# Patient Record
Sex: Male | Born: 1955
Health system: Southern US, Community
[De-identification: ages and names within clinical notes are randomized; demographics above are authoritative.]

## PROBLEM LIST (undated history)

## (undated) DIAGNOSIS — Z930 Tracheostomy status: Secondary | ICD-10-CM

## (undated) DIAGNOSIS — Z9289 Personal history of other medical treatment: Secondary | ICD-10-CM

## (undated) DIAGNOSIS — G8194 Hemiplegia, unspecified affecting left nondominant side: Secondary | ICD-10-CM

## (undated) DIAGNOSIS — W19XXXA Unspecified fall, initial encounter: Secondary | ICD-10-CM

## (undated) DIAGNOSIS — I513 Intracardiac thrombosis, not elsewhere classified: Secondary | ICD-10-CM

## (undated) DIAGNOSIS — R1310 Dysphagia, unspecified: Secondary | ICD-10-CM

## (undated) DIAGNOSIS — I5022 Chronic systolic (congestive) heart failure: Secondary | ICD-10-CM

## (undated) DIAGNOSIS — F419 Anxiety disorder, unspecified: Secondary | ICD-10-CM

## (undated) DIAGNOSIS — E46 Unspecified protein-calorie malnutrition: Secondary | ICD-10-CM

## (undated) DIAGNOSIS — E119 Type 2 diabetes mellitus without complications: Secondary | ICD-10-CM

## (undated) DIAGNOSIS — B1920 Unspecified viral hepatitis C without hepatic coma: Secondary | ICD-10-CM

## (undated) DIAGNOSIS — F329 Major depressive disorder, single episode, unspecified: Secondary | ICD-10-CM

## (undated) DIAGNOSIS — I10 Essential (primary) hypertension: Secondary | ICD-10-CM

## (undated) DIAGNOSIS — D649 Anemia, unspecified: Secondary | ICD-10-CM

## (undated) DIAGNOSIS — J9600 Acute respiratory failure, unspecified whether with hypoxia or hypercapnia: Secondary | ICD-10-CM

## (undated) DIAGNOSIS — I639 Cerebral infarction, unspecified: Secondary | ICD-10-CM

## (undated) DIAGNOSIS — J42 Unspecified chronic bronchitis: Secondary | ICD-10-CM

## (undated) DIAGNOSIS — I219 Acute myocardial infarction, unspecified: Secondary | ICD-10-CM

## (undated) DIAGNOSIS — J9690 Respiratory failure, unspecified, unspecified whether with hypoxia or hypercapnia: Secondary | ICD-10-CM

## (undated) DIAGNOSIS — E785 Hyperlipidemia, unspecified: Secondary | ICD-10-CM

## (undated) DIAGNOSIS — Z8739 Personal history of other diseases of the musculoskeletal system and connective tissue: Secondary | ICD-10-CM

## (undated) DIAGNOSIS — I255 Ischemic cardiomyopathy: Secondary | ICD-10-CM

## (undated) DIAGNOSIS — Z9981 Dependence on supplemental oxygen: Secondary | ICD-10-CM

## (undated) DIAGNOSIS — J189 Pneumonia, unspecified organism: Secondary | ICD-10-CM

## (undated) DIAGNOSIS — F32A Depression, unspecified: Secondary | ICD-10-CM

## (undated) DIAGNOSIS — F0390 Unspecified dementia without behavioral disturbance: Secondary | ICD-10-CM

## (undated) DIAGNOSIS — S3600XA Unspecified injury of spleen, initial encounter: Secondary | ICD-10-CM

## (undated) DIAGNOSIS — I2510 Atherosclerotic heart disease of native coronary artery without angina pectoris: Secondary | ICD-10-CM

## (undated) HISTORY — DX: Major depressive disorder, single episode, unspecified: F32.9

## (undated) HISTORY — PX: CARDIAC CATHETERIZATION: SHX172

## (undated) HISTORY — DX: Tracheostomy status: Z93.0

## (undated) HISTORY — DX: Unspecified protein-calorie malnutrition: E46

## (undated) HISTORY — DX: Essential (primary) hypertension: I10

## (undated) HISTORY — PX: ABDOMINAL HERNIA REPAIR: SHX539

## (undated) HISTORY — DX: Acute respiratory failure, unspecified whether with hypoxia or hypercapnia: J96.00

## (undated) HISTORY — DX: Intracardiac thrombosis, not elsewhere classified: I51.3

## (undated) HISTORY — PX: HERNIA REPAIR: SHX51

## (undated) HISTORY — DX: Depression, unspecified: F32.A

---

## 1997-10-24 HISTORY — PX: CORONARY ANGIOPLASTY WITH STENT PLACEMENT: SHX49

## 2014-11-19 DIAGNOSIS — I714 Abdominal aortic aneurysm, without rupture: Secondary | ICD-10-CM | POA: Diagnosis not present

## 2014-11-19 DIAGNOSIS — F419 Anxiety disorder, unspecified: Secondary | ICD-10-CM | POA: Diagnosis not present

## 2014-11-19 DIAGNOSIS — R1010 Upper abdominal pain, unspecified: Secondary | ICD-10-CM | POA: Diagnosis not present

## 2014-11-21 DIAGNOSIS — F17200 Nicotine dependence, unspecified, uncomplicated: Secondary | ICD-10-CM | POA: Diagnosis not present

## 2014-11-21 DIAGNOSIS — I81 Portal vein thrombosis: Secondary | ICD-10-CM | POA: Diagnosis not present

## 2014-11-21 DIAGNOSIS — B1920 Unspecified viral hepatitis C without hepatic coma: Secondary | ICD-10-CM | POA: Diagnosis not present

## 2014-11-21 DIAGNOSIS — I10 Essential (primary) hypertension: Secondary | ICD-10-CM | POA: Diagnosis not present

## 2014-11-21 DIAGNOSIS — I2510 Atherosclerotic heart disease of native coronary artery without angina pectoris: Secondary | ICD-10-CM | POA: Diagnosis not present

## 2014-11-21 DIAGNOSIS — K7460 Unspecified cirrhosis of liver: Secondary | ICD-10-CM | POA: Diagnosis not present

## 2014-11-21 DIAGNOSIS — R1013 Epigastric pain: Secondary | ICD-10-CM | POA: Diagnosis not present

## 2014-11-21 DIAGNOSIS — E118 Type 2 diabetes mellitus with unspecified complications: Secondary | ICD-10-CM | POA: Diagnosis not present

## 2014-11-21 DIAGNOSIS — I700 Atherosclerosis of aorta: Secondary | ICD-10-CM | POA: Diagnosis not present

## 2014-11-21 DIAGNOSIS — R109 Unspecified abdominal pain: Secondary | ICD-10-CM | POA: Diagnosis not present

## 2014-11-21 DIAGNOSIS — Z8619 Personal history of other infectious and parasitic diseases: Secondary | ICD-10-CM | POA: Diagnosis not present

## 2014-11-21 DIAGNOSIS — F419 Anxiety disorder, unspecified: Secondary | ICD-10-CM | POA: Diagnosis present

## 2014-11-21 DIAGNOSIS — K550 Acute vascular disorders of intestine: Secondary | ICD-10-CM | POA: Diagnosis not present

## 2014-11-21 DIAGNOSIS — Z8249 Family history of ischemic heart disease and other diseases of the circulatory system: Secondary | ICD-10-CM | POA: Diagnosis not present

## 2014-11-21 DIAGNOSIS — J449 Chronic obstructive pulmonary disease, unspecified: Secondary | ICD-10-CM | POA: Diagnosis present

## 2014-11-21 DIAGNOSIS — E119 Type 2 diabetes mellitus without complications: Secondary | ICD-10-CM | POA: Diagnosis not present

## 2014-11-21 DIAGNOSIS — Z79899 Other long term (current) drug therapy: Secondary | ICD-10-CM | POA: Diagnosis not present

## 2014-11-21 DIAGNOSIS — Z8673 Personal history of transient ischemic attack (TIA), and cerebral infarction without residual deficits: Secondary | ICD-10-CM | POA: Diagnosis not present

## 2014-11-21 DIAGNOSIS — Z7982 Long term (current) use of aspirin: Secondary | ICD-10-CM | POA: Diagnosis not present

## 2014-11-21 DIAGNOSIS — F17210 Nicotine dependence, cigarettes, uncomplicated: Secondary | ICD-10-CM | POA: Diagnosis present

## 2014-11-21 DIAGNOSIS — E785 Hyperlipidemia, unspecified: Secondary | ICD-10-CM | POA: Diagnosis not present

## 2014-11-28 DIAGNOSIS — K551 Chronic vascular disorders of intestine: Secondary | ICD-10-CM | POA: Diagnosis not present

## 2014-11-28 DIAGNOSIS — I2541 Coronary artery aneurysm: Secondary | ICD-10-CM | POA: Diagnosis not present

## 2014-12-02 DIAGNOSIS — E119 Type 2 diabetes mellitus without complications: Secondary | ICD-10-CM | POA: Diagnosis not present

## 2014-12-02 DIAGNOSIS — F39 Unspecified mood [affective] disorder: Secondary | ICD-10-CM | POA: Diagnosis not present

## 2014-12-02 DIAGNOSIS — I2510 Atherosclerotic heart disease of native coronary artery without angina pectoris: Secondary | ICD-10-CM | POA: Diagnosis not present

## 2015-01-04 DIAGNOSIS — E785 Hyperlipidemia, unspecified: Secondary | ICD-10-CM | POA: Diagnosis not present

## 2015-01-04 DIAGNOSIS — Z8619 Personal history of other infectious and parasitic diseases: Secondary | ICD-10-CM | POA: Diagnosis not present

## 2015-01-04 DIAGNOSIS — I214 Non-ST elevation (NSTEMI) myocardial infarction: Secondary | ICD-10-CM | POA: Diagnosis not present

## 2015-01-04 DIAGNOSIS — I517 Cardiomegaly: Secondary | ICD-10-CM | POA: Diagnosis not present

## 2015-01-04 DIAGNOSIS — K7460 Unspecified cirrhosis of liver: Secondary | ICD-10-CM | POA: Diagnosis not present

## 2015-01-04 DIAGNOSIS — I10 Essential (primary) hypertension: Secondary | ICD-10-CM | POA: Diagnosis not present

## 2015-01-04 DIAGNOSIS — I501 Left ventricular failure: Secondary | ICD-10-CM | POA: Diagnosis not present

## 2015-01-04 DIAGNOSIS — J449 Chronic obstructive pulmonary disease, unspecified: Secondary | ICD-10-CM | POA: Diagnosis not present

## 2015-01-04 DIAGNOSIS — Z9114 Patient's other noncompliance with medication regimen: Secondary | ICD-10-CM | POA: Diagnosis present

## 2015-01-04 DIAGNOSIS — B1920 Unspecified viral hepatitis C without hepatic coma: Secondary | ICD-10-CM | POA: Diagnosis present

## 2015-01-04 DIAGNOSIS — D696 Thrombocytopenia, unspecified: Secondary | ICD-10-CM | POA: Diagnosis present

## 2015-01-04 DIAGNOSIS — I255 Ischemic cardiomyopathy: Secondary | ICD-10-CM | POA: Diagnosis not present

## 2015-01-04 DIAGNOSIS — R079 Chest pain, unspecified: Secondary | ICD-10-CM | POA: Diagnosis not present

## 2015-01-04 DIAGNOSIS — F17210 Nicotine dependence, cigarettes, uncomplicated: Secondary | ICD-10-CM | POA: Diagnosis present

## 2015-01-04 DIAGNOSIS — R0789 Other chest pain: Secondary | ICD-10-CM | POA: Diagnosis not present

## 2015-01-04 DIAGNOSIS — I2510 Atherosclerotic heart disease of native coronary artery without angina pectoris: Secondary | ICD-10-CM | POA: Diagnosis present

## 2015-01-04 DIAGNOSIS — I252 Old myocardial infarction: Secondary | ICD-10-CM | POA: Diagnosis not present

## 2015-01-04 DIAGNOSIS — Z8673 Personal history of transient ischemic attack (TIA), and cerebral infarction without residual deficits: Secondary | ICD-10-CM | POA: Diagnosis not present

## 2015-01-04 DIAGNOSIS — I493 Ventricular premature depolarization: Secondary | ICD-10-CM | POA: Diagnosis not present

## 2015-01-16 DIAGNOSIS — R0789 Other chest pain: Secondary | ICD-10-CM | POA: Diagnosis not present

## 2015-01-16 DIAGNOSIS — I214 Non-ST elevation (NSTEMI) myocardial infarction: Secondary | ICD-10-CM | POA: Diagnosis not present

## 2015-01-16 DIAGNOSIS — R0602 Shortness of breath: Secondary | ICD-10-CM | POA: Diagnosis not present

## 2015-01-16 DIAGNOSIS — F17210 Nicotine dependence, cigarettes, uncomplicated: Secondary | ICD-10-CM | POA: Diagnosis present

## 2015-01-16 DIAGNOSIS — Z79899 Other long term (current) drug therapy: Secondary | ICD-10-CM | POA: Diagnosis not present

## 2015-01-16 DIAGNOSIS — I340 Nonrheumatic mitral (valve) insufficiency: Secondary | ICD-10-CM | POA: Diagnosis not present

## 2015-01-16 DIAGNOSIS — B1920 Unspecified viral hepatitis C without hepatic coma: Secondary | ICD-10-CM | POA: Diagnosis present

## 2015-01-16 DIAGNOSIS — I2510 Atherosclerotic heart disease of native coronary artery without angina pectoris: Secondary | ICD-10-CM | POA: Diagnosis not present

## 2015-01-16 DIAGNOSIS — Z7982 Long term (current) use of aspirin: Secondary | ICD-10-CM | POA: Diagnosis not present

## 2015-01-16 DIAGNOSIS — J449 Chronic obstructive pulmonary disease, unspecified: Secondary | ICD-10-CM | POA: Diagnosis not present

## 2015-01-16 DIAGNOSIS — E876 Hypokalemia: Secondary | ICD-10-CM | POA: Diagnosis not present

## 2015-01-16 DIAGNOSIS — I5020 Unspecified systolic (congestive) heart failure: Secondary | ICD-10-CM | POA: Diagnosis not present

## 2015-01-16 DIAGNOSIS — J9811 Atelectasis: Secondary | ICD-10-CM | POA: Diagnosis not present

## 2015-01-16 DIAGNOSIS — I517 Cardiomegaly: Secondary | ICD-10-CM | POA: Diagnosis not present

## 2015-01-16 DIAGNOSIS — I255 Ischemic cardiomyopathy: Secondary | ICD-10-CM | POA: Diagnosis not present

## 2015-01-16 DIAGNOSIS — I10 Essential (primary) hypertension: Secondary | ICD-10-CM | POA: Diagnosis not present

## 2015-01-16 DIAGNOSIS — I5022 Chronic systolic (congestive) heart failure: Secondary | ICD-10-CM | POA: Diagnosis not present

## 2015-01-16 DIAGNOSIS — Z8249 Family history of ischemic heart disease and other diseases of the circulatory system: Secondary | ICD-10-CM | POA: Diagnosis not present

## 2015-01-16 DIAGNOSIS — D696 Thrombocytopenia, unspecified: Secondary | ICD-10-CM | POA: Diagnosis not present

## 2015-01-16 DIAGNOSIS — I25119 Atherosclerotic heart disease of native coronary artery with unspecified angina pectoris: Secondary | ICD-10-CM | POA: Diagnosis not present

## 2015-01-16 DIAGNOSIS — I501 Left ventricular failure: Secondary | ICD-10-CM | POA: Diagnosis not present

## 2015-01-16 DIAGNOSIS — R079 Chest pain, unspecified: Secondary | ICD-10-CM | POA: Diagnosis not present

## 2015-01-16 DIAGNOSIS — E785 Hyperlipidemia, unspecified: Secondary | ICD-10-CM | POA: Diagnosis not present

## 2015-01-16 DIAGNOSIS — Z9861 Coronary angioplasty status: Secondary | ICD-10-CM | POA: Diagnosis not present

## 2015-01-16 DIAGNOSIS — F17200 Nicotine dependence, unspecified, uncomplicated: Secondary | ICD-10-CM | POA: Diagnosis not present

## 2015-01-28 DIAGNOSIS — F419 Anxiety disorder, unspecified: Secondary | ICD-10-CM | POA: Diagnosis not present

## 2015-01-28 DIAGNOSIS — I2510 Atherosclerotic heart disease of native coronary artery without angina pectoris: Secondary | ICD-10-CM | POA: Diagnosis not present

## 2015-01-28 DIAGNOSIS — J410 Simple chronic bronchitis: Secondary | ICD-10-CM | POA: Diagnosis not present

## 2015-01-28 DIAGNOSIS — R7309 Other abnormal glucose: Secondary | ICD-10-CM | POA: Diagnosis not present

## 2015-02-05 DIAGNOSIS — I213 ST elevation (STEMI) myocardial infarction of unspecified site: Secondary | ICD-10-CM | POA: Diagnosis not present

## 2015-02-05 DIAGNOSIS — I2583 Coronary atherosclerosis due to lipid rich plaque: Secondary | ICD-10-CM | POA: Diagnosis not present

## 2015-02-05 DIAGNOSIS — I714 Abdominal aortic aneurysm, without rupture: Secondary | ICD-10-CM | POA: Diagnosis not present

## 2015-02-05 DIAGNOSIS — E785 Hyperlipidemia, unspecified: Secondary | ICD-10-CM | POA: Diagnosis not present

## 2015-02-05 DIAGNOSIS — J449 Chronic obstructive pulmonary disease, unspecified: Secondary | ICD-10-CM | POA: Diagnosis not present

## 2015-02-05 DIAGNOSIS — Z09 Encounter for follow-up examination after completed treatment for conditions other than malignant neoplasm: Secondary | ICD-10-CM | POA: Diagnosis not present

## 2015-02-05 DIAGNOSIS — I2510 Atherosclerotic heart disease of native coronary artery without angina pectoris: Secondary | ICD-10-CM | POA: Diagnosis not present

## 2015-02-05 DIAGNOSIS — E1142 Type 2 diabetes mellitus with diabetic polyneuropathy: Secondary | ICD-10-CM | POA: Diagnosis not present

## 2015-02-05 DIAGNOSIS — I10 Essential (primary) hypertension: Secondary | ICD-10-CM | POA: Diagnosis not present

## 2015-04-29 DIAGNOSIS — R079 Chest pain, unspecified: Secondary | ICD-10-CM | POA: Diagnosis not present

## 2015-04-29 DIAGNOSIS — Z7982 Long term (current) use of aspirin: Secondary | ICD-10-CM | POA: Diagnosis not present

## 2015-04-29 DIAGNOSIS — I714 Abdominal aortic aneurysm, without rupture: Secondary | ICD-10-CM | POA: Diagnosis not present

## 2015-04-29 DIAGNOSIS — I2510 Atherosclerotic heart disease of native coronary artery without angina pectoris: Secondary | ICD-10-CM | POA: Diagnosis not present

## 2015-04-29 DIAGNOSIS — E780 Pure hypercholesterolemia: Secondary | ICD-10-CM | POA: Diagnosis not present

## 2015-06-10 DIAGNOSIS — I2510 Atherosclerotic heart disease of native coronary artery without angina pectoris: Secondary | ICD-10-CM | POA: Diagnosis not present

## 2015-06-10 DIAGNOSIS — I10 Essential (primary) hypertension: Secondary | ICD-10-CM | POA: Diagnosis not present

## 2015-06-10 DIAGNOSIS — Z720 Tobacco use: Secondary | ICD-10-CM | POA: Diagnosis not present

## 2015-06-10 DIAGNOSIS — I714 Abdominal aortic aneurysm, without rupture: Secondary | ICD-10-CM | POA: Diagnosis not present

## 2015-06-10 DIAGNOSIS — R079 Chest pain, unspecified: Secondary | ICD-10-CM | POA: Diagnosis not present

## 2015-06-10 DIAGNOSIS — B1920 Unspecified viral hepatitis C without hepatic coma: Secondary | ICD-10-CM | POA: Diagnosis not present

## 2015-06-10 DIAGNOSIS — I255 Ischemic cardiomyopathy: Secondary | ICD-10-CM | POA: Diagnosis not present

## 2015-06-10 DIAGNOSIS — E780 Pure hypercholesterolemia: Secondary | ICD-10-CM | POA: Diagnosis not present

## 2015-06-10 DIAGNOSIS — Z7982 Long term (current) use of aspirin: Secondary | ICD-10-CM | POA: Diagnosis not present

## 2015-06-10 DIAGNOSIS — R229 Localized swelling, mass and lump, unspecified: Secondary | ICD-10-CM | POA: Diagnosis not present

## 2015-06-13 DIAGNOSIS — F419 Anxiety disorder, unspecified: Secondary | ICD-10-CM | POA: Diagnosis not present

## 2015-06-13 DIAGNOSIS — E559 Vitamin D deficiency, unspecified: Secondary | ICD-10-CM | POA: Diagnosis not present

## 2015-06-13 DIAGNOSIS — Z0000 Encounter for general adult medical examination without abnormal findings: Secondary | ICD-10-CM | POA: Diagnosis not present

## 2015-06-13 DIAGNOSIS — Z79899 Other long term (current) drug therapy: Secondary | ICD-10-CM | POA: Diagnosis not present

## 2015-06-13 DIAGNOSIS — R5383 Other fatigue: Secondary | ICD-10-CM | POA: Diagnosis not present

## 2015-06-13 DIAGNOSIS — M129 Arthropathy, unspecified: Secondary | ICD-10-CM | POA: Diagnosis not present

## 2015-06-13 DIAGNOSIS — Z125 Encounter for screening for malignant neoplasm of prostate: Secondary | ICD-10-CM | POA: Diagnosis not present

## 2015-06-13 DIAGNOSIS — Z13820 Encounter for screening for osteoporosis: Secondary | ICD-10-CM | POA: Diagnosis not present

## 2015-06-13 DIAGNOSIS — G8929 Other chronic pain: Secondary | ICD-10-CM | POA: Diagnosis not present

## 2015-06-13 DIAGNOSIS — M549 Dorsalgia, unspecified: Secondary | ICD-10-CM | POA: Diagnosis not present

## 2015-06-13 DIAGNOSIS — E119 Type 2 diabetes mellitus without complications: Secondary | ICD-10-CM | POA: Diagnosis not present

## 2015-06-13 DIAGNOSIS — R0602 Shortness of breath: Secondary | ICD-10-CM | POA: Diagnosis not present

## 2015-06-13 DIAGNOSIS — F17200 Nicotine dependence, unspecified, uncomplicated: Secondary | ICD-10-CM | POA: Diagnosis not present

## 2015-06-26 DIAGNOSIS — I714 Abdominal aortic aneurysm, without rupture: Secondary | ICD-10-CM | POA: Diagnosis not present

## 2015-06-26 DIAGNOSIS — I998 Other disorder of circulatory system: Secondary | ICD-10-CM | POA: Diagnosis not present

## 2015-06-26 DIAGNOSIS — R0602 Shortness of breath: Secondary | ICD-10-CM | POA: Diagnosis not present

## 2015-06-26 DIAGNOSIS — T50901A Poisoning by unspecified drugs, medicaments and biological substances, accidental (unintentional), initial encounter: Secondary | ICD-10-CM | POA: Diagnosis not present

## 2015-06-26 DIAGNOSIS — E119 Type 2 diabetes mellitus without complications: Secondary | ICD-10-CM | POA: Diagnosis not present

## 2015-06-26 DIAGNOSIS — M1990 Unspecified osteoarthritis, unspecified site: Secondary | ICD-10-CM | POA: Diagnosis not present

## 2015-06-26 DIAGNOSIS — S0990XA Unspecified injury of head, initial encounter: Secondary | ICD-10-CM | POA: Diagnosis not present

## 2015-06-26 DIAGNOSIS — R0600 Dyspnea, unspecified: Secondary | ICD-10-CM | POA: Diagnosis not present

## 2015-06-26 DIAGNOSIS — I509 Heart failure, unspecified: Secondary | ICD-10-CM | POA: Diagnosis not present

## 2015-06-26 DIAGNOSIS — T407X1A Poisoning by cannabis (derivatives), accidental (unintentional), initial encounter: Secondary | ICD-10-CM | POA: Diagnosis not present

## 2015-06-26 DIAGNOSIS — Z8673 Personal history of transient ischemic attack (TIA), and cerebral infarction without residual deficits: Secondary | ICD-10-CM | POA: Diagnosis not present

## 2015-06-26 DIAGNOSIS — R55 Syncope and collapse: Secondary | ICD-10-CM | POA: Diagnosis not present

## 2015-06-26 DIAGNOSIS — M79672 Pain in left foot: Secondary | ICD-10-CM | POA: Diagnosis not present

## 2015-06-26 DIAGNOSIS — F419 Anxiety disorder, unspecified: Secondary | ICD-10-CM | POA: Diagnosis not present

## 2015-06-26 DIAGNOSIS — I252 Old myocardial infarction: Secondary | ICD-10-CM | POA: Diagnosis not present

## 2015-06-26 DIAGNOSIS — T424X1A Poisoning by benzodiazepines, accidental (unintentional), initial encounter: Secondary | ICD-10-CM | POA: Diagnosis not present

## 2015-06-26 DIAGNOSIS — F329 Major depressive disorder, single episode, unspecified: Secondary | ICD-10-CM | POA: Diagnosis not present

## 2015-06-26 DIAGNOSIS — I469 Cardiac arrest, cause unspecified: Secondary | ICD-10-CM | POA: Diagnosis not present

## 2015-06-26 DIAGNOSIS — E785 Hyperlipidemia, unspecified: Secondary | ICD-10-CM | POA: Diagnosis not present

## 2015-06-26 DIAGNOSIS — F17210 Nicotine dependence, cigarettes, uncomplicated: Secondary | ICD-10-CM | POA: Diagnosis not present

## 2015-06-26 DIAGNOSIS — S098XXA Other specified injuries of head, initial encounter: Secondary | ICD-10-CM | POA: Diagnosis not present

## 2015-06-26 DIAGNOSIS — Z885 Allergy status to narcotic agent status: Secondary | ICD-10-CM | POA: Diagnosis not present

## 2015-06-26 DIAGNOSIS — B1920 Unspecified viral hepatitis C without hepatic coma: Secondary | ICD-10-CM | POA: Diagnosis not present

## 2015-06-26 DIAGNOSIS — Z87820 Personal history of traumatic brain injury: Secondary | ICD-10-CM | POA: Diagnosis not present

## 2015-06-26 DIAGNOSIS — J449 Chronic obstructive pulmonary disease, unspecified: Secondary | ICD-10-CM | POA: Diagnosis not present

## 2015-06-26 DIAGNOSIS — Z955 Presence of coronary angioplasty implant and graft: Secondary | ICD-10-CM | POA: Diagnosis not present

## 2015-06-26 DIAGNOSIS — T40601A Poisoning by unspecified narcotics, accidental (unintentional), initial encounter: Secondary | ICD-10-CM | POA: Diagnosis not present

## 2015-06-26 DIAGNOSIS — W0110XA Fall on same level from slipping, tripping and stumbling with subsequent striking against unspecified object, initial encounter: Secondary | ICD-10-CM | POA: Diagnosis not present

## 2015-06-26 DIAGNOSIS — Z886 Allergy status to analgesic agent status: Secondary | ICD-10-CM | POA: Diagnosis not present

## 2015-06-26 DIAGNOSIS — I10 Essential (primary) hypertension: Secondary | ICD-10-CM | POA: Diagnosis not present

## 2015-06-26 DIAGNOSIS — E11622 Type 2 diabetes mellitus with other skin ulcer: Secondary | ICD-10-CM | POA: Diagnosis not present

## 2015-06-26 DIAGNOSIS — S60811A Abrasion of right wrist, initial encounter: Secondary | ICD-10-CM | POA: Diagnosis not present

## 2015-06-27 DIAGNOSIS — J811 Chronic pulmonary edema: Secondary | ICD-10-CM | POA: Diagnosis not present

## 2015-06-27 DIAGNOSIS — J9811 Atelectasis: Secondary | ICD-10-CM | POA: Diagnosis not present

## 2015-06-28 DIAGNOSIS — M6282 Rhabdomyolysis: Secondary | ICD-10-CM | POA: Diagnosis not present

## 2015-06-28 DIAGNOSIS — I10 Essential (primary) hypertension: Secondary | ICD-10-CM | POA: Diagnosis not present

## 2015-06-28 DIAGNOSIS — J441 Chronic obstructive pulmonary disease with (acute) exacerbation: Secondary | ICD-10-CM | POA: Diagnosis not present

## 2015-06-28 DIAGNOSIS — I5023 Acute on chronic systolic (congestive) heart failure: Secondary | ICD-10-CM | POA: Diagnosis not present

## 2015-06-29 DIAGNOSIS — J984 Other disorders of lung: Secondary | ICD-10-CM | POA: Diagnosis not present

## 2015-06-29 DIAGNOSIS — I10 Essential (primary) hypertension: Secondary | ICD-10-CM | POA: Diagnosis not present

## 2015-06-29 DIAGNOSIS — J441 Chronic obstructive pulmonary disease with (acute) exacerbation: Secondary | ICD-10-CM | POA: Diagnosis not present

## 2015-06-29 DIAGNOSIS — M6282 Rhabdomyolysis: Secondary | ICD-10-CM | POA: Diagnosis not present

## 2015-06-29 DIAGNOSIS — I5023 Acute on chronic systolic (congestive) heart failure: Secondary | ICD-10-CM | POA: Diagnosis not present

## 2015-06-29 DIAGNOSIS — R0602 Shortness of breath: Secondary | ICD-10-CM | POA: Diagnosis not present

## 2015-07-04 DIAGNOSIS — Z7901 Long term (current) use of anticoagulants: Secondary | ICD-10-CM | POA: Diagnosis not present

## 2015-07-04 DIAGNOSIS — J449 Chronic obstructive pulmonary disease, unspecified: Secondary | ICD-10-CM | POA: Diagnosis not present

## 2015-07-04 DIAGNOSIS — F17210 Nicotine dependence, cigarettes, uncomplicated: Secondary | ICD-10-CM | POA: Diagnosis not present

## 2015-07-04 DIAGNOSIS — Z885 Allergy status to narcotic agent status: Secondary | ICD-10-CM | POA: Diagnosis not present

## 2015-07-04 DIAGNOSIS — B1920 Unspecified viral hepatitis C without hepatic coma: Secondary | ICD-10-CM | POA: Diagnosis not present

## 2015-07-04 DIAGNOSIS — Z8673 Personal history of transient ischemic attack (TIA), and cerebral infarction without residual deficits: Secondary | ICD-10-CM | POA: Diagnosis not present

## 2015-07-04 DIAGNOSIS — Z7951 Long term (current) use of inhaled steroids: Secondary | ICD-10-CM | POA: Diagnosis not present

## 2015-07-04 DIAGNOSIS — Z888 Allergy status to other drugs, medicaments and biological substances status: Secondary | ICD-10-CM | POA: Diagnosis not present

## 2015-07-04 DIAGNOSIS — S99922A Unspecified injury of left foot, initial encounter: Secondary | ICD-10-CM | POA: Diagnosis not present

## 2015-07-04 DIAGNOSIS — I10 Essential (primary) hypertension: Secondary | ICD-10-CM | POA: Diagnosis not present

## 2015-07-04 DIAGNOSIS — Z7982 Long term (current) use of aspirin: Secondary | ICD-10-CM | POA: Diagnosis not present

## 2015-07-04 DIAGNOSIS — F329 Major depressive disorder, single episode, unspecified: Secondary | ICD-10-CM | POA: Diagnosis not present

## 2015-07-04 DIAGNOSIS — Z79899 Other long term (current) drug therapy: Secondary | ICD-10-CM | POA: Diagnosis not present

## 2015-07-04 DIAGNOSIS — I714 Abdominal aortic aneurysm, without rupture: Secondary | ICD-10-CM | POA: Diagnosis not present

## 2015-07-04 DIAGNOSIS — R918 Other nonspecific abnormal finding of lung field: Secondary | ICD-10-CM | POA: Diagnosis not present

## 2015-07-04 DIAGNOSIS — X58XXXA Exposure to other specified factors, initial encounter: Secondary | ICD-10-CM | POA: Diagnosis not present

## 2015-07-04 DIAGNOSIS — M79672 Pain in left foot: Secondary | ICD-10-CM | POA: Diagnosis not present

## 2015-07-04 DIAGNOSIS — S92352S Displaced fracture of fifth metatarsal bone, left foot, sequela: Secondary | ICD-10-CM | POA: Diagnosis not present

## 2015-07-04 DIAGNOSIS — I998 Other disorder of circulatory system: Secondary | ICD-10-CM | POA: Diagnosis not present

## 2015-07-04 DIAGNOSIS — I252 Old myocardial infarction: Secondary | ICD-10-CM | POA: Diagnosis not present

## 2015-07-04 DIAGNOSIS — E119 Type 2 diabetes mellitus without complications: Secondary | ICD-10-CM | POA: Diagnosis not present

## 2015-07-04 DIAGNOSIS — J45909 Unspecified asthma, uncomplicated: Secondary | ICD-10-CM | POA: Diagnosis not present

## 2015-07-04 DIAGNOSIS — Z955 Presence of coronary angioplasty implant and graft: Secondary | ICD-10-CM | POA: Diagnosis not present

## 2015-07-04 DIAGNOSIS — S20219A Contusion of unspecified front wall of thorax, initial encounter: Secondary | ICD-10-CM | POA: Diagnosis not present

## 2015-07-04 DIAGNOSIS — R079 Chest pain, unspecified: Secondary | ICD-10-CM | POA: Diagnosis not present

## 2015-07-04 DIAGNOSIS — E785 Hyperlipidemia, unspecified: Secondary | ICD-10-CM | POA: Diagnosis not present

## 2015-07-28 DIAGNOSIS — Z09 Encounter for follow-up examination after completed treatment for conditions other than malignant neoplasm: Secondary | ICD-10-CM | POA: Diagnosis not present

## 2015-07-28 DIAGNOSIS — Z8701 Personal history of pneumonia (recurrent): Secondary | ICD-10-CM | POA: Diagnosis not present

## 2015-07-28 DIAGNOSIS — I10 Essential (primary) hypertension: Secondary | ICD-10-CM | POA: Diagnosis not present

## 2015-07-28 DIAGNOSIS — I2583 Coronary atherosclerosis due to lipid rich plaque: Secondary | ICD-10-CM | POA: Diagnosis not present

## 2015-07-28 DIAGNOSIS — I2510 Atherosclerotic heart disease of native coronary artery without angina pectoris: Secondary | ICD-10-CM | POA: Diagnosis not present

## 2015-09-19 DIAGNOSIS — R0602 Shortness of breath: Secondary | ICD-10-CM | POA: Diagnosis not present

## 2015-09-19 DIAGNOSIS — I519 Heart disease, unspecified: Secondary | ICD-10-CM | POA: Diagnosis not present

## 2015-09-19 DIAGNOSIS — E0811 Diabetes mellitus due to underlying condition with ketoacidosis with coma: Secondary | ICD-10-CM | POA: Diagnosis not present

## 2015-09-19 DIAGNOSIS — I255 Ischemic cardiomyopathy: Secondary | ICD-10-CM | POA: Diagnosis not present

## 2015-09-19 DIAGNOSIS — I2510 Atherosclerotic heart disease of native coronary artery without angina pectoris: Secondary | ICD-10-CM | POA: Diagnosis not present

## 2015-09-19 DIAGNOSIS — J705 Respiratory conditions due to smoke inhalation: Secondary | ICD-10-CM | POA: Diagnosis not present

## 2015-09-19 DIAGNOSIS — I472 Ventricular tachycardia: Secondary | ICD-10-CM | POA: Diagnosis not present

## 2015-09-19 DIAGNOSIS — I63541 Cerebral infarction due to unspecified occlusion or stenosis of right cerebellar artery: Secondary | ICD-10-CM | POA: Diagnosis not present

## 2015-09-19 DIAGNOSIS — R531 Weakness: Secondary | ICD-10-CM | POA: Diagnosis present

## 2015-09-19 DIAGNOSIS — M1990 Unspecified osteoarthritis, unspecified site: Secondary | ICD-10-CM | POA: Diagnosis present

## 2015-09-19 DIAGNOSIS — B1920 Unspecified viral hepatitis C without hepatic coma: Secondary | ICD-10-CM | POA: Diagnosis present

## 2015-09-19 DIAGNOSIS — I110 Hypertensive heart disease with heart failure: Secondary | ICD-10-CM | POA: Diagnosis not present

## 2015-09-19 DIAGNOSIS — I639 Cerebral infarction, unspecified: Secondary | ICD-10-CM | POA: Diagnosis not present

## 2015-09-19 DIAGNOSIS — Z7902 Long term (current) use of antithrombotics/antiplatelets: Secondary | ICD-10-CM | POA: Diagnosis not present

## 2015-09-19 DIAGNOSIS — F0390 Unspecified dementia without behavioral disturbance: Secondary | ICD-10-CM | POA: Diagnosis present

## 2015-09-19 DIAGNOSIS — R4182 Altered mental status, unspecified: Secondary | ICD-10-CM | POA: Diagnosis not present

## 2015-09-19 DIAGNOSIS — I2583 Coronary atherosclerosis due to lipid rich plaque: Secondary | ICD-10-CM | POA: Diagnosis not present

## 2015-09-19 DIAGNOSIS — I63341 Cerebral infarction due to thrombosis of right cerebellar artery: Secondary | ICD-10-CM | POA: Diagnosis not present

## 2015-09-19 DIAGNOSIS — I517 Cardiomegaly: Secondary | ICD-10-CM | POA: Diagnosis not present

## 2015-09-19 DIAGNOSIS — Z955 Presence of coronary angioplasty implant and graft: Secondary | ICD-10-CM | POA: Diagnosis not present

## 2015-09-19 DIAGNOSIS — I509 Heart failure, unspecified: Secondary | ICD-10-CM | POA: Diagnosis not present

## 2015-09-19 DIAGNOSIS — I252 Old myocardial infarction: Secondary | ICD-10-CM | POA: Diagnosis not present

## 2015-09-19 DIAGNOSIS — Z720 Tobacco use: Secondary | ICD-10-CM | POA: Diagnosis not present

## 2015-09-19 DIAGNOSIS — I10 Essential (primary) hypertension: Secondary | ICD-10-CM | POA: Diagnosis not present

## 2015-09-19 DIAGNOSIS — E119 Type 2 diabetes mellitus without complications: Secondary | ICD-10-CM | POA: Diagnosis present

## 2015-09-19 DIAGNOSIS — I81 Portal vein thrombosis: Secondary | ICD-10-CM | POA: Diagnosis not present

## 2015-09-19 DIAGNOSIS — E785 Hyperlipidemia, unspecified: Secondary | ICD-10-CM | POA: Diagnosis not present

## 2015-09-19 DIAGNOSIS — R4189 Other symptoms and signs involving cognitive functions and awareness: Secondary | ICD-10-CM | POA: Diagnosis present

## 2015-09-19 DIAGNOSIS — J45909 Unspecified asthma, uncomplicated: Secondary | ICD-10-CM | POA: Diagnosis present

## 2015-09-19 DIAGNOSIS — I5189 Other ill-defined heart diseases: Secondary | ICD-10-CM | POA: Diagnosis not present

## 2015-09-19 DIAGNOSIS — I638 Other cerebral infarction: Secondary | ICD-10-CM | POA: Diagnosis not present

## 2015-09-19 DIAGNOSIS — G459 Transient cerebral ischemic attack, unspecified: Secondary | ICD-10-CM | POA: Diagnosis not present

## 2015-09-19 DIAGNOSIS — Z8673 Personal history of transient ischemic attack (TIA), and cerebral infarction without residual deficits: Secondary | ICD-10-CM | POA: Diagnosis not present

## 2015-09-19 DIAGNOSIS — J449 Chronic obstructive pulmonary disease, unspecified: Secondary | ICD-10-CM | POA: Diagnosis present

## 2015-09-19 DIAGNOSIS — R69 Illness, unspecified: Secondary | ICD-10-CM | POA: Diagnosis not present

## 2015-09-19 DIAGNOSIS — F419 Anxiety disorder, unspecified: Secondary | ICD-10-CM | POA: Diagnosis present

## 2015-09-19 DIAGNOSIS — F17200 Nicotine dependence, unspecified, uncomplicated: Secondary | ICD-10-CM | POA: Diagnosis not present

## 2015-09-19 DIAGNOSIS — I340 Nonrheumatic mitral (valve) insufficiency: Secondary | ICD-10-CM | POA: Diagnosis not present

## 2015-09-19 DIAGNOSIS — R29810 Facial weakness: Secondary | ICD-10-CM | POA: Diagnosis not present

## 2015-09-19 DIAGNOSIS — F329 Major depressive disorder, single episode, unspecified: Secondary | ICD-10-CM | POA: Diagnosis present

## 2015-09-19 DIAGNOSIS — F17210 Nicotine dependence, cigarettes, uncomplicated: Secondary | ICD-10-CM | POA: Diagnosis present

## 2015-09-19 DIAGNOSIS — I493 Ventricular premature depolarization: Secondary | ICD-10-CM | POA: Diagnosis not present

## 2015-09-19 DIAGNOSIS — Z7982 Long term (current) use of aspirin: Secondary | ICD-10-CM | POA: Diagnosis not present

## 2015-09-19 DIAGNOSIS — Z7901 Long term (current) use of anticoagulants: Secondary | ICD-10-CM | POA: Diagnosis not present

## 2015-12-19 DIAGNOSIS — R262 Difficulty in walking, not elsewhere classified: Secondary | ICD-10-CM | POA: Diagnosis not present

## 2015-12-19 DIAGNOSIS — I639 Cerebral infarction, unspecified: Secondary | ICD-10-CM | POA: Diagnosis not present

## 2015-12-19 DIAGNOSIS — R29810 Facial weakness: Secondary | ICD-10-CM | POA: Diagnosis present

## 2015-12-19 DIAGNOSIS — J449 Chronic obstructive pulmonary disease, unspecified: Secondary | ICD-10-CM | POA: Diagnosis present

## 2015-12-19 DIAGNOSIS — I10 Essential (primary) hypertension: Secondary | ICD-10-CM | POA: Diagnosis not present

## 2015-12-19 DIAGNOSIS — Z7401 Bed confinement status: Secondary | ICD-10-CM | POA: Diagnosis not present

## 2015-12-19 DIAGNOSIS — Z955 Presence of coronary angioplasty implant and graft: Secondary | ICD-10-CM | POA: Diagnosis not present

## 2015-12-19 DIAGNOSIS — I5023 Acute on chronic systolic (congestive) heart failure: Secondary | ICD-10-CM | POA: Diagnosis not present

## 2015-12-19 DIAGNOSIS — R531 Weakness: Secondary | ICD-10-CM | POA: Diagnosis not present

## 2015-12-19 DIAGNOSIS — I2510 Atherosclerotic heart disease of native coronary artery without angina pectoris: Secondary | ICD-10-CM | POA: Diagnosis not present

## 2015-12-19 DIAGNOSIS — I69322 Dysarthria following cerebral infarction: Secondary | ICD-10-CM | POA: Diagnosis not present

## 2015-12-19 DIAGNOSIS — I509 Heart failure, unspecified: Secondary | ICD-10-CM | POA: Diagnosis not present

## 2015-12-19 DIAGNOSIS — R918 Other nonspecific abnormal finding of lung field: Secondary | ICD-10-CM | POA: Diagnosis not present

## 2015-12-19 DIAGNOSIS — R488 Other symbolic dysfunctions: Secondary | ICD-10-CM | POA: Diagnosis not present

## 2015-12-19 DIAGNOSIS — M1990 Unspecified osteoarthritis, unspecified site: Secondary | ICD-10-CM | POA: Diagnosis not present

## 2015-12-19 DIAGNOSIS — E785 Hyperlipidemia, unspecified: Secondary | ICD-10-CM | POA: Diagnosis not present

## 2015-12-19 DIAGNOSIS — Z885 Allergy status to narcotic agent status: Secondary | ICD-10-CM | POA: Diagnosis not present

## 2015-12-19 DIAGNOSIS — I672 Cerebral atherosclerosis: Secondary | ICD-10-CM | POA: Diagnosis not present

## 2015-12-19 DIAGNOSIS — Z888 Allergy status to other drugs, medicaments and biological substances status: Secondary | ICD-10-CM | POA: Diagnosis not present

## 2015-12-19 DIAGNOSIS — Z720 Tobacco use: Secondary | ICD-10-CM | POA: Diagnosis not present

## 2015-12-19 DIAGNOSIS — M6281 Muscle weakness (generalized): Secondary | ICD-10-CM | POA: Diagnosis not present

## 2015-12-19 DIAGNOSIS — F322 Major depressive disorder, single episode, severe without psychotic features: Secondary | ICD-10-CM | POA: Diagnosis not present

## 2015-12-19 DIAGNOSIS — J431 Panlobular emphysema: Secondary | ICD-10-CM | POA: Diagnosis not present

## 2015-12-19 DIAGNOSIS — M545 Low back pain: Secondary | ICD-10-CM | POA: Diagnosis not present

## 2015-12-19 DIAGNOSIS — I5189 Other ill-defined heart diseases: Secondary | ICD-10-CM | POA: Diagnosis not present

## 2015-12-19 DIAGNOSIS — B1920 Unspecified viral hepatitis C without hepatic coma: Secondary | ICD-10-CM | POA: Diagnosis present

## 2015-12-19 DIAGNOSIS — I517 Cardiomegaly: Secondary | ICD-10-CM | POA: Diagnosis not present

## 2015-12-19 DIAGNOSIS — R41841 Cognitive communication deficit: Secondary | ICD-10-CM | POA: Diagnosis not present

## 2015-12-19 DIAGNOSIS — Z79899 Other long term (current) drug therapy: Secondary | ICD-10-CM | POA: Diagnosis not present

## 2015-12-19 DIAGNOSIS — I714 Abdominal aortic aneurysm, without rupture: Secondary | ICD-10-CM | POA: Diagnosis not present

## 2015-12-19 DIAGNOSIS — I259 Chronic ischemic heart disease, unspecified: Secondary | ICD-10-CM | POA: Diagnosis not present

## 2015-12-19 DIAGNOSIS — I255 Ischemic cardiomyopathy: Secondary | ICD-10-CM | POA: Diagnosis present

## 2015-12-19 DIAGNOSIS — I519 Heart disease, unspecified: Secondary | ICD-10-CM | POA: Diagnosis not present

## 2015-12-19 DIAGNOSIS — I69239 Monoplegia of upper limb following other nontraumatic intracranial hemorrhage affecting unspecified side: Secondary | ICD-10-CM | POA: Diagnosis not present

## 2015-12-19 DIAGNOSIS — I252 Old myocardial infarction: Secondary | ICD-10-CM | POA: Diagnosis not present

## 2015-12-19 DIAGNOSIS — F0390 Unspecified dementia without behavioral disturbance: Secondary | ICD-10-CM | POA: Diagnosis present

## 2015-12-19 DIAGNOSIS — M549 Dorsalgia, unspecified: Secondary | ICD-10-CM | POA: Diagnosis present

## 2015-12-19 DIAGNOSIS — E784 Other hyperlipidemia: Secondary | ICD-10-CM | POA: Diagnosis not present

## 2015-12-19 DIAGNOSIS — F17210 Nicotine dependence, cigarettes, uncomplicated: Secondary | ICD-10-CM | POA: Diagnosis present

## 2015-12-19 DIAGNOSIS — E1121 Type 2 diabetes mellitus with diabetic nephropathy: Secondary | ICD-10-CM | POA: Diagnosis present

## 2015-12-19 DIAGNOSIS — Z7982 Long term (current) use of aspirin: Secondary | ICD-10-CM | POA: Diagnosis not present

## 2015-12-19 DIAGNOSIS — J811 Chronic pulmonary edema: Secondary | ICD-10-CM | POA: Diagnosis not present

## 2015-12-19 DIAGNOSIS — R2689 Other abnormalities of gait and mobility: Secondary | ICD-10-CM | POA: Diagnosis not present

## 2015-12-19 DIAGNOSIS — Z7902 Long term (current) use of antithrombotics/antiplatelets: Secondary | ICD-10-CM | POA: Diagnosis not present

## 2015-12-19 DIAGNOSIS — G8104 Flaccid hemiplegia affecting left nondominant side: Secondary | ICD-10-CM | POA: Diagnosis not present

## 2015-12-19 DIAGNOSIS — Z7901 Long term (current) use of anticoagulants: Secondary | ICD-10-CM | POA: Diagnosis not present

## 2015-12-19 DIAGNOSIS — E1142 Type 2 diabetes mellitus with diabetic polyneuropathy: Secondary | ICD-10-CM | POA: Diagnosis not present

## 2015-12-19 DIAGNOSIS — I638 Other cerebral infarction: Secondary | ICD-10-CM | POA: Diagnosis not present

## 2015-12-19 DIAGNOSIS — F419 Anxiety disorder, unspecified: Secondary | ICD-10-CM | POA: Diagnosis not present

## 2015-12-19 DIAGNOSIS — I429 Cardiomyopathy, unspecified: Secondary | ICD-10-CM | POA: Diagnosis not present

## 2015-12-19 DIAGNOSIS — F411 Generalized anxiety disorder: Secondary | ICD-10-CM | POA: Diagnosis present

## 2015-12-19 DIAGNOSIS — R1312 Dysphagia, oropharyngeal phase: Secondary | ICD-10-CM | POA: Diagnosis not present

## 2015-12-19 DIAGNOSIS — R1313 Dysphagia, pharyngeal phase: Secondary | ICD-10-CM | POA: Diagnosis present

## 2015-12-19 DIAGNOSIS — E119 Type 2 diabetes mellitus without complications: Secondary | ICD-10-CM | POA: Diagnosis not present

## 2015-12-19 HISTORY — DX: Cerebral infarction, unspecified: I63.9

## 2015-12-25 DIAGNOSIS — I638 Other cerebral infarction: Secondary | ICD-10-CM | POA: Diagnosis not present

## 2015-12-25 DIAGNOSIS — E8770 Fluid overload, unspecified: Secondary | ICD-10-CM | POA: Diagnosis not present

## 2015-12-25 DIAGNOSIS — M1990 Unspecified osteoarthritis, unspecified site: Secondary | ICD-10-CM | POA: Diagnosis not present

## 2015-12-25 DIAGNOSIS — R262 Difficulty in walking, not elsewhere classified: Secondary | ICD-10-CM | POA: Diagnosis not present

## 2015-12-25 DIAGNOSIS — Z7901 Long term (current) use of anticoagulants: Secondary | ICD-10-CM | POA: Diagnosis not present

## 2015-12-25 DIAGNOSIS — I110 Hypertensive heart disease with heart failure: Secondary | ICD-10-CM | POA: Diagnosis present

## 2015-12-25 DIAGNOSIS — J9622 Acute and chronic respiratory failure with hypercapnia: Secondary | ICD-10-CM | POA: Diagnosis not present

## 2015-12-25 DIAGNOSIS — J690 Pneumonitis due to inhalation of food and vomit: Secondary | ICD-10-CM | POA: Diagnosis not present

## 2015-12-25 DIAGNOSIS — M545 Low back pain: Secondary | ICD-10-CM | POA: Diagnosis not present

## 2015-12-25 DIAGNOSIS — I69391 Dysphagia following cerebral infarction: Secondary | ICD-10-CM | POA: Diagnosis not present

## 2015-12-25 DIAGNOSIS — S7012XA Contusion of left thigh, initial encounter: Secondary | ICD-10-CM | POA: Diagnosis not present

## 2015-12-25 DIAGNOSIS — R55 Syncope and collapse: Secondary | ICD-10-CM | POA: Diagnosis not present

## 2015-12-25 DIAGNOSIS — R9431 Abnormal electrocardiogram [ECG] [EKG]: Secondary | ICD-10-CM | POA: Diagnosis not present

## 2015-12-25 DIAGNOSIS — S59902A Unspecified injury of left elbow, initial encounter: Secondary | ICD-10-CM | POA: Diagnosis not present

## 2015-12-25 DIAGNOSIS — F419 Anxiety disorder, unspecified: Secondary | ICD-10-CM | POA: Diagnosis not present

## 2015-12-25 DIAGNOSIS — K660 Peritoneal adhesions (postprocedural) (postinfection): Secondary | ICD-10-CM | POA: Diagnosis present

## 2015-12-25 DIAGNOSIS — I255 Ischemic cardiomyopathy: Secondary | ICD-10-CM | POA: Diagnosis not present

## 2015-12-25 DIAGNOSIS — F411 Generalized anxiety disorder: Secondary | ICD-10-CM | POA: Diagnosis not present

## 2015-12-25 DIAGNOSIS — I5022 Chronic systolic (congestive) heart failure: Secondary | ICD-10-CM | POA: Diagnosis present

## 2015-12-25 DIAGNOSIS — I5023 Acute on chronic systolic (congestive) heart failure: Secondary | ICD-10-CM | POA: Diagnosis not present

## 2015-12-25 DIAGNOSIS — R2689 Other abnormalities of gait and mobility: Secondary | ICD-10-CM | POA: Diagnosis not present

## 2015-12-25 DIAGNOSIS — J9811 Atelectasis: Secondary | ICD-10-CM | POA: Diagnosis not present

## 2015-12-25 DIAGNOSIS — R918 Other nonspecific abnormal finding of lung field: Secondary | ICD-10-CM | POA: Diagnosis not present

## 2015-12-25 DIAGNOSIS — I69354 Hemiplegia and hemiparesis following cerebral infarction affecting left non-dominant side: Secondary | ICD-10-CM | POA: Diagnosis not present

## 2015-12-25 DIAGNOSIS — G8194 Hemiplegia, unspecified affecting left nondominant side: Secondary | ICD-10-CM | POA: Diagnosis not present

## 2015-12-25 DIAGNOSIS — R531 Weakness: Secondary | ICD-10-CM | POA: Diagnosis not present

## 2015-12-25 DIAGNOSIS — W19XXXA Unspecified fall, initial encounter: Secondary | ICD-10-CM | POA: Diagnosis not present

## 2015-12-25 DIAGNOSIS — J9611 Chronic respiratory failure with hypoxia: Secondary | ICD-10-CM | POA: Diagnosis not present

## 2015-12-25 DIAGNOSIS — F322 Major depressive disorder, single episode, severe without psychotic features: Secondary | ICD-10-CM | POA: Diagnosis not present

## 2015-12-25 DIAGNOSIS — J9621 Acute and chronic respiratory failure with hypoxia: Secondary | ICD-10-CM | POA: Diagnosis not present

## 2015-12-25 DIAGNOSIS — R41841 Cognitive communication deficit: Secondary | ICD-10-CM | POA: Diagnosis not present

## 2015-12-25 DIAGNOSIS — D62 Acute posthemorrhagic anemia: Secondary | ICD-10-CM | POA: Diagnosis not present

## 2015-12-25 DIAGNOSIS — R571 Hypovolemic shock: Secondary | ICD-10-CM | POA: Diagnosis not present

## 2015-12-25 DIAGNOSIS — I714 Abdominal aortic aneurysm, without rupture: Secondary | ICD-10-CM | POA: Diagnosis not present

## 2015-12-25 DIAGNOSIS — I69322 Dysarthria following cerebral infarction: Secondary | ICD-10-CM | POA: Diagnosis not present

## 2015-12-25 DIAGNOSIS — R1312 Dysphagia, oropharyngeal phase: Secondary | ICD-10-CM | POA: Diagnosis not present

## 2015-12-25 DIAGNOSIS — N179 Acute kidney failure, unspecified: Secondary | ICD-10-CM | POA: Diagnosis not present

## 2015-12-25 DIAGNOSIS — I259 Chronic ischemic heart disease, unspecified: Secondary | ICD-10-CM | POA: Diagnosis not present

## 2015-12-25 DIAGNOSIS — I10 Essential (primary) hypertension: Secondary | ICD-10-CM | POA: Diagnosis not present

## 2015-12-25 DIAGNOSIS — D649 Anemia, unspecified: Secondary | ICD-10-CM | POA: Diagnosis not present

## 2015-12-25 DIAGNOSIS — M6281 Muscle weakness (generalized): Secondary | ICD-10-CM | POA: Diagnosis not present

## 2015-12-25 DIAGNOSIS — I509 Heart failure, unspecified: Secondary | ICD-10-CM | POA: Diagnosis not present

## 2015-12-25 DIAGNOSIS — S20212A Contusion of left front wall of thorax, initial encounter: Secondary | ICD-10-CM | POA: Diagnosis present

## 2015-12-25 DIAGNOSIS — S300XXA Contusion of lower back and pelvis, initial encounter: Secondary | ICD-10-CM | POA: Diagnosis not present

## 2015-12-25 DIAGNOSIS — Z955 Presence of coronary angioplasty implant and graft: Secondary | ICD-10-CM | POA: Diagnosis not present

## 2015-12-25 DIAGNOSIS — S36039A Unspecified laceration of spleen, initial encounter: Secondary | ICD-10-CM | POA: Diagnosis present

## 2015-12-25 DIAGNOSIS — Z7401 Bed confinement status: Secondary | ICD-10-CM | POA: Diagnosis not present

## 2015-12-25 DIAGNOSIS — G8104 Flaccid hemiplegia affecting left nondominant side: Secondary | ICD-10-CM | POA: Diagnosis not present

## 2015-12-25 DIAGNOSIS — I69239 Monoplegia of upper limb following other nontraumatic intracranial hemorrhage affecting unspecified side: Secondary | ICD-10-CM | POA: Diagnosis not present

## 2015-12-25 DIAGNOSIS — J449 Chronic obstructive pulmonary disease, unspecified: Secondary | ICD-10-CM | POA: Diagnosis present

## 2015-12-25 DIAGNOSIS — S40022A Contusion of left upper arm, initial encounter: Secondary | ICD-10-CM | POA: Diagnosis not present

## 2015-12-25 DIAGNOSIS — R488 Other symbolic dysfunctions: Secondary | ICD-10-CM | POA: Diagnosis not present

## 2015-12-25 DIAGNOSIS — M7989 Other specified soft tissue disorders: Secondary | ICD-10-CM | POA: Diagnosis not present

## 2015-12-25 DIAGNOSIS — I2510 Atherosclerotic heart disease of native coronary artery without angina pectoris: Secondary | ICD-10-CM | POA: Diagnosis present

## 2015-12-25 DIAGNOSIS — E785 Hyperlipidemia, unspecified: Secondary | ICD-10-CM | POA: Diagnosis present

## 2015-12-25 DIAGNOSIS — I639 Cerebral infarction, unspecified: Secondary | ICD-10-CM | POA: Diagnosis not present

## 2015-12-25 DIAGNOSIS — G8929 Other chronic pain: Secondary | ICD-10-CM | POA: Diagnosis present

## 2015-12-25 DIAGNOSIS — I513 Intracardiac thrombosis, not elsewhere classified: Secondary | ICD-10-CM | POA: Diagnosis not present

## 2015-12-25 DIAGNOSIS — E119 Type 2 diabetes mellitus without complications: Secondary | ICD-10-CM | POA: Diagnosis present

## 2015-12-25 DIAGNOSIS — S59912A Unspecified injury of left forearm, initial encounter: Secondary | ICD-10-CM | POA: Diagnosis not present

## 2015-12-28 DIAGNOSIS — R262 Difficulty in walking, not elsewhere classified: Secondary | ICD-10-CM | POA: Diagnosis not present

## 2015-12-28 DIAGNOSIS — R2689 Other abnormalities of gait and mobility: Secondary | ICD-10-CM | POA: Diagnosis not present

## 2015-12-28 DIAGNOSIS — R41841 Cognitive communication deficit: Secondary | ICD-10-CM | POA: Diagnosis not present

## 2015-12-28 DIAGNOSIS — R1312 Dysphagia, oropharyngeal phase: Secondary | ICD-10-CM | POA: Diagnosis not present

## 2015-12-28 DIAGNOSIS — I69322 Dysarthria following cerebral infarction: Secondary | ICD-10-CM | POA: Diagnosis not present

## 2015-12-28 DIAGNOSIS — M6281 Muscle weakness (generalized): Secondary | ICD-10-CM | POA: Diagnosis not present

## 2015-12-28 DIAGNOSIS — G8104 Flaccid hemiplegia affecting left nondominant side: Secondary | ICD-10-CM | POA: Diagnosis not present

## 2015-12-28 DIAGNOSIS — I639 Cerebral infarction, unspecified: Secondary | ICD-10-CM | POA: Diagnosis not present

## 2016-01-04 DIAGNOSIS — S59902A Unspecified injury of left elbow, initial encounter: Secondary | ICD-10-CM | POA: Diagnosis not present

## 2016-01-04 DIAGNOSIS — D62 Acute posthemorrhagic anemia: Secondary | ICD-10-CM | POA: Diagnosis not present

## 2016-01-04 DIAGNOSIS — M7989 Other specified soft tissue disorders: Secondary | ICD-10-CM | POA: Diagnosis not present

## 2016-01-04 DIAGNOSIS — D649 Anemia, unspecified: Secondary | ICD-10-CM | POA: Diagnosis not present

## 2016-01-04 DIAGNOSIS — R55 Syncope and collapse: Secondary | ICD-10-CM | POA: Diagnosis not present

## 2016-01-04 DIAGNOSIS — J9622 Acute and chronic respiratory failure with hypercapnia: Secondary | ICD-10-CM | POA: Diagnosis not present

## 2016-01-04 DIAGNOSIS — R9431 Abnormal electrocardiogram [ECG] [EKG]: Secondary | ICD-10-CM | POA: Diagnosis not present

## 2016-01-04 DIAGNOSIS — J9621 Acute and chronic respiratory failure with hypoxia: Secondary | ICD-10-CM | POA: Diagnosis not present

## 2016-01-04 DIAGNOSIS — S36039A Unspecified laceration of spleen, initial encounter: Secondary | ICD-10-CM | POA: Diagnosis not present

## 2016-01-04 DIAGNOSIS — R571 Hypovolemic shock: Secondary | ICD-10-CM | POA: Diagnosis not present

## 2016-01-04 DIAGNOSIS — J690 Pneumonitis due to inhalation of food and vomit: Secondary | ICD-10-CM | POA: Diagnosis not present

## 2016-01-04 DIAGNOSIS — S59912A Unspecified injury of left forearm, initial encounter: Secondary | ICD-10-CM | POA: Diagnosis not present

## 2016-01-05 DIAGNOSIS — E119 Type 2 diabetes mellitus without complications: Secondary | ICD-10-CM | POA: Diagnosis present

## 2016-01-05 DIAGNOSIS — J9611 Chronic respiratory failure with hypoxia: Secondary | ICD-10-CM | POA: Diagnosis not present

## 2016-01-05 DIAGNOSIS — J690 Pneumonitis due to inhalation of food and vomit: Secondary | ICD-10-CM | POA: Diagnosis not present

## 2016-01-05 DIAGNOSIS — G8929 Other chronic pain: Secondary | ICD-10-CM | POA: Diagnosis present

## 2016-01-05 DIAGNOSIS — S36039A Unspecified laceration of spleen, initial encounter: Secondary | ICD-10-CM | POA: Diagnosis not present

## 2016-01-05 DIAGNOSIS — I513 Intracardiac thrombosis, not elsewhere classified: Secondary | ICD-10-CM | POA: Diagnosis not present

## 2016-01-05 DIAGNOSIS — S7012XA Contusion of left thigh, initial encounter: Secondary | ICD-10-CM | POA: Diagnosis present

## 2016-01-05 DIAGNOSIS — R571 Hypovolemic shock: Secondary | ICD-10-CM | POA: Diagnosis not present

## 2016-01-05 DIAGNOSIS — Z955 Presence of coronary angioplasty implant and graft: Secondary | ICD-10-CM | POA: Diagnosis not present

## 2016-01-05 DIAGNOSIS — I255 Ischemic cardiomyopathy: Secondary | ICD-10-CM | POA: Diagnosis not present

## 2016-01-05 DIAGNOSIS — J9621 Acute and chronic respiratory failure with hypoxia: Secondary | ICD-10-CM | POA: Diagnosis not present

## 2016-01-05 DIAGNOSIS — Z452 Encounter for adjustment and management of vascular access device: Secondary | ICD-10-CM | POA: Diagnosis not present

## 2016-01-05 DIAGNOSIS — J449 Chronic obstructive pulmonary disease, unspecified: Secondary | ICD-10-CM | POA: Diagnosis present

## 2016-01-05 DIAGNOSIS — S300XXA Contusion of lower back and pelvis, initial encounter: Secondary | ICD-10-CM | POA: Diagnosis present

## 2016-01-05 DIAGNOSIS — R600 Localized edema: Secondary | ICD-10-CM | POA: Diagnosis not present

## 2016-01-05 DIAGNOSIS — S20212A Contusion of left front wall of thorax, initial encounter: Secondary | ICD-10-CM | POA: Diagnosis present

## 2016-01-05 DIAGNOSIS — J9622 Acute and chronic respiratory failure with hypercapnia: Secondary | ICD-10-CM | POA: Diagnosis not present

## 2016-01-05 DIAGNOSIS — J9811 Atelectasis: Secondary | ICD-10-CM | POA: Diagnosis not present

## 2016-01-05 DIAGNOSIS — R000 Tachycardia, unspecified: Secondary | ICD-10-CM | POA: Diagnosis not present

## 2016-01-05 DIAGNOSIS — W19XXXA Unspecified fall, initial encounter: Secondary | ICD-10-CM | POA: Diagnosis not present

## 2016-01-05 DIAGNOSIS — E785 Hyperlipidemia, unspecified: Secondary | ICD-10-CM | POA: Diagnosis present

## 2016-01-05 DIAGNOSIS — I69354 Hemiplegia and hemiparesis following cerebral infarction affecting left non-dominant side: Secondary | ICD-10-CM | POA: Diagnosis not present

## 2016-01-05 DIAGNOSIS — I501 Left ventricular failure: Secondary | ICD-10-CM | POA: Diagnosis not present

## 2016-01-05 DIAGNOSIS — N179 Acute kidney failure, unspecified: Secondary | ICD-10-CM | POA: Diagnosis not present

## 2016-01-05 DIAGNOSIS — R1312 Dysphagia, oropharyngeal phase: Secondary | ICD-10-CM | POA: Diagnosis not present

## 2016-01-05 DIAGNOSIS — E8770 Fluid overload, unspecified: Secondary | ICD-10-CM | POA: Diagnosis not present

## 2016-01-05 DIAGNOSIS — I110 Hypertensive heart disease with heart failure: Secondary | ICD-10-CM | POA: Diagnosis present

## 2016-01-05 DIAGNOSIS — I6339 Cerebral infarction due to thrombosis of other cerebral artery: Secondary | ICD-10-CM | POA: Diagnosis not present

## 2016-01-05 DIAGNOSIS — I340 Nonrheumatic mitral (valve) insufficiency: Secondary | ICD-10-CM | POA: Diagnosis not present

## 2016-01-05 DIAGNOSIS — I5022 Chronic systolic (congestive) heart failure: Secondary | ICD-10-CM | POA: Diagnosis not present

## 2016-01-05 DIAGNOSIS — I950 Idiopathic hypotension: Secondary | ICD-10-CM | POA: Diagnosis not present

## 2016-01-05 DIAGNOSIS — K660 Peritoneal adhesions (postprocedural) (postinfection): Secondary | ICD-10-CM | POA: Diagnosis present

## 2016-01-05 DIAGNOSIS — D7389 Other diseases of spleen: Secondary | ICD-10-CM | POA: Diagnosis not present

## 2016-01-05 DIAGNOSIS — S40022A Contusion of left upper arm, initial encounter: Secondary | ICD-10-CM | POA: Diagnosis present

## 2016-01-05 DIAGNOSIS — E7800 Pure hypercholesterolemia, unspecified: Secondary | ICD-10-CM | POA: Diagnosis not present

## 2016-01-05 DIAGNOSIS — R0989 Other specified symptoms and signs involving the circulatory and respiratory systems: Secondary | ICD-10-CM | POA: Diagnosis not present

## 2016-01-05 DIAGNOSIS — G8194 Hemiplegia, unspecified affecting left nondominant side: Secondary | ICD-10-CM | POA: Diagnosis not present

## 2016-01-05 DIAGNOSIS — I959 Hypotension, unspecified: Secondary | ICD-10-CM | POA: Diagnosis not present

## 2016-01-05 DIAGNOSIS — J95821 Acute postprocedural respiratory failure: Secondary | ICD-10-CM | POA: Diagnosis not present

## 2016-01-05 DIAGNOSIS — I252 Old myocardial infarction: Secondary | ICD-10-CM | POA: Diagnosis not present

## 2016-01-05 DIAGNOSIS — I2510 Atherosclerotic heart disease of native coronary artery without angina pectoris: Secondary | ICD-10-CM | POA: Diagnosis not present

## 2016-01-05 DIAGNOSIS — D62 Acute posthemorrhagic anemia: Secondary | ICD-10-CM | POA: Diagnosis not present

## 2016-01-05 DIAGNOSIS — I69391 Dysphagia following cerebral infarction: Secondary | ICD-10-CM | POA: Diagnosis not present

## 2016-01-05 DIAGNOSIS — D649 Anemia, unspecified: Secondary | ICD-10-CM | POA: Diagnosis not present

## 2016-01-05 DIAGNOSIS — Z7901 Long term (current) use of anticoagulants: Secondary | ICD-10-CM | POA: Diagnosis not present

## 2016-01-05 DIAGNOSIS — Z8673 Personal history of transient ischemic attack (TIA), and cerebral infarction without residual deficits: Secondary | ICD-10-CM | POA: Diagnosis not present

## 2016-01-05 DIAGNOSIS — R0602 Shortness of breath: Secondary | ICD-10-CM | POA: Diagnosis not present

## 2016-01-07 HISTORY — PX: OTHER SURGICAL HISTORY: SHX169

## 2016-01-13 ENCOUNTER — Other Ambulatory Visit (HOSPITAL_COMMUNITY): Payer: Self-pay

## 2016-01-13 ENCOUNTER — Inpatient Hospital Stay
Admission: AD | Admit: 2016-01-13 | Discharge: 2016-03-09 | Disposition: A | Discharge status: Skilled Nursing Facility | Payer: Self-pay | Source: Ambulatory Visit | Attending: Internal Medicine | Admitting: Internal Medicine

## 2016-01-13 DIAGNOSIS — F329 Major depressive disorder, single episode, unspecified: Secondary | ICD-10-CM | POA: Diagnosis present

## 2016-01-13 DIAGNOSIS — J9620 Acute and chronic respiratory failure, unspecified whether with hypoxia or hypercapnia: Secondary | ICD-10-CM | POA: Diagnosis not present

## 2016-01-13 DIAGNOSIS — A4102 Sepsis due to Methicillin resistant Staphylococcus aureus: Secondary | ICD-10-CM | POA: Diagnosis not present

## 2016-01-13 DIAGNOSIS — Z6827 Body mass index (BMI) 27.0-27.9, adult: Secondary | ICD-10-CM | POA: Diagnosis not present

## 2016-01-13 DIAGNOSIS — I429 Cardiomyopathy, unspecified: Secondary | ICD-10-CM | POA: Diagnosis not present

## 2016-01-13 DIAGNOSIS — K1121 Acute sialoadenitis: Secondary | ICD-10-CM | POA: Diagnosis not present

## 2016-01-13 DIAGNOSIS — R1310 Dysphagia, unspecified: Secondary | ICD-10-CM | POA: Diagnosis not present

## 2016-01-13 DIAGNOSIS — J95822 Acute and chronic postprocedural respiratory failure: Secondary | ICD-10-CM | POA: Diagnosis not present

## 2016-01-13 DIAGNOSIS — J189 Pneumonia, unspecified organism: Secondary | ICD-10-CM

## 2016-01-13 DIAGNOSIS — I6930 Unspecified sequelae of cerebral infarction: Secondary | ICD-10-CM | POA: Diagnosis not present

## 2016-01-13 DIAGNOSIS — Z931 Gastrostomy status: Secondary | ICD-10-CM

## 2016-01-13 DIAGNOSIS — I6789 Other cerebrovascular disease: Secondary | ICD-10-CM | POA: Diagnosis not present

## 2016-01-13 DIAGNOSIS — Z9911 Dependence on respirator [ventilator] status: Secondary | ICD-10-CM | POA: Diagnosis not present

## 2016-01-13 DIAGNOSIS — Z9989 Dependence on other enabling machines and devices: Secondary | ICD-10-CM | POA: Diagnosis not present

## 2016-01-13 DIAGNOSIS — I509 Heart failure, unspecified: Secondary | ICD-10-CM | POA: Diagnosis not present

## 2016-01-13 DIAGNOSIS — Z4659 Encounter for fitting and adjustment of other gastrointestinal appliance and device: Secondary | ICD-10-CM

## 2016-01-13 DIAGNOSIS — J9611 Chronic respiratory failure with hypoxia: Secondary | ICD-10-CM | POA: Diagnosis not present

## 2016-01-13 DIAGNOSIS — R0602 Shortness of breath: Secondary | ICD-10-CM | POA: Diagnosis not present

## 2016-01-13 DIAGNOSIS — J9690 Respiratory failure, unspecified, unspecified whether with hypoxia or hypercapnia: Secondary | ICD-10-CM | POA: Diagnosis not present

## 2016-01-13 DIAGNOSIS — K7689 Other specified diseases of liver: Secondary | ICD-10-CM | POA: Diagnosis not present

## 2016-01-13 DIAGNOSIS — I639 Cerebral infarction, unspecified: Secondary | ICD-10-CM | POA: Diagnosis not present

## 2016-01-13 DIAGNOSIS — Z8673 Personal history of transient ischemic attack (TIA), and cerebral infarction without residual deficits: Secondary | ICD-10-CM | POA: Diagnosis not present

## 2016-01-13 DIAGNOSIS — Z95828 Presence of other vascular implants and grafts: Secondary | ICD-10-CM

## 2016-01-13 DIAGNOSIS — S36039A Unspecified laceration of spleen, initial encounter: Secondary | ICD-10-CM | POA: Diagnosis not present

## 2016-01-13 DIAGNOSIS — Z930 Tracheostomy status: Secondary | ICD-10-CM | POA: Diagnosis not present

## 2016-01-13 DIAGNOSIS — Z9981 Dependence on supplemental oxygen: Secondary | ICD-10-CM | POA: Diagnosis not present

## 2016-01-13 DIAGNOSIS — E46 Unspecified protein-calorie malnutrition: Secondary | ICD-10-CM | POA: Diagnosis not present

## 2016-01-13 DIAGNOSIS — K122 Cellulitis and abscess of mouth: Secondary | ICD-10-CM

## 2016-01-13 DIAGNOSIS — I82890 Acute embolism and thrombosis of other specified veins: Secondary | ICD-10-CM | POA: Diagnosis present

## 2016-01-13 DIAGNOSIS — J9600 Acute respiratory failure, unspecified whether with hypoxia or hypercapnia: Secondary | ICD-10-CM | POA: Diagnosis not present

## 2016-01-13 DIAGNOSIS — J690 Pneumonitis due to inhalation of food and vomit: Secondary | ICD-10-CM | POA: Diagnosis not present

## 2016-01-13 DIAGNOSIS — Z720 Tobacco use: Secondary | ICD-10-CM | POA: Diagnosis not present

## 2016-01-13 DIAGNOSIS — I110 Hypertensive heart disease with heart failure: Secondary | ICD-10-CM | POA: Diagnosis not present

## 2016-01-13 DIAGNOSIS — S3600XA Unspecified injury of spleen, initial encounter: Secondary | ICD-10-CM | POA: Diagnosis not present

## 2016-01-13 DIAGNOSIS — D500 Iron deficiency anemia secondary to blood loss (chronic): Secondary | ICD-10-CM | POA: Diagnosis not present

## 2016-01-13 DIAGNOSIS — J391 Other abscess of pharynx: Secondary | ICD-10-CM | POA: Diagnosis not present

## 2016-01-13 DIAGNOSIS — L0291 Cutaneous abscess, unspecified: Secondary | ICD-10-CM

## 2016-01-13 DIAGNOSIS — Z452 Encounter for adjustment and management of vascular access device: Secondary | ICD-10-CM | POA: Diagnosis not present

## 2016-01-13 DIAGNOSIS — I10 Essential (primary) hypertension: Secondary | ICD-10-CM | POA: Diagnosis not present

## 2016-01-13 DIAGNOSIS — R001 Bradycardia, unspecified: Secondary | ICD-10-CM | POA: Diagnosis not present

## 2016-01-13 DIAGNOSIS — T8189XS Other complications of procedures, not elsewhere classified, sequela: Secondary | ICD-10-CM | POA: Diagnosis not present

## 2016-01-13 DIAGNOSIS — J9622 Acute and chronic respiratory failure with hypercapnia: Secondary | ICD-10-CM | POA: Diagnosis not present

## 2016-01-13 DIAGNOSIS — J9621 Acute and chronic respiratory failure with hypoxia: Secondary | ICD-10-CM | POA: Diagnosis not present

## 2016-01-13 DIAGNOSIS — D649 Anemia, unspecified: Secondary | ICD-10-CM | POA: Diagnosis not present

## 2016-01-13 DIAGNOSIS — G9340 Encephalopathy, unspecified: Secondary | ICD-10-CM | POA: Diagnosis present

## 2016-01-13 DIAGNOSIS — Z4682 Encounter for fitting and adjustment of non-vascular catheter: Secondary | ICD-10-CM | POA: Diagnosis not present

## 2016-01-13 DIAGNOSIS — I69354 Hemiplegia and hemiparesis following cerebral infarction affecting left non-dominant side: Secondary | ICD-10-CM | POA: Diagnosis not present

## 2016-01-13 DIAGNOSIS — J9612 Chronic respiratory failure with hypercapnia: Secondary | ICD-10-CM | POA: Diagnosis not present

## 2016-01-13 DIAGNOSIS — D62 Acute posthemorrhagic anemia: Secondary | ICD-10-CM | POA: Diagnosis not present

## 2016-01-13 DIAGNOSIS — J188 Other pneumonia, unspecified organism: Secondary | ICD-10-CM | POA: Diagnosis not present

## 2016-01-13 DIAGNOSIS — G8194 Hemiplegia, unspecified affecting left nondominant side: Secondary | ICD-10-CM | POA: Diagnosis not present

## 2016-01-13 DIAGNOSIS — R220 Localized swelling, mass and lump, head: Secondary | ICD-10-CM | POA: Diagnosis not present

## 2016-01-13 DIAGNOSIS — R221 Localized swelling, mass and lump, neck: Secondary | ICD-10-CM | POA: Diagnosis not present

## 2016-01-13 DIAGNOSIS — R571 Hypovolemic shock: Secondary | ICD-10-CM | POA: Diagnosis not present

## 2016-01-14 ENCOUNTER — Other Ambulatory Visit (HOSPITAL_COMMUNITY): Payer: Self-pay

## 2016-01-14 DIAGNOSIS — R0602 Shortness of breath: Secondary | ICD-10-CM | POA: Diagnosis not present

## 2016-01-14 DIAGNOSIS — Z4682 Encounter for fitting and adjustment of non-vascular catheter: Secondary | ICD-10-CM | POA: Diagnosis not present

## 2016-01-14 LAB — TYPE AND SCREEN
ABO/RH(D): A POS
Antibody Screen: NEGATIVE

## 2016-01-14 LAB — BLOOD GAS, ARTERIAL
Acid-Base Excess: 3.9 mmol/L — ABNORMAL HIGH (ref 0.0–2.0)
BICARBONATE: 27.9 meq/L — AB (ref 20.0–24.0)
DRAWN BY: 27052
Delivery systems: POSITIVE
EXPIRATORY PAP: 6
FIO2: 0.48
Inspiratory PAP: 12
O2 Saturation: 99.2 %
PATIENT TEMPERATURE: 98.6
PH ART: 7.437 (ref 7.350–7.450)
PO2 ART: 133 mmHg — AB (ref 80.0–100.0)
RATE: 8 resp/min
TCO2: 29.2 mmol/L (ref 0–100)
pCO2 arterial: 42 mmHg (ref 35.0–45.0)

## 2016-01-14 LAB — CBC
HCT: 31 % — ABNORMAL LOW (ref 39.0–52.0)
Hemoglobin: 9.6 g/dL — ABNORMAL LOW (ref 13.0–17.0)
MCH: 30.8 pg (ref 26.0–34.0)
MCHC: 31 g/dL (ref 30.0–36.0)
MCV: 99.4 fL (ref 78.0–100.0)
Platelets: 331 10*3/uL (ref 150–400)
RBC: 3.12 MIL/uL — ABNORMAL LOW (ref 4.22–5.81)
RDW: 17.8 % — ABNORMAL HIGH (ref 11.5–15.5)
WBC: 10.7 10*3/uL — ABNORMAL HIGH (ref 4.0–10.5)

## 2016-01-14 LAB — BASIC METABOLIC PANEL
Anion gap: 10 (ref 5–15)
BUN: 43 mg/dL — ABNORMAL HIGH (ref 6–20)
CO2: 27 mmol/L (ref 22–32)
Calcium: 8.6 mg/dL — ABNORMAL LOW (ref 8.9–10.3)
Chloride: 111 mmol/L (ref 101–111)
Creatinine, Ser: 1.1 mg/dL (ref 0.61–1.24)
GFR calc Af Amer: >60 mL/min (ref >60)
GFR calc non Af Amer: >60 mL/min (ref >60)
Glucose, Bld: 109 mg/dL — ABNORMAL HIGH (ref 65–99)
Potassium: 3.7 mmol/L (ref 3.5–5.1)
Sodium: 148 mmol/L — ABNORMAL HIGH (ref 135–145)

## 2016-01-14 LAB — PROTIME-INR
INR: 2.37 — ABNORMAL HIGH (ref 0.00–1.49)
Prothrombin Time: 25.6 seconds — ABNORMAL HIGH (ref 11.6–15.2)

## 2016-01-14 LAB — BRAIN NATRIURETIC PEPTIDE: B Natriuretic Peptide: 228.6 pg/mL — ABNORMAL HIGH (ref 0.0–100.0)

## 2016-01-14 LAB — TROPONIN I: Troponin I: 0.04 ng/mL — ABNORMAL HIGH (ref <0.031)

## 2016-01-14 LAB — ABO/RH: ABO/RH(D): A POS

## 2016-01-14 LAB — MAGNESIUM: Magnesium: 2.4 mg/dL (ref 1.7–2.4)

## 2016-01-14 LAB — CK: Total CK: 15 U/L — ABNORMAL LOW (ref 49–397)

## 2016-01-15 LAB — PROTIME-INR
INR: 1.53 — AB (ref 0.00–1.49)
PROTHROMBIN TIME: 18.4 s — AB (ref 11.6–15.2)

## 2016-01-16 LAB — PROTIME-INR
INR: 1.51 — ABNORMAL HIGH (ref 0.00–1.49)
Prothrombin Time: 18.3 seconds — ABNORMAL HIGH (ref 11.6–15.2)

## 2016-01-17 LAB — PROTIME-INR
INR: 1.85 — ABNORMAL HIGH (ref 0.00–1.49)
Prothrombin Time: 21.3 seconds — ABNORMAL HIGH (ref 11.6–15.2)

## 2016-01-18 LAB — PROTIME-INR
INR: 2.95 — AB (ref 0.00–1.49)
Prothrombin Time: 30.3 seconds — ABNORMAL HIGH (ref 11.6–15.2)

## 2016-01-19 LAB — PROTIME-INR
INR: 1.81 — AB (ref 0.00–1.49)
PROTHROMBIN TIME: 20.9 s — AB (ref 11.6–15.2)

## 2016-01-20 LAB — PROTIME-INR
INR: 1.47 (ref 0.00–1.49)
PROTHROMBIN TIME: 17.9 s — AB (ref 11.6–15.2)

## 2016-01-21 LAB — PROTIME-INR
INR: 1.31 (ref 0.00–1.49)
PROTHROMBIN TIME: 16.4 s — AB (ref 11.6–15.2)

## 2016-01-22 ENCOUNTER — Institutional Professional Consult (permissible substitution) (HOSPITAL_COMMUNITY): Payer: Self-pay

## 2016-01-22 ENCOUNTER — Other Ambulatory Visit (HOSPITAL_COMMUNITY): Payer: Self-pay

## 2016-01-22 DIAGNOSIS — J9690 Respiratory failure, unspecified, unspecified whether with hypoxia or hypercapnia: Secondary | ICD-10-CM | POA: Diagnosis not present

## 2016-01-22 LAB — BASIC METABOLIC PANEL
ANION GAP: 11 (ref 5–15)
BUN: 42 mg/dL — ABNORMAL HIGH (ref 6–20)
CHLORIDE: 111 mmol/L (ref 101–111)
CO2: 25 mmol/L (ref 22–32)
Calcium: 9.5 mg/dL (ref 8.9–10.3)
Creatinine, Ser: 1 mg/dL (ref 0.61–1.24)
GFR calc non Af Amer: >60 mL/min (ref >60)
Glucose, Bld: 183 mg/dL — ABNORMAL HIGH (ref 65–99)
Potassium: 4.5 mmol/L (ref 3.5–5.1)
Sodium: 147 mmol/L — ABNORMAL HIGH (ref 135–145)

## 2016-01-22 LAB — PROTIME-INR
INR: 1.5 — ABNORMAL HIGH (ref 0.00–1.49)
Prothrombin Time: 18.2 seconds — ABNORMAL HIGH (ref 11.6–15.2)

## 2016-01-23 LAB — BASIC METABOLIC PANEL
ANION GAP: 11 (ref 5–15)
BUN: 54 mg/dL — ABNORMAL HIGH (ref 6–20)
CALCIUM: 8.9 mg/dL (ref 8.9–10.3)
CO2: 27 mmol/L (ref 22–32)
Chloride: 109 mmol/L (ref 101–111)
Creatinine, Ser: 1.31 mg/dL — ABNORMAL HIGH (ref 0.61–1.24)
GFR, EST NON AFRICAN AMERICAN: 58 mL/min — AB (ref >60)
Glucose, Bld: 168 mg/dL — ABNORMAL HIGH (ref 65–99)
Potassium: 5.2 mmol/L — ABNORMAL HIGH (ref 3.5–5.1)
Sodium: 147 mmol/L — ABNORMAL HIGH (ref 135–145)

## 2016-01-23 LAB — CBC WITH DIFFERENTIAL/PLATELET
BAND NEUTROPHILS: 8 %
BASOS ABS: 0 10*3/uL (ref 0.0–0.1)
BLASTS: 0 %
Basophils Relative: 0 %
EOS ABS: 0 10*3/uL (ref 0.0–0.7)
Eosinophils Relative: 0 %
HEMATOCRIT: 41.2 % (ref 39.0–52.0)
Hemoglobin: 12.7 g/dL — ABNORMAL LOW (ref 13.0–17.0)
LYMPHS ABS: 3.2 10*3/uL (ref 0.7–4.0)
Lymphocytes Relative: 15 %
MCH: 32.2 pg (ref 26.0–34.0)
MCHC: 30.8 g/dL (ref 30.0–36.0)
MCV: 104.3 fL — ABNORMAL HIGH (ref 78.0–100.0)
METAMYELOCYTES PCT: 0 %
MONOS PCT: 14 %
MYELOCYTES: 0 %
Monocytes Absolute: 3 10*3/uL — ABNORMAL HIGH (ref 0.1–1.0)
NEUTROS ABS: 15.4 10*3/uL — AB (ref 1.7–7.7)
Neutrophils Relative %: 63 %
Other: 0 %
PLATELETS: 419 10*3/uL — AB (ref 150–400)
Promyelocytes Absolute: 0 %
RBC: 3.95 MIL/uL — ABNORMAL LOW (ref 4.22–5.81)
RDW: 20.2 % — AB (ref 11.5–15.5)
WBC: 21.6 10*3/uL — ABNORMAL HIGH (ref 4.0–10.5)
nRBC: 0 /100 WBC

## 2016-01-23 LAB — PHOSPHORUS: PHOSPHORUS: 3.4 mg/dL (ref 2.5–4.6)

## 2016-01-23 LAB — PROTIME-INR
INR: 1.46 (ref 0.00–1.49)
PROTHROMBIN TIME: 17.8 s — AB (ref 11.6–15.2)

## 2016-01-23 LAB — MAGNESIUM: Magnesium: 2.4 mg/dL (ref 1.7–2.4)

## 2016-01-24 LAB — CBC WITH DIFFERENTIAL/PLATELET
BLASTS: 0 %
Band Neutrophils: 0 %
Basophils Absolute: 0 10*3/uL (ref 0.0–0.1)
Basophils Relative: 0 %
Eosinophils Absolute: 0.2 10*3/uL (ref 0.0–0.7)
Eosinophils Relative: 1 %
HCT: 42 % (ref 39.0–52.0)
HEMOGLOBIN: 12.4 g/dL — AB (ref 13.0–17.0)
Lymphocytes Relative: 3 %
Lymphs Abs: 0.7 10*3/uL (ref 0.7–4.0)
MCH: 30.8 pg (ref 26.0–34.0)
MCHC: 29.5 g/dL — ABNORMAL LOW (ref 30.0–36.0)
MCV: 104.5 fL — AB (ref 78.0–100.0)
MONO ABS: 1.4 10*3/uL — AB (ref 0.1–1.0)
MYELOCYTES: 0 %
Metamyelocytes Relative: 0 %
Monocytes Relative: 6 %
NEUTROS PCT: 90 %
NRBC: 0 /100{WBCs}
Neutro Abs: 20.2 10*3/uL — ABNORMAL HIGH (ref 1.7–7.7)
Other: 0 %
PROMYELOCYTES ABS: 0 %
Platelets: 354 10*3/uL (ref 150–400)
RBC: 4.02 MIL/uL — AB (ref 4.22–5.81)
RDW: 20 % — ABNORMAL HIGH (ref 11.5–15.5)
WBC: 22.5 10*3/uL — AB (ref 4.0–10.5)

## 2016-01-24 LAB — RENAL FUNCTION PANEL
ALBUMIN: 1.9 g/dL — AB (ref 3.5–5.0)
Anion gap: 15 (ref 5–15)
BUN: 58 mg/dL — AB (ref 6–20)
CO2: 28 mmol/L (ref 22–32)
Calcium: 8.9 mg/dL (ref 8.9–10.3)
Chloride: 110 mmol/L (ref 101–111)
Creatinine, Ser: 1.36 mg/dL — ABNORMAL HIGH (ref 0.61–1.24)
GFR calc Af Amer: >60 mL/min (ref >60)
GFR, EST NON AFRICAN AMERICAN: 55 mL/min — AB (ref >60)
Glucose, Bld: 193 mg/dL — ABNORMAL HIGH (ref 65–99)
PHOSPHORUS: 3 mg/dL (ref 2.5–4.6)
POTASSIUM: 3.9 mmol/L (ref 3.5–5.1)
Sodium: 153 mmol/L — ABNORMAL HIGH (ref 135–145)

## 2016-01-24 LAB — PROTIME-INR
INR: 1.81 — AB (ref 0.00–1.49)
PROTHROMBIN TIME: 20.9 s — AB (ref 11.6–15.2)

## 2016-01-24 LAB — MAGNESIUM: MAGNESIUM: 2.4 mg/dL (ref 1.7–2.4)

## 2016-01-24 LAB — URINALYSIS, ROUTINE W REFLEX MICROSCOPIC
Glucose, UA: NEGATIVE mg/dL
Ketones, ur: 15 mg/dL — AB
Nitrite: NEGATIVE
Protein, ur: 100 mg/dL — AB
SPECIFIC GRAVITY, URINE: 1.026 (ref 1.005–1.030)
pH: 5 (ref 5.0–8.0)

## 2016-01-24 LAB — URINE MICROSCOPIC-ADD ON: Squamous Epithelial / LPF: NONE SEEN

## 2016-01-24 LAB — PROCALCITONIN: Procalcitonin: 4.01 ng/mL

## 2016-01-25 ENCOUNTER — Other Ambulatory Visit (HOSPITAL_COMMUNITY): Payer: Self-pay

## 2016-01-25 DIAGNOSIS — R221 Localized swelling, mass and lump, neck: Secondary | ICD-10-CM | POA: Diagnosis not present

## 2016-01-25 DIAGNOSIS — J189 Pneumonia, unspecified organism: Secondary | ICD-10-CM | POA: Diagnosis not present

## 2016-01-25 LAB — PHOSPHORUS: PHOSPHORUS: 3.2 mg/dL (ref 2.5–4.6)

## 2016-01-25 LAB — CBC WITH DIFFERENTIAL/PLATELET
Basophils Absolute: 0.1 10*3/uL (ref 0.0–0.1)
Basophils Relative: 0 %
Eosinophils Absolute: 0.4 10*3/uL (ref 0.0–0.7)
Eosinophils Relative: 2 %
HCT: 40.5 % (ref 39.0–52.0)
HEMOGLOBIN: 12.2 g/dL — AB (ref 13.0–17.0)
Lymphocytes Relative: 7 %
Lymphs Abs: 1.8 10*3/uL (ref 0.7–4.0)
MCH: 32 pg (ref 26.0–34.0)
MCHC: 30.1 g/dL (ref 30.0–36.0)
MCV: 106.3 fL — AB (ref 78.0–100.0)
MONO ABS: 2.2 10*3/uL — AB (ref 0.1–1.0)
Monocytes Relative: 9 %
NEUTROS ABS: 20.2 10*3/uL — AB (ref 1.7–7.7)
Neutrophils Relative %: 82 %
Platelets: 346 10*3/uL (ref 150–400)
RBC: 3.81 MIL/uL — ABNORMAL LOW (ref 4.22–5.81)
RDW: 19.9 % — ABNORMAL HIGH (ref 11.5–15.5)
WBC: 24.6 10*3/uL — AB (ref 4.0–10.5)

## 2016-01-25 LAB — COMPREHENSIVE METABOLIC PANEL
ALK PHOS: 118 U/L (ref 38–126)
ALT: 34 U/L (ref 17–63)
ANION GAP: 11 (ref 5–15)
AST: 47 U/L — ABNORMAL HIGH (ref 15–41)
Albumin: 1.7 g/dL — ABNORMAL LOW (ref 3.5–5.0)
BILIRUBIN TOTAL: 1.2 mg/dL (ref 0.3–1.2)
BUN: 51 mg/dL — ABNORMAL HIGH (ref 6–20)
CALCIUM: 9.1 mg/dL (ref 8.9–10.3)
CO2: 33 mmol/L — AB (ref 22–32)
CREATININE: 1.19 mg/dL (ref 0.61–1.24)
Chloride: 111 mmol/L (ref 101–111)
GFR calc non Af Amer: >60 mL/min (ref >60)
Glucose, Bld: 180 mg/dL — ABNORMAL HIGH (ref 65–99)
Potassium: 3.7 mmol/L (ref 3.5–5.1)
SODIUM: 155 mmol/L — AB (ref 135–145)
TOTAL PROTEIN: 6.7 g/dL (ref 6.5–8.1)

## 2016-01-25 LAB — URINE CULTURE: Culture: 1000

## 2016-01-25 LAB — CULTURE, RESPIRATORY

## 2016-01-25 LAB — CULTURE, BLOOD (ROUTINE X 2)

## 2016-01-25 LAB — CULTURE, RESPIRATORY W GRAM STAIN

## 2016-01-25 LAB — PROTIME-INR
INR: 2.05 — AB (ref 0.00–1.49)
PROTHROMBIN TIME: 23 s — AB (ref 11.6–15.2)

## 2016-01-25 LAB — MAGNESIUM: MAGNESIUM: 2.5 mg/dL — AB (ref 1.7–2.4)

## 2016-01-25 MED ORDER — GADOBENATE DIMEGLUMINE 529 MG/ML IV SOLN
20.0000 mL | Freq: Once | INTRAVENOUS | Status: AC | PRN
  Administered 2016-01-25: 19 mL via INTRAVENOUS

## 2016-01-26 ENCOUNTER — Other Ambulatory Visit (HOSPITAL_COMMUNITY): Payer: Self-pay

## 2016-01-26 DIAGNOSIS — Z515 Encounter for palliative care: Secondary | ICD-10-CM

## 2016-01-26 DIAGNOSIS — J9601 Acute respiratory failure with hypoxia: Secondary | ICD-10-CM

## 2016-01-26 DIAGNOSIS — K1120 Sialoadenitis, unspecified: Secondary | ICD-10-CM

## 2016-01-26 DIAGNOSIS — R220 Localized swelling, mass and lump, head: Secondary | ICD-10-CM | POA: Diagnosis not present

## 2016-01-26 LAB — BASIC METABOLIC PANEL
ANION GAP: 9 (ref 5–15)
BUN: 49 mg/dL — ABNORMAL HIGH (ref 6–20)
CALCIUM: 9.3 mg/dL (ref 8.9–10.3)
CO2: 32 mmol/L (ref 22–32)
CREATININE: 1.11 mg/dL (ref 0.61–1.24)
Chloride: 118 mmol/L — ABNORMAL HIGH (ref 101–111)
Glucose, Bld: 145 mg/dL — ABNORMAL HIGH (ref 65–99)
Potassium: 4 mmol/L (ref 3.5–5.1)
SODIUM: 159 mmol/L — AB (ref 135–145)

## 2016-01-26 LAB — CBC WITH DIFFERENTIAL/PLATELET
BASOS ABS: 0.2 10*3/uL — AB (ref 0.0–0.1)
Basophils Relative: 1 %
EOS ABS: 0.5 10*3/uL (ref 0.0–0.7)
Eosinophils Relative: 3 %
HEMATOCRIT: 47 % (ref 39.0–52.0)
Hemoglobin: 14.4 g/dL (ref 13.0–17.0)
LYMPHS ABS: 1.7 10*3/uL (ref 0.7–4.0)
Lymphocytes Relative: 11 %
MCH: 32.2 pg (ref 26.0–34.0)
MCHC: 30.6 g/dL (ref 30.0–36.0)
MCV: 105.1 fL — ABNORMAL HIGH (ref 78.0–100.0)
MONO ABS: 1.7 10*3/uL — AB (ref 0.1–1.0)
Monocytes Relative: 11 %
NEUTROS PCT: 74 %
Neutro Abs: 11.2 10*3/uL — ABNORMAL HIGH (ref 1.7–7.7)
PLATELETS: 241 10*3/uL (ref 150–400)
RBC: 4.47 MIL/uL (ref 4.22–5.81)
RDW: 20.3 % — AB (ref 11.5–15.5)
WBC: 15.3 10*3/uL — AB (ref 4.0–10.5)

## 2016-01-26 LAB — PROTIME-INR
INR: 2.65 — ABNORMAL HIGH (ref 0.00–1.49)
Prothrombin Time: 27.8 seconds — ABNORMAL HIGH (ref 11.6–15.2)

## 2016-01-26 LAB — VANCOMYCIN, TROUGH: VANCOMYCIN TR: 19 ug/mL (ref 10.0–20.0)

## 2016-01-26 MED ORDER — IOPAMIDOL (ISOVUE-300) INJECTION 61%
75.0000 mL | Freq: Once | INTRAVENOUS | Status: AC | PRN
  Administered 2016-01-26: 75 mL via INTRAVENOUS

## 2016-01-26 NOTE — Consult Note (Signed)
Name: Curtis Keller MRN: [id number] DOB: 1956-01-10    ADMISSION DATE:  01/13/2016 CONSULTATION DATE:  4-4  REFERRING MD :  Hijazi   CHIEF COMPLAINT:   Acute respiratory failure with bilateral pulmonary infiltrates  BRIEF PATIENT DESCRIPTION:  60 year old male patient with prior CVA, left-sided hemiparesis, and dysphagia. Admitted to [facility] following a hospital stay where he was treated for traumatic injury which resulted in splenic laceration and ultimately require splenectomy. He's had continuous respiratory failure since his admission at the outside hospital as well as his time [facility]. Pulmonary critical care was asked to evaluate on 4/3 in regards to ongoing respiratory failure and worsening chest x-ray.  SIGNIFICANT EVENTS  3/22 admitted. At this time he was being treated for possible associated pneumonia as well as requiring cyclic BiPAP 4/3 complaining of right facial pain, as well as swelling for about one week diagnosed with acute parotitis.  4/4 pulmonary asked to evaluate  STUDIES:  MR brain and neck 4/3:Old bilateral cerebellar infarcts and old pontine infarcts. Old bilateral basal ganglia and LEFT thalamus lacunar infarcts. LEFT inferior temporal lobe encephalomalacia is likely posttraumatic. Slow flow versus occluded RIGHT vertebral artery and distal basilar artery.Acute RIGHT parotiditis with RIGHT paraspinal and neck edema which is likely reactive, less likely myositis.   HISTORY OF PRESENT ILLNESS:  [name] who resided at a nursing home prior to his last acute hospital admit.  He has a significant history of congestive heart failure, apical cardiac thrombus on Coumadin, CVA with residual left-sided weakness. During his last hospitalization he was admitted with acute blood loss anemia after suffering a syncopal event which resulted in a fall. This resulted in dramatic hematomas involving the left side with a small splenic  laceration and contusions. He ultimately required splenectomy. His hospital course was complicated by prolonged hospital stay, deconditioning, dysphasia, and aspiration pneumonia with resultant respiratory failure which required noninvasive positive pressure ventilation.[facility] further rehabilitation measures. Since his admit his course has been complicated by ongoing noninvasive ventilatory dependence, what appears to be recurrent aspiration pneumonia as well as MRSA pneumonia which was isolated by sputum culture on 3/31, and MRSA bacteremia also isolated on that date. He has since been treated with IV vancomycin as well as meropenem. Although his mental status has improved, he continues to have high oxygen requirements. Most recently  The patient had been complaining of right facial pain and swelling for about one week while in antibiotics. He was found to have right parotitis. This has has caused significant discomfort as well as inability to tolerate noninvasive positive pressure ventilation. He is now on high flow oxygen. Pulmonary critical care was asked to evaluate given his ongoing respiratory failure as well his chest x-ray demonstrating worsening airspace disease obtained on 4/3.   PAST MEDICAL HISTORY :  Prior CVA with residual left-sided weakness, dysarthria, hypertension, congestive heart failure, apical thrombus, acute blood loss anemia, lacerated spleen, recent splenectomy, dysphagia. Prior to Admission medications   Reviewed in detail   Allergies   Codeine, lisinopril, Tylenol and hydrocodone  FAMILY HISTORY:  family history is not on file. SOCIAL HISTORY:   resides at a nursing home REVIEW OF SYSTEMS:   Unable SUBJECTIVE:  Appears comfortable on high flow oxygen VITAL SIGNS:  temperature 99.3 heart rate 94 respirations 22 blood pressure 125/74 saturation is 98% on high flow oxygen.  PHYSICAL EXAMINATION: General: debilitated  and ill appearing white male.  Lying in bed. Appears much older than stated age. Neuro:  Awake, oriented x3, severely dysarthric, left-sided hemiparesis HEENT:  Right facial swelling, his mucous membranes are dry, cracked, his oral cavity has copious thick dried secretions caked over tongue. He has no jugular venous distention. Cardiovascular:  Regular irregular no murmur Lungs:  Breathing is equal, not labored, however diminished throughout Abdomen:  Abdominal incision from splenectomy is intact. He has a nasogastric tube in place, positive bowel sounds, tolerating tube feeds Musculoskeletal:  Left sided weakness Skin: warm and dry   Recent Labs Lab 01/24/16 0607 01/25/16 0901 01/26/16 1049  NA 153* 155* 159*  K 3.9 3.7 4.0  CL 110 111 118*  CO2 28 33* 32  BUN 58* 51* 49*  CREATININE 1.36* 1.19 1.11  GLUCOSE 193* 180* 145*    Recent Labs Lab 01/24/16 0607 01/25/16 0901 01/26/16 0734  HGB 12.4* 12.2* 14.4  HCT 42.0 40.5 47.0  WBC 22.5* 24.6* 15.3*  PLT 354 346 241   Ct Head W Wo Contrast  01/26/2016  CLINICAL DATA:  60 year old male with right facial swelling. Subsequent encounter. EXAM: CT HEAD WITHOUT AND WITH CONTRAST CT NECK WITH CONTRAST TECHNIQUE: Contiguous axial images were obtained from the base of the skull through the vertex without and with intravenous contrast Multidetector CT imaging of the and neck was performed using the standard protocol following the bolus administration of intravenous contrast. CONTRAST:  75mL ISOVUE-300 IOPAMIDOL (ISOVUE-300) INJECTION 61% COMPARISON:  01/25/2016 MR head and neck. 01/04/2016 head CT and cervical spine CT. FINDINGS: CT HEAD FINDINGS No intracranial hemorrhage. Remote infarcts right pons, right cerebellum, basal ganglia bilaterally and left thalamus. CT detected regions of restricted motion within the left frontal lobe and right parietal lobe not well delineated on present exam. Given the patient's surrounding infarcts, these areas  of restricted motion may represent acute infarcts however, with the below described neck infection, small abscesses could also cause a similar appearance. This can be addressed by close follow-up MR imaging if the patient has progressive mental status changes. Global atrophy without hydrocephalus. No intracranial enhancing lesion. Prominent atherosclerotic changes right vertebral artery with significant narrowing. Occluded mid aspect of the basilar artery. Internal carotid artery narrowing cavernous sinus level. Partial opacification right mastoid air cells. CT NECK FINDINGS Diffuse inflammation of the right parotid gland with inflammatory process extending into the right masseter muscle and medial pterygoid muscle. Inflammatory process borders the crossing digastric muscle. Inflammation of overlying platysmas muscle and impresses upon the anterior border of the sternocleidomastoid muscle. Inflammation extends into the right parapharyngeal space/ carotid space, right temporal fossa as well subcutaneous region. No stone seen within the region of the parotid duct. No mass detected within the inflamed parotid gland. Bilateral adenopathy greater on the right probably reactive in origin. Feeding tube is in place. Bilateral lung parenchymal changes some of which may represent scarring with superimposed infection/ edema considerations. Consolidation posterior aspect left upper lobe. Clearing of lung parenchymal changes on follow-up imaging recommended to exclude underlying mass. Carotid bifurcation calcifications. Evaluating the degree of stenosis limited by motion. Polypoid opacification maxillary sinuses. No prevertebral/ retropharyngeal abnormality. No osseous destructive lesion. IMPRESSION: CT HEAD Remote infarcts right pons, right cerebellum, basal ganglia bilaterally and left thalamus. CT detected regions of restricted motion within the left frontal lobe and right parietal lobe not well delineated on present exam.  Given the patient's surrounding infarcts, these areas of restricted motion may represent acute infarcts however, with the below described neck infection, small abscesses could  also cause a similar appearance. This can be addressed by close follow-up MR imaging if the patient has progressive mental status changes. Global atrophy without hydrocephalus. No intracranial enhancing lesion. Prominent atherosclerotic changes right vertebral artery with significant narrowing. Occluded mid aspect of the basilar artery. Internal carotid artery narrowing cavernous sinus level. Partial opacification right mastoid air cells. CT NECK Diffuse inflammation of the right parotid gland with extension of inflammatory process as described above. No deep drainable abscess. No calcified parotid duct obstructing stone noted. Bilateral adenopathy greater on the right probably reactive in origin. Bilateral lung parenchymal changes some of which may represent scarring with superimposed infection/ edema considerations. Consolidation posterior aspect left upper lobe. Clearing of lung parenchymal changes on follow-up imaging recommended to exclude underlying mass. Electronically Signed   By: Lacy Duverney M.D.   On: 01/26/2016 13:15   Ct Soft Tissue Neck W Contrast  01/26/2016  CLINICAL DATA:  61 year old male with right facial swelling. Subsequent encounter. EXAM: CT HEAD WITHOUT AND WITH CONTRAST CT NECK WITH CONTRAST TECHNIQUE: Contiguous axial images were obtained from the base of the skull through the vertex without and with intravenous contrast Multidetector CT imaging of the and neck was performed using the standard protocol following the bolus administration of intravenous contrast. CONTRAST:  75mL ISOVUE-300 IOPAMIDOL (ISOVUE-300) INJECTION 61% COMPARISON:  01/25/2016 MR head and neck. 01/04/2016 head CT and cervical spine CT. FINDINGS: CT HEAD FINDINGS No intracranial hemorrhage. Remote infarcts right pons, right cerebellum, basal  ganglia bilaterally and left thalamus. CT detected regions of restricted motion within the left frontal lobe and right parietal lobe not well delineated on present exam. Given the patient's surrounding infarcts, these areas of restricted motion may represent acute infarcts however, with the below described neck infection, small abscesses could also cause a similar appearance. This can be addressed by close follow-up MR imaging if the patient has progressive mental status changes. Global atrophy without hydrocephalus. No intracranial enhancing lesion. Prominent atherosclerotic changes right vertebral artery with significant narrowing. Occluded mid aspect of the basilar artery. Internal carotid artery narrowing cavernous sinus level. Partial opacification right mastoid air cells. CT NECK FINDINGS Diffuse inflammation of the right parotid gland with inflammatory process extending into the right masseter muscle and medial pterygoid muscle. Inflammatory process borders the crossing digastric muscle. Inflammation of overlying platysmas muscle and impresses upon the anterior border of the sternocleidomastoid muscle. Inflammation extends into the right parapharyngeal space/ carotid space, right temporal fossa as well subcutaneous region. No stone seen within the region of the parotid duct. No mass detected within the inflamed parotid gland. Bilateral adenopathy greater on the right probably reactive in origin. Feeding tube is in place. Bilateral lung parenchymal changes some of which may represent scarring with superimposed infection/ edema considerations. Consolidation posterior aspect left upper lobe. Clearing of lung parenchymal changes on follow-up imaging recommended to exclude underlying mass. Carotid bifurcation calcifications. Evaluating the degree of stenosis limited by motion. Polypoid opacification maxillary sinuses. No prevertebral/ retropharyngeal abnormality. No osseous destructive lesion. IMPRESSION: CT HEAD  Remote infarcts right pons, right cerebellum, basal ganglia bilaterally and left thalamus. CT detected regions of restricted motion within the left frontal lobe and right parietal lobe not well delineated on present exam. Given the patient's surrounding infarcts, these areas of restricted motion may represent acute infarcts however, with the below described neck infection, small abscesses could also cause a similar appearance. This can be addressed by close follow-up MR imaging if the patient has progressive mental status changes. Global atrophy  without hydrocephalus. No intracranial enhancing lesion. Prominent atherosclerotic changes right vertebral artery with significant narrowing. Occluded mid aspect of the basilar artery. Internal carotid artery narrowing cavernous sinus level. Partial opacification right mastoid air cells. CT NECK Diffuse inflammation of the right parotid gland with extension of inflammatory process as described above. No deep drainable abscess. No calcified parotid duct obstructing stone noted. Bilateral adenopathy greater on the right probably reactive in origin. Bilateral lung parenchymal changes some of which may represent scarring with superimposed infection/ edema considerations. Consolidation posterior aspect left upper lobe. Clearing of lung parenchymal changes on follow-up imaging recommended to exclude underlying mass. Electronically Signed   By: Lacy Duverney M.D.   On: 01/26/2016 13:15   Mr Laqueta Jean ZO Contrast  01/25/2016  CLINICAL DATA:  Difficulty breathing, neck swelling. EXAM: MRI HEAD WITHOUT AND WITH CONTRAST MRI NECK WITHOUT AND WITH CONTRAST TECHNIQUE: Multiplanar, multiecho pulse sequences of the brain and neck were obtained with and without intravenous contrast. CONTRAST:  19mL MULTIHANCE GADOBENATE DIMEGLUMINE 529 MG/ML IV SOLN COMPARISON:  CT head and cervical spine January 04, 2016 FINDINGS: MRI HEAD FINDINGS Round 11 mm focus of reduced diffusion LEFT centrum  semiovale. Subcentimeter focus of reduced diffusion RIGHT parietal cortex, areas demonstrate low ADC values. No susceptibility artifact to suggest hemorrhage. Old RIGHT greater than LEFT cerebellar infarcts. Old RIGHT greater than LEFT pontine infarcts. Old bilateral basal ganglia and LEFT thalamus lacunar infarcts. Patchy supratentorial white matter FLAIR T2 hyperintensities compatible with mild chronic small vessel ischemic disease. LEFT inferior temporal lobe encephalomalacia. Postcontrast images are moderately motion degraded without convincing evidence of abnormal intracranial enhancement. No abnormal extra-axial fluid collections. Loss of RIGHT vertebral artery flow void. Decreased intensity of distal basilar artery flow void. Ocular globes and orbital contents are normal. Scattered small paranasal sinus mucosal retention cysts without air-fluid levels. Mild bilateral mastoid effusions. No abnormal sellar expansion. No cerebellar tonsillar ectopia. No suspicious calvarial bone marrow signal. Patient is edentulous. MRI NECK FINDINGS Moderate to severely motion degraded examination. Pharynx/larynx: Asymmetric fullness of the RIGHT pharyngeal soft tissues without discrete mass. Extremely limited assessment of the larynx due to motion. Nasogastric tube in place. Major salivary glands: RIGHT parotid gland is enlarged, T2 bright edema. Severely motion degraded axial postcontrast imaging. Heterogeneous enhancement noted on the coronal T1 post gadolinium. Thyroid: Not distinctly identified due to patient motion. Lymph nodes: Nondiagnostic for assessment of lymphadenopathy due to motion. Soft tissues and osseous structures: Edema and enhancement of the RIGHT paraspinal soft tissues extending to the supraclavicular fossa. No suspicious osseous enhancement. IMPRESSION: MRI HEAD: Motion degraded examination. Acute small LEFT centrum semiovale and RIGHT parietal cortex infarcts. Old bilateral cerebellar infarcts and old  pontine infarcts. Old bilateral basal ganglia and LEFT thalamus lacunar infarcts. LEFT inferior temporal lobe encephalomalacia is likely posttraumatic. Slow flow versus occluded RIGHT vertebral artery and distal basilar artery. MRI NECK:  Moderate to severely motion degraded examination. Acute RIGHT parotiditis with RIGHT paraspinal and neck edema which is likely reactive, less likely myositis. Asymmetric fullness the RIGHT pharyngeal soft tissues. Patient may better tolerate CT neck with contrast for further characterization, alternatively repeat examination with sedation. Electronically Signed   By: Awilda Metro M.D.   On: 01/25/2016 03:50   Dg Chest Port 1 View  01/25/2016  CLINICAL DATA:  Pneumonia EXAM: PORTABLE CHEST 1 VIEW COMPARISON:  01/22/2016 FINDINGS: Progression of bilateral airspace disease, probably in the bases but also in the right upper lobe. No significant pleural effusion. NG tube enters the stomach  with the tip not visualized. No central venous catheter present. IMPRESSION: Progression of bilateral airspace disease. This is consistent with the clinical diagnosis of pneumonia. Pulmonary edema could have a similar appearance. Electronically Signed   By: Marlan Palau M.D.   On: 01/25/2016 07:37   Mr Neck Wo/w Cm  01/25/2016  CLINICAL DATA:  Difficulty breathing, neck swelling. EXAM: MRI HEAD WITHOUT AND WITH CONTRAST MRI NECK WITHOUT AND WITH CONTRAST TECHNIQUE: Multiplanar, multiecho pulse sequences of the brain and neck were obtained with and without intravenous contrast. CONTRAST:  19mL MULTIHANCE GADOBENATE DIMEGLUMINE 529 MG/ML IV SOLN COMPARISON:  CT head and cervical spine January 04, 2016 FINDINGS: MRI HEAD FINDINGS Round 11 mm focus of reduced diffusion LEFT centrum semiovale. Subcentimeter focus of reduced diffusion RIGHT parietal cortex, areas demonstrate low ADC values. No susceptibility artifact to suggest hemorrhage. Old RIGHT greater than LEFT cerebellar infarcts. Old RIGHT  greater than LEFT pontine infarcts. Old bilateral basal ganglia and LEFT thalamus lacunar infarcts. Patchy supratentorial white matter FLAIR T2 hyperintensities compatible with mild chronic small vessel ischemic disease. LEFT inferior temporal lobe encephalomalacia. Postcontrast images are moderately motion degraded without convincing evidence of abnormal intracranial enhancement. No abnormal extra-axial fluid collections. Loss of RIGHT vertebral artery flow void. Decreased intensity of distal basilar artery flow void. Ocular globes and orbital contents are normal. Scattered small paranasal sinus mucosal retention cysts without air-fluid levels. Mild bilateral mastoid effusions. No abnormal sellar expansion. No cerebellar tonsillar ectopia. No suspicious calvarial bone marrow signal. Patient is edentulous. MRI NECK FINDINGS Moderate to severely motion degraded examination. Pharynx/larynx: Asymmetric fullness of the RIGHT pharyngeal soft tissues without discrete mass. Extremely limited assessment of the larynx due to motion. Nasogastric tube in place. Major salivary glands: RIGHT parotid gland is enlarged, T2 bright edema. Severely motion degraded axial postcontrast imaging. Heterogeneous enhancement noted on the coronal T1 post gadolinium. Thyroid: Not distinctly identified due to patient motion. Lymph nodes: Nondiagnostic for assessment of lymphadenopathy due to motion. Soft tissues and osseous structures: Edema and enhancement of the RIGHT paraspinal soft tissues extending to the supraclavicular fossa. No suspicious osseous enhancement. IMPRESSION: MRI HEAD: Motion degraded examination. Acute small LEFT centrum semiovale and RIGHT parietal cortex infarcts. Old bilateral cerebellar infarcts and old pontine infarcts. Old bilateral basal ganglia and LEFT thalamus lacunar infarcts. LEFT inferior temporal lobe encephalomalacia is likely posttraumatic. Slow flow versus occluded RIGHT vertebral artery and distal basilar  artery. MRI NECK:  Moderate to severely motion degraded examination. Acute RIGHT parotiditis with RIGHT paraspinal and neck edema which is likely reactive, less likely myositis. Asymmetric fullness the RIGHT pharyngeal soft tissues. Patient may better tolerate CT neck with contrast for further characterization, alternatively repeat examination with sedation. Electronically Signed   By: Awilda Metro M.D.   On: 01/25/2016 03:50   Chest x-ray on 4/3 demonstrates bilateral pulmonary infiltrates these are new in comparison to prior film  ASSESSMENT / PLAN:  Acute hypoxic respiratory failure in the setting of acute on chronic aspiration Pneumonia Acute lung injury MRSA pneumonia Severe dysphagia in the setting of stroke, further exacerbated by acute parotitis Severe debilitation Protein calorie malnutrition Systolic heart failure with ejection fraction of 30% History of left atrial thrombus MRSA bacteremia Hypernatremia  Discussion: This is a severely debilitated 60 year old male now status post prolonged critical illness complicated by MRSA bacteremia and pneumonia, new acute on chronic aspiration pneumonia, and persistent respiratory failure requiring high flow oxygen, and noninvasive positive pressure ventilation. He has a new parotitis, which I suspect is a  complication of his ongoing dependence on noninvasive ventilation. His prognosis for functional recovery is dismal at best. I discussed with him twice a day of mechanical ventilation as well as CPR and alternative routes for nutrition. I informed him that I think should he require mechanical ventilation he would certainly end up requiring tracheostomy. This is not at therapy he believes that he wants. He did endorse the fact that he would be open to alternative means of nutrition for example a feeding tube for the rest of his life.  Plan/rec Maintain strict n.p.o. Status He will need vigorous oral hygiene measures Continue current  antibiotics Continue high flow oxygen Not a candidate for BiPAP Ongoing discussions with family in regards to long-term prognosis, and likelihood of respiratory failure which will eventually necessitated tracheostomy We would recommend full DO NOT RESUSCITATE, have attempted to contact family without success.  Simonne Martinet ACNP-BC [facility] Pulmonary/Critical Care Pager # [phone] OR # [phone] if no answer   01/26/2016, 1:56 PM  Attending Note:  60 year old male with extensive PMH, debilitated at baseline and now on BiPAP that caused parotitis.  His ability to protect his airway is questionable at best on exam.  I do not believe that life support would be an option for him as he does not want to have a trach therefore intubation should not be an option but that needs further discussion.  I am concerned about his respiratory status.  Would maintain him NPO with strict oral hygiene measures to combat parotitis.  Abx and high O2 are acceptable options.  If fails that then will need to discuss code status but no success in contacting family.  PCCM will follow, recommend involvement of palliative care and a full DNR status.  The patient is critically ill with multiple organ systems failure and requires high complexity decision making for assessment and support, frequent evaluation and titration of therapies, application of advanced monitoring technologies and extensive interpretation of multiple databases.   Critical Care Time devoted to patient care services described in this note is  35  Minutes. This time reflects time of care of this signee Dr Koren Bound. This critical care time does not reflect procedure time, or teaching time or supervisory time of PA/NP/Med student/Med Resident etc but could involve care discussion time.  Alyson Reedy, M.D. [facility] Pulmonary/Critical Care Medicine. Pager: [phone]. After hours pager: [phone].

## 2016-01-27 ENCOUNTER — Other Ambulatory Visit (HOSPITAL_COMMUNITY): Payer: Self-pay

## 2016-01-27 DIAGNOSIS — J9809 Other diseases of bronchus, not elsewhere classified: Secondary | ICD-10-CM

## 2016-01-27 DIAGNOSIS — Z452 Encounter for adjustment and management of vascular access device: Secondary | ICD-10-CM | POA: Diagnosis not present

## 2016-01-27 DIAGNOSIS — J84112 Idiopathic pulmonary fibrosis: Secondary | ICD-10-CM

## 2016-01-27 LAB — CBC WITH DIFFERENTIAL/PLATELET
BASOS PCT: 0 %
Basophils Absolute: 0 10*3/uL (ref 0.0–0.1)
EOS PCT: 4 %
Eosinophils Absolute: 0.5 10*3/uL (ref 0.0–0.7)
HEMATOCRIT: 39.4 % (ref 39.0–52.0)
HEMOGLOBIN: 12.2 g/dL — AB (ref 13.0–17.0)
LYMPHS PCT: 12 %
Lymphs Abs: 1.6 10*3/uL (ref 0.7–4.0)
MCH: 31.9 pg (ref 26.0–34.0)
MCHC: 31 g/dL (ref 30.0–36.0)
MCV: 102.9 fL — ABNORMAL HIGH (ref 78.0–100.0)
MONO ABS: 1.1 10*3/uL — AB (ref 0.1–1.0)
MONOS PCT: 8 %
NEUTROS PCT: 76 %
Neutro Abs: 10.4 10*3/uL — ABNORMAL HIGH (ref 1.7–7.7)
Platelets: 298 10*3/uL (ref 150–400)
RBC: 3.83 MIL/uL — ABNORMAL LOW (ref 4.22–5.81)
RDW: 19.8 % — ABNORMAL HIGH (ref 11.5–15.5)
WBC: 13.6 10*3/uL — ABNORMAL HIGH (ref 4.0–10.5)

## 2016-01-27 LAB — PROTIME-INR
INR: 2.9 — ABNORMAL HIGH (ref 0.00–1.49)
Prothrombin Time: 29.9 seconds — ABNORMAL HIGH (ref 11.6–15.2)

## 2016-01-27 LAB — BASIC METABOLIC PANEL
ANION GAP: 10 (ref 5–15)
BUN: 44 mg/dL — ABNORMAL HIGH (ref 6–20)
CHLORIDE: 114 mmol/L — AB (ref 101–111)
CO2: 27 mmol/L (ref 22–32)
Calcium: 9 mg/dL (ref 8.9–10.3)
Creatinine, Ser: 1.02 mg/dL (ref 0.61–1.24)
GFR calc non Af Amer: >60 mL/min (ref >60)
Glucose, Bld: 179 mg/dL — ABNORMAL HIGH (ref 65–99)
POTASSIUM: 4.1 mmol/L (ref 3.5–5.1)
SODIUM: 151 mmol/L — AB (ref 135–145)

## 2016-01-27 LAB — CULTURE, BLOOD (ROUTINE X 2): Culture: NO GROWTH

## 2016-01-27 NOTE — Consult Note (Unsigned)
Curtis Keller, Curtis Keller              ACCOUNT NO.:  [id number]  MEDICAL RECORD NO.:  [id number]  LOCATION:                                 FACILITY:  PHYSICIAN:  Kristine Garbe. Ezzard Standing, M.D. DATE OF BIRTH:  DATE OF CONSULTATION: DATE OF DISCHARGE:                                CONSULTATION   REASON FOR CONSULT:  Evaluate the patient with right neck and face swelling, questionable abscess, MRI was done, questionable Ludwig's angina.  On review of the MRI scan, this showed findings consistent with acute right parotitis with a diffusely enlarged right parotid gland.  No definitive abscess identified.  Had diffuse swelling of the right parotid area, right neck, and a little bit of parapharyngeal swelling.  On exam at bedside, the patient has diffuse swelling of the right parotid, right cheek area.  He has no airway problems.  Floor of mouth, submental area was soft with no significant swelling.  Has no airway difficulty, speech okay, has a nasogastric tube in place.  No airway problems presently.  IMPRESSION:  Acute right parotitis.  RECOMMENDATION:  IV antibiotics to cover parotitis which is frequent staph infection.  He is presently on vancomycin and imipramine, which should be adequate.  We will follow up with the patient later this week.    ______________________________ Kristine Garbe. Ezzard Standing, M.D.   ______________________________ Kristine Garbe. Ezzard Standing, M.D.    CEN/MEDQ  D:  01/25/2016  T:  01/26/2016  Job:  [id number]

## 2016-01-27 NOTE — Progress Notes (Signed)
Name: Curtis Keller MRN: [id number] DOB: 05-17-1956    ADMISSION DATE:  01/13/2016 CONSULTATION DATE:  4-4  REFERRING MD :  Hijazi   CHIEF COMPLAINT:   Acute respiratory failure with bilateral pulmonary infiltrates  BRIEF PATIENT DESCRIPTION:  60 year old male patient with prior CVA, left-sided hemiparesis, and dysphagia. Admitted to [facility] following a hospital stay where he was treated for traumatic injury which resulted in splenic laceration and ultimately require splenectomy. He's had continuous respiratory failure since his admission at the outside hospital as well as his time [facility]. Pulmonary critical care was asked to evaluate on 4/3 in regards to ongoing respiratory failure and worsening chest x-ray.  SIGNIFICANT EVENTS  3/22 admitted. At this time he was being treated for possible associated pneumonia as well as requiring cyclic BiPAP 4/3 complaining of right facial pain, as well as swelling for about one week diagnosed with acute parotitis.  4/4 pulmonary asked to evaluate  STUDIES:  MR brain and neck 4/3:Old bilateral cerebellar infarcts and old pontine infarcts. Old bilateral basal ganglia and LEFT thalamus lacunar infarcts. LEFT inferior temporal lobe encephalomalacia is likely posttraumatic. Slow flow versus occluded RIGHT vertebral artery and distal basilar artery.Acute RIGHT parotiditis with RIGHT paraspinal and neck edema which is likely reactive, less likely myositis.   HISTORY OF PRESENT ILLNESS:  [name] who resided at a nursing home prior to his last acute hospital admit.  He has a significant history of congestive heart failure, apical cardiac thrombus on Coumadin, CVA with residual left-sided weakness. During his last hospitalization he was admitted with acute blood loss anemia after suffering a syncopal event which resulted in a fall. This resulted in dramatic hematomas involving the left side with a small splenic  laceration and contusions. He ultimately required splenectomy. His hospital course was complicated by prolonged hospital stay, deconditioning, dysphasia, and aspiration pneumonia with resultant respiratory failure which required noninvasive positive pressure ventilation.[facility] rehabilitation measures. Since his admit his course has been complicated by ongoing noninvasive ventilatory dependence, what appears to be recurrent aspiration pneumonia as well as MRSA pneumonia which was isolated by sputum culture on 3/31, and MRSA bacteremia also isolated on that date. He has since been treated with IV vancomycin as well as meropenem. Although his mental status has improved, he continues to have high oxygen requirements. Most recently  The patient had been complaining of right facial pain and swelling for about one week while in antibiotics. He was found to have right parotitis. This has has caused significant discomfort as well as inability to tolerate noninvasive positive pressure ventilation. He is now on high flow oxygen. Pulmonary critical care was asked to evaluate given his ongoing respiratory failure as well his chest x-ray demonstrating worsening airspace disease obtained on 4/3.   SUBJECTIVE:  Appears comfortable  VITAL SIGNS:  temperature 97.6  heart rate 99 respirations 31 blood pressure 128/76 saturation is 95%  PHYSICAL EXAMINATION: General: debilitated and ill appearing white male. Lying in bed. Appears much older than stated age. Neuro:  Awake, oriented x3, severely dysarthric, left-sided hemiparesis HEENT:  Right facial swelling, his mucous membranes are dry, cracked, his oral cavity has copious thick dried secretions caked over tongue. He has no jugular venous distention. Cardiovascular:  Regular irregular no murmur Lungs:  Breathing is equal, not labored, however diminished throughout with poor air movement Abdomen:  Abdominal  incision from splenectomy is intact. He has a  nasogastric tube in place, positive bowel sounds, tolerating tube feeds Musculoskeletal:  Left sided weakness Skin: warm and dry   Recent Labs Lab 01/24/16 0607 01/25/16 0901 01/26/16 1049  NA 153* 155* 159*  K 3.9 3.7 4.0  CL 110 111 118*  CO2 28 33* 32  BUN 58* 51* 49*  CREATININE 1.36* 1.19 1.11  GLUCOSE 193* 180* 145*    Recent Labs Lab 01/25/16 0901 01/26/16 0734 01/27/16 0934  HGB 12.2* 14.4 12.2*  HCT 40.5 47.0 39.4  WBC 24.6* 15.3* PENDING  PLT 346 241 PENDING   Ct Head W Wo Contrast  01/26/2016  CLINICAL DATA:  60 year old male with right facial swelling. Subsequent encounter. EXAM: CT HEAD WITHOUT AND WITH CONTRAST CT NECK WITH CONTRAST TECHNIQUE: Contiguous axial images were obtained from the base of the skull through the vertex without and with intravenous contrast Multidetector CT imaging of the and neck was performed using the standard protocol following the bolus administration of intravenous contrast. CONTRAST:  31mL ISOVUE-300 IOPAMIDOL (ISOVUE-300) INJECTION 61% COMPARISON:  01/25/2016 MR head and neck. 01/04/2016 head CT and cervical spine CT. FINDINGS: CT HEAD FINDINGS No intracranial hemorrhage. Remote infarcts right pons, right cerebellum, basal ganglia bilaterally and left thalamus. CT detected regions of restricted motion within the left frontal lobe and right parietal lobe not well delineated on present exam. Given the patient's surrounding infarcts, these areas of restricted motion may represent acute infarcts however, with the below described neck infection, small abscesses could also cause a similar appearance. This can be addressed by close follow-up MR imaging if the patient has progressive mental status changes. Global atrophy without hydrocephalus. No intracranial enhancing lesion. Prominent atherosclerotic changes right vertebral artery with significant narrowing. Occluded mid aspect of the basilar artery.  Internal carotid artery narrowing cavernous sinus level. Partial opacification right mastoid air cells. CT NECK FINDINGS Diffuse inflammation of the right parotid gland with inflammatory process extending into the right masseter muscle and medial pterygoid muscle. Inflammatory process borders the crossing digastric muscle. Inflammation of overlying platysmas muscle and impresses upon the anterior border of the sternocleidomastoid muscle. Inflammation extends into the right parapharyngeal space/ carotid space, right temporal fossa as well subcutaneous region. No stone seen within the region of the parotid duct. No mass detected within the inflamed parotid gland. Bilateral adenopathy greater on the right probably reactive in origin. Feeding tube is in place. Bilateral lung parenchymal changes some of which may represent scarring with superimposed infection/ edema considerations. Consolidation posterior aspect left upper lobe. Clearing of lung parenchymal changes on follow-up imaging recommended to exclude underlying mass. Carotid bifurcation calcifications. Evaluating the degree of stenosis limited by motion. Polypoid opacification maxillary sinuses. No prevertebral/ retropharyngeal abnormality. No osseous destructive lesion. IMPRESSION: CT HEAD Remote infarcts right pons, right cerebellum, basal ganglia bilaterally and left thalamus. CT detected regions of restricted motion within the left frontal lobe and right parietal lobe not well delineated on present exam. Given the patient's surrounding infarcts, these areas of restricted motion may represent acute infarcts however, with the below described neck infection, small abscesses could also cause a similar appearance. This can be addressed by close follow-up MR imaging if the patient has progressive mental status changes. Global atrophy without hydrocephalus. No intracranial enhancing lesion. Prominent atherosclerotic changes right vertebral artery with significant  narrowing. Occluded mid aspect of the basilar artery. Internal carotid artery narrowing cavernous sinus level. Partial opacification right mastoid air cells. CT NECK Diffuse inflammation of the right parotid gland with extension of inflammatory process  as described above. No deep drainable abscess. No calcified parotid duct obstructing stone noted. Bilateral adenopathy greater on the right probably reactive in origin. Bilateral lung parenchymal changes some of which may represent scarring with superimposed infection/ edema considerations. Consolidation posterior aspect left upper lobe. Clearing of lung parenchymal changes on follow-up imaging recommended to exclude underlying mass. Electronically Signed   By: Lacy Duverney M.D.   On: 01/26/2016 13:15   Ct Soft Tissue Neck W Contrast  01/26/2016  CLINICAL DATA:  60 year old male with right facial swelling. Subsequent encounter. EXAM: CT HEAD WITHOUT AND WITH CONTRAST CT NECK WITH CONTRAST TECHNIQUE: Contiguous axial images were obtained from the base of the skull through the vertex without and with intravenous contrast Multidetector CT imaging of the and neck was performed using the standard protocol following the bolus administration of intravenous contrast. CONTRAST:  75mL ISOVUE-300 IOPAMIDOL (ISOVUE-300) INJECTION 61% COMPARISON:  01/25/2016 MR head and neck. 01/04/2016 head CT and cervical spine CT. FINDINGS: CT HEAD FINDINGS No intracranial hemorrhage. Remote infarcts right pons, right cerebellum, basal ganglia bilaterally and left thalamus. CT detected regions of restricted motion within the left frontal lobe and right parietal lobe not well delineated on present exam. Given the patient's surrounding infarcts, these areas of restricted motion may represent acute infarcts however, with the below described neck infection, small abscesses could also cause a similar appearance. This can be addressed by close follow-up MR imaging if the patient has progressive  mental status changes. Global atrophy without hydrocephalus. No intracranial enhancing lesion. Prominent atherosclerotic changes right vertebral artery with significant narrowing. Occluded mid aspect of the basilar artery. Internal carotid artery narrowing cavernous sinus level. Partial opacification right mastoid air cells. CT NECK FINDINGS Diffuse inflammation of the right parotid gland with inflammatory process extending into the right masseter muscle and medial pterygoid muscle. Inflammatory process borders the crossing digastric muscle. Inflammation of overlying platysmas muscle and impresses upon the anterior border of the sternocleidomastoid muscle. Inflammation extends into the right parapharyngeal space/ carotid space, right temporal fossa as well subcutaneous region. No stone seen within the region of the parotid duct. No mass detected within the inflamed parotid gland. Bilateral adenopathy greater on the right probably reactive in origin. Feeding tube is in place. Bilateral lung parenchymal changes some of which may represent scarring with superimposed infection/ edema considerations. Consolidation posterior aspect left upper lobe. Clearing of lung parenchymal changes on follow-up imaging recommended to exclude underlying mass. Carotid bifurcation calcifications. Evaluating the degree of stenosis limited by motion. Polypoid opacification maxillary sinuses. No prevertebral/ retropharyngeal abnormality. No osseous destructive lesion. IMPRESSION: CT HEAD Remote infarcts right pons, right cerebellum, basal ganglia bilaterally and left thalamus. CT detected regions of restricted motion within the left frontal lobe and right parietal lobe not well delineated on present exam. Given the patient's surrounding infarcts, these areas of restricted motion may represent acute infarcts however, with the below described neck infection, small abscesses could also cause a similar appearance. This can be addressed by close  follow-up MR imaging if the patient has progressive mental status changes. Global atrophy without hydrocephalus. No intracranial enhancing lesion. Prominent atherosclerotic changes right vertebral artery with significant narrowing. Occluded mid aspect of the basilar artery. Internal carotid artery narrowing cavernous sinus level. Partial opacification right mastoid air cells. CT NECK Diffuse inflammation of the right parotid gland with extension of inflammatory process as described above. No deep drainable abscess. No calcified parotid duct obstructing stone noted. Bilateral adenopathy greater on the right probably reactive in origin.  Bilateral lung parenchymal changes some of which may represent scarring with superimposed infection/ edema considerations. Consolidation posterior aspect left upper lobe. Clearing of lung parenchymal changes on follow-up imaging recommended to exclude underlying mass. Electronically Signed   By: Lacy Duverney M.D.   On: 01/26/2016 13:15     ASSESSMENT / PLAN:  Acute hypoxic respiratory failure in the setting of acute on chronic aspiration Pneumonia Acute lung injury MRSA pneumonia Severe dysphagia in the setting of stroke, further exacerbated by acute parotitis Severe debilitation Protein calorie malnutrition Systolic heart failure with ejection fraction of 30% History of left atrial thrombus MRSA bacteremia Hypernatremia  Discussion: This is a severely debilitated 60 year old male now status post prolonged critical illness complicated by MRSA bacteremia and pneumonia, new acute on chronic aspiration pneumonia, and persistent respiratory failure requiring high flow oxygen, and noninvasive positive pressure ventilation. He has a new parotitis, which we suspect is a complication of his ongoing dependence on noninvasive ventilation. His prognosis for functional recovery is dismal at best.  The discussion  of mechanical ventilation as well as CPR and alternative routes  for nutrition were done. If intubated he will most likely need trach/peg and LTAC which he doesn't want.  Plan/rec Maintain strict n.p.o. Status He will need vigorous oral hygiene measures Continue current antibiotics Continue high flow oxygen Not a candidate for BiPAP Ongoing discussions with family in regards to long-term prognosis, and likelihood of respiratory failure which will eventually necessitated tracheostomy We would recommend full DO NOT RESUSCITATE, have attempted to contact family without success.  Brett Canales Minor ACNP [facility] PCCM Pager [phone] till 3 pm If no answer page [phone] 01/27/2016, 10:26 AM  Attending Note:  60 year old male with extensive PMH, debilitated at baseline and now on BiPAP that caused parotitis. His ability to protect his airway is questionable at best on exam. I do not believe that life support would be an option for him as he does not want to have a trach therefore intubation should not be an option but that needs further discussion. I am concerned about his respiratory status. Would maintain him NPO with strict oral hygiene measures to combat parotitis. Abx and high O2 are acceptable options. If fails that then will need to discuss code status but no success in contacting family. PCCM will follow, recommend involvement of palliative care and a full DNR status.  Discussed with [facility]-MD and PCCM-NP.  Off high flow, will sign off.  Alyson Reedy, M.D. [facility] Pulmonary/Critical Care Medicine. Pager: [phone]. After hours pager: [phone].

## 2016-01-28 ENCOUNTER — Other Ambulatory Visit (HOSPITAL_COMMUNITY): Payer: Self-pay

## 2016-01-28 DIAGNOSIS — J189 Pneumonia, unspecified organism: Secondary | ICD-10-CM | POA: Diagnosis not present

## 2016-01-28 DIAGNOSIS — J9690 Respiratory failure, unspecified, unspecified whether with hypoxia or hypercapnia: Secondary | ICD-10-CM | POA: Diagnosis not present

## 2016-01-28 LAB — CBC WITH DIFFERENTIAL/PLATELET
BASOS ABS: 0.1 10*3/uL (ref 0.0–0.1)
BASOS PCT: 1 %
Eosinophils Absolute: 0.6 10*3/uL (ref 0.0–0.7)
Eosinophils Relative: 5 %
HEMATOCRIT: 37.2 % — AB (ref 39.0–52.0)
HEMOGLOBIN: 11 g/dL — AB (ref 13.0–17.0)
LYMPHS PCT: 12 %
Lymphs Abs: 1.6 10*3/uL (ref 0.7–4.0)
MCH: 30.5 pg (ref 26.0–34.0)
MCHC: 29.6 g/dL — AB (ref 30.0–36.0)
MCV: 103 fL — AB (ref 78.0–100.0)
MONO ABS: 0.9 10*3/uL (ref 0.1–1.0)
MONOS PCT: 7 %
NEUTROS ABS: 9.6 10*3/uL — AB (ref 1.7–7.7)
NEUTROS PCT: 75 %
Platelets: 292 10*3/uL (ref 150–400)
RBC: 3.61 MIL/uL — ABNORMAL LOW (ref 4.22–5.81)
RDW: 19.8 % — AB (ref 11.5–15.5)
WBC: 12.8 10*3/uL — ABNORMAL HIGH (ref 4.0–10.5)

## 2016-01-28 LAB — PROTIME-INR
INR: 2.29 — ABNORMAL HIGH (ref 0.00–1.49)
PROTHROMBIN TIME: 25 s — AB (ref 11.6–15.2)

## 2016-01-28 LAB — PHOSPHORUS: PHOSPHORUS: 3.7 mg/dL (ref 2.5–4.6)

## 2016-01-28 LAB — BASIC METABOLIC PANEL
ANION GAP: 10 (ref 5–15)
BUN: 39 mg/dL — ABNORMAL HIGH (ref 6–20)
CALCIUM: 9 mg/dL (ref 8.9–10.3)
CHLORIDE: 112 mmol/L — AB (ref 101–111)
CO2: 31 mmol/L (ref 22–32)
Creatinine, Ser: 1.01 mg/dL (ref 0.61–1.24)
GFR calc non Af Amer: >60 mL/min (ref >60)
GLUCOSE: 124 mg/dL — AB (ref 65–99)
POTASSIUM: 3.7 mmol/L (ref 3.5–5.1)
Sodium: 153 mmol/L — ABNORMAL HIGH (ref 135–145)

## 2016-01-28 LAB — MAGNESIUM: Magnesium: 2.1 mg/dL (ref 1.7–2.4)

## 2016-01-29 LAB — BASIC METABOLIC PANEL
Anion gap: 10 (ref 5–15)
BUN: 41 mg/dL — ABNORMAL HIGH (ref 6–20)
CHLORIDE: 110 mmol/L (ref 101–111)
CO2: 30 mmol/L (ref 22–32)
Calcium: 8.9 mg/dL (ref 8.9–10.3)
Creatinine, Ser: 0.96 mg/dL (ref 0.61–1.24)
Glucose, Bld: 143 mg/dL — ABNORMAL HIGH (ref 65–99)
POTASSIUM: 3.9 mmol/L (ref 3.5–5.1)
SODIUM: 150 mmol/L — AB (ref 135–145)

## 2016-01-29 LAB — PHOSPHORUS: Phosphorus: 3.5 mg/dL (ref 2.5–4.6)

## 2016-01-29 LAB — MAGNESIUM: Magnesium: 2.2 mg/dL (ref 1.7–2.4)

## 2016-01-29 LAB — PROTIME-INR
INR: 1.63 — ABNORMAL HIGH (ref 0.00–1.49)
Prothrombin Time: 19.4 seconds — ABNORMAL HIGH (ref 11.6–15.2)

## 2016-01-30 LAB — BASIC METABOLIC PANEL
Anion gap: 7 (ref 5–15)
BUN: 33 mg/dL — AB (ref 6–20)
CALCIUM: 7.2 mg/dL — AB (ref 8.9–10.3)
CO2: 24 mmol/L (ref 22–32)
CREATININE: 0.77 mg/dL (ref 0.61–1.24)
Chloride: 118 mmol/L — ABNORMAL HIGH (ref 101–111)
GFR calc Af Amer: >60 mL/min (ref >60)
GLUCOSE: 87 mg/dL (ref 65–99)
Potassium: 3 mmol/L — ABNORMAL LOW (ref 3.5–5.1)
Sodium: 149 mmol/L — ABNORMAL HIGH (ref 135–145)

## 2016-01-30 LAB — MAGNESIUM: MAGNESIUM: 1.7 mg/dL (ref 1.7–2.4)

## 2016-01-30 LAB — WOUND CULTURE: Gram Stain: NONE SEEN

## 2016-01-30 LAB — CBC WITH DIFFERENTIAL/PLATELET
BASOS PCT: 1 %
Basophils Absolute: 0.1 10*3/uL (ref 0.0–0.1)
EOS ABS: 0.4 10*3/uL (ref 0.0–0.7)
EOS PCT: 4 %
HEMATOCRIT: 34.5 % — AB (ref 39.0–52.0)
Hemoglobin: 10.1 g/dL — ABNORMAL LOW (ref 13.0–17.0)
Lymphocytes Relative: 25 %
Lymphs Abs: 2.3 10*3/uL (ref 0.7–4.0)
MCH: 30.3 pg (ref 26.0–34.0)
MCHC: 29.3 g/dL — AB (ref 30.0–36.0)
MCV: 103.6 fL — ABNORMAL HIGH (ref 78.0–100.0)
MONO ABS: 0.4 10*3/uL (ref 0.1–1.0)
MONOS PCT: 4 %
Neutro Abs: 6.3 10*3/uL (ref 1.7–7.7)
Neutrophils Relative %: 66 %
PLATELETS: 295 10*3/uL (ref 150–400)
RBC: 3.33 MIL/uL — ABNORMAL LOW (ref 4.22–5.81)
RDW: 19.8 % — AB (ref 11.5–15.5)
WBC: 9.4 10*3/uL (ref 4.0–10.5)

## 2016-01-30 LAB — PHOSPHORUS: Phosphorus: 3 mg/dL (ref 2.5–4.6)

## 2016-01-31 ENCOUNTER — Other Ambulatory Visit (HOSPITAL_COMMUNITY): Payer: Self-pay

## 2016-01-31 DIAGNOSIS — J189 Pneumonia, unspecified organism: Secondary | ICD-10-CM | POA: Diagnosis not present

## 2016-01-31 LAB — BASIC METABOLIC PANEL
ANION GAP: 8 (ref 5–15)
BUN: 34 mg/dL — ABNORMAL HIGH (ref 6–20)
CALCIUM: 8.7 mg/dL — AB (ref 8.9–10.3)
CO2: 28 mmol/L (ref 22–32)
Chloride: 108 mmol/L (ref 101–111)
Creatinine, Ser: 0.94 mg/dL (ref 0.61–1.24)
Glucose, Bld: 104 mg/dL — ABNORMAL HIGH (ref 65–99)
Potassium: 3.9 mmol/L (ref 3.5–5.1)
SODIUM: 144 mmol/L (ref 135–145)

## 2016-01-31 LAB — CULTURE, BLOOD (ROUTINE X 2)

## 2016-01-31 LAB — C DIFFICILE QUICK SCREEN W PCR REFLEX
C DIFFICILE (CDIFF) INTERP: NEGATIVE
C DIFFICILE (CDIFF) TOXIN: NEGATIVE
C Diff antigen: NEGATIVE

## 2016-01-31 LAB — PROTIME-INR
INR: 1.84 — ABNORMAL HIGH (ref 0.00–1.49)
Prothrombin Time: 21.2 seconds — ABNORMAL HIGH (ref 11.6–15.2)

## 2016-02-01 LAB — CBC WITH DIFFERENTIAL/PLATELET
BASOS PCT: 1 %
Basophils Absolute: 0.1 10*3/uL (ref 0.0–0.1)
EOS ABS: 0.2 10*3/uL (ref 0.0–0.7)
Eosinophils Relative: 2 %
HEMATOCRIT: 33.8 % — AB (ref 39.0–52.0)
HEMOGLOBIN: 10.3 g/dL — AB (ref 13.0–17.0)
LYMPHS ABS: 2.2 10*3/uL (ref 0.7–4.0)
Lymphocytes Relative: 25 %
MCH: 30.7 pg (ref 26.0–34.0)
MCHC: 30.5 g/dL (ref 30.0–36.0)
MCV: 100.6 fL — ABNORMAL HIGH (ref 78.0–100.0)
MONO ABS: 0.5 10*3/uL (ref 0.1–1.0)
MONOS PCT: 6 %
NEUTROS ABS: 6 10*3/uL (ref 1.7–7.7)
NEUTROS PCT: 66 %
Platelets: 307 10*3/uL (ref 150–400)
RBC: 3.36 MIL/uL — ABNORMAL LOW (ref 4.22–5.81)
RDW: 19.1 % — AB (ref 11.5–15.5)
WBC: 8.9 10*3/uL (ref 4.0–10.5)

## 2016-02-01 LAB — CULTURE, BLOOD (ROUTINE X 2): Culture: NO GROWTH

## 2016-02-01 LAB — BASIC METABOLIC PANEL
Anion gap: 11 (ref 5–15)
BUN: 32 mg/dL — ABNORMAL HIGH (ref 6–20)
CHLORIDE: 105 mmol/L (ref 101–111)
CO2: 25 mmol/L (ref 22–32)
Calcium: 8.7 mg/dL — ABNORMAL LOW (ref 8.9–10.3)
Creatinine, Ser: 0.9 mg/dL (ref 0.61–1.24)
GLUCOSE: 81 mg/dL (ref 65–99)
Potassium: 4.3 mmol/L (ref 3.5–5.1)
Sodium: 141 mmol/L (ref 135–145)

## 2016-02-01 LAB — PROTIME-INR
INR: 1.97 — AB (ref 0.00–1.49)
PROTHROMBIN TIME: 22.3 s — AB (ref 11.6–15.2)

## 2016-02-01 LAB — MAGNESIUM: MAGNESIUM: 2 mg/dL (ref 1.7–2.4)

## 2016-02-01 LAB — PHOSPHORUS: PHOSPHORUS: 3.6 mg/dL (ref 2.5–4.6)

## 2016-02-02 ENCOUNTER — Other Ambulatory Visit (HOSPITAL_COMMUNITY): Payer: Self-pay

## 2016-02-02 DIAGNOSIS — K7689 Other specified diseases of liver: Secondary | ICD-10-CM | POA: Diagnosis not present

## 2016-02-02 LAB — CULTURE, BLOOD (ROUTINE X 2)
Culture: NO GROWTH
Culture: NO GROWTH

## 2016-02-02 LAB — PROTIME-INR
INR: 2.25 — AB (ref 0.00–1.49)
PROTHROMBIN TIME: 24.6 s — AB (ref 11.6–15.2)

## 2016-02-03 LAB — CBC WITH DIFFERENTIAL/PLATELET
Basophils Absolute: 0.1 10*3/uL (ref 0.0–0.1)
Basophils Relative: 1 %
EOS ABS: 0.1 10*3/uL (ref 0.0–0.7)
Eosinophils Relative: 1 %
HCT: 35.7 % — ABNORMAL LOW (ref 39.0–52.0)
HEMOGLOBIN: 11.3 g/dL — AB (ref 13.0–17.0)
LYMPHS ABS: 1.8 10*3/uL (ref 0.7–4.0)
LYMPHS PCT: 20 %
MCH: 31.9 pg (ref 26.0–34.0)
MCHC: 31.7 g/dL (ref 30.0–36.0)
MCV: 100.8 fL — AB (ref 78.0–100.0)
MONOS PCT: 9 %
Monocytes Absolute: 0.8 10*3/uL (ref 0.1–1.0)
NEUTROS PCT: 69 %
Neutro Abs: 6.1 10*3/uL (ref 1.7–7.7)
Platelets: 347 10*3/uL (ref 150–400)
RBC: 3.54 MIL/uL — AB (ref 4.22–5.81)
RDW: 19 % — ABNORMAL HIGH (ref 11.5–15.5)
WBC: 8.9 10*3/uL (ref 4.0–10.5)

## 2016-02-03 LAB — BASIC METABOLIC PANEL
Anion gap: 6 (ref 5–15)
BUN: 30 mg/dL — AB (ref 6–20)
CHLORIDE: 105 mmol/L (ref 101–111)
CO2: 30 mmol/L (ref 22–32)
CREATININE: 0.83 mg/dL (ref 0.61–1.24)
Calcium: 9.1 mg/dL (ref 8.9–10.3)
GFR calc Af Amer: >60 mL/min (ref >60)
GFR calc non Af Amer: >60 mL/min (ref >60)
GLUCOSE: 109 mg/dL — AB (ref 65–99)
POTASSIUM: 3.9 mmol/L (ref 3.5–5.1)
SODIUM: 141 mmol/L (ref 135–145)

## 2016-02-03 LAB — PROTIME-INR
INR: 1.48 (ref 0.00–1.49)
PROTHROMBIN TIME: 18 s — AB (ref 11.6–15.2)

## 2016-02-03 LAB — MAGNESIUM: MAGNESIUM: 2.1 mg/dL (ref 1.7–2.4)

## 2016-02-03 LAB — PHOSPHORUS: PHOSPHORUS: 3.7 mg/dL (ref 2.5–4.6)

## 2016-02-03 NOTE — H&P (Signed)
Chief Complaint: Dysphagia  Referring Physician(s): LI, CHUN X  Supervising Physician: Ruel Favors  History of Present Illness: Curtis Keller is a 60 y.o. male who is a resident at a nursing home prior to his last acute hospital admit.   He has a significant history of congestive heart failure, apical cardiac thrombus on Coumadin, CVA with residual left-sided weakness.   During his last hospitalization he was admitted with acute blood loss anemia after suffering a syncopal event which resulted in a fall. This resulted in dramatic hematomas involving the left side with a small splenic laceration and contusions.   He ultimately required splenectomy.   His hospital course was complicated by prolonged hospital stay, deconditioning, dysphasia, and aspiration pneumonia with resultant respiratory failure which required noninvasive positive pressure ventilation.  [facility] measures.   Since his admit his course has been complicated by ongoing noninvasive ventilatory dependence, what appears to be recurrent aspiration pneumonia as well as MRSA pneumonia which was isolated by sputum culture on 3/31, and MRSA bacteremia also isolated on that date.   He has since been treated with IV vancomycin as well as meropenem.   He also had been complaining of right facial pain and swelling for about one week while in antibiotics. He was found to have right parotitis.   He is unable to protect his airway and is at high risk for aspiration.  We are asked to perform an image guided placement of a gastrostomy tube.   No past medical history on file.  No past surgical history on file.  Allergies: Review of patient's allergies indicates not on file.  Medications: Prior to Admission medications   Not on File     No family history on file.  Social History   Social History  . Marital Status: Widowed    Spouse Name: N/A  .  Number of Children: N/A  . Years of Education: N/A   Social History Main Topics  . Smoking status: Not on file  . Smokeless tobacco: Not on file  . Alcohol Use: Not on file  . Drug Use: Not on file  . Sexual Activity: Not on file   Other Topics Concern  . Not on file   Social History Narrative  . No narrative on file    Review of Systems: A 12 point ROS discussed Review of Systems  Constitutional: Positive for activity change and fatigue. Negative for fever, chills and appetite change.  HENT: Positive for trouble swallowing.   Respiratory: Negative.   Gastrointestinal: Negative for nausea, vomiting and abdominal pain.  Genitourinary: Negative.   Musculoskeletal: Negative.   Skin: Negative.   Neurological: Negative.   Psychiatric/Behavioral: Negative.     Vital Signs: There were no vitals taken for this visit.  Physical Exam  Constitutional:  Debilitated, in bed. Awake and Alert, answers my questions.  HENT:  NG tube in place  Cardiovascular: Normal rate, regular rhythm and normal heart sounds.   Pulmonary/Chest: Effort normal and breath sounds normal. He has no wheezes.  Abdominal: Soft. Bowel sounds are normal.  Dressing in place central lower abdomen. S/P splenectomy  Neurological:  Awake and Alert  Skin: Skin is warm and dry.    Mallampati Score:  MD Evaluation Airway: WNL Heart: WNL Abdomen: WNL Chest/ Lungs: WNL ASA  Classification: 3 Mallampati/Airway Score: One  Imaging: Labs:  CBC:  Recent Labs  01/28/16 0648 01/30/16 1610 02/01/16 9604 02/03/16 5409  WBC 12.8* 9.4 8.9 8.9  HGB 11.0* 10.1* 10.3* 11.3*  HCT 37.2* 34.5* 33.8* 35.7*  PLT 292 295 307 347    COAGS:  Recent Labs  01/31/16 1420 02/01/16 0623 02/02/16 0702 02/03/16 0917  INR 1.84* 1.97* 2.25* 1.48    BMP:  Recent Labs  01/30/16 0718 01/31/16 0500 02/01/16 0623 02/03/16 0917  NA 149* 144 141 141  K 3.0* 3.9 4.3 3.9  CL 118* 108 105 105  CO2 24 28 25 30     GLUCOSE 87 104* 81 109*  BUN 33* 34* 32* 30*  CALCIUM 7.2* 8.7* 8.7* 9.1  CREATININE 0.77 0.94 0.90 0.83  GFRNONAA >60 >60 >60 >60  GFRAA >60 >60 >60 >60    LIVER FUNCTION TESTS:  Recent Labs  01/24/16 0607 01/25/16 0901  BILITOT  --  1.2  AST  --  47*  ALT  --  34  ALKPHOS  --  118  PROT  --  6.7  ALBUMIN 1.9* 1.7*    TUMOR MARKERS: No results for input(s): AFPTM, CEA, CA199, CHROMGRNA in the last 8760 hours.  Assessment and Plan:  Severe dysphagia in the setting of stroke, further exacerbated by acute parotitis with Protein calorie malnutrition = Dr. Miles Costain has reviewed CT of the abdomen and anatomically he appears to be a good candidate for gastrostomy tube placement. Will plan for Friday 02/05/2016 as long as INR < or = 1.5  History of left atrial thrombus on warfarin = being held. Will check INR am of 02/05/16  MRSA bacteremia = on contact isolation  Thank you for this interesting consult.  I greatly enjoyed meeting Curtis Keller and look forward to participating in their care.  A copy of this report was sent to the requesting provider on this date.  Electronically Signed: Gwynneth Macleod PA-C 02/03/2016, 2:18 PM   I spent a total of 40 Minutes in face to face in clinical consultation, greater than 50% of which was counseling/coordinating care for placement of gastrostomy tube

## 2016-02-04 LAB — PROTIME-INR
INR: 1.44 (ref 0.00–1.49)
Prothrombin Time: 17.6 seconds — ABNORMAL HIGH (ref 11.6–15.2)

## 2016-02-04 LAB — VANCOMYCIN, TROUGH: Vancomycin Tr: 20 ug/mL (ref 10.0–20.0)

## 2016-02-05 LAB — PROTIME-INR
INR: 1.28 (ref 0.00–1.49)
Prothrombin Time: 16.2 seconds — ABNORMAL HIGH (ref 11.6–15.2)

## 2016-02-06 LAB — PROTIME-INR
INR: 1.26 (ref 0.00–1.49)
Prothrombin Time: 15.9 seconds — ABNORMAL HIGH (ref 11.6–15.2)

## 2016-02-06 LAB — BASIC METABOLIC PANEL
Anion gap: 9 (ref 5–15)
BUN: 22 mg/dL — AB (ref 6–20)
CHLORIDE: 102 mmol/L (ref 101–111)
CO2: 28 mmol/L (ref 22–32)
Calcium: 9 mg/dL (ref 8.9–10.3)
Creatinine, Ser: 0.94 mg/dL (ref 0.61–1.24)
GFR calc Af Amer: >60 mL/min (ref >60)
GFR calc non Af Amer: >60 mL/min (ref >60)
GLUCOSE: 115 mg/dL — AB (ref 65–99)
POTASSIUM: 3.8 mmol/L (ref 3.5–5.1)
Sodium: 139 mmol/L (ref 135–145)

## 2016-02-07 LAB — PROTIME-INR
INR: 1.41 (ref 0.00–1.49)
PROTHROMBIN TIME: 17.4 s — AB (ref 11.6–15.2)

## 2016-02-08 ENCOUNTER — Other Ambulatory Visit (HOSPITAL_COMMUNITY): Payer: Self-pay

## 2016-02-08 DIAGNOSIS — R1310 Dysphagia, unspecified: Secondary | ICD-10-CM | POA: Diagnosis not present

## 2016-02-08 LAB — HEMOGLOBIN A1C
HEMOGLOBIN A1C: 5.2 % (ref 4.8–5.6)
Mean Plasma Glucose: 103 mg/dL

## 2016-02-08 LAB — PROTIME-INR
INR: 1.17 (ref 0.00–1.49)
Prothrombin Time: 15.1 seconds (ref 11.6–15.2)

## 2016-02-08 LAB — HEMOGLOBIN AND HEMATOCRIT, BLOOD
HCT: 34.9 % — ABNORMAL LOW (ref 39.0–52.0)
HEMOGLOBIN: 11.1 g/dL — AB (ref 13.0–17.0)

## 2016-02-08 MED ORDER — IOPAMIDOL (ISOVUE-300) INJECTION 61%
INTRAVENOUS | Status: AC
  Administered 2016-02-08: 25 mL
  Filled 2016-02-08: qty 50

## 2016-02-08 MED ORDER — GLUCAGON HCL RDNA (DIAGNOSTIC) 1 MG IJ SOLR
INTRAMUSCULAR | Status: AC
  Filled 2016-02-08: qty 1

## 2016-02-08 MED ORDER — FENTANYL CITRATE (PF) 100 MCG/2ML IJ SOLN
INTRAMUSCULAR | Status: AC
  Filled 2016-02-08: qty 2

## 2016-02-08 MED ORDER — LIDOCAINE HCL 1 % IJ SOLN
INTRAMUSCULAR | Status: AC
  Administered 2016-02-08: 10 mL
  Filled 2016-02-08: qty 20

## 2016-02-08 MED ORDER — GLUCAGON HCL RDNA (DIAGNOSTIC) 1 MG IJ SOLR
INTRAMUSCULAR | Status: AC | PRN
  Administered 2016-02-08: .5 mg via INTRAVENOUS

## 2016-02-08 MED ORDER — MIDAZOLAM HCL 2 MG/2ML IJ SOLN
INTRAMUSCULAR | Status: AC
  Filled 2016-02-08: qty 2

## 2016-02-08 NOTE — Sedation Documentation (Signed)
Patient is resting comfortably. No complaints of pain at this time. Pt O2 saturation increases with stimulation. Pt has to be reminded frequently to take deep breaths

## 2016-02-08 NOTE — Sedation Documentation (Signed)
Pt O2 saturation dropped to 82%. Pt previously on 6L, placed on simple mask. Encouraged deep breathing. Pt coughing, offered suction.

## 2016-02-08 NOTE — Sedation Documentation (Signed)
Vital signs stable. 

## 2016-02-08 NOTE — Sedation Documentation (Signed)
No sedation given at this time. 

## 2016-02-08 NOTE — Sedation Documentation (Signed)
Patient is resting. Vitals stable. No complaints at this time, no sedation given.

## 2016-02-08 NOTE — Sedation Documentation (Signed)
Pt O2 saturation 85%, pt switched to NRB mask. Encouraged deep breathing

## 2016-02-08 NOTE — Sedation Documentation (Signed)
Pt O2 saturation 87% on NRB

## 2016-02-08 NOTE — Procedures (Signed)
Successful 20 FR FLUORO GTUBE INSERTION No comp Stable Full report in pacs Full use tomorrow after seen by PA

## 2016-02-08 NOTE — Sedation Documentation (Signed)
Patient is resting comfortably. No sedation given. 

## 2016-02-09 LAB — CBC WITH DIFFERENTIAL/PLATELET
Basophils Absolute: 0.1 10*3/uL (ref 0.0–0.1)
Basophils Relative: 1 %
Eosinophils Absolute: 0.3 10*3/uL (ref 0.0–0.7)
Eosinophils Relative: 3 %
HEMATOCRIT: 32.7 % — AB (ref 39.0–52.0)
HEMOGLOBIN: 10.3 g/dL — AB (ref 13.0–17.0)
LYMPHS ABS: 2.3 10*3/uL (ref 0.7–4.0)
LYMPHS PCT: 21 %
MCH: 30.8 pg (ref 26.0–34.0)
MCHC: 31.5 g/dL (ref 30.0–36.0)
MCV: 97.9 fL (ref 78.0–100.0)
Monocytes Absolute: 1.2 10*3/uL — ABNORMAL HIGH (ref 0.1–1.0)
Monocytes Relative: 11 %
NEUTROS ABS: 7.1 10*3/uL (ref 1.7–7.7)
NEUTROS PCT: 64 %
Platelets: 331 10*3/uL (ref 150–400)
RBC: 3.34 MIL/uL — AB (ref 4.22–5.81)
RDW: 18.5 % — ABNORMAL HIGH (ref 11.5–15.5)
WBC: 11 10*3/uL — AB (ref 4.0–10.5)

## 2016-02-09 LAB — BASIC METABOLIC PANEL
Anion gap: 10 (ref 5–15)
BUN: 19 mg/dL (ref 6–20)
CHLORIDE: 99 mmol/L — AB (ref 101–111)
CO2: 29 mmol/L (ref 22–32)
Calcium: 8.5 mg/dL — ABNORMAL LOW (ref 8.9–10.3)
Creatinine, Ser: 0.92 mg/dL (ref 0.61–1.24)
GFR calc Af Amer: >60 mL/min (ref >60)
GFR calc non Af Amer: >60 mL/min (ref >60)
GLUCOSE: 85 mg/dL (ref 65–99)
POTASSIUM: 4 mmol/L (ref 3.5–5.1)
Sodium: 138 mmol/L (ref 135–145)

## 2016-02-09 LAB — PROTIME-INR
INR: 1.33 (ref 0.00–1.49)
Prothrombin Time: 16.6 seconds — ABNORMAL HIGH (ref 11.6–15.2)

## 2016-02-09 LAB — PHOSPHORUS: Phosphorus: 4.2 mg/dL (ref 2.5–4.6)

## 2016-02-09 LAB — MAGNESIUM: MAGNESIUM: 1.7 mg/dL (ref 1.7–2.4)

## 2016-02-10 LAB — PROTIME-INR
INR: 1.36 (ref 0.00–1.49)
PROTHROMBIN TIME: 16.9 s — AB (ref 11.6–15.2)

## 2016-02-11 LAB — PROTIME-INR
INR: 1.51 — AB (ref 0.00–1.49)
Prothrombin Time: 18.3 seconds — ABNORMAL HIGH (ref 11.6–15.2)

## 2016-02-12 LAB — BASIC METABOLIC PANEL
Anion gap: 8 (ref 5–15)
BUN: 22 mg/dL — AB (ref 6–20)
CHLORIDE: 99 mmol/L — AB (ref 101–111)
CO2: 32 mmol/L (ref 22–32)
CREATININE: 0.82 mg/dL (ref 0.61–1.24)
Calcium: 9 mg/dL (ref 8.9–10.3)
GFR calc Af Amer: >60 mL/min (ref >60)
GFR calc non Af Amer: >60 mL/min (ref >60)
Glucose, Bld: 89 mg/dL (ref 65–99)
Potassium: 4 mmol/L (ref 3.5–5.1)
Sodium: 139 mmol/L (ref 135–145)

## 2016-02-12 LAB — CBC WITH DIFFERENTIAL/PLATELET
Basophils Absolute: 0.1 10*3/uL (ref 0.0–0.1)
Basophils Relative: 1 %
Eosinophils Absolute: 0.8 10*3/uL — ABNORMAL HIGH (ref 0.0–0.7)
Eosinophils Relative: 9 %
HEMATOCRIT: 32.8 % — AB (ref 39.0–52.0)
HEMOGLOBIN: 10.4 g/dL — AB (ref 13.0–17.0)
LYMPHS ABS: 1.9 10*3/uL (ref 0.7–4.0)
Lymphocytes Relative: 22 %
MCH: 31.9 pg (ref 26.0–34.0)
MCHC: 31.7 g/dL (ref 30.0–36.0)
MCV: 100.6 fL — ABNORMAL HIGH (ref 78.0–100.0)
MONO ABS: 1.1 10*3/uL — AB (ref 0.1–1.0)
MONOS PCT: 13 %
NEUTROS ABS: 4.7 10*3/uL (ref 1.7–7.7)
NEUTROS PCT: 55 %
Platelets: 278 10*3/uL (ref 150–400)
RBC: 3.26 MIL/uL — ABNORMAL LOW (ref 4.22–5.81)
RDW: 17.9 % — ABNORMAL HIGH (ref 11.5–15.5)
WBC: 8.7 10*3/uL (ref 4.0–10.5)

## 2016-02-12 LAB — PROTIME-INR
INR: 1.73 — ABNORMAL HIGH (ref 0.00–1.49)
Prothrombin Time: 20.2 seconds — ABNORMAL HIGH (ref 11.6–15.2)

## 2016-02-12 LAB — CULTURE, RESPIRATORY W GRAM STAIN

## 2016-02-12 LAB — CULTURE, RESPIRATORY

## 2016-02-12 LAB — PHOSPHORUS: Phosphorus: 4.1 mg/dL (ref 2.5–4.6)

## 2016-02-12 LAB — MAGNESIUM: Magnesium: 1.8 mg/dL (ref 1.7–2.4)

## 2016-02-13 LAB — PROTIME-INR
INR: 2.13 — ABNORMAL HIGH (ref 0.00–1.49)
PROTHROMBIN TIME: 23.6 s — AB (ref 11.6–15.2)

## 2016-02-14 LAB — CBC WITH DIFFERENTIAL/PLATELET
BASOS ABS: 0.1 10*3/uL (ref 0.0–0.1)
Basophils Relative: 1 %
Eosinophils Absolute: 1 10*3/uL — ABNORMAL HIGH (ref 0.0–0.7)
Eosinophils Relative: 12 %
HCT: 33.6 % — ABNORMAL LOW (ref 39.0–52.0)
HEMOGLOBIN: 10.6 g/dL — AB (ref 13.0–17.0)
LYMPHS PCT: 26 %
Lymphs Abs: 2.3 10*3/uL (ref 0.7–4.0)
MCH: 31.3 pg (ref 26.0–34.0)
MCHC: 31.5 g/dL (ref 30.0–36.0)
MCV: 99.1 fL (ref 78.0–100.0)
MONOS PCT: 13 %
Monocytes Absolute: 1.1 10*3/uL — ABNORMAL HIGH (ref 0.1–1.0)
Neutro Abs: 4.2 10*3/uL (ref 1.7–7.7)
Neutrophils Relative %: 48 %
Platelets: 249 10*3/uL (ref 150–400)
RBC: 3.39 MIL/uL — AB (ref 4.22–5.81)
RDW: 18.2 % — ABNORMAL HIGH (ref 11.5–15.5)
WBC: 8.7 10*3/uL (ref 4.0–10.5)

## 2016-02-14 LAB — BASIC METABOLIC PANEL
Anion gap: 9 (ref 5–15)
BUN: 21 mg/dL — ABNORMAL HIGH (ref 6–20)
CHLORIDE: 98 mmol/L — AB (ref 101–111)
CO2: 31 mmol/L (ref 22–32)
CREATININE: 0.84 mg/dL (ref 0.61–1.24)
Calcium: 8.8 mg/dL — ABNORMAL LOW (ref 8.9–10.3)
Glucose, Bld: 83 mg/dL (ref 65–99)
POTASSIUM: 4.1 mmol/L (ref 3.5–5.1)
SODIUM: 138 mmol/L (ref 135–145)

## 2016-02-14 LAB — PROTIME-INR
INR: 3.9 — AB (ref 0.00–1.49)
Prothrombin Time: 37.3 seconds — ABNORMAL HIGH (ref 11.6–15.2)

## 2016-02-14 LAB — MAGNESIUM: MAGNESIUM: 1.9 mg/dL (ref 1.7–2.4)

## 2016-02-14 LAB — PHOSPHORUS: PHOSPHORUS: 4.4 mg/dL (ref 2.5–4.6)

## 2016-02-15 LAB — CBC
HCT: 33.3 % — ABNORMAL LOW (ref 39.0–52.0)
HEMOGLOBIN: 10.3 g/dL — AB (ref 13.0–17.0)
MCH: 30.6 pg (ref 26.0–34.0)
MCHC: 30.9 g/dL (ref 30.0–36.0)
MCV: 98.8 fL (ref 78.0–100.0)
Platelets: 241 10*3/uL (ref 150–400)
RBC: 3.37 MIL/uL — AB (ref 4.22–5.81)
RDW: 18 % — ABNORMAL HIGH (ref 11.5–15.5)
WBC: 7.6 10*3/uL (ref 4.0–10.5)

## 2016-02-15 LAB — CULTURE, RESPIRATORY W GRAM STAIN

## 2016-02-15 LAB — PROTIME-INR
INR: 2.04 — AB (ref 0.00–1.49)
PROTHROMBIN TIME: 22.9 s — AB (ref 11.6–15.2)

## 2016-02-16 LAB — PROTIME-INR
INR: 1.87 — AB (ref 0.00–1.49)
PROTHROMBIN TIME: 21.4 s — AB (ref 11.6–15.2)

## 2016-02-17 LAB — CBC WITH DIFFERENTIAL/PLATELET
Basophils Absolute: 0.1 10*3/uL (ref 0.0–0.1)
Basophils Relative: 1 %
EOS ABS: 1 10*3/uL — AB (ref 0.0–0.7)
Eosinophils Relative: 14 %
HCT: 31.2 % — ABNORMAL LOW (ref 39.0–52.0)
HEMOGLOBIN: 9.8 g/dL — AB (ref 13.0–17.0)
LYMPHS ABS: 2 10*3/uL (ref 0.7–4.0)
LYMPHS PCT: 26 %
MCH: 30.8 pg (ref 26.0–34.0)
MCHC: 31.4 g/dL (ref 30.0–36.0)
MCV: 98.1 fL (ref 78.0–100.0)
Monocytes Absolute: 0.8 10*3/uL (ref 0.1–1.0)
Monocytes Relative: 11 %
NEUTROS PCT: 48 %
Neutro Abs: 3.5 10*3/uL (ref 1.7–7.7)
Platelets: 208 10*3/uL (ref 150–400)
RBC: 3.18 MIL/uL — AB (ref 4.22–5.81)
RDW: 18.1 % — ABNORMAL HIGH (ref 11.5–15.5)
WBC: 7.4 10*3/uL (ref 4.0–10.5)

## 2016-02-17 LAB — BASIC METABOLIC PANEL
ANION GAP: 4 — AB (ref 5–15)
Anion gap: 9 (ref 5–15)
BUN: 23 mg/dL — AB (ref 6–20)
BUN: 25 mg/dL — AB (ref 6–20)
CALCIUM: 9.2 mg/dL (ref 8.9–10.3)
CHLORIDE: 108 mmol/L (ref 101–111)
CHLORIDE: 99 mmol/L — AB (ref 101–111)
CO2: 27 mmol/L (ref 22–32)
CO2: 29 mmol/L (ref 22–32)
CREATININE: 0.85 mg/dL (ref 0.61–1.24)
Calcium: 7.9 mg/dL — ABNORMAL LOW (ref 8.9–10.3)
Creatinine, Ser: 0.85 mg/dL (ref 0.61–1.24)
GFR calc Af Amer: >60 mL/min (ref >60)
Glucose, Bld: 91 mg/dL (ref 65–99)
Glucose, Bld: 91 mg/dL (ref 65–99)
POTASSIUM: 3.4 mmol/L — AB (ref 3.5–5.1)
Potassium: 4.5 mmol/L (ref 3.5–5.1)
SODIUM: 139 mmol/L (ref 135–145)
SODIUM: 137 mmol/L (ref 135–145)

## 2016-02-17 LAB — PROTIME-INR
INR: 2.32 — AB (ref 0.00–1.49)
Prothrombin Time: 25.2 seconds — ABNORMAL HIGH (ref 11.6–15.2)

## 2016-02-17 LAB — MAGNESIUM: MAGNESIUM: 1.6 mg/dL — AB (ref 1.7–2.4)

## 2016-02-17 LAB — PHOSPHORUS: PHOSPHORUS: 3.2 mg/dL (ref 2.5–4.6)

## 2016-02-18 LAB — MAGNESIUM: MAGNESIUM: 2 mg/dL (ref 1.7–2.4)

## 2016-02-18 LAB — PROTIME-INR
INR: 1.83 — ABNORMAL HIGH (ref 0.00–1.49)
Prothrombin Time: 21.1 seconds — ABNORMAL HIGH (ref 11.6–15.2)

## 2016-02-18 LAB — PHOSPHORUS: PHOSPHORUS: 3.8 mg/dL (ref 2.5–4.6)

## 2016-02-19 LAB — BASIC METABOLIC PANEL
ANION GAP: 7 (ref 5–15)
BUN: 27 mg/dL — ABNORMAL HIGH (ref 6–20)
CO2: 31 mmol/L (ref 22–32)
Calcium: 9.2 mg/dL (ref 8.9–10.3)
Chloride: 101 mmol/L (ref 101–111)
Creatinine, Ser: 0.94 mg/dL (ref 0.61–1.24)
GFR calc Af Amer: >60 mL/min (ref >60)
Glucose, Bld: 103 mg/dL — ABNORMAL HIGH (ref 65–99)
POTASSIUM: 4.3 mmol/L (ref 3.5–5.1)
SODIUM: 139 mmol/L (ref 135–145)

## 2016-02-19 LAB — CBC WITH DIFFERENTIAL/PLATELET
BASOS ABS: 0.1 10*3/uL (ref 0.0–0.1)
BASOS PCT: 2 %
EOS ABS: 0.8 10*3/uL — AB (ref 0.0–0.7)
EOS PCT: 10 %
HCT: 37.8 % — ABNORMAL LOW (ref 39.0–52.0)
Hemoglobin: 12.1 g/dL — ABNORMAL LOW (ref 13.0–17.0)
Lymphocytes Relative: 29 %
Lymphs Abs: 2.5 10*3/uL (ref 0.7–4.0)
MCH: 32.5 pg (ref 26.0–34.0)
MCHC: 32 g/dL (ref 30.0–36.0)
MCV: 101.6 fL — ABNORMAL HIGH (ref 78.0–100.0)
Monocytes Absolute: 1 10*3/uL (ref 0.1–1.0)
Monocytes Relative: 12 %
Neutro Abs: 4.1 10*3/uL (ref 1.7–7.7)
Neutrophils Relative %: 47 %
PLATELETS: 261 10*3/uL (ref 150–400)
RBC: 3.72 MIL/uL — AB (ref 4.22–5.81)
RDW: 18.2 % — ABNORMAL HIGH (ref 11.5–15.5)
WBC: 8.6 10*3/uL (ref 4.0–10.5)

## 2016-02-19 LAB — PROTIME-INR
INR: 1.88 — AB (ref 0.00–1.49)
Prothrombin Time: 21.6 seconds — ABNORMAL HIGH (ref 11.6–15.2)

## 2016-02-19 LAB — MAGNESIUM: MAGNESIUM: 2 mg/dL (ref 1.7–2.4)

## 2016-02-19 LAB — PHOSPHORUS: Phosphorus: 3.8 mg/dL (ref 2.5–4.6)

## 2016-02-19 NOTE — Consult Note (Unsigned)
NAME:  Curtis Keller, COCKCROFT                   ACCOUNT NO.:  MEDICAL RECORD NO.:  [id number]  LOCATION:                                 FACILITY:  PHYSICIAN:  Kristine Garbe. Ezzard Standing, M.D. DATE OF BIRTH:  DATE OF CONSULTATION:  02/18/2016 DATE OF DISCHARGE:                                CONSULTATION   REASON FOR CONSULTATION:  Evaluate patient for tracheotomy.  BRIEF HISTORY AND INDICATION:  Alter Supino is a 60 year old male who is admitted to [facility] on January 14, 2016, following admission to [facility].  He had a previous history of congestive heart failure as well as multiple strokes with hemiparesis and dysphagia.  He apparently fell at the nursing home, requiring splenectomy, which was performed at [facility]. Following splenectomy he had some wound problems with abdominal wound as well as problems with dysphagia, aspiration, and pneumonia.  He was subsequently transferred to [facility] for long-term care.  He has been on and off BiPAP because of respiratory problems and has a very weak gag reflex and has a tendency to aspirate.  Because of chronic respiratory problems and tendency for aspiration, it was recommended that he undergo a tracheotomy for better airway control. The patient is presently not intubated on BiPAP.  I discussed with patient and daughter who has been his main caregiver at [facility] concerning tracheotomy.  On exam, the patient's neck is only mildly obese.  Trachea is a palpable midline with no palpable masses.  IMPRESSION: 1. History of respiratory failure with dysphagia and aspiration. 2. History of multiple strokes with apical thrombus, on Coumadin. 3. Left hemiparesis secondary to multiple strokes. 4. History of congestive heart failure.  PLAN:  The patient is scheduled for tracheostomy for Monday at 7:30.          ______________________________ Kristine Garbe. Ezzard Standing,  M.D.     CEN/MEDQ  D:  02/18/2016  T:  02/18/2016  Job:  [id number]

## 2016-02-20 LAB — PROTIME-INR
INR: 1.45 (ref 0.00–1.49)
Prothrombin Time: 17.7 seconds — ABNORMAL HIGH (ref 11.6–15.2)

## 2016-02-21 LAB — CBC
HEMATOCRIT: 38.3 % — AB (ref 39.0–52.0)
HEMOGLOBIN: 12.2 g/dL — AB (ref 13.0–17.0)
MCH: 31.9 pg (ref 26.0–34.0)
MCHC: 31.9 g/dL (ref 30.0–36.0)
MCV: 100.3 fL — AB (ref 78.0–100.0)
Platelets: 259 10*3/uL (ref 150–400)
RBC: 3.82 MIL/uL — AB (ref 4.22–5.81)
RDW: 18.5 % — ABNORMAL HIGH (ref 11.5–15.5)
WBC: 8.1 10*3/uL (ref 4.0–10.5)

## 2016-02-21 LAB — BASIC METABOLIC PANEL
Anion gap: 9 (ref 5–15)
BUN: 29 mg/dL — AB (ref 6–20)
CALCIUM: 9.3 mg/dL (ref 8.9–10.3)
CO2: 29 mmol/L (ref 22–32)
CREATININE: 0.99 mg/dL (ref 0.61–1.24)
Chloride: 99 mmol/L — ABNORMAL LOW (ref 101–111)
Glucose, Bld: 105 mg/dL — ABNORMAL HIGH (ref 65–99)
Potassium: 4.4 mmol/L (ref 3.5–5.1)
SODIUM: 137 mmol/L (ref 135–145)

## 2016-02-21 LAB — PROTIME-INR
INR: 1.38 (ref 0.00–1.49)
INR: 1.29 (ref 0.00–1.49)
Prothrombin Time: 17 seconds — ABNORMAL HIGH (ref 11.6–15.2)
Prothrombin Time: 16.2 seconds — ABNORMAL HIGH (ref 11.6–15.2)

## 2016-02-21 NOTE — Anesthesia Preprocedure Evaluation (Addendum)
Anesthesia Evaluation  Patient identified by MRN, date of birth, ID band Patient awake    Reviewed: Allergy & Precautions, NPO status , Patient's Chart, lab work & pertinent test results  Airway Mallampati: II  TM Distance: >3 FB Neck ROM: Full    Dental no notable dental hx.    Pulmonary neg pulmonary ROS,    Pulmonary exam normal breath sounds clear to auscultation (-) decreased breath sounds      Cardiovascular +CHF  negative cardio ROS Normal cardiovascular exam Rhythm:Regular Rate:Normal     Neuro/Psych CVA, Residual Symptoms negative psych ROS   GI/Hepatic negative GI ROS, Neg liver ROS,   Endo/Other  negative endocrine ROS  Renal/GU negative Renal ROS  negative genitourinary   Musculoskeletal negative musculoskeletal ROS (+)   Abdominal   Peds  Hematology negative hematology ROS (+)   Anesthesia Other Findings   Reproductive/Obstetrics                            Anesthesia Physical Anesthesia Plan  ASA: IV  Anesthesia Plan: General   Post-op Pain Management:    Induction: Intravenous  Airway Management Planned: Oral ETT  Additional Equipment:   Intra-op Plan:   Post-operative Plan: Extubation in OR  Informed Consent: I have reviewed the patients History and Physical, chart, labs and discussed the procedure including the risks, benefits and alternatives for the proposed anesthesia with the patient or authorized representative who has indicated his/her understanding and acceptance.   Dental advisory given  Plan Discussed with: CRNA  Anesthesia Plan Comments:         Anesthesia Quick Evaluation

## 2016-02-22 ENCOUNTER — Encounter (HOSPITAL_COMMUNITY): Payer: Self-pay | Admitting: Anesthesiology

## 2016-02-22 ENCOUNTER — Encounter
Admission: AD | Disposition: A | Discharge status: Skilled Nursing Facility | Payer: Self-pay | Source: Ambulatory Visit | Attending: Internal Medicine

## 2016-02-22 ENCOUNTER — Other Ambulatory Visit (HOSPITAL_COMMUNITY): Payer: Self-pay

## 2016-02-22 DIAGNOSIS — J9612 Chronic respiratory failure with hypercapnia: Secondary | ICD-10-CM | POA: Diagnosis not present

## 2016-02-22 DIAGNOSIS — J9690 Respiratory failure, unspecified, unspecified whether with hypoxia or hypercapnia: Secondary | ICD-10-CM | POA: Diagnosis not present

## 2016-02-22 DIAGNOSIS — J690 Pneumonitis due to inhalation of food and vomit: Secondary | ICD-10-CM | POA: Diagnosis not present

## 2016-02-22 HISTORY — PX: TRACHEOSTOMY TUBE PLACEMENT: SHX814

## 2016-02-22 LAB — PROTIME-INR
INR: 1.22 (ref 0.00–1.49)
PROTHROMBIN TIME: 15.5 s — AB (ref 11.6–15.2)

## 2016-02-22 SURGERY — CREATION, TRACHEOSTOMY
Anesthesia: General | Site: Neck

## 2016-02-22 MED ORDER — PROPOFOL 10 MG/ML IV BOLUS
INTRAVENOUS | Status: DC | PRN
  Administered 2016-02-22: 90 mg via INTRAVENOUS

## 2016-02-22 MED ORDER — LACTATED RINGERS IV SOLN
INTRAVENOUS | Status: DC | PRN
  Administered 2016-02-22: 07:00:00 via INTRAVENOUS

## 2016-02-22 MED ORDER — SURGILUBE EX GEL
CUTANEOUS | Status: DC | PRN
  Administered 2016-02-22: 1 via TOPICAL

## 2016-02-22 MED ORDER — ROCURONIUM BROMIDE 100 MG/10ML IV SOLN
INTRAVENOUS | Status: DC | PRN
  Administered 2016-02-22: 25 mg via INTRAVENOUS
  Administered 2016-02-22: 30 mg via INTRAVENOUS

## 2016-02-22 MED ORDER — SUGAMMADEX SODIUM 200 MG/2ML IV SOLN
INTRAVENOUS | Status: DC | PRN
  Administered 2016-02-22: 200 mg via INTRAVENOUS

## 2016-02-22 MED ORDER — MIDAZOLAM HCL 5 MG/5ML IJ SOLN
INTRAMUSCULAR | Status: DC | PRN
  Administered 2016-02-22 (×2): 1 mg via INTRAVENOUS

## 2016-02-22 MED ORDER — MIDAZOLAM HCL 2 MG/2ML IJ SOLN
INTRAMUSCULAR | Status: AC
  Filled 2016-02-22: qty 2

## 2016-02-22 MED ORDER — FENTANYL CITRATE (PF) 250 MCG/5ML IJ SOLN
INTRAMUSCULAR | Status: AC
  Filled 2016-02-22: qty 5

## 2016-02-22 MED ORDER — LIDOCAINE-EPINEPHRINE 1 %-1:100000 IJ SOLN
INTRAMUSCULAR | Status: DC | PRN
  Administered 2016-02-22: 4 mL

## 2016-02-22 MED ORDER — 0.9 % SODIUM CHLORIDE (POUR BTL) OPTIME
TOPICAL | Status: DC | PRN
  Administered 2016-02-22: 1000 mL

## 2016-02-22 MED ORDER — CEFAZOLIN SODIUM-DEXTROSE 2-3 GM-% IV SOLR
INTRAVENOUS | Status: DC | PRN
  Administered 2016-02-22: 2 g via INTRAVENOUS

## 2016-02-22 MED ORDER — PROPOFOL 10 MG/ML IV BOLUS
INTRAVENOUS | Status: AC
  Filled 2016-02-22: qty 20

## 2016-02-22 MED ORDER — MORPHINE BOLUS VIA INFUSION: 1.0000 mg | INTRAVENOUS | Status: DC | PRN

## 2016-02-22 MED ORDER — PROPOFOL 1000 MG/100ML IV EMUL
INTRAVENOUS | Status: AC
  Filled 2016-02-22: qty 100

## 2016-02-22 MED ORDER — LIDOCAINE-EPINEPHRINE 1 %-1:100000 IJ SOLN
INTRAMUSCULAR | Status: AC
  Filled 2016-02-22: qty 1

## 2016-02-22 MED ORDER — LIDOCAINE HCL (CARDIAC) 20 MG/ML IV SOLN: INTRAVENOUS | Status: DC | PRN

## 2016-02-22 MED ORDER — PHENYLEPHRINE HCL 10 MG/ML IJ SOLN
INTRAMUSCULAR | Status: DC | PRN
  Administered 2016-02-22: 80 ug via INTRAVENOUS

## 2016-02-22 MED ORDER — KETAMINE HCL 100 MG/ML IJ SOLN
INTRAMUSCULAR | Status: AC
  Filled 2016-02-22: qty 1

## 2016-02-22 MED ORDER — FENTANYL CITRATE (PF) 250 MCG/5ML IJ SOLN
INTRAMUSCULAR | Status: DC | PRN
  Administered 2016-02-22: 100 ug via INTRAVENOUS

## 2016-02-22 MED ORDER — CEFAZOLIN SODIUM 1 G IJ SOLR
INTRAMUSCULAR | Status: AC
  Filled 2016-02-22: qty 20

## 2016-02-22 MED ORDER — ONDANSETRON HCL 4 MG/2ML IJ SOLN
INTRAMUSCULAR | Status: DC | PRN
  Administered 2016-02-22: 4 mg via INTRAVENOUS

## 2016-02-22 MED ORDER — PHENYLEPHRINE 40 MCG/ML (10ML) SYRINGE FOR IV PUSH (FOR BLOOD PRESSURE SUPPORT)
PREFILLED_SYRINGE | INTRAVENOUS | Status: AC
  Filled 2016-02-22: qty 10

## 2016-02-22 SURGICAL SUPPLY — 41 items
ATTRACTOMAT 16X20 MAGNETIC DRP (DRAPES) IMPLANT
BLADE 10 SAFETY STRL DISP (BLADE) IMPLANT
BLADE SURG 15 STRL LF DISP TIS (BLADE) ×2 IMPLANT
BLADE SURG 15 STRL SS (BLADE) ×4
CLEANER TIP ELECTROSURG 2X2 (MISCELLANEOUS) ×3 IMPLANT
COVER SURGICAL LIGHT HANDLE (MISCELLANEOUS) ×3 IMPLANT
DRAPE PROXIMA HALF (DRAPES) IMPLANT
ELECT COATED BLADE 2.86 ST (ELECTRODE) ×3 IMPLANT
ELECT REM PT RETURN 9FT ADLT (ELECTROSURGICAL) ×3
ELECTRODE REM PT RTRN 9FT ADLT (ELECTROSURGICAL) ×1 IMPLANT
GAUZE SPONGE 4X4 16PLY XRAY LF (GAUZE/BANDAGES/DRESSINGS) ×3 IMPLANT
GLOVE BIOGEL PI IND STRL 6.5 (GLOVE) ×1 IMPLANT
GLOVE BIOGEL PI INDICATOR 6.5 (GLOVE) ×2
GLOVE ECLIPSE 6.5 STRL STRAW (GLOVE) ×3 IMPLANT
GLOVE SS BIOGEL STRL SZ 7.5 (GLOVE) ×2 IMPLANT
GLOVE SUPERSENSE BIOGEL SZ 7.5 (GLOVE) ×4
GOWN STRL REUS W/ TWL LRG LVL3 (GOWN DISPOSABLE) ×2 IMPLANT
GOWN STRL REUS W/ TWL XL LVL3 (GOWN DISPOSABLE) ×1 IMPLANT
GOWN STRL REUS W/TWL LRG LVL3 (GOWN DISPOSABLE) ×4
GOWN STRL REUS W/TWL XL LVL3 (GOWN DISPOSABLE) ×2
HOLDER TRACH TUBE VELCRO 19.5 (MISCELLANEOUS) ×3 IMPLANT
KIT BASIN OR (CUSTOM PROCEDURE TRAY) ×3 IMPLANT
KIT ROOM TURNOVER OR (KITS) ×3 IMPLANT
KIT SUCTION CATH 14FR (SUCTIONS) ×3 IMPLANT
NEEDLE HYPO 25GX1X1/2 BEV (NEEDLE) IMPLANT
NEEDLE HYPO 25X1 1.5 SAFETY (NEEDLE) ×3 IMPLANT
NS IRRIG 1000ML POUR BTL (IV SOLUTION) ×3 IMPLANT
PACK EENT II TURBAN DRAPE (CUSTOM PROCEDURE TRAY) ×3 IMPLANT
PAD ARMBOARD 7.5X6 YLW CONV (MISCELLANEOUS) ×6 IMPLANT
PENCIL BUTTON HOLSTER BLD 10FT (ELECTRODE) ×3 IMPLANT
SPONGE DRAIN TRACH 4X4 STRL 2S (GAUZE/BANDAGES/DRESSINGS) ×3 IMPLANT
SPONGE INTESTINAL PEANUT (DISPOSABLE) IMPLANT
SUT SILK 2 0 SH CR/8 (SUTURE) ×3 IMPLANT
SUT SILK 3 0 TIES 10X30 (SUTURE) IMPLANT
SYR 20CC LL (SYRINGE) ×3 IMPLANT
SYR CONTROL 10ML LL (SYRINGE) ×3 IMPLANT
TUBE CONNECTING 12'X1/4 (SUCTIONS) ×1
TUBE CONNECTING 12X1/4 (SUCTIONS) ×2 IMPLANT
TUBE TRACH SHILEY  6 DIST  CUF (TUBING) ×3 IMPLANT
TUBE TRACH SHILEY 10 DIST CUFF (TUBING) IMPLANT
TUBE TRACH SHILEY 8 DIST CUF (TUBING) IMPLANT

## 2016-02-22 NOTE — Interval H&P Note (Signed)
History and Physical Interval Note:  02/22/2016 7:36 AM  Curtis Keller  has presented today for surgery, with the diagnosis of RESP FAILURE   The various methods of treatment have been discussed with the patient and family. After consideration of risks, benefits and other options for treatment, the patient has consented to  Procedure(s): TRACHEOSTOMY (N/A) as a surgical intervention .  The patient's history has been reviewed, patient examined, no change in status, stable for surgery.  I have reviewed the patient's chart and labs.  Questions were answered to the patient's satisfaction.     Magdalene Tardiff

## 2016-02-22 NOTE — Brief Op Note (Signed)
01/13/2016 - 02/22/2016  8:34 AM  PATIENT:  Lars Mage  60 y.o. male  PRE-OPERATIVE DIAGNOSIS:  RESPIRATORY FAILURE   POST-OPERATIVE DIAGNOSIS:  RESPIRATORY FAILURE   PROCEDURE:  Procedure(s): TRACHEOSTOMY (N/A)  SURGEON:  Surgeon(s) and Role:    * [name], MD - Primary  PHYSICIAN ASSISTANT:   ASSISTANTS: none   ANESTHESIA:   general  EBL:   minimal  BLOOD ADMINISTERED:none  DRAINS: none   LOCAL MEDICATIONS USED:  XYLOCAINE  With EPI 4 cc  SPECIMEN:  No Specimen  DISPOSITION OF SPECIMEN:  N/A  COUNTS:  YES  TOURNIQUET:  * No tourniquets in log *  DICTATION: .Other Dictation: Dictation Number [id number]  PLAN OF CARE: Discharge to home after PACU  PATIENT DISPOSITION:  PACU - hemodynamically stable.   Delay start of Pharmacological VTE agent (>24hrs) due to surgical blood loss or risk of bleeding: not applicable

## 2016-02-22 NOTE — Anesthesia Procedure Notes (Signed)
Procedure Name: Intubation Date/Time: 02/22/2016 7:56 AM Performed by: Reine Just Pre-anesthesia Checklist: Patient identified, Emergency Drugs available, Suction available, Patient being monitored and Timeout performed Patient Re-evaluated:Patient Re-evaluated prior to inductionOxygen Delivery Method: Circle system utilized and Simple face mask Preoxygenation: Pre-oxygenation with 100% oxygen Intubation Type: IV induction, Rapid sequence and Cricoid Pressure applied Ventilation: Mask ventilation without difficulty Laryngoscope Size: Mac and 4 Tube type: Oral Tube size: 7.0 mm Number of attempts: 1 Airway Equipment and Method: Patient positioned with wedge pillow and Stylet Placement Confirmation: ETT inserted through vocal cords under direct vision,  positive ETCO2 and breath sounds checked- equal and bilateral Secured at: 22 cm Tube secured with: Tape Dental Injury: Teeth and Oropharynx as per pre-operative assessment

## 2016-02-22 NOTE — Transfer of Care (Signed)
Immediate Anesthesia Transfer of Care Note  Patient: Curtis Keller  Procedure(s) Performed: Procedure(s): TRACHEOSTOMY (N/A)  Patient Location: PACU and LTAC  Anesthesia Type:General  Level of Consciousness: awake and alert   Airway & Oxygen Therapy: Patient Spontanous Breathing and Patient connected to face mask  Post-op Assessment: Report given to RN and Post -op Vital signs reviewed and stable  Post vital signs: Reviewed and stable  Last Vitals:  Filed Vitals:   02/08/16 0955 02/08/16 1000  BP:  92/78  Pulse: 74 74  Resp: 21 21    Last Pain: There were no vitals filed for this visit.       Complications: No apparent anesthesia complications

## 2016-02-22 NOTE — H&P (Signed)
PREOPERATIVE H&P  Chief Complaint: respiratory difficulty  HPI: Curtis Keller is a 60 y.o. male who presents for evaluation of respiratory difficulty and aspiration. Patient admitted to [facility] hospital but a month ago and has been problems with aspiration and difficulty with respirations requiring BiPAP. It has been recommended that he have a tracheostomy to protect his airway. This has been discussed with him and his daughter and they are agreeable to placement of a trach.  No past medical history on file. No past surgical history on file. Social History   Social History  . Marital Status: Widowed    Spouse Name: N/A  . Number of Children: N/A  . Years of Education: N/A   Social History Main Topics  . Smoking status: Not on file  . Smokeless tobacco: Not on file  . Alcohol Use: Not on file  . Drug Use: Not on file  . Sexual Activity: Not on file   Other Topics Concern  . Not on file   Social History Narrative  . No narrative on file   No family history on file. Allergies  Allergen Reactions  . Codeine Anaphylaxis and Nausea And Vomiting  . Hydrocodone Other (See Comments)  . Hydrocodone-Acetaminophen Nausea And Vomiting  . Lisinopril Itching and Rash  . Tylenol [Acetaminophen] Nausea Only   Prior to Admission medications   Not on File     Positive ROS: neg  All other systems have been reviewed and were otherwise negative with the exception of those mentioned in the HPI and as above.  Physical Exam: Filed Vitals:   02/08/16 0955 02/08/16 1000  BP:  92/78  Pulse: 74 74  Resp: 21 21    General: Alert, no acute distress. But confused. Oral: Normal oral mucosa and tonsils Nasal: Clear nasal passages Neck: No palpable adenopathy or thyroid nodules. Average size neck but obese patient. No neck masses. Trach midline. Ear: Ear canal is clear with normal appearing TMs Cardiovascular: Regular rate and rhythm, no murmur.  Respiratory: Clear to  auscultation Neurologic: Alert and oriented x 3   Assessment/Plan: RESP FAILURE  Plan for Procedure(s): TRACHEOSTOMY   Dillard Cannon, MD 02/22/2016 7:30 AM

## 2016-02-22 NOTE — Anesthesia Postprocedure Evaluation (Signed)
Anesthesia Post Note  Patient: Curtis Keller  Procedure(s) Performed: Procedure(s) (LRB): TRACHEOSTOMY (N/A)  Patient location during evaluation: PACU Anesthesia Type: General Level of consciousness: sedated and patient cooperative Pain management: pain level controlled Vital Signs Assessment: post-procedure vital signs reviewed and stable Respiratory status: spontaneous breathing Cardiovascular status: stable Anesthetic complications: no    Last Vitals:  Filed Vitals:   02/08/16 0955 02/08/16 1000  BP:  92/78  Pulse: 74 74  Resp: 21 21    Last Pain: There were no vitals filed for this visit.               Lewie Loron

## 2016-02-23 ENCOUNTER — Encounter (HOSPITAL_COMMUNITY): Payer: Self-pay | Admitting: Otolaryngology

## 2016-02-23 LAB — BASIC METABOLIC PANEL
Anion gap: 8 (ref 5–15)
BUN: 31 mg/dL — AB (ref 6–20)
CALCIUM: 9.2 mg/dL (ref 8.9–10.3)
CO2: 31 mmol/L (ref 22–32)
CREATININE: 0.93 mg/dL (ref 0.61–1.24)
Chloride: 98 mmol/L — ABNORMAL LOW (ref 101–111)
GFR calc Af Amer: >60 mL/min (ref >60)
GFR calc non Af Amer: >60 mL/min (ref >60)
GLUCOSE: 113 mg/dL — AB (ref 65–99)
Potassium: 4.2 mmol/L (ref 3.5–5.1)
Sodium: 137 mmol/L (ref 135–145)

## 2016-02-23 LAB — PROTIME-INR
INR: 1.32 (ref 0.00–1.49)
Prothrombin Time: 16.5 seconds — ABNORMAL HIGH (ref 11.6–15.2)

## 2016-02-23 LAB — CBC
HEMATOCRIT: 36.1 % — AB (ref 39.0–52.0)
Hemoglobin: 11.3 g/dL — ABNORMAL LOW (ref 13.0–17.0)
MCH: 30.7 pg (ref 26.0–34.0)
MCHC: 31.3 g/dL (ref 30.0–36.0)
MCV: 98.1 fL (ref 78.0–100.0)
PLATELETS: 273 10*3/uL (ref 150–400)
RBC: 3.68 MIL/uL — ABNORMAL LOW (ref 4.22–5.81)
RDW: 18.1 % — AB (ref 11.5–15.5)
WBC: 11.3 10*3/uL — ABNORMAL HIGH (ref 4.0–10.5)

## 2016-02-23 NOTE — Op Note (Unsigned)
NAMESHRIYANS, DILULLO              ACCOUNT NO.:  [id number]  MEDICAL RECORD NO.:  [id number]  LOCATION:                       FACILITY:  [facility]  PHYSICIAN:  Kristine Garbe. Ezzard Standing, M.D.DATE OF BIRTH:  April 11, 1956  DATE OF PROCEDURE:  02/22/2016 DATE OF DISCHARGE:                              OPERATIVE REPORT   PREOPERATIVE DIAGNOSIS:  Respiratory failure with history of aspiration.  POSTOPERATIVE DIAGNOSIS:  Respiratory failure with history of aspiration.  OPERATION PERFORMED:  Tracheostomy with a #6 cuffed Shiley tracheotomy tube.  SURGEON:  Kristine Garbe. Ezzard Standing, M.D.  ANESTHESIA:  General endotracheal.  COMPLICATIONS:  None.  ESTIMATED BLOOD LOSS:  Minimal.  BRIEF CLINICAL NOTE:  The patient is a 60 year old gentleman who has had a previous history of several strokes.  He apparently recently fell having significant blood loss requiring splenectomy.  He subsequently developed a pneumonia and aspiration problems postoperatively and was transferred to [facility] in about a month ago for long- term care.  He has been intubated previously.  He has been on and off BiPAP.  He has history of obstructive sleep apnea in addition to chronic strokes and has a very poor gag reflex with a tendency to aspirate. Because of his chronic respiratory problems as well as tendency to aspirate, it was felt it may be better treated with a tracheotomy to preserve his airway.  This been discussed with the patient as well as his daughter who has been his primary caregiver, and they are agreeable to performing a tracheotomy.  He is taken to the operating room from [facility] to have a tracheotomy performed.  He is presently not intubated.  DESCRIPTION OF PROCEDURE:  The patient was brought to the operating room in the supine position, was subsequently underwent general endotracheal anesthesia.  The neck was then marked and prepped with Betadine.  A 4 mL of  Xylocaine with epinephrine were injected into the proposed incision site just below the cricoid cartilage in the midline.  An approximately 2-2.5 cm incision was made midline directly over the trachea. Subcutaneous tissue and strap muscles were divided longitudinally in midline and retracted laterally.  Cricoid cartilage and upper three tracheal rings were cleared off with a cautery, had to divide the upper portion of the isthmus.  A horizontal tracheotomy was then performed through the first and second tracheal rings.  The endotracheal tube was removed and a #6 cuffed Shiley tube was inserted without any difficulty. This was secured to the neck with four 2-0 silk sutures and Velcro trach collar around the neck.  The patient was ventilated well with the tracheotomy.  This completed the procedure. The patient was subsequently transferred to the recovery room and will be transferred back to [facility].          ______________________________ Kristine Garbe. Ezzard Standing, M.D.     CEN/MEDQ  D:  02/22/2016  T:  02/22/2016  Job:  [id number]

## 2016-02-24 ENCOUNTER — Other Ambulatory Visit (HOSPITAL_COMMUNITY): Payer: Self-pay

## 2016-02-24 DIAGNOSIS — J9690 Respiratory failure, unspecified, unspecified whether with hypoxia or hypercapnia: Secondary | ICD-10-CM | POA: Diagnosis not present

## 2016-02-24 LAB — PROTIME-INR
INR: 1.22 (ref 0.00–1.49)
Prothrombin Time: 15.6 seconds — ABNORMAL HIGH (ref 11.6–15.2)

## 2016-02-25 LAB — PROTIME-INR
INR: 1.28 (ref 0.00–1.49)
PROTHROMBIN TIME: 16.1 s — AB (ref 11.6–15.2)

## 2016-02-26 LAB — CBC
HEMATOCRIT: 34.9 % — AB (ref 39.0–52.0)
HEMOGLOBIN: 10.8 g/dL — AB (ref 13.0–17.0)
MCH: 30.8 pg (ref 26.0–34.0)
MCHC: 30.9 g/dL (ref 30.0–36.0)
MCV: 99.4 fL (ref 78.0–100.0)
Platelets: 340 10*3/uL (ref 150–400)
RBC: 3.51 MIL/uL — AB (ref 4.22–5.81)
RDW: 18.1 % — ABNORMAL HIGH (ref 11.5–15.5)
WBC: 9.2 10*3/uL (ref 4.0–10.5)

## 2016-02-26 LAB — PROTIME-INR
INR: 1.17 (ref 0.00–1.49)
Prothrombin Time: 15.1 seconds (ref 11.6–15.2)

## 2016-02-27 LAB — BASIC METABOLIC PANEL
ANION GAP: 11 (ref 5–15)
BUN: 28 mg/dL — ABNORMAL HIGH (ref 6–20)
CALCIUM: 9.4 mg/dL (ref 8.9–10.3)
CHLORIDE: 98 mmol/L — AB (ref 101–111)
CO2: 29 mmol/L (ref 22–32)
Creatinine, Ser: 0.84 mg/dL (ref 0.61–1.24)
GFR calc non Af Amer: >60 mL/min (ref >60)
GLUCOSE: 126 mg/dL — AB (ref 65–99)
POTASSIUM: 4 mmol/L (ref 3.5–5.1)
Sodium: 138 mmol/L (ref 135–145)

## 2016-02-27 LAB — CBC
HEMATOCRIT: 35.3 % — AB (ref 39.0–52.0)
Hemoglobin: 11.1 g/dL — ABNORMAL LOW (ref 13.0–17.0)
MCH: 31 pg (ref 26.0–34.0)
MCHC: 31.4 g/dL (ref 30.0–36.0)
MCV: 98.6 fL (ref 78.0–100.0)
Platelets: 310 10*3/uL (ref 150–400)
RBC: 3.58 MIL/uL — ABNORMAL LOW (ref 4.22–5.81)
RDW: 17.7 % — AB (ref 11.5–15.5)
WBC: 8.9 10*3/uL (ref 4.0–10.5)

## 2016-02-27 LAB — PROTIME-INR
INR: 1.23 (ref 0.00–1.49)
Prothrombin Time: 15.7 seconds — ABNORMAL HIGH (ref 11.6–15.2)

## 2016-02-28 LAB — PROTIME-INR
INR: 1.32 (ref 0.00–1.49)
PROTHROMBIN TIME: 16.5 s — AB (ref 11.6–15.2)

## 2016-02-29 LAB — PROTIME-INR
INR: 1.46 (ref 0.00–1.49)
PROTHROMBIN TIME: 17.8 s — AB (ref 11.6–15.2)

## 2016-02-29 LAB — CBC WITH DIFFERENTIAL/PLATELET
BASOS PCT: 2 %
Basophils Absolute: 0.2 10*3/uL — ABNORMAL HIGH (ref 0.0–0.1)
Eosinophils Absolute: 1.3 10*3/uL — ABNORMAL HIGH (ref 0.0–0.7)
Eosinophils Relative: 14 %
HEMATOCRIT: 34 % — AB (ref 39.0–52.0)
HEMOGLOBIN: 10.7 g/dL — AB (ref 13.0–17.0)
LYMPHS ABS: 2.9 10*3/uL (ref 0.7–4.0)
LYMPHS PCT: 30 %
MCH: 30.8 pg (ref 26.0–34.0)
MCHC: 31.5 g/dL (ref 30.0–36.0)
MCV: 98 fL (ref 78.0–100.0)
MONO ABS: 1.3 10*3/uL — AB (ref 0.1–1.0)
MONOS PCT: 14 %
NEUTROS PCT: 40 %
Neutro Abs: 3.9 10*3/uL (ref 1.7–7.7)
Platelets: 359 10*3/uL (ref 150–400)
RBC: 3.47 MIL/uL — ABNORMAL LOW (ref 4.22–5.81)
RDW: 17.7 % — ABNORMAL HIGH (ref 11.5–15.5)
WBC: 8.9 10*3/uL (ref 4.0–10.5)

## 2016-02-29 LAB — MAGNESIUM: Magnesium: 2 mg/dL (ref 1.7–2.4)

## 2016-02-29 LAB — PHOSPHORUS: PHOSPHORUS: 4 mg/dL (ref 2.5–4.6)

## 2016-02-29 LAB — BASIC METABOLIC PANEL
Anion gap: 9 (ref 5–15)
BUN: 22 mg/dL — ABNORMAL HIGH (ref 6–20)
CHLORIDE: 101 mmol/L (ref 101–111)
CO2: 30 mmol/L (ref 22–32)
CREATININE: 0.76 mg/dL (ref 0.61–1.24)
Calcium: 9.4 mg/dL (ref 8.9–10.3)
GFR calc non Af Amer: >60 mL/min (ref >60)
GLUCOSE: 83 mg/dL (ref 65–99)
Potassium: 4 mmol/L (ref 3.5–5.1)
Sodium: 140 mmol/L (ref 135–145)

## 2016-02-29 LAB — CULTURE, RESPIRATORY W GRAM STAIN: Culture: NORMAL

## 2016-02-29 LAB — CULTURE, RESPIRATORY: GRAM STAIN: NONE SEEN

## 2016-03-01 LAB — PROTIME-INR
INR: 1.86 — AB (ref 0.00–1.49)
Prothrombin Time: 21.4 seconds — ABNORMAL HIGH (ref 11.6–15.2)

## 2016-03-02 ENCOUNTER — Other Ambulatory Visit (HOSPITAL_COMMUNITY): Payer: Self-pay

## 2016-03-02 LAB — BASIC METABOLIC PANEL
Anion gap: 10 (ref 5–15)
BUN: 25 mg/dL — AB (ref 6–20)
CALCIUM: 9.5 mg/dL (ref 8.9–10.3)
CO2: 28 mmol/L (ref 22–32)
CREATININE: 0.78 mg/dL (ref 0.61–1.24)
Chloride: 102 mmol/L (ref 101–111)
GLUCOSE: 102 mg/dL — AB (ref 65–99)
Potassium: 4.1 mmol/L (ref 3.5–5.1)
SODIUM: 140 mmol/L (ref 135–145)

## 2016-03-02 LAB — CBC WITH DIFFERENTIAL/PLATELET
BASOS ABS: 0.1 10*3/uL (ref 0.0–0.1)
BASOS PCT: 1 %
EOS ABS: 1.4 10*3/uL — AB (ref 0.0–0.7)
EOS PCT: 15 %
HCT: 36.5 % — ABNORMAL LOW (ref 39.0–52.0)
Hemoglobin: 11.7 g/dL — ABNORMAL LOW (ref 13.0–17.0)
Lymphocytes Relative: 33 %
Lymphs Abs: 2.9 10*3/uL (ref 0.7–4.0)
MCH: 31 pg (ref 26.0–34.0)
MCHC: 32.1 g/dL (ref 30.0–36.0)
MCV: 96.8 fL (ref 78.0–100.0)
MONO ABS: 1.2 10*3/uL — AB (ref 0.1–1.0)
Monocytes Relative: 14 %
Neutro Abs: 3.3 10*3/uL (ref 1.7–7.7)
Neutrophils Relative %: 37 %
PLATELETS: 349 10*3/uL (ref 150–400)
RBC: 3.77 MIL/uL — AB (ref 4.22–5.81)
RDW: 17.6 % — AB (ref 11.5–15.5)
WBC: 8.9 10*3/uL (ref 4.0–10.5)

## 2016-03-02 LAB — MAGNESIUM: Magnesium: 2 mg/dL (ref 1.7–2.4)

## 2016-03-02 LAB — PROTIME-INR
INR: 2.18 — AB (ref 0.00–1.49)
PROTHROMBIN TIME: 24 s — AB (ref 11.6–15.2)

## 2016-03-02 LAB — PHOSPHORUS: Phosphorus: 4.3 mg/dL (ref 2.5–4.6)

## 2016-03-03 ENCOUNTER — Other Ambulatory Visit (HOSPITAL_COMMUNITY): Payer: Self-pay

## 2016-03-03 LAB — PROTIME-INR
INR: 2.62 — ABNORMAL HIGH (ref 0.00–1.49)
Prothrombin Time: 27.6 seconds — ABNORMAL HIGH (ref 11.6–15.2)

## 2016-03-04 LAB — BASIC METABOLIC PANEL
ANION GAP: 12 (ref 5–15)
BUN: 26 mg/dL — ABNORMAL HIGH (ref 6–20)
CHLORIDE: 100 mmol/L — AB (ref 101–111)
CO2: 29 mmol/L (ref 22–32)
Calcium: 9.7 mg/dL (ref 8.9–10.3)
Creatinine, Ser: 0.92 mg/dL (ref 0.61–1.24)
Glucose, Bld: 106 mg/dL — ABNORMAL HIGH (ref 65–99)
POTASSIUM: 4 mmol/L (ref 3.5–5.1)
SODIUM: 141 mmol/L (ref 135–145)

## 2016-03-04 LAB — CBC WITH DIFFERENTIAL/PLATELET
BASOS ABS: 0.1 10*3/uL (ref 0.0–0.1)
BASOS PCT: 2 %
EOS ABS: 0.8 10*3/uL — AB (ref 0.0–0.7)
Eosinophils Relative: 11 %
HCT: 36.1 % — ABNORMAL LOW (ref 39.0–52.0)
HEMOGLOBIN: 11.6 g/dL — AB (ref 13.0–17.0)
LYMPHS ABS: 2.5 10*3/uL (ref 0.7–4.0)
Lymphocytes Relative: 33 %
MCH: 31.8 pg (ref 26.0–34.0)
MCHC: 32.1 g/dL (ref 30.0–36.0)
MCV: 98.9 fL (ref 78.0–100.0)
Monocytes Absolute: 1.2 10*3/uL — ABNORMAL HIGH (ref 0.1–1.0)
Monocytes Relative: 17 %
NEUTROS PCT: 37 %
Neutro Abs: 2.8 10*3/uL (ref 1.7–7.7)
Platelets: 335 10*3/uL (ref 150–400)
RBC: 3.65 MIL/uL — AB (ref 4.22–5.81)
RDW: 17.6 % — ABNORMAL HIGH (ref 11.5–15.5)
WBC: 7.5 10*3/uL (ref 4.0–10.5)

## 2016-03-04 LAB — CK TOTAL AND CKMB (NOT AT ARMC)
CK, MB: 1.2 ng/mL (ref 0.5–5.0)
RELATIVE INDEX: INVALID (ref 0.0–2.5)
Total CK: 28 U/L — ABNORMAL LOW (ref 49–397)

## 2016-03-04 LAB — PROTIME-INR
INR: 2.7 — AB (ref 0.00–1.49)
Prothrombin Time: 28.3 seconds — ABNORMAL HIGH (ref 11.6–15.2)

## 2016-03-04 LAB — PHOSPHORUS: Phosphorus: 4.6 mg/dL (ref 2.5–4.6)

## 2016-03-04 LAB — MAGNESIUM: MAGNESIUM: 2 mg/dL (ref 1.7–2.4)

## 2016-03-04 LAB — TROPONIN I: Troponin I: 0.03 ng/mL (ref <0.031)

## 2016-03-05 LAB — PROTIME-INR
INR: 2.7 — ABNORMAL HIGH (ref 0.00–1.49)
Prothrombin Time: 28.3 seconds — ABNORMAL HIGH (ref 11.6–15.2)

## 2016-03-06 LAB — PROTIME-INR
INR: 2.47 — AB (ref 0.00–1.49)
PROTHROMBIN TIME: 26.5 s — AB (ref 11.6–15.2)

## 2016-03-07 ENCOUNTER — Institutional Professional Consult (permissible substitution) (HOSPITAL_COMMUNITY): Payer: Self-pay

## 2016-03-07 LAB — CBC
HCT: 40.3 % (ref 39.0–52.0)
Hemoglobin: 13.8 g/dL (ref 13.0–17.0)
MCH: 33.5 pg (ref 26.0–34.0)
MCHC: 34.2 g/dL (ref 30.0–36.0)
MCV: 97.8 fL (ref 78.0–100.0)
PLATELETS: 327 10*3/uL (ref 150–400)
RBC: 4.12 MIL/uL — AB (ref 4.22–5.81)
RDW: 17.8 % — AB (ref 11.5–15.5)
WBC: 9.9 10*3/uL (ref 4.0–10.5)

## 2016-03-07 LAB — BASIC METABOLIC PANEL
ANION GAP: 10 (ref 5–15)
BUN: 28 mg/dL — ABNORMAL HIGH (ref 6–20)
CALCIUM: 9.6 mg/dL (ref 8.9–10.3)
CO2: 28 mmol/L (ref 22–32)
Chloride: 100 mmol/L — ABNORMAL LOW (ref 101–111)
Creatinine, Ser: 0.89 mg/dL (ref 0.61–1.24)
GLUCOSE: 104 mg/dL — AB (ref 65–99)
Potassium: 4 mmol/L (ref 3.5–5.1)
SODIUM: 138 mmol/L (ref 135–145)

## 2016-03-07 LAB — PROTIME-INR
INR: 2.35 — AB (ref 0.00–1.49)
PROTHROMBIN TIME: 25.5 s — AB (ref 11.6–15.2)

## 2016-03-08 LAB — CBC
HCT: 39 % (ref 39.0–52.0)
Hemoglobin: 12.8 g/dL — ABNORMAL LOW (ref 13.0–17.0)
MCH: 32.3 pg (ref 26.0–34.0)
MCHC: 32.8 g/dL (ref 30.0–36.0)
MCV: 98.5 fL (ref 78.0–100.0)
PLATELETS: 329 10*3/uL (ref 150–400)
RBC: 3.96 MIL/uL — AB (ref 4.22–5.81)
RDW: 17.6 % — ABNORMAL HIGH (ref 11.5–15.5)
WBC: 8.9 10*3/uL (ref 4.0–10.5)

## 2016-03-08 LAB — BASIC METABOLIC PANEL
Anion gap: 8 (ref 5–15)
BUN: 32 mg/dL — AB (ref 6–20)
CALCIUM: 9.4 mg/dL (ref 8.9–10.3)
CO2: 29 mmol/L (ref 22–32)
Chloride: 98 mmol/L — ABNORMAL LOW (ref 101–111)
Creatinine, Ser: 0.99 mg/dL (ref 0.61–1.24)
GFR calc Af Amer: >60 mL/min (ref >60)
Glucose, Bld: 196 mg/dL — ABNORMAL HIGH (ref 65–99)
POTASSIUM: 3.6 mmol/L (ref 3.5–5.1)
SODIUM: 135 mmol/L (ref 135–145)

## 2016-03-08 LAB — PROTIME-INR
INR: 2.87 — ABNORMAL HIGH (ref 0.00–1.49)
PROTHROMBIN TIME: 29.6 s — AB (ref 11.6–15.2)

## 2016-03-09 DIAGNOSIS — E46 Unspecified protein-calorie malnutrition: Secondary | ICD-10-CM | POA: Diagnosis not present

## 2016-03-09 DIAGNOSIS — I213 ST elevation (STEMI) myocardial infarction of unspecified site: Secondary | ICD-10-CM | POA: Diagnosis not present

## 2016-03-09 DIAGNOSIS — I6789 Other cerebrovascular disease: Secondary | ICD-10-CM | POA: Diagnosis not present

## 2016-03-09 DIAGNOSIS — J188 Other pneumonia, unspecified organism: Secondary | ICD-10-CM | POA: Diagnosis not present

## 2016-03-09 DIAGNOSIS — S06359D Traumatic hemorrhage of left cerebrum with loss of consciousness of unspecified duration, subsequent encounter: Secondary | ICD-10-CM | POA: Diagnosis not present

## 2016-03-09 DIAGNOSIS — Z79899 Other long term (current) drug therapy: Secondary | ICD-10-CM | POA: Diagnosis not present

## 2016-03-09 DIAGNOSIS — T819XXA Unspecified complication of procedure, initial encounter: Secondary | ICD-10-CM | POA: Diagnosis not present

## 2016-03-09 DIAGNOSIS — J9600 Acute respiratory failure, unspecified whether with hypoxia or hypercapnia: Secondary | ICD-10-CM | POA: Diagnosis not present

## 2016-03-09 DIAGNOSIS — T8189XA Other complications of procedures, not elsewhere classified, initial encounter: Secondary | ICD-10-CM | POA: Diagnosis not present

## 2016-03-09 DIAGNOSIS — D649 Anemia, unspecified: Secondary | ICD-10-CM | POA: Diagnosis not present

## 2016-03-09 DIAGNOSIS — D62 Acute posthemorrhagic anemia: Secondary | ICD-10-CM | POA: Diagnosis not present

## 2016-03-09 DIAGNOSIS — F329 Major depressive disorder, single episode, unspecified: Secondary | ICD-10-CM | POA: Diagnosis not present

## 2016-03-09 DIAGNOSIS — I5022 Chronic systolic (congestive) heart failure: Secondary | ICD-10-CM | POA: Diagnosis not present

## 2016-03-09 DIAGNOSIS — Z7901 Long term (current) use of anticoagulants: Secondary | ICD-10-CM | POA: Diagnosis not present

## 2016-03-09 DIAGNOSIS — J9503 Malfunction of tracheostomy stoma: Secondary | ICD-10-CM | POA: Diagnosis not present

## 2016-03-09 DIAGNOSIS — Z87891 Personal history of nicotine dependence: Secondary | ICD-10-CM | POA: Diagnosis not present

## 2016-03-09 DIAGNOSIS — Z794 Long term (current) use of insulin: Secondary | ICD-10-CM | POA: Diagnosis not present

## 2016-03-09 DIAGNOSIS — Z931 Gastrostomy status: Secondary | ICD-10-CM | POA: Diagnosis not present

## 2016-03-09 DIAGNOSIS — I429 Cardiomyopathy, unspecified: Secondary | ICD-10-CM | POA: Diagnosis not present

## 2016-03-09 DIAGNOSIS — I6930 Unspecified sequelae of cerebral infarction: Secondary | ICD-10-CM | POA: Diagnosis not present

## 2016-03-09 DIAGNOSIS — A4102 Sepsis due to Methicillin resistant Staphylococcus aureus: Secondary | ICD-10-CM | POA: Diagnosis not present

## 2016-03-09 DIAGNOSIS — S3600XA Unspecified injury of spleen, initial encounter: Secondary | ICD-10-CM | POA: Diagnosis not present

## 2016-03-09 DIAGNOSIS — J9690 Respiratory failure, unspecified, unspecified whether with hypoxia or hypercapnia: Secondary | ICD-10-CM | POA: Diagnosis not present

## 2016-03-09 DIAGNOSIS — R1310 Dysphagia, unspecified: Secondary | ICD-10-CM | POA: Diagnosis not present

## 2016-03-09 DIAGNOSIS — I69391 Dysphagia following cerebral infarction: Secondary | ICD-10-CM | POA: Diagnosis not present

## 2016-03-09 DIAGNOSIS — J9601 Acute respiratory failure with hypoxia: Secondary | ICD-10-CM | POA: Diagnosis not present

## 2016-03-09 DIAGNOSIS — J690 Pneumonitis due to inhalation of food and vomit: Secondary | ICD-10-CM | POA: Diagnosis not present

## 2016-03-09 DIAGNOSIS — I509 Heart failure, unspecified: Secondary | ICD-10-CM | POA: Diagnosis not present

## 2016-03-09 DIAGNOSIS — J9500 Unspecified tracheostomy complication: Secondary | ICD-10-CM | POA: Diagnosis not present

## 2016-03-09 DIAGNOSIS — Z930 Tracheostomy status: Secondary | ICD-10-CM | POA: Diagnosis not present

## 2016-03-09 DIAGNOSIS — T82519A Breakdown (mechanical) of unspecified cardiac and vascular devices and implants, initial encounter: Secondary | ICD-10-CM | POA: Diagnosis not present

## 2016-03-09 DIAGNOSIS — Z9081 Acquired absence of spleen: Secondary | ICD-10-CM | POA: Diagnosis not present

## 2016-03-09 DIAGNOSIS — I110 Hypertensive heart disease with heart failure: Secondary | ICD-10-CM | POA: Diagnosis not present

## 2016-03-09 DIAGNOSIS — Z430 Encounter for attention to tracheostomy: Secondary | ICD-10-CM | POA: Diagnosis not present

## 2016-03-09 LAB — PROTIME-INR
INR: 2.6 — AB (ref 0.00–1.49)
PROTHROMBIN TIME: 27.5 s — AB (ref 11.6–15.2)

## 2016-03-10 ENCOUNTER — Encounter: Payer: Self-pay | Admitting: Internal Medicine

## 2016-03-10 ENCOUNTER — Non-Acute Institutional Stay (SKILLED_NURSING_FACILITY): Payer: Medicare Other | Admitting: Internal Medicine

## 2016-03-10 DIAGNOSIS — J9600 Acute respiratory failure, unspecified whether with hypoxia or hypercapnia: Secondary | ICD-10-CM | POA: Insufficient documentation

## 2016-03-10 DIAGNOSIS — A4102 Sepsis due to Methicillin resistant Staphylococcus aureus: Secondary | ICD-10-CM

## 2016-03-10 DIAGNOSIS — D62 Acute posthemorrhagic anemia: Secondary | ICD-10-CM

## 2016-03-10 DIAGNOSIS — Z931 Gastrostomy status: Secondary | ICD-10-CM

## 2016-03-10 DIAGNOSIS — I429 Cardiomyopathy, unspecified: Secondary | ICD-10-CM | POA: Insufficient documentation

## 2016-03-10 DIAGNOSIS — S06369A Traumatic hemorrhage of cerebrum, unspecified, with loss of consciousness of unspecified duration, initial encounter: Secondary | ICD-10-CM | POA: Insufficient documentation

## 2016-03-10 DIAGNOSIS — Z9081 Acquired absence of spleen: Secondary | ICD-10-CM | POA: Diagnosis not present

## 2016-03-10 DIAGNOSIS — F32A Depression, unspecified: Secondary | ICD-10-CM

## 2016-03-10 DIAGNOSIS — Z930 Tracheostomy status: Secondary | ICD-10-CM | POA: Diagnosis not present

## 2016-03-10 DIAGNOSIS — I5022 Chronic systolic (congestive) heart failure: Secondary | ICD-10-CM | POA: Diagnosis not present

## 2016-03-10 DIAGNOSIS — J9601 Acute respiratory failure with hypoxia: Secondary | ICD-10-CM

## 2016-03-10 DIAGNOSIS — E46 Unspecified protein-calorie malnutrition: Secondary | ICD-10-CM | POA: Diagnosis not present

## 2016-03-10 DIAGNOSIS — I69391 Dysphagia following cerebral infarction: Secondary | ICD-10-CM | POA: Diagnosis not present

## 2016-03-10 DIAGNOSIS — J690 Pneumonitis due to inhalation of food and vomit: Secondary | ICD-10-CM

## 2016-03-10 DIAGNOSIS — S0633AA Contusion and laceration of cerebrum, unspecified, with loss of consciousness status unknown, initial encounter: Secondary | ICD-10-CM | POA: Insufficient documentation

## 2016-03-10 DIAGNOSIS — S06359D Traumatic hemorrhage of left cerebrum with loss of consciousness of unspecified duration, subsequent encounter: Secondary | ICD-10-CM

## 2016-03-10 DIAGNOSIS — F329 Major depressive disorder, single episode, unspecified: Secondary | ICD-10-CM | POA: Insufficient documentation

## 2016-03-10 DIAGNOSIS — I213 ST elevation (STEMI) myocardial infarction of unspecified site: Secondary | ICD-10-CM | POA: Diagnosis not present

## 2016-03-10 DIAGNOSIS — I10 Essential (primary) hypertension: Secondary | ICD-10-CM | POA: Insufficient documentation

## 2016-03-10 DIAGNOSIS — E785 Hyperlipidemia, unspecified: Secondary | ICD-10-CM

## 2016-03-10 DIAGNOSIS — I513 Intracardiac thrombosis, not elsewhere classified: Secondary | ICD-10-CM

## 2016-03-10 NOTE — Progress Notes (Signed)
MRN: [id number] Name: Curtis Keller  Sex: male Age: 60 y.o. DOB: 02-12-1956  PSC #: Ronni Rumble  Facility/Room: 118 A Level Of Care: SNF Provider: Randon Goldsmith. Lyn Hollingshead, MD Emergency Contacts: Extended Emergency Contact Information Primary Emergency Contact: Lawson Fiscal of [place] Home Phone: [phone] Relation: Sister Secondary Emergency Contact: Wynell Balloon [place] of [place] Home Phone: [phone] Relation: Sister  Code Status: Full Code  Allergies: Codeine; Hydrocodone; Hydrocodone-acetaminophen; Lisinopril; and Tylenol  Chief Complaint  Patient presents with  . New Admit To SNF    Admission to facility    HPI: Patient is 60 y.o. male with prior CVA with L hemiparesis and CHF with mural thrombus on coumadin who was hospitalized at [facility], then [facility] from 3/22-5/16 for a fall at a SNF while hypertheraputic on `and suffered a new CVA from traumatic hematomas on the L and a splenectomy 2/2 to splenic laceration and contusion. Hospital course was complicated by ABLA and recurrent aspiration PNAs. Once at [facility] pt continued to aspirate with PNA until eventually he has a MRSA+ PNA with sepsis. At this point pt was treated with a trach and a PEG tube which stopped the PNAs. Pt's course was further complicated bi MRSA+ parotitis which resolved with IV abx. Pt is admitted to SNF with generalized weakness for OT/PT. While at SNF pt will be followed for chrinic CHF, tc with metoprolol, apical thrombus tx wuth coumadin and depression, tx with prozac and elavil.  Past Medical History  Diagnosis Date  . Hypertension   . Depression   . CHF (congestive heart failure) (HCC)   . Acute respiratory failure (HCC)   . Status post tracheostomy (HCC)   . Cardiomyopathy (HCC)   . Apical mural thrombus (HCC)   . Protein calorie malnutrition [facility])     Past Surgical History  Procedure Laterality Date  . Tracheostomy tube placement N/A 02/22/2016     Procedure: TRACHEOSTOMY;  Surgeon: [name], MD;  Location: [facility] OR;  Service: ENT;  Laterality: N/A;      Medication List       This list is accurate as of: 03/10/16 11:59 PM.  Always use your most recent med list.               amitriptyline 50 MG tablet  Commonly known as:  ELAVIL  Take 50 mg by mouth at bedtime. @@ 2200     famotidine 20 MG tablet  Commonly known as:  PEPCID  Take 20 mg by mouth 2 (two) times daily. 10,22     feeding supplement (PRO-STAT SUGAR FREE 64) Liqd  Take 30 mLs by mouth daily.     FISH OIL PO  Take by mouth. Reported on 03/10/2016     HUMALOG 100 UNIT/ML injection  Generic drug:  insulin lispro  once. Use Sliding Scale every 6 hours ; for blood glucose below 70 give 0 un, low dose from 70 to 150 , give 0  Un, from 151 to 200 give 2 un, from 201 to 250 give 4 un, from 251 to 300 give 6 un, from 301 to 350 give 8 un, from 351 to 400 give 10 un, for blood glucose above 400, give 12 un call MD and obtain stat lab verification     ipratropium-albuterol 0.5-2.5 (3) MG/3ML Soln  Commonly known as:  DUONEB  Take 3 mLs by nebulization every 6 (six) hours as needed.     metoprolol succinate 25 MG 24 hr tablet  Commonly known  as:  TOPROL-XL  Take 25 mg by mouth 2 (two) times daily.     nicotine 14 mg/24hr patch  Commonly known as:  NICODERM CQ - dosed in mg/24 hours  Place 14 mg onto the skin daily.     polyethylene glycol packet  Commonly known as:  MIRALAX / GLYCOLAX  Take 17 g by mouth 2 (two) times daily.     pregabalin 50 MG capsule  Commonly known as:  LYRICA  Take 50 mg by mouth 2 (two) times daily.     vitamin C 500 MG tablet  Commonly known as:  ASCORBIC ACID  Take 500 mg by mouth 2 (two) times daily. 10,22     warfarin 7.5 MG tablet  Commonly known as:  COUMADIN  Take 7.5 mg by mouth daily. Hold for bleeding and notify MD or Pharmacist     zinc sulfate 220 (50 Zn) MG capsule  Take 220 mg by mouth daily. At 1800         Meds ordered this encounter  Medications  . amitriptyline (ELAVIL) 50 MG tablet    Sig: Take 50 mg by mouth at bedtime. @@ 2200  . warfarin (COUMADIN) 7.5 MG tablet    Sig: Take 7.5 mg by mouth daily. Hold for bleeding and notify MD or Pharmacist  . ipratropium-albuterol (DUONEB) 0.5-2.5 (3) MG/3ML SOLN    Sig: Take 3 mLs by nebulization every 6 (six) hours as needed.  . nicotine (NICODERM CQ - DOSED IN MG/24 HOURS) 14 mg/24hr patch    Sig: Place 14 mg onto the skin daily.  . metoprolol succinate (TOPROL-XL) 25 MG 24 hr tablet    Sig: Take 25 mg by mouth 2 (two) times daily.  Marland Kitchen zinc sulfate 220 (50 Zn) MG capsule    Sig: Take 220 mg by mouth daily. At 1800  . vitamin C (ASCORBIC ACID) 500 MG tablet    Sig: Take 500 mg by mouth 2 (two) times daily. 10,22  . polyethylene glycol (MIRALAX / GLYCOLAX) packet    Sig: Take 17 g by mouth 2 (two) times daily.  . famotidine (PEPCID) 20 MG tablet    Sig: Take 20 mg by mouth 2 (two) times daily. 10,22  . Amino Acids-Protein Hydrolys (FEEDING SUPPLEMENT, PRO-STAT SUGAR FREE 64,) LIQD    Sig: Take 30 mLs by mouth daily.  . Omega-3 Fatty Acids (FISH OIL PO)    Sig: Take by mouth. Reported on 03/10/2016  . insulin lispro (HUMALOG) 100 UNIT/ML injection    Sig: once. Use Sliding Scale every 6 hours ; for blood glucose below 70 give 0 un, low dose from 70 to 150 , give 0  Un, from 151 to 200 give 2 un, from 201 to 250 give 4 un, from 251 to 300 give 6 un, from 301 to 350 give 8 un, from 351 to 400 give 10 un, for blood glucose above 400, give 12 un call MD and obtain stat lab verification  . pregabalin (LYRICA) 50 MG capsule    Sig: Take 50 mg by mouth 2 (two) times daily.     There is no immunization history on file for this patient.  Social History  Substance Use Topics  . Smoking status: Former Games developer  . Smokeless tobacco: Not on file  . Alcohol Use: Not on file    Family history is   Family History  Problem Relation Age of Onset  .  Alcohol abuse Mother   . Alcohol abuse Father   .  Alcohol abuse Maternal Uncle       Review of Systems  DATA OBTAINED: from patient, sister GENERAL:  no fevers, fatigue; pt is hungry! He could eat double what he is eating now SKIN: No itching, rash  EYES: No eye pain, redness, discharge EARS: No earache, tinnitus, change in hearing NOSE: No congestion, drainage or bleeding  MOUTH/THROAT: No mouth or tooth pain, No sore throat; trach RESPIRATORY: No cough, wheezing, SOB CARDIAC: No chest pain, palpitations, lower extremity edema  GI: No abdominal pain, No N/V/D or constipation, No heartburn or reflux  GU: No dysuria, frequency or urgency, or incontinence  MUSCULOSKELETAL: No unrelieved bone/joint pain NEUROLOGIC: No headache, dizziness or new focal weakness PSYCHIATRIC: No c/o anxiety or sadness   Filed Vitals:   03/10/16 1038  BP: 115/68  Pulse: 70  Temp: 97.1 F (36.2 C)  Resp: 19    SpO2 Readings from Last 1 Encounters:  03/10/16 93%        Physical Exam  GENERAL APPEARANCE: Alert, non conversant,  No acute distress.  SKIN: No diaphoresis rash HEAD: Normocephalic, atraumatic  EYES: Conjunctiva/lids clear. Pupils round, reactive. EOMs intact.  EARS: External exam WNL, canals clear. Hearing grossly normal.  NOSE: No deformity or discharge.  MOUTH/THROAT: Lips w/o lesions ; trach with O2 RESPIRATORY: Breathing is even, unlabored. Lung sounds are clear   CARDIOVASCULAR: Heart RRR no murmurs, rubs or gallops. No peripheral edema.   GASTROINTESTINAL: Abdomen is soft, non-tender, not distended w/ normal bowel sounds; PEG GENITOURINARY: Bladder non tender, not distended  MUSCULOSKELETAL: No abnormal joints or musculature NEUROLOGIC:  Cranial nerves 2-12 grossly intact; L side weakness PSYCHIATRIC: Mood and affect appropriate to situation, no behavioral issues  Patient Active Problem List   Diagnosis Date Noted  . S/P splenectomy 03/13/2016  . PEG (percutaneous  endoscopic gastrostomy) status (HCC) 03/13/2016  . MRSA (methicillin resistant Staphylococcus aureus) septicemia (HCC) 03/13/2016  . Aspiration pneumonia (HCC) 03/13/2016  . Dysphagia following cerebrovascular accident 03/13/2016  . Acute blood loss anemia 03/13/2016  . Hyperlipidemia 03/13/2016  . Heart failure, chronic systolic (HCC)   . Hypertension   . Depression   . Acute respiratory failure (HCC)   . Status post tracheostomy (HCC)   . Cardiomyopathy (HCC)   . Apical mural thrombus (HCC)   . Protein calorie malnutrition (HCC)   . Intraparenchymal hematoma of brain due to trauma [facility])        Component Value Date/Time   WBC 8.9 03/08/2016 0951   RBC 3.96* 03/08/2016 0951   HGB 12.8* 03/08/2016 0951   HCT 39.0 03/08/2016 0951   PLT 329 03/08/2016 0951   MCV 98.5 03/08/2016 0951   LYMPHSABS 2.5 03/04/2016 0720   MONOABS 1.2* 03/04/2016 0720   EOSABS 0.8* 03/04/2016 0720   BASOSABS 0.1 03/04/2016 0720        Component Value Date/Time   NA 135 03/08/2016 0951   K 3.6 03/08/2016 0951   CL 98* 03/08/2016 0951   CO2 29 03/08/2016 0951   GLUCOSE 196* 03/08/2016 0951   BUN 32* 03/08/2016 0951   CREATININE 0.99 03/08/2016 0951   CALCIUM 9.4 03/08/2016 0951   PROT 6.7 01/25/2016 0901   ALBUMIN 1.7* 01/25/2016 0901   AST 47* 01/25/2016 0901   ALT 34 01/25/2016 0901   ALKPHOS 118 01/25/2016 0901   BILITOT 1.2 01/25/2016 0901   GFRNONAA >60 03/08/2016 0951   GFRAA >60 03/08/2016 0951    Lab Results  Component Value Date   HGBA1C 5.2 02/06/2016  No results found for: CHOL, HDL, LDLCALC, LDLDIRECT, TRIG, CHOLHDL   Dg Chest Port 1 View  01/14/2016  CLINICAL DATA:  60 year old male with respiratory failure and shortness of breath EXAM: PORTABLE CHEST 1 VIEW COMPARISON:  Radiograph dated 01/12/2016 FINDINGS: An enteric tube is partially visualized coursing towards the left upper abdomen. There has been interval improvement of the previously seen bilateral airspace  opacities with improved aeration of the lungs. There is no significant pleural effusion. No pneumothorax. The cardiac silhouette is within normal limits. No acute osseous pathology. IMPRESSION: Interval improvement of the bilateral airspace opacities. Follow-up recommended. Electronically Signed   By: Elgie Collard M.D.   On: 01/14/2016 00:13   Dg Abd Portable 1v  01/14/2016  CLINICAL DATA:  60 year old male with enteric tube placement. EXAM: PORTABLE ABDOMEN - 1 VIEW COMPARISON:  Radiograph dated 01/13/2016 FINDINGS: An enteric tube is noted with tip positioned over the inferior aspect of the T12 vertebra likely within the stomach. There is no evidence of bowel dilatation or free air. Patchy airspace densities noted at the lung bases. Cutaneous surgical clips in the lower abdomen. IMPRESSION: Enteric tube in the epigastric area likely within the stomach. Electronically Signed   By: Elgie Collard M.D.   On: 01/14/2016 00:31   Dg Abd Portable 1v  01/13/2016  CLINICAL DATA:  Feeding tube placement EXAM: PORTABLE ABDOMEN - 1 VIEW COMPARISON:  Study obtained earlier in the day FINDINGS: Feeding tube tip in stomach. Bowel gas pattern unremarkable. No free air evident. There is patchy airspace consolidation in the lower lung zone regions. IMPRESSION: Feeding tube tip in stomach. Visualized bowel gas pattern unremarkable. Electronically Signed   By: Bretta Bang III M.D.   On: 01/13/2016 21:03    Not all labs, radiology exams or other studies done during hospitalization come through on my EPIC note; however they are reviewed by me.    Assessment and Plan  CVA 2/2 brain hematoma from trauma/dysphagia 2/2 CVA/aspiration PNA - pt continued to develop aspiration PNA over and over until pt had a trach placed 28% O2 with O2 sat 95% and PEG tube placed MRSA (methicillin resistant Staphylococcus aureus) septicemia (HCC) 2/2 to aspiration PNA; clearefd with IV abx along with MTSA+ parotitis  Acute  respiratory failure (HCC) 2/2 to multiple aspiration PNAs in setting ot trauma; SNF - improved with trach and 28% O2;will cont   Heart failure, chronic systolic (HCC) SNF chronic and stable- cont metoprolol 25 mg BID  Apical mural thrombus (HCC) SNF - 2/2 chronic CHF - cont coumadin which will be actively titrated  Acute blood loss anemia 2/2 to trauma during supratheraputic anticoagulation  With cerebral hematomas and splenic laceration; d/c Hb was 12.8 ; SNF - f/u CBC  Protein calorie malnutrition (HCC) SNF - this will get better-according to pt and sister pt is hungry all the time, not getting enough to eat; have ordered double portions everything, every meal and strawberry ensure between meals to start; pt already on prostat BID  Depression SNF =Chronic and stable- cont prozac and bedtime elavil  Hyperlipidemia SNF - Not stated as uncontrolled ; cont lipitor and fish oil   Tinme spent > 45 min;> 50% of time with patient was spent reviewing records, labs, tests and studies, counseling and developing plan of care  Thurston Hole D. Lyn Hollingshead, MD

## 2016-03-13 ENCOUNTER — Encounter: Payer: Self-pay | Admitting: Internal Medicine

## 2016-03-13 DIAGNOSIS — Z931 Gastrostomy status: Secondary | ICD-10-CM | POA: Insufficient documentation

## 2016-03-13 DIAGNOSIS — A4102 Sepsis due to Methicillin resistant Staphylococcus aureus: Secondary | ICD-10-CM | POA: Insufficient documentation

## 2016-03-13 DIAGNOSIS — E785 Hyperlipidemia, unspecified: Secondary | ICD-10-CM | POA: Insufficient documentation

## 2016-03-13 DIAGNOSIS — R1310 Dysphagia, unspecified: Secondary | ICD-10-CM | POA: Insufficient documentation

## 2016-03-13 DIAGNOSIS — J690 Pneumonitis due to inhalation of food and vomit: Secondary | ICD-10-CM | POA: Insufficient documentation

## 2016-03-13 DIAGNOSIS — D62 Acute posthemorrhagic anemia: Secondary | ICD-10-CM | POA: Insufficient documentation

## 2016-03-13 DIAGNOSIS — Z9081 Acquired absence of spleen: Secondary | ICD-10-CM | POA: Insufficient documentation

## 2016-03-13 NOTE — Assessment & Plan Note (Signed)
SNF =Chronic and stable- cont prozac and bedtime elavil

## 2016-03-13 NOTE — Assessment & Plan Note (Signed)
SNF - Not stated as uncontrolled ; cont lipitor and fish oil

## 2016-03-13 NOTE — Assessment & Plan Note (Signed)
2/2 to trauma during supratheraputic anticoagulation  With cerebral hematomas and splenic laceration; d/c Hb was 12.8 ; SNF - f/u CBC

## 2016-03-13 NOTE — Assessment & Plan Note (Signed)
2/2 to multiple aspiration PNAs in setting ot trauma; SNF - improved with trach and 28% O2;will cont

## 2016-03-13 NOTE — Assessment & Plan Note (Addendum)
SNF - this will get better-according to pt and sister pt is hungry all the time, not getting enough to eat; have ordered double portions everything, every meal and strawberry ensure between meals to start; pt already on prostat BID

## 2016-03-13 NOTE — Assessment & Plan Note (Signed)
SNF chronic and stable- cont metoprolol 25 mg BID

## 2016-03-13 NOTE — Assessment & Plan Note (Signed)
2/2 to aspiration PNA; clearefd with IV abx along with MTSA+ parotitis

## 2016-03-13 NOTE — Assessment & Plan Note (Signed)
SNF - 2/2 chronic CHF - cont coumadin which will be actively titrated

## 2016-03-15 LAB — BASIC METABOLIC PANEL
BUN: 18 mg/dL (ref 4–21)
CREATININE: 1 mg/dL (ref 0.6–1.3)
GLUCOSE: 155 mg/dL
POTASSIUM: 4.3 mmol/L (ref 3.4–5.3)
SODIUM: 137 mmol/L (ref 137–147)

## 2016-03-15 LAB — CBC AND DIFFERENTIAL
HCT: 36 % — AB (ref 41–53)
HEMOGLOBIN: 11.4 g/dL — AB (ref 13.5–17.5)
Platelets: 303 10*3/uL (ref 150–399)
WBC: 9.7 10^3/mL

## 2016-03-15 LAB — PROTIME-INR: PROTIME: 45.9 s — AB (ref 10.0–13.8)

## 2016-03-15 LAB — POCT INR: INR: 4.9 — AB (ref 0.9–1.1)

## 2016-03-16 LAB — PROTIME-INR: Protime: 43.1 s — AB (ref 10.0–13.8)

## 2016-03-16 LAB — POCT INR: INR: 4.5 — AB (ref 0.9–1.1)

## 2016-03-18 LAB — POCT INR: INR: 2.3 — AB (ref 0.9–1.1)

## 2016-03-18 LAB — PROTIME-INR: Protime: 25.5 seconds — AB (ref 10.0–13.8)

## 2016-03-22 ENCOUNTER — Encounter: Payer: Self-pay | Admitting: Adult Health

## 2016-03-22 ENCOUNTER — Non-Acute Institutional Stay (SKILLED_NURSING_FACILITY): Payer: Medicare Other | Admitting: Adult Health

## 2016-03-22 DIAGNOSIS — Z7901 Long term (current) use of anticoagulants: Secondary | ICD-10-CM | POA: Insufficient documentation

## 2016-03-22 DIAGNOSIS — I513 Intracardiac thrombosis, not elsewhere classified: Secondary | ICD-10-CM

## 2016-03-22 DIAGNOSIS — S06359D Traumatic hemorrhage of left cerebrum with loss of consciousness of unspecified duration, subsequent encounter: Secondary | ICD-10-CM | POA: Diagnosis not present

## 2016-03-22 DIAGNOSIS — I213 ST elevation (STEMI) myocardial infarction of unspecified site: Secondary | ICD-10-CM | POA: Diagnosis not present

## 2016-03-22 LAB — PROTIME-INR: PROTIME: 28.5 s — AB (ref 10.0–13.8)

## 2016-03-22 LAB — POCT INR: INR: 2.7 — AB (ref 0.9–1.1)

## 2016-03-22 NOTE — Progress Notes (Signed)
Patient ID: Curtis Keller, male   DOB: 03-22-1956, 60 y.o.   MRN: [id number]   Location:  [facility] Room Number: 118-A Place of Service:  SNF (31)   CODE STATUS: Full Code  Allergies  Allergen Reactions  . Codeine Anaphylaxis and Nausea And Vomiting  . Hydrocodone Other (See Comments)  . Hydrocodone-Acetaminophen Nausea And Vomiting  . Lisinopril Itching and Rash  . Tylenol [Acetaminophen] Nausea Only    Chief Complaint  Patient presents with  . Acute Visit    Coumadin Management    HPI:  He is long term coumadin therapy for apical mural thrombus. His inr is therapeutic at 2.7; with the previous reading at 2.3. He is unable to fully participate in the hpi or ros. There are no nursing concerns at this time.   Past Medical History  Diagnosis Date  . Hypertension   . Depression   . CHF (congestive heart failure) (HCC)   . Acute respiratory failure (HCC)   . Status post tracheostomy (HCC)   . Cardiomyopathy (HCC)   . Apical mural thrombus (HCC)   . Protein calorie malnutrition [facility])     Past Surgical History  Procedure Laterality Date  . Tracheostomy tube placement N/A 02/22/2016    Procedure: TRACHEOSTOMY;  Surgeon: [name], MD;  Location: [facility] OR;  Service: ENT;  Laterality: N/A;    Social History   Social History  . Marital Status: Single    Spouse Name: N/A  . Number of Children: N/A  . Years of Education: N/A   Occupational History  . Not on file.   Social History Main Topics  . Smoking status: Former Games developer  . Smokeless tobacco: Not on file  . Alcohol Use: Not on file  . Drug Use: Not on file  . Sexual Activity: Not on file   Other Topics Concern  . Not on file   Social History Narrative   Family History  Problem Relation Age of Onset  . Alcohol abuse Mother   . Alcohol abuse Father   . Alcohol abuse Maternal Uncle       VITAL SIGNS BP 108/59 mmHg  Pulse 63  Temp(Src) 97.2 F (36.2 C)  (Oral)  Resp 18  Ht 6\' 3"  (1.905 m)  Wt 200 lb (90.719 kg)  BMI 25.00 kg/m2  SpO2 98%  Patient's Medications  New Prescriptions   No medications on file  Previous Medications   AMINO ACIDS-PROTEIN HYDROLYS (FEEDING SUPPLEMENT, PRO-STAT SUGAR FREE 64,) LIQD    Take 30 mLs by mouth daily.   AMITRIPTYLINE (ELAVIL) 50 MG TABLET    Take 50 mg by mouth at bedtime. @@ 2200   ATORVASTATIN (LIPITOR) 40 MG TABLET    Take 40 mg by mouth at bedtime.   FAMOTIDINE (PEPCID) 20 MG TABLET    Take 20 mg by mouth 2 (two) times daily. 10,22   FLUOXETINE (PROZAC) 40 MG CAPSULE    Take 40 mg by mouth daily.   INSULIN LISPRO (HUMALOG) 100 UNIT/ML INJECTION    once. Use Sliding Scale every 6 hours ; for blood glucose below 70 give 0 un, low dose from 70 to 150 , give 0  Un, from 151 to 200 give 2 un, from 201 to 250 give 4 un, from 251 to 300 give 6 un, from 301 to 350 give 8 un, from 351 to 400 give 10 un, for blood glucose above 400, give 12 un call MD and obtain stat lab  verification   IPRATROPIUM-ALBUTEROL (DUONEB) 0.5-2.5 (3) MG/3ML SOLN    Take 3 mLs by nebulization every 6 (six) hours as needed.   LORAZEPAM (ATIVAN) 0.5 MG TABLET    Take 0.5 mg by mouth every 8 (eight) hours as needed for anxiety.   METOPROLOL SUCCINATE (TOPROL-XL) 25 MG 24 HR TABLET    Take 25 mg by mouth 2 (two) times daily.   NICOTINE (NICODERM CQ - DOSED IN MG/24 HOURS) 14 MG/24HR PATCH    Place 14 mg onto the skin daily.   OMEGA-3 FATTY ACIDS (FISH OIL PO)    Take by mouth. Reported on 03/10/2016   POLYETHYLENE GLYCOL (MIRALAX / GLYCOLAX) PACKET    Take 17 g by mouth 2 (two) times daily.   PREGABALIN (LYRICA) 50 MG CAPSULE    Take 50 mg by mouth 2 (two) times daily.   VITAMIN C (ASCORBIC ACID) 500 MG TABLET    Take 500 mg by mouth 2 (two) times daily. 10,22   WARFARIN (COUMADIN) 5 MG TABLET    Take 5 mg by mouth at bedtime.   ZINC SULFATE 220 (50 ZN) MG CAPSULE    Take 220 mg by mouth daily. At 1800  Modified Medications   No  medications on file  Discontinued Medications   WARFARIN (COUMADIN) 7.5 MG TABLET    Take 7.5 mg by mouth daily. Hold for bleeding and notify MD or Pharmacist     SIGNIFICANT DIAGNOSTIC EXAMS  02-24-16: chest x-ray: 1. Tracheostomy tube tip in satisfactory position approximately 5 cm above the carina. 2. Significant reduction in pneumomediastinum and subcutaneous emphysema since the examination 2 days ago. 3. Improved interstitial pulmonary edema, though mild interstitial edema persists. 4. New bibasilar atelectasis and new dense atelectasis and/or pneumonia in the right lower lobe.  LABS REVIEWED:   02-06-16: hgb a1c 5.2 03-08-16: wbc 8.9; gb 12.8; hct 39.0; mcv 98.5 plt 329; glucose 196; bun 32; creat 0.99; k+ 3.6; na++ 135  03-18-16: inr 2.3 03-22-16: inr 2.7: coumadin 5 mg daily      Review of Systems  Unable to perform ROS: other    Physical Exam  Constitutional: No distress.  Eyes: Conjunctivae are normal.  Neck: Neck supple. No JVD present. No thyromegaly present.  Cardiovascular: Normal rate, regular rhythm and intact distal pulses.   Respiratory: Effort normal and breath sounds normal. No respiratory distress. He has no wheezes.  Has trach  GI: Soft. Bowel sounds are normal. He exhibits no distension. There is no tenderness.  Has peg tube  Musculoskeletal: He exhibits no edema.  Able to move all extremities  Has left side weakness   Lymphadenopathy:    He has no cervical adenopathy.  Neurological: He is alert.  Skin: Skin is warm and dry. He is not diaphoretic.  Psychiatric: He has a normal mood and affect.       ASSESSMENT/ PLAN:  1. Apical mural thrombus 2. Intraparenchymal hematoma of brain secondary trauma with a resultant cva:   Will continue coumadin 5 mg every evening and will check inr in one week.     Synthia Innocent NP [facility]  Contact [phone] Monday through Friday 8am- 5pm  After hours call [phone]

## 2016-03-24 ENCOUNTER — Encounter (HOSPITAL_COMMUNITY): Payer: Self-pay | Admitting: Emergency Medicine

## 2016-03-24 ENCOUNTER — Emergency Department (HOSPITAL_COMMUNITY)
Admission: EM | Admit: 2016-03-24 | Discharge: 2016-03-24 | Disposition: A | Discharge status: Home / Self Care | Payer: Medicare Other | Attending: Emergency Medicine | Admitting: Emergency Medicine

## 2016-03-24 DIAGNOSIS — Z794 Long term (current) use of insulin: Secondary | ICD-10-CM | POA: Insufficient documentation

## 2016-03-24 DIAGNOSIS — F329 Major depressive disorder, single episode, unspecified: Secondary | ICD-10-CM | POA: Diagnosis not present

## 2016-03-24 DIAGNOSIS — I509 Heart failure, unspecified: Secondary | ICD-10-CM | POA: Insufficient documentation

## 2016-03-24 DIAGNOSIS — Z7901 Long term (current) use of anticoagulants: Secondary | ICD-10-CM | POA: Insufficient documentation

## 2016-03-24 DIAGNOSIS — Z430 Encounter for attention to tracheostomy: Secondary | ICD-10-CM | POA: Diagnosis not present

## 2016-03-24 DIAGNOSIS — I110 Hypertensive heart disease with heart failure: Secondary | ICD-10-CM | POA: Insufficient documentation

## 2016-03-24 DIAGNOSIS — Z79899 Other long term (current) drug therapy: Secondary | ICD-10-CM | POA: Insufficient documentation

## 2016-03-24 DIAGNOSIS — J9503 Malfunction of tracheostomy stoma: Secondary | ICD-10-CM | POA: Diagnosis not present

## 2016-03-24 DIAGNOSIS — J9500 Unspecified tracheostomy complication: Secondary | ICD-10-CM

## 2016-03-24 DIAGNOSIS — Z87891 Personal history of nicotine dependence: Secondary | ICD-10-CM | POA: Insufficient documentation

## 2016-03-24 NOTE — Procedures (Signed)
Tracheostomy Change Note  Patient Details:   Name: Curtis Keller DOB: [birth year] MRN: [id number]    Airway Documentation:     Evaluation  O2 sats: stable throughout Complications: No apparent complications Patient did tolerate procedure well. Bilateral Breath Sounds: Clear, Diminished   Pt. arrived [facility] @ 0533 c/c dislodged Trach, remained on 15 lpm NRB mask on face with sats remaining mid to lowers 90's, RT obtained another 6.0 shiley-Cuffed Trach from last charted placement on 02/22/2016 along with all supplies needed for replacement, after another RT arrived bedside attempted replacing Trach with Dr. Preston Fleeting arriving and being able to replace airway, EtC02(+) for placement, no complications, secured per policy, b/l b.s. noted with small amount blood from site, placed 35% ATC on pt., Obturator placed @ H.O.B., RT to monitor.  Joylene John 03/24/2016, 6:23 AM

## 2016-03-24 NOTE — Discharge Instructions (Signed)
Tracheostomy Tube Safety °A tracheostomy tube (commonly known as trach tube) allows a person to breath without using his or her nose or mouth. A trach tube may be needed if: °· A person's airway is blocked by swelling, injury, tumor, foreign body, vocal cord problem, or severe narrowing of the trachea. °· A person needs long-term ventilation. °· A person has excess airway secretions requiring frequent suctioning. °If you have a trach, you must follow certain safety measures to keep yourself safe and free of infection. °SAFETY MEASURES °· Always carry your emergency travel kit with you when you leave the house. Your bag should include: °¨ A portable suction machine. °¨ Suction catheters. °¨ A mucus trap. °¨ A bulb syringe. °¨ Two trach tubes (one the same size and one smaller). °¨ Trach ties. °¨ Heat and moisture exchanger. °¨ Emergency phone numbers. °¨ Sterile water. °¨ 0.9% saline solution. °¨ Sterile gloves or hand sanitizer. °¨ Sterile gauze pads. °· Clean your stoma site as directed to prevent infection. °· Secure the trach tube exactly as directed to keep the tube from moving out of place. °· Suction the trach tube as often as directed and exactly as directed. °· Avoid dust, mold, tobacco smoke, other types of smoke, and fumes from cleaning solutions, such as ammonia or bleach. °· Cover your trach tube when using any kind of spray product or powder. It is important that you do not inhale the mist or powder. °· Do not sterilize plastic tubes or attempt to clean them in boiling water. They are to be used only once. °· Do not store replacement plastic trach tubes in a location in which the temperature is over 118° F (48° C). °· If you have a cuffed trach tube, do not over inflate the cuff. This can injure your trachea. It may also cause the cuff to extend past the end of the tube where it can restrict or block air flow. °· If you cannot remove your trach tube or the smaller tube that fits inside the trach tube  (inner cannula), do not force it. Call your caregiver. °· Use a humidifier at home to keep some moisture in the air and to prevent your airway and lungs from drying out. Clean the humidifier regularly to prevent buildup of mold and mildew. °· Do not put anything in your trach tube that should not be there. °· Keep your stoma and trach tube dry when you bathe or shower. Direct the shower spray at chest level and place a shower shield or protective covering over your trach tube. °· Keep clothing away from the trach tube except for a protective scarf. Clothing may block the trach tube. Avoid crew necks and turtlenecks. Wear v-neck shirts and open collar shirts or blouses. Do not wear clothes that shed fibers or lint. °· If going outside in very cold air, wear a filter to prevent cold air from entering the trach tube. You can also loosely cover the trach tube with a protective scarf, handkerchief, or gauze. This helps to warm the air as you breathe, so that the cold air does not irritate your trachea and lungs. It also helps to keep out dust or dirt on windy days. °· Sickness: °¨ Suction more frequently is you become sick. °¨ Drink enough fluids to keep your urine clear or pale yellow if you have a fever, vomiting, or diarrhea. °¨ If you vomit, cover your trach tube with a towel to keep vomit out of your airway. If you   think vomit may have entered the trach tube, suction right away.  Post CPR instructions and emergency numbers where they can be seen in an emergency. All of your caregivers must know CPR.  If you use a ventilator:  Routinely check the ventilator safety and sound alarms to be sure they work properly.  Be sure the ventilator tubes are properly placed so that they do not pull on the trach tube.  Do not twist or pull on the trach connector more than needed. This may cause discomfort or disconnect the ventilator tubes.  Hold the trach tube in place when hooking up or disconnecting the ventilator or  humidification tubing.  Always use an inner cannula without side openings (non-fenestrated) with the correct connector if the trach tube has one or more side openings (fenestrated trach tube).  Obtain ongoing support as needed to adjust to living with trach tube. SEEK IMMEDIATE MEDICAL CARE IF:   You have difficulty breathing even after suctioning and cleaning.  You have swelling, redness, warmth, drainage, or tenderness around the stoma.  You have a fever or persistent symptoms for more than 2 to 3 days.  You have a fever and your symptoms suddenly get worse.  You have chills or muscle aches.  You have nausea and vomiting.  You feel dizzy or feel faint.  You have difficulty swallowing.  You have unusual sounds coming from the airway or continue to cough after suctioning.  You have chest pain or have difficulty breathing (despite a clean and properly placed tube).  You have bleeding from the stoma.  You have bright red blood in your mucus.  Your tube becomes plugged and you cannot clear it.  Your tube falls out and cannot be reinserted.   This information is not intended to replace advice given to you by your health care provider. Make sure you discuss any questions you have with your health care provider.   Document Released: 07/04/2012 Document Reviewed: 07/04/2012 Elsevier Interactive Patient Education [employer].

## 2016-03-24 NOTE — ED Provider Notes (Signed)
CSN: [id number]     Arrival date & time 03/24/16  4098 History   First MD Initiated Contact with Patient 03/24/16 0555     Chief Complaint  Patient presents with  . Tracheostomy Tube Change     (Consider location/radiation/quality/duration/timing/severity/associated sxs/prior Treatment) The history is limited by the condition of the patient (Dementia).  60 year old male with permanent tracheostomy apparently pulled the tracheostomy tube out and staff where he is living was unable to replace it. Oxygen saturation dropped so he was sent to the ED.  Past Medical History  Diagnosis Date  . Hypertension   . Depression   . CHF (congestive heart failure) (HCC)   . Acute respiratory failure (HCC)   . Status post tracheostomy (HCC)   . Cardiomyopathy (HCC)   . Apical mural thrombus (HCC)   . Protein calorie malnutrition [facility])    Past Surgical History  Procedure Laterality Date  . Tracheostomy tube placement N/A 02/22/2016    Procedure: TRACHEOSTOMY;  Surgeon: [name], MD;  Location: [facility] OR;  Service: ENT;  Laterality: N/A;   Family History  Problem Relation Age of Onset  . Alcohol abuse Mother   . Alcohol abuse Father   . Alcohol abuse Maternal Uncle    Social History  Substance Use Topics  . Smoking status: Former Games developer  . Smokeless tobacco: None  . Alcohol Use: None    Review of Systems  Unable to perform ROS: Dementia      Allergies  Codeine; Hydrocodone; Hydrocodone-acetaminophen; Lisinopril; and Tylenol  Home Medications   Prior to Admission medications   Medication Sig Start Date End Date Taking? Authorizing Provider  Amino Acids-Protein Hydrolys (FEEDING SUPPLEMENT, PRO-STAT SUGAR FREE 64,) LIQD Take 30 mLs by mouth daily.    Historical Provider, MD  amitriptyline (ELAVIL) 50 MG tablet Take 50 mg by mouth at bedtime. @@ 2200    Historical Provider, MD  atorvastatin (LIPITOR) 40 MG tablet Take 40 mg by mouth at bedtime.    Historical Provider, MD   famotidine (PEPCID) 20 MG tablet Take 20 mg by mouth 2 (two) times daily. 10,22    Historical Provider, MD  FLUoxetine (PROZAC) 40 MG capsule Take 40 mg by mouth daily.    Historical Provider, MD  insulin lispro (HUMALOG) 100 UNIT/ML injection once. Use Sliding Scale every 6 hours ; for blood glucose below 70 give 0 un, low dose from 70 to 150 , give 0  Un, from 151 to 200 give 2 un, from 201 to 250 give 4 un, from 251 to 300 give 6 un, from 301 to 350 give 8 un, from 351 to 400 give 10 un, for blood glucose above 400, give 12 un call MD and obtain stat lab verification    Historical Provider, MD  ipratropium-albuterol (DUONEB) 0.5-2.5 (3) MG/3ML SOLN Take 3 mLs by nebulization every 6 (six) hours as needed.    Historical Provider, MD  LORazepam (ATIVAN) 0.5 MG tablet Take 0.5 mg by mouth every 8 (eight) hours as needed for anxiety.    Historical Provider, MD  metoprolol succinate (TOPROL-XL) 25 MG 24 hr tablet Take 25 mg by mouth 2 (two) times daily.    Historical Provider, MD  nicotine (NICODERM CQ - DOSED IN MG/24 HOURS) 14 mg/24hr patch Place 14 mg onto the skin daily.    Historical Provider, MD  Omega-3 Fatty Acids (FISH OIL PO) Take by mouth. Reported on 03/10/2016    Historical Provider, MD  polyethylene glycol (MIRALAX / GLYCOLAX) packet  Take 17 g by mouth 2 (two) times daily.    Historical Provider, MD  pregabalin (LYRICA) 50 MG capsule Take 50 mg by mouth 2 (two) times daily.    Historical Provider, MD  vitamin C (ASCORBIC ACID) 500 MG tablet Take 500 mg by mouth 2 (two) times daily. 10,22    Historical Provider, MD  warfarin (COUMADIN) 5 MG tablet Take 5 mg by mouth at bedtime.    Historical Provider, MD  zinc sulfate 220 (50 Zn) MG capsule Take 220 mg by mouth daily. At 1800    Historical Provider, MD   BP 96/67 mmHg  Pulse 55  Temp(Src) 98.2 F (36.8 C) (Oral)  Resp 19  SpO2 97% Physical Exam  Nursing note and vitals reviewed.  60 year old male, resting comfortably and in no  acute distress. Vital signs are significant for bradycardia. Oxygen saturation is 97%, which is normal. Head is normocephalic and atraumatic. PERRLA, EOMI. Oropharynx is clear. Neck is nontender and supple without adenopathy or JVD. Tracheostomy opening is present. Back is nontender and there is no CVA tenderness. Lungs are clear without rales, wheezes, or rhonchi. Chest is nontender. Heart has regular rate and rhythm without murmur. Abdomen is soft, flat, nontender without masses or hepatosplenomegaly and peristalsis is normoactive. Extremities have no cyanosis or edema, full range of motion is present. Skin is warm and dry without rash. Neurologic: He is awake and alert, cranial nerves are intact, there are no motor or sensory deficits.  ED Course  Procedures (including critical care time) Procedure: Tracheostomy tube placement Indication: Patient removed tube, hypoxic Procedure: #6 Shiley cuffed tracheostomy tube was inserted through the previously established track. There was good airflow through the trach. Tracheostomy was secured in place. Patient tolerated procedure well with no immediate complications.  MDM   Final diagnoses:  Tracheostomy complication, unspecified complication type (HCC)    Dislodged tracheostomy tube which was replaced in the ED. He is sent back to his nursing care facility.    Dione Booze, MD 03/24/16 [phone]

## 2016-03-24 NOTE — ED Notes (Signed)
Pt arrives by [facility] from [facility] post trach removal. Pt apparently does this multiple times per shift, however, they were unable to get the trach back in and O2 sats dropped to 89%. Sats still 89% when EMS arrived, pt placed on NRB at 10L, sats went to 97%. BPs ran low for EMS with systolic in the 90s, HR was in the 40s-50s. Last BP was 95/68.

## 2016-03-24 NOTE — ED Notes (Signed)
Pt states he understands instructions. Home with [facility].

## 2016-03-28 ENCOUNTER — Emergency Department (HOSPITAL_COMMUNITY)
Admission: EM | Admit: 2016-03-28 | Discharge: 2016-03-29 | Disposition: A | Discharge status: Home / Self Care | Payer: Medicare Other | Attending: Emergency Medicine | Admitting: Emergency Medicine

## 2016-03-28 ENCOUNTER — Encounter (HOSPITAL_COMMUNITY): Payer: Self-pay

## 2016-03-28 DIAGNOSIS — Z7901 Long term (current) use of anticoagulants: Secondary | ICD-10-CM | POA: Diagnosis not present

## 2016-03-28 DIAGNOSIS — F329 Major depressive disorder, single episode, unspecified: Secondary | ICD-10-CM | POA: Diagnosis not present

## 2016-03-28 DIAGNOSIS — Z87891 Personal history of nicotine dependence: Secondary | ICD-10-CM | POA: Insufficient documentation

## 2016-03-28 DIAGNOSIS — J9503 Malfunction of tracheostomy stoma: Secondary | ICD-10-CM | POA: Insufficient documentation

## 2016-03-28 DIAGNOSIS — Z430 Encounter for attention to tracheostomy: Secondary | ICD-10-CM

## 2016-03-28 DIAGNOSIS — I509 Heart failure, unspecified: Secondary | ICD-10-CM | POA: Diagnosis not present

## 2016-03-28 DIAGNOSIS — Z794 Long term (current) use of insulin: Secondary | ICD-10-CM | POA: Diagnosis not present

## 2016-03-28 DIAGNOSIS — I110 Hypertensive heart disease with heart failure: Secondary | ICD-10-CM | POA: Insufficient documentation

## 2016-03-28 NOTE — ED Notes (Signed)
Bed: RESA Expected date:  Expected time:  Means of arrival:  Comments: EMS patient took out trach

## 2016-03-28 NOTE — ED Notes (Signed)
Per EMS- pt from [facility] pulled out trach several times today. Pt is refusing to have trach put back in. Pt has 02 sat of 94% on room air.

## 2016-03-28 NOTE — Progress Notes (Signed)
Pt states he has pulled his trach out 5 times today and does not want it in.  Discussed with ED MD and charge RN, and per ED MD leave trach out and place Pt on humidified trach collar.  RT will continue to monitor the Pt closely in case the need arises to place trach back in.

## 2016-03-29 DIAGNOSIS — E1122 Type 2 diabetes mellitus with diabetic chronic kidney disease: Secondary | ICD-10-CM | POA: Diagnosis not present

## 2016-03-29 DIAGNOSIS — Z430 Encounter for attention to tracheostomy: Secondary | ICD-10-CM | POA: Diagnosis not present

## 2016-03-29 DIAGNOSIS — T8579XA Infection and inflammatory reaction due to other internal prosthetic devices, implants and grafts, initial encounter: Secondary | ICD-10-CM | POA: Diagnosis not present

## 2016-03-29 DIAGNOSIS — Z8673 Personal history of transient ischemic attack (TIA), and cerebral infarction without residual deficits: Secondary | ICD-10-CM | POA: Diagnosis not present

## 2016-03-29 DIAGNOSIS — Z7982 Long term (current) use of aspirin: Secondary | ICD-10-CM | POA: Diagnosis not present

## 2016-03-29 DIAGNOSIS — I429 Cardiomyopathy, unspecified: Secondary | ICD-10-CM | POA: Diagnosis not present

## 2016-03-29 DIAGNOSIS — R1310 Dysphagia, unspecified: Secondary | ICD-10-CM | POA: Diagnosis not present

## 2016-03-29 DIAGNOSIS — J449 Chronic obstructive pulmonary disease, unspecified: Secondary | ICD-10-CM | POA: Diagnosis not present

## 2016-03-29 DIAGNOSIS — I110 Hypertensive heart disease with heart failure: Secondary | ICD-10-CM | POA: Diagnosis not present

## 2016-03-29 DIAGNOSIS — S3600XA Unspecified injury of spleen, initial encounter: Secondary | ICD-10-CM | POA: Diagnosis not present

## 2016-03-29 DIAGNOSIS — F329 Major depressive disorder, single episode, unspecified: Secondary | ICD-10-CM | POA: Diagnosis not present

## 2016-03-29 DIAGNOSIS — I509 Heart failure, unspecified: Secondary | ICD-10-CM | POA: Diagnosis not present

## 2016-03-29 DIAGNOSIS — J9690 Respiratory failure, unspecified, unspecified whether with hypoxia or hypercapnia: Secondary | ICD-10-CM | POA: Diagnosis not present

## 2016-03-29 DIAGNOSIS — I6930 Unspecified sequelae of cerebral infarction: Secondary | ICD-10-CM | POA: Diagnosis not present

## 2016-03-29 DIAGNOSIS — J9503 Malfunction of tracheostomy stoma: Secondary | ICD-10-CM | POA: Diagnosis not present

## 2016-03-29 DIAGNOSIS — J188 Other pneumonia, unspecified organism: Secondary | ICD-10-CM | POA: Diagnosis not present

## 2016-03-29 DIAGNOSIS — I2510 Atherosclerotic heart disease of native coronary artery without angina pectoris: Secondary | ICD-10-CM | POA: Diagnosis not present

## 2016-03-29 DIAGNOSIS — J45909 Unspecified asthma, uncomplicated: Secondary | ICD-10-CM | POA: Diagnosis not present

## 2016-03-29 DIAGNOSIS — Z7901 Long term (current) use of anticoagulants: Secondary | ICD-10-CM | POA: Diagnosis not present

## 2016-03-29 DIAGNOSIS — M25551 Pain in right hip: Secondary | ICD-10-CM | POA: Diagnosis not present

## 2016-03-29 DIAGNOSIS — J9509 Other tracheostomy complication: Secondary | ICD-10-CM | POA: Diagnosis not present

## 2016-03-29 DIAGNOSIS — Z79899 Other long term (current) drug therapy: Secondary | ICD-10-CM | POA: Diagnosis not present

## 2016-03-29 DIAGNOSIS — L02211 Cutaneous abscess of abdominal wall: Secondary | ICD-10-CM | POA: Diagnosis not present

## 2016-03-29 DIAGNOSIS — T83498A Other mechanical complication of other prosthetic devices, implants and grafts of genital tract, initial encounter: Secondary | ICD-10-CM | POA: Diagnosis not present

## 2016-03-29 DIAGNOSIS — Z794 Long term (current) use of insulin: Secondary | ICD-10-CM | POA: Diagnosis not present

## 2016-03-29 DIAGNOSIS — I10 Essential (primary) hypertension: Secondary | ICD-10-CM | POA: Diagnosis not present

## 2016-03-29 DIAGNOSIS — T85628A Displacement of other specified internal prosthetic devices, implants and grafts, initial encounter: Secondary | ICD-10-CM | POA: Diagnosis not present

## 2016-03-29 DIAGNOSIS — R220 Localized swelling, mass and lump, head: Secondary | ICD-10-CM | POA: Diagnosis not present

## 2016-03-29 DIAGNOSIS — Y828 Other medical devices associated with adverse incidents: Secondary | ICD-10-CM | POA: Diagnosis not present

## 2016-03-29 DIAGNOSIS — D649 Anemia, unspecified: Secondary | ICD-10-CM | POA: Diagnosis not present

## 2016-03-29 DIAGNOSIS — J9500 Unspecified tracheostomy complication: Secondary | ICD-10-CM | POA: Diagnosis not present

## 2016-03-29 DIAGNOSIS — J95850 Mechanical complication of respirator: Secondary | ICD-10-CM | POA: Diagnosis not present

## 2016-03-29 DIAGNOSIS — B1920 Unspecified viral hepatitis C without hepatic coma: Secondary | ICD-10-CM | POA: Diagnosis not present

## 2016-03-29 DIAGNOSIS — Z87891 Personal history of nicotine dependence: Secondary | ICD-10-CM | POA: Diagnosis not present

## 2016-03-29 DIAGNOSIS — Z930 Tracheostomy status: Secondary | ICD-10-CM | POA: Diagnosis not present

## 2016-03-29 DIAGNOSIS — M160 Bilateral primary osteoarthritis of hip: Secondary | ICD-10-CM | POA: Diagnosis not present

## 2016-03-29 DIAGNOSIS — T8131XA Disruption of external operation (surgical) wound, not elsewhere classified, initial encounter: Secondary | ICD-10-CM | POA: Diagnosis not present

## 2016-03-29 DIAGNOSIS — I69391 Dysphagia following cerebral infarction: Secondary | ICD-10-CM | POA: Diagnosis not present

## 2016-03-29 DIAGNOSIS — R0602 Shortness of breath: Secondary | ICD-10-CM | POA: Diagnosis not present

## 2016-03-29 DIAGNOSIS — S36039S Unspecified laceration of spleen, sequela: Secondary | ICD-10-CM | POA: Diagnosis not present

## 2016-03-29 DIAGNOSIS — T859XXA Unspecified complication of internal prosthetic device, implant and graft, initial encounter: Secondary | ICD-10-CM | POA: Diagnosis not present

## 2016-03-29 DIAGNOSIS — G8194 Hemiplegia, unspecified affecting left nondominant side: Secondary | ICD-10-CM | POA: Diagnosis not present

## 2016-03-29 DIAGNOSIS — E785 Hyperlipidemia, unspecified: Secondary | ICD-10-CM | POA: Diagnosis not present

## 2016-03-29 NOTE — ED Notes (Signed)
Respiratory placed a 85mm cuffless trach

## 2016-03-29 NOTE — Discharge Instructions (Signed)
Tracheostomy Tube Safety °A tracheostomy tube (commonly known as trach tube) allows a person to breath without using his or her nose or mouth. A trach tube may be needed if: °· A person's airway is blocked by swelling, injury, tumor, foreign body, vocal cord problem, or severe narrowing of the trachea. °· A person needs long-term ventilation. °· A person has excess airway secretions requiring frequent suctioning. °If you have a trach, you must follow certain safety measures to keep yourself safe and free of infection. °SAFETY MEASURES °· Always carry your emergency travel kit with you when you leave the house. Your bag should include: °¨ A portable suction machine. °¨ Suction catheters. °¨ A mucus trap. °¨ A bulb syringe. °¨ Two trach tubes (one the same size and one smaller). °¨ Trach ties. °¨ Heat and moisture exchanger. °¨ Emergency phone numbers. °¨ Sterile water. °¨ 0.9% saline solution. °¨ Sterile gloves or hand sanitizer. °¨ Sterile gauze pads. °· Clean your stoma site as directed to prevent infection. °· Secure the trach tube exactly as directed to keep the tube from moving out of place. °· Suction the trach tube as often as directed and exactly as directed. °· Avoid dust, mold, tobacco smoke, other types of smoke, and fumes from cleaning solutions, such as ammonia or bleach. °· Cover your trach tube when using any kind of spray product or powder. It is important that you do not inhale the mist or powder. °· Do not sterilize plastic tubes or attempt to clean them in boiling water. They are to be used only once. °· Do not store replacement plastic trach tubes in a location in which the temperature is over 118° F (48° C). °· If you have a cuffed trach tube, do not over inflate the cuff. This can injure your trachea. It may also cause the cuff to extend past the end of the tube where it can restrict or block air flow. °· If you cannot remove your trach tube or the smaller tube that fits inside the trach tube  (inner cannula), do not force it. Call your caregiver. °· Use a humidifier at home to keep some moisture in the air and to prevent your airway and lungs from drying out. Clean the humidifier regularly to prevent buildup of mold and mildew. °· Do not put anything in your trach tube that should not be there. °· Keep your stoma and trach tube dry when you bathe or shower. Direct the shower spray at chest level and place a shower shield or protective covering over your trach tube. °· Keep clothing away from the trach tube except for a protective scarf. Clothing may block the trach tube. Avoid crew necks and turtlenecks. Wear v-neck shirts and open collar shirts or blouses. Do not wear clothes that shed fibers or lint. °· If going outside in very cold air, wear a filter to prevent cold air from entering the trach tube. You can also loosely cover the trach tube with a protective scarf, handkerchief, or gauze. This helps to warm the air as you breathe, so that the cold air does not irritate your trachea and lungs. It also helps to keep out dust or dirt on windy days. °· Sickness: °¨ Suction more frequently is you become sick. °¨ Drink enough fluids to keep your urine clear or pale yellow if you have a fever, vomiting, or diarrhea. °¨ If you vomit, cover your trach tube with a towel to keep vomit out of your airway. If you   think vomit may have entered the trach tube, suction right away.  Post CPR instructions and emergency numbers where they can be seen in an emergency. All of your caregivers must know CPR.  If you use a ventilator:  Routinely check the ventilator safety and sound alarms to be sure they work properly.  Be sure the ventilator tubes are properly placed so that they do not pull on the trach tube.  Do not twist or pull on the trach connector more than needed. This may cause discomfort or disconnect the ventilator tubes.  Hold the trach tube in place when hooking up or disconnecting the ventilator or  humidification tubing.  Always use an inner cannula without side openings (non-fenestrated) with the correct connector if the trach tube has one or more side openings (fenestrated trach tube).  Obtain ongoing support as needed to adjust to living with trach tube. SEEK IMMEDIATE MEDICAL CARE IF:   You have difficulty breathing even after suctioning and cleaning.  You have swelling, redness, warmth, drainage, or tenderness around the stoma.  You have a fever or persistent symptoms for more than 2 to 3 days.  You have a fever and your symptoms suddenly get worse.  You have chills or muscle aches.  You have nausea and vomiting.  You feel dizzy or feel faint.  You have difficulty swallowing.  You have unusual sounds coming from the airway or continue to cough after suctioning.  You have chest pain or have difficulty breathing (despite a clean and properly placed tube).  You have bleeding from the stoma.  You have bright red blood in your mucus.  Your tube becomes plugged and you cannot clear it.  Your tube falls out and cannot be reinserted.   This information is not intended to replace advice given to you by your health care provider. Make sure you discuss any questions you have with your health care provider.   Document Released: 07/04/2012 Document Reviewed: 07/04/2012 Elsevier Interactive Patient Education [employer].

## 2016-03-29 NOTE — Procedures (Signed)
Tracheostomy Change Note  Patient Details:   Name: Curtis Keller DOB: 26-Sep-1956 MRN: [id number]    Airway Documentation:     Evaluation  O2 sats: stable throughout Complications: No apparent complications Patient did tolerate procedure well. Bilateral Breath Sounds: Clear, Diminished  4.0 Shiley Placed.  Rulon Eisenmenger 03/29/2016, 1:30 AM

## 2016-03-29 NOTE — ED Provider Notes (Signed)
CSN: [id number]     Arrival date & time 03/28/16  2247 History   First MD Initiated Contact with Patient 03/29/16 0047     Chief Complaint  Patient presents with  . Pulled trach      (Consider location/radiation/quality/duration/timing/severity/associated sxs/prior Treatment) HPI Comments: Patient presents with complaint of dislodged tracheostomy tube. Initial reports state that he removes his tubing multiple times during the day and per nursing home report, the patient was refusing to have it replaced prompting visit to the emergency department. The patient arrives in NAD, stating he will allow tube replacement. No other complaints.   The history is provided by the patient and the nursing home. No language interpreter was used.    Past Medical History  Diagnosis Date  . Hypertension   . Depression   . CHF (congestive heart failure) (HCC)   . Acute respiratory failure (HCC)   . Status post tracheostomy (HCC)   . Cardiomyopathy (HCC)   . Apical mural thrombus (HCC)   . Protein calorie malnutrition [facility])    Past Surgical History  Procedure Laterality Date  . Tracheostomy tube placement N/A 02/22/2016    Procedure: TRACHEOSTOMY;  Surgeon: [name], MD;  Location: [facility] OR;  Service: ENT;  Laterality: N/A;   Family History  Problem Relation Age of Onset  . Alcohol abuse Mother   . Alcohol abuse Father   . Alcohol abuse Maternal Uncle    Social History  Substance Use Topics  . Smoking status: Former Games developer  . Smokeless tobacco: None  . Alcohol Use: None    Review of Systems  Unable to perform ROS: Dementia      Allergies  Codeine; Hydrocodone; Hydrocodone-acetaminophen; Lisinopril; and Tylenol  Home Medications   Prior to Admission medications   Medication Sig Start Date End Date Taking? Authorizing Provider  Amino Acids-Protein Hydrolys (FEEDING SUPPLEMENT, PRO-STAT SUGAR FREE 64,) LIQD Take 30 mLs by mouth daily.   Yes Historical Provider, MD  atorvastatin  (LIPITOR) 40 MG tablet Take 40 mg by mouth at bedtime.   Yes Historical Provider, MD  famotidine (PEPCID) 20 MG tablet Take 20 mg by mouth 2 (two) times daily. 10,22   Yes Historical Provider, MD  FLUoxetine (PROZAC) 40 MG capsule Take 40 mg by mouth daily.   Yes Historical Provider, MD  insulin lispro (HUMALOG) 100 UNIT/ML injection once. Use Sliding Scale every 6 hours ; for blood glucose below 70 give 0 un, low dose from 70 to 150 , give 0  Un, from 151 to 200 give 2 un, from 201 to 250 give 4 un, from 251 to 300 give 6 un, from 301 to 350 give 8 un, from 351 to 400 give 10 un, for blood glucose above 400, give 12 un call MD and obtain stat lab verification   Yes Historical Provider, MD  ipratropium-albuterol (DUONEB) 0.5-2.5 (3) MG/3ML SOLN Take 3 mLs by nebulization every 6 (six) hours as needed.   Yes Historical Provider, MD  LORazepam (ATIVAN) 0.5 MG tablet Take 0.5 mg by mouth every 8 (eight) hours as needed for anxiety.   Yes Historical Provider, MD  metoprolol succinate (TOPROL-XL) 25 MG 24 hr tablet Take 25 mg by mouth 2 (two) times daily.   Yes Historical Provider, MD  nicotine (NICODERM CQ - DOSED IN MG/24 HOURS) 14 mg/24hr patch Place 14 mg onto the skin daily.   Yes Historical Provider, MD  Omega-3 Fatty Acids (FISH OIL PO) Take by mouth. Reported on 03/10/2016  Yes Historical Provider, MD  polyethylene glycol (MIRALAX / GLYCOLAX) packet Take 17 g by mouth 2 (two) times daily.   Yes Historical Provider, MD  pregabalin (LYRICA) 50 MG capsule Take 50 mg by mouth 2 (two) times daily.   Yes Historical Provider, MD  vitamin C (ASCORBIC ACID) 500 MG tablet Take 500 mg by mouth 2 (two) times daily. 10,22   Yes Historical Provider, MD  warfarin (COUMADIN) 5 MG tablet Take 5 mg by mouth at bedtime.   Yes Historical Provider, MD  zinc sulfate 220 (50 Zn) MG capsule Take 220 mg by mouth daily. At 1800   Yes Historical Provider, MD   BP 127/94 mmHg  Pulse 67  Temp(Src) 98.5 F (36.9 C) (Oral)   Resp 20  Ht  (1.88 m)  Wt 113.399 kg  BMI 32.08 kg/m2  SpO2 98% Physical Exam  Constitutional: He is oriented to person, place, and time. He appears well-developed and well-nourished. No distress.  HENT:  Tracheostomy stoma open without purulence, redness or bleeding.   Neck: Normal range of motion. Neck supple.  Cardiovascular: Normal rate.   No murmur heard. Pulmonary/Chest: Effort normal. He has no wheezes. He has no rales.  Abdominal: Soft.  Non-tender abdomen with feeding in place.   Neurological: He is alert and oriented to person, place, and time. Coordination normal.  Skin: Skin is warm and dry.    ED Course  Procedures (including critical care time) Labs Review Labs Reviewed - No data to display  Imaging Review No results found. I have personally reviewed and evaluated these images and lab results as part of my medical decision-making.   EKG Interpretation None      MDM   Final diagnoses:  None    1. Tracheostomy tube placement  The patient remains in NAD, maintaining O2 saturations above 92%. He allows respiratory to replace trach tube and it inserted without difficulty. He can be discharged back to [facility].     Elpidio Anis, PA-C 03/31/16 1610  Gerhard Munch, MD 04/01/16 [phone]

## 2016-03-30 ENCOUNTER — Encounter (HOSPITAL_COMMUNITY): Payer: Self-pay | Admitting: Emergency Medicine

## 2016-03-30 ENCOUNTER — Emergency Department (HOSPITAL_COMMUNITY)
Admission: EM | Admit: 2016-03-30 | Discharge: 2016-03-30 | Disposition: A | Discharge status: Home / Self Care | Payer: Medicare Other | Attending: Emergency Medicine | Admitting: Emergency Medicine

## 2016-03-30 DIAGNOSIS — F329 Major depressive disorder, single episode, unspecified: Secondary | ICD-10-CM | POA: Diagnosis not present

## 2016-03-30 DIAGNOSIS — I110 Hypertensive heart disease with heart failure: Secondary | ICD-10-CM | POA: Insufficient documentation

## 2016-03-30 DIAGNOSIS — Z7901 Long term (current) use of anticoagulants: Secondary | ICD-10-CM | POA: Diagnosis not present

## 2016-03-30 DIAGNOSIS — I509 Heart failure, unspecified: Secondary | ICD-10-CM | POA: Insufficient documentation

## 2016-03-30 DIAGNOSIS — Z430 Encounter for attention to tracheostomy: Secondary | ICD-10-CM | POA: Diagnosis not present

## 2016-03-30 DIAGNOSIS — Z87891 Personal history of nicotine dependence: Secondary | ICD-10-CM | POA: Diagnosis not present

## 2016-03-30 DIAGNOSIS — J9500 Unspecified tracheostomy complication: Secondary | ICD-10-CM

## 2016-03-30 DIAGNOSIS — J9509 Other tracheostomy complication: Secondary | ICD-10-CM | POA: Insufficient documentation

## 2016-03-30 NOTE — ED Notes (Signed)
[facility] at bedside 

## 2016-03-30 NOTE — ED Notes (Signed)
[facility] notified of transportation need 

## 2016-03-30 NOTE — ED Provider Notes (Signed)
CSN: [id number]     Arrival date & time 03/30/16  4888 History   First MD Initiated Contact with Patient 03/30/16 [phone]     Chief Complaint  Patient presents with  . Tracheostomy Tube Change   Level V caveat secondary to dementia  HPI  Mr. Curtis Keller is a 60 year old male past medical history of dementia presenting after pulling his trach out. Pt seen twice in the past week for the same. Pt presents from [facility] who report he pulled the tube and they were unable to replace it. His oxygen dropped to 91% and he was sent to ED for respiratory to replace it.   Past Medical History  Diagnosis Date  . Hypertension   . Depression   . CHF (congestive heart failure) (HCC)   . Acute respiratory failure (HCC)   . Status post tracheostomy (HCC)   . Cardiomyopathy (HCC)   . Apical mural thrombus (HCC)   . Protein calorie malnutrition [facility])    Past Surgical History  Procedure Laterality Date  . Tracheostomy tube placement N/A 02/22/2016    Procedure: TRACHEOSTOMY;  Surgeon: [name], MD;  Location: [facility] OR;  Service: ENT;  Laterality: N/A;   Family History  Problem Relation Age of Onset  . Alcohol abuse Mother   . Alcohol abuse Father   . Alcohol abuse Maternal Uncle    Social History  Substance Use Topics  . Smoking status: Former Games developer  . Smokeless tobacco: None  . Alcohol Use: None    Review of Systems  Unable to perform ROS: Dementia      Allergies  Codeine; Hydrocodone; Hydrocodone-acetaminophen; Lisinopril; and Tylenol  Home Medications   Prior to Admission medications   Medication Sig Start Date End Date Taking? Authorizing Provider  Amino Acids-Protein Hydrolys (FEEDING SUPPLEMENT, PRO-STAT SUGAR FREE 64,) LIQD Take 30 mLs by mouth daily.    Historical Provider, MD  atorvastatin (LIPITOR) 40 MG tablet Take 40 mg by mouth at bedtime.    Historical Provider, MD  famotidine (PEPCID) 20 MG tablet Take 20 mg by mouth 2 (two) times daily. 10,22    Historical Provider,  MD  FLUoxetine (PROZAC) 40 MG capsule Take 40 mg by mouth daily.    Historical Provider, MD  insulin lispro (HUMALOG) 100 UNIT/ML injection once. Use Sliding Scale every 6 hours ; for blood glucose below 70 give 0 un, low dose from 70 to 150 , give 0  Un, from 151 to 200 give 2 un, from 201 to 250 give 4 un, from 251 to 300 give 6 un, from 301 to 350 give 8 un, from 351 to 400 give 10 un, for blood glucose above 400, give 12 un call MD and obtain stat lab verification    Historical Provider, MD  ipratropium-albuterol (DUONEB) 0.5-2.5 (3) MG/3ML SOLN Take 3 mLs by nebulization every 6 (six) hours as needed.    Historical Provider, MD  LORazepam (ATIVAN) 0.5 MG tablet Take 0.5 mg by mouth every 8 (eight) hours as needed for anxiety.    Historical Provider, MD  metoprolol succinate (TOPROL-XL) 25 MG 24 hr tablet Take 25 mg by mouth 2 (two) times daily.    Historical Provider, MD  nicotine (NICODERM CQ - DOSED IN MG/24 HOURS) 14 mg/24hr patch Place 14 mg onto the skin daily.    Historical Provider, MD  Omega-3 Fatty Acids (FISH OIL PO) Take by mouth. Reported on 03/10/2016    Historical Provider, MD  polyethylene glycol (MIRALAX / GLYCOLAX) packet Take  17 g by mouth 2 (two) times daily.    Historical Provider, MD  pregabalin (LYRICA) 50 MG capsule Take 50 mg by mouth 2 (two) times daily.    Historical Provider, MD  vitamin C (ASCORBIC ACID) 500 MG tablet Take 500 mg by mouth 2 (two) times daily. 10,22    Historical Provider, MD  warfarin (COUMADIN) 5 MG tablet Take 5 mg by mouth at bedtime.    Historical Provider, MD  zinc sulfate 220 (50 Zn) MG capsule Take 220 mg by mouth daily. At 1800    Historical Provider, MD   BP 113/72 mmHg  Pulse 63  Temp(Src) 97.6 F (36.4 C) (Oral)  Resp 16  SpO2 92% Physical Exam  Constitutional: He appears well-developed and well-nourished. No distress.  HENT:  Head: Normocephalic and atraumatic.  Eyes: Conjunctivae are normal. Right eye exhibits no discharge. Left  eye exhibits no discharge. No scleral icterus.  Neck: Normal range of motion.  Tracheostomy site present. No erythema or drainage. Neck is non-tender.   Cardiovascular: Normal rate, regular rhythm and normal heart sounds.   Pulmonary/Chest: Effort normal and breath sounds normal. No respiratory distress.  Musculoskeletal: Normal range of motion.  Neurological: He is alert. Coordination normal.  Skin: Skin is warm and dry.  Psychiatric: He has a normal mood and affect. His behavior is normal.  Nursing note and vitals reviewed.   ED Course  Procedures (including critical care time) Labs Review Labs Reviewed - No data to display  Imaging Review No results found. I have personally reviewed and evaluated these images and lab results as part of my medical decision-making.   EKG Interpretation None      MDM   Final diagnoses:  Complication of tracheostomy tube [facility])   61 year old male with history of dementia presenting after pulling his trach out. Nursing facility was unable to replace it. Afebrile and in no acute distress. O2 93% on RA. Respiratory replaced trach without complication. Pt remains hemodynamically stable with O2 maintained above 90%. Pt stable to be discharged back to nursing facility.     Alveta Heimlich, PA-C 03/30/16 0736  Paula Libra, MD 03/31/16 2235

## 2016-03-30 NOTE — ED Notes (Signed)
Patient brought in by [facility] from [facility] bc patient removed his tracheostomy. Patient was seen yesterday for same issue. Facility reports patient has been removing it frequently and trying to hide the fact that he has done so by covering his neck with his sheets. Facility was unable to replace the trach without force, so EMS was called. EMS reports patients o2 sat has been between 90 and 94% on RA since their arrival. Patient denies SOB, CP and SI.

## 2016-03-30 NOTE — ED Notes (Signed)
Bed: WA14 Expected date:  Expected time:  Means of arrival:  Comments: EMS 

## 2016-03-30 NOTE — Procedures (Signed)
Tracheostomy Change Note  Patient Details:   Name: Curtis Keller DOB: Jul 08, 1956 MRN: [id number]    Airway Documentation:     Evaluation  O2 sats: stable throughout Complications: No apparent complications Patient did tolerate procedure well. Bilateral Breath Sounds: Clear, Diminished  Red rules followed and time out completed to ensure proper Pt.  A new 4.0 Shiley uncuffed trach was inserted and secured with trach tie.  Pt remained stable throughout the procedure and remains stable on room air following the procedure.  Rulon Eisenmenger 03/30/2016, 6:43 AM

## 2016-03-30 NOTE — ED Notes (Signed)
Discharge instructions and follow up care reviewed with nursing facility. Nursing facility verbalized understanding.

## 2016-03-30 NOTE — Discharge Instructions (Signed)
Tracheostomy Tube Safety °A tracheostomy tube (commonly known as trach tube) allows a person to breath without using his or her nose or mouth. A trach tube may be needed if: °· A person's airway is blocked by swelling, injury, tumor, foreign body, vocal cord problem, or severe narrowing of the trachea. °· A person needs long-term ventilation. °· A person has excess airway secretions requiring frequent suctioning. °If you have a trach, you must follow certain safety measures to keep yourself safe and free of infection. °SAFETY MEASURES °· Always carry your emergency travel kit with you when you leave the house. Your bag should include: °¨ A portable suction machine. °¨ Suction catheters. °¨ A mucus trap. °¨ A bulb syringe. °¨ Two trach tubes (one the same size and one smaller). °¨ Trach ties. °¨ Heat and moisture exchanger. °¨ Emergency phone numbers. °¨ Sterile water. °¨ 0.9% saline solution. °¨ Sterile gloves or hand sanitizer. °¨ Sterile gauze pads. °· Clean your stoma site as directed to prevent infection. °· Secure the trach tube exactly as directed to keep the tube from moving out of place. °· Suction the trach tube as often as directed and exactly as directed. °· Avoid dust, mold, tobacco smoke, other types of smoke, and fumes from cleaning solutions, such as ammonia or bleach. °· Cover your trach tube when using any kind of spray product or powder. It is important that you do not inhale the mist or powder. °· Do not sterilize plastic tubes or attempt to clean them in boiling water. They are to be used only once. °· Do not store replacement plastic trach tubes in a location in which the temperature is over 118° F (48° C). °· If you have a cuffed trach tube, do not over inflate the cuff. This can injure your trachea. It may also cause the cuff to extend past the end of the tube where it can restrict or block air flow. °· If you cannot remove your trach tube or the smaller tube that fits inside the trach tube  (inner cannula), do not force it. Call your caregiver. °· Use a humidifier at home to keep some moisture in the air and to prevent your airway and lungs from drying out. Clean the humidifier regularly to prevent buildup of mold and mildew. °· Do not put anything in your trach tube that should not be there. °· Keep your stoma and trach tube dry when you bathe or shower. Direct the shower spray at chest level and place a shower shield or protective covering over your trach tube. °· Keep clothing away from the trach tube except for a protective scarf. Clothing may block the trach tube. Avoid crew necks and turtlenecks. Wear v-neck shirts and open collar shirts or blouses. Do not wear clothes that shed fibers or lint. °· If going outside in very cold air, wear a filter to prevent cold air from entering the trach tube. You can also loosely cover the trach tube with a protective scarf, handkerchief, or gauze. This helps to warm the air as you breathe, so that the cold air does not irritate your trachea and lungs. It also helps to keep out dust or dirt on windy days. °· Sickness: °¨ Suction more frequently is you become sick. °¨ Drink enough fluids to keep your urine clear or pale yellow if you have a fever, vomiting, or diarrhea. °¨ If you vomit, cover your trach tube with a towel to keep vomit out of your airway. If you   think vomit may have entered the trach tube, suction right away.  Post CPR instructions and emergency numbers where they can be seen in an emergency. All of your caregivers must know CPR.  If you use a ventilator:  Routinely check the ventilator safety and sound alarms to be sure they work properly.  Be sure the ventilator tubes are properly placed so that they do not pull on the trach tube.  Do not twist or pull on the trach connector more than needed. This may cause discomfort or disconnect the ventilator tubes.  Hold the trach tube in place when hooking up or disconnecting the ventilator or  humidification tubing.  Always use an inner cannula without side openings (non-fenestrated) with the correct connector if the trach tube has one or more side openings (fenestrated trach tube).  Obtain ongoing support as needed to adjust to living with trach tube. SEEK IMMEDIATE MEDICAL CARE IF:   You have difficulty breathing even after suctioning and cleaning.  You have swelling, redness, warmth, drainage, or tenderness around the stoma.  You have a fever or persistent symptoms for more than 2 to 3 days.  You have a fever and your symptoms suddenly get worse.  You have chills or muscle aches.  You have nausea and vomiting.  You feel dizzy or feel faint.  You have difficulty swallowing.  You have unusual sounds coming from the airway or continue to cough after suctioning.  You have chest pain or have difficulty breathing (despite a clean and properly placed tube).  You have bleeding from the stoma.  You have bright red blood in your mucus.  Your tube becomes plugged and you cannot clear it.  Your tube falls out and cannot be reinserted.   This information is not intended to replace advice given to you by your health care provider. Make sure you discuss any questions you have with your health care provider.   Document Released: 07/04/2012 Document Reviewed: 07/04/2012 Elsevier Interactive Patient Education [employer].

## 2016-04-01 ENCOUNTER — Emergency Department (HOSPITAL_COMMUNITY)
Admission: EM | Admit: 2016-04-01 | Discharge: 2016-04-01 | Disposition: A | Discharge status: Home / Self Care | Payer: Medicare Other | Attending: Emergency Medicine | Admitting: Emergency Medicine

## 2016-04-01 ENCOUNTER — Emergency Department (HOSPITAL_COMMUNITY)
Admission: EM | Admit: 2016-04-01 | Discharge: 2016-04-01 | Disposition: A | Discharge status: Home / Self Care | Payer: Medicare Other | Source: Home / Self Care | Attending: Emergency Medicine | Admitting: Emergency Medicine

## 2016-04-01 ENCOUNTER — Encounter (HOSPITAL_COMMUNITY): Payer: Self-pay

## 2016-04-01 ENCOUNTER — Encounter (HOSPITAL_COMMUNITY): Payer: Self-pay | Admitting: Emergency Medicine

## 2016-04-01 DIAGNOSIS — I509 Heart failure, unspecified: Secondary | ICD-10-CM | POA: Insufficient documentation

## 2016-04-01 DIAGNOSIS — Z7901 Long term (current) use of anticoagulants: Secondary | ICD-10-CM | POA: Insufficient documentation

## 2016-04-01 DIAGNOSIS — Z87891 Personal history of nicotine dependence: Secondary | ICD-10-CM

## 2016-04-01 DIAGNOSIS — Z430 Encounter for attention to tracheostomy: Secondary | ICD-10-CM

## 2016-04-01 DIAGNOSIS — F329 Major depressive disorder, single episode, unspecified: Secondary | ICD-10-CM

## 2016-04-01 DIAGNOSIS — I110 Hypertensive heart disease with heart failure: Secondary | ICD-10-CM | POA: Insufficient documentation

## 2016-04-01 DIAGNOSIS — T85628A Displacement of other specified internal prosthetic devices, implants and grafts, initial encounter: Secondary | ICD-10-CM | POA: Insufficient documentation

## 2016-04-01 DIAGNOSIS — Y828 Other medical devices associated with adverse incidents: Secondary | ICD-10-CM

## 2016-04-01 DIAGNOSIS — J398 Other specified diseases of upper respiratory tract: Secondary | ICD-10-CM

## 2016-04-01 DIAGNOSIS — Z794 Long term (current) use of insulin: Secondary | ICD-10-CM | POA: Diagnosis not present

## 2016-04-01 DIAGNOSIS — J9500 Unspecified tracheostomy complication: Secondary | ICD-10-CM | POA: Diagnosis not present

## 2016-04-01 DIAGNOSIS — R0602 Shortness of breath: Secondary | ICD-10-CM | POA: Diagnosis not present

## 2016-04-01 NOTE — ED Notes (Signed)
Bed: [facility] Expected date:  Expected time:  Means of arrival:  Comments: 

## 2016-04-01 NOTE — ED Provider Notes (Signed)
CSN: [id number]     Arrival date & time 04/01/16  1715 History   First MD Initiated Contact with Patient 04/01/16 1723     Chief Complaint  Patient presents with  . Tracheostomy Tube Change     (Consider location/radiation/quality/duration/timing/severity/associated sxs/prior Treatment) The history is provided by the patient and medical records.    60 year old male with history of CHF, depression, acute respiratory failure, cardiomyopathy, malnutrition, presenting to the ED for trach dislodgment. Patient was seen here earlier this evening for same, had trach replaced by respiratory and was discharged back to his facility. Patient apparently has a hsitory of doing this-- this is his 5th ER visit in 9 days for the same.  Staff at facility reports patient pulled his trach out shortly after arriving back there. Upon re-arrival to the ED, respiratory notified us that patient is scheduled to have tracheostomy de-cannulated in 5 days.  Patient denies SOB at present.  VSS.  Past Medical History  Diagnosis Date  . Hypertension   . Depression   . CHF (congestive heart failure) (HCC)   . Acute respiratory failure (HCC)   . Status post tracheostomy (HCC)   . Cardiomyopathy (HCC)   . Apical mural thrombus (HCC)   . Protein calorie malnutrition [facility])    Past Surgical History  Procedure Laterality Date  . Tracheostomy tube placement N/A 02/22/2016    Procedure: TRACHEOSTOMY;  Surgeon: [name], MD;  Location: [facility] OR;  Service: ENT;  Laterality: N/A;   Family History  Problem Relation Age of Onset  . Alcohol abuse Mother   . Alcohol abuse Father   . Alcohol abuse Maternal Uncle    Social History  Substance Use Topics  . Smoking status: Former Games developer  . Smokeless tobacco: None  . Alcohol Use: None    Review of Systems  Respiratory:       Janina Mayo out  All other systems reviewed and are negative.     Allergies  Codeine; Hydrocodone; Hydrocodone-acetaminophen; Lisinopril; and  Tylenol  Home Medications   Prior to Admission medications   Medication Sig Start Date End Date Taking? Authorizing Provider  Amino Acids-Protein Hydrolys (FEEDING SUPPLEMENT, PRO-STAT SUGAR FREE 64,) LIQD Take 30 mLs by mouth daily.    Historical Provider, MD  atorvastatin (LIPITOR) 40 MG tablet Take 40 mg by mouth at bedtime.    Historical Provider, MD  famotidine (PEPCID) 20 MG tablet Take 20 mg by mouth 2 (two) times daily. 10,22    Historical Provider, MD  FLUoxetine (PROZAC) 40 MG capsule Take 40 mg by mouth daily.    Historical Provider, MD  insulin lispro (HUMALOG) 100 UNIT/ML injection once. Use Sliding Scale every 6 hours ; for blood glucose below 70 give 0 un, low dose from 70 to 150 , give 0  Un, from 151 to 200 give 2 un, from 201 to 250 give 4 un, from 251 to 300 give 6 un, from 301 to 350 give 8 un, from 351 to 400 give 10 un, for blood glucose above 400, give 12 un call MD and obtain stat lab verification    Historical Provider, MD  ipratropium-albuterol (DUONEB) 0.5-2.5 (3) MG/3ML SOLN Take 3 mLs by nebulization every 6 (six) hours as needed.    Historical Provider, MD  LORazepam (ATIVAN) 0.5 MG tablet Take 0.5 mg by mouth every 8 (eight) hours as needed for anxiety.    Historical Provider, MD  metoprolol succinate (TOPROL-XL) 25 MG 24 hr tablet Take 25 mg by mouth  2 (two) times daily.    Historical Provider, MD  nicotine (NICODERM CQ - DOSED IN MG/24 HOURS) 14 mg/24hr patch Place 14 mg onto the skin daily.    Historical Provider, MD  Omega-3 Fatty Acids (FISH OIL PO) Take by mouth. Reported on 03/10/2016    Historical Provider, MD  polyethylene glycol (MIRALAX / GLYCOLAX) packet Take 17 g by mouth 2 (two) times daily.    Historical Provider, MD  pregabalin (LYRICA) 50 MG capsule Take 50 mg by mouth 2 (two) times daily.    Historical Provider, MD  vitamin C (ASCORBIC ACID) 500 MG tablet Take 500 mg by mouth 2 (two) times daily. 10,22    Historical Provider, MD  warfarin (COUMADIN)  5 MG tablet Take 5 mg by mouth at bedtime.    Historical Provider, MD  zinc sulfate 220 (50 Zn) MG capsule Take 220 mg by mouth daily. At 1800    Historical Provider, MD   BP 105/68 mmHg  Pulse 60  Temp(Src) 97.5 F (36.4 C) (Oral)  Resp 16  SpO2 97%   Physical Exam  Constitutional: He is oriented to person, place, and time. He appears well-developed and well-nourished.  HENT:  Head: Normocephalic and atraumatic.  Mouth/Throat: Oropharynx is clear and moist.  Eyes: Conjunctivae and EOM are normal. Pupils are equal, round, and reactive to light.  Neck: Normal range of motion.  Tracheostomy stoma mostly closed, no bleeding  Cardiovascular: Normal rate, regular rhythm and normal heart sounds.   Pulmonary/Chest: Effort normal and breath sounds normal. No respiratory distress. He has no wheezes.  Abdominal: Soft. Bowel sounds are normal.  Musculoskeletal: Normal range of motion.  Neurological: He is alert and oriented to person, place, and time.  Skin: Skin is warm and dry.  Psychiatric: He has a normal mood and affect.  Nursing note and vitals reviewed.   ED Course  Procedures (including critical care time) Labs Review Labs Reviewed - No data to display  Imaging Review No results found. I have personally reviewed and evaluated these images and lab results as part of my medical decision-making.   EKG Interpretation None      MDM   Final diagnoses:  Trachea displaced   60 year old male here for dislodged tracheostomy tube. This was replaced earlier today in this ER, however upon returning to his facility patient pulled this out on his own. Apparently he has a history of doing this as this is his 5th ER visit within 9 days for the same.  He is scheduled to have his trach removed anyway in 5 days. Will monitor here in the ED and as long as patient does not have any hypoxia, feel trach can be left out.  7:39 PM Patient has been observed in the ED for 2+ hours without his  trach, no hypoxia or respiratory distress. O2 sats have remained >95% on RA. Patient appears stable for discharge back to his facility.  FU with PCP.  Case discussed with attending physician, Dr. Estell Harpin, who evaluated patient earlier this evening at his prior visit-- agrees with assessment and plan of care.  Garlon Hatchet, PA-C 04/01/16 2103  Bethann Berkshire, MD 04/01/16 [phone]

## 2016-04-01 NOTE — ED Notes (Signed)
[facility] called  

## 2016-04-01 NOTE — ED Notes (Signed)
Patient presents from [facility]. Patient pulled trach out, chronic issue, refused to let facility replace trach.   Last VS: 92% ra, 108/68, 60hr, 24resp.

## 2016-04-01 NOTE — ED Provider Notes (Signed)
CSN: [id number]     Arrival date & time 04/01/16  1359 History   First MD Initiated Contact with Patient 04/01/16 1406     No chief complaint on file.    (Consider location/radiation/quality/duration/timing/severity/associated sxs/prior Treatment) Patient is a 60 y.o. male presenting with shortness of breath. The history is provided by the EMS personnel (EMS states the patient dislodged his tracheostomy tube. The nursing home was unable to put it back in. Patient having minimal dyspnea).  Shortness of Breath Severity:  Mild Onset quality:  Sudden Timing:  Intermittent Progression:  Unchanged Chronicity:  Recurrent Context: not activity   Relieved by:  Nothing Worsened by:  Nothing tried Associated symptoms: no abdominal pain, no chest pain, no cough, no headaches and no rash     Past Medical History  Diagnosis Date  . Hypertension   . Depression   . CHF (congestive heart failure) (HCC)   . Acute respiratory failure (HCC)   . Status post tracheostomy (HCC)   . Cardiomyopathy (HCC)   . Apical mural thrombus (HCC)   . Protein calorie malnutrition [facility])    Past Surgical History  Procedure Laterality Date  . Tracheostomy tube placement N/A 02/22/2016    Procedure: TRACHEOSTOMY;  Surgeon: [name], MD;  Location: [facility] OR;  Service: ENT;  Laterality: N/A;   Family History  Problem Relation Age of Onset  . Alcohol abuse Mother   . Alcohol abuse Father   . Alcohol abuse Maternal Uncle    Social History  Substance Use Topics  . Smoking status: Former Games developer  . Smokeless tobacco: None  . Alcohol Use: None    Review of Systems  Constitutional: Negative for appetite change and fatigue.  HENT: Negative for congestion, ear discharge and sinus pressure.   Eyes: Negative for discharge.  Respiratory: Positive for shortness of breath. Negative for cough.   Cardiovascular: Negative for chest pain.  Gastrointestinal: Negative for abdominal pain and diarrhea.  Genitourinary:  Negative for frequency and hematuria.  Musculoskeletal: Negative for back pain.  Skin: Negative for rash.  Neurological: Negative for seizures and headaches.  Psychiatric/Behavioral: Negative for hallucinations.      Allergies  Codeine; Hydrocodone; Hydrocodone-acetaminophen; Lisinopril; and Tylenol  Home Medications   Prior to Admission medications   Medication Sig Start Date End Date Taking? Authorizing Provider  Amino Acids-Protein Hydrolys (FEEDING SUPPLEMENT, PRO-STAT SUGAR FREE 64,) LIQD Take 30 mLs by mouth daily.    Historical Provider, MD  atorvastatin (LIPITOR) 40 MG tablet Take 40 mg by mouth at bedtime.    Historical Provider, MD  famotidine (PEPCID) 20 MG tablet Take 20 mg by mouth 2 (two) times daily. 10,22    Historical Provider, MD  FLUoxetine (PROZAC) 40 MG capsule Take 40 mg by mouth daily.    Historical Provider, MD  insulin lispro (HUMALOG) 100 UNIT/ML injection once. Use Sliding Scale every 6 hours ; for blood glucose below 70 give 0 un, low dose from 70 to 150 , give 0  Un, from 151 to 200 give 2 un, from 201 to 250 give 4 un, from 251 to 300 give 6 un, from 301 to 350 give 8 un, from 351 to 400 give 10 un, for blood glucose above 400, give 12 un call MD and obtain stat lab verification    Historical Provider, MD  ipratropium-albuterol (DUONEB) 0.5-2.5 (3) MG/3ML SOLN Take 3 mLs by nebulization every 6 (six) hours as needed.    Historical Provider, MD  LORazepam (ATIVAN) 0.5 MG tablet  Take 0.5 mg by mouth every 8 (eight) hours as needed for anxiety.    Historical Provider, MD  metoprolol succinate (TOPROL-XL) 25 MG 24 hr tablet Take 25 mg by mouth 2 (two) times daily.    Historical Provider, MD  nicotine (NICODERM CQ - DOSED IN MG/24 HOURS) 14 mg/24hr patch Place 14 mg onto the skin daily.    Historical Provider, MD  Omega-3 Fatty Acids (FISH OIL PO) Take by mouth. Reported on 03/10/2016    Historical Provider, MD  polyethylene glycol (MIRALAX / GLYCOLAX) packet Take 17  g by mouth 2 (two) times daily.    Historical Provider, MD  pregabalin (LYRICA) 50 MG capsule Take 50 mg by mouth 2 (two) times daily.    Historical Provider, MD  vitamin C (ASCORBIC ACID) 500 MG tablet Take 500 mg by mouth 2 (two) times daily. 10,22    Historical Provider, MD  warfarin (COUMADIN) 5 MG tablet Take 5 mg by mouth at bedtime.    Historical Provider, MD  zinc sulfate 220 (50 Zn) MG capsule Take 220 mg by mouth daily. At 1800    Historical Provider, MD   BP 100/70 mmHg  Pulse 58  Temp(Src) 97.7 F (36.5 C) (Oral)  Resp 18  SpO2 99% Physical Exam  Constitutional: He is oriented to person, place, and time. He appears well-developed.  HENT:  Head: Normocephalic.  Patient has a trach hole where the trach tube has come out.  Eyes: Conjunctivae and EOM are normal. No scleral icterus.  Neck: Neck supple. No thyromegaly present.  Cardiovascular: Normal rate and regular rhythm.  Exam reveals no gallop and no friction rub.   No murmur heard. Pulmonary/Chest: No stridor. He has no wheezes. He has no rales. He exhibits no tenderness.  Abdominal: He exhibits no distension. There is no tenderness. There is no rebound.  Musculoskeletal: Normal range of motion. He exhibits no edema.  Lymphadenopathy:    He has no cervical adenopathy.  Neurological: He is oriented to person, place, and time. He exhibits normal muscle tone. Coordination normal.  Skin: No rash noted. No erythema.  Psychiatric: He has a normal mood and affect. His behavior is normal.    ED Course  Procedures (including critical care time) Labs Review Labs Reviewed - No data to display  Imaging Review No results found. I have personally reviewed and evaluated these images and lab results as part of my medical decision-making.   EKG Interpretation None      MDM   Final diagnoses:  Tracheostomy, acute management [facility])    Respiratory replace the trach to patient was doing fine and discharged home    Bethann Berkshire, MD 04/01/16 1512

## 2016-04-01 NOTE — ED Notes (Signed)
BIB EMS, staff states pt removed his trach tube shortly after being seen here earlier.

## 2016-04-01 NOTE — ED Notes (Signed)
Attempted to call report to nursing home x1. No answer or voicemail option

## 2016-04-01 NOTE — ED Notes (Signed)
[facility] called to transport pt back to [facility] 

## 2016-04-01 NOTE — Discharge Instructions (Signed)
Follow-up with your primary care doctor. °Return here for new concerns. °

## 2016-04-01 NOTE — Progress Notes (Signed)
Per verbal order from ER MD, replaced trach that pt had removed.  Placed a Shiley 6 cuffless.  Stoma was tight, but was able to insert on first try. Pt tolerated well.

## 2016-04-01 NOTE — Discharge Instructions (Signed)
Follow up as needed

## 2016-04-06 ENCOUNTER — Inpatient Hospital Stay (HOSPITAL_COMMUNITY): Admission: RE | Admit: 2016-04-06 | Payer: Medicare Other | Source: Ambulatory Visit

## 2016-04-06 ENCOUNTER — Encounter: Payer: Self-pay | Admitting: Adult Health

## 2016-04-06 ENCOUNTER — Non-Acute Institutional Stay (SKILLED_NURSING_FACILITY): Payer: Medicare Other | Admitting: Adult Health

## 2016-04-06 DIAGNOSIS — T8131XA Disruption of external operation (surgical) wound, not elsewhere classified, initial encounter: Secondary | ICD-10-CM

## 2016-04-06 DIAGNOSIS — M25551 Pain in right hip: Secondary | ICD-10-CM

## 2016-04-06 DIAGNOSIS — I69391 Dysphagia following cerebral infarction: Secondary | ICD-10-CM | POA: Diagnosis not present

## 2016-04-06 NOTE — Progress Notes (Signed)
Patient ID: Curtis Keller, male   DOB: January 18, 1956, 60 y.o.   MRN: [id number]   Location:  [facility] Room Number: 118-A Place of Service:  SNF (31)   CODE STATUS: Full Code  Allergies  Allergen Reactions  . Codeine Anaphylaxis and Nausea And Vomiting  . Hydrocodone Other (See Comments)  . Hydrocodone-Acetaminophen Nausea And Vomiting  . Lisinopril Itching and Rash  . Tylenol [Acetaminophen] Nausea Only    Chief Complaint  Patient presents with  . Acute Visit    Hip Pain    HPI:  He has had a recent fall is complaining of right hip pain. He cannot fully participate in the hpi or ros. His trach has been removed. He had pulled it out numerous times. Staff reports that he has an abdominal wound with mesh exposure present. There are no signs of infection present.    Past Medical History  Diagnosis Date  . Hypertension   . Depression   . CHF (congestive heart failure) (HCC)   . Acute respiratory failure (HCC)   . Status post tracheostomy (HCC)   . Cardiomyopathy (HCC)   . Apical mural thrombus (HCC)   . Protein calorie malnutrition [facility])     Past Surgical History  Procedure Laterality Date  . Tracheostomy tube placement N/A 02/22/2016    Procedure: TRACHEOSTOMY;  Surgeon: [name], MD;  Location: [facility] OR;  Service: ENT;  Laterality: N/A;    Social History   Social History  . Marital Status: Single    Spouse Name: N/A  . Number of Children: N/A  . Years of Education: N/A   Occupational History  . Not on file.   Social History Main Topics  . Smoking status: Former Games developer  . Smokeless tobacco: Not on file  . Alcohol Use: Not on file  . Drug Use: Not on file  . Sexual Activity: Not on file   Other Topics Concern  . Not on file   Social History Narrative   Family History  Problem Relation Age of Onset  . Alcohol abuse Mother   . Alcohol abuse Father   . Alcohol abuse Maternal Uncle       VITAL SIGNS BP  101/64 mmHg  Pulse 70  Temp(Src) 97.3 F (36.3 C) (Oral)  Resp 22  Ht 6\' 2"  (1.88 m)  Wt 205 lb (92.987 kg)  BMI 26.31 kg/m2  SpO2 94%  Patient's Medications  New Prescriptions   No medications on file  Previous Medications   AMINO ACIDS-PROTEIN HYDROLYS (FEEDING SUPPLEMENT, PRO-STAT SUGAR FREE 64,) LIQD    Take 30 mLs by mouth daily.   ATORVASTATIN (LIPITOR) 40 MG TABLET    Take 40 mg by mouth at bedtime.   FAMOTIDINE (PEPCID) 20 MG TABLET    Take 20 mg by mouth 2 (two) times daily.   FLUOXETINE (PROZAC) 40 MG CAPSULE    Take 40 mg by mouth daily.   IBUPROFEN (ADVIL,MOTRIN) 400 MG TABLET    Take 800 mg by mouth every 4 (four) hours as needed.   INSULIN LISPRO (HUMALOG) 100 UNIT/ML INJECTION    once. Use Sliding Scale every 6 hours ; for blood glucose below 70 give 0 un, low dose from 70 to 150 , give 0  Un, from 151 to 200 give 2 un, from 201 to 250 give 4 un, from 251 to 300 give 6 un, from 301 to 350 give 8 un, from 351 to 400 give 10 un, for  blood glucose above 400, give 12 un call MD and obtain stat lab verification   IPRATROPIUM-ALBUTEROL (DUONEB) 0.5-2.5 (3) MG/3ML SOLN    Take 3 mLs by nebulization every 6 (six) hours as needed.   LORAZEPAM (ATIVAN) 0.5 MG TABLET    Take 0.5 mg by mouth every 8 (eight) hours as needed for anxiety.   MENTHOL, TOPICAL ANALGESIC, (BIOFREEZE EX)    Apply topically as needed.   METOPROLOL SUCCINATE (TOPROL-XL) 25 MG 24 HR TABLET    Take 25 mg by mouth 2 (two) times daily.   NICOTINE (NICODERM CQ - DOSED IN MG/24 HOURS) 14 MG/24HR PATCH    Place 14 mg onto the skin daily.   NUTRITIONAL SUPPLEMENTS (NUTRITIONAL SUPPLEMENT PO)    Take by mouth. Mech Soft Texture, Nectar consistency   OMEGA-3 FATTY ACIDS (FISH OIL PO)    Take by mouth. Reported on 03/10/2016   POLYETHYLENE GLYCOL (MIRALAX / GLYCOLAX) PACKET    Take 17 g by mouth 2 (two) times daily.   PREGABALIN (LYRICA) 50 MG CAPSULE    Take 50 mg by mouth 2 (two) times daily.   VITAMIN C (ASCORBIC ACID)  500 MG TABLET    Take 500 mg by mouth 2 (two) times daily. 10,22   WARFARIN (COUMADIN) 5 MG TABLET    Take 5 mg by mouth at bedtime.   ZINC SULFATE 220 (50 ZN) MG CAPSULE    Take 220 mg by mouth daily. At 1800  Modified Medications   No medications on file  Discontinued Medications   No medications on file     SIGNIFICANT DIAGNOSTIC EXAMS  02-24-16: chest x-ray: 1. Tracheostomy tube tip in satisfactory position approximately 5 cm above the carina. 2. Significant reduction in pneumomediastinum and subcutaneous emphysema since the examination 2 days ago. 3. Improved interstitial pulmonary edema, though mild interstitial edema persists. 4. New bibasilar atelectasis and new dense atelectasis and/or pneumonia in the right lower lobe.  04-04-16: right hip and pelvic x-ray: modest osteoarthritis no pelvic or hip fracture   LABS REVIEWED:   02-06-16: hgb a1c 5.2 03-08-16: wbc 8.9; gb 12.8; hct 39.0; mcv 98.5 plt 329; glucose 196; bun 32; creat 0.99; k+ 3.6; na++ 135  03-15-16: wbc 9.7; hb 11.4; hct 35.9; mcv 88.2; plt 303; glucose 155; bun 18.1; creat 0.95; k+ 4.3; na++ 137 03-18-16: inr 2.3 03-22-16: inr 2.7: coumadin 5 mg daily      Review of Systems  Unable to perform ROS: other    Physical Exam  Constitutional: No distress.  Eyes: Conjunctivae are normal.  Neck: Neck supple. No JVD present. No thyromegaly present.  Cardiovascular: Normal rate, regular rhythm and intact distal pulses.   Respiratory: Effort normal and breath sounds normal. No respiratory distress. He has no wheezes.  Janina Mayo has been removed; site without signs of infection present  GI: Soft. Bowel sounds are normal. He exhibits no distension. There is no tenderness.  Has peg tube  Musculoskeletal: He exhibits no edema.  Able to move all extremities  Has left side weakness   Lymphadenopathy:    He has no cervical adenopathy.  Neurological: He is alert.  Skin: Skin is warm and dry. He is not diaphoretic.  Has  dehiscence of abdomen with mesh exposed no drainage or odor present.   Psychiatric: He has a normal mood and affect.       ASSESSMENT/ PLAN:   1.  Right  hip pain 2. Dysphagia 3. Constipation 4. Wound dehiscence   Will continue prn  motrin Will stop the miralax due to thickened liquids Will begin senna s 2 tabs nightly He is to see Dr. Linard Millers on 04-11-16 for his wound; will continue wound care and will monitor    Time spent with patient 45  minutes >50% time spent counseling; reviewing medical record; tests; labs; and developing future plan of care     Synthia Innocent NP [facility]  Contact [phone] Monday through Friday 8am- 5pm  After hours call [phone]

## 2016-04-08 DIAGNOSIS — T8131XA Disruption of external operation (surgical) wound, not elsewhere classified, initial encounter: Secondary | ICD-10-CM | POA: Insufficient documentation

## 2016-04-08 DIAGNOSIS — M25551 Pain in right hip: Secondary | ICD-10-CM | POA: Insufficient documentation

## 2016-04-11 DIAGNOSIS — Z7901 Long term (current) use of anticoagulants: Secondary | ICD-10-CM | POA: Diagnosis not present

## 2016-04-11 DIAGNOSIS — G8194 Hemiplegia, unspecified affecting left nondominant side: Secondary | ICD-10-CM | POA: Diagnosis not present

## 2016-04-11 DIAGNOSIS — T8579XA Infection and inflammatory reaction due to other internal prosthetic devices, implants and grafts, initial encounter: Secondary | ICD-10-CM | POA: Diagnosis not present

## 2016-04-11 DIAGNOSIS — Z8673 Personal history of transient ischemic attack (TIA), and cerebral infarction without residual deficits: Secondary | ICD-10-CM | POA: Diagnosis not present

## 2016-04-11 DIAGNOSIS — S36039S Unspecified laceration of spleen, sequela: Secondary | ICD-10-CM | POA: Diagnosis not present

## 2016-04-11 DIAGNOSIS — Z7982 Long term (current) use of aspirin: Secondary | ICD-10-CM | POA: Diagnosis not present

## 2016-04-14 DIAGNOSIS — E785 Hyperlipidemia, unspecified: Secondary | ICD-10-CM | POA: Diagnosis not present

## 2016-04-14 DIAGNOSIS — T8579XA Infection and inflammatory reaction due to other internal prosthetic devices, implants and grafts, initial encounter: Secondary | ICD-10-CM | POA: Diagnosis not present

## 2016-04-14 DIAGNOSIS — S36039S Unspecified laceration of spleen, sequela: Secondary | ICD-10-CM | POA: Diagnosis not present

## 2016-04-14 DIAGNOSIS — J45909 Unspecified asthma, uncomplicated: Secondary | ICD-10-CM | POA: Diagnosis not present

## 2016-04-14 DIAGNOSIS — J449 Chronic obstructive pulmonary disease, unspecified: Secondary | ICD-10-CM | POA: Diagnosis not present

## 2016-04-14 DIAGNOSIS — G8194 Hemiplegia, unspecified affecting left nondominant side: Secondary | ICD-10-CM | POA: Diagnosis not present

## 2016-04-14 DIAGNOSIS — L02211 Cutaneous abscess of abdominal wall: Secondary | ICD-10-CM | POA: Diagnosis not present

## 2016-04-15 DIAGNOSIS — J45909 Unspecified asthma, uncomplicated: Secondary | ICD-10-CM | POA: Diagnosis not present

## 2016-04-15 DIAGNOSIS — L98492 Non-pressure chronic ulcer of skin of other sites with fat layer exposed: Secondary | ICD-10-CM | POA: Diagnosis not present

## 2016-04-15 DIAGNOSIS — I5022 Chronic systolic (congestive) heart failure: Secondary | ICD-10-CM | POA: Diagnosis not present

## 2016-04-15 DIAGNOSIS — N390 Urinary tract infection, site not specified: Secondary | ICD-10-CM | POA: Diagnosis not present

## 2016-04-15 DIAGNOSIS — Z4682 Encounter for fitting and adjustment of non-vascular catheter: Secondary | ICD-10-CM | POA: Diagnosis not present

## 2016-04-15 DIAGNOSIS — D649 Anemia, unspecified: Secondary | ICD-10-CM | POA: Diagnosis not present

## 2016-04-15 DIAGNOSIS — I4891 Unspecified atrial fibrillation: Secondary | ICD-10-CM | POA: Diagnosis not present

## 2016-04-15 DIAGNOSIS — G8194 Hemiplegia, unspecified affecting left nondominant side: Secondary | ICD-10-CM | POA: Diagnosis not present

## 2016-04-15 DIAGNOSIS — I469 Cardiac arrest, cause unspecified: Secondary | ICD-10-CM | POA: Diagnosis not present

## 2016-04-15 DIAGNOSIS — I513 Intracardiac thrombosis, not elsewhere classified: Secondary | ICD-10-CM | POA: Diagnosis not present

## 2016-04-15 DIAGNOSIS — M1612 Unilateral primary osteoarthritis, left hip: Secondary | ICD-10-CM | POA: Diagnosis not present

## 2016-04-15 DIAGNOSIS — J449 Chronic obstructive pulmonary disease, unspecified: Secondary | ICD-10-CM | POA: Diagnosis not present

## 2016-04-15 DIAGNOSIS — J9600 Acute respiratory failure, unspecified whether with hypoxia or hypercapnia: Secondary | ICD-10-CM | POA: Diagnosis not present

## 2016-04-15 DIAGNOSIS — E46 Unspecified protein-calorie malnutrition: Secondary | ICD-10-CM | POA: Diagnosis not present

## 2016-04-15 DIAGNOSIS — I517 Cardiomegaly: Secondary | ICD-10-CM | POA: Diagnosis not present

## 2016-04-15 DIAGNOSIS — Z930 Tracheostomy status: Secondary | ICD-10-CM | POA: Diagnosis not present

## 2016-04-15 DIAGNOSIS — J9690 Respiratory failure, unspecified, unspecified whether with hypoxia or hypercapnia: Secondary | ICD-10-CM | POA: Diagnosis not present

## 2016-04-15 DIAGNOSIS — K9422 Gastrostomy infection: Secondary | ICD-10-CM | POA: Diagnosis not present

## 2016-04-15 DIAGNOSIS — T8579XD Infection and inflammatory reaction due to other internal prosthetic devices, implants and grafts, subsequent encounter: Secondary | ICD-10-CM | POA: Diagnosis not present

## 2016-04-15 DIAGNOSIS — S36039S Unspecified laceration of spleen, sequela: Secondary | ICD-10-CM | POA: Diagnosis not present

## 2016-04-15 DIAGNOSIS — R69 Illness, unspecified: Secondary | ICD-10-CM | POA: Diagnosis not present

## 2016-04-15 DIAGNOSIS — T8131XD Disruption of external operation (surgical) wound, not elsewhere classified, subsequent encounter: Secondary | ICD-10-CM | POA: Diagnosis not present

## 2016-04-15 DIAGNOSIS — L259 Unspecified contact dermatitis, unspecified cause: Secondary | ICD-10-CM | POA: Diagnosis not present

## 2016-04-15 DIAGNOSIS — I6930 Unspecified sequelae of cerebral infarction: Secondary | ICD-10-CM | POA: Diagnosis not present

## 2016-04-15 DIAGNOSIS — L22 Diaper dermatitis: Secondary | ICD-10-CM | POA: Diagnosis not present

## 2016-04-15 DIAGNOSIS — G4751 Confusional arousals: Secondary | ICD-10-CM | POA: Diagnosis not present

## 2016-04-15 DIAGNOSIS — R7611 Nonspecific reaction to tuberculin skin test without active tuberculosis: Secondary | ICD-10-CM | POA: Diagnosis not present

## 2016-04-15 DIAGNOSIS — E782 Mixed hyperlipidemia: Secondary | ICD-10-CM | POA: Diagnosis not present

## 2016-04-15 DIAGNOSIS — R791 Abnormal coagulation profile: Secondary | ICD-10-CM | POA: Diagnosis not present

## 2016-04-15 DIAGNOSIS — Z931 Gastrostomy status: Secondary | ICD-10-CM | POA: Diagnosis not present

## 2016-04-15 DIAGNOSIS — E119 Type 2 diabetes mellitus without complications: Secondary | ICD-10-CM | POA: Diagnosis not present

## 2016-04-15 DIAGNOSIS — E785 Hyperlipidemia, unspecified: Secondary | ICD-10-CM | POA: Diagnosis not present

## 2016-04-15 DIAGNOSIS — I5032 Chronic diastolic (congestive) heart failure: Secondary | ICD-10-CM | POA: Diagnosis not present

## 2016-04-15 DIAGNOSIS — I2109 ST elevation (STEMI) myocardial infarction involving other coronary artery of anterior wall: Secondary | ICD-10-CM | POA: Diagnosis not present

## 2016-04-15 DIAGNOSIS — T8579XA Infection and inflammatory reaction due to other internal prosthetic devices, implants and grafts, initial encounter: Secondary | ICD-10-CM | POA: Diagnosis not present

## 2016-04-15 DIAGNOSIS — A4102 Sepsis due to Methicillin resistant Staphylococcus aureus: Secondary | ICD-10-CM | POA: Diagnosis not present

## 2016-04-15 DIAGNOSIS — I213 ST elevation (STEMI) myocardial infarction of unspecified site: Secondary | ICD-10-CM | POA: Diagnosis not present

## 2016-04-15 DIAGNOSIS — R2231 Localized swelling, mass and lump, right upper limb: Secondary | ICD-10-CM | POA: Diagnosis not present

## 2016-04-15 DIAGNOSIS — S3600XA Unspecified injury of spleen, initial encounter: Secondary | ICD-10-CM | POA: Diagnosis not present

## 2016-04-15 DIAGNOSIS — L98493 Non-pressure chronic ulcer of skin of other sites with necrosis of muscle: Secondary | ICD-10-CM | POA: Diagnosis not present

## 2016-04-15 DIAGNOSIS — Z79899 Other long term (current) drug therapy: Secondary | ICD-10-CM | POA: Diagnosis not present

## 2016-04-15 DIAGNOSIS — I10 Essential (primary) hypertension: Secondary | ICD-10-CM | POA: Diagnosis not present

## 2016-04-15 DIAGNOSIS — K219 Gastro-esophageal reflux disease without esophagitis: Secondary | ICD-10-CM | POA: Diagnosis not present

## 2016-04-15 DIAGNOSIS — M1712 Unilateral primary osteoarthritis, left knee: Secondary | ICD-10-CM | POA: Diagnosis not present

## 2016-04-15 DIAGNOSIS — I429 Cardiomyopathy, unspecified: Secondary | ICD-10-CM | POA: Diagnosis not present

## 2016-04-15 DIAGNOSIS — R4189 Other symptoms and signs involving cognitive functions and awareness: Secondary | ICD-10-CM | POA: Diagnosis not present

## 2016-04-15 DIAGNOSIS — S31102A Unspecified open wound of abdominal wall, epigastric region without penetration into peritoneal cavity, initial encounter: Secondary | ICD-10-CM | POA: Diagnosis not present

## 2016-04-15 DIAGNOSIS — R2689 Other abnormalities of gait and mobility: Secondary | ICD-10-CM | POA: Diagnosis not present

## 2016-04-15 DIAGNOSIS — F418 Other specified anxiety disorders: Secondary | ICD-10-CM | POA: Diagnosis not present

## 2016-04-15 DIAGNOSIS — Z9081 Acquired absence of spleen: Secondary | ICD-10-CM | POA: Diagnosis not present

## 2016-04-15 DIAGNOSIS — F329 Major depressive disorder, single episode, unspecified: Secondary | ICD-10-CM | POA: Diagnosis not present

## 2016-04-15 DIAGNOSIS — L98499 Non-pressure chronic ulcer of skin of other sites with unspecified severity: Secondary | ICD-10-CM | POA: Diagnosis not present

## 2016-04-15 DIAGNOSIS — F0390 Unspecified dementia without behavioral disturbance: Secondary | ICD-10-CM | POA: Diagnosis not present

## 2016-04-15 DIAGNOSIS — F419 Anxiety disorder, unspecified: Secondary | ICD-10-CM | POA: Diagnosis not present

## 2016-04-15 DIAGNOSIS — Z431 Encounter for attention to gastrostomy: Secondary | ICD-10-CM | POA: Diagnosis not present

## 2016-04-15 DIAGNOSIS — J188 Other pneumonia, unspecified organism: Secondary | ICD-10-CM | POA: Diagnosis not present

## 2016-04-15 DIAGNOSIS — I69391 Dysphagia following cerebral infarction: Secondary | ICD-10-CM | POA: Diagnosis not present

## 2016-04-15 DIAGNOSIS — Z794 Long term (current) use of insulin: Secondary | ICD-10-CM | POA: Diagnosis not present

## 2016-04-15 DIAGNOSIS — H2513 Age-related nuclear cataract, bilateral: Secondary | ICD-10-CM | POA: Diagnosis not present

## 2016-04-15 DIAGNOSIS — W19XXXD Unspecified fall, subsequent encounter: Secondary | ICD-10-CM | POA: Diagnosis not present

## 2016-04-15 DIAGNOSIS — R319 Hematuria, unspecified: Secondary | ICD-10-CM | POA: Diagnosis not present

## 2016-04-15 DIAGNOSIS — R1310 Dysphagia, unspecified: Secondary | ICD-10-CM | POA: Diagnosis not present

## 2016-04-15 DIAGNOSIS — Z7901 Long term (current) use of anticoagulants: Secondary | ICD-10-CM | POA: Diagnosis not present

## 2016-04-18 ENCOUNTER — Non-Acute Institutional Stay (SKILLED_NURSING_FACILITY): Payer: Medicare Other | Admitting: Internal Medicine

## 2016-04-18 ENCOUNTER — Encounter: Payer: Self-pay | Admitting: Internal Medicine

## 2016-04-18 DIAGNOSIS — Z931 Gastrostomy status: Secondary | ICD-10-CM

## 2016-04-18 DIAGNOSIS — Z7901 Long term (current) use of anticoagulants: Secondary | ICD-10-CM

## 2016-04-18 DIAGNOSIS — E785 Hyperlipidemia, unspecified: Secondary | ICD-10-CM

## 2016-04-18 DIAGNOSIS — I513 Intracardiac thrombosis, not elsewhere classified: Secondary | ICD-10-CM

## 2016-04-18 DIAGNOSIS — I5022 Chronic systolic (congestive) heart failure: Secondary | ICD-10-CM | POA: Diagnosis not present

## 2016-04-18 DIAGNOSIS — T8579XD Infection and inflammatory reaction due to other internal prosthetic devices, implants and grafts, subsequent encounter: Secondary | ICD-10-CM | POA: Diagnosis not present

## 2016-04-18 DIAGNOSIS — K219 Gastro-esophageal reflux disease without esophagitis: Secondary | ICD-10-CM

## 2016-04-18 DIAGNOSIS — Z9081 Acquired absence of spleen: Secondary | ICD-10-CM | POA: Diagnosis not present

## 2016-04-18 DIAGNOSIS — F329 Major depressive disorder, single episode, unspecified: Secondary | ICD-10-CM | POA: Diagnosis not present

## 2016-04-18 DIAGNOSIS — J449 Chronic obstructive pulmonary disease, unspecified: Secondary | ICD-10-CM | POA: Insufficient documentation

## 2016-04-18 DIAGNOSIS — F32A Depression, unspecified: Secondary | ICD-10-CM

## 2016-04-18 DIAGNOSIS — I213 ST elevation (STEMI) myocardial infarction of unspecified site: Secondary | ICD-10-CM | POA: Diagnosis not present

## 2016-04-18 DIAGNOSIS — T8131XD Disruption of external operation (surgical) wound, not elsewhere classified, subsequent encounter: Secondary | ICD-10-CM | POA: Diagnosis not present

## 2016-04-18 NOTE — Progress Notes (Signed)
MRN: [id number] Name: Curtis Keller  Sex: male Age: 60 y.o. DOB: December 17, 1955  PSC #:  Facility/Room: [facility] / 124 B Level Of Care: SNF Provider: Randon Goldsmith. Lyn Hollingshead, MD Emergency Contacts: Extended Emergency Contact Information Primary Emergency Contact: Wynell Balloon [place] Home Phone: [phone] Mobile Phone: [phone] Relation: Sister Secondary Emergency Contact: Neita Carp [place] Mobile Phone: [phone] Relation: Daughter  Code Status: Full Code  Allergies: Codeine; Hydrocodone; Hydrocodone-acetaminophen; Lisinopril; and Tylenol  Chief Complaint  Patient presents with  . New Admit To SNF    Admit to Facility    HPI: Patient is 60 y.o. male who was admitted to [facility] from 6/22-23 for removal of infected mesh and wound vac placement from abdominal hernia repair 10 years ago. Pt had a splenectomy in 12/2015 after a fall and ruptured spleen which may have played a role. Had been at [facility] prior for thrombus in the heart leading to stroke, had splenectomy. Janina Mayo has been removed, still has G tube.Pt is admitted to SNF for generalized weakness, wound care and OT/PT. While at SNF pt will be followed for HLD, tx with lipitor and fish oil, GERD tx with pepcid and Chronic systolic heart failure, tx with lasix and metoprolol.  Past Medical History  Diagnosis Date  . Hypertension   . Depression   . CHF (congestive heart failure) (HCC)   . Acute respiratory failure (HCC)   . Status post tracheostomy (HCC)   . Cardiomyopathy (HCC)   . Apical mural thrombus (HCC)   . Protein calorie malnutrition [facility])     Past Surgical History  Procedure Laterality Date  . Tracheostomy tube placement N/A 02/22/2016    Procedure: TRACHEOSTOMY;  Surgeon: [name], MD;  Location: [facility] OR;  Service: ENT;  Laterality: N/A;  . Coronary angioplasty with stent placement    . Cardiac catheterization    . Pr lap, splenectomy  01/07/2016       Medication List       This list is accurate as of: 04/18/16 11:59 PM.  Always use your most recent med list.               atorvastatin 40 MG tablet  Commonly known as:  LIPITOR  Take 40 mg by mouth at bedtime.     BIOFREEZE EX  Apply topically as needed.     enoxaparin 150 MG/ML injection  Commonly known as:  LOVENOX  Inject 50 mg into the skin every 12 (twelve) hours.     famotidine 20 MG tablet  Commonly known as:  PEPCID  Take 20 mg by mouth 2 (two) times daily.     feeding supplement (PRO-STAT SUGAR FREE 64) Liqd  Take 30 mLs by mouth 3 (three) times daily with meals. Pro Stat Awc 17-100 gram-kcal     FISH OIL PO  Take by mouth. Reported on 03/10/2016     FLUoxetine 40 MG capsule  Commonly known as:  PROZAC  Take 40 mg by mouth daily.     furosemide 10 MG/ML solution  Commonly known as:  LASIX  Take 20 mg by mouth daily. Into a venous catheter     HUMALOG 100 UNIT/ML injection  Generic drug:  insulin lispro  once. Use Sliding Scale every 6 hours ; for blood glucose below 70 give 0 un, low dose from 70 to 150 , give 0  Un, from 151 to 200 give 2 un, from 201 to 250 give 4 un, from 251 to  300 give 6 un, from 301 to 350 give 8 un, from 351 to 400 give 10 un, for blood glucose above 400, give 12 un call MD and obtain stat lab verification     ibuprofen 200 MG tablet  Commonly known as:  ADVIL,MOTRIN  Take 200 mg by mouth every 6 (six) hours as needed.     ipratropium-albuterol 0.5-2.5 (3) MG/3ML Soln  Commonly known as:  DUONEB  Take 3 mLs by nebulization every 4 (four) hours as needed (via trach).     LORazepam 0.5 MG tablet  Commonly known as:  ATIVAN  Take 0.5 mg by mouth every 8 (eight) hours as needed for anxiety.     metoprolol succinate 25 MG 24 hr tablet  Commonly known as:  TOPROL-XL  Take 25 mg by mouth 2 (two) times daily.     nicotine 14 mg/24hr patch  Commonly known as:  NICODERM CQ - dosed in mg/24 hours  Place 14 mg onto the skin daily.      OXYGEN  Inhale 3 L into the lungs as needed. For SOB     pregabalin 75 MG capsule  Commonly known as:  LYRICA  Take 75 mg by mouth 2 (two) times daily.     SENNA LAX PO  Take by mouth nightly     vitamin C 500 MG tablet  Commonly known as:  ASCORBIC ACID  Take 500 mg by mouth 2 (two) times daily. 10,22     warfarin 5 MG tablet  Commonly known as:  COUMADIN  Take 5 mg by mouth at bedtime.     zinc sulfate 220 (50 Zn) MG capsule  Take 220 mg by mouth daily. At 1800        Meds ordered this encounter  Medications  . OXYGEN    Sig: Inhale 3 L into the lungs as needed. For SOB  . enoxaparin (LOVENOX) 150 MG/ML injection    Sig: Inject 50 mg into the skin every 12 (twelve) hours.  . furosemide (LASIX) 10 MG/ML solution    Sig: Take 20 mg by mouth daily. Into a venous catheter  . ibuprofen (ADVIL,MOTRIN) 200 MG tablet    Sig: Take 200 mg by mouth every 6 (six) hours as needed.  . pregabalin (LYRICA) 75 MG capsule    Sig: Take 75 mg by mouth 2 (two) times daily.  . Amino Acids-Protein Hydrolys (FEEDING SUPPLEMENT, PRO-STAT SUGAR FREE 64,) LIQD    Sig: Take 30 mLs by mouth 3 (three) times daily with meals. Pro Stat Awc 17-100 gram-kcal  . Sennosides (SENNA LAX PO)    Sig: Take by mouth nightly     There is no immunization history on file for this patient.  Social History  Substance Use Topics  . Smoking status: Current Every Day Smoker -- 2.00 packs/day for 38 years    Types: Cigarettes  . Smokeless tobacco: Never Used  . Alcohol Use: No    Family history is   Family History  Problem Relation Age of Onset  . Alcohol abuse Mother   . Alcohol abuse Father   . Alcohol abuse Maternal Uncle   . Hypertension Mother   . Coronary artery disease Mother   . Hypertension Father   . Coronary artery disease Father       Review of Systems  DATA OBTAINED: from patient, nurse GENERAL:  no fevers, fatigue, appetite changes SKIN: No itching, rash; abd wound  healing EYES: No eye pain, redness, discharge EARS: No  earache, tinnitus, change in hearing NOSE: No congestion, drainage or bleeding  MOUTH/THROAT: No mouth or tooth pain, No sore throat RESPIRATORY: No cough, wheezing, SOB CARDIAC: No chest pain, palpitations, lower extremity edema  GI: No abdominal pain, No N/V/D or constipation, No heartburn or reflux  GU: No dysuria, frequency or urgency, or incontinence  MUSCULOSKELETAL: No unrelieved bone/joint pain NEUROLOGIC: No headache, dizziness or focal weakness PSYCHIATRIC: No c/o anxiety or sadness   Filed Vitals:   04/18/16 0915  BP: 110/80  Pulse: 72  Temp: 97.7 F (36.5 C)  Resp: 20    SpO2 Readings from Last 1 Encounters:  04/18/16 95%        Physical Exam  GENERAL APPEARANCE: Alert, conversant,  No acute distress.  SKIN: No diaphoresis rash HEAD: Normocephalic, atraumatic  EYES: Conjunctiva/lids clear. Pupils round, reactive. EOMs intact.  EARS: External exam WNL, canals clear. Hearing grossly normal.  NOSE: No deformity or discharge.  MOUTH/THROAT: Lips w/o lesions  RESPIRATORY: Breathing is even, unlabored. Lung sounds are clear   CARDIOVASCULAR: Heart RRR no murmurs, rubs or gallops. No peripheral edema.   GASTROINTESTINAL: Abdomen is soft, non-tender, not distended w/ normal bowel sounds; abd wound dressed, reported looks good, wound vac in place GENITOURINARY: Bladder non tender, not distended  MUSCULOSKELETAL: No abnormal joints or musculature NEUROLOGIC:  Cranial nerves 2-12 grossly intact; L side weakness PSYCHIATRIC: Mood and affect appropriate to situation, no behavioral issues  Patient Active Problem List   Diagnosis Date Noted  . Infected prosthetic mesh of abdominal wall (HCC) 05/07/2016  . GERD (gastroesophageal reflux disease) 05/07/2016  . COPD (chronic obstructive pulmonary disease) (HCC) 04/18/2016  . Acute right hip pain 04/08/2016  . Dehiscence of incision 04/08/2016  . Chronic  anticoagulation 03/22/2016  . S/P splenectomy 03/13/2016  . PEG (percutaneous endoscopic gastrostomy) status (HCC) 03/13/2016  . MRSA (methicillin resistant Staphylococcus aureus) septicemia (HCC) 03/13/2016  . Aspiration pneumonia (HCC) 03/13/2016  . Dysphagia following cerebrovascular accident 03/13/2016  . Acute blood loss anemia 03/13/2016  . Hyperlipidemia 03/13/2016  . Heart failure, chronic systolic (HCC)   . Hypertension   . Depression   . Status post tracheostomy (HCC)   . Cardiomyopathy (HCC)   . Apical mural thrombus (HCC)   . Protein calorie malnutrition (HCC)   . Intraparenchymal hematoma of brain due to trauma [facility])        Component Value Date/Time   WBC 9.7 03/15/2016   WBC 8.9 03/08/2016 0951   RBC 3.96* 03/08/2016 0951   HGB 11.4* 03/15/2016   HCT 36* 03/15/2016   PLT 303 03/15/2016   MCV 98.5 03/08/2016 0951   LYMPHSABS 2.5 03/04/2016 0720   MONOABS 1.2* 03/04/2016 0720   EOSABS 0.8* 03/04/2016 0720   BASOSABS 0.1 03/04/2016 0720        Component Value Date/Time   NA 137 03/15/2016   NA 135 03/08/2016 0951   K 4.3 03/15/2016   CL 98* 03/08/2016 0951   CO2 29 03/08/2016 0951   GLUCOSE 196* 03/08/2016 0951   BUN 18 03/15/2016   BUN 32* 03/08/2016 0951   CREATININE 1.0 03/15/2016   CREATININE 0.99 03/08/2016 0951   CALCIUM 9.4 03/08/2016 0951   PROT 6.7 01/25/2016 0901   ALBUMIN 1.7* 01/25/2016 0901   AST 47* 01/25/2016 0901   ALT 34 01/25/2016 0901   ALKPHOS 118 01/25/2016 0901   BILITOT 1.2 01/25/2016 0901   GFRNONAA >60 03/08/2016 0951   GFRAA >60 03/08/2016 0951    Lab Results  Component Value  Date   HGBA1C 5.2 02/06/2016    No results found for: CHOL, HDL, LDLCALC, LDLDIRECT, TRIG, CHOLHDL   No results found.  Not all labs, radiology exams or other studies done during hospitalization come through on my EPIC note; however they are reviewed by me.    Assessment and Plan  WOUND DEHISENCE/INFECTED MESH - underwent surgery 6/22 to  remove as much infected mesh as possible; pt did well SNF - admitted for wound care and OT/PT  Apical mural thrombus (HCC) SNF - cause of stroke;on chronic coumadin;will be actively titrating  Heart failure, chronic systolic (HCC) SNF - stable;cont lasix and metoprolol  Hyperlipidemia SNF - stable-cont lipitor 40 mg daily and fish oil daily  GERD (gastroesophageal reflux disease) SNF - cont pepcid 20 mg BID  Depression SNF - stable;cont prozac 40 mg daily   Time spent > 45 min;> 50% of time with patient was spent reviewing records, labs, tests and studies, counseling and developing plan of care  Thurston Hole D. Lyn Hollingshead, MD

## 2016-04-27 ENCOUNTER — Non-Acute Institutional Stay (SKILLED_NURSING_FACILITY): Payer: Medicare Other | Admitting: Adult Health

## 2016-04-27 DIAGNOSIS — T8579XD Infection and inflammatory reaction due to other internal prosthetic devices, implants and grafts, subsequent encounter: Secondary | ICD-10-CM

## 2016-05-06 DIAGNOSIS — L98493 Non-pressure chronic ulcer of skin of other sites with necrosis of muscle: Secondary | ICD-10-CM | POA: Diagnosis not present

## 2016-05-06 DIAGNOSIS — L259 Unspecified contact dermatitis, unspecified cause: Secondary | ICD-10-CM | POA: Diagnosis not present

## 2016-05-07 ENCOUNTER — Encounter: Payer: Self-pay | Admitting: Internal Medicine

## 2016-05-07 DIAGNOSIS — K219 Gastro-esophageal reflux disease without esophagitis: Secondary | ICD-10-CM | POA: Insufficient documentation

## 2016-05-07 DIAGNOSIS — T8579XA Infection and inflammatory reaction due to other internal prosthetic devices, implants and grafts, initial encounter: Secondary | ICD-10-CM | POA: Insufficient documentation

## 2016-05-07 NOTE — Assessment & Plan Note (Signed)
SNF - cont pepcid 20 mg BID 

## 2016-05-07 NOTE — Assessment & Plan Note (Signed)
SNF - stable;cont prozac 40 mg daily

## 2016-05-07 NOTE — Assessment & Plan Note (Signed)
SNF - stable;cont lasix and metoprolol

## 2016-05-07 NOTE — Assessment & Plan Note (Signed)
SNF - cause of stroke;on chronic coumadin;will be actively titrating

## 2016-05-07 NOTE — Assessment & Plan Note (Signed)
SNF - stable-cont lipitor 40 mg daily and fish oil daily

## 2016-05-09 NOTE — Progress Notes (Signed)
Location:   [facility]    Place of Service:  SNF (31)   CODE STATUS: full code   Allergies  Allergen Reactions  . Codeine Anaphylaxis and Nausea And Vomiting  . Hydrocodone Other (See Comments)  . Hydrocodone-Acetaminophen Nausea And Vomiting  . Lisinopril Itching and Rash  . Tylenol [Acetaminophen] Nausea Only    Chief Complaint  Patient presents with  . Acute Visit    wound management     HPI:  I have been asked to review his abdominal wound. The surgeon does want to put a wound vac on his wound bed; and also feels as though he will need abt for his early cellulitis. There are no reports of fever present. He is unable to fully participate in the hpi or ros.    Past Medical History  Diagnosis Date  . Hypertension   . Depression   . CHF (congestive heart failure) (HCC)   . Acute respiratory failure (HCC)   . Status post tracheostomy (HCC)   . Cardiomyopathy (HCC)   . Apical mural thrombus (HCC)   . Protein calorie malnutrition [facility])     Past Surgical History  Procedure Laterality Date  . Tracheostomy tube placement N/A 02/22/2016    Procedure: TRACHEOSTOMY;  Surgeon: [name], MD;  Location: [facility] OR;  Service: ENT;  Laterality: N/A;  . Coronary angioplasty with stent placement    . Cardiac catheterization    . Pr lap, splenectomy  01/07/2016    Social History   Social History  . Marital Status: Single    Spouse Name: N/A  . Number of Children: N/A  . Years of Education: N/A   Occupational History  . Not on file.   Social History Main Topics  . Smoking status: Current Every Day Smoker -- 2.00 packs/day for 38 years    Types: Cigarettes  . Smokeless tobacco: Never Used  . Alcohol Use: No  . Drug Use: No  . Sexual Activity: Not on file   Other Topics Concern  . Not on file   Social History Narrative   Family History  Problem Relation Age of Onset  . Alcohol abuse Mother   . Alcohol abuse Father   . Alcohol abuse Maternal Uncle   .  Hypertension Mother   . Coronary artery disease Mother   . Hypertension Father   . Coronary artery disease Father       VITAL SIGNS BP 108/65 mmHg  Pulse 98  Ht  (1.88 m)  Wt 205 lb (92.987 kg)  BMI 26.31 kg/m2  Patient's Medications  New Prescriptions   No medications on file  Previous Medications   AMINO ACIDS-PROTEIN HYDROLYS (FEEDING SUPPLEMENT, PRO-STAT SUGAR FREE 64,) LIQD    Take 30 mLs by mouth 3 (three) times daily with meals. Pro Stat Awc 17-100 gram-kcal   ATORVASTATIN (LIPITOR) 40 MG TABLET    Take 40 mg by mouth at bedtime.   ENOXAPARIN (LOVENOX) 150 MG/ML INJECTION    Inject 50 mg into the skin every 12 (twelve) hours.   FAMOTIDINE (PEPCID) 20 MG TABLET    Take 20 mg by mouth 2 (two) times daily.   FLUOXETINE (PROZAC) 40 MG CAPSULE    Take 40 mg by mouth daily.   FUROSEMIDE (LASIX) 10 MG/ML SOLUTION    Take 20 mg by mouth daily. Into a venous catheter   IBUPROFEN (ADVIL,MOTRIN) 200 MG TABLET    Take 200 mg by mouth every 6 (six) hours as needed.  INSULIN LISPRO (HUMALOG) 100 UNIT/ML INJECTION    once. Use Sliding Scale every 6 hours ; for blood glucose below 70 give 0 un, low dose from 70 to 150 , give 0  Un, from 151 to 200 give 2 un, from 201 to 250 give 4 un, from 251 to 300 give 6 un, from 301 to 350 give 8 un, from 351 to 400 give 10 un, for blood glucose above 400, give 12 un call MD and obtain stat lab verification   IPRATROPIUM-ALBUTEROL (DUONEB) 0.5-2.5 (3) MG/3ML SOLN    Take 3 mLs by nebulization every 4 (four) hours as needed (via trach).    LORAZEPAM (ATIVAN) 0.5 MG TABLET    Take 0.5 mg by mouth every 8 (eight) hours as needed for anxiety.   MENTHOL, TOPICAL ANALGESIC, (BIOFREEZE EX)    Apply topically as needed.   METOPROLOL SUCCINATE (TOPROL-XL) 25 MG 24 HR TABLET    Take 25 mg by mouth 2 (two) times daily.   NICOTINE (NICODERM CQ - DOSED IN MG/24 HOURS) 14 MG/24HR PATCH    Place 14 mg onto the skin daily.   OMEGA-3 FATTY ACIDS (FISH OIL PO)    Take  by mouth. Reported on 03/10/2016   OXYGEN    Inhale 3 L into the lungs as needed. For SOB   PREGABALIN (LYRICA) 75 MG CAPSULE    Take 75 mg by mouth 2 (two) times daily.   SENNOSIDES (SENNA LAX PO)    Take by mouth nightly   VITAMIN C (ASCORBIC ACID) 500 MG TABLET    Take 500 mg by mouth 2 (two) times daily. 10,22   WARFARIN (COUMADIN) 5 MG TABLET    Take 5 mg by mouth at bedtime.   ZINC SULFATE 220 (50 ZN) MG CAPSULE    Take 220 mg by mouth daily. At 1800  Modified Medications   No medications on file  Discontinued Medications   No medications on file     SIGNIFICANT DIAGNOSTIC EXAMS   02-24-16: chest x-ray: 1. Tracheostomy tube tip in satisfactory position approximately 5 cm above the carina. 2. Significant reduction in pneumomediastinum and subcutaneous emphysema since the examination 2 days ago. 3. Improved interstitial pulmonary edema, though mild interstitial edema persists. 4. New bibasilar atelectasis and new dense atelectasis and/or pneumonia in the right lower lobe.  04-04-16: right hip and pelvic x-ray: modest osteoarthritis no pelvic or hip fracture   LABS REVIEWED:   02-06-16: hgb a1c 5.2 03-08-16: wbc 8.9; gb 12.8; hct 39.0; mcv 98.5 plt 329; glucose 196; bun 32; creat 0.99; k+ 3.6; na++ 135  03-15-16: wbc 9.7; hb 11.4; hct 35.9; mcv 88.2; plt 303; glucose 155; bun 18.1; creat 0.95; k+ 4.3; na++ 137 03-18-16: inr 2.3 03-22-16: inr 2.7: coumadin 5 mg daily      Review of Systems  Unable to perform ROS: other    Physical Exam  Constitutional: No distress.  Eyes: Conjunctivae are normal.  Neck: Neck supple. No JVD present. No thyromegaly present.  Cardiovascular: Normal rate, regular rhythm and intact distal pulses.   Respiratory: Effort normal and breath sounds normal. No respiratory distress. He has no wheezes.  Janina Mayo has been removed; site without signs of infection present  GI: Soft. Bowel sounds are normal. He exhibits no distension. There is no tenderness.  Has  peg tube  Musculoskeletal: He exhibits no edema.  Able to move all extremities  Has left side weakness   Lymphadenopathy:    He has no cervical adenopathy.  Neurological: He is alert.  Skin: Skin is warm and dry. He is not diaphoretic.  Abdominal wound: with serous drainage present wound bed pink; the peri-wound area is inflamed    Psychiatric: He has a normal mood and affect.       ASSESSMENT/ PLAN:  1. Abdominal wound cellulitis: will restart wound vac and will begin levaquin 500 mg daily for 14 days per surgery recommendation.     Synthia Innocent NP [facility] Adult Medicine  Contact [phone] Monday through Friday 8am- 5pm  After hours call [phone]

## 2016-05-13 DIAGNOSIS — L98493 Non-pressure chronic ulcer of skin of other sites with necrosis of muscle: Secondary | ICD-10-CM | POA: Diagnosis not present

## 2016-05-13 DIAGNOSIS — L259 Unspecified contact dermatitis, unspecified cause: Secondary | ICD-10-CM | POA: Diagnosis not present

## 2016-05-16 DIAGNOSIS — F419 Anxiety disorder, unspecified: Secondary | ICD-10-CM | POA: Diagnosis not present

## 2016-05-16 DIAGNOSIS — F329 Major depressive disorder, single episode, unspecified: Secondary | ICD-10-CM | POA: Diagnosis not present

## 2016-05-16 DIAGNOSIS — R4189 Other symptoms and signs involving cognitive functions and awareness: Secondary | ICD-10-CM | POA: Diagnosis not present

## 2016-05-20 ENCOUNTER — Non-Acute Institutional Stay (SKILLED_NURSING_FACILITY): Payer: Medicare Other | Admitting: Adult Health

## 2016-05-20 ENCOUNTER — Encounter: Payer: Self-pay | Admitting: Adult Health

## 2016-05-20 DIAGNOSIS — L259 Unspecified contact dermatitis, unspecified cause: Secondary | ICD-10-CM | POA: Diagnosis not present

## 2016-05-20 DIAGNOSIS — Z794 Long term (current) use of insulin: Secondary | ICD-10-CM | POA: Diagnosis not present

## 2016-05-20 DIAGNOSIS — L98493 Non-pressure chronic ulcer of skin of other sites with necrosis of muscle: Secondary | ICD-10-CM | POA: Diagnosis not present

## 2016-05-20 DIAGNOSIS — E119 Type 2 diabetes mellitus without complications: Secondary | ICD-10-CM

## 2016-05-20 LAB — PROTIME-INR
INR: 2.7 — AB (ref 0.9–1.1)
PT: 28.9

## 2016-05-20 NOTE — Progress Notes (Signed)
Patient ID: Curtis Keller, male   DOB: 03/30/1956, 60 y.o.   MRN: [id number]   Location:   [facility] Room Number: 115-A Place of Service:  SNF (31)   CODE STATUS: Full Code  Allergies  Allergen Reactions  . Codeine Anaphylaxis and Nausea And Vomiting  . Hydrocodone Other (See Comments)  . Hydrocodone-Acetaminophen Nausea And Vomiting  . Tegaderm Ag Mesh [Silver]   . Lisinopril Itching and Rash  . Tylenol [Acetaminophen] Nausea Only    Chief Complaint  Patient presents with  . Acute Visit    Diabetes    HPI:  I have been asked to review his diabetes. His hgb a1c is 5.2. There are no reports of hypoglycemia. He does require some insulin with his meals. He is unable to participate fully in the hpi or ros.    Past Medical History:  Diagnosis Date  . Acute respiratory failure (HCC)   . Apical mural thrombus (HCC)   . Cardiomyopathy (HCC)   . CHF (congestive heart failure) (HCC)   . Depression   . Hypertension   . Protein calorie malnutrition (HCC)   . Status post tracheostomy [facility])     Past Surgical History:  Procedure Laterality Date  . CARDIAC CATHETERIZATION    . CORONARY ANGIOPLASTY WITH STENT PLACEMENT    . PR LAP, SPLENECTOMY  01/07/2016  . TRACHEOSTOMY TUBE PLACEMENT N/A 02/22/2016   Procedure: TRACHEOSTOMY;  Surgeon: [name], MD;  Location: [facility] OR;  Service: ENT;  Laterality: N/A;    Social History   Social History  . Marital status: Single    Spouse name: N/A  . Number of children: N/A  . Years of education: N/A   Occupational History  . Not on file.   Social History Main Topics  . Smoking status: Current Every Day Smoker    Packs/day: 2.00    Years: 38.00    Types: Cigarettes  . Smokeless tobacco: Never Used  . Alcohol use No  . Drug use: No  . Sexual activity: Not on file   Other Topics Concern  . Not on file   Social History Narrative  . No narrative on file   Family History  Problem Relation Age of Onset    . Alcohol abuse Mother   . Hypertension Mother   . Coronary artery disease Mother   . Alcohol abuse Father   . Hypertension Father   . Coronary artery disease Father   . Alcohol abuse Maternal Uncle       VITAL SIGNS BP 108/77   Pulse 66   Temp 97.8 F (36.6 C) (Oral)   Resp (!) 22   Ht 6\' 3"  (1.905 m)   Wt 208 lb 8 oz (94.6 kg)   SpO2 97%   BMI 26.06 kg/m   Patient's Medications  New Prescriptions   No medications on file  Previous Medications   AMINO ACIDS-PROTEIN HYDROLYS (FEEDING SUPPLEMENT, PRO-STAT SUGAR FREE 64,) LIQD    Take 30 mLs by mouth 3 (three) times daily with meals. Pro Stat Awc 17-100 gram-kcal   ATORVASTATIN (LIPITOR) 40 MG TABLET    Take 40 mg by mouth at bedtime.   FAMOTIDINE (PEPCID) 20 MG TABLET    Take 20 mg by mouth 2 (two) times daily.   FLUOXETINE HCL 60 MG TABS    Take 1 tablet by mouth daily.   FUROSEMIDE (LASIX) 10 MG/ML SOLUTION    Take 20 mg by mouth daily. Into a venous catheter  IBUPROFEN (ADVIL,MOTRIN) 200 MG TABLET    Take 200 mg by mouth every 6 (six) hours as needed.   INSULIN LISPRO (HUMALOG) 100 UNIT/ML INJECTION    once. Use Sliding Scale every 6 hours ; for blood glucose below 70 give 0 un, low dose from 70 to 150 , give 0  Un, from 151 to 200 give 2 un, from 201 to 250 give 4 un, from 251 to 300 give 6 un, from 301 to 350 give 8 un, from 351 to 400 give 10 un, for blood glucose above 400, give 12 un call MD and obtain stat lab verification   IPRATROPIUM-ALBUTEROL (DUONEB) 0.5-2.5 (3) MG/3ML SOLN    Take 3 mLs by nebulization every 6 (six) hours as needed (via trach).    LORAZEPAM (ATIVAN) 0.5 MG TABLET    Take 0.5 mg by mouth every 8 (eight) hours as needed for anxiety.   MENTHOL, TOPICAL ANALGESIC, (BIOFREEZE EX)    Apply topically as needed.   METOPROLOL SUCCINATE (TOPROL-XL) 25 MG 24 HR TABLET    Take 25 mg by mouth 2 (two) times daily.   OMEGA-3 FATTY ACIDS (FISH OIL PO)    Take by mouth. Reported on 03/10/2016   OXYCODONE HCL,  ABUSE DETER, (OXAYDO) 5 MG TABA    Take by mouth. Give 10 mg by mouth every 6 hours prn severe pain. Give 5 mg every 4 hours prn moderate pain.   OXYGEN    Inhale 3 L into the lungs as needed. For SOB   PREGABALIN (LYRICA) 75 MG CAPSULE    Take 75 mg by mouth 2 (two) times daily.   SENNOSIDES (SENNA LAX PO)    2 tablets at bedtime. Take by mouth nightly    VITAMIN C (ASCORBIC ACID) 500 MG TABLET    Take 500 mg by mouth 2 (two) times daily. 10,22   WARFARIN (COUMADIN) 6 MG TABLET    Take 6 mg by mouth daily.   ZINC SULFATE 220 (50 ZN) MG CAPSULE    Take 220 mg by mouth daily. At 1800  Modified Medications   No medications on file  Discontinued Medications     SIGNIFICANT DIAGNOSTIC EXAMS  02-24-16: chest x-ray: 1. Tracheostomy tube tip in satisfactory position approximately 5 cm above the carina. 2. Significant reduction in pneumomediastinum and subcutaneous emphysema since the examination 2 days ago. 3. Improved interstitial pulmonary edema, though mild interstitial edema persists. 4. New bibasilar atelectasis and new dense atelectasis and/or pneumonia in the right lower lobe.  04-04-16: right hip and pelvic x-ray: modest osteoarthritis no pelvic or hip fracture   04-20-16: left knee x-ray; mild osteoarthritis   LABS REVIEWED:   02-06-16: hgb a1c 5.2 03-08-16: wbc 8.9; gb 12.8; hct 39.0; mcv 98.5 plt 329; glucose 196; bun 32; creat 0.99; k+ 3.6; na++ 135  03-15-16: wbc 9.7; hb 11.4; hct 35.9; mcv 88.2; plt 303; glucose 155; bun 18.1; creat 0.95; k+ 4.3; na++ 137 03-18-16: inr 2.3 03-22-16: inr 2.7: coumadin 5 mg daily  05-18-16: INR 2.7      Review of Systems  Unable to perform ROS: other    Physical Exam  Constitutional: No distress.  Eyes: Conjunctivae are normal.  Neck: Neck supple. No JVD present. No thyromegaly present.  Cardiovascular: Normal rate, regular rhythm and intact distal pulses.   Respiratory: Effort normal and breath sounds normal. No respiratory distress. He has no  wheezes.  GI: Soft. Bowel sounds are normal. He exhibits no distension. There is no tenderness.  Has peg tube  Musculoskeletal: He exhibits no edema.  Able to move all extremities  Has left side weakness   Lymphadenopathy:    He has no cervical adenopathy.  Neurological: He is alert.  Skin: Skin is warm and dry. He is not diaphoretic.  Abdominal wound: dressing dry and intact   Psychiatric: He has a normal mood and affect.       ASSESSMENT/ PLAN:  1. Diabetes: hgb a1c 5.2. Will change his humalog to 3 units after meals and will monitor his status  Will check lipids; urine for micro-albumin    MD is aware of resident's narcotic use and is in agreement with current plan of care. We will attempt to wean resident as apropriate   Synthia Innocent NP [facility] Adult Medicine  Contact [phone] Monday through Friday 8am- 5pm  After hours call [phone]

## 2016-05-27 DIAGNOSIS — L98493 Non-pressure chronic ulcer of skin of other sites with necrosis of muscle: Secondary | ICD-10-CM | POA: Diagnosis not present

## 2016-05-27 DIAGNOSIS — L259 Unspecified contact dermatitis, unspecified cause: Secondary | ICD-10-CM | POA: Diagnosis not present

## 2016-06-03 DIAGNOSIS — L98493 Non-pressure chronic ulcer of skin of other sites with necrosis of muscle: Secondary | ICD-10-CM | POA: Diagnosis not present

## 2016-06-03 DIAGNOSIS — L259 Unspecified contact dermatitis, unspecified cause: Secondary | ICD-10-CM | POA: Diagnosis not present

## 2016-06-10 DIAGNOSIS — L259 Unspecified contact dermatitis, unspecified cause: Secondary | ICD-10-CM | POA: Diagnosis not present

## 2016-06-10 DIAGNOSIS — L98493 Non-pressure chronic ulcer of skin of other sites with necrosis of muscle: Secondary | ICD-10-CM | POA: Diagnosis not present

## 2016-06-12 DIAGNOSIS — E119 Type 2 diabetes mellitus without complications: Secondary | ICD-10-CM | POA: Insufficient documentation

## 2016-06-12 DIAGNOSIS — Z794 Long term (current) use of insulin: Principal | ICD-10-CM

## 2016-06-17 ENCOUNTER — Non-Acute Institutional Stay (INDEPENDENT_AMBULATORY_CARE_PROVIDER_SITE_OTHER): Payer: Medicare Other | Admitting: Family Medicine

## 2016-06-17 DIAGNOSIS — Z930 Tracheostomy status: Secondary | ICD-10-CM

## 2016-06-17 NOTE — Progress Notes (Signed)
Note generated to remove patient from census of [facility] nursing home service

## 2016-06-21 LAB — PROTIME-INR: Protime: 25.3 seconds — AB (ref 10.0–13.8)

## 2016-06-21 LAB — POCT INR: INR: 2.3 — AB (ref 0.9–1.1)

## 2016-06-23 DIAGNOSIS — L22 Diaper dermatitis: Secondary | ICD-10-CM | POA: Diagnosis not present

## 2016-06-23 DIAGNOSIS — L98493 Non-pressure chronic ulcer of skin of other sites with necrosis of muscle: Secondary | ICD-10-CM | POA: Diagnosis not present

## 2016-06-29 ENCOUNTER — Encounter: Payer: Self-pay | Admitting: Adult Health

## 2016-06-29 ENCOUNTER — Non-Acute Institutional Stay (SKILLED_NURSING_FACILITY): Payer: Medicare Other | Admitting: Adult Health

## 2016-06-29 DIAGNOSIS — I513 Intracardiac thrombosis, not elsewhere classified: Secondary | ICD-10-CM

## 2016-06-29 DIAGNOSIS — I2109 ST elevation (STEMI) myocardial infarction involving other coronary artery of anterior wall: Secondary | ICD-10-CM

## 2016-06-29 DIAGNOSIS — I213 ST elevation (STEMI) myocardial infarction of unspecified site: Secondary | ICD-10-CM

## 2016-06-29 DIAGNOSIS — Y92129 Unspecified place in nursing home as the place of occurrence of the external cause: Secondary | ICD-10-CM

## 2016-06-29 DIAGNOSIS — W19XXXD Unspecified fall, subsequent encounter: Secondary | ICD-10-CM

## 2016-06-29 DIAGNOSIS — Z7901 Long term (current) use of anticoagulants: Secondary | ICD-10-CM | POA: Diagnosis not present

## 2016-06-29 NOTE — Progress Notes (Signed)
Patient ID: Lars MageClifton G Hedges, male   DOB: 1956/04/25, 60 y.o.   MRN: [id number]    Location:   [facility] Room Number: 124-B Place of Service:  SNF (31)   CODE STATUS: Full Code  Allergies  Allergen Reactions  . Codeine Anaphylaxis and Nausea And Vomiting  . Hydrocodone Other (See Comments)  . Hydrocodone-Acetaminophen Nausea And Vomiting  . Tegaderm Ag Mesh [Silver]   . Lisinopril Itching and Rash  . Tylenol [Acetaminophen] Nausea Only    Chief Complaint  Patient presents with  . Acute Visit    Fall    HPI:  He has had another fall without injury. He tells me that he feels weak. I am concerned about his falls; as he is long term coumadin therapy.  I am concerned about the risks of him continuing coumadin therapy with his falls. I have talked with Dr. Montez Moritaarter regarding his coumadin therapy. We will need to be certain that his thrombus as resolved before the coumadin therapy can be stopped.    Past Medical History:  Diagnosis Date  . Acute respiratory failure (HCC)   . Apical mural thrombus (HCC)   . Cardiomyopathy (HCC)   . CHF (congestive heart failure) (HCC)   . Depression   . Hypertension   . Protein calorie malnutrition (HCC)   . Status post tracheostomy [facility](HCC)     Past Surgical History:  Procedure Laterality Date  . CARDIAC CATHETERIZATION    . CORONARY ANGIOPLASTY WITH STENT PLACEMENT    . PR LAP, SPLENECTOMY  01/07/2016  . TRACHEOSTOMY TUBE PLACEMENT N/A 02/22/2016   Procedure: TRACHEOSTOMY;  Surgeon: Drema Halonhristopher E Newman, MD;  [facility];  Service: ENT;  Laterality: N/A;    Social History   Social History  . Marital status: Single    Spouse name: N/A  . Number of children: N/A  . Years of education: N/A   Occupational History  . Not on file.   Social History Main Topics  . Smoking status: Current Every Day Smoker    Packs/day: 2.00    Years: 38.00    Types: Cigarettes  . Smokeless tobacco: Never Used  . Alcohol use No  . Drug  use: No  . Sexual activity: Not on file   Other Topics Concern  . Not on file   Social History Narrative  . No narrative on file   Family History  Problem Relation Age of Onset  . Alcohol abuse Mother   . Hypertension Mother   . Coronary artery disease Mother   . Alcohol abuse Father   . Hypertension Father   . Coronary artery disease Father   . Alcohol abuse Maternal Uncle       VITAL SIGNS BP 123/75   Pulse 66   Temp 98.2 F (36.8 C) (Oral)   Resp 20   Ht 6\' 3"  (1.905 m)   Wt 210 lb (95.3 kg)   SpO2 96%   BMI 26.25 kg/m   Patient's Medications  New Prescriptions   No medications on file  Previous Medications   AMINO ACIDS-PROTEIN HYDROLYS (FEEDING SUPPLEMENT, PRO-STAT SUGAR FREE 64,) LIQD    Take 60 mLs by mouth 2 (two) times daily. Pro Stat Awc 17-100 gram-kcal    ATORVASTATIN (LIPITOR) 40 MG TABLET    Take 40 mg by mouth at bedtime.   FAMOTIDINE (PEPCID) 20 MG TABLET    Take 20 mg by mouth 2 (two) times daily.   FLUOXETINE HCL 60 MG TABS  Take 1 tablet by mouth daily.   IBUPROFEN (ADVIL,MOTRIN) 200 MG TABLET    Take 400 mg by mouth every 4 (four) hours as needed.    INSULIN LISPRO (HUMALOG KWIKPEN) 100 UNIT/ML KIWKPEN    Inject 5 Units into the skin every evening.   IPRATROPIUM-ALBUTEROL (DUONEB) 0.5-2.5 (3) MG/3ML SOLN    Take 3 mLs by nebulization every 6 (six) hours as needed (via trach).    LORAZEPAM (ATIVAN) 0.5 MG TABLET    Take 0.5 mg by mouth every 8 (eight) hours as needed for anxiety.   MENTHOL, TOPICAL ANALGESIC, (BIOFREEZE EX)    Apply topically as needed.   METOPROLOL SUCCINATE (TOPROL-XL) 25 MG 24 HR TABLET    Take 25 mg by mouth 2 (two) times daily.   OMEGA-3 FATTY ACIDS (FISH OIL PO)    Take by mouth. Reported on 03/10/2016   OXYCODONE HCL, ABUSE DETER, (OXAYDO) 5 MG TABA    Take by mouth. Give 10 mg by mouth every 6 hours prn severe pain. Give 5 mg every 4 hours prn moderate pain.   OXYCODONE HCL, ABUSE DETER, (OXAYDO) 5 MG TABA    Take 1 tablet  by mouth every 4 (four) hours as needed.   OXYGEN    Inhale 3 L into the lungs as needed. For SOB   PREGABALIN (LYRICA) 75 MG CAPSULE    Take 75 mg by mouth 2 (two) times daily.   SENNOSIDES (SENNA LAX PO)    2 tablets at bedtime. Take by mouth nightly    TORSEMIDE (DEMADEX) 10 MG TABLET    Take 10 mg by mouth daily.   VITAMIN C (ASCORBIC ACID) 500 MG TABLET    Take 500 mg by mouth 2 (two) times daily. 10,22   WARFARIN (COUMADIN) 5 MG TABLET    Take 5 mg by mouth daily. Every evening   ZINC SULFATE 220 (50 ZN) MG CAPSULE    Take 220 mg by mouth daily. At 1800  Modified Medications   No medications on file  Discontinued Medications   FAMOTIDINE (PEPCID) 20 MG TABLET    Take 20 mg by mouth 2 (two) times daily.   FUROSEMIDE (LASIX) 10 MG/ML SOLUTION    Take 20 mg by mouth daily. Into a venous catheter   INSULIN LISPRO (HUMALOG) 100 UNIT/ML INJECTION    once. Use Sliding Scale every 6 hours ; for blood glucose below 70 give 0 un, low dose from 70 to 150 , give 0  Un, from 151 to 200 give 2 un, from 201 to 250 give 4 un, from 251 to 300 give 6 un, from 301 to 350 give 8 un, from 351 to 400 give 10 un, for blood glucose above 400, give 12 un call MD and obtain stat lab verification   WARFARIN (COUMADIN) 6 MG TABLET    Take 6 mg by mouth daily.     SIGNIFICANT DIAGNOSTIC EXAMS  02-24-16: chest x-ray: 1. Tracheostomy tube tip in satisfactory position approximately 5 cm above the carina. 2. Significant reduction in pneumomediastinum and subcutaneous emphysema since the examination 2 days ago. 3. Improved interstitial pulmonary edema, though mild interstitial edema persists. 4. New bibasilar atelectasis and new dense atelectasis and/or pneumonia in the right lower lobe.  04-04-16: right hip and pelvic x-ray: modest osteoarthritis no pelvic or hip fracture   04-20-16: left knee x-ray; mild osteoarthritis   LABS REVIEWED:   02-06-16: hgb a1c 5.2 03-08-16: wbc 8.9; gb 12.8; hct 39.0; mcv 98.5 plt 329;  glucose 196; bun 32; creat 0.99; k+ 3.6; na++ 135  03-15-16: wbc 9.7; hb 11.4; hct 35.9; mcv 88.2; plt 303; glucose 155; bun 18.1; creat 0.95; k+ 4.3; na++ 137 03-18-16: inr 2.3 03-22-16: inr 2.7: coumadin 5 mg daily  05-18-16: INR 2.7  05-24-16: chol 105; ldl 40; trig 86; hdl 47 urine micro-albumin 9.7 06-21-16: INR 2.3      Review of Systems  Unable to perform ROS: other    Physical Exam  Constitutional: No distress.  Eyes: Conjunctivae are normal.  Neck: Neck supple. No JVD present. No thyromegaly present.  Cardiovascular: Normal rate, regular rhythm and intact distal pulses.   Respiratory: Effort normal and breath sounds normal. No respiratory distress. He has no wheezes.  GI: Soft. Bowel sounds are normal. He exhibits no distension. There is no tenderness.  Has peg tube  Musculoskeletal: He exhibits no edema.  Able to move all extremities  Has left side weakness   Lymphadenopathy:    He has no cervical adenopathy.  Neurological: He is alert.  Skin: Skin is warm and dry. He is not diaphoretic.  Abdominal wound: dressing dry and intact   Psychiatric: He has a normal mood and affect.       ASSESSMENT/ PLAN:  1. Apical mural thrombus 2. Fall  Will check cbc; cmp; hgb a1c Will setup an 2-d echo I have discussed his coumadin therapy with Dr. Montez Morita regarding the risks versus benefits considering his falls. At this time the benefits of his coumadin therapy outweigh the risks; at least until more information is obtained from the echo regarding the mural thrombus.     MD is aware of resident's narcotic use and is in agreement with current plan of care. We will attempt to wean resident as apropriate   Synthia Innocent NP [facility]  Contact [phone] Monday through Friday 8am- 5pm  After hours call [phone]

## 2016-07-01 DIAGNOSIS — L22 Diaper dermatitis: Secondary | ICD-10-CM | POA: Diagnosis not present

## 2016-07-01 DIAGNOSIS — F419 Anxiety disorder, unspecified: Secondary | ICD-10-CM | POA: Diagnosis not present

## 2016-07-01 DIAGNOSIS — F329 Major depressive disorder, single episode, unspecified: Secondary | ICD-10-CM | POA: Diagnosis not present

## 2016-07-01 DIAGNOSIS — L98492 Non-pressure chronic ulcer of skin of other sites with fat layer exposed: Secondary | ICD-10-CM | POA: Diagnosis not present

## 2016-07-01 DIAGNOSIS — R4189 Other symptoms and signs involving cognitive functions and awareness: Secondary | ICD-10-CM | POA: Diagnosis not present

## 2016-07-07 LAB — POCT INR: INR: 2.2 — AB (ref 0.9–1.1)

## 2016-07-07 LAB — PROTIME-INR: PROTIME: 24.3 s — AB (ref 10.0–13.8)

## 2016-07-08 DIAGNOSIS — L98499 Non-pressure chronic ulcer of skin of other sites with unspecified severity: Secondary | ICD-10-CM | POA: Diagnosis not present

## 2016-07-12 LAB — POCT INR: INR: 2.6 — AB (ref 0.9–1.1)

## 2016-07-12 LAB — PROTIME-INR: PROTIME: 27.8 s — AB (ref 10.0–13.8)

## 2016-07-13 ENCOUNTER — Other Ambulatory Visit: Payer: Self-pay

## 2016-07-13 LAB — PROTIME-INR: PROTIME: 28.5 s — AB (ref 10.0–13.8)

## 2016-07-13 LAB — POCT INR: INR: 2.7 — AB (ref 0.9–1.1)

## 2016-07-13 MED ORDER — OXYCODONE HCL 5 MG PO TABA: ORAL_TABLET | ORAL | 0 refills | Status: DC

## 2016-07-13 NOTE — Telephone Encounter (Signed)
RX faxed to [facility] @ [phone], phone number [phone] 

## 2016-07-17 DIAGNOSIS — W19XXXA Unspecified fall, initial encounter: Secondary | ICD-10-CM | POA: Insufficient documentation

## 2016-07-17 DIAGNOSIS — Y92129 Unspecified place in nursing home as the place of occurrence of the external cause: Secondary | ICD-10-CM

## 2016-07-18 ENCOUNTER — Non-Acute Institutional Stay (SKILLED_NURSING_FACILITY): Payer: Medicare Other | Admitting: Internal Medicine

## 2016-07-18 ENCOUNTER — Encounter: Payer: Self-pay | Admitting: Internal Medicine

## 2016-07-18 DIAGNOSIS — I2109 ST elevation (STEMI) myocardial infarction involving other coronary artery of anterior wall: Secondary | ICD-10-CM

## 2016-07-18 DIAGNOSIS — Z7901 Long term (current) use of anticoagulants: Secondary | ICD-10-CM | POA: Diagnosis not present

## 2016-07-18 DIAGNOSIS — R2231 Localized swelling, mass and lump, right upper limb: Secondary | ICD-10-CM | POA: Diagnosis not present

## 2016-07-18 DIAGNOSIS — I513 Intracardiac thrombosis, not elsewhere classified: Secondary | ICD-10-CM

## 2016-07-18 DIAGNOSIS — I213 ST elevation (STEMI) myocardial infarction of unspecified site: Secondary | ICD-10-CM | POA: Diagnosis not present

## 2016-07-18 DIAGNOSIS — K9422 Gastrostomy infection: Secondary | ICD-10-CM | POA: Diagnosis not present

## 2016-07-18 NOTE — Progress Notes (Signed)
Patient ID: FREEDOM HUR, male   DOB: [birth year], 60 y.o.   MRN: [id number]    DATE: 07/18/2016  Location:    [facility] Room Number: 124 B Place of Service: SNF (31)   Extended Emergency Contact Information Primary Emergency Contact: Wynell Balloon [place] Home Phone: [phone] Mobile Phone: [phone] Relation: Sister Secondary Emergency Contact: Neita Carp [place] Mobile Phone: [phone] Relation: Daughter  Advanced Directive information Does patient have an advance directive?: Yes, Does patient want to make changes to advanced directive?: No - Patient declined  Chief Complaint  Patient presents with  . Acute Visit    multiple concerns    HPI:  60 yo male seen today for possible right axillary abscess and to check Peg site per nursing. Pt c/o 1 week hx right forearm pain. No d/c or f/c. Peg site with d/c per nursing. He no onger gets TF. Hx apical cardiac mural thrombus on chronic coumadin. CBG 102  Diastolic heart failure- EF 55-60% (07-06-16) - stable on demadex 10 mg daily   Hypertension - stable on toprol xl 25 mg twice daily   Dyslipidemia - stable on lipitor 40 mg daily; fish oil 1 mg daily. LDL 40   GERD - stable on pepcid 20 mg twice daily   Constipation - stable on senna 2 tabs nightly   COPD - stable on douneb every 6 hours as needed  Dysphagia- stable with no signs of aspiration; awaiting his peg tube removal  Depression with anxiety - stable on prozac 60 mg daily buspar 10 mg twice daily has ativan 0.5 mg every 8 hours as needed  Chronic pain- stable on lyrica 75 mg twice daily and has oxycodone 5 mg every  4 hours as needed   Past Medical History:  Diagnosis Date  . Acute respiratory failure (HCC)   . Apical mural thrombus (HCC)   . Cardiomyopathy (HCC)   . CHF (congestive heart failure) (HCC)   . Depression   . Hypertension   . Protein calorie malnutrition (HCC)   . Status  post tracheostomy [facility])     Past Surgical History:  Procedure Laterality Date  . CARDIAC CATHETERIZATION    . CORONARY ANGIOPLASTY WITH STENT PLACEMENT    . PR LAP, SPLENECTOMY  01/07/2016  . TRACHEOSTOMY TUBE PLACEMENT N/A 02/22/2016   Procedure: TRACHEOSTOMY;  Surgeon: [name], MD;  Location: [facility] OR;  Service: ENT;  Laterality: N/A;    Patient Care Team: Kirt Boys, DO as PCP - General (Internal Medicine)  Social History   Social History  . Marital status: Single    Spouse name: N/A  . Number of children: N/A  . Years of education: N/A   Occupational History  . Not on file.   Social History Main Topics  . Smoking status: Current Every Day Smoker    Packs/day: 2.00    Years: 38.00    Types: Cigarettes  . Smokeless tobacco: Never Used  . Alcohol use No  . Drug use: No  . Sexual activity: Not on file   Other Topics Concern  . Not on file   Social History Narrative  . No narrative on file     reports that he has been smoking Cigarettes.  He has a 76.00 pack-year smoking history. He has never used smokeless tobacco. He reports that he does not drink alcohol or use drugs.  Family History  Problem Relation Age of Onset  . Alcohol abuse Mother   .  Hypertension Mother   . Coronary artery disease Mother   . Alcohol abuse Father   . Hypertension Father   . Coronary artery disease Father   . Alcohol abuse Maternal Uncle    Family Status  Relation Status  . Mother   . Father   . Maternal Uncle     Immunization History  Administered Date(s) Administered  . PPD Test 07/01/2016, 07/08/2016    Allergies  Allergen Reactions  . Codeine Anaphylaxis and Nausea And Vomiting  . Hydrocodone Other (See Comments)  . Hydrocodone-Acetaminophen Nausea And Vomiting  . Tegaderm Ag Mesh [Silver]   . Lisinopril Itching and Rash  . Tylenol [Acetaminophen] Nausea Only    Medications: Patient's Medications  New Prescriptions   No medications on file    Previous Medications   AMINO ACIDS-PROTEIN HYDROLYS (FEEDING SUPPLEMENT, PRO-STAT SUGAR FREE 64,) LIQD    Take 60 mLs by mouth 2 (two) times daily. Pro Stat Awc 17-100 gram-kcal    ATORVASTATIN (LIPITOR) 40 MG TABLET    Take 40 mg by mouth at bedtime.   BUSPIRONE (BUSPAR) 10 MG TABLET    Take 10 mg by mouth 2 (two) times daily.   FAMOTIDINE (PEPCID) 20 MG TABLET    Take 20 mg by mouth 2 (two) times daily.   FLUOXETINE HCL 60 MG TABS    Take 1 tablet by mouth daily.   IBUPROFEN (ADVIL,MOTRIN) 200 MG TABLET    Take 400 mg by mouth every 4 (four) hours as needed.    INSULIN LISPRO (HUMALOG KWIKPEN) 100 UNIT/ML KIWKPEN    Inject 5 Units into the skin every evening.   IPRATROPIUM-ALBUTEROL (DUONEB) 0.5-2.5 (3) MG/3ML SOLN    Take 3 mLs by nebulization every 6 (six) hours as needed (via trach).    LORAZEPAM (ATIVAN) 0.5 MG TABLET    Take 0.5 mg by mouth every 8 (eight) hours as needed for anxiety.   MENTHOL, TOPICAL ANALGESIC, (BIOFREEZE EX)    Apply topically as needed.   METOPROLOL SUCCINATE (TOPROL-XL) 25 MG 24 HR TABLET    Take 25 mg by mouth 2 (two) times daily.   OMEGA-3 FATTY ACIDS (FISH OIL PO)    Take by mouth. Reported on 03/10/2016   OXYCODONE HCL, ABUSE DETER, (OXAYDO) 5 MG TABA    Take by mouth. Give 10 mg by mouth every 6 hours prn severe pain. Give 5 mg every 4 hours prn moderate pain.   OXYCODONE HCL, ABUSE DETER, (OXAYDO) 5 MG TABA    1 tablet by mouth every 4 hours as needed for pan, 2 by mouth every 6 hours as needed for pain   OXYGEN    Inhale 3 L into the lungs as needed. For SOB   PREGABALIN (LYRICA) 75 MG CAPSULE    Take 75 mg by mouth 2 (two) times daily.   SENNOSIDES (SENNA LAX PO)    2 tablets at bedtime. Take by mouth nightly    TORSEMIDE (DEMADEX) 10 MG TABLET    Take 10 mg by mouth daily.   VITAMIN C (ASCORBIC ACID) 500 MG TABLET    Take 500 mg by mouth 2 (two) times daily. 10,22   WARFARIN (COUMADIN) 5 MG TABLET    Take 5 mg by mouth daily. Every evening   ZINC SULFATE  220 (50 ZN) MG CAPSULE    Take 220 mg by mouth daily. At 1800  Modified Medications   No medications on file  Discontinued Medications   No medications on file  Review of Systems  Musculoskeletal: Positive for arthralgias.  Skin: Positive for rash and wound.  All other systems reviewed and are negative.   Vitals:   07/18/16 1114  BP: 140/80  Pulse: 64  Resp: 18  Temp: 98.2 F (36.8 C)  TempSrc: Oral  SpO2: 91%  Weight: 211 lb 9.6 oz (96 kg)  Height: 6\' 3"  (1.905 m)   Body mass index is 26.45 kg/m.  Physical Exam  Constitutional: He is oriented to person, place, and time. He appears well-developed.  Frail appearing lying in bed in nAD  Abdominal: Soft. Bowel sounds are normal. He exhibits no distension and no mass. There is tenderness. There is no rebound and no guarding.  Peg site with increased redness at insertion site and foul smelling d/c  Neurological: He is alert and oriented to person, place, and time.  Skin: Skin is warm and dry. Rash noted.  Right lateral forearm with red palpable TTP lump, no d/c but (+) streaking present. Peg site with redness at insertion and foul smelling d/c present  Psychiatric: He has a normal mood and affect. His behavior is normal. Thought content normal.     Labs reviewed: Nursing Home on 07/18/2016  Component Date Value Ref Range Status  . INR 07/12/2016 2.6* 0.9 - 1.1 Final  . Protime 07/12/2016 27.8* 10.0 - 13.8 seconds Final  . INR 07/13/2016 2.7* 0.9 - 1.1 Final  . Protime 07/13/2016 28.5* 10.0 - 13.8 seconds Final  . INR 07/07/2016 2.2* 0.9 - 1.1 Final  . Protime 07/07/2016 24.3* 10.0 - 13.8 seconds Final  Nursing Home on 06/29/2016  Component Date Value Ref Range Status  . INR 06/21/2016 2.3* 0.9 - 1.1 Final  . Protime 06/21/2016 25.3* 10.0 - 13.8 seconds Final  Nursing Home on 05/20/2016  Component Date Value Ref Range Status  . INR 05/20/2016 2.7* 0.9 - 1.1 Final  . PT 05/20/2016 28.9   Final  Nursing Home on  04/18/2016  Component Date Value Ref Range Status  . INR 03/15/2016 4.9* 0.9 - 1.1 Final  . Hemoglobin 03/15/2016 11.4* 13.5 - 17.5 g/dL Final  . HCT 16/07/9603 36* 41 - 53 % Final  . Platelets 03/15/2016 303  150 - 399 K/L Final  . WBC 03/15/2016 9.7  10^3/mL Final  . Protime 03/15/2016 45.9* 10.0 - 13.8 seconds Final  . Glucose 03/15/2016 155  mg/dL Final  . BUN 54/06/8118 18  4 - 21 mg/dL Final  . Creatinine 14/78/2956 1.0  0.6 - 1.3 mg/dL Final  . Potassium 21/30/8657 4.3  3.4 - 5.3 mmol/L Final  . Sodium 03/15/2016 137  137 - 147 mmol/L Final  . INR 03/18/2016 2.3* 0.9 - 1.1 Final  . Protime 03/18/2016 25.5* 10.0 - 13.8 seconds Final  . INR 03/16/2016 4.5* 0.9 - 1.1 Final  . Protime 03/16/2016 43.1* 10.0 - 13.8 seconds Final    No results found.   Assessment/Plan   ICD-9-CM ICD-10-CM   1. Infection of PEG site (HCC) 536.41 K94.22   2. Lump of skin of right upper extremity 782.2 R22.31    painful  3. Apical mural thrombus 410.10 I21.3   4. Chronic anticoagulation V58.61 Z79.01    Consult IR for Peg tube removal  Start Doxy 100mg  po BID x 7 days; take floraster bid x 3 weeks  Check CBC w diff and BMP  Cont other meds as ordered  Wound care as ordered  PT/OT as ordered  Follow INRs  Will follow  Mohd. Derflinger S.  Perlie Gold  [facility] [address] [place], [place] [address] [phone] Cell (Monday-Friday 8 AM - 5 PM) [phone] After 5 PM and follow prompts

## 2016-07-19 ENCOUNTER — Encounter: Payer: Self-pay | Admitting: Adult Health

## 2016-07-19 ENCOUNTER — Non-Acute Institutional Stay (SKILLED_NURSING_FACILITY): Payer: Medicare Other | Admitting: Adult Health

## 2016-07-19 DIAGNOSIS — I10 Essential (primary) hypertension: Secondary | ICD-10-CM

## 2016-07-19 DIAGNOSIS — I513 Intracardiac thrombosis, not elsewhere classified: Secondary | ICD-10-CM

## 2016-07-19 DIAGNOSIS — J449 Chronic obstructive pulmonary disease, unspecified: Secondary | ICD-10-CM

## 2016-07-19 DIAGNOSIS — I213 ST elevation (STEMI) myocardial infarction of unspecified site: Secondary | ICD-10-CM | POA: Diagnosis not present

## 2016-07-19 DIAGNOSIS — I5022 Chronic systolic (congestive) heart failure: Secondary | ICD-10-CM

## 2016-07-19 DIAGNOSIS — I2109 ST elevation (STEMI) myocardial infarction involving other coronary artery of anterior wall: Secondary | ICD-10-CM | POA: Diagnosis not present

## 2016-07-19 DIAGNOSIS — E785 Hyperlipidemia, unspecified: Secondary | ICD-10-CM

## 2016-07-19 DIAGNOSIS — F329 Major depressive disorder, single episode, unspecified: Secondary | ICD-10-CM

## 2016-07-19 DIAGNOSIS — F32A Depression, unspecified: Secondary | ICD-10-CM

## 2016-07-19 DIAGNOSIS — I69391 Dysphagia following cerebral infarction: Secondary | ICD-10-CM | POA: Diagnosis not present

## 2016-07-19 NOTE — Progress Notes (Signed)
Patient ID: Curtis Keller, male   DOB: 09-Dec-1955, 60 y.o.   MRN: [id number]   Location:   [facility] Room Number: 124-B Place of Service:  SNF (31)   CODE STATUS: Full Code  Allergies  Allergen Reactions  . Codeine Anaphylaxis and Nausea And Vomiting  . Hydrocodone Other (See Comments)  . Hydrocodone-Acetaminophen Nausea And Vomiting  . Tegaderm Ag Mesh [Silver]   . Lisinopril Itching and Rash  . Tylenol [Acetaminophen] Nausea Only    Chief Complaint  Patient presents with  . Medical Management of Chronic Issues    Follow up    HPI:  He is a long term resident of this facilty being seen for the management of his chronic illnesses. Overall his status is presently stable. He had a 2-d echo done to further evaluate his mural thrombus; unfortunately there is no direct reference to this; the nursing staff will have the films reread to evaluate this. He is on chronic coumadin therapy; but has had several falls in the past weeks.    Past Medical History:  Diagnosis Date  . Acute respiratory failure (HCC)   . Apical mural thrombus (HCC)   . Cardiomyopathy (HCC)   . CHF (congestive heart failure) (HCC)   . Depression   . Hypertension   . Protein calorie malnutrition (HCC)   . Status post tracheostomy [facility](HCC)     Past Surgical History:  Procedure Laterality Date  . CARDIAC CATHETERIZATION    . CORONARY ANGIOPLASTY WITH STENT PLACEMENT    . PR LAP, SPLENECTOMY  01/07/2016  . TRACHEOSTOMY TUBE PLACEMENT N/A 02/22/2016   Procedure: TRACHEOSTOMY;  Surgeon: Drema Halonhristopher E Newman, MD;  [facility];  Service: ENT;  Laterality: N/A;    Social History   Social History  . Marital status: Single    Spouse name: N/A  . Number of children: N/A  . Years of education: N/A   Occupational History  . Not on file.   Social History Main Topics  . Smoking status: Current Every Day Smoker    Packs/day: 2.00    Years: 38.00    Types: Cigarettes  . Smokeless tobacco:  Never Used  . Alcohol use No  . Drug use: No  . Sexual activity: Not on file   Other Topics Concern  . Not on file   Social History Narrative  . No narrative on file   Family History  Problem Relation Age of Onset  . Alcohol abuse Mother   . Hypertension Mother   . Coronary artery disease Mother   . Alcohol abuse Father   . Hypertension Father   . Coronary artery disease Father   . Alcohol abuse Maternal Uncle       VITAL SIGNS BP 140/80   Pulse 64   Temp 98.2 F (36.8 C) (Oral)   Resp 18   Ht 6\' 3"  (1.905 m)   Wt 211 lb 6 oz (95.9 kg)   SpO2 91%   BMI 26.42 kg/m   Patient's Medications  New Prescriptions   No medications on file  Previous Medications   AMINO ACIDS-PROTEIN HYDROLYS (FEEDING SUPPLEMENT, PRO-STAT SUGAR FREE 64,) LIQD    Take 60 mLs by mouth 2 (two) times daily. Pro Stat Awc 17-100 gram-kcal    ATORVASTATIN (LIPITOR) 40 MG TABLET    Take 40 mg by mouth at bedtime.   BUSPIRONE (BUSPAR) 10 MG TABLET    Take 10 mg by mouth 2 (two) times daily.   FAMOTIDINE (PEPCID)  20 MG TABLET    Take 20 mg by mouth 2 (two) times daily.   FLUOXETINE HCL 60 MG TABS    Take 1 tablet by mouth daily.   IBUPROFEN (ADVIL,MOTRIN) 200 MG TABLET    Take 400 mg by mouth every 4 (four) hours as needed.    INSULIN LISPRO (HUMALOG KWIKPEN) 100 UNIT/ML KIWKPEN    Inject 5 Units into the skin every evening.   IPRATROPIUM-ALBUTEROL (DUONEB) 0.5-2.5 (3) MG/3ML SOLN    Take 3 mLs by nebulization every 6 (six) hours as needed (via trach).    LORAZEPAM (ATIVAN) 0.5 MG TABLET    Take 0.5 mg by mouth every 8 (eight) hours as needed for anxiety.   MENTHOL, TOPICAL ANALGESIC, (BIOFREEZE EX)    Apply topically as needed.   METOPROLOL SUCCINATE (TOPROL-XL) 25 MG 24 HR TABLET    Take 25 mg by mouth 2 (two) times daily.   OMEGA-3 FATTY ACIDS (FISH OIL PO)    Take by mouth. Reported on 03/10/2016   OXYCODONE HCL, ABUSE DETER, (OXAYDO) 5 MG TABA    Take by mouth. Give 10 mg by mouth every 6 hours prn  severe pain. Give 5 mg every 4 hours prn moderate pain.   OXYCODONE HCL, ABUSE DETER, (OXAYDO) 5 MG TABA    1 tablet by mouth every 4 hours as needed for pan, 2 by mouth every 6 hours as needed for pain   OXYGEN    Inhale 3 L into the lungs as needed. For SOB   PREGABALIN (LYRICA) 75 MG CAPSULE    Take 75 mg by mouth 2 (two) times daily.   SENNOSIDES (SENNA LAX PO)    2 tablets at bedtime. Take by mouth nightly    TORSEMIDE (DEMADEX) 10 MG TABLET    Take 10 mg by mouth daily.   VITAMIN C (ASCORBIC ACID) 500 MG TABLET    Take 500 mg by mouth 2 (two) times daily. 10,22   WARFARIN (COUMADIN) 5 MG TABLET    Take 5 mg by mouth daily. Every evening   ZINC SULFATE 220 (50 ZN) MG CAPSULE    Take 220 mg by mouth daily. At 1800  Modified Medications   No medications on file  Discontinued Medications   No medications on file     SIGNIFICANT DIAGNOSTIC EXAMS  02-24-16: chest x-ray: 1. Tracheostomy tube tip in satisfactory position approximately 5 cm above the carina. 2. Significant reduction in pneumomediastinum and subcutaneous emphysema since the examination 2 days ago. 3. Improved interstitial pulmonary edema, though mild interstitial edema persists. 4. New bibasilar atelectasis and new dense atelectasis and/or pneumonia in the right lower lobe.  04-04-16: right hip and pelvic x-ray: modest osteoarthritis no pelvic or hip fracture   04-20-16: left knee x-ray; mild osteoarthritis   07-06-16: 2-d echo: 1. technically difficult; 2. Normal left ventricular systolic function EF 55-60%; 3. Mild relaxation abnormality 4. Mild concentric left ventricular hypertrophy 5. Mild sclerotic aortic valve no significant stenosis 6. Trace tricuspid regurgitation   07-11-16: chest x-ray; no acute cardiopulmonary process   LABS REVIEWED:   02-06-16: hgb a1c 5.2 03-08-16: wbc 8.9; gb 12.8; hct 39.0; mcv 98.5 plt 329; glucose 196; bun 32; creat 0.99; k+ 3.6; na++ 135  03-15-16: wbc 9.7; hb 11.4; hct 35.9; mcv 88.2; plt  303; glucose 155; bun 18.1; creat 0.95; k+ 4.3; na++ 137 03-18-16: inr 2.3 03-22-16: inr 2.7: coumadin 5 mg daily  05-18-16: INR 2.7  05-24-16: chol 105; ldl 40; trig 86;  hdl 47 urine micro-albumin 9.7 06-21-16: INR 2.3   07-19-16: wbc 8.5; hgb 12.5; hct 40.4; mcv 98.8; plt 233; glucose 70; bun 29.5; creat 1.27; k+ 4.2; na++ 141; INR 2.9    Review of Systems  Unable to perform ROS: other    Physical Exam  Constitutional: No distress.  Eyes: Conjunctivae are normal.  Neck: Neck supple. No JVD present. No thyromegaly present.  Cardiovascular: Normal rate, regular rhythm and intact distal pulses.   Respiratory: Effort normal and breath sounds normal. No respiratory distress. He has no wheezes.  GI: Soft. Bowel sounds are normal. He exhibits no distension. There is no tenderness.  Has peg tube  Musculoskeletal: He exhibits no edema.  Able to move all extremities  Has left side weakness   Lymphadenopathy:    He has no cervical adenopathy.  Neurological: He is alert.  Skin: Skin is warm and dry. He is not diaphoretic.  Psychiatric: He has a normal mood and affect.       ASSESSMENT/ PLAN:  1. Apical mural thrombus: is presently stable; is on chronic coumadin therapy   2. Diastolic heart failure: EF 55-60% (90-13-17) will continue demadex 10 mg daily   3. Hypertension: will continue toprol xl 25 mg twice daily   4. Dyslipidemia: will continue lipitor 40 mg daily fish oil 1 mg daily  ldl 40   5. Gerd: will continue pepcid 20 mg twice daily   6. Constipation: will continue senna 2 tabs nightly   7. COPD: will continue douneb every 6 hours as needed  8. Dysphagia: no signs of aspiration present; will not make changes; is awaiting his peg tube removal  9. Depression with anxiety: will continue prozac 60 mg daily buspar 10 mg twice daily has ativan 0.5 mg every 8 hours as needed  10.  Chronic pain; will continue lyrica 75 mg twice daily and has oxycodone 5 mg every  4 hours as  needed      MD is aware of resident's narcotic use and is in agreement with current plan of care. We will attempt to wean resident as apropriate   Synthia Innocent NP [facility]  Contact [phone] Monday through Friday 8am- 5pm  After hours call [phone]

## 2016-07-20 ENCOUNTER — Other Ambulatory Visit: Payer: Self-pay | Admitting: Internal Medicine

## 2016-07-20 DIAGNOSIS — R1310 Dysphagia, unspecified: Secondary | ICD-10-CM

## 2016-07-22 ENCOUNTER — Encounter (HOSPITAL_COMMUNITY): Payer: Self-pay | Admitting: Interventional Radiology

## 2016-07-22 ENCOUNTER — Ambulatory Visit (HOSPITAL_COMMUNITY)
Admission: RE | Admit: 2016-07-22 | Discharge: 2016-07-22 | Disposition: A | Discharge status: Home / Self Care | Payer: Medicare Other | Source: Ambulatory Visit | Attending: Internal Medicine | Admitting: Internal Medicine

## 2016-07-22 DIAGNOSIS — Z431 Encounter for attention to gastrostomy: Secondary | ICD-10-CM | POA: Diagnosis not present

## 2016-07-22 DIAGNOSIS — R1310 Dysphagia, unspecified: Secondary | ICD-10-CM

## 2016-07-22 DIAGNOSIS — Z4682 Encounter for fitting and adjustment of non-vascular catheter: Secondary | ICD-10-CM | POA: Diagnosis not present

## 2016-07-22 HISTORY — PX: IR GENERIC HISTORICAL: IMG1180011

## 2016-07-22 MED ORDER — LIDOCAINE VISCOUS 2 % MT SOLN
OROMUCOSAL | Status: DC | PRN
  Administered 2016-07-22: 3 mL via OROMUCOSAL

## 2016-07-22 MED ORDER — LIDOCAINE VISCOUS 2 % MT SOLN
OROMUCOSAL | Status: AC
  Filled 2016-07-22: qty 15

## 2016-07-22 NOTE — Procedures (Signed)
Successful bedside removal of intact pull through G-tube. EBL: None No immediate post procedural complications.  Jay Julie-Anne Torain, MD Pager #: [phone]   

## 2016-07-28 LAB — POCT INR: INR: 2.3 — AB (ref 0.9–1.1)

## 2016-07-28 LAB — PROTIME-INR: Protime: 25.4 seconds — AB (ref 10.0–13.8)

## 2016-08-12 ENCOUNTER — Non-Acute Institutional Stay (SKILLED_NURSING_FACILITY): Payer: Medicare Other | Admitting: Adult Health

## 2016-08-12 ENCOUNTER — Encounter: Payer: Self-pay | Admitting: Adult Health

## 2016-08-12 DIAGNOSIS — E782 Mixed hyperlipidemia: Secondary | ICD-10-CM

## 2016-08-12 DIAGNOSIS — Z794 Long term (current) use of insulin: Secondary | ICD-10-CM | POA: Diagnosis not present

## 2016-08-12 DIAGNOSIS — I5022 Chronic systolic (congestive) heart failure: Secondary | ICD-10-CM | POA: Diagnosis not present

## 2016-08-12 DIAGNOSIS — A4102 Sepsis due to Methicillin resistant Staphylococcus aureus: Secondary | ICD-10-CM

## 2016-08-12 DIAGNOSIS — Z7901 Long term (current) use of anticoagulants: Secondary | ICD-10-CM | POA: Diagnosis not present

## 2016-08-12 DIAGNOSIS — I2109 ST elevation (STEMI) myocardial infarction involving other coronary artery of anterior wall: Secondary | ICD-10-CM | POA: Diagnosis not present

## 2016-08-12 DIAGNOSIS — J449 Chronic obstructive pulmonary disease, unspecified: Secondary | ICD-10-CM | POA: Diagnosis not present

## 2016-08-12 DIAGNOSIS — E119 Type 2 diabetes mellitus without complications: Secondary | ICD-10-CM | POA: Diagnosis not present

## 2016-08-12 DIAGNOSIS — I513 Intracardiac thrombosis, not elsewhere classified: Secondary | ICD-10-CM | POA: Diagnosis not present

## 2016-08-12 DIAGNOSIS — I69391 Dysphagia following cerebral infarction: Secondary | ICD-10-CM | POA: Diagnosis not present

## 2016-08-12 NOTE — Progress Notes (Addendum)
Patient ID: Curtis Keller, male   DOB: 1956/03/28, 60 y.o.   MRN: [id number]   Location:   [facility] Room Number: 124-B Place of Service:  SNF (31)   CODE STATUS: Full Code  Allergies  Allergen Reactions  . Codeine Anaphylaxis and Nausea And Vomiting  . Hydrocodone Other (See Comments)  . Hydrocodone-Acetaminophen Nausea And Vomiting  . Tegaderm Ag Mesh [Silver]   . Lisinopril Itching and Rash  . Tylenol [Acetaminophen] Nausea Only    Chief Complaint  Patient presents with  . Medical Management of Chronic Issues    Full Code    HPI:  He is a long term resident of this facility being seen for the management of his chronic illnesses. He had a 2-d echo done to determine if his apical thrombus has resolved; however; they were unable to determine if the clot has dissolved. He is not voicing any complaints today stating that he is feeling good. There are no nursing concerns at this time.   Past Medical History:  Diagnosis Date  . Acute respiratory failure (HCC)   . Apical mural thrombus   . Cardiomyopathy (HCC)   . CHF (congestive heart failure) (HCC)   . Depression   . Hypertension   . Protein calorie malnutrition (HCC)   . Status post tracheostomy [facility](HCC)     Past Surgical History:  Procedure Laterality Date  . CARDIAC CATHETERIZATION    . CORONARY ANGIOPLASTY WITH STENT PLACEMENT    . IR GENERIC HISTORICAL  07/22/2016   IR GASTROSTOMY TUBE REMOVAL 07/22/2016 Simonne ComeJohn Watts, MD [facility]-INTERV RAD  . PR LAP, SPLENECTOMY  01/07/2016  . TRACHEOSTOMY TUBE PLACEMENT N/A 02/22/2016   Procedure: TRACHEOSTOMY;  Surgeon: Drema Halonhristopher E Newman, MD;  [facility];  Service: ENT;  Laterality: N/A;    Social History   Social History  . Marital status: Single    Spouse name: N/A  . Number of children: N/A  . Years of education: N/A   Occupational History  . Not on file.   Social History Main Topics  . Smoking status: Current Every Day Smoker    Packs/day: 2.00   Years: 38.00    Types: Cigarettes  . Smokeless tobacco: Never Used  . Alcohol use No  . Drug use: No  . Sexual activity: Not on file   Other Topics Concern  . Not on file   Social History Narrative  . No narrative on file   Family History  Problem Relation Age of Onset  . Alcohol abuse Mother   . Hypertension Mother   . Coronary artery disease Mother   . Alcohol abuse Father   . Hypertension Father   . Coronary artery disease Father   . Alcohol abuse Maternal Uncle       VITAL SIGNS BP (!) 139/58   Pulse (!) 56   Temp 97.5 F (36.4 C) (Oral)   Resp 18   Ht 6\' 3"  (1.905 m)   Wt 225 lb 8 oz (102.3 kg)   SpO2 95%   BMI 28.19 kg/m   Patient's Medications  New Prescriptions   No medications on file  Previous Medications   AMINO ACIDS-PROTEIN HYDROLYS (FEEDING SUPPLEMENT, PRO-STAT SUGAR FREE 64,) LIQD    Take 60 mLs by mouth 2 (two) times daily. Pro Stat Awc 17-100 gram-kcal    ATORVASTATIN (LIPITOR) 40 MG TABLET    Take 40 mg by mouth at bedtime.   BUSPIRONE (BUSPAR) 10 MG TABLET    Take 10  mg by mouth 2 (two) times daily.   FAMOTIDINE (PEPCID) 20 MG TABLET    Take 20 mg by mouth 2 (two) times daily.   FLUOXETINE HCL 60 MG TABS    Take 1 tablet by mouth daily.   IBUPROFEN (ADVIL,MOTRIN) 200 MG TABLET    Take 400 mg by mouth every 4 (four) hours as needed.    INSULIN LISPRO (HUMALOG KWIKPEN) 100 UNIT/ML KIWKPEN    Inject 5 Units into the skin every evening.   IPRATROPIUM-ALBUTEROL (DUONEB) 0.5-2.5 (3) MG/3ML SOLN    Take 3 mLs by nebulization every 6 (six) hours as needed (via trach).    LORAZEPAM (ATIVAN) 0.5 MG TABLET    Take 0.5 mg by mouth every 8 (eight) hours as needed for anxiety.   MENTHOL, TOPICAL ANALGESIC, (BIOFREEZE EX)    Apply topically as needed.   METOPROLOL SUCCINATE (TOPROL-XL) 25 MG 24 HR TABLET    Take 25 mg by mouth 2 (two) times daily.   OMEGA-3 FATTY ACIDS (FISH OIL PO)    Take by mouth. Reported on 03/10/2016   OXYCODONE HCL, ABUSE DETER,  (OXAYDO) 5 MG TABA    Take by mouth. Give 10 mg by mouth every 6 hours prn severe pain. Give 5 mg every 4 hours prn moderate pain.   OXYCODONE HCL, ABUSE DETER, (OXAYDO) 5 MG TABA    1 tablet by mouth every 4 hours as needed for pan, 2 by mouth every 6 hours as needed for pain   OXYGEN    Inhale 3 L into the lungs as needed. For SOB   PREGABALIN (LYRICA) 75 MG CAPSULE    Take 75 mg by mouth 2 (two) times daily.   SENNOSIDES (SENNA LAX PO)    2 tablets at bedtime. Take by mouth nightly    TORSEMIDE (DEMADEX) 10 MG TABLET    Take 10 mg by mouth daily.   VITAMIN C (ASCORBIC ACID) 500 MG TABLET    Take 500 mg by mouth 2 (two) times daily. 10,22   WARFARIN (COUMADIN) 5 MG TABLET    Take 5 mg by mouth daily. Every evening   ZINC SULFATE 220 (50 ZN) MG CAPSULE    Take 220 mg by mouth daily. At 1800  Modified Medications   No medications on file  Discontinued Medications   No medications on file     SIGNIFICANT DIAGNOSTIC EXAMS  02-24-16: chest x-ray: 1. Tracheostomy tube tip in satisfactory position approximately 5 cm above the carina. 2. Significant reduction in pneumomediastinum and subcutaneous emphysema since the examination 2 days ago. 3. Improved interstitial pulmonary edema, though mild interstitial edema persists. 4. New bibasilar atelectasis and new dense atelectasis and/or pneumonia in the right lower lobe.  04-04-16: right hip and pelvic x-ray: modest osteoarthritis no pelvic or hip fracture   04-20-16: left knee x-ray; mild osteoarthritis   07-06-16: 2-d echo: 1. technically difficult; 2. Normal left ventricular systolic function EF 55-60%; 3. Mild relaxation abnormality 4. Mild concentric left ventricular hypertrophy 5. Mild sclerotic aortic valve no significant stenosis 6. Trace tricuspid regurgitation . 7. Cannot rule out thrombus secondary to poor quality   07-11-16: chest x-ray; no acute cardiopulmonary process   LABS REVIEWED:   02-06-16: hgb a1c 5.2 03-08-16: wbc 8.9; gb 12.8;  hct 39.0; mcv 98.5 plt 329; glucose 196; bun 32; creat 0.99; k+ 3.6; na++ 135  03-15-16: wbc 9.7; hb 11.4; hct 35.9; mcv 88.2; plt 303; glucose 155; bun 18.1; creat 0.95; k+ 4.3; na++ 137 03-18-16:  inr 2.3 03-22-16: inr 2.7: coumadin 5 mg daily  05-18-16: INR 2.7  05-24-16: chol 105; ldl 40; trig 86; hdl 47 urine micro-albumin 9.7 06-21-16: INR 2.3   07-19-16: wbc 8.5; hgb 12.5; hct 40.4; mcv 98.8; plt 233; glucose 70; bun 29.5; creat 1.27; k+ 4.2; na++ 141; INR 2.9  08-21-16: INR 4.2    Review of Systems  Unable to perform ROS: other    Physical Exam  Constitutional: No distress.  Eyes: Conjunctivae are normal.  Neck: Neck supple. No JVD present. No thyromegaly present.  Cardiovascular: Normal rate, regular rhythm and intact distal pulses.   Respiratory: Effort normal and breath sounds normal. No respiratory distress. He has no wheezes.  GI: Soft. Bowel sounds are normal. He exhibits no distension. There is no tenderness.  Musculoskeletal: He exhibits no edema.  Able to move all extremities  Has left side weakness   Lymphadenopathy:    He has no cervical adenopathy.  Neurological: He is alert.  Skin: Skin is warm and dry. He is not diaphoretic.  Psychiatric: He has a normal mood and affect.       ASSESSMENT/ PLAN:  1. Apical mural thrombus: is presently stable; is on chronic coumadin therapy   2. Diastolic heart failure: EF 55-60% (90-13-17) will continue demadex 10 mg daily   3. Hypertension: will continue toprol xl 25 mg twice daily   4. Dyslipidemia: will continue lipitor 40 mg daily fish oil 1 mg daily  ldl 40   5. Gerd: will continue pepcid 20 mg twice daily   6. Constipation: will continue senna 2 tabs nightly   7. COPD: will continue douneb every 6 hours as needed  8. Dysphagia: no signs of aspiration present; will not make changes; is awaiting his peg tube removal  9. Depression with anxiety: will continue prozac 60 mg daily buspar 10 mg twice daily has ativan  0.5 mg every 8 hours as needed  10.  Chronic pain; will continue lyrica 75 mg twice daily and has oxycodone 5 mg every  4 hours as needed       MD is aware of resident's narcotic use and is in agreement with current plan of care. We will attempt to wean resident as appropriate.     Synthia Innocent NP [facility]  Contact [phone] Monday through Friday 8am- 5pm  After hours call [phone]

## 2016-09-08 ENCOUNTER — Encounter: Payer: Self-pay | Admitting: Internal Medicine

## 2016-09-08 ENCOUNTER — Non-Acute Institutional Stay (SKILLED_NURSING_FACILITY): Payer: Medicare Other | Admitting: Internal Medicine

## 2016-09-08 DIAGNOSIS — I10 Essential (primary) hypertension: Secondary | ICD-10-CM

## 2016-09-08 DIAGNOSIS — I513 Intracardiac thrombosis, not elsewhere classified: Secondary | ICD-10-CM | POA: Diagnosis not present

## 2016-09-08 DIAGNOSIS — E782 Mixed hyperlipidemia: Secondary | ICD-10-CM

## 2016-09-08 DIAGNOSIS — F32A Depression, unspecified: Secondary | ICD-10-CM

## 2016-09-08 DIAGNOSIS — J449 Chronic obstructive pulmonary disease, unspecified: Secondary | ICD-10-CM | POA: Diagnosis not present

## 2016-09-08 DIAGNOSIS — I5022 Chronic systolic (congestive) heart failure: Secondary | ICD-10-CM

## 2016-09-08 DIAGNOSIS — E119 Type 2 diabetes mellitus without complications: Secondary | ICD-10-CM

## 2016-09-08 DIAGNOSIS — I2109 ST elevation (STEMI) myocardial infarction involving other coronary artery of anterior wall: Secondary | ICD-10-CM

## 2016-09-08 DIAGNOSIS — F329 Major depressive disorder, single episode, unspecified: Secondary | ICD-10-CM

## 2016-09-08 DIAGNOSIS — Z794 Long term (current) use of insulin: Secondary | ICD-10-CM

## 2016-09-08 NOTE — Progress Notes (Signed)
Patient ID: Curtis Keller, male   DOB: July 29, 1956, 60 y.o.   MRN: [id number]    DATE:  09/08/2016  Location:    [facility] Room Number: 119 A Place of Service: SNF (31)   Extended Emergency Contact Information Primary Emergency Contact: Wynell Balloon [place] Home Phone: [phone] Mobile Phone: [phone] Relation: Sister Secondary Emergency Contact: Neita Carp [place] Mobile Phone: [phone] Relation: Daughter  Advanced Directive information Does patient have an advance directive?: Yes, Does patient want to make changes to advanced directive?: No - Patient declined  Chief Complaint  Patient presents with  . Medical Management of Chronic Issues    Routine Visit    HPI:  60 yo male long term resident seen today for f/u. He has no c/o. Pain controlled. He has left sided weakness.  No nursing concerns. No falls. Appetite ok. Sleeps well.   Apical mural thrombus - stable; is on chronic coumadin therapy. INR 2.3 last month. Last 2D echo in Sept 2017 could not r/o intracavity thrombus  Diastolic heart failure -  EF 55-60% (07-06-16). Takes demadex 10 mg daily   Hypertension - stable on toprol xl 25 mg twice daily   Dyslipidemia - stable on lipitor 40 mg daily; fish oil 1 mg daily. LDL 40  GERD - stable on pepcid 20 mg twice daily   Constipation - stable on senna 2 tabs nightly   COPD - no exacerbations. Takes douneb every 6 hours as needed  Dysphagia - no signs of aspiration present; Peg tube has been removed  Depression with anxiety - stable on prozac 60 mg daily; buspar 10 mg twice daily; ativan 0.5 mg every 8 hours as needed  Chronic pain - stable on lyrica 75 mg twice daily and has oxycodone 5 mg every  4 hours as needed   DM - stable. CBG 168. No low BS reactions. Takes insulin daily   Past Medical History:  Diagnosis Date  . Acute respiratory failure (HCC)   . Apical mural thrombus   .  Cardiomyopathy (HCC)   . CHF (congestive heart failure) (HCC)   . Depression   . Hypertension   . Protein calorie malnutrition (HCC)   . Status post tracheostomy [facility])     Past Surgical History:  Procedure Laterality Date  . CARDIAC CATHETERIZATION    . CORONARY ANGIOPLASTY WITH STENT PLACEMENT    . IR GENERIC HISTORICAL  07/22/2016   IR GASTROSTOMY TUBE REMOVAL 07/22/2016 [name], MD [facility]  . PR LAP, SPLENECTOMY  01/07/2016  . TRACHEOSTOMY TUBE PLACEMENT N/A 02/22/2016   Procedure: TRACHEOSTOMY;  Surgeon: [name], MD;  Location: [facility] OR;  Service: ENT;  Laterality: N/A;    Patient Care Team: Kirt Boys, DO as PCP - General (Internal Medicine)  Social History   Social History  . Marital status: Single    Spouse name: N/A  . Number of children: N/A  . Years of education: N/A   Occupational History  . Not on file.   Social History Main Topics  . Smoking status: Current Every Day Smoker    Packs/day: 2.00    Years: 38.00    Types: Cigarettes  . Smokeless tobacco: Never Used  . Alcohol use No  . Drug use: No  . Sexual activity: Not on file   Other Topics Concern  . Not on file   Social History Narrative  . No narrative on file     reports that he  has been smoking Cigarettes.  He has a 76.00 pack-year smoking history. He has never used smokeless tobacco. He reports that he does not drink alcohol or use drugs.  Family History  Problem Relation Age of Onset  . Alcohol abuse Mother   . Hypertension Mother   . Coronary artery disease Mother   . Alcohol abuse Father   . Hypertension Father   . Coronary artery disease Father   . Alcohol abuse Maternal Uncle    Family Status  Relation Status  . Mother   . Father   . Maternal Uncle     Immunization History  Administered Date(s) Administered  . PPD Test 07/01/2016, 07/08/2016    Allergies  Allergen Reactions  . Codeine Anaphylaxis and Nausea And Vomiting  . Hydrocodone Other (See  Comments)  . Hydrocodone-Acetaminophen Nausea And Vomiting  . Tegaderm Ag Mesh [Silver]   . Lisinopril Itching and Rash  . Tylenol [Acetaminophen] Nausea Only    Medications: Patient's Medications  New Prescriptions   No medications on file  Previous Medications   AMINO ACIDS-PROTEIN HYDROLYS (FEEDING SUPPLEMENT, PRO-STAT SUGAR FREE 64,) LIQD    Take 60 mLs by mouth 2 (two) times daily. Pro Stat Awc 17-100 gram-kcal    ATORVASTATIN (LIPITOR) 40 MG TABLET    Take 40 mg by mouth at bedtime.   BUSPIRONE (BUSPAR) 10 MG TABLET    Take 10 mg by mouth 2 (two) times daily.   FAMOTIDINE (PEPCID) 20 MG TABLET    Take 20 mg by mouth 2 (two) times daily.   FLUOXETINE HCL 60 MG TABS    Take 1 tablet by mouth daily.   IBUPROFEN (ADVIL,MOTRIN) 200 MG TABLET    Take 400 mg by mouth every 4 (four) hours as needed.    INSULIN LISPRO (HUMALOG KWIKPEN) 100 UNIT/ML KIWKPEN    Inject 5 Units into the skin every evening.   IPRATROPIUM-ALBUTEROL (DUONEB) 0.5-2.5 (3) MG/3ML SOLN    Take 3 mLs by nebulization every 6 (six) hours as needed (via trach).    LORAZEPAM (ATIVAN) 0.5 MG TABLET    Take 0.5 mg by mouth every 8 (eight) hours as needed for anxiety.   MENTHOL, TOPICAL ANALGESIC, (BIOFREEZE EX)    Apply topically as needed.   METOPROLOL SUCCINATE (TOPROL-XL) 25 MG 24 HR TABLET    Take 25 mg by mouth 2 (two) times daily.   OMEGA-3 FATTY ACIDS (FISH OIL PO)    Take by mouth. Reported on 03/10/2016   OXYCODONE HCL, ABUSE DETER, (OXAYDO) 5 MG TABA    Take by mouth. Give 10 mg by mouth every 6 hours prn severe pain. Give 5 mg every 4 hours prn moderate pain.   OXYGEN    Inhale 3 L into the lungs as needed. For SOB   PREGABALIN (LYRICA) 75 MG CAPSULE    Take 75 mg by mouth 2 (two) times daily.   SENNOSIDES (SENNA LAX PO)    2 tablets at bedtime. Take by mouth nightly    TORSEMIDE (DEMADEX) 10 MG TABLET    Take 10 mg by mouth daily.   VITAMIN C (ASCORBIC ACID) 500 MG TABLET    Take 500 mg by mouth 2 (two) times  daily. 10,22   WARFARIN (COUMADIN) 5 MG TABLET    Take 5 mg by mouth daily. Every evening   ZINC SULFATE 220 (50 ZN) MG CAPSULE    Take 220 mg by mouth daily. At 1800  Modified Medications   No medications on file  Discontinued Medications   OXYCODONE HCL, ABUSE DETER, (OXAYDO) 5 MG TABA    1 tablet by mouth every 4 hours as needed for pan, 2 by mouth every 6 hours as needed for pain    Review of Systems  Unable to perform ROS: Other    Vitals:   09/08/16 1243  BP: 124/68  Pulse: (!) 54  Resp: 18  Temp: 97.1 F (36.2 C)  TempSrc: Oral  SpO2: 95%  Weight: 223 lb (101.2 kg)  Height: 6\' 3"  (1.905 m)   Body mass index is 27.87 kg/m.  Physical Exam  Constitutional: He appears well-developed and well-nourished.  Lying in bed in NAD  HENT:  Mouth/Throat: Oropharynx is clear and moist.  No oral thrush  Eyes: Pupils are equal, round, and reactive to light. No scleral icterus.  Neck: Neck supple. Carotid bruit is not present. No thyromegaly present.  Cardiovascular: Normal rate, regular rhythm and intact distal pulses.  Exam reveals no gallop and no friction rub.   Murmur (1/6 SEM) heard. no distal LE swelling. No calf TTP  Pulmonary/Chest: Effort normal. He has no wheezes. He has rales (inspiratory rales R>L base). He exhibits no tenderness.  Abdominal: Soft. Bowel sounds are normal. He exhibits no distension, no abdominal bruit, no pulsatile midline mass and no mass. There is no hepatomegaly. There is no tenderness. There is no rebound and no guarding.  obese  Musculoskeletal: He exhibits edema and deformity.  LUE contracture with brace intact  Lymphadenopathy:    He has no cervical adenopathy.  Neurological: He is alert.  Skin: Skin is warm and dry. Rash (midline abdomen scaley patch with redness but no d/c or opening) noted.  Psychiatric: He has a normal mood and affect. His behavior is normal.  Left hemiparesis     Labs reviewed: Nursing Home on 08/12/2016  Component  Date Value Ref Range Status  . INR 07/28/2016 2.3* 0.9 - 1.1 Final  . Protime 07/28/2016 25.4* 10.0 - 13.8 seconds Final  Nursing Home on 07/18/2016  Component Date Value Ref Range Status  . INR 07/12/2016 2.6* 0.9 - 1.1 Final  . Protime 07/12/2016 27.8* 10.0 - 13.8 seconds Final  . INR 07/13/2016 2.7* 0.9 - 1.1 Final  . Protime 07/13/2016 28.5* 10.0 - 13.8 seconds Final  . INR 07/07/2016 2.2* 0.9 - 1.1 Final  . Protime 07/07/2016 24.3* 10.0 - 13.8 seconds Final  Nursing Home on 06/29/2016  Component Date Value Ref Range Status  . INR 06/21/2016 2.3* 0.9 - 1.1 Final  . Protime 06/21/2016 25.3* 10.0 - 13.8 seconds Final    No results found.   Assessment/Plan   ICD-9-CM ICD-10-CM   1. Diabetes mellitus, type II, insulin dependent (HCC) 250.00 E11.9    V58.67 Z79.4   2. Apical mural thrombus 410.10 I51.3   3. Essential hypertension 401.9 I10   4. Mixed hyperlipidemia 272.2 E78.2   5. Chronic obstructive pulmonary disease, unspecified COPD type (HCC) 496 J44.9   6. Heart failure, chronic systolic (HCC) 428.22 I50.22   7. Depression, unspecified depression type 311 F32.9     Check CMP, lipid panel, a1c  Cont current meds as ordered  PT/OT/ST as ordered  F/u with specialists as scheduled  Will follow  Katiana Ruland S. Ancil Linsey  [facility] and [facility] [address] [place], [place] [address] [phone] Cell (Monday-Friday 8 AM - 5 PM) [phone] After 5 PM and follow prompts

## 2016-09-14 ENCOUNTER — Other Ambulatory Visit: Payer: Self-pay

## 2016-09-14 MED ORDER — LORAZEPAM 0.5 MG PO TABS: 0.5000 mg | ORAL_TABLET | Freq: Four times a day (QID) | ORAL | 1 refills | Status: DC | PRN

## 2016-09-14 NOTE — Telephone Encounter (Signed)
RX faxed to [facility] @ [phone], phone number [phone] 

## 2016-10-03 DIAGNOSIS — Z7901 Long term (current) use of anticoagulants: Secondary | ICD-10-CM | POA: Diagnosis not present

## 2016-10-03 DIAGNOSIS — H2513 Age-related nuclear cataract, bilateral: Secondary | ICD-10-CM | POA: Diagnosis not present

## 2016-10-04 DIAGNOSIS — F419 Anxiety disorder, unspecified: Secondary | ICD-10-CM | POA: Diagnosis not present

## 2016-10-04 DIAGNOSIS — F0390 Unspecified dementia without behavioral disturbance: Secondary | ICD-10-CM | POA: Diagnosis not present

## 2016-10-04 DIAGNOSIS — F329 Major depressive disorder, single episode, unspecified: Secondary | ICD-10-CM | POA: Diagnosis not present

## 2016-10-06 ENCOUNTER — Non-Acute Institutional Stay (SKILLED_NURSING_FACILITY): Payer: Medicare Other | Admitting: Internal Medicine

## 2016-10-06 ENCOUNTER — Encounter: Payer: Self-pay | Admitting: Internal Medicine

## 2016-10-06 DIAGNOSIS — I2109 ST elevation (STEMI) myocardial infarction involving other coronary artery of anterior wall: Secondary | ICD-10-CM

## 2016-10-06 DIAGNOSIS — E46 Unspecified protein-calorie malnutrition: Secondary | ICD-10-CM | POA: Diagnosis not present

## 2016-10-06 DIAGNOSIS — I10 Essential (primary) hypertension: Secondary | ICD-10-CM

## 2016-10-06 DIAGNOSIS — J449 Chronic obstructive pulmonary disease, unspecified: Secondary | ICD-10-CM

## 2016-10-06 DIAGNOSIS — Z794 Long term (current) use of insulin: Secondary | ICD-10-CM | POA: Diagnosis not present

## 2016-10-06 DIAGNOSIS — I5032 Chronic diastolic (congestive) heart failure: Secondary | ICD-10-CM

## 2016-10-06 DIAGNOSIS — E119 Type 2 diabetes mellitus without complications: Secondary | ICD-10-CM | POA: Diagnosis not present

## 2016-10-06 DIAGNOSIS — F418 Other specified anxiety disorders: Secondary | ICD-10-CM | POA: Diagnosis not present

## 2016-10-06 DIAGNOSIS — E782 Mixed hyperlipidemia: Secondary | ICD-10-CM | POA: Diagnosis not present

## 2016-10-06 DIAGNOSIS — I513 Intracardiac thrombosis, not elsewhere classified: Secondary | ICD-10-CM | POA: Diagnosis not present

## 2016-10-06 NOTE — Progress Notes (Signed)
Patient ID: Curtis Keller, male   DOB: 1956-03-20, 60 y.o.   MRN: [id number]    DATE:  10/06/2016  Location:    [facility] Room Number: 213 A Place of Service: SNF (31)   Extended Emergency Contact Information Primary Emergency Contact: Wynell Balloon [place] Home Phone: [phone] Mobile Phone: [phone] Relation: Sister Secondary Emergency Contact: Neita Carp [place] Mobile Phone: [phone] Relation: Daughter  Advanced Directive information Does Patient Have a Medical Advance Directive?: Yes, Type of Advance Directive: Out of facility DNR (pink MOST or yellow form) (Most form attempt CPR), Pre-existing out of facility DNR order (yellow form or pink MOST form): Pink MOST form placed in chart (order not valid for inpatient use), Does patient want to make changes to medical advance directive?: No - Patient declined  Chief Complaint  Patient presents with  . Medical Management of Chronic Issues    Routine Visit    HPI:  60 yo male long term resident seen today for f/u. Nursing would like to discuss neb tx. Pt has chest tightness and occasional wheezing. No f/c. No cough. He has a hx COPD and gets prn neb tx. Appetite ok and sleeps well. He has a hx tobacco abuse  Apical mural thrombus - stable; is on chronic coumadin therapy. INR 2.3 in Oct 2017. Goal INR is 2-3. Last 2D echo in Sept 2017 could not r/o intracavity thrombus. No signs of bleeding.  Chronic Diastolic heart failure -  EF 55-60% (07-06-16). Takes demadex 10 mg daily   Hypertension - stable on toprol xl 25 mg twice daily   Dyslipidemia - stable on lipitor 40 mg daily; fish oil 1 mg daily. LDL 40  GERD - stable on pepcid 20 mg twice daily   Constipation - stable on senna 2 tabs nightly   COPD - no exacerbations. Takes douneb every 6 hours as needed  Dysphagia - no signs of aspiration present; Peg tube has been removed  Depression with anxiety -  stable on prozac 60 mg daily; buspar 10 mg twice daily; ativan 0.5 mg every 8 hours as needed  Chronic pain - stable on lyrica 75 mg twice daily and has oxycodone 5 mg every  4 hours as needed   DM - stable. No low BS reactions. Takes insulin daily. Last A1c 5.2%  Protein calorie malnutrition - gets nutritional supplements per facility protocol  Past Medical History:  Diagnosis Date  . Acute respiratory failure (HCC)   . Apical mural thrombus   . Cardiomyopathy (HCC)   . CHF (congestive heart failure) (HCC)   . Depression   . Hypertension   . Protein calorie malnutrition (HCC)   . Status post tracheostomy [facility])     Past Surgical History:  Procedure Laterality Date  . CARDIAC CATHETERIZATION    . CORONARY ANGIOPLASTY WITH STENT PLACEMENT    . IR GENERIC HISTORICAL  07/22/2016   IR GASTROSTOMY TUBE REMOVAL 07/22/2016 [name], MD [facility]  . PR LAP, SPLENECTOMY  01/07/2016  . TRACHEOSTOMY TUBE PLACEMENT N/A 02/22/2016   Procedure: TRACHEOSTOMY;  Surgeon: [name], MD;  Location: [facility] OR;  Service: ENT;  Laterality: N/A;    Patient Care Team: Kirt Boys, DO as PCP - General (Internal Medicine)  Social History   Social History  . Marital status: Single    Spouse name: N/A  . Number of children: N/A  . Years of education: N/A   Occupational History  . Not on  file.   Social History Main Topics  . Smoking status: Current Every Day Smoker    Packs/day: 2.00    Years: 38.00    Types: Cigarettes  . Smokeless tobacco: Never Used  . Alcohol use No  . Drug use: No  . Sexual activity: Not on file   Other Topics Concern  . Not on file   Social History Narrative  . No narrative on file     reports that he has been smoking Cigarettes.  He has a 76.00 pack-year smoking history. He has never used smokeless tobacco. He reports that he does not drink alcohol or use drugs.  Family History  Problem Relation Age of Onset  . Alcohol abuse Mother   .  Hypertension Mother   . Coronary artery disease Mother   . Alcohol abuse Father   . Hypertension Father   . Coronary artery disease Father   . Alcohol abuse Maternal Uncle    Family Status  Relation Status  . Mother   . Father   . Maternal Uncle     Immunization History  Administered Date(s) Administered  . PPD Test 07/01/2016, 07/08/2016    Allergies  Allergen Reactions  . Codeine Anaphylaxis and Nausea And Vomiting  . Hydrocodone Other (See Comments)  . Hydrocodone-Acetaminophen Nausea And Vomiting  . Tegaderm Ag Mesh [Silver]   . Lisinopril Itching and Rash  . Tylenol [Acetaminophen] Nausea Only    Medications: Patient's Medications  New Prescriptions   No medications on file  Previous Medications   AMINO ACIDS-PROTEIN HYDROLYS (FEEDING SUPPLEMENT, PRO-STAT SUGAR FREE 64,) LIQD    Take 60 mLs by mouth 2 (two) times daily. Pro Stat Awc 17-100 gram-kcal    ATORVASTATIN (LIPITOR) 40 MG TABLET    Take 40 mg by mouth at bedtime.   BUSPIRONE (BUSPAR) 10 MG TABLET    Take 10 mg by mouth 2 (two) times daily.   FAMOTIDINE (PEPCID) 20 MG TABLET    Take 20 mg by mouth 2 (two) times daily.   FLUOXETINE HCL 60 MG TABS    Take 1 tablet by mouth daily.   IBUPROFEN (ADVIL,MOTRIN) 200 MG TABLET    Take 400 mg by mouth every 4 (four) hours as needed.    INSULIN LISPRO (HUMALOG KWIKPEN) 100 UNIT/ML KIWKPEN    Inject 5 Units into the skin every evening.   IPRATROPIUM-ALBUTEROL (DUONEB) 0.5-2.5 (3) MG/3ML SOLN    Take 3 mLs by nebulization every 6 (six) hours as needed (via trach).    LORAZEPAM (ATIVAN) 0.5 MG TABLET    Take 1 tablet (0.5 mg total) by mouth every 6 (six) hours as needed for anxiety.   MENTHOL, TOPICAL ANALGESIC, (BIOFREEZE EX)    Apply topically as needed.   METOPROLOL SUCCINATE (TOPROL-XL) 25 MG 24 HR TABLET    Take 25 mg by mouth 2 (two) times daily.   OMEGA-3 FATTY ACIDS (FISH OIL PO)    Take by mouth. Reported on 03/10/2016   OXYCODONE HCL, ABUSE DETER, (OXAYDO) 5 MG  TABA    Take by mouth. Give 10 mg by mouth every 6 hours prn severe pain. Give 5 mg every 4 hours prn moderate pain.   OXYGEN    Inhale 3 L into the lungs as needed. For SOB   PREGABALIN (LYRICA) 75 MG CAPSULE    Take 75 mg by mouth 2 (two) times daily.   SENNOSIDES (SENNA LAX PO)    2 tablets at bedtime. Take by mouth nightly  TORSEMIDE (DEMADEX) 10 MG TABLET    Take 10 mg by mouth daily.   VITAMIN B-12 (CYANOCOBALAMIN) 1000 MCG TABLET    Take 1,000 mcg by mouth daily.   VITAMIN C (ASCORBIC ACID) 500 MG TABLET    Take 500 mg by mouth 2 (two) times daily. 10,22   WARFARIN (COUMADIN) 4 MG TABLET    Take 4 mg by mouth at bedtime. Every evening    ZINC SULFATE 220 (50 ZN) MG CAPSULE    Take 220 mg by mouth daily. At 1800  Modified Medications   No medications on file  Discontinued Medications   No medications on file    Review of Systems  Respiratory: Positive for chest tightness and wheezing.   Musculoskeletal: Positive for arthralgias.  All other systems reviewed and are negative.   Vitals:   10/06/16 1523  BP: (!) 141/82  Pulse: (!) 52  Resp: 18  Temp: 98.6 F (37 C)  TempSrc: Oral  SpO2: 98%  Weight: 223 lb (101.2 kg)  Height: 6\' 3"  (1.905 m)   Body mass index is 27.87 kg/m.  Physical Exam  Constitutional: He is oriented to person, place, and time. He appears well-developed.  Frail appearing, Lying in bed in NAD  HENT:  Mouth/Throat: Oropharynx is clear and moist.  No oral thrush  Eyes: Pupils are equal, round, and reactive to light. No scleral icterus.  Neck: Neck supple. Carotid bruit is not present. No thyromegaly present.  Cardiovascular: Normal rate, regular rhythm and intact distal pulses.  Exam reveals no gallop and no friction rub.   Murmur (1/6 SEM) heard. +1 pitting LE edema b/l. No calf TTP  Pulmonary/Chest: Effort normal. He has decreased breath sounds (b/l at base). He has wheezes (end expiratory with prolonged expiratory phase). He has no rhonchi. He  has no rales. He exhibits no tenderness.  Abdominal: Soft. Bowel sounds are normal. He exhibits no distension, no abdominal bruit, no pulsatile midline mass and no mass. There is no hepatomegaly. There is no tenderness. There is no rebound and no guarding.  obese  Musculoskeletal: He exhibits edema and deformity.  LUE contracture with brace intact  Lymphadenopathy:    He has no cervical adenopathy.  Neurological: He is alert and oriented to person, place, and time.  Left hemiparesis  Skin: Skin is warm and dry. No rash noted.  Psychiatric: He has a normal mood and affect. His behavior is normal. Thought content normal.     Labs reviewed: Nursing Home on 08/12/2016  Component Date Value Ref Range Status  . INR 07/28/2016 2.3* 0.9 - 1.1 Final  . Protime 07/28/2016 25.4* 10.0 - 13.8 seconds Final  Nursing Home on 07/18/2016  Component Date Value Ref Range Status  . INR 07/12/2016 2.6* 0.9 - 1.1 Final  . Protime 07/12/2016 27.8* 10.0 - 13.8 seconds Final  . INR 07/13/2016 2.7* 0.9 - 1.1 Final  . Protime 07/13/2016 28.5* 10.0 - 13.8 seconds Final  . INR 07/07/2016 2.2* 0.9 - 1.1 Final  . Protime 07/07/2016 24.3* 10.0 - 13.8 seconds Final    No results found.   Assessment/Plan   ICD-9-CM ICD-10-CM   1. Chronic obstructive pulmonary disease, unspecified COPD type (HCC) - failing to change as expected 496 J44.9   2. Apical mural thrombus 410.10 I51.3   3. Diabetes mellitus, type II, insulin dependent (HCC) 250.00 E11.9    V58.67 Z79.4   4. Essential hypertension 401.9 I10   5. Mixed hyperlipidemia 272.2 E78.2   6. Protein-calorie  malnutrition, unspecified severity (HCC) 263.9 E46   7. Depression with anxiety 300.4 F41.8   8. Chronic diastolic heart failure (HCC) 428.32 I50.32     Change nebs to BID  Cont other meds as ordered  Follow INR q 2-4 weeks for mural thrombus with goal of 2-3  f/u with specialists as scheduled  Wound care as indicated  PT/OT/ST as  indicated  Will follow   Marx Doig S. Ancil Linsey  [facility] and Adult Medicine [address] [place], [place] [address] [phone] Cell (Monday-Friday 8 AM - 5 PM) [phone] After 5 PM and follow prompts

## 2016-10-21 DIAGNOSIS — I429 Cardiomyopathy, unspecified: Secondary | ICD-10-CM | POA: Diagnosis not present

## 2016-10-21 DIAGNOSIS — I4891 Unspecified atrial fibrillation: Secondary | ICD-10-CM | POA: Diagnosis not present

## 2016-10-21 DIAGNOSIS — R791 Abnormal coagulation profile: Secondary | ICD-10-CM | POA: Diagnosis not present

## 2016-10-21 DIAGNOSIS — I469 Cardiac arrest, cause unspecified: Secondary | ICD-10-CM | POA: Diagnosis not present

## 2016-10-26 DIAGNOSIS — F0390 Unspecified dementia without behavioral disturbance: Secondary | ICD-10-CM | POA: Diagnosis not present

## 2016-10-26 DIAGNOSIS — R4189 Other symptoms and signs involving cognitive functions and awareness: Secondary | ICD-10-CM | POA: Diagnosis not present

## 2016-10-26 DIAGNOSIS — F419 Anxiety disorder, unspecified: Secondary | ICD-10-CM | POA: Diagnosis not present

## 2016-10-26 DIAGNOSIS — F329 Major depressive disorder, single episode, unspecified: Secondary | ICD-10-CM | POA: Diagnosis not present

## 2016-10-27 DIAGNOSIS — I469 Cardiac arrest, cause unspecified: Secondary | ICD-10-CM | POA: Diagnosis not present

## 2016-10-27 DIAGNOSIS — R791 Abnormal coagulation profile: Secondary | ICD-10-CM | POA: Diagnosis not present

## 2016-10-27 DIAGNOSIS — I4891 Unspecified atrial fibrillation: Secondary | ICD-10-CM | POA: Diagnosis not present

## 2016-10-27 DIAGNOSIS — I429 Cardiomyopathy, unspecified: Secondary | ICD-10-CM | POA: Diagnosis not present

## 2016-11-11 DIAGNOSIS — R4189 Other symptoms and signs involving cognitive functions and awareness: Secondary | ICD-10-CM | POA: Diagnosis not present

## 2016-11-11 DIAGNOSIS — F39 Unspecified mood [affective] disorder: Secondary | ICD-10-CM | POA: Diagnosis not present

## 2016-11-11 DIAGNOSIS — F329 Major depressive disorder, single episode, unspecified: Secondary | ICD-10-CM | POA: Diagnosis not present

## 2016-11-11 DIAGNOSIS — I429 Cardiomyopathy, unspecified: Secondary | ICD-10-CM | POA: Diagnosis not present

## 2016-11-11 DIAGNOSIS — I4891 Unspecified atrial fibrillation: Secondary | ICD-10-CM | POA: Diagnosis not present

## 2016-11-11 DIAGNOSIS — F419 Anxiety disorder, unspecified: Secondary | ICD-10-CM | POA: Diagnosis not present

## 2016-11-14 DIAGNOSIS — I4891 Unspecified atrial fibrillation: Secondary | ICD-10-CM | POA: Diagnosis not present

## 2016-11-14 DIAGNOSIS — I429 Cardiomyopathy, unspecified: Secondary | ICD-10-CM | POA: Diagnosis not present

## 2016-11-18 DIAGNOSIS — R41841 Cognitive communication deficit: Secondary | ICD-10-CM | POA: Diagnosis not present

## 2016-11-18 DIAGNOSIS — J9690 Respiratory failure, unspecified, unspecified whether with hypoxia or hypercapnia: Secondary | ICD-10-CM | POA: Diagnosis not present

## 2016-11-18 DIAGNOSIS — G8194 Hemiplegia, unspecified affecting left nondominant side: Secondary | ICD-10-CM | POA: Diagnosis not present

## 2016-11-18 DIAGNOSIS — R2689 Other abnormalities of gait and mobility: Secondary | ICD-10-CM | POA: Diagnosis not present

## 2016-11-18 DIAGNOSIS — M6281 Muscle weakness (generalized): Secondary | ICD-10-CM | POA: Diagnosis not present

## 2016-11-21 DIAGNOSIS — G8194 Hemiplegia, unspecified affecting left nondominant side: Secondary | ICD-10-CM | POA: Diagnosis not present

## 2016-11-21 DIAGNOSIS — R2689 Other abnormalities of gait and mobility: Secondary | ICD-10-CM | POA: Diagnosis not present

## 2016-11-21 DIAGNOSIS — R41841 Cognitive communication deficit: Secondary | ICD-10-CM | POA: Diagnosis not present

## 2016-11-21 DIAGNOSIS — M6281 Muscle weakness (generalized): Secondary | ICD-10-CM | POA: Diagnosis not present

## 2016-11-21 DIAGNOSIS — J9690 Respiratory failure, unspecified, unspecified whether with hypoxia or hypercapnia: Secondary | ICD-10-CM | POA: Diagnosis not present

## 2016-11-22 DIAGNOSIS — Z79899 Other long term (current) drug therapy: Secondary | ICD-10-CM | POA: Diagnosis not present

## 2016-11-22 DIAGNOSIS — G8194 Hemiplegia, unspecified affecting left nondominant side: Secondary | ICD-10-CM | POA: Diagnosis not present

## 2016-11-22 DIAGNOSIS — R2689 Other abnormalities of gait and mobility: Secondary | ICD-10-CM | POA: Diagnosis not present

## 2016-11-22 DIAGNOSIS — I4891 Unspecified atrial fibrillation: Secondary | ICD-10-CM | POA: Diagnosis not present

## 2016-11-22 DIAGNOSIS — J9690 Respiratory failure, unspecified, unspecified whether with hypoxia or hypercapnia: Secondary | ICD-10-CM | POA: Diagnosis not present

## 2016-11-22 DIAGNOSIS — M6281 Muscle weakness (generalized): Secondary | ICD-10-CM | POA: Diagnosis not present

## 2016-11-22 DIAGNOSIS — R41841 Cognitive communication deficit: Secondary | ICD-10-CM | POA: Diagnosis not present

## 2016-11-22 DIAGNOSIS — Z7901 Long term (current) use of anticoagulants: Secondary | ICD-10-CM | POA: Diagnosis not present

## 2016-11-23 ENCOUNTER — Non-Acute Institutional Stay (SKILLED_NURSING_FACILITY): Payer: Medicare Other | Admitting: Adult Health

## 2016-11-23 DIAGNOSIS — R1312 Dysphagia, oropharyngeal phase: Secondary | ICD-10-CM | POA: Diagnosis not present

## 2016-11-23 DIAGNOSIS — M6281 Muscle weakness (generalized): Secondary | ICD-10-CM | POA: Diagnosis not present

## 2016-11-23 DIAGNOSIS — I513 Intracardiac thrombosis, not elsewhere classified: Secondary | ICD-10-CM | POA: Diagnosis not present

## 2016-11-23 DIAGNOSIS — G8194 Hemiplegia, unspecified affecting left nondominant side: Secondary | ICD-10-CM | POA: Diagnosis not present

## 2016-11-23 DIAGNOSIS — I5022 Chronic systolic (congestive) heart failure: Secondary | ICD-10-CM

## 2016-11-23 DIAGNOSIS — R41841 Cognitive communication deficit: Secondary | ICD-10-CM | POA: Diagnosis not present

## 2016-11-23 DIAGNOSIS — Z7901 Long term (current) use of anticoagulants: Secondary | ICD-10-CM | POA: Diagnosis not present

## 2016-11-23 DIAGNOSIS — Z931 Gastrostomy status: Secondary | ICD-10-CM | POA: Diagnosis not present

## 2016-11-23 DIAGNOSIS — I2109 ST elevation (STEMI) myocardial infarction involving other coronary artery of anterior wall: Secondary | ICD-10-CM

## 2016-11-23 DIAGNOSIS — J449 Chronic obstructive pulmonary disease, unspecified: Secondary | ICD-10-CM

## 2016-11-23 DIAGNOSIS — J9690 Respiratory failure, unspecified, unspecified whether with hypoxia or hypercapnia: Secondary | ICD-10-CM | POA: Diagnosis not present

## 2016-11-23 DIAGNOSIS — K219 Gastro-esophageal reflux disease without esophagitis: Secondary | ICD-10-CM

## 2016-11-23 DIAGNOSIS — R2689 Other abnormalities of gait and mobility: Secondary | ICD-10-CM | POA: Diagnosis not present

## 2016-11-24 DIAGNOSIS — J9690 Respiratory failure, unspecified, unspecified whether with hypoxia or hypercapnia: Secondary | ICD-10-CM | POA: Diagnosis not present

## 2016-11-24 DIAGNOSIS — R41841 Cognitive communication deficit: Secondary | ICD-10-CM | POA: Diagnosis not present

## 2016-11-24 DIAGNOSIS — R2689 Other abnormalities of gait and mobility: Secondary | ICD-10-CM | POA: Diagnosis not present

## 2016-11-24 DIAGNOSIS — M6281 Muscle weakness (generalized): Secondary | ICD-10-CM | POA: Diagnosis not present

## 2016-11-24 DIAGNOSIS — G8194 Hemiplegia, unspecified affecting left nondominant side: Secondary | ICD-10-CM | POA: Diagnosis not present

## 2016-11-25 DIAGNOSIS — M6281 Muscle weakness (generalized): Secondary | ICD-10-CM | POA: Diagnosis not present

## 2016-11-25 DIAGNOSIS — G8194 Hemiplegia, unspecified affecting left nondominant side: Secondary | ICD-10-CM | POA: Diagnosis not present

## 2016-11-25 DIAGNOSIS — R41841 Cognitive communication deficit: Secondary | ICD-10-CM | POA: Diagnosis not present

## 2016-11-25 DIAGNOSIS — J9690 Respiratory failure, unspecified, unspecified whether with hypoxia or hypercapnia: Secondary | ICD-10-CM | POA: Diagnosis not present

## 2016-11-25 DIAGNOSIS — R2689 Other abnormalities of gait and mobility: Secondary | ICD-10-CM | POA: Diagnosis not present

## 2016-11-26 ENCOUNTER — Encounter (HOSPITAL_COMMUNITY): Payer: Self-pay | Admitting: Emergency Medicine

## 2016-11-26 ENCOUNTER — Inpatient Hospital Stay (HOSPITAL_COMMUNITY)
Admission: EM | Admit: 2016-11-26 | Discharge: 2016-12-13 | DRG: 535 | Disposition: A | Discharge status: Skilled Nursing Facility | Payer: Medicare Other | Attending: Internal Medicine | Admitting: Internal Medicine

## 2016-11-26 DIAGNOSIS — Z8614 Personal history of Methicillin resistant Staphylococcus aureus infection: Secondary | ICD-10-CM

## 2016-11-26 DIAGNOSIS — Z86718 Personal history of other venous thrombosis and embolism: Secondary | ICD-10-CM

## 2016-11-26 DIAGNOSIS — M25552 Pain in left hip: Secondary | ICD-10-CM | POA: Diagnosis not present

## 2016-11-26 DIAGNOSIS — Y92129 Unspecified place in nursing home as the place of occurrence of the external cause: Secondary | ICD-10-CM | POA: Diagnosis present

## 2016-11-26 DIAGNOSIS — E1122 Type 2 diabetes mellitus with diabetic chronic kidney disease: Secondary | ICD-10-CM | POA: Diagnosis present

## 2016-11-26 DIAGNOSIS — S299XXA Unspecified injury of thorax, initial encounter: Secondary | ICD-10-CM | POA: Diagnosis not present

## 2016-11-26 DIAGNOSIS — Y92122 Bedroom in nursing home as the place of occurrence of the external cause: Secondary | ICD-10-CM

## 2016-11-26 DIAGNOSIS — I130 Hypertensive heart and chronic kidney disease with heart failure and stage 1 through stage 4 chronic kidney disease, or unspecified chronic kidney disease: Secondary | ICD-10-CM | POA: Diagnosis present

## 2016-11-26 DIAGNOSIS — Z886 Allergy status to analgesic agent status: Secondary | ICD-10-CM

## 2016-11-26 DIAGNOSIS — J690 Pneumonitis due to inhalation of food and vomit: Secondary | ICD-10-CM | POA: Diagnosis not present

## 2016-11-26 DIAGNOSIS — Z66 Do not resuscitate: Secondary | ICD-10-CM

## 2016-11-26 DIAGNOSIS — E871 Hypo-osmolality and hyponatremia: Secondary | ICD-10-CM | POA: Diagnosis present

## 2016-11-26 DIAGNOSIS — I255 Ischemic cardiomyopathy: Secondary | ICD-10-CM

## 2016-11-26 DIAGNOSIS — R451 Restlessness and agitation: Secondary | ICD-10-CM | POA: Diagnosis not present

## 2016-11-26 DIAGNOSIS — Z8249 Family history of ischemic heart disease and other diseases of the circulatory system: Secondary | ICD-10-CM

## 2016-11-26 DIAGNOSIS — E870 Hyperosmolality and hypernatremia: Secondary | ICD-10-CM | POA: Diagnosis not present

## 2016-11-26 DIAGNOSIS — Z515 Encounter for palliative care: Secondary | ICD-10-CM

## 2016-11-26 DIAGNOSIS — T148XXA Other injury of unspecified body region, initial encounter: Secondary | ICD-10-CM | POA: Diagnosis not present

## 2016-11-26 DIAGNOSIS — I5022 Chronic systolic (congestive) heart failure: Secondary | ICD-10-CM | POA: Diagnosis present

## 2016-11-26 DIAGNOSIS — Z7189 Other specified counseling: Secondary | ICD-10-CM

## 2016-11-26 DIAGNOSIS — F17210 Nicotine dependence, cigarettes, uncomplicated: Secondary | ICD-10-CM | POA: Diagnosis present

## 2016-11-26 DIAGNOSIS — R0602 Shortness of breath: Secondary | ICD-10-CM

## 2016-11-26 DIAGNOSIS — Z6832 Body mass index (BMI) 32.0-32.9, adult: Secondary | ICD-10-CM

## 2016-11-26 DIAGNOSIS — I69354 Hemiplegia and hemiparesis following cerebral infarction affecting left non-dominant side: Secondary | ICD-10-CM

## 2016-11-26 DIAGNOSIS — I69391 Dysphagia following cerebral infarction: Secondary | ICD-10-CM

## 2016-11-26 DIAGNOSIS — I10 Essential (primary) hypertension: Secondary | ICD-10-CM | POA: Diagnosis present

## 2016-11-26 DIAGNOSIS — Z811 Family history of alcohol abuse and dependence: Secondary | ICD-10-CM

## 2016-11-26 DIAGNOSIS — I5021 Acute systolic (congestive) heart failure: Secondary | ICD-10-CM

## 2016-11-26 DIAGNOSIS — M79605 Pain in left leg: Secondary | ICD-10-CM | POA: Diagnosis not present

## 2016-11-26 DIAGNOSIS — E46 Unspecified protein-calorie malnutrition: Secondary | ICD-10-CM | POA: Diagnosis present

## 2016-11-26 DIAGNOSIS — Z91048 Other nonmedicinal substance allergy status: Secondary | ICD-10-CM

## 2016-11-26 DIAGNOSIS — Z955 Presence of coronary angioplasty implant and graft: Secondary | ICD-10-CM

## 2016-11-26 DIAGNOSIS — S72012A Unspecified intracapsular fracture of left femur, initial encounter for closed fracture: Secondary | ICD-10-CM

## 2016-11-26 DIAGNOSIS — R1312 Dysphagia, oropharyngeal phase: Secondary | ICD-10-CM | POA: Diagnosis present

## 2016-11-26 DIAGNOSIS — Z9981 Dependence on supplemental oxygen: Secondary | ICD-10-CM

## 2016-11-26 DIAGNOSIS — J189 Pneumonia, unspecified organism: Secondary | ICD-10-CM

## 2016-11-26 DIAGNOSIS — E1165 Type 2 diabetes mellitus with hyperglycemia: Secondary | ICD-10-CM | POA: Diagnosis present

## 2016-11-26 DIAGNOSIS — R7989 Other specified abnormal findings of blood chemistry: Secondary | ICD-10-CM | POA: Diagnosis present

## 2016-11-26 DIAGNOSIS — I714 Abdominal aortic aneurysm, without rupture: Secondary | ICD-10-CM | POA: Diagnosis present

## 2016-11-26 DIAGNOSIS — S0990XA Unspecified injury of head, initial encounter: Secondary | ICD-10-CM | POA: Diagnosis not present

## 2016-11-26 DIAGNOSIS — Z888 Allergy status to other drugs, medicaments and biological substances status: Secondary | ICD-10-CM

## 2016-11-26 DIAGNOSIS — W19XXXA Unspecified fall, initial encounter: Secondary | ICD-10-CM | POA: Diagnosis present

## 2016-11-26 DIAGNOSIS — K219 Gastro-esophageal reflux disease without esophagitis: Secondary | ICD-10-CM | POA: Diagnosis present

## 2016-11-26 DIAGNOSIS — Z7901 Long term (current) use of anticoagulants: Secondary | ICD-10-CM

## 2016-11-26 DIAGNOSIS — F0390 Unspecified dementia without behavioral disturbance: Secondary | ICD-10-CM | POA: Diagnosis present

## 2016-11-26 DIAGNOSIS — Z79891 Long term (current) use of opiate analgesic: Secondary | ICD-10-CM

## 2016-11-26 DIAGNOSIS — N179 Acute kidney failure, unspecified: Secondary | ICD-10-CM | POA: Diagnosis present

## 2016-11-26 DIAGNOSIS — Z794 Long term (current) use of insulin: Secondary | ICD-10-CM

## 2016-11-26 DIAGNOSIS — R296 Repeated falls: Secondary | ICD-10-CM | POA: Diagnosis present

## 2016-11-26 DIAGNOSIS — I2510 Atherosclerotic heart disease of native coronary artery without angina pectoris: Secondary | ICD-10-CM | POA: Diagnosis present

## 2016-11-26 DIAGNOSIS — J9621 Acute and chronic respiratory failure with hypoxia: Secondary | ICD-10-CM | POA: Diagnosis not present

## 2016-11-26 DIAGNOSIS — K5900 Constipation, unspecified: Secondary | ICD-10-CM | POA: Diagnosis not present

## 2016-11-26 DIAGNOSIS — I5023 Acute on chronic systolic (congestive) heart failure: Secondary | ICD-10-CM | POA: Diagnosis not present

## 2016-11-26 DIAGNOSIS — Z9181 History of falling: Secondary | ICD-10-CM

## 2016-11-26 DIAGNOSIS — I252 Old myocardial infarction: Secondary | ICD-10-CM

## 2016-11-26 DIAGNOSIS — Z832 Family history of diseases of the blood and blood-forming organs and certain disorders involving the immune mechanism: Secondary | ICD-10-CM

## 2016-11-26 DIAGNOSIS — Z885 Allergy status to narcotic agent status: Secondary | ICD-10-CM

## 2016-11-26 DIAGNOSIS — S72145A Nondisplaced intertrochanteric fracture of left femur, initial encounter for closed fracture: Secondary | ICD-10-CM | POA: Diagnosis not present

## 2016-11-26 DIAGNOSIS — N183 Chronic kidney disease, stage 3 (moderate): Secondary | ICD-10-CM | POA: Diagnosis present

## 2016-11-26 DIAGNOSIS — E119 Type 2 diabetes mellitus without complications: Secondary | ICD-10-CM

## 2016-11-26 DIAGNOSIS — S72002A Fracture of unspecified part of neck of left femur, initial encounter for closed fracture: Secondary | ICD-10-CM | POA: Diagnosis not present

## 2016-11-26 DIAGNOSIS — Z930 Tracheostomy status: Secondary | ICD-10-CM

## 2016-11-26 DIAGNOSIS — Z9081 Acquired absence of spleen: Secondary | ICD-10-CM

## 2016-11-26 DIAGNOSIS — G9341 Metabolic encephalopathy: Secondary | ICD-10-CM | POA: Diagnosis present

## 2016-11-26 DIAGNOSIS — F419 Anxiety disorder, unspecified: Secondary | ICD-10-CM | POA: Diagnosis present

## 2016-11-26 DIAGNOSIS — J449 Chronic obstructive pulmonary disease, unspecified: Secondary | ICD-10-CM | POA: Diagnosis present

## 2016-11-26 DIAGNOSIS — W06XXXA Fall from bed, initial encounter: Secondary | ICD-10-CM | POA: Diagnosis present

## 2016-11-26 DIAGNOSIS — Z931 Gastrostomy status: Secondary | ICD-10-CM

## 2016-11-26 DIAGNOSIS — F329 Major depressive disorder, single episode, unspecified: Secondary | ICD-10-CM | POA: Diagnosis present

## 2016-11-26 DIAGNOSIS — E785 Hyperlipidemia, unspecified: Secondary | ICD-10-CM | POA: Diagnosis present

## 2016-11-26 DIAGNOSIS — Z79899 Other long term (current) drug therapy: Secondary | ICD-10-CM

## 2016-11-26 HISTORY — DX: Unspecified injury of spleen, initial encounter: S36.00XA

## 2016-11-26 HISTORY — DX: Ischemic cardiomyopathy: I25.5

## 2016-11-26 HISTORY — DX: Chronic systolic (congestive) heart failure: I50.22

## 2016-11-26 HISTORY — DX: Unspecified dementia, unspecified severity, without behavioral disturbance, psychotic disturbance, mood disturbance, and anxiety: F03.90

## 2016-11-26 HISTORY — DX: Cerebral infarction, unspecified: I63.9

## 2016-11-26 HISTORY — DX: Respiratory failure, unspecified, unspecified whether with hypoxia or hypercapnia: J96.90

## 2016-11-26 HISTORY — DX: Hemiplegia, unspecified affecting left nondominant side: G81.94

## 2016-11-26 HISTORY — DX: Dysphagia, unspecified: R13.10

## 2016-11-26 HISTORY — DX: Unspecified fall, initial encounter: W19.XXXA

## 2016-11-26 HISTORY — DX: Anemia, unspecified: D64.9

## 2016-11-26 HISTORY — DX: Atherosclerotic heart disease of native coronary artery without angina pectoris: I25.10

## 2016-11-26 HISTORY — DX: Hyperlipidemia, unspecified: E78.5

## 2016-11-26 NOTE — ED Triage Notes (Signed)
Pt presents from [facility] for broken L hip; staff reported that pt got out of bed today at noon and fell; had an xray performed and resulted positive for fracture; pt arrives to ER lying on L hip and in NAD; staff also gave an unknown amount of tramadol at an unknown time

## 2016-11-26 NOTE — ED Provider Notes (Addendum)
[facility]-EMERGENCY DEPT Provider Note   CSN: [id number] Arrival date & time: 11/26/16  2348   By signing my name below, I, Clovis Pu, attest that this documentation has been prepared under the direction and in the presence of Gilda Crease, MD  Electronically Signed: Clovis Pu, ED Scribe. 11/26/16. 11:59 PM.   History   Chief Complaint Chief Complaint  Patient presents with  . Hip Pain   The history is provided by the patient. No language interpreter was used.   HPI Comments: LEVEL 5 CAVEAT DUE TO DEMENTIA  Curtis Keller is a 61 y.o. male who presents to the Emergency Department, from a nursing facility, complaining of acute onset left leg pain s/p a fall which occurred PTA. Pt fell while he was getting out of bed earlier today. He also reports left hip pain. He has a X-ray which shows a fracture. He has had an unknown amount of Tramadol with no significant relief. Pt denies SOB, chest pain, head pain, headaches, neck pain. No other associated symptoms noted.   Past Medical History:  Diagnosis Date  . Acute respiratory failure (HCC)   . Apical mural thrombus   . Cardiomyopathy (HCC)   . CHF (congestive heart failure) (HCC)   . Depression   . Hypertension   . Protein calorie malnutrition (HCC)   . Status post tracheostomy [facility])     Patient Active Problem List   Diagnosis Date Noted  . Fall at nursing home 07/17/2016  . Diabetes mellitus, type II, insulin dependent (HCC) 06/12/2016  . GERD (gastroesophageal reflux disease) 05/07/2016  . COPD (chronic obstructive pulmonary disease) (HCC) 04/18/2016  . Chronic anticoagulation 03/22/2016  . S/P splenectomy 03/13/2016  . PEG (percutaneous endoscopic gastrostomy) status (HCC) 03/13/2016  . MRSA (methicillin resistant Staphylococcus aureus) septicemia (HCC) 03/13/2016  . Aspiration pneumonia (HCC) 03/13/2016  . Dysphagia following cerebrovascular accident 03/13/2016  . Acute blood loss anemia 03/13/2016  .  Hyperlipidemia 03/13/2016  . Heart failure, chronic systolic (HCC)   . Hypertension   . Depression   . Status post tracheostomy (HCC)   . Cardiomyopathy (HCC)   . Apical mural thrombus   . Protein calorie malnutrition (HCC)   . Intraparenchymal hematoma of brain due to trauma [facility])     Past Surgical History:  Procedure Laterality Date  . CARDIAC CATHETERIZATION    . CORONARY ANGIOPLASTY WITH STENT PLACEMENT    . IR GENERIC HISTORICAL  07/22/2016   IR GASTROSTOMY TUBE REMOVAL 07/22/2016 [name], MD [facility]-INTERV RAD  . PR LAP, SPLENECTOMY  01/07/2016  . TRACHEOSTOMY TUBE PLACEMENT N/A 02/22/2016   Procedure: TRACHEOSTOMY;  Surgeon: [name], MD;  Location: [facility] OR;  Service: ENT;  Laterality: N/A;       Home Medications    Prior to Admission medications   Medication Sig Start Date End Date Taking? Authorizing Provider  Amino Acids-Protein Hydrolys (FEEDING SUPPLEMENT, PRO-STAT SUGAR FREE 64,) LIQD Take 60 mLs by mouth 2 (two) times daily. Pro Stat Awc 17-100 gram-kcal     Historical Provider, MD  atorvastatin (LIPITOR) 40 MG tablet Take 40 mg by mouth at bedtime.    Historical Provider, MD  busPIRone (BUSPAR) 10 MG tablet Take 10 mg by mouth 2 (two) times daily.    Historical Provider, MD  famotidine (PEPCID) 20 MG tablet Take 20 mg by mouth 2 (two) times daily.    Historical Provider, MD  FLUoxetine HCl 60 MG TABS Take 1 tablet by mouth daily.    Historical Provider,  MD  ibuprofen (ADVIL,MOTRIN) 200 MG tablet Take 400 mg by mouth every 4 (four) hours as needed.     Historical Provider, MD  insulin lispro (HUMALOG KWIKPEN) 100 UNIT/ML KiwkPen Inject 5 Units into the skin every evening.    Historical Provider, MD  ipratropium-albuterol (DUONEB) 0.5-2.5 (3) MG/3ML SOLN Take 3 mLs by nebulization every 6 (six) hours as needed (via trach).     Historical Provider, MD  LORazepam (ATIVAN) 0.5 MG tablet Take 1 tablet (0.5 mg total) by mouth every 6 (six) hours as needed for anxiety.  09/14/16   Kimber Relic, MD  Menthol, Topical Analgesic, (BIOFREEZE EX) Apply topically as needed.    Historical Provider, MD  metoprolol succinate (TOPROL-XL) 25 MG 24 hr tablet Take 25 mg by mouth 2 (two) times daily.    Historical Provider, MD  Omega-3 Fatty Acids (FISH OIL PO) Take by mouth. Reported on 03/10/2016    Historical Provider, MD  OxyCODONE HCl, Abuse Deter, (OXAYDO) 5 MG TABA Take by mouth. Give 10 mg by mouth every 6 hours prn severe pain. Give 5 mg every 4 hours prn moderate pain.    Historical Provider, MD  OXYGEN Inhale 3 L into the lungs as needed. For SOB    Historical Provider, MD  pregabalin (LYRICA) 75 MG capsule Take 75 mg by mouth 2 (two) times daily.    Historical Provider, MD  Sennosides (SENNA LAX PO) 2 tablets at bedtime. Take by mouth nightly     Historical Provider, MD  torsemide (DEMADEX) 10 MG tablet Take 10 mg by mouth daily.    Historical Provider, MD  vitamin B-12 (CYANOCOBALAMIN) 1000 MCG tablet Take 1,000 mcg by mouth daily.    Historical Provider, MD  vitamin C (ASCORBIC ACID) 500 MG tablet Take 500 mg by mouth 2 (two) times daily. 10,22    Historical Provider, MD  warfarin (COUMADIN) 4 MG tablet Take 4 mg by mouth at bedtime. Every evening     Historical Provider, MD  zinc sulfate 220 (50 Zn) MG capsule Take 220 mg by mouth daily. At 1800    Historical Provider, MD    Family History Family History  Problem Relation Age of Onset  . Alcohol abuse Mother   . Hypertension Mother   . Coronary artery disease Mother   . Alcohol abuse Father   . Hypertension Father   . Coronary artery disease Father   . Alcohol abuse Maternal Uncle     Social History Social History  Substance Use Topics  . Smoking status: Current Every Day Smoker    Packs/day: 2.00    Years: 38.00    Types: Cigarettes  . Smokeless tobacco: Never Used  . Alcohol use No     Allergies   Codeine; Hydrocodone; Hydrocodone-acetaminophen; Tegaderm ag mesh [silver]; Lisinopril; and  Tylenol [acetaminophen]   Review of Systems Review of Systems  Unable to perform ROS: Dementia   Physical Exam Updated Vital Signs BP 125/85   Pulse 65   Temp 98.4 F (36.9 C) (Oral)   Resp 20   Ht 6\' 2"  (1.88 m)   Wt 225 lb (102.1 kg)   SpO2 98%   BMI 28.89 kg/m   Physical Exam  Constitutional: He appears well-developed and well-nourished. No distress.  HENT:  Head: Normocephalic and atraumatic.  Right Ear: Hearing normal.  Left Ear: Hearing normal.  Nose: Nose normal.  Mouth/Throat: Oropharynx is clear and moist and mucous membranes are normal.  Eyes: Conjunctivae and EOM are normal.  Pupils are equal, round, and reactive to light.  Neck: Normal range of motion. Neck supple.  Cardiovascular: Regular rhythm, S1 normal and S2 normal.  Exam reveals no gallop and no friction rub.   No murmur heard. Pulmonary/Chest: Effort normal and breath sounds normal. No respiratory distress. He exhibits no tenderness.  Abdominal: Soft. Normal appearance and bowel sounds are normal. There is no hepatosplenomegaly. There is no tenderness. There is no rebound, no guarding, no tenderness at McBurney's point and negative Murphy's sign. No hernia.  Musculoskeletal: Normal range of motion. He exhibits edema and tenderness.  swelling and tenderness of the left femur area. Decreased ROM of left hip.   Neurological: He is alert. He has normal strength. He is disoriented. No cranial nerve deficit or sensory deficit. Coordination normal. GCS eye subscore is 4. GCS verbal subscore is 4. GCS motor subscore is 6.  Skin: Skin is warm, dry and intact. No rash noted. No cyanosis.  Psychiatric: He has a normal mood and affect. His speech is normal and behavior is normal. Thought content normal.  Nursing note and vitals reviewed.    ED Treatments / Results  DIAGNOSTIC STUDIES:  Oxygen Saturation is 82% on RA, low by my interpretation.    COORDINATION OF CARE:  11:56 PM Discussed treatment plan with pt  at bedside and pt agreed to plan.  Labs (all labs ordered are listed, but only abnormal results are displayed) Labs Reviewed  CBC WITH DIFFERENTIAL/PLATELET - Abnormal; Notable for the following:       Result Value   WBC 14.9 (*)    Neutro Abs 9.4 (*)    Monocytes Absolute 1.6 (*)    All other components within normal limits  COMPREHENSIVE METABOLIC PANEL - Abnormal; Notable for the following:    Glucose, Bld 137 (*)    BUN 23 (*)    Creatinine, Ser 1.97 (*)    Albumin 3.1 (*)    GFR calc non Af Amer 35 (*)    GFR calc Af Amer 40 (*)    All other components within normal limits  TROPONIN I - Abnormal; Notable for the following:    Troponin I 0.03 (*)    All other components within normal limits  PROTIME-INR - Abnormal; Notable for the following:    Prothrombin Time 25.8 (*)    All other components within normal limits  BRAIN NATRIURETIC PEPTIDE - Abnormal; Notable for the following:    B Natriuretic Peptide 311.3 (*)    All other components within normal limits    EKG  EKG Interpretation  Date/Time:  Sunday November 27 2016 01:09:30 EST Ventricular Rate:  68 PR Interval:    QRS Duration: 110 QT Interval:  409 QTC Calculation: 435 R Axis:   56 Text Interpretation:  Sinus rhythm Multiple ventricular premature complexes Incomplete left bundle branch block Probable inferior infarct, age indeterminate Anterior Q waves, possibly due to ILBBB No previous tracing Confirmed by Blinda Leatherwood  MD, Tre[place]en Monestime [phone]) on 11/27/2016 1:18:50 AM       Radiology Dg Chest 1 View  Result Date: 11/27/2016 CLINICAL DATA:  Larey Seat today.  Left hip fracture. EXAM: CHEST 1 VIEW COMPARISON:  02/24/2016 FINDINGS: Diffuse interstitial coarsening is probably chronic. No consolidation. No effusion. No pneumothorax. New unchanged hilar, mediastinal and cardiac contours. IMPRESSION: Chronic appearing interstitial coarsening. No consolidation or effusion. Electronically Signed   By: Ellery Plunk M.D.   On:  11/27/2016 01:14   Dg Pelvis 1-2 Views  Result Date: 11/27/2016 CLINICAL DATA:  Larey Seat today while trying to get out of bed unassisted. EXAM: PELVIS - 1-2 VIEW COMPARISON:  None. FINDINGS: There is an acute subcapital left hip fracture. No dislocation. No bone lesion or bony destruction. Pelvis appears intact. IMPRESSION: Subcapital left hip fracture Electronically Signed   By: Ellery Plunk M.D.   On: 11/27/2016 01:13   Dg Femur Min 2 Views Left  Result Date: 11/27/2016 CLINICAL DATA:  Larey Seat while attempting to get out of bed unassisted today. EXAM: LEFT FEMUR 2 VIEWS COMPARISON:  None. FINDINGS: There is a subcapital left hip fracture. Remainder of the femur is intact. No dislocation. No bone lesion or bony destruction. IMPRESSION: Subcapital left hip fracture Electronically Signed   By: Ellery Plunk M.D.   On: 11/27/2016 01:13    Procedures Procedures (including critical care time)  Medications Ordered in ED Medications - No data to display   Initial Impression / Assessment and Plan / ED Course  I have reviewed the triage vital signs and the nursing notes.  Pertinent labs & imaging results that were available during my care of the patient were reviewed by me and considered in my medical decision making (see chart for details).     Patient sent to the ER for treatment of hip fracture. Patient reportedly fell out of bed at noon today. He was complaining of left hip pain and had an x-ray performed at the nursing home that showed fracture. This was unavailable to [place] at arrival to the ER.  EMS reports that the patient had low oxygen saturations upon their arrival, but this improved with supplemental nasal cannula oxygen. Patient does reportedly use oxygen when necessary and has a history of congestive heart failure. He reports that his breathing is at its normal baseline at arrival. Room air oxygen saturation is 92% currently. Chest x-ray does not show any significant volume overload.  BNP is only slightly elevated. Troponin 0.03, no chest pain. We'll need to cycle. He does have evidence of acute kidney injury with creatinine 1.97.  Patient is on Coumadin for cardiac thrombus. He is therapeutic.  X-ray in the ER today confirms left hip subcapital fracture. Discussed briefly with Dr. Charlann Boxer, on call for orthopedics. He will consult.  Discussed with Dr. Katrinka Blazing, hospitalist. Will admit patient. Will obtain CT head to admission.  Final Clinical Impressions(s) / ED Diagnoses   Final diagnoses:  Closed subcapital fracture of left femur, initial encounter [facility])    New Prescriptions New Prescriptions   No medications on file  I personally performed the services described in this documentation, which was scribed in my presence. The recorded information has been reviewed and is accurate.     Gilda Crease, MD 11/27/16 0222    Gilda Crease, MD 11/27/16 [phone]

## 2016-11-27 ENCOUNTER — Encounter (HOSPITAL_COMMUNITY): Payer: Self-pay | Admitting: Family

## 2016-11-27 ENCOUNTER — Emergency Department (HOSPITAL_COMMUNITY): Payer: Medicare Other

## 2016-11-27 ENCOUNTER — Inpatient Hospital Stay (HOSPITAL_COMMUNITY): Payer: Medicare Other

## 2016-11-27 DIAGNOSIS — N179 Acute kidney failure, unspecified: Secondary | ICD-10-CM | POA: Diagnosis not present

## 2016-11-27 DIAGNOSIS — I69354 Hemiplegia and hemiparesis following cerebral infarction affecting left non-dominant side: Secondary | ICD-10-CM | POA: Diagnosis not present

## 2016-11-27 DIAGNOSIS — R1310 Dysphagia, unspecified: Secondary | ICD-10-CM | POA: Diagnosis not present

## 2016-11-27 DIAGNOSIS — S72145A Nondisplaced intertrochanteric fracture of left femur, initial encounter for closed fracture: Secondary | ICD-10-CM | POA: Diagnosis not present

## 2016-11-27 DIAGNOSIS — J9601 Acute respiratory failure with hypoxia: Secondary | ICD-10-CM | POA: Diagnosis not present

## 2016-11-27 DIAGNOSIS — Z794 Long term (current) use of insulin: Secondary | ICD-10-CM | POA: Diagnosis not present

## 2016-11-27 DIAGNOSIS — J189 Pneumonia, unspecified organism: Secondary | ICD-10-CM | POA: Diagnosis not present

## 2016-11-27 DIAGNOSIS — I10 Essential (primary) hypertension: Secondary | ICD-10-CM

## 2016-11-27 DIAGNOSIS — S72002A Fracture of unspecified part of neck of left femur, initial encounter for closed fracture: Secondary | ICD-10-CM | POA: Diagnosis not present

## 2016-11-27 DIAGNOSIS — F0390 Unspecified dementia without behavioral disturbance: Secondary | ICD-10-CM | POA: Diagnosis present

## 2016-11-27 DIAGNOSIS — R05 Cough: Secondary | ICD-10-CM | POA: Diagnosis not present

## 2016-11-27 DIAGNOSIS — E119 Type 2 diabetes mellitus without complications: Secondary | ICD-10-CM

## 2016-11-27 DIAGNOSIS — J9610 Chronic respiratory failure, unspecified whether with hypoxia or hypercapnia: Secondary | ICD-10-CM | POA: Insufficient documentation

## 2016-11-27 DIAGNOSIS — Z7901 Long term (current) use of anticoagulants: Secondary | ICD-10-CM | POA: Diagnosis not present

## 2016-11-27 DIAGNOSIS — Y92129 Unspecified place in nursing home as the place of occurrence of the external cause: Secondary | ICD-10-CM

## 2016-11-27 DIAGNOSIS — R918 Other nonspecific abnormal finding of lung field: Secondary | ICD-10-CM | POA: Diagnosis not present

## 2016-11-27 DIAGNOSIS — E782 Mixed hyperlipidemia: Secondary | ICD-10-CM

## 2016-11-27 DIAGNOSIS — Z86718 Personal history of other venous thrombosis and embolism: Secondary | ICD-10-CM | POA: Diagnosis not present

## 2016-11-27 DIAGNOSIS — J9611 Chronic respiratory failure with hypoxia: Secondary | ICD-10-CM

## 2016-11-27 DIAGNOSIS — G9340 Encephalopathy, unspecified: Secondary | ICD-10-CM | POA: Diagnosis not present

## 2016-11-27 DIAGNOSIS — S72145D Nondisplaced intertrochanteric fracture of left femur, subsequent encounter for closed fracture with routine healing: Secondary | ICD-10-CM | POA: Diagnosis not present

## 2016-11-27 DIAGNOSIS — R0602 Shortness of breath: Secondary | ICD-10-CM | POA: Diagnosis not present

## 2016-11-27 DIAGNOSIS — W19XXXA Unspecified fall, initial encounter: Secondary | ICD-10-CM

## 2016-11-27 DIAGNOSIS — I5023 Acute on chronic systolic (congestive) heart failure: Secondary | ICD-10-CM | POA: Diagnosis present

## 2016-11-27 DIAGNOSIS — Z9081 Acquired absence of spleen: Secondary | ICD-10-CM | POA: Diagnosis not present

## 2016-11-27 DIAGNOSIS — N183 Chronic kidney disease, stage 3 (moderate): Secondary | ICD-10-CM | POA: Diagnosis present

## 2016-11-27 DIAGNOSIS — J9621 Acute and chronic respiratory failure with hypoxia: Secondary | ICD-10-CM | POA: Diagnosis not present

## 2016-11-27 DIAGNOSIS — Z9181 History of falling: Secondary | ICD-10-CM | POA: Diagnosis not present

## 2016-11-27 DIAGNOSIS — I5021 Acute systolic (congestive) heart failure: Secondary | ICD-10-CM | POA: Diagnosis not present

## 2016-11-27 DIAGNOSIS — S299XXA Unspecified injury of thorax, initial encounter: Secondary | ICD-10-CM | POA: Diagnosis not present

## 2016-11-27 DIAGNOSIS — S72012A Unspecified intracapsular fracture of left femur, initial encounter for closed fracture: Secondary | ICD-10-CM | POA: Diagnosis not present

## 2016-11-27 DIAGNOSIS — I509 Heart failure, unspecified: Secondary | ICD-10-CM | POA: Diagnosis not present

## 2016-11-27 DIAGNOSIS — M79605 Pain in left leg: Secondary | ICD-10-CM | POA: Diagnosis not present

## 2016-11-27 DIAGNOSIS — Z7189 Other specified counseling: Secondary | ICD-10-CM | POA: Diagnosis not present

## 2016-11-27 DIAGNOSIS — Z9981 Dependence on supplemental oxygen: Secondary | ICD-10-CM | POA: Diagnosis not present

## 2016-11-27 DIAGNOSIS — E870 Hyperosmolality and hypernatremia: Secondary | ICD-10-CM | POA: Diagnosis not present

## 2016-11-27 DIAGNOSIS — Y92122 Bedroom in nursing home as the place of occurrence of the external cause: Secondary | ICD-10-CM | POA: Diagnosis not present

## 2016-11-27 DIAGNOSIS — I255 Ischemic cardiomyopathy: Secondary | ICD-10-CM | POA: Diagnosis not present

## 2016-11-27 DIAGNOSIS — S0990XA Unspecified injury of head, initial encounter: Secondary | ICD-10-CM | POA: Diagnosis not present

## 2016-11-27 DIAGNOSIS — I130 Hypertensive heart and chronic kidney disease with heart failure and stage 1 through stage 4 chronic kidney disease, or unspecified chronic kidney disease: Secondary | ICD-10-CM | POA: Diagnosis present

## 2016-11-27 DIAGNOSIS — J690 Pneumonitis due to inhalation of food and vomit: Secondary | ICD-10-CM | POA: Diagnosis not present

## 2016-11-27 DIAGNOSIS — I513 Intracardiac thrombosis, not elsewhere classified: Secondary | ICD-10-CM

## 2016-11-27 DIAGNOSIS — F17210 Nicotine dependence, cigarettes, uncomplicated: Secondary | ICD-10-CM | POA: Diagnosis present

## 2016-11-27 DIAGNOSIS — E871 Hypo-osmolality and hyponatremia: Secondary | ICD-10-CM | POA: Diagnosis present

## 2016-11-27 DIAGNOSIS — S72009A Fracture of unspecified part of neck of unspecified femur, initial encounter for closed fracture: Secondary | ICD-10-CM | POA: Insufficient documentation

## 2016-11-27 DIAGNOSIS — J449 Chronic obstructive pulmonary disease, unspecified: Secondary | ICD-10-CM | POA: Diagnosis not present

## 2016-11-27 DIAGNOSIS — Z66 Do not resuscitate: Secondary | ICD-10-CM | POA: Diagnosis not present

## 2016-11-27 DIAGNOSIS — W06XXXA Fall from bed, initial encounter: Secondary | ICD-10-CM | POA: Diagnosis present

## 2016-11-27 DIAGNOSIS — I5022 Chronic systolic (congestive) heart failure: Secondary | ICD-10-CM

## 2016-11-27 DIAGNOSIS — G9341 Metabolic encephalopathy: Secondary | ICD-10-CM | POA: Diagnosis present

## 2016-11-27 DIAGNOSIS — E1122 Type 2 diabetes mellitus with diabetic chronic kidney disease: Secondary | ICD-10-CM | POA: Diagnosis present

## 2016-11-27 DIAGNOSIS — Z515 Encounter for palliative care: Secondary | ICD-10-CM | POA: Diagnosis not present

## 2016-11-27 DIAGNOSIS — Z930 Tracheostomy status: Secondary | ICD-10-CM | POA: Diagnosis not present

## 2016-11-27 DIAGNOSIS — E46 Unspecified protein-calorie malnutrition: Secondary | ICD-10-CM | POA: Diagnosis present

## 2016-11-27 DIAGNOSIS — I5030 Unspecified diastolic (congestive) heart failure: Secondary | ICD-10-CM | POA: Diagnosis not present

## 2016-11-27 LAB — URINALYSIS, ROUTINE W REFLEX MICROSCOPIC
Bilirubin Urine: NEGATIVE
GLUCOSE, UA: NEGATIVE mg/dL
KETONES UR: NEGATIVE mg/dL
LEUKOCYTES UA: NEGATIVE
Nitrite: NEGATIVE
PH: 6 (ref 5.0–8.0)
Protein, ur: 100 mg/dL — AB
Specific Gravity, Urine: 1.015 (ref 1.005–1.030)
Squamous Epithelial / LPF: NONE SEEN

## 2016-11-27 LAB — CBC WITH DIFFERENTIAL/PLATELET
BASOS ABS: 0 10*3/uL (ref 0.0–0.1)
Basophils Relative: 0 %
EOS ABS: 0.6 10*3/uL (ref 0.0–0.7)
Eosinophils Relative: 4 %
HCT: 45.4 % (ref 39.0–52.0)
Hemoglobin: 15.2 g/dL (ref 13.0–17.0)
LYMPHS ABS: 3.3 10*3/uL (ref 0.7–4.0)
Lymphocytes Relative: 22 %
MCH: 32.7 pg (ref 26.0–34.0)
MCHC: 33.5 g/dL (ref 30.0–36.0)
MCV: 97.6 fL (ref 78.0–100.0)
MONO ABS: 1.6 10*3/uL — AB (ref 0.1–1.0)
MONOS PCT: 11 %
Neutro Abs: 9.4 10*3/uL — ABNORMAL HIGH (ref 1.7–7.7)
Neutrophils Relative %: 63 %
PLATELETS: 174 10*3/uL (ref 150–400)
RBC: 4.65 MIL/uL (ref 4.22–5.81)
RDW: 14.6 % (ref 11.5–15.5)
WBC: 14.9 10*3/uL — AB (ref 4.0–10.5)

## 2016-11-27 LAB — GLUCOSE, CAPILLARY
GLUCOSE-CAPILLARY: 125 mg/dL — AB (ref 65–99)
Glucose-Capillary: 124 mg/dL — ABNORMAL HIGH (ref 65–99)
Glucose-Capillary: 130 mg/dL — ABNORMAL HIGH (ref 65–99)
Glucose-Capillary: 223 mg/dL — ABNORMAL HIGH (ref 65–99)
Glucose-Capillary: 233 mg/dL — ABNORMAL HIGH (ref 65–99)

## 2016-11-27 LAB — COMPREHENSIVE METABOLIC PANEL
ALBUMIN: 3.1 g/dL — AB (ref 3.5–5.0)
ALT: 29 U/L (ref 17–63)
AST: 32 U/L (ref 15–41)
Alkaline Phosphatase: 82 U/L (ref 38–126)
Anion gap: 12 (ref 5–15)
BUN: 23 mg/dL — AB (ref 6–20)
CHLORIDE: 102 mmol/L (ref 101–111)
CO2: 24 mmol/L (ref 22–32)
Calcium: 9 mg/dL (ref 8.9–10.3)
Creatinine, Ser: 1.97 mg/dL — ABNORMAL HIGH (ref 0.61–1.24)
GFR calc Af Amer: 40 mL/min — ABNORMAL LOW (ref >60)
GFR calc non Af Amer: 35 mL/min — ABNORMAL LOW (ref >60)
GLUCOSE: 137 mg/dL — AB (ref 65–99)
POTASSIUM: 4.3 mmol/L (ref 3.5–5.1)
Sodium: 138 mmol/L (ref 135–145)
Total Bilirubin: 0.7 mg/dL (ref 0.3–1.2)
Total Protein: 7.1 g/dL (ref 6.5–8.1)

## 2016-11-27 LAB — TROPONIN I
TROPONIN I: 0.03 ng/mL — AB (ref <0.03)
Troponin I: 0.04 ng/mL (ref <0.03)
Troponin I: 0.03 ng/mL (ref <0.03)
Troponin I: 0.05 ng/mL (ref <0.03)

## 2016-11-27 LAB — SURGICAL PCR SCREEN
MRSA, PCR: POSITIVE — AB
STAPHYLOCOCCUS AUREUS: POSITIVE — AB

## 2016-11-27 LAB — TYPE AND SCREEN
ABO/RH(D): A POS
Antibody Screen: NEGATIVE

## 2016-11-27 LAB — BASIC METABOLIC PANEL
Anion gap: 19 — ABNORMAL HIGH (ref 5–15)
BUN: 25 mg/dL — AB (ref 6–20)
CHLORIDE: 100 mmol/L — AB (ref 101–111)
CO2: 23 mmol/L (ref 22–32)
Calcium: 9.2 mg/dL (ref 8.9–10.3)
Creatinine, Ser: 1.61 mg/dL — ABNORMAL HIGH (ref 0.61–1.24)
GFR calc Af Amer: 52 mL/min — ABNORMAL LOW (ref >60)
GFR calc non Af Amer: 45 mL/min — ABNORMAL LOW (ref >60)
GLUCOSE: 37 mg/dL — AB (ref 65–99)
Potassium: 4.1 mmol/L (ref 3.5–5.1)
Sodium: 142 mmol/L (ref 135–145)

## 2016-11-27 LAB — PROTIME-INR
INR: 2.31
Prothrombin Time: 25.8 seconds — ABNORMAL HIGH (ref 11.4–15.2)

## 2016-11-27 LAB — BRAIN NATRIURETIC PEPTIDE: B NATRIURETIC PEPTIDE 5: 311.3 pg/mL — AB (ref 0.0–100.0)

## 2016-11-27 LAB — INFLUENZA PANEL BY PCR (TYPE A & B)
INFLAPCR: NEGATIVE
Influenza B By PCR: NEGATIVE

## 2016-11-27 MED ORDER — FAMOTIDINE 20 MG PO TABS
20.0000 mg | ORAL_TABLET | Freq: Two times a day (BID) | ORAL | Status: DC
  Administered 2016-11-27 – 2016-12-13 (×33): 20 mg via ORAL
  Filled 2016-11-27 (×32): qty 1

## 2016-11-27 MED ORDER — CHLORHEXIDINE GLUCONATE CLOTH 2 % EX PADS
6.0000 | MEDICATED_PAD | Freq: Every day | CUTANEOUS | Status: DC
  Administered 2016-11-28 – 2016-12-01 (×4): 6 via TOPICAL

## 2016-11-27 MED ORDER — INSULIN ASPART 100 UNIT/ML ~~LOC~~ SOLN
0.0000 [IU] | Freq: Three times a day (TID) | SUBCUTANEOUS | Status: DC
  Administered 2016-11-27 (×2): 1 [IU] via SUBCUTANEOUS
  Administered 2016-11-29: 2 [IU] via SUBCUTANEOUS
  Administered 2016-11-29 (×2): 3 [IU] via SUBCUTANEOUS
  Administered 2016-11-30: 1 [IU] via SUBCUTANEOUS
  Administered 2016-11-30: 3 [IU] via SUBCUTANEOUS
  Administered 2016-11-30: 2 [IU] via SUBCUTANEOUS
  Administered 2016-12-01: 1 [IU] via SUBCUTANEOUS
  Administered 2016-12-01: 3 [IU] via SUBCUTANEOUS
  Administered 2016-12-01 – 2016-12-02 (×2): 2 [IU] via SUBCUTANEOUS
  Administered 2016-12-02: 3 [IU] via SUBCUTANEOUS
  Administered 2016-12-03: 2 [IU] via SUBCUTANEOUS
  Administered 2016-12-03: 3 [IU] via SUBCUTANEOUS
  Administered 2016-12-03: 2 [IU] via SUBCUTANEOUS
  Administered 2016-12-04: 3 [IU] via SUBCUTANEOUS
  Administered 2016-12-04: 1 [IU] via SUBCUTANEOUS
  Administered 2016-12-05: 5 [IU] via SUBCUTANEOUS
  Administered 2016-12-05: 2 [IU] via SUBCUTANEOUS
  Administered 2016-12-05: 3 [IU] via SUBCUTANEOUS
  Administered 2016-12-06: 1 [IU] via SUBCUTANEOUS
  Administered 2016-12-06 (×2): 2 [IU] via SUBCUTANEOUS
  Administered 2016-12-07 (×3): 3 [IU] via SUBCUTANEOUS
  Administered 2016-12-08: 2 [IU] via SUBCUTANEOUS
  Administered 2016-12-08: 1 [IU] via SUBCUTANEOUS
  Administered 2016-12-08: 3 [IU] via SUBCUTANEOUS
  Administered 2016-12-09 (×2): 2 [IU] via SUBCUTANEOUS
  Administered 2016-12-09: 1 [IU] via SUBCUTANEOUS
  Administered 2016-12-10 (×3): 2 [IU] via SUBCUTANEOUS
  Administered 2016-12-11: 3 [IU] via SUBCUTANEOUS
  Administered 2016-12-11 (×2): 2 [IU] via SUBCUTANEOUS
  Administered 2016-12-12: 3 [IU] via SUBCUTANEOUS
  Administered 2016-12-12: 2 [IU] via SUBCUTANEOUS
  Administered 2016-12-12: 3 [IU] via SUBCUTANEOUS
  Administered 2016-12-13: 2 [IU] via SUBCUTANEOUS
  Administered 2016-12-13: 3 [IU] via SUBCUTANEOUS

## 2016-11-27 MED ORDER — PREGABALIN 25 MG PO CAPS
75.0000 mg | ORAL_CAPSULE | Freq: Two times a day (BID) | ORAL | Status: DC
  Administered 2016-11-27 – 2016-11-28 (×3): 75 mg via ORAL
  Filled 2016-11-27 (×3): qty 3

## 2016-11-27 MED ORDER — METOPROLOL SUCCINATE ER 25 MG PO TB24
25.0000 mg | ORAL_TABLET | Freq: Two times a day (BID) | ORAL | Status: DC
  Administered 2016-11-27 (×2): 25 mg via ORAL
  Filled 2016-11-27 (×2): qty 1

## 2016-11-27 MED ORDER — ACETAMINOPHEN 325 MG PO TABS
650.0000 mg | ORAL_TABLET | ORAL | Status: DC | PRN
  Administered 2016-11-27 – 2016-12-11 (×5): 650 mg via ORAL
  Filled 2016-11-27 (×5): qty 2

## 2016-11-27 MED ORDER — MUPIROCIN 2 % EX OINT
1.0000 "application " | TOPICAL_OINTMENT | Freq: Two times a day (BID) | CUTANEOUS | Status: AC
  Administered 2016-11-28 – 2016-12-02 (×10): 1 via NASAL
  Filled 2016-11-27 (×5): qty 22

## 2016-11-27 MED ORDER — SODIUM CHLORIDE 0.9 % IV SOLN
INTRAVENOUS | Status: DC
  Administered 2016-11-27: 10:00:00 via INTRAVENOUS

## 2016-11-27 MED ORDER — IPRATROPIUM-ALBUTEROL 0.5-2.5 (3) MG/3ML IN SOLN
3.0000 mL | Freq: Two times a day (BID) | RESPIRATORY_TRACT | Status: DC
  Administered 2016-11-27 – 2016-11-28 (×3): 3 mL via RESPIRATORY_TRACT
  Filled 2016-11-27 (×3): qty 3

## 2016-11-27 MED ORDER — SODIUM CHLORIDE 0.9 % IV SOLN
INTRAVENOUS | Status: DC
  Administered 2016-11-27: 07:00:00 via INTRAVENOUS

## 2016-11-27 MED ORDER — LORAZEPAM 1 MG PO TABS: 0.5000 mg | ORAL_TABLET | Freq: Four times a day (QID) | ORAL | Status: DC | PRN

## 2016-11-27 MED ORDER — OXYCODONE HCL 5 MG PO TABS
5.0000 mg | ORAL_TABLET | ORAL | Status: DC | PRN
  Administered 2016-11-27 – 2016-11-28 (×5): 5 mg via ORAL
  Filled 2016-11-27 (×5): qty 1

## 2016-11-27 MED ORDER — DIVALPROEX SODIUM 125 MG PO DR TAB
125.0000 mg | DELAYED_RELEASE_TABLET | Freq: Three times a day (TID) | ORAL | Status: DC
  Administered 2016-11-27 – 2016-12-13 (×47): 125 mg via ORAL
  Filled 2016-11-27 (×50): qty 1

## 2016-11-27 MED ORDER — SENNA 8.6 MG PO TABS
2.0000 | ORAL_TABLET | Freq: Two times a day (BID) | ORAL | Status: DC
  Administered 2016-11-27 (×2): 17.2 mg via ORAL
  Filled 2016-11-27 (×2): qty 2

## 2016-11-27 MED ORDER — FLUOXETINE HCL 20 MG PO CAPS
60.0000 mg | ORAL_CAPSULE | Freq: Every day | ORAL | Status: DC
  Administered 2016-11-27 – 2016-12-13 (×17): 60 mg via ORAL
  Filled 2016-11-27 (×11): qty 3
  Filled 2016-11-27: qty 6
  Filled 2016-11-27 (×4): qty 3
  Filled 2016-11-27: qty 6

## 2016-11-27 MED ORDER — FENTANYL CITRATE (PF) 100 MCG/2ML IJ SOLN
25.0000 ug | INTRAMUSCULAR | Status: DC | PRN
  Administered 2016-11-27 (×2): 25 ug via INTRAVENOUS
  Filled 2016-11-27 (×2): qty 2

## 2016-11-27 MED ORDER — ATORVASTATIN CALCIUM 40 MG PO TABS
40.0000 mg | ORAL_TABLET | Freq: Every day | ORAL | Status: DC
  Administered 2016-11-27 – 2016-12-12 (×16): 40 mg via ORAL
  Filled 2016-11-27 (×16): qty 1

## 2016-11-27 MED ORDER — DEXTROSE 50 % IV SOLN
INTRAVENOUS | Status: AC
  Filled 2016-11-27: qty 50

## 2016-11-27 MED ORDER — BUSPIRONE HCL 10 MG PO TABS
10.0000 mg | ORAL_TABLET | Freq: Two times a day (BID) | ORAL | Status: DC
  Administered 2016-11-27 – 2016-12-13 (×33): 10 mg via ORAL
  Filled 2016-11-27 (×33): qty 1

## 2016-11-27 NOTE — Consult Note (Signed)
Reason for Consult: left femoral neck fracture Referring Physician: Allena Katz, MD  Curtis Keller is an 61 y.o. male.  HPI: Curtis Keller is a 61 y.o. male with medical history significant of HTN, systolic CHF EF 25-30%, CAD, DM type II, AAA, COPD, chronic respiratory failure on 2 L of [place] oxygen at night CVA with residual left-sided hemiparesis/dysphagia, apical thrombus on Coumadin; who presents from nursing facility after having a fall out of bed around 12 noon yesterday and patient reports complaints of left hip pain. X-rays have facility noted a left hip fracture. Patient was given a tramadol prior to arrival and notes that he gave him mild relief of symptoms. Nursing facility sent to the hospital for further evaluation and management. Patient currently denies any shortness of breath, headache, neck pain, abdominal pain, nausea.  Reviewed this history with him.  Resides in Nursing facility due to medical co-morbidities  Past Medical History:  Diagnosis Date  . Acute respiratory failure (HCC)   . Apical mural thrombus   . Cardiomyopathy (HCC)   . CHF (congestive heart failure) (HCC)   . Depression   . Hypertension   . Protein calorie malnutrition (HCC)   . Status post tracheostomy [facility])     Past Surgical History:  Procedure Laterality Date  . CARDIAC CATHETERIZATION    . CORONARY ANGIOPLASTY WITH STENT PLACEMENT    . IR GENERIC HISTORICAL  07/22/2016   IR GASTROSTOMY TUBE REMOVAL 07/22/2016 [name], MD [facility]-INTERV RAD  . PR LAP, SPLENECTOMY  01/07/2016  . TRACHEOSTOMY TUBE PLACEMENT N/A 02/22/2016   Procedure: TRACHEOSTOMY;  Surgeon: [name], MD;  Location: [facility] OR;  Service: ENT;  Laterality: N/A;    Family History  Problem Relation Age of Onset  . Alcohol abuse Mother   . Hypertension Mother   . Coronary artery disease Mother   . Alcohol abuse Father   . Hypertension Father   . Coronary artery disease Father   . Alcohol abuse Maternal Uncle     Social History:   reports that he has been smoking Cigarettes.  He has a 76.00 pack-year smoking history. He has never used smokeless tobacco. He reports that he does not drink alcohol or use drugs.  Allergies:  Allergies  Allergen Reactions  . Codeine Anaphylaxis and Nausea And Vomiting  . Hydrocodone Other (See Comments)    On MAR  . Hydrocodone-Acetaminophen Nausea And Vomiting  . Tegaderm Ag Mesh [Silver] Other (See Comments)    On MAR  . Lisinopril Itching and Rash  . Tylenol [Acetaminophen] Nausea Only    Medications: I have reviewed the patient's current medications.  Results for orders placed or performed during the hospital encounter of 11/26/16 (from the past 24 hour(s))  CBC with Differential/Platelet     Status: Abnormal   Collection Time: 11/27/16 12:00 AM  Result Value Ref Range   WBC 14.9 (H) 4.0 - 10.5 K/uL   RBC 4.65 4.22 - 5.81 MIL/uL   Hemoglobin 15.2 13.0 - 17.0 g/dL   HCT 40.9 81.1 - 91.4 %   MCV 97.6 78.0 - 100.0 fL   MCH 32.7 26.0 - 34.0 pg   MCHC 33.5 30.0 - 36.0 g/dL   RDW 78.2 95.6 - 21.3 %   Platelets 174 150 - 400 K/uL   Neutrophils Relative % 63 %   Lymphocytes Relative 22 %   Monocytes Relative 11 %   Eosinophils Relative 4 %   Basophils Relative 0 %   Neutro Abs 9.4 (H)  1.7 - 7.7 K/uL   Lymphs Abs 3.3 0.7 - 4.0 K/uL   Monocytes Absolute 1.6 (H) 0.1 - 1.0 K/uL   Eosinophils Absolute 0.6 0.0 - 0.7 K/uL   Basophils Absolute 0.0 0.0 - 0.1 K/uL   RBC Morphology TARGET CELLS   Comprehensive metabolic panel     Status: Abnormal   Collection Time: 11/27/16 12:00 AM  Result Value Ref Range   Sodium 138 135 - 145 mmol/L   Potassium 4.3 3.5 - 5.1 mmol/L   Chloride 102 101 - 111 mmol/L   CO2 24 22 - 32 mmol/L   Glucose, Bld 137 (H) 65 - 99 mg/dL   BUN 23 (H) 6 - 20 mg/dL   Creatinine, Ser 1.61 (H) 0.61 - 1.24 mg/dL   Calcium 9.0 8.9 - 09.6 mg/dL   Total Protein 7.1 6.5 - 8.1 g/dL   Albumin 3.1 (L) 3.5 - 5.0 g/dL   AST 32 15 - 41 U/L   ALT 29 17 - 63 U/L    Alkaline Phosphatase 82 38 - 126 U/L   Total Bilirubin 0.7 0.3 - 1.2 mg/dL   GFR calc non Af Amer 35 (L) >60 mL/min   GFR calc Af Amer 40 (L) >60 mL/min   Anion gap 12 5 - 15  Troponin I     Status: Abnormal   Collection Time: 11/27/16 12:00 AM  Result Value Ref Range   Troponin I 0.03 (HH) <0.03 ng/mL  Protime-INR     Status: Abnormal   Collection Time: 11/27/16 12:00 AM  Result Value Ref Range   Prothrombin Time 25.8 (H) 11.4 - 15.2 seconds   INR 2.31   Brain natriuretic peptide     Status: Abnormal   Collection Time: 11/27/16 12:00 AM  Result Value Ref Range   B Natriuretic Peptide 311.3 (H) 0.0 - 100.0 pg/mL  Troponin I (q 6hr x 3)     Status: Abnormal   Collection Time: 11/27/16  3:46 AM  Result Value Ref Range   Troponin I 0.04 (HH) <0.03 ng/mL  Urinalysis, Routine w reflex microscopic     Status: Abnormal   Collection Time: 11/27/16  3:55 AM  Result Value Ref Range   Color, Urine YELLOW YELLOW   APPearance CLEAR CLEAR   Specific Gravity, Urine 1.015 1.005 - 1.030   pH 6.0 5.0 - 8.0   Glucose, UA NEGATIVE NEGATIVE mg/dL   Hgb urine dipstick LARGE (A) NEGATIVE   Bilirubin Urine NEGATIVE NEGATIVE   Ketones, ur NEGATIVE NEGATIVE mg/dL   Protein, ur 045 (A) NEGATIVE mg/dL   Nitrite NEGATIVE NEGATIVE   Leukocytes, UA NEGATIVE NEGATIVE   RBC / HPF TOO NUMEROUS TO COUNT 0 - 5 RBC/hpf   WBC, UA 0-5 0 - 5 WBC/hpf   Bacteria, UA RARE (A) NONE SEEN   Squamous Epithelial / LPF NONE SEEN NONE SEEN   Mucous PRESENT   Glucose, capillary     Status: Abnormal   Collection Time: 11/27/16  5:49 AM  Result Value Ref Range   Glucose-Capillary 130 (H) 65 - 99 mg/dL  Type and screen [facility]     Status: None   Collection Time: 11/27/16  5:50 AM  Result Value Ref Range   ABO/RH(D) A POS    Antibody Screen NEG    Sample Expiration 11/30/2016     X-ray: CLINICAL DATA:  Larey Seat while attempting to get out of bed unassisted today.  EXAM: LEFT FEMUR 2  VIEWS  COMPARISON:  None.  FINDINGS: There is a subcapital left hip fracture. Remainder of the femur is intact. No dislocation. No bone lesion or bony destruction.  IMPRESSION: Subcapital left hip fracture   Electronically Signed   By: Ellery Plunk M.D  ROS  As per HPI otherwise 10 point review of systems negative Significant medical history as noted  Blood pressure 114/78, pulse 64, temperature 98.1 F (36.7 C), temperature source Oral, resp. rate 16, height 6\' 2"  (1.88 m), weight 102.1 kg (225 lb), SpO2 95 %.  Physical Exam  In bed this am relatively comfortable Left leg held flexed General medical exam reviewed from admission  Assessment/Plan: Left femoral neck fracture Multiple medical issues reviewed with Hospitalist Cardiology consult for peri-operative management due to poor reported EF Hold coumadin Would recommend hemiarthroplasty for him once INR normalized and he is as optimal as possible More to come   Kimmerly Lora D 11/27/2016, 8:43 AM

## 2016-11-27 NOTE — H&P (Signed)
History and Physical    Curtis Keller ZOX:[id number] DOB: July 22, 1956 DOA: 11/26/2016  Referring MD/NP/PA: Cordie Grice PCP: Kirt Boys, DO  Patient coming from: [facility]  Chief Complaint: Left hip pain  HPI: Curtis Keller is a 61 y.o. male with medical history significant of HTN, systolic CHF EF 25-30%, CAD, DM type II, AAA, COPD, chronic respiratory failure on 2 L of [place] oxygen at night CVA with residual left-sided hemiparesis/dysphagia, apical thrombus on Coumadin; who presents from nursing facility after having a fall out of bed around 12 noon yesterday and patient reports complaints of left hip pain. X-rays have facility noted a left hip fracture. Patient was given a tramadol prior to arrival and notes that he gave him mild relief of symptoms. Nursing facility sent to the hospital for further evaluation and management. Patient currently denies any shortness of breath, headache, neck pain, abdominal pain, nausea, or vomiting.  ED Course: Upon admission to the emergency department patient was seen by a afebrile, pulse in the 60's, respirations 15-20, blood pressure maintained, O2 saturations documented as low as 82% on RA. Lab work revealed WBC of 14.9, BUN 23, creatinine 1.97, troponin 0.03, INR 2.31, and glucose 137. Initial x-ray images show a left subcapital hip fracture. Chest x-ray showed chronic interstitial coarsening. Orthopedics was consulted in the ED and will see the patient and a.m.  Review of Systems: As per HPI otherwise 10 point review of systems negative.   Past Medical History:  Diagnosis Date  . Acute respiratory failure (HCC)   . Apical mural thrombus   . Cardiomyopathy (HCC)   . CHF (congestive heart failure) (HCC)   . Depression   . Hypertension   . Protein calorie malnutrition (HCC)   . Status post tracheostomy [facility])     Past Surgical History:  Procedure Laterality Date  . CARDIAC CATHETERIZATION    . CORONARY ANGIOPLASTY WITH STENT PLACEMENT     . IR GENERIC HISTORICAL  07/22/2016   IR GASTROSTOMY TUBE REMOVAL 07/22/2016 [name], MD [facility]-INTERV RAD  . PR LAP, SPLENECTOMY  01/07/2016  . TRACHEOSTOMY TUBE PLACEMENT N/A 02/22/2016   Procedure: TRACHEOSTOMY;  Surgeon: [name], MD;  Location: [facility] OR;  Service: ENT;  Laterality: N/A;     reports that he has been smoking Cigarettes.  He has a 76.00 pack-year smoking history. He has never used smokeless tobacco. He reports that he does not drink alcohol or use drugs.  Allergies  Allergen Reactions  . Codeine Anaphylaxis and Nausea And Vomiting  . Hydrocodone Other (See Comments)  . Hydrocodone-Acetaminophen Nausea And Vomiting  . Tegaderm Ag Mesh [Silver]   . Lisinopril Itching and Rash  . Tylenol [Acetaminophen] Nausea Only    Family History  Problem Relation Age of Onset  . Alcohol abuse Mother   . Hypertension Mother   . Coronary artery disease Mother   . Alcohol abuse Father   . Hypertension Father   . Coronary artery disease Father   . Alcohol abuse Maternal Uncle     Prior to Admission medications   Medication Sig Start Date End Date Taking? Authorizing Provider  Amino Acids-Protein Hydrolys (FEEDING SUPPLEMENT, PRO-STAT SUGAR FREE 64,) LIQD Take 60 mLs by mouth 2 (two) times daily. Pro Stat Awc 17-100 gram-kcal     Historical Provider, MD  atorvastatin (LIPITOR) 40 MG tablet Take 40 mg by mouth at bedtime.    Historical Provider, MD  busPIRone (BUSPAR) 10 MG tablet Take 10 mg by mouth 2 (two)  times daily.    Historical Provider, MD  famotidine (PEPCID) 20 MG tablet Take 20 mg by mouth 2 (two) times daily.    Historical Provider, MD  FLUoxetine HCl 60 MG TABS Take 1 tablet by mouth daily.    Historical Provider, MD  ibuprofen (ADVIL,MOTRIN) 200 MG tablet Take 400 mg by mouth every 4 (four) hours as needed.     Historical Provider, MD  insulin lispro (HUMALOG KWIKPEN) 100 UNIT/ML KiwkPen Inject 5 Units into the skin every evening.    Historical Provider, MD    ipratropium-albuterol (DUONEB) 0.5-2.5 (3) MG/3ML SOLN Take 3 mLs by nebulization every 6 (six) hours as needed (via trach).     Historical Provider, MD  LORazepam (ATIVAN) 0.5 MG tablet Take 1 tablet (0.5 mg total) by mouth every 6 (six) hours as needed for anxiety. 09/14/16   Kimber Relic, MD  Menthol, Topical Analgesic, (BIOFREEZE EX) Apply topically as needed.    Historical Provider, MD  metoprolol succinate (TOPROL-XL) 25 MG 24 hr tablet Take 25 mg by mouth 2 (two) times daily.    Historical Provider, MD  Omega-3 Fatty Acids (FISH OIL PO) Take by mouth. Reported on 03/10/2016    Historical Provider, MD  OxyCODONE HCl, Abuse Deter, (OXAYDO) 5 MG TABA Take by mouth. Give 10 mg by mouth every 6 hours prn severe pain. Give 5 mg every 4 hours prn moderate pain.    Historical Provider, MD  OXYGEN Inhale 3 L into the lungs as needed. For SOB    Historical Provider, MD  pregabalin (LYRICA) 75 MG capsule Take 75 mg by mouth 2 (two) times daily.    Historical Provider, MD  Sennosides (SENNA LAX PO) 2 tablets at bedtime. Take by mouth nightly     Historical Provider, MD  torsemide (DEMADEX) 10 MG tablet Take 10 mg by mouth daily.    Historical Provider, MD  vitamin B-12 (CYANOCOBALAMIN) 1000 MCG tablet Take 1,000 mcg by mouth daily.    Historical Provider, MD  vitamin C (ASCORBIC ACID) 500 MG tablet Take 500 mg by mouth 2 (two) times daily. 10,22    Historical Provider, MD  warfarin (COUMADIN) 4 MG tablet Take 4 mg by mouth at bedtime. Every evening     Historical Provider, MD  zinc sulfate 220 (50 Zn) MG capsule Take 220 mg by mouth daily. At 1800    Historical Provider, MD    Physical Exam:   Constitutional: NAD, calm, comfortable Vitals:   11/27/16 0145 11/27/16 0200 11/27/16 0212 11/27/16 0215  BP: 122/86 126/82  125/85  Pulse: 69 67  65  Resp: 20 18  20   Temp:      TempSrc:      SpO2: 93% 96% 96% 98%  Weight:      Height:       Eyes: PERRL, lids and conjunctivae normal ENMT: Mucous  membranes are moist. Posterior pharynx clear of any exudate or lesions.Normal dentition.  Neck: normal, supple, no masses, no thyromegaly Respiratory: clear to auscultation bilaterally, no wheezing, no crackles. Normal respiratory effort. No accessory muscle use.  Cardiovascular: Regular rate and rhythm, no murmurs / rubs / gallops. No extremity edema. 2+ pedal pulses. No carotid bruits.  Abdomen: no tenderness, no masses palpated. No hepatosplenomegaly. Bowel sounds positive.  Musculoskeletal: no clubbing / cyanosis. Muscle atrophy noted on the left upper extremity with contractures. Left hip has decreased range of motion with tenderness noted. Skin: no rashes, lesions, ulcers. No induration Neurologic: Left-sided hemiparesis Psychiatric: Normal  judgment and insight. Alert and and oriented at least 1. Normal mood.     Labs on Admission: I have personally reviewed following labs and imaging studies  CBC:  Recent Labs Lab 11/27/16 0000  WBC 14.9*  NEUTROABS 9.4*  HGB 15.2  HCT 45.4  MCV 97.6  PLT 174   Basic Metabolic Panel:  Recent Labs Lab 11/27/16 0000  NA 138  K 4.3  CL 102  CO2 24  GLUCOSE 137*  BUN 23*  CREATININE 1.97*  CALCIUM 9.0   GFR: Estimated Creatinine Clearance: 50.2 mL/min (by C-G formula based on SCr of 1.97 mg/dL (H)). Liver Function Tests:  Recent Labs Lab 11/27/16 0000  AST 32  ALT 29  ALKPHOS 82  BILITOT 0.7  PROT 7.1  ALBUMIN 3.1*   No results for input(s): LIPASE, AMYLASE in the last 168 hours. No results for input(s): AMMONIA in the last 168 hours. Coagulation Profile:  Recent Labs Lab 11/27/16 0000  INR 2.31   Cardiac Enzymes:  Recent Labs Lab 11/27/16 0000  TROPONINI 0.03*   BNP (last 3 results) No results for input(s): PROBNP in the last 8760 hours. HbA1C: No results for input(s): HGBA1C in the last 72 hours. CBG: No results for input(s): GLUCAP in the last 168 hours. Lipid Profile: No results for input(s): CHOL,  HDL, LDLCALC, TRIG, CHOLHDL, LDLDIRECT in the last 72 hours. Thyroid Function Tests: No results for input(s): TSH, T4TOTAL, FREET4, T3FREE, THYROIDAB in the last 72 hours. Anemia Panel: No results for input(s): VITAMINB12, FOLATE, FERRITIN, TIBC, IRON, RETICCTPCT in the last 72 hours. Urine analysis:    Component Value Date/Time   COLORURINE AMBER (A) 01/24/2016 1451   APPEARANCEUR CLOUDY (A) 01/24/2016 1451   LABSPEC 1.026 01/24/2016 1451   PHURINE 5.0 01/24/2016 1451   GLUCOSEU NEGATIVE 01/24/2016 1451   HGBUR LARGE (A) 01/24/2016 1451   BILIRUBINUR SMALL (A) 01/24/2016 1451   KETONESUR 15 (A) 01/24/2016 1451   PROTEINUR 100 (A) 01/24/2016 1451   NITRITE NEGATIVE 01/24/2016 1451   LEUKOCYTESUR SMALL (A) 01/24/2016 1451   Sepsis Labs: No results found for this or any previous visit (from the past 240 hour(s)).   Radiological Exams on Admission: Dg Chest 1 View  Result Date: 11/27/2016 CLINICAL DATA:  Larey Seat today.  Left hip fracture. EXAM: CHEST 1 VIEW COMPARISON:  02/24/2016 FINDINGS: Diffuse interstitial coarsening is probably chronic. No consolidation. No effusion. No pneumothorax. New unchanged hilar, mediastinal and cardiac contours. IMPRESSION: Chronic appearing interstitial coarsening. No consolidation or effusion. Electronically Signed   By: Ellery Plunk M.D.   On: 11/27/2016 01:14   Dg Pelvis 1-2 Views  Result Date: 11/27/2016 CLINICAL DATA:  Larey Seat today while trying to get out of bed unassisted. EXAM: PELVIS - 1-2 VIEW COMPARISON:  None. FINDINGS: There is an acute subcapital left hip fracture. No dislocation. No bone lesion or bony destruction. Pelvis appears intact. IMPRESSION: Subcapital left hip fracture Electronically Signed   By: Ellery Plunk M.D.   On: 11/27/2016 01:13   Dg Femur Min 2 Views Left  Result Date: 11/27/2016 CLINICAL DATA:  Larey Seat while attempting to get out of bed unassisted today. EXAM: LEFT FEMUR 2 VIEWS COMPARISON:  None. FINDINGS: There is a  subcapital left hip fracture. Remainder of the femur is intact. No dislocation. No bone lesion or bony destruction. IMPRESSION: Subcapital left hip fracture Electronically Signed   By: Ellery Plunk M.D.   On: 11/27/2016 01:13    EKG: Independently reviewed. Sinus rhythm with multiple PVCs and  incomplete RBBB  Assessment/Plan Left hip fracture 2/2 fall from bed: Acute. Patient reportedly fell out of bed and complained thereaftere of left hip pain. X-ray images show a left subcapital hip fracture. Orthopedic consultation will see the patient in a.m. - Admit to telemetry bed  - Hip fracture order set and initiated - Fentanyl IV prn pain - Orthopedics consulted, follow-up for further recommendations  Leukocytosis: WBC elevated at 14.9. - Check urinalysis - Repeat CBC in a.m.  Elevated troponin: Initial troponin under 0.03. Patient currently denies any chest pain symptoms. - Trend cardiac enzymes  Acute kidney injury:  previously patient's baseline creatinine was noted to be less than 1. However, patient presents with a creatinine 1.97 and a BUN of 23. Question cause at this time. - IV fluids NS at 46ml/hr given previous CHF - Repeat BMP   Systolic CHF: Chronic. Last EF noted to be 25-30% on 01/06/2016. Report unavailable care. Patient presents with elevated BNP of 311. Patient does not appear to be volume overloaded at this time. - Strict ins and outs  - Initially held diuretics secondary to question of dehydration over diuresis  Chronic respiratory failure: Per nursing facility records patient on 3 L nasal Oxygen as needed - Continuous pulse oximetry with nasal cannula oxygen to keep O2 sats greater than 92%   History of apical thrombus on chronic anticoagulation therapy: Last echocardiogram performed 12/2015 did not reveal presence of a thrombus. Patient's INRs. Therapeutic at 2.3 on admission.  - Follow-up with orthopedics in a.m. and determine plan for anticoagulation  CVA with  residual dysphagia and left-sided hemiparesis. Patient noted to have multiple strokes previously in the past. Currently diet is mechanical soft with nectar thick liquids. - CT scan of the brain was obtained due to fall, but noted to be negative.  Diabetes mellitus type 2: Patient's last hemoglobin A1c from 12/2015 was noted to be 5.9.  -  hypoglycemic protocol - CBGs with Sensitive SSI  Essential hypertension - Continue metoprolol  Anxiety - Ativan prn, fluoxetine, buspirone  GERD - continue pepcid  DVT prophylaxis:  Coumadin  Code Status: Full Family Communication:  no family present at bedside  Disposition Plan: TBD Consults called:  orthopedic surgery  Admission status: inpatient  Clydie Braun MD [facility] Pager [phone]  If 7PM-7AM, please contact night-coverage [email or web address] Password [email or web address]  11/27/2016, 2:24 AM

## 2016-11-27 NOTE — Consult Note (Signed)
Cardiology Consult    Patient ID: Curtis Keller MRN: [id number], DOB/AGE: [birth year]   Admit date: 11/26/2016 Date of Consult: 11/27/2016  Primary Physician: Kirt Boys, DO Primary Cardiologist: Truett Mainland, MD - [facility] - pt hasn't seen since 2016. Requesting Provider: Judie Petit. Charlann Boxer, MD  Patient Profile    61 y/o ? with a h/o CAD s/p BMS to the RCA, ICM/systolic CHF, prior CVA, HTN, HL, freq fall, tob abuse, LV thrombus on coumadin, and dementia, who was admitted 2/3 from his SNF with left leg pain after a fall from bed resulting in left femoral neck fx.  We've been asked to provide preop cardiac eval.  Past Medical History   Past Medical History:  Diagnosis Date  . Acute respiratory failure (HCC)    a. Spring 2017 following fall/splenectomy -->admitted to [facility]-->trach/g tube.  . Anemia    a. 12/2015 ABL in setting of fall/hematomas/splenic laceration req splenectomy.  Marland Kitchen Apical mural thrombus    a. 11/2015 Echo: EF 25-30%, moderate size apical thrombus-->coumadin;  b. 12/2015 f/u Echo: EF 25-30%, ant/septa HK, no obvious large thrombus but cannot exclude small mural thrombus.  Marland Kitchen CAD (coronary artery disease)    a. 12/2014 s/p BMS to the RCA.  Marland Kitchen Cerebral infarction (HCC)   . Chronic systolic CHF (congestive heart failure) (HCC)    a. 12/2015 Echo: EF 25-30%.  . Dementia   . Depression   . Dysphagia   . Falls    a. 11/2015 traumatic fall with resultant trauma req splenectomy and prolonged hospitalization complicated by resp failure.  Marland Kitchen History of pneumonia   . Hyperlipidemia   . Hypertension   . Ischemic cardiomyopathy    a. 12/2015 Echo: EF 25-30%, anterior and septal HK.  Marland Kitchen Left hemiplegia (HCC)   . Protein calorie malnutrition (HCC)   . Respiratory failure (HCC)   . Spleen injury   . Status post tracheostomy (HCC)    a. 01/2016 in setting of ongoing resp failure and aspiration.    Past Surgical History:  Procedure Laterality Date  . CARDIAC  CATHETERIZATION    . CORONARY ANGIOPLASTY WITH STENT PLACEMENT    . IR GENERIC HISTORICAL  07/22/2016   IR GASTROSTOMY TUBE REMOVAL 07/22/2016 [name], MD [facility]-INTERV RAD  . PR LAP, SPLENECTOMY  01/07/2016  . TRACHEOSTOMY TUBE PLACEMENT N/A 02/22/2016   Procedure: TRACHEOSTOMY;  Surgeon: [name], MD;  Location: [facility] OR;  Service: ENT;  Laterality: N/A;     Allergies  Allergies  Allergen Reactions  . Codeine Anaphylaxis and Nausea And Vomiting  . Hydrocodone Other (See Comments)    On MAR  . Hydrocodone-Acetaminophen Nausea And Vomiting  . Tegaderm Ag Mesh [Silver] Other (See Comments)    On MAR  . Lisinopril Itching and Rash  . Tylenol [Acetaminophen] Nausea Only    History of Present Illness    61 y/o ? with the above complex PMH including CAD s/p BMS  RCA in 12/2014, ICM w/ chronic systolic CHF (EF 94-70% by echo 12/2015), HTN, HL, LV thrombus (Dx 11/2015 on chronic coumadin), CVA with unsteady gait and freq falls, and mild dementia.  He currently stays @ a SNF and has not f/u with cardiology in roughly 2 years.  He had a prolonged hospitalization in the Spring of 2017 following fall with hematomas and splenic lac req splenectomy.  Post-op course was complicated by ongoing resp failure and aspiration.  During hospitalization, LV thrombus was noted on echo 11/2015 and he was  placed on coumadin. F/u echo in 12/2015 could not r/o small mural thrombus.  He had a prolonged stay @ [facility] and eventually req trach and G tube.  Both have subsequently been discontinued.  He denies any recent cardiac Ss such as c/p, dyspnea, pnd, orthopnea, edema, wt gain, or early satiety.  He apparently fell out of bed late on 2/3 and c/o left hip/leg pain.  He was taken to [facility] where Xray revealed subcapital L hip fx.  He was admitted and seen by ortho with plan for hemiarthroplasty once INR normalized and cleared by cards/medicine.  Pt currently only c/o mild left leg pain.  Inpatient Medications     . atorvastatin  40 mg Oral QHS  . busPIRone  10 mg Oral BID  . divalproex  125 mg Oral TID  . famotidine  20 mg Oral BID  . FLUoxetine  60 mg Oral Daily  . insulin aspart  0-9 Units Subcutaneous TID WC  . ipratropium-albuterol  3 mL Nebulization BID  . metoprolol succinate  25 mg Oral BID  . pregabalin  75 mg Oral BID  . senna  2 tablet Oral BID    Family History    Family History  Problem Relation Age of Onset  . Alcohol abuse Mother   . Hypertension Mother   . Coronary artery disease Mother   . Alcohol abuse Father   . Hypertension Father   . Coronary artery disease Father   . Alcohol abuse Maternal Uncle     Social History    Social History   Social History  . Marital status: Single    Spouse name: N/A  . Number of children: N/A  . Years of education: N/A   Occupational History  . Not on file.   Social History Main Topics  . Smoking status: Former Smoker    Packs/day: 2.00    Years: 38.00    Types: Cigarettes  . Smokeless tobacco: Never Used     Comment: quit spring of 2017.  Marland Kitchen Alcohol use No  . Drug use: No  . Sexual activity: Not on file   Other Topics Concern  . Not on file   Social History Narrative   Lives @ SNF since Spring 2017.  Sedentary.  Unsteady on his feet.  Falls about 1x/wk.     Review of Systems    General:  No chills, fever, night sweats or weight changes.  Cardiovascular:  No chest pain, dyspnea on exertion, edema, orthopnea, palpitations, paroxysmal nocturnal dyspnea. Dermatological: No rash, lesions/masses Respiratory: No cough, dyspnea Urologic: No hematuria, dysuria Abdominal:   No nausea, vomiting, diarrhea, bright red blood per rectum, melena, or hematemesis Neurologic:  No visual changes, wkns, changes in mental status. MSK: left hip/leg pain. All other systems reviewed and are otherwise negative except as noted above.  Physical Exam    Blood pressure 111/70, pulse 64, temperature (!) 100.4 F (38 C), temperature  source Oral, resp. rate 16, height 6\' 2"  (1.88 m), weight 225 lb (102.1 kg), SpO2 93 %.  General: Pleasant, NAD Psych: Normal affect. Neuro: Alert and oriented X 3. Moves all extremities spontaneously. HEENT: Normal  Neck: Supple without bruits or JVD. Lungs:  Resp regular and unlabored, CTA. Heart: Irreg, freq ectopy, no s3, s4, or murmurs. Abdomen: Soft, non-tender, non-distended, BS + x 4.  Extremities: No clubbing, cyanosis or edema. DP/PT/Radials 2+ and equal bilaterally.  Labs    Recent Labs  11/27/16 0000 11/27/16 0346  TROPONINI 0.03* 0.04*  Lab Results  Component Value Date   WBC 14.9 (H) 11/27/2016   HGB 15.2 11/27/2016   HCT 45.4 11/27/2016   MCV 97.6 11/27/2016   PLT 174 11/27/2016    Recent Labs Lab 11/27/16 0000  NA 138  K 4.3  CL 102  CO2 24  BUN 23*  CREATININE 1.97*  CALCIUM 9.0  PROT 7.1  BILITOT 0.7  ALKPHOS 82  ALT 29  AST 32  GLUCOSE 137*    Radiology Studies    Dg Chest 1 View  Result Date: 11/27/2016 CLINICAL DATA:  Larey Seat today.  Left hip fracture. EXAM: CHEST 1 VIEW COMPARISON:  02/24/2016 FINDINGS: Diffuse interstitial coarsening is probably chronic. No consolidation. No effusion. No pneumothorax. New unchanged hilar, mediastinal and cardiac contours. IMPRESSION: Chronic appearing interstitial coarsening. No consolidation or effusion. Electronically Signed   By: Ellery Plunk M.D.   On: 11/27/2016 01:14   Dg Pelvis 1-2 Views  Result Date: 11/27/2016 CLINICAL DATA:  Larey Seat today while trying to get out of bed unassisted. EXAM: PELVIS - 1-2 VIEW COMPARISON:  None. FINDINGS: There is an acute subcapital left hip fracture. No dislocation. No bone lesion or bony destruction. Pelvis appears intact. IMPRESSION: Subcapital left hip fracture Electronically Signed   By: Ellery Plunk M.D.   On: 11/27/2016 01:13   Ct Head Wo Contrast  Result Date: 11/27/2016 CLINICAL DATA:  Larey Seat getting out of bed today, acute LEFT hip fracture. History of  hypertension, stroke. EXAM: CT HEAD WITHOUT CONTRAST TECHNIQUE: Contiguous axial images were obtained from the base of the skull through the vertex without intravenous contrast. COMPARISON:  CT HEAD January 26, 2016 FINDINGS: BRAIN: No intraparenchymal hemorrhage, mass effect, midline shift or acute large vascular territory infarcts. Old RIGHT greater than LEFT cerebellar infarcts, severe brachium contrast atrophy. Old RIGHT greater than LEFT pontine infarcts. Old LEFT thalamus and bilateral basal ganglia lacunar infarcts. Old small RIGHT occipital lobe infarct. LEFT inferior temporal lobe encephalomalacia is likely posttraumatic. Mild to moderate white matter changes compatible with chronic small vessel ischemic disease. No abnormal extra-axial fluid collection. Moderate ventriculomegaly on the basis of global parenchymal brain volume loss. VASCULAR: Mild to moderate calcific atherosclerosis of the carotid siphons. SKULL: No skull fracture. No significant scalp soft tissue swelling. SINUSES/ORBITS: Bilateral maxillary mucosal retention cysts. Mastoid air cells are well aerated. The included ocular globes and orbital contents are non-suspicious. OTHER: Patient is edentulous. IMPRESSION: No acute intracranial process. Stable examination including numerous old supra- and infratentorial infarcts predominately within posterior circulation, moderate global brain atrophy. Electronically Signed   By: Awilda Metro M.D.   On: 11/27/2016 03:01   Dg Femur Min 2 Views Left  Result Date: 11/27/2016 CLINICAL DATA:  Larey Seat while attempting to get out of bed unassisted today. EXAM: LEFT FEMUR 2 VIEWS COMPARISON:  None. FINDINGS: There is a subcapital left hip fracture. Remainder of the femur is intact. No dislocation. No bone lesion or bony destruction. IMPRESSION: Subcapital left hip fracture Electronically Signed   By: Ellery Plunk M.D.   On: 11/27/2016 01:13    ECG & Cardiac Imaging    RSR, 68, pvc's, inc  lbbb.  Assessment & Plan    1.  L Hip Fx/Preoperative Cardiac Evaluation:  Pt with a h/o CAD s/p BMS of the RCA in 12/2014 and ICM/syst CHF with EF of 25-30% by echo in 12/2015.  He was admitted 2/4 after falling out of bed late on 2/3.  Noted to have subcapital L hip fx.  From a  cardiac perspective, he reports no recent c/p, dyspnea, pnd, orthopnea, edema, early satiety, presyncope, or syncope.  Activity is limited @ SNF in the setting of deconditioning and unsteady gait s/p CVA.  He falls frequently.  Given hx, he would be considered moderate risk for cardiac complications perioperative hip surgery.  In the absence of Ss, he does not require ischemic eval beforehand.  Cont  blocker therapy throughout the perioperative period.  2.  CAD:  S/p prior BMS  RCA in 12/2014.  No recent c/p.  See above.  Cont  blocker and statin.  He is not on asa in setting of chronic coumadin.  3.  LV Thrombus:  Noted on echo 11/2015.  Not well appreciated on 12/2015 echo.  He has been on coumadin for at least a year based on records.  He does have ongoing LV dysfxn.  Ultimately, we will need to repeat echo and determine the appropriateness of long term OAC in the gentleman that reports falling at least once/wk and has been hospitalized twice in the past year as a result of those falls.  Last year, he had significant anemia and required splenectomy.  F/u echo.  He says that he prefers to f/u with his cardiologist in [place].  4.  Essential HTN: bp stable on  blocker therapy.  5.  HL:  Cont statin throughout perioperative period.  Signed, Nicolasa Ducking, NP 11/27/2016, 4:33 PM    I have seen and examined the patient along with Nicolasa Ducking, NP.  I have reviewed the chart, notes and new data.  I agree with NP's note.  Key new complaints: he is very cooperative and appears oriented, but at times gives contradictory and unreliable answers (confabulation?). He reports walking at the NH without assistive devices.  Also reports falling about once a week. Does not recall how he fell and had this last injury (fell ou of bed per other reports?). Within the last two years, falls have led to serious complications and surgical intervention. Key examination changes: no evidence of hypervolemia/over heart failure. Occasional PVCs, no atrial fibrillation seen since admission. Key new findings / data: reviewed echo data and records from [place]. Despite a reported previous Rx for Eliquis, cannot find any documentation of atrial fibrillation. Warfarin  Rx'd for LV thrombus seen on echo in [place] ,1 year ago.  PLAN:  Despite a rich history of cardiac problems, he does not have any current evidence for acute ischemia, heart failure decompensation or serious arrhythmia. Moderate risk for perioperative CV complications. High risk for DVT/PE postop, would recommend restarting anticoagulation as soon as possible an continuing until mobility is restored. However, in the long term, he is at very high risk for recurrent falls and life threatening bleeding. Will need to reconsider risk benefit balance of anticoagulation. Recheck echo for LV thrombus  Thurmon Fair, MD, [facility] [phone] 11/27/2016, 4:55 PM

## 2016-11-27 NOTE — ED Notes (Signed)
Smith, MD at bedside.  

## 2016-11-27 NOTE — Progress Notes (Addendum)
[facility] PLAN OF CARE NOTE Patient: Curtis Keller MYT:[id number]   PCP: Kirt Boys, DO DOB: Jan 16, 1956   DOA: 11/26/2016   DOS: 11/27/2016    Patient was admitted by my colleague Dr. Katrinka Blazing earlier on 11/27/2016. I have reviewed the H&P as well as assessment and plan and agree with the same. Important changes in the plan are listed below.  Plan of care: 1.Preoperative medical evaluation CVA 11/2015  A) Cardiac risk: Based on RCRI 11 % Risk of Major Cardiac Event >History of ischemic heart disease  >History of HF >History of cerebrovascular disease >Diabetes mellitus requiring treatment with insulin  Chales Abrahams Perioperative Cardiac Risk: Estimated Risk Probability for Perioperative Myocardial Infarction or Cardiac Arrest 1.96 %  With this the patient is a high risk for adverse Cardiac outcome from surgery. Recommend further work up with echocardiogram and cardiology consultation prior to surgery.  Be watchful of hydration since the pt has history of CHF. Monitor Ins and Out. Continue to hold warfarin, may need heparin once INR less then 2 Hold diuretics.  B) Pulmonary risk: The pt has history of Chronic respiratory failure Recommend optimization of lung function with use of incentive spirometry and good pulmunary toilet. continue use of nebulizer/inhaler.  C) Bleeding risk On warfarin, and will need bridging due to h/o apical thrombus, will remain high risk for bleeding.  2. Discussed with the patient's sister who is POA, she initially requested to transfer the patient to [place], I offered assistance recommended that she has to initiate the case and we will discuss with providers there. After discussing the plan of care she decided to continue with current care here at [facility]. All questions are answered satisfactorily  Author: Lynden Oxford, MD [facility] Pager: [phone] 11/27/2016 8:24 AM   If 7PM-7AM, please contact night-coverage at  [email or web address], password [facility]

## 2016-11-27 NOTE — ED Notes (Signed)
Patient transported to CT 

## 2016-11-27 NOTE — Progress Notes (Signed)
Hypoglycemic Event  CBG: CBG 37  Treatment: 4 oz orange juice, graham crackers  Symptoms: drowsy, sweating  Follow-up CBG: Time: 2215   CBG Result: 223  Possible Reasons for Event: poor appetite  Comments/MD notified: Nursing will continue to monitor.   Curtis Keller

## 2016-11-28 ENCOUNTER — Other Ambulatory Visit (HOSPITAL_COMMUNITY): Payer: Medicare Other

## 2016-11-28 ENCOUNTER — Encounter (HOSPITAL_COMMUNITY)
Admission: EM | Disposition: A | Discharge status: Skilled Nursing Facility | Payer: Self-pay | Source: Home / Self Care | Attending: Internal Medicine

## 2016-11-28 ENCOUNTER — Inpatient Hospital Stay (HOSPITAL_COMMUNITY): Payer: Medicare Other

## 2016-11-28 DIAGNOSIS — J9621 Acute and chronic respiratory failure with hypoxia: Secondary | ICD-10-CM | POA: Diagnosis not present

## 2016-11-28 DIAGNOSIS — I5030 Unspecified diastolic (congestive) heart failure: Secondary | ICD-10-CM

## 2016-11-28 DIAGNOSIS — I255 Ischemic cardiomyopathy: Secondary | ICD-10-CM

## 2016-11-28 DIAGNOSIS — J690 Pneumonitis due to inhalation of food and vomit: Secondary | ICD-10-CM

## 2016-11-28 LAB — BLOOD GAS, ARTERIAL
Acid-Base Excess: 2.3 mmol/L — ABNORMAL HIGH (ref 0.0–2.0)
BICARBONATE: 26.2 mmol/L (ref 20.0–28.0)
DRAWN BY: 275531
O2 CONTENT: 5 L/min
O2 Saturation: 95.4 %
PATIENT TEMPERATURE: 98.6
PH ART: 7.43 (ref 7.350–7.450)
pCO2 arterial: 40.2 mmHg (ref 32.0–48.0)
pO2, Arterial: 75.5 mmHg — ABNORMAL LOW (ref 83.0–108.0)

## 2016-11-28 LAB — LACTIC ACID, PLASMA
LACTIC ACID, VENOUS: 1.9 mmol/L (ref 0.5–1.9)
LACTIC ACID, VENOUS: 1.9 mmol/L (ref 0.5–1.9)

## 2016-11-28 LAB — ECHOCARDIOGRAM COMPLETE
CHL CUP DOP CALC LVOT VTI: 19.6 cm
EERAT: 7.17
Height: 74 in
LA vol A4C: 81.1 ml
LAVOL: 80.9 mL
LAVOLIN: 34.8 mL/m2
LV E/e' medial: 7.17
LV e' LATERAL: 8.16 cm/s
LVEEAVG: 7.17
LVOT area: 4.15 cm2
LVOT peak grad rest: 6 mmHg
LVOTD: 23 mm
LVOTPV: 118 cm/s
LVOTSV: 81 mL
MV pk A vel: 102 m/s
MV pk E vel: 58.5 m/s
RV LATERAL S' VELOCITY: 20.9 cm/s
TAPSE: 28.7 mm
TDI e' lateral: 8.16
TDI e' medial: 5
Weight: 3600 oz

## 2016-11-28 LAB — CBC WITH DIFFERENTIAL/PLATELET
BASOS PCT: 0 %
Basophils Absolute: 0 10*3/uL (ref 0.0–0.1)
EOS ABS: 0.9 10*3/uL — AB (ref 0.0–0.7)
Eosinophils Relative: 7 %
HCT: 44.9 % (ref 39.0–52.0)
Hemoglobin: 15.6 g/dL (ref 13.0–17.0)
Lymphocytes Relative: 22 %
Lymphs Abs: 3 10*3/uL (ref 0.7–4.0)
MCH: 33.9 pg (ref 26.0–34.0)
MCHC: 34.7 g/dL (ref 30.0–36.0)
MCV: 97.6 fL (ref 78.0–100.0)
MONOS PCT: 12 %
Monocytes Absolute: 1.7 10*3/uL — ABNORMAL HIGH (ref 0.1–1.0)
NEUTROS PCT: 60 %
Neutro Abs: 8.4 10*3/uL — ABNORMAL HIGH (ref 1.7–7.7)
Platelets: 131 10*3/uL — ABNORMAL LOW (ref 150–400)
RBC: 4.6 MIL/uL (ref 4.22–5.81)
RDW: 14.7 % (ref 11.5–15.5)
WBC: 14 10*3/uL — AB (ref 4.0–10.5)

## 2016-11-28 LAB — COMPREHENSIVE METABOLIC PANEL
ALBUMIN: 2.6 g/dL — AB (ref 3.5–5.0)
ALT: 18 U/L (ref 17–63)
ANION GAP: 12 (ref 5–15)
AST: 22 U/L (ref 15–41)
Alkaline Phosphatase: 70 U/L (ref 38–126)
BUN: 26 mg/dL — ABNORMAL HIGH (ref 6–20)
CALCIUM: 8.7 mg/dL — AB (ref 8.9–10.3)
CHLORIDE: 104 mmol/L (ref 101–111)
CO2: 22 mmol/L (ref 22–32)
Creatinine, Ser: 1.58 mg/dL — ABNORMAL HIGH (ref 0.61–1.24)
GFR calc Af Amer: 53 mL/min — ABNORMAL LOW (ref >60)
GFR calc non Af Amer: 46 mL/min — ABNORMAL LOW (ref >60)
GLUCOSE: 118 mg/dL — AB (ref 65–99)
Potassium: 4.5 mmol/L (ref 3.5–5.1)
SODIUM: 138 mmol/L (ref 135–145)
Total Bilirubin: 0.5 mg/dL (ref 0.3–1.2)
Total Protein: 7 g/dL (ref 6.5–8.1)

## 2016-11-28 LAB — BRAIN NATRIURETIC PEPTIDE: B NATRIURETIC PEPTIDE 5: 355.6 pg/mL — AB (ref 0.0–100.0)

## 2016-11-28 LAB — GLUCOSE, CAPILLARY
GLUCOSE-CAPILLARY: 151 mg/dL — AB (ref 65–99)
GLUCOSE-CAPILLARY: 172 mg/dL — AB (ref 65–99)
GLUCOSE-CAPILLARY: 191 mg/dL — AB (ref 65–99)
Glucose-Capillary: 111 mg/dL — ABNORMAL HIGH (ref 65–99)

## 2016-11-28 LAB — PROTIME-INR
INR: 2.49
PROTHROMBIN TIME: 27.4 s — AB (ref 11.4–15.2)

## 2016-11-28 LAB — MAGNESIUM: Magnesium: 1.8 mg/dL (ref 1.7–2.4)

## 2016-11-28 LAB — PROCALCITONIN: Procalcitonin: 0.56 ng/mL

## 2016-11-28 LAB — HIV ANTIBODY (ROUTINE TESTING W REFLEX): HIV Screen 4th Generation wRfx: NONREACTIVE

## 2016-11-28 SURGERY — HEMIARTHROPLASTY, HIP, DIRECT ANTERIOR APPROACH, FOR FRACTURE
Anesthesia: General | Laterality: Left

## 2016-11-28 MED ORDER — PIPERACILLIN-TAZOBACTAM 3.375 G IVPB
3.3750 g | Freq: Three times a day (TID) | INTRAVENOUS | Status: AC
  Administered 2016-11-28 – 2016-12-04 (×19): 3.375 g via INTRAVENOUS
  Filled 2016-11-28 (×23): qty 50

## 2016-11-28 MED ORDER — PIPERACILLIN-TAZOBACTAM 3.375 G IVPB
3.3750 g | Freq: Three times a day (TID) | INTRAVENOUS | Status: DC
  Filled 2016-11-28 (×2): qty 50

## 2016-11-28 MED ORDER — METHOCARBAMOL 500 MG PO TABS
500.0000 mg | ORAL_TABLET | Freq: Three times a day (TID) | ORAL | Status: DC | PRN
  Administered 2016-11-30 – 2016-12-08 (×3): 500 mg via ORAL
  Filled 2016-11-28 (×3): qty 1

## 2016-11-28 MED ORDER — CEFAZOLIN SODIUM-DEXTROSE 2-4 GM/100ML-% IV SOLN: 2.0000 g | INTRAVENOUS | Status: DC

## 2016-11-28 MED ORDER — MORPHINE SULFATE (PF) 2 MG/ML IV SOLN
1.0000 mg | INTRAVENOUS | Status: DC | PRN
  Administered 2016-11-29 (×2): 1 mg via INTRAVENOUS
  Administered 2016-11-29 – 2016-12-03 (×11): 2 mg via INTRAVENOUS
  Filled 2016-11-28 (×12): qty 1

## 2016-11-28 MED ORDER — SODIUM CHLORIDE 0.9 % IV SOLN: INTRAVENOUS | Status: DC

## 2016-11-28 MED ORDER — METHYLPREDNISOLONE SODIUM SUCC 125 MG IJ SOLR
60.0000 mg | Freq: Two times a day (BID) | INTRAMUSCULAR | Status: DC
  Administered 2016-11-28 – 2016-11-30 (×4): 60 mg via INTRAVENOUS
  Filled 2016-11-28 (×3): qty 2

## 2016-11-28 MED ORDER — FUROSEMIDE 10 MG/ML IJ SOLN
20.0000 mg | Freq: Once | INTRAMUSCULAR | Status: AC
  Administered 2016-11-29: 20 mg via INTRAVENOUS
  Filled 2016-11-28: qty 2

## 2016-11-28 MED ORDER — METOPROLOL SUCCINATE ER 25 MG PO TB24
25.0000 mg | ORAL_TABLET | Freq: Every day | ORAL | Status: DC
  Administered 2016-11-28 – 2016-12-13 (×16): 25 mg via ORAL
  Filled 2016-11-28 (×16): qty 1

## 2016-11-28 MED ORDER — SENNOSIDES-DOCUSATE SODIUM 8.6-50 MG PO TABS
1.0000 | ORAL_TABLET | Freq: Two times a day (BID) | ORAL | Status: DC
  Administered 2016-11-28 – 2016-12-13 (×29): 1 via ORAL
  Filled 2016-11-28 (×30): qty 1

## 2016-11-28 MED ORDER — POVIDONE-IODINE 10 % EX SWAB: 2.0000 "application " | Freq: Once | CUTANEOUS | Status: DC

## 2016-11-28 MED ORDER — METHYLPREDNISOLONE SODIUM SUCC 125 MG IJ SOLR
INTRAMUSCULAR | Status: AC
  Administered 2016-11-28: 60 mg via INTRAVENOUS
  Filled 2016-11-28: qty 2

## 2016-11-28 MED ORDER — PERFLUTREN LIPID MICROSPHERE
1.0000 mL | INTRAVENOUS | Status: AC | PRN
  Administered 2016-11-28: 2 mL via INTRAVENOUS
  Filled 2016-11-28: qty 10

## 2016-11-28 MED ORDER — POLYETHYLENE GLYCOL 3350 17 G PO PACK
17.0000 g | PACK | Freq: Every day | ORAL | Status: DC
  Administered 2016-11-29 – 2016-12-12 (×12): 17 g via ORAL
  Filled 2016-11-28 (×13): qty 1

## 2016-11-28 MED ORDER — PREGABALIN 50 MG PO CAPS
75.0000 mg | ORAL_CAPSULE | Freq: Every day | ORAL | Status: DC
  Administered 2016-11-29 – 2016-12-13 (×15): 75 mg via ORAL
  Filled 2016-11-28 (×14): qty 1

## 2016-11-28 MED ORDER — FUROSEMIDE 10 MG/ML IJ SOLN: 40.0000 mg | Freq: Once | INTRAMUSCULAR | Status: DC

## 2016-11-28 MED ORDER — CHLORHEXIDINE GLUCONATE 4 % EX LIQD: 60.0000 mL | Freq: Once | CUTANEOUS | Status: DC

## 2016-11-28 MED ORDER — IPRATROPIUM-ALBUTEROL 0.5-2.5 (3) MG/3ML IN SOLN
3.0000 mL | Freq: Four times a day (QID) | RESPIRATORY_TRACT | Status: DC
  Administered 2016-11-28 – 2016-12-01 (×13): 3 mL via RESPIRATORY_TRACT
  Filled 2016-11-28 (×13): qty 3

## 2016-11-28 MED ORDER — VANCOMYCIN HCL IN DEXTROSE 750-5 MG/150ML-% IV SOLN
750.0000 mg | Freq: Two times a day (BID) | INTRAVENOUS | Status: DC
  Filled 2016-11-28 (×2): qty 150

## 2016-11-28 MED ORDER — ASPIRIN EC 81 MG PO TBEC
81.0000 mg | DELAYED_RELEASE_TABLET | Freq: Every day | ORAL | Status: DC
  Administered 2016-11-29 – 2016-12-13 (×15): 81 mg via ORAL
  Filled 2016-11-28 (×15): qty 1

## 2016-11-28 MED ORDER — OXYCODONE-ACETAMINOPHEN 5-325 MG PO TABS
1.0000 | ORAL_TABLET | Freq: Four times a day (QID) | ORAL | Status: DC | PRN
  Administered 2016-11-30 – 2016-12-08 (×13): 1 via ORAL
  Filled 2016-11-28 (×13): qty 1

## 2016-11-28 MED ORDER — VANCOMYCIN HCL IN DEXTROSE 750-5 MG/150ML-% IV SOLN
750.0000 mg | Freq: Two times a day (BID) | INTRAVENOUS | Status: DC
  Administered 2016-11-28 – 2016-12-01 (×6): 750 mg via INTRAVENOUS
  Filled 2016-11-28 (×9): qty 150

## 2016-11-28 NOTE — Progress Notes (Signed)
Called to 5N to assess pt. RN c/o of pt SPO2 85%.  Pt Curtis Keller increased to 5L SPO2 increased 94%.  Pt alert and oriented.  ABG ordered by MD

## 2016-11-28 NOTE — Significant Event (Signed)
Rapid Response Event Note  Overview: Time Called: 1733 Arrival Time: 1735 Event Type: Respiratory  Initial Focused Assessment: Rapid Response team paged to room for [place] 85% on 4L [place] Upon arrival to room patient O2 sat 86% on 4l [place]  Patient is alert and able to speak in full sentences, able to cough and deep breath.  Scattered Rhonchi, clears with cough. Per MD PCXR with bilat PNA and probable aspiration earlier today or last night.  Interventions: O2 increased to 6l [place], O2 sats improved to 95% Weaned O2 to 4l   O2 sats remain 92% ABG done Solumedrol given IV  Plan of Care (if not transferred): Rn to call if assistance needed prior to transfer.  Event Summary: Name of Physician Notified: Dr Allena Katz at bedside at      at    Outcome: Transferred (Comment)  Event End Time: 1810  Curtis Keller

## 2016-11-28 NOTE — Progress Notes (Addendum)
[facility] Progress Note  Patient: Curtis Keller BPP:[id number]   PCP: Kirt Boys, DO DOB: 06-27-1956   DOA: 11/26/2016   DOS: 11/28/2016   Date of Service: the patient was seen and examined on 11/28/2016   Subjective: Patient is denying any acute complaint although he has visible respiratory distress. No chest pain and abdominal pain no nausea no vomiting.  Brief hospital course: Pt. with PMH of HTN, chronic systolic CHF, chronic respiratory failure on 3 L of oxygen, diabetes mellitus, recurrent fall, chronic anticoagulation, dyslipidemia; admitted on 11/26/2016, with complaint of A mechanical fall, was found to have closed left hip fracture. Patient developed aspiration pneumonia in the hospital. Cardiology consulted for preop clearance. Orthopedics consulted as well. Currently surgery is on hold until pneumonia is better. Currently further plan is continue IV antibiotics.  Assessment and Plan: 1. Closed left hip fracture [facility]) Appreciate orthopedic input. Patient recommended to go for surgery once hemodynamically stable. In the setting of worsening pneumonia I'm recommending to currently hold off on the surgery for at least next 48 hours. Earliest safe attempt for the surgery would be on Thursday 12/01/2016.  2. Aspiration pneumonia, healthcare associated pneumonia. Acute on chronic respiratory failure Bilateral with progressively worsening. Currently on 4 L of oxygen, at his baseline on 3 L. expiratory wheezing. Treating with IV vancomycin and IV Zosyn. Blood cultures performed. Pneumonia order set used. Last order for diet was soft diet with nectar thick consistency, Per RN Patient was receiving soft diet here in the hospital, he is on dysphagia type III diet with nectar thick consistency at SNF. as per his sister, pt has also received on and off thin liquids by herself in the SNF. Consultants speech therapy. Mucinex as well as started was ordered. Also started on IV  steroids 60 mg every 12 hours. Will need close monitoring and low threshold to transfer to step down unit as the patient has history of splenectomy which makes him susceptible to routine organisms causing severe sepsis. Influenza PCR negative Addendum: RN mentions that patient's saturation is dropped to 85% on 4 L. Check ABG, 20 mg of IV Lasix, transferred to step down Sallie Staron 5:32 PM 11/28/2016    3. Chronic systolic CHF. History of apical thrombus Coronary artery disease. CVA with residual left-sided weakness. INR currently therapeutic. We will not reverse it and let it trend down on its own since the surgery is not planned for next 2 days. With the absence of apical thrombus on the echocardiogram and history of recurrent fall patient would be a poor candidate for chronic anticoagulation and therefore recommending to stop and examination. Recommend to start the patient on 81 mg aspirin given his history of coronary artery disease. Currently continuing Toprol-XL. BNP 355, mild elevation from baseline of 310. Close monitoring.  4. GERD. Continue Pepcid.  5. History of anxiety. Continue Prozac.  6. Type 2 diabetes mellitus. Continue sliding scale at present.  Bowel regimen: last BM prior to admission Diet: Dysphagia type III diet with nectar thick liquids, speech therapy consulted DVT Prophylaxis: on therapeutic anticoagulation.  Advance goals of care discussion: full code, as discussed with patient's sister  Family Communication: family was present at bedside, at the time of interview. The pt provided permission to discuss medical plan with the family. Opportunity was given to ask question and all questions were answered satisfactorily.   Disposition:  Discharge to SNF. Expected discharge date: 12/02/2016, stabilization of respiratory issue as well as postoperative recovery  Consultants: Cardiology, orthopedics Procedures:  Echocardiogram  Antibiotics: Anti-infectives      Start     Dose/Rate Route Frequency Ordered Stop   11/28/16 1530  piperacillin-tazobactam (ZOSYN) IVPB 3.375 g     3.375 g 12.5 mL/hr over 240 Minutes Intravenous Every 8 hours 11/28/16 1518     11/28/16 1530  vancomycin (VANCOCIN) IVPB 750 mg/150 ml premix     750 mg 150 mL/hr over 60 Minutes Intravenous Every 12 hours 11/28/16 1518     11/28/16 1520  ceFAZolin (ANCEF) IVPB 2g/100 mL premix     2 g 200 mL/hr over 30 Minutes Intravenous To [facility] 11/28/16 1247 11/29/16 1530   11/28/16 1000  piperacillin-tazobactam (ZOSYN) IVPB 3.375 g  Status:  Discontinued     3.375 g 12.5 mL/hr over 240 Minutes Intravenous Every 8 hours 11/28/16 0832 11/28/16 1518   11/28/16 1000  vancomycin (VANCOCIN) IVPB 750 mg/150 ml premix  Status:  Discontinued     750 mg 150 mL/hr over 60 Minutes Intravenous Every 12 hours 11/28/16 0832 11/28/16 1518        Objective: Physical Exam: Vitals:   11/28/16 0432 11/28/16 0931 11/28/16 1120 11/28/16 1500  BP: 113/68  121/70 118/66  Pulse: 67  79 77  Resp: 18   18  Temp: 99.5 F (37.5 C)   98.7 F (37.1 C)  TempSrc: Oral   Oral  SpO2: 95% 91%  93%  Weight:      Height:        Intake/Output Summary (Last 24 hours) at 11/28/16 1644 Last data filed at 11/28/16 1500  Gross per 24 hour  Intake              480 ml  Output             2000 ml  Net            -1520 ml   Filed Weights   11/26/16 2359  Weight: 102.1 kg (225 lb)    General: Alert, Awake and Oriented to Time, Place and Person. Appear in mild distress, affect appropriate Eyes: PERRL Conjunctiva normal ENT: Oral Mucosa clear moist. Neck: no JVD, no Abnormal Mass Or lumps Cardiovascular: S1 and S2 Present, aortic systolic Murmur, Respiratory: Bilateral Air entry equal and Decreased, no use of accessory muscle, bilateral Crackles, exp wheezes Abdomen: Bowel Sound present, Soft and no tenderness Skin: no redness, no Rash, no induration Extremities: no Pedal edema, no calf  tenderness Neurologic: Grossly no focal neuro deficit. Bilaterally Equal motor strength  Data Reviewed: CBC:  Recent Labs Lab 11/27/16 0000 11/28/16 0838  WBC 14.9* 14.0*  NEUTROABS 9.4* 8.4*  HGB 15.2 15.6  HCT 45.4 44.9  MCV 97.6 97.6  PLT 174 131*   Basic Metabolic Panel:  Recent Labs Lab 11/27/16 0000 11/27/16 2021 11/28/16 0838 11/28/16 1012  NA 138 142 138  --   K 4.3 4.1 4.5  --   CL 102 100* 104  --   CO2 24 23 22   --   GLUCOSE 137* 37* 118*  --   BUN 23* 25* 26*  --   CREATININE 1.97* 1.61* 1.58*  --   CALCIUM 9.0 9.2 8.7*  --   MG  --   --   --  1.8    Liver Function Tests:  Recent Labs Lab 11/27/16 0000 11/28/16 0838  AST 32 22  ALT 29 18  ALKPHOS 82 70  BILITOT 0.7 0.5  PROT 7.1 7.0  ALBUMIN 3.1* 2.6*   No results  for input(s): LIPASE, AMYLASE in the last 168 hours. No results for input(s): AMMONIA in the last 168 hours. Coagulation Profile:  Recent Labs Lab 11/27/16 0000  INR 2.31   Cardiac Enzymes:  Recent Labs Lab 11/27/16 0000 11/27/16 0346 11/27/16 1557 11/27/16 2022  TROPONINI 0.03* 0.04* 0.03* 0.05*   BNP (last 3 results) No results for input(s): PROBNP in the last 8760 hours.  CBG:  Recent Labs Lab 11/27/16 2215 11/27/16 2338 11/28/16 0823 11/28/16 1223 11/28/16 1633  GLUCAP 223* 233* 111* 151* 172*    Studies: Dg Chest Port 1 View  Result Date: 11/28/2016 CLINICAL DATA:  Shortness of breath. EXAM: PORTABLE CHEST 1 VIEW COMPARISON:  11/27/2016 and 02/24/2016 FINDINGS: The patient has progressive bilateral pulmonary infiltrates which may represent pulmonary edema or progressive pneumonia. Pulmonary vascularity appears normal in the overall heart size is normal. IMPRESSION: Probable progressive bilateral pneumonia. Electronically Signed   By: Francene Boyers M.D.   On: 11/28/2016 15:31   Dg Chest Port 1 View  Result Date: 11/27/2016 CLINICAL DATA:  sob EXAM: PORTABLE CHEST 1 VIEW COMPARISON:  11/27/2016 FINDINGS:  The heart is enlarged. There are airspace filling opacities bilaterally, particularly well seen at the left lung base. Patient is rotated. IMPRESSION: Bilateral airspace filling opacities consistent with pulmonary edema and/or infectious process. Electronically Signed   By: Norva Pavlov M.D.   On: 11/27/2016 22:17     Scheduled Meds: . atorvastatin  40 mg Oral QHS  . busPIRone  10 mg Oral BID  .  ceFAZolin (ANCEF) IV  2 g Intravenous To SS-Surg  . chlorhexidine  60 mL Topical Once  . Chlorhexidine Gluconate Cloth  6 each Topical Q0600  . divalproex  125 mg Oral TID  . famotidine  20 mg Oral BID  . FLUoxetine  60 mg Oral Daily  . insulin aspart  0-9 Units Subcutaneous TID WC  . ipratropium-albuterol  3 mL Nebulization BID  . metoprolol succinate  25 mg Oral Daily  . mupirocin ointment  1 application Nasal BID  . piperacillin-tazobactam (ZOSYN)  IV  3.375 g Intravenous Q8H  . polyethylene glycol  17 g Oral Daily  . povidone-iodine  2 application Topical Once  . pregabalin  75 mg Oral BID  . senna-docusate  1 tablet Oral BID  . vancomycin  750 mg Intravenous Q12H   Continuous Infusions: . sodium chloride     PRN Meds: acetaminophen, fentaNYL (SUBLIMAZE) injection, LORazepam, oxyCODONE  Time spent: 35 minutes  Author: Lynden Oxford, MD [facility] Pager: [phone] 11/28/2016 4:44 PM  If 7PM-7AM, please contact night-coverage at [email or web address], password [facility]

## 2016-11-28 NOTE — Progress Notes (Signed)
Patient ID: Curtis Keller, male   DOB: 08/19/1956, 61 y.o.   MRN: [id number]  Seen and evaluated by medicine and cardiology related to his complex medical history. He is noted to be moderate to high risk but benefits of hemiarthroplasty seem better than non-operative management  At this point he has been placed on the OR schedule for today NPO Consent ordered  post op plan determined based on recovery and progress with therapy Hope would be to return to SNF after a few days of observation and assuring as stable as possible

## 2016-11-28 NOTE — Progress Notes (Signed)
  Echocardiogram 2D Echocardiogram has been performed with definity.  Curtis Keller 11/28/2016, 10:47 AM

## 2016-11-28 NOTE — Progress Notes (Signed)
Pharmacy Antibiotic Note  Curtis Keller is a 61 y.o. male admitted on 11/26/2016 with pneumonia.    Plan: Add zosyn 3.375 gm iv q8h Add vancomycin 750 mg q12h Monitor renal fx, cx, vt prn  Height: 6\' 2"  (188 cm) Weight: 225 lb (102.1 kg) IBW/kg (Calculated) : 82.2  Temp (24hrs), Avg:100.6 F (38.1 C), Min:98.1 F (36.7 C), Max:103.2 F (39.6 C)   Recent Labs Lab 11/27/16 0000 11/27/16 2021  WBC 14.9*  --   CREATININE 1.97* 1.61*    Estimated Creatinine Clearance: 61.5 mL/min (by C-G formula based on SCr of 1.61 mg/dL (H)).    Allergies  Allergen Reactions  . Codeine Anaphylaxis and Nausea And Vomiting  . Hydrocodone Other (See Comments)    On MAR  . Hydrocodone-Acetaminophen Nausea And Vomiting  . Tegaderm Ag Mesh [Silver] Other (See Comments)    On MAR  . Lisinopril Itching and Rash  . Tylenol [Acetaminophen] Nausea Only    Isaac Bliss, PharmD, BCPS, BCCCP Clinical Pharmacist Clinical phone for 11/28/2016 from 7a-3:30p: [phone] If after 3:30p, please call main pharmacy at: [phone] 11/28/2016 8:32 AM

## 2016-11-28 NOTE — Progress Notes (Signed)
Progress Note  Patient Name: Curtis Keller Date of Encounter: 11/28/2016  Primary Cardiologist: Dr. Desma Maxim [facility] [place])  Subjective   Leg pain, but no chest pain/dyspnea.  Inpatient Medications    Scheduled Meds: . atorvastatin  40 mg Oral QHS  . busPIRone  10 mg Oral BID  .  ceFAZolin (ANCEF) IV  2 g Intravenous To SS-Surg  . chlorhexidine  60 mL Topical Once  . Chlorhexidine Gluconate Cloth  6 each Topical Q0600  . divalproex  125 mg Oral TID  . famotidine  20 mg Oral BID  . FLUoxetine  60 mg Oral Daily  . insulin aspart  0-9 Units Subcutaneous TID WC  . ipratropium-albuterol  3 mL Nebulization BID  . metoprolol succinate  25 mg Oral Daily  . mupirocin ointment  1 application Nasal BID  . piperacillin-tazobactam (ZOSYN)  IV  3.375 g Intravenous Q8H  . polyethylene glycol  17 g Oral Daily  . povidone-iodine  2 application Topical Once  . pregabalin  75 mg Oral BID  . senna-docusate  1 tablet Oral BID  . vancomycin  750 mg Intravenous Q12H   Continuous Infusions: . sodium chloride     PRN Meds: acetaminophen, fentaNYL (SUBLIMAZE) injection, LORazepam, oxyCODONE   Vital Signs    Vitals:   11/28/16 0110 11/28/16 0432 11/28/16 0931 11/28/16 1120  BP:  113/68  121/70  Pulse:  67  79  Resp:  18    Temp: 98.1 F (36.7 C) 99.5 F (37.5 C)    TempSrc: Oral Oral    SpO2:  95% 91%   Weight:      Height:        Intake/Output Summary (Last 24 hours) at 11/28/16 1341 Last data filed at 11/28/16 1100  Gross per 24 hour  Intake              240 ml  Output             2000 ml  Net            -1760 ml   Filed Weights   11/26/16 2359  Weight: 225 lb (102.1 kg)    Telemetry    Sr with PVCs - Personally Reviewed  ECG    SR, incomplete LBBB with PVCs - Personally Reviewed  Physical Exam   GEN: Disheveled, WM  No acute distress.   Neck: No JVD Cardiac: RRR, no murmurs, rubs, or gallops.  Respiratory: Clear to auscultation bilaterally. GI: Soft,  nontender, non-distended  MS: No edema; No deformity. Left leg flexed, DP slightly reduced. +1 on left. Neuro:  Nonfocal  Psych: Normal affect   Labs    Chemistry Recent Labs Lab 11/27/16 0000 11/27/16 2021 11/28/16 0838  NA 138 142 138  K 4.3 4.1 4.5  CL 102 100* 104  CO2 GLUCOSE 137* 37* 118*  BUN 23* 25* 26*  CREATININE 1.97* 1.61* 1.58*  CALCIUM 9.0 9.2 8.7*  PROT 7.1  --  7.0  ALBUMIN 3.1*  --  2.6*  AST 32  --  22  ALT 29  --  18  ALKPHOS 82  --  70  BILITOT 0.7  --  0.5  GFRNONAA 35* 45* 46*  GFRAA 40* 52* 53*  ANIONGAP 12 19* 12     Hematology Recent Labs Lab 11/27/16 0000 11/28/16 0838  WBC 14.9* 14.0*  RBC 4.65 4.60  HGB 15.2 15.6  HCT 45.4 44.9  MCV 97.6 97.6  MCH  32.7 33.9  MCHC 33.5 34.7  RDW 14.6 14.7  PLT 174 131*    Cardiac Enzymes Recent Labs Lab 11/27/16 0000 11/27/16 0346 11/27/16 1557 11/27/16 2022  TROPONINI 0.03* 0.04* 0.03* 0.05*   No results for input(s): TROPIPOC in the last 168 hours.   BNP Recent Labs Lab 11/27/16 0000  BNP 311.3*     DDimer No results for input(s): DDIMER in the last 168 hours.   Radiology    Dg Chest 1 View  Result Date: 11/27/2016 CLINICAL DATA:  Larey Seat today.  Left hip fracture. EXAM: CHEST 1 VIEW COMPARISON:  02/24/2016 FINDINGS: Diffuse interstitial coarsening is probably chronic. No consolidation. No effusion. No pneumothorax. New unchanged hilar, mediastinal and cardiac contours. IMPRESSION: Chronic appearing interstitial coarsening. No consolidation or effusion. Electronically Signed   By: Ellery Plunk M.D.   On: 11/27/2016 01:14   Dg Pelvis 1-2 Views  Result Date: 11/27/2016 CLINICAL DATA:  Larey Seat today while trying to get out of bed unassisted. EXAM: PELVIS - 1-2 VIEW COMPARISON:  None. FINDINGS: There is an acute subcapital left hip fracture. No dislocation. No bone lesion or bony destruction. Pelvis appears intact. IMPRESSION: Subcapital left hip fracture Electronically Signed    By: Ellery Plunk M.D.   On: 11/27/2016 01:13   Ct Head Wo Contrast  Result Date: 11/27/2016 CLINICAL DATA:  Larey Seat getting out of bed today, acute LEFT hip fracture. History of hypertension, stroke. EXAM: CT HEAD WITHOUT CONTRAST TECHNIQUE: Contiguous axial images were obtained from the base of the skull through the vertex without intravenous contrast. COMPARISON:  CT HEAD January 26, 2016 FINDINGS: BRAIN: No intraparenchymal hemorrhage, mass effect, midline shift or acute large vascular territory infarcts. Old RIGHT greater than LEFT cerebellar infarcts, severe brachium contrast atrophy. Old RIGHT greater than LEFT pontine infarcts. Old LEFT thalamus and bilateral basal ganglia lacunar infarcts. Old small RIGHT occipital lobe infarct. LEFT inferior temporal lobe encephalomalacia is likely posttraumatic. Mild to moderate white matter changes compatible with chronic small vessel ischemic disease. No abnormal extra-axial fluid collection. Moderate ventriculomegaly on the basis of global parenchymal brain volume loss. VASCULAR: Mild to moderate calcific atherosclerosis of the carotid siphons. SKULL: No skull fracture. No significant scalp soft tissue swelling. SINUSES/ORBITS: Bilateral maxillary mucosal retention cysts. Mastoid air cells are well aerated. The included ocular globes and orbital contents are non-suspicious. OTHER: Patient is edentulous. IMPRESSION: No acute intracranial process. Stable examination including numerous old supra- and infratentorial infarcts predominately within posterior circulation, moderate global brain atrophy. Electronically Signed   By: Awilda Metro M.D.   On: 11/27/2016 03:01   Dg Chest Port 1 View  Result Date: 11/27/2016 CLINICAL DATA:  sob EXAM: PORTABLE CHEST 1 VIEW COMPARISON:  11/27/2016 FINDINGS: The heart is enlarged. There are airspace filling opacities bilaterally, particularly well seen at the left lung base. Patient is rotated. IMPRESSION: Bilateral airspace  filling opacities consistent with pulmonary edema and/or infectious process. Electronically Signed   By: Norva Pavlov M.D.   On: 11/27/2016 22:17   Dg Femur Min 2 Views Left  Result Date: 11/27/2016 CLINICAL DATA:  Larey Seat while attempting to get out of bed unassisted today. EXAM: LEFT FEMUR 2 VIEWS COMPARISON:  None. FINDINGS: There is a subcapital left hip fracture. Remainder of the femur is intact. No dislocation. No bone lesion or bony destruction. IMPRESSION: Subcapital left hip fracture Electronically Signed   By: Ellery Plunk M.D.   On: 11/27/2016 01:13    Cardiac Studies   TTE: 11/28/16  Study Conclusions  -  Left ventricle: The cavity size was normal. Systolic function was   severely reduced. The estimated ejection fraction was in the   range of 25% to 30%. Wall motion was normal; there were no   regional wall motion abnormalities. Doppler parameters are   consistent with abnormal left ventricular relaxation (grade 1   diastolic dysfunction). There was no evidence of elevated   ventricular filling pressure by Doppler parameters. - Mitral valve: There was trivial regurgitation. - Left atrium: The atrium was mildly dilated. - Right ventricle: Systolic function was normal. - Pulmonic valve: Not visualized. There was no regurgitation. - Inferior vena cava: The vessel was normal in size.  Impressions:  - Very limited image quality, however LVEF is severely impaired,   estimated at 25-30% with akinesis of all of the apical segments   and mid anteroseptal, anterior and anterolateral segments.   There is no thrombus on the contrast images.   A cardiac MRI is recommended for further LVEF evaluation.  Patient Profile     61 y.o. male h/o CAD s/p BMS to the RCA, ICM/systolic CHF, prior CVA, HTN, HL, freq fall, tob abuse, LV thrombus on coumadin, and dementia, who was admitted 2/3 from his SNF with left leg pain after a fall from bed resulting in left femoral neck fx. Cardiology  was asked to provider preop eval.   Assessment & Plan    1.  L Hip Fx/Preoperative Cardiac Evaluation:  Pt with a h/o CAD s/p BMS of the RCA in 12/2014 and ICM/syst CHF with EF of 25-30% by echo in 12/2015.  He was admitted 2/4 after falling out of bed late on 2/3.  Noted to have subcapital L hip fx.  From a cardiac perspective, he reports no recent c/p, dyspnea, pnd, orthopnea, edema, early satiety, presyncope, or syncope.  -- Deemed moderate risk for cardiac complications for perioperative hip surgery.  In the absence of Ss, no further ischemic eval. -- Cont ? blocker therapy throughout the perioperative period.  2.  CAD:  S/p prior BMS  RCA in 12/2014.  No recent c/p.  See above.  Cont ? blocker and statin.  He is not on asa in setting of chronic coumadin.  3.  LV Thrombus:  Noted on echo 11/2015.  Not well appreciated on 12/2015 echo.  He has been on coumadin for at least a year based on records. -- follow up echo this admission showed continued LV dysfunction, but stable. No LV thrombus noted on echo, therefore seems reasonable to stop OAC, especially given his hx of falls. He says that he prefers to f/u with his cardiologist in [place].  4.  Essential HTN: bp stable on ? blocker therapy.  5.  HL:  Cont statin throughout perioperative period.  Signed, Laverda Page, NP  11/28/2016, 1:41 PM     Patient seen and examined. Agree with assessment and plan.Echo as noted with EF 25 - 30% with apical and anterior, anteroseptal and anterolateral akinesis. No thrombus was noted but he had been on warfarin anticoagulation. According to family member pt had had MI initially in 1999, and has had several additional events.  Echo wall motion abnormality suggests LAD infarct. Although no thrombus presently, the substrate remains for recurrent thrombus with apical akinesis. With recurrent fall risk he may not be a candidate for continued systemic anticoagulation.  Hip surgery on hold for probable  pneumonia ? Aspiration, now on vanc/ zosyn.   Lennette Bihari, MD, [facility] 11/28/2016 4:24 PM

## 2016-11-29 DIAGNOSIS — J9621 Acute and chronic respiratory failure with hypoxia: Secondary | ICD-10-CM

## 2016-11-29 LAB — COMPREHENSIVE METABOLIC PANEL
ALBUMIN: 2.4 g/dL — AB (ref 3.5–5.0)
ALK PHOS: 61 U/L (ref 38–126)
ALT: 17 U/L (ref 17–63)
AST: 27 U/L (ref 15–41)
Anion gap: 12 (ref 5–15)
BILIRUBIN TOTAL: 0.9 mg/dL (ref 0.3–1.2)
BUN: 32 mg/dL — AB (ref 6–20)
CO2: 23 mmol/L (ref 22–32)
Calcium: 9 mg/dL (ref 8.9–10.3)
Chloride: 101 mmol/L (ref 101–111)
Creatinine, Ser: 1.82 mg/dL — ABNORMAL HIGH (ref 0.61–1.24)
GFR calc Af Amer: 45 mL/min — ABNORMAL LOW (ref >60)
GFR calc non Af Amer: 38 mL/min — ABNORMAL LOW (ref >60)
GLUCOSE: 212 mg/dL — AB (ref 65–99)
POTASSIUM: 4.1 mmol/L (ref 3.5–5.1)
Sodium: 136 mmol/L (ref 135–145)
TOTAL PROTEIN: 7.1 g/dL (ref 6.5–8.1)

## 2016-11-29 LAB — CBC WITH DIFFERENTIAL/PLATELET
BASOS ABS: 0 10*3/uL (ref 0.0–0.1)
BASOS PCT: 0 %
Eosinophils Absolute: 0 10*3/uL (ref 0.0–0.7)
Eosinophils Relative: 0 %
HEMATOCRIT: 43.3 % (ref 39.0–52.0)
HEMOGLOBIN: 14.6 g/dL (ref 13.0–17.0)
Lymphocytes Relative: 6 %
Lymphs Abs: 0.8 10*3/uL (ref 0.7–4.0)
MCH: 33 pg (ref 26.0–34.0)
MCHC: 33.7 g/dL (ref 30.0–36.0)
MCV: 98 fL (ref 78.0–100.0)
Monocytes Absolute: 0.7 10*3/uL (ref 0.1–1.0)
Monocytes Relative: 5 %
NEUTROS ABS: 12.4 10*3/uL — AB (ref 1.7–7.7)
NEUTROS PCT: 89 %
Platelets: 152 10*3/uL (ref 150–400)
RBC: 4.42 MIL/uL (ref 4.22–5.81)
RDW: 14.7 % (ref 11.5–15.5)
WBC: 14 10*3/uL — AB (ref 4.0–10.5)

## 2016-11-29 LAB — GLUCOSE, CAPILLARY
GLUCOSE-CAPILLARY: 221 mg/dL — AB (ref 65–99)
Glucose-Capillary: 194 mg/dL — ABNORMAL HIGH (ref 65–99)
Glucose-Capillary: 210 mg/dL — ABNORMAL HIGH (ref 65–99)
Glucose-Capillary: 279 mg/dL — ABNORMAL HIGH (ref 65–99)

## 2016-11-29 LAB — PROTIME-INR
INR: 2.46
PROTHROMBIN TIME: 27.1 s — AB (ref 11.4–15.2)

## 2016-11-29 LAB — MAGNESIUM: Magnesium: 1.8 mg/dL (ref 1.7–2.4)

## 2016-11-29 MED ORDER — ORAL CARE MOUTH RINSE
15.0000 mL | Freq: Two times a day (BID) | OROMUCOSAL | Status: DC
  Administered 2016-12-01 – 2016-12-13 (×24): 15 mL via OROMUCOSAL

## 2016-11-29 MED ORDER — RESOURCE THICKENUP CLEAR PO POWD
ORAL | Status: DC | PRN
  Filled 2016-11-29 (×7): qty 125

## 2016-11-29 MED ORDER — ALBUMIN HUMAN 5 % IV SOLN
25.0000 g | Freq: Once | INTRAVENOUS | Status: AC
  Administered 2016-11-29: 25 g via INTRAVENOUS
  Filled 2016-11-29: qty 250

## 2016-11-29 MED ORDER — HALOPERIDOL LACTATE 5 MG/ML IJ SOLN
2.0000 mg | Freq: Four times a day (QID) | INTRAMUSCULAR | Status: DC | PRN
  Administered 2016-11-29 – 2016-12-08 (×9): 2 mg via INTRAVENOUS
  Filled 2016-11-29 (×10): qty 1

## 2016-11-29 MED ORDER — NICOTINE 21 MG/24HR TD PT24
21.0000 mg | MEDICATED_PATCH | Freq: Every day | TRANSDERMAL | Status: DC
  Administered 2016-11-29 – 2016-12-08 (×10): 21 mg via TRANSDERMAL
  Filled 2016-11-29 (×10): qty 1

## 2016-11-29 MED ORDER — VITAMIN K1 10 MG/ML IJ SOLN
5.0000 mg | Freq: Once | INTRAVENOUS | Status: AC
  Administered 2016-11-29: 5 mg via INTRAVENOUS
  Filled 2016-11-29: qty 0.5

## 2016-11-29 NOTE — Evaluation (Signed)
Clinical/Bedside Swallow Evaluation Patient Details  Name: Curtis Keller MRN: [id number] Date of Birth: September 30, 1956  Today's Date: 11/29/2016 Time: SLP Start Time (ACUTE ONLY): 1355 SLP Stop Time (ACUTE ONLY): 1406 SLP Time Calculation (min) (ACUTE ONLY): 11 min  Past Medical History:  Past Medical History:  Diagnosis Date  . Acute respiratory failure (HCC)    a. Spring 2017 following fall/splenectomy -->admitted to [facility]-->trach/g tube.  . Anemia    a. 12/2015 ABL in setting of fall/hematomas/splenic laceration req splenectomy.  Marland Kitchen Apical mural thrombus    a. 11/2015 Echo: EF 25-30%, moderate size apical thrombus-->coumadin;  b. 12/2015 f/u Echo: EF 25-30%, ant/septa HK, no obvious large thrombus but cannot exclude small mural thrombus.  Marland Kitchen CAD (coronary artery disease)    a. 12/2014 s/p BMS to the RCA.  Marland Kitchen Cerebral infarction (HCC)   . Chronic systolic CHF (congestive heart failure) (HCC)    a. 12/2015 Echo: EF 25-30%.  . Dementia   . Depression   . Dysphagia   . Falls    a. 11/2015 traumatic fall with resultant trauma req splenectomy and prolonged hospitalization complicated by resp failure.  Marland Kitchen History of pneumonia   . Hyperlipidemia   . Hypertension   . Ischemic cardiomyopathy    a. 12/2015 Echo: EF 25-30%, anterior and septal HK.  Marland Kitchen Left hemiplegia (HCC)   . Protein calorie malnutrition (HCC)   . Respiratory failure (HCC)   . Spleen injury   . Status post tracheostomy (HCC)    a. 01/2016 in setting of ongoing resp failure and aspiration.   Past Surgical History:  Past Surgical History:  Procedure Laterality Date  . CARDIAC CATHETERIZATION    . CORONARY ANGIOPLASTY WITH STENT PLACEMENT    . IR GENERIC HISTORICAL  07/22/2016   IR GASTROSTOMY TUBE REMOVAL 07/22/2016 [name], MD [facility]-INTERV RAD  . PR LAP, SPLENECTOMY  01/07/2016  . TRACHEOSTOMY TUBE PLACEMENT N/A 02/22/2016   Procedure: TRACHEOSTOMY;  Surgeon: [name], MD;  Location: [facility] OR;  Service:  ENT;  Laterality: N/A;   HPI:  Pt. with PMH of HTN, chronic systolic CHF, chronic respiratory failure on 3L of oxygen, diabetes mellitus, recurrent fall, chronic anticoagulation, dyslipidemia; admitted on 11/26/2016, with complaint of A mechanical fall, was found to have closed left hip fracture.Patient developed aspiration pneumonia in the hospital. Currently surgery is on hold until pneumonia is better. Per MD pt was on dys 2/nectar thick liquids at SNF, but has been on thin liquids here. Pt had an MBS during admission to [facility] in 2017, but record not available. Pt also had a trach at that time. Reproted has a history of dysphagia due to prior CVAs.    Assessment / Plan / Recommendation Clinical Impression  Pt demonstrates no overt signs of aspiration with nectar or thin liquids. He consumed 100% of dys 3 tray without difficulty. Given concern for aspiration pna due to history and recent intake of thin liquids, recommend pt continue current dys 3/nectar thick diet, but will plan on MBS tomorrow for objective assessment of swallowing.     Aspiration Risk  Severe aspiration risk    Diet Recommendation Dysphagia 3 (Mech soft);Nectar-thick liquid   Liquid Administration via: Cup;Straw Medication Administration: Whole meds with liquid Supervision: Patient able to self feed;Intermittent supervision to cue for compensatory strategies Compensations: Slow rate;Small sips/bites Postural Changes: Seated upright at 90 degrees    Other  Recommendations Oral Care Recommendations: Oral care BID Other Recommendations: Order thickener from pharmacy  Follow up Recommendations Skilled Nursing facility      Frequency and Duration            Prognosis Prognosis for Safe Diet Advancement: Fair      Swallow Study   General HPI: Pt. with PMH of HTN, chronic systolic CHF, chronic respiratory failure on 3L of oxygen, diabetes mellitus, recurrent fall, chronic anticoagulation,  dyslipidemia; admitted on 11/26/2016, with complaint of A mechanical fall, was found to have closed left hip fracture.Patient developed aspiration pneumonia in the hospital. Currently surgery is on hold until pneumonia is better. Per MD pt was on dys 2/nectar thick liquids at SNF, but has been on thin liquids here. Pt had an MBS during admission to [facility] in 2017, but record not available. Pt also had a trach at that time. Reproted has a history of dysphagia due to prior CVAs.  Type of Study: Bedside Swallow Evaluation Previous Swallow Assessment: see HPI Diet Prior to this Study: Dysphagia 3 (soft);Nectar-thick liquids Temperature Spikes Noted: No History of Recent Intubation: No Behavior/Cognition: Alert;Cooperative Oral Cavity Assessment: Within Functional Limits Oral Care Completed by SLP: No Oral Cavity - Dentition: Edentulous Vision: Functional for self-feeding Self-Feeding Abilities: Able to feed self Patient Positioning: Upright in bed Baseline Vocal Quality: Normal Volitional Cough: Strong Volitional Swallow: Able to elicit    Oral/Motor/Sensory Function     Ice Chips     Thin Liquid Thin Liquid: Within functional limits Presentation: Cup;Straw;Self Fed    Nectar Thick Nectar Thick Liquid: Within functional limits Presentation: Cup;Self Fed   Honey Thick Honey Thick Liquid: Not tested   Puree Puree: Not tested   Solid   GO   Solid: Not tested       Harlon Ditty, MA CCC-SLP [phone]  Claudine Mouton 11/29/2016,2:09 PM

## 2016-11-29 NOTE — Progress Notes (Signed)
Progress Note  Patient Name: Curtis Keller Date of Encounter: 11/29/2016  Primary Cardiologist: Dr. Desma Maxim [facility])  Subjective   Significant pain from hip;  Agitated earlier and was give haldol.  Inpatient Medications    Scheduled Meds: . aspirin EC  81 mg Oral Daily  . atorvastatin  40 mg Oral QHS  . busPIRone  10 mg Oral BID  . Chlorhexidine Gluconate Cloth  6 each Topical Q0600  . divalproex  125 mg Oral TID  . famotidine  20 mg Oral BID  . FLUoxetine  60 mg Oral Daily  . furosemide  20 mg Intravenous Once  . insulin aspart  0-9 Units Subcutaneous TID WC  . ipratropium-albuterol  3 mL Nebulization Q6H  . methylPREDNISolone (SOLU-MEDROL) injection  60 mg Intravenous Q12H  . metoprolol succinate  25 mg Oral Daily  . mupirocin ointment  1 application Nasal BID  . nicotine  21 mg Transdermal Daily  . phytonadione (VITAMIN K) IV  5 mg Intravenous Once  . piperacillin-tazobactam (ZOSYN)  IV  3.375 g Intravenous Q8H  . polyethylene glycol  17 g Oral Daily  . pregabalin  75 mg Oral Daily  . senna-docusate  1 tablet Oral BID  . vancomycin  750 mg Intravenous Q12H   Continuous Infusions:  PRN Meds: acetaminophen, haloperidol lactate, methocarbamol, morphine injection, oxyCODONE-acetaminophen, RESOURCE THICKENUP CLEAR   Vital Signs    Vitals:   11/29/16 0105 11/29/16 0352 11/29/16 0846 11/29/16 0900  BP:  107/68 (!) 83/59   Pulse:  70 73   Resp:  13 20   Temp:  98.7 F (37.1 C) 97.6 F (36.4 C)   TempSrc:  Oral Oral   SpO2: 93% 92% 90% 91%  Weight:  253 lb 8.5 oz (115 kg)    Height:        Intake/Output Summary (Last 24 hours) at 11/29/16 0904 Last data filed at 11/28/16 1500  Gross per 24 hour  Intake              240 ml  Output              500 ml  Net             -260 ml   Filed Weights   11/26/16 2359 11/29/16 0352  Weight: 225 lb (102.1 kg) 253 lb 8.5 oz (115 kg)    Telemetry    SR in the 70s with PVCs - Personally Reviewed  ECG      ECG (independently read by me): NSR at 66, ILBBB, QS v1-2; small Q's inferiorly  Physical Exam   BP (!) 83/59 (BP Location: Left Arm)   Pulse 73   Temp 97.6 F (36.4 C) (Oral)   Resp 20   Ht 6\' 2"  (1.88 m)   Wt 253 lb 8.5 oz (115 kg)   SpO2 91%   BMI 32.55 kg/m    GEN: Disheveled, WM  No acute distress.   Skin: mild rash Neck: No JVD Cardiac: RRR with rare ectopic beat, faint 1/6 SEM; no s3 Respiratory: Clear to auscultation bilaterally. GI: Soft, nontender, non-distended  MS: No edema; No deformity. Left leg flexed, DP slightly reduced. +1 on left. Neuro:  Nonfocal  Psych: Normal affect   Labs    Chemistry  Recent Labs Lab 11/27/16 0000 11/27/16 2021 11/28/16 0838 11/29/16 0357  NA 138 142 138 136  K 4.3 4.1 4.5 4.1  CL 102 100* 104 101  CO2 24 23 22  23  GLUCOSE 137* 37* 118* 212*  BUN 23* 25* 26* 32*  CREATININE 1.97* 1.61* 1.58* 1.82*  CALCIUM 9.0 9.2 8.7* 9.0  PROT 7.1  --  7.0 7.1  ALBUMIN 3.1*  --  2.6* 2.4*  AST 32  --  22 27  ALT 29  --  18 17  ALKPHOS 82  --  70 61  BILITOT 0.7  --  0.5 0.9  GFRNONAA 35* 45* 46* 38*  GFRAA 40* 52* 53* 45*  ANIONGAP 12 19* 12 12     Hematology  Recent Labs Lab 11/27/16 0000 11/28/16 0838 11/29/16 0357  WBC 14.9* 14.0* 14.0*  RBC 4.65 4.60 4.42  HGB 15.2 15.6 14.6  HCT 45.4 44.9 43.3  MCV 97.6 97.6 98.0  MCH 32.7 33.9 33.0  MCHC 33.5 34.7 33.7  RDW 14.6 14.7 14.7  PLT 174 131* 152    Cardiac Enzymes  Recent Labs Lab 11/27/16 0000 11/27/16 0346 11/27/16 1557 11/27/16 2022  TROPONINI 0.03* 0.04* 0.03* 0.05*   No results for input(s): TROPIPOC in the last 168 hours.   BNP  Recent Labs Lab 11/27/16 0000 11/28/16 1431  BNP 311.3* 355.6*     DDimer No results for input(s): DDIMER in the last 168 hours.   Radiology    Dg Chest Port 1 View  Result Date: 11/28/2016 CLINICAL DATA:  Shortness of breath. EXAM: PORTABLE CHEST 1 VIEW COMPARISON:  11/27/2016 and 02/24/2016 FINDINGS: The  patient has progressive bilateral pulmonary infiltrates which may represent pulmonary edema or progressive pneumonia. Pulmonary vascularity appears normal in the overall heart size is normal. IMPRESSION: Probable progressive bilateral pneumonia. Electronically Signed   By: Francene Boyers M.D.   On: 11/28/2016 15:31   Dg Chest Port 1 View  Result Date: 11/27/2016 CLINICAL DATA:  sob EXAM: PORTABLE CHEST 1 VIEW COMPARISON:  11/27/2016 FINDINGS: The heart is enlarged. There are airspace filling opacities bilaterally, particularly well seen at the left lung base. Patient is rotated. IMPRESSION: Bilateral airspace filling opacities consistent with pulmonary edema and/or infectious process. Electronically Signed   By: Norva Pavlov M.D.   On: 11/27/2016 22:17    Cardiac Studies   TTE: 11/28/16  Study Conclusions  - Left ventricle: The cavity size was normal. Systolic function was   severely reduced. The estimated ejection fraction was in the   range of 25% to 30%. Wall motion was normal; there were no   regional wall motion abnormalities. Doppler parameters are   consistent with abnormal left ventricular relaxation (grade 1   diastolic dysfunction). There was no evidence of elevated   ventricular filling pressure by Doppler parameters. - Mitral valve: There was trivial regurgitation. - Left atrium: The atrium was mildly dilated. - Right ventricle: Systolic function was normal. - Pulmonic valve: Not visualized. There was no regurgitation. - Inferior vena cava: The vessel was normal in size.  Impressions:  - Very limited image quality, however LVEF is severely impaired,   estimated at 25-30% with akinesis of all of the apical segments   and mid anteroseptal, anterior and anterolateral segments.   There is no thrombus on the contrast images.   A cardiac MRI is recommended for further LVEF evaluation.  Patient Profile     61 y.o. male h/o CAD s/p BMS to the RCA, ICM/systolic CHF, prior  CVA, HTN, HL, freq fall, tob abuse, LV thrombus on coumadin, and dementia, who was admitted 2/3 from his SNF with left leg pain after a fall from bed resulting in left femoral  neck fx. Cardiology was asked to provider preop eval.   Assessment & Plan    1.  L Hip Fx/Preoperative Cardiac Evaluation:  Pt with a h/o CAD s/p BMS of the RCA in 12/2014 and ICM/syst CHF with EF of 25-30% by echo in 12/2015.  He was admitted 2/4 after falling out of bed late on 2/3.  Noted to have subcapital L hip fx.  From a cardiac perspective, he reports no recent c/p, dyspnea, pnd, orthopnea, edema, early satiety, presyncope, or syncope.  -- Deemed moderate risk for cardiac complications for perioperative hip surgery.  In the absence of Ss, no further ischemic eval. -- Cont ? blocker therapy throughout the perioperative period.  2.  CAD:  S/p prior BMS  RCA in 12/2014.  No recent c/p.  See above.  Cont ? blocker and statin.  He is not on asa in setting of chronic coumadin.  3.  LV Thrombus:  Noted on echo 11/2015.  Not well appreciated on 12/2015 echo.  He has been on coumadin for at least a year based on records.  11/28/16 Echo  with EF 25 - 30% with apical and anterior, anteroseptal and anterolateral akinesis. No thrombus was noted but he had been on warfarin anticoagulation. According to family member pt had had MI initially in 1999, and has had several additional events.  Echo wall motion abnormality suggests LAD infarct. Although no thrombus presently, the substrate remains for recurrent thrombus with apical akinesis. With recurrent fall risk he may not be a candidate for continued systemic anticoagulation. Now on ASA.  4. Pneumonia: now on Vanc/Zosyn  5. Essential HTN: BP was low this am at 83/59; now 126/74  6.  HL:  Cont statin throughout perioperative period.  7. CKD Stage 3: Cr today 1.82; GFR 45  Signed, Nicki Guadalajara, MD, [facility] 11/29/2016, 9:04 AM

## 2016-11-29 NOTE — Progress Notes (Signed)
   11/29/16 1400  SLP G-Codes **NOT FOR INPATIENT CLASS**  Functional Assessment Tool Used clinical judgement  Functional Limitations Swallowing  Swallow Current Status (Q3009) CJ  Swallow Goal Status (Q3300) CJ  SLP Evaluations  $ SLP Speech Visit 1 Procedure  SLP Evaluations  $BSS Swallow 1 Procedure

## 2016-11-29 NOTE — Care Management Note (Signed)
Case Management Note  Patient Details  Name: Curtis Keller MRN: [id number] Date of Birth: 1956-02-17  Subjective/Objective:     Adm w hip fracture               Action/Plan: [facility]   Expected Discharge Date:                  Expected Discharge Plan:  Skilled Nursing Facility  In-House Referral:  Clinical Social Work  Discharge planning Services     Post Acute Care Choice:    Choice offered to:     DME Arranged:    DME Agency:     HH Arranged:    HH Agency:     Status of Service:  In process, will continue to follow  If discussed at Long Length of Stay Meetings, dates discussed:    Additional Comments:sw ref made.  Hanley Hays, RN 11/29/2016, 9:29 AM

## 2016-11-29 NOTE — Progress Notes (Addendum)
[facility] Progress Note  Patient: Curtis Keller WJX:[id number]   PCP: Kirt Boys, DO DOB: [birth year]   DOA: 11/26/2016   DOS: 11/29/2016   Date of Service: the patient was seen and examined on 11/29/2016   Subjective: She was agitated in the morning, after receiving Haldol calmed down and was oriented. Denies any acute complaint. Feeling better.  Brief hospital course: Pt. with PMH of HTN, chronic systolic CHF, chronic respiratory failure on 3 L of oxygen, diabetes mellitus, recurrent fall, chronic anticoagulation, dyslipidemia; admitted on 11/26/2016, with complaint of A mechanical fall, was found to have closed left hip fracture. Patient developed aspiration pneumonia in the hospital. Cardiology consulted for preop clearance. Orthopedics consulted as well. Currently surgery is on hold until pneumonia is better. Currently further plan is continue IV antibiotics.  Assessment and Plan: 1. Closed left hip fracture [facility]) Appreciate orthopedic input. Patient recommended to go for surgery once hemodynamically stable. Pneumonia appears to be stabilizing at present, recommend to monitor tomorrow and if stable, can attempt for the surgery on Thursday 12/01/2016.  2. Aspiration pneumonia, healthcare associated pneumonia. Acute on chronic respiratory failure Bilateral with progressively worsening. Currently on 4 L of oxygen, at his baseline on 3 L. expiratory wheezing. Treating with IV vancomycin and IV Zosyn. Blood cultures performed. Pneumonia order set used. Last order for diet was soft diet with nectar thick consistency, Per RN Patient was receiving soft diet, thin liquids, here in the hospital, he is on dysphagia type III diet with nectar thick consistency at SNF. As per his sister, pt has also received on and off thin liquids by herself in the SNF. speech therapy consulted who recommended dysphagia 3 diet. Mucinex as well as started was ordered. Also started on IV steroids 60 mg  every 12 hours. Influenza PCR negative  3. Chronic systolic CHF. History of apical thrombus Coronary artery disease. CVA with residual left-sided weakness. INR currently therapeutic. Give vitamin K With the absence of apical thrombus on the echocardiogram and history of recurrent fall patient would be a poor candidate for chronic anticoagulation and therefore recommending to stop and examination. Started 81 mg aspirin given his history of coronary artery disease. Currently continuing Toprol-XL. BNP 355, mild elevation from baseline of 310. Close monitoring.  4. GERD. Continue Pepcid.  5. History of anxiety. Acute encephalopathy with delirium likely in the setting of sepsis Continue Prozac. IV Haldol when necessary  6. Type 2 diabetes mellitus. Continue sliding scale at present.  Bowel regimen: last BM 11/24/2016 Diet: Dysphagia type III diet with nectar thick liquids, speech therapy consulted DVT Prophylaxis: on therapeutic anticoagulation.  Advance goals of care discussion: full code, as discussed with patient's sister  Family Communication: no family was present at bedside, at the time of interview.  Disposition:  Discharge to SNF. Expected discharge date: 12/02/2016, stabilization of respiratory issue as well as postoperative recovery  Consultants: Cardiology, orthopedics Procedures: Echocardiogram  Antibiotics: Anti-infectives    Start     Dose/Rate Route Frequency Ordered Stop   11/28/16 1530  piperacillin-tazobactam (ZOSYN) IVPB 3.375 g     3.375 g 12.5 mL/hr over 240 Minutes Intravenous Every 8 hours 11/28/16 1518     11/28/16 1530  vancomycin (VANCOCIN) IVPB 750 mg/150 ml premix     750 mg 150 mL/hr over 60 Minutes Intravenous Every 12 hours 11/28/16 1518     11/28/16 1520  ceFAZolin (ANCEF) IVPB 2g/100 mL premix  Status:  Discontinued     2 g 200 mL/hr over 30  Minutes Intravenous To [facility] 11/28/16 1247 11/28/16 2334   11/28/16 1000   piperacillin-tazobactam (ZOSYN) IVPB 3.375 g  Status:  Discontinued     3.375 g 12.5 mL/hr over 240 Minutes Intravenous Every 8 hours 11/28/16 0832 11/28/16 1518   11/28/16 1000  vancomycin (VANCOCIN) IVPB 750 mg/150 ml premix  Status:  Discontinued     750 mg 150 mL/hr over 60 Minutes Intravenous Every 12 hours 11/28/16 0832 11/28/16 1518        Objective: Physical Exam: Vitals:   11/29/16 0900 11/29/16 1215 11/29/16 1400 11/29/16 1416  BP:  (!) 114/59 128/74   Pulse:  77 76 86  Resp:  18 (!) 25 16  Temp:  97.7 F (36.5 C)    TempSrc:  Oral    SpO2: 91% 91% 93% 94%  Weight:      Height:       No intake or output data in the 24 hours ending 11/29/16 1611 Filed Weights   11/26/16 2359 11/29/16 0352  Weight: 102.1 kg (225 lb) 115 kg (253 lb 8.5 oz)    General: Alert, Awake and Oriented to Time, Place and Person. Appear in mild distress, affect appropriate Eyes: PERRL Conjunctiva normal ENT: Oral Mucosa clear moist. Neck: no JVD, no Abnormal Mass Or lumps Cardiovascular: S1 and S2 Present, aortic systolic Murmur, Respiratory: Bilateral Air entry equal and Decreased, no use of accessory muscle, bilateral Crackles, exp wheezes Abdomen: Bowel Sound present, Soft and no tenderness Skin: no redness, no Rash, no induration Extremities: no Pedal edema, no calf tenderness Neurologic: Grossly no focal neuro deficit. Bilaterally Equal motor strength  Data Reviewed: CBC:  Recent Labs Lab 11/27/16 0000 11/28/16 0838 11/29/16 0357  WBC 14.9* 14.0* 14.0*  NEUTROABS 9.4* 8.4* 12.4*  HGB 15.2 15.6 14.6  HCT 45.4 44.9 43.3  MCV 97.6 97.6 98.0  PLT 174 131* 152   Basic Metabolic Panel:  Recent Labs Lab 11/27/16 0000 11/27/16 2021 11/28/16 0838 11/28/16 1012 11/29/16 0357  NA 138 142 138  --  136  K 4.3 4.1 4.5  --  4.1  CL 102 100* 104  --  101  CO2 24 23 22   --  23  GLUCOSE 137* 37* 118*  --  212*  BUN 23* 25* 26*  --  32*  CREATININE 1.97* 1.61* 1.58*  --  1.82*    CALCIUM 9.0 9.2 8.7*  --  9.0  MG  --   --   --  1.8 1.8    Liver Function Tests:  Recent Labs Lab 11/27/16 0000 11/28/16 0838 11/29/16 0357  AST 32 22 27  ALT 29 18 17   ALKPHOS 82 70 61  BILITOT 0.7 0.5 0.9  PROT 7.1 7.0 7.1  ALBUMIN 3.1* 2.6* 2.4*   No results for input(s): LIPASE, AMYLASE in the last 168 hours. No results for input(s): AMMONIA in the last 168 hours. Coagulation Profile:  Recent Labs Lab 11/27/16 0000 11/28/16 1605 11/29/16 0357  INR 2.31 2.49 2.46   Cardiac Enzymes:  Recent Labs Lab 11/27/16 0000 11/27/16 0346 11/27/16 1557 11/27/16 2022  TROPONINI 0.03* 0.04* 0.03* 0.05*   BNP (last 3 results) No results for input(s): PROBNP in the last 8760 hours.  CBG:  Recent Labs Lab 11/28/16 1223 11/28/16 1633 11/28/16 2249 11/29/16 0842 11/29/16 1213  GLUCAP 151* 172* 191* 194* 210*    Studies: No results found.   Scheduled Meds: . aspirin EC  81 mg Oral Daily  . atorvastatin  40 mg  Oral QHS  . busPIRone  10 mg Oral BID  . Chlorhexidine Gluconate Cloth  6 each Topical Q0600  . divalproex  125 mg Oral TID  . famotidine  20 mg Oral BID  . FLUoxetine  60 mg Oral Daily  . furosemide  20 mg Intravenous Once  . insulin aspart  0-9 Units Subcutaneous TID WC  . ipratropium-albuterol  3 mL Nebulization Q6H  . methylPREDNISolone (SOLU-MEDROL) injection  60 mg Intravenous Q12H  . metoprolol succinate  25 mg Oral Daily  . mupirocin ointment  1 application Nasal BID  . nicotine  21 mg Transdermal Daily  . piperacillin-tazobactam (ZOSYN)  IV  3.375 g Intravenous Q8H  . polyethylene glycol  17 g Oral Daily  . pregabalin  75 mg Oral Daily  . senna-docusate  1 tablet Oral BID  . vancomycin  750 mg Intravenous Q12H   Continuous Infusions:  PRN Meds: acetaminophen, haloperidol lactate, methocarbamol, morphine injection, oxyCODONE-acetaminophen, RESOURCE THICKENUP CLEAR  Time spent: 35 minutes  Author: Lynden Oxford, MD [facility] Pager: [phone] 11/29/2016 4:11 PM  If 7PM-7AM, please contact night-coverage at [email or web address], password [facility]

## 2016-11-29 NOTE — Progress Notes (Signed)
Around 0745 this morning, Pt screaming and cursing about wanting a cigarette, RN entered room and attempted to explain to pt that he was in the hospital and could not have a cigarette he then attempted to throw the call light at me and starting kicking and throwing all the blankets and pillows off the bed. Md paged and order obtained for haldol however pt was too combative for nurse to get close enough so security was called. 2 other Rns and one tech all present in the room. 2mg  of IV haldol given per order. After haldol pt was calm and cooperative and does not remember this mornings events.

## 2016-11-29 NOTE — [place] FL2 (Signed)
[place] MEDICAID FL2 LEVEL OF CARE SCREENING TOOL     IDENTIFICATION  Patient Name: Curtis Keller Birthdate: 10/29/1955 Sex: male Admission Date (Current Location): 11/26/2016  [facility] and [place] Number:  Producer, television/film/video and Address:  [facility], [address] [place], [place] [address]      Provider Number: [id number]  Attending Physician Name and Address:  Rolly Salter, MD  Relative Name and Phone Number:       Current Level of Care: Hospital Recommended Level of Care: Skilled Nursing Facility Prior Approval Number:    Date Approved/Denied:   PASRR Number:    Discharge Plan: SNF    Current Diagnoses: Patient Active Problem List   Diagnosis Date Noted  . Acute and chronic respiratory failure with hypoxia (HCC) 11/28/2016  . Hip fracture (HCC) 11/27/2016  . Closed left hip fracture (HCC) 11/27/2016  . Chronic respiratory failure (HCC) 11/27/2016  . Fall at nursing home 07/17/2016  . Diabetes mellitus, type II, insulin dependent (HCC) 06/12/2016  . GERD (gastroesophageal reflux disease) 05/07/2016  . COPD (chronic obstructive pulmonary disease) (HCC) 04/18/2016  . Chronic anticoagulation 03/22/2016  . S/P splenectomy 03/13/2016  . PEG (percutaneous endoscopic gastrostomy) status (HCC) 03/13/2016  . MRSA (methicillin resistant Staphylococcus aureus) septicemia (HCC) 03/13/2016  . Aspiration pneumonia (HCC) 03/13/2016  . Dysphagia following cerebrovascular accident 03/13/2016  . Acute blood loss anemia 03/13/2016  . Hyperlipidemia 03/13/2016  . Heart failure, chronic systolic (HCC)   . Hypertension   . Depression   . Status post tracheostomy (HCC)   . Cardiomyopathy (HCC)   . Apical mural thrombus   . Protein calorie malnutrition (HCC)   . Intraparenchymal hematoma of brain due to trauma [facility])     Orientation RESPIRATION BLADDER Height & Weight     Self  O2 (Nasal cannula 4L) Incontinent, External catheter Weight: 115  kg (253 lb 8.5 oz) Height:  6\' 2"  (188 cm)  BEHAVIORAL SYMPTOMS/MOOD NEUROLOGICAL BOWEL NUTRITION STATUS      Continent Diet (Please see DC Summary)  AMBULATORY STATUS COMMUNICATION OF NEEDS Skin   Extensive Assist Verbally Normal                       Personal Care Assistance Level of Assistance  Bathing, Feeding, Dressing Bathing Assistance: Maximum assistance Feeding assistance: Limited assistance       Functional Limitations Info             SPECIAL CARE FACTORS FREQUENCY  PT (By licensed PT)     PT Frequency: not assessed              Contractures Contractures Info: Not present    Additional Factors Info  Code Status, Allergies, Isolation Precautions Code Status Info: Full Allergies Info:  Codeine, Hydrocodone, Hydrocodone-acetaminophen, Tegaderm Ag Mesh Silver, Lisinopril, Tylenol Acetaminophen     Isolation Precautions Info: MRSA     Current Medications (11/29/2016):  This is the current hospital active medication list Current Facility-Administered Medications  Medication Dose Route Frequency Provider Last Rate Last Dose  . acetaminophen (TYLENOL) tablet 650 mg  650 mg Oral Q4H PRN Leanne Chang, NP   650 mg at 11/28/16 0007  . aspirin EC tablet 81 mg  81 mg Oral Daily Rolly Salter, MD   81 mg at 11/29/16 0844  . atorvastatin (LIPITOR) tablet 40 mg  40 mg Oral QHS Clydie Braun, MD   40 mg at 11/28/16 2026  .  busPIRone (BUSPAR) tablet 10 mg  10 mg Oral BID Clydie Braun, MD   10 mg at 11/29/16 0844  . Chlorhexidine Gluconate Cloth 2 % PADS 6 each  6 each Topical Q0600 Rolly Salter, MD   6 each at 11/29/16 [phone]  . divalproex (DEPAKOTE) DR tablet 125 mg  125 mg Oral TID Clydie Braun, MD   125 mg at 11/29/16 0844  . famotidine (PEPCID) tablet 20 mg  20 mg Oral BID Clydie Braun, MD   20 mg at 11/29/16 0844  . FLUoxetine (PROZAC) capsule 60 mg  60 mg Oral Daily Clydie Braun, MD   60 mg at 11/29/16 0843  . furosemide (LASIX) injection 20  mg  20 mg Intravenous Once Rolly Salter, MD      . haloperidol lactate (HALDOL) injection 2 mg  2 mg Intravenous Q6H PRN Rolly Salter, MD   2 mg at 11/29/16 1941  . insulin aspart (novoLOG) injection 0-9 Units  0-9 Units Subcutaneous TID WC Clydie Braun, MD   2 Units at 11/29/16 1003  . ipratropium-albuterol (DUONEB) 0.5-2.5 (3) MG/3ML nebulizer solution 3 mL  3 mL Nebulization Q6H Rolly Salter, MD   3 mL at 11/29/16 0858  . methocarbamol (ROBAXIN) tablet 500 mg  500 mg Oral Q8H PRN Rolly Salter, MD      . methylPREDNISolone sodium succinate (SOLU-MEDROL) 125 mg/2 mL injection 60 mg  60 mg Intravenous Q12H Rolly Salter, MD   60 mg at 11/29/16 0537  . metoprolol succinate (TOPROL-XL) 24 hr tablet 25 mg  25 mg Oral Daily Rolly Salter, MD   25 mg at 11/29/16 0844  . morphine 2 MG/ML injection 1-2 mg  1-2 mg Intravenous Q3H PRN Rolly Salter, MD   1 mg at 11/29/16 0202  . mupirocin ointment (BACTROBAN) 2 % 1 application  1 application Nasal BID Rolly Salter, MD   1 application at 11/29/16 0845  . nicotine (NICODERM CQ - dosed in mg/24 hours) patch 21 mg  21 mg Transdermal Daily Rolly Salter, MD   21 mg at 11/29/16 0839  . oxyCODONE-acetaminophen (PERCOCET/ROXICET) 5-325 MG per tablet 1 tablet  1 tablet Oral Q6H PRN Rolly Salter, MD      . phytonadione (VITAMIN K) 5 mg in dextrose 5 % 50 mL IVPB  5 mg Intravenous Once Rolly Salter, MD   5 mg at 11/29/16 1004  . piperacillin-tazobactam (ZOSYN) IVPB 3.375 g  3.375 g Intravenous Q8H Para March Batchelder, RPH   3.375 g at 11/29/16 7408  . polyethylene glycol (MIRALAX / GLYCOLAX) packet 17 g  17 g Oral Daily Rolly Salter, MD   17 g at 11/29/16 0845  . pregabalin (LYRICA) capsule 75 mg  75 mg Oral Daily Rolly Salter, MD   75 mg at 11/29/16 0843  . RESOURCE THICKENUP CLEAR   Oral PRN Rolly Salter, MD      . senna-docusate (Senokot-S) tablet 1 tablet  1 tablet Oral BID Rolly Salter, MD   1 tablet at 11/29/16 [phone]  . vancomycin  (VANCOCIN) IVPB 750 mg/150 ml premix  750 mg Intravenous Q12H Armandina Stammer, RPH   750 mg at 11/29/16 1856     Discharge Medications: Please see discharge summary for a list of discharge medications.  Relevant Imaging Results:  Relevant Lab Results:   Additional Information SSN: [id number] [place], [place]

## 2016-11-30 ENCOUNTER — Inpatient Hospital Stay (HOSPITAL_COMMUNITY): Payer: Medicare Other

## 2016-11-30 DIAGNOSIS — I5021 Acute systolic (congestive) heart failure: Secondary | ICD-10-CM

## 2016-11-30 LAB — BASIC METABOLIC PANEL
Anion gap: 14 (ref 5–15)
BUN: 43 mg/dL — ABNORMAL HIGH (ref 6–20)
CALCIUM: 9.2 mg/dL (ref 8.9–10.3)
CO2: 24 mmol/L (ref 22–32)
CREATININE: 1.83 mg/dL — AB (ref 0.61–1.24)
Chloride: 101 mmol/L (ref 101–111)
GFR calc non Af Amer: 38 mL/min — ABNORMAL LOW (ref >60)
GFR, EST AFRICAN AMERICAN: 44 mL/min — AB (ref >60)
Glucose, Bld: 193 mg/dL — ABNORMAL HIGH (ref 65–99)
Potassium: 3.9 mmol/L (ref 3.5–5.1)
SODIUM: 139 mmol/L (ref 135–145)

## 2016-11-30 LAB — CBC
HCT: 40 % (ref 39.0–52.0)
Hemoglobin: 13.5 g/dL (ref 13.0–17.0)
MCH: 32.5 pg (ref 26.0–34.0)
MCHC: 33.8 g/dL (ref 30.0–36.0)
MCV: 96.4 fL (ref 78.0–100.0)
Platelets: 169 10*3/uL (ref 150–400)
RBC: 4.15 MIL/uL — ABNORMAL LOW (ref 4.22–5.81)
RDW: 14.7 % (ref 11.5–15.5)
WBC: 23.4 10*3/uL — ABNORMAL HIGH (ref 4.0–10.5)

## 2016-11-30 LAB — GLUCOSE, CAPILLARY
GLUCOSE-CAPILLARY: 141 mg/dL — AB (ref 65–99)
GLUCOSE-CAPILLARY: 159 mg/dL — AB (ref 65–99)
GLUCOSE-CAPILLARY: 188 mg/dL — AB (ref 65–99)
Glucose-Capillary: 232 mg/dL — ABNORMAL HIGH (ref 65–99)

## 2016-11-30 LAB — MAGNESIUM: MAGNESIUM: 2 mg/dL (ref 1.7–2.4)

## 2016-11-30 LAB — PROTIME-INR
INR: 1.31
Prothrombin Time: 16.4 seconds — ABNORMAL HIGH (ref 11.4–15.2)

## 2016-11-30 LAB — PROCALCITONIN: PROCALCITONIN: 0.45 ng/mL

## 2016-11-30 MED ORDER — HYDRALAZINE HCL 25 MG PO TABS
12.5000 mg | ORAL_TABLET | Freq: Two times a day (BID) | ORAL | Status: DC
  Administered 2016-11-30 – 2016-12-13 (×22): 12.5 mg via ORAL
  Filled 2016-11-30 (×23): qty 1

## 2016-11-30 MED ORDER — HEPARIN SODIUM (PORCINE) 5000 UNIT/ML IJ SOLN
5000.0000 [IU] | Freq: Three times a day (TID) | INTRAMUSCULAR | Status: AC
  Administered 2016-11-30 – 2016-12-04 (×14): 5000 [IU] via SUBCUTANEOUS
  Filled 2016-11-30 (×14): qty 1

## 2016-11-30 MED ORDER — PREDNISONE 20 MG PO TABS
40.0000 mg | ORAL_TABLET | Freq: Every day | ORAL | Status: DC
  Administered 2016-12-01: 40 mg via ORAL
  Filled 2016-11-30 (×2): qty 2

## 2016-11-30 MED ORDER — FUROSEMIDE 10 MG/ML IJ SOLN
60.0000 mg | Freq: Once | INTRAMUSCULAR | Status: AC
  Administered 2016-11-30: 60 mg via INTRAVENOUS
  Filled 2016-11-30: qty 6

## 2016-11-30 NOTE — Progress Notes (Signed)
Progress Note  Patient Name: Curtis Keller Date of Encounter: 11/30/2016  Primary Cardiologist: Dr. Desma Maxim [facility] [place])  Subjective    Feels better today  Inpatient Medications    Scheduled Meds: . aspirin EC  81 mg Oral Daily  . atorvastatin  40 mg Oral QHS  . busPIRone  10 mg Oral BID  . Chlorhexidine Gluconate Cloth  6 each Topical Q0600  . divalproex  125 mg Oral TID  . famotidine  20 mg Oral BID  . FLUoxetine  60 mg Oral Daily  . insulin aspart  0-9 Units Subcutaneous TID WC  . ipratropium-albuterol  3 mL Nebulization Q6H  . mouth rinse  15 mL Mouth Rinse BID  . methylPREDNISolone (SOLU-MEDROL) injection  60 mg Intravenous Q12H  . metoprolol succinate  25 mg Oral Daily  . mupirocin ointment  1 application Nasal BID  . nicotine  21 mg Transdermal Daily  . piperacillin-tazobactam (ZOSYN)  IV  3.375 g Intravenous Q8H  . polyethylene glycol  17 g Oral Daily  . pregabalin  75 mg Oral Daily  . senna-docusate  1 tablet Oral BID  . vancomycin  750 mg Intravenous Q12H   Continuous Infusions:  PRN Meds: acetaminophen, haloperidol lactate, methocarbamol, morphine injection, oxyCODONE-acetaminophen, RESOURCE THICKENUP CLEAR   Vital Signs    Vitals:   11/30/16 0109 11/30/16 0400 11/30/16 0500 11/30/16 0804  BP:    122/69  Pulse:    76  Resp:      Temp:  98 F (36.7 C)  98.8 F (37.1 C)  TempSrc:  Oral  Oral  SpO2: 91%   93%  Weight:   253 lb 12 oz (115.1 kg)   Height:        Intake/Output Summary (Last 24 hours) at 11/30/16 0838 Last data filed at 11/30/16 0400  Gross per 24 hour  Intake              850 ml  Output              450 ml  Net              400 ml   Filed Weights   11/26/16 2359 11/29/16 0352 11/30/16 0500  Weight: 225 lb (102.1 kg) 253 lb 8.5 oz (115 kg) 253 lb 12 oz (115.1 kg)    Telemetry    SR in the 70s with PVCs - Personally Reviewed  ECG    ECG (independently read by me): NSR at 66, ILBBB, QS v1-2; small Q's  inferiorly  Physical Exam   BP 122/69 (BP Location: Left Arm)   Pulse 76   Temp 98.8 F (37.1 C) (Oral)   Resp 20   Ht 6\' 2"  (1.88 m)   Wt 253 lb 12 oz (115.1 kg)   SpO2 93%   BMI 32.58 kg/m    GEN:  No acute distress.   Skin: mild rash Neck: No JVD Cardiac: RRR with rare ectopic beat, faint 1/6 SEM; no s3, no rub Respiratory:  slightly decreased; no wheezing GI: Soft, nontender, non-distended  MS: No edema; No deformity. Left leg flexed, DP slightly reduced. +1 on left. Neuro:  Nonfocal  Psych: Normal affect   Labs    Chemistry  Recent Labs Lab 11/27/16 0000  11/28/16 0838 11/29/16 0357 11/30/16 0523  NA 138  < > 138 136 139  K 4.3  < > 4.5 4.1 3.9  CL 102  < > 104 101 101  CO2 24  < >  22 23 24   GLUCOSE 137*  < > 118* 212* 193*  BUN 23*  < > 26* 32* 43*  CREATININE 1.97*  < > 1.58* 1.82* 1.83*  CALCIUM 9.0  < > 8.7* 9.0 9.2  PROT 7.1  --  7.0 7.1  --   ALBUMIN 3.1*  --  2.6* 2.4*  --   AST 32  --  22 27  --   ALT 29  --  18 17  --   ALKPHOS 82  --  70 61  --   BILITOT 0.7  --  0.5 0.9  --   GFRNONAA 35*  < > 46* 38* 38*  GFRAA 40*  < > 53* 45* 44*  ANIONGAP 12  < > 12 12 14   < > = values in this interval not displayed.   Hematology  Recent Labs Lab 11/28/16 1610 11/29/16 0357 11/30/16 0523  WBC 14.0* 14.0* 23.4*  RBC 4.60 4.42 4.15*  HGB 15.6 14.6 13.5  HCT 44.9 43.3 40.0  MCV 97.6 98.0 96.4  MCH 33.9 33.0 32.5  MCHC 34.7 33.7 33.8  RDW 14.7 14.7 14.7  PLT 131* 152 169    Cardiac Enzymes  Recent Labs Lab 11/27/16 0000 11/27/16 0346 11/27/16 1557 11/27/16 2022  TROPONINI 0.03* 0.04* 0.03* 0.05*   No results for input(s): TROPIPOC in the last 168 hours.   BNP  Recent Labs Lab 11/27/16 0000 11/28/16 1431  BNP 311.3* 355.6*     DDimer No results for input(s): DDIMER in the last 168 hours.   Radiology    Dg Chest Port 1 View  Result Date: 11/28/2016 CLINICAL DATA:  Shortness of breath. EXAM: PORTABLE CHEST 1 VIEW COMPARISON:   11/27/2016 and 02/24/2016 FINDINGS: The patient has progressive bilateral pulmonary infiltrates which may represent pulmonary edema or progressive pneumonia. Pulmonary vascularity appears normal in the overall heart size is normal. IMPRESSION: Probable progressive bilateral pneumonia. Electronically Signed   By: Francene Boyers M.D.   On: 11/28/2016 15:31    Cardiac Studies   TTE: 11/28/16  Study Conclusions  - Left ventricle: The cavity size was normal. Systolic function was   severely reduced. The estimated ejection fraction was in the   range of 25% to 30%. Wall motion was normal; there were no   regional wall motion abnormalities. Doppler parameters are   consistent with abnormal left ventricular relaxation (grade 1   diastolic dysfunction). There was no evidence of elevated   ventricular filling pressure by Doppler parameters. - Mitral valve: There was trivial regurgitation. - Left atrium: The atrium was mildly dilated. - Right ventricle: Systolic function was normal. - Pulmonic valve: Not visualized. There was no regurgitation. - Inferior vena cava: The vessel was normal in size.  Impressions:  - Very limited image quality, however LVEF is severely impaired,   estimated at 25-30% with akinesis of all of the apical segments   and mid anteroseptal, anterior and anterolateral segments.   There is no thrombus on the contrast images.   A cardiac MRI is recommended for further LVEF evaluation.  Patient Profile     61 y.o. male h/o CAD s/p BMS to the RCA, ICM/systolic CHF, prior CVA, HTN, HL, freq fall, tob abuse, LV thrombus on coumadin, and dementia, who was admitted 2/3 from his SNF with left leg pain after a fall from bed resulting in left femoral neck fx. Cardiology was asked to provider preop eval.   Assessment & Plan    1.  L Hip  Fx/Preoperative Cardiac Evaluation:  Pt with a h/o CAD s/p BMS of the RCA in 12/2014 and ICM/syst CHF with EF of 25-30% by echo in 12/2015.  He was  admitted 2/4 after falling out of bed late on 2/3.  Noted to have subcapital L hip fx.  From a cardiac perspective, he reports no recent c/p, dyspnea, pnd, orthopnea, edema, early satiety, presyncope, or syncope.   Deemed moderate risk for cardiac complications for perioperative hip surgery.  In the absence of Ss, no further ischemic eval.  Cont ? blocker therapy throughout the perioperative period. Obtain ECG post-op.  2.  CAD:  S/p prior BMS  RCA in 12/2014.  No recent c/p.  See above.  Cont ? blocker and statin.   3.  LV Thrombus:  Noted on echo 11/2015.  Not well appreciated on 12/2015 echo.  He has been on coumadin for at least a year based on records.  11/28/16 Echo  with EF 25 - 30% with apical and anterior, anteroseptal and anterolateral akinesis. No thrombus was noted but he had been on warfarin anticoagulation. According to family member pt had had MI initially in 1999, and has had several additional events.  Echo wall motion abnormality suggests LAD infarct. Although no thrombus presently, the substrate remains for recurrent thrombus with apical akinesis. With recurrent fall risk he may not be a candidate for continued systemic anticoagulation. Now on ASA.  4. Ischemic cardiomyopathy:  EF 25 - 30%. F/U BNP 11/28/16 at 355  I/O -1520 since admission.  5. Pneumonia: now on Vanc/Zosyn  CXR 2/5 demonstrated progressive pneumonia changes vs CHF with bilateral infiltrates.  WBC increased today to 23.4 may also be contributed by steroids.  6. Essential HTN: BP stable today at 122/89; if BP allows with LV dysfunction may ultimately benefit from low dose hydralazine; will start at 12.5 mg bid today is BP allows.  7. HLD:  Continue statin   8. CKD Stage 3: Cr today 1.83; GFR 38  Signed, Nicki Guadalajara, MD, [facility] 11/30/2016, 8:38 AM

## 2016-11-30 NOTE — Progress Notes (Addendum)
PROGRESS NOTE        PATIENT DETAILS Name: Curtis Keller Age: 61 y.o. Sex: male Date of Birth: 1955/12/13 Admit Date: 11/26/2016 Admitting Physician Clydie Braun, MD ZOX:[id number], Maxine Glenn, DO  Brief Narrative: Patient is a 61 y.o. male with history chronic systolic heart failure (EF 25-30%), history of LV thrombus, prior CVA with chronic left-sided hemiplegia-multiple falls over the past few years causing splenic rupture-presented to the hospital on 2/3 following a mechanical fall, found to have a left hip fracture. Unfortunately hospital course has been complicated by development of acute hypoxic respiratory failure, felt due to aspiration PNA vs acute systolic CHF.   Subjective: Feels better, but still on 4-5 L of oxygen.  Assessment/Plan: Closed hip fracture: Secondary to a mechanical fall, patient is not stable/optimal to proceed with surgery at this time. Left message for Dr. Charlann Boxer.  Acute hypoxemic respiratory failure secondary to either aspiration pneumonia or acute on chronic systolic heart failure: Apparently had a aspiration event on 2/5-subsequently moved to stepdown and started on empiric antimicrobial therapy. Claims his dry weight is around 225 pounds, however today it is up to 253 pounds-but no leg edema-given worsening chest x-ray findings, start IV Lasix-will attempt to keep in negative balance. Do not think, patient has COPD exacerbation-will start tapering steroids. Continue empiric antimicrobial therapy.  History of left apical thrombus: Previously on Coumadin-currently on hold for surgery. Given high risk for venous thromboembolism post surgery, suspect will require short-term anticoagulation. Long discussion with patient's sister at bedside, she would prefer short-term anticoagulation and is willing to accept all risks.  Acute kidney injury: Suspect prerenal azotemia-hemodynamic mediated injury-check renal ultrasound  History of CVA with  chronic left sided weakness: Previously on anticoagulation-left-sided deficits are unchanged. Continue statin. See above regarding anticoagulation  GERD: Continue Pepcid  Type 2 diabetes: CBGs stable-continue SSI  History of anxiety//depression/mild dementia: Continue Depakote, fluoxetine and BuSpar.  Frequent falls: Has had frequent falls in the past few years-one episode of fall resulted in a splenic laceration requiring splenectomy. Unfortunately, patient also has a history of LV thrombus and a CVA with left hemiplegia. Although he is a poor long term candidate, he is at significant risk of having venous thromboembolism post-hip surgery. Long discussion with the patient's sister (who is a Engineer, civil (consulting) at bedside)-she is agreeable to restart anticoagulation for the short-term.  Palliative care: Remains a full code, poor long-term prognosis-multiple medical comorbidities. Long discussion with the patient's sister, will involve palliative care-to delineate further goals of care.Marland Kitchen  DVT Prophylaxis: Prophylactic Heparin   Code Status: Full code   Family Communication: Sister at bedside  Disposition Plan: Remain inpatient-SNF on discharge-probably next week  Antimicrobial agents: Anti-infectives    Start     Dose/Rate Route Frequency Ordered Stop   11/28/16 1530  piperacillin-tazobactam (ZOSYN) IVPB 3.375 g     3.375 g 12.5 mL/hr over 240 Minutes Intravenous Every 8 hours 11/28/16 1518     11/28/16 1530  vancomycin (VANCOCIN) IVPB 750 mg/150 ml premix     750 mg 150 mL/hr over 60 Minutes Intravenous Every 12 hours 11/28/16 1518     11/28/16 1520  ceFAZolin (ANCEF) IVPB 2g/100 mL premix  Status:  Discontinued     2 g 200 mL/hr over 30 Minutes Intravenous To ShortStay Surgical 11/28/16 1247 11/28/16 2334   11/28/16 1000  piperacillin-tazobactam (ZOSYN) IVPB 3.375 g  Status:  Discontinued     3.375 g 12.5 mL/hr over 240 Minutes Intravenous Every 8 hours 11/28/16 0832 11/28/16 1518    11/28/16 1000  vancomycin (VANCOCIN) IVPB 750 mg/150 ml premix  Status:  Discontinued     750 mg 150 mL/hr over 60 Minutes Intravenous Every 12 hours 11/28/16 0947 11/28/16 1518      Procedures: Echo 2/5 - Very limited image quality, however LVEF is severely impaired,   estimated at 25-30% with akinesis of all of the apical segments   and mid anteroseptal, anterior and anterolateral segments.   There is no thrombus on the contrast images.   A cardiac MRI is recommended for further LVEF evaluation.   CONSULTS:  cardiology and orthopedic surgery  Time spent: 25- minutes-Greater than 50% of this time was spent in counseling, explanation of diagnosis, planning of further management, and coordination of care.  MEDICATIONS: Scheduled Meds: . aspirin EC  81 mg Oral Daily  . atorvastatin  40 mg Oral QHS  . busPIRone  10 mg Oral BID  . Chlorhexidine Gluconate Cloth  6 each Topical Q0600  . divalproex  125 mg Oral TID  . famotidine  20 mg Oral BID  . FLUoxetine  60 mg Oral Daily  . hydrALAZINE  12.5 mg Oral q12n4p  . insulin aspart  0-9 Units Subcutaneous TID WC  . ipratropium-albuterol  3 mL Nebulization Q6H  . mouth rinse  15 mL Mouth Rinse BID  . metoprolol succinate  25 mg Oral Daily  . mupirocin ointment  1 application Nasal BID  . nicotine  21 mg Transdermal Daily  . piperacillin-tazobactam (ZOSYN)  IV  3.375 g Intravenous Q8H  . polyethylene glycol  17 g Oral Daily  . [START ON 12/01/2016] predniSONE  40 mg Oral Q breakfast  . pregabalin  75 mg Oral Daily  . senna-docusate  1 tablet Oral BID  . vancomycin  750 mg Intravenous Q12H   Continuous Infusions: PRN Meds:.acetaminophen, haloperidol lactate, methocarbamol, morphine injection, oxyCODONE-acetaminophen, RESOURCE THICKENUP CLEAR   PHYSICAL EXAM: Vital signs: Vitals:   11/30/16 0500 11/30/16 0804 11/30/16 0931 11/30/16 1107  BP:  122/69  126/80  Pulse:  76  89  Resp:      Temp:  98.8 F (37.1 C)  98 F (36.7 C)    TempSrc:  Oral  Oral  SpO2:  93% 93% 96%  Weight: 115.1 kg (253 lb 12 oz)     Height:       Filed Weights   11/26/16 2359 11/29/16 0352 11/30/16 0500  Weight: 102.1 kg (225 lb) 115 kg (253 lb 8.5 oz) 115.1 kg (253 lb 12 oz)   Body mass index is 32.58 kg/m.   General appearance :Awake, alert, not in any distress. Speech Clear. Eyes:, pupils equally reactive to light and accomodation,no scleral icterus. HEENT: Atraumatic and Normocephalic Neck: supple, no JVD. No cervical lymphadenopathy. No thyromegaly Resp:Good air entry bilaterally, diffuse rales all over CVS: S1 S2 regular GI: Bowel sounds present, Non tender and not distended with no gaurding, rigidity or rebound.No organomegaly Extremities: B/L Lower Ext shows no edema, both legs are warm to touch Neurology: Left hemiplegia Musculoskeletal:No digital cyanosis Skin:No Rash, warm and dry Wounds:N/A  I have personally reviewed following labs and imaging studies  LABORATORY DATA: CBC:  Recent Labs Lab 11/27/16 0000 11/28/16 0838 11/29/16 0357 11/30/16 0523  WBC 14.9* 14.0* 14.0* 23.4*  NEUTROABS 9.4* 8.4* 12.4*  --   HGB 15.2 15.6 14.6 13.5  HCT 45.4  44.9 43.3 40.0  MCV 97.6 97.6 98.0 96.4  PLT 174 131* 152 169    Basic Metabolic Panel:  Recent Labs Lab 11/27/16 0000 11/27/16 2021 11/28/16 0838 11/28/16 1012 11/29/16 0357 11/30/16 0523  NA 138 142 138  --  136 139  K 4.3 4.1 4.5  --  4.1 3.9  CL 102 100* 104  --  101 101  CO2 24 23 22   --  23 24  GLUCOSE 137* 37* 118*  --  212* 193*  BUN 23* 25* 26*  --  32* 43*  CREATININE 1.97* 1.61* 1.58*  --  1.82* 1.83*  CALCIUM 9.0 9.2 8.7*  --  9.0 9.2  MG  --   --   --  1.8 1.8 2.0    GFR: Estimated Creatinine Clearance: 57.2 mL/min (by C-G formula based on SCr of 1.83 mg/dL (H)).  Liver Function Tests:  Recent Labs Lab 11/27/16 0000 11/28/16 0838 11/29/16 0357  AST 32 22 27  ALT 29 18 17   ALKPHOS 82 70 61  BILITOT 0.7 0.5 0.9  PROT 7.1 7.0 7.1   ALBUMIN 3.1* 2.6* 2.4*   No results for input(s): LIPASE, AMYLASE in the last 168 hours. No results for input(s): AMMONIA in the last 168 hours.  Coagulation Profile:  Recent Labs Lab 11/27/16 0000 11/28/16 1605 11/29/16 0357 11/30/16 0523  INR 2.31 2.49 2.46 1.31    Cardiac Enzymes:  Recent Labs Lab 11/27/16 0000 11/27/16 0346 11/27/16 1557 11/27/16 2022  TROPONINI 0.03* 0.04* 0.03* 0.05*    BNP (last 3 results) No results for input(s): PROBNP in the last 8760 hours.  HbA1C: No results for input(s): HGBA1C in the last 72 hours.  CBG:  Recent Labs Lab 11/29/16 1213 11/29/16 1616 11/29/16 2101 11/30/16 0806 11/30/16 1109  GLUCAP 210* 221* 279* 141* 232*    Lipid Profile: No results for input(s): CHOL, HDL, LDLCALC, TRIG, CHOLHDL, LDLDIRECT in the last 72 hours.  Thyroid Function Tests: No results for input(s): TSH, T4TOTAL, FREET4, T3FREE, THYROIDAB in the last 72 hours.  Anemia Panel: No results for input(s): VITAMINB12, FOLATE, FERRITIN, TIBC, IRON, RETICCTPCT in the last 72 hours.  Urine analysis:    Component Value Date/Time   COLORURINE YELLOW 11/27/2016 0355   APPEARANCEUR CLEAR 11/27/2016 0355   LABSPEC 1.015 11/27/2016 0355   PHURINE 6.0 11/27/2016 0355   GLUCOSEU NEGATIVE 11/27/2016 0355   HGBUR LARGE (A) 11/27/2016 0355   BILIRUBINUR NEGATIVE 11/27/2016 0355   KETONESUR NEGATIVE 11/27/2016 0355   PROTEINUR 100 (A) 11/27/2016 0355   NITRITE NEGATIVE 11/27/2016 0355   LEUKOCYTESUR NEGATIVE 11/27/2016 0355    Sepsis Labs: Lactic Acid, Venous    Component Value Date/Time   LATICACIDVEN 1.9 11/28/2016 2023    MICROBIOLOGY: Recent Results (from the past 240 hour(s))  Surgical pcr screen     Status: Abnormal   Collection Time: 11/27/16  3:58 PM  Result Value Ref Range Status   MRSA, PCR POSITIVE (A) NEGATIVE Final   Staphylococcus aureus POSITIVE (A) NEGATIVE Final    Comment:        The Xpert SA Assay (FDA approved for NASAL  specimens in patients over 2 years of age), is one component of a comprehensive surveillance program.  Test performance has been validated by [facility] for patients greater than or equal to 28 year old. It is not intended to diagnose infection nor to guide or monitor treatment. RESULT CALLED TO, READ BACK BY AND VERIFIED WITH: C FOUSHEE,RN AT 1818 11/27/16  BY L BENFIELD   Culture, blood (routine x 2)     Status: None (Preliminary result)   Collection Time: 11/27/16  9:52 PM  Result Value Ref Range Status   Specimen Description BLOOD RIGHT HAND  Final   Special Requests BOTTLES DRAWN AEROBIC AND ANAEROBIC  5CC EA  Final   Culture NO GROWTH 2 DAYS  Final   Report Status PENDING  Incomplete  Culture, blood (routine x 2)     Status: None (Preliminary result)   Collection Time: 11/27/16  9:52 PM  Result Value Ref Range Status   Specimen Description BLOOD RIGHT HAND  Final   Special Requests BOTTLES DRAWN AEROBIC AND ANAEROBIC  5CC EA  Final   Culture NO GROWTH 1 DAY  Final   Report Status PENDING  Incomplete    RADIOLOGY STUDIES/RESULTS: Dg Chest 1 View  Result Date: 11/27/2016 CLINICAL DATA:  Larey Seat today.  Left hip fracture. EXAM: CHEST 1 VIEW COMPARISON:  02/24/2016 FINDINGS: Diffuse interstitial coarsening is probably chronic. No consolidation. No effusion. No pneumothorax. New unchanged hilar, mediastinal and cardiac contours. IMPRESSION: Chronic appearing interstitial coarsening. No consolidation or effusion. Electronically Signed   By: Ellery Plunk M.D.   On: 11/27/2016 01:14   Dg Pelvis 1-2 Views  Result Date: 11/27/2016 CLINICAL DATA:  Larey Seat today while trying to get out of bed unassisted. EXAM: PELVIS - 1-2 VIEW COMPARISON:  None. FINDINGS: There is an acute subcapital left hip fracture. No dislocation. No bone lesion or bony destruction. Pelvis appears intact. IMPRESSION: Subcapital left hip fracture Electronically Signed   By: Ellery Plunk M.D.   On: 11/27/2016 01:13     Ct Head Wo Contrast  Result Date: 11/27/2016 CLINICAL DATA:  Larey Seat getting out of bed today, acute LEFT hip fracture. History of hypertension, stroke. EXAM: CT HEAD WITHOUT CONTRAST TECHNIQUE: Contiguous axial images were obtained from the base of the skull through the vertex without intravenous contrast. COMPARISON:  CT HEAD January 26, 2016 FINDINGS: BRAIN: No intraparenchymal hemorrhage, mass effect, midline shift or acute large vascular territory infarcts. Old RIGHT greater than LEFT cerebellar infarcts, severe brachium contrast atrophy. Old RIGHT greater than LEFT pontine infarcts. Old LEFT thalamus and bilateral basal ganglia lacunar infarcts. Old small RIGHT occipital lobe infarct. LEFT inferior temporal lobe encephalomalacia is likely posttraumatic. Mild to moderate white matter changes compatible with chronic small vessel ischemic disease. No abnormal extra-axial fluid collection. Moderate ventriculomegaly on the basis of global parenchymal brain volume loss. VASCULAR: Mild to moderate calcific atherosclerosis of the carotid siphons. SKULL: No skull fracture. No significant scalp soft tissue swelling. SINUSES/ORBITS: Bilateral maxillary mucosal retention cysts. Mastoid air cells are well aerated. The included ocular globes and orbital contents are non-suspicious. OTHER: Patient is edentulous. IMPRESSION: No acute intracranial process. Stable examination including numerous old supra- and infratentorial infarcts predominately within posterior circulation, moderate global brain atrophy. Electronically Signed   By: Awilda Metro M.D.   On: 11/27/2016 03:01   Dg Chest Port 1 View  Result Date: 11/28/2016 CLINICAL DATA:  Shortness of breath. EXAM: PORTABLE CHEST 1 VIEW COMPARISON:  11/27/2016 and 02/24/2016 FINDINGS: The patient has progressive bilateral pulmonary infiltrates which may represent pulmonary edema or progressive pneumonia. Pulmonary vascularity appears normal in the overall heart size is  normal. IMPRESSION: Probable progressive bilateral pneumonia. Electronically Signed   By: Francene Boyers M.D.   On: 11/28/2016 15:31   Dg Chest Port 1 View  Result Date: 11/27/2016 CLINICAL DATA:  sob EXAM: PORTABLE CHEST 1 VIEW COMPARISON:  11/27/2016 FINDINGS: The heart is enlarged. There are airspace filling opacities bilaterally, particularly well seen at the left lung base. Patient is rotated. IMPRESSION: Bilateral airspace filling opacities consistent with pulmonary edema and/or infectious process. Electronically Signed   By: Norva Pavlov M.D.   On: 11/27/2016 22:17   Dg Chest Port 1v Same Day  Result Date: 11/30/2016 CLINICAL DATA:  Shortness of breath, cough today, pneumonia, history hypertension, prior respiratory failure, coronary disease, chronic systolic CHF, ischemic cardiomyopathy EXAM: PORTABLE CHEST 1 VIEW COMPARISON:  Portable exam 0922 hours compared to 11/28/2016 FINDINGS: Rotated to the LEFT with kyphotic positioning. Enlargement of cardiac silhouette. Stable mediastinal contours for degree of rotation. Extensive BILATERAL pulmonary infiltrates favor pneumonia over edema. No pleural effusion or pneumothorax. Bones unremarkable. IMPRESSION: Persistent BILATERAL pulmonary infiltrates question pneumonia. Enlargement of cardiac silhouette. Electronically Signed   By: Ulyses Southward M.D.   On: 11/30/2016 09:31   Dg Femur Min 2 Views Left  Result Date: 11/27/2016 CLINICAL DATA:  Larey Seat while attempting to get out of bed unassisted today. EXAM: LEFT FEMUR 2 VIEWS COMPARISON:  None. FINDINGS: There is a subcapital left hip fracture. Remainder of the femur is intact. No dislocation. No bone lesion or bony destruction. IMPRESSION: Subcapital left hip fracture Electronically Signed   By: Ellery Plunk M.D.   On: 11/27/2016 01:13     LOS: 3 days   Jeoffrey Massed, MD  [facility] Pager:[phone]  If 7PM-7AM, please contact night-coverage [email or web address] Password [email or web address] 11/30/2016,  1:23 PM

## 2016-11-30 NOTE — Progress Notes (Signed)
Patient is currently on [facility] with sats of 96%. Patient is in no distress and all vitals are stable. BIPAP is not needed at this time. Will continue to monitor.

## 2016-11-30 NOTE — Progress Notes (Signed)
Pasrr received: [id number]   Shacarra Choe LCSWA [phone]  

## 2016-11-30 NOTE — Care Management Important Message (Signed)
Important Message  Patient Details  Name: Curtis Keller MRN: [id number] Date of Birth: 28-May-1956   Medicare Important Message Given:  Yes    Hanley Hays, RN 11/30/2016, 9:33 AM

## 2016-11-30 NOTE — Clinical Social Work Note (Signed)
Clinical Social Work Assessment  Patient Details  Name: Curtis Keller MRN: [id number] Date of Birth: [birth year]  Date of referral:  11/30/16               Reason for consult:  Discharge Planning                Permission sought to share information with:  Facility Medical sales representative, Family Supports Permission granted to share information::  Yes, Verbal Permission Granted  Name::     Programmer, systems::  [employer]  Relationship::  Sister  Contact Information:  [phone]  Housing/Transportation Living arrangements for the past 2 months:  Skilled Nursing Facility Source of Information:  Other (Comment Required) (Sister) Patient Interpreter Needed:  None Criminal Activity/Legal Involvement Pertinent to Current Situation/Hospitalization:  No - Comment as needed Significant Relationships:  Siblings, Adult Children Lives with:  Facility Resident Do you feel safe going back to the place where you live?  Yes Need for family participation in patient care:  Yes (Comment)  Care giving concerns:  CSW received consult regarding discharge plan. CSW spoke with patient's sister and patient. Patient was pleasant but disoriented. Sister reported that patient came from [facility] and will return there at discharge. CSW to continue to follow and assist with discharge planning needs.   Social Worker assessment / plan:  CSW spoke with patient's sister concerning return to [place] when medically stable.  Employment status:  Disabled (Comment on whether or not currently receiving Disability) Insurance information:  Medicare PT Recommendations:  Not assessed at this time Information / Referral to community resources:  Skilled Nursing Facility  Patient/Family's Response to care:  Patient's sister agreed with discharge plan and would like to use PTAR.   Patient/Family's Understanding of and Emotional Response to Diagnosis, Current Treatment, and Prognosis:  Patient/family is realistic regarding  therapy needs and expressed being hopeful for improvement medically after surgery. Patient's sister expressed understanding of CSW role and discharge process. No questions/concerns about plan or treatment.    Emotional Assessment Appearance:  Appears stated age Attitude/Demeanor/Rapport:  Other (Disoriented but pleasant) Affect (typically observed):  Pleasant Orientation:  Oriented to Self Alcohol / Substance use:  Not Applicable Psych involvement (Current and /or in the community):  No (Comment)  Discharge Needs  Concerns to be addressed:  Care Coordination Readmission within the last 30 days:  No Current discharge risk:  None Barriers to Discharge:  Continued Medical Work up   [employer], LCSWA 11/30/2016, 11:25 AM

## 2016-11-30 NOTE — Progress Notes (Signed)
Modified Barium Swallow Progress Note  Patient Details  Name: Curtis Keller MRN: [id number] Date of Birth: 25-Dec-1955  Today's Date: 11/30/2016  Modified Barium Swallow completed.  Full report located under Chart Review in the Imaging Section.  Brief recommendations include the following:  Clinical Impression  Pt demosntrates a severe oropharygneal dysphagia with decreased duration and magnitude of hyolaryngeal excursion leading to severe oropharyngeal residuals that are aspirated with and without sensation in further swallow attempts. Base of tongue propulsion of bolus is also poor and epiglottis does not deflect for airway protection. Thin liquids pose a severe risk of aspiration and nectar thick liquids reduce severity of aspiration events, but are still likely to be aspirated due to increased residuals with thick viscosity. A chin tuck significantly improves bolus transit if fully tucked against chest. Would suspect that carry over for this will be poor, but will trial dys 3 solids and nectar thick liquids with full supervision for a chin tuck to determine if it is achievable.    Swallow Evaluation Recommendations       SLP Diet Recommendations: Dysphagia 3 (Mech soft) solids;Nectar thick liquid   Liquid Administration via: Cup;Straw   Medication Administration: Whole meds with puree   Supervision: Staff to assist with self feeding;Full supervision/cueing for compensatory strategies   Compensations: Chin tuck;Slow rate;Small sips/bites;Minimize environmental distractions   Postural Changes: Remain semi-upright after after feeds/meals (Comment);Seated upright at 90 degrees   Oral Care Recommendations: Oral care BID   Other Recommendations: Order thickener from pharmacy    [place] [employer], Riley Nearing 11/30/2016,2:49 PM

## 2016-12-01 ENCOUNTER — Inpatient Hospital Stay (HOSPITAL_COMMUNITY): Payer: Medicare Other

## 2016-12-01 DIAGNOSIS — Z515 Encounter for palliative care: Secondary | ICD-10-CM

## 2016-12-01 DIAGNOSIS — Z7189 Other specified counseling: Secondary | ICD-10-CM

## 2016-12-01 DIAGNOSIS — I255 Ischemic cardiomyopathy: Secondary | ICD-10-CM

## 2016-12-01 DIAGNOSIS — S72002A Fracture of unspecified part of neck of left femur, initial encounter for closed fracture: Secondary | ICD-10-CM

## 2016-12-01 LAB — BASIC METABOLIC PANEL
ANION GAP: 13 (ref 5–15)
BUN: 49 mg/dL — ABNORMAL HIGH (ref 6–20)
CALCIUM: 9.4 mg/dL (ref 8.9–10.3)
CO2: 27 mmol/L (ref 22–32)
Chloride: 101 mmol/L (ref 101–111)
Creatinine, Ser: 1.57 mg/dL — ABNORMAL HIGH (ref 0.61–1.24)
GFR, EST AFRICAN AMERICAN: 53 mL/min — AB (ref >60)
GFR, EST NON AFRICAN AMERICAN: 46 mL/min — AB (ref >60)
Glucose, Bld: 139 mg/dL — ABNORMAL HIGH (ref 65–99)
Potassium: 4 mmol/L (ref 3.5–5.1)
Sodium: 141 mmol/L (ref 135–145)

## 2016-12-01 LAB — CBC
HEMATOCRIT: 40.4 % (ref 39.0–52.0)
Hemoglobin: 14 g/dL (ref 13.0–17.0)
MCH: 33.1 pg (ref 26.0–34.0)
MCHC: 34.7 g/dL (ref 30.0–36.0)
MCV: 95.5 fL (ref 78.0–100.0)
PLATELETS: 178 10*3/uL (ref 150–400)
RBC: 4.23 MIL/uL (ref 4.22–5.81)
RDW: 14.9 % (ref 11.5–15.5)
WBC: 18 10*3/uL — AB (ref 4.0–10.5)

## 2016-12-01 LAB — PROTIME-INR
INR: 1.2
Prothrombin Time: 15.3 seconds — ABNORMAL HIGH (ref 11.4–15.2)

## 2016-12-01 LAB — GLUCOSE, CAPILLARY
GLUCOSE-CAPILLARY: 185 mg/dL — AB (ref 65–99)
GLUCOSE-CAPILLARY: 221 mg/dL — AB (ref 65–99)
GLUCOSE-CAPILLARY: 152 mg/dL — AB (ref 65–99)
Glucose-Capillary: 148 mg/dL — ABNORMAL HIGH (ref 65–99)

## 2016-12-01 MED ORDER — PREDNISONE 20 MG PO TABS
20.0000 mg | ORAL_TABLET | Freq: Every day | ORAL | Status: DC
  Administered 2016-12-02: 20 mg via ORAL
  Filled 2016-12-01 (×2): qty 1

## 2016-12-01 MED ORDER — FUROSEMIDE 10 MG/ML IJ SOLN
80.0000 mg | Freq: Two times a day (BID) | INTRAMUSCULAR | Status: DC
  Administered 2016-12-01 (×2): 80 mg via INTRAVENOUS
  Filled 2016-12-01: qty 8

## 2016-12-01 MED ORDER — POTASSIUM CHLORIDE CRYS ER 20 MEQ PO TBCR
20.0000 meq | EXTENDED_RELEASE_TABLET | Freq: Every day | ORAL | Status: DC
  Administered 2016-12-02 – 2016-12-13 (×12): 20 meq via ORAL
  Filled 2016-12-01 (×12): qty 1

## 2016-12-01 MED ORDER — FUROSEMIDE 10 MG/ML IJ SOLN
80.0000 mg | Freq: Once | INTRAMUSCULAR | Status: DC
  Filled 2016-12-01: qty 8

## 2016-12-01 MED ORDER — SODIUM CHLORIDE 0.9 % IV SOLN
INTRAVENOUS | Status: DC
  Administered 2016-12-01 – 2016-12-10 (×4): via INTRAVENOUS

## 2016-12-01 MED ORDER — IPRATROPIUM-ALBUTEROL 0.5-2.5 (3) MG/3ML IN SOLN
3.0000 mL | Freq: Three times a day (TID) | RESPIRATORY_TRACT | Status: DC
  Administered 2016-12-02 – 2016-12-10 (×25): 3 mL via RESPIRATORY_TRACT
  Filled 2016-12-01 (×26): qty 3

## 2016-12-01 MED ORDER — ENSURE ENLIVE PO LIQD
237.0000 mL | Freq: Two times a day (BID) | ORAL | Status: DC
  Administered 2016-12-01 – 2016-12-08 (×8): 237 mL via ORAL

## 2016-12-01 NOTE — Progress Notes (Addendum)
PROGRESS NOTE        PATIENT DETAILS Name: Curtis Keller Age: 61 y.o. Sex: male Date of Birth: 09-07-1956 Admit Date: 11/26/2016 Admitting Physician Clydie Braun, MD ZOX:[id number], Maxine Glenn, DO  Brief Narrative: Patient is a 61 y.o. male with history chronic systolic heart failure (EF 25-30%), history of LV thrombus, prior CVA with chronic left-sided hemiplegia-multiple falls over the past few years causing splenic rupture-presented to the hospital on 2/3 following a mechanical fall, found to have a left hip fracture. Unfortunately hospital course has been complicated by development of acute hypoxic respiratory failure, felt due to aspiration PNA vs acute systolic CHF.   Subjective: Feels better,down to around 3L L of oxygen.  Assessment/Plan: Closed hip fracture: Secondary to a mechanical fall, although he developed acute hypoxic respiratory failure and continues to have diffuse pulmonary infiltrates on his x-ray, his oxygen requirements are coming down. He also appears to be stable and not in any acute respiratory distress. However he does have a hip fracture that will need to be fixed-Difficult Situation-spoke with Orthopedic Team will Speak with Family, Review X-Rays and will get back in touch with me.  Acute hypoxemic respiratory failure secondary to  aspiration pneumonia AND acute on chronic systolic heart failure: Apparently had a aspiration event on 2/5-subsequently moved to stepdown and started on empiric antimicrobial therapy-blood cultures continue to be negative, suspect we can discontinue vancomycin and only continue on Zosyn. Patient claims that his dry weight is around 225 pounds, however today it is up to 253 pounds-but no leg edema-he was empirically started on intravenous Lasix, his creatinine has actually improved-we will go ahead and give him another dose of Lasix 80 mg IV today. Will continue to keep in negative balance (output inaccurate-patient  keeps putting out condom catheter-foley being inserted today) and continue intravenous diuretics as long as renal function tolerates. We will continue to taper down steroids-do not think patient had COPD exacerbation.  Dysphagia: Speech therapy following, underwent modified barium swallow-this is a chronic issue-patient has a prior history of CVA-he previously had a tube in place. Recommendations are to try a dysphagia 3 solids with nectar thick liquids. Will discuss with family over the next few days.  History of left apical thrombus: Previously on Coumadin-currently on hold for surgery. Given high risk for venous thromboembolism post surgery, suspect will require short-term anticoagulation. Long discussion with patient's sister at bedside on 2/7, she would prefer short-term anticoagulation and is willing to accept all risks.  Acute kidney injury: Improving-Suspect prerenal azotemia-hemodynamic mediated injury  History of CVA with chronic left sided weakness: Previously on anticoagulation-left-sided deficits are unchanged. Continue statin. See above regarding anticoagulation  GERD: Continue Pepcid  Type 2 diabetes: CBGs stable-continue SSI  History of anxiety/depression/mild dementia: Continue Depakote, fluoxetine and BuSpar.  Frequent falls: Has had frequent falls in the past few years-one episode of fall resulted in a splenic laceration requiring splenectomy. Unfortunately, patient also has a history of LV thrombus and a CVA with left hemiplegia. Although he is a poor long term candidate, he is at significant risk of having venous thromboembolism post-hip surgery. Long discussion with the patient's sister on 2/7(who is a nurse at bedside)-she is agreeable to restart anticoagulation for the short-term.  Palliative care: Remains a full code, poor long-term prognosis-multiple medical comorbidities. Long discussion with the patient's sister, will involve palliative care-to delineate  further goals of  care.Marland Kitchen  DVT Prophylaxis: Prophylactic Heparin   Code Status: Full code   Family Communication: None at bedside  Disposition Plan: Remain inpatient-remain in SDU-SNF on discharge-probably next week  Antimicrobial agents: Anti-infectives    Start     Dose/Rate Route Frequency Ordered Stop   11/28/16 1530  piperacillin-tazobactam (ZOSYN) IVPB 3.375 g     3.375 g 12.5 mL/hr over 240 Minutes Intravenous Every 8 hours 11/28/16 1518 12/04/16 2359   11/28/16 1530  vancomycin (VANCOCIN) IVPB 750 mg/150 ml premix  Status:  Discontinued     750 mg 150 mL/hr over 60 Minutes Intravenous Every 12 hours 11/28/16 1518 12/01/16 1031   11/28/16 1520  ceFAZolin (ANCEF) IVPB 2g/100 mL premix  Status:  Discontinued     2 g 200 mL/hr over 30 Minutes Intravenous To [facility] 11/28/16 1247 11/28/16 2334   11/28/16 1000  piperacillin-tazobactam (ZOSYN) IVPB 3.375 g  Status:  Discontinued     3.375 g 12.5 mL/hr over 240 Minutes Intravenous Every 8 hours 11/28/16 0832 11/28/16 1518   11/28/16 1000  vancomycin (VANCOCIN) IVPB 750 mg/150 ml premix  Status:  Discontinued     750 mg 150 mL/hr over 60 Minutes Intravenous Every 12 hours 11/28/16 0832 11/28/16 1518      Procedures: Echo 2/5 - Very limited image quality, however LVEF is severely impaired,   estimated at 25-30% with akinesis of all of the apical segments   and mid anteroseptal, anterior and anterolateral segments.   There is no thrombus on the contrast images.   A cardiac MRI is recommended for further LVEF evaluation.   CONSULTS:  cardiology and orthopedic surgery  Time spent: 25- minutes-Greater than 50% of this time was spent in counseling, explanation of diagnosis, planning of further management, and coordination of care.  MEDICATIONS: Scheduled Meds: . aspirin EC  81 mg Oral Daily  . atorvastatin  40 mg Oral QHS  . busPIRone  10 mg Oral BID  . Chlorhexidine Gluconate Cloth  6 each Topical Q0600  . divalproex  125  mg Oral TID  . famotidine  20 mg Oral BID  . feeding supplement (ENSURE ENLIVE)  237 mL Oral BID BM  . FLUoxetine  60 mg Oral Daily  . furosemide  80 mg Intravenous Once  . heparin subcutaneous  5,000 Units Subcutaneous Q8H  . hydrALAZINE  12.5 mg Oral q12n4p  . insulin aspart  0-9 Units Subcutaneous TID WC  . ipratropium-albuterol  3 mL Nebulization Q6H  . mouth rinse  15 mL Mouth Rinse BID  . metoprolol succinate  25 mg Oral Daily  . mupirocin ointment  1 application Nasal BID  . nicotine  21 mg Transdermal Daily  . piperacillin-tazobactam (ZOSYN)  IV  3.375 g Intravenous Q8H  . polyethylene glycol  17 g Oral Daily  . predniSONE  40 mg Oral Q breakfast  . pregabalin  75 mg Oral Daily  . senna-docusate  1 tablet Oral BID   Continuous Infusions: PRN Meds:.acetaminophen, haloperidol lactate, methocarbamol, morphine injection, oxyCODONE-acetaminophen, RESOURCE THICKENUP CLEAR   PHYSICAL EXAM: Vital signs: Vitals:   12/01/16 0249 12/01/16 0411 12/01/16 0806 12/01/16 0900  BP:  118/81 102/73   Pulse:   72   Resp:  (!) 22 13   Temp:  99.2 F (37.3 C) 98.1 F (36.7 C)   TempSrc:  Oral Axillary   SpO2: 95%  95% 91%  Weight:      Height:       Filed  Weights   11/26/16 2359 11/29/16 0352 11/30/16 0500  Weight: 102.1 kg (225 lb) 115 kg (253 lb 8.5 oz) 115.1 kg (253 lb 12 oz)   Body mass index is 32.58 kg/m.   General appearance :Awake, alert, not in any distress. Speech Clear. Eyes:, pupils equally reactive to light and accomodation,no scleral icterus. HEENT: Atraumatic and Normocephalic Neck: supple, no JVD. No cervical lymphadenopathy. No thyromegaly Resp:Good air entry bilaterally, diffuse rales mostly at bilateral bases CVS: S1 S2 regular GI: Bowel sounds present, Non tender and not distended with no gaurding, rigidity or rebound.No organomegaly Extremities: B/L Lower Ext shows no edema, both legs are warm to touch Neurology: Left hemiplegia Musculoskeletal:No digital  cyanosis Skin:No Rash, warm and dry Wounds:N/A  I have personally reviewed following labs and imaging studies  LABORATORY DATA: CBC:  Recent Labs Lab 11/27/16 0000 11/28/16 0838 11/29/16 0357 11/30/16 0523 12/01/16 0224  WBC 14.9* 14.0* 14.0* 23.4* 18.0*  NEUTROABS 9.4* 8.4* 12.4*  --   --   HGB 15.2 15.6 14.6 13.5 14.0  HCT 45.4 44.9 43.3 40.0 40.4  MCV 97.6 97.6 98.0 96.4 95.5  PLT 174 131* 152 169 178    Basic Metabolic Panel:  Recent Labs Lab 11/27/16 2021 11/28/16 0838 11/28/16 1012 11/29/16 0357 11/30/16 0523 12/01/16 0224  NA 142 138  --  136 139 141  K 4.1 4.5  --  4.1 3.9 4.0  CL 100* 104  --  101 101 101  CO2 23 22  --  23 24 27   GLUCOSE 37* 118*  --  212* 193* 139*  BUN 25* 26*  --  32* 43* 49*  CREATININE 1.61* 1.58*  --  1.82* 1.83* 1.57*  CALCIUM 9.2 8.7*  --  9.0 9.2 9.4  MG  --   --  1.8 1.8 2.0  --     GFR: Estimated Creatinine Clearance: 66.7 mL/min (by C-G formula based on SCr of 1.57 mg/dL (H)).  Liver Function Tests:  Recent Labs Lab 11/27/16 0000 11/28/16 0838 11/29/16 0357  AST 32 22 27  ALT 29 18 17   ALKPHOS 82 70 61  BILITOT 0.7 0.5 0.9  PROT 7.1 7.0 7.1  ALBUMIN 3.1* 2.6* 2.4*   No results for input(s): LIPASE, AMYLASE in the last 168 hours. No results for input(s): AMMONIA in the last 168 hours.  Coagulation Profile:  Recent Labs Lab 11/27/16 0000 11/28/16 1605 11/29/16 0357 11/30/16 0523 12/01/16 0224  INR 2.31 2.49 2.46 1.31 1.20    Cardiac Enzymes:  Recent Labs Lab 11/27/16 0000 11/27/16 0346 11/27/16 1557 11/27/16 2022  TROPONINI 0.03* 0.04* 0.03* 0.05*    BNP (last 3 results) No results for input(s): PROBNP in the last 8760 hours.  HbA1C: No results for input(s): HGBA1C in the last 72 hours.  CBG:  Recent Labs Lab 11/30/16 0806 11/30/16 1109 11/30/16 1612 11/30/16 2112 12/01/16 0804  GLUCAP 141* 232* 159* 188* 148*    Lipid Profile: No results for input(s): CHOL, HDL, LDLCALC,  TRIG, CHOLHDL, LDLDIRECT in the last 72 hours.  Thyroid Function Tests: No results for input(s): TSH, T4TOTAL, FREET4, T3FREE, THYROIDAB in the last 72 hours.  Anemia Panel: No results for input(s): VITAMINB12, FOLATE, FERRITIN, TIBC, IRON, RETICCTPCT in the last 72 hours.  Urine analysis:    Component Value Date/Time   COLORURINE YELLOW 11/27/2016 0355   APPEARANCEUR CLEAR 11/27/2016 0355   LABSPEC 1.015 11/27/2016 0355   PHURINE 6.0 11/27/2016 0355   GLUCOSEU NEGATIVE 11/27/2016 0355   HGBUR  LARGE (A) 11/27/2016 0355   BILIRUBINUR NEGATIVE 11/27/2016 0355   KETONESUR NEGATIVE 11/27/2016 0355   PROTEINUR 100 (A) 11/27/2016 0355   NITRITE NEGATIVE 11/27/2016 0355   LEUKOCYTESUR NEGATIVE 11/27/2016 0355    Sepsis Labs: Lactic Acid, Venous    Component Value Date/Time   LATICACIDVEN 1.9 11/28/2016 2023    MICROBIOLOGY: Recent Results (from the past 240 hour(s))  Surgical pcr screen     Status: Abnormal   Collection Time: 11/27/16  3:58 PM  Result Value Ref Range Status   MRSA, PCR POSITIVE (A) NEGATIVE Final   Staphylococcus aureus POSITIVE (A) NEGATIVE Final    Comment:        The Xpert SA Assay (FDA approved for NASAL specimens in patients over 6 years of age), is one component of a comprehensive surveillance program.  Test performance has been validated by [facility] for patients greater than or equal to 75 year old. It is not intended to diagnose infection nor to guide or monitor treatment. RESULT CALLED TO, READ BACK BY AND VERIFIED WITH: C FOUSHEE,RN AT 1818 11/27/16 BY L BENFIELD   Culture, blood (routine x 2)     Status: None (Preliminary result)   Collection Time: 11/27/16  9:52 PM  Result Value Ref Range Status   Specimen Description BLOOD RIGHT HAND  Final   Special Requests BOTTLES DRAWN AEROBIC AND ANAEROBIC  5CC EA  Final   Culture NO GROWTH 3 DAYS  Final   Report Status PENDING  Incomplete  Culture, blood (routine x 2)     Status: None  (Preliminary result)   Collection Time: 11/27/16  9:52 PM  Result Value Ref Range Status   Specimen Description BLOOD RIGHT HAND  Final   Special Requests BOTTLES DRAWN AEROBIC AND ANAEROBIC  5CC EA  Final   Culture NO GROWTH 2 DAYS  Final   Report Status PENDING  Incomplete    RADIOLOGY STUDIES/RESULTS: Dg Chest 1 View  Result Date: 11/27/2016 CLINICAL DATA:  Larey Seat today.  Left hip fracture. EXAM: CHEST 1 VIEW COMPARISON:  02/24/2016 FINDINGS: Diffuse interstitial coarsening is probably chronic. No consolidation. No effusion. No pneumothorax. New unchanged hilar, mediastinal and cardiac contours. IMPRESSION: Chronic appearing interstitial coarsening. No consolidation or effusion. Electronically Signed   By: Ellery Plunk M.D.   On: 11/27/2016 01:14   Dg Pelvis 1-2 Views  Result Date: 11/27/2016 CLINICAL DATA:  Larey Seat today while trying to get out of bed unassisted. EXAM: PELVIS - 1-2 VIEW COMPARISON:  None. FINDINGS: There is an acute subcapital left hip fracture. No dislocation. No bone lesion or bony destruction. Pelvis appears intact. IMPRESSION: Subcapital left hip fracture Electronically Signed   By: Ellery Plunk M.D.   On: 11/27/2016 01:13   Ct Head Wo Contrast  Result Date: 11/27/2016 CLINICAL DATA:  Larey Seat getting out of bed today, acute LEFT hip fracture. History of hypertension, stroke. EXAM: CT HEAD WITHOUT CONTRAST TECHNIQUE: Contiguous axial images were obtained from the base of the skull through the vertex without intravenous contrast. COMPARISON:  CT HEAD January 26, 2016 FINDINGS: BRAIN: No intraparenchymal hemorrhage, mass effect, midline shift or acute large vascular territory infarcts. Old RIGHT greater than LEFT cerebellar infarcts, severe brachium contrast atrophy. Old RIGHT greater than LEFT pontine infarcts. Old LEFT thalamus and bilateral basal ganglia lacunar infarcts. Old small RIGHT occipital lobe infarct. LEFT inferior temporal lobe encephalomalacia is likely  posttraumatic. Mild to moderate white matter changes compatible with chronic small vessel ischemic disease. No abnormal extra-axial fluid collection.  Moderate ventriculomegaly on the basis of global parenchymal brain volume loss. VASCULAR: Mild to moderate calcific atherosclerosis of the carotid siphons. SKULL: No skull fracture. No significant scalp soft tissue swelling. SINUSES/ORBITS: Bilateral maxillary mucosal retention cysts. Mastoid air cells are well aerated. The included ocular globes and orbital contents are non-suspicious. OTHER: Patient is edentulous. IMPRESSION: No acute intracranial process. Stable examination including numerous old supra- and infratentorial infarcts predominately within posterior circulation, moderate global brain atrophy. Electronically Signed   By: Awilda Metro M.D.   On: 11/27/2016 03:01   Dg Chest Port 1 View  Addendum Date: 12/01/2016   ADDENDUM REPORT: 12/01/2016 08:08 ADDENDUM: There is an old healed right clavicle fracture, stable. Electronically Signed   By: Bretta Bang III M.D.   On: 12/01/2016 08:08   Result Date: 12/01/2016 CLINICAL DATA:  Shortness of Breath EXAM: PORTABLE CHEST 1 VIEW COMPARISON:  November 30, 2016 FINDINGS: Extensive airspace consolidation bilaterally, most severe in the left upper to mid lung zones, up remains without significant change. There is cardiomegaly with pulmonary venous hypertension. No adenopathy evident. No bone lesions. IMPRESSION: Widespread airspace consolidation bilaterally, somewhat more on the left than on the right. Evidence a degree of underlying pulmonary vascular congestion. Question multifocal pneumonia versus pulmonary edema; both entities may well exist concurrently. The overall appearance of the lungs and cardiomediastinal silhouette is stable compared to 1 day prior. Electronically Signed: By: Bretta Bang III M.D. On: 12/01/2016 08:02   Dg Chest Port 1 View  Result Date: 11/28/2016 CLINICAL DATA:   Shortness of breath. EXAM: PORTABLE CHEST 1 VIEW COMPARISON:  11/27/2016 and 02/24/2016 FINDINGS: The patient has progressive bilateral pulmonary infiltrates which may represent pulmonary edema or progressive pneumonia. Pulmonary vascularity appears normal in the overall heart size is normal. IMPRESSION: Probable progressive bilateral pneumonia. Electronically Signed   By: Francene Boyers M.D.   On: 11/28/2016 15:31   Dg Chest Port 1 View  Result Date: 11/27/2016 CLINICAL DATA:  sob EXAM: PORTABLE CHEST 1 VIEW COMPARISON:  11/27/2016 FINDINGS: The heart is enlarged. There are airspace filling opacities bilaterally, particularly well seen at the left lung base. Patient is rotated. IMPRESSION: Bilateral airspace filling opacities consistent with pulmonary edema and/or infectious process. Electronically Signed   By: Norva Pavlov M.D.   On: 11/27/2016 22:17   Dg Chest Port 1v Same Day  Result Date: 11/30/2016 CLINICAL DATA:  Shortness of breath, cough today, pneumonia, history hypertension, prior respiratory failure, coronary disease, chronic systolic CHF, ischemic cardiomyopathy EXAM: PORTABLE CHEST 1 VIEW COMPARISON:  Portable exam 0922 hours compared to 11/28/2016 FINDINGS: Rotated to the LEFT with kyphotic positioning. Enlargement of cardiac silhouette. Stable mediastinal contours for degree of rotation. Extensive BILATERAL pulmonary infiltrates favor pneumonia over edema. No pleural effusion or pneumothorax. Bones unremarkable. IMPRESSION: Persistent BILATERAL pulmonary infiltrates question pneumonia. Enlargement of cardiac silhouette. Electronically Signed   By: Ulyses Southward M.D.   On: 11/30/2016 09:31   Dg Swallowing Func-speech Pathology  Result Date: 11/30/2016 Objective Swallowing Evaluation: Type of Study: MBS-Modified Barium Swallow Study Patient Details Name: QUALYN OYERVIDES MRN: [id number] Date of Birth: 01/13/1956 Today's Date: 11/30/2016 Time: SLP Start Time (ACUTE ONLY): 1310-SLP Stop Time  (ACUTE ONLY): 1325 SLP Time Calculation (min) (ACUTE ONLY): 15 min Past Medical History: Past Medical History: Diagnosis Date . Acute respiratory failure (HCC)   a. Spring 2017 following fall/splenectomy -->admitted to [facility]-->trach/g tube. . Anemia   a. 12/2015 ABL in setting of fall/hematomas/splenic laceration req splenectomy. Marland Kitchen Apical mural thrombus  a. 11/2015 Echo: EF 25-30%, moderate size apical thrombus-->coumadin;  b. 12/2015 f/u Echo: EF 25-30%, ant/septa HK, no obvious large thrombus but cannot exclude small mural thrombus. Marland Kitchen CAD (coronary artery disease)   a. 12/2014 s/p BMS to the RCA. Marland Kitchen Cerebral infarction (HCC)  . Chronic systolic CHF (congestive heart failure) (HCC)   a. 12/2015 Echo: EF 25-30%. . Dementia  . Depression  . Dysphagia  . Falls   a. 11/2015 traumatic fall with resultant trauma req splenectomy and prolonged hospitalization complicated by resp failure. Marland Kitchen History of pneumonia  . Hyperlipidemia  . Hypertension  . Ischemic cardiomyopathy   a. 12/2015 Echo: EF 25-30%, anterior and septal HK. Marland Kitchen Left hemiplegia (HCC)  . Protein calorie malnutrition (HCC)  . Respiratory failure (HCC)  . Spleen injury  . Status post tracheostomy (HCC)   a. 01/2016 in setting of ongoing resp failure and aspiration. Past Surgical History: Past Surgical History: Procedure Laterality Date . CARDIAC CATHETERIZATION   . CORONARY ANGIOPLASTY WITH STENT PLACEMENT   . IR GENERIC HISTORICAL  07/22/2016  IR GASTROSTOMY TUBE REMOVAL 07/22/2016 [name], MD [facility]-INTERV RAD . PR LAP, SPLENECTOMY  01/07/2016 . TRACHEOSTOMY TUBE PLACEMENT N/A 02/22/2016  Procedure: TRACHEOSTOMY;  Surgeon: [name], MD;  Location: [facility] OR;  Service: ENT;  Laterality: N/A; HPI: Pt. with PMH of HTN, chronic systolic CHF, chronic respiratory failure on 3L of oxygen, diabetes mellitus, recurrent fall, chronic anticoagulation, dyslipidemia; admitted on 11/26/2016, with complaint of A mechanical fall, was found to have closed left hip  fracture.Patient developed aspiration pneumonia in the hospital. Currently surgery is on hold until pneumonia is better. Per MD pt was on dys 2/nectar thick liquids at SNF, but has been on thin liquids here. Pt had an MBS during admission to [facility] in 2017, but record not available. Pt also had a trach at that time. Reproted has a history of dysphagia due to prior CVAs.  No Data Recorded Assessment / Plan / Recommendation CHL IP CLINICAL IMPRESSIONS 11/30/2016 Therapy Diagnosis Severe pharyngeal phase dysphagia Clinical Impression Pt demonstrates a severe oropharygneal dysphagia with decreased duration and magnitude of hyolaryngeal excursion leading to severe oropharyngeal residuals that are aspirated with and without sensation in further swallow attempts. Base of tongue propulsion of bolus is also poor and epiglottis does not deflect for airway protection. Thin liquids pose a severe risk of aspiration and nectar thick liquids reduce severity of aspiration events, but are still likely to be aspirated due to increased residuals with thick viscosity. A chin tuck significantly improves bolus transit if fully tucked against chest. Would suspect that carry over for this will be poor, but will trial dys 3 solids and nectar thick liquids with full supervision for a chin tuck to determine if it is achievable.  Impact on safety and function Severe aspiration risk   CHL IP TREATMENT RECOMMENDATION 11/30/2016 Treatment Recommendations Therapy as outlined in treatment plan below   Prognosis 11/30/2016 Prognosis for Safe Diet Advancement Guarded Barriers to Reach Goals Severity of deficits;Time post onset Barriers/Prognosis Comment -- CHL IP DIET RECOMMENDATION 11/30/2016 SLP Diet Recommendations Dysphagia 3 (Mech soft) solids;Nectar thick liquid Liquid Administration via Cup;Straw Medication Administration Whole meds with puree Compensations Chin tuck;Slow rate;Small sips/bites;Minimize environmental distractions  Postural Changes Remain semi-upright after after feeds/meals (Comment);Seated upright at 90 degrees   CHL IP OTHER RECOMMENDATIONS 11/30/2016 Recommended Consults -- Oral Care Recommendations Oral care BID Other Recommendations Order thickener from pharmacy   CHL IP FOLLOW UP RECOMMENDATIONS 11/30/2016 Follow up  Recommendations Skilled Nursing facility   [facility] IP FREQUENCY AND DURATION 11/30/2016 Speech Therapy Frequency (ACUTE ONLY) min 2x/week Treatment Duration 2 weeks      CHL IP ORAL PHASE 11/30/2016 Oral Phase WFL Oral - Pudding Teaspoon -- Oral - Pudding Cup -- Oral - Honey Teaspoon -- Oral - Honey Cup -- Oral - Nectar Teaspoon -- Oral - Nectar Cup -- Oral - Nectar Straw -- Oral - Thin Teaspoon -- Oral - Thin Cup -- Oral - Thin Straw -- Oral - Puree -- Oral - Mech Soft -- Oral - Regular -- Oral - Multi-Consistency -- Oral - Pill -- Oral Phase - Comment --  CHL IP PHARYNGEAL PHASE 11/30/2016 Pharyngeal Phase Impaired Pharyngeal- Pudding Teaspoon -- Pharyngeal -- Pharyngeal- Pudding Cup -- Pharyngeal -- Pharyngeal- Honey Teaspoon -- Pharyngeal -- Pharyngeal- Honey Cup -- Pharyngeal -- Pharyngeal- Nectar Teaspoon -- Pharyngeal -- Pharyngeal- Nectar Cup Reduced epiglottic inversion;Reduced anterior laryngeal mobility;Reduced airway/laryngeal closure;Reduced tongue base retraction;Delayed swallow initiation-pyriform sinuses;Pharyngeal residue - valleculae;Pharyngeal residue - pyriform;Compensatory strategies attempted (with notebox) Pharyngeal -- Pharyngeal- Nectar Straw Reduced epiglottic inversion;Reduced anterior laryngeal mobility;Reduced airway/laryngeal closure;Reduced tongue base retraction;Delayed swallow initiation-pyriform sinuses;Pharyngeal residue - valleculae;Pharyngeal residue - pyriform;Compensatory strategies attempted (with notebox);Other (Comment) Pharyngeal -- Pharyngeal- Thin Teaspoon -- Pharyngeal -- Pharyngeal- Thin Cup Reduced epiglottic inversion;Reduced anterior laryngeal mobility;Reduced  airway/laryngeal closure;Reduced tongue base retraction;Delayed swallow initiation-pyriform sinuses;Pharyngeal residue - valleculae;Pharyngeal residue - pyriform;Penetration/Apiration after swallow;Significant aspiration (Amount) Pharyngeal Material enters airway, passes BELOW cords and not ejected out despite cough attempt by patient Pharyngeal- Thin Straw Reduced epiglottic inversion;Reduced anterior laryngeal mobility;Reduced airway/laryngeal closure;Reduced tongue base retraction;Delayed swallow initiation-pyriform sinuses;Pharyngeal residue - valleculae;Pharyngeal residue - pyriform;Compensatory strategies attempted (with notebox);Penetration/Aspiration during swallow Pharyngeal Material enters airway, passes BELOW cords without attempt by patient to eject out (silent aspiration) Pharyngeal- Puree Reduced epiglottic inversion;Reduced anterior laryngeal mobility;Reduced airway/laryngeal closure;Reduced tongue base retraction;Pharyngeal residue - valleculae;Pharyngeal residue - pyriform;Compensatory strategies attempted (with notebox);Delayed swallow initiation-vallecula Pharyngeal -- Pharyngeal- Mechanical Soft Reduced epiglottic inversion;Reduced anterior laryngeal mobility;Reduced airway/laryngeal closure;Reduced tongue base retraction;Pharyngeal residue - valleculae;Pharyngeal residue - pyriform;Compensatory strategies attempted (with notebox);Delayed swallow initiation-vallecula Pharyngeal -- Pharyngeal- Regular -- Pharyngeal -- Pharyngeal- Multi-consistency -- Pharyngeal -- Pharyngeal- Pill Reduced epiglottic inversion;Reduced anterior laryngeal mobility;Reduced airway/laryngeal closure;Reduced tongue base retraction;Pharyngeal residue - valleculae;Pharyngeal residue - pyriform;Compensatory strategies attempted (with notebox);Delayed swallow initiation-vallecula Pharyngeal -- Pharyngeal Comment --  No flowsheet data found. CHL IP GO 11/29/2016 Functional Assessment Tool Used clinical judgement Functional  Limitations Swallowing Swallow Current Status [phone]) CJ Swallow Goal Status (C3013) CJ Swallow Discharge Status (H4388) (None) Motor Speech Current Status [phone]) (None) Motor Speech Goal Status (J2820) (None) Motor Speech Goal Status (U0156) (None) Spoken Language Comprehension Current Status (F5379) (None) Spoken Language Comprehension Goal Status (K3276) (None) Spoken Language Comprehension Discharge Status [phone]) (None) Spoken Language Expression Current Status [phone]) (None) Spoken Language Expression Goal Status [phone]) (None) Spoken Language Expression Discharge Status [phone]) (None) Attention Current Status (R8381) (None) Attention Goal Status (M4037) (None) Attention Discharge Status [phone]) (None) Memory Current Status (O7703) (None) Memory Goal Status (E0352) (None) Memory Discharge Status (Y8185) (None) Voice Current Status (T0931) (None) Voice Goal Status (P2162) (None) Voice Discharge Status (O4695) (None) Other Speech-Language Pathology Functional Limitation [phone]) (None) Other Speech-Language Pathology Functional Limitation Goal Status (J5051) (None) Other Speech-Language Pathology Functional Limitation Discharge Status [phone]) (None) Harlon Ditty, MA CCC-SLP [phone] [place], Riley Nearing 11/30/2016, 2:50 PM              Dg Femur Min 2 Views Left  Result Date: 11/27/2016 CLINICAL DATA:  Larey Seat while attempting to get out of bed unassisted today. EXAM: LEFT FEMUR 2 VIEWS COMPARISON:  None. FINDINGS: There is a subcapital left hip fracture. Remainder of the femur is intact. No dislocation. No bone lesion or bony destruction. IMPRESSION: Subcapital left hip fracture Electronically Signed   By: Ellery Plunk M.D.   On: 11/27/2016 01:13     LOS: 4 days   Jeoffrey Massed, MD  [facility] Pager:[phone]  If 7PM-7AM, please contact night-coverage [email or web address] Password [email or web address] 12/01/2016, 11:28 AM

## 2016-12-01 NOTE — Consult Note (Signed)
Consultation Note Date: 12/01/2016   Patient Name: Curtis Keller  DOB: 1956/09/10  MRN: [id number]  Age / Sex: 61 y.o., male  PCP: Gildardo Cranker, DO Referring Physician: Jonetta Osgood, MD  Reason for Consultation: Establishing goals of care  HPI/Patient Profile: 61 y.o. male  with past medical history of HTN, systolic CHF EF 15-05%, CAD, DM type II, AAA, COPD, chronic respiratory failure on 2 L of [place] oxygen at night, CVA with residual left-sided hemiparesis/dysphagia, apical thrombus on Coumadin, splenectomy s/p fall with complications resulting in tracheostomy x 6 weeks admitted on 11/26/2016 with left hip fracture s/p fall at SNF. Awaiting hip repair d/t concern for respiratory status aspiration pneumonia and heart failure exacerbation. High risk for post-op delirium as well as he is already exhibiting signs of delirium.   Clinical Assessment and Goals of Care: I met today at Mr. Rebstock' bedside along with his sister, Curtis Keller. Mr. Dougal is sleeping (had morphine a few hours ago) and does not participate in conversation. He is also confused when awake.   Curtis Keller reviews his history with me and says that his problems began with stroke in Feb 2017 and that resulted in multiple complications from transition to SNF rehab with bad fall requiring splenectomy and then followed by respiratory issues (she believes that he may have aspirated oral contrast) and tracheostomy x ~6 weeks and ultimately successfully removed. She says that he has actually been quite well and what she would consider good QOL since June 2017 until this admission. She says that he is not very ambulatory but knows where he is and even most of the staff at [facility]. He has some short term memory loss but Curtis Keller says he is typically very aware and able to converse.   When discussing his events of this hospitalization she is aware of concern for  aspiration and continued decline. However she remains hopeful that he will stabilize, has his hip repaired, and return to SNF for rehab. When discussing his overall health issues and risk for further decline she tells me that she has considered DNR but fears that he will not be fully treated otherwise (she has witnessed this as a Marine scientist) and may be looked over. She is open to considering options with further decline and open to continuing conversations. She is hopeful for improvement. Curtis Keller is very appreciative of the care her brother has received from all providers and nursing.   Primary Decision Maker NEXT OF KIN He has a daughter that has not been involved and has not seen him in a few years per [place]. Curtis Keller has been his main caregiver but is not his documented HCPOA.     SUMMARY OF RECOMMENDATIONS   - Watchful waiting - Family hopeful for improvement - Will continue palliative discussions  Code Status/Advance Care Planning:  Full code   Symptom Management:   Pain: Continue percocet and morphine prn. I recommended scheduled acetaminophen to get some pain relief other than sedating meds but Curtis Keller was cautious about liver disease (I do not  see any h/o liver disease and labs/LFTs seem fine) but agreed not to add at this time.   Palliative Prophylaxis:   Aspiration, Bowel Regimen, Delirium Protocol, Frequent Pain Assessment, Oral Care and Turn Reposition  Additional Recommendations (Limitations, Scope, Preferences):  Full Scope Treatment  Psycho-social/Spiritual:   Desire for further Chaplaincy support:no  Additional Recommendations: Caregiving  Support/Resources  Prognosis:   Unable to determine  Discharge Planning: To Be Determined      Primary Diagnoses: Present on Admission: . Closed left hip fracture ([place]) . Hypertension . Hyperlipidemia . Heart failure, chronic systolic ([place]) . Fall at nursing home . COPD (chronic obstructive pulmonary disease) ([place]) .  Protein calorie malnutrition ([place]) . GERD (gastroesophageal reflux disease) . Aspiration pneumonia ([place])   I have reviewed the medical record, interviewed the patient and family, and examined the patient. The following aspects are pertinent.  Past Medical History:  Diagnosis Date  . Acute respiratory failure ([place])    a. Spring 2017 following fall/splenectomy -->admitted to [facility]-->trach/g tube.  . Anemia    a. 12/2015 ABL in setting of fall/hematomas/splenic laceration req splenectomy.  Marland Kitchen Apical mural thrombus    a. 11/2015 Echo: EF 25-30%, moderate size apical thrombus-->coumadin;  b. 12/2015 f/u Echo: EF 25-30%, ant/septa HK, no obvious large thrombus but cannot exclude small mural thrombus.  Marland Kitchen CAD (coronary artery disease)    a. 12/2014 s/p BMS to the RCA.  Marland Kitchen Cerebral infarction ([place])   . Chronic systolic CHF (congestive heart failure) ([place])    a. 12/2015 Echo: EF 25-30%.  . Dementia   . Depression   . Dysphagia   . Falls    a. 11/2015 traumatic fall with resultant trauma req splenectomy and prolonged hospitalization complicated by resp failure.  Marland Kitchen History of pneumonia   . Hyperlipidemia   . Hypertension   . Ischemic cardiomyopathy    a. 12/2015 Echo: EF 25-30%, anterior and septal HK.  Marland Kitchen Left hemiplegia ([place])   . Protein calorie malnutrition ([place])   . Respiratory failure ([place])   . Spleen injury   . Status post tracheostomy ([place])    a. 01/2016 in setting of ongoing resp failure and aspiration.   Social History   Social History  . Marital status: Single    Spouse name: N/A  . Number of children: N/A  . Years of education: N/A   Social History Main Topics  . Smoking status: Former Smoker    Packs/day: 2.00    Years: 38.00    Types: Cigarettes  . Smokeless tobacco: Never Used     Comment: quit spring of 2017.  Marland Kitchen Alcohol use No  . Drug use: No  . Sexual activity: Not Asked   Other Topics Concern  . None   Social History Narrative   Lives @ SNF since  Spring 2017.  Sedentary.  Unsteady on his feet.  Falls about 1x/wk.   Family History  Problem Relation Age of Onset  . Alcohol abuse Mother   . Hypertension Mother   . Coronary artery disease Mother   . Alcohol abuse Father   . Hypertension Father   . Coronary artery disease Father   . Alcohol abuse Maternal Uncle    Scheduled Meds: . aspirin EC  81 mg Oral Daily  . atorvastatin  40 mg Oral QHS  . busPIRone  10 mg Oral BID  . Chlorhexidine Gluconate Cloth  6 each Topical Q0600  . divalproex  125 mg Oral TID  . famotidine  20  mg Oral BID  . feeding supplement (ENSURE ENLIVE)  237 mL Oral BID BM  . FLUoxetine  60 mg Oral Daily  . furosemide  80 mg Intravenous Once  . heparin subcutaneous  5,000 Units Subcutaneous Q8H  . hydrALAZINE  12.5 mg Oral q12n4p  . insulin aspart  0-9 Units Subcutaneous TID WC  . ipratropium-albuterol  3 mL Nebulization Q6H  . mouth rinse  15 mL Mouth Rinse BID  . metoprolol succinate  25 mg Oral Daily  . mupirocin ointment  1 application Nasal BID  . nicotine  21 mg Transdermal Daily  . piperacillin-tazobactam (ZOSYN)  IV  3.375 g Intravenous Q8H  . polyethylene glycol  17 g Oral Daily  . predniSONE  40 mg Oral Q breakfast  . pregabalin  75 mg Oral Daily  . senna-docusate  1 tablet Oral BID  . vancomycin  750 mg Intravenous Q12H   Continuous Infusions: PRN Meds:.acetaminophen, haloperidol lactate, methocarbamol, morphine injection, oxyCODONE-acetaminophen, RESOURCE THICKENUP CLEAR Allergies  Allergen Reactions  . Codeine Anaphylaxis and Nausea And Vomiting  . Hydrocodone Other (See Comments)    On MAR  . Hydrocodone-Acetaminophen Nausea And Vomiting  . Tegaderm Ag Mesh [Silver] Other (See Comments)    On MAR  . Lisinopril Itching and Rash  . Tylenol [Acetaminophen] Nausea Only   Review of Systems  Unable to perform ROS: Dementia    Physical Exam  Constitutional: He appears well-developed. He appears lethargic.  HENT:  Head: Normocephalic  and atraumatic.  Cardiovascular: Normal rate and regular rhythm.   Pulmonary/Chest: Effort normal. No accessory muscle usage. No tachypnea. No respiratory distress. He has decreased breath sounds. He has rales in the right lower field and the left lower field.  Abdominal: Normal appearance.  Neurological: He appears lethargic. He is disoriented.  Nursing note and vitals reviewed.   Vital Signs: BP 102/73 (BP Location: Left Arm)   Pulse 72   Temp 98.1 F (36.7 C) (Axillary)   Resp 13   Ht '6\' 2"'$  (1.88 m)   Wt 115.1 kg (253 lb 12 oz)   SpO2 91%   BMI 32.58 kg/m  Pain Assessment: Faces POSS *See Group Information*: S-Acceptable,Sleep, easy to arouse Pain Score: 0-No pain   SpO2: SpO2: 91 % O2 Device:SpO2: 91 % O2 Flow Rate: .O2 Flow Rate (L/min): 3 L/min  IO: Intake/output summary:  Intake/Output Summary (Last 24 hours) at 12/01/16 1004 Last data filed at 11/30/16 1500  Gross per 24 hour  Intake                0 ml  Output              400 ml  Net             -400 ml    LBM: Last BM Date: 11/24/16 Baseline Weight: Weight: 102.1 kg (225 lb) Most recent weight: Weight: 115.1 kg (253 lb 12 oz)     Palliative Assessment/Data:   Flowsheet Rows   Flowsheet Row Most Recent Value  Intake Tab  Referral Department  Hospitalist  Unit at Time of Referral  ICU  Palliative Care Primary Diagnosis  Cardiac  Date Notified  11/30/16  Palliative Care Type  New Palliative care  Reason for referral  Clarify Goals of Care  Date of Admission  11/26/16  Date first seen by Palliative Care  11/30/16  # of days Palliative referral response time  0 Day(s)  # of days IP prior to Palliative referral  4  Clinical Assessment  Psychosocial & Spiritual Assessment  Palliative Care Outcomes      Time In: 0900 Time Out: 1010 Time Total: 45mn Greater than 50%  of this time was spent counseling and coordinating care related to the above assessment and plan.  Signed by: AVinie Sill  NP Palliative Medicine Team Pager # [phone](M-F 8a-5p) Team Phone # [phone](Nights/Weekends)

## 2016-12-01 NOTE — Progress Notes (Addendum)
Progress Note  Patient Name: Curtis Keller Date of Encounter: 12/01/2016  Primary Cardiologist: Dr. Desma Maxim [facility])  Subjective    Feels better today  Inpatient Medications    Scheduled Meds: . aspirin EC  81 mg Oral Daily  . atorvastatin  40 mg Oral QHS  . busPIRone  10 mg Oral BID  . Chlorhexidine Gluconate Cloth  6 each Topical Q0600  . divalproex  125 mg Oral TID  . famotidine  20 mg Oral BID  . FLUoxetine  60 mg Oral Daily  . furosemide  80 mg Intravenous Once  . heparin subcutaneous  5,000 Units Subcutaneous Q8H  . hydrALAZINE  12.5 mg Oral q12n4p  . insulin aspart  0-9 Units Subcutaneous TID WC  . ipratropium-albuterol  3 mL Nebulization Q6H  . mouth rinse  15 mL Mouth Rinse BID  . metoprolol succinate  25 mg Oral Daily  . mupirocin ointment  1 application Nasal BID  . nicotine  21 mg Transdermal Daily  . piperacillin-tazobactam (ZOSYN)  IV  3.375 g Intravenous Q8H  . polyethylene glycol  17 g Oral Daily  . predniSONE  40 mg Oral Q breakfast  . pregabalin  75 mg Oral Daily  . senna-docusate  1 tablet Oral BID  . vancomycin  750 mg Intravenous Q12H   Continuous Infusions:  PRN Meds: acetaminophen, haloperidol lactate, methocarbamol, morphine injection, oxyCODONE-acetaminophen, RESOURCE THICKENUP CLEAR   Vital Signs    Vitals:   12/01/16 0249 12/01/16 0411 12/01/16 0806 12/01/16 0900  BP:  118/81 102/73   Pulse:   72   Resp:  (!) 22 13   Temp:  99.2 F (37.3 C) 98.1 F (36.7 C)   TempSrc:  Oral Axillary   SpO2: 95%  95% 91%  Weight:      Height:        Intake/Output Summary (Last 24 hours) at 12/01/16 0913 Last data filed at 11/30/16 1500  Gross per 24 hour  Intake                0 ml  Output              400 ml  Net             -400 ml   I/O since admission: -1920  Filed Weights   11/26/16 2359 11/29/16 0352 11/30/16 0500  Weight: 225 lb (102.1 kg) 253 lb 8.5 oz (115 kg) 253 lb 12 oz (115.1 kg)    Telemetry    SR in the 70s  with PVCs - Personally Reviewed  ECG    ECG (independently read by me): NSR at 66, ILBBB, QS v1-2; small Q's inferiorly  Physical Exam   BP 102/73 (BP Location: Left Arm)   Pulse 72   Temp 98.1 F (36.7 C) (Axillary)   Resp 13   Ht 6\' 2"  (1.88 m)   Wt 253 lb 12 oz (115.1 kg)   SpO2 91%   BMI 32.58 kg/m    GEN:  No acute distress.   Skin: mild rash Neck: No JVD Cardiac: RRR with rare ectopic beat, faint 1/6 SEM; no s3, no rub Respiratory:  slightly decreased; no wheezing GI: Soft, nontender, non-distended  MS: No edema; No deformity. Left leg flexed, DP slightly reduced. +1 on left. Neuro:  Nonfocal  Psych: Normal affect   Labs    Chemistry  Recent Labs Lab 11/27/16 0000  11/28/16 9562 11/29/16 0357 11/30/16 0523 12/01/16 0224  NA 138  < >  138 136 139 141  K 4.3  < > 4.5 4.1 3.9 4.0  CL 102  < > 104 101 101 101  CO2 24  < > 22 23 24 27   GLUCOSE 137*  < > 118* 212* 193* 139*  BUN 23*  < > 26* 32* 43* 49*  CREATININE 1.97*  < > 1.58* 1.82* 1.83* 1.57*  CALCIUM 9.0  < > 8.7* 9.0 9.2 9.4  PROT 7.1  --  7.0 7.1  --   --   ALBUMIN 3.1*  --  2.6* 2.4*  --   --   AST 32  --  22 27  --   --   ALT 29  --  18 17  --   --   ALKPHOS 82  --  70 61  --   --   BILITOT 0.7  --  0.5 0.9  --   --   GFRNONAA 35*  < > 46* 38* 38* 46*  GFRAA 40*  < > 53* 45* 44* 53*  ANIONGAP 12  < > 12 12 14 13   < > = values in this interval not displayed.   Hematology  Recent Labs Lab 11/29/16 0357 11/30/16 0523 12/01/16 0224  WBC 14.0* 23.4* 18.0*  RBC 4.42 4.15* 4.23  HGB 14.6 13.5 14.0  HCT 43.3 40.0 40.4  MCV 98.0 96.4 95.5  MCH 33.0 32.5 33.1  MCHC 33.7 33.8 34.7  RDW 14.7 14.7 14.9  PLT 152 169 178    Cardiac Enzymes  Recent Labs Lab 11/27/16 0000 11/27/16 0346 11/27/16 1557 11/27/16 2022  TROPONINI 0.03* 0.04* 0.03* 0.05*   No results for input(s): TROPIPOC in the last 168 hours.   BNP  Recent Labs Lab 11/27/16 0000 11/28/16 1431  BNP 311.3* 355.6*      DDimer No results for input(s): DDIMER in the last 168 hours.   Radiology    Dg Chest Port 1 View  Addendum Date: 12/01/2016   ADDENDUM REPORT: 12/01/2016 08:08 ADDENDUM: There is an old healed right clavicle fracture, stable. Electronically Signed   By: Bretta Bang III M.D.   On: 12/01/2016 08:08   Result Date: 12/01/2016 CLINICAL DATA:  Shortness of Breath EXAM: PORTABLE CHEST 1 VIEW COMPARISON:  November 30, 2016 FINDINGS: Extensive airspace consolidation bilaterally, most severe in the left upper to mid lung zones, up remains without significant change. There is cardiomegaly with pulmonary venous hypertension. No adenopathy evident. No bone lesions. IMPRESSION: Widespread airspace consolidation bilaterally, somewhat more on the left than on the right. Evidence a degree of underlying pulmonary vascular congestion. Question multifocal pneumonia versus pulmonary edema; both entities may well exist concurrently. The overall appearance of the lungs and cardiomediastinal silhouette is stable compared to 1 day prior. Electronically Signed: By: Bretta Bang III M.D. On: 12/01/2016 08:02   Dg Chest Port 1v Same Day  Result Date: 11/30/2016 CLINICAL DATA:  Shortness of breath, cough today, pneumonia, history hypertension, prior respiratory failure, coronary disease, chronic systolic CHF, ischemic cardiomyopathy EXAM: PORTABLE CHEST 1 VIEW COMPARISON:  Portable exam 0922 hours compared to 11/28/2016 FINDINGS: Rotated to the LEFT with kyphotic positioning. Enlargement of cardiac silhouette. Stable mediastinal contours for degree of rotation. Extensive BILATERAL pulmonary infiltrates favor pneumonia over edema. No pleural effusion or pneumothorax. Bones unremarkable. IMPRESSION: Persistent BILATERAL pulmonary infiltrates question pneumonia. Enlargement of cardiac silhouette. Electronically Signed   By: Ulyses Southward M.D.   On: 11/30/2016 09:31   Dg Swallowing Func-speech Pathology  Result Date:  11/30/2016  Objective Swallowing Evaluation: Type of Study: MBS-Modified Barium Swallow Study Patient Details Name: Curtis Keller MRN: [id number] Date of Birth: Mar 11, 1956 Today's Date: 11/30/2016 Time: SLP Start Time (ACUTE ONLY): 1310-SLP Stop Time (ACUTE ONLY): 1325 SLP Time Calculation (min) (ACUTE ONLY): 15 min Past Medical History: Past Medical History: Diagnosis Date . Acute respiratory failure (HCC)   a. Spring 2017 following fall/splenectomy -->admitted to [facility]-->trach/g tube. . Anemia   a. 12/2015 ABL in setting of fall/hematomas/splenic laceration req splenectomy. Marland Kitchen Apical mural thrombus   a. 11/2015 Echo: EF 25-30%, moderate size apical thrombus-->coumadin;  b. 12/2015 f/u Echo: EF 25-30%, ant/septa HK, no obvious large thrombus but cannot exclude small mural thrombus. Marland Kitchen CAD (coronary artery disease)   a. 12/2014 s/p BMS to the RCA. Marland Kitchen Cerebral infarction (HCC)  . Chronic systolic CHF (congestive heart failure) (HCC)   a. 12/2015 Echo: EF 25-30%. . Dementia  . Depression  . Dysphagia  . Falls   a. 11/2015 traumatic fall with resultant trauma req splenectomy and prolonged hospitalization complicated by resp failure. Marland Kitchen History of pneumonia  . Hyperlipidemia  . Hypertension  . Ischemic cardiomyopathy   a. 12/2015 Echo: EF 25-30%, anterior and septal HK. Marland Kitchen Left hemiplegia (HCC)  . Protein calorie malnutrition (HCC)  . Respiratory failure (HCC)  . Spleen injury  . Status post tracheostomy (HCC)   a. 01/2016 in setting of ongoing resp failure and aspiration. Past Surgical History: Past Surgical History: Procedure Laterality Date . CARDIAC CATHETERIZATION   . CORONARY ANGIOPLASTY WITH STENT PLACEMENT   . IR GENERIC HISTORICAL  07/22/2016  IR GASTROSTOMY TUBE REMOVAL 07/22/2016 [name], MD [facility]-INTERV RAD . PR LAP, SPLENECTOMY  01/07/2016 . TRACHEOSTOMY TUBE PLACEMENT N/A 02/22/2016  Procedure: TRACHEOSTOMY;  Surgeon: [name], MD;  Location: [facility] OR;  Service: ENT;  Laterality: N/A; HPI: Pt. with PMH  of HTN, chronic systolic CHF, chronic respiratory failure on 3L of oxygen, diabetes mellitus, recurrent fall, chronic anticoagulation, dyslipidemia; admitted on 11/26/2016, with complaint of A mechanical fall, was found to have closed left hip fracture.Patient developed aspiration pneumonia in the hospital. Currently surgery is on hold until pneumonia is better. Per MD pt was on dys 2/nectar thick liquids at SNF, but has been on thin liquids here. Pt had an MBS during admission to [facility] in 2017, but record not available. Pt also had a trach at that time. Reproted has a history of dysphagia due to prior CVAs.  No Data Recorded Assessment / Plan / Recommendation CHL IP CLINICAL IMPRESSIONS 11/30/2016 Therapy Diagnosis Severe pharyngeal phase dysphagia Clinical Impression Pt demonstrates a severe oropharygneal dysphagia with decreased duration and magnitude of hyolaryngeal excursion leading to severe oropharyngeal residuals that are aspirated with and without sensation in further swallow attempts. Base of tongue propulsion of bolus is also poor and epiglottis does not deflect for airway protection. Thin liquids pose a severe risk of aspiration and nectar thick liquids reduce severity of aspiration events, but are still likely to be aspirated due to increased residuals with thick viscosity. A chin tuck significantly improves bolus transit if fully tucked against chest. Would suspect that carry over for this will be poor, but will trial dys 3 solids and nectar thick liquids with full supervision for a chin tuck to determine if it is achievable.  Impact on safety and function Severe aspiration risk   CHL IP TREATMENT RECOMMENDATION 11/30/2016 Treatment Recommendations Therapy as outlined in treatment plan below   Prognosis 11/30/2016 Prognosis for Safe Diet  Advancement Guarded Barriers to Reach Goals Severity of deficits;Time post onset Barriers/Prognosis Comment -- CHL IP DIET RECOMMENDATION 11/30/2016 SLP  Diet Recommendations Dysphagia 3 (Mech soft) solids;Nectar thick liquid Liquid Administration via Cup;Straw Medication Administration Whole meds with puree Compensations Chin tuck;Slow rate;Small sips/bites;Minimize environmental distractions Postural Changes Remain semi-upright after after feeds/meals (Comment);Seated upright at 90 degrees   CHL IP OTHER RECOMMENDATIONS 11/30/2016 Recommended Consults -- Oral Care Recommendations Oral care BID Other Recommendations Order thickener from pharmacy   CHL IP FOLLOW UP RECOMMENDATIONS 11/30/2016 Follow up Recommendations Skilled Nursing facility   [facility] IP FREQUENCY AND DURATION 11/30/2016 Speech Therapy Frequency (ACUTE ONLY) min 2x/week Treatment Duration 2 weeks      CHL IP ORAL PHASE 11/30/2016 Oral Phase WFL Oral - Pudding Teaspoon -- Oral - Pudding Cup -- Oral - Honey Teaspoon -- Oral - Honey Cup -- Oral - Nectar Teaspoon -- Oral - Nectar Cup -- Oral - Nectar Straw -- Oral - Thin Teaspoon -- Oral - Thin Cup -- Oral - Thin Straw -- Oral - Puree -- Oral - Mech Soft -- Oral - Regular -- Oral - Multi-Consistency -- Oral - Pill -- Oral Phase - Comment --  CHL IP PHARYNGEAL PHASE 11/30/2016 Pharyngeal Phase Impaired Pharyngeal- Pudding Teaspoon -- Pharyngeal -- Pharyngeal- Pudding Cup -- Pharyngeal -- Pharyngeal- Honey Teaspoon -- Pharyngeal -- Pharyngeal- Honey Cup -- Pharyngeal -- Pharyngeal- Nectar Teaspoon -- Pharyngeal -- Pharyngeal- Nectar Cup Reduced epiglottic inversion;Reduced anterior laryngeal mobility;Reduced airway/laryngeal closure;Reduced tongue base retraction;Delayed swallow initiation-pyriform sinuses;Pharyngeal residue - valleculae;Pharyngeal residue - pyriform;Compensatory strategies attempted (with notebox) Pharyngeal -- Pharyngeal- Nectar Straw Reduced epiglottic inversion;Reduced anterior laryngeal mobility;Reduced airway/laryngeal closure;Reduced tongue base retraction;Delayed swallow initiation-pyriform sinuses;Pharyngeal residue - valleculae;Pharyngeal  residue - pyriform;Compensatory strategies attempted (with notebox);Other (Comment) Pharyngeal -- Pharyngeal- Thin Teaspoon -- Pharyngeal -- Pharyngeal- Thin Cup Reduced epiglottic inversion;Reduced anterior laryngeal mobility;Reduced airway/laryngeal closure;Reduced tongue base retraction;Delayed swallow initiation-pyriform sinuses;Pharyngeal residue - valleculae;Pharyngeal residue - pyriform;Penetration/Apiration after swallow;Significant aspiration (Amount) Pharyngeal Material enters airway, passes BELOW cords and not ejected out despite cough attempt by patient Pharyngeal- Thin Straw Reduced epiglottic inversion;Reduced anterior laryngeal mobility;Reduced airway/laryngeal closure;Reduced tongue base retraction;Delayed swallow initiation-pyriform sinuses;Pharyngeal residue - valleculae;Pharyngeal residue - pyriform;Compensatory strategies attempted (with notebox);Penetration/Aspiration during swallow Pharyngeal Material enters airway, passes BELOW cords without attempt by patient to eject out (silent aspiration) Pharyngeal- Puree Reduced epiglottic inversion;Reduced anterior laryngeal mobility;Reduced airway/laryngeal closure;Reduced tongue base retraction;Pharyngeal residue - valleculae;Pharyngeal residue - pyriform;Compensatory strategies attempted (with notebox);Delayed swallow initiation-vallecula Pharyngeal -- Pharyngeal- Mechanical Soft Reduced epiglottic inversion;Reduced anterior laryngeal mobility;Reduced airway/laryngeal closure;Reduced tongue base retraction;Pharyngeal residue - valleculae;Pharyngeal residue - pyriform;Compensatory strategies attempted (with notebox);Delayed swallow initiation-vallecula Pharyngeal -- Pharyngeal- Regular -- Pharyngeal -- Pharyngeal- Multi-consistency -- Pharyngeal -- Pharyngeal- Pill Reduced epiglottic inversion;Reduced anterior laryngeal mobility;Reduced airway/laryngeal closure;Reduced tongue base retraction;Pharyngeal residue - valleculae;Pharyngeal residue -  pyriform;Compensatory strategies attempted (with notebox);Delayed swallow initiation-vallecula Pharyngeal -- Pharyngeal Comment --  No flowsheet data found. CHL IP GO 11/29/2016 Functional Assessment Tool Used clinical judgement Functional Limitations Swallowing Swallow Current Status [phone]) CJ Swallow Goal Status (U0454) CJ Swallow Discharge Status (U9811) (None) Motor Speech Current Status [phone]) (None) Motor Speech Goal Status (G9562) (None) Motor Speech Goal Status (Z3086) (None) Spoken Language Comprehension Current Status (V7846) (None) Spoken Language Comprehension Goal Status (N6295) (None) Spoken Language Comprehension Discharge Status [phone]) (None) Spoken Language Expression Current Status [phone]) (None) Spoken Language Expression Goal Status (U2725) (None) Spoken Language Expression Discharge Status [phone]) (None) Attention Current Status (I3474) (None) Attention Goal Status (Q5956) (None) Attention Discharge Status (L8756) (None)  Memory Current Status [phone]) (None) Memory Goal Status [phone]) (None) Memory Discharge Status [phone]) (None) Voice Current Status (Q2060) (None) Voice Goal Status (R5615) (None) Voice Discharge Status [phone]) (None) Other Speech-Language Pathology Functional Limitation [phone]) (None) Other Speech-Language Pathology Functional Limitation Goal Status (J0929) (None) Other Speech-Language Pathology Functional Limitation Discharge Status [phone]) (None) Harlon Ditty, MA CCC-SLP [phone] [place], Riley Nearing 11/30/2016, 2:50 PM               Cardiac Studies   TTE: 11/28/16  Study Conclusions  - Left ventricle: The cavity size was normal. Systolic function was   severely reduced. The estimated ejection fraction was in the   range of 25% to 30%. Wall motion was normal; there were no   regional wall motion abnormalities. Doppler parameters are   consistent with abnormal left ventricular relaxation (grade 1   diastolic dysfunction). There was no evidence of elevated    ventricular filling pressure by Doppler parameters. - Mitral valve: There was trivial regurgitation. - Left atrium: The atrium was mildly dilated. - Right ventricle: Systolic function was normal. - Pulmonic valve: Not visualized. There was no regurgitation. - Inferior vena cava: The vessel was normal in size.  Impressions:  - Very limited image quality, however LVEF is severely impaired,   estimated at 25-30% with akinesis of all of the apical segments   and mid anteroseptal, anterior and anterolateral segments.   There is no thrombus on the contrast images.   A cardiac MRI is recommended for further LVEF evaluation.  Patient Profile     61 y.o. male h/o CAD s/p BMS to the RCA, ICM/systolic CHF, prior CVA, HTN, HL, freq fall, tob abuse, LV thrombus on coumadin, and dementia, who was admitted 2/3 from his SNF with left leg pain after a fall from bed resulting in left femoral neck fx. Cardiology was asked to provider preop eval.   Assessment & Plan    1.  L Hip Fx/Preoperative Cardiac Evaluation:  Pt with a h/o CAD s/p BMS of the RCA in 12/2014 and ICM/syst CHF with EF of 25-30% by echo in 12/2015.  He was admitted 2/4 after falling out of bed late on 2/3.  Noted to have subcapital L hip fx.  From a cardiac perspective, he reports no recent c/p, dyspnea, pnd, orthopnea, edema, early satiety, presyncope, or syncope.  Moderate risk for cardiac complications for perioperative hip surgery.  In the absence of Ss, no further ischemic evaluation. Continue ? blocker therapy throughout the perioperative period. Obtain ECG post-op.  2.  CAD:  S/p prior BMS  RCA in 12/2014.  No recent anginal symptoms. Continue ? blocker and statin. Will ultimately try to titrate Topral slightly as BP allows.  3.  LV Thrombus:  Noted on echo 11/2015.  Not well appreciated on 12/2015 echo.  He has been on coumadin for at least a year based on records.  11/28/16 Echo  with EF 25 - 30% with apical and anterior, anteroseptal  and anterolateral akinesis. No thrombus was noted but he had been on warfarin anticoagulation. According to family member pt had had MI initially in 1999, and has had several additional events.  Echo wall motion abnormality suggests LAD infarct. Although no thrombus presently, the substrate remains for recurrent thrombus with apical akinesis. With recurrent fall risk he may not be a candidate for continued systemic anticoagulation. Now on ASA and sq heparin.   4. Ischemic cardiomyopathy:  EF 25 - 30%. F/U BNP 11/28/16 at 355  I/O -  1520 since admission.  5. Aspiration Pneumonia: now on Vanc/Zosyn  CXR 2/5 demonstrated progressive pneumonia changes vs CHF with bilateral infiltrates.  WBC increased  23.4 yesterday improved to 18.0 today may also be contributed by steroids. Swallowing study shows severe oropharygneal dysphagia with decreased duration and magnitude of hyolaryngeal excursion leading to severe oropharyngeal residuals that are aspirated.  6. Essential HTN: BP stable today at 122/89; if BP allows with LV dysfunction may ultimately benefit from low dose hydralazine; started yesterday at 12.5 mg bid.  BP today 102/73; will continue at present dose today.  7. HLD:  Continue statin   8. CKD Stage 3: Cr today 1.83; GFR 38  Signed, Nicki Guadalajara, MD, [facility] 12/01/2016, 9:13 AM

## 2016-12-01 NOTE — Progress Notes (Signed)
Pharmacy Antibiotic Note  Curtis Keller is a 61 y.o. male on vancomycin and zosyn for PNA. He is on day  4 of therapy.  -WBC= 18 (possibly steroid related), afebrile, SCr= 1.57 and CrCl ~ 65 -cultures- ngtd  Discussed with Dr. Jerral Ralph: discontinue vancomycin. Continue zosyn for a total of 7 days  Plan: -Zosyn to end on 2/11 -Will sign off. Please contact pharmacy with any other needs.    Height: 6\' 2"  (188 cm) Weight: 253 lb 12 oz (115.1 kg) IBW/kg (Calculated) : 82.2  Temp (24hrs), Avg:98.5 F (36.9 C), Min:97.9 F (36.6 C), Max:99.2 F (37.3 C)   Recent Labs Lab 11/27/16 0000 11/27/16 2021 11/28/16 5809 11/28/16 1815 11/28/16 2023 11/29/16 0357 11/30/16 0523 12/01/16 0224  WBC 14.9*  --  14.0*  --   --  14.0* 23.4* 18.0*  CREATININE 1.97* 1.61* 1.58*  --   --  1.82* 1.83* 1.57*  LATICACIDVEN  --   --   --  1.9 1.9  --   --   --     Estimated Creatinine Clearance: 66.7 mL/min (by C-G formula based on SCr of 1.57 mg/dL (H)).    Allergies  Allergen Reactions  . Codeine Anaphylaxis and Nausea And Vomiting  . Hydrocodone Other (See Comments)    On MAR  . Hydrocodone-Acetaminophen Nausea And Vomiting  . Tegaderm Ag Mesh [Silver] Other (See Comments)    On MAR  . Lisinopril Itching and Rash  . Tylenol [Acetaminophen] Nausea Only    Harland German, Pharm D 12/01/2016 10:36 AM

## 2016-12-01 NOTE — Progress Notes (Addendum)
Patient given medication whole in applesauce. Needed continuous reminders to tuck chin. Patient drank Nectar thick water 3-4 sips with no visible issues but needed reminder to tuck chin and small sips. On last drink patient began to cough followed by a decrease in oxygen sat decreased to 83% for several minutes around 2135. Encouraged cough, deep breath and IS. Patient repositioned.

## 2016-12-02 ENCOUNTER — Inpatient Hospital Stay (HOSPITAL_COMMUNITY): Payer: Medicare Other

## 2016-12-02 DIAGNOSIS — S72145A Nondisplaced intertrochanteric fracture of left femur, initial encounter for closed fracture: Principal | ICD-10-CM

## 2016-12-02 DIAGNOSIS — I5021 Acute systolic (congestive) heart failure: Secondary | ICD-10-CM

## 2016-12-02 DIAGNOSIS — J449 Chronic obstructive pulmonary disease, unspecified: Secondary | ICD-10-CM

## 2016-12-02 LAB — CBC
HEMATOCRIT: 44.7 % (ref 39.0–52.0)
Hemoglobin: 15.4 g/dL (ref 13.0–17.0)
MCH: 33.3 pg (ref 26.0–34.0)
MCHC: 34.5 g/dL (ref 30.0–36.0)
MCV: 96.8 fL (ref 78.0–100.0)
PLATELETS: 202 10*3/uL (ref 150–400)
RBC: 4.62 MIL/uL (ref 4.22–5.81)
RDW: 15 % (ref 11.5–15.5)
WBC: 16.9 10*3/uL — ABNORMAL HIGH (ref 4.0–10.5)

## 2016-12-02 LAB — CULTURE, BLOOD (ROUTINE X 2): Culture: NO GROWTH

## 2016-12-02 LAB — BASIC METABOLIC PANEL
ANION GAP: 13 (ref 5–15)
BUN: 61 mg/dL — AB (ref 6–20)
CO2: 30 mmol/L (ref 22–32)
Calcium: 9.6 mg/dL (ref 8.9–10.3)
Chloride: 99 mmol/L — ABNORMAL LOW (ref 101–111)
Creatinine, Ser: 1.97 mg/dL — ABNORMAL HIGH (ref 0.61–1.24)
GFR calc Af Amer: 40 mL/min — ABNORMAL LOW (ref >60)
GFR, EST NON AFRICAN AMERICAN: 35 mL/min — AB (ref >60)
GLUCOSE: 175 mg/dL — AB (ref 65–99)
POTASSIUM: 4.9 mmol/L (ref 3.5–5.1)
Sodium: 142 mmol/L (ref 135–145)

## 2016-12-02 LAB — GLUCOSE, CAPILLARY
GLUCOSE-CAPILLARY: 190 mg/dL — AB (ref 65–99)
GLUCOSE-CAPILLARY: 158 mg/dL — AB (ref 65–99)
GLUCOSE-CAPILLARY: 166 mg/dL — AB (ref 65–99)
GLUCOSE-CAPILLARY: 227 mg/dL — AB (ref 65–99)
Glucose-Capillary: 222 mg/dL — ABNORMAL HIGH (ref 65–99)

## 2016-12-02 MED ORDER — FUROSEMIDE 10 MG/ML IJ SOLN
60.0000 mg | Freq: Every day | INTRAMUSCULAR | Status: DC
  Administered 2016-12-02: 60 mg via INTRAVENOUS
  Filled 2016-12-02: qty 6

## 2016-12-02 MED ORDER — FUROSEMIDE 10 MG/ML IJ SOLN: 80.0000 mg | Freq: Every day | INTRAMUSCULAR | Status: DC

## 2016-12-02 MED ORDER — MORPHINE SULFATE (PF) 2 MG/ML IV SOLN
INTRAVENOUS | Status: AC
  Filled 2016-12-02: qty 1

## 2016-12-02 NOTE — Progress Notes (Signed)
Daily Progress Note   Patient Name: Curtis Keller       Date: 12/02/2016 DOB: October 11, 1956  Age: 61 y.o. MRN#: [id number] Attending Physician: Jonetta Osgood, MD Primary Care Physician: Gildardo Cranker, DO Admit Date: 11/26/2016  Reason for Consultation/Follow-up: Establishing goals of care  Subjective: Curtis Keller continues to be lethargic but arouses to voice and will nod head but fall back asleep. He has also recently received haldol and morphine prn doses. Appears comfortable.   Length of Stay: 5  Current Medications: Scheduled Meds:  . aspirin EC  81 mg Oral Daily  . atorvastatin  40 mg Oral QHS  . busPIRone  10 mg Oral BID  . divalproex  125 mg Oral TID  . famotidine  20 mg Oral BID  . feeding supplement (ENSURE ENLIVE)  237 mL Oral BID BM  . FLUoxetine  60 mg Oral Daily  . furosemide  60 mg Intravenous Daily  . heparin subcutaneous  5,000 Units Subcutaneous Q8H  . hydrALAZINE  12.5 mg Oral q12n4p  . insulin aspart  0-9 Units Subcutaneous TID WC  . ipratropium-albuterol  3 mL Nebulization TID  . mouth rinse  15 mL Mouth Rinse BID  . metoprolol succinate  25 mg Oral Daily  . mupirocin ointment  1 application Nasal BID  . nicotine  21 mg Transdermal Daily  . piperacillin-tazobactam (ZOSYN)  IV  3.375 g Intravenous Q8H  . polyethylene glycol  17 g Oral Daily  . potassium chloride  20 mEq Oral Daily  . predniSONE  20 mg Oral Q breakfast  . pregabalin  75 mg Oral Daily  . senna-docusate  1 tablet Oral BID    Continuous Infusions: . sodium chloride 10 mL/hr at 12/01/16 1949    PRN Meds: acetaminophen, haloperidol lactate, methocarbamol, morphine injection, oxyCODONE-acetaminophen, RESOURCE THICKENUP CLEAR  Physical Exam  Constitutional: He appears well-developed. He  appears lethargic.  HENT:  Head: Normocephalic and atraumatic.  Cardiovascular: Normal rate.   Occasional PVC  Pulmonary/Chest: Effort normal. No accessory muscle usage. No tachypnea. No respiratory distress.  Abdominal: Normal appearance.  Neurological: He appears lethargic. He is disoriented.  Nursing note and vitals reviewed.           Vital Signs: BP 136/88 (BP Location: Left Arm)   Pulse 95   Temp 98.9 F (  37.2 C) (Oral)   Resp (!) 23   Ht 6' 2"  (1.88 m)   Wt 115.1 kg (253 lb 12 oz)   SpO2 90%   BMI 32.58 kg/m  SpO2: SpO2: 90 % O2 Device: O2 Device: Nasal Cannula O2 Flow Rate: O2 Flow Rate (L/min): 2 L/min  Intake/output summary:  Intake/Output Summary (Last 24 hours) at 12/02/16 1040 Last data filed at 12/02/16 0500  Gross per 24 hour  Intake              250 ml  Output             4435 ml  Net            -4185 ml   LBM: Last BM Date: 11/30/16 Baseline Weight: Weight: 102.1 kg (225 lb) Most recent weight: Weight: 115.1 kg (253 lb 12 oz)       Palliative Assessment/Data:    Flowsheet Rows   Flowsheet Row Most Recent Value  Intake Tab  Referral Department  Hospitalist  Unit at Time of Referral  ICU  Palliative Care Primary Diagnosis  Cardiac  Date Notified  11/30/16  Palliative Care Type  New Palliative care  Reason for referral  Clarify Goals of Care  Date of Admission  11/26/16  Date first seen by Palliative Care  11/30/16  # of days Palliative referral response time  0 Day(s)  # of days IP prior to Palliative referral  4  Clinical Assessment  Psychosocial & Spiritual Assessment  Palliative Care Outcomes      Patient Active Problem List   Diagnosis Date Noted  . Ischemic cardiomyopathy   . Goals of care, counseling/discussion   . Palliative care encounter   . Acute systolic congestive heart failure ([place])   . Acute and chronic respiratory failure with hypoxia ([place]) 11/28/2016  . Hip fracture ([place]) 11/27/2016  . Closed nondisplaced  intertrochanteric fracture of left femur ([place]) 11/27/2016  . Chronic respiratory failure ([place]) 11/27/2016  . Fall at nursing home 07/17/2016  . Diabetes mellitus, type II, insulin dependent ([place]) 06/12/2016  . GERD (gastroesophageal reflux disease) 05/07/2016  . COPD (chronic obstructive pulmonary disease) ([place]) 04/18/2016  . Chronic anticoagulation 03/22/2016  . S/P splenectomy 03/13/2016  . PEG (percutaneous endoscopic gastrostomy) status ([place]) 03/13/2016  . MRSA (methicillin resistant Staphylococcus aureus) septicemia ([place]) 03/13/2016  . Aspiration pneumonia ([place]) 03/13/2016  . Dysphagia following cerebrovascular accident 03/13/2016  . Acute blood loss anemia 03/13/2016  . Hyperlipidemia 03/13/2016  . Heart failure, chronic systolic ([place])   . Hypertension   . Depression   . Status post tracheostomy ([place])   . Cardiomyopathy ([place])   . Apical mural thrombus   . Protein calorie malnutrition ([place])   . Intraparenchymal hematoma of brain due to trauma [facility])     Palliative Care Assessment & Plan   HPI: 61 y.o. male  with past medical history ofHTN, systolic CHF EF 38-25%,KNL, DM type II, AAA, COPD, chronic respiratory failure on 2 L of NCoxygen at night, CVA with residual left-sided hemiparesis/dysphagia, apical thrombus on Coumadin, splenectomy s/p fall with complications resulting in tracheostomy x 6 weeks admitted on 11/26/2016 with left hip fracture s/p fall at SNF. Awaiting hip repair d/t concern for respiratory status aspiration pneumonia and heart failure exacerbation. High risk for post-op delirium as well as he is already exhibiting signs of delirium.  After reviewing chart further I do see noted history of brief CPR s/t to accidental overdose with opiates and benzos Sept 2016 as  well as history of alcohol induced liver disease noted that admission as well.   Assessment: I met at Curtis Keller bedside again with his sister, [name]. Per RN patient has been cleared for surgery and  awaiting ortho to find out when surgery will be scheduled. [name] has many questions that I addressed. She also tells me that she and her mother have had blood clots as well and seems to think that her mother had some sort of clotting disorder but she cannot recall what this was. Also updated [name] about concern for aspiration last night. WBC trending down and CXR improved.   Family continues to be hopeful for improvement and request full aggressive care (this is his desire per MOST form scanned into electronic record as well). Awaiting surgery.   Recommendations/Plan:  Pain: Continue percocet and morphine prn.   Delirium: Continue haldol prn. May be more problematic post op.   Goals of Care and Additional Recommendations:  Limitations on Scope of Treatment: Full Scope Treatment  Code Status:  Full code  Prognosis:   Unable to determine  Discharge Planning:  To Be Determined   Thank you for allowing the Palliative Medicine Team to assist in the care of this patient.   Total Time 50mn Prolonged Time Billed  no       Greater than 50%  of this time was spent counseling and coordinating care related to the above assessment and plan.  AVinie Sill NP Palliative Medicine Team Pager # [phone](M-F 8a-5p) Team Phone # [phone](Nights/Weekends)

## 2016-12-02 NOTE — Progress Notes (Signed)
PROGRESS NOTE        PATIENT DETAILS Name: Curtis Keller Age: 61 y.o. Sex: male Date of Birth: [birth year] Admit Date: 11/26/2016 Admitting Physician Clydie Braun, MD ZOX:[id number], Maxine Glenn, DO  Brief Narrative: Patient is a 61 y.o. male with history chronic systolic heart failure (EF 25-30%), history of LV thrombus, prior CVA with chronic left-sided hemiplegia-multiple falls over the past few years causing splenic rupture-presented to the hospital on 2/3 following a mechanical fall, found to have a left hip fracture. Unfortunately hospital course has been complicated by development of acute hypoxic respiratory failure, felt due to aspiration PNA vs acute systolic CHF.   Subjective: Per RN staff-may have aspirated small amount of liquids overnight-briefly required Venti mask with 50 % FIO2-however this am-able to maintain O2 sats > 90 on just 2 L of Oxygen. Lying comfortably-no SOB or chest pain  Assessment/Plan: Closed hip fracture: Secondary to a mechanical fall-unfortunately before hip repair could be attempted-patient developed hypoxic resp failure. Thankfully with Abx/IV Lasix-his O2 requirements are coming down-now on 2L of oxygen. Although improved, given frailty and overall situation-he will remain at risk for ventilator dependence post operatively.Will await input from Ortho service, regarding timing of hip repair. Per cardiology will be at moderate risk for cardiac complications perioperatively.  Acute hypoxemic respiratory failure secondary to  aspiration pneumonia AND acute on chronic systolic heart failure:Improving with decreasing O2 requirements-after initiation of Lasix/Abx. CXR with some improvement today.Patienty had a aspiration event on 2/5-subsequently moved to stepdown and started on empiric Vanco/Zosyn. Since blood  negative, Vanco has been discontinued, he remains of Zosyn with stop date of 2/11.Since creatinine has increased today-will decrease  dose of Lasix today.  Weight not measured today, but -5 L so far. He has a broken hip-and requires surgical intervention-and will be at risk of ventilator dependence post operatively. Steroid being tapered off-as unlikely to have COPD exac  Dysphagia: Speech therapy following, underwent modified barium swallow-this is a chronic issue-patient has a prior history of CVA-he previously had a tube in place. Recommendations are to try a dysphagia 3 solids with nectar thick liquids. Will discuss with family over the next few days.  History of left apical thrombus: Previously on Coumadin-currently on hold for surgery. Given high risk for venous thromboembolism post surgery, suspect will require short-term anticoagulation. Long discussion with patient's sister at bedside on 2/7, she would prefer short-term anticoagulation and is willing to accept all risks.  Hx of COPD: not in exacerbation-see above.   Acute kidney injury: Improving-Suspect prerenal azotemia-hemodynamic mediated injury  History of CVA with chronic left sided weakness: Previously on anticoagulation-left-sided deficits are unchanged. Continue statin. See above regarding anticoagulation  GERD: Continue Pepcid  Type 2 diabetes: CBGs stable-continue SSI  History of anxiety/depression/mild dementia: Continue Depakote, fluoxetine and BuSpar.  Frequent falls: Has had frequent falls in the past few years-one episode of fall resulted in a splenic laceration requiring splenectomy. Unfortunately, patient also has a history of LV thrombus and a CVA with left hemiplegia. Although he is a poor long term candidate, he is at significant risk of having venous thromboembolism post-hip surgery. Long discussion with the patient's sister on 2/7(who is a nurse at bedside)-she is agreeable to restart anticoagulation for the short-term.  Palliative care: Remains a full code, poor long-term prognosis-multiple medical comorbidities. Long discussion with the  patient's sister, appreciate palliative  care input.   DVT Prophylaxis: Prophylactic Heparin   Code Status: Full code   Family Communication: None at bedside  Disposition Plan: Remain inpatient-remain in SDU-SNF on discharge-probably next week  Antimicrobial agents: Anti-infectives    Start     Dose/Rate Route Frequency Ordered Stop   11/28/16 1530  piperacillin-tazobactam (ZOSYN) IVPB 3.375 g     3.375 g 12.5 mL/hr over 240 Minutes Intravenous Every 8 hours 11/28/16 1518 12/04/16 2359   11/28/16 1530  vancomycin (VANCOCIN) IVPB 750 mg/150 ml premix  Status:  Discontinued     750 mg 150 mL/hr over 60 Minutes Intravenous Every 12 hours 11/28/16 1518 12/01/16 1031   11/28/16 1520  ceFAZolin (ANCEF) IVPB 2g/100 mL premix  Status:  Discontinued     2 g 200 mL/hr over 30 Minutes Intravenous To ShortStay Surgical 11/28/16 1247 11/28/16 2334   11/28/16 1000  piperacillin-tazobactam (ZOSYN) IVPB 3.375 g  Status:  Discontinued     3.375 g 12.5 mL/hr over 240 Minutes Intravenous Every 8 hours 11/28/16 0832 11/28/16 1518   11/28/16 1000  vancomycin (VANCOCIN) IVPB 750 mg/150 ml premix  Status:  Discontinued     750 mg 150 mL/hr over 60 Minutes Intravenous Every 12 hours 11/28/16 0832 11/28/16 1518      Procedures: Echo 2/5 - Very limited image quality, however LVEF is severely impaired,   estimated at 25-30% with akinesis of all of the apical segments   and mid anteroseptal, anterior and anterolateral segments.   There is no thrombus on the contrast images.   A cardiac MRI is recommended for further LVEF evaluation.   CONSULTS:  cardiology and orthopedic surgery  Time spent: 25- minutes-Greater than 50% of this time was spent in counseling, explanation of diagnosis, planning of further management, and coordination of care.  MEDICATIONS: Scheduled Meds: . aspirin EC  81 mg Oral Daily  . atorvastatin  40 mg Oral QHS  . busPIRone  10 mg Oral BID  . Chlorhexidine Gluconate  Cloth  6 each Topical Q0600  . divalproex  125 mg Oral TID  . famotidine  20 mg Oral BID  . feeding supplement (ENSURE ENLIVE)  237 mL Oral BID BM  . FLUoxetine  60 mg Oral Daily  . [START ON 12/03/2016] furosemide  80 mg Intravenous Daily  . heparin subcutaneous  5,000 Units Subcutaneous Q8H  . hydrALAZINE  12.5 mg Oral q12n4p  . insulin aspart  0-9 Units Subcutaneous TID WC  . ipratropium-albuterol  3 mL Nebulization TID  . mouth rinse  15 mL Mouth Rinse BID  . metoprolol succinate  25 mg Oral Daily  . mupirocin ointment  1 application Nasal BID  . nicotine  21 mg Transdermal Daily  . piperacillin-tazobactam (ZOSYN)  IV  3.375 g Intravenous Q8H  . polyethylene glycol  17 g Oral Daily  . potassium chloride  20 mEq Oral Daily  . predniSONE  20 mg Oral Q breakfast  . pregabalin  75 mg Oral Daily  . senna-docusate  1 tablet Oral BID   Continuous Infusions: . sodium chloride 10 mL/hr at 12/01/16 1949   PRN Meds:.acetaminophen, haloperidol lactate, methocarbamol, morphine injection, oxyCODONE-acetaminophen, RESOURCE THICKENUP CLEAR   PHYSICAL EXAM: Vital signs: Vitals:   12/02/16 0230 12/02/16 0400 12/02/16 0600 12/02/16 0700  BP:  106/75 (!) 81/60 136/88  Pulse: 78 80 80 79  Resp: 16 17 17 17   Temp:  98.9 F (37.2 C)    TempSrc:  Oral    SpO2: 91% 93%  98% 98%  Weight:      Height:       Filed Weights   11/26/16 2359 11/29/16 0352 11/30/16 0500  Weight: 102.1 kg (225 lb) 115 kg (253 lb 8.5 oz) 115.1 kg (253 lb 12 oz)   Body mass index is 32.58 kg/m.   General appearance :Awake, alert, not in any distress. Speech Clear. Eyes:, pupils equally reactive to light and accomodation,no scleral icterus. HEENT: Atraumatic and Normocephalic Neck: supple, no JVD. No cervical lymphadenopathy. No thyromegaly Resp:Good air entry bilaterally, clear to auscultation anteriorly CVS: S1 S2 regular GI: Bowel sounds present, Non tender and not distended with no gaurding, rigidity or  rebound.No organomegaly Extremities: B/L Lower Ext shows no edema, both legs are warm to touch Neurology: Left hemiplegia Musculoskeletal:No digital cyanosis Skin:No Rash, warm and dry Wounds:N/A  I have personally reviewed following labs and imaging studies  LABORATORY DATA: CBC:  Recent Labs Lab 11/27/16 0000 11/28/16 0838 11/29/16 0357 11/30/16 0523 12/01/16 0224 12/02/16 0342  WBC 14.9* 14.0* 14.0* 23.4* 18.0* 16.9*  NEUTROABS 9.4* 8.4* 12.4*  --   --   --   HGB 15.2 15.6 14.6 13.5 14.0 15.4  HCT 45.4 44.9 43.3 40.0 40.4 44.7  MCV 97.6 97.6 98.0 96.4 95.5 96.8  PLT 174 131* 152 169 178 202    Basic Metabolic Panel:  Recent Labs Lab 11/28/16 0838 11/28/16 1012 11/29/16 0357 11/30/16 0523 12/01/16 0224 12/02/16 0342  NA 138  --  136 139 141 142  K 4.5  --  4.1 3.9 4.0 4.9  CL 104  --  101 101 101 99*  CO2 22  --  23 24 27 30   GLUCOSE 118*  --  212* 193* 139* 175*  BUN 26*  --  32* 43* 49* 61*  CREATININE 1.58*  --  1.82* 1.83* 1.57* 1.97*  CALCIUM 8.7*  --  9.0 9.2 9.4 9.6  MG  --  1.8 1.8 2.0  --   --     GFR: Estimated Creatinine Clearance: 53.1 mL/min (by C-G formula based on SCr of 1.97 mg/dL (H)).  Liver Function Tests:  Recent Labs Lab 11/27/16 0000 11/28/16 0838 11/29/16 0357  AST 32 22 27  ALT 29 18 17   ALKPHOS 82 70 61  BILITOT 0.7 0.5 0.9  PROT 7.1 7.0 7.1  ALBUMIN 3.1* 2.6* 2.4*   No results for input(s): LIPASE, AMYLASE in the last 168 hours. No results for input(s): AMMONIA in the last 168 hours.  Coagulation Profile:  Recent Labs Lab 11/27/16 0000 11/28/16 1605 11/29/16 0357 11/30/16 0523 12/01/16 0224  INR 2.31 2.49 2.46 1.31 1.20    Cardiac Enzymes:  Recent Labs Lab 11/27/16 0000 11/27/16 0346 11/27/16 1557 11/27/16 2022  TROPONINI 0.03* 0.04* 0.03* 0.05*    BNP (last 3 results) No results for input(s): PROBNP in the last 8760 hours.  HbA1C: No results for input(s): HGBA1C in the last 72  hours.  CBG:  Recent Labs Lab 12/01/16 0804 12/01/16 1156 12/01/16 1614 12/01/16 2132 12/02/16 0411  GLUCAP 148* 185* 221* 152* 190*    Lipid Profile: No results for input(s): CHOL, HDL, LDLCALC, TRIG, CHOLHDL, LDLDIRECT in the last 72 hours.  Thyroid Function Tests: No results for input(s): TSH, T4TOTAL, FREET4, T3FREE, THYROIDAB in the last 72 hours.  Anemia Panel: No results for input(s): VITAMINB12, FOLATE, FERRITIN, TIBC, IRON, RETICCTPCT in the last 72 hours.  Urine analysis:    Component Value Date/Time   COLORURINE YELLOW 11/27/2016 0355  APPEARANCEUR CLEAR 11/27/2016 0355   LABSPEC 1.015 11/27/2016 0355   PHURINE 6.0 11/27/2016 0355   GLUCOSEU NEGATIVE 11/27/2016 0355   HGBUR LARGE (A) 11/27/2016 0355   BILIRUBINUR NEGATIVE 11/27/2016 0355   KETONESUR NEGATIVE 11/27/2016 0355   PROTEINUR 100 (A) 11/27/2016 0355   NITRITE NEGATIVE 11/27/2016 0355   LEUKOCYTESUR NEGATIVE 11/27/2016 0355    Sepsis Labs: Lactic Acid, Venous    Component Value Date/Time   LATICACIDVEN 1.9 11/28/2016 2023    MICROBIOLOGY: Recent Results (from the past 240 hour(s))  Surgical pcr screen     Status: Abnormal   Collection Time: 11/27/16  3:58 PM  Result Value Ref Range Status   MRSA, PCR POSITIVE (A) NEGATIVE Final   Staphylococcus aureus POSITIVE (A) NEGATIVE Final    Comment:        The Xpert SA Assay (FDA approved for NASAL specimens in patients over 87 years of age), is one component of a comprehensive surveillance program.  Test performance has been validated by [facility] for patients greater than or equal to 23 year old. It is not intended to diagnose infection nor to guide or monitor treatment. RESULT CALLED TO, READ BACK BY AND VERIFIED WITH: C FOUSHEE,RN AT 1818 11/27/16 BY L BENFIELD   Culture, blood (routine x 2)     Status: None (Preliminary result)   Collection Time: 11/27/16  9:52 PM  Result Value Ref Range Status   Specimen Description BLOOD RIGHT  HAND  Final   Special Requests BOTTLES DRAWN AEROBIC AND ANAEROBIC  5CC EA  Final   Culture NO GROWTH 4 DAYS  Final   Report Status PENDING  Incomplete  Culture, blood (routine x 2)     Status: None (Preliminary result)   Collection Time: 11/27/16  9:52 PM  Result Value Ref Range Status   Specimen Description BLOOD RIGHT HAND  Final   Special Requests BOTTLES DRAWN AEROBIC AND ANAEROBIC  5CC EA  Final   Culture NO GROWTH 3 DAYS  Final   Report Status PENDING  Incomplete    RADIOLOGY STUDIES/RESULTS: Dg Chest 1 View  Result Date: 11/27/2016 CLINICAL DATA:  Larey Seat today.  Left hip fracture. EXAM: CHEST 1 VIEW COMPARISON:  02/24/2016 FINDINGS: Diffuse interstitial coarsening is probably chronic. No consolidation. No effusion. No pneumothorax. New unchanged hilar, mediastinal and cardiac contours. IMPRESSION: Chronic appearing interstitial coarsening. No consolidation or effusion. Electronically Signed   By: Ellery Plunk M.D.   On: 11/27/2016 01:14   Dg Pelvis 1-2 Views  Result Date: 11/27/2016 CLINICAL DATA:  Larey Seat today while trying to get out of bed unassisted. EXAM: PELVIS - 1-2 VIEW COMPARISON:  None. FINDINGS: There is an acute subcapital left hip fracture. No dislocation. No bone lesion or bony destruction. Pelvis appears intact. IMPRESSION: Subcapital left hip fracture Electronically Signed   By: Ellery Plunk M.D.   On: 11/27/2016 01:13   Ct Head Wo Contrast  Result Date: 11/27/2016 CLINICAL DATA:  Larey Seat getting out of bed today, acute LEFT hip fracture. History of hypertension, stroke. EXAM: CT HEAD WITHOUT CONTRAST TECHNIQUE: Contiguous axial images were obtained from the base of the skull through the vertex without intravenous contrast. COMPARISON:  CT HEAD January 26, 2016 FINDINGS: BRAIN: No intraparenchymal hemorrhage, mass effect, midline shift or acute large vascular territory infarcts. Old RIGHT greater than LEFT cerebellar infarcts, severe brachium contrast atrophy. Old RIGHT  greater than LEFT pontine infarcts. Old LEFT thalamus and bilateral basal ganglia lacunar infarcts. Old small RIGHT occipital lobe infarct. LEFT  inferior temporal lobe encephalomalacia is likely posttraumatic. Mild to moderate white matter changes compatible with chronic small vessel ischemic disease. No abnormal extra-axial fluid collection. Moderate ventriculomegaly on the basis of global parenchymal brain volume loss. VASCULAR: Mild to moderate calcific atherosclerosis of the carotid siphons. SKULL: No skull fracture. No significant scalp soft tissue swelling. SINUSES/ORBITS: Bilateral maxillary mucosal retention cysts. Mastoid air cells are well aerated. The included ocular globes and orbital contents are non-suspicious. OTHER: Patient is edentulous. IMPRESSION: No acute intracranial process. Stable examination including numerous old supra- and infratentorial infarcts predominately within posterior circulation, moderate global brain atrophy. Electronically Signed   By: Awilda Metro M.D.   On: 11/27/2016 03:01   Dg Chest Port 1 View  Addendum Date: 12/01/2016   ADDENDUM REPORT: 12/01/2016 08:08 ADDENDUM: There is an old healed right clavicle fracture, stable. Electronically Signed   By: Bretta Bang III M.D.   On: 12/01/2016 08:08   Result Date: 12/01/2016 CLINICAL DATA:  Shortness of Breath EXAM: PORTABLE CHEST 1 VIEW COMPARISON:  November 30, 2016 FINDINGS: Extensive airspace consolidation bilaterally, most severe in the left upper to mid lung zones, up remains without significant change. There is cardiomegaly with pulmonary venous hypertension. No adenopathy evident. No bone lesions. IMPRESSION: Widespread airspace consolidation bilaterally, somewhat more on the left than on the right. Evidence a degree of underlying pulmonary vascular congestion. Question multifocal pneumonia versus pulmonary edema; both entities may well exist concurrently. The overall appearance of the lungs and  cardiomediastinal silhouette is stable compared to 1 day prior. Electronically Signed: By: Bretta Bang III M.D. On: 12/01/2016 08:02   Dg Chest Port 1 View  Result Date: 11/28/2016 CLINICAL DATA:  Shortness of breath. EXAM: PORTABLE CHEST 1 VIEW COMPARISON:  11/27/2016 and 02/24/2016 FINDINGS: The patient has progressive bilateral pulmonary infiltrates which may represent pulmonary edema or progressive pneumonia. Pulmonary vascularity appears normal in the overall heart size is normal. IMPRESSION: Probable progressive bilateral pneumonia. Electronically Signed   By: Francene Boyers M.D.   On: 11/28/2016 15:31   Dg Chest Port 1 View  Result Date: 11/27/2016 CLINICAL DATA:  sob EXAM: PORTABLE CHEST 1 VIEW COMPARISON:  11/27/2016 FINDINGS: The heart is enlarged. There are airspace filling opacities bilaterally, particularly well seen at the left lung base. Patient is rotated. IMPRESSION: Bilateral airspace filling opacities consistent with pulmonary edema and/or infectious process. Electronically Signed   By: Norva Pavlov M.D.   On: 11/27/2016 22:17   Dg Chest Port 1v Same Day  Result Date: 11/30/2016 CLINICAL DATA:  Shortness of breath, cough today, pneumonia, history hypertension, prior respiratory failure, coronary disease, chronic systolic CHF, ischemic cardiomyopathy EXAM: PORTABLE CHEST 1 VIEW COMPARISON:  Portable exam 0922 hours compared to 11/28/2016 FINDINGS: Rotated to the LEFT with kyphotic positioning. Enlargement of cardiac silhouette. Stable mediastinal contours for degree of rotation. Extensive BILATERAL pulmonary infiltrates favor pneumonia over edema. No pleural effusion or pneumothorax. Bones unremarkable. IMPRESSION: Persistent BILATERAL pulmonary infiltrates question pneumonia. Enlargement of cardiac silhouette. Electronically Signed   By: Ulyses Southward M.D.   On: 11/30/2016 09:31   Dg Swallowing Func-speech Pathology  Result Date: 11/30/2016 Objective Swallowing Evaluation: Type  of Study: MBS-Modified Barium Swallow Study Patient Details Name: Curtis Keller MRN: [id number] Date of Birth: 10/07/1956 Today's Date: 11/30/2016 Time: SLP Start Time (ACUTE ONLY): 1310-SLP Stop Time (ACUTE ONLY): 1325 SLP Time Calculation (min) (ACUTE ONLY): 15 min Past Medical History: Past Medical History: Diagnosis Date . Acute respiratory failure (HCC)   a. Spring 2017 following  fall/splenectomy -->admitted to [facility]-->trach/g tube. . Anemia   a. 12/2015 ABL in setting of fall/hematomas/splenic laceration req splenectomy. Marland Kitchen Apical mural thrombus   a. 11/2015 Echo: EF 25-30%, moderate size apical thrombus-->coumadin;  b. 12/2015 f/u Echo: EF 25-30%, ant/septa HK, no obvious large thrombus but cannot exclude small mural thrombus. Marland Kitchen CAD (coronary artery disease)   a. 12/2014 s/p BMS to the RCA. Marland Kitchen Cerebral infarction (HCC)  . Chronic systolic CHF (congestive heart failure) (HCC)   a. 12/2015 Echo: EF 25-30%. . Dementia  . Depression  . Dysphagia  . Falls   a. 11/2015 traumatic fall with resultant trauma req splenectomy and prolonged hospitalization complicated by resp failure. Marland Kitchen History of pneumonia  . Hyperlipidemia  . Hypertension  . Ischemic cardiomyopathy   a. 12/2015 Echo: EF 25-30%, anterior and septal HK. Marland Kitchen Left hemiplegia (HCC)  . Protein calorie malnutrition (HCC)  . Respiratory failure (HCC)  . Spleen injury  . Status post tracheostomy (HCC)   a. 01/2016 in setting of ongoing resp failure and aspiration. Past Surgical History: Past Surgical History: Procedure Laterality Date . CARDIAC CATHETERIZATION   . CORONARY ANGIOPLASTY WITH STENT PLACEMENT   . IR GENERIC HISTORICAL  07/22/2016  IR GASTROSTOMY TUBE REMOVAL 07/22/2016 [name], MD [facility]-INTERV RAD . PR LAP, SPLENECTOMY  01/07/2016 . TRACHEOSTOMY TUBE PLACEMENT N/A 02/22/2016  Procedure: TRACHEOSTOMY;  Surgeon: [name], MD;  Location: [facility] OR;  Service: ENT;  Laterality: N/A; HPI: Pt. with PMH of HTN, chronic systolic CHF, chronic  respiratory failure on 3L of oxygen, diabetes mellitus, recurrent fall, chronic anticoagulation, dyslipidemia; admitted on 11/26/2016, with complaint of A mechanical fall, was found to have closed left hip fracture.Patient developed aspiration pneumonia in the hospital. Currently surgery is on hold until pneumonia is better. Per MD pt was on dys 2/nectar thick liquids at SNF, but has been on thin liquids here. Pt had an MBS during admission to [facility] in 2017, but record not available. Pt also had a trach at that time. Reproted has a history of dysphagia due to prior CVAs.  No Data Recorded Assessment / Plan / Recommendation CHL IP CLINICAL IMPRESSIONS 11/30/2016 Therapy Diagnosis Severe pharyngeal phase dysphagia Clinical Impression Pt demonstrates a severe oropharygneal dysphagia with decreased duration and magnitude of hyolaryngeal excursion leading to severe oropharyngeal residuals that are aspirated with and without sensation in further swallow attempts. Base of tongue propulsion of bolus is also poor and epiglottis does not deflect for airway protection. Thin liquids pose a severe risk of aspiration and nectar thick liquids reduce severity of aspiration events, but are still likely to be aspirated due to increased residuals with thick viscosity. A chin tuck significantly improves bolus transit if fully tucked against chest. Would suspect that carry over for this will be poor, but will trial dys 3 solids and nectar thick liquids with full supervision for a chin tuck to determine if it is achievable.  Impact on safety and function Severe aspiration risk   CHL IP TREATMENT RECOMMENDATION 11/30/2016 Treatment Recommendations Therapy as outlined in treatment plan below   Prognosis 11/30/2016 Prognosis for Safe Diet Advancement Guarded Barriers to Reach Goals Severity of deficits;Time post onset Barriers/Prognosis Comment -- CHL IP DIET RECOMMENDATION 11/30/2016 SLP Diet Recommendations Dysphagia 3 (Mech  soft) solids;Nectar thick liquid Liquid Administration via Cup;Straw Medication Administration Whole meds with puree Compensations Chin tuck;Slow rate;Small sips/bites;Minimize environmental distractions Postural Changes Remain semi-upright after after feeds/meals (Comment);Seated upright at 90 degrees   CHL IP OTHER  RECOMMENDATIONS 11/30/2016 Recommended Consults -- Oral Care Recommendations Oral care BID Other Recommendations Order thickener from pharmacy   CHL IP FOLLOW UP RECOMMENDATIONS 11/30/2016 Follow up Recommendations Skilled Nursing facility   [facility] IP FREQUENCY AND DURATION 11/30/2016 Speech Therapy Frequency (ACUTE ONLY) min 2x/week Treatment Duration 2 weeks      CHL IP ORAL PHASE 11/30/2016 Oral Phase WFL Oral - Pudding Teaspoon -- Oral - Pudding Cup -- Oral - Honey Teaspoon -- Oral - Honey Cup -- Oral - Nectar Teaspoon -- Oral - Nectar Cup -- Oral - Nectar Straw -- Oral - Thin Teaspoon -- Oral - Thin Cup -- Oral - Thin Straw -- Oral - Puree -- Oral - Mech Soft -- Oral - Regular -- Oral - Multi-Consistency -- Oral - Pill -- Oral Phase - Comment --  CHL IP PHARYNGEAL PHASE 11/30/2016 Pharyngeal Phase Impaired Pharyngeal- Pudding Teaspoon -- Pharyngeal -- Pharyngeal- Pudding Cup -- Pharyngeal -- Pharyngeal- Honey Teaspoon -- Pharyngeal -- Pharyngeal- Honey Cup -- Pharyngeal -- Pharyngeal- Nectar Teaspoon -- Pharyngeal -- Pharyngeal- Nectar Cup Reduced epiglottic inversion;Reduced anterior laryngeal mobility;Reduced airway/laryngeal closure;Reduced tongue base retraction;Delayed swallow initiation-pyriform sinuses;Pharyngeal residue - valleculae;Pharyngeal residue - pyriform;Compensatory strategies attempted (with notebox) Pharyngeal -- Pharyngeal- Nectar Straw Reduced epiglottic inversion;Reduced anterior laryngeal mobility;Reduced airway/laryngeal closure;Reduced tongue base retraction;Delayed swallow initiation-pyriform sinuses;Pharyngeal residue - valleculae;Pharyngeal residue - pyriform;Compensatory strategies  attempted (with notebox);Other (Comment) Pharyngeal -- Pharyngeal- Thin Teaspoon -- Pharyngeal -- Pharyngeal- Thin Cup Reduced epiglottic inversion;Reduced anterior laryngeal mobility;Reduced airway/laryngeal closure;Reduced tongue base retraction;Delayed swallow initiation-pyriform sinuses;Pharyngeal residue - valleculae;Pharyngeal residue - pyriform;Penetration/Apiration after swallow;Significant aspiration (Amount) Pharyngeal Material enters airway, passes BELOW cords and not ejected out despite cough attempt by patient Pharyngeal- Thin Straw Reduced epiglottic inversion;Reduced anterior laryngeal mobility;Reduced airway/laryngeal closure;Reduced tongue base retraction;Delayed swallow initiation-pyriform sinuses;Pharyngeal residue - valleculae;Pharyngeal residue - pyriform;Compensatory strategies attempted (with notebox);Penetration/Aspiration during swallow Pharyngeal Material enters airway, passes BELOW cords without attempt by patient to eject out (silent aspiration) Pharyngeal- Puree Reduced epiglottic inversion;Reduced anterior laryngeal mobility;Reduced airway/laryngeal closure;Reduced tongue base retraction;Pharyngeal residue - valleculae;Pharyngeal residue - pyriform;Compensatory strategies attempted (with notebox);Delayed swallow initiation-vallecula Pharyngeal -- Pharyngeal- Mechanical Soft Reduced epiglottic inversion;Reduced anterior laryngeal mobility;Reduced airway/laryngeal closure;Reduced tongue base retraction;Pharyngeal residue - valleculae;Pharyngeal residue - pyriform;Compensatory strategies attempted (with notebox);Delayed swallow initiation-vallecula Pharyngeal -- Pharyngeal- Regular -- Pharyngeal -- Pharyngeal- Multi-consistency -- Pharyngeal -- Pharyngeal- Pill Reduced epiglottic inversion;Reduced anterior laryngeal mobility;Reduced airway/laryngeal closure;Reduced tongue base retraction;Pharyngeal residue - valleculae;Pharyngeal residue - pyriform;Compensatory strategies attempted (with  notebox);Delayed swallow initiation-vallecula Pharyngeal -- Pharyngeal Comment --  No flowsheet data found. CHL IP GO 11/29/2016 Functional Assessment Tool Used clinical judgement Functional Limitations Swallowing Swallow Current Status [phone]) CJ Swallow Goal Status (H0865) CJ Swallow Discharge Status (H8469) (None) Motor Speech Current Status [phone]) (None) Motor Speech Goal Status (W4132) (None) Motor Speech Goal Status (G4010) (None) Spoken Language Comprehension Current Status (U7253) (None) Spoken Language Comprehension Goal Status (G6440) (None) Spoken Language Comprehension Discharge Status [phone]) (None) Spoken Language Expression Current Status [phone]) (None) Spoken Language Expression Goal Status [phone]) (None) Spoken Language Expression Discharge Status [phone]) (None) Attention Current Status (J8841) (None) Attention Goal Status (Y6063) (None) Attention Discharge Status [phone]) (None) Memory Current Status (U9323) (None) Memory Goal Status (F5732) (None) Memory Discharge Status (K0254) (None) Voice Current Status (Y7062) (None) Voice Goal Status (B7628) (None) Voice Discharge Status (B1517) (None) Other Speech-Language Pathology Functional Limitation [phone]) (None) Other Speech-Language Pathology Functional Limitation Goal Status (P7106) (None) Other Speech-Language Pathology Functional Limitation Discharge Status [phone]) (None) Harlon Ditty, MA CCC-SLP [phone] [place], Riley Nearing 11/30/2016,  2:50 PM              Dg Femur Min 2 Views Left  Result Date: 11/27/2016 CLINICAL DATA:  Larey Seat while attempting to get out of bed unassisted today. EXAM: LEFT FEMUR 2 VIEWS COMPARISON:  None. FINDINGS: There is a subcapital left hip fracture. Remainder of the femur is intact. No dislocation. No bone lesion or bony destruction. IMPRESSION: Subcapital left hip fracture Electronically Signed   By: Ellery Plunk M.D.   On: 11/27/2016 01:13     LOS: 5 days   Jeoffrey Massed, MD  [facility] Pager:[phone]  If 7PM-7AM, please contact night-coverage [email or web address] Password [facility] 12/02/2016, 7:49 AM

## 2016-12-02 NOTE — Progress Notes (Signed)
SLP Cancellation Note  Patient Details Name: Curtis Keller MRN: [id number] DOB: May 28, 1956   Cancelled treatment:       Reason Eval/Treat Not Completed: Fatigue/lethargy limiting ability to participate. Pt was given haldol and morphine earlier today, and is currently insufficiently arousable for po intake and assessment of diet tolerance. Will continue efforts.  Kinzey Sheriff B. Murvin Natal [facility], CCC-SLP [phone]  Leigh Aurora 12/02/2016, 1:39 PM

## 2016-12-02 NOTE — Progress Notes (Signed)
Progress Note  Patient Name: Curtis Keller Date of Encounter: 12/02/2016  Primary Cardiologist: Dr. Desma Maxim [facility])  Subjective    Feels better today; breathing better; no dyspnea.  Inpatient Medications    Scheduled Meds: . aspirin EC  81 mg Oral Daily  . atorvastatin  40 mg Oral QHS  . busPIRone  10 mg Oral BID  . divalproex  125 mg Oral TID  . famotidine  20 mg Oral BID  . feeding supplement (ENSURE ENLIVE)  237 mL Oral BID BM  . FLUoxetine  60 mg Oral Daily  . furosemide  60 mg Intravenous Daily  . heparin subcutaneous  5,000 Units Subcutaneous Q8H  . hydrALAZINE  12.5 mg Oral q12n4p  . insulin aspart  0-9 Units Subcutaneous TID WC  . ipratropium-albuterol  3 mL Nebulization TID  . mouth rinse  15 mL Mouth Rinse BID  . metoprolol succinate  25 mg Oral Daily  . mupirocin ointment  1 application Nasal BID  . nicotine  21 mg Transdermal Daily  . piperacillin-tazobactam (ZOSYN)  IV  3.375 g Intravenous Q8H  . polyethylene glycol  17 g Oral Daily  . potassium chloride  20 mEq Oral Daily  . predniSONE  20 mg Oral Q breakfast  . pregabalin  75 mg Oral Daily  . senna-docusate  1 tablet Oral BID   Continuous Infusions: . sodium chloride 10 mL/hr at 12/01/16 1949   PRN Meds: acetaminophen, haloperidol lactate, methocarbamol, morphine injection, oxyCODONE-acetaminophen, RESOURCE THICKENUP CLEAR   Vital Signs    Vitals:   12/02/16 0600 12/02/16 0700 12/02/16 0750 12/02/16 0815  BP: (!) 81/60 136/88 136/88   Pulse: 80 79 95   Resp: 17 17 (!) 23   Temp:   98.9 F (37.2 C)   TempSrc:   Oral   SpO2: 98% 98% 90% 90%  Weight:      Height:        Intake/Output Summary (Last 24 hours) at 12/02/16 0928 Last data filed at 12/02/16 0500  Gross per 24 hour  Intake              450 ml  Output             4435 ml  Net            -3985 ml   I/O since admission: -5905  Filed Weights   11/26/16 2359 11/29/16 0352 11/30/16 0500  Weight: 225 lb (102.1 kg) 253 lb  8.5 oz (115 kg) 253 lb 12 oz (115.1 kg)    Telemetry    SR in the 80s-90s with PVCs - Personally Reviewed  ECG    ECG (independently read by me): NSR at 66, ILBBB, QS v1-2; small Q's inferiorly  Physical Exam   BP 136/88 (BP Location: Left Arm)   Pulse 95   Temp 98.9 F (37.2 C) (Oral)   Resp (!) 23   Ht 6\' 2"  (1.88 m)   Wt 253 lb 12 oz (115.1 kg)   SpO2 90%   BMI 32.58 kg/m    GEN:  No acute distress.   Skin: mild rash Neck: No JVD Cardiac: RRR with rare ectopic beat, faint 1/6 SEM; no s3, no rub Respiratory:  slightly decreased; no wheezing GI: Soft, nontender, non-distended  MS: No edema; No deformity. Left leg flexed, DP slightly reduced. +1 on left. Neuro:  Nonfocal  Psych: Normal affect   Labs    Chemistry  Recent Labs Lab 11/27/16 0000  11/28/16  6948 11/29/16 0357 11/30/16 0523 12/01/16 0224 12/02/16 0342  NA 138  < > 138 136 139 141 142  K 4.3  < > 4.5 4.1 3.9 4.0 4.9  CL 102  < > 104 101 101 101 99*  CO2 24  < > 22 23 24 27 30   GLUCOSE 137*  < > 118* 212* 193* 139* 175*  BUN 23*  < > 26* 32* 43* 49* 61*  CREATININE 1.97*  < > 1.58* 1.82* 1.83* 1.57* 1.97*  CALCIUM 9.0  < > 8.7* 9.0 9.2 9.4 9.6  PROT 7.1  --  7.0 7.1  --   --   --   ALBUMIN 3.1*  --  2.6* 2.4*  --   --   --   AST 32  --  22 27  --   --   --   ALT 29  --  18 17  --   --   --   ALKPHOS 82  --  70 61  --   --   --   BILITOT 0.7  --  0.5 0.9  --   --   --   GFRNONAA 35*  < > 46* 38* 38* 46* 35*  GFRAA 40*  < > 53* 45* 44* 53* 40*  ANIONGAP 12  < > 12 12 14 13 13   < > = values in this interval not displayed.   Hematology  Recent Labs Lab 11/30/16 0523 12/01/16 0224 12/02/16 0342  WBC 23.4* 18.0* 16.9*  RBC 4.15* 4.23 4.62  HGB 13.5 14.0 15.4  HCT 40.0 40.4 44.7  MCV 96.4 95.5 96.8  MCH 32.5 33.1 33.3  MCHC 33.8 34.7 34.5  RDW 14.7 14.9 15.0  PLT 169 178 202    Cardiac Enzymes  Recent Labs Lab 11/27/16 0000 11/27/16 0346 11/27/16 1557 11/27/16 2022    TROPONINI 0.03* 0.04* 0.03* 0.05*   No results for input(s): TROPIPOC in the last 168 hours.   BNP  Recent Labs Lab 11/27/16 0000 11/28/16 1431  BNP 311.3* 355.6*    Lipid Panel  No results found for: CHOL, TRIG, HDL, CHOLHDL, VLDL, LDLCALC, LDLDIRECT  DDimer No results for input(s): DDIMER in the last 168 hours.   Radiology    Dg Chest Port 1 View  Result Date: 12/02/2016 CLINICAL DATA:  Dyspnea EXAM: PORTABLE CHEST 1 VIEW COMPARISON:  Chest radiograph from one day prior. FINDINGS: Slightly right rotated chest radiograph. Stable cardiomediastinal silhouette with mild cardiomegaly. No pneumothorax. No pleural effusion. Mild pulmonary edema, decreased. IMPRESSION: Mild congestive heart failure, improved. Electronically Signed   By: Delbert Phenix M.D.   On: 12/02/2016 08:06   Dg Chest Port 1 View  Addendum Date: 12/01/2016   ADDENDUM REPORT: 12/01/2016 08:08 ADDENDUM: There is an old healed right clavicle fracture, stable. Electronically Signed   By: Bretta Bang III M.D.   On: 12/01/2016 08:08   Result Date: 12/01/2016 CLINICAL DATA:  Shortness of Breath EXAM: PORTABLE CHEST 1 VIEW COMPARISON:  November 30, 2016 FINDINGS: Extensive airspace consolidation bilaterally, most severe in the left upper to mid lung zones, up remains without significant change. There is cardiomegaly with pulmonary venous hypertension. No adenopathy evident. No bone lesions. IMPRESSION: Widespread airspace consolidation bilaterally, somewhat more on the left than on the right. Evidence a degree of underlying pulmonary vascular congestion. Question multifocal pneumonia versus pulmonary edema; both entities may well exist concurrently. The overall appearance of the lungs and cardiomediastinal silhouette is stable compared to 1 day prior.  Electronically Signed: By: Bretta Bang III M.D. On: 12/01/2016 08:02   Dg Chest Port 1v Same Day  Result Date: 11/30/2016 CLINICAL DATA:  Shortness of breath, cough today,  pneumonia, history hypertension, prior respiratory failure, coronary disease, chronic systolic CHF, ischemic cardiomyopathy EXAM: PORTABLE CHEST 1 VIEW COMPARISON:  Portable exam 0922 hours compared to 11/28/2016 FINDINGS: Rotated to the LEFT with kyphotic positioning. Enlargement of cardiac silhouette. Stable mediastinal contours for degree of rotation. Extensive BILATERAL pulmonary infiltrates favor pneumonia over edema. No pleural effusion or pneumothorax. Bones unremarkable. IMPRESSION: Persistent BILATERAL pulmonary infiltrates question pneumonia. Enlargement of cardiac silhouette. Electronically Signed   By: Ulyses Southward M.D.   On: 11/30/2016 09:31   Dg Swallowing Func-speech Pathology  Result Date: 11/30/2016 Objective Swallowing Evaluation: Type of Study: MBS-Modified Barium Swallow Study Patient Details Name: JAMEIL WHITMOYER MRN: [id number] Date of Birth: 09-27-1956 Today's Date: 11/30/2016 Time: SLP Start Time (ACUTE ONLY): 1310-SLP Stop Time (ACUTE ONLY): 1325 SLP Time Calculation (min) (ACUTE ONLY): 15 min Past Medical History: Past Medical History: Diagnosis Date . Acute respiratory failure (HCC)   a. Spring 2017 following fall/splenectomy -->admitted to [facility]-->trach/g tube. . Anemia   a. 12/2015 ABL in setting of fall/hematomas/splenic laceration req splenectomy. Marland Kitchen Apical mural thrombus   a. 11/2015 Echo: EF 25-30%, moderate size apical thrombus-->coumadin;  b. 12/2015 f/u Echo: EF 25-30%, ant/septa HK, no obvious large thrombus but cannot exclude small mural thrombus. Marland Kitchen CAD (coronary artery disease)   a. 12/2014 s/p BMS to the RCA. Marland Kitchen Cerebral infarction (HCC)  . Chronic systolic CHF (congestive heart failure) (HCC)   a. 12/2015 Echo: EF 25-30%. . Dementia  . Depression  . Dysphagia  . Falls   a. 11/2015 traumatic fall with resultant trauma req splenectomy and prolonged hospitalization complicated by resp failure. Marland Kitchen History of pneumonia  . Hyperlipidemia  . Hypertension  . Ischemic  cardiomyopathy   a. 12/2015 Echo: EF 25-30%, anterior and septal HK. Marland Kitchen Left hemiplegia (HCC)  . Protein calorie malnutrition (HCC)  . Respiratory failure (HCC)  . Spleen injury  . Status post tracheostomy (HCC)   a. 01/2016 in setting of ongoing resp failure and aspiration. Past Surgical History: Past Surgical History: Procedure Laterality Date . CARDIAC CATHETERIZATION   . CORONARY ANGIOPLASTY WITH STENT PLACEMENT   . IR GENERIC HISTORICAL  07/22/2016  IR GASTROSTOMY TUBE REMOVAL 07/22/2016 [name], MD [facility]-INTERV RAD . PR LAP, SPLENECTOMY  01/07/2016 . TRACHEOSTOMY TUBE PLACEMENT N/A 02/22/2016  Procedure: TRACHEOSTOMY;  Surgeon: [name], MD;  Location: [facility] OR;  Service: ENT;  Laterality: N/A; HPI: Pt. with PMH of HTN, chronic systolic CHF, chronic respiratory failure on 3L of oxygen, diabetes mellitus, recurrent fall, chronic anticoagulation, dyslipidemia; admitted on 11/26/2016, with complaint of A mechanical fall, was found to have closed left hip fracture.Patient developed aspiration pneumonia in the hospital. Currently surgery is on hold until pneumonia is better. Per MD pt was on dys 2/nectar thick liquids at SNF, but has been on thin liquids here. Pt had an MBS during admission to [facility] in 2017, but record not available. Pt also had a trach at that time. Reproted has a history of dysphagia due to prior CVAs.  No Data Recorded Assessment / Plan / Recommendation CHL IP CLINICAL IMPRESSIONS 11/30/2016 Therapy Diagnosis Severe pharyngeal phase dysphagia Clinical Impression Pt demonstrates a severe oropharygneal dysphagia with decreased duration and magnitude of hyolaryngeal excursion leading to severe oropharyngeal residuals that are aspirated with and without sensation in further  swallow attempts. Base of tongue propulsion of bolus is also poor and epiglottis does not deflect for airway protection. Thin liquids pose a severe risk of aspiration and nectar thick liquids reduce severity of  aspiration events, but are still likely to be aspirated due to increased residuals with thick viscosity. A chin tuck significantly improves bolus transit if fully tucked against chest. Would suspect that carry over for this will be poor, but will trial dys 3 solids and nectar thick liquids with full supervision for a chin tuck to determine if it is achievable.  Impact on safety and function Severe aspiration risk   CHL IP TREATMENT RECOMMENDATION 11/30/2016 Treatment Recommendations Therapy as outlined in treatment plan below   Prognosis 11/30/2016 Prognosis for Safe Diet Advancement Guarded Barriers to Reach Goals Severity of deficits;Time post onset Barriers/Prognosis Comment -- CHL IP DIET RECOMMENDATION 11/30/2016 SLP Diet Recommendations Dysphagia 3 (Mech soft) solids;Nectar thick liquid Liquid Administration via Cup;Straw Medication Administration Whole meds with puree Compensations Chin tuck;Slow rate;Small sips/bites;Minimize environmental distractions Postural Changes Remain semi-upright after after feeds/meals (Comment);Seated upright at 90 degrees   CHL IP OTHER RECOMMENDATIONS 11/30/2016 Recommended Consults -- Oral Care Recommendations Oral care BID Other Recommendations Order thickener from pharmacy   CHL IP FOLLOW UP RECOMMENDATIONS 11/30/2016 Follow up Recommendations Skilled Nursing facility   [facility] IP FREQUENCY AND DURATION 11/30/2016 Speech Therapy Frequency (ACUTE ONLY) min 2x/week Treatment Duration 2 weeks      CHL IP ORAL PHASE 11/30/2016 Oral Phase WFL Oral - Pudding Teaspoon -- Oral - Pudding Cup -- Oral - Honey Teaspoon -- Oral - Honey Cup -- Oral - Nectar Teaspoon -- Oral - Nectar Cup -- Oral - Nectar Straw -- Oral - Thin Teaspoon -- Oral - Thin Cup -- Oral - Thin Straw -- Oral - Puree -- Oral - Mech Soft -- Oral - Regular -- Oral - Multi-Consistency -- Oral - Pill -- Oral Phase - Comment --  CHL IP PHARYNGEAL PHASE 11/30/2016 Pharyngeal Phase Impaired Pharyngeal- Pudding Teaspoon -- Pharyngeal --  Pharyngeal- Pudding Cup -- Pharyngeal -- Pharyngeal- Honey Teaspoon -- Pharyngeal -- Pharyngeal- Honey Cup -- Pharyngeal -- Pharyngeal- Nectar Teaspoon -- Pharyngeal -- Pharyngeal- Nectar Cup Reduced epiglottic inversion;Reduced anterior laryngeal mobility;Reduced airway/laryngeal closure;Reduced tongue base retraction;Delayed swallow initiation-pyriform sinuses;Pharyngeal residue - valleculae;Pharyngeal residue - pyriform;Compensatory strategies attempted (with notebox) Pharyngeal -- Pharyngeal- Nectar Straw Reduced epiglottic inversion;Reduced anterior laryngeal mobility;Reduced airway/laryngeal closure;Reduced tongue base retraction;Delayed swallow initiation-pyriform sinuses;Pharyngeal residue - valleculae;Pharyngeal residue - pyriform;Compensatory strategies attempted (with notebox);Other (Comment) Pharyngeal -- Pharyngeal- Thin Teaspoon -- Pharyngeal -- Pharyngeal- Thin Cup Reduced epiglottic inversion;Reduced anterior laryngeal mobility;Reduced airway/laryngeal closure;Reduced tongue base retraction;Delayed swallow initiation-pyriform sinuses;Pharyngeal residue - valleculae;Pharyngeal residue - pyriform;Penetration/Apiration after swallow;Significant aspiration (Amount) Pharyngeal Material enters airway, passes BELOW cords and not ejected out despite cough attempt by patient Pharyngeal- Thin Straw Reduced epiglottic inversion;Reduced anterior laryngeal mobility;Reduced airway/laryngeal closure;Reduced tongue base retraction;Delayed swallow initiation-pyriform sinuses;Pharyngeal residue - valleculae;Pharyngeal residue - pyriform;Compensatory strategies attempted (with notebox);Penetration/Aspiration during swallow Pharyngeal Material enters airway, passes BELOW cords without attempt by patient to eject out (silent aspiration) Pharyngeal- Puree Reduced epiglottic inversion;Reduced anterior laryngeal mobility;Reduced airway/laryngeal closure;Reduced tongue base retraction;Pharyngeal residue -  valleculae;Pharyngeal residue - pyriform;Compensatory strategies attempted (with notebox);Delayed swallow initiation-vallecula Pharyngeal -- Pharyngeal- Mechanical Soft Reduced epiglottic inversion;Reduced anterior laryngeal mobility;Reduced airway/laryngeal closure;Reduced tongue base retraction;Pharyngeal residue - valleculae;Pharyngeal residue - pyriform;Compensatory strategies attempted (with notebox);Delayed swallow initiation-vallecula Pharyngeal -- Pharyngeal- Regular -- Pharyngeal -- Pharyngeal- Multi-consistency -- Pharyngeal -- Pharyngeal- Pill Reduced epiglottic inversion;Reduced anterior laryngeal mobility;Reduced airway/laryngeal closure;Reduced  tongue base retraction;Pharyngeal residue - valleculae;Pharyngeal residue - pyriform;Compensatory strategies attempted (with notebox);Delayed swallow initiation-vallecula Pharyngeal -- Pharyngeal Comment --  No flowsheet data found. CHL IP GO 11/29/2016 Functional Assessment Tool Used clinical judgement Functional Limitations Swallowing Swallow Current Status [id number] CJ Swallow Goal Status (U0454) CJ Swallow Discharge Status (U9811) (None) Motor Speech Current Status [phone]) (None) Motor Speech Goal Status (G9562) (None) Motor Speech Goal Status (Z3086) (None) Spoken Language Comprehension Current Status (V7846) (None) Spoken Language Comprehension Goal Status (N6295) (None) Spoken Language Comprehension Discharge Status [phone]) (None) Spoken Language Expression Current Status [phone]) (None) Spoken Language Expression Goal Status [phone]) (None) Spoken Language Expression Discharge Status [phone]) (None) Attention Current Status (I3474) (None) Attention Goal Status (Q5956) (None) Attention Discharge Status [phone]) (None) Memory Current Status (E3329) (None) Memory Goal Status (J1884) (None) Memory Discharge Status (Z6606) (None) Voice Current Status (T0160) (None) Voice Goal Status (F0932) (None) Voice Discharge Status (T5573) (None) Other Speech-Language Pathology  Functional Limitation [phone]) (None) Other Speech-Language Pathology Functional Limitation Goal Status (K2706) (None) Other Speech-Language Pathology Functional Limitation Discharge Status [phone]) (None) Harlon Ditty, MA CCC-SLP [phone] [place], Riley Nearing 11/30/2016, 2:50 PM               Cardiac Studies   TTE: 11/28/16  Study Conclusions  - Left ventricle: The cavity size was normal. Systolic function was   severely reduced. The estimated ejection fraction was in the   range of 25% to 30%. Wall motion was normal; there were no   regional wall motion abnormalities. Doppler parameters are   consistent with abnormal left ventricular relaxation (grade 1   diastolic dysfunction). There was no evidence of elevated   ventricular filling pressure by Doppler parameters. - Mitral valve: There was trivial regurgitation. - Left atrium: The atrium was mildly dilated. - Right ventricle: Systolic function was normal. - Pulmonic valve: Not visualized. There was no regurgitation. - Inferior vena cava: The vessel was normal in size.  Impressions:  - Very limited image quality, however LVEF is severely impaired,   estimated at 25-30% with akinesis of all of the apical segments   and mid anteroseptal, anterior and anterolateral segments.   There is no thrombus on the contrast images.   A cardiac MRI is recommended for further LVEF evaluation.  Patient Profile     61 y.o. male h/o CAD s/p BMS to the RCA, ICM/systolic CHF, prior CVA, HTN, HL, freq fall, tob abuse, LV thrombus on coumadin, and dementia, who was admitted 2/3 from his SNF with left leg pain after a fall from bed resulting in left femoral neck fx. Cardiology was asked to provider preop eval.   Assessment & Plan    1.  L Hip Fx/Preoperative Cardiac Evaluation:  Pt with a h/o CAD s/p BMS of the RCA in 12/2014 and ICM/syst CHF with EF of 25-30% by echo in 12/2015.  He was admitted 2/4 after falling out of bed late on 2/3.  Noted to  have subcapital L hip fx.  From a cardiac perspective, he reports no recent c/p, dyspnea, pnd, orthopnea, edema, early satiety, presyncope, or syncope. Moderate risk for cardiac complications for perioperative hip surgery.  In the absence of Ss, no further ischemic evaluation. Continue ? blocker therapy throughout the perioperative period.  For ortho surgery, ? Timing.  Obtain ECG post-op.  2.  CAD:  S/p prior BMS  RCA in 12/2014.  No recent anginal symptoms. Continue ? blocker and statin. Will titrate Topral slightly today to 37.5 mg daily and  further titrate as BP allows.  3.  LV Thrombus:  Noted on echo 11/2015.  Not well appreciated on 12/2015 echo.  He has been on coumadin for at least a year based on records.  11/28/16 Echo  with EF 25 - 30% with apical and anterior, anteroseptal and anterolateral akinesis. No thrombus was noted but he had been on warfarin anticoagulation. According to family member pt had had MI initially in 1999, and has had several additional events.  Echo wall motion abnormality suggests LAD infarct. Although no thrombus presently, the substrate remains for recurrent thrombus with apical akinesis. With recurrent fall risk he may not be a candidate for continued systemic anticoagulation. Now on ASA and sq heparin. Will need to re-asses post-op.   4. Ischemic cardiomyopathy:  EF 25 - 30%. F/U BNP 11/28/16 at 355  I/O -5905 since admission. Good u/o - 5,905 since admission.   5. Aspiration Pneumonia: now on Vanc/Zosyn  CXR 2/5 demonstrated progressive pneumonia changes vs CHF with bilateral infiltrates.  WBC increased  23.4 yesterday improved to 18.0 today may also be contributed by steroids. Swallowing study shows severe oropharygneal dysphagia with decreased duration and magnitude of hyolaryngeal excursion leading to severe oropharyngeal residuals that are aspirated.  6. Essential HTN: BP stable today at 132/89; if BP allows with LV dysfunction may ultimately benefit from low dose  hydralazine; started at 12.5 mg bid.  BP today stable will change to 12.5 mg q 8 hrs today.  7. HLD:  Continue atorvastatin 40 mg daily.  8. CKD Stage 3: Cr increased to 1.97 today; GFR 35; follow closely.  Signed, Nicki Gu[place]ara, MD, [facility] 12/02/2016, 9:28 AM

## 2016-12-03 ENCOUNTER — Inpatient Hospital Stay (HOSPITAL_COMMUNITY): Payer: Medicare Other

## 2016-12-03 DIAGNOSIS — S72145D Nondisplaced intertrochanteric fracture of left femur, subsequent encounter for closed fracture with routine healing: Secondary | ICD-10-CM

## 2016-12-03 LAB — GLUCOSE, CAPILLARY
GLUCOSE-CAPILLARY: 180 mg/dL — AB (ref 65–99)
GLUCOSE-CAPILLARY: 209 mg/dL — AB (ref 65–99)
GLUCOSE-CAPILLARY: 190 mg/dL — AB (ref 65–99)
GLUCOSE-CAPILLARY: 200 mg/dL — AB (ref 65–99)

## 2016-12-03 LAB — BASIC METABOLIC PANEL
ANION GAP: 15 (ref 5–15)
BUN: 68 mg/dL — ABNORMAL HIGH (ref 6–20)
CALCIUM: 9.5 mg/dL (ref 8.9–10.3)
CO2: 31 mmol/L (ref 22–32)
Chloride: 101 mmol/L (ref 101–111)
Creatinine, Ser: 2.11 mg/dL — ABNORMAL HIGH (ref 0.61–1.24)
GFR, EST AFRICAN AMERICAN: 37 mL/min — AB (ref >60)
GFR, EST NON AFRICAN AMERICAN: 32 mL/min — AB (ref >60)
GLUCOSE: 179 mg/dL — AB (ref 65–99)
POTASSIUM: 4.2 mmol/L (ref 3.5–5.1)
SODIUM: 147 mmol/L — AB (ref 135–145)

## 2016-12-03 LAB — CULTURE, BLOOD (ROUTINE X 2): Culture: NO GROWTH

## 2016-12-03 LAB — CBC
HCT: 48.3 % (ref 39.0–52.0)
Hemoglobin: 16.3 g/dL (ref 13.0–17.0)
MCH: 33.3 pg (ref 26.0–34.0)
MCHC: 33.7 g/dL (ref 30.0–36.0)
MCV: 98.6 fL (ref 78.0–100.0)
PLATELETS: 209 10*3/uL (ref 150–400)
RBC: 4.9 MIL/uL (ref 4.22–5.81)
RDW: 15.3 % (ref 11.5–15.5)
WBC: 16.9 10*3/uL — AB (ref 4.0–10.5)

## 2016-12-03 MED ORDER — PREDNISONE 10 MG PO TABS
10.0000 mg | ORAL_TABLET | Freq: Every day | ORAL | Status: AC
  Administered 2016-12-04: 10 mg via ORAL
  Filled 2016-12-03: qty 1

## 2016-12-03 MED ORDER — FENTANYL CITRATE (PF) 100 MCG/2ML IJ SOLN
25.0000 ug | INTRAMUSCULAR | Status: DC | PRN
  Administered 2016-12-03 – 2016-12-08 (×9): 50 ug via INTRAVENOUS
  Filled 2016-12-03 (×10): qty 2

## 2016-12-03 NOTE — Progress Notes (Addendum)
PROGRESS NOTE        PATIENT DETAILS Name: Curtis Keller Age: 61 y.o. Sex: male Date of Birth: 1956/01/11 Admit Date: 11/26/2016 Admitting Physician Clydie Braun, MD WUJ:[id number], Maxine Glenn, DO  Brief Narrative: Patient is a 61 y.o. male with history chronic systolic heart failure (EF 25-30%), history of LV thrombus, prior CVA with chronic left-sided hemiplegia-multiple falls over the past few years causing splenic rupture-presented to the hospital on 2/3 following a mechanical fall, found to have a left hip fracture. Unfortunately hospital course has been complicated by development of acute hypoxic respiratory failure, felt due to aspiration PNA vs acute systolic CHF.   Subjective: Lying comfortably in bed-his O2 requirements are coming down-now on 3 L of oxygen. Denies CP or SOB  Assessment/Plan: Closed Left hip fracture: Secondary to a mechanical fall-unfortunately before hip repair could be attempted-patient developed hypoxic resp failure. Thankfully with Abx/IV Lasix-his O2 requirements are coming down-now on 3 L of oxygen. Although improved, given frailty and overall situation-he will remain at risk for ventilator dependence post operatively.Spoke with Dr. Charlann Boxer yesterday, tentatively planning on hip repair on 2/12. Per cardiology will be at moderate risk for cardiac complications perioperatively.  Acute hypoxemic respiratory failure secondary to aspiration pneumonia and acute on chronic systolic heart failure:Improving with decreasing O2 requirements-after initiation of Lasix/Abx. CXR with some improvement on 2/9.Patienty had a aspiration event on 2/5-subsequently moved to stepdown and started on empiric Vanco/Zosyn. Since blood  negative, Vanco has been discontinued, he remains of Zosyn with stop date of 2/11.Since creatinine continues to increase today, we will hold Lasix. Weight not measured today, but -7.3 L so far. He has a broken hip-and requires surgical  intervention-and will be at risk of ventilator dependence post operatively. Steroid being tapered off-as unlikely to have COPD exac  Dysphagia: Speech therapy following, underwent modified barium swallow-this is a chronic issue-patient has a prior history of CVA-he previously had a tube in place. Recommendations are to try a dysphagia 3 solids with nectar thick liquids. Spoke with patient's sister over the phone on 2/10, she is aware of the aspiration risk and wishes to continue with Dys 3 diet with nectar thick liquid.  History of left apical thrombus: Previously on Coumadin-currently on hold for surgery. Given high risk for venous thromboembolism post surgery, suspect will require short-term anticoagulation. Long discussion with patient's sister at bedside on 2/7, she would prefer short-term anticoagulation and is willing to accept all risks.  Leukocytosis: Suspect secondary to steroids and pneumonia-slowly decreasing-continue to follow periodically.  Hx of COPD: not in exacerbation-see above.   Acute kidney injury: Improving-Suspect prerenal azotemia-hemodynamic mediated injury  History of CVA with chronic left sided weakness: Previously on anticoagulation-left-sided deficits are unchanged. Continue statin. See above regarding anticoagulation  GERD: Continue Pepcid  Type 2 diabetes: CBGs stable-continue SSI  History of anxiety/depression/mild dementia: Continue Depakote, fluoxetine and BuSpar.  Frequent falls: Has had frequent falls in the past few years-one episode of fall resulted in a splenic laceration requiring splenectomy. Unfortunately, patient also has a history of LV thrombus and a CVA with left hemiplegia. Although he is a poor long term candidate, he is at significant risk of having venous thromboembolism post-hip surgery. Long discussion with the patient's sister on 2/7(who is a nurse at bedside)-she is agreeable to restart anticoagulation post operatively for the  short-term.  Palliative care: Remains  a full code, poor long-term prognosis-multiple medical comorbidities. Long discussion with the patient's sister, appreciate palliative care input.   DVT Prophylaxis: Prophylactic Heparin   Code Status: Full code   Family Communication: None at bedside-but spoke with patient's sister over the phone-although patient has a daughter-the patient's sister she has not seen him in months and has not been to the hospital yet. Patients Sister will update patient's daughter.  Disposition Plan: Remain inpatient-remain in SDU-SNF on discharge  Antimicrobial agents: Anti-infectives    Start     Dose/Rate Route Frequency Ordered Stop   11/28/16 1530  piperacillin-tazobactam (ZOSYN) IVPB 3.375 g     3.375 g 12.5 mL/hr over 240 Minutes Intravenous Every 8 hours 11/28/16 1518 12/04/16 2359   11/28/16 1530  vancomycin (VANCOCIN) IVPB 750 mg/150 ml premix  Status:  Discontinued     750 mg 150 mL/hr over 60 Minutes Intravenous Every 12 hours 11/28/16 1518 12/01/16 1031   11/28/16 1520  ceFAZolin (ANCEF) IVPB 2g/100 mL premix  Status:  Discontinued     2 g 200 mL/hr over 30 Minutes Intravenous To ShortStay Surgical 11/28/16 1247 11/28/16 2334   11/28/16 1000  piperacillin-tazobactam (ZOSYN) IVPB 3.375 g  Status:  Discontinued     3.375 g 12.5 mL/hr over 240 Minutes Intravenous Every 8 hours 11/28/16 0832 11/28/16 1518   11/28/16 1000  vancomycin (VANCOCIN) IVPB 750 mg/150 ml premix  Status:  Discontinued     750 mg 150 mL/hr over 60 Minutes Intravenous Every 12 hours 11/28/16 0832 11/28/16 1518      Procedures: Echo 2/5 - Very limited image quality, however LVEF is severely impaired,   estimated at 25-30% with akinesis of all of the apical segments   and mid anteroseptal, anterior and anterolateral segments.   There is no thrombus on the contrast images.   A cardiac MRI is recommended for further LVEF evaluation.   CONSULTS:  cardiology and orthopedic  surgery  Time spent: 25- minutes-Greater than 50% of this time was spent in counseling, explanation of diagnosis, planning of further management, and coordination of care.  MEDICATIONS: Scheduled Meds: . aspirin EC  81 mg Oral Daily  . atorvastatin  40 mg Oral QHS  . busPIRone  10 mg Oral BID  . divalproex  125 mg Oral TID  . famotidine  20 mg Oral BID  . feeding supplement (ENSURE ENLIVE)  237 mL Oral BID BM  . FLUoxetine  60 mg Oral Daily  . heparin subcutaneous  5,000 Units Subcutaneous Q8H  . hydrALAZINE  12.5 mg Oral q12n4p  . insulin aspart  0-9 Units Subcutaneous TID WC  . ipratropium-albuterol  3 mL Nebulization TID  . mouth rinse  15 mL Mouth Rinse BID  . metoprolol succinate  25 mg Oral Daily  . nicotine  21 mg Transdermal Daily  . piperacillin-tazobactam (ZOSYN)  IV  3.375 g Intravenous Q8H  . polyethylene glycol  17 g Oral Daily  . potassium chloride  20 mEq Oral Daily  . predniSONE  20 mg Oral Q breakfast  . pregabalin  75 mg Oral Daily  . senna-docusate  1 tablet Oral BID   Continuous Infusions: . sodium chloride 10 mL/hr at 12/01/16 1949   PRN Meds:.acetaminophen, fentaNYL (SUBLIMAZE) injection, haloperidol lactate, methocarbamol, oxyCODONE-acetaminophen, RESOURCE THICKENUP CLEAR   PHYSICAL EXAM: Vital signs: Vitals:   12/03/16 0030 12/03/16 0200 12/03/16 0400 12/03/16 0747  BP: (!) 125/100 (!) 144/98  (!) 157/108  Pulse: 97 97 99 99  Resp: 19 20 (!)  22 (!) 24  Temp:   99.5 F (37.5 C) 98.3 F (36.8 C)  TempSrc:   Oral Oral  SpO2: 93% 97% 92% 94%  Weight:      Height:       Filed Weights   11/26/16 2359 11/29/16 0352 11/30/16 0500  Weight: 102.1 kg (225 lb) 115 kg (253 lb 8.5 oz) 115.1 kg (253 lb 12 oz)   Body mass index is 32.58 kg/m.   General appearance :Awake, alert, not in any distress. Speech Clear. Eyes:, pupils equally reactive to light and accomodation,no scleral icterus. HEENT: Atraumatic and Normocephalic Neck: supple, no JVD. No  cervical lymphadenopathy. No thyromegaly Resp:Good air entry bilaterally, clear to auscultation anteriorly CVS: S1 S2 regular GI: Bowel sounds present, Non tender and not distended with no gaurding, rigidity or rebound.No organomegaly Extremities: B/L Lower Ext shows no edema, both legs are warm to touch Neurology: Left hemiplegia Musculoskeletal:No digital cyanosis Skin:No Rash, warm and dry Wounds:N/A  I have personally reviewed following labs and imaging studies  LABORATORY DATA: CBC:  Recent Labs Lab 11/27/16 0000 11/28/16 8119 11/29/16 0357 11/30/16 0523 12/01/16 0224 12/02/16 0342 12/03/16 0320  WBC 14.9* 14.0* 14.0* 23.4* 18.0* 16.9* 16.9*  NEUTROABS 9.4* 8.4* 12.4*  --   --   --   --   HGB 15.2 15.6 14.6 13.5 14.0 15.4 16.3  HCT 45.4 44.9 43.3 40.0 40.4 44.7 48.3  MCV 97.6 97.6 98.0 96.4 95.5 96.8 98.6  PLT 174 131* 152 169 178 202 209    Basic Metabolic Panel:  Recent Labs Lab 11/28/16 1012 11/29/16 0357 11/30/16 0523 12/01/16 0224 12/02/16 0342 12/03/16 0320  NA  --  136 139 141 142 147*  K  --  4.1 3.9 4.0 4.9 4.2  CL  --  101 101 101 99* 101  CO2  --  23 24 27 30 31   GLUCOSE  --  212* 193* 139* 175* 179*  BUN  --  32* 43* 49* 61* 68*  CREATININE  --  1.82* 1.83* 1.57* 1.97* 2.11*  CALCIUM  --  9.0 9.2 9.4 9.6 9.5  MG 1.8 1.8 2.0  --   --   --     GFR: Estimated Creatinine Clearance: 49.6 mL/min (by C-G formula based on SCr of 2.11 mg/dL (H)).  Liver Function Tests:  Recent Labs Lab 11/27/16 0000 11/28/16 0838 11/29/16 0357  AST 32 22 27  ALT 29 18 17   ALKPHOS 82 70 61  BILITOT 0.7 0.5 0.9  PROT 7.1 7.0 7.1  ALBUMIN 3.1* 2.6* 2.4*   No results for input(s): LIPASE, AMYLASE in the last 168 hours. No results for input(s): AMMONIA in the last 168 hours.  Coagulation Profile:  Recent Labs Lab 11/27/16 0000 11/28/16 1605 11/29/16 0357 11/30/16 0523 12/01/16 0224  INR 2.31 2.49 2.46 1.31 1.20    Cardiac Enzymes:  Recent  Labs Lab 11/27/16 0000 11/27/16 0346 11/27/16 1557 11/27/16 2022  TROPONINI 0.03* 0.04* 0.03* 0.05*    BNP (last 3 results) No results for input(s): PROBNP in the last 8760 hours.  HbA1C: No results for input(s): HGBA1C in the last 72 hours.  CBG:  Recent Labs Lab 12/02/16 0756 12/02/16 1143 12/02/16 1613 12/02/16 2105 12/03/16 0751  GLUCAP 158* 166* 227* 222* 180*    Lipid Profile: No results for input(s): CHOL, HDL, LDLCALC, TRIG, CHOLHDL, LDLDIRECT in the last 72 hours.  Thyroid Function Tests: No results for input(s): TSH, T4TOTAL, FREET4, T3FREE, THYROIDAB in the last 72 hours.  Anemia Panel: No results for input(s): VITAMINB12, FOLATE, FERRITIN, TIBC, IRON, RETICCTPCT in the last 72 hours.  Urine analysis:    Component Value Date/Time   COLORURINE YELLOW 11/27/2016 0355   APPEARANCEUR CLEAR 11/27/2016 0355   LABSPEC 1.015 11/27/2016 0355   PHURINE 6.0 11/27/2016 0355   GLUCOSEU NEGATIVE 11/27/2016 0355   HGBUR LARGE (A) 11/27/2016 0355   BILIRUBINUR NEGATIVE 11/27/2016 0355   KETONESUR NEGATIVE 11/27/2016 0355   PROTEINUR 100 (A) 11/27/2016 0355   NITRITE NEGATIVE 11/27/2016 0355   LEUKOCYTESUR NEGATIVE 11/27/2016 0355    Sepsis Labs: Lactic Acid, Venous    Component Value Date/Time   LATICACIDVEN 1.9 11/28/2016 2023    MICROBIOLOGY: Recent Results (from the past 240 hour(s))  Surgical pcr screen     Status: Abnormal   Collection Time: 11/27/16  3:58 PM  Result Value Ref Range Status   MRSA, PCR POSITIVE (A) NEGATIVE Final   Staphylococcus aureus POSITIVE (A) NEGATIVE Final    Comment:        The Xpert SA Assay (FDA approved for NASAL specimens in patients over 60 years of age), is one component of a comprehensive surveillance program.  Test performance has been validated by [facility] for patients greater than or equal to 70 year old. It is not intended to diagnose infection nor to guide or monitor treatment. RESULT CALLED TO, READ  BACK BY AND VERIFIED WITH: C FOUSHEE,RN AT 1818 11/27/16 BY L BENFIELD   Culture, blood (routine x 2)     Status: None   Collection Time: 11/27/16  9:52 PM  Result Value Ref Range Status   Specimen Description BLOOD RIGHT HAND  Final   Special Requests BOTTLES DRAWN AEROBIC AND ANAEROBIC  5CC EA  Final   Culture NO GROWTH 5 DAYS  Final   Report Status 12/02/2016 FINAL  Final  Culture, blood (routine x 2)     Status: None (Preliminary result)   Collection Time: 11/27/16  9:52 PM  Result Value Ref Range Status   Specimen Description BLOOD RIGHT HAND  Final   Special Requests BOTTLES DRAWN AEROBIC AND ANAEROBIC  5CC EA  Final   Culture NO GROWTH 4 DAYS  Final   Report Status PENDING  Incomplete    RADIOLOGY STUDIES/RESULTS: Dg Chest 1 View  Result Date: 11/27/2016 CLINICAL DATA:  Larey Seat today.  Left hip fracture. EXAM: CHEST 1 VIEW COMPARISON:  02/24/2016 FINDINGS: Diffuse interstitial coarsening is probably chronic. No consolidation. No effusion. No pneumothorax. New unchanged hilar, mediastinal and cardiac contours. IMPRESSION: Chronic appearing interstitial coarsening. No consolidation or effusion. Electronically Signed   By: Ellery Plunk M.D.   On: 11/27/2016 01:14   Dg Pelvis 1-2 Views  Result Date: 11/27/2016 CLINICAL DATA:  Larey Seat today while trying to get out of bed unassisted. EXAM: PELVIS - 1-2 VIEW COMPARISON:  None. FINDINGS: There is an acute subcapital left hip fracture. No dislocation. No bone lesion or bony destruction. Pelvis appears intact. IMPRESSION: Subcapital left hip fracture Electronically Signed   By: Ellery Plunk M.D.   On: 11/27/2016 01:13   Ct Head Wo Contrast  Result Date: 11/27/2016 CLINICAL DATA:  Larey Seat getting out of bed today, acute LEFT hip fracture. History of hypertension, stroke. EXAM: CT HEAD WITHOUT CONTRAST TECHNIQUE: Contiguous axial images were obtained from the base of the skull through the vertex without intravenous contrast. COMPARISON:  CT HEAD  January 26, 2016 FINDINGS: BRAIN: No intraparenchymal hemorrhage, mass effect, midline shift or acute large vascular territory infarcts. Old  RIGHT greater than LEFT cerebellar infarcts, severe brachium contrast atrophy. Old RIGHT greater than LEFT pontine infarcts. Old LEFT thalamus and bilateral basal ganglia lacunar infarcts. Old small RIGHT occipital lobe infarct. LEFT inferior temporal lobe encephalomalacia is likely posttraumatic. Mild to moderate white matter changes compatible with chronic small vessel ischemic disease. No abnormal extra-axial fluid collection. Moderate ventriculomegaly on the basis of global parenchymal brain volume loss. VASCULAR: Mild to moderate calcific atherosclerosis of the carotid siphons. SKULL: No skull fracture. No significant scalp soft tissue swelling. SINUSES/ORBITS: Bilateral maxillary mucosal retention cysts. Mastoid air cells are well aerated. The included ocular globes and orbital contents are non-suspicious. OTHER: Patient is edentulous. IMPRESSION: No acute intracranial process. Stable examination including numerous old supra- and infratentorial infarcts predominately within posterior circulation, moderate global brain atrophy. Electronically Signed   By: Awilda Metro M.D.   On: 11/27/2016 03:01   Dg Chest Port 1 View  Result Date: 12/02/2016 CLINICAL DATA:  Dyspnea EXAM: PORTABLE CHEST 1 VIEW COMPARISON:  Chest radiograph from one day prior. FINDINGS: Slightly right rotated chest radiograph. Stable cardiomediastinal silhouette with mild cardiomegaly. No pneumothorax. No pleural effusion. Mild pulmonary edema, decreased. IMPRESSION: Mild congestive heart failure, improved. Electronically Signed   By: Delbert Phenix M.D.   On: 12/02/2016 08:06   Dg Chest Port 1 View  Addendum Date: 12/01/2016   ADDENDUM REPORT: 12/01/2016 08:08 ADDENDUM: There is an old healed right clavicle fracture, stable. Electronically Signed   By: Bretta Bang III M.D.   On: 12/01/2016  08:08   Result Date: 12/01/2016 CLINICAL DATA:  Shortness of Breath EXAM: PORTABLE CHEST 1 VIEW COMPARISON:  November 30, 2016 FINDINGS: Extensive airspace consolidation bilaterally, most severe in the left upper to mid lung zones, up remains without significant change. There is cardiomegaly with pulmonary venous hypertension. No adenopathy evident. No bone lesions. IMPRESSION: Widespread airspace consolidation bilaterally, somewhat more on the left than on the right. Evidence a degree of underlying pulmonary vascular congestion. Question multifocal pneumonia versus pulmonary edema; both entities may well exist concurrently. The overall appearance of the lungs and cardiomediastinal silhouette is stable compared to 1 day prior. Electronically Signed: By: Bretta Bang III M.D. On: 12/01/2016 08:02   Dg Chest Port 1 View  Result Date: 11/28/2016 CLINICAL DATA:  Shortness of breath. EXAM: PORTABLE CHEST 1 VIEW COMPARISON:  11/27/2016 and 02/24/2016 FINDINGS: The patient has progressive bilateral pulmonary infiltrates which may represent pulmonary edema or progressive pneumonia. Pulmonary vascularity appears normal in the overall heart size is normal. IMPRESSION: Probable progressive bilateral pneumonia. Electronically Signed   By: Francene Boyers M.D.   On: 11/28/2016 15:31   Dg Chest Port 1 View  Result Date: 11/27/2016 CLINICAL DATA:  sob EXAM: PORTABLE CHEST 1 VIEW COMPARISON:  11/27/2016 FINDINGS: The heart is enlarged. There are airspace filling opacities bilaterally, particularly well seen at the left lung base. Patient is rotated. IMPRESSION: Bilateral airspace filling opacities consistent with pulmonary edema and/or infectious process. Electronically Signed   By: Norva Pavlov M.D.   On: 11/27/2016 22:17   Dg Chest Port 1v Same Day  Result Date: 11/30/2016 CLINICAL DATA:  Shortness of breath, cough today, pneumonia, history hypertension, prior respiratory failure, coronary disease, chronic  systolic CHF, ischemic cardiomyopathy EXAM: PORTABLE CHEST 1 VIEW COMPARISON:  Portable exam 0922 hours compared to 11/28/2016 FINDINGS: Rotated to the LEFT with kyphotic positioning. Enlargement of cardiac silhouette. Stable mediastinal contours for degree of rotation. Extensive BILATERAL pulmonary infiltrates favor pneumonia over edema. No pleural effusion or  pneumothorax. Bones unremarkable. IMPRESSION: Persistent BILATERAL pulmonary infiltrates question pneumonia. Enlargement of cardiac silhouette. Electronically Signed   By: Ulyses Southward M.D.   On: 11/30/2016 09:31   Dg Swallowing Func-speech Pathology  Result Date: 11/30/2016 Objective Swallowing Evaluation: Type of Study: MBS-Modified Barium Swallow Study Patient Details Name: Curtis Keller MRN: [id number] Date of Birth: 1956-08-25 Today's Date: 11/30/2016 Time: SLP Start Time (ACUTE ONLY): 1310-SLP Stop Time (ACUTE ONLY): 1325 SLP Time Calculation (min) (ACUTE ONLY): 15 min Past Medical History: Past Medical History: Diagnosis Date . Acute respiratory failure (HCC)   a. Spring 2017 following fall/splenectomy -->admitted to [facility]-->trach/g tube. . Anemia   a. 12/2015 ABL in setting of fall/hematomas/splenic laceration req splenectomy. Marland Kitchen Apical mural thrombus   a. 11/2015 Echo: EF 25-30%, moderate size apical thrombus-->coumadin;  b. 12/2015 f/u Echo: EF 25-30%, ant/septa HK, no obvious large thrombus but cannot exclude small mural thrombus. Marland Kitchen CAD (coronary artery disease)   a. 12/2014 s/p BMS to the RCA. Marland Kitchen Cerebral infarction (HCC)  . Chronic systolic CHF (congestive heart failure) (HCC)   a. 12/2015 Echo: EF 25-30%. . Dementia  . Depression  . Dysphagia  . Falls   a. 11/2015 traumatic fall with resultant trauma req splenectomy and prolonged hospitalization complicated by resp failure. Marland Kitchen History of pneumonia  . Hyperlipidemia  . Hypertension  . Ischemic cardiomyopathy   a. 12/2015 Echo: EF 25-30%, anterior and septal HK. Marland Kitchen Left hemiplegia (HCC)  .  Protein calorie malnutrition (HCC)  . Respiratory failure (HCC)  . Spleen injury  . Status post tracheostomy (HCC)   a. 01/2016 in setting of ongoing resp failure and aspiration. Past Surgical History: Past Surgical History: Procedure Laterality Date . CARDIAC CATHETERIZATION   . CORONARY ANGIOPLASTY WITH STENT PLACEMENT   . IR GENERIC HISTORICAL  07/22/2016  IR GASTROSTOMY TUBE REMOVAL 07/22/2016 [name], MD [facility]-INTERV RAD . PR LAP, SPLENECTOMY  01/07/2016 . TRACHEOSTOMY TUBE PLACEMENT N/A 02/22/2016  Procedure: TRACHEOSTOMY;  Surgeon: [name], MD;  Location: [facility] OR;  Service: ENT;  Laterality: N/A; HPI: Pt. with PMH of HTN, chronic systolic CHF, chronic respiratory failure on 3L of oxygen, diabetes mellitus, recurrent fall, chronic anticoagulation, dyslipidemia; admitted on 11/26/2016, with complaint of A mechanical fall, was found to have closed left hip fracture.Patient developed aspiration pneumonia in the hospital. Currently surgery is on hold until pneumonia is better. Per MD pt was on dys 2/nectar thick liquids at SNF, but has been on thin liquids here. Pt had an MBS during admission to [facility] in 2017, but record not available. Pt also had a trach at that time. Reproted has a history of dysphagia due to prior CVAs.  No Data Recorded Assessment / Plan / Recommendation CHL IP CLINICAL IMPRESSIONS 11/30/2016 Therapy Diagnosis Severe pharyngeal phase dysphagia Clinical Impression Pt demonstrates a severe oropharygneal dysphagia with decreased duration and magnitude of hyolaryngeal excursion leading to severe oropharyngeal residuals that are aspirated with and without sensation in further swallow attempts. Base of tongue propulsion of bolus is also poor and epiglottis does not deflect for airway protection. Thin liquids pose a severe risk of aspiration and nectar thick liquids reduce severity of aspiration events, but are still likely to be aspirated due to increased residuals with thick  viscosity. A chin tuck significantly improves bolus transit if fully tucked against chest. Would suspect that carry over for this will be poor, but will trial dys 3 solids and nectar thick liquids with full supervision for a chin tuck  to determine if it is achievable.  Impact on safety and function Severe aspiration risk   CHL IP TREATMENT RECOMMENDATION 11/30/2016 Treatment Recommendations Therapy as outlined in treatment plan below   Prognosis 11/30/2016 Prognosis for Safe Diet Advancement Guarded Barriers to Reach Goals Severity of deficits;Time post onset Barriers/Prognosis Comment -- CHL IP DIET RECOMMENDATION 11/30/2016 SLP Diet Recommendations Dysphagia 3 (Mech soft) solids;Nectar thick liquid Liquid Administration via Cup;Straw Medication Administration Whole meds with puree Compensations Chin tuck;Slow rate;Small sips/bites;Minimize environmental distractions Postural Changes Remain semi-upright after after feeds/meals (Comment);Seated upright at 90 degrees   CHL IP OTHER RECOMMENDATIONS 11/30/2016 Recommended Consults -- Oral Care Recommendations Oral care BID Other Recommendations Order thickener from pharmacy   CHL IP FOLLOW UP RECOMMENDATIONS 11/30/2016 Follow up Recommendations Skilled Nursing facility   [facility] IP FREQUENCY AND DURATION 11/30/2016 Speech Therapy Frequency (ACUTE ONLY) min 2x/week Treatment Duration 2 weeks      CHL IP ORAL PHASE 11/30/2016 Oral Phase WFL Oral - Pudding Teaspoon -- Oral - Pudding Cup -- Oral - Honey Teaspoon -- Oral - Honey Cup -- Oral - Nectar Teaspoon -- Oral - Nectar Cup -- Oral - Nectar Straw -- Oral - Thin Teaspoon -- Oral - Thin Cup -- Oral - Thin Straw -- Oral - Puree -- Oral - Mech Soft -- Oral - Regular -- Oral - Multi-Consistency -- Oral - Pill -- Oral Phase - Comment --  CHL IP PHARYNGEAL PHASE 11/30/2016 Pharyngeal Phase Impaired Pharyngeal- Pudding Teaspoon -- Pharyngeal -- Pharyngeal- Pudding Cup -- Pharyngeal -- Pharyngeal- Honey Teaspoon -- Pharyngeal -- Pharyngeal- Honey  Cup -- Pharyngeal -- Pharyngeal- Nectar Teaspoon -- Pharyngeal -- Pharyngeal- Nectar Cup Reduced epiglottic inversion;Reduced anterior laryngeal mobility;Reduced airway/laryngeal closure;Reduced tongue base retraction;Delayed swallow initiation-pyriform sinuses;Pharyngeal residue - valleculae;Pharyngeal residue - pyriform;Compensatory strategies attempted (with notebox) Pharyngeal -- Pharyngeal- Nectar Straw Reduced epiglottic inversion;Reduced anterior laryngeal mobility;Reduced airway/laryngeal closure;Reduced tongue base retraction;Delayed swallow initiation-pyriform sinuses;Pharyngeal residue - valleculae;Pharyngeal residue - pyriform;Compensatory strategies attempted (with notebox);Other (Comment) Pharyngeal -- Pharyngeal- Thin Teaspoon -- Pharyngeal -- Pharyngeal- Thin Cup Reduced epiglottic inversion;Reduced anterior laryngeal mobility;Reduced airway/laryngeal closure;Reduced tongue base retraction;Delayed swallow initiation-pyriform sinuses;Pharyngeal residue - valleculae;Pharyngeal residue - pyriform;Penetration/Apiration after swallow;Significant aspiration (Amount) Pharyngeal Material enters airway, passes BELOW cords and not ejected out despite cough attempt by patient Pharyngeal- Thin Straw Reduced epiglottic inversion;Reduced anterior laryngeal mobility;Reduced airway/laryngeal closure;Reduced tongue base retraction;Delayed swallow initiation-pyriform sinuses;Pharyngeal residue - valleculae;Pharyngeal residue - pyriform;Compensatory strategies attempted (with notebox);Penetration/Aspiration during swallow Pharyngeal Material enters airway, passes BELOW cords without attempt by patient to eject out (silent aspiration) Pharyngeal- Puree Reduced epiglottic inversion;Reduced anterior laryngeal mobility;Reduced airway/laryngeal closure;Reduced tongue base retraction;Pharyngeal residue - valleculae;Pharyngeal residue - pyriform;Compensatory strategies attempted (with notebox);Delayed swallow  initiation-vallecula Pharyngeal -- Pharyngeal- Mechanical Soft Reduced epiglottic inversion;Reduced anterior laryngeal mobility;Reduced airway/laryngeal closure;Reduced tongue base retraction;Pharyngeal residue - valleculae;Pharyngeal residue - pyriform;Compensatory strategies attempted (with notebox);Delayed swallow initiation-vallecula Pharyngeal -- Pharyngeal- Regular -- Pharyngeal -- Pharyngeal- Multi-consistency -- Pharyngeal -- Pharyngeal- Pill Reduced epiglottic inversion;Reduced anterior laryngeal mobility;Reduced airway/laryngeal closure;Reduced tongue base retraction;Pharyngeal residue - valleculae;Pharyngeal residue - pyriform;Compensatory strategies attempted (with notebox);Delayed swallow initiation-vallecula Pharyngeal -- Pharyngeal Comment --  No flowsheet data found. [facility] IP GO 11/29/2016 Functional Assessment Tool Used clinical judgement Functional Limitations Swallowing Swallow Current Status [phone]) CJ Swallow Goal Status (Z3664) CJ Swallow Discharge Status (Q0347) (None) Motor Speech Current Status [phone]) (None) Motor Speech Goal Status (G3875) (None) Motor Speech Goal Status (I4332) (None) Spoken Language Comprehension Current Status (R5188) (None) Spoken Language Comprehension Goal Status (C1660) (None) Spoken Language Comprehension Discharge Status (  Z6109) (None) Spoken Language Expression Current Status [phone]) (None) Spoken Language Expression Goal Status [phone]) (None) Spoken Language Expression Discharge Status [phone]) (None) Attention Current Status [phone]) (None) Attention Goal Status (Z3086) (None) Attention Discharge Status [phone]) (None) Memory Current Status (N6295) (None) Memory Goal Status (M8413) (None) Memory Discharge Status (K4401) (None) Voice Current Status (U2725) (None) Voice Goal Status (D6644) (None) Voice Discharge Status (I3474) (None) Other Speech-Language Pathology Functional Limitation [phone]) (None) Other Speech-Language Pathology Functional Limitation Goal Status  (L8756) (None) Other Speech-Language Pathology Functional Limitation Discharge Status [phone]) (None) Harlon Ditty, MA CCC-SLP [phone] [place], Riley Nearing 11/30/2016, 2:50 PM              Dg Femur Min 2 Views Left  Result Date: 11/27/2016 CLINICAL DATA:  Larey Seat while attempting to get out of bed unassisted today. EXAM: LEFT FEMUR 2 VIEWS COMPARISON:  None. FINDINGS: There is a subcapital left hip fracture. Remainder of the femur is intact. No dislocation. No bone lesion or bony destruction. IMPRESSION: Subcapital left hip fracture Electronically Signed   By: Ellery Plunk M.D.   On: 11/27/2016 01:13     LOS: 6 days   Jeoffrey Massed, MD  [facility] Pager:[phone]  If 7PM-7AM, please contact night-coverage [email or web address] Password [email or web address] 12/03/2016, 9:08 AM

## 2016-12-03 NOTE — Progress Notes (Addendum)
Subjective: L femoral neck (subcapital) fx Awaiting surgery  No new c/o this AM. Received heparin at 749 this AM. Next dose due 1400. Ate breakfast this AM   Objective: Vital signs in last 24 hours: Temp:  [98.1 F (36.7 C)-99.5 F (37.5 C)] 98.3 F (36.8 C) (02/10 0747) Pulse Rate:  [61-117] 99 (02/10 0747) Resp:  [19-26] 24 (02/10 0747) BP: (104-157)/(53-108) 157/108 (02/10 0747) SpO2:  [92 %-98 %] 94 % (02/10 0747)  Intake/Output from previous day: 02/09 0701 - 02/10 0700 In: 100 [IV Piggyback:100] Out: 1575 [Urine:1575] Intake/Output this shift: Total I/O In: 50 [IV Piggyback:50] Out: -    Recent Labs  12/01/16 0224 12/02/16 0342 12/03/16 0320  HGB 14.0 15.4 16.3    Recent Labs  12/02/16 0342 12/03/16 0320  WBC 16.9* 16.9*  RBC 4.62 4.90  HCT 44.7 48.3  PLT 202 209    Recent Labs  12/02/16 0342 12/03/16 0320  NA 142 147*  K 4.9 4.2  CL 99* 101  CO2 30 31  BUN 61* 68*  CREATININE 1.97* 2.11*  GLUCOSE 175* 179*  CALCIUM 9.6 9.5    Recent Labs  12/01/16 0224  INR 1.20    Neurologically intact ABD soft Neurovascular intact Sensation intact distally Intact pulses distally Dorsiflexion/Plantar flexion intact No cellulitis present Compartment soft  L leg shortened and externally rotated No evidence of DVT  Assessment/Plan: L subcap hip fx  Per cardiology notes yesterday appears pt is cleared to proceed He is still being anticoagulated with ASA and heparin. Last dose heparin 749 this AM and due again at 1400 Dr. Shelle Iron will discuss with Dr. Charlann Boxer regarding timing for OR   Curtis Keller M. 12/03/2016, 9:19 AM    Dr. Shelle Iron discussed with Dr. Charlann Boxer Plan for surgery Monday Will keep NPO after MN Sunday Will need heparin adjusted depending on timing of surgery

## 2016-12-03 NOTE — Progress Notes (Signed)
   Previous note reviewed from Dr. Tresa Endo. Moderate risk for cardiac complications for perioperative hip surgery.  Dr. Jerral Ralph of hospitalist team currently managing.  Please let us know if we can be of further assistance. Will sign off.   Donato Schultz, MD

## 2016-12-04 ENCOUNTER — Inpatient Hospital Stay (HOSPITAL_COMMUNITY): Payer: Medicare Other

## 2016-12-04 DIAGNOSIS — N179 Acute kidney failure, unspecified: Secondary | ICD-10-CM

## 2016-12-04 DIAGNOSIS — E870 Hyperosmolality and hypernatremia: Secondary | ICD-10-CM

## 2016-12-04 LAB — BASIC METABOLIC PANEL
Anion gap: 13 (ref 5–15)
BUN: 86 mg/dL — AB (ref 6–20)
CALCIUM: 9.7 mg/dL (ref 8.9–10.3)
CO2: 31 mmol/L (ref 22–32)
CREATININE: 2.43 mg/dL — AB (ref 0.61–1.24)
Chloride: 106 mmol/L (ref 101–111)
GFR calc Af Amer: 31 mL/min — ABNORMAL LOW (ref >60)
GFR calc non Af Amer: 27 mL/min — ABNORMAL LOW (ref >60)
Glucose, Bld: 151 mg/dL — ABNORMAL HIGH (ref 65–99)
Potassium: 4.3 mmol/L (ref 3.5–5.1)
Sodium: 150 mmol/L — ABNORMAL HIGH (ref 135–145)

## 2016-12-04 LAB — GLUCOSE, CAPILLARY
Glucose-Capillary: 149 mg/dL — ABNORMAL HIGH (ref 65–99)
Glucose-Capillary: 238 mg/dL — ABNORMAL HIGH (ref 65–99)
Glucose-Capillary: 134 mg/dL — ABNORMAL HIGH (ref 65–99)
Glucose-Capillary: 188 mg/dL — ABNORMAL HIGH (ref 65–99)

## 2016-12-04 LAB — CBC
HEMATOCRIT: 50 % (ref 39.0–52.0)
Hemoglobin: 16.7 g/dL (ref 13.0–17.0)
MCH: 33.3 pg (ref 26.0–34.0)
MCHC: 33.4 g/dL (ref 30.0–36.0)
MCV: 99.6 fL (ref 78.0–100.0)
Platelets: 235 10*3/uL (ref 150–400)
RBC: 5.02 MIL/uL (ref 4.22–5.81)
RDW: 15.9 % — AB (ref 11.5–15.5)
WBC: 21.6 10*3/uL — ABNORMAL HIGH (ref 4.0–10.5)

## 2016-12-04 MED ORDER — SODIUM CHLORIDE 0.45 % IV SOLN
INTRAVENOUS | Status: AC
  Administered 2016-12-04: 09:00:00 via INTRAVENOUS

## 2016-12-04 NOTE — Progress Notes (Signed)
   Subjective:  C/o hip pain  Objective:   VITALS:   Vitals:   12/04/16 0620 12/04/16 0700 12/04/16 0747 12/04/16 0749  BP:    (!) 161/109  Pulse: 86 95  86  Resp: 20 16  17   Temp:    98 F (36.7 C)  TempSrc:    Oral  SpO2: 93% 90% 97% 96%  Weight:  104.7 kg (230 lb 13.2 oz)    Height:       NAD Pain with logroll of hip Skin intact NVI  Lab Results  Component Value Date   WBC 21.6 (H) 12/04/2016   HGB 16.7 12/04/2016   HCT 50.0 12/04/2016   MCV 99.6 12/04/2016   PLT 235 12/04/2016   BMET    Component Value Date/Time   NA 150 (H) 12/04/2016 0236   NA 137 03/15/2016   K 4.3 12/04/2016 0236   CL 106 12/04/2016 0236   CO2 31 12/04/2016 0236   GLUCOSE 151 (H) 12/04/2016 0236   BUN 86 (H) 12/04/2016 0236   BUN 18 03/15/2016   CREATININE 2.43 (H) 12/04/2016 0236   CALCIUM 9.7 12/04/2016 0236   GFRNONAA 27 (L) 12/04/2016 0236   GFRAA 31 (L) 12/04/2016 0236     Assessment/Plan:     Principal Problem:   Closed nondisplaced intertrochanteric fracture of left femur (HCC) Active Problems:   Heart failure, chronic systolic (HCC)   Hypertension   Protein calorie malnutrition (HCC)   S/P splenectomy   Aspiration pneumonia (HCC)   Hyperlipidemia   Chronic anticoagulation   COPD (chronic obstructive pulmonary disease) (HCC)   GERD (gastroesophageal reflux disease)   Diabetes mellitus, type II, insulin dependent (HCC)   Fall at nursing home   Acute and chronic respiratory failure with hypoxia (HCC)   Acute systolic congestive heart failure (HCC)   Ischemic cardiomyopathy   Goals of care, counseling/discussion   Palliative care encounter   Dr. Charlann Boxer plans for surgery tomorrow NPO after MN tonight Hold heparin after MN tonight   Trayon Krantz, Cloyde Reams 12/04/2016, 8:04 AM   Samson Frederic, MD Cell [phone]

## 2016-12-04 NOTE — Progress Notes (Addendum)
PROGRESS NOTE        PATIENT DETAILS Name: Curtis Keller Age: 61 y.o. Sex: male Date of Birth: 1956-07-06 Admit Date: 11/26/2016 Admitting Physician Clydie Braun, MD ZOX:[id number], Maxine Glenn, DO  Brief Narrative: Patient is a 61 y.o. male with history chronic systolic heart failure (EF 25-30%), history of LV thrombus, prior CVA with chronic left-sided hemiplegia-multiple falls over the past few years causing splenic rupture-presented to the hospital on 2/3 following a mechanical fall, found to have a left hip fracture. Unfortunately hospital course has been complicated by development of acute hypoxic respiratory failure, felt due to aspiration PNA vs acute systolic CHF.   Subjective: Lying comfortably in bed-denies SOB. On 3 L of O2 via Hall  Assessment/Plan: Closed Left hip fracture: Secondary to a mechanical fall-unfortunately before hip repair could be attempted-patient developed hypoxic resp failure. Thankfully with Abx/IV Lasix-his O2 requirements are coming down-now on 3 L of oxygen. Although improved, given frailty and overall situation-he will remain at risk for ventilator dependence post operatively.Spoke with Dr. Charlann Boxer yesterday, tentatively planning on hip repair on 2/12. Per cardiology will be at moderate risk for cardiac complications perioperatively.  Acute hypoxemic respiratory failure secondary to aspiration pneumonia and acute on chronic systolic heart failure:Improving with decreasing O2 requirements-after initiation of Lasix/Abx. CXR with some improvement on 2/9.Patienty had a aspiration event on 2/5-subsequently moved to stepdown and started on empiric Vanco/Zosyn. Since blood  negative, Vanco has been discontinued, he remains of Zosyn with stop date of 2/11.Since creatinine continues to increase today, Lasix remains on holdx. Weight down to 230 lb,-8.2 L so far. He has a broken hip-and requires surgical intervention-and will be at risk of ventilator  dependence post operatively. Steroid being tapered off-as unlikely to have COPD exac  Acute kidney injury: Continues to worsen-lasix remains on hold. Suspect prerenal azotemia-hemodynamic mediated injury. Given hypernatremia-will gently hydrate with 0.45 NS for a few hours today. Repeat Lytes in am  Hypernatremia: probably due to decrease in oral intake and diuretic use-difficult situation-since on exam volume status remains on dry side slightly-will cautiously hydrate with 0.45 NS for a few hours. Does not tolerate thin liquids due to aspiration risk  Dysphagia: Speech therapy following, underwent modified barium swallow-this is a chronic issue-patient has a prior history of CVA-he previously had a tube in place. Recommendations are to try a dysphagia 3 solids with nectar thick liquids. Spoke with patient's sister over the phone on 2/10, she is aware of the aspiration risk and wishes to continue with Dys 3 diet with nectar thick liquid.  History of left apical thrombus: Previously on Coumadin-currently on hold for surgery. Given high risk for venous thromboembolism post surgery, suspect will require short-term anticoagulation. Long discussion with patient's sister at bedside on 2/7, she would prefer short-term anticoagulation and is willing to accept all risks.  Leukocytosis: Suspect secondary to steroids and pneumonia-slowly decreasing-continue to follow periodically.  Hx of COPD: not in exacerbation-see above.   History of CVA with chronic left sided weakness: Previously on anticoagulation-left-sided deficits are unchanged. Continue statin. See above regarding anticoagulation  GERD: Continue Pepcid  Type 2 diabetes: CBGs stable-continue SSI  History of anxiety/depression/mild dementia: Continue Depakote, fluoxetine and BuSpar.  Frequent falls: Has had frequent falls in the past few years-one episode of fall resulted in a splenic laceration requiring splenectomy. Unfortunately, patient also  has a history  of LV thrombus and a CVA with left hemiplegia. Although he is a poor long term candidate, he is at significant risk of having venous thromboembolism post-hip surgery. Long discussion with the patient's sister on 2/7(who is a nurse at bedside)-she is agreeable to restart anticoagulation post operatively for the short-term.  Palliative care: Remains a full code, poor long-term prognosis-multiple medical comorbidities. Long discussion with the patient's sister, appreciate palliative care input.   DVT Prophylaxis: Prophylactic Heparin   Code Status: Full code   Family Communication: None at bedside-will reach out to patient's sister later today  Addendum 2:05 pm: updated sister over the phone-extensive d/w her regarding high surgical risk-ventilator dependence post-operatively-worsening renal function.  Disposition Plan: Remain inpatient-remain in SDU-SNF on discharge  Antimicrobial agents: Anti-infectives    Start     Dose/Rate Route Frequency Ordered Stop   11/28/16 1530  piperacillin-tazobactam (ZOSYN) IVPB 3.375 g     3.375 g 12.5 mL/hr over 240 Minutes Intravenous Every 8 hours 11/28/16 1518 12/04/16 2359   11/28/16 1530  vancomycin (VANCOCIN) IVPB 750 mg/150 ml premix  Status:  Discontinued     750 mg 150 mL/hr over 60 Minutes Intravenous Every 12 hours 11/28/16 1518 12/01/16 1031   11/28/16 1520  ceFAZolin (ANCEF) IVPB 2g/100 mL premix  Status:  Discontinued     2 g 200 mL/hr over 30 Minutes Intravenous To [facility]Stay [facility] 11/28/16 1247 11/28/16 2334   11/28/16 1000  piperacillin-tazobactam (ZOSYN) IVPB 3.375 g  Status:  Discontinued     3.375 g 12.5 mL/hr over 240 Minutes Intravenous Every 8 hours 11/28/16 0832 11/28/16 1518   11/28/16 1000  vancomycin (VANCOCIN) IVPB 750 mg/150 ml premix  Status:  Discontinued     750 mg 150 mL/hr over 60 Minutes Intravenous Every 12 hours 11/28/16 0832 11/28/16 1518      Procedures: Echo 2/5 - Very limited image quality,  however LVEF is severely impaired,   estimated at 25-30% with akinesis of all of the apical segments   and mid anteroseptal, anterior and anterolateral segments.   There is no thrombus on the contrast images.   A cardiac MRI is recommended for further LVEF evaluation.   CONSULTS:  cardiology and orthopedic surgery  Time spent: 25- minutes-Greater than 50% of this time was spent in counseling, explanation of diagnosis, planning of further management, and coordination of care.  MEDICATIONS: Scheduled Meds: . aspirin EC  81 mg Oral Daily  . atorvastatin  40 mg Oral QHS  . busPIRone  10 mg Oral BID  . divalproex  125 mg Oral TID  . famotidine  20 mg Oral BID  . feeding supplement (ENSURE ENLIVE)  237 mL Oral BID BM  . FLUoxetine  60 mg Oral Daily  . heparin subcutaneous  5,000 Units Subcutaneous Q8H  . hydrALAZINE  12.5 mg Oral q12n4p  . insulin aspart  0-9 Units Subcutaneous TID WC  . ipratropium-albuterol  3 mL Nebulization TID  . mouth rinse  15 mL Mouth Rinse BID  . metoprolol succinate  25 mg Oral Daily  . nicotine  21 mg Transdermal Daily  . piperacillin-tazobactam (ZOSYN)  IV  3.375 g Intravenous Q8H  . polyethylene glycol  17 g Oral Daily  . potassium chloride  20 mEq Oral Daily  . predniSONE  10 mg Oral Q breakfast  . pregabalin  75 mg Oral Daily  . senna-docusate  1 tablet Oral BID   Continuous Infusions: . sodium chloride    . sodium chloride 10 mL/hr  at 12/01/16 1949   PRN Meds:.acetaminophen, fentaNYL (SUBLIMAZE) injection, haloperidol lactate, methocarbamol, oxyCODONE-acetaminophen, RESOURCE THICKENUP CLEAR   PHYSICAL EXAM: Vital signs: Vitals:   12/04/16 0620 12/04/16 0700 12/04/16 0747 12/04/16 0749  BP:    (!) 161/109  Pulse: 86 95  86  Resp: 20 16  17   Temp:    98 F (36.7 C)  TempSrc:    Oral  SpO2: 93% 90% 97% 96%  Weight:  104.7 kg (230 lb 13.2 oz)    Height:       Filed Weights   11/30/16 0500 12/04/16 0700  Weight: 115.1 kg (253 lb 12 oz)  104.7 kg (230 lb 13.2 oz)   Body mass index is 29.64 kg/m.   General appearance :Awake, alert, not in any distress. Speech Clear. Eyes:, pupils equally reactive to light and accomodation,no scleral icterus. HEENT: Atraumatic and Normocephalic Neck: supple, no JVD. No cervical lymphadenopathy. No thyromegaly Resp:Good air entry bilaterally, clear to auscultation anteriorly CVS: S1 S2 regular GI: Bowel sounds present, Non tender and not distended with no gaurding, rigidity or rebound.No organomegaly Extremities: B/L Lower Ext shows no edema, both legs are warm to touch Neurology: Left hemiplegia Musculoskeletal:No digital cyanosis Skin:No Rash, warm and dry Wounds:N/A  I have personally reviewed following labs and imaging studies  LABORATORY DATA: CBC:  Recent Labs Lab 11/28/16 0838 11/29/16 0357 11/30/16 0523 12/01/16 0224 12/02/16 0342 12/03/16 0320 12/04/16 0236  WBC 14.0* 14.0* 23.4* 18.0* 16.9* 16.9* 21.6*  NEUTROABS 8.4* 12.4*  --   --   --   --   --   HGB 15.6 14.6 13.5 14.0 15.4 16.3 16.7  HCT 44.9 43.3 40.0 40.4 44.7 48.3 50.0  MCV 97.6 98.0 96.4 95.5 96.8 98.6 99.6  PLT 131* 152 169 178 202 209 235    Basic Metabolic Panel:  Recent Labs Lab 11/28/16 1012 11/29/16 0357 11/30/16 0523 12/01/16 0224 12/02/16 0342 12/03/16 0320 12/04/16 0236  NA  --  136 139 141 142 147* 150*  K  --  4.1 3.9 4.0 4.9 4.2 4.3  CL  --  101 101 101 99* 101 106  CO2  --  23 24 27 30 31 31   GLUCOSE  --  212* 193* 139* 175* 179* 151*  BUN  --  32* 43* 49* 61* 68* 86*  CREATININE  --  1.82* 1.83* 1.57* 1.97* 2.11* 2.43*  CALCIUM  --  9.0 9.2 9.4 9.6 9.5 9.7  MG 1.8 1.8 2.0  --   --   --   --     GFR: Estimated Creatinine Clearance: 41.2 mL/min (by C-G formula based on SCr of 2.43 mg/dL (H)).  Liver Function Tests:  Recent Labs Lab 11/28/16 0838 11/29/16 0357  AST 22 27  ALT 18 17  ALKPHOS 70 61  BILITOT 0.5 0.9  PROT 7.0 7.1  ALBUMIN 2.6* 2.4*   No results for  input(s): LIPASE, AMYLASE in the last 168 hours. No results for input(s): AMMONIA in the last 168 hours.  Coagulation Profile:  Recent Labs Lab 11/28/16 1605 11/29/16 0357 11/30/16 0523 12/01/16 0224  INR 2.49 2.46 1.31 1.20    Cardiac Enzymes:  Recent Labs Lab 11/27/16 1557 11/27/16 2022  TROPONINI 0.03* 0.05*    BNP (last 3 results) No results for input(s): PROBNP in the last 8760 hours.  HbA1C: No results for input(s): HGBA1C in the last 72 hours.  CBG:  Recent Labs Lab 12/03/16 0751 12/03/16 1143 12/03/16 1619 12/03/16 2159 12/04/16 1610  GLUCAP 180* 209* 190* 200* 149*    Lipid Profile: No results for input(s): CHOL, HDL, LDLCALC, TRIG, CHOLHDL, LDLDIRECT in the last 72 hours.  Thyroid Function Tests: No results for input(s): TSH, T4TOTAL, FREET4, T3FREE, THYROIDAB in the last 72 hours.  Anemia Panel: No results for input(s): VITAMINB12, FOLATE, FERRITIN, TIBC, IRON, RETICCTPCT in the last 72 hours.  Urine analysis:    Component Value Date/Time   COLORURINE YELLOW 11/27/2016 0355   APPEARANCEUR CLEAR 11/27/2016 0355   LABSPEC 1.015 11/27/2016 0355   PHURINE 6.0 11/27/2016 0355   GLUCOSEU NEGATIVE 11/27/2016 0355   HGBUR LARGE (A) 11/27/2016 0355   BILIRUBINUR NEGATIVE 11/27/2016 0355   KETONESUR NEGATIVE 11/27/2016 0355   PROTEINUR 100 (A) 11/27/2016 0355   NITRITE NEGATIVE 11/27/2016 0355   LEUKOCYTESUR NEGATIVE 11/27/2016 0355    Sepsis Labs: Lactic Acid, Venous    Component Value Date/Time   LATICACIDVEN 1.9 11/28/2016 2023    MICROBIOLOGY: Recent Results (from the past 240 hour(s))  Surgical pcr screen     Status: Abnormal   Collection Time: 11/27/16  3:58 PM  Result Value Ref Range Status   MRSA, PCR POSITIVE (A) NEGATIVE Final   Staphylococcus aureus POSITIVE (A) NEGATIVE Final    Comment:        The Xpert SA Assay (FDA approved for NASAL specimens in patients over 8 years of age), is one component of a comprehensive  surveillance program.  Test performance has been validated by [facility] for patients greater than or equal to 11 year old. It is not intended to diagnose infection nor to guide or monitor treatment. RESULT CALLED TO, READ BACK BY AND VERIFIED WITH: C FOUSHEE,RN AT 1818 11/27/16 BY L BENFIELD   Culture, blood (routine x 2)     Status: None   Collection Time: 11/27/16  9:52 PM  Result Value Ref Range Status   Specimen Description BLOOD RIGHT HAND  Final   Special Requests BOTTLES DRAWN AEROBIC AND ANAEROBIC  5CC EA  Final   Culture NO GROWTH 5 DAYS  Final   Report Status 12/02/2016 FINAL  Final  Culture, blood (routine x 2)     Status: None   Collection Time: 11/27/16  9:52 PM  Result Value Ref Range Status   Specimen Description BLOOD RIGHT HAND  Final   Special Requests BOTTLES DRAWN AEROBIC AND ANAEROBIC  5CC EA  Final   Culture NO GROWTH 5 DAYS  Final   Report Status 12/03/2016 FINAL  Final    RADIOLOGY STUDIES/RESULTS: Dg Chest 1 View  Result Date: 11/27/2016 CLINICAL DATA:  Larey Seat today.  Left hip fracture. EXAM: CHEST 1 VIEW COMPARISON:  02/24/2016 FINDINGS: Diffuse interstitial coarsening is probably chronic. No consolidation. No effusion. No pneumothorax. New unchanged hilar, mediastinal and cardiac contours. IMPRESSION: Chronic appearing interstitial coarsening. No consolidation or effusion. Electronically Signed   By: Ellery Plunk M.D.   On: 11/27/2016 01:14   Dg Pelvis 1-2 Views  Result Date: 11/27/2016 CLINICAL DATA:  Larey Seat today while trying to get out of bed unassisted. EXAM: PELVIS - 1-2 VIEW COMPARISON:  None. FINDINGS: There is an acute subcapital left hip fracture. No dislocation. No bone lesion or bony destruction. Pelvis appears intact. IMPRESSION: Subcapital left hip fracture Electronically Signed   By: Ellery Plunk M.D.   On: 11/27/2016 01:13   Ct Head Wo Contrast  Result Date: 11/27/2016 CLINICAL DATA:  Larey Seat getting out of bed today, acute LEFT hip  fracture. History of hypertension, stroke. EXAM: CT  HEAD WITHOUT CONTRAST TECHNIQUE: Contiguous axial images were obtained from the base of the skull through the vertex without intravenous contrast. COMPARISON:  CT HEAD January 26, 2016 FINDINGS: BRAIN: No intraparenchymal hemorrhage, mass effect, midline shift or acute large vascular territory infarcts. Old RIGHT greater than LEFT cerebellar infarcts, severe brachium contrast atrophy. Old RIGHT greater than LEFT pontine infarcts. Old LEFT thalamus and bilateral basal ganglia lacunar infarcts. Old small RIGHT occipital lobe infarct. LEFT inferior temporal lobe encephalomalacia is likely posttraumatic. Mild to moderate white matter changes compatible with chronic small vessel ischemic disease. No abnormal extra-axial fluid collection. Moderate ventriculomegaly on the basis of global parenchymal brain volume loss. VASCULAR: Mild to moderate calcific atherosclerosis of the carotid siphons. SKULL: No skull fracture. No significant scalp soft tissue swelling. SINUSES/ORBITS: Bilateral maxillary mucosal retention cysts. Mastoid air cells are well aerated. The included ocular globes and orbital contents are non-suspicious. OTHER: Patient is edentulous. IMPRESSION: No acute intracranial process. Stable examination including numerous old supra- and infratentorial infarcts predominately within posterior circulation, moderate global brain atrophy. Electronically Signed   By: Awilda Metro M.D.   On: 11/27/2016 03:01   Dg Chest Port 1 View  Result Date: 12/04/2016 CLINICAL DATA:  Shortness of Breath EXAM: PORTABLE CHEST 1 VIEW COMPARISON:  12/03/2016 FINDINGS: Cardiomegaly. Mild interstitial prominence and vascular congestion. Question mild interstitial edema. No effusions or acute bony abnormality. IMPRESSION: Vascular congestion with interstitial prominence, question mild interstitial edema. Electronically Signed   By: Charlett Nose M.D.   On: 12/04/2016 07:11   Dg  Chest Port 1 View  Result Date: 12/03/2016 CLINICAL DATA:  Shortness of Breath EXAM: PORTABLE CHEST 1 VIEW COMPARISON:  12/02/2016 FINDINGS: Cardiac shadow is enlarged. Lungs are well aerated bilaterally. Vascular congestion is noted without significant interstitial edema. No sizable effusion is noted. No bony abnormality is seen. IMPRESSION: Stable vascular congestion. Electronically Signed   By: Alcide Clever M.D.   On: 12/03/2016 15:20   Dg Chest Port 1 View  Result Date: 12/02/2016 CLINICAL DATA:  Dyspnea EXAM: PORTABLE CHEST 1 VIEW COMPARISON:  Chest radiograph from one day prior. FINDINGS: Slightly right rotated chest radiograph. Stable cardiomediastinal silhouette with mild cardiomegaly. No pneumothorax. No pleural effusion. Mild pulmonary edema, decreased. IMPRESSION: Mild congestive heart failure, improved. Electronically Signed   By: Delbert Phenix M.D.   On: 12/02/2016 08:06   Dg Chest Port 1 View  Addendum Date: 12/01/2016   ADDENDUM REPORT: 12/01/2016 08:08 ADDENDUM: There is an old healed right clavicle fracture, stable. Electronically Signed   By: Bretta Bang III M.D.   On: 12/01/2016 08:08   Result Date: 12/01/2016 CLINICAL DATA:  Shortness of Breath EXAM: PORTABLE CHEST 1 VIEW COMPARISON:  November 30, 2016 FINDINGS: Extensive airspace consolidation bilaterally, most severe in the left upper to mid lung zones, up remains without significant change. There is cardiomegaly with pulmonary venous hypertension. No adenopathy evident. No bone lesions. IMPRESSION: Widespread airspace consolidation bilaterally, somewhat more on the left than on the right. Evidence a degree of underlying pulmonary vascular congestion. Question multifocal pneumonia versus pulmonary edema; both entities may well exist concurrently. The overall appearance of the lungs and cardiomediastinal silhouette is stable compared to 1 day prior. Electronically Signed: By: Bretta Bang III M.D. On: 12/01/2016 08:02   Dg  Chest Port 1 View  Result Date: 11/28/2016 CLINICAL DATA:  Shortness of breath. EXAM: PORTABLE CHEST 1 VIEW COMPARISON:  11/27/2016 and 02/24/2016 FINDINGS: The patient has progressive bilateral pulmonary infiltrates which may represent pulmonary edema  or progressive pneumonia. Pulmonary vascularity appears normal in the overall heart size is normal. IMPRESSION: Probable progressive bilateral pneumonia. Electronically Signed   By: Francene Boyers M.D.   On: 11/28/2016 15:31   Dg Chest Port 1 View  Result Date: 11/27/2016 CLINICAL DATA:  sob EXAM: PORTABLE CHEST 1 VIEW COMPARISON:  11/27/2016 FINDINGS: The heart is enlarged. There are airspace filling opacities bilaterally, particularly well seen at the left lung base. Patient is rotated. IMPRESSION: Bilateral airspace filling opacities consistent with pulmonary edema and/or infectious process. Electronically Signed   By: Norva Pavlov M.D.   On: 11/27/2016 22:17   Dg Chest Port 1v Same Day  Result Date: 11/30/2016 CLINICAL DATA:  Shortness of breath, cough today, pneumonia, history hypertension, prior respiratory failure, coronary disease, chronic systolic CHF, ischemic cardiomyopathy EXAM: PORTABLE CHEST 1 VIEW COMPARISON:  Portable exam 0922 hours compared to 11/28/2016 FINDINGS: Rotated to the LEFT with kyphotic positioning. Enlargement of cardiac silhouette. Stable mediastinal contours for degree of rotation. Extensive BILATERAL pulmonary infiltrates favor pneumonia over edema. No pleural effusion or pneumothorax. Bones unremarkable. IMPRESSION: Persistent BILATERAL pulmonary infiltrates question pneumonia. Enlargement of cardiac silhouette. Electronically Signed   By: Ulyses Southward M.D.   On: 11/30/2016 09:31   Dg Swallowing Func-speech Pathology  Result Date: 11/30/2016 Objective Swallowing Evaluation: Type of Study: MBS-Modified Barium Swallow Study Patient Details Name: Curtis Keller MRN: [id number] Date of Birth: 1956/06/08 Today's Date: 11/30/2016  Time: SLP Start Time (ACUTE ONLY): 1310-SLP Stop Time (ACUTE ONLY): 1325 SLP Time Calculation (min) (ACUTE ONLY): 15 min Past Medical History: Past Medical History: Diagnosis Date . Acute respiratory failure (HCC)   a. Spring 2017 following fall/splenectomy -->admitted to [facility]-->trach/g tube. . Anemia   a. 12/2015 ABL in setting of fall/hematomas/splenic laceration req splenectomy. Marland Kitchen Apical mural thrombus   a. 11/2015 Echo: EF 25-30%, moderate size apical thrombus-->coumadin;  b. 12/2015 f/u Echo: EF 25-30%, ant/septa HK, no obvious large thrombus but cannot exclude small mural thrombus. Marland Kitchen CAD (coronary artery disease)   a. 12/2014 s/p BMS to the RCA. Marland Kitchen Cerebral infarction (HCC)  . Chronic systolic CHF (congestive heart failure) (HCC)   a. 12/2015 Echo: EF 25-30%. . Dementia  . Depression  . Dysphagia  . Falls   a. 11/2015 traumatic fall with resultant trauma req splenectomy and prolonged hospitalization complicated by resp failure. Marland Kitchen History of pneumonia  . Hyperlipidemia  . Hypertension  . Ischemic cardiomyopathy   a. 12/2015 Echo: EF 25-30%, anterior and septal HK. Marland Kitchen Left hemiplegia (HCC)  . Protein calorie malnutrition (HCC)  . Respiratory failure (HCC)  . Spleen injury  . Status post tracheostomy (HCC)   a. 01/2016 in setting of ongoing resp failure and aspiration. Past Surgical History: Past Surgical History: Procedure Laterality Date . CARDIAC CATHETERIZATION   . CORONARY ANGIOPLASTY WITH STENT PLACEMENT   . IR GENERIC HISTORICAL  07/22/2016  IR GASTROSTOMY TUBE REMOVAL 07/22/2016 [name], MD [facility]-INTERV RAD . PR LAP, SPLENECTOMY  01/07/2016 . TRACHEOSTOMY TUBE PLACEMENT N/A 02/22/2016  Procedure: TRACHEOSTOMY;  Surgeon: [name], MD;  Location: [facility] OR;  Service: ENT;  Laterality: N/A; HPI: Pt. with PMH of HTN, chronic systolic CHF, chronic respiratory failure on 3L of oxygen, diabetes mellitus, recurrent fall, chronic anticoagulation, dyslipidemia; admitted on 11/26/2016, with complaint of A  mechanical fall, was found to have closed left hip fracture.Patient developed aspiration pneumonia in the hospital. Currently surgery is on hold until pneumonia is better. Per MD pt was on dys 2/nectar thick liquids  at SNF, but has been on thin liquids here. Pt had an MBS during admission to [facility] in 2017, but record not available. Pt also had a trach at that time. Reproted has a history of dysphagia due to prior CVAs.  No Data Recorded Assessment / Plan / Recommendation CHL IP CLINICAL IMPRESSIONS 11/30/2016 Therapy Diagnosis Severe pharyngeal phase dysphagia Clinical Impression Pt demonstrates a severe oropharygneal dysphagia with decreased duration and magnitude of hyolaryngeal excursion leading to severe oropharyngeal residuals that are aspirated with and without sensation in further swallow attempts. Base of tongue propulsion of bolus is also poor and epiglottis does not deflect for airway protection. Thin liquids pose a severe risk of aspiration and nectar thick liquids reduce severity of aspiration events, but are still likely to be aspirated due to increased residuals with thick viscosity. A chin tuck significantly improves bolus transit if fully tucked against chest. Would suspect that carry over for this will be poor, but will trial dys 3 solids and nectar thick liquids with full supervision for a chin tuck to determine if it is achievable.  Impact on safety and function Severe aspiration risk   CHL IP TREATMENT RECOMMENDATION 11/30/2016 Treatment Recommendations Therapy as outlined in treatment plan below   Prognosis 11/30/2016 Prognosis for Safe Diet Advancement Guarded Barriers to Reach Goals Severity of deficits;Time post onset Barriers/Prognosis Comment -- CHL IP DIET RECOMMENDATION 11/30/2016 SLP Diet Recommendations Dysphagia 3 (Mech soft) solids;Nectar thick liquid Liquid Administration via Cup;Straw Medication Administration Whole meds with puree Compensations Chin tuck;Slow  rate;Small sips/bites;Minimize environmental distractions Postural Changes Remain semi-upright after after feeds/meals (Comment);Seated upright at 90 degrees   CHL IP OTHER RECOMMENDATIONS 11/30/2016 Recommended Consults -- Oral Care Recommendations Oral care BID Other Recommendations Order thickener from pharmacy   CHL IP FOLLOW UP RECOMMENDATIONS 11/30/2016 Follow up Recommendations Skilled Nursing facility   [facility] IP FREQUENCY AND DURATION 11/30/2016 Speech Therapy Frequency (ACUTE ONLY) min 2x/week Treatment Duration 2 weeks      CHL IP ORAL PHASE 11/30/2016 Oral Phase WFL Oral - Pudding Teaspoon -- Oral - Pudding Cup -- Oral - Honey Teaspoon -- Oral - Honey Cup -- Oral - Nectar Teaspoon -- Oral - Nectar Cup -- Oral - Nectar Straw -- Oral - Thin Teaspoon -- Oral - Thin Cup -- Oral - Thin Straw -- Oral - Puree -- Oral - Mech Soft -- Oral - Regular -- Oral - Multi-Consistency -- Oral - Pill -- Oral Phase - Comment --  [facility] IP PHARYNGEAL PHASE 11/30/2016 Pharyngeal Phase Impaired Pharyngeal- Pudding Teaspoon -- Pharyngeal -- Pharyngeal- Pudding Cup -- Pharyngeal -- Pharyngeal- Honey Teaspoon -- Pharyngeal -- Pharyngeal- Honey Cup -- Pharyngeal -- Pharyngeal- Nectar Teaspoon -- Pharyngeal -- Pharyngeal- Nectar Cup Reduced epiglottic inversion;Reduced anterior laryngeal mobility;Reduced airway/laryngeal closure;Reduced tongue base retraction;Delayed swallow initiation-pyriform sinuses;Pharyngeal residue - valleculae;Pharyngeal residue - pyriform;Compensatory strategies attempted (with notebox) Pharyngeal -- Pharyngeal- Nectar Straw Reduced epiglottic inversion;Reduced anterior laryngeal mobility;Reduced airway/laryngeal closure;Reduced tongue base retraction;Delayed swallow initiation-pyriform sinuses;Pharyngeal residue - valleculae;Pharyngeal residue - pyriform;Compensatory strategies attempted (with notebox);Other (Comment) Pharyngeal -- Pharyngeal- Thin Teaspoon -- Pharyngeal -- Pharyngeal- Thin Cup Reduced epiglottic  inversion;Reduced anterior laryngeal mobility;Reduced airway/laryngeal closure;Reduced tongue base retraction;Delayed swallow initiation-pyriform sinuses;Pharyngeal residue - valleculae;Pharyngeal residue - pyriform;Penetration/Apiration after swallow;Significant aspiration (Amount) Pharyngeal Material enters airway, passes BELOW cords and not ejected out despite cough attempt by patient Pharyngeal- Thin Straw Reduced epiglottic inversion;Reduced anterior laryngeal mobility;Reduced airway/laryngeal closure;Reduced tongue base retraction;Delayed swallow initiation-pyriform sinuses;Pharyngeal residue - valleculae;Pharyngeal residue - pyriform;Compensatory strategies attempted (with notebox);Penetration/Aspiration during  swallow Pharyngeal Material enters airway, passes BELOW cords without attempt by patient to eject out (silent aspiration) Pharyngeal- Puree Reduced epiglottic inversion;Reduced anterior laryngeal mobility;Reduced airway/laryngeal closure;Reduced tongue base retraction;Pharyngeal residue - valleculae;Pharyngeal residue - pyriform;Compensatory strategies attempted (with notebox);Delayed swallow initiation-vallecula Pharyngeal -- Pharyngeal- Mechanical Soft Reduced epiglottic inversion;Reduced anterior laryngeal mobility;Reduced airway/laryngeal closure;Reduced tongue base retraction;Pharyngeal residue - valleculae;Pharyngeal residue - pyriform;Compensatory strategies attempted (with notebox);Delayed swallow initiation-vallecula Pharyngeal -- Pharyngeal- Regular -- Pharyngeal -- Pharyngeal- Multi-consistency -- Pharyngeal -- Pharyngeal- Pill Reduced epiglottic inversion;Reduced anterior laryngeal mobility;Reduced airway/laryngeal closure;Reduced tongue base retraction;Pharyngeal residue - valleculae;Pharyngeal residue - pyriform;Compensatory strategies attempted (with notebox);Delayed swallow initiation-vallecula Pharyngeal -- Pharyngeal Comment --  No flowsheet data found. [facility] IP GO 11/29/2016 Functional  Assessment Tool Used clinical judgement Functional Limitations Swallowing Swallow Current Status [phone]) CJ Swallow Goal Status (S0630) CJ Swallow Discharge Status (Z6010) (None) Motor Speech Current Status [phone]) (None) Motor Speech Goal Status (T7322) (None) Motor Speech Goal Status (G2542) (None) Spoken Language Comprehension Current Status (H0623) (None) Spoken Language Comprehension Goal Status (J6283) (None) Spoken Language Comprehension Discharge Status [phone]) (None) Spoken Language Expression Current Status [phone]) (None) Spoken Language Expression Goal Status [phone]) (None) Spoken Language Expression Discharge Status [phone]) (None) Attention Current Status (I6270) (None) Attention Goal Status (J5009) (None) Attention Discharge Status [phone]) (None) Memory Current Status (X9371) (None) Memory Goal Status (I9678) (None) Memory Discharge Status (L3810) (None) Voice Current Status (F7510) (None) Voice Goal Status (C5852) (None) Voice Discharge Status (D7824) (None) Other Speech-Language Pathology Functional Limitation [phone]) (None) Other Speech-Language Pathology Functional Limitation Goal Status (R4431) (None) Other Speech-Language Pathology Functional Limitation Discharge Status [phone]) (None) Harlon Ditty, MA CCC-SLP [phone] [place], Riley Nearing 11/30/2016, 2:50 PM              Dg Femur Min 2 Views Left  Result Date: 11/27/2016 CLINICAL DATA:  Larey Seat while attempting to get out of bed unassisted today. EXAM: LEFT FEMUR 2 VIEWS COMPARISON:  None. FINDINGS: There is a subcapital left hip fracture. Remainder of the femur is intact. No dislocation. No bone lesion or bony destruction. IMPRESSION: Subcapital left hip fracture Electronically Signed   By: Ellery Plunk M.D.   On: 11/27/2016 01:13     LOS: 7 days   Jeoffrey Massed, MD  [facility] Pager:[phone]  If 7PM-7AM, please contact night-coverage [email or web address] Password [email or web address] 12/04/2016, 8:03 AM

## 2016-12-05 ENCOUNTER — Inpatient Hospital Stay (HOSPITAL_COMMUNITY): Payer: Medicare Other

## 2016-12-05 DIAGNOSIS — R1310 Dysphagia, unspecified: Secondary | ICD-10-CM

## 2016-12-05 LAB — BASIC METABOLIC PANEL
Anion gap: 11 (ref 5–15)
BUN: 87 mg/dL — ABNORMAL HIGH (ref 6–20)
CHLORIDE: 109 mmol/L (ref 101–111)
CO2: 31 mmol/L (ref 22–32)
CREATININE: 2.32 mg/dL — AB (ref 0.61–1.24)
Calcium: 9.5 mg/dL (ref 8.9–10.3)
GFR calc non Af Amer: 29 mL/min — ABNORMAL LOW (ref >60)
GFR, EST AFRICAN AMERICAN: 33 mL/min — AB (ref >60)
Glucose, Bld: 200 mg/dL — ABNORMAL HIGH (ref 65–99)
POTASSIUM: 4.3 mmol/L (ref 3.5–5.1)
SODIUM: 151 mmol/L — AB (ref 135–145)

## 2016-12-05 LAB — CBC
HEMATOCRIT: 49.4 % (ref 39.0–52.0)
HEMOGLOBIN: 16.4 g/dL (ref 13.0–17.0)
MCH: 33.4 pg (ref 26.0–34.0)
MCHC: 33.2 g/dL (ref 30.0–36.0)
MCV: 100.6 fL — ABNORMAL HIGH (ref 78.0–100.0)
Platelets: 242 10*3/uL (ref 150–400)
RBC: 4.91 MIL/uL (ref 4.22–5.81)
RDW: 16.1 % — ABNORMAL HIGH (ref 11.5–15.5)
WBC: 22.3 10*3/uL — ABNORMAL HIGH (ref 4.0–10.5)

## 2016-12-05 LAB — GLUCOSE, CAPILLARY
GLUCOSE-CAPILLARY: 158 mg/dL — AB (ref 65–99)
Glucose-Capillary: 271 mg/dL — ABNORMAL HIGH (ref 65–99)
Glucose-Capillary: 215 mg/dL — ABNORMAL HIGH (ref 65–99)
Glucose-Capillary: 244 mg/dL — ABNORMAL HIGH (ref 65–99)

## 2016-12-05 MED ORDER — DEXTROSE 5 % IV SOLN
INTRAVENOUS | Status: AC
  Administered 2016-12-05: 11:00:00 via INTRAVENOUS

## 2016-12-05 MED ORDER — HEPARIN SODIUM (PORCINE) 5000 UNIT/ML IJ SOLN
5000.0000 [IU] | Freq: Three times a day (TID) | INTRAMUSCULAR | Status: AC
  Administered 2016-12-05 – 2016-12-09 (×14): 5000 [IU] via SUBCUTANEOUS
  Filled 2016-12-05 (×12): qty 1

## 2016-12-05 NOTE — Progress Notes (Addendum)
amion                        PROGRESS NOTE        PATIENT DETAILS Name: Curtis Keller Age: 61 y.o. Sex: male Date of Birth: 14-Aug-1956 Admit Date: 11/26/2016 Admitting Physician Clydie Braun, MD ZOX:[id number], Maxine Glenn, DO  Brief Narrative: Patient is a 61 y.o. male with history chronic systolic heart failure (EF 25-30%), history of LV thrombus, prior CVA with chronic left-sided hemiplegia-multiple falls over the past few years causing splenic rupture-presented to the hospital on 2/3 following a mechanical fall, found to have a left hip fracture. Unfortunately hospital course has been complicated by development of acute hypoxic respiratory failure, felt due to aspiration PNA vs acute systolic CHF.   Subjective: Lying comfortably in bed-denies SOB.   Assessment/Plan: Closed Left hip fracture: Secondary to a mechanical fall-unfortunately before hip repair could be attempted-patient developed hypoxic resp failure. Thankfully with Abx/IV Lasix-his O2 requirements are coming down-now on 3 L of oxygen. Although improved, given frailty and overall situation-he will remain at risk for ventilator dependence post operatively. Per cardiology will be at moderate risk for cardiac complications perioperatively.Unfortunately, his improvement is somewhat plateaued, not sure if he can be further optimized and his current condition. Per cardiology will be at moderate risk for cardiac complications perioperatively.  Acute hypoxemic respiratory failure secondary to aspiration pneumonia and acute on chronic systolic heart failure:Improving with decreasing O2 requirements-after initiation of Lasix/Abx. CXR with some improvement on 2/9.Patienty had a aspiration event on 2/5-subsequently moved to stepdown and started on empiric Vanco/Zosyn. He has now completed a course of antimicrobial therapy-Zosyn discontinued on 2/10. Since creatinine worsened, Lasix remains on hold. His CHF remains compensated, he is currently  -8.7 L so far. He has a broken hip-and requires surgical intervention-and will be at risk of ventilator dependence post operatively. Steroid has not been discontinued following a brief taper.  Acute kidney injury: Creatinine now downtrending with the gentle hydration. Lasix remains on hold.Suspect prerenal azotemia-hemodynamic mediated injury. Given hypernatremia-will continue to gently hydrate with 0.45 NS for a few hours today. Repeat Lytes in am  Hypernatremia: probably due to decrease in oral intake and diuretic use-difficult situation-since on exam volume status remains on dry side slightly-will cautiously into new to hydrate with 0.45 NS for a few hours. Does not tolerate thin liquids due to aspiration risk  Dysphagia: Speech therapy following, underwent modified barium swallow-this is a chronic issue-patient has a prior history of CVA-he previously had a tube in place. Recommendations are to try a dysphagia 3 solids with nectar thick liquids. Spoke with patient's sister over the phone on 2/10, she is aware of the aspiration risk and wishes to continue with Dys 3 diet with nectar thick liquid.  History of left apical thrombus: Previously on Coumadin-currently on hold for surgery. Given high risk for venous thromboembolism post surgery, suspect will require short-term anticoagulation. Long discussion with patient's sister at bedside on 2/7, she would prefer short-term anticoagulation and is willing to accept all risks.  Leukocytosis: Suspect secondary to steroids and pneumonia-slowly decreasing-continue to follow periodically.  Hx of COPD: not in exacerbation-see above.   History of CVA with chronic left sided weakness: Previously on anticoagulation-left-sided deficits are unchanged. Continue statin. See above regarding anticoagulation  GERD: Continue Pepcid  Type 2 diabetes: CBGs stable-continue SSI  History of anxiety/depression/mild dementia: Continue Depakote, fluoxetine and  BuSpar.  Frequent falls: Has had frequent falls in the past few  years-one episode of fall resulted in a splenic laceration requiring splenectomy. Unfortunately, patient also has a history of LV thrombus and a CVA with left hemiplegia. Although he is a poor long term candidate, he is at significant risk of having venous thromboembolism post-hip surgery. Long discussion with the patient's sister on 2/7(who is a nurse at bedside)-she is agreeable to restart anticoagulation post operatively for the short-term.  Palliative care: Remains a full code, poor long-term prognosis-multiple medical comorbidities. Long discussion with the patient's sister, appreciate palliative care input.   DVT Prophylaxis: Prophylactic Heparin   Code Status: Full code   Family Communication: Extensive discussion with the patient's sister on 2/11 over the phone. Attempted to reach patient's daughter on 2/12-unable to leave a voicemail  Disposition Plan: Remain inpatient-remain in SDU-SNF on discharge  Antimicrobial agents: Anti-infectives    Start     Dose/Rate Route Frequency Ordered Stop   11/28/16 1530  piperacillin-tazobactam (ZOSYN) IVPB 3.375 g     3.375 g 12.5 mL/hr over 240 Minutes Intravenous Every 8 hours 11/28/16 1518 12/04/16 2359   11/28/16 1530  vancomycin (VANCOCIN) IVPB 750 mg/150 ml premix  Status:  Discontinued     750 mg 150 mL/hr over 60 Minutes Intravenous Every 12 hours 11/28/16 1518 12/01/16 1031   11/28/16 1520  ceFAZolin (ANCEF) IVPB 2g/100 mL premix  Status:  Discontinued     2 g 200 mL/hr over 30 Minutes Intravenous To ShortStay Surgical 11/28/16 1247 11/28/16 2334   11/28/16 1000  piperacillin-tazobactam (ZOSYN) IVPB 3.375 g  Status:  Discontinued     3.375 g 12.5 mL/hr over 240 Minutes Intravenous Every 8 hours 11/28/16 0832 11/28/16 1518   11/28/16 1000  vancomycin (VANCOCIN) IVPB 750 mg/150 ml premix  Status:  Discontinued     750 mg 150 mL/hr over 60 Minutes Intravenous Every 12  hours 11/28/16 7564 11/28/16 1518      Procedures: Echo 2/5 - Very limited image quality, however LVEF is severely impaired,   estimated at 25-30% with akinesis of all of the apical segments   and mid anteroseptal, anterior and anterolateral segments.   There is no thrombus on the contrast images.   A cardiac MRI is recommended for further LVEF evaluation.   CONSULTS:  cardiology and orthopedic surgery  Time spent: 25- minutes-Greater than 50% of this time was spent in counseling, explanation of diagnosis, planning of further management, and coordination of care.  MEDICATIONS: Scheduled Meds: . aspirin EC  81 mg Oral Daily  . atorvastatin  40 mg Oral QHS  . busPIRone  10 mg Oral BID  . divalproex  125 mg Oral TID  . famotidine  20 mg Oral BID  . feeding supplement (ENSURE ENLIVE)  237 mL Oral BID BM  . FLUoxetine  60 mg Oral Daily  . heparin subcutaneous  5,000 Units Subcutaneous Q8H  . hydrALAZINE  12.5 mg Oral q12n4p  . insulin aspart  0-9 Units Subcutaneous TID WC  . ipratropium-albuterol  3 mL Nebulization TID  . mouth rinse  15 mL Mouth Rinse BID  . metoprolol succinate  25 mg Oral Daily  . nicotine  21 mg Transdermal Daily  . polyethylene glycol  17 g Oral Daily  . potassium chloride  20 mEq Oral Daily  . pregabalin  75 mg Oral Daily  . senna-docusate  1 tablet Oral BID   Continuous Infusions: . sodium chloride 10 mL/hr at 12/05/16 0728  . dextrose 40 mL/hr at 12/05/16 1034   PRN Meds:.acetaminophen, fentaNYL (  SUBLIMAZE) injection, haloperidol lactate, methocarbamol, oxyCODONE-acetaminophen, RESOURCE THICKENUP CLEAR   PHYSICAL EXAM: Vital signs: Vitals:   12/05/16 0401 12/05/16 0755 12/05/16 0815 12/05/16 1126  BP: (!) 142/90 (!) 150/102    Pulse: (!) 102     Resp:      Temp: 98.1 F (36.7 C) 97.9 F (36.6 C)  97.6 F (36.4 C)  TempSrc: Oral Oral  Oral  SpO2: 91%  (!) 88%   Weight: 106.5 kg (234 lb 11.2 oz)     Height:       Filed Weights    12/04/16 0700 12/05/16 0401  Weight: 104.7 kg (230 lb 13.2 oz) 106.5 kg (234 lb 11.2 oz)   Body mass index is 30.13 kg/m.   General appearance :Awake, alert, not in any distress.  Eyes:, pupils equally reactive to light and accomodation,no scleral icterus. HEENT: Atraumatic and Normocephalic Neck: supple, no JVD. No cervical lymphadenopathy. No thyromegaly Resp:Good air entry bilaterally, clear to auscultation anteriorly CVS: S1 S2 regular GI: Bowel sounds present, Non tender and not distended with no gaurding, rigidity or rebound.No organomegaly Extremities: B/L Lower Ext shows no edema, both legs are warm to touch Neurology: Left hemiplegia Musculoskeletal:No digital cyanosis Skin:No Rash, warm and dry Wounds:N/A  I have personally reviewed following labs and imaging studies  LABORATORY DATA: CBC:  Recent Labs Lab 11/29/16 0357  12/01/16 0224 12/02/16 0342 12/03/16 0320 12/04/16 0236 12/05/16 0209  WBC 14.0*  < > 18.0* 16.9* 16.9* 21.6* 22.3*  NEUTROABS 12.4*  --   --   --   --   --   --   HGB 14.6  < > 14.0 15.4 16.3 16.7 16.4  HCT 43.3  < > 40.4 44.7 48.3 50.0 49.4  MCV 98.0  < > 95.5 96.8 98.6 99.6 100.6*  PLT 152  < > 178 202 209 235 242  < > = values in this interval not displayed.  Basic Metabolic Panel:  Recent Labs Lab 11/29/16 0357 11/30/16 0523 12/01/16 0224 12/02/16 0342 12/03/16 0320 12/04/16 0236 12/05/16 0209  NA 136 139 141 142 147* 150* 151*  K 4.1 3.9 4.0 4.9 4.2 4.3 4.3  CL 101 101 101 99* 101 106 109  CO2 23 24 27 30 31 31 31   GLUCOSE 212* 193* 139* 175* 179* 151* 200*  BUN 32* 43* 49* 61* 68* 86* 87*  CREATININE 1.82* 1.83* 1.57* 1.97* 2.11* 2.43* 2.32*  CALCIUM 9.0 9.2 9.4 9.6 9.5 9.7 9.5  MG 1.8 2.0  --   --   --   --   --     GFR: Estimated Creatinine Clearance: 43.5 mL/min (by C-G formula based on SCr of 2.32 mg/dL (H)).  Liver Function Tests:  Recent Labs Lab 11/29/16 0357  AST 27  ALT 17  ALKPHOS 61  BILITOT 0.9   PROT 7.1  ALBUMIN 2.4*   No results for input(s): LIPASE, AMYLASE in the last 168 hours. No results for input(s): AMMONIA in the last 168 hours.  Coagulation Profile:  Recent Labs Lab 11/28/16 1605 11/29/16 0357 11/30/16 0523 12/01/16 0224  INR 2.49 2.46 1.31 1.20    Cardiac Enzymes: No results for input(s): CKTOTAL, CKMB, CKMBINDEX, TROPONINI in the last 168 hours.  BNP (last 3 results) No results for input(s): PROBNP in the last 8760 hours.  HbA1C: No results for input(s): HGBA1C in the last 72 hours.  CBG:  Recent Labs Lab 12/04/16 1135 12/04/16 1644 12/04/16 2135 12/05/16 0757 12/05/16 1127  GLUCAP 238* 134* 188*  158* 271*    Lipid Profile: No results for input(s): CHOL, HDL, LDLCALC, TRIG, CHOLHDL, LDLDIRECT in the last 72 hours.  Thyroid Function Tests: No results for input(s): TSH, T4TOTAL, FREET4, T3FREE, THYROIDAB in the last 72 hours.  Anemia Panel: No results for input(s): VITAMINB12, FOLATE, FERRITIN, TIBC, IRON, RETICCTPCT in the last 72 hours.  Urine analysis:    Component Value Date/Time   COLORURINE YELLOW 11/27/2016 0355   APPEARANCEUR CLEAR 11/27/2016 0355   LABSPEC 1.015 11/27/2016 0355   PHURINE 6.0 11/27/2016 0355   GLUCOSEU NEGATIVE 11/27/2016 0355   HGBUR LARGE (A) 11/27/2016 0355   BILIRUBINUR NEGATIVE 11/27/2016 0355   KETONESUR NEGATIVE 11/27/2016 0355   PROTEINUR 100 (A) 11/27/2016 0355   NITRITE NEGATIVE 11/27/2016 0355   LEUKOCYTESUR NEGATIVE 11/27/2016 0355    Sepsis Labs: Lactic Acid, Venous    Component Value Date/Time   LATICACIDVEN 1.9 11/28/2016 2023    MICROBIOLOGY: Recent Results (from the past 240 hour(s))  Surgical pcr screen     Status: Abnormal   Collection Time: 11/27/16  3:58 PM  Result Value Ref Range Status   MRSA, PCR POSITIVE (A) NEGATIVE Final   Staphylococcus aureus POSITIVE (A) NEGATIVE Final    Comment:        The Xpert SA Assay (FDA approved for NASAL specimens in patients over 54  years of age), is one component of a comprehensive surveillance program.  Test performance has been validated by [facility] for patients greater than or equal to 10 year old. It is not intended to diagnose infection nor to guide or monitor treatment. RESULT CALLED TO, READ BACK BY AND VERIFIED WITH: C FOUSHEE,RN AT 1818 11/27/16 BY L BENFIELD   Culture, blood (routine x 2)     Status: None   Collection Time: 11/27/16  9:52 PM  Result Value Ref Range Status   Specimen Description BLOOD RIGHT HAND  Final   Special Requests BOTTLES DRAWN AEROBIC AND ANAEROBIC  5CC EA  Final   Culture NO GROWTH 5 DAYS  Final   Report Status 12/02/2016 FINAL  Final  Culture, blood (routine x 2)     Status: None   Collection Time: 11/27/16  9:52 PM  Result Value Ref Range Status   Specimen Description BLOOD RIGHT HAND  Final   Special Requests BOTTLES DRAWN AEROBIC AND ANAEROBIC  5CC EA  Final   Culture NO GROWTH 5 DAYS  Final   Report Status 12/03/2016 FINAL  Final    RADIOLOGY STUDIES/RESULTS: Dg Chest 1 View  Result Date: 11/27/2016 CLINICAL DATA:  Larey Seat today.  Left hip fracture. EXAM: CHEST 1 VIEW COMPARISON:  02/24/2016 FINDINGS: Diffuse interstitial coarsening is probably chronic. No consolidation. No effusion. No pneumothorax. New unchanged hilar, mediastinal and cardiac contours. IMPRESSION: Chronic appearing interstitial coarsening. No consolidation or effusion. Electronically Signed   By: Ellery Plunk M.D.   On: 11/27/2016 01:14   Dg Pelvis 1-2 Views  Result Date: 11/27/2016 CLINICAL DATA:  Larey Seat today while trying to get out of bed unassisted. EXAM: PELVIS - 1-2 VIEW COMPARISON:  None. FINDINGS: There is an acute subcapital left hip fracture. No dislocation. No bone lesion or bony destruction. Pelvis appears intact. IMPRESSION: Subcapital left hip fracture Electronically Signed   By: Ellery Plunk M.D.   On: 11/27/2016 01:13   Ct Head Wo Contrast  Result Date: 11/27/2016 CLINICAL DATA:   Larey Seat getting out of bed today, acute LEFT hip fracture. History of hypertension, stroke. EXAM: CT HEAD WITHOUT CONTRAST TECHNIQUE:  Contiguous axial images were obtained from the base of the skull through the vertex without intravenous contrast. COMPARISON:  CT HEAD January 26, 2016 FINDINGS: BRAIN: No intraparenchymal hemorrhage, mass effect, midline shift or acute large vascular territory infarcts. Old RIGHT greater than LEFT cerebellar infarcts, severe brachium contrast atrophy. Old RIGHT greater than LEFT pontine infarcts. Old LEFT thalamus and bilateral basal ganglia lacunar infarcts. Old small RIGHT occipital lobe infarct. LEFT inferior temporal lobe encephalomalacia is likely posttraumatic. Mild to moderate white matter changes compatible with chronic small vessel ischemic disease. No abnormal extra-axial fluid collection. Moderate ventriculomegaly on the basis of global parenchymal brain volume loss. VASCULAR: Mild to moderate calcific atherosclerosis of the carotid siphons. SKULL: No skull fracture. No significant scalp soft tissue swelling. SINUSES/ORBITS: Bilateral maxillary mucosal retention cysts. Mastoid air cells are well aerated. The included ocular globes and orbital contents are non-suspicious. OTHER: Patient is edentulous. IMPRESSION: No acute intracranial process. Stable examination including numerous old supra- and infratentorial infarcts predominately within posterior circulation, moderate global brain atrophy. Electronically Signed   By: Awilda Metro M.D.   On: 11/27/2016 03:01   Dg Chest Port 1 View  Result Date: 12/05/2016 CLINICAL DATA:  Shortness of breath . EXAM: PORTABLE CHEST 1 VIEW COMPARISON:  12/04/2016 . FINDINGS: Cardiomegaly with bilateral from interstitial prominence. No change from prior exam. No pleural effusion or pneumothorax P IMPRESSION: Cardiomegaly with bilateral mild interstitial prominence consistent interstitial edema again noted. No interim change from prior  exam. Electronically Signed   By: Maisie Fus  Register   On: 12/05/2016 07:23   Dg Chest Port 1 View  Result Date: 12/04/2016 CLINICAL DATA:  Shortness of Breath EXAM: PORTABLE CHEST 1 VIEW COMPARISON:  12/03/2016 FINDINGS: Cardiomegaly. Mild interstitial prominence and vascular congestion. Question mild interstitial edema. No effusions or acute bony abnormality. IMPRESSION: Vascular congestion with interstitial prominence, question mild interstitial edema. Electronically Signed   By: Charlett Nose M.D.   On: 12/04/2016 07:11   Dg Chest Port 1 View  Result Date: 12/03/2016 CLINICAL DATA:  Shortness of Breath EXAM: PORTABLE CHEST 1 VIEW COMPARISON:  12/02/2016 FINDINGS: Cardiac shadow is enlarged. Lungs are well aerated bilaterally. Vascular congestion is noted without significant interstitial edema. No sizable effusion is noted. No bony abnormality is seen. IMPRESSION: Stable vascular congestion. Electronically Signed   By: Alcide Clever M.D.   On: 12/03/2016 15:20   Dg Chest Port 1 View  Result Date: 12/02/2016 CLINICAL DATA:  Dyspnea EXAM: PORTABLE CHEST 1 VIEW COMPARISON:  Chest radiograph from one day prior. FINDINGS: Slightly right rotated chest radiograph. Stable cardiomediastinal silhouette with mild cardiomegaly. No pneumothorax. No pleural effusion. Mild pulmonary edema, decreased. IMPRESSION: Mild congestive heart failure, improved. Electronically Signed   By: Delbert Phenix M.D.   On: 12/02/2016 08:06   Dg Chest Port 1 View  Addendum Date: 12/01/2016   ADDENDUM REPORT: 12/01/2016 08:08 ADDENDUM: There is an old healed right clavicle fracture, stable. Electronically Signed   By: Bretta Bang III M.D.   On: 12/01/2016 08:08   Result Date: 12/01/2016 CLINICAL DATA:  Shortness of Breath EXAM: PORTABLE CHEST 1 VIEW COMPARISON:  November 30, 2016 FINDINGS: Extensive airspace consolidation bilaterally, most severe in the left upper to mid lung zones, up remains without significant change. There is  cardiomegaly with pulmonary venous hypertension. No adenopathy evident. No bone lesions. IMPRESSION: Widespread airspace consolidation bilaterally, somewhat more on the left than on the right. Evidence a degree of underlying pulmonary vascular congestion. Question multifocal pneumonia versus pulmonary edema; both  entities may well exist concurrently. The overall appearance of the lungs and cardiomediastinal silhouette is stable compared to 1 day prior. Electronically Signed: By: Bretta Bang III M.D. On: 12/01/2016 08:02   Dg Chest Port 1 View  Result Date: 11/28/2016 CLINICAL DATA:  Shortness of breath. EXAM: PORTABLE CHEST 1 VIEW COMPARISON:  11/27/2016 and 02/24/2016 FINDINGS: The patient has progressive bilateral pulmonary infiltrates which may represent pulmonary edema or progressive pneumonia. Pulmonary vascularity appears normal in the overall heart size is normal. IMPRESSION: Probable progressive bilateral pneumonia. Electronically Signed   By: Francene Boyers M.D.   On: 11/28/2016 15:31   Dg Chest Port 1 View  Result Date: 11/27/2016 CLINICAL DATA:  sob EXAM: PORTABLE CHEST 1 VIEW COMPARISON:  11/27/2016 FINDINGS: The heart is enlarged. There are airspace filling opacities bilaterally, particularly well seen at the left lung base. Patient is rotated. IMPRESSION: Bilateral airspace filling opacities consistent with pulmonary edema and/or infectious process. Electronically Signed   By: Norva Pavlov M.D.   On: 11/27/2016 22:17   Dg Chest Port 1v Same Day  Result Date: 11/30/2016 CLINICAL DATA:  Shortness of breath, cough today, pneumonia, history hypertension, prior respiratory failure, coronary disease, chronic systolic CHF, ischemic cardiomyopathy EXAM: PORTABLE CHEST 1 VIEW COMPARISON:  Portable exam 0922 hours compared to 11/28/2016 FINDINGS: Rotated to the LEFT with kyphotic positioning. Enlargement of cardiac silhouette. Stable mediastinal contours for degree of rotation. Extensive  BILATERAL pulmonary infiltrates favor pneumonia over edema. No pleural effusion or pneumothorax. Bones unremarkable. IMPRESSION: Persistent BILATERAL pulmonary infiltrates question pneumonia. Enlargement of cardiac silhouette. Electronically Signed   By: Ulyses Southward M.D.   On: 11/30/2016 09:31   Dg Swallowing Func-speech Pathology  Result Date: 11/30/2016 Objective Swallowing Evaluation: Type of Study: MBS-Modified Barium Swallow Study Patient Details Name: MCKYLE SOLANKI MRN: [id number] Date of Birth: Apr 03, 1956 Today's Date: 11/30/2016 Time: SLP Start Time (ACUTE ONLY): 1310-SLP Stop Time (ACUTE ONLY): 1325 SLP Time Calculation (min) (ACUTE ONLY): 15 min Past Medical History: Past Medical History: Diagnosis Date . Acute respiratory failure (HCC)   a. Spring 2017 following fall/splenectomy -->admitted to [facility]-->trach/g tube. . Anemia   a. 12/2015 ABL in setting of fall/hematomas/splenic laceration req splenectomy. Marland Kitchen Apical mural thrombus   a. 11/2015 Echo: EF 25-30%, moderate size apical thrombus-->coumadin;  b. 12/2015 f/u Echo: EF 25-30%, ant/septa HK, no obvious large thrombus but cannot exclude small mural thrombus. Marland Kitchen CAD (coronary artery disease)   a. 12/2014 s/p BMS to the RCA. Marland Kitchen Cerebral infarction (HCC)  . Chronic systolic CHF (congestive heart failure) (HCC)   a. 12/2015 Echo: EF 25-30%. . Dementia  . Depression  . Dysphagia  . Falls   a. 11/2015 traumatic fall with resultant trauma req splenectomy and prolonged hospitalization complicated by resp failure. Marland Kitchen History of pneumonia  . Hyperlipidemia  . Hypertension  . Ischemic cardiomyopathy   a. 12/2015 Echo: EF 25-30%, anterior and septal HK. Marland Kitchen Left hemiplegia (HCC)  . Protein calorie malnutrition (HCC)  . Respiratory failure (HCC)  . Spleen injury  . Status post tracheostomy (HCC)   a. 01/2016 in setting of ongoing resp failure and aspiration. Past Surgical History: Past Surgical History: Procedure Laterality Date . CARDIAC CATHETERIZATION   .  CORONARY ANGIOPLASTY WITH STENT PLACEMENT   . IR GENERIC HISTORICAL  07/22/2016  IR GASTROSTOMY TUBE REMOVAL 07/22/2016 [name], MD [facility]-INTERV RAD . PR LAP, SPLENECTOMY  01/07/2016 . TRACHEOSTOMY TUBE PLACEMENT N/A 02/22/2016  Procedure: TRACHEOSTOMY;  Surgeon: [name], MD;  Location:  [facility] OR;  Service: ENT;  Laterality: N/A; HPI: Pt. with PMH of HTN, chronic systolic CHF, chronic respiratory failure on 3L of oxygen, diabetes mellitus, recurrent fall, chronic anticoagulation, dyslipidemia; admitted on 11/26/2016, with complaint of A mechanical fall, was found to have closed left hip fracture.Patient developed aspiration pneumonia in the hospital. Currently surgery is on hold until pneumonia is better. Per MD pt was on dys 2/nectar thick liquids at SNF, but has been on thin liquids here. Pt had an MBS during admission to [facility] in 2017, but record not available. Pt also had a trach at that time. Reproted has a history of dysphagia due to prior CVAs.  No Data Recorded Assessment / Plan / Recommendation CHL IP CLINICAL IMPRESSIONS 11/30/2016 Therapy Diagnosis Severe pharyngeal phase dysphagia Clinical Impression Pt demonstrates a severe oropharygneal dysphagia with decreased duration and magnitude of hyolaryngeal excursion leading to severe oropharyngeal residuals that are aspirated with and without sensation in further swallow attempts. Base of tongue propulsion of bolus is also poor and epiglottis does not deflect for airway protection. Thin liquids pose a severe risk of aspiration and nectar thick liquids reduce severity of aspiration events, but are still likely to be aspirated due to increased residuals with thick viscosity. A chin tuck significantly improves bolus transit if fully tucked against chest. Would suspect that carry over for this will be poor, but will trial dys 3 solids and nectar thick liquids with full supervision for a chin tuck to determine if it is achievable.  Impact on  safety and function Severe aspiration risk   CHL IP TREATMENT RECOMMENDATION 11/30/2016 Treatment Recommendations Therapy as outlined in treatment plan below   Prognosis 11/30/2016 Prognosis for Safe Diet Advancement Guarded Barriers to Reach Goals Severity of deficits;Time post onset Barriers/Prognosis Comment -- CHL IP DIET RECOMMENDATION 11/30/2016 SLP Diet Recommendations Dysphagia 3 (Mech soft) solids;Nectar thick liquid Liquid Administration via Cup;Straw Medication Administration Whole meds with puree Compensations Chin tuck;Slow rate;Small sips/bites;Minimize environmental distractions Postural Changes Remain semi-upright after after feeds/meals (Comment);Seated upright at 90 degrees   CHL IP OTHER RECOMMENDATIONS 11/30/2016 Recommended Consults -- Oral Care Recommendations Oral care BID Other Recommendations Order thickener from pharmacy   [facility]L IP FOLLOW UP RECOMMENDATIONS 11/30/2016 Follow up Recommendations Skilled Nursing facility   [facility] IP FREQUENCY AND DURATION 11/30/2016 Speech Therapy Frequency (ACUTE ONLY) min 2x/week Treatment Duration 2 weeks      CHL IP ORAL PHASE 11/30/2016 Oral Phase WFL Oral - Pudding Teaspoon -- Oral - Pudding Cup -- Oral - Honey Teaspoon -- Oral - Honey Cup -- Oral - Nectar Teaspoon -- Oral - Nectar Cup -- Oral - Nectar Straw -- Oral - Thin Teaspoon -- Oral - Thin Cup -- Oral - Thin Straw -- Oral - Puree -- Oral - Mech Soft -- Oral - Regular -- Oral - Multi-Consistency -- Oral - Pill -- Oral Phase - Comment --  CHL IP PHARYNGEAL PHASE 11/30/2016 Pharyngeal Phase Impaired Pharyngeal- Pudding Teaspoon -- Pharyngeal -- Pharyngeal- Pudding Cup -- Pharyngeal -- Pharyngeal- Honey Teaspoon -- Pharyngeal -- Pharyngeal- Honey Cup -- Pharyngeal -- Pharyngeal- Nectar Teaspoon -- Pharyngeal -- Pharyngeal- Nectar Cup Reduced epiglottic inversion;Reduced anterior laryngeal mobility;Reduced airway/laryngeal closure;Reduced tongue base retraction;Delayed swallow initiation-pyriform sinuses;Pharyngeal residue  - valleculae;Pharyngeal residue - pyriform;Compensatory strategies attempted (with notebox) Pharyngeal -- Pharyngeal- Nectar Straw Reduced epiglottic inversion;Reduced anterior laryngeal mobility;Reduced airway/laryngeal closure;Reduced tongue base retraction;Delayed swallow initiation-pyriform sinuses;Pharyngeal residue - valleculae;Pharyngeal residue - pyriform;Compensatory strategies attempted (with notebox);Other (Comment) Pharyngeal -- Pharyngeal- Thin Teaspoon -- Pharyngeal --  Pharyngeal- Thin Cup Reduced epiglottic inversion;Reduced anterior laryngeal mobility;Reduced airway/laryngeal closure;Reduced tongue base retraction;Delayed swallow initiation-pyriform sinuses;Pharyngeal residue - valleculae;Pharyngeal residue - pyriform;Penetration/Apiration after swallow;Significant aspiration (Amount) Pharyngeal Material enters airway, passes BELOW cords and not ejected out despite cough attempt by patient Pharyngeal- Thin Straw Reduced epiglottic inversion;Reduced anterior laryngeal mobility;Reduced airway/laryngeal closure;Reduced tongue base retraction;Delayed swallow initiation-pyriform sinuses;Pharyngeal residue - valleculae;Pharyngeal residue - pyriform;Compensatory strategies attempted (with notebox);Penetration/Aspiration during swallow Pharyngeal Material enters airway, passes BELOW cords without attempt by patient to eject out (silent aspiration) Pharyngeal- Puree Reduced epiglottic inversion;Reduced anterior laryngeal mobility;Reduced airway/laryngeal closure;Reduced tongue base retraction;Pharyngeal residue - valleculae;Pharyngeal residue - pyriform;Compensatory strategies attempted (with notebox);Delayed swallow initiation-vallecula Pharyngeal -- Pharyngeal- Mechanical Soft Reduced epiglottic inversion;Reduced anterior laryngeal mobility;Reduced airway/laryngeal closure;Reduced tongue base retraction;Pharyngeal residue - valleculae;Pharyngeal residue - pyriform;Compensatory strategies attempted (with  notebox);Delayed swallow initiation-vallecula Pharyngeal -- Pharyngeal- Regular -- Pharyngeal -- Pharyngeal- Multi-consistency -- Pharyngeal -- Pharyngeal- Pill Reduced epiglottic inversion;Reduced anterior laryngeal mobility;Reduced airway/laryngeal closure;Reduced tongue base retraction;Pharyngeal residue - valleculae;Pharyngeal residue - pyriform;Compensatory strategies attempted (with notebox);Delayed swallow initiation-vallecula Pharyngeal -- Pharyngeal Comment --  No flowsheet data found. CHL IP GO 11/29/2016 Functional Assessment Tool Used clinical judgement Functional Limitations Swallowing Swallow Current Status [phone]) CJ Swallow Goal Status (U0454) CJ Swallow Discharge Status (U9811) (None) Motor Speech Current Status [phone]) (None) Motor Speech Goal Status (G9562) (None) Motor Speech Goal Status (Z3086) (None) Spoken Language Comprehension Current Status (V7846) (None) Spoken Language Comprehension Goal Status (N6295) (None) Spoken Language Comprehension Discharge Status [phone]) (None) Spoken Language Expression Current Status [phone]) (None) Spoken Language Expression Goal Status [phone]) (None) Spoken Language Expression Discharge Status [phone]) (None) Attention Current Status (I3474) (None) Attention Goal Status (Q5956) (None) Attention Discharge Status [phone]) (None) Memory Current Status (E3329) (None) Memory Goal Status (J1884) (None) Memory Discharge Status (Z6606) (None) Voice Current Status (T0160) (None) Voice Goal Status (F0932) (None) Voice Discharge Status (T5573) (None) Other Speech-Language Pathology Functional Limitation [phone]) (None) Other Speech-Language Pathology Functional Limitation Goal Status (K2706) (None) Other Speech-Language Pathology Functional Limitation Discharge Status [phone]) (None) Harlon Ditty, MA CCC-SLP [phone] [place], Riley Nearing 11/30/2016, 2:50 PM              Dg Femur Min 2 Views Left  Result Date: 11/27/2016 CLINICAL DATA:  Larey Seat while attempting to get out  of bed unassisted today. EXAM: LEFT FEMUR 2 VIEWS COMPARISON:  None. FINDINGS: There is a subcapital left hip fracture. Remainder of the femur is intact. No dislocation. No bone lesion or bony destruction. IMPRESSION: Subcapital left hip fracture Electronically Signed   By: Ellery Plunk M.D.   On: 11/27/2016 01:13     LOS: 8 days   Jeoffrey Massed, MD  [facility] Pager:[phone]  If 7PM-7AM, please contact night-coverage [email or web address] Password [facility] 12/05/2016, 11:41 AM

## 2016-12-05 NOTE — Progress Notes (Signed)
Daily Progress Note   Patient Name: Curtis Keller       Date: 12/05/2016 DOB: 1956-09-05  Age: 61 y.o. MRN#: [id number] Attending Physician: Jonetta Osgood, MD Primary Care Physician: Gildardo Cranker, DO Admit Date: 11/26/2016  Reason for Consultation/Follow-up: Establishing goals of care  Subjective: Curtis Keller continues to be lethargic but did arouse and communicate to ask for water and ice cream. Also able to have brief discussion regarding [place] with myself and his sister at bedside - see below.   Length of Stay: 8  Current Medications: Scheduled Meds:  . aspirin EC  81 mg Oral Daily  . atorvastatin  40 mg Oral QHS  . busPIRone  10 mg Oral BID  . divalproex  125 mg Oral TID  . famotidine  20 mg Oral BID  . feeding supplement (ENSURE ENLIVE)  237 mL Oral BID BM  . FLUoxetine  60 mg Oral Daily  . heparin subcutaneous  5,000 Units Subcutaneous Q8H  . hydrALAZINE  12.5 mg Oral q12n4p  . insulin aspart  0-9 Units Subcutaneous TID WC  . ipratropium-albuterol  3 mL Nebulization TID  . mouth rinse  15 mL Mouth Rinse BID  . metoprolol succinate  25 mg Oral Daily  . nicotine  21 mg Transdermal Daily  . polyethylene glycol  17 g Oral Daily  . potassium chloride  20 mEq Oral Daily  . pregabalin  75 mg Oral Daily  . senna-docusate  1 tablet Oral BID    Continuous Infusions: . sodium chloride 10 mL/hr at 12/05/16 0728  . dextrose 40 mL/hr at 12/05/16 1034    PRN Meds: acetaminophen, fentaNYL (SUBLIMAZE) injection, haloperidol lactate, methocarbamol, oxyCODONE-acetaminophen, RESOURCE THICKENUP CLEAR  Physical Exam  Constitutional: He is oriented to person, place, and time. He appears well-developed. He appears lethargic.  HENT:  Head: Normocephalic and atraumatic.    Cardiovascular: Normal rate.   Occasional PVC  Pulmonary/Chest: Effort normal. No accessory muscle usage. No tachypnea. No respiratory distress.  Abdominal: Normal appearance.  Neurological: He is oriented to person, place, and time. He appears lethargic.  Nursing note and vitals reviewed.           Vital Signs: BP (!) 150/102 (BP Location: Left Wrist)   Pulse (!) 102   Temp 97.6 F (36.4 C) (Oral)   Resp  18   Ht 6' 2"  (1.88 m)   Wt 106.5 kg (234 lb 11.2 oz)   SpO2 94%   BMI 30.13 kg/m  SpO2: SpO2: 94 % O2 Device: O2 Device: Nasal Cannula O2 Flow Rate: O2 Flow Rate (L/min): 3 L/min  Intake/output summary:   Intake/Output Summary (Last 24 hours) at 12/05/16 1436 Last data filed at 12/05/16 0600  Gross per 24 hour  Intake             1020 ml  Output             1050 ml  Net              -30 ml   LBM: Last BM Date: 11/30/16 Baseline Weight: Weight: 102.1 kg (225 lb) Most recent weight: Weight: 106.5 kg (234 lb 11.2 oz)       Palliative Assessment/Data:    Flowsheet Rows   Flowsheet Row Most Recent Value  Intake Tab  Referral Department  Hospitalist  Unit at Time of Referral  ICU  Palliative Care Primary Diagnosis  Cardiac  Date Notified  11/30/16  Palliative Care Type  New Palliative care  Reason for referral  Clarify Goals of Care  Date of Admission  11/26/16  Date first seen by Palliative Care  11/30/16  # of days Palliative referral response time  0 Day(s)  # of days IP prior to Palliative referral  4  Clinical Assessment  Psychosocial & Spiritual Assessment  Palliative Care Outcomes      Patient Active Problem List   Diagnosis Date Noted  . Ischemic cardiomyopathy   . Goals of care, counseling/discussion   . Palliative care encounter   . Acute systolic congestive heart failure ([place])   . Acute and chronic respiratory failure with hypoxia ([place]) 11/28/2016  . Hip fracture ([place]) 11/27/2016  . Closed nondisplaced intertrochanteric fracture of left  femur ([place]) 11/27/2016  . Chronic respiratory failure ([place]) 11/27/2016  . Fall at nursing home 07/17/2016  . Diabetes mellitus, type II, insulin dependent ([place]) 06/12/2016  . GERD (gastroesophageal reflux disease) 05/07/2016  . COPD (chronic obstructive pulmonary disease) ([place]) 04/18/2016  . Chronic anticoagulation 03/22/2016  . S/P splenectomy 03/13/2016  . PEG (percutaneous endoscopic gastrostomy) status ([place]) 03/13/2016  . MRSA (methicillin resistant Staphylococcus aureus) septicemia ([place]) 03/13/2016  . Aspiration pneumonia ([place]) 03/13/2016  . Dysphagia following cerebrovascular accident 03/13/2016  . Acute blood loss anemia 03/13/2016  . Hyperlipidemia 03/13/2016  . Heart failure, chronic systolic ([place])   . Hypertension   . Depression   . Status post tracheostomy ([place])   . Cardiomyopathy ([place])   . Apical mural thrombus   . Protein calorie malnutrition ([place])   . Intraparenchymal hematoma of brain due to trauma [facility])     Palliative Care Assessment & Plan   HPI: 61 y.o. male  with past medical history ofHTN, systolic CHF EF 66-59%,DJT, DM type II, AAA, COPD, chronic respiratory failure on 2 L of NCoxygen at night, CVA with residual left-sided hemiparesis/dysphagia, apical thrombus on Coumadin, splenectomy s/p fall with complications resulting in tracheostomy x 6 weeks admitted on 11/26/2016 with left hip fracture s/p fall at SNF. Awaiting hip repair d/t concern for respiratory status aspiration pneumonia and heart failure exacerbation. High risk for post-op delirium as well as he is already exhibiting signs of delirium. Surgery again postponed 2/12 d/t worsening renal function. WBC also more elevated and making little to no progress or improvement.    Assessment: I met at Curtis Keller  bedside again with his sister, Curtis Keller. I had a frank discussion with [name] regarding Curtis Keller continued decline. Mr. Zapien did awaken to discuss with Curtis Keller as well. He was able to tell me who he  was, who Curtis Keller was (and ask about her husband by name), tell me the year "2018," and that he is in the "hospital" because he "broke his leg."  I took this opportunity to talk with him about his decline and concern for poor health. I asked him who he would want to make decisions for him medically (he names Curtis Keller and she requests HCPOA paperwork that was given and chaplain requested to notarize). I asked Mr. Saxer about life support and resuscitation which he was not very inclined to pursue but Curtis Keller has talked him into short term intubation if indicated. He responded "no, just let me die" which tells me he understands the consequences of this decision. I did tell Curtis Keller that if he continues to decline then it would not make sense to put him through this given his response to me today. I also understand this to mean NO TRACH/prolonged intubation.   Curtis Keller is tearful and I did encourage her that she needs to start thinking and preparing herself that he may continue to decline. She tells me that she will also call his daughters again (daughters numbers updated in chart).   Recommendations/Plan:  Pain: Continue percocet and fentanyl prn.   Delirium: Continue haldol prn. May be more problematic post op.   Goals of Care and Additional Recommendations:  Limitations on Scope of Treatment: Full Scope Treatment  Code Status:  Full code  Prognosis:   Unable to determine  Discharge Planning:  To Be Determined   Thank you for allowing the Palliative Medicine Team to assist in the care of this patient.   Total Time 1030-1200 51mn Prolonged Time Billed  yes      Greater than 50%  of this time was spent counseling and coordinating care related to the above assessment and plan.  AVinie Sill NP Palliative Medicine Team Pager # [phone](M-F 8a-5p) Team Phone # [phone](Nights/Weekends)

## 2016-12-05 NOTE — Progress Notes (Addendum)
Speech Language Pathology Treatment: Dysphagia  Patient Details Name: Curtis Keller MRN: [id number] DOB: [birth year] Today's Date: 12/05/2016 Time: 4585-9292 SLP Time Calculation (min) (ACUTE ONLY): 30 min  Assessment / Plan / Recommendation Clinical Impression  Pt seen for assessment of diet tolerance following MBS last week. Sister present. Pt lethargic, requiring cues to maintain alertness during trials of nectar thick liquid. SLP answered sister's questions regarding safe swallow precautions (regarding liquids during meals).  SLP reviewed MBS report and updated posted precautions with encouragement to cue pt to swallow 2x per bite/sip (due to post-swallow residue), and to cue pt to tuck chin with all po trials (to mitigate aspiration risk). No overt s/s aspiration observed during po trials, however, MBS documented inconsistent reflexive response to penetration/aspiration. Pt is at increased risk for aspiration given AMS/lethargy, history of CVAs, history of dysphagia, and inability to independently recall and implement safe swallow strategies. Per MD notes, sister is aware of aspiration risk and wants to continue with dys 3/nectar thick liquids. Education completed with her today, and she was provided the opportunity to ask questions.  ST will follow for assessment of po tolerance and education.   HPI HPI: Pt. with PMH of HTN, chronic systolic CHF, chronic respiratory failure on 3L of oxygen, diabetes mellitus, recurrent fall, chronic anticoagulation, dyslipidemia; admitted on 11/26/2016, with complaint of a mechanical fall, was found to have closed left hip fracture.Patient developed aspiration pneumonia in the hospital. Currently surgery is on hold until pneumonia is better. Per MD pt was on dys 2/nectar thick liquids at SNF, but has been on thin liquids here. Pt had an MBS during admission to [facility] in 2017, but record not available. Pt also had a trach at that time. Reported  has a history of dysphagia due to prior CVAs.  MBS completed 2/7 with recommendation for dys 3/NTL. ST following for diet tolerance assessment and education.      SLP Plan  Continue with current plan of care     Recommendations  Diet recommendations: Dysphagia 3 (mechanical soft);Nectar-thick liquid Liquids provided via: Teaspoon;Cup;Straw Medication Administration: Whole meds with liquid Supervision: Full supervision/cueing for compensatory strategies Compensations: Minimize environmental distractions;Slow rate;Small sips/bites;Use straw to facilitate chin tuck;Multiple dry swallows after each bite/sip Postural Changes and/or Swallow Maneuvers: Seated upright 90 degrees;Upright 30-60 min after meal         Oral Care Recommendations: Oral care BID Follow up Recommendations: Skilled Nursing facility Plan: Continue with current plan of care       GO             Celia B. Murvin [facility], CCC-SLP [phone]  [place] [place] 12/05/2016, 11:56 AM

## 2016-12-05 NOTE — Progress Notes (Signed)
Patient ID: Curtis Keller, male   DOB: 07-31-1956, 61 y.o.   MRN: [id number]  Left hip fracture in setting of significant acute on chromic medical conditions.     PELVIS - 1-2 VIEW  COMPARISON:  None.  FINDINGS: There is an acute subcapital left hip fracture. No dislocation. No bone lesion or bony destruction. Pelvis appears intact.  IMPRESSION: Subcapital left hip fracture    Plan: No OR today as renal function has not plateaued.  Still with respiratory issues related to acute pneumonia- on Zosyn  I am not certain when will be an optimal time to operate on him but I at least feel we need to be as stable medically as possible.  He is still acutely moderate to high risk for poor post-operative outcome  Resume diet and heparin per medicine

## 2016-12-05 NOTE — Care Management Important Message (Signed)
Important Message  Patient Details  Name: Curtis Keller MRN: [id number] Date of Birth: August 24, 1956   Medicare Important Message Given:  Yes    Hanley Hays, RN 12/05/2016, 2:16 PM

## 2016-12-05 NOTE — Progress Notes (Signed)
   12/05/16 1300  Clinical Encounter Type  Visited With Patient and family together  Visit Type Initial  Referral From Chaplain  Consult/Referral To Chaplain  Spiritual Encounters  Spiritual Needs Literature;Brochure  Stress Factors  Patient Stress Factors Health changes  Family Stress Factors Family relationships;Health changes  Advance Directives (For Healthcare)  Does Patient Have a Medical Advance Directive? No  Type of Advance Directive Healthcare Power of Attorney  Would patient like information on creating a medical advance directive? Yes (Inpatient - patient requests chaplain consult to create a medical advance directive)  Pt.and Sibling want to discuss Advance Directive.  Will contact Spiritual Wellness when ready for notary assistance.   Chaplain left AD forms and gave brief overview.  Chaplain Harrietta Incorvaia A. Jaelin Fackler, MA-PC , BA-REL/PHIL , [phone]

## 2016-12-06 ENCOUNTER — Inpatient Hospital Stay (HOSPITAL_COMMUNITY): Payer: Medicare Other

## 2016-12-06 LAB — BASIC METABOLIC PANEL
ANION GAP: 13 (ref 5–15)
BUN: 88 mg/dL — ABNORMAL HIGH (ref 6–20)
CALCIUM: 9.2 mg/dL (ref 8.9–10.3)
CO2: 31 mmol/L (ref 22–32)
Chloride: 109 mmol/L (ref 101–111)
Creatinine, Ser: 2.53 mg/dL — ABNORMAL HIGH (ref 0.61–1.24)
GFR, EST AFRICAN AMERICAN: 30 mL/min — AB (ref >60)
GFR, EST NON AFRICAN AMERICAN: 26 mL/min — AB (ref >60)
Glucose, Bld: 146 mg/dL — ABNORMAL HIGH (ref 65–99)
Potassium: 4.5 mmol/L (ref 3.5–5.1)
SODIUM: 153 mmol/L — AB (ref 135–145)

## 2016-12-06 LAB — CBC
HEMATOCRIT: 48.3 % (ref 39.0–52.0)
Hemoglobin: 15.8 g/dL (ref 13.0–17.0)
MCH: 33.3 pg (ref 26.0–34.0)
MCHC: 32.7 g/dL (ref 30.0–36.0)
MCV: 101.7 fL — ABNORMAL HIGH (ref 78.0–100.0)
PLATELETS: 245 10*3/uL (ref 150–400)
RBC: 4.75 MIL/uL (ref 4.22–5.81)
RDW: 16.1 % — ABNORMAL HIGH (ref 11.5–15.5)
WBC: 18.1 10*3/uL — AB (ref 4.0–10.5)

## 2016-12-06 LAB — GLUCOSE, CAPILLARY
GLUCOSE-CAPILLARY: 195 mg/dL — AB (ref 65–99)
GLUCOSE-CAPILLARY: 144 mg/dL — AB (ref 65–99)
GLUCOSE-CAPILLARY: 171 mg/dL — AB (ref 65–99)
Glucose-Capillary: 229 mg/dL — ABNORMAL HIGH (ref 65–99)

## 2016-12-06 LAB — PROTIME-INR
INR: 1.18
Prothrombin Time: 15 seconds (ref 11.4–15.2)

## 2016-12-06 MED ORDER — DEXTROSE 5 % IV SOLN
INTRAVENOUS | Status: AC
  Administered 2016-12-06: 06:00:00 via INTRAVENOUS

## 2016-12-06 MED ORDER — SODIUM CHLORIDE 0.9 % IV SOLN
3.0000 g | Freq: Four times a day (QID) | INTRAVENOUS | Status: DC
  Administered 2016-12-06 – 2016-12-10 (×16): 3 g via INTRAVENOUS
  Filled 2016-12-06 (×18): qty 3

## 2016-12-06 NOTE — Progress Notes (Signed)
Pharmacy Antibiotic Note  Curtis Keller is a 61 y.o. male  and recently completed   course of Zosyn for PNA and noted noted with worsening chest X-ray. Pharmacy has been consulted for Unasyn dosing. -WBC= 18.1, afebrile, SCr= 2.53 and CrCL ~ 40  Plan: -Unasyn 3gm IV q6h -Will follow renal function and clinical progress   Height: 6\' 2"  (188 cm) Weight: 235 lb 6.4 oz (106.8 kg) IBW/kg (Calculated) : 82.2  Temp (24hrs), Avg:98.3 F (36.8 C), Min:97.8 F (36.6 C), Max:99.1 F (37.3 C)   Recent Labs Lab 12/02/16 0342 12/03/16 0320 12/04/16 0236 12/05/16 0209 12/06/16 0151  WBC 16.9* 16.9* 21.6* 22.3* 18.1*  CREATININE 1.97* 2.11* 2.43* 2.32* 2.53*    Estimated Creatinine Clearance: 39.9 mL/min (by C-G formula based on SCr of 2.53 mg/dL (H)).    Allergies  Allergen Reactions  . Codeine Anaphylaxis and Nausea And Vomiting  . Hydrocodone Other (See Comments)    On MAR  . Hydrocodone-Acetaminophen Nausea And Vomiting  . Tegaderm Ag Mesh [Silver] Other (See Comments)    On MAR  . Lisinopril Itching and Rash  . Tylenol [Acetaminophen] Nausea Only   Antibiotics  Vancomycin 2/5>> 2/8 Zosyn 2/5>> 2/11  Cultures  2/4 MRSA PCR pos 2/4 Blood x 2 > neg  Thank you for allowing pharmacy to be a part of this patient's care.  Harland German, Pharm D 12/06/2016 11:38 AM

## 2016-12-06 NOTE — Progress Notes (Signed)
Patient ID: Curtis Keller, male   DOB: 1956-09-27, 61 y.o.   MRN: [id number]  Patient very sick with multiple issues progressing or at least persistent with concerns from primary team of recurring aspirations.  Cr. Up to 2.53 WBC up  At this point no plans for surgical fixation of hip unless there is significant improvement or stabilization of his health condition.  I will remain in contact with treating team  Charlann Boxer [phone]

## 2016-12-06 NOTE — Progress Notes (Signed)
Daily Progress Note   Patient Name: Curtis Keller       Date: 12/06/2016 DOB: Sep 21, 1956  Age: 61 y.o. MRN#: [id number] Attending Physician: Jonetta Osgood, MD Primary Care Physician: Gildardo Cranker, DO Admit Date: 11/26/2016  Reason for Consultation/Follow-up: Establishing goals of care  Subjective: Curtis Keller continues to arouse but more confused today. Continues to request to go to the store and buy some milk.   Length of Stay: 9  Current Medications: Scheduled Meds:  . ampicillin-sulbactam (UNASYN) IV  3 g Intravenous Q6H  . aspirin EC  81 mg Oral Daily  . atorvastatin  40 mg Oral QHS  . busPIRone  10 mg Oral BID  . divalproex  125 mg Oral TID  . famotidine  20 mg Oral BID  . feeding supplement (ENSURE ENLIVE)  237 mL Oral BID BM  . FLUoxetine  60 mg Oral Daily  . heparin subcutaneous  5,000 Units Subcutaneous Q8H  . hydrALAZINE  12.5 mg Oral q12n4p  . insulin aspart  0-9 Units Subcutaneous TID WC  . ipratropium-albuterol  3 mL Nebulization TID  . mouth rinse  15 mL Mouth Rinse BID  . metoprolol succinate  25 mg Oral Daily  . nicotine  21 mg Transdermal Daily  . polyethylene glycol  17 g Oral Daily  . potassium chloride  20 mEq Oral Daily  . pregabalin  75 mg Oral Daily  . senna-docusate  1 tablet Oral BID    Continuous Infusions: . sodium chloride 10 mL/hr at 12/05/16 1945  . dextrose 40 mL/hr at 12/06/16 0609    PRN Meds: acetaminophen, fentaNYL (SUBLIMAZE) injection, haloperidol lactate, methocarbamol, oxyCODONE-acetaminophen, RESOURCE THICKENUP CLEAR  Physical Exam  Constitutional: He is oriented to person, place, and time. He appears well-developed. He appears lethargic.  HENT:  Head: Normocephalic and atraumatic.  Cardiovascular: Normal rate.     Occasional PVC  Pulmonary/Chest: Effort normal. No accessory muscle usage. No tachypnea. No respiratory distress.  Abdominal: Normal appearance.  Neurological: He is oriented to person, place, and time. He appears lethargic.  Nursing note and vitals reviewed.           Vital Signs: BP 120/83   Pulse 84   Temp 97.8 F (36.6 C)   Resp 17   Ht _0  (1.88 m)   Wt 106.8  kg (235 lb 6.4 oz)   SpO2 97%   BMI 30.22 kg/m  SpO2: SpO2: 97 % O2 Device: O2 Device: Nasal Cannula O2 Flow Rate: O2 Flow Rate (L/min): 5 L/min  Intake/output summary:   Intake/Output Summary (Last 24 hours) at 12/06/16 1439 Last data filed at 12/06/16 1200  Gross per 24 hour  Intake           1004.5 ml  Output             1400 ml  Net           -395.5 ml   LBM: Last BM Date: 12/06/16 Baseline Weight: Weight: 102.1 kg (225 lb) Most recent weight: Weight: 106.8 kg (235 lb 6.4 oz)       Palliative Assessment/Data:    Flowsheet Rows   Flowsheet Row Most Recent Value  Intake Tab  Referral Department  Hospitalist  Unit at Time of Referral  ICU  Palliative Care Primary Diagnosis  Cardiac  Date Notified  11/30/16  Palliative Care Type  New Palliative care  Reason for referral  Clarify Goals of Care  Date of Admission  11/26/16  Date first seen by Palliative Care  11/30/16  # of days Palliative referral response time  0 Day(s)  # of days IP prior to Palliative referral  4  Clinical Assessment  Psychosocial & Spiritual Assessment  Palliative Care Outcomes      Patient Active Problem List   Diagnosis Date Noted  . Ischemic cardiomyopathy   . Goals of care, counseling/discussion   . Palliative care encounter   . Acute systolic congestive heart failure ([place])   . Acute and chronic respiratory failure with hypoxia ([place]) 11/28/2016  . Hip fracture ([place]) 11/27/2016  . Closed nondisplaced intertrochanteric fracture of left femur ([place]) 11/27/2016  . Chronic respiratory failure ([place]) 11/27/2016  . Fall  at nursing home 07/17/2016  . Diabetes mellitus, type II, insulin dependent ([place]) 06/12/2016  . GERD (gastroesophageal reflux disease) 05/07/2016  . COPD (chronic obstructive pulmonary disease) ([place]) 04/18/2016  . Chronic anticoagulation 03/22/2016  . S/P splenectomy 03/13/2016  . PEG (percutaneous endoscopic gastrostomy) status ([place]) 03/13/2016  . MRSA (methicillin resistant Staphylococcus aureus) septicemia ([place]) 03/13/2016  . Aspiration pneumonia ([place]) 03/13/2016  . Dysphagia 03/13/2016  . Acute blood loss anemia 03/13/2016  . Hyperlipidemia 03/13/2016  . Heart failure, chronic systolic ([place])   . Hypertension   . Depression   . Status post tracheostomy ([place])   . Cardiomyopathy ([place])   . Apical mural thrombus   . Protein calorie malnutrition ([place])   . Intraparenchymal hematoma of brain due to trauma [facility])     Palliative Care Assessment & Plan   HPI: 61 y.o. male  with past medical history ofHTN, systolic CHF EF 41-66%,AYT, DM type II, AAA, COPD, chronic respiratory failure on 2 L of NCoxygen at night, CVA with residual left-sided hemiparesis/dysphagia, apical thrombus on Coumadin, splenectomy s/p fall with complications resulting in tracheostomy x 6 weeks admitted on 11/26/2016 with left hip fracture s/p fall at SNF. Awaiting hip repair d/t concern for respiratory status aspiration pneumonia and heart failure exacerbation. High risk for post-op delirium as well as he is already exhibiting signs of delirium. Surgery again postponed 2/12 d/t worsening renal function. WBC also more elevated and making little to no progress or improvement. 12/06/16 worsening with further cardiac, respiratory and renal failure.    Assessment: I met today at Curtis Keller bedside with sister, Curtis Keller. Curtis Keller is very tearful and expresses  anger, frustration, and sadness at her brother's situation. She talks about hospital events and things that she believes has put him in this situation and that this may have  been avoidable. She expresses anger but then immediately states that the doctors and nurses have taken good care of him. She tells me "I will not give up on my brother and I will not make him a DNR." She talks of dialysis, feeding tubes, and tracheostomy (he has had trach/PEG in the past) as "whatever he needs to get him better."   I challenge her by saying that I am concerned that these interventions will lead to a cycle of care that he expressed he would not want to [place] yesterday. She contemplates and agrees he did express this but then followed by the fact that he agreed when she asked him if he could do this for short term to get him better. We discuss individual interventions and why they may not "make him better." I also challenge by adding if he were to survive and his hip is not fixed he'd be bed bound. She talks of dialysis and then surgery and then dialysis again to clear anesthesia - but I remind her that he is still struggling with aspiration/respiratory issues and heart failure.   Throughout our conversation her anger gradually gives way to sadness as I do believe that she recognizes poor prognosis but does NOT want to accept this. I offered listening and emotional support. I attempted to call daughter [name] but no answer.   Recommendations/Plan:  Pain: Continue percocet and fentanyl prn.   Delirium: Continue haldol prn.   Goals of Care and Additional Recommendations:  Limitations on Scope of Treatment: Full Scope Treatment  Code Status:  Full code  Prognosis:   Unable to determine  Discharge Planning:  To Be Determined   Thank you for allowing the Palliative Medicine Team to assist in the care of this patient.   Total Time 1230-1345 68mn Prolonged Time Billed  yes      Greater than 50%  of this time was spent counseling and coordinating care related to the above assessment and plan.  AVinie Sill NP Palliative Medicine Team Pager # [phone](M-F  8a-5p) Team Phone # [phone](Nights/Weekends)

## 2016-12-06 NOTE — Progress Notes (Signed)
CSW continuing to follow for discharge needs.  Belenda Alviar LCSWA [phone]  

## 2016-12-06 NOTE — Progress Notes (Signed)
amion                        PROGRESS NOTE        PATIENT DETAILS Name: Curtis Keller Age: 61 y.o. Sex: male Date of Birth: [birth year] Admit Date: 11/26/2016 Admitting Physician Clydie Braun, MD ZOX:[id number], Maxine Glenn, DO  Brief Narrative: Patient is a 61 y.o. male with history chronic systolic heart failure (EF 25-30%), history of LV thrombus, prior CVA with chronic left-sided hemiplegia-multiple falls over the past few years causing splenic rupture-presented to the hospital on 2/3 following a mechanical fall, found to have a left hip fracture. Unfortunately hospital course has been complicated by development of acute hypoxic respiratory failure, felt due to aspiration PNA vs acute systolic CHF.   Subjective: Unfortunately looks like his oxygen requirements are up-chest x-ray-personal review-he has more consolidation in his right lower lobe. He looks more frail and sounds a whole lot more congested today.  Assessment/Plan: Closed Left hip fracture: Secondary to a mechanical fall-unfortunately before hip repair could be attempted-patient developed hypoxic resp failure due to aspiration pneumonia and acute systolic heart failure. Although he initially improved with antibiotics and Lasix, his oxygen requirements have again gone up-he is back to 5 L of oxygen today. His chest x-ray has some suggestion of probably a right lower lobe infiltrate, he continues to have progressive interstitial changes.  Palliative care has been following and has been engaging family members. I had a long conversation with the patient's sister over the phone, and with one of his daughters in [place]-unfortunately patient's overall prognosis is very poor, and maybe even transitioning him to full comfort measures is appropriate in this setting. He has been followed by cardiology, per cardiology note, from a cardiac point of view he is at moderate risk-however with his overall situation- he is at significant risk of having  perioperative complications and prolonged ventilator dependence if he undergoes surgery at this time. Family aware that there is a good chance that he may not survive this admission, and we may need to transition to full comfort measures if he continues to decline. Currently he is a full code, discussion in progress with family to delineate further goals of care.  Acute hypoxemic respiratory failure secondary to aspiration pneumonia and acute on chronic systolic heart failure: Patient had a aspiration event on 2/5 requiring transfer to stepdown and 100% nonrebreather mask. He was subsequently treated with IV antibiotics and IV Lasix. He initially improved and his oxygen was titrated down to 3 L via nasal cannula. He unfortunately seems to have worsened this morning, he sounds more congested, chest x-ray shows more congestion in his right lower lobe-I suspect he continues to have some amount of aspiration. Very difficult situation, we will continue to engage family-see above. He has completed a course of empiric Zosyn, but given worsening chest x-ray findings-we will go ahead and put him on IV Unasyn today.  Acute kidney injury: Suspect hemodynamic mediated acute kidney injury due to ongoing aspiration pneumonitis, congestive heart failure, diuretic use. Creatinine still elevated despite gentle hydration. Continue to follow electrolytes.   Hypernatremia: probably due to decrease in oral intake and diuretic use-difficult situation-since on exam volume status remains stable, we will continue to cautiously hydrate with D5W.  Dysphagia: Speech therapy following, underwent modified barium swallow-this is a chronic issue-patient has a prior history of CVA-he previously had a tube in place. Recommendations are to try a dysphagia 3 solids with nectar thick liquids.  Spoke with patient's sister over the phone on 2/10, she is aware of the aspiration risk and wishes to continue with Dys 3 diet with nectar thick  liquid.  History of left apical thrombus: Previously on Coumadin-currently on hold for surgery. Given high risk for venous thromboembolism post surgery, suspect will require short-term anticoagulation. Long discussion with patient's sister at bedside on 2/7, she would prefer short-term anticoagulation and is willing to accept all risks.  Leukocytosis: Suspect secondary to steroids and pneumonia-slowly decreasing-continue to follow periodically.  Hx of COPD: not in exacerbation-see above.   History of CVA with chronic left sided weakness: Previously on anticoagulation-left-sided deficits are unchanged. Continue statin. See above regarding anticoagulation  GERD: Continue Pepcid  Type 2 diabetes: CBGs stable-continue SSI  History of anxiety/depression/mild dementia: Continue Depakote, fluoxetine and BuSpar.  Frequent falls: Has had frequent falls in the past few years-one episode of fall resulted in a splenic laceration requiring splenectomy. Unfortunately, patient also has a history of LV thrombus and a CVA with left hemiplegia. Although he is a poor long term candidate, he is at significant risk of having venous thromboembolism post-hip surgery. Long discussion with the patient's sister on 2/7(who is a nurse at bedside)-she is agreeable to restart anticoagulation post operatively for the short-term.  Palliative care: Remains a full code, poor long-term prognosis-multiple medical comorbidities-and continues to deteriorate in spite of prolonged hospital stay. See above discussion. Palliative care continues to follow  DVT Prophylaxis: Prophylactic Heparin   Code Status: Full code   Family Communication: Extensive discussion with the patient's sister and patient's daughter who lives in [place] over the phone.  Disposition Plan: Remain inpatient-remain in SDU-SNF vs residential hospice on discharge  Antimicrobial agents: Anti-infectives    Start     Dose/Rate Route Frequency Ordered Stop    11/28/16 1530  piperacillin-tazobactam (ZOSYN) IVPB 3.375 g     3.375 g 12.5 mL/hr over 240 Minutes Intravenous Every 8 hours 11/28/16 1518 12/04/16 2359   11/28/16 1530  vancomycin (VANCOCIN) IVPB 750 mg/150 ml premix  Status:  Discontinued     750 mg 150 mL/hr over 60 Minutes Intravenous Every 12 hours 11/28/16 1518 12/01/16 1031   11/28/16 1520  ceFAZolin (ANCEF) IVPB 2g/100 mL premix  Status:  Discontinued     2 g 200 mL/hr over 30 Minutes Intravenous To ShortStay Surgical 11/28/16 1247 11/28/16 2334   11/28/16 1000  piperacillin-tazobactam (ZOSYN) IVPB 3.375 g  Status:  Discontinued     3.375 g 12.5 mL/hr over 240 Minutes Intravenous Every 8 hours 11/28/16 0832 11/28/16 1518   11/28/16 1000  vancomycin (VANCOCIN) IVPB 750 mg/150 ml premix  Status:  Discontinued     750 mg 150 mL/hr over 60 Minutes Intravenous Every 12 hours 11/28/16 0832 11/28/16 1518      Procedures: Echo 2/5 - Very limited image quality, however LVEF is severely impaired,   estimated at 25-30% with akinesis of all of the apical segments   and mid anteroseptal, anterior and anterolateral segments.   There is no thrombus on the contrast images.   A cardiac MRI is recommended for further LVEF evaluation.   CONSULTS:  cardiology and orthopedic surgery  Time spent: 25- minutes-Greater than 50% of this time was spent in counseling, explanation of diagnosis, planning of further management, and coordination of care.  MEDICATIONS: Scheduled Meds: . aspirin EC  81 mg Oral Daily  . atorvastatin  40 mg Oral QHS  . busPIRone  10 mg Oral BID  . divalproex  125 mg Oral TID  . famotidine  20 mg Oral BID  . feeding supplement (ENSURE ENLIVE)  237 mL Oral BID BM  . FLUoxetine  60 mg Oral Daily  . heparin subcutaneous  5,000 Units Subcutaneous Q8H  . hydrALAZINE  12.5 mg Oral q12n4p  . insulin aspart  0-9 Units Subcutaneous TID WC  . ipratropium-albuterol  3 mL Nebulization TID  . mouth rinse  15 mL Mouth Rinse  BID  . metoprolol succinate  25 mg Oral Daily  . nicotine  21 mg Transdermal Daily  . polyethylene glycol  17 g Oral Daily  . potassium chloride  20 mEq Oral Daily  . pregabalin  75 mg Oral Daily  . senna-docusate  1 tablet Oral BID   Continuous Infusions: . sodium chloride 10 mL/hr at 12/05/16 1945  . dextrose 40 mL/hr at 12/06/16 0609   PRN Meds:.acetaminophen, fentaNYL (SUBLIMAZE) injection, haloperidol lactate, methocarbamol, oxyCODONE-acetaminophen, RESOURCE THICKENUP CLEAR   PHYSICAL EXAM: Vital signs: Vitals:   12/06/16 0355 12/06/16 0400 12/06/16 0700 12/06/16 0847  BP:  (!) 136/93 124/87   Pulse: 95 95 94   Resp: 18  (!) 23   Temp: 97.8 F (36.6 C)  97.8 F (36.6 C)   TempSrc: Axillary  Oral   SpO2:  93% 92% 92%  Weight: 106.8 kg (235 lb 6.4 oz)     Height:       Filed Weights   12/04/16 0700 12/05/16 0401 12/06/16 0355  Weight: 104.7 kg (230 lb 13.2 oz) 106.5 kg (234 lb 11.2 oz) 106.8 kg (235 lb 6.4 oz)   Body mass index is 30.22 kg/m.   General appearance :Awake, alert, not in any distress. Looks very frail and slightly lethargic Eyes:, pupils equally reactive to light and accomodation,no scleral icterus. HEENT: Atraumatic and Normocephalic Neck: supple, no JVD. No cervical lymphadenopathy. Resp:Good air entry bilaterally, bilateral rales-transmitted upper airway sounds. CVS: S1 S2 regular GI: Bowel sounds present, Non tender and not distended with no gaurding, rigidity or rebound.No organomegaly Extremities: B/L Lower Ext shows no edema, both legs are warm to touch Neurology: Left hemiplegia Musculoskeletal:No digital cyanosis Skin:No Rash, warm and dry Wounds:N/A  I have personally reviewed following labs and imaging studies  LABORATORY DATA: CBC:  Recent Labs Lab 12/02/16 0342 12/03/16 0320 12/04/16 0236 12/05/16 0209 12/06/16 0151  WBC 16.9* 16.9* 21.6* 22.3* 18.1*  HGB 15.4 16.3 16.7 16.4 15.8  HCT 44.7 48.3 50.0 49.4 48.3  MCV 96.8  98.6 99.6 100.6* 101.7*  PLT 202 209 235 242 245    Basic Metabolic Panel:  Recent Labs Lab 11/30/16 0523  12/02/16 0342 12/03/16 0320 12/04/16 0236 12/05/16 0209 12/06/16 0151  NA 139  < > 142 147* 150* 151* 153*  K 3.9  < > 4.9 4.2 4.3 4.3 4.5  CL 101  < > 99* 101 106 109 109  CO2 24  < > 30 31 31 31 31   GLUCOSE 193*  < > 175* 179* 151* 200* 146*  BUN 43*  < > 61* 68* 86* 87* 88*  CREATININE 1.83*  < > 1.97* 2.11* 2.43* 2.32* 2.53*  CALCIUM 9.2  < > 9.6 9.5 9.7 9.5 9.2  MG 2.0  --   --   --   --   --   --   < > = values in this interval not displayed.  GFR: Estimated Creatinine Clearance: 39.9 mL/min (by C-G formula based on SCr of 2.53 mg/dL (H)).  Liver Function Tests: No  results for input(s): AST, ALT, ALKPHOS, BILITOT, PROT, ALBUMIN in the last 168 hours. No results for input(s): LIPASE, AMYLASE in the last 168 hours. No results for input(s): AMMONIA in the last 168 hours.  Coagulation Profile:  Recent Labs Lab 11/30/16 0523 12/01/16 0224 12/06/16 0151  INR 1.31 1.20 1.18    Cardiac Enzymes: No results for input(s): CKTOTAL, CKMB, CKMBINDEX, TROPONINI in the last 168 hours.  BNP (last 3 results) No results for input(s): PROBNP in the last 8760 hours.  HbA1C: No results for input(s): HGBA1C in the last 72 hours.  CBG:  Recent Labs Lab 12/05/16 0757 12/05/16 1127 12/05/16 1611 12/05/16 2134 12/06/16 0750  GLUCAP 158* 271* 215* 244* 195*    Lipid Profile: No results for input(s): CHOL, HDL, LDLCALC, TRIG, CHOLHDL, LDLDIRECT in the last 72 hours.  Thyroid Function Tests: No results for input(s): TSH, T4TOTAL, FREET4, T3FREE, THYROIDAB in the last 72 hours.  Anemia Panel: No results for input(s): VITAMINB12, FOLATE, FERRITIN, TIBC, IRON, RETICCTPCT in the last 72 hours.  Urine analysis:    Component Value Date/Time   COLORURINE YELLOW 11/27/2016 0355   APPEARANCEUR CLEAR 11/27/2016 0355   LABSPEC 1.015 11/27/2016 0355   PHURINE 6.0  11/27/2016 0355   GLUCOSEU NEGATIVE 11/27/2016 0355   HGBUR LARGE (A) 11/27/2016 0355   BILIRUBINUR NEGATIVE 11/27/2016 0355   KETONESUR NEGATIVE 11/27/2016 0355   PROTEINUR 100 (A) 11/27/2016 0355   NITRITE NEGATIVE 11/27/2016 0355   LEUKOCYTESUR NEGATIVE 11/27/2016 0355    Sepsis Labs: Lactic Acid, Venous    Component Value Date/Time   LATICACIDVEN 1.9 11/28/2016 2023    MICROBIOLOGY: Recent Results (from the past 240 hour(s))  Surgical pcr screen     Status: Abnormal   Collection Time: 11/27/16  3:58 PM  Result Value Ref Range Status   MRSA, PCR POSITIVE (A) NEGATIVE Final   Staphylococcus aureus POSITIVE (A) NEGATIVE Final    Comment:        The Xpert SA Assay (FDA approved for NASAL specimens in patients over 34 years of age), is one component of a comprehensive surveillance program.  Test performance has been validated by [facility] for patients greater than or equal to 35 year old. It is not intended to diagnose infection nor to guide or monitor treatment. RESULT CALLED TO, READ BACK BY AND VERIFIED WITH: C FOUSHEE,RN AT 1818 11/27/16 BY L BENFIELD   Culture, blood (routine x 2)     Status: None   Collection Time: 11/27/16  9:52 PM  Result Value Ref Range Status   Specimen Description BLOOD RIGHT HAND  Final   Special Requests BOTTLES DRAWN AEROBIC AND ANAEROBIC  5CC EA  Final   Culture NO GROWTH 5 DAYS  Final   Report Status 12/02/2016 FINAL  Final  Culture, blood (routine x 2)     Status: None   Collection Time: 11/27/16  9:52 PM  Result Value Ref Range Status   Specimen Description BLOOD RIGHT HAND  Final   Special Requests BOTTLES DRAWN AEROBIC AND ANAEROBIC  5CC EA  Final   Culture NO GROWTH 5 DAYS  Final   Report Status 12/03/2016 FINAL  Final    RADIOLOGY STUDIES/RESULTS: Dg Chest 1 View  Result Date: 11/27/2016 CLINICAL DATA:  Larey Seat today.  Left hip fracture. EXAM: CHEST 1 VIEW COMPARISON:  02/24/2016 FINDINGS: Diffuse interstitial coarsening is  probably chronic. No consolidation. No effusion. No pneumothorax. New unchanged hilar, mediastinal and cardiac contours. IMPRESSION: Chronic appearing interstitial coarsening. No consolidation  or effusion. Electronically Signed   By: Ellery Plunk M.D.   On: 11/27/2016 01:14   Dg Pelvis 1-2 Views  Result Date: 11/27/2016 CLINICAL DATA:  Larey Seat today while trying to get out of bed unassisted. EXAM: PELVIS - 1-2 VIEW COMPARISON:  None. FINDINGS: There is an acute subcapital left hip fracture. No dislocation. No bone lesion or bony destruction. Pelvis appears intact. IMPRESSION: Subcapital left hip fracture Electronically Signed   By: Ellery Plunk M.D.   On: 11/27/2016 01:13   Ct Head Wo Contrast  Result Date: 11/27/2016 CLINICAL DATA:  Larey Seat getting out of bed today, acute LEFT hip fracture. History of hypertension, stroke. EXAM: CT HEAD WITHOUT CONTRAST TECHNIQUE: Contiguous axial images were obtained from the base of the skull through the vertex without intravenous contrast. COMPARISON:  CT HEAD January 26, 2016 FINDINGS: BRAIN: No intraparenchymal hemorrhage, mass effect, midline shift or acute large vascular territory infarcts. Old RIGHT greater than LEFT cerebellar infarcts, severe brachium contrast atrophy. Old RIGHT greater than LEFT pontine infarcts. Old LEFT thalamus and bilateral basal ganglia lacunar infarcts. Old small RIGHT occipital lobe infarct. LEFT inferior temporal lobe encephalomalacia is likely posttraumatic. Mild to moderate white matter changes compatible with chronic small vessel ischemic disease. No abnormal extra-axial fluid collection. Moderate ventriculomegaly on the basis of global parenchymal brain volume loss. VASCULAR: Mild to moderate calcific atherosclerosis of the carotid siphons. SKULL: No skull fracture. No significant scalp soft tissue swelling. SINUSES/ORBITS: Bilateral maxillary mucosal retention cysts. Mastoid air cells are well aerated. The included ocular globes and  orbital contents are non-suspicious. OTHER: Patient is edentulous. IMPRESSION: No acute intracranial process. Stable examination including numerous old supra- and infratentorial infarcts predominately within posterior circulation, moderate global brain atrophy. Electronically Signed   By: Awilda Metro M.D.   On: 11/27/2016 03:01   Dg Chest Port 1 View  Result Date: 12/06/2016 CLINICAL DATA:  Shortness of breath . EXAM: PORTABLE CHEST 1 VIEW COMPARISON:  12/05/2016. FINDINGS: Cardiomegaly with progressive bilateral pulmonary infiltrates consistent with congestive heart failure and pulmonary edema. Bilateral pneumonia cannot be excluded . Small right pleural effusion cannot be excluded. No pneumothorax . Healed right clavicular fracture. IMPRESSION: 1. Cardiomegaly with progressive bilateral pulmonary infiltrates/ pulmonary edema. Findings consistent with congestive heart failure. Bilateral pneumonia cannot be excluded. Small right pleural effusion cannot be excluded. 2.  Low lung volumes. Electronically Signed   By: Maisie Fus  Register   On: 12/06/2016 06:50   Dg Chest Port 1 View  Result Date: 12/05/2016 CLINICAL DATA:  Shortness of breath . EXAM: PORTABLE CHEST 1 VIEW COMPARISON:  12/04/2016 . FINDINGS: Cardiomegaly with bilateral from interstitial prominence. No change from prior exam. No pleural effusion or pneumothorax P IMPRESSION: Cardiomegaly with bilateral mild interstitial prominence consistent interstitial edema again noted. No interim change from prior exam. Electronically Signed   By: Maisie Fus  Register   On: 12/05/2016 07:23   Dg Chest Port 1 View  Result Date: 12/04/2016 CLINICAL DATA:  Shortness of Breath EXAM: PORTABLE CHEST 1 VIEW COMPARISON:  12/03/2016 FINDINGS: Cardiomegaly. Mild interstitial prominence and vascular congestion. Question mild interstitial edema. No effusions or acute bony abnormality. IMPRESSION: Vascular congestion with interstitial prominence, question mild  interstitial edema. Electronically Signed   By: Charlett Nose M.D.   On: 12/04/2016 07:11   Dg Chest Port 1 View  Result Date: 12/03/2016 CLINICAL DATA:  Shortness of Breath EXAM: PORTABLE CHEST 1 VIEW COMPARISON:  12/02/2016 FINDINGS: Cardiac shadow is enlarged. Lungs are well aerated bilaterally. Vascular congestion is noted  without significant interstitial edema. No sizable effusion is noted. No bony abnormality is seen. IMPRESSION: Stable vascular congestion. Electronically Signed   By: Alcide Clever M.D.   On: 12/03/2016 15:20   Dg Chest Port 1 View  Result Date: 12/02/2016 CLINICAL DATA:  Dyspnea EXAM: PORTABLE CHEST 1 VIEW COMPARISON:  Chest radiograph from one day prior. FINDINGS: Slightly right rotated chest radiograph. Stable cardiomediastinal silhouette with mild cardiomegaly. No pneumothorax. No pleural effusion. Mild pulmonary edema, decreased. IMPRESSION: Mild congestive heart failure, improved. Electronically Signed   By: Delbert Phenix M.D.   On: 12/02/2016 08:06   Dg Chest Port 1 View  Addendum Date: 12/01/2016   ADDENDUM REPORT: 12/01/2016 08:08 ADDENDUM: There is an old healed right clavicle fracture, stable. Electronically Signed   By: Bretta Bang III M.D.   On: 12/01/2016 08:08   Result Date: 12/01/2016 CLINICAL DATA:  Shortness of Breath EXAM: PORTABLE CHEST 1 VIEW COMPARISON:  November 30, 2016 FINDINGS: Extensive airspace consolidation bilaterally, most severe in the left upper to mid lung zones, up remains without significant change. There is cardiomegaly with pulmonary venous hypertension. No adenopathy evident. No bone lesions. IMPRESSION: Widespread airspace consolidation bilaterally, somewhat more on the left than on the right. Evidence a degree of underlying pulmonary vascular congestion. Question multifocal pneumonia versus pulmonary edema; both entities may well exist concurrently. The overall appearance of the lungs and cardiomediastinal silhouette is stable compared to 1  day prior. Electronically Signed: By: Bretta Bang III M.D. On: 12/01/2016 08:02   Dg Chest Port 1 View  Result Date: 11/28/2016 CLINICAL DATA:  Shortness of breath. EXAM: PORTABLE CHEST 1 VIEW COMPARISON:  11/27/2016 and 02/24/2016 FINDINGS: The patient has progressive bilateral pulmonary infiltrates which may represent pulmonary edema or progressive pneumonia. Pulmonary vascularity appears normal in the overall heart size is normal. IMPRESSION: Probable progressive bilateral pneumonia. Electronically Signed   By: Francene Boyers M.D.   On: 11/28/2016 15:31   Dg Chest Port 1 View  Result Date: 11/27/2016 CLINICAL DATA:  sob EXAM: PORTABLE CHEST 1 VIEW COMPARISON:  11/27/2016 FINDINGS: The heart is enlarged. There are airspace filling opacities bilaterally, particularly well seen at the left lung base. Patient is rotated. IMPRESSION: Bilateral airspace filling opacities consistent with pulmonary edema and/or infectious process. Electronically Signed   By: Norva Pavlov M.D.   On: 11/27/2016 22:17   Dg Chest Port 1v Same Day  Result Date: 11/30/2016 CLINICAL DATA:  Shortness of breath, cough today, pneumonia, history hypertension, prior respiratory failure, coronary disease, chronic systolic CHF, ischemic cardiomyopathy EXAM: PORTABLE CHEST 1 VIEW COMPARISON:  Portable exam 0922 hours compared to 11/28/2016 FINDINGS: Rotated to the LEFT with kyphotic positioning. Enlargement of cardiac silhouette. Stable mediastinal contours for degree of rotation. Extensive BILATERAL pulmonary infiltrates favor pneumonia over edema. No pleural effusion or pneumothorax. Bones unremarkable. IMPRESSION: Persistent BILATERAL pulmonary infiltrates question pneumonia. Enlargement of cardiac silhouette. Electronically Signed   By: Ulyses Southward M.D.   On: 11/30/2016 09:31   Dg Swallowing Func-speech Pathology  Result Date: 11/30/2016 Objective Swallowing Evaluation: Type of Study: MBS-Modified Barium Swallow Study Patient  Details Name: AISEA BOULDIN MRN: [id number] Date of Birth: [birth year] Today's Date: 11/30/2016 Time: SLP Start Time (ACUTE ONLY): 1310-SLP Stop Time (ACUTE ONLY): 1325 SLP Time Calculation (min) (ACUTE ONLY): 15 min Past Medical History: Past Medical History: Diagnosis Date . Acute respiratory failure (HCC)   a. Spring 2017 following fall/splenectomy -->admitted to [facility]-->trach/g tube. . Anemia   a. 12/2015 ABL in setting  of fall/hematomas/splenic laceration req splenectomy. Marland Kitchen Apical mural thrombus   a. 11/2015 Echo: EF 25-30%, moderate size apical thrombus-->coumadin;  b. 12/2015 f/u Echo: EF 25-30%, ant/septa HK, no obvious large thrombus but cannot exclude small mural thrombus. Marland Kitchen CAD (coronary artery disease)   a. 12/2014 s/p BMS to the RCA. Marland Kitchen Cerebral infarction (HCC)  . Chronic systolic CHF (congestive heart failure) (HCC)   a. 12/2015 Echo: EF 25-30%. . Dementia  . Depression  . Dysphagia  . Falls   a. 11/2015 traumatic fall with resultant trauma req splenectomy and prolonged hospitalization complicated by resp failure. Marland Kitchen History of pneumonia  . Hyperlipidemia  . Hypertension  . Ischemic cardiomyopathy   a. 12/2015 Echo: EF 25-30%, anterior and septal HK. Marland Kitchen Left hemiplegia (HCC)  . Protein calorie malnutrition (HCC)  . Respiratory failure (HCC)  . Spleen injury  . Status post tracheostomy (HCC)   a. 01/2016 in setting of ongoing resp failure and aspiration. Past Surgical History: Past Surgical History: Procedure Laterality Date . CARDIAC CATHETERIZATION   . CORONARY ANGIOPLASTY WITH STENT PLACEMENT   . IR GENERIC HISTORICAL  07/22/2016  IR GASTROSTOMY TUBE REMOVAL 07/22/2016 [name], MD [facility]-INTERV RAD . PR LAP, SPLENECTOMY  01/07/2016 . TRACHEOSTOMY TUBE PLACEMENT N/A 02/22/2016  Procedure: TRACHEOSTOMY;  Surgeon: [name], MD;  Location: [facility] OR;  Service: ENT;  Laterality: N/A; HPI: Pt. with PMH of HTN, chronic systolic CHF, chronic respiratory failure on 3L of oxygen, diabetes mellitus,  recurrent fall, chronic anticoagulation, dyslipidemia; admitted on 11/26/2016, with complaint of A mechanical fall, was found to have closed left hip fracture.Patient developed aspiration pneumonia in the hospital. Currently surgery is on hold until pneumonia is better. Per MD pt was on dys 2/nectar thick liquids at SNF, but has been on thin liquids here. Pt had an MBS during admission to [facility] in 2017, but record not available. Pt also had a trach at that time. Reproted has a history of dysphagia due to prior CVAs.  No Data Recorded Assessment / Plan / Recommendation CHL IP CLINICAL IMPRESSIONS 11/30/2016 Therapy Diagnosis Severe pharyngeal phase dysphagia Clinical Impression Pt demonstrates a severe oropharygneal dysphagia with decreased duration and magnitude of hyolaryngeal excursion leading to severe oropharyngeal residuals that are aspirated with and without sensation in further swallow attempts. Base of tongue propulsion of bolus is also poor and epiglottis does not deflect for airway protection. Thin liquids pose a severe risk of aspiration and nectar thick liquids reduce severity of aspiration events, but are still likely to be aspirated due to increased residuals with thick viscosity. A chin tuck significantly improves bolus transit if fully tucked against chest. Would suspect that carry over for this will be poor, but will trial dys 3 solids and nectar thick liquids with full supervision for a chin tuck to determine if it is achievable.  Impact on safety and function Severe aspiration risk   [facility] IP TREATMENT RECOMMENDATION 11/30/2016 Treatment Recommendations Therapy as outlined in treatment plan below   Prognosis 11/30/2016 Prognosis for Safe Diet Advancement Guarded Barriers to Reach Goals Severity of deficits;Time post onset Barriers/Prognosis Comment -- [facility] IP DIET RECOMMENDATION 11/30/2016 SLP Diet Recommendations Dysphagia 3 (Mech soft) solids;Nectar thick liquid Liquid Administration via  Cup;Straw Medication Administration Whole meds with puree Compensations Chin tuck;Slow rate;Small sips/bites;Minimize environmental distractions Postural Changes Remain semi-upright after after feeds/meals (Comment);Seated upright at 90 degrees   [facility] IP OTHER RECOMMENDATIONS 11/30/2016 Recommended Consults -- Oral Care Recommendations Oral care BID Other Recommendations Order thickener from  pharmacy   [facility] IP FOLLOW UP RECOMMENDATIONS 11/30/2016 Follow up Recommendations Skilled Nursing facility   [facility] IP FREQUENCY AND DURATION 11/30/2016 Speech Therapy Frequency (ACUTE ONLY) min 2x/week Treatment Duration 2 weeks      [facility] IP ORAL PHASE 11/30/2016 Oral Phase WFL Oral - Pudding Teaspoon -- Oral - Pudding Cup -- Oral - Honey Teaspoon -- Oral - Honey Cup -- Oral - Nectar Teaspoon -- Oral - Nectar Cup -- Oral - Nectar Straw -- Oral - Thin Teaspoon -- Oral - Thin Cup -- Oral - Thin Straw -- Oral - Puree -- Oral - Mech Soft -- Oral - Regular -- Oral - Multi-Consistency -- Oral - Pill -- Oral Phase - Comment --  [facility] IP PHARYNGEAL PHASE 11/30/2016 Pharyngeal Phase Impaired Pharyngeal- Pudding Teaspoon -- Pharyngeal -- Pharyngeal- Pudding Cup -- Pharyngeal -- Pharyngeal- Honey Teaspoon -- Pharyngeal -- Pharyngeal- Honey Cup -- Pharyngeal -- Pharyngeal- Nectar Teaspoon -- Pharyngeal -- Pharyngeal- Nectar Cup Reduced epiglottic inversion;Reduced anterior laryngeal mobility;Reduced airway/laryngeal closure;Reduced tongue base retraction;Delayed swallow initiation-pyriform sinuses;Pharyngeal residue - valleculae;Pharyngeal residue - pyriform;Compensatory strategies attempted (with notebox) Pharyngeal -- Pharyngeal- Nectar Straw Reduced epiglottic inversion;Reduced anterior laryngeal mobility;Reduced airway/laryngeal closure;Reduced tongue base retraction;Delayed swallow initiation-pyriform sinuses;Pharyngeal residue - valleculae;Pharyngeal residue - pyriform;Compensatory strategies attempted (with notebox);Other (Comment) Pharyngeal --  Pharyngeal- Thin Teaspoon -- Pharyngeal -- Pharyngeal- Thin Cup Reduced epiglottic inversion;Reduced anterior laryngeal mobility;Reduced airway/laryngeal closure;Reduced tongue base retraction;Delayed swallow initiation-pyriform sinuses;Pharyngeal residue - valleculae;Pharyngeal residue - pyriform;Penetration/Apiration after swallow;Significant aspiration (Amount) Pharyngeal Material enters airway, passes BELOW cords and not ejected out despite cough attempt by patient Pharyngeal- Thin Straw Reduced epiglottic inversion;Reduced anterior laryngeal mobility;Reduced airway/laryngeal closure;Reduced tongue base retraction;Delayed swallow initiation-pyriform sinuses;Pharyngeal residue - valleculae;Pharyngeal residue - pyriform;Compensatory strategies attempted (with notebox);Penetration/Aspiration during swallow Pharyngeal Material enters airway, passes BELOW cords without attempt by patient to eject out (silent aspiration) Pharyngeal- Puree Reduced epiglottic inversion;Reduced anterior laryngeal mobility;Reduced airway/laryngeal closure;Reduced tongue base retraction;Pharyngeal residue - valleculae;Pharyngeal residue - pyriform;Compensatory strategies attempted (with notebox);Delayed swallow initiation-vallecula Pharyngeal -- Pharyngeal- Mechanical Soft Reduced epiglottic inversion;Reduced anterior laryngeal mobility;Reduced airway/laryngeal closure;Reduced tongue base retraction;Pharyngeal residue - valleculae;Pharyngeal residue - pyriform;Compensatory strategies attempted (with notebox);Delayed swallow initiation-vallecula Pharyngeal -- Pharyngeal- Regular -- Pharyngeal -- Pharyngeal- Multi-consistency -- Pharyngeal -- Pharyngeal- Pill Reduced epiglottic inversion;Reduced anterior laryngeal mobility;Reduced airway/laryngeal closure;Reduced tongue base retraction;Pharyngeal residue - valleculae;Pharyngeal residue - pyriform;Compensatory strategies attempted (with notebox);Delayed swallow initiation-vallecula Pharyngeal  -- Pharyngeal Comment --  No flowsheet data found. [facility] IP GO 11/29/2016 Functional Assessment Tool Used clinical judgement Functional Limitations Swallowing Swallow Current Status [phone]) CJ Swallow Goal Status (W0981) CJ Swallow Discharge Status (X9147) (None) Motor Speech Current Status [phone]) (None) Motor Speech Goal Status (O1308) (None) Motor Speech Goal Status (M5784) (None) Spoken Language Comprehension Current Status (O9629) (None) Spoken Language Comprehension Goal Status (B2841) (None) Spoken Language Comprehension Discharge Status [phone]) (None) Spoken Language Expression Current Status [phone]) (None) Spoken Language Expression Goal Status [phone]) (None) Spoken Language Expression Discharge Status [phone]) (None) Attention Current Status (Q2595) (None) Attention Goal Status (G3875) (None) Attention Discharge Status [phone]) (None) Memory Current Status (R5188) (None) Memory Goal Status (C1660) (None) Memory Discharge Status (Y3016) (None) Voice Current Status (W1093) (None) Voice Goal Status (A3557) (None) Voice Discharge Status (D2202) (None) Other Speech-Language Pathology Functional Limitation [phone]) (None) Other Speech-Language Pathology Functional Limitation Goal Status (W2376) (None) Other Speech-Language Pathology Functional Limitation Discharge Status [phone]) (None) Harlon Ditty, MA CCC-SLP [id number] [place], Riley Nearing 11/30/2016, 2:50 PM              Dg  Femur Min 2 Views Left  Result Date: 11/27/2016 CLINICAL DATA:  Larey Seat while attempting to get out of bed unassisted today. EXAM: LEFT FEMUR 2 VIEWS COMPARISON:  None. FINDINGS: There is a subcapital left hip fracture. Remainder of the femur is intact. No dislocation. No bone lesion or bony destruction. IMPRESSION: Subcapital left hip fracture Electronically Signed   By: Ellery Plunk M.D.   On: 11/27/2016 01:13     LOS: 9 days   Jeoffrey Massed, MD  [facility] Pager:[phone]  If 7PM-7AM, please contact  night-coverage [email or web address] Password [facility] 12/06/2016, 10:33 AM

## 2016-12-07 ENCOUNTER — Inpatient Hospital Stay (HOSPITAL_COMMUNITY): Payer: Medicare Other

## 2016-12-07 DIAGNOSIS — Z66 Do not resuscitate: Secondary | ICD-10-CM

## 2016-12-07 LAB — CBC
HEMATOCRIT: 49.3 % (ref 39.0–52.0)
HEMOGLOBIN: 16.1 g/dL (ref 13.0–17.0)
MCH: 33.2 pg (ref 26.0–34.0)
MCHC: 32.7 g/dL (ref 30.0–36.0)
MCV: 101.6 fL — AB (ref 78.0–100.0)
Platelets: 227 10*3/uL (ref 150–400)
RBC: 4.85 MIL/uL (ref 4.22–5.81)
RDW: 16.4 % — ABNORMAL HIGH (ref 11.5–15.5)
WBC: 27.6 10*3/uL — ABNORMAL HIGH (ref 4.0–10.5)

## 2016-12-07 LAB — GLUCOSE, CAPILLARY
Glucose-Capillary: 206 mg/dL — ABNORMAL HIGH (ref 65–99)
Glucose-Capillary: 195 mg/dL — ABNORMAL HIGH (ref 65–99)
Glucose-Capillary: 222 mg/dL — ABNORMAL HIGH (ref 65–99)
Glucose-Capillary: 307 mg/dL — ABNORMAL HIGH (ref 65–99)

## 2016-12-07 LAB — BASIC METABOLIC PANEL
Anion gap: 12 (ref 5–15)
BUN: 86 mg/dL — AB (ref 6–20)
CHLORIDE: 110 mmol/L (ref 101–111)
CO2: 31 mmol/L (ref 22–32)
CREATININE: 2.69 mg/dL — AB (ref 0.61–1.24)
Calcium: 9.1 mg/dL (ref 8.9–10.3)
GFR calc Af Amer: 28 mL/min — ABNORMAL LOW (ref >60)
GFR calc non Af Amer: 24 mL/min — ABNORMAL LOW (ref >60)
GLUCOSE: 207 mg/dL — AB (ref 65–99)
Potassium: 4.6 mmol/L (ref 3.5–5.1)
SODIUM: 153 mmol/L — AB (ref 135–145)

## 2016-12-07 NOTE — Progress Notes (Signed)
Sister has been giving patient thin liquids because she said its what they gave him at the nursing home and he didn't cough. She was informed that he is sliently aspirating.  Landry Dyke RN BSN MSN 12/07/2016

## 2016-12-07 NOTE — Progress Notes (Addendum)
amion                        PROGRESS NOTE        PATIENT DETAILS Name: Curtis Keller Age: 61 y.o. Sex: male Date of Birth: [birth year] Admit Date: 11/26/2016 Admitting Physician Clydie Braun, MD ZOX:[id number], Maxine Glenn, DO  Brief Narrative: Patient is a 61 y.o. male with history chronic systolic heart failure (EF 25-30%), history of LV thrombus, prior CVA with chronic left-sided hemiplegia-multiple falls over the past few years causing splenic rupture-presented to the hospital on 2/3 following a mechanical fall, found to have a left hip fracture. Unfortunately hospital course has been complicated by development of acute hypoxic respiratory failure, felt due to aspiration PNA vs acute systolic CHF.   Subjective: Slowly continues to deteriorate-"gurgling" his secretions this am. Looks very frail and weak  Assessment/Plan: Closed Left hip fracture: Secondary to a mechanical fall-unfortunately before hip repair could be attempted-patient developed hypoxic resp failure due to aspiration pneumonia and acute systolic heart failure. Although he initially improved with antibiotics and Lasix, his oxygen requirements have again gone up-he is back to 5-6 L of oxygen today. He sounds more congested today, and is pulling his secretions.  Palliative care has been following and has been engaging family members. I had a long conversation with the patient's sister at bedside, and then with his daughter in [place]. Given slow deterioration, worsening kidney function and hypernatremia, overtly aspirating-and with severe failure to thrive syndrome, I have recommended that we start initiating comfort measures, I have recommended that we put a DO NOT RESUSCITATE order in the chart. Patient's daughter, understands this difficult situation-and poor overall prognoses, she is going to touch base with her aunt over the phone and will get back to [place]. Please note, palliative care following very closely-in fact in the room with  me while I was discussing with the patient's sister, subsequently both this M.D. and Harrold Donath NP from the palliative care team spoke with the patient's daughter over the phone.  Acute hypoxemic respiratory failure secondary to aspiration pneumonia and acute on chronic systolic heart failure: Patient had a aspiration event on 2/5 requiring transfer to stepdown and 100% nonrebreather mask. He was subsequently treated with IV antibiotics and IV Lasix. He initially improved and his oxygen was titrated down to 3 L via nasal cannula. He unfortunately seems to have worsened over the past few days, and is overtly aspirating. He remains on empiric Unasyn. See above regarding poor prognosis, and end-of-life discussion.  Acute kidney injury: Suspect hemodynamic mediated acute kidney injury due to ongoing aspiration pneumonitis, congestive heart failure, diuretic use. Creatinine still elevated despite gentle hydration. Continue to follow electrolytes.   Hypernatremia: probably due to decrease in oral intake and diuretic use-difficult situation-since on exam volume status remains stable, we will continue to cautiously hydrate with D5W.  Dysphagia: Speech therapy following, underwent modified barium swallow-this is a chronic issue-patient has a prior history of CVA-he previously had a tube in place. Recommendations are to try a dysphagia 3 solids with nectar thick liquids. Spoke with patient's sister over the phone on 2/10, she is aware of the aspiration risk and wishes to continue with Dys 3 diet with nectar thick liquid. Unfortunately, per RN-patient's sister has been occasionally giving patient thin liquids.  History of left apical thrombus: Previously on Coumadin-currently on hold for surgery. Given high risk for venous thromboembolism post surgery, suspect will require short-term anticoagulation. Long discussion with patient's  sister at bedside on 2/7, she would prefer short-term anticoagulation and is willing  to accept all risks.  Acute metabolic encephalopathy/dementia: Mental status continues to wax and wane-at times he is completely awake and alert and at times he is confused.   Leukocytosis: Initially thought to be secondary to steroids, but white count continues to worsen-likely secondary to aspiration pneumonia. Remains on Unasyn  Hx of COPD: not in exacerbation-see above.   History of CVA with chronic left sided weakness: Previously on anticoagulation-left-sided deficits are unchanged. Continue statin. See above regarding anticoagulation  GERD: Continue Pepcid  Type 2 diabetes: CBGs stable-continue SSI  History of anxiety/depression/mild dementia: Continue Depakote, fluoxetine and BuSpar.  Frequent falls: Has had frequent falls in the past few years-one episode of fall resulted in a splenic laceration requiring splenectomy. Unfortunately, patient also has a history of LV thrombus and a CVA with left hemiplegia. Although he is a poor long term candidate, he is at significant risk of having venous thromboembolism post-hip surgery. Long discussion with the patient's sister on 2/7(who is a nurse at bedside)-she is agreeable to restart anticoagulation post operatively for the short-term.  Palliative care: Remains a full code, poor long-term prognosis-multiple medical comorbidities-and continues to deteriorate in spite of prolonged hospital stay. He is now activity and overtly aspirating-he is pooling his secretions. Have an extensive discussion with the sister in the room, and subsequently with his daughter over the phone. Family discussion in progress regarding DO NOT RESUSCITATE and initiation of comfort measures. See above discussion. Palliative care continues to follow  DVT Prophylaxis: Prophylactic Heparin   Code Status: Full code   Family Communication: Extensive discussion with the patient's sister at bedside and patient's daughter who lives in [place] over the phone.  Disposition  Plan: Remain inpatient-remain in SDU-SNF vs residential hospice on discharge  Antimicrobial agents: Anti-infectives    Start     Dose/Rate Route Frequency Ordered Stop   12/06/16 1230  Ampicillin-Sulbactam (UNASYN) 3 g in sodium chloride 0.9 % 100 mL IVPB     3 g 200 mL/hr over 30 Minutes Intravenous Every 6 hours 12/06/16 1139     11/28/16 1530  piperacillin-tazobactam (ZOSYN) IVPB 3.375 g     3.375 g 12.5 mL/hr over 240 Minutes Intravenous Every 8 hours 11/28/16 1518 12/04/16 2359   11/28/16 1530  vancomycin (VANCOCIN) IVPB 750 mg/150 ml premix  Status:  Discontinued     750 mg 150 mL/hr over 60 Minutes Intravenous Every 12 hours 11/28/16 1518 12/01/16 1031   11/28/16 1520  ceFAZolin (ANCEF) IVPB 2g/100 mL premix  Status:  Discontinued     2 g 200 mL/hr over 30 Minutes Intravenous To [facility] 11/28/16 1247 11/28/16 2334   11/28/16 1000  piperacillin-tazobactam (ZOSYN) IVPB 3.375 g  Status:  Discontinued     3.375 g 12.5 mL/hr over 240 Minutes Intravenous Every 8 hours 11/28/16 0832 11/28/16 1518   11/28/16 1000  vancomycin (VANCOCIN) IVPB 750 mg/150 ml premix  Status:  Discontinued     750 mg 150 mL/hr over 60 Minutes Intravenous Every 12 hours 11/28/16 0832 11/28/16 1518      Procedures: Echo 2/5 - Very limited image quality, however LVEF is severely impaired,   estimated at 25-30% with akinesis of all of the apical segments   and mid anteroseptal, anterior and anterolateral segments.   There is no thrombus on the contrast images.   A cardiac MRI is recommended for further LVEF evaluation.   CONSULTS:  cardiology and orthopedic surgery  Time spent: 64- minutes-Greater than 50% of this time was spent in counseling, explanation of diagnosis, planning of further management, and coordination of care.  MEDICATIONS: Scheduled Meds: . ampicillin-sulbactam (UNASYN) IV  3 g Intravenous Q6H  . aspirin EC  81 mg Oral Daily  . atorvastatin  40 mg Oral QHS  . busPIRone   10 mg Oral BID  . divalproex  125 mg Oral TID  . famotidine  20 mg Oral BID  . feeding supplement (ENSURE ENLIVE)  237 mL Oral BID BM  . FLUoxetine  60 mg Oral Daily  . heparin subcutaneous  5,000 Units Subcutaneous Q8H  . hydrALAZINE  12.5 mg Oral q12n4p  . insulin aspart  0-9 Units Subcutaneous TID WC  . ipratropium-albuterol  3 mL Nebulization TID  . mouth rinse  15 mL Mouth Rinse BID  . metoprolol succinate  25 mg Oral Daily  . nicotine  21 mg Transdermal Daily  . polyethylene glycol  17 g Oral Daily  . potassium chloride  20 mEq Oral Daily  . pregabalin  75 mg Oral Daily  . senna-docusate  1 tablet Oral BID   Continuous Infusions: . sodium chloride 10 mL/hr at 12/05/16 1945   PRN Meds:.acetaminophen, fentaNYL (SUBLIMAZE) injection, haloperidol lactate, methocarbamol, oxyCODONE-acetaminophen, RESOURCE THICKENUP CLEAR   PHYSICAL EXAM: Vital signs: Vitals:   12/07/16 0431 12/07/16 0816 12/07/16 0836 12/07/16 1210  BP: 133/89 (!) 128/95  121/75  Pulse: (!) 110 87  (!) 46  Resp: (!) 30 14  (!) 24  Temp: 99.5 F (37.5 C) 99.6 F (37.6 C)  99.7 F (37.6 C)  TempSrc: Oral Oral  Oral  SpO2: 94% 98% 95% 90%  Weight: 106.6 kg (235 lb 1.6 oz)     Height:       Filed Weights   12/05/16 0401 12/06/16 0355 12/07/16 0431  Weight: 106.5 kg (234 lb 11.2 oz) 106.8 kg (235 lb 6.4 oz) 106.6 kg (235 lb 1.6 oz)   Body mass index is 30.19 kg/m.   General appearance :Awake, alert, not in any distress. Looks very frail and slightly lethargic. "Gurgling" Eyes:, pupils equally reactive to light and accomodation,no scleral icterus. HEENT: Atraumatic and Normocephalic Neck: supple, no JVD. No cervical lymphadenopathy. Resp:Good air entry bilaterally, rales all over upper airway sounds. CVS: S1 S2 regular GI: Bowel sounds present, Non tender and not distended with no gaurding, rigidity or rebound.No organomegaly Extremities: B/L Lower Ext shows no edema, both legs are warm to  touch Neurology: Left hemiplegia Musculoskeletal:No digital cyanosis Skin:No Rash, warm and dry Wounds:N/A  I have personally reviewed following labs and imaging studies  LABORATORY DATA: CBC:  Recent Labs Lab 12/03/16 0320 12/04/16 0236 12/05/16 0209 12/06/16 0151 12/07/16 0312  WBC 16.9* 21.6* 22.3* 18.1* 27.6*  HGB 16.3 16.7 16.4 15.8 16.1  HCT 48.3 50.0 49.4 48.3 49.3  MCV 98.6 99.6 100.6* 101.7* 101.6*  PLT 209 235 242 245 227    Basic Metabolic Panel:  Recent Labs Lab 12/03/16 0320 12/04/16 0236 12/05/16 0209 12/06/16 0151 12/07/16 0312  NA 147* 150* 151* 153* 153*  K 4.2 4.3 4.3 4.5 4.6  CL 101 106 109 109 110  CO2 31 31 31 31 31   GLUCOSE 179* 151* 200* 146* 207*  BUN 68* 86* 87* 88* 86*  CREATININE 2.11* 2.43* 2.32* 2.53* 2.69*  CALCIUM 9.5 9.7 9.5 9.2 9.1    GFR: Estimated Creatinine Clearance: 37.5 mL/min (by C-G formula based on SCr of 2.69 mg/dL (H)).  Liver Function  Tests: No results for input(s): AST, ALT, ALKPHOS, BILITOT, PROT, ALBUMIN in the last 168 hours. No results for input(s): LIPASE, AMYLASE in the last 168 hours. No results for input(s): AMMONIA in the last 168 hours.  Coagulation Profile:  Recent Labs Lab 12/01/16 0224 12/06/16 0151  INR 1.20 1.18    Cardiac Enzymes: No results for input(s): CKTOTAL, CKMB, CKMBINDEX, TROPONINI in the last 168 hours.  BNP (last 3 results) No results for input(s): PROBNP in the last 8760 hours.  HbA1C: No results for input(s): HGBA1C in the last 72 hours.  CBG:  Recent Labs Lab 12/06/16 1150 12/06/16 1629 12/06/16 2210 12/07/16 0816 12/07/16 1209  GLUCAP 144* 171* 229* 206* 195*    Lipid Profile: No results for input(s): CHOL, HDL, LDLCALC, TRIG, CHOLHDL, LDLDIRECT in the last 72 hours.  Thyroid Function Tests: No results for input(s): TSH, T4TOTAL, FREET4, T3FREE, THYROIDAB in the last 72 hours.  Anemia Panel: No results for input(s): VITAMINB12, FOLATE, FERRITIN, TIBC,  IRON, RETICCTPCT in the last 72 hours.  Urine analysis:    Component Value Date/Time   COLORURINE YELLOW 11/27/2016 0355   APPEARANCEUR CLEAR 11/27/2016 0355   LABSPEC 1.015 11/27/2016 0355   PHURINE 6.0 11/27/2016 0355   GLUCOSEU NEGATIVE 11/27/2016 0355   HGBUR LARGE (A) 11/27/2016 0355   BILIRUBINUR NEGATIVE 11/27/2016 0355   KETONESUR NEGATIVE 11/27/2016 0355   PROTEINUR 100 (A) 11/27/2016 0355   NITRITE NEGATIVE 11/27/2016 0355   LEUKOCYTESUR NEGATIVE 11/27/2016 0355    Sepsis Labs: Lactic Acid, Venous    Component Value Date/Time   LATICACIDVEN 1.9 11/28/2016 2023    MICROBIOLOGY: Recent Results (from the past 240 hour(s))  Surgical pcr screen     Status: Abnormal   Collection Time: 11/27/16  3:58 PM  Result Value Ref Range Status   MRSA, PCR POSITIVE (A) NEGATIVE Final   Staphylococcus aureus POSITIVE (A) NEGATIVE Final    Comment:        The Xpert SA Assay (FDA approved for NASAL specimens in patients over 23 years of age), is one component of a comprehensive surveillance program.  Test performance has been validated by [facility] for patients greater than or equal to 15 year old. It is not intended to diagnose infection nor to guide or monitor treatment. RESULT CALLED TO, READ BACK BY AND VERIFIED WITH: C FOUSHEE,RN AT 1818 11/27/16 BY L BENFIELD   Culture, blood (routine x 2)     Status: None   Collection Time: 11/27/16  9:52 PM  Result Value Ref Range Status   Specimen Description BLOOD RIGHT HAND  Final   Special Requests BOTTLES DRAWN AEROBIC AND ANAEROBIC  5CC EA  Final   Culture NO GROWTH 5 DAYS  Final   Report Status 12/02/2016 FINAL  Final  Culture, blood (routine x 2)     Status: None   Collection Time: 11/27/16  9:52 PM  Result Value Ref Range Status   Specimen Description BLOOD RIGHT HAND  Final   Special Requests BOTTLES DRAWN AEROBIC AND ANAEROBIC  5CC EA  Final   Culture NO GROWTH 5 DAYS  Final   Report Status 12/03/2016 FINAL  Final     RADIOLOGY STUDIES/RESULTS: Dg Chest 1 View  Result Date: 11/27/2016 CLINICAL DATA:  Larey Seat today.  Left hip fracture. EXAM: CHEST 1 VIEW COMPARISON:  02/24/2016 FINDINGS: Diffuse interstitial coarsening is probably chronic. No consolidation. No effusion. No pneumothorax. New unchanged hilar, mediastinal and cardiac contours. IMPRESSION: Chronic appearing interstitial coarsening. No consolidation or  effusion. Electronically Signed   By: Ellery Plunk M.D.   On: 11/27/2016 01:14   Dg Pelvis 1-2 Views  Result Date: 11/27/2016 CLINICAL DATA:  Larey Seat today while trying to get out of bed unassisted. EXAM: PELVIS - 1-2 VIEW COMPARISON:  None. FINDINGS: There is an acute subcapital left hip fracture. No dislocation. No bone lesion or bony destruction. Pelvis appears intact. IMPRESSION: Subcapital left hip fracture Electronically Signed   By: Ellery Plunk M.D.   On: 11/27/2016 01:13   Ct Head Wo Contrast  Result Date: 11/27/2016 CLINICAL DATA:  Larey Seat getting out of bed today, acute LEFT hip fracture. History of hypertension, stroke. EXAM: CT HEAD WITHOUT CONTRAST TECHNIQUE: Contiguous axial images were obtained from the base of the skull through the vertex without intravenous contrast. COMPARISON:  CT HEAD January 26, 2016 FINDINGS: BRAIN: No intraparenchymal hemorrhage, mass effect, midline shift or acute large vascular territory infarcts. Old RIGHT greater than LEFT cerebellar infarcts, severe brachium contrast atrophy. Old RIGHT greater than LEFT pontine infarcts. Old LEFT thalamus and bilateral basal ganglia lacunar infarcts. Old small RIGHT occipital lobe infarct. LEFT inferior temporal lobe encephalomalacia is likely posttraumatic. Mild to moderate white matter changes compatible with chronic small vessel ischemic disease. No abnormal extra-axial fluid collection. Moderate ventriculomegaly on the basis of global parenchymal brain volume loss. VASCULAR: Mild to moderate calcific atherosclerosis of the  carotid siphons. SKULL: No skull fracture. No significant scalp soft tissue swelling. SINUSES/ORBITS: Bilateral maxillary mucosal retention cysts. Mastoid air cells are well aerated. The included ocular globes and orbital contents are non-suspicious. OTHER: Patient is edentulous. IMPRESSION: No acute intracranial process. Stable examination including numerous old supra- and infratentorial infarcts predominately within posterior circulation, moderate global brain atrophy. Electronically Signed   By: Awilda Metro M.D.   On: 11/27/2016 03:01   Dg Chest Port 1 View  Result Date: 12/07/2016 CLINICAL DATA:  61 year old male with shortness of breath. Recent fall. Initial encounter. EXAM: PORTABLE CHEST 1 VIEW COMPARISON:  12/06/2016 and earlier. FINDINGS: Portable AP semi upright view at 0515 hours. Stable cardiomegaly and mediastinal contours. Widespread increased pulmonary interstitial markings are chronic to a degree. No superimposed pneumothorax or pulmonary edema. No pleural effusion is evident. Increased patchy retrocardiac opacity since 11/27/2016. Chronic right clavicle deformity. IMPRESSION: Cardiomegaly and chronic lung disease. Left lung base opacity has increased since 11/27/2016, suspicious for left lung base pneumonia. No pleural effusion identified. Electronically Signed   By: Odessa Fleming M.D.   On: 12/07/2016 07:43   Dg Chest Port 1 View  Result Date: 12/06/2016 CLINICAL DATA:  Shortness of breath . EXAM: PORTABLE CHEST 1 VIEW COMPARISON:  12/05/2016. FINDINGS: Cardiomegaly with progressive bilateral pulmonary infiltrates consistent with congestive heart failure and pulmonary edema. Bilateral pneumonia cannot be excluded . Small right pleural effusion cannot be excluded. No pneumothorax . Healed right clavicular fracture. IMPRESSION: 1. Cardiomegaly with progressive bilateral pulmonary infiltrates/ pulmonary edema. Findings consistent with congestive heart failure. Bilateral pneumonia cannot be  excluded. Small right pleural effusion cannot be excluded. 2.  Low lung volumes. Electronically Signed   By: Maisie Fus  Register   On: 12/06/2016 06:50   Dg Chest Port 1 View  Result Date: 12/05/2016 CLINICAL DATA:  Shortness of breath . EXAM: PORTABLE CHEST 1 VIEW COMPARISON:  12/04/2016 . FINDINGS: Cardiomegaly with bilateral from interstitial prominence. No change from prior exam. No pleural effusion or pneumothorax P IMPRESSION: Cardiomegaly with bilateral mild interstitial prominence consistent interstitial edema again noted. No interim change from prior exam. Electronically Signed  ByMaisie Fus  Register   On: 12/05/2016 07:23   Dg Chest Port 1 View  Result Date: 12/04/2016 CLINICAL DATA:  Shortness of Breath EXAM: PORTABLE CHEST 1 VIEW COMPARISON:  12/03/2016 FINDINGS: Cardiomegaly. Mild interstitial prominence and vascular congestion. Question mild interstitial edema. No effusions or acute bony abnormality. IMPRESSION: Vascular congestion with interstitial prominence, question mild interstitial edema. Electronically Signed   By: Charlett Nose M.D.   On: 12/04/2016 07:11   Dg Chest Port 1 View  Result Date: 12/03/2016 CLINICAL DATA:  Shortness of Breath EXAM: PORTABLE CHEST 1 VIEW COMPARISON:  12/02/2016 FINDINGS: Cardiac shadow is enlarged. Lungs are well aerated bilaterally. Vascular congestion is noted without significant interstitial edema. No sizable effusion is noted. No bony abnormality is seen. IMPRESSION: Stable vascular congestion. Electronically Signed   By: Alcide Clever M.D.   On: 12/03/2016 15:20   Dg Chest Port 1 View  Result Date: 12/02/2016 CLINICAL DATA:  Dyspnea EXAM: PORTABLE CHEST 1 VIEW COMPARISON:  Chest radiograph from one day prior. FINDINGS: Slightly right rotated chest radiograph. Stable cardiomediastinal silhouette with mild cardiomegaly. No pneumothorax. No pleural effusion. Mild pulmonary edema, decreased. IMPRESSION: Mild congestive heart failure, improved.  Electronically Signed   By: Delbert Phenix M.D.   On: 12/02/2016 08:06   Dg Chest Port 1 View  Addendum Date: 12/01/2016   ADDENDUM REPORT: 12/01/2016 08:08 ADDENDUM: There is an old healed right clavicle fracture, stable. Electronically Signed   By: Bretta Bang III M.D.   On: 12/01/2016 08:08   Result Date: 12/01/2016 CLINICAL DATA:  Shortness of Breath EXAM: PORTABLE CHEST 1 VIEW COMPARISON:  November 30, 2016 FINDINGS: Extensive airspace consolidation bilaterally, most severe in the left upper to mid lung zones, up remains without significant change. There is cardiomegaly with pulmonary venous hypertension. No adenopathy evident. No bone lesions. IMPRESSION: Widespread airspace consolidation bilaterally, somewhat more on the left than on the right. Evidence a degree of underlying pulmonary vascular congestion. Question multifocal pneumonia versus pulmonary edema; both entities may well exist concurrently. The overall appearance of the lungs and cardiomediastinal silhouette is stable compared to 1 day prior. Electronically Signed: By: Bretta Bang III M.D. On: 12/01/2016 08:02   Dg Chest Port 1 View  Result Date: 11/28/2016 CLINICAL DATA:  Shortness of breath. EXAM: PORTABLE CHEST 1 VIEW COMPARISON:  11/27/2016 and 02/24/2016 FINDINGS: The patient has progressive bilateral pulmonary infiltrates which may represent pulmonary edema or progressive pneumonia. Pulmonary vascularity appears normal in the overall heart size is normal. IMPRESSION: Probable progressive bilateral pneumonia. Electronically Signed   By: Francene Boyers M.D.   On: 11/28/2016 15:31   Dg Chest Port 1 View  Result Date: 11/27/2016 CLINICAL DATA:  sob EXAM: PORTABLE CHEST 1 VIEW COMPARISON:  11/27/2016 FINDINGS: The heart is enlarged. There are airspace filling opacities bilaterally, particularly well seen at the left lung base. Patient is rotated. IMPRESSION: Bilateral airspace filling opacities consistent with pulmonary edema  and/or infectious process. Electronically Signed   By: Norva Pavlov M.D.   On: 11/27/2016 22:17   Dg Chest Port 1v Same Day  Result Date: 11/30/2016 CLINICAL DATA:  Shortness of breath, cough today, pneumonia, history hypertension, prior respiratory failure, coronary disease, chronic systolic CHF, ischemic cardiomyopathy EXAM: PORTABLE CHEST 1 VIEW COMPARISON:  Portable exam 0922 hours compared to 11/28/2016 FINDINGS: Rotated to the LEFT with kyphotic positioning. Enlargement of cardiac silhouette. Stable mediastinal contours for degree of rotation. Extensive BILATERAL pulmonary infiltrates favor pneumonia over edema. No pleural effusion or pneumothorax. Bones unremarkable.  IMPRESSION: Persistent BILATERAL pulmonary infiltrates question pneumonia. Enlargement of cardiac silhouette. Electronically Signed   By: Ulyses Southward M.D.   On: 11/30/2016 09:31   Dg Swallowing Func-speech Pathology  Result Date: 11/30/2016 Objective Swallowing Evaluation: Type of Study: MBS-Modified Barium Swallow Study Patient Details Name: Curtis Keller MRN: [id number] Date of Birth: 09/26/1956 Today's Date: 11/30/2016 Time: SLP Start Time (ACUTE ONLY): 1310-SLP Stop Time (ACUTE ONLY): 1325 SLP Time Calculation (min) (ACUTE ONLY): 15 min Past Medical History: Past Medical History: Diagnosis Date . Acute respiratory failure (HCC)   a. Spring 2017 following fall/splenectomy -->admitted to [facility]-->trach/g tube. . Anemia   a. 12/2015 ABL in setting of fall/hematomas/splenic laceration req splenectomy. Marland Kitchen Apical mural thrombus   a. 11/2015 Echo: EF 25-30%, moderate size apical thrombus-->coumadin;  b. 12/2015 f/u Echo: EF 25-30%, ant/septa HK, no obvious large thrombus but cannot exclude small mural thrombus. Marland Kitchen CAD (coronary artery disease)   a. 12/2014 s/p BMS to the RCA. Marland Kitchen Cerebral infarction (HCC)  . Chronic systolic CHF (congestive heart failure) (HCC)   a. 12/2015 Echo: EF 25-30%. . Dementia  . Depression  . Dysphagia  .  Falls   a. 11/2015 traumatic fall with resultant trauma req splenectomy and prolonged hospitalization complicated by resp failure. Marland Kitchen History of pneumonia  . Hyperlipidemia  . Hypertension  . Ischemic cardiomyopathy   a. 12/2015 Echo: EF 25-30%, anterior and septal HK. Marland Kitchen Left hemiplegia (HCC)  . Protein calorie malnutrition (HCC)  . Respiratory failure (HCC)  . Spleen injury  . Status post tracheostomy (HCC)   a. 01/2016 in setting of ongoing resp failure and aspiration. Past Surgical History: Past Surgical History: Procedure Laterality Date . CARDIAC CATHETERIZATION   . CORONARY ANGIOPLASTY WITH STENT PLACEMENT   . IR GENERIC HISTORICAL  07/22/2016  IR GASTROSTOMY TUBE REMOVAL 07/22/2016 [name], MD [facility]INTER[facility] . PR LAP, SPLENECTOMY  01/07/2016 . TRACHEOSTOMY TUBE PLACEMENT N/A 02/22/2016  Procedure: TRACHEOSTOMY;  Surgeon: [name], MD;  Location: [facility] OR;  Service: ENT;  Laterality: N/A; HPI: Pt. with PMH of HTN, chronic systolic CHF, chronic respiratory failure on 3L of oxygen, diabetes mellitus, recurrent fall, chronic anticoagulation, dyslipidemia; admitted on 11/26/2016, with complaint of A mechanical fall, was found to have closed left hip fracture.Patient developed aspiration pneumonia in the hospital. Currently surgery is on hold until pneumonia is better. Per MD pt was on dys 2/nectar thick liquids at SNF, but has been on thin liquids here. Pt had an MBS during admission to [facility] in 2017, but record not available. Pt also had a trach at that time. Reproted has a history of dysphagia due to prior CVAs.  No Data Recorded Assessment / Plan / Recommendation CHL IP CLINICAL IMPRESSIONS 11/30/2016 Therapy Diagnosis Severe pharyngeal phase dysphagia Clinical Impression Pt demonstrates a severe oropharygneal dysphagia with decreased duration and magnitude of hyolaryngeal excursion leading to severe oropharyngeal residuals that are aspirated with and without sensation in further swallow  attempts. Base of tongue propulsion of bolus is also poor and epiglottis does not deflect for airway protection. Thin liquids pose a severe risk of aspiration and nectar thick liquids reduce severity of aspiration events, but are still likely to be aspirated due to increased residuals with thick viscosity. A chin tuck significantly improves bolus transit if fully tucked against chest. Would suspect that carry over for this will be poor, but will trial dys 3 solids and nectar thick liquids with full supervision for a chin tuck to determine if  it is achievable.  Impact on safety and function Severe aspiration risk   CHL IP TREATMENT RECOMMENDATION 11/30/2016 Treatment Recommendations Therapy as outlined in treatment plan below   Prognosis 11/30/2016 Prognosis for Safe Diet Advancement Guarded Barriers to Reach Goals Severity of deficits;Time post onset Barriers/Prognosis Comment -- CHL IP DIET RECOMMENDATION 11/30/2016 SLP Diet Recommendations Dysphagia 3 (Mech soft) solids;Nectar thick liquid Liquid Administration via Cup;Straw Medication Administration Whole meds with puree Compensations Chin tuck;Slow rate;Small sips/bites;Minimize environmental distractions Postural Changes Remain semi-upright after after feeds/meals (Comment);Seated upright at 90 degrees   CHL IP OTHER RECOMMENDATIONS 11/30/2016 Recommended Consults -- Oral Care Recommendations Oral care BID Other Recommendations Order thickener from pharmacy   CHL IP FOLLOW UP RECOMMENDATIONS 11/30/2016 Follow up Recommendations Skilled Nursing facility   [facility] IP FREQUENCY AND DURATION 11/30/2016 Speech Therapy Frequency (ACUTE ONLY) min 2x/week Treatment Duration 2 weeks      CHL IP ORAL PHASE 11/30/2016 Oral Phase WFL Oral - Pudding Teaspoon -- Oral - Pudding Cup -- Oral - Honey Teaspoon -- Oral - Honey Cup -- Oral - Nectar Teaspoon -- Oral - Nectar Cup -- Oral - Nectar Straw -- Oral - Thin Teaspoon -- Oral - Thin Cup -- Oral - Thin Straw -- Oral - Puree -- Oral - Mech Soft  -- Oral - Regular -- Oral - Multi-Consistency -- Oral - Pill -- Oral Phase - Comment --  CHL IP PHARYNGEAL PHASE 11/30/2016 Pharyngeal Phase Impaired Pharyngeal- Pudding Teaspoon -- Pharyngeal -- Pharyngeal- Pudding Cup -- Pharyngeal -- Pharyngeal- Honey Teaspoon -- Pharyngeal -- Pharyngeal- Honey Cup -- Pharyngeal -- Pharyngeal- Nectar Teaspoon -- Pharyngeal -- Pharyngeal- Nectar Cup Reduced epiglottic inversion;Reduced anterior laryngeal mobility;Reduced airway/laryngeal closure;Reduced tongue base retraction;Delayed swallow initiation-pyriform sinuses;Pharyngeal residue - valleculae;Pharyngeal residue - pyriform;Compensatory strategies attempted (with notebox) Pharyngeal -- Pharyngeal- Nectar Straw Reduced epiglottic inversion;Reduced anterior laryngeal mobility;Reduced airway/laryngeal closure;Reduced tongue base retraction;Delayed swallow initiation-pyriform sinuses;Pharyngeal residue - valleculae;Pharyngeal residue - pyriform;Compensatory strategies attempted (with notebox);Other (Comment) Pharyngeal -- Pharyngeal- Thin Teaspoon -- Pharyngeal -- Pharyngeal- Thin Cup Reduced epiglottic inversion;Reduced anterior laryngeal mobility;Reduced airway/laryngeal closure;Reduced tongue base retraction;Delayed swallow initiation-pyriform sinuses;Pharyngeal residue - valleculae;Pharyngeal residue - pyriform;Penetration/Apiration after swallow;Significant aspiration (Amount) Pharyngeal Material enters airway, passes BELOW cords and not ejected out despite cough attempt by patient Pharyngeal- Thin Straw Reduced epiglottic inversion;Reduced anterior laryngeal mobility;Reduced airway/laryngeal closure;Reduced tongue base retraction;Delayed swallow initiation-pyriform sinuses;Pharyngeal residue - valleculae;Pharyngeal residue - pyriform;Compensatory strategies attempted (with notebox);Penetration/Aspiration during swallow Pharyngeal Material enters airway, passes BELOW cords without attempt by patient to eject out (silent  aspiration) Pharyngeal- Puree Reduced epiglottic inversion;Reduced anterior laryngeal mobility;Reduced airway/laryngeal closure;Reduced tongue base retraction;Pharyngeal residue - valleculae;Pharyngeal residue - pyriform;Compensatory strategies attempted (with notebox);Delayed swallow initiation-vallecula Pharyngeal -- Pharyngeal- Mechanical Soft Reduced epiglottic inversion;Reduced anterior laryngeal mobility;Reduced airway/laryngeal closure;Reduced tongue base retraction;Pharyngeal residue - valleculae;Pharyngeal residue - pyriform;Compensatory strategies attempted (with notebox);Delayed swallow initiation-vallecula Pharyngeal -- Pharyngeal- Regular -- Pharyngeal -- Pharyngeal- Multi-consistency -- Pharyngeal -- Pharyngeal- Pill Reduced epiglottic inversion;Reduced anterior laryngeal mobility;Reduced airway/laryngeal closure;Reduced tongue base retraction;Pharyngeal residue - valleculae;Pharyngeal residue - pyriform;Compensatory strategies attempted (with notebox);Delayed swallow initiation-vallecula Pharyngeal -- Pharyngeal Comment --  No flowsheet data found. CHL IP GO 11/29/2016 Functional Assessment Tool Used clinical judgement Functional Limitations Swallowing Swallow Current Status [phone]) CJ Swallow Goal Status (Y7829) CJ Swallow Discharge Status (F6213) (None) Motor Speech Current Status [phone]) (None) Motor Speech Goal Status (Q4696) (None) Motor Speech Goal Status (E9528) (None) Spoken Language Comprehension Current Status (U1324) (None) Spoken Language Comprehension Goal Status (M0102) (None) Spoken Language Comprehension Discharge Status [phone] (None) Spoken  Language Expression Current Status [phone]) (None) Spoken Language Expression Goal Status [phone]) (None) Spoken Language Expression Discharge Status [phone]) (None) Attention Current Status [phone]) (None) Attention Goal Status (Q2595) (None) Attention Discharge Status [phone]) (None) Memory Current Status (I4332) (None) Memory Goal Status (R5188)  (None) Memory Discharge Status (C1660) (None) Voice Current Status (Y3016) (None) Voice Goal Status (W1093) (None) Voice Discharge Status (A3557) (None) Other Speech-Language Pathology Functional Limitation [phone]) (None) Other Speech-Language Pathology Functional Limitation Goal Status (R4270) (None) Other Speech-Language Pathology Functional Limitation Discharge Status [phone]) (None) Harlon Ditty, MA CCC-SLP [phone] [place], Riley Nearing 11/30/2016, 2:50 PM              Dg Femur Min 2 Views Left  Result Date: 11/27/2016 CLINICAL DATA:  Larey Seat while attempting to get out of bed unassisted today. EXAM: LEFT FEMUR 2 VIEWS COMPARISON:  None. FINDINGS: There is a subcapital left hip fracture. Remainder of the femur is intact. No dislocation. No bone lesion or bony destruction. IMPRESSION: Subcapital left hip fracture Electronically Signed   By: Ellery Plunk M.D.   On: 11/27/2016 01:13     LOS: 10 days   Jeoffrey Massed, MD  [facility] Pager:[phone]  If 7PM-7AM, please contact night-coverage [email or web address] Password [email or web address] 12/07/2016, 1:29 PM

## 2016-12-07 NOTE — Progress Notes (Signed)
Daily Progress Note   Patient Name: Curtis Keller       Date: 12/07/2016 DOB: 10-28-1955  Age: 61 y.o. MRN#: [id number] Attending Physician: Jonetta Osgood, MD Primary Care Physician: Gildardo Cranker, DO Admit Date: 11/26/2016  Reason for Consultation/Follow-up: Establishing goals of care  Subjective: Curtis Keller continues to arouse and can be very insightful in conversation sometimes.   Length of Stay: 10  Current Medications: Scheduled Meds:  . ampicillin-sulbactam (UNASYN) IV  3 g Intravenous Q6H  . aspirin EC  81 mg Oral Daily  . atorvastatin  40 mg Oral QHS  . busPIRone  10 mg Oral BID  . divalproex  125 mg Oral TID  . famotidine  20 mg Oral BID  . feeding supplement (ENSURE ENLIVE)  237 mL Oral BID BM  . FLUoxetine  60 mg Oral Daily  . heparin subcutaneous  5,000 Units Subcutaneous Q8H  . hydrALAZINE  12.5 mg Oral q12n4p  . insulin aspart  0-9 Units Subcutaneous TID WC  . ipratropium-albuterol  3 mL Nebulization TID  . mouth rinse  15 mL Mouth Rinse BID  . metoprolol succinate  25 mg Oral Daily  . nicotine  21 mg Transdermal Daily  . polyethylene glycol  17 g Oral Daily  . potassium chloride  20 mEq Oral Daily  . pregabalin  75 mg Oral Daily  . senna-docusate  1 tablet Oral BID    Continuous Infusions: . sodium chloride 10 mL/hr at 12/05/16 1945    PRN Meds: acetaminophen, fentaNYL (SUBLIMAZE) injection, haloperidol lactate, methocarbamol, oxyCODONE-acetaminophen, RESOURCE THICKENUP CLEAR  Physical Exam  Constitutional: He is oriented to person, place, and time. He appears well-developed. He appears lethargic.  HENT:  Head: Normocephalic and atraumatic.  Cardiovascular: Normal rate.   Occasional PVC  Pulmonary/Chest: Effort normal. No accessory muscle usage.  No tachypnea. No respiratory distress.  Abdominal: Normal appearance.  Neurological: He is oriented to person, place, and time. He appears lethargic.  Nursing note and vitals reviewed.           Vital Signs: BP 121/75 (BP Location: Left Arm)   Pulse (!) 46   Temp 99.7 F (37.6 C) (Oral)   Resp (!) 24   Ht 6' 2" (1.88 m)   Wt 106.6 kg (235 lb 1.6 oz)   SpO2 91%  BMI 30.19 kg/m  SpO2: SpO2: 91 % O2 Device: O2 Device: High Flow Nasal Cannula O2 Flow Rate: O2 Flow Rate (L/min): 4.5 L/min  Intake/output summary:   Intake/Output Summary (Last 24 hours) at 12/07/16 1550 Last data filed at 12/07/16 1405  Gross per 24 hour  Intake          1260.83 ml  Output             1700 ml  Net          -439.17 ml   LBM: Last BM Date: 12/06/16 Baseline Weight: Weight: 102.1 kg (225 lb) Most recent weight: Weight: 106.6 kg (235 lb 1.6 oz)       Palliative Assessment/Data:    Flowsheet Rows   Flowsheet Row Most Recent Value  Intake Tab  Referral Department  Hospitalist  Unit at Time of Referral  ICU  Palliative Care Primary Diagnosis  Cardiac  Date Notified  11/30/16  Palliative Care Type  New Palliative care  Reason for referral  Clarify Goals of Care  Date of Admission  11/26/16  Date first seen by Palliative Care  11/30/16  # of days Palliative referral response time  0 Day(s)  # of days IP prior to Palliative referral  4  Clinical Assessment  Psychosocial & Spiritual Assessment  Palliative Care Outcomes      Patient Active Problem List   Diagnosis Date Noted  . Ischemic cardiomyopathy   . Goals of care, counseling/discussion   . Palliative care encounter   . Acute systolic congestive heart failure ([place])   . Acute and chronic respiratory failure with hypoxia ([place]) 11/28/2016  . Hip fracture ([place]) 11/27/2016  . Closed nondisplaced intertrochanteric fracture of left femur ([place]) 11/27/2016  . Chronic respiratory failure ([place]) 11/27/2016  . Fall at nursing home 07/17/2016    . Diabetes mellitus, type II, insulin dependent ([place]) 06/12/2016  . GERD (gastroesophageal reflux disease) 05/07/2016  . COPD (chronic obstructive pulmonary disease) ([place]) 04/18/2016  . Chronic anticoagulation 03/22/2016  . S/P splenectomy 03/13/2016  . PEG (percutaneous endoscopic gastrostomy) status ([place]) 03/13/2016  . MRSA (methicillin resistant Staphylococcus aureus) septicemia ([place]) 03/13/2016  . Aspiration pneumonia ([place]) 03/13/2016  . Dysphagia 03/13/2016  . Acute blood loss anemia 03/13/2016  . Hyperlipidemia 03/13/2016  . Heart failure, chronic systolic ([place])   . Hypertension   . Depression   . Status post tracheostomy ([place])   . Cardiomyopathy ([place])   . Apical mural thrombus   . Protein calorie malnutrition ([place])   . Intraparenchymal hematoma of brain due to trauma [facility])     Palliative Care Assessment & Plan   HPI: 61 y.o. male  with past medical history ofHTN, systolic CHF EF 48-18%,HUD, DM type II, AAA, COPD, chronic respiratory failure on 2 L of NCoxygen at night, CVA with residual left-sided hemiparesis/dysphagia, apical thrombus on Coumadin, splenectomy s/p fall with complications resulting in tracheostomy x 6 weeks admitted on 11/26/2016 with left hip fracture s/p fall at SNF. Awaiting hip repair d/t concern for respiratory status aspiration pneumonia and heart failure exacerbation. High risk for post-op delirium as well as he is already exhibiting signs of delirium. Surgery again postponed 2/12 d/t worsening renal function. WBC also more elevated and making little to no progress or improvement. 12/06/16 worsening with further cardiac, respiratory and renal failure.    Assessment: I met today at Curtis Keller bedside with sister, Curtis Keller. Curtis Keller continues to struggle with her brother's poor prognosis. When I enter room Curtis Keller is discussing  with Dr. Sloan Leiter DNR status and the fact that he is dying. Long discussion of resuscitation and life support and if this is right for  her brother. After Dr. Sloan Leiter left the room Curtis Keller looked at [facility] and says "just let it happen" and ask him "let what happen?" to which he told Curtis Keller "just let me die."   I exited room to get some milk for Curtis Keller and joined Dr. Sloan Leiter on phone with daughter, Curtis Keller, who lives in [place] and says she plans to come on Friday. She initially is requesting short term intubation if needed. I shared with her what her father had just said and she became very tearful and explains that she just lost her mother as well.   After much conversation and emotional support Curtis Keller agrees (and [place] called RN back to confirm) desire for DNR but for full aggressive care otherwise. Will continue to follow and support.   Recommendations/Plan:  Pain: Continue percocet and fentanyl prn.   Delirium: Continue haldol prn.   Goals of Care and Additional Recommendations:  Limitations on Scope of Treatment: Full Scope Treatment  Code Status:  DNR  Prognosis:   Unable to determine  Discharge Planning:  To Be Determined   Thank you for allowing the Palliative Medicine Team to assist in the care of this patient.   Total Time 44mn Prolonged Time Billed  no      Greater than 50%  of this time was spent counseling and coordinating care related to the above assessment and plan.  AVinie Sill NP Palliative Medicine Team Pager # [phone](M-F 8a-5p) Team Phone # [phone](Nights/Weekends)

## 2016-12-08 ENCOUNTER — Inpatient Hospital Stay (HOSPITAL_COMMUNITY): Payer: Medicare Other

## 2016-12-08 LAB — COMPREHENSIVE METABOLIC PANEL
ALT: 17 U/L (ref 17–63)
AST: 21 U/L (ref 15–41)
Albumin: 1.9 g/dL — ABNORMAL LOW (ref 3.5–5.0)
Alkaline Phosphatase: 84 U/L (ref 38–126)
Anion gap: 10 (ref 5–15)
BUN: 85 mg/dL — AB (ref 6–20)
CHLORIDE: 113 mmol/L — AB (ref 101–111)
CO2: 32 mmol/L (ref 22–32)
CREATININE: 2.71 mg/dL — AB (ref 0.61–1.24)
Calcium: 9 mg/dL (ref 8.9–10.3)
GFR calc Af Amer: 28 mL/min — ABNORMAL LOW (ref >60)
GFR calc non Af Amer: 24 mL/min — ABNORMAL LOW (ref >60)
Glucose, Bld: 172 mg/dL — ABNORMAL HIGH (ref 65–99)
Potassium: 4 mmol/L (ref 3.5–5.1)
SODIUM: 155 mmol/L — AB (ref 135–145)
Total Bilirubin: 0.6 mg/dL (ref 0.3–1.2)
Total Protein: 6.3 g/dL — ABNORMAL LOW (ref 6.5–8.1)

## 2016-12-08 LAB — CBC
HCT: 44.6 % (ref 39.0–52.0)
Hemoglobin: 14.4 g/dL (ref 13.0–17.0)
MCH: 33.2 pg (ref 26.0–34.0)
MCHC: 32.3 g/dL (ref 30.0–36.0)
MCV: 102.8 fL — ABNORMAL HIGH (ref 78.0–100.0)
PLATELETS: 197 10*3/uL (ref 150–400)
RBC: 4.34 MIL/uL (ref 4.22–5.81)
RDW: 16.3 % — ABNORMAL HIGH (ref 11.5–15.5)
WBC: 29.4 10*3/uL — AB (ref 4.0–10.5)

## 2016-12-08 LAB — GLUCOSE, CAPILLARY
GLUCOSE-CAPILLARY: 221 mg/dL — AB (ref 65–99)
Glucose-Capillary: 150 mg/dL — ABNORMAL HIGH (ref 65–99)
Glucose-Capillary: 165 mg/dL — ABNORMAL HIGH (ref 65–99)
Glucose-Capillary: 279 mg/dL — ABNORMAL HIGH (ref 65–99)

## 2016-12-08 MED ORDER — GLYCOPYRROLATE 0.2 MG/ML IJ SOLN
0.2000 mg | INTRAMUSCULAR | Status: DC | PRN
  Administered 2016-12-10: 0.2 mg via INTRAVENOUS
  Filled 2016-12-08: qty 1

## 2016-12-08 NOTE — Progress Notes (Signed)
amion                        PROGRESS NOTE        PATIENT DETAILS Name: Curtis Keller Age: 61 y.o. Sex: male Date of Birth: [birth year] Admit Date: 11/26/2016 Admitting Physician Clydie Braun, MD ZOX:[id number], Maxine Glenn, DO  Brief Narrative: Patient is a 61 y.o. male with history chronic systolic heart failure (EF 25-30%), history of LV thrombus, prior CVA with chronic left-sided hemiplegia-multiple falls over the past few years causing splenic rupture-presented to the hospital on 2/3 following a mechanical fall, found to have a left hip fracture. Unfortunately hospital course has been complicated by development of acute hypoxic respiratory failure, felt due to aspiration PNA vs acute systolic CHF, worsening renal function and hyponatremia. After extensive discussion with patient, family-he is now a DO NOT RESUSCITATE.  Subjective: Essentially unchanged-still very weak and frail. Still -"gurgling" his secretions this am.   Assessment/Plan: Closed Left hip fracture: Secondary to a mechanical fall-unfortunately before hip repair could be attempted-patient developed hypoxic resp failure due to aspiration pneumonia and acute systolic heart failure. Although he initially improved with antibiotics and Lasix, his oxygen requirements have again gone up-he is back to 5-6 L of oxygen today. He sounds more congested today, and is pooling his secretions. After extensive discussion with multiple family members over the past few days-patient is now a DO NOT RESUSCITATE   Acute hypoxemic respiratory failure secondary to aspiration pneumonia and acute on chronic systolic heart failure: Patient had a aspiration event on 2/5 requiring transfer to stepdown and 100% nonrebreather mask. He was subsequently treated with IV antibiotics and IV Lasix. He initially improved and his oxygen was titrated down to 3 L via nasal cannula. He unfortunately seems to have worsened over the past few days, and is overtly aspirating.  He remains on empiric Unasyn. Very poor overall prognoses-he continues to overtly aspirating secretions-see below regarding regarding poor prognosis, and end-of-life discussion.  Acute kidney injury: Suspect hemodynamic mediated acute kidney injury due to ongoing aspiration pneumonitis, congestive heart failure, diuretic use. Creatinine still elevated despite gentle hydration. Continue to follow electrolytes.   Hypernatremia: probably due to decrease in oral intake and diuretic use-difficult situation-he was briefly hydrated with D5W-unfortunately his hyponatremia continues to worsen. He seems to have more problems today hence all IV fluids have been discontinued. Marland Kitchen  Dysphagia: Speech therapy following, underwent modified barium swallow-this is a chronic issue-patient has a prior history of CVA-he previously had a tube in place. Recommendations are to try a dysphagia 3 solids with nectar thick liquids. Spoke with patient's sister over the phone on 2/10, she is aware of the aspiration risk and wishes to continue with Dys 3 diet with nectar thick liquid. Unfortunately, per RN-patient's sister has been occasionally giving patient thin liquids. See below regarding poor prognoses and end-of-life issue discussion  History of left apical thrombus: Previously on Coumadin-currently on hold for surgery. Given high risk for venous thromboembolism post surgery, suspect will require short-term anticoagulation. Long discussion with patient's sister at bedside on 2/7, she would prefer short-term anticoagulation post surgery and is willing to accept all risks.  Acute metabolic encephalopathy/dementia: Mental status continues to wax and wane-at times he is completely awake and alert and at times he is confused.   Leukocytosis: Initially thought to be secondary to steroids, but white count continues to worsen-likely secondary to aspiration pneumonia. Remains on Unasyn  Hx of COPD: not in exacerbation-see above.  History of CVA with chronic left sided weakness: Previously on anticoagulation-left-sided deficits are unchanged. Continue statin. See above regarding anticoagulation  GERD: Continue Pepcid  Type 2 diabetes: CBGs stable-continue SSI  History of anxiety/depression/mild dementia: Continue Depakote, fluoxetine and BuSpar.  Frequent falls: Has had frequent falls in the past few years-one episode of fall resulted in a splenic laceration requiring splenectomy. Unfortunately, patient also has a history of LV thrombus and a CVA with left hemiplegia. Although he is a poor long term candidate, he is at significant risk of having venous thromboembolism post-hip surgery. Long discussion with the patient's sister on 2/7(who is a nurse at bedside)-she is agreeable to restart anticoagulation post operatively for the short-term.  Palliative care: Unfortunate 61 year old male with chronic systolic heart failure, prior history of LV thrombus, CVA with left hemiplegia-recurrent falls-had splenectomy a few years back due to splenic rupture-presented with left hip fracture. Unfortunately before hip repair could be performed, patient developed acute hypoxemic respiratory failure due to presumed aspiration pneumonia and acute on chronic systolic heart failure. Further hospital course was complicated by worsening renal function and hyponatremia. He subsequently also has started to have increasing oxygen requirements, and has started to overtly aspirate and is pooling his secretions. Palliative care has been following the patient. This M.D. and the palliative care in the has had multiple discussions with the patient, his sister and his daughter over the phone (in [place]). Family is aware of very poor overall prognoses, and that patient may not survive this hospitalization. After multiple discussions, on 2/14 family agreed for a DO NOT RESUSCITATE order. He unfortunately continues to deteriorate, family aware that patient may  require transition to full comfort measures/residential hospice.  DVT Prophylaxis: Prophylactic Heparin   Code Status: DNR  Family Communication: None at bedside this am-palliative care has reached out to family  Disposition Plan: Remain inpatient-remain in SDU-SNF vs residential hospice on discharge  Antimicrobial agents: Anti-infectives    Start     Dose/Rate Route Frequency Ordered Stop   12/06/16 1230  Ampicillin-Sulbactam (UNASYN) 3 g in sodium chloride 0.9 % 100 mL IVPB     3 g 200 mL/hr over 30 Minutes Intravenous Every 6 hours 12/06/16 1139     11/28/16 1530  piperacillin-tazobactam (ZOSYN) IVPB 3.375 g     3.375 g 12.5 mL/hr over 240 Minutes Intravenous Every 8 hours 11/28/16 1518 12/04/16 2359   11/28/16 1530  vancomycin (VANCOCIN) IVPB 750 mg/150 ml premix  Status:  Discontinued     750 mg 150 mL/hr over 60 Minutes Intravenous Every 12 hours 11/28/16 1518 12/01/16 1031   11/28/16 1520  ceFAZolin (ANCEF) IVPB 2g/100 mL premix  Status:  Discontinued     2 g 200 mL/hr over 30 Minutes Intravenous To ShortStay [facility] 11/28/16 1247 11/28/16 2334   11/28/16 1000  piperacillin-tazobactam (ZOSYN) IVPB 3.375 g  Status:  Discontinued     3.375 g 12.5 mL/hr over 240 Minutes Intravenous Every 8 hours 11/28/16 0832 11/28/16 1518   11/28/16 1000  vancomycin (VANCOCIN) IVPB 750 mg/150 ml premix  Status:  Discontinued     750 mg 150 mL/hr over 60 Minutes Intravenous Every 12 hours 11/28/16 0832 11/28/16 1518      Procedures: Echo 2/5 - Very limited image quality, however LVEF is severely impaired,   estimated at 25-30% with akinesis of all of the apical segments   and mid anteroseptal, anterior and anterolateral segments.   There is no thrombus on the contrast images.   A cardiac MRI  is recommended for further LVEF evaluation.   CONSULTS:  cardiology and orthopedic surgery  Time spent: 35- minutes-Greater than 50% of this time was spent in counseling, explanation of  diagnosis, planning of further management, and coordination of care.  MEDICATIONS: Scheduled Meds: . ampicillin-sulbactam (UNASYN) IV  3 g Intravenous Q6H  . aspirin EC  81 mg Oral Daily  . atorvastatin  40 mg Oral QHS  . busPIRone  10 mg Oral BID  . divalproex  125 mg Oral TID  . famotidine  20 mg Oral BID  . feeding supplement (ENSURE ENLIVE)  237 mL Oral BID BM  . FLUoxetine  60 mg Oral Daily  . heparin subcutaneous  5,000 Units Subcutaneous Q8H  . hydrALAZINE  12.5 mg Oral q12n4p  . insulin aspart  0-9 Units Subcutaneous TID WC  . ipratropium-albuterol  3 mL Nebulization TID  . mouth rinse  15 mL Mouth Rinse BID  . metoprolol succinate  25 mg Oral Daily  . polyethylene glycol  17 g Oral Daily  . potassium chloride  20 mEq Oral Daily  . pregabalin  75 mg Oral Daily  . senna-docusate  1 tablet Oral BID   Continuous Infusions: . sodium chloride Stopped (12/08/16 1113)   PRN Meds:.acetaminophen, fentaNYL (SUBLIMAZE) injection, haloperidol lactate, methocarbamol, oxyCODONE-acetaminophen, RESOURCE THICKENUP CLEAR   PHYSICAL EXAM: Vital signs: Vitals:   12/08/16 0300 12/08/16 0500 12/08/16 0735 12/08/16 0753  BP: (!) 140/98  129/85   Pulse: 87  81   Resp: 18  19   Temp: 99.2 F (37.3 C)  98.3 F (36.8 C)   TempSrc: Oral  Axillary   SpO2: 94%  92% 92%  Weight:  107.5 kg (237 lb)    Height:       Filed Weights   12/06/16 0355 12/07/16 0431 12/08/16 0500  Weight: 106.8 kg (235 lb 6.4 oz) 106.6 kg (235 lb 1.6 oz) 107.5 kg (237 lb)   Body mass index is 30.43 kg/m.   General appearance :Awake, alert, Looks very frail and slightly lethargic. "Gurgling" Eyes:, pupils equally reactive to light and accomodation HEENT: Atraumatic and Normocephalic Neck: supple, no JVD. No cervical lymphadenopathy. Resp:Good air entry bilaterally, rales all over upper airway sounds.Transmitted upper airway sounds CVS: S1 S2 regular GI: Bowel sounds present, Non tender and not distended with  no gaurding, rigidity or rebound.No organomegaly Extremities: B/L Lower Ext shows no edema, both legs are warm to touch Neurology: Left hemiplegia Musculoskeletal:No digital cyanosis Skin:No Rash, warm and dry Wounds:N/A  I have personally reviewed following labs and imaging studies  LABORATORY DATA: CBC:  Recent Labs Lab 12/04/16 0236 12/05/16 0209 12/06/16 0151 12/07/16 0312 12/08/16 0738  WBC 21.6* 22.3* 18.1* 27.6* 29.4*  HGB 16.7 16.4 15.8 16.1 14.4  HCT 50.0 49.4 48.3 49.3 44.6  MCV 99.6 100.6* 101.7* 101.6* 102.8*  PLT 235 242 245 227 197    Basic Metabolic Panel:  Recent Labs Lab 12/04/16 0236 12/05/16 0209 12/06/16 0151 12/07/16 0312 12/08/16 0738  NA 150* 151* 153* 153* 155*  K 4.3 4.3 4.5 4.6 4.0  CL 106 109 109 110 113*  CO2 31 31 31 31  32  GLUCOSE 151* 200* 146* 207* 172*  BUN 86* 87* 88* 86* 85*  CREATININE 2.43* 2.32* 2.53* 2.69* 2.71*  CALCIUM 9.7 9.5 9.2 9.1 9.0    GFR: Estimated Creatinine Clearance: 37.4 mL/min (by C-G formula based on SCr of 2.71 mg/dL (H)).  Liver Function Tests:  Recent Labs Lab 12/08/16 0738  AST  21  ALT 17  ALKPHOS 84  BILITOT 0.6  PROT 6.3*  ALBUMIN 1.9*   No results for input(s): LIPASE, AMYLASE in the last 168 hours. No results for input(s): AMMONIA in the last 168 hours.  Coagulation Profile:  Recent Labs Lab 12/06/16 0151  INR 1.18    Cardiac Enzymes: No results for input(s): CKTOTAL, CKMB, CKMBINDEX, TROPONINI in the last 168 hours.  BNP (last 3 results) No results for input(s): PROBNP in the last 8760 hours.  HbA1C: No results for input(s): HGBA1C in the last 72 hours.  CBG:  Recent Labs Lab 12/07/16 0816 12/07/16 1209 12/07/16 1619 12/07/16 2155 12/08/16 0736  GLUCAP 206* 195* 222* 307* 150*    Lipid Profile: No results for input(s): CHOL, HDL, LDLCALC, TRIG, CHOLHDL, LDLDIRECT in the last 72 hours.  Thyroid Function Tests: No results for input(s): TSH, T4TOTAL, FREET4,  T3FREE, THYROIDAB in the last 72 hours.  Anemia Panel: No results for input(s): VITAMINB12, FOLATE, FERRITIN, TIBC, IRON, RETICCTPCT in the last 72 hours.  Urine analysis:    Component Value Date/Time   COLORURINE YELLOW 11/27/2016 0355   APPEARANCEUR CLEAR 11/27/2016 0355   LABSPEC 1.015 11/27/2016 0355   PHURINE 6.0 11/27/2016 0355   GLUCOSEU NEGATIVE 11/27/2016 0355   HGBUR LARGE (A) 11/27/2016 0355   BILIRUBINUR NEGATIVE 11/27/2016 0355   KETONESUR NEGATIVE 11/27/2016 0355   PROTEINUR 100 (A) 11/27/2016 0355   NITRITE NEGATIVE 11/27/2016 0355   LEUKOCYTESUR NEGATIVE 11/27/2016 0355    Sepsis Labs: Lactic Acid, Venous    Component Value Date/Time   LATICACIDVEN 1.9 11/28/2016 2023    MICROBIOLOGY: No results found for this or any previous visit (from the past 240 hour(s)).  RADIOLOGY STUDIES/RESULTS: Dg Chest 1 View  Result Date: 11/27/2016 CLINICAL DATA:  Larey Seat today.  Left hip fracture. EXAM: CHEST 1 VIEW COMPARISON:  02/24/2016 FINDINGS: Diffuse interstitial coarsening is probably chronic. No consolidation. No effusion. No pneumothorax. New unchanged hilar, mediastinal and cardiac contours. IMPRESSION: Chronic appearing interstitial coarsening. No consolidation or effusion. Electronically Signed   By: Ellery Plunk M.D.   On: 11/27/2016 01:14   Dg Pelvis 1-2 Views  Result Date: 11/27/2016 CLINICAL DATA:  Larey Seat today while trying to get out of bed unassisted. EXAM: PELVIS - 1-2 VIEW COMPARISON:  None. FINDINGS: There is an acute subcapital left hip fracture. No dislocation. No bone lesion or bony destruction. Pelvis appears intact. IMPRESSION: Subcapital left hip fracture Electronically Signed   By: Ellery Plunk M.D.   On: 11/27/2016 01:13   Ct Head Wo Contrast  Result Date: 11/27/2016 CLINICAL DATA:  Larey Seat getting out of bed today, acute LEFT hip fracture. History of hypertension, stroke. EXAM: CT HEAD WITHOUT CONTRAST TECHNIQUE: Contiguous axial images were  obtained from the base of the skull through the vertex without intravenous contrast. COMPARISON:  CT HEAD January 26, 2016 FINDINGS: BRAIN: No intraparenchymal hemorrhage, mass effect, midline shift or acute large vascular territory infarcts. Old RIGHT greater than LEFT cerebellar infarcts, severe brachium contrast atrophy. Old RIGHT greater than LEFT pontine infarcts. Old LEFT thalamus and bilateral basal ganglia lacunar infarcts. Old small RIGHT occipital lobe infarct. LEFT inferior temporal lobe encephalomalacia is likely posttraumatic. Mild to moderate white matter changes compatible with chronic small vessel ischemic disease. No abnormal extra-axial fluid collection. Moderate ventriculomegaly on the basis of global parenchymal brain volume loss. VASCULAR: Mild to moderate calcific atherosclerosis of the carotid siphons. SKULL: No skull fracture. No significant scalp soft tissue swelling. SINUSES/ORBITS: Bilateral maxillary mucosal retention cysts.  Mastoid air cells are well aerated. The included ocular globes and orbital contents are non-suspicious. OTHER: Patient is edentulous. IMPRESSION: No acute intracranial process. Stable examination including numerous old supra- and infratentorial infarcts predominately within posterior circulation, moderate global brain atrophy. Electronically Signed   By: Awilda Metro M.D.   On: 11/27/2016 03:01   Dg Chest Port 1 View  Result Date: 12/08/2016 CLINICAL DATA:  Chronic CHF, aspiration pneumonia, underlying COPD. EXAM: PORTABLE CHEST 1 VIEW COMPARISON:  Portable chest x-ray of December 07, 2016 FINDINGS: The lungs are slightly less well inflated today. The pulmonary interstitial markings remain increased with areas patchy confluence noted. There is no large pleural effusion. The cardiac silhouette remains enlarged. The pulmonary vascularity remains prominent centrally. There is calcification in the wall of the aortic arch. There is old deformity of the midshaft of  the right clavicle. IMPRESSION: Decreased aeration of both lungs today which accentuates the interstitial markings. Persistent bilateral interstitial and alveolar opacities likely reflect pneumonia though mild CHF is suspected as well. Electronically Signed   By: David  [place] M.D.   On: 12/08/2016 07:19   Dg Chest Port 1 View  Result Date: 12/07/2016 CLINICAL DATA:  61 year old male with shortness of breath. Recent fall. Initial encounter. EXAM: PORTABLE CHEST 1 VIEW COMPARISON:  12/06/2016 and earlier. FINDINGS: Portable AP semi upright view at 0515 hours. Stable cardiomegaly and mediastinal contours. Widespread increased pulmonary interstitial markings are chronic to a degree. No superimposed pneumothorax or pulmonary edema. No pleural effusion is evident. Increased patchy retrocardiac opacity since 11/27/2016. Chronic right clavicle deformity. IMPRESSION: Cardiomegaly and chronic lung disease. Left lung base opacity has increased since 11/27/2016, suspicious for left lung base pneumonia. No pleural effusion identified. Electronically Signed   By: Odessa Fleming M.D.   On: 12/07/2016 07:43   Dg Chest Port 1 View  Result Date: 12/06/2016 CLINICAL DATA:  Shortness of breath . EXAM: PORTABLE CHEST 1 VIEW COMPARISON:  12/05/2016. FINDINGS: Cardiomegaly with progressive bilateral pulmonary infiltrates consistent with congestive heart failure and pulmonary edema. Bilateral pneumonia cannot be excluded . Small right pleural effusion cannot be excluded. No pneumothorax . Healed right clavicular fracture. IMPRESSION: 1. Cardiomegaly with progressive bilateral pulmonary infiltrates/ pulmonary edema. Findings consistent with congestive heart failure. Bilateral pneumonia cannot be excluded. Small right pleural effusion cannot be excluded. 2.  Low lung volumes. Electronically Signed   By: Maisie Fus  Register   On: 12/06/2016 06:50   Dg Chest Port 1 View  Result Date: 12/05/2016 CLINICAL DATA:  Shortness of breath . EXAM:  PORTABLE CHEST 1 VIEW COMPARISON:  12/04/2016 . FINDINGS: Cardiomegaly with bilateral from interstitial prominence. No change from prior exam. No pleural effusion or pneumothorax P IMPRESSION: Cardiomegaly with bilateral mild interstitial prominence consistent interstitial edema again noted. No interim change from prior exam. Electronically Signed   By: Maisie Fus  Register   On: 12/05/2016 07:23   Dg Chest Port 1 View  Result Date: 12/04/2016 CLINICAL DATA:  Shortness of Breath EXAM: PORTABLE CHEST 1 VIEW COMPARISON:  12/03/2016 FINDINGS: Cardiomegaly. Mild interstitial prominence and vascular congestion. Question mild interstitial edema. No effusions or acute bony abnormality. IMPRESSION: Vascular congestion with interstitial prominence, question mild interstitial edema. Electronically Signed   By: Charlett Nose M.D.   On: 12/04/2016 07:11   Dg Chest Port 1 View  Result Date: 12/03/2016 CLINICAL DATA:  Shortness of Breath EXAM: PORTABLE CHEST 1 VIEW COMPARISON:  12/02/2016 FINDINGS: Cardiac shadow is enlarged. Lungs are well aerated bilaterally. Vascular congestion is noted without significant interstitial  edema. No sizable effusion is noted. No bony abnormality is seen. IMPRESSION: Stable vascular congestion. Electronically Signed   By: Alcide Clever M.D.   On: 12/03/2016 15:20   Dg Chest Port 1 View  Result Date: 12/02/2016 CLINICAL DATA:  Dyspnea EXAM: PORTABLE CHEST 1 VIEW COMPARISON:  Chest radiograph from one day prior. FINDINGS: Slightly right rotated chest radiograph. Stable cardiomediastinal silhouette with mild cardiomegaly. No pneumothorax. No pleural effusion. Mild pulmonary edema, decreased. IMPRESSION: Mild congestive heart failure, improved. Electronically Signed   By: Delbert Phenix M.D.   On: 12/02/2016 08:06   Dg Chest Port 1 View  Addendum Date: 12/01/2016   ADDENDUM REPORT: 12/01/2016 08:08 ADDENDUM: There is an old healed right clavicle fracture, stable. Electronically Signed   By:  Bretta Bang III M.D.   On: 12/01/2016 08:08   Result Date: 12/01/2016 CLINICAL DATA:  Shortness of Breath EXAM: PORTABLE CHEST 1 VIEW COMPARISON:  November 30, 2016 FINDINGS: Extensive airspace consolidation bilaterally, most severe in the left upper to mid lung zones, up remains without significant change. There is cardiomegaly with pulmonary venous hypertension. No adenopathy evident. No bone lesions. IMPRESSION: Widespread airspace consolidation bilaterally, somewhat more on the left than on the right. Evidence a degree of underlying pulmonary vascular congestion. Question multifocal pneumonia versus pulmonary edema; both entities may well exist concurrently. The overall appearance of the lungs and cardiomediastinal silhouette is stable compared to 1 day prior. Electronically Signed: By: Bretta Bang III M.D. On: 12/01/2016 08:02   Dg Chest Port 1 View  Result Date: 11/28/2016 CLINICAL DATA:  Shortness of breath. EXAM: PORTABLE CHEST 1 VIEW COMPARISON:  11/27/2016 and 02/24/2016 FINDINGS: The patient has progressive bilateral pulmonary infiltrates which may represent pulmonary edema or progressive pneumonia. Pulmonary vascularity appears normal in the overall heart size is normal. IMPRESSION: Probable progressive bilateral pneumonia. Electronically Signed   By: Francene Boyers M.D.   On: 11/28/2016 15:31   Dg Chest Port 1 View  Result Date: 11/27/2016 CLINICAL DATA:  sob EXAM: PORTABLE CHEST 1 VIEW COMPARISON:  11/27/2016 FINDINGS: The heart is enlarged. There are airspace filling opacities bilaterally, particularly well seen at the left lung base. Patient is rotated. IMPRESSION: Bilateral airspace filling opacities consistent with pulmonary edema and/or infectious process. Electronically Signed   By: Norva Pavlov M.D.   On: 11/27/2016 22:17   Dg Chest Port 1v Same Day  Result Date: 11/30/2016 CLINICAL DATA:  Shortness of breath, cough today, pneumonia, history hypertension, prior  respiratory failure, coronary disease, chronic systolic CHF, ischemic cardiomyopathy EXAM: PORTABLE CHEST 1 VIEW COMPARISON:  Portable exam 0922 hours compared to 11/28/2016 FINDINGS: Rotated to the LEFT with kyphotic positioning. Enlargement of cardiac silhouette. Stable mediastinal contours for degree of rotation. Extensive BILATERAL pulmonary infiltrates favor pneumonia over edema. No pleural effusion or pneumothorax. Bones unremarkable. IMPRESSION: Persistent BILATERAL pulmonary infiltrates question pneumonia. Enlargement of cardiac silhouette. Electronically Signed   By: Ulyses Southward M.D.   On: 11/30/2016 09:31   Dg Swallowing Func-speech Pathology  Result Date: 11/30/2016 Objective Swallowing Evaluation: Type of Study: MBS-Modified Barium Swallow Study Patient Details Name: JANSON LAMAR MRN: [id number] Date of Birth: [birth year] Today's Date: 11/30/2016 Time: SLP Start Time (ACUTE ONLY): 1310-SLP Stop Time (ACUTE ONLY): 1325 SLP Time Calculation (min) (ACUTE ONLY): 15 min Past Medical History: Past Medical History: Diagnosis Date . Acute respiratory failure (HCC)   a. Spring 2017 following fall/splenectomy -->admitted to [facility]-->trach/g tube. . Anemia   a. 12/2015 ABL in setting of fall/hematomas/splenic laceration  req splenectomy. Marland Kitchen Apical mural thrombus   a. 11/2015 Echo: EF 25-30%, moderate size apical thrombus-->coumadin;  b. 12/2015 f/u Echo: EF 25-30%, ant/septa HK, no obvious large thrombus but cannot exclude small mural thrombus. Marland Kitchen CAD (coronary artery disease)   a. 12/2014 s/p BMS to the RCA. Marland Kitchen Cerebral infarction (HCC)  . Chronic systolic CHF (congestive heart failure) (HCC)   a. 12/2015 Echo: EF 25-30%. . Dementia  . Depression  . Dysphagia  . Falls   a. 11/2015 traumatic fall with resultant trauma req splenectomy and prolonged hospitalization complicated by resp failure. Marland Kitchen History of pneumonia  . Hyperlipidemia  . Hypertension  . Ischemic cardiomyopathy   a. 12/2015 Echo: EF 25-30%,  anterior and septal HK. Marland Kitchen Left hemiplegia (HCC)  . Protein calorie malnutrition (HCC)  . Respiratory failure (HCC)  . Spleen injury  . Status post tracheostomy (HCC)   a. 01/2016 in setting of ongoing resp failure and aspiration. Past Surgical History: Past Surgical History: Procedure Laterality Date . CARDIAC CATHETERIZATION   . CORONARY ANGIOPLASTY WITH STENT PLACEMENT   . IR GENERIC HISTORICAL  07/22/2016  IR GASTROSTOMY TUBE REMOVAL 07/22/2016 [name], MD [facility] . PR LAP, SPLENECTOMY  01/07/2016 . TRACHEOSTOMY TUBE PLACEMENT N/A 02/22/2016  Procedure: TRACHEOSTOMY;  Surgeon: [name], MD;  Location: [facility] OR;  Service: ENT;  Laterality: N/A; HPI: Pt. with PMH of HTN, chronic systolic CHF, chronic respiratory failure on 3L of oxygen, diabetes mellitus, recurrent fall, chronic anticoagulation, dyslipidemia; admitted on 11/26/2016, with complaint of A mechanical fall, was found to have closed left hip fracture.Patient developed aspiration pneumonia in the hospital. Currently surgery is on hold until pneumonia is better. Per MD pt was on dys 2/nectar thick liquids at SNF, but has been on thin liquids here. Pt had an MBS during admission to [facility] in 2017, but record not available. Pt also had a trach at that time. Reproted has a history of dysphagia due to prior CVAs.  No Data Recorded Assessment / Plan / Recommendation CHL IP CLINICAL IMPRESSIONS 11/30/2016 Therapy Diagnosis Severe pharyngeal phase dysphagia Clinical Impression Pt demonstrates a severe oropharygneal dysphagia with decreased duration and magnitude of hyolaryngeal excursion leading to severe oropharyngeal residuals that are aspirated with and without sensation in further swallow attempts. Base of tongue propulsion of bolus is also poor and epiglottis does not deflect for airway protection. Thin liquids pose a severe risk of aspiration and nectar thick liquids reduce severity of aspiration events, but are still likely to  be aspirated due to increased residuals with thick viscosity. A chin tuck significantly improves bolus transit if fully tucked against chest. Would suspect that carry over for this will be poor, but will trial dys 3 solids and nectar thick liquids with full supervision for a chin tuck to determine if it is achievable.  Impact on safety and function Severe aspiration risk   CHL IP TREATMENT RECOMMENDATION 11/30/2016 Treatment Recommendations Therapy as outlined in treatment plan below   Prognosis 11/30/2016 Prognosis for Safe Diet Advancement Guarded Barriers to Reach Goals Severity of deficits;Time post onset Barriers/Prognosis Comment -- CHL IP DIET RECOMMENDATION 11/30/2016 SLP Diet Recommendations Dysphagia 3 (Mech soft) solids;Nectar thick liquid Liquid Administration via Cup;Straw Medication Administration Whole meds with puree Compensations Chin tuck;Slow rate;Small sips/bites;Minimize environmental distractions Postural Changes Remain semi-upright after after feeds/meals (Comment);Seated upright at 90 degrees   CHL IP OTHER RECOMMENDATIONS 11/30/2016 Recommended Consults -- Oral Care Recommendations Oral care BID Other Recommendations Order thickener from pharmacy  CHL IP FOLLOW UP RECOMMENDATIONS 11/30/2016 Follow up Recommendations Skilled Nursing facility   [facility] IP FREQUENCY AND DURATION 11/30/2016 Speech Therapy Frequency (ACUTE ONLY) min 2x/week Treatment Duration 2 weeks      CHL IP ORAL PHASE 11/30/2016 Oral Phase WFL Oral - Pudding Teaspoon -- Oral - Pudding Cup -- Oral - Honey Teaspoon -- Oral - Honey Cup -- Oral - Nectar Teaspoon -- Oral - Nectar Cup -- Oral - Nectar Straw -- Oral - Thin Teaspoon -- Oral - Thin Cup -- Oral - Thin Straw -- Oral - Puree -- Oral - Mech Soft -- Oral - Regular -- Oral - Multi-Consistency -- Oral - Pill -- Oral Phase - Comment --  CHL IP PHARYNGEAL PHASE 11/30/2016 Pharyngeal Phase Impaired Pharyngeal- Pudding Teaspoon -- Pharyngeal -- Pharyngeal- Pudding Cup -- Pharyngeal -- Pharyngeal-  Honey Teaspoon -- Pharyngeal -- Pharyngeal- Honey Cup -- Pharyngeal -- Pharyngeal- Nectar Teaspoon -- Pharyngeal -- Pharyngeal- Nectar Cup Reduced epiglottic inversion;Reduced anterior laryngeal mobility;Reduced airway/laryngeal closure;Reduced tongue base retraction;Delayed swallow initiation-pyriform sinuses;Pharyngeal residue - valleculae;Pharyngeal residue - pyriform;Compensatory strategies attempted (with notebox) Pharyngeal -- Pharyngeal- Nectar Straw Reduced epiglottic inversion;Reduced anterior laryngeal mobility;Reduced airway/laryngeal closure;Reduced tongue base retraction;Delayed swallow initiation-pyriform sinuses;Pharyngeal residue - valleculae;Pharyngeal residue - pyriform;Compensatory strategies attempted (with notebox);Other (Comment) Pharyngeal -- Pharyngeal- Thin Teaspoon -- Pharyngeal -- Pharyngeal- Thin Cup Reduced epiglottic inversion;Reduced anterior laryngeal mobility;Reduced airway/laryngeal closure;Reduced tongue base retraction;Delayed swallow initiation-pyriform sinuses;Pharyngeal residue - valleculae;Pharyngeal residue - pyriform;Penetration/Apiration after swallow;Significant aspiration (Amount) Pharyngeal Material enters airway, passes BELOW cords and not ejected out despite cough attempt by patient Pharyngeal- Thin Straw Reduced epiglottic inversion;Reduced anterior laryngeal mobility;Reduced airway/laryngeal closure;Reduced tongue base retraction;Delayed swallow initiation-pyriform sinuses;Pharyngeal residue - valleculae;Pharyngeal residue - pyriform;Compensatory strategies attempted (with notebox);Penetration/Aspiration during swallow Pharyngeal Material enters airway, passes BELOW cords without attempt by patient to eject out (silent aspiration) Pharyngeal- Puree Reduced epiglottic inversion;Reduced anterior laryngeal mobility;Reduced airway/laryngeal closure;Reduced tongue base retraction;Pharyngeal residue - valleculae;Pharyngeal residue - pyriform;Compensatory strategies  attempted (with notebox);Delayed swallow initiation-vallecula Pharyngeal -- Pharyngeal- Mechanical Soft Reduced epiglottic inversion;Reduced anterior laryngeal mobility;Reduced airway/laryngeal closure;Reduced tongue base retraction;Pharyngeal residue - valleculae;Pharyngeal residue - pyriform;Compensatory strategies attempted (with notebox);Delayed swallow initiation-vallecula Pharyngeal -- Pharyngeal- Regular -- Pharyngeal -- Pharyngeal- Multi-consistency -- Pharyngeal -- Pharyngeal- Pill Reduced epiglottic inversion;Reduced anterior laryngeal mobility;Reduced airway/laryngeal closure;Reduced tongue base retraction;Pharyngeal residue - valleculae;Pharyngeal residue - pyriform;Compensatory strategies attempted (with notebox);Delayed swallow initiation-vallecula Pharyngeal -- Pharyngeal Comment --  No flowsheet data found. CHL IP GO 11/29/2016 Functional Assessment Tool Used clinical judgement Functional Limitations Swallowing Swallow Current Status [id number]) CJ Swallow Goal Status (U0454) CJ Swallow Discharge Status (U9811) (None) Motor Speech Current Status [phone]) (None) Motor Speech Goal Status (G9562) (None) Motor Speech Goal Status (Z3086) (None) Spoken Language Comprehension Current Status (V7846) (None) Spoken Language Comprehension Goal Status (N6295) (None) Spoken Language Comprehension Discharge Status [phone]) (None) Spoken Language Expression Current Status [id number]) (None) Spoken Language Expression Goal Status [id number]) (None) Spoken Language Expression Discharge Status [phone]) (None) Attention Current Status (I3474) (None) Attention Goal Status (Q5956) (None) Attention Discharge Status [phone]) (None) Memory Current Status (E3329) (None) Memory Goal Status (J1884) (None) Memory Discharge Status (Z6606) (None) Voice Current Status (T0160) (None) Voice Goal Status (F0932) (None) Voice Discharge Status (T5573) (None) Other Speech-Language Pathology Functional Limitation [id number]) (None) Other Speech-Language  Pathology Functional Limitation Goal Status (K2706) (None) Other Speech-Language Pathology Functional Limitation Discharge Status [phone]) (None) Harlon Ditty, MA CCC-SLP [phone] [place], Riley Nearing 11/30/2016, 2:50 PM              Dg Femur Min  2 Views Left  Result Date: 11/27/2016 CLINICAL DATA:  Larey Seat while attempting to get out of bed unassisted today. EXAM: LEFT FEMUR 2 VIEWS COMPARISON:  None. FINDINGS: There is a subcapital left hip fracture. Remainder of the femur is intact. No dislocation. No bone lesion or bony destruction. IMPRESSION: Subcapital left hip fracture Electronically Signed   By: Ellery Plunk M.D.   On: 11/27/2016 01:13     LOS: 11 days   Jeoffrey Massed, MD  [facility] Pager:[phone]  If 7PM-7AM, please contact night-coverage [email or web address] Password [email or web address] 12/08/2016, 11:40 AM

## 2016-12-08 NOTE — Progress Notes (Signed)
Speech Language Pathology Treatment: Dysphagia  Patient Details Name: Curtis Keller MRN: [id number] DOB: Jan 24, 1956 Today's Date: 12/08/2016 Time: 3704-8889 SLP Time Calculation (min) (ACUTE ONLY): 8 min  Assessment / Plan / Recommendation Clinical Impression  Pt demonstrates tolerance of nectar thick liquids, but unwillingness to follow cueing for safe precautions and is quite impulsive with intake. Do no expect improvement for diet upgrade and given plan of care, would expect pt to continue PO intake even with risk of aspiration. For now, recommend pt continue a dys 3/nectar thick diet as best diet in setting of chronic dysphagia with persistent risk of aspiration. No acute SLP f/u will benefit the pt at this time, will sign off.    HPI HPI: Pt. with PMH of HTN, chronic systolic CHF, chronic respiratory failure on 3L of oxygen, diabetes mellitus, recurrent fall, chronic anticoagulation, dyslipidemia; admitted on 11/26/2016, with complaint of a mechanical fall, was found to have closed left hip fracture.Patient developed aspiration pneumonia in the hospital. Currently surgery is on hold until pneumonia is better. Per MD pt was on dys 2/nectar thick liquids at SNF, but has been on thin liquids here. Pt had an MBS during admission to [facility] in 2017, but record not available. Pt also had a trach at that time. Reported has a history of dysphagia due to prior CVAs.  MBS completed 2/7 with recommendation for dys 3/NTL. ST following for diet tolerance assessment and education.      SLP Plan  All goals met     Recommendations  Diet recommendations: Nectar-thick liquid;Dysphagia 3 (mechanical soft) Liquids provided via: Teaspoon;Cup;Straw Medication Administration: Whole meds with liquid Supervision: Full supervision/cueing for compensatory strategies Compensations: Minimize environmental distractions;Slow rate;Small sips/bites;Use straw to facilitate chin tuck;Multiple dry  swallows after each bite/sip Postural Changes and/or Swallow Maneuvers: Seated upright 90 degrees;Upright 30-60 min after meal                Plan: All goals met       GO                Jandiel Magallanes, Katherene Ponto 12/08/2016, 2:25 PM

## 2016-12-08 NOTE — Progress Notes (Signed)
Initial Nutrition Assessment  DOCUMENTATION CODES:   Obesity unspecified  INTERVENTION:   -Mighty Shake II TID between meals  NUTRITION DIAGNOSIS:   Inadequate oral intake related to lethargy/confusion, dysphagia as evidenced by meal completion < 50%.  GOAL:   Patient will meet greater than or equal to 90% of their needs  MONITOR:   PO intake, Supplement acceptance, Diet advancement, Labs, Weight trends, Skin, I & O's  REASON FOR ASSESSMENT:   Malnutrition Screening Tool    ASSESSMENT:   Curtis Keller is a 61 y.o. male with medical history significant of HTN, systolic CHF EF 25-30%, CAD, DM type II, AAA, COPD, chronic respiratory failure on 2 L of [place] oxygen at night CVA with residual left-sided hemiparesis/dysphagia, apical thrombus on Coumadin; who presents from nursing facility after having a fall out of bed around 12 noon yesterday and patient reports complaints of left hip pain. X-rays have facility noted a left hip fracture.  Pt admitted with lt hip fx secondary to fall.   2/5- transferred from floor to SDU, due to rapid response 2/6- s/p BSE- downgraded to dysphagia 3 diet with nectar thick liquids due to severe aspiration risk 2/7- s/p MBSS- recommend continue dysphagia 3 diet with nectar thick liquids; pt remains at severe aspiration risk 2/12- per MD notes, plan to hold surgery secondary to medical decline  Pt sleeping soundly at time of visit. No family at bedside. RD did not disturb. Nutrition-focused physical exam deferred at this time, due to pt covered in multiple blankets and several pillows positioned for pt comfort.   Meal completion poor; PO: 25-50%. Suspect intake dependent on pt's level of alertness.   Reviewed wt hx; no wt loss noted.   Palliative care team following closely. Pt with very poor prognosis; goals of care discussions ongoing.  Labs reviewed: CBGS: 150-307.  Diet Order:  DIET DYS 3 Room service appropriate? Yes with Assist; Fluid  consistency: Nectar Thick  Skin:  Reviewed, no issues  Last BM:  12/07/16  Height:   Ht Readings from Last 1 Encounters:  11/28/16 6\' 2"  (1.88 m)    Weight:   Wt Readings from Last 1 Encounters:  12/08/16 237 lb (107.5 kg)    Ideal Body Weight:  86.4 kg  BMI:  Body mass index is 30.43 kg/m.  Estimated Nutritional Needs:   Kcal:  2000-2200  Protein:  110-115 grams  Fluid:  2.0-2.2. L  EDUCATION NEEDS:   No education needs identified at this time  Ytzel Gubler A. Mayford Knife, RD, LDN, CDE Pager: [phone] After hours Pager: [phone]

## 2016-12-08 NOTE — Progress Notes (Signed)
Daily Progress Note   Patient Name: Curtis Keller       Date: 12/08/2016 DOB: 1956/04/18  Age: 61 y.o. MRN#: [id number] Attending Physician: Maretta Bees, MD Primary Care Physician: Kirt Boys, DO Admit Date: 11/26/2016  Reason for Consultation/Follow-up: Establishing goals of care  Subjective: Curtis Keller is sleeping and appears comfortable.   Length of Stay: 11  Current Medications: Scheduled Meds:  . ampicillin-sulbactam (UNASYN) IV  3 g Intravenous Q6H  . aspirin EC  81 mg Oral Daily  . atorvastatin  40 mg Oral QHS  . busPIRone  10 mg Oral BID  . divalproex  125 mg Oral TID  . famotidine  20 mg Oral BID  . feeding supplement (ENSURE ENLIVE)  237 mL Oral BID BM  . FLUoxetine  60 mg Oral Daily  . heparin subcutaneous  5,000 Units Subcutaneous Q8H  . hydrALAZINE  12.5 mg Oral q12n4p  . insulin aspart  0-9 Units Subcutaneous TID WC  . ipratropium-albuterol  3 mL Nebulization TID  . mouth rinse  15 mL Mouth Rinse BID  . metoprolol succinate  25 mg Oral Daily  . nicotine  21 mg Transdermal Daily  . polyethylene glycol  17 g Oral Daily  . potassium chloride  20 mEq Oral Daily  . pregabalin  75 mg Oral Daily  . senna-docusate  1 tablet Oral BID    Continuous Infusions: . sodium chloride Stopped (12/08/16 1113)    PRN Meds: acetaminophen, fentaNYL (SUBLIMAZE) injection, haloperidol lactate, methocarbamol, oxyCODONE-acetaminophen, RESOURCE THICKENUP CLEAR  Physical Exam  Constitutional: He is oriented to person, place, and time. He appears well-developed. He appears lethargic.  HENT:  Head: Normocephalic and atraumatic.  Cardiovascular: Normal rate.   Occasional PVC  Pulmonary/Chest: Effort normal. No accessory muscle usage. No tachypnea. No respiratory distress.  He has rales.  Wet cough, weak, unable to expectorate.   Abdominal: Normal appearance.  Neurological: He is oriented to person, place, and time. He appears lethargic.  Nursing note and vitals reviewed.           Vital Signs: BP 129/85 (BP Location: Left Arm)   Pulse 81   Temp 98.3 F (36.8 C) (Axillary)   Resp 19   Ht 6\' 2"  (1.88 m)   Wt 107.5 kg (237 lb)   SpO2 92%  BMI 30.43 kg/m  SpO2: SpO2: 92 % O2 Device: O2 Device: Nasal Cannula O2 Flow Rate: O2 Flow Rate (L/min): 3 L/min  Intake/output summary:   Intake/Output Summary (Last 24 hours) at 12/08/16 1120 Last data filed at 12/08/16 1000  Gross per 24 hour  Intake          1929.99 ml  Output             2000 ml  Net           -70.01 ml   LBM: Last BM Date: 12/07/16 Baseline Weight: Weight: 102.1 kg (225 lb) Most recent weight: Weight: 107.5 kg (237 lb)       Palliative Assessment/Data:    Flowsheet Rows   Flowsheet Row Most Recent Value  Intake Tab  Referral Department  Hospitalist  Unit at Time of Referral  ICU  Palliative Care Primary Diagnosis  Cardiac  Date Notified  11/30/16  Palliative Care Type  New Palliative care  Reason for referral  Clarify Goals of Care  Date of Admission  11/26/16  Date first seen by Palliative Care  11/30/16  # of days Palliative referral response time  0 Day(s)  # of days IP prior to Palliative referral  4  Clinical Assessment  Psychosocial & Spiritual Assessment  Palliative Care Outcomes      Patient Active Problem List   Diagnosis Date Noted  . DNR (do not resuscitate)   . Ischemic cardiomyopathy   . Goals of care, counseling/discussion   . Palliative care encounter   . Acute systolic congestive heart failure (HCC)   . Acute and chronic respiratory failure with hypoxia (HCC) 11/28/2016  . Hip fracture (HCC) 11/27/2016  . Closed nondisplaced intertrochanteric fracture of left femur (HCC) 11/27/2016  . Chronic respiratory failure (HCC) 11/27/2016  . Fall at  nursing home 07/17/2016  . Diabetes mellitus, type II, insulin dependent (HCC) 06/12/2016  . GERD (gastroesophageal reflux disease) 05/07/2016  . COPD (chronic obstructive pulmonary disease) (HCC) 04/18/2016  . Chronic anticoagulation 03/22/2016  . S/P splenectomy 03/13/2016  . PEG (percutaneous endoscopic gastrostomy) status (HCC) 03/13/2016  . MRSA (methicillin resistant Staphylococcus aureus) septicemia (HCC) 03/13/2016  . Aspiration pneumonia (HCC) 03/13/2016  . Dysphagia 03/13/2016  . Acute blood loss anemia 03/13/2016  . Hyperlipidemia 03/13/2016  . Heart failure, chronic systolic (HCC)   . Hypertension   . Depression   . Status post tracheostomy (HCC)   . Cardiomyopathy (HCC)   . Apical mural thrombus   . Protein calorie malnutrition (HCC)   . Intraparenchymal hematoma of brain due to trauma [facility])     Palliative Care Assessment & Plan   HPI: 61 y.o. male  with past medical history ofHTN, systolic CHF EF 25-30%,CAD, DM type II, AAA, COPD, chronic respiratory failure [place] at [place], CVA with residual left-sided hemiparesis/dysphagia, apical thrombus on Coumadin, splenectomy s/p fall with complications resulting in tracheostomy x 6 weeks admitted on 11/26/2016 with left hip fracture s/p fall at SNF. Awaiting hip repair d/t concern for respiratory status aspiration pneumonia and heart failure exacerbation. High risk for post-op delirium as well as he is already exhibiting signs of delirium. Surgery again postponed 2/12 d/t worsening renal function. WBC also more elevated and making little to no progress or improvement. 12/06/16 worsening with further cardiac, respiratory and renal failure.    Assessment: Curtis Keller is sleeping today. I spoke with daughter Archie Patten (via telephone) and separately with sister Massie Bougie. Explained that his kidney function and  WBC are worse and CXR also remains bad. Archie Patten very much understands his poor health condition and is coming from [place] and will be here by Friday. She tells me about her mother suffering at EOL and she does not want that for her father. She has spoken with him and understands that he is not fearful of death but in fact very accepting if this is his time. She wants DNR, not to escalate treatment, and to continue current treatments.   [name] I spoke with and mainly explained his condition today and offered support. Massie Bougie says that she has been looking into reserving a mausoleum for Mr. Choquette - this is much progression as this shows that she has began to accept he is at EOL.   Recommendations/Plan:  Pain: Continue percocet and fentanyl prn.   Delirium: Continue haldol prn.   Goals of Care and Additional Recommendations:  Limitations on Scope of Treatment: Full Scope Treatment  Code Status:  DNR  Prognosis:   Very poor - worsening every day and likely approaching EOL.   Discharge Planning:  To Be Determined  Discussed with Dr. Jerral Ralph.   Thank you for allowing the Palliative Medicine Team to assist in the care of this patient.   Total Time Prolonged Time Billed  no      Greater than 50%  of this time was spent counseling and coordinating care related to the above assessment and plan.  Yong Channel, NP Palliative Medicine Team Pager # [phone] (M-F 8a-5p) Team Phone # [phone] (Nights/Weekends)

## 2016-12-09 LAB — CBC
HCT: 43.1 % (ref 39.0–52.0)
Hemoglobin: 14 g/dL (ref 13.0–17.0)
MCH: 33.3 pg (ref 26.0–34.0)
MCHC: 32.5 g/dL (ref 30.0–36.0)
MCV: 102.6 fL — AB (ref 78.0–100.0)
PLATELETS: 199 10*3/uL (ref 150–400)
RBC: 4.2 MIL/uL — AB (ref 4.22–5.81)
RDW: 16.3 % — AB (ref 11.5–15.5)
WBC: 20.4 10*3/uL — AB (ref 4.0–10.5)

## 2016-12-09 LAB — BASIC METABOLIC PANEL
Anion gap: 12 (ref 5–15)
BUN: 83 mg/dL — AB (ref 6–20)
CHLORIDE: 112 mmol/L — AB (ref 101–111)
CO2: 30 mmol/L (ref 22–32)
CREATININE: 2.79 mg/dL — AB (ref 0.61–1.24)
Calcium: 8.8 mg/dL — ABNORMAL LOW (ref 8.9–10.3)
GFR calc Af Amer: 27 mL/min — ABNORMAL LOW (ref >60)
GFR calc non Af Amer: 23 mL/min — ABNORMAL LOW (ref >60)
GLUCOSE: 135 mg/dL — AB (ref 65–99)
POTASSIUM: 4.3 mmol/L (ref 3.5–5.1)
SODIUM: 154 mmol/L — AB (ref 135–145)

## 2016-12-09 LAB — GLUCOSE, CAPILLARY
GLUCOSE-CAPILLARY: 143 mg/dL — AB (ref 65–99)
GLUCOSE-CAPILLARY: 129 mg/dL — AB (ref 65–99)
Glucose-Capillary: 196 mg/dL — ABNORMAL HIGH (ref 65–99)
Glucose-Capillary: 157 mg/dL — ABNORMAL HIGH (ref 65–99)

## 2016-12-09 MED ORDER — OXYCODONE HCL 5 MG PO TABS
5.0000 mg | ORAL_TABLET | Freq: Four times a day (QID) | ORAL | Status: DC | PRN
  Administered 2016-12-10 (×2): 5 mg via ORAL
  Filled 2016-12-09 (×2): qty 1

## 2016-12-09 MED ORDER — FUROSEMIDE 10 MG/ML IJ SOLN
60.0000 mg | Freq: Once | INTRAMUSCULAR | Status: AC
  Administered 2016-12-09: 60 mg via INTRAVENOUS
  Filled 2016-12-09: qty 6

## 2016-12-09 MED ORDER — MORPHINE SULFATE (PF) 2 MG/ML IV SOLN: 2.0000 mg | INTRAVENOUS | Status: DC | PRN

## 2016-12-09 MED ORDER — OXYCODONE-ACETAMINOPHEN 5-325 MG PO TABS
1.0000 | ORAL_TABLET | Freq: Four times a day (QID) | ORAL | Status: DC | PRN
  Administered 2016-12-09: 2 via ORAL
  Filled 2016-12-09: qty 2

## 2016-12-09 NOTE — Progress Notes (Signed)
amion                        PROGRESS NOTE        PATIENT DETAILS Name: Curtis Keller Age: 61 y.o. Sex: male Date of Birth: 12-20-1955 Admit Date: 11/26/2016 Admitting Physician Clydie Braun, MD ZOX:[id number], Maxine Glenn, DO  Brief Narrative: Patient is a 61 y.o. male with history chronic systolic heart failure (EF 25-30%), history of LV thrombus, prior CVA with chronic left-sided hemiplegia-multiple falls over the past few years causing splenic rupture-presented to the hospital on 2/3 following a mechanical fall, found to have a left hip fracture. Unfortunately hospital course has been complicated by development of acute hypoxic respiratory failure, felt due to aspiration PNA vs acute systolic CHF, worsening renal function and hyponatremia. After extensive discussion with patient, family-he is now a DO NOT RESUSCITATE.  Subjective: Essentially unchanged-still very weak and frail. Per RN-oxygen requirements slowly going up-he is on high flow O2 10 L. Complains of pain in his left foot.   Assessment/Plan: Closed Left hip fracture: Secondary to a mechanical fall-unfortunately before hip repair could be attempted-patient developed hypoxic resp failure due to aspiration pneumonia and acute systolic heart failure. Although he initially improved with antibiotics and Lasix, he likely developed aspiration pneumonitis-and seems to be overtly aspirating at times. His oxygen requirements have slowly increased-he is now on high flow oxygen. Palliative care is following, after extensive discussion with patient, family patient is now a DNR. I do not think with him slowly deteriorating-he will be able to tolerate any sort of hip surgery. Awaiting arrival of his daughter from [place], we will see if family is ready to transition him to comfort measures.    Acute hypoxemic respiratory failure secondary to aspiration pneumonia and acute on chronic systolic heart failure: Patient had a aspiration event on 2/5  requiring transfer to stepdown and 100% nonrebreather mask. He was subsequently treated with IV antibiotics and IV Lasix. He initially improved and his oxygen was titrated down to 3 L via nasal cannula. He unfortunately seems to have worsened over the past few days, and is overtly aspirating-inspite of empiric antibiotics, speech therapy evaluation etc. He is now on high flow oxygen around 10 L. Very poor overall prognoses-he continues to slowly deteriorate everyday-with worsening renal function and hyponatremia. Awaiting arrival of patient's daughter from out of town to see if family is ready to transition him to comfort measures.  Acute kidney injury: Suspect hemodynamic mediated acute kidney injury due to ongoing aspiration pneumonitis, congestive heart failure, diuretic use. Creatinine still elevated despite gentle hydration. Continue to follow electrolytes.   Hypernatremia: probably due to decrease in oral intake and diuretic use-difficult situation-he was briefly hydrated with D5W-unfortunately his hyponatremia continues to worsen. Given concern for concurrent CHF, with increasing weight-all IV fluids have been discontinued. He does have some oral intake but clearly not enough to sustain/correct his electrolytes. His prognosis is very poor, has noted above, we are awaiting arrival of his daughter from out of town to see if we can transition him to comfort measures.  Dysphagia: Speech therapy following, underwent modified barium swallow-this is a chronic issue-patient has a prior history of CVA-he previously had a tube in place. Recommendations are to try a dysphagia 3 solids with nectar thick liquids. Spoke with patient's sister over the phone on 2/10, she is aware of the aspiration risk and wishes to continue with Dys 3 diet with nectar thick liquid. Unfortunately, per RN-patient's sister  has been occasionally giving patient thin liquids. See below regarding poor prognoses and end-of-life issue  discussion  History of left apical thrombus: Previously on Coumadin-currently on hold for surgery. Given high risk for venous thromboembolism post surgery, suspect will require short-term anticoagulation. Long discussion with patient's sister at bedside on 2/7, she would prefer short-term anticoagulation post surgery and is willing to accept all risks. However with continued slow deterioration, I doubt this is going to be necessary. His overall prognosis is poor, and I suspect he may close to end of life.  Acute metabolic encephalopathy/dementia: Mental status continues to wax and wane-at times he is completely awake and alert and at times he is confused.   Leukocytosis: Initially thought to be secondary to steroids, but white count is still persistently elevated. This is not thought to be secondary to aspiration pneumonia-he remains on Unasyn.   Hx of COPD: not in exacerbation-see above.   History of CVA with chronic left sided weakness: Previously on anticoagulation-left-sided deficits are unchanged. Continue statin. See above regarding anticoagulation  GERD: Continue Pepcid  Type 2 diabetes: CBGs stable-continue SSI  History of anxiety/depression/mild dementia: Continue Depakote, fluoxetine and BuSpar.  Frequent falls: Has had frequent falls in the past few years-one episode of fall resulted in a splenic laceration requiring splenectomy. Unfortunately, patient also has a history of LV thrombus and a CVA with left hemiplegia. Although he is a poor long term candidate, he is at significant risk of having venous thromboembolism post-hip surgery. Long discussion with the patient's sister on 2/7(who is a nurse at bedside)-she is agreeable to restart anticoagulation post operatively for the short-term.  Palliative care: Unfortunate 60 year old male with chronic systolic heart failure, prior history of LV thrombus, CVA with left hemiplegia-recurrent falls-had splenectomy a few years back due to  splenic rupture-presented with left hip fracture. Unfortunately before hip repair could be performed, patient developed acute hypoxemic respiratory failure due to presumed aspiration pneumonia and acute on chronic systolic heart failure. Further hospital course was complicated by worsening renal function and hyponatremia. He subsequently also has started to have increasing oxygen requirements, and has started to overtly aspirate and is pooling his secretions. Palliative care has been following the patient. This M.D. and the palliative care in the has had multiple discussions with the patient, his sister and his daughter over the phone (in [place]). Family is aware of very poor overall prognoses, and that patient may not survive this hospitalization. After multiple discussions, on 2/14 family agreed for a DO NOT RESUSCITATE order. He unfortunately continues to deteriorate- family aware that patient may require transition to full comfort measures/residential hospice. Awaiting arrival of patient's daughter from [place]-I spoke to over the phone-she is planning to be in the hospital later this afternoon.  DVT Prophylaxis: Prophylactic Heparin   Code Status: DNR  Family Communication: None at bedside this am-spoke with daughter over the phone-she will here in [place] later this afternoon  Disposition Plan: Remain inpatient-remain in SDU-susepct will require residential hospice on discharge-awaiting arrival of family today  Antimicrobial agents: Anti-infectives    Start     Dose/Rate Route Frequency Ordered Stop   12/06/16 1230  Ampicillin-Sulbactam (UNASYN) 3 g in sodium chloride 0.9 % 100 mL IVPB     3 g 200 mL/hr over 30 Minutes Intravenous Every 6 hours 12/06/16 1139     11/28/16 1530  piperacillin-tazobactam (ZOSYN) IVPB 3.375 g     3.375 g 12.5 mL/hr over 240 Minutes Intravenous Every 8 hours 11/28/16 1518 12/04/16 2359  11/28/16 1530  vancomycin (VANCOCIN) IVPB 750 mg/150 ml premix  Status:   Discontinued     750 mg 150 mL/hr over 60 Minutes Intravenous Every 12 hours 11/28/16 1518 12/01/16 1031   11/28/16 1520  ceFAZolin (ANCEF) IVPB 2g/100 mL premix  Status:  Discontinued     2 g 200 mL/hr over 30 Minutes Intravenous To ShortStay Surgical 11/28/16 1247 11/28/16 2334   11/28/16 1000  piperacillin-tazobactam (ZOSYN) IVPB 3.375 g  Status:  Discontinued     3.375 g 12.5 mL/hr over 240 Minutes Intravenous Every 8 hours 11/28/16 0832 11/28/16 1518   11/28/16 1000  vancomycin (VANCOCIN) IVPB 750 mg/150 ml premix  Status:  Discontinued     750 mg 150 mL/hr over 60 Minutes Intravenous Every 12 hours 11/28/16 0832 11/28/16 1518      Procedures: Echo 2/5 - Very limited image quality, however LVEF is severely impaired,   estimated at 25-30% with akinesis of all of the apical segments   and mid anteroseptal, anterior and anterolateral segments.   There is no thrombus on the contrast images.   A cardiac MRI is recommended for further LVEF evaluation.   CONSULTS:  cardiology and orthopedic surgery  Time spent: 35- minutes-Greater than 50% of this time was spent in counseling, explanation of diagnosis, planning of further management, and coordination of care.  MEDICATIONS: Scheduled Meds: . ampicillin-sulbactam (UNASYN) IV  3 g Intravenous Q6H  . aspirin EC  81 mg Oral Daily  . atorvastatin  40 mg Oral QHS  . busPIRone  10 mg Oral BID  . divalproex  125 mg Oral TID  . famotidine  20 mg Oral BID  . FLUoxetine  60 mg Oral Daily  . heparin subcutaneous  5,000 Units Subcutaneous Q8H  . hydrALAZINE  12.5 mg Oral q12n4p  . insulin aspart  0-9 Units Subcutaneous TID WC  . ipratropium-albuterol  3 mL Nebulization TID  . mouth rinse  15 mL Mouth Rinse BID  . metoprolol succinate  25 mg Oral Daily  . polyethylene glycol  17 g Oral Daily  . potassium chloride  20 mEq Oral Daily  . pregabalin  75 mg Oral Daily  . senna-docusate  1 tablet Oral BID   Continuous Infusions: . sodium  chloride Stopped (12/08/16 1113)   PRN Meds:.acetaminophen, fentaNYL (SUBLIMAZE) injection, glycopyrrolate, haloperidol lactate, methocarbamol, morphine injection, oxyCODONE-acetaminophen, RESOURCE THICKENUP CLEAR   PHYSICAL EXAM: Vital signs: Vitals:   12/08/16 2300 12/09/16 0300 12/09/16 0500 12/09/16 0749  BP: (!) 136/102 (!) 124/99  139/82  Pulse: 86 87  93  Resp: 20 (!) 21  20  Temp: 98 F (36.7 C)   99.5 F (37.5 C)  TempSrc: Axillary Axillary  Oral  SpO2: 92% 95%  90%  Weight:   109.6 kg (241 lb 11.2 oz)   Height:       Filed Weights   12/07/16 0431 12/08/16 0500 12/09/16 0500  Weight: 106.6 kg (235 lb 1.6 oz) 107.5 kg (237 lb) 109.6 kg (241 lb 11.2 oz)   Body mass index is 31.03 kg/m.   General appearance :Awake, alert, Looks very frail and slightly lethargic. Eyes:, pupils equally reactive to light and accomodation HEENT: Atraumatic and Normocephalic Neck: supple, no JVD. No cervical lymphadenopathy. Resp:Good air entry bilaterally, rales all over upper airway sounds.Transmitted upper airway sounds CVS: S1 S2 regular GI: Bowel sounds present, Non tender and not distended with no gaurding, rigidity or rebound.No organomegaly Extremities: B/L Lower Ext shows no edema, both legs are warm  to touch Neurology: Left hemiplegia Musculoskeletal:No digital cyanosis Skin:No Rash, warm and dry Wounds:N/A  I have personally reviewed following labs and imaging studies  LABORATORY DATA: CBC:  Recent Labs Lab 12/05/16 0209 12/06/16 0151 12/07/16 0312 12/08/16 0738 12/09/16 0329  WBC 22.3* 18.1* 27.6* 29.4* 20.4*  HGB 16.4 15.8 16.1 14.4 14.0  HCT 49.4 48.3 49.3 44.6 43.1  MCV 100.6* 101.7* 101.6* 102.8* 102.6*  PLT 242 245 227 197 199    Basic Metabolic Panel:  Recent Labs Lab 12/05/16 0209 12/06/16 0151 12/07/16 0312 12/08/16 0738 12/09/16 0329  NA 151* 153* 153* 155* 154*  K 4.3 4.5 4.6 4.0 4.3  CL 109 109 110 113* 112*  CO2 31 31 31  32 30  GLUCOSE  200* 146* 207* 172* 135*  BUN 87* 88* 86* 85* 83*  CREATININE 2.32* 2.53* 2.69* 2.71* 2.79*  CALCIUM 9.5 9.2 9.1 9.0 8.8*    GFR: Estimated Creatinine Clearance: 36.7 mL/min (by C-G formula based on SCr of 2.79 mg/dL (H)).  Liver Function Tests:  Recent Labs Lab 12/08/16 0738  AST 21  ALT 17  ALKPHOS 84  BILITOT 0.6  PROT 6.3*  ALBUMIN 1.9*   No results for input(s): LIPASE, AMYLASE in the last 168 hours. No results for input(s): AMMONIA in the last 168 hours.  Coagulation Profile:  Recent Labs Lab 12/06/16 0151  INR 1.18    Cardiac Enzymes: No results for input(s): CKTOTAL, CKMB, CKMBINDEX, TROPONINI in the last 168 hours.  BNP (last 3 results) No results for input(s): PROBNP in the last 8760 hours.  HbA1C: No results for input(s): HGBA1C in the last 72 hours.  CBG:  Recent Labs Lab 12/08/16 0736 12/08/16 1146 12/08/16 1619 12/08/16 2128 12/09/16 0815  GLUCAP 150* 221* 165* 279* 143*    Lipid Profile: No results for input(s): CHOL, HDL, LDLCALC, TRIG, CHOLHDL, LDLDIRECT in the last 72 hours.  Thyroid Function Tests: No results for input(s): TSH, T4TOTAL, FREET4, T3FREE, THYROIDAB in the last 72 hours.  Anemia Panel: No results for input(s): VITAMINB12, FOLATE, FERRITIN, TIBC, IRON, RETICCTPCT in the last 72 hours.  Urine analysis:    Component Value Date/Time   COLORURINE YELLOW 11/27/2016 0355   APPEARANCEUR CLEAR 11/27/2016 0355   LABSPEC 1.015 11/27/2016 0355   PHURINE 6.0 11/27/2016 0355   GLUCOSEU NEGATIVE 11/27/2016 0355   HGBUR LARGE (A) 11/27/2016 0355   BILIRUBINUR NEGATIVE 11/27/2016 0355   KETONESUR NEGATIVE 11/27/2016 0355   PROTEINUR 100 (A) 11/27/2016 0355   NITRITE NEGATIVE 11/27/2016 0355   LEUKOCYTESUR NEGATIVE 11/27/2016 0355    Sepsis Labs: Lactic Acid, Venous    Component Value Date/Time   LATICACIDVEN 1.9 11/28/2016 2023    MICROBIOLOGY: No results found for this or any previous visit (from the past 240  hour(s)).  RADIOLOGY STUDIES/RESULTS: Dg Chest 1 View  Result Date: 11/27/2016 CLINICAL DATA:  Larey Seat today.  Left hip fracture. EXAM: CHEST 1 VIEW COMPARISON:  02/24/2016 FINDINGS: Diffuse interstitial coarsening is probably chronic. No consolidation. No effusion. No pneumothorax. New unchanged hilar, mediastinal and cardiac contours. IMPRESSION: Chronic appearing interstitial coarsening. No consolidation or effusion. Electronically Signed   By: Ellery Plunk M.D.   On: 11/27/2016 01:14   Dg Pelvis 1-2 Views  Result Date: 11/27/2016 CLINICAL DATA:  Larey Seat today while trying to get out of bed unassisted. EXAM: PELVIS - 1-2 VIEW COMPARISON:  None. FINDINGS: There is an acute subcapital left hip fracture. No dislocation. No bone lesion or bony destruction. Pelvis appears intact. IMPRESSION: Subcapital left  hip fracture Electronically Signed   By: Ellery Plunk M.D.   On: 11/27/2016 01:13   Ct Head Wo Contrast  Result Date: 11/27/2016 CLINICAL DATA:  Larey Seat getting out of bed today, acute LEFT hip fracture. History of hypertension, stroke. EXAM: CT HEAD WITHOUT CONTRAST TECHNIQUE: Contiguous axial images were obtained from the base of the skull through the vertex without intravenous contrast. COMPARISON:  CT HEAD January 26, 2016 FINDINGS: BRAIN: No intraparenchymal hemorrhage, mass effect, midline shift or acute large vascular territory infarcts. Old RIGHT greater than LEFT cerebellar infarcts, severe brachium contrast atrophy. Old RIGHT greater than LEFT pontine infarcts. Old LEFT thalamus and bilateral basal ganglia lacunar infarcts. Old small RIGHT occipital lobe infarct. LEFT inferior temporal lobe encephalomalacia is likely posttraumatic. Mild to moderate white matter changes compatible with chronic small vessel ischemic disease. No abnormal extra-axial fluid collection. Moderate ventriculomegaly on the basis of global parenchymal brain volume loss. VASCULAR: Mild to moderate calcific atherosclerosis of  the carotid siphons. SKULL: No skull fracture. No significant scalp soft tissue swelling. SINUSES/ORBITS: Bilateral maxillary mucosal retention cysts. Mastoid air cells are well aerated. The included ocular globes and orbital contents are non-suspicious. OTHER: Patient is edentulous. IMPRESSION: No acute intracranial process. Stable examination including numerous old supra- and infratentorial infarcts predominately within posterior circulation, moderate global brain atrophy. Electronically Signed   By: Awilda Metro M.D.   On: 11/27/2016 03:01   Dg Chest Port 1 View  Result Date: 12/08/2016 CLINICAL DATA:  Chronic CHF, aspiration pneumonia, underlying COPD. EXAM: PORTABLE CHEST 1 VIEW COMPARISON:  Portable chest x-ray of December 07, 2016 FINDINGS: The lungs are slightly less well inflated today. The pulmonary interstitial markings remain increased with areas patchy confluence noted. There is no large pleural effusion. The cardiac silhouette remains enlarged. The pulmonary vascularity remains prominent centrally. There is calcification in the wall of the aortic arch. There is old deformity of the midshaft of the right clavicle. IMPRESSION: Decreased aeration of both lungs today which accentuates the interstitial markings. Persistent bilateral interstitial and alveolar opacities likely reflect pneumonia though mild CHF is suspected as well. Electronically Signed   By: David  [place] M.D.   On: 12/08/2016 07:19   Dg Chest Port 1 View  Result Date: 12/07/2016 CLINICAL DATA:  61 year old male with shortness of breath. Recent fall. Initial encounter. EXAM: PORTABLE CHEST 1 VIEW COMPARISON:  12/06/2016 and earlier. FINDINGS: Portable AP semi upright view at 0515 hours. Stable cardiomegaly and mediastinal contours. Widespread increased pulmonary interstitial markings are chronic to a degree. No superimposed pneumothorax or pulmonary edema. No pleural effusion is evident. Increased patchy retrocardiac opacity  since 11/27/2016. Chronic right clavicle deformity. IMPRESSION: Cardiomegaly and chronic lung disease. Left lung base opacity has increased since 11/27/2016, suspicious for left lung base pneumonia. No pleural effusion identified. Electronically Signed   By: Odessa Fleming M.D.   On: 12/07/2016 07:43   Dg Chest Port 1 View  Result Date: 12/06/2016 CLINICAL DATA:  Shortness of breath . EXAM: PORTABLE CHEST 1 VIEW COMPARISON:  12/05/2016. FINDINGS: Cardiomegaly with progressive bilateral pulmonary infiltrates consistent with congestive heart failure and pulmonary edema. Bilateral pneumonia cannot be excluded . Small right pleural effusion cannot be excluded. No pneumothorax . Healed right clavicular fracture. IMPRESSION: 1. Cardiomegaly with progressive bilateral pulmonary infiltrates/ pulmonary edema. Findings consistent with congestive heart failure. Bilateral pneumonia cannot be excluded. Small right pleural effusion cannot be excluded. 2.  Low lung volumes. Electronically Signed   By: Maisie Fus  Register   On: 12/06/2016 06:50  Dg Chest Port 1 View  Result Date: 12/05/2016 CLINICAL DATA:  Shortness of breath . EXAM: PORTABLE CHEST 1 VIEW COMPARISON:  12/04/2016 . FINDINGS: Cardiomegaly with bilateral from interstitial prominence. No change from prior exam. No pleural effusion or pneumothorax P IMPRESSION: Cardiomegaly with bilateral mild interstitial prominence consistent interstitial edema again noted. No interim change from prior exam. Electronically Signed   By: Maisie Fus  Register   On: 12/05/2016 07:23   Dg Chest Port 1 View  Result Date: 12/04/2016 CLINICAL DATA:  Shortness of Breath EXAM: PORTABLE CHEST 1 VIEW COMPARISON:  12/03/2016 FINDINGS: Cardiomegaly. Mild interstitial prominence and vascular congestion. Question mild interstitial edema. No effusions or acute bony abnormality. IMPRESSION: Vascular congestion with interstitial prominence, question mild interstitial edema. Electronically Signed   By:  Charlett Nose M.D.   On: 12/04/2016 07:11   Dg Chest Port 1 View  Result Date: 12/03/2016 CLINICAL DATA:  Shortness of Breath EXAM: PORTABLE CHEST 1 VIEW COMPARISON:  12/02/2016 FINDINGS: Cardiac shadow is enlarged. Lungs are well aerated bilaterally. Vascular congestion is noted without significant interstitial edema. No sizable effusion is noted. No bony abnormality is seen. IMPRESSION: Stable vascular congestion. Electronically Signed   By: Alcide Clever M.D.   On: 12/03/2016 15:20   Dg Chest Port 1 View  Result Date: 12/02/2016 CLINICAL DATA:  Dyspnea EXAM: PORTABLE CHEST 1 VIEW COMPARISON:  Chest radiograph from one day prior. FINDINGS: Slightly right rotated chest radiograph. Stable cardiomediastinal silhouette with mild cardiomegaly. No pneumothorax. No pleural effusion. Mild pulmonary edema, decreased. IMPRESSION: Mild congestive heart failure, improved. Electronically Signed   By: Delbert Phenix M.D.   On: 12/02/2016 08:06   Dg Chest Port 1 View  Addendum Date: 12/01/2016   ADDENDUM REPORT: 12/01/2016 08:08 ADDENDUM: There is an old healed right clavicle fracture, stable. Electronically Signed   By: Bretta Bang III M.D.   On: 12/01/2016 08:08   Result Date: 12/01/2016 CLINICAL DATA:  Shortness of Breath EXAM: PORTABLE CHEST 1 VIEW COMPARISON:  November 30, 2016 FINDINGS: Extensive airspace consolidation bilaterally, most severe in the left upper to mid lung zones, up remains without significant change. There is cardiomegaly with pulmonary venous hypertension. No adenopathy evident. No bone lesions. IMPRESSION: Widespread airspace consolidation bilaterally, somewhat more on the left than on the right. Evidence a degree of underlying pulmonary vascular congestion. Question multifocal pneumonia versus pulmonary edema; both entities may well exist concurrently. The overall appearance of the lungs and cardiomediastinal silhouette is stable compared to 1 day prior. Electronically Signed: By: Bretta Bang III M.D. On: 12/01/2016 08:02   Dg Chest Port 1 View  Result Date: 11/28/2016 CLINICAL DATA:  Shortness of breath. EXAM: PORTABLE CHEST 1 VIEW COMPARISON:  11/27/2016 and 02/24/2016 FINDINGS: The patient has progressive bilateral pulmonary infiltrates which may represent pulmonary edema or progressive pneumonia. Pulmonary vascularity appears normal in the overall heart size is normal. IMPRESSION: Probable progressive bilateral pneumonia. Electronically Signed   By: Francene Boyers M.D.   On: 11/28/2016 15:31   Dg Chest Port 1 View  Result Date: 11/27/2016 CLINICAL DATA:  sob EXAM: PORTABLE CHEST 1 VIEW COMPARISON:  11/27/2016 FINDINGS: The heart is enlarged. There are airspace filling opacities bilaterally, particularly well seen at the left lung base. Patient is rotated. IMPRESSION: Bilateral airspace filling opacities consistent with pulmonary edema and/or infectious process. Electronically Signed   By: Norva Pavlov M.D.   On: 11/27/2016 22:17   Dg Chest Port 1v Same Day  Result Date: 11/30/2016 CLINICAL DATA:  Shortness  of breath, cough today, pneumonia, history hypertension, prior respiratory failure, coronary disease, chronic systolic CHF, ischemic cardiomyopathy EXAM: PORTABLE CHEST 1 VIEW COMPARISON:  Portable exam 0922 hours compared to 11/28/2016 FINDINGS: Rotated to the LEFT with kyphotic positioning. Enlargement of cardiac silhouette. Stable mediastinal contours for degree of rotation. Extensive BILATERAL pulmonary infiltrates favor pneumonia over edema. No pleural effusion or pneumothorax. Bones unremarkable. IMPRESSION: Persistent BILATERAL pulmonary infiltrates question pneumonia. Enlargement of cardiac silhouette. Electronically Signed   By: Ulyses Southward M.D.   On: 11/30/2016 09:31   Dg Swallowing Func-speech Pathology  Result Date: 11/30/2016 Objective Swallowing Evaluation: Type of Study: MBS-Modified Barium Swallow Study Patient Details Name: YADRIEL KERRIGAN MRN: [id number]  Date of Birth: 03-25-1956 Today's Date: 11/30/2016 Time: SLP Start Time (ACUTE ONLY): 1310-SLP Stop Time (ACUTE ONLY): 1325 SLP Time Calculation (min) (ACUTE ONLY): 15 min Past Medical History: Past Medical History: Diagnosis Date . Acute respiratory failure (HCC)   a. Spring 2017 following fall/splenectomy -->admitted to [facility]-->trach/g tube. . Anemia   a. 12/2015 ABL in setting of fall/hematomas/splenic laceration req splenectomy. Marland Kitchen Apical mural thrombus   a. 11/2015 Echo: EF 25-30%, moderate size apical thrombus-->coumadin;  b. 12/2015 f/u Echo: EF 25-30%, ant/septa HK, no obvious large thrombus but cannot exclude small mural thrombus. Marland Kitchen CAD (coronary artery disease)   a. 12/2014 s/p BMS to the RCA. Marland Kitchen Cerebral infarction (HCC)  . Chronic systolic CHF (congestive heart failure) (HCC)   a. 12/2015 Echo: EF 25-30%. . Dementia  . Depression  . Dysphagia  . Falls   a. 11/2015 traumatic fall with resultant trauma req splenectomy and prolonged hospitalization complicated by resp failure. Marland Kitchen History of pneumonia  . Hyperlipidemia  . Hypertension  . Ischemic cardiomyopathy   a. 12/2015 Echo: EF 25-30%, anterior and septal HK. Marland Kitchen Left hemiplegia (HCC)  . Protein calorie malnutrition (HCC)  . Respiratory failure (HCC)  . Spleen injury  . Status post tracheostomy (HCC)   a. 01/2016 in setting of ongoing resp failure and aspiration. Past Surgical History: Past Surgical History: Procedure Laterality Date . CARDIAC CATHETERIZATION   . CORONARY ANGIOPLASTY WITH STENT PLACEMENT   . IR GENERIC HISTORICAL  07/22/2016  IR GASTROSTOMY TUBE REMOVAL 07/22/2016 [name], MD [facility] . PR LAP, SPLENECTOMY  01/07/2016 . TRACHEOSTOMY TUBE PLACEMENT N/A 02/22/2016  Procedure: TRACHEOSTOMY;  Surgeon: [name], MD;  Location: [facility] OR;  Service: ENT;  Laterality: N/A; HPI: Pt. with PMH of HTN, chronic systolic CHF, chronic respiratory failure on 3L of oxygen, diabetes mellitus, recurrent fall, chronic anticoagulation,  dyslipidemia; admitted on 11/26/2016, with complaint of A mechanical fall, was found to have closed left hip fracture.Patient developed aspiration pneumonia in the hospital. Currently surgery is on hold until pneumonia is better. Per MD pt was on dys 2/nectar thick liquids at SNF, but has been on thin liquids here. Pt had an MBS during admission to [facility] in 2017, but record not available. Pt also had a trach at that time. Reproted has a history of dysphagia due to prior CVAs.  No Data Recorded Assessment / Plan / Recommendation CHL IP CLINICAL IMPRESSIONS 11/30/2016 Therapy Diagnosis Severe pharyngeal phase dysphagia Clinical Impression Pt demonstrates a severe oropharygneal dysphagia with decreased duration and magnitude of hyolaryngeal excursion leading to severe oropharyngeal residuals that are aspirated with and without sensation in further swallow attempts. Base of tongue propulsion of bolus is also poor and epiglottis does not deflect for airway protection. Thin liquids pose a severe risk of aspiration  and nectar thick liquids reduce severity of aspiration events, but are still likely to be aspirated due to increased residuals with thick viscosity. A chin tuck significantly improves bolus transit if fully tucked against chest. Would suspect that carry over for this will be poor, but will trial dys 3 solids and nectar thick liquids with full supervision for a chin tuck to determine if it is achievable.  Impact on safety and function Severe aspiration risk   CHL IP TREATMENT RECOMMENDATION 11/30/2016 Treatment Recommendations Therapy as outlined in treatment plan below   Prognosis 11/30/2016 Prognosis for Safe Diet Advancement Guarded Barriers to Reach Goals Severity of deficits;Time post onset Barriers/Prognosis Comment -- CHL IP DIET RECOMMENDATION 11/30/2016 SLP Diet Recommendations Dysphagia 3 (Mech soft) solids;Nectar thick liquid Liquid Administration via Cup;Straw Medication Administration  Whole meds with puree Compensations Chin tuck;Slow rate;Small sips/bites;Minimize environmental distractions Postural Changes Remain semi-upright after after feeds/meals (Comment);Seated upright at 90 degrees   CHL IP OTHER RECOMMENDATIONS 11/30/2016 Recommended Consults -- Oral Care Recommendations Oral care BID Other Recommendations Order thickener from pharmacy   CHL IP FOLLOW UP RECOMMENDATIONS 11/30/2016 Follow up Recommendations Skilled Nursing facility   [facility] IP FREQUENCY AND DURATION 11/30/2016 Speech Therapy Frequency (ACUTE ONLY) min 2x/week Treatment Duration 2 weeks      CHL IP ORAL PHASE 11/30/2016 Oral Phase WFL Oral - Pudding Teaspoon -- Oral - Pudding Cup -- Oral - Honey Teaspoon -- Oral - Honey Cup -- Oral - Nectar Teaspoon -- Oral - Nectar Cup -- Oral - Nectar Straw -- Oral - Thin Teaspoon -- Oral - Thin Cup -- Oral - Thin Straw -- Oral - Puree -- Oral - Mech Soft -- Oral - Regular -- Oral - Multi-Consistency -- Oral - Pill -- Oral Phase - Comment --  CHL IP PHARYNGEAL PHASE 11/30/2016 Pharyngeal Phase Impaired Pharyngeal- Pudding Teaspoon -- Pharyngeal -- Pharyngeal- Pudding Cup -- Pharyngeal -- Pharyngeal- Honey Teaspoon -- Pharyngeal -- Pharyngeal- Honey Cup -- Pharyngeal -- Pharyngeal- Nectar Teaspoon -- Pharyngeal -- Pharyngeal- Nectar Cup Reduced epiglottic inversion;Reduced anterior laryngeal mobility;Reduced airway/laryngeal closure;Reduced tongue base retraction;Delayed swallow initiation-pyriform sinuses;Pharyngeal residue - valleculae;Pharyngeal residue - pyriform;Compensatory strategies attempted (with notebox) Pharyngeal -- Pharyngeal- Nectar Straw Reduced epiglottic inversion;Reduced anterior laryngeal mobility;Reduced airway/laryngeal closure;Reduced tongue base retraction;Delayed swallow initiation-pyriform sinuses;Pharyngeal residue - valleculae;Pharyngeal residue - pyriform;Compensatory strategies attempted (with notebox);Other (Comment) Pharyngeal -- Pharyngeal- Thin Teaspoon -- Pharyngeal  -- Pharyngeal- Thin Cup Reduced epiglottic inversion;Reduced anterior laryngeal mobility;Reduced airway/laryngeal closure;Reduced tongue base retraction;Delayed swallow initiation-pyriform sinuses;Pharyngeal residue - valleculae;Pharyngeal residue - pyriform;Penetration/Apiration after swallow;Significant aspiration (Amount) Pharyngeal Material enters airway, passes BELOW cords and not ejected out despite cough attempt by patient Pharyngeal- Thin Straw Reduced epiglottic inversion;Reduced anterior laryngeal mobility;Reduced airway/laryngeal closure;Reduced tongue base retraction;Delayed swallow initiation-pyriform sinuses;Pharyngeal residue - valleculae;Pharyngeal residue - pyriform;Compensatory strategies attempted (with notebox);Penetration/Aspiration during swallow Pharyngeal Material enters airway, passes BELOW cords without attempt by patient to eject out (silent aspiration) Pharyngeal- Puree Reduced epiglottic inversion;Reduced anterior laryngeal mobility;Reduced airway/laryngeal closure;Reduced tongue base retraction;Pharyngeal residue - valleculae;Pharyngeal residue - pyriform;Compensatory strategies attempted (with notebox);Delayed swallow initiation-vallecula Pharyngeal -- Pharyngeal- Mechanical Soft Reduced epiglottic inversion;Reduced anterior laryngeal mobility;Reduced airway/laryngeal closure;Reduced tongue base retraction;Pharyngeal residue - valleculae;Pharyngeal residue - pyriform;Compensatory strategies attempted (with notebox);Delayed swallow initiation-vallecula Pharyngeal -- Pharyngeal- Regular -- Pharyngeal -- Pharyngeal- Multi-consistency -- Pharyngeal -- Pharyngeal- Pill Reduced epiglottic inversion;Reduced anterior laryngeal mobility;Reduced airway/laryngeal closure;Reduced tongue base retraction;Pharyngeal residue - valleculae;Pharyngeal residue - pyriform;Compensatory strategies attempted (with notebox);Delayed swallow initiation-vallecula Pharyngeal -- Pharyngeal Comment --  No flowsheet  data found. CHL IP  GO 11/29/2016 Functional Assessment Tool Used clinical judgement Functional Limitations Swallowing Swallow Current Status [phone]) CJ Swallow Goal Status (X9147) CJ Swallow Discharge Status [phone]) (None) Motor Speech Current Status [phone]) (None) Motor Speech Goal Status [phone]) (None) Motor Speech Goal Status (O9629) (None) Spoken Language Comprehension Current Status (B2841) (None) Spoken Language Comprehension Goal Status (L2440) (None) Spoken Language Comprehension Discharge Status [phone]) (None) Spoken Language Expression Current Status [phone]) (None) Spoken Language Expression Goal Status [phone]) (None) Spoken Language Expression Discharge Status [phone]) (None) Attention Current Status (G3875) (None) Attention Goal Status (I4332) (None) Attention Discharge Status [phone]) (None) Memory Current Status (C1660) (None) Memory Goal Status (Y3016) (None) Memory Discharge Status (W1093) (None) Voice Current Status (A3557) (None) Voice Goal Status (D2202) (None) Voice Discharge Status [phone]) (None) Other Speech-Language Pathology Functional Limitation [phone]) (None) Other Speech-Language Pathology Functional Limitation Goal Status (E8315) (None) Other Speech-Language Pathology Functional Limitation Discharge Status [phone]) (None) Harlon Ditty, MA CCC-SLP [phone] [place], Riley Nearing 11/30/2016, 2:50 PM              Dg Femur Min 2 Views Left  Result Date: 11/27/2016 CLINICAL DATA:  Larey Seat while attempting to get out of bed unassisted today. EXAM: LEFT FEMUR 2 VIEWS COMPARISON:  None. FINDINGS: There is a subcapital left hip fracture. Remainder of the femur is intact. No dislocation. No bone lesion or bony destruction. IMPRESSION: Subcapital left hip fracture Electronically Signed   By: Ellery Plunk M.D.   On: 11/27/2016 01:13     LOS: 12 days   Jeoffrey Massed, MD  [facility] Pager:[phone]  If 7PM-7AM, please contact night-coverage [email or web address] Password  [facility] 12/09/2016, 10:27 AM

## 2016-12-09 NOTE — Progress Notes (Signed)
Daily Progress Note   Patient Name: Curtis Keller       Date: 12/09/2016 DOB: Mar 11, 1956  Age: 61 y.o. MRN#: [id number] Attending Physician: Jonetta Osgood, MD Primary Care Physician: Gildardo Cranker, DO Admit Date: 11/26/2016  Reason for Consultation/Follow-up: Establishing goals of care  Subjective: Curtis Keller is awake and alert this morning. He could tell me his name, the year, and that he was at a hospital. He was also able to follow simple commands. Increased O2 requirement overnight, with him requiring 10L via HFNC, however able to titrate back to 5L by midday. He does continue to gargle and cough on his own secretions.  Length of Stay: 12  Current Medications: Scheduled Meds:  . ampicillin-sulbactam (UNASYN) IV  3 g Intravenous Q6H  . aspirin EC  81 mg Oral Daily  . atorvastatin  40 mg Oral QHS  . busPIRone  10 mg Oral BID  . divalproex  125 mg Oral TID  . famotidine  20 mg Oral BID  . FLUoxetine  60 mg Oral Daily  . heparin subcutaneous  5,000 Units Subcutaneous Q8H  . hydrALAZINE  12.5 mg Oral q12n4p  . insulin aspart  0-9 Units Subcutaneous TID WC  . ipratropium-albuterol  3 mL Nebulization TID  . mouth rinse  15 mL Mouth Rinse BID  . metoprolol succinate  25 mg Oral Daily  . polyethylene glycol  17 g Oral Daily  . potassium chloride  20 mEq Oral Daily  . pregabalin  75 mg Oral Daily  . senna-docusate  1 tablet Oral BID    Continuous Infusions: . sodium chloride Stopped (12/08/16 1113)    PRN Meds: acetaminophen, fentaNYL (SUBLIMAZE) injection, glycopyrrolate, haloperidol lactate, methocarbamol, morphine injection, oxyCODONE-acetaminophen, RESOURCE THICKENUP CLEAR  Physical Exam  Constitutional: He appears well-developed. He has a sickly appearance. Nasal  cannula in place.  HENT:  Head: Normocephalic and atraumatic.  Eyes: EOM are normal.  Neck: Normal range of motion.  Cardiovascular: Normal rate.   Occasional PVC  Pulmonary/Chest: Effort normal. No accessory muscle usage. Tachypnea noted. No respiratory distress. He has no wheezes. He has rales.  Wet cough, weak, unable to expectorate.   Abdominal: Soft. Normal appearance and bowel sounds are normal.  Musculoskeletal:  Left sided hemiplegia s/p CVA. Generalized weakness on right.   Neurological: He is alert.  Oriented to person, place, and time. Confused on situation.   Skin: Skin is warm and dry. There is pallor.  Psychiatric: He has a normal mood and affect. Thought content normal. His speech is delayed. He is slowed. Cognition and memory are impaired. He expresses impulsivity.  Nursing note and vitals reviewed.           Vital Signs: BP 139/82 (BP Location: Left Arm)   Pulse 93   Temp 99.5 F (37.5 C) (Oral)   Resp 20   Ht _0  (1.88 m)   Wt 109.6 kg (241 lb 11.2 oz)   SpO2 90%   BMI 31.03 kg/m  SpO2: SpO2: 90 % O2 Device: O2 Device: High Flow Nasal Cannula O2 Flow Rate: O2 Flow Rate (L/min): 10 L/min  Intake/output summary:   Intake/Output Summary (Last 24 hours) at 12/09/16 1005 Last data filed at 12/09/16 0700  Gross per 24 hour  Intake          1052.17 ml  Output             1550 ml  Net          -497.83 ml   LBM: Last BM Date: 12/07/16 Baseline Weight: Weight: 102.1 kg (225 lb) Most recent weight: Weight: 109.6 kg (241 lb 11.2 oz)  Palliative Assessment/Data: PPS 30-40%   Flowsheet Rows   Flowsheet Row Most Recent Value  Intake Tab  Referral Department  Hospitalist  Unit at Time of Referral  ICU  Palliative Care Primary Diagnosis  Cardiac  Date Notified  11/30/16  Palliative Care Type  New Palliative care  Reason for referral  Clarify Goals of Care  Date of Admission  11/26/16  Date first seen by Palliative Care  11/30/16  # of days Palliative  referral response time  0 Day(s)  # of days IP prior to Palliative referral  4  Clinical Assessment  Psychosocial & Spiritual Assessment  Palliative Care Outcomes      Patient Active Problem List   Diagnosis Date Noted  . DNR (do not resuscitate)   . Ischemic cardiomyopathy   . Goals of care, counseling/discussion   . Palliative care encounter   . Acute systolic congestive heart failure ([place])   . Acute and chronic respiratory failure with hypoxia ([place]) 11/28/2016  . Hip fracture ([place]) 11/27/2016  . Closed nondisplaced intertrochanteric fracture of left femur ([place]) 11/27/2016  . Chronic respiratory failure ([place]) 11/27/2016  . Fall at nursing home 07/17/2016  . Diabetes mellitus, type II, insulin dependent ([place]) 06/12/2016  . GERD (gastroesophageal reflux disease) 05/07/2016  . COPD (chronic obstructive pulmonary disease) ([place]) 04/18/2016  . Chronic anticoagulation 03/22/2016  . S/P splenectomy 03/13/2016  . PEG (percutaneous endoscopic gastrostomy) status ([place]) 03/13/2016  . MRSA (methicillin resistant Staphylococcus aureus) septicemia ([place]) 03/13/2016  . Aspiration pneumonia ([place]) 03/13/2016  . Dysphagia 03/13/2016  . Acute blood loss anemia 03/13/2016  . Hyperlipidemia 03/13/2016  . Heart failure, chronic systolic ([place])   . Hypertension   . Depression   . Status post tracheostomy ([place])   . Cardiomyopathy ([place])   . Apical mural thrombus   . Protein calorie malnutrition ([place])   . Intraparenchymal hematoma of brain due to trauma [facility])     Palliative Care Assessment & Plan   HPI: 61 y.o. male  with past medical history ofHTN, systolic CHF EF 40-98%,JXB, DM type II, AAA, COPD, chronic respiratory failure on 2 L of NCoxygen at night, CVA with residual left-sided hemiparesis/dysphagia, apical thrombus on  Coumadin, splenectomy s/p fall with complications resulting in tracheostomy x 6 weeks admitted on 11/26/2016 with left hip fracture s/p fall at SNF. Awaiting hip repair d/t  concern for respiratory status aspiration pneumonia and heart failure exacerbation. High risk for post-op delirium as well as he is already exhibiting signs of delirium. Surgery again postponed 2/12 d/t worsening renal function. WBC also more elevated and making little to no progress or improvement. 12/06/16 worsening with further cardiac, respiratory and renal failure.   Assessment: Curtis Keller was awake and alert on my arrival. He persistently asked for water, despite having a glass of thickened water in his hand. Kidney function continues to worsen (Cr 2.79 today), respiratory status remains variable, and I suspect recurrent aspiration as he is gurgling secretions with intermittent weak cough.   I met with Curtis Keller daughter, [name], and his sister, [name], at his bedside. Dr. Sloan Leiter was present for the first part of the conversation. He detailed the medical course, the management approach, and where things stood now. He was very clear that, despite interventions, Curtis Keller was deteriorating and approaching the end of his life. After he left the room I sat down and attempted to support Curtis Keller in processing this information and providing clarification on next steps. Unfortunately, the conversation went very poorly. [name] was extremely frustrated, angry, and tearful. She fixated on needing his hip repaired, but was unable to hear or accept the other major health issues at play. [name] was very quiet, then very angry when I reiterated that I could hear their frustration. Curtis Keller then decided to leave the room and I continued the conversation (mostly compassionate listening) with Curtis Keller.  Of note, I did speak with Dr. Sloan Leiter after the meeting. He had a chance to talk with Curtis Keller alone. She plans to talk with her sister tonight (her sister has HCPOA but is unable to be at the hospital) and will return tomorrow with a plan for progressive next steps.   Recommendations/Plan:  DNR, continue  current interventions  PMT to plan to meet with [name] tomorrow to discuss plan  Goals of Care and Additional Recommendations:  Limitations on Scope of Treatment: Full Scope Treatment  Code Status:  DNR  Prognosis:   Very poor - worsening every day and likely approaching EOL.   Discharge Planning:  To Be Determined  Discussed with Dr. Sloan Leiter.   Thank you for allowing the Palliative Medicine Team to assist in the care of this patient.  Time in/out: 1240/1400 Total time: 80 minutes    Greater than 50%  of this time was spent counseling and coordinating care related to the above assessment and plan.  Charlynn Court, NP Palliative Medicine Team Pager # [phone] (M-F 8a-5p) Team Phone # [phone] (Nights/Weekends)

## 2016-12-09 NOTE — Progress Notes (Signed)
CSW continuing to follow for discharge planning needs.  Osborne Casco [place] Heap LCSWA [phone]

## 2016-12-09 NOTE — Progress Notes (Signed)
Long d/w patient's daughter-Curtis Keller alone on 4N (after speaking with sister and Curtis Keller together) We went through her father's medical history,ongoing issues with aspiration, resp failure CHF-dysphagia-and slow continued deterioration.Curtis Keller has been through similar issues with her mother recently. She understands very well-that her dad is continuing to deteriorate-inspite of our best efforts. She also understands that her dad is having issues with pain, dysphagia that is causing him to have discomfort and suffering.She wants to talk with her sister-tonight-and will let us know how to proceed over the next day or so. She does want me to give him a dose of Lasix-today, and wants to continue with current measures including DNR status.

## 2016-12-10 ENCOUNTER — Inpatient Hospital Stay (HOSPITAL_COMMUNITY): Payer: Medicare Other

## 2016-12-10 DIAGNOSIS — G9340 Encephalopathy, unspecified: Secondary | ICD-10-CM

## 2016-12-10 DIAGNOSIS — J9601 Acute respiratory failure with hypoxia: Secondary | ICD-10-CM

## 2016-12-10 LAB — GLUCOSE, CAPILLARY
GLUCOSE-CAPILLARY: 158 mg/dL — AB (ref 65–99)
Glucose-Capillary: 159 mg/dL — ABNORMAL HIGH (ref 65–99)
Glucose-Capillary: 159 mg/dL — ABNORMAL HIGH (ref 65–99)
Glucose-Capillary: 234 mg/dL — ABNORMAL HIGH (ref 65–99)

## 2016-12-10 LAB — URINALYSIS, ROUTINE W REFLEX MICROSCOPIC
BILIRUBIN URINE: NEGATIVE
Bacteria, UA: NONE SEEN
GLUCOSE, UA: NEGATIVE mg/dL
Ketones, ur: NEGATIVE mg/dL
LEUKOCYTES UA: NEGATIVE
Nitrite: NEGATIVE
PH: 5 (ref 5.0–8.0)
Protein, ur: 100 mg/dL — AB
SPECIFIC GRAVITY, URINE: 1.02 (ref 1.005–1.030)

## 2016-12-10 LAB — BASIC METABOLIC PANEL
Anion gap: 14 (ref 5–15)
BUN: 82 mg/dL — AB (ref 6–20)
CO2: 28 mmol/L (ref 22–32)
CREATININE: 3.06 mg/dL — AB (ref 0.61–1.24)
Calcium: 8.7 mg/dL — ABNORMAL LOW (ref 8.9–10.3)
Chloride: 109 mmol/L (ref 101–111)
GFR calc Af Amer: 24 mL/min — ABNORMAL LOW (ref >60)
GFR calc non Af Amer: 21 mL/min — ABNORMAL LOW (ref >60)
GLUCOSE: 209 mg/dL — AB (ref 65–99)
POTASSIUM: 3.9 mmol/L (ref 3.5–5.1)
SODIUM: 151 mmol/L — AB (ref 135–145)

## 2016-12-10 LAB — CBC
HEMATOCRIT: 41.9 % (ref 39.0–52.0)
Hemoglobin: 13.4 g/dL (ref 13.0–17.0)
MCH: 32.7 pg (ref 26.0–34.0)
MCHC: 32 g/dL (ref 30.0–36.0)
MCV: 102.2 fL — AB (ref 78.0–100.0)
Platelets: 175 10*3/uL (ref 150–400)
RBC: 4.1 MIL/uL — ABNORMAL LOW (ref 4.22–5.81)
RDW: 16.1 % — AB (ref 11.5–15.5)
WBC: 22.7 10*3/uL — AB (ref 4.0–10.5)

## 2016-12-10 LAB — PROCALCITONIN: Procalcitonin: 1.03 ng/mL

## 2016-12-10 LAB — LACTIC ACID, PLASMA: LACTIC ACID, VENOUS: 2.5 mmol/L — AB (ref 0.5–1.9)

## 2016-12-10 LAB — BRAIN NATRIURETIC PEPTIDE: B Natriuretic Peptide: 250 pg/mL — ABNORMAL HIGH (ref 0.0–100.0)

## 2016-12-10 MED ORDER — SODIUM CHLORIDE 0.9 % IV SOLN
3.0000 g | Freq: Two times a day (BID) | INTRAVENOUS | Status: DC
  Filled 2016-12-10: qty 3

## 2016-12-10 MED ORDER — SODIUM CHLORIDE 0.9 % IV SOLN
1.0000 g | Freq: Two times a day (BID) | INTRAVENOUS | Status: DC
  Administered 2016-12-10 – 2016-12-13 (×6): 1 g via INTRAVENOUS
  Filled 2016-12-10 (×6): qty 1

## 2016-12-10 MED ORDER — IPRATROPIUM-ALBUTEROL 0.5-2.5 (3) MG/3ML IN SOLN
3.0000 mL | Freq: Four times a day (QID) | RESPIRATORY_TRACT | Status: DC
  Administered 2016-12-10 – 2016-12-13 (×11): 3 mL via RESPIRATORY_TRACT
  Filled 2016-12-10 (×11): qty 3

## 2016-12-10 MED ORDER — VANCOMYCIN HCL IN DEXTROSE 750-5 MG/150ML-% IV SOLN
750.0000 mg | Freq: Two times a day (BID) | INTRAVENOUS | Status: DC
  Administered 2016-12-10 – 2016-12-11 (×2): 750 mg via INTRAVENOUS
  Filled 2016-12-10 (×3): qty 150

## 2016-12-10 MED ORDER — OXYCODONE HCL 5 MG PO TABS
5.0000 mg | ORAL_TABLET | Freq: Four times a day (QID) | ORAL | Status: DC | PRN
  Administered 2016-12-11 – 2016-12-13 (×6): 5 mg via ORAL
  Filled 2016-12-10 (×6): qty 1

## 2016-12-10 MED ORDER — LIDOCAINE 5 % EX PTCH
1.0000 | MEDICATED_PATCH | CUTANEOUS | Status: DC
  Administered 2016-12-10 – 2016-12-13 (×3): 1 via TRANSDERMAL
  Filled 2016-12-10 (×6): qty 1

## 2016-12-10 MED ORDER — DM-GUAIFENESIN ER 30-600 MG PO TB12
1.0000 | ORAL_TABLET | Freq: Two times a day (BID) | ORAL | Status: DC
  Administered 2016-12-11 – 2016-12-13 (×5): 1 via ORAL
  Filled 2016-12-10 (×5): qty 1

## 2016-12-10 NOTE — Progress Notes (Signed)
Pharmacy Antibiotic Note  Curtis Keller is a 61 y.o. male admitted on 11/26/2016 with pneumonia.  Pharmacy has been consulted for Vancomycin / Meropenem dosing.  Plan: Vancomycin 750 mg iv Q 12 hours Meropenem 1 gram iv Q 12 hours  Height: 6\' 2"  (188 cm) Weight: 232 lb 1.6 oz (105.3 kg) IBW/kg (Calculated) : 82.2  Temp (24hrs), Avg:99.4 F (37.4 C), Min:97 F (36.1 C), Max:102 F (38.9 C)   Recent Labs Lab 12/06/16 0151 12/07/16 0312 12/08/16 0738 12/09/16 0329 12/10/16 0415  WBC 18.1* 27.6* 29.4* 20.4* 22.7*  CREATININE 2.53* 2.69* 2.71* 2.79* 3.06*    Estimated Creatinine Clearance: 32.8 mL/min (by C-G formula based on SCr of 3.06 mg/dL (H)).    Allergies  Allergen Reactions  . Codeine Anaphylaxis and Nausea And Vomiting  . Hydrocodone Other (See Comments)    On MAR  . Hydrocodone-Acetaminophen Nausea And Vomiting  . Tegaderm Ag Mesh [Silver] Other (See Comments)    On MAR  . Lisinopril Itching and Rash  . Tylenol [Acetaminophen] Nausea Only     Thank you for allowing pharmacy to be a part of this patient's care. Okey Regal, PharmD [phone] 12/10/2016 3:12 PM

## 2016-12-10 NOTE — Progress Notes (Signed)
Patient sleeping and central monitor called to report patient had 9 beats of VT. Strip saved and printed. Patient remains asleep, VSS, will continue to monitor closely.

## 2016-12-10 NOTE — Progress Notes (Signed)
Pt had critical lab value 2.5 @ 5pm. Dr. Awilda Metro notified. No interventions at present.

## 2016-12-10 NOTE — Progress Notes (Addendum)
amion                        PROGRESS NOTE        PATIENT DETAILS Name: Curtis Keller Age: 61 y.o. Sex: male Date of Birth: 08/03/1956 Admit Date: 11/26/2016 Admitting Physician Clydie Braun, MD ZOX:[id number], Maxine Glenn, DO  Brief Narrative: Patient is a 61 y.o. male with history chronic systolic heart failure (EF 25-30%), history of LV thrombus, prior CVA with chronic left-sided hemiplegia-multiple falls over the past few years causing splenic rupture-presented to the hospital on 2/3 following a mechanical fall, found to have a left hip fracture. Unfortunately hospital course has been complicated by development of acute hypoxic respiratory failure, felt due to aspiration PNA vs acute systolic CHF, worsening renal function and hyponatremia. After extensive discussion with patient, family-he is now a DO NOT RESUSCITATE. Palliative care continues to follow and engage with family, however on 2/17, family has decided to revoke the DO NOT RESUSCITATE.  Subjective: On 10 L high flow oxygen-continues to have pain in his left hip. Febrile last night.  Assessment/Plan: Closed Left hip fracture: Secondary to a mechanical fall-unfortunately before hip repair could be attempted-patient developed hypoxic resp failure due to aspiration pneumonia and acute systolic heart failure. Although he initially improved with antibiotics and Lasix-surgery was contemplated on 2/12 but this was postponed due to renal failure. Unfortunately he developed worsening respiratory failure requiring escalating amounts of oxygen-and probably 2/2 recurrring aspiration pneumonitis.Patient seems to be overtly aspirating at times (gurgling at times). His oxygen requirements have slowly increased-he is now on high flow oxygen 10 L/m. Palliative care consulted during this hospital stay, after extensive discussion with family, a DNR was placed. Family today, indicated to palliative care and they would want him to be reverted to a full code.  Family understands poor overall prognosis, but want to continue full aggressive care. I have consulted PCCM today.   Acute hypoxemic respiratory failure secondary to aspiration pneumonia and acute on chronic systolic heart failure: Patient had a aspiration event on 2/5 requiring transfer to stepdown and 100% nonrebreather mask. He was subsequently treated with IV antibiotics and IV Lasix. He initially improved and his oxygen was titrated down to 3 L via nasal cannula. He completed a course of IV Zosyn. He unfortunately developed worsening respiratory failure (suspect he again aspirated), and had to be restarted on intravenous Unasyn on 2/13. At times, he seems to be overtly aspirating his secretions. Due to hypernatremia, he was briefly given D5 W, but that has since discontinued. His weight slowly increased to 241 pounds, on 2/16 he was restarted on IV Lasix 1. He is currently > 9 L neg balance. Very difficult situation, persistent hypernatremia, worsening renal function-and ongoing aspiration pneumonitis/probable decompensated CHF-if family desires ongoing aggressive measures-he may need to be transferred to the intensive care unit for hemodynamic monitoring. Family is aware of poor overall prognosis, as noted above, he was a DNR-however that has been revoked by the family today. Palliative care has been following closely throughout this hospital stay.  Acute renal failure: Suspect hemodynamic mediated acute kidney injury due to ongoing aspiration pneumonitis, congestive heart failure, diuretic use. Creatinine continues to worsen-given hyponatremia he was given a trial of gentle hydration with D5W-however creatinine continued to worsen. Given worsening respiratory status, he was given 1 dose of IV Lasix after speaking with the family on 2/16. Very difficult situation with ongoing respiratory failure-and worsening renal function along with systolic heart failure.  As noted above, since family desires to  continue with aggressive care-he may need to be transferred to the intensive care unit for closer hemodynamic monitoring.  Hypernatremia: probably due to decrease in oral intake and diuretic use-difficult situation-he was briefly hydrated with D5W-unfortunately his hyponatremia persist. Given concern for concurrent CHF, with increasing weight-all IV fluids have been discontinued. He does have some oral intake but clearly not enough to sustain/correct his electrolytes.  Dysphagia: Speech therapy following, underwent modified barium swallow-this is a chronic issue-patient has a prior history of CVA-he previously had a tube in place. Recommendations are to try a dysphagia 3 solids with nectar thick liquids. Spoke with patient's sister over the phone on 2/10, she is aware of the aspiration risk and wishes to continue with Dys 3 diet with nectar thick liquid. Unfortunately, per RN-patient's sister has been occasionally giving patient thin liquids. Unfortunately due to ongoing pain in his left hip, he does require some narcotics- probably potentiates lethargy and aspiration. Very difficult situation. Please see below regarding palliative care issues.  History of left apical thrombus: Previously on Coumadin-currently on hold for surgery. Given high risk for venous thromboembolism post surgery, suspect will require short-term anticoagulation. Long discussion with patient's sister at bedside on 2/7, she would prefer short-term anticoagulation post surgery and is willing to accept all risks. Please see cardiology note regarding this issue as well.  Acute metabolic encephalopathy/dementia: Mental status continues to wax and wane-at times he is completely awake and alert and at times he is confused. When he is awake, and alert-he knows he is not doing well. He has on occasion told his family members to "let him die".  Leukocytosis: Initially thought to be secondary to steroids, but white count is still persistently  elevated. This is now thought to be secondary to aspiration pneumonia-febrile this am-he remains on Unasyn.   Hx of COPD: not in exacerbation-see above.   History of CVA with chronic left sided weakness: Previously on anticoagulation-left-sided deficits are unchanged. Continue statin. See above regarding anticoagulation  GERD: Continue Pepcid  Type 2 diabetes: CBGs stable-continue SSI  History of anxiety/depression/mild dementia: Continue Depakote, fluoxetine and BuSpar.  Frequent falls: Has had frequent falls in the past few years-one episode of fall resulted in a splenic laceration requiring splenectomy. Unfortunately, patient also has a history of LV thrombus and a CVA with left hemiplegia. Although he is a poor long term candidate, he is at significant risk of having venous thromboembolism post-hip surgery. Long discussion with the patient's sister on 2/7(who is a nurse at bedside)-she is agreeable to restart anticoagulation post operatively for the short-term.  Palliative care: Unfortunate 61 year old male with chronic systolic heart failure, prior history of LV thrombus, CVA with left hemiplegia-recurrent falls-had splenectomy a few years back due to splenic rupture-presented with left hip fracture. Unfortunately before hip repair could be performed, patient developed acute hypoxemic respiratory failure due to presumed aspiration pneumonia and acute on chronic systolic heart failure. Further hospital course was complicated by worsening renal function and hypernatremia. Briefly improved with IV Lasix and IV antibiotics-but subsequently hospital course was further complicated by possible repeat aspiration (noted to be gurgling at times), worsening renal function and hypernatremia. Palliative care has been following the patient. We have had multiple discussions with family members over the past few days-on 2/14 family agreed for a DNR-however on 2/17-family requesting that we continue full scope of  treatment including revoking DO NOT RESUSCITATE. I have consulted critical care today. Palliative care continues to follow.  Family aware of poor prognoses.   DVT Prophylaxis: Prophylactic Heparin   Code Status: Full code  Family Communication: Daughter/Sister at bedside  Disposition Plan: Remain inpatient-remain in SDU-await PCCM opinion  Antimicrobial agents: Anti-infectives    Start     Dose/Rate Route Frequency Ordered Stop   12/10/16 1800  Ampicillin-Sulbactam (UNASYN) 3 g in sodium chloride 0.9 % 100 mL IVPB     3 g 200 mL/hr over 30 Minutes Intravenous Every 12 hours 12/10/16 1127     12/06/16 1230  Ampicillin-Sulbactam (UNASYN) 3 g in sodium chloride 0.9 % 100 mL IVPB  Status:  Discontinued     3 g 200 mL/hr over 30 Minutes Intravenous Every 6 hours 12/06/16 1139 12/10/16 1127   11/28/16 1530  piperacillin-tazobactam (ZOSYN) IVPB 3.375 g     3.375 g 12.5 mL/hr over 240 Minutes Intravenous Every 8 hours 11/28/16 1518 12/04/16 2359   11/28/16 1530  vancomycin (VANCOCIN) IVPB 750 mg/150 ml premix  Status:  Discontinued     750 mg 150 mL/hr over 60 Minutes Intravenous Every 12 hours 11/28/16 1518 12/01/16 1031   11/28/16 1520  ceFAZolin (ANCEF) IVPB 2g/100 mL premix  Status:  Discontinued     2 g 200 mL/hr over 30 Minutes Intravenous To ShortStay [facility] 11/28/16 1247 11/28/16 2334   11/28/16 1000  piperacillin-tazobactam (ZOSYN) IVPB 3.375 g  Status:  Discontinued     3.375 g 12.5 mL/hr over 240 Minutes Intravenous Every 8 hours 11/28/16 0832 11/28/16 1518   11/28/16 1000  vancomycin (VANCOCIN) IVPB 750 mg/150 ml premix  Status:  Discontinued     750 mg 150 mL/hr over 60 Minutes Intravenous Every 12 hours 11/28/16 0865 11/28/16 1518      Procedures: Echo 2/5 - Very limited image quality, however LVEF is severely impaired,   estimated at 25-30% with akinesis of all of the apical segments   and mid anteroseptal, anterior and anterolateral segments.   There is no  thrombus on the contrast images.   A cardiac MRI is recommended for further LVEF evaluation.   CONSULTS:  cardiology and orthopedic surgery  Palliative care  Time spent: 32- minutes-Greater than 50% of this time was spent in counseling, explanation of diagnosis, planning of further management, and coordination of care.  MEDICATIONS: Scheduled Meds: . ampicillin-sulbactam (UNASYN) IV  3 g Intravenous Q12H  . aspirin EC  81 mg Oral Daily  . atorvastatin  40 mg Oral QHS  . busPIRone  10 mg Oral BID  . divalproex  125 mg Oral TID  . famotidine  20 mg Oral BID  . FLUoxetine  60 mg Oral Daily  . hydrALAZINE  12.5 mg Oral q12n4p  . insulin aspart  0-9 Units Subcutaneous TID WC  . ipratropium-albuterol  3 mL Nebulization TID  . lidocaine  1 patch Transdermal Q24H  . mouth rinse  15 mL Mouth Rinse BID  . metoprolol succinate  25 mg Oral Daily  . polyethylene glycol  17 g Oral Daily  . potassium chloride  20 mEq Oral Daily  . pregabalin  75 mg Oral Daily  . senna-docusate  1 tablet Oral BID   Continuous Infusions: . sodium chloride 10 mL/hr at 12/10/16 0700   PRN Meds:.acetaminophen, fentaNYL (SUBLIMAZE) injection, glycopyrrolate, haloperidol lactate, methocarbamol, morphine injection, oxyCODONE, RESOURCE THICKENUP CLEAR   PHYSICAL EXAM: Vital signs: Vitals:   12/10/16 0400 12/10/16 0500 12/10/16 0758 12/10/16 0826  BP: 106/71  124/89   Pulse: 69  (!) 43   Resp: 18  15   Temp:   97.9 F (36.6 C)   TempSrc:   Oral   SpO2: 96%  94% 94%  Weight:  105.3 kg (232 lb 1.6 oz)    Height:       Filed Weights   12/08/16 0500 12/09/16 0500 12/10/16 0500  Weight: 107.5 kg (237 lb) 109.6 kg (241 lb 11.2 oz) 105.3 kg (232 lb 1.6 oz)   Body mass index is 29.8 kg/m.   General appearance :Awake, alert, Looks very frail and slightly lethargic. Eyes:, pupils equally reactive to light and accomodation HEENT: Atraumatic and Normocephalic Neck: supple, no JVD. No cervical  lymphadenopathy. Resp:Good air entry bilaterally,Continues to have transmitted upper airway sounds-he has rales up to his mid chest anteriorly.  CVS: S1 S2 regular GI: Bowel sounds present, Non tender and not distended with no gaurding, rigidity or rebound.No organomegaly Extremities: B/L Lower Ext shows no edema, both legs are warm to touch Neurology: Left hemiplegia Musculoskeletal:No digital cyanosis Skin:No Rash, warm and dry Wounds:N/A  I have personally reviewed following labs and imaging studies  LABORATORY DATA: CBC:  Recent Labs Lab 12/06/16 0151 12/07/16 0312 12/08/16 0738 12/09/16 0329 12/10/16 0415  WBC 18.1* 27.6* 29.4* 20.4* 22.7*  HGB 15.8 16.1 14.4 14.0 13.4  HCT 48.3 49.3 44.6 43.1 41.9  MCV 101.7* 101.6* 102.8* 102.6* 102.2*  PLT 245 227 197 199 175    Basic Metabolic Panel:  Recent Labs Lab 12/06/16 0151 12/07/16 0312 12/08/16 0738 12/09/16 0329 12/10/16 0415  NA 153* 153* 155* 154* 151*  K 4.5 4.6 4.0 4.3 3.9  CL 109 110 113* 112* 109  CO2 31 31 32 30 28  GLUCOSE 146* 207* 172* 135* 209*  BUN 88* 86* 85* 83* 82*  CREATININE 2.53* 2.69* 2.71* 2.79* 3.06*  CALCIUM 9.2 9.1 9.0 8.8* 8.7*    GFR: Estimated Creatinine Clearance: 32.8 mL/min (by C-G formula based on SCr of 3.06 mg/dL (H)).  Liver Function Tests:  Recent Labs Lab 12/08/16 0738  AST 21  ALT 17  ALKPHOS 84  BILITOT 0.6  PROT 6.3*  ALBUMIN 1.9*   No results for input(s): LIPASE, AMYLASE in the last 168 hours. No results for input(s): AMMONIA in the last 168 hours.  Coagulation Profile:  Recent Labs Lab 12/06/16 0151  INR 1.18    Cardiac Enzymes: No results for input(s): CKTOTAL, CKMB, CKMBINDEX, TROPONINI in the last 168 hours.  BNP (last 3 results) No results for input(s): PROBNP in the last 8760 hours.  HbA1C: No results for input(s): HGBA1C in the last 72 hours.  CBG:  Recent Labs Lab 12/09/16 0815 12/09/16 1200 12/09/16 1600 12/09/16 2007  12/10/16 0755  GLUCAP 143* 196* 157* 129* 159*    Lipid Profile: No results for input(s): CHOL, HDL, LDLCALC, TRIG, CHOLHDL, LDLDIRECT in the last 72 hours.  Thyroid Function Tests: No results for input(s): TSH, T4TOTAL, FREET4, T3FREE, THYROIDAB in the last 72 hours.  Anemia Panel: No results for input(s): VITAMINB12, FOLATE, FERRITIN, TIBC, IRON, RETICCTPCT in the last 72 hours.  Urine analysis:    Component Value Date/Time   COLORURINE YELLOW 11/27/2016 0355   APPEARANCEUR CLEAR 11/27/2016 0355   LABSPEC 1.015 11/27/2016 0355   PHURINE 6.0 11/27/2016 0355   GLUCOSEU NEGATIVE 11/27/2016 0355   HGBUR LARGE (A) 11/27/2016 0355   BILIRUBINUR NEGATIVE 11/27/2016 0355   KETONESUR NEGATIVE 11/27/2016 0355   PROTEINUR 100 (A) 11/27/2016 0355   NITRITE NEGATIVE 11/27/2016 0355   LEUKOCYTESUR NEGATIVE 11/27/2016 0355  Sepsis Labs: Lactic Acid, Venous    Component Value Date/Time   LATICACIDVEN 1.9 11/28/2016 2023    MICROBIOLOGY: No results found for this or any previous visit (from the past 240 hour(s)).  RADIOLOGY STUDIES/RESULTS: Dg Chest 1 View  Result Date: 11/27/2016 CLINICAL DATA:  Larey Seat today.  Left hip fracture. EXAM: CHEST 1 VIEW COMPARISON:  02/24/2016 FINDINGS: Diffuse interstitial coarsening is probably chronic. No consolidation. No effusion. No pneumothorax. New unchanged hilar, mediastinal and cardiac contours. IMPRESSION: Chronic appearing interstitial coarsening. No consolidation or effusion. Electronically Signed   By: Ellery Plunk M.D.   On: 11/27/2016 01:14   Dg Pelvis 1-2 Views  Result Date: 11/27/2016 CLINICAL DATA:  Larey Seat today while trying to get out of bed unassisted. EXAM: PELVIS - 1-2 VIEW COMPARISON:  None. FINDINGS: There is an acute subcapital left hip fracture. No dislocation. No bone lesion or bony destruction. Pelvis appears intact. IMPRESSION: Subcapital left hip fracture Electronically Signed   By: Ellery Plunk M.D.   On: 11/27/2016  01:13   Ct Head Wo Contrast  Result Date: 11/27/2016 CLINICAL DATA:  Larey Seat getting out of bed today, acute LEFT hip fracture. History of hypertension, stroke. EXAM: CT HEAD WITHOUT CONTRAST TECHNIQUE: Contiguous axial images were obtained from the base of the skull through the vertex without intravenous contrast. COMPARISON:  CT HEAD January 26, 2016 FINDINGS: BRAIN: No intraparenchymal hemorrhage, mass effect, midline shift or acute large vascular territory infarcts. Old RIGHT greater than LEFT cerebellar infarcts, severe brachium contrast atrophy. Old RIGHT greater than LEFT pontine infarcts. Old LEFT thalamus and bilateral basal ganglia lacunar infarcts. Old small RIGHT occipital lobe infarct. LEFT inferior temporal lobe encephalomalacia is likely posttraumatic. Mild to moderate white matter changes compatible with chronic small vessel ischemic disease. No abnormal extra-axial fluid collection. Moderate ventriculomegaly on the basis of global parenchymal brain volume loss. VASCULAR: Mild to moderate calcific atherosclerosis of the carotid siphons. SKULL: No skull fracture. No significant scalp soft tissue swelling. SINUSES/ORBITS: Bilateral maxillary mucosal retention cysts. Mastoid air cells are well aerated. The included ocular globes and orbital contents are non-suspicious. OTHER: Patient is edentulous. IMPRESSION: No acute intracranial process. Stable examination including numerous old supra- and infratentorial infarcts predominately within posterior circulation, moderate global brain atrophy. Electronically Signed   By: Awilda Metro M.D.   On: 11/27/2016 03:01   Dg Chest Port 1 View  Result Date: 12/10/2016 CLINICAL DATA:  Fever and shortness of breath EXAM: PORTABLE CHEST 1 VIEW COMPARISON:  December 08, 2016 FINDINGS: There remains interstitial and patchy alveolar edema. There is focal airspace consolidation in the left lower lobe. There is cardiomegaly with mild pulmonary venous hypertension.  There is a a rather minimal right pleural effusion. No adenopathy. No pneumothorax. There is an old healed fracture of the right clavicle. IMPRESSION: Findings felt to be most indicative of a degree of congestive heart failure. Superimposed consolidation in the left base may represent pneumonia or aspiration pneumonitis. Pneumonitis and congestive heart failure may present concurrently. Cardiomegaly is stable. Old healed fracture right clavicle. No evident pneumothorax. Electronically Signed   By: Bretta Bang III M.D.   On: 12/10/2016 07:30   Dg Chest Port 1 View  Result Date: 12/08/2016 CLINICAL DATA:  Chronic CHF, aspiration pneumonia, underlying COPD. EXAM: PORTABLE CHEST 1 VIEW COMPARISON:  Portable chest x-ray of December 07, 2016 FINDINGS: The lungs are slightly less well inflated today. The pulmonary interstitial markings remain increased with areas patchy confluence noted. There is no large pleural effusion. The cardiac  silhouette remains enlarged. The pulmonary vascularity remains prominent centrally. There is calcification in the wall of the aortic arch. There is old deformity of the midshaft of the right clavicle. IMPRESSION: Decreased aeration of both lungs today which accentuates the interstitial markings. Persistent bilateral interstitial and alveolar opacities likely reflect pneumonia though mild CHF is suspected as well. Electronically Signed   By: David  [place] M.D.   On: 12/08/2016 07:19   Dg Chest Port 1 View  Result Date: 12/07/2016 CLINICAL DATA:  61 year old male with shortness of breath. Recent fall. Initial encounter. EXAM: PORTABLE CHEST 1 VIEW COMPARISON:  12/06/2016 and earlier. FINDINGS: Portable AP semi upright view at 0515 hours. Stable cardiomegaly and mediastinal contours. Widespread increased pulmonary interstitial markings are chronic to a degree. No superimposed pneumothorax or pulmonary edema. No pleural effusion is evident. Increased patchy retrocardiac opacity  since 11/27/2016. Chronic right clavicle deformity. IMPRESSION: Cardiomegaly and chronic lung disease. Left lung base opacity has increased since 11/27/2016, suspicious for left lung base pneumonia. No pleural effusion identified. Electronically Signed   By: Odessa Fleming M.D.   On: 12/07/2016 07:43   Dg Chest Port 1 View  Result Date: 12/06/2016 CLINICAL DATA:  Shortness of breath . EXAM: PORTABLE CHEST 1 VIEW COMPARISON:  12/05/2016. FINDINGS: Cardiomegaly with progressive bilateral pulmonary infiltrates consistent with congestive heart failure and pulmonary edema. Bilateral pneumonia cannot be excluded . Small right pleural effusion cannot be excluded. No pneumothorax . Healed right clavicular fracture. IMPRESSION: 1. Cardiomegaly with progressive bilateral pulmonary infiltrates/ pulmonary edema. Findings consistent with congestive heart failure. Bilateral pneumonia cannot be excluded. Small right pleural effusion cannot be excluded. 2.  Low lung volumes. Electronically Signed   By: Maisie Fus  Register   On: 12/06/2016 06:50   Dg Chest Port 1 View  Result Date: 12/05/2016 CLINICAL DATA:  Shortness of breath . EXAM: PORTABLE CHEST 1 VIEW COMPARISON:  12/04/2016 . FINDINGS: Cardiomegaly with bilateral from interstitial prominence. No change from prior exam. No pleural effusion or pneumothorax P IMPRESSION: Cardiomegaly with bilateral mild interstitial prominence consistent interstitial edema again noted. No interim change from prior exam. Electronically Signed   By: Maisie Fus  Register   On: 12/05/2016 07:23   Dg Chest Port 1 View  Result Date: 12/04/2016 CLINICAL DATA:  Shortness of Breath EXAM: PORTABLE CHEST 1 VIEW COMPARISON:  12/03/2016 FINDINGS: Cardiomegaly. Mild interstitial prominence and vascular congestion. Question mild interstitial edema. No effusions or acute bony abnormality. IMPRESSION: Vascular congestion with interstitial prominence, question mild interstitial edema. Electronically Signed   By:  Charlett Nose M.D.   On: 12/04/2016 07:11   Dg Chest Port 1 View  Result Date: 12/03/2016 CLINICAL DATA:  Shortness of Breath EXAM: PORTABLE CHEST 1 VIEW COMPARISON:  12/02/2016 FINDINGS: Cardiac shadow is enlarged. Lungs are well aerated bilaterally. Vascular congestion is noted without significant interstitial edema. No sizable effusion is noted. No bony abnormality is seen. IMPRESSION: Stable vascular congestion. Electronically Signed   By: Alcide Clever M.D.   On: 12/03/2016 15:20   Dg Chest Port 1 View  Result Date: 12/02/2016 CLINICAL DATA:  Dyspnea EXAM: PORTABLE CHEST 1 VIEW COMPARISON:  Chest radiograph from one day prior. FINDINGS: Slightly right rotated chest radiograph. Stable cardiomediastinal silhouette with mild cardiomegaly. No pneumothorax. No pleural effusion. Mild pulmonary edema, decreased. IMPRESSION: Mild congestive heart failure, improved. Electronically Signed   By: Delbert Phenix M.D.   On: 12/02/2016 08:06   Dg Chest Port 1 View  Addendum Date: 12/01/2016   ADDENDUM REPORT: 12/01/2016 08:08 ADDENDUM: There  is an old healed right clavicle fracture, stable. Electronically Signed   By: Bretta Bang III M.D.   On: 12/01/2016 08:08   Result Date: 12/01/2016 CLINICAL DATA:  Shortness of Breath EXAM: PORTABLE CHEST 1 VIEW COMPARISON:  November 30, 2016 FINDINGS: Extensive airspace consolidation bilaterally, most severe in the left upper to mid lung zones, up remains without significant change. There is cardiomegaly with pulmonary venous hypertension. No adenopathy evident. No bone lesions. IMPRESSION: Widespread airspace consolidation bilaterally, somewhat more on the left than on the right. Evidence a degree of underlying pulmonary vascular congestion. Question multifocal pneumonia versus pulmonary edema; both entities may well exist concurrently. The overall appearance of the lungs and cardiomediastinal silhouette is stable compared to 1 day prior. Electronically Signed: By: Bretta Bang III M.D. On: 12/01/2016 08:02   Dg Chest Port 1 View  Result Date: 11/28/2016 CLINICAL DATA:  Shortness of breath. EXAM: PORTABLE CHEST 1 VIEW COMPARISON:  11/27/2016 and 02/24/2016 FINDINGS: The patient has progressive bilateral pulmonary infiltrates which may represent pulmonary edema or progressive pneumonia. Pulmonary vascularity appears normal in the overall heart size is normal. IMPRESSION: Probable progressive bilateral pneumonia. Electronically Signed   By: Francene Boyers M.D.   On: 11/28/2016 15:31   Dg Chest Port 1 View  Result Date: 11/27/2016 CLINICAL DATA:  sob EXAM: PORTABLE CHEST 1 VIEW COMPARISON:  11/27/2016 FINDINGS: The heart is enlarged. There are airspace filling opacities bilaterally, particularly well seen at the left lung base. Patient is rotated. IMPRESSION: Bilateral airspace filling opacities consistent with pulmonary edema and/or infectious process. Electronically Signed   By: Norva Pavlov M.D.   On: 11/27/2016 22:17   Dg Chest Port 1v Same Day  Result Date: 11/30/2016 CLINICAL DATA:  Shortness of breath, cough today, pneumonia, history hypertension, prior respiratory failure, coronary disease, chronic systolic CHF, ischemic cardiomyopathy EXAM: PORTABLE CHEST 1 VIEW COMPARISON:  Portable exam 0922 hours compared to 11/28/2016 FINDINGS: Rotated to the LEFT with kyphotic positioning. Enlargement of cardiac silhouette. Stable mediastinal contours for degree of rotation. Extensive BILATERAL pulmonary infiltrates favor pneumonia over edema. No pleural effusion or pneumothorax. Bones unremarkable. IMPRESSION: Persistent BILATERAL pulmonary infiltrates question pneumonia. Enlargement of cardiac silhouette. Electronically Signed   By: Ulyses Southward M.D.   On: 11/30/2016 09:31   Dg Swallowing Func-speech Pathology  Result Date: 11/30/2016 Objective Swallowing Evaluation: Type of Study: MBS-Modified Barium Swallow Study Patient Details Name: TAKUMA CIFELLI MRN: [id number]  Date of Birth: December 30, 1955 Today's Date: 11/30/2016 Time: SLP Start Time (ACUTE ONLY): 1310-SLP Stop Time (ACUTE ONLY): 1325 SLP Time Calculation (min) (ACUTE ONLY): 15 min Past Medical History: Past Medical History: Diagnosis Date . Acute respiratory failure (HCC)   a. Spring 2017 following fall/splenectomy -->admitted to [facility]-->trach/g tube. . Anemia   a. 12/2015 ABL in setting of fall/hematomas/splenic laceration req splenectomy. Marland Kitchen Apical mural thrombus   a. 11/2015 Echo: EF 25-30%, moderate size apical thrombus-->coumadin;  b. 12/2015 f/u Echo: EF 25-30%, ant/septa HK, no obvious large thrombus but cannot exclude small mural thrombus. Marland Kitchen CAD (coronary artery disease)   a. 12/2014 s/p BMS to the RCA. Marland Kitchen Cerebral infarction (HCC)  . Chronic systolic CHF (congestive heart failure) (HCC)   a. 12/2015 Echo: EF 25-30%. . Dementia  . Depression  . Dysphagia  . Falls   a. 11/2015 traumatic fall with resultant trauma req splenectomy and prolonged hospitalization complicated by resp failure. Marland Kitchen History of pneumonia  . Hyperlipidemia  . Hypertension  . Ischemic cardiomyopathy   a. 12/2015  Echo: EF 25-30%, anterior and septal HK. Marland Kitchen Left hemiplegia (HCC)  . Protein calorie malnutrition (HCC)  . Respiratory failure (HCC)  . Spleen injury  . Status post tracheostomy (HCC)   a. 01/2016 in setting of ongoing resp failure and aspiration. Past Surgical History: Past Surgical History: Procedure Laterality Date . CARDIAC CATHETERIZATION   . CORONARY ANGIOPLASTY WITH STENT PLACEMENT   . IR GENERIC HISTORICAL  07/22/2016  IR GASTROSTOMY TUBE REMOVAL 07/22/2016 [name], MD [facility]-INTER[facility] . PR LAP, SPLENECTOMY  01/07/2016 . TRACHEOSTOMY TUBE PLACEMENT N/A 02/22/2016  Procedure: TRACHEOSTOMY;  Surgeon: [name], MD;  Location: [facility] OR;  Service: ENT;  Laterality: N/A; HPI: Pt. with PMH of HTN, chronic systolic CHF, chronic respiratory failure on 3L of oxygen, diabetes mellitus, recurrent fall, chronic anticoagulation,  dyslipidemia; admitted on 11/26/2016, with complaint of A mechanical fall, was found to have closed left hip fracture.Patient developed aspiration pneumonia in the hospital. Currently surgery is on hold until pneumonia is better. Per MD pt was on dys 2/nectar thick liquids at SNF, but has been on thin liquids here. Pt had an MBS during admission to [facility] in 2017, but record not available. Pt also had a trach at that time. Reproted has a history of dysphagia due to prior CVAs.  No Data Recorded Assessment / Plan / Recommendation CHL IP CLINICAL IMPRESSIONS 11/30/2016 Therapy Diagnosis Severe pharyngeal phase dysphagia Clinical Impression Pt demonstrates a severe oropharygneal dysphagia with decreased duration and magnitude of hyolaryngeal excursion leading to severe oropharyngeal residuals that are aspirated with and without sensation in further swallow attempts. Base of tongue propulsion of bolus is also poor and epiglottis does not deflect for airway protection. Thin liquids pose a severe risk of aspiration and nectar thick liquids reduce severity of aspiration events, but are still likely to be aspirated due to increased residuals with thick viscosity. A chin tuck significantly improves bolus transit if fully tucked against chest. Would suspect that carry over for this will be poor, but will trial dys 3 solids and nectar thick liquids with full supervision for a chin tuck to determine if it is achievable.  Impact on safety and function Severe aspiration risk   CHL IP TREATMENT RECOMMENDATION 11/30/2016 Treatment Recommendations Therapy as outlined in treatment plan below   Prognosis 11/30/2016 Prognosis for Safe Diet Advancement Guarded Barriers to Reach Goals Severity of deficits;Time post onset Barriers/Prognosis Comment -- CHL IP DIET RECOMMENDATION 11/30/2016 SLP Diet Recommendations Dysphagia 3 (Mech soft) solids;Nectar thick liquid Liquid Administration via Cup;Straw Medication Administration  Whole meds with puree Compensations Chin tuck;Slow rate;Small sips/bites;Minimize environmental distractions Postural Changes Remain semi-upright after after feeds/meals (Comment);Seated upright at 90 degrees   CHL IP OTHER RECOMMENDATIONS 11/30/2016 Recommended Consults -- Oral Care Recommendations Oral care BID Other Recommendations Order thickener from pharmacy   CHL IP FOLLOW UP RECOMMENDATIONS 11/30/2016 Follow up Recommendations Skilled Nursing facility   [facility] IP FREQUENCY AND DURATION 11/30/2016 Speech Therapy Frequency (ACUTE ONLY) min 2x/week Treatment Duration 2 weeks      CHL IP ORAL PHASE 11/30/2016 Oral Phase WFL Oral - Pudding Teaspoon -- Oral - Pudding Cup -- Oral - Honey Teaspoon -- Oral - Honey Cup -- Oral - Nectar Teaspoon -- Oral - Nectar Cup -- Oral - Nectar Straw -- Oral - Thin Teaspoon -- Oral - Thin Cup -- Oral - Thin Straw -- Oral - Puree -- Oral - Mech Soft -- Oral - Regular -- Oral - Multi-Consistency -- Oral - Pill -- Oral Phase -  Comment --  CHL IP PHARYNGEAL PHASE 11/30/2016 Pharyngeal Phase Impaired Pharyngeal- Pudding Teaspoon -- Pharyngeal -- Pharyngeal- Pudding Cup -- Pharyngeal -- Pharyngeal- Honey Teaspoon -- Pharyngeal -- Pharyngeal- Honey Cup -- Pharyngeal -- Pharyngeal- Nectar Teaspoon -- Pharyngeal -- Pharyngeal- Nectar Cup Reduced epiglottic inversion;Reduced anterior laryngeal mobility;Reduced airway/laryngeal closure;Reduced tongue base retraction;Delayed swallow initiation-pyriform sinuses;Pharyngeal residue - valleculae;Pharyngeal residue - pyriform;Compensatory strategies attempted (with notebox) Pharyngeal -- Pharyngeal- Nectar Straw Reduced epiglottic inversion;Reduced anterior laryngeal mobility;Reduced airway/laryngeal closure;Reduced tongue base retraction;Delayed swallow initiation-pyriform sinuses;Pharyngeal residue - valleculae;Pharyngeal residue - pyriform;Compensatory strategies attempted (with notebox);Other (Comment) Pharyngeal -- Pharyngeal- Thin Teaspoon -- Pharyngeal  -- Pharyngeal- Thin Cup Reduced epiglottic inversion;Reduced anterior laryngeal mobility;Reduced airway/laryngeal closure;Reduced tongue base retraction;Delayed swallow initiation-pyriform sinuses;Pharyngeal residue - valleculae;Pharyngeal residue - pyriform;Penetration/Apiration after swallow;Significant aspiration (Amount) Pharyngeal Material enters airway, passes BELOW cords and not ejected out despite cough attempt by patient Pharyngeal- Thin Straw Reduced epiglottic inversion;Reduced anterior laryngeal mobility;Reduced airway/laryngeal closure;Reduced tongue base retraction;Delayed swallow initiation-pyriform sinuses;Pharyngeal residue - valleculae;Pharyngeal residue - pyriform;Compensatory strategies attempted (with notebox);Penetration/Aspiration during swallow Pharyngeal Material enters airway, passes BELOW cords without attempt by patient to eject out (silent aspiration) Pharyngeal- Puree Reduced epiglottic inversion;Reduced anterior laryngeal mobility;Reduced airway/laryngeal closure;Reduced tongue base retraction;Pharyngeal residue - valleculae;Pharyngeal residue - pyriform;Compensatory strategies attempted (with notebox);Delayed swallow initiation-vallecula Pharyngeal -- Pharyngeal- Mechanical Soft Reduced epiglottic inversion;Reduced anterior laryngeal mobility;Reduced airway/laryngeal closure;Reduced tongue base retraction;Pharyngeal residue - valleculae;Pharyngeal residue - pyriform;Compensatory strategies attempted (with notebox);Delayed swallow initiation-vallecula Pharyngeal -- Pharyngeal- Regular -- Pharyngeal -- Pharyngeal- Multi-consistency -- Pharyngeal -- Pharyngeal- Pill Reduced epiglottic inversion;Reduced anterior laryngeal mobility;Reduced airway/laryngeal closure;Reduced tongue base retraction;Pharyngeal residue - valleculae;Pharyngeal residue - pyriform;Compensatory strategies attempted (with notebox);Delayed swallow initiation-vallecula Pharyngeal -- Pharyngeal Comment --  No flowsheet  data found. CHL IP GO 11/29/2016 Functional Assessment Tool Used clinical judgement Functional Limitations Swallowing Swallow Current Status [phone]) CJ Swallow Goal Status (U0454) CJ Swallow Discharge Status (U9811) (None) Motor Speech Current Status [phone]) (None) Motor Speech Goal Status (G9562) (None) Motor Speech Goal Status (Z3086) (None) Spoken Language Comprehension Current Status (V7846) (None) Spoken Language Comprehension Goal Status (N6295) (None) Spoken Language Comprehension Discharge Status [phone]) (None) Spoken Language Expression Current Status [phone]) (None) Spoken Language Expression Goal Status [phone]) (None) Spoken Language Expression Discharge Status [phone]) (None) Attention Current Status (I3474) (None) Attention Goal Status (Q5956) (None) Attention Discharge Status [phone]) (None) Memory Current Status (E3329) (None) Memory Goal Status (J1884) (None) Memory Discharge Status (Z6606) (None) Voice Current Status (T0160) (None) Voice Goal Status (F0932) (None) Voice Discharge Status (T5573) (None) Other Speech-Language Pathology Functional Limitation [phone] (None) Other Speech-Language Pathology Functional Limitation Goal Status (K2706) (None) Other Speech-Language Pathology Functional Limitation Discharge Status [phone]) (None) Harlon Ditty, MA CCC-SLP [phone] [place], Riley Nearing 11/30/2016, 2:50 PM              Dg Femur Min 2 Views Left  Result Date: 11/27/2016 CLINICAL DATA:  Larey Seat while attempting to get out of bed unassisted today. EXAM: LEFT FEMUR 2 VIEWS COMPARISON:  None. FINDINGS: There is a subcapital left hip fracture. Remainder of the femur is intact. No dislocation. No bone lesion or bony destruction. IMPRESSION: Subcapital left hip fracture Electronically Signed   By: Ellery Plunk M.D.   On: 11/27/2016 01:13     LOS: 13 days   Jeoffrey Massed, MD  [facility] Pager:[phone]  If 7PM-7AM, please contact night-coverage [email or web address] Password  [facility] 12/10/2016, 12:09 PM

## 2016-12-10 NOTE — Progress Notes (Signed)
CRITICAL VALUE ALERT  Critical value received: 2.5 Lactic acid Date of notification:  12/10/2016   Time of notification:16.45 Critical value read back:yes  Nurse who received alert: Landry Dyke RN MD notified (1st page):  Dr. Jerral Ralph  Time of first page:  16.50  MD notified (2nd page):  Time of second page:  Responding MD:  Dr. Jerral Ralph Time MD responded:  16.52

## 2016-12-10 NOTE — Consult Note (Signed)
Name: Curtis Keller MRN: [id number] DOB: 14-Dec-1955    ADMISSION DATE:  11/26/2016 CONSULTATION DATE:  12/10/16  REFERRING MD :  [facility] /Dr. Sloan Leiter   CHIEF COMPLAINT:  Resp Failure/PNA  BRIEF PATIENT DESCRIPTION:  61 yo male with hx of Systolic CHF (EF 34-28%), hx of LV thrombus/prior CVA w/ left sided hemiplegia -frequent falls admitted 11/26/16 for left hip fracture after fall. Prior to surgery developed possible aspiration PNA +/- acute CHF, a/c renal failure. He has been treated aggressively with IV abx and diuresis with minimal improvement. Unable to have surgery due to medical instability. Pt DNR status was changed by family member. Palliative care has been following .   SIGNIFICANT EVENTS  2/4 admitted left hip fracture  STUDIES:  2/4 XR >left hip fracture  2/4 CT head chronic changes    HISTORY OF PRESENT ILLNESS:   61 yo male with hx of Systolic CHF (EF 76-81%), COPD  , hx of LV thrombus/prior CVA w/ left sided hemiplegia on coumadin at home  -frequent falls admitted 11/27/16 for left hip fracture after fall. Prior to surgery developed possible aspiration PNA  On 2/5 +/- acute CHF, a/c renal failure. He has been treated aggressively with IV abx and diuresis with minimal improvement. Tx w/ steroids breifely  2/5 to 2/7 ? Pneumonitis. Unable to have surgery due to medical instability. Pt DNR status was changed by family member. Palliative care has been following .  He was seen by Speech therapy with modified barium swallow rec a D 3 diet w/ nectar thick liquids. His oxygen demands continue to be high requiring high flow O2 at 10l/m . PCCM has been asked to consult .   Has a hx of splenic rupture in past s/p splenectomy w/ critical illness in 2017 with resp failure requiring BIPAP support. He did undergo trach and peg due to severe dysphagia from previous CVA . [facility] He was decannulated ~ June 2017.   PAST MEDICAL HISTORY :   has a past medical history of Acute  respiratory failure ([place]); Anemia; Apical mural thrombus; CAD (coronary artery disease); Cerebral infarction ([place]); Chronic systolic CHF (congestive heart failure) ([place]); Dementia; Depression; Dysphagia; Falls; History of pneumonia; Hyperlipidemia; Hypertension; Ischemic cardiomyopathy; Left hemiplegia ([place]); Protein calorie malnutrition ([place]); Respiratory failure ([place]); Spleen injury; and Status post tracheostomy ([place]).  has a past surgical history that includes Tracheostomy tube placement (N/A, 02/22/2016); Coronary angioplasty with stent; Cardiac catheterization; PR LAP, SPLENECTOMY (01/07/2016); and ir generic historical (07/22/2016). Prior to Admission medications   Medication Sig Start Date End Date Taking? Authorizing Provider  Amino Acids-Protein Hydrolys (FEEDING SUPPLEMENT, PRO-STAT SUGAR FREE 64,) LIQD Take 60 mLs by mouth 2 (two) times daily. Pro Stat Awc 17-100 gram-kcal    Yes Historical Provider, MD  atorvastatin (LIPITOR) 40 MG tablet Take 40 mg by mouth at bedtime.   Yes Historical Provider, MD  busPIRone (BUSPAR) 10 MG tablet Take 10 mg by mouth 2 (two) times daily.   Yes Historical Provider, MD  divalproex (DEPAKOTE) 125 MG DR tablet Take 125 mg by mouth 3 (three) times daily.   Yes Historical Provider, MD  famotidine (PEPCID) 20 MG tablet Take 20 mg by mouth 2 (two) times daily.   Yes Historical Provider, MD  FLUoxetine HCl 60 MG TABS Take 1 tablet by mouth daily.   Yes Historical Provider, MD  ibuprofen (ADVIL,MOTRIN) 200 MG tablet Take 400 mg by mouth every 4 (four) hours as needed for moderate pain.  Yes Historical Provider, MD  insulin lispro (HUMALOG KWIKPEN) 100 UNIT/ML KiwkPen Inject 5 Units into the skin every evening.   Yes Historical Provider, MD  ipratropium-albuterol (DUONEB) 0.5-2.5 (3) MG/3ML SOLN Take 3 mLs by nebulization 2 (two) times daily.    Yes Historical Provider, MD  LORazepam (ATIVAN) 0.5 MG tablet Take 1 tablet (0.5 mg total) by mouth every 6 (six) hours as  needed for anxiety. 09/14/16  Yes Estill Dooms, MD  Menthol, Topical Analgesic, (BIOFREEZE EX) Apply 1 application topically daily as needed (pain).    Yes Historical Provider, MD  metoprolol succinate (TOPROL-XL) 25 MG 24 hr tablet Take 25 mg by mouth 2 (two) times daily.   Yes Historical Provider, MD  Omega-3 Fatty Acids (FISH OIL PO) Take 1 g by mouth daily. Reported on 03/10/2016    Yes Historical Provider, MD  OxyCODONE HCl, Abuse Deter, (OXAYDO) 5 MG TABA Take 5 mg by mouth every 4 (four) hours as needed.    Yes Historical Provider, MD  OXYGEN Inhale 3 L into the lungs daily as needed (SOB).    Yes Historical Provider, MD  pregabalin (LYRICA) 75 MG capsule Take 75 mg by mouth 2 (two) times daily.   Yes Historical Provider, MD  Sennosides (SENNA LAX PO) Take 2 tablets by mouth 2 (two) times daily.    Yes Historical Provider, MD  torsemide (DEMADEX) 10 MG tablet Take 10 mg by mouth daily.   Yes Historical Provider, MD  vitamin B-12 (CYANOCOBALAMIN) 1000 MCG tablet Take 1,000 mcg by mouth daily.   Yes Historical Provider, MD  vitamin C (ASCORBIC ACID) 500 MG tablet Take 500 mg by mouth 2 (two) times daily. 10,22   Yes Historical Provider, MD  warfarin (COUMADIN) 4 MG tablet Take 3.5 mg by mouth at bedtime. Every evening    Yes Historical Provider, MD  zinc sulfate 220 (50 Zn) MG capsule Take 220 mg by mouth daily.    Yes Historical Provider, MD   Allergies  Allergen Reactions  . Codeine Anaphylaxis and Nausea And Vomiting  . Hydrocodone Other (See Comments)    On MAR  . Hydrocodone-Acetaminophen Nausea And Vomiting  . Tegaderm Ag Mesh [Silver] Other (See Comments)    On MAR  . Lisinopril Itching and Rash  . Tylenol [Acetaminophen] Nausea Only    FAMILY HISTORY:  family history includes Alcohol abuse in his father, maternal uncle, and mother; Coronary artery disease in his father and mother; Hypertension in his father and mother. SOCIAL HISTORY:  reports that he has quit smoking. His  smoking use included Cigarettes. He has a 76.00 pack-year smoking history. He has never used smokeless tobacco. He reports that he does not drink alcohol or use drugs.  REVIEW OF SYSTEMS:   Constitutional: + malaise/fatigue  HENT: dry mouth  Respiratory: +sob , cough   Cardiovascular: Negative for chest pain, palpitations, orthopnea, claudication, leg   Gastrointestinal: +dysphagia  Musculoskeletal: +falls  Skin: Negative for itching and rash.  Neurological: +intermittent confusion   SUBJECTIVE:  Pt is awake in bed. O2 needs decreased , O2 adequate on 6l/m  Family at bedside with multiple questions    VITAL SIGNS: Temp:  [97 F (36.1 C)-102 F (38.9 C)] 97 F (36.1 C) (02/17 1316) Pulse Rate:  [43-100] 66 (02/17 1316) Resp:  [13-32] 13 (02/17 1316) BP: (106-128)/(62-89) 109/78 (02/17 1316) SpO2:  [83 %-96 %] 95 % (02/17 1316) Weight:  [232 lb 1.6 oz (105.3 kg)] 232 lb 1.6 oz (105.3 kg) (02/17  0500)  PHYSICAL EXAMINATION: General:  Elderly , ill appearing on O2  Neuro: alert, f/c , left sided weakness  HEENT: [place] /AT , dry mucosa  Cardiovascular:  RRR , no m/r/g  Lungs: BB crackles  Abdomen:  Soft , BS +  Musculoskeletal:  Left sided weakness  Skin:  No rash    Recent Labs Lab 12/08/16 0738 12/09/16 0329 12/10/16 0415  NA 155* 154* 151*  K 4.0 4.3 3.9  CL 113* 112* 109  CO2 32 30 28  BUN 85* 83* 82*  CREATININE 2.71* 2.79* 3.06*  GLUCOSE 172* 135* 209*    Recent Labs Lab 12/08/16 0738 12/09/16 0329 12/10/16 0415  HGB 14.4 14.0 13.4  HCT 44.6 43.1 41.9  WBC 29.4* 20.4* 22.7*  PLT 197 199 175   Dg Chest Port 1 View  Result Date: 12/10/2016 CLINICAL DATA:  Fever and shortness of breath EXAM: PORTABLE CHEST 1 VIEW COMPARISON:  December 08, 2016 FINDINGS: There remains interstitial and patchy alveolar edema. There is focal airspace consolidation in the left lower lobe. There is cardiomegaly with mild pulmonary venous hypertension. There is a a rather minimal  right pleural effusion. No adenopathy. No pneumothorax. There is an old healed fracture of the right clavicle. IMPRESSION: Findings felt to be most indicative of a degree of congestive heart failure. Superimposed consolidation in the left base may represent pneumonia or aspiration pneumonitis. Pneumonitis and congestive heart failure may present concurrently. Cardiomegaly is stable. Old healed fracture right clavicle. No evident pneumothorax. Electronically Signed   By: Lowella Grip III M.D.   On: 12/10/2016 07:30    ASSESSMENT / PLAN: 1. Aspiration PNA vs Pneumonitis +/- volume overload/COPD  . Continues to run fever and wbc remains elevated.   Plan  Tr  lactate and procalcitonin  Check BC and sputum (if able )  Check UA  Check cxr in am Change to Meropenem /Vanc  Diuresis as able  Increase Duoneb Four times a day     2. Decompensated Systolic CHF w/ echo 50-93% EF   Plan  Diuresis as kidney fxn will allow    3. Acute on Chronic Renal Failure (admission scr 1.9 ,  5/12017 0.99)  Scr tr up , good UOP with neg bal.   Plan Avoid nephrotoxins   4. . Hx of LV thrombus with coumadin prior to admission , repeat Echo showed EF 25-30% w/ no thrombus .   Plan  Off anticoagulation  5. Dysphagia -high risk for aspiration   Plan  Cont w/  D-3 Diet w/ nectar thick liquids  Would limit pain meds as able .   6. Hip Fracture   Plan  Per ortho/Primary     Shawn Dannenberg NP-C  Pulmonary and [place] Pager: [phone]  12/10/2016, 1:35 PM

## 2016-12-10 NOTE — Progress Notes (Addendum)
Daily Progress Note   Patient Name: Curtis Keller       Date: 12/10/2016 DOB: 1955-10-29  Age: 61 y.o. MRN#: [id number] Attending Physician: Jonetta Osgood, MD Primary Care Physician: Gildardo Cranker, DO Admit Date: 11/26/2016  Reason for Consultation/Follow-up: Establishing goals of care in the setting of hip fracture, aspiration, CHF and renal failure.  Subjective  I have reviewed medical records including EPIC notes, labs, echo and imaging, received report from Dr. Sloan Leiter and the bedside RNs, assessed the patient and then met at the bedside along with the patient, his daughter Curtis Keller, and his sister Curtis Keller (retired Therapist, sports).    We discussed a brief life review of the patient.  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  He has a history of stroke secondary to apical thrombus - he has residual hemiplegia and dysphagia.  He also suffered a ruptured spleen.  In 2017 he was treated for pneumonia and acute parotitis.  His respiratory failure did not improve and for a while he required tracheostomy.  He spent time at [facility].  He eventually improved and the trach was removed.  I believe he now resides at [facility].  Currently he is suffering with a hip fracture.  He has not been stable enough for surgery.  His kidney function has steadily declined since admission and he has been diagnosed with heart failure (EF 25%).  He is requiring high flow oxygen at 10 liters and his mental status appears to wax and wane.  His family his quite distressed.  The HCPOA is his daughter Curtis Keller who has great difficulty coming to the hospital.  His daughter Curtis Keller appears to understand his predicament and is supportive of the current medical recommendation for comfort care.  The patient's sister, Curtis Keller, a retired Therapist, sports,  hyperventilates as she tells me he was fine until he aspirated here in the hospital.  Per Curtis Keller, Curtis Keller is now insisting that her father be made a "full code" and that he receive IV fluids. Spent a great deal of time with Curtis Keller - reviewing the most recent labs, echo and chest xrays.  I explained to both [place] that IV fluids would likely make him worse given his heart failure and may even cause complete respiratory failure.  Still they pleaded for IV fluids.   After  our conversation I changed the patient's code status and talked with Dr. Sloan Leiter.  He decided to consult CCM.  The family was greatly appreciative of the CCM consultation.  I gave Tonya my cell phone number and asked her to call me when Curtis Keller comes to the Hospital.  Assessment: 61 yo male with history of stroke, hemiplegia, dysphagia, alcoholism and previously undiagnosed heart disease.  Now hospitalized with hip fracture, systolic heart failure, renal failure, aspiration and resultant respiratory failure.   Family with differing points of view.  Patient would be best served by comfort measures.   Patient Profile/HPI: 61 y.o. male  with past medical history ofHTN, systolic CHF EF 03-54%,SFK, DM type II, AAA, COPD, chronic respiratory failure on 2 L of NCoxygen at night, CVA with residual left-sided hemiparesis/dysphagia, apical thrombus on Coumadin, splenectomy s/p fall with complications resulting in tracheostomy x 6 weeks admitted on 11/26/2016 with left hip fracture s/p fall at SNF. Awaiting hip repair d/t concern for respiratory status aspiration pneumonia and heart failure exacerbation. High risk for post-op delirium as well as he is already exhibiting signs of delirium.   Length of Stay: 13  Current Medications: Scheduled Meds:  . ampicillin-sulbactam (UNASYN) IV  3 g Intravenous Q12H  . aspirin EC  81 mg Oral Daily  . atorvastatin  40 mg Oral QHS  . busPIRone  10 mg Oral BID  . divalproex  125 mg Oral TID  .  famotidine  20 mg Oral BID  . FLUoxetine  60 mg Oral Daily  . hydrALAZINE  12.5 mg Oral q12n4p  . insulin aspart  0-9 Units Subcutaneous TID WC  . ipratropium-albuterol  3 mL Nebulization TID  . lidocaine  1 patch Transdermal Q24H  . mouth rinse  15 mL Mouth Rinse BID  . metoprolol succinate  25 mg Oral Daily  . polyethylene glycol  17 g Oral Daily  . potassium chloride  20 mEq Oral Daily  . pregabalin  75 mg Oral Daily  . senna-docusate  1 tablet Oral BID    Continuous Infusions: . sodium chloride 10 mL/hr at 12/10/16 0700    PRN Meds: acetaminophen, fentaNYL (SUBLIMAZE) injection, glycopyrrolate, haloperidol lactate, methocarbamol, morphine injection, oxyCODONE, RESOURCE THICKENUP CLEAR  Physical Exam        Wd male, appears older than stated age, mildly confused. Brady cardic Crackles / wet Abdomen soft  Vital Signs: BP 124/89 (BP Location: Left Arm)   Pulse (!) 43   Temp 97.9 F (36.6 C) (Oral)   Resp 15   Ht _0  (1.88 m)   Wt 105.3 kg (232 lb 1.6 oz)   SpO2 94%   BMI 29.80 kg/m  SpO2: SpO2: 94 % O2 Device: O2 Device: High Flow Nasal Cannula O2 Flow Rate: O2 Flow Rate (L/min): 6 L/min  Intake/output summary:  Intake/Output Summary (Last 24 hours) at 12/10/16 1146 Last data filed at 12/10/16 0900  Gross per 24 hour  Intake              480 ml  Output              700 ml  Net             -220 ml   LBM: Last BM Date: 12/07/16 Baseline Weight: Weight: 102.1 kg (225 lb) Most recent weight: Weight: 105.3 kg (232 lb 1.6 oz)       Palliative Assessment/Data:    Flowsheet Rows   Flowsheet Row Most Recent Value  Intake Tab  Referral Department  Hospitalist  Unit at Time of Referral  ICU  Palliative Care Primary Diagnosis  Cardiac  Date Notified  11/30/16  Palliative Care Type  New Palliative care  Reason for referral  Clarify Goals of Care  Date of Admission  11/26/16  Date first seen by Palliative Care  11/30/16  # of days Palliative referral response  time  0 Day(s)  # of days IP prior to Palliative referral  4  Clinical Assessment  Psychosocial & Spiritual Assessment  Palliative Care Outcomes      Patient Active Problem List   Diagnosis Date Noted  . DNR (do not resuscitate)   . Ischemic cardiomyopathy   . Goals of care, counseling/discussion   . Palliative care encounter   . Acute systolic congestive heart failure ([place])   . Acute and chronic respiratory failure with hypoxia ([place]) 11/28/2016  . Hip fracture ([place]) 11/27/2016  . Closed nondisplaced intertrochanteric fracture of left femur ([place]) 11/27/2016  . Chronic respiratory failure ([place]) 11/27/2016  . Fall at nursing home 07/17/2016  . Diabetes mellitus, type II, insulin dependent ([place]) 06/12/2016  . GERD (gastroesophageal reflux disease) 05/07/2016  . COPD (chronic obstructive pulmonary disease) ([place]) 04/18/2016  . Chronic anticoagulation 03/22/2016  . S/P splenectomy 03/13/2016  . PEG (percutaneous endoscopic gastrostomy) status ([place]) 03/13/2016  . MRSA (methicillin resistant Staphylococcus aureus) septicemia ([place]) 03/13/2016  . Aspiration pneumonia ([place]) 03/13/2016  . Dysphagia 03/13/2016  . Acute blood loss anemia 03/13/2016  . Hyperlipidemia 03/13/2016  . Heart failure, chronic systolic ([place])   . Hypertension   . Depression   . Status post tracheostomy ([place])   . Cardiomyopathy ([place])   . Apical mural thrombus   . Protein calorie malnutrition ([place])   . Intraparenchymal hematoma of brain due to trauma [facility])     Palliative Care Plan    Recommendations/Plan:  PMT will continue to follow Mr. Bhakta and assist the family with [place]  Appreciate CCM recommendations.  Sister and eldest dtr need to know that they have given him his best chance.  Goals of Care and Additional Recommendations:  Limitations on Scope of Treatment: Full Scope Treatment  Code Status:  Full code  Prognosis:   < 4 weeks   Discharge Planning:  To Be Determined possibly Hospice  house.  Care plan was discussed with Dr. Sloan Leiter, Bedside RNs, Dtr Curtis Keller and sister Curtis Keller  Thank you for allowing the Palliative Medicine Team to assist in the care of this patient.  Total time spent:  70 min. [place] in 8:00, time out 8:20 Time in 11:00, time out 11:50    Greater than 50%  of this time was spent counseling and coordinating care related to the above assessment and plan.  Imogene Burn, PA-C Palliative Medicine  Please contact Palliative MedicineTeam phone at [phone] for questions and concerns between 7 am - 7 pm.   Please see AMION for individual provider pager numbers.

## 2016-12-11 ENCOUNTER — Inpatient Hospital Stay (HOSPITAL_COMMUNITY): Payer: Medicare Other

## 2016-12-11 DIAGNOSIS — Z7189 Other specified counseling: Secondary | ICD-10-CM

## 2016-12-11 LAB — COMPREHENSIVE METABOLIC PANEL
ALBUMIN: 1.7 g/dL — AB (ref 3.5–5.0)
ALT: 19 U/L (ref 17–63)
ANION GAP: 10 (ref 5–15)
AST: 26 U/L (ref 15–41)
Alkaline Phosphatase: 84 U/L (ref 38–126)
BUN: 75 mg/dL — ABNORMAL HIGH (ref 6–20)
CO2: 29 mmol/L (ref 22–32)
Calcium: 8.8 mg/dL — ABNORMAL LOW (ref 8.9–10.3)
Chloride: 110 mmol/L (ref 101–111)
Creatinine, Ser: 2.77 mg/dL — ABNORMAL HIGH (ref 0.61–1.24)
GFR calc Af Amer: 27 mL/min — ABNORMAL LOW (ref >60)
GFR calc non Af Amer: 23 mL/min — ABNORMAL LOW (ref >60)
GLUCOSE: 179 mg/dL — AB (ref 65–99)
POTASSIUM: 4.3 mmol/L (ref 3.5–5.1)
SODIUM: 149 mmol/L — AB (ref 135–145)
Total Bilirubin: 0.6 mg/dL (ref 0.3–1.2)
Total Protein: 6.7 g/dL (ref 6.5–8.1)

## 2016-12-11 LAB — GLUCOSE, CAPILLARY
GLUCOSE-CAPILLARY: 182 mg/dL — AB (ref 65–99)
GLUCOSE-CAPILLARY: 222 mg/dL — AB (ref 65–99)
GLUCOSE-CAPILLARY: 204 mg/dL — AB (ref 65–99)
Glucose-Capillary: 200 mg/dL — ABNORMAL HIGH (ref 65–99)

## 2016-12-11 LAB — CBC
HCT: 43.9 % (ref 39.0–52.0)
Hemoglobin: 14.3 g/dL (ref 13.0–17.0)
MCH: 33.3 pg (ref 26.0–34.0)
MCHC: 32.6 g/dL (ref 30.0–36.0)
MCV: 102.3 fL — ABNORMAL HIGH (ref 78.0–100.0)
Platelets: 167 10*3/uL (ref 150–400)
RBC: 4.29 MIL/uL (ref 4.22–5.81)
RDW: 16.2 % — ABNORMAL HIGH (ref 11.5–15.5)
WBC: 15.5 10*3/uL — ABNORMAL HIGH (ref 4.0–10.5)

## 2016-12-11 LAB — MAGNESIUM: Magnesium: 2.4 mg/dL (ref 1.7–2.4)

## 2016-12-11 LAB — LACTIC ACID, PLASMA: LACTIC ACID, VENOUS: 1.7 mmol/L (ref 0.5–1.9)

## 2016-12-11 LAB — PROCALCITONIN: Procalcitonin: 0.95 ng/mL

## 2016-12-11 LAB — HEPARIN LEVEL (UNFRACTIONATED): Heparin Unfractionated: 0.1 IU/mL — ABNORMAL LOW (ref 0.30–0.70)

## 2016-12-11 MED ORDER — HEPARIN (PORCINE) IN NACL 100-0.45 UNIT/ML-% IJ SOLN
1950.0000 [IU]/h | INTRAMUSCULAR | Status: DC
  Administered 2016-12-11: 1300 [IU]/h via INTRAVENOUS
  Administered 2016-12-12 – 2016-12-13 (×2): 1950 [IU]/h via INTRAVENOUS
  Filled 2016-12-11 (×4): qty 250

## 2016-12-11 MED ORDER — HEPARIN BOLUS VIA INFUSION
2500.0000 [IU] | Freq: Once | INTRAVENOUS | Status: AC
  Administered 2016-12-11: 2500 [IU] via INTRAVENOUS
  Filled 2016-12-11: qty 2500

## 2016-12-11 MED ORDER — VANCOMYCIN HCL 10 G IV SOLR
1250.0000 mg | INTRAVENOUS | Status: DC
  Administered 2016-12-12: 1250 mg via INTRAVENOUS
  Filled 2016-12-11: qty 1250

## 2016-12-11 MED ORDER — HEPARIN BOLUS VIA INFUSION
4000.0000 [IU] | Freq: Once | INTRAVENOUS | Status: AC
  Administered 2016-12-11: 4000 [IU] via INTRAVENOUS
  Filled 2016-12-11: qty 4000

## 2016-12-11 MED ORDER — MAGNESIUM SULFATE 2 GM/50ML IV SOLN
2.0000 g | Freq: Once | INTRAVENOUS | Status: AC
  Administered 2016-12-11: 2 g via INTRAVENOUS
  Filled 2016-12-11: qty 50

## 2016-12-11 NOTE — Progress Notes (Signed)
amion                        PROGRESS NOTE        PATIENT DETAILS Name: Curtis Keller Age: 61 y.o. Sex: male Date of Birth: October 31, 1955 Admit Date: 11/26/2016 Admitting Physician Clydie Braun, MD UJW:[id number], Maxine Glenn, DO  Brief Narrative: Patient is a 61 y.o. male with history chronic systolic heart failure (EF 25-30%), history of LV thrombus, prior CVA with chronic left-sided hemiplegia-multiple falls over the past few years causing splenic rupture-presented to the hospital on 2/3 following a mechanical fall, found to have a left hip fracture. Unfortunately hospital course has been complicated by development of acute hypoxic respiratory failure, felt due to aspiration PNA vs acute systolic CHF, worsening renal function and hyponatremia. After extensive discussion with patient, family-he is now a DO NOT RESUSCITATE. Palliative care continues to follow and engage with family, however on 2/17, family has decided to revoke the DO NOT RESUSCITATE.  Subjective: On 10 L high flow oxygen-continues to have pain in his left hip. Febrile last night.  Assessment/Plan:  Closed Left hip fracture: Secondary to a mechanical fall-unfortunately before hip repair could be attempted-patient developed hypoxic resp failure due to aspiration pneumonia and acute systolic heart failure. Seen by orthopedics, currently medical management per orthopedics due to his medical issues and very high risk for adverse cardiopulmonary outcome during surgery.   Acute hypoxemic respiratory failure secondary to aspiration pneumonia and acute on chronic systolic heart failure: Patient had a aspiration event on 2/5 requiring transfer to stepdown and 100% nonrebreather mask. He was subsequently treated with IV antibiotics and IV Lasix. He initially improved and his oxygen was titrated down to 3 L via nasal cannula. He completed a course of IV Zosyn. He unfortunately developed worsening respiratory failure (suspect he again  aspirated), and had to be restarted on intravenous antibiotics. He was also diuresed with IV Lasix. Currently still on high flow oxygen but clinically marginally improved. Remains very high risk for future aspiration.  Notes sister claims that she had signed a waiver at the nursing facility and was feeding the patient at well assuming responsibility for aspiration, she has been seen feeding the patient against nursing advice even at this facility. She has been adequately warned by previous physician Dr. Jerral Ralph.  For now continue supportive care, antibiotics, diuretics as needed, supportive care with oxygen never lies her treatments and pulmonary toiletry. He was also seen by pulmonary who deemed that he has a poor prognosis and they recommended against intubating this patient.  Family continues to have unrealistic expectations, they had initially agreed for DO NOT RESUSCITATE but have subsequently revoked it. Palliative care also following.   Acute renal failure: Due to acute systolic CHF decompensation, initially diuresed, now appears euvolemic, monitor off Lasix.  Hypernatremia: Improving with Lasix being held.  Dysphagia: Speech therapy following, underwent modified barium swallow-this is a chronic issue-patient has a prior history of CVA-he previously had a tube in place. Recommendations are to try a dysphagia 3 solids with nectar thick liquids. Patient's sister giving him clear liquids despite being warned about the consequences, she claims that she had signed a waiver at the nursing facility and was doing that at the nursing facility also. This could be causing recurrent infection. She has been warned. Speech following, antibiotics as above, continue supportive care.  History of left apical thrombus: As on Coumadin, place on heparin drip, remains at risk for falls and bleeding,  but this risk has been present before, sister wants anticoagulation which will be continued, defer to PCP on long-term  anticoagulation.  Acute metabolic encephalopathy/dementia: Supportive care, remains at risk for delirium.  Leukocytosis: 22 pneumonia continue antibiotics and monitor.   Hx of COPD: not in exacerbation-see above.   History of CVA with chronic left sided weakness: Previously on anticoagulation-left-sided deficits are unchanged. Continue statin. See above regarding anticoagulation  GERD: Continue Pepcid  Type 2 diabetes: CBGs stable-continue SSI  History of anxiety/depression/mild dementia: Continue Depakote, fluoxetine and BuSpar.  Frequent falls: Has had frequent falls in the past few years-one episode of fall resulted in a splenic laceration requiring splenectomy. Unfortunately, patient also has a history of LV thrombus and a CVA with left hemiplegia. Previous M.D. had detailed discussions about risks and benefits with patient and sister who for no one to continue anticoagulation, he was on Coumadin prior to this admission, for now heparin drip.  Palliative care: Overall prognosis is extremely poor due to recurrent aspiration, underlying left-sided hemiplegia and dysphagia, recurrent falls, mural thrombus, falls with history of anticoagulation, hypoxic respiratory failure. Family initially wanted to make patient DO NOT RESUSCITATE but was then changed to full code. Palliative care following prognosis remains poor, critical care also recommends against intubation and full code and they agreed that his prognosis is poor.   DVT Prophylaxis:  Heparin   Code Status: Full code  Family Communication: None bedside  Disposition Plan: Remain inpatient-remain in SDU   Antimicrobial agents: Anti-infectives    Start     Dose/Rate Route Frequency Ordered Stop   12/12/16 2200  vancomycin (VANCOCIN) 1,250 mg in sodium chloride 0.9 % 250 mL IVPB     1,250 mg 166.7 mL/hr over 90 Minutes Intravenous Every 24 hours 12/11/16 0934     12/10/16 1800  Ampicillin-Sulbactam (UNASYN) 3 g in sodium  chloride 0.9 % 100 mL IVPB  Status:  Discontinued     3 g 200 mL/hr over 30 Minutes Intravenous Every 12 hours 12/10/16 1127 12/10/16 1508   12/10/16 1600  meropenem (MERREM) 1 g in sodium chloride 0.9 % 100 mL IVPB     1 g 200 mL/hr over 30 Minutes Intravenous Every 12 hours 12/10/16 1514     12/10/16 1600  vancomycin (VANCOCIN) IVPB 750 mg/150 ml premix  Status:  Discontinued     750 mg 150 mL/hr over 60 Minutes Intravenous Every 12 hours 12/10/16 1514 12/11/16 0934   12/06/16 1230  Ampicillin-Sulbactam (UNASYN) 3 g in sodium chloride 0.9 % 100 mL IVPB  Status:  Discontinued     3 g 200 mL/hr over 30 Minutes Intravenous Every 6 hours 12/06/16 1139 12/10/16 1127   11/28/16 1530  piperacillin-tazobactam (ZOSYN) IVPB 3.375 g     3.375 g 12.5 mL/hr over 240 Minutes Intravenous Every 8 hours 11/28/16 1518 12/04/16 2359   11/28/16 1530  vancomycin (VANCOCIN) IVPB 750 mg/150 ml premix  Status:  Discontinued     750 mg 150 mL/hr over 60 Minutes Intravenous Every 12 hours 11/28/16 1518 12/01/16 1031   11/28/16 1520  ceFAZolin (ANCEF) IVPB 2g/100 mL premix  Status:  Discontinued     2 g 200 mL/hr over 30 Minutes Intravenous To [facility] 11/28/16 1247 11/28/16 2334   11/28/16 1000  piperacillin-tazobactam (ZOSYN) IVPB 3.375 g  Status:  Discontinued     3.375 g 12.5 mL/hr over 240 Minutes Intravenous Every 8 hours 11/28/16 0832 11/28/16 1518   11/28/16 1000  vancomycin (VANCOCIN) IVPB 750 mg/150  ml premix  Status:  Discontinued     750 mg 150 mL/hr over 60 Minutes Intravenous Every 12 hours 11/28/16 1610 11/28/16 1518      Procedures: Echo 2/5 - - Very limited image quality, however LVEF is severely impaired, estimated at 25-30% with akinesis of all of the apical segments and mid anteroseptal, anterior and anterolateral segments. There is no thrombus on the contrast images. A cardiac MRI is recommended for further LVEF evaluation.   CONSULTS:  Palliative care, PCCM, Cardiology and  Orthopedic surgery   Time spent: 32- minutes-Greater than 50% of this time was spent in counseling, explanation of diagnosis, planning of further management, and coordination of care.  MEDICATIONS: Scheduled Meds: . aspirin EC  81 mg Oral Daily  . atorvastatin  40 mg Oral QHS  . busPIRone  10 mg Oral BID  . dextromethorphan-guaiFENesin  1 tablet Oral BID  . divalproex  125 mg Oral TID  . famotidine  20 mg Oral BID  . FLUoxetine  60 mg Oral Daily  . hydrALAZINE  12.5 mg Oral q12n4p  . insulin aspart  0-9 Units Subcutaneous TID WC  . ipratropium-albuterol  3 mL Nebulization Q6H  . lidocaine  1 patch Transdermal Q24H  . mouth rinse  15 mL Mouth Rinse BID  . meropenem (MERREM) IV  1 g Intravenous Q12H  . metoprolol succinate  25 mg Oral Daily  . polyethylene glycol  17 g Oral Daily  . potassium chloride  20 mEq Oral Daily  . pregabalin  75 mg Oral Daily  . senna-docusate  1 tablet Oral BID  . [START ON 12/12/2016] vancomycin  1,250 mg Intravenous Q24H   Continuous Infusions: . sodium chloride 10 mL/hr at 12/11/16 0700   PRN Meds:.acetaminophen, haloperidol lactate, methocarbamol, oxyCODONE, RESOURCE THICKENUP CLEAR   PHYSICAL EXAM: Vital signs: Vitals:   12/10/16 2254 12/10/16 2300 12/11/16 0300 12/11/16 0755  BP:  119/76 123/79 139/85  Pulse: 89 89 90 94  Resp: 17 16 (!) 23 (!) 23  Temp:  100.2 F (37.9 C) 98 F (36.7 C) (!) 100.6 F (38.1 C)  TempSrc:  Oral Oral Oral  SpO2: (!) 89% 92% 95% 92%  Weight:   114.5 kg (252 lb 8 oz)   Height:       Filed Weights   12/09/16 0500 12/10/16 0500 12/11/16 0300  Weight: 109.6 kg (241 lb 11.2 oz) 105.3 kg (232 lb 1.6 oz) 114.5 kg (252 lb 8 oz)   Body mass index is 32.42 kg/m.   General appearance :Awake, alert, Looks very frail and slightly lethargic. Eyes:, pupils equally reactive to light and accomodation HEENT: Atraumatic and Normocephalic Neck: supple, no JVD. No cervical lymphadenopathy. Resp:Good air entry  bilaterally,Continues to have transmitted upper airway sounds-he has rales up to his mid chest anteriorly.  CVS: S1 S2 regular GI: Bowel sounds present, Non tender and not distended with no gaurding, rigidity or rebound.No organomegaly Extremities: B/L Lower Ext shows no edema, both legs are warm to touch Neurology: Left hemiplegia Musculoskeletal:No digital cyanosis Skin:No Rash, warm and dry Wounds:N/A  I have personally reviewed following labs and imaging studies  LABORATORY DATA: CBC:  Recent Labs Lab 12/07/16 0312 12/08/16 0738 12/09/16 0329 12/10/16 0415 12/11/16 0257  WBC 27.6* 29.4* 20.4* 22.7* 15.5*  HGB 16.1 14.4 14.0 13.4 14.3  HCT 49.3 44.6 43.1 41.9 43.9  MCV 101.6* 102.8* 102.6* 102.2* 102.3*  PLT 227 197 199 175 167    Basic Metabolic Panel:  Recent Labs Lab  12/07/16 1610 12/08/16 0738 12/09/16 0329 12/10/16 0415 12/11/16 0257  NA 153* 155* 154* 151* 149*  K 4.6 4.0 4.3 3.9 4.3  CL 110 113* 112* 109 110  CO2 31 32 30 28 29   GLUCOSE 207* 172* 135* 209* 179*  BUN 86* 85* 83* 82* 75*  CREATININE 2.69* 2.71* 2.79* 3.06* 2.77*  CALCIUM 9.1 9.0 8.8* 8.7* 8.8*  MG  --   --   --   --  2.4    GFR: Estimated Creatinine Clearance: 37.7 mL/min (by C-G formula based on SCr of 2.77 mg/dL (H)).  Liver Function Tests:  Recent Labs Lab 12/08/16 0738 12/11/16 0257  AST 21 26  ALT 17 19  ALKPHOS 84 84  BILITOT 0.6 0.6  PROT 6.3* 6.7  ALBUMIN 1.9* 1.7*   No results for input(s): LIPASE, AMYLASE in the last 168 hours. No results for input(s): AMMONIA in the last 168 hours.  Coagulation Profile:  Recent Labs Lab 12/06/16 0151  INR 1.18    Cardiac Enzymes: No results for input(s): CKTOTAL, CKMB, CKMBINDEX, TROPONINI in the last 168 hours.  BNP (last 3 results) No results for input(s): PROBNP in the last 8760 hours.  HbA1C: No results for input(s): HGBA1C in the last 72 hours.  CBG:  Recent Labs Lab 12/10/16 0755 12/10/16 1314  12/10/16 1716 12/10/16 2108 12/11/16 0802  GLUCAP 159* 159* 158* 234* 200*    Lipid Profile: No results for input(s): CHOL, HDL, LDLCALC, TRIG, CHOLHDL, LDLDIRECT in the last 72 hours.  Thyroid Function Tests: No results for input(s): TSH, T4TOTAL, FREET4, T3FREE, THYROIDAB in the last 72 hours.  Anemia Panel: No results for input(s): VITAMINB12, FOLATE, FERRITIN, TIBC, IRON, RETICCTPCT in the last 72 hours.  Urine analysis:    Component Value Date/Time   COLORURINE YELLOW 12/10/2016 1609   APPEARANCEUR HAZY (A) 12/10/2016 1609   LABSPEC 1.020 12/10/2016 1609   PHURINE 5.0 12/10/2016 1609   GLUCOSEU NEGATIVE 12/10/2016 1609   HGBUR LARGE (A) 12/10/2016 1609   BILIRUBINUR NEGATIVE 12/10/2016 1609   KETONESUR NEGATIVE 12/10/2016 1609   PROTEINUR 100 (A) 12/10/2016 1609   NITRITE NEGATIVE 12/10/2016 1609   LEUKOCYTESUR NEGATIVE 12/10/2016 1609    Sepsis Labs: Lactic Acid, Venous    Component Value Date/Time   LATICACIDVEN 1.7 12/11/2016 0257    MICROBIOLOGY: Recent Results (from the past 240 hour(s))  Culture, blood (Routine X 2) w Reflex to ID Panel     Status: None (Preliminary result)   Collection Time: 12/10/16  3:31 PM  Result Value Ref Range Status   Specimen Description BLOOD LEFT ANTECUBITAL  Final   Special Requests BOTTLES DRAWN AEROBIC AND ANAEROBIC 5CC EA  Final   Culture NO GROWTH < 24 HOURS  Final   Report Status PENDING  Incomplete  Culture, blood (Routine X 2) w Reflex to ID Panel     Status: None (Preliminary result)   Collection Time: 12/10/16  3:38 PM  Result Value Ref Range Status   Specimen Description BLOOD RIGHT ANTECUBITAL  Final   Special Requests BOTTLES DRAWN AEROBIC AND ANAEROBIC 5CC EA  Final   Culture NO GROWTH < 24 HOURS  Final   Report Status PENDING  Incomplete    RADIOLOGY STUDIES/RESULTS: Dg Chest 1 View  Result Date: 11/27/2016 CLINICAL DATA:  Larey Seat today.  Left hip fracture. EXAM: CHEST 1 VIEW COMPARISON:  02/24/2016  FINDINGS: Diffuse interstitial coarsening is probably chronic. No consolidation. No effusion. No pneumothorax. New unchanged hilar, mediastinal and cardiac contours. IMPRESSION:  Chronic appearing interstitial coarsening. No consolidation or effusion. Electronically Signed   By: Ellery Plunk M.D.   On: 11/27/2016 01:14   Dg Pelvis 1-2 Views  Result Date: 11/27/2016 CLINICAL DATA:  Larey Seat today while trying to get out of bed unassisted. EXAM: PELVIS - 1-2 VIEW COMPARISON:  None. FINDINGS: There is an acute subcapital left hip fracture. No dislocation. No bone lesion or bony destruction. Pelvis appears intact. IMPRESSION: Subcapital left hip fracture Electronically Signed   By: Ellery Plunk M.D.   On: 11/27/2016 01:13   Ct Head Wo Contrast  Result Date: 11/27/2016 CLINICAL DATA:  Larey Seat getting out of bed today, acute LEFT hip fracture. History of hypertension, stroke. EXAM: CT HEAD WITHOUT CONTRAST TECHNIQUE: Contiguous axial images were obtained from the base of the skull through the vertex without intravenous contrast. COMPARISON:  CT HEAD January 26, 2016 FINDINGS: BRAIN: No intraparenchymal hemorrhage, mass effect, midline shift or acute large vascular territory infarcts. Old RIGHT greater than LEFT cerebellar infarcts, severe brachium contrast atrophy. Old RIGHT greater than LEFT pontine infarcts. Old LEFT thalamus and bilateral basal ganglia lacunar infarcts. Old small RIGHT occipital lobe infarct. LEFT inferior temporal lobe encephalomalacia is likely posttraumatic. Mild to moderate white matter changes compatible with chronic small vessel ischemic disease. No abnormal extra-axial fluid collection. Moderate ventriculomegaly on the basis of global parenchymal brain volume loss. VASCULAR: Mild to moderate calcific atherosclerosis of the carotid siphons. SKULL: No skull fracture. No significant scalp soft tissue swelling. SINUSES/ORBITS: Bilateral maxillary mucosal retention cysts. Mastoid air cells are  well aerated. The included ocular globes and orbital contents are non-suspicious. OTHER: Patient is edentulous. IMPRESSION: No acute intracranial process. Stable examination including numerous old supra- and infratentorial infarcts predominately within posterior circulation, moderate global brain atrophy. Electronically Signed   By: Awilda Metro M.D.   On: 11/27/2016 03:01   Dg Chest Port 1 View  Result Date: 12/11/2016 CLINICAL DATA:  Pneumonia EXAM: PORTABLE CHEST 1 VIEW COMPARISON:  December 10, 2016 FINDINGS: Interstitial and patchy alveolar edema are again noted. Focal airspace consolidation in the left lower lobe is slightly less apparent compared to 1 day prior. There is new atelectatic change in the right mid lung. There is cardiomegaly. There is a mild degree of pulmonary venous hypertension. There is equivocal right pleural effusion. No adenopathy. No pneumothorax. Old healed fracture right clavicle. IMPRESSION: Persistent changes of congestive heart failure. Less airspace consolidation left base compared to 1 day prior. The right midlung atelectasis. Stable cardiac silhouette. Electronically Signed   By: Bretta Bang III M.D.   On: 12/11/2016 07:16   Dg Chest Port 1 View  Result Date: 12/10/2016 CLINICAL DATA:  Fever and shortness of breath EXAM: PORTABLE CHEST 1 VIEW COMPARISON:  December 08, 2016 FINDINGS: There remains interstitial and patchy alveolar edema. There is focal airspace consolidation in the left lower lobe. There is cardiomegaly with mild pulmonary venous hypertension. There is a a rather minimal right pleural effusion. No adenopathy. No pneumothorax. There is an old healed fracture of the right clavicle. IMPRESSION: Findings felt to be most indicative of a degree of congestive heart failure. Superimposed consolidation in the left base may represent pneumonia or aspiration pneumonitis. Pneumonitis and congestive heart failure may present concurrently. Cardiomegaly is  stable. Old healed fracture right clavicle. No evident pneumothorax. Electronically Signed   By: Bretta Bang III M.D.   On: 12/10/2016 07:30   Dg Chest Port 1 View  Result Date: 12/08/2016 CLINICAL DATA:  Chronic CHF, aspiration pneumonia,  underlying COPD. EXAM: PORTABLE CHEST 1 VIEW COMPARISON:  Portable chest x-ray of December 07, 2016 FINDINGS: The lungs are slightly less well inflated today. The pulmonary interstitial markings remain increased with areas patchy confluence noted. There is no large pleural effusion. The cardiac silhouette remains enlarged. The pulmonary vascularity remains prominent centrally. There is calcification in the wall of the aortic arch. There is old deformity of the midshaft of the right clavicle. IMPRESSION: Decreased aeration of both lungs today which accentuates the interstitial markings. Persistent bilateral interstitial and alveolar opacities likely reflect pneumonia though mild CHF is suspected as well. Electronically Signed   By: David  [place] M.D.   On: 12/08/2016 07:19   Dg Chest Port 1 View  Result Date: 12/07/2016 CLINICAL DATA:  61 year old male with shortness of breath. Recent fall. Initial encounter. EXAM: PORTABLE CHEST 1 VIEW COMPARISON:  12/06/2016 and earlier. FINDINGS: Portable AP semi upright view at 0515 hours. Stable cardiomegaly and mediastinal contours. Widespread increased pulmonary interstitial markings are chronic to a degree. No superimposed pneumothorax or pulmonary edema. No pleural effusion is evident. Increased patchy retrocardiac opacity since 11/27/2016. Chronic right clavicle deformity. IMPRESSION: Cardiomegaly and chronic lung disease. Left lung base opacity has increased since 11/27/2016, suspicious for left lung base pneumonia. No pleural effusion identified. Electronically Signed   By: Odessa Fleming M.D.   On: 12/07/2016 07:43   Dg Chest Port 1 View  Result Date: 12/06/2016 CLINICAL DATA:  Shortness of breath . EXAM: PORTABLE CHEST 1  VIEW COMPARISON:  12/05/2016. FINDINGS: Cardiomegaly with progressive bilateral pulmonary infiltrates consistent with congestive heart failure and pulmonary edema. Bilateral pneumonia cannot be excluded . Small right pleural effusion cannot be excluded. No pneumothorax . Healed right clavicular fracture. IMPRESSION: 1. Cardiomegaly with progressive bilateral pulmonary infiltrates/ pulmonary edema. Findings consistent with congestive heart failure. Bilateral pneumonia cannot be excluded. Small right pleural effusion cannot be excluded. 2.  Low lung volumes. Electronically Signed   By: Maisie Fus  Register   On: 12/06/2016 06:50   Dg Chest Port 1 View  Result Date: 12/05/2016 CLINICAL DATA:  Shortness of breath . EXAM: PORTABLE CHEST 1 VIEW COMPARISON:  12/04/2016 . FINDINGS: Cardiomegaly with bilateral from interstitial prominence. No change from prior exam. No pleural effusion or pneumothorax P IMPRESSION: Cardiomegaly with bilateral mild interstitial prominence consistent interstitial edema again noted. No interim change from prior exam. Electronically Signed   By: Maisie Fus  Register   On: 12/05/2016 07:23   Dg Chest Port 1 View  Result Date: 12/04/2016 CLINICAL DATA:  Shortness of Breath EXAM: PORTABLE CHEST 1 VIEW COMPARISON:  12/03/2016 FINDINGS: Cardiomegaly. Mild interstitial prominence and vascular congestion. Question mild interstitial edema. No effusions or acute bony abnormality. IMPRESSION: Vascular congestion with interstitial prominence, question mild interstitial edema. Electronically Signed   By: Charlett Nose M.D.   On: 12/04/2016 07:11   Dg Chest Port 1 View  Result Date: 12/03/2016 CLINICAL DATA:  Shortness of Breath EXAM: PORTABLE CHEST 1 VIEW COMPARISON:  12/02/2016 FINDINGS: Cardiac shadow is enlarged. Lungs are well aerated bilaterally. Vascular congestion is noted without significant interstitial edema. No sizable effusion is noted. No bony abnormality is seen. IMPRESSION: Stable vascular  congestion. Electronically Signed   By: Alcide Clever M.D.   On: 12/03/2016 15:20   Dg Chest Port 1 View  Result Date: 12/02/2016 CLINICAL DATA:  Dyspnea EXAM: PORTABLE CHEST 1 VIEW COMPARISON:  Chest radiograph from one day prior. FINDINGS: Slightly right rotated chest radiograph. Stable cardiomediastinal silhouette with mild cardiomegaly. No pneumothorax. No pleural  effusion. Mild pulmonary edema, decreased. IMPRESSION: Mild congestive heart failure, improved. Electronically Signed   By: Delbert Phenix M.D.   On: 12/02/2016 08:06   Dg Chest Port 1 View  Addendum Date: 12/01/2016   ADDENDUM REPORT: 12/01/2016 08:08 ADDENDUM: There is an old healed right clavicle fracture, stable. Electronically Signed   By: Bretta Bang III M.D.   On: 12/01/2016 08:08   Result Date: 12/01/2016 CLINICAL DATA:  Shortness of Breath EXAM: PORTABLE CHEST 1 VIEW COMPARISON:  November 30, 2016 FINDINGS: Extensive airspace consolidation bilaterally, most severe in the left upper to mid lung zones, up remains without significant change. There is cardiomegaly with pulmonary venous hypertension. No adenopathy evident. No bone lesions. IMPRESSION: Widespread airspace consolidation bilaterally, somewhat more on the left than on the right. Evidence a degree of underlying pulmonary vascular congestion. Question multifocal pneumonia versus pulmonary edema; both entities may well exist concurrently. The overall appearance of the lungs and cardiomediastinal silhouette is stable compared to 1 day prior. Electronically Signed: By: Bretta Bang III M.D. On: 12/01/2016 08:02   Dg Chest Port 1 View  Result Date: 11/28/2016 CLINICAL DATA:  Shortness of breath. EXAM: PORTABLE CHEST 1 VIEW COMPARISON:  11/27/2016 and 02/24/2016 FINDINGS: The patient has progressive bilateral pulmonary infiltrates which may represent pulmonary edema or progressive pneumonia. Pulmonary vascularity appears normal in the overall heart size is normal. IMPRESSION:  Probable progressive bilateral pneumonia. Electronically Signed   By: Francene Boyers M.D.   On: 11/28/2016 15:31   Dg Chest Port 1 View  Result Date: 11/27/2016 CLINICAL DATA:  sob EXAM: PORTABLE CHEST 1 VIEW COMPARISON:  11/27/2016 FINDINGS: The heart is enlarged. There are airspace filling opacities bilaterally, particularly well seen at the left lung base. Patient is rotated. IMPRESSION: Bilateral airspace filling opacities consistent with pulmonary edema and/or infectious process. Electronically Signed   By: Norva Pavlov M.D.   On: 11/27/2016 22:17   Dg Chest Port 1v Same Day  Result Date: 11/30/2016 CLINICAL DATA:  Shortness of breath, cough today, pneumonia, history hypertension, prior respiratory failure, coronary disease, chronic systolic CHF, ischemic cardiomyopathy EXAM: PORTABLE CHEST 1 VIEW COMPARISON:  Portable exam 0922 hours compared to 11/28/2016 FINDINGS: Rotated to the LEFT with kyphotic positioning. Enlargement of cardiac silhouette. Stable mediastinal contours for degree of rotation. Extensive BILATERAL pulmonary infiltrates favor pneumonia over edema. No pleural effusion or pneumothorax. Bones unremarkable. IMPRESSION: Persistent BILATERAL pulmonary infiltrates question pneumonia. Enlargement of cardiac silhouette. Electronically Signed   By: Ulyses Southward M.D.   On: 11/30/2016 09:31   Dg Swallowing Func-speech Pathology  Result Date: 11/30/2016 Objective Swallowing Evaluation: Type of Study: MBS-Modified Barium Swallow Study Patient Details Name: Curtis Keller MRN: [id number] Date of Birth: July 04, 1956 Today's Date: 11/30/2016 Time: SLP Start Time (ACUTE ONLY): 1310-SLP Stop Time (ACUTE ONLY): 1325 SLP Time Calculation (min) (ACUTE ONLY): 15 min Past Medical History: Past Medical History: Diagnosis Date . Acute respiratory failure (HCC)   a. Spring 2017 following fall/splenectomy -->admitted to [facility]-->trach/g tube. . Anemia   a. 12/2015 ABL in setting of  fall/hematomas/splenic laceration req splenectomy. Marland Kitchen Apical mural thrombus   a. 11/2015 Echo: EF 25-30%, moderate size apical thrombus-->coumadin;  b. 12/2015 f/u Echo: EF 25-30%, ant/septa HK, no obvious large thrombus but cannot exclude small mural thrombus. Marland Kitchen CAD (coronary artery disease)   a. 12/2014 s/p BMS to the RCA. Marland Kitchen Cerebral infarction (HCC)  . Chronic systolic CHF (congestive heart failure) (HCC)   a. 12/2015 Echo: EF 25-30%. . Dementia  .  Depression  . Dysphagia  . Falls   a. 11/2015 traumatic fall with resultant trauma req splenectomy and prolonged hospitalization complicated by resp failure. Marland Kitchen History of pneumonia  . Hyperlipidemia  . Hypertension  . Ischemic cardiomyopathy   a. 12/2015 Echo: EF 25-30%, anterior and septal HK. Marland Kitchen Left hemiplegia (HCC)  . Protein calorie malnutrition (HCC)  . Respiratory failure (HCC)  . Spleen injury  . Status post tracheostomy (HCC)   a. 01/2016 in setting of ongoing resp failure and aspiration. Past Surgical History: Past Surgical History: Procedure Laterality Date . CARDIAC CATHETERIZATION   . CORONARY ANGIOPLASTY WITH STENT PLACEMENT   . IR GENERIC HISTORICAL  07/22/2016  IR GASTROSTOMY TUBE REMOVAL 07/22/2016 [name], MD [facility]-INTERV RAD . PR LAP, SPLENECTOMY  01/07/2016 . TRACHEOSTOMY TUBE PLACEMENT N/A 02/22/2016  Procedure: TRACHEOSTOMY;  Surgeon: [name], MD;  Location: [facility] OR;  Service: ENT;  Laterality: N/A; HPI: Pt. with PMH of HTN, chronic systolic CHF, chronic respiratory failure on 3L of oxygen, diabetes mellitus, recurrent fall, chronic anticoagulation, dyslipidemia; admitted on 11/26/2016, with complaint of A mechanical fall, was found to have closed left hip fracture.Patient developed aspiration pneumonia in the hospital. Currently surgery is on hold until pneumonia is better. Per MD pt was on dys 2/nectar thick liquids at SNF, but has been on thin liquids here. Pt had an MBS during admission to [facility] in 2017, but record not  available. Pt also had a trach at that time. Reproted has a history of dysphagia due to prior CVAs.  No Data Recorded Assessment / Plan / Recommendation CHL IP CLINICAL IMPRESSIONS 11/30/2016 Therapy Diagnosis Severe pharyngeal phase dysphagia Clinical Impression Pt demonstrates a severe oropharygneal dysphagia with decreased duration and magnitude of hyolaryngeal excursion leading to severe oropharyngeal residuals that are aspirated with and without sensation in further swallow attempts. Base of tongue propulsion of bolus is also poor and epiglottis does not deflect for airway protection. Thin liquids pose a severe risk of aspiration and nectar thick liquids reduce severity of aspiration events, but are still likely to be aspirated due to increased residuals with thick viscosity. A chin tuck significantly improves bolus transit if fully tucked against chest. Would suspect that carry over for this will be poor, but will trial dys 3 solids and nectar thick liquids with full supervision for a chin tuck to determine if it is achievable.  Impact on safety and function Severe aspiration risk   CHL IP TREATMENT RECOMMENDATION 11/30/2016 Treatment Recommendations Therapy as outlined in treatment plan below   Prognosis 11/30/2016 Prognosis for Safe Diet Advancement Guarded Barriers to Reach Goals Severity of deficits;Time post onset Barriers/Prognosis Comment -- CHL IP DIET RECOMMENDATION 11/30/2016 SLP Diet Recommendations Dysphagia 3 (Mech soft) solids;Nectar thick liquid Liquid Administration via Cup;Straw Medication Administration Whole meds with puree Compensations Chin tuck;Slow rate;Small sips/bites;Minimize environmental distractions Postural Changes Remain semi-upright after after feeds/meals (Comment);Seated upright at 90 degrees   CHL IP OTHER RECOMMENDATIONS 11/30/2016 Recommended Consults -- Oral Care Recommendations Oral care BID Other Recommendations Order thickener from pharmacy   CHL IP FOLLOW UP RECOMMENDATIONS  11/30/2016 Follow up Recommendations Skilled Nursing facility   [facility] IP FREQUENCY AND DURATION 11/30/2016 Speech Therapy Frequency (ACUTE ONLY) min 2x/week Treatment Duration 2 weeks      CHL IP ORAL PHASE 11/30/2016 Oral Phase WFL Oral - Pudding Teaspoon -- Oral - Pudding Cup -- Oral - Honey Teaspoon -- Oral - Honey Cup -- Oral - Nectar Teaspoon -- Oral - Nectar Cup --  Oral - Nectar Straw -- Oral - Thin Teaspoon -- Oral - Thin Cup -- Oral - Thin Straw -- Oral - Puree -- Oral - Mech Soft -- Oral - Regular -- Oral - Multi-Consistency -- Oral - Pill -- Oral Phase - Comment --  CHL IP PHARYNGEAL PHASE 11/30/2016 Pharyngeal Phase Impaired Pharyngeal- Pudding Teaspoon -- Pharyngeal -- Pharyngeal- Pudding Cup -- Pharyngeal -- Pharyngeal- Honey Teaspoon -- Pharyngeal -- Pharyngeal- Honey Cup -- Pharyngeal -- Pharyngeal- Nectar Teaspoon -- Pharyngeal -- Pharyngeal- Nectar Cup Reduced epiglottic inversion;Reduced anterior laryngeal mobility;Reduced airway/laryngeal closure;Reduced tongue base retraction;Delayed swallow initiation-pyriform sinuses;Pharyngeal residue - valleculae;Pharyngeal residue - pyriform;Compensatory strategies attempted (with notebox) Pharyngeal -- Pharyngeal- Nectar Straw Reduced epiglottic inversion;Reduced anterior laryngeal mobility;Reduced airway/laryngeal closure;Reduced tongue base retraction;Delayed swallow initiation-pyriform sinuses;Pharyngeal residue - valleculae;Pharyngeal residue - pyriform;Compensatory strategies attempted (with notebox);Other (Comment) Pharyngeal -- Pharyngeal- Thin Teaspoon -- Pharyngeal -- Pharyngeal- Thin Cup Reduced epiglottic inversion;Reduced anterior laryngeal mobility;Reduced airway/laryngeal closure;Reduced tongue base retraction;Delayed swallow initiation-pyriform sinuses;Pharyngeal residue - valleculae;Pharyngeal residue - pyriform;Penetration/Apiration after swallow;Significant aspiration (Amount) Pharyngeal Material enters airway, passes BELOW cords and not ejected out  despite cough attempt by patient Pharyngeal- Thin Straw Reduced epiglottic inversion;Reduced anterior laryngeal mobility;Reduced airway/laryngeal closure;Reduced tongue base retraction;Delayed swallow initiation-pyriform sinuses;Pharyngeal residue - valleculae;Pharyngeal residue - pyriform;Compensatory strategies attempted (with notebox);Penetration/Aspiration during swallow Pharyngeal Material enters airway, passes BELOW cords without attempt by patient to eject out (silent aspiration) Pharyngeal- Puree Reduced epiglottic inversion;Reduced anterior laryngeal mobility;Reduced airway/laryngeal closure;Reduced tongue base retraction;Pharyngeal residue - valleculae;Pharyngeal residue - pyriform;Compensatory strategies attempted (with notebox);Delayed swallow initiation-vallecula Pharyngeal -- Pharyngeal- Mechanical Soft Reduced epiglottic inversion;Reduced anterior laryngeal mobility;Reduced airway/laryngeal closure;Reduced tongue base retraction;Pharyngeal residue - valleculae;Pharyngeal residue - pyriform;Compensatory strategies attempted (with notebox);Delayed swallow initiation-vallecula Pharyngeal -- Pharyngeal- Regular -- Pharyngeal -- Pharyngeal- Multi-consistency -- Pharyngeal -- Pharyngeal- Pill Reduced epiglottic inversion;Reduced anterior laryngeal mobility;Reduced airway/laryngeal closure;Reduced tongue base retraction;Pharyngeal residue - valleculae;Pharyngeal residue - pyriform;Compensatory strategies attempted (with notebox);Delayed swallow initiation-vallecula Pharyngeal -- Pharyngeal Comment --  No flowsheet data found. CHL IP GO 11/29/2016 Functional Assessment Tool Used clinical judgement Functional Limitations Swallowing Swallow Current Status [phone]) CJ Swallow Goal Status (U0454) CJ Swallow Discharge Status (U9811) (None) Motor Speech Current Status [phone]) (None) Motor Speech Goal Status (G9562) (None) Motor Speech Goal Status (Z3086) (None) Spoken Language Comprehension Current Status (V7846)  (None) Spoken Language Comprehension Goal Status (N6295) (None) Spoken Language Comprehension Discharge Status [phone]) (None) Spoken Language Expression Current Status [phone]) (None) Spoken Language Expression Goal Status [phone]) (None) Spoken Language Expression Discharge Status [phone]) (None) Attention Current Status (I3474) (None) Attention Goal Status (Q5956) (None) Attention Discharge Status [phone]) (None) Memory Current Status (E3329) (None) Memory Goal Status (J1884) (None) Memory Discharge Status (Z6606) (None) Voice Current Status (T0160) (None) Voice Goal Status (F0932) (None) Voice Discharge Status (T5573) (None) Other Speech-Language Pathology Functional Limitation [phone]) (None) Other Speech-Language Pathology Functional Limitation Goal Status (K2706) (None) Other Speech-Language Pathology Functional Limitation Discharge Status [phone]) (None) Harlon Ditty, MA CCC-SLP [phone] [place], Riley Nearing 11/30/2016, 2:50 PM              Dg Femur Min 2 Views Left  Result Date: 11/27/2016 CLINICAL DATA:  Larey Seat while attempting to get out of bed unassisted today. EXAM: LEFT FEMUR 2 VIEWS COMPARISON:  None. FINDINGS: There is a subcapital left hip fracture. Remainder of the femur is intact. No dislocation. No bone lesion or bony destruction. IMPRESSION: Subcapital left hip fracture Electronically Signed   By: Ellery Plunk M.D.   On: 11/27/2016 01:13     LOS: 14  days   Leroy Sea, MD  [facility] Pager:[phone]  If 7PM-7AM, please contact night-coverage [email or web address] Password [facility] 12/11/2016, 11:36 AM

## 2016-12-11 NOTE — Progress Notes (Signed)
ANTICOAGULATION CONSULT NOTE - Initial Consult  Pharmacy Consult for heparin Indication: mural thrombus with history of stroke  Allergies  Allergen Reactions  . Codeine Anaphylaxis and Nausea And Vomiting  . Hydrocodone Other (See Comments)    On MAR  . Hydrocodone-Acetaminophen Nausea And Vomiting  . Tegaderm Ag Mesh [Silver] Other (See Comments)    On MAR  . Lisinopril Itching and Rash  . Tylenol [Acetaminophen] Nausea Only    Patient Measurements: Height: 6\' 2"  (188 cm) Weight: 252 lb 8 oz (114.5 kg) IBW/kg (Calculated) : 82.2 Heparin Dosing Weight: 95kg  Vital Signs: Temp: 99.4 F (37.4 C) (02/18 1217) Temp Source: Axillary (02/18 1217) BP: 105/71 (02/18 1217) Pulse Rate: 80 (02/18 1217)  Labs:  Recent Labs  12/09/16 0329 12/10/16 0415 12/11/16 0257  HGB 14.0 13.4 14.3  HCT 43.1 41.9 43.9  PLT 199 175 167  CREATININE 2.79* 3.06* 2.77*    Estimated Creatinine Clearance: 37.7 mL/min (by C-G formula based on SCr of 2.77 mg/dL (H)).   Medical History: Past Medical History:  Diagnosis Date  . Acute respiratory failure (HCC)    a. Spring 2017 following fall/splenectomy -->admitted to [facility]-->trach/g tube.  . Anemia    a. 12/2015 ABL in setting of fall/hematomas/splenic laceration req splenectomy.  Marland Kitchen Apical mural thrombus    a. 11/2015 Echo: EF 25-30%, moderate size apical thrombus-->coumadin;  b. 12/2015 f/u Echo: EF 25-30%, ant/septa HK, no obvious large thrombus but cannot exclude small mural thrombus.  Marland Kitchen CAD (coronary artery disease)    a. 12/2014 s/p BMS to the RCA.  Marland Kitchen Cerebral infarction (HCC)   . Chronic systolic CHF (congestive heart failure) (HCC)    a. 12/2015 Echo: EF 25-30%.  . Dementia   . Depression   . Dysphagia   . Falls    a. 11/2015 traumatic fall with resultant trauma req splenectomy and prolonged hospitalization complicated by resp failure.  Marland Kitchen History of pneumonia   . Hyperlipidemia   . Hypertension   . Ischemic  cardiomyopathy    a. 12/2015 Echo: EF 25-30%, anterior and septal HK.  Marland Kitchen Left hemiplegia (HCC)   . Protein calorie malnutrition (HCC)   . Respiratory failure (HCC)   . Spleen injury   . Status post tracheostomy (HCC)    a. 01/2016 in setting of ongoing resp failure and aspiration.   Assessment: 61 y.o. male with history chronic systolic heart failure (EF 25-30%), history of LV thrombus, prior CVA with chronic left-sided hemiplegia-multiple falls over the past few years causing splenic rupture-presented to the hospital on 2/3 following a mechanical fall, found to have a left hip fracture.  Patient previously on warfarin which we are holding at this time, family prefers to continue aggressive care including anticoagulation so will start IV heparin today. Last INR was 1.1 on 2/13. SQ heparin was stopped on 2/16.   Goal of Therapy:  Heparin level 0.3-0.7 units/ml Monitor platelets by anticoagulation protocol: Yes   Plan:  Give 4000 units bolus x 1 Start heparin infusion at 1300 units/hr Check anti-Xa level in 8 hours and daily while on heparin Continue to monitor H&H and platelets  Sheppard Coil PharmD., BCPS Clinical Pharmacist Pager [phone] 12/11/2016 1:20 PM

## 2016-12-11 NOTE — Progress Notes (Addendum)
No charge note  90 minute family meeting held with patient's Darius Bump, sister Israel.  Family understands the patient's time is limited (Month's?).  They do not want him to suffer.  A MOST form was completed - DNR with Full Scope Treatment, including the use of BiPAP if indicated.     Specifically - No intubation, No defibrillation, No CPR, No feeding tube.     Yes to IVF, Antibiotics, Bipap.  Hospice care was discussed as a future option.   [employer] were presented.  The family greatly appreciates the current medical team's  [facility] & CCM) efforts to optimize Greg's respiratory and cardiac status prior to discharge.  They are hopeful that he may improve enough to have his hip repaired, but they understand that at this point that is unlikely to happen.  He will be bed bound.  If possible he would like to return to [facility].  The family was also pleased with the care he received at [facility] last spring - they would likely accept that as a disposition option if needed.   Algis Downs, [place] [facility] Pager: [phone]

## 2016-12-11 NOTE — Progress Notes (Addendum)
Daily Progress Note   Patient Name: Curtis Keller       Date: 12/11/2016 DOB: 23-Jun-1956  Age: 61 y.o. MRN#: [id number] Attending Physician: Leroy Sea, MD Primary Care Physician: Kirt Boys, DO Admit Date: 11/26/2016  Reason for Consultation/Follow-up: Establishing goals of care given left hip fracture, respiratory failure, CHF, aspiration pneumonia  Subjective: I spent time at bedside feeding Curtis Keller ice chips.  He is coherent this morning.  He has two requests that he is insistent about (1) he wants non-thickened ice water, (2) he wants to get out of bed.  We discussed choking, aspiration, strangling and recurrent pneumonia.  He tells me he understands and wants water.  I attempted to elicit Curtis Keller' goals/values.  At this point he says "I want to keep going a little while longer", but quality of life is becoming more important than quantity.  He wants simple pleasures such as ice water.  I then called his daughter Curtis Keller.  Curtis Keller is very supportive of the medical team's recommendations - including DNR.  Her sister Curtis Keller and her Curtis Keller feel changing his code status to DNR will cause the medical community to dismiss Curtis Keller and "give up on him".  I discussed giving Curtis Keller ice water with Curtis Keller.  She was very resolute that if her father is awake and alert that the family gives permission for him to have sips of ice water.  She clearly understands the high risk of aspiration pneumonia but in order to give their father some quality of life - Curtis Keller relays that both she and Curtis Keller give permission for their father to have sips of non-thickened ice water.  Curtis Keller, and HCPOA - Curtis Keller will be at the hospital this afternoon.   They will ask for me to be paged when they  arrive so that we may continue the discussion of goals of care and his disease trajectory.  I put a call into [facility] to consider whether or not it would be safe to get him up to chair.  The orthopedic PA returned my call.  We discussed the fact that there is no opportunity for surgery unless he improves significantly.  Despite this, the recommendation is still for him to remain non-weight bearing in order to avoid displacement and severe pain.  Orthopedics will  likely round on him again 2/19.   Patient Profile/HPI: 61 y.o. male  with past medical history ofHTN, systolic CHF EF 25-30%,CAD, DM type II, AAA, COPD, chronic respiratory failure on 2 L of NCoxygen at night, CVA with residual left-sided hemiparesis/dysphagia, apical thrombus on Coumadin, splenectomy s/p fall with complications resulting in tracheostomy x 6 weeks - - who was admitted on 11/26/2016 with left hip fracture s/p fall at SNF. Awaiting hip repair d/t concern for respiratory status aspiration pneumonia and heart failure exacerbation. High risk for post-op delirium.   Length of Stay: 14  Current Medications: Scheduled Meds:  . aspirin EC  81 mg Oral Daily  . atorvastatin  40 mg Oral QHS  . busPIRone  10 mg Oral BID  . dextromethorphan-guaiFENesin  1 tablet Oral BID  . divalproex  125 mg Oral TID  . famotidine  20 mg Oral BID  . FLUoxetine  60 mg Oral Daily  . hydrALAZINE  12.5 mg Oral q12n4p  . insulin aspart  0-9 Units Subcutaneous TID WC  . ipratropium-albuterol  3 mL Nebulization Q6H  . lidocaine  1 patch Transdermal Q24H  . mouth rinse  15 mL Mouth Rinse BID  . meropenem (MERREM) IV  1 g Intravenous Q12H  . metoprolol succinate  25 mg Oral Daily  . polyethylene glycol  17 g Oral Daily  . potassium chloride  20 mEq Oral Daily  . pregabalin  75 mg Oral Daily  . senna-docusate  1 tablet Oral BID  . [START ON 12/12/2016] vancomycin  1,250 mg Intravenous Q24H    Continuous Infusions: . sodium chloride  10 mL/hr at 12/11/16 0700    PRN Meds: acetaminophen, haloperidol lactate, methocarbamol, oxyCODONE, RESOURCE THICKENUP CLEAR  Physical Exam        Wd male, with dry mouth and lips.  Awake, alert, able to tell me the year, the president, and where he is. CV rrr Resp no distress - on high flow at 8 liters.   Vital Signs: BP 139/85 (BP Location: Left Arm)   Pulse 94   Temp (!) 100.6 F (38.1 C) (Oral)   Resp (!) 23   Ht 6\' 2"  (1.88 m)   Wt 114.5 kg (252 lb 8 oz)   SpO2 92%   BMI 32.42 kg/m  SpO2: SpO2: 92 % O2 Device: O2 Device: High Flow Nasal Cannula O2 Flow Rate: O2 Flow Rate (L/min): 7 L/min  Intake/output summary:  Intake/Output Summary (Last 24 hours) at 12/11/16 1029 Last data filed at 12/11/16 0815  Gross per 24 hour  Intake             1350 ml  Output             1700 ml  Net             -350 ml   LBM: Last BM Date: 12/07/16 Baseline Weight: Weight: 102.1 kg (225 lb) Most recent weight: Weight: 114.5 kg (252 lb 8 oz)       Palliative Assessment/Data:    Flowsheet Rows   Flowsheet Row Most Recent Value  Intake Tab  Referral Department  Hospitalist  Unit at Time of Referral  ICU  Palliative Care Primary Diagnosis  Cardiac  Date Notified  11/30/16  Palliative Care Type  New Palliative care  Reason for referral  Clarify Goals of Care  Date of Admission  11/26/16  Date first seen by Palliative Care  11/30/16  # of days Palliative referral response time  0 Day(s)  #  of days IP prior to Palliative referral  4  Clinical Assessment  Psychosocial & Spiritual Assessment  Palliative Care Outcomes      Patient Active Problem List   Diagnosis Date Noted  . DNR (do not resuscitate)   . Ischemic cardiomyopathy   . Goals of care, counseling/discussion   . Palliative care encounter   . Acute systolic congestive heart failure (HCC)   . Acute and chronic respiratory failure with hypoxia (HCC) 11/28/2016  . Hip fracture (HCC) 11/27/2016  . Closed nondisplaced  intertrochanteric fracture of left femur (HCC) 11/27/2016  . Chronic respiratory failure (HCC) 11/27/2016  . Fall at nursing home 07/17/2016  . Diabetes mellitus, type II, insulin dependent (HCC) 06/12/2016  . GERD (gastroesophageal reflux disease) 05/07/2016  . COPD (chronic obstructive pulmonary disease) (HCC) 04/18/2016  . Chronic anticoagulation 03/22/2016  . S/P splenectomy 03/13/2016  . PEG (percutaneous endoscopic gastrostomy) status (HCC) 03/13/2016  . MRSA (methicillin resistant Staphylococcus aureus) septicemia (HCC) 03/13/2016  . Aspiration pneumonia (HCC) 03/13/2016  . Dysphagia 03/13/2016  . Acute blood loss anemia 03/13/2016  . Hyperlipidemia 03/13/2016  . Heart failure, chronic systolic (HCC)   . Hypertension   . Depression   . Status post tracheostomy (HCC)   . Cardiomyopathy (HCC)   . Apical mural thrombus   . Protein calorie malnutrition (HCC)   . Intraparenchymal hematoma of brain due to trauma [facility])     Palliative Care Plan    Recommendations/Plan:  When patient is awake and alert he may have sips and chips - family accepts the risk of aspiration  Continue non-weight bearing status.  Ortho will likely round again 2/19.  TRH/CCM actively trying to improve respiratory/cardiac/renal function.  Patient appears to have underlying infection - likely aspiration pneumonia  PMT will continue to meet with family providing education and attempting to refine GOC  Goals of Care and Additional Recommendations:  Limitations on Scope of Treatment: Full Scope Treatment  Code Status:  Full code  Prognosis:   < 6 months - depending on GOC - given kidney failure, recurrent aspiration, heart failure and bedbound status.  Discharge Planning:  SNF with Palliative Care - Please put Palliative Care to follow at SNF in D/C Summary.  Care plan was discussed with [facility] and Bedside RNs.  Thank you for allowing the Palliative Medicine Team to assist in the care of this  patient.   Time In:  8:00   Time out:  9:00  Time In:  2:10    Time out:  3:48  (please see subsequent "no charge" note)   Total time spent:  150 min.     Greater than 50%  of this time was spent counseling and coordinating care related to the above assessment and plan.  Algis Downs, PA-C Palliative Medicine  Please contact Palliative MedicineTeam phone at [phone] for questions and concerns between 7 am - 7 pm.   Please see AMION for individual provider pager numbers.

## 2016-12-11 NOTE — Progress Notes (Signed)
PHARMACY - PHYSICIAN COMMUNICATION CRITICAL VALUE ALERT - BLOOD CULTURE IDENTIFICATION (BCID)  Results for orders placed or performed during the hospital encounter of 11/26/16  Blood Culture ID Panel (Reflexed) (Collected: 12/10/2016  3:38 PM)  Result Value Ref Range   Enterococcus species NOT DETECTED NOT DETECTED   Listeria monocytogenes NOT DETECTED NOT DETECTED   Staphylococcus species DETECTED (A) NOT DETECTED   Staphylococcus aureus NOT DETECTED NOT DETECTED   Methicillin resistance DETECTED (A) NOT DETECTED   Streptococcus species NOT DETECTED NOT DETECTED   Streptococcus agalactiae NOT DETECTED NOT DETECTED   Streptococcus pneumoniae NOT DETECTED NOT DETECTED   Streptococcus pyogenes NOT DETECTED NOT DETECTED   Acinetobacter baumannii NOT DETECTED NOT DETECTED   Enterobacteriaceae species NOT DETECTED NOT DETECTED   Enterobacter cloacae complex NOT DETECTED NOT DETECTED   Escherichia coli NOT DETECTED NOT DETECTED   Klebsiella oxytoca NOT DETECTED NOT DETECTED   Klebsiella pneumoniae NOT DETECTED NOT DETECTED   Proteus species NOT DETECTED NOT DETECTED   Serratia marcescens NOT DETECTED NOT DETECTED   Haemophilus influenzae NOT DETECTED NOT DETECTED   Neisseria meningitidis NOT DETECTED NOT DETECTED   Pseudomonas aeruginosa NOT DETECTED NOT DETECTED   Candida albicans NOT DETECTED NOT DETECTED   Candida glabrata NOT DETECTED NOT DETECTED   Candida krusei NOT DETECTED NOT DETECTED   Candida parapsilosis NOT DETECTED NOT DETECTED   Candida tropicalis NOT DETECTED NOT DETECTED    Name of physician (or Provider) Contacted: Dr. Thedore Mins  Changes to prescribed antibiotics required: Patient with 1/2 blood cultures with staph species (mec A detected), possible contaminant. Previous blood cultures from 2/4 are no growth final. Patient is currently on vancomycin and meropenem.   Casilda Carls, PharmD, BCPS PGY-2 Infectious Diseases Pharmacy Resident Pager: [phone] 12/11/2016  4:32  PM

## 2016-12-11 NOTE — Progress Notes (Signed)
ANTICOAGULATION CONSULT NOTE - f/u Consult  Pharmacy Consult for heparin Indication: mural thrombus with history of stroke  Allergies  Allergen Reactions  . Codeine Anaphylaxis and Nausea And Vomiting  . Hydrocodone Other (See Comments)    On MAR  . Hydrocodone-Acetaminophen Nausea And Vomiting  . Tegaderm Ag Mesh [Silver] Other (See Comments)    On MAR  . Lisinopril Itching and Rash  . Tylenol [Acetaminophen] Nausea Only    Patient Measurements: Height: 6\' 2"  (188 cm) Weight: 252 lb 8 oz (114.5 kg) IBW/kg (Calculated) : 82.2 Heparin Dosing Weight: 95kg  Vital Signs: Temp: 98.4 F (36.9 C) (02/18 1900) Temp Source: Oral (02/18 1900) BP: 116/78 (02/18 2000) Pulse Rate: 72 (02/18 2000)  Labs:  Recent Labs  12/09/16 0329 12/10/16 0415 12/11/16 0257 12/11/16 2148  HGB 14.0 13.4 14.3  --   HCT 43.1 41.9 43.9  --   PLT 199 175 167  --   HEPARINUNFRC  --   --   --  0.10*  CREATININE 2.79* 3.06* 2.77*  --     Estimated Creatinine Clearance: 37.7 mL/min (by C-G formula based on SCr of 2.77 mg/dL (H)).   Medical History: Past Medical History:  Diagnosis Date  . Acute respiratory failure (HCC)    a. Spring 2017 following fall/splenectomy -->admitted to [facility]-->trach/g tube.  . Anemia    a. 12/2015 ABL in setting of fall/hematomas/splenic laceration req splenectomy.  Marland Kitchen Apical mural thrombus    a. 11/2015 Echo: EF 25-30%, moderate size apical thrombus-->coumadin;  b. 12/2015 f/u Echo: EF 25-30%, ant/septa HK, no obvious large thrombus but cannot exclude small mural thrombus.  Marland Kitchen CAD (coronary artery disease)    a. 12/2014 s/p BMS to the RCA.  Marland Kitchen Cerebral infarction (HCC)   . Chronic systolic CHF (congestive heart failure) (HCC)    a. 12/2015 Echo: EF 25-30%.  . Dementia   . Depression   . Dysphagia   . Falls    a. 11/2015 traumatic fall with resultant trauma req splenectomy and prolonged hospitalization complicated by resp failure.  Marland Kitchen History of pneumonia    . Hyperlipidemia   . Hypertension   . Ischemic cardiomyopathy    a. 12/2015 Echo: EF 25-30%, anterior and septal HK.  Marland Kitchen Left hemiplegia (HCC)   . Protein calorie malnutrition (HCC)   . Respiratory failure (HCC)   . Spleen injury   . Status post tracheostomy (HCC)    a. 01/2016 in setting of ongoing resp failure and aspiration.   Assessment: 61 y.o. male with history chronic systolic heart failure (EF 25-30%), history of LV thrombus, prior CVA with chronic left-sided hemiplegia-multiple falls over the past few years causing splenic rupture-presented to the hospital on 2/3 following a mechanical fall, found to have a left hip fracture.  Patient previously on warfarin which we are holding at this time, family prefers to continue aggressive care including anticoagulation so will start IV heparin today. HL 0.1  Goal of Therapy:  Heparin level 0.3-0.7 units/ml Monitor platelets by anticoagulation protocol: Yes   Plan:  Heparin 2500 unit IV bolus Increase infusion to 1500 units/hr Next HL in AM  Curtis Keller S. Merilynn Finland, PharmD, [facility] Clinical Staff Pharmacist Pager [phone]  12/11/2016 10:26 PM

## 2016-12-12 LAB — GLUCOSE, CAPILLARY
GLUCOSE-CAPILLARY: 223 mg/dL — AB (ref 65–99)
Glucose-Capillary: 151 mg/dL — ABNORMAL HIGH (ref 65–99)
Glucose-Capillary: 235 mg/dL — ABNORMAL HIGH (ref 65–99)
Glucose-Capillary: 224 mg/dL — ABNORMAL HIGH (ref 65–99)

## 2016-12-12 LAB — CBC
HEMATOCRIT: 38.7 % — AB (ref 39.0–52.0)
HEMOGLOBIN: 12.8 g/dL — AB (ref 13.0–17.0)
MCH: 33.3 pg (ref 26.0–34.0)
MCHC: 33.1 g/dL (ref 30.0–36.0)
MCV: 100.8 fL — AB (ref 78.0–100.0)
Platelets: 161 10*3/uL (ref 150–400)
RBC: 3.84 MIL/uL — AB (ref 4.22–5.81)
RDW: 15.7 % — ABNORMAL HIGH (ref 11.5–15.5)
WBC: 15.4 10*3/uL — AB (ref 4.0–10.5)

## 2016-12-12 LAB — BLOOD CULTURE ID PANEL (REFLEXED)
Acinetobacter baumannii: NOT DETECTED
CANDIDA KRUSEI: NOT DETECTED
CANDIDA PARAPSILOSIS: NOT DETECTED
CANDIDA TROPICALIS: NOT DETECTED
Candida albicans: NOT DETECTED
Candida glabrata: NOT DETECTED
ESCHERICHIA COLI: NOT DETECTED
Enterobacter cloacae complex: NOT DETECTED
Enterobacteriaceae species: NOT DETECTED
Enterococcus species: NOT DETECTED
Haemophilus influenzae: NOT DETECTED
KLEBSIELLA PNEUMONIAE: NOT DETECTED
Klebsiella oxytoca: NOT DETECTED
LISTERIA MONOCYTOGENES: NOT DETECTED
Methicillin resistance: DETECTED — AB
Neisseria meningitidis: NOT DETECTED
PROTEUS SPECIES: NOT DETECTED
Pseudomonas aeruginosa: NOT DETECTED
SERRATIA MARCESCENS: NOT DETECTED
STAPHYLOCOCCUS AUREUS BCID: NOT DETECTED
STAPHYLOCOCCUS SPECIES: DETECTED — AB
STREPTOCOCCUS AGALACTIAE: NOT DETECTED
Streptococcus pneumoniae: NOT DETECTED
Streptococcus pyogenes: NOT DETECTED
Streptococcus species: NOT DETECTED

## 2016-12-12 LAB — PROCALCITONIN: PROCALCITONIN: 0.99 ng/mL

## 2016-12-12 LAB — HEPARIN LEVEL (UNFRACTIONATED)
HEPARIN UNFRACTIONATED: 0.2 [IU]/mL — AB (ref 0.30–0.70)
HEPARIN UNFRACTIONATED: 0.1 [IU]/mL — AB (ref 0.30–0.70)
HEPARIN UNFRACTIONATED: 0.33 [IU]/mL (ref 0.30–0.70)
Heparin Unfractionated: 0.22 IU/mL — ABNORMAL LOW (ref 0.30–0.70)

## 2016-12-12 LAB — CULTURE, BLOOD (ROUTINE X 2)

## 2016-12-12 MED ORDER — POLYETHYLENE GLYCOL 3350 17 G PO PACK
17.0000 g | PACK | Freq: Two times a day (BID) | ORAL | Status: DC
  Administered 2016-12-13: 17 g via ORAL
  Filled 2016-12-12 (×2): qty 1

## 2016-12-12 MED ORDER — WARFARIN - PHARMACIST DOSING INPATIENT
Freq: Every day | Status: DC
  Administered 2016-12-12: 18:00:00

## 2016-12-12 MED ORDER — BISACODYL 10 MG RE SUPP
10.0000 mg | Freq: Two times a day (BID) | RECTAL | Status: DC
  Administered 2016-12-12 – 2016-12-13 (×3): 10 mg via RECTAL
  Filled 2016-12-12 (×3): qty 1

## 2016-12-12 MED ORDER — WARFARIN SODIUM 5 MG PO TABS
5.0000 mg | ORAL_TABLET | Freq: Once | ORAL | Status: AC
  Filled 2016-12-12: qty 1

## 2016-12-12 MED ORDER — WARFARIN SODIUM 5 MG PO TABS
5.0000 mg | ORAL_TABLET | Freq: Once | ORAL | Status: AC
  Administered 2016-12-12: 5 mg via ORAL

## 2016-12-12 MED ORDER — HEPARIN BOLUS VIA INFUSION
2000.0000 [IU] | Freq: Once | INTRAVENOUS | Status: AC
  Administered 2016-12-12: 2000 [IU] via INTRAVENOUS
  Filled 2016-12-12: qty 2000

## 2016-12-12 MED ORDER — MAGNESIUM HYDROXIDE 400 MG/5ML PO SUSP
30.0000 mL | Freq: Two times a day (BID) | ORAL | Status: AC
  Administered 2016-12-12 (×2): 30 mL via ORAL
  Filled 2016-12-12 (×2): qty 30

## 2016-12-12 NOTE — Care Management Important Message (Signed)
Important Message  Patient Details  Name: Curtis Keller MRN: [id number] Date of Birth: 1956-10-17   Medicare Important Message Given:  Yes    Hanley Hays, RN 12/12/2016, 10:57 AM

## 2016-12-12 NOTE — Progress Notes (Signed)
ANTICOAGULATION CONSULT NOTE - f/u Consult  Pharmacy Consult for heparin Indication: mural thrombus with history of stroke  Allergies  Allergen Reactions  . Codeine Anaphylaxis and Nausea And Vomiting  . Hydrocodone Other (See Comments)    On MAR  . Hydrocodone-Acetaminophen Nausea And Vomiting  . Tegaderm Ag Mesh [Silver] Other (See Comments)    On MAR  . Lisinopril Itching and Rash  . Tylenol [Acetaminophen] Nausea Only    Patient Measurements: Height: 6\' 2"  (188 cm) Weight: 252 lb 8 oz (114.5 kg) IBW/kg (Calculated) : 82.2 Heparin Dosing Weight: 95kg  Vital Signs: Temp: 100.5 F (38.1 C) (02/18 2348) Temp Source: Oral (02/18 2348) BP: 125/73 (02/19 0000) Pulse Rate: 85 (02/19 0000)  Labs:  Recent Labs  12/09/16 0329 12/10/16 0415 12/11/16 0257 12/11/16 2148 12/12/16 0135  HGB 14.0 13.4 14.3  --  12.8*  HCT 43.1 41.9 43.9  --  38.7*  PLT 199 175 167  --  161  HEPARINUNFRC  --   --   --  0.10* 0.22*  CREATININE 2.79* 3.06* 2.77*  --   --     Estimated Creatinine Clearance: 37.7 mL/min (by C-G formula based on SCr of 2.77 mg/dL (H)).   Medical History: Past Medical History:  Diagnosis Date  . Acute respiratory failure (HCC)    a. Spring 2017 following fall/splenectomy -->admitted to [facility]-->trach/g tube.  . Anemia    a. 12/2015 ABL in setting of fall/hematomas/splenic laceration req splenectomy.  Marland Kitchen Apical mural thrombus    a. 11/2015 Echo: EF 25-30%, moderate size apical thrombus-->coumadin;  b. 12/2015 f/u Echo: EF 25-30%, ant/septa HK, no obvious large thrombus but cannot exclude small mural thrombus.  Marland Kitchen CAD (coronary artery disease)    a. 12/2014 s/p BMS to the RCA.  Marland Kitchen Cerebral infarction (HCC)   . Chronic systolic CHF (congestive heart failure) (HCC)    a. 12/2015 Echo: EF 25-30%.  . Dementia   . Depression   . Dysphagia   . Falls    a. 11/2015 traumatic fall with resultant trauma req splenectomy and prolonged hospitalization  complicated by resp failure.  Marland Kitchen History of pneumonia   . Hyperlipidemia   . Hypertension   . Ischemic cardiomyopathy    a. 12/2015 Echo: EF 25-30%, anterior and septal HK.  Marland Kitchen Left hemiplegia (HCC)   . Protein calorie malnutrition (HCC)   . Respiratory failure (HCC)   . Spleen injury   . Status post tracheostomy (HCC)    a. 01/2016 in setting of ongoing resp failure and aspiration.   Assessment: 61 y.o. male with history chronic systolic heart failure (EF 25-30%), history of LV thrombus, prior CVA with chronic left-sided hemiplegia-multiple falls over the past few years causing splenic rupture-presented to the hospital on 2/3 following a mechanical fall, found to have a left hip fracture.  Patient previously on warfarin which we are holding at this time, family prefers to continue aggressive care including anticoagulation so will start IV heparin today. HL 0.1>0.22 (only 3 hrs)  Goal of Therapy:  Heparin level 0.3-0.7 units/ml Monitor platelets by anticoagulation protocol: Yes   Plan:  Continue nfusion to 1500 units/hr Next heparin level 0500  Jream Broyles S. Merilynn Finland, PharmD, [facility] Clinical Staff Pharmacist Pager [phone]  12/12/2016 2:32 AM

## 2016-12-12 NOTE — Progress Notes (Signed)
ANTICOAGULATION CONSULT NOTE - f/u Consult  Pharmacy Consult for heparin, coumadin  Indication: mural thrombus with history of stroke  Allergies  Allergen Reactions  . Codeine Anaphylaxis and Nausea And Vomiting  . Hydrocodone Other (See Comments)    On MAR  . Hydrocodone-Acetaminophen Nausea And Vomiting  . Tegaderm Ag Mesh [Silver] Other (See Comments)    On MAR  . Lisinopril Itching and Rash  . Tylenol [Acetaminophen] Nausea Only    Patient Measurements: Height: 6\' 2"  (188 cm) Weight: 250 lb 1.6 oz (113.4 kg) IBW/kg (Calculated) : 82.2 Heparin Dosing Weight: 95kg  Vital Signs: Temp: 99.4 F (37.4 C) (02/19 2031) Temp Source: Oral (02/19 2031) BP: 113/62 (02/19 2031) Pulse Rate: 81 (02/19 2054)  Labs:  Recent Labs  12/10/16 0415 12/11/16 0257  12/12/16 0135 12/12/16 0524 12/12/16 1239 12/12/16 2237  HGB 13.4 14.3  --  12.8*  --   --   --   HCT 41.9 43.9  --  38.7*  --   --   --   PLT 175 167  --  161  --   --   --   HEPARINUNFRC  --   --   < > 0.22* 0.20* 0.10* 0.33  CREATININE 3.06* 2.77*  --   --   --   --   --   < > = values in this interval not displayed.  Estimated Creatinine Clearance: 37.5 mL/min (by C-G formula based on SCr of 2.77 mg/dL (H)).  Assessment: 61 y.o. male with history chronic systolic heart failure (EF 25-30%), history of LV thrombus (on coumadin PTA), prior CVA with chronic left-sided hemiplegia-multiple falls over the past few years causing splenic rupture-presented to the hospital on 2/3 following a mechanical fall, found to have a left hip fracture. Family prefers to continue aggressive care including anticoagulation so IV heparin was restarted on 2/18. Warfarin restarted 2/19.  Heparin level therapeutic (0.33) on gtt at 1950 units/hr. No bleeding noted.  Coumadin dose PTA: 3.5mg /d  Goal of Therapy:  Heparin level 0.3-0.7 units/ml Monitor platelets by anticoagulation protocol: Yes   Plan:  -Continue heparin 1950  units/hr -Heparin level and CBC daily   Christoper Fabian, PharmD, BCPS Clinical pharmacist, pager [phone]  12/12/2016 11:10 PM

## 2016-12-12 NOTE — Progress Notes (Signed)
amion                        PROGRESS NOTE        PATIENT DETAILS Name: Curtis Keller Age: 61 y.o. Sex: male Date of Birth: 04/16/1956 Admit Date: 11/26/2016 Admitting Physician Clydie Braun, MD JXB:JYNWGN, Maxine Glenn, DO  Brief Narrative: Patient is a 61 y.o. male with history chronic systolic heart failure (EF 25-30%), history of LV thrombus, prior CVA with chronic left-sided hemiplegia-multiple falls over the past few years causing splenic rupture-presented to the hospital on 2/3 following a mechanical fall, found to have a left hip fracture. Unfortunately hospital course has been complicated by development of acute hypoxic respiratory failure, felt due to aspiration PNA vs acute systolic CHF, worsening renal function and hyponatremia. After extensive discussion with patient, family-he is now a DO NOT RESUSCITATE. Palliative care continues to follow and engage with family, however on 2/17, family has decided to revoke the DO NOT RESUSCITATE.  Subjective: On 10 L high flow oxygen-continues to have pain in his left hip. Febrile last night.  Assessment/Plan:  Closed Left hip fracture: Secondary to a mechanical fall-unfortunately before hip repair could be attempted-patient developed hypoxic resp failure due to aspiration pneumonia and acute systolic heart failure. Seen by orthopedics, currently medical management per orthopedics due to his medical issues and very high risk for adverse cardiopulmonary outcome during surgery.   Acute hypoxemic respiratory failure secondary to aspiration pneumonia and acute on chronic systolic heart failure: Patient had a aspiration event on 2/5 requiring transfer to stepdown and 100% nonrebreather mask. He was subsequently treated with IV antibiotics and IV Lasix. He initially improved and his oxygen was titrated down to 3 L via nasal cannula. He completed a course of IV Zosyn. He unfortunately developed worsening respiratory failure (suspect he again  aspirated), and had to be restarted on intravenous antibiotics. He was also diuresed with IV Lasix. Currently still on high flow oxygen but clinically marginally improved. Remains very high risk for future aspiration.  Notes sister claims that she had signed a waiver at the nursing facility and was feeding the patient at well assuming responsibility for aspiration, she has been seen feeding the patient against nursing advice even at this facility. She has been adequately warned by previous physician Dr. Jerral Ralph.  For now continue supportive care, antibiotics, diuretics as needed, supportive care with oxygen never lies her treatments and pulmonary toiletry. He was also seen by pulmonary who deemed that he has a poor prognosis and they recommended against intubating this patient.  Family now agreeable for DO NOT RESUSCITATE except to do BiPAP if needed along with IV antibiotics and IV meds. They have accepted now that he has poor long-term prognosis.   Acute renal failure: Due to acute systolic CHF decompensation, initially diuresed, now appears euvolemic, monitor off Lasix.  Hypernatremia: Improving with Lasix being held.  Dysphagia: Speech therapy following, underwent modified barium swallow-this is a chronic issue-patient has a prior history of CVA-he previously had a tube in place. Recommendations are to try a dysphagia 3 solids with nectar thick liquids. Patient's sister giving him clear liquids despite being warned about the consequences, she claims that she had signed a waiver at the nursing facility and was doing that at the nursing facility also. This could be causing recurrent infection. She has been warned. Speech following, antibiotics as above, continue supportive care.  History of left apical thrombus: As on Coumadin, place on heparin drip,  remains at risk for falls and bleeding, but this risk has been present before, sister wants anticoagulation which will be continued, defer to PCP on  long-term anticoagulation.  Acute metabolic encephalopathy/dementia: Supportive care, remains at risk for delirium.  Leukocytosis: 22 pneumonia continue antibiotics and monitor.   Hx of COPD: not in exacerbation-see above.   History of CVA with chronic left sided weakness: Previously on anticoagulation-left-sided deficits are unchanged. Continue statin. See above regarding anticoagulation  GERD: Continue Pepcid  Type 2 diabetes: CBGs stable-continue SSI  History of anxiety/depression/mild dementia: Continue Depakote, fluoxetine and BuSpar.  Frequent falls: Has had frequent falls in the past few years-one episode of fall resulted in a splenic laceration requiring splenectomy. Unfortunately, patient also has a history of LV thrombus and a CVA with left hemiplegia. Previous M.D. had detailed discussions about risks and benefits with patient and sister who for no one to continue anticoagulation, he was on Coumadin prior to this admission, for now heparin drip + coumadin bridge.  Constipation. Placed on bowel regimen will monitor.   Palliative care: Overall prognosis is extremely poor due to recurrent aspiration, underlying left-sided hemiplegia and dysphagia, recurrent falls, mural thrombus, falls with history of anticoagulation, hypoxic respiratory failure. Family initially wanted to make patient DO NOT RESUSCITATE but was then changed to full code. Palliative care following prognosis remains poor, critical care also recommends against intubation and full code and they agreed that his prognosis is poor.   Detailed discussion with family on 12/11/2016, patient is now DO NOT RESUSCITATE except they still want gentle medications, IV antibiotics and BiPAP if needed.   DVT Prophylaxis:  Heparin   Code Status:  None except do BiPAP, IV medicines and IV antibiotics  Family Communication:  None bedside  Disposition Plan: Tele  Antimicrobial agents: Anti-infectives    Start     Dose/Rate  Route Frequency Ordered Stop   12/12/16 2200  vancomycin (VANCOCIN) 1,250 mg in sodium chloride 0.9 % 250 mL IVPB     1,250 mg 166.7 mL/hr over 90 Minutes Intravenous Every 24 hours 12/11/16 0934     12/10/16 1800  Ampicillin-Sulbactam (UNASYN) 3 g in sodium chloride 0.9 % 100 mL IVPB  Status:  Discontinued     3 g 200 mL/hr over 30 Minutes Intravenous Every 12 hours 12/10/16 1127 12/10/16 1508   12/10/16 1600  meropenem (MERREM) 1 g in sodium chloride 0.9 % 100 mL IVPB     1 g 200 mL/hr over 30 Minutes Intravenous Every 12 hours 12/10/16 1514     12/10/16 1600  vancomycin (VANCOCIN) IVPB 750 mg/150 ml premix  Status:  Discontinued     750 mg 150 mL/hr over 60 Minutes Intravenous Every 12 hours 12/10/16 1514 12/11/16 0934   12/06/16 1230  Ampicillin-Sulbactam (UNASYN) 3 g in sodium chloride 0.9 % 100 mL IVPB  Status:  Discontinued     3 g 200 mL/hr over 30 Minutes Intravenous Every 6 hours 12/06/16 1139 12/10/16 1127   11/28/16 1530  piperacillin-tazobactam (ZOSYN) IVPB 3.375 g     3.375 g 12.5 mL/hr over 240 Minutes Intravenous Every 8 hours 11/28/16 1518 12/04/16 2359   11/28/16 1530  vancomycin (VANCOCIN) IVPB 750 mg/150 ml premix  Status:  Discontinued     750 mg 150 mL/hr over 60 Minutes Intravenous Every 12 hours 11/28/16 1518 12/01/16 1031   11/28/16 1520  ceFAZolin (ANCEF) IVPB 2g/100 mL premix  Status:  Discontinued     2 g 200 mL/hr over 30 Minutes Intravenous  To [facility] 11/28/16 1247 11/28/16 2334   11/28/16 1000  piperacillin-tazobactam (ZOSYN) IVPB 3.375 g  Status:  Discontinued     3.375 g 12.5 mL/hr over 240 Minutes Intravenous Every 8 hours 11/28/16 0832 11/28/16 1518   11/28/16 1000  vancomycin (VANCOCIN) IVPB 750 mg/150 ml premix  Status:  Discontinued     750 mg 150 mL/hr over 60 Minutes Intravenous Every 12 hours 11/28/16 0832 11/28/16 1518      Procedures: Echo 2/5 - - Very limited image quality, however LVEF is severely impaired, estimated at 25-30%  with akinesis of all of the apical segments and mid anteroseptal, anterior and anterolateral segments. There is no thrombus on the contrast images. A cardiac MRI is recommended for further LVEF evaluation.   CONSULTS:  Palliative care, PCCM, Cardiology and Orthopedic surgery   Time spent: 32- minutes-Greater than 50% of this time was spent in counseling, explanation of diagnosis, planning of further management, and coordination of care.  MEDICATIONS: Scheduled Meds: . aspirin EC  81 mg Oral Daily  . atorvastatin  40 mg Oral QHS  . busPIRone  10 mg Oral BID  . dextromethorphan-guaiFENesin  1 tablet Oral BID  . divalproex  125 mg Oral TID  . famotidine  20 mg Oral BID  . FLUoxetine  60 mg Oral Daily  . hydrALAZINE  12.5 mg Oral q12n4p  . insulin aspart  0-9 Units Subcutaneous TID WC  . ipratropium-albuterol  3 mL Nebulization Q6H  . lidocaine  1 patch Transdermal Q24H  . mouth rinse  15 mL Mouth Rinse BID  . meropenem (MERREM) IV  1 g Intravenous Q12H  . metoprolol succinate  25 mg Oral Daily  . polyethylene glycol  17 g Oral Daily  . potassium chloride  20 mEq Oral Daily  . pregabalin  75 mg Oral Daily  . senna-docusate  1 tablet Oral BID  . vancomycin  1,250 mg Intravenous Q24H  . warfarin  5 mg Oral ONCE-1800  . Warfarin - Pharmacist Dosing Inpatient   Does not apply q1800   Continuous Infusions: . sodium chloride 10 mL/hr at 12/11/16 0700  . heparin 1,700 Units/hr (12/12/16 0626)   PRN Meds:.acetaminophen, haloperidol lactate, methocarbamol, oxyCODONE, RESOURCE THICKENUP CLEAR   PHYSICAL EXAM: Vital signs: Vitals:   12/12/16 0400 12/12/16 0449 12/12/16 0832 12/12/16 0911  BP: 115/76 115/76 114/80   Pulse: 81  75   Resp: 15 20 16    Temp:  98.4 F (36.9 C) 97.6 F (36.4 C)   TempSrc:  Oral Oral   SpO2: 93% 92% 91% 99%  Weight:  113.4 kg (250 lb 1.6 oz)    Height:       Filed Weights   12/10/16 0500 12/11/16 0300 12/12/16 0449  Weight: 105.3 kg (232 lb 1.6  oz) 114.5 kg (252 lb 8 oz) 113.4 kg (250 lb 1.6 oz)   Body mass index is 32.11 kg/m.   General appearance :Awake, alert, Looks very frail and slightly lethargic. Eyes:, pupils equally reactive to light and accomodation HEENT: Atraumatic and Normocephalic Neck: supple, no JVD. No cervical lymphadenopathy. Resp:Good air entry bilaterally,Continues to have transmitted upper airway sounds-he has rales up to his mid chest anteriorly.  CVS: S1 S2 regular GI: Bowel sounds present, Non tender and not distended with no gaurding, rigidity or rebound.No organomegaly Extremities: B/L Lower Ext shows no edema, both legs are warm to touch Neurology: Left hemiplegia Musculoskeletal:No digital cyanosis Skin:No Rash, warm and dry Wounds:N/A  I have personally reviewed  following labs and imaging studies  LABORATORY DATA: CBC:  Recent Labs Lab 12/08/16 0738 12/09/16 0329 12/10/16 0415 12/11/16 0257 12/12/16 0135  WBC 29.4* 20.4* 22.7* 15.5* 15.4*  HGB 14.4 14.0 13.4 14.3 12.8*  HCT 44.6 43.1 41.9 43.9 38.7*  MCV 102.8* 102.6* 102.2* 102.3* 100.8*  PLT 197 199 175 167 161    Basic Metabolic Panel:  Recent Labs Lab 12/07/16 0312 12/08/16 0738 12/09/16 0329 12/10/16 0415 12/11/16 0257  NA 153* 155* 154* 151* 149*  K 4.6 4.0 4.3 3.9 4.3  CL 110 113* 112* 109 110  CO2 31 32 30 28 29   GLUCOSE 207* 172* 135* 209* 179*  BUN 86* 85* 83* 82* 75*  CREATININE 2.69* 2.71* 2.79* 3.06* 2.77*  CALCIUM 9.1 9.0 8.8* 8.7* 8.8*  MG  --   --   --   --  2.4    GFR: Estimated Creatinine Clearance: 37.5 mL/min (by C-G formula based on SCr of 2.77 mg/dL (H)).  Liver Function Tests:  Recent Labs Lab 12/08/16 0738 12/11/16 0257  AST 21 26  ALT 17 19  ALKPHOS 84 84  BILITOT 0.6 0.6  PROT 6.3* 6.7  ALBUMIN 1.9* 1.7*   No results for input(s): LIPASE, AMYLASE in the last 168 hours. No results for input(s): AMMONIA in the last 168 hours.  Coagulation Profile:  Recent Labs Lab  12/06/16 0151  INR 1.18    Cardiac Enzymes: No results for input(s): CKTOTAL, CKMB, CKMBINDEX, TROPONINI in the last 168 hours.  BNP (last 3 results) No results for input(s): PROBNP in the last 8760 hours.  HbA1C: No results for input(s): HGBA1C in the last 72 hours.  CBG:  Recent Labs Lab 12/11/16 0802 12/11/16 1215 12/11/16 1710 12/11/16 2145 12/12/16 0829  GLUCAP 200* 182* 222* 204* 151*    Lipid Profile: No results for input(s): CHOL, HDL, LDLCALC, TRIG, CHOLHDL, LDLDIRECT in the last 72 hours.  Thyroid Function Tests: No results for input(s): TSH, T4TOTAL, FREET4, T3FREE, THYROIDAB in the last 72 hours.  Anemia Panel: No results for input(s): VITAMINB12, FOLATE, FERRITIN, TIBC, IRON, RETICCTPCT in the last 72 hours.  Urine analysis:    Component Value Date/Time   COLORURINE YELLOW 12/10/2016 1609   APPEARANCEUR HAZY (A) 12/10/2016 1609   LABSPEC 1.020 12/10/2016 1609   PHURINE 5.0 12/10/2016 1609   GLUCOSEU NEGATIVE 12/10/2016 1609   HGBUR LARGE (A) 12/10/2016 1609   BILIRUBINUR NEGATIVE 12/10/2016 1609   KETONESUR NEGATIVE 12/10/2016 1609   PROTEINUR 100 (A) 12/10/2016 1609   NITRITE NEGATIVE 12/10/2016 1609   LEUKOCYTESUR NEGATIVE 12/10/2016 1609    Sepsis Labs: Lactic Acid, Venous    Component Value Date/Time   LATICACIDVEN 1.7 12/11/2016 0257    MICROBIOLOGY: Recent Results (from the past 240 hour(s))  Culture, blood (Routine X 2) w Reflex to ID Panel     Status: None (Preliminary result)   Collection Time: 12/10/16  3:31 PM  Result Value Ref Range Status   Specimen Description BLOOD LEFT ANTECUBITAL  Final   Special Requests BOTTLES DRAWN AEROBIC AND ANAEROBIC 5CC EA  Final   Culture NO GROWTH < 24 HOURS  Final   Report Status PENDING  Incomplete  Culture, blood (Routine X 2) w Reflex to ID Panel     Status: None (Preliminary result)   Collection Time: 12/10/16  3:38 PM  Result Value Ref Range Status   Specimen Description BLOOD RIGHT  ANTECUBITAL  Final   Special Requests BOTTLES DRAWN AEROBIC AND ANAEROBIC 5CC EA  Final   Culture  Setup Time   Final    GRAM POSITIVE COCCI IN CLUSTERS IN PEDIATRIC BOTTLE Organism ID to follow    Culture NO GROWTH < 24 HOURS  Final   Report Status PENDING  Incomplete  Blood Culture ID Panel (Reflexed)     Status: Abnormal   Collection Time: 12/10/16  3:38 PM  Result Value Ref Range Status   Enterococcus species NOT DETECTED NOT DETECTED Final   Listeria monocytogenes NOT DETECTED NOT DETECTED Final    Comment: CRITICAL RESULT CALLED TO, READ BACK BY AND VERIFIED WITH: T. STONE, PHARM, 12/11/16 AT 1428 BY J FUDESCO    Staphylococcus species DETECTED (A) NOT DETECTED Final    Comment: Methicillin (oxacillin) resistant coagulase negative staphylococcus. Possible blood culture contaminant (unless isolated from more than one blood culture draw or clinical case suggests pathogenicity). No antibiotic treatment is indicated for blood  culture contaminants.    Staphylococcus aureus NOT DETECTED NOT DETECTED Final   Methicillin resistance DETECTED (A) NOT DETECTED Final   Streptococcus species NOT DETECTED NOT DETECTED Final   Streptococcus agalactiae NOT DETECTED NOT DETECTED Final   Streptococcus pneumoniae NOT DETECTED NOT DETECTED Final   Streptococcus pyogenes NOT DETECTED NOT DETECTED Final   Acinetobacter baumannii NOT DETECTED NOT DETECTED Final   Enterobacteriaceae species NOT DETECTED NOT DETECTED Final   Enterobacter cloacae complex NOT DETECTED NOT DETECTED Final   Escherichia coli NOT DETECTED NOT DETECTED Final   Klebsiella oxytoca NOT DETECTED NOT DETECTED Final   Klebsiella pneumoniae NOT DETECTED NOT DETECTED Final   Proteus species NOT DETECTED NOT DETECTED Final   Serratia marcescens NOT DETECTED NOT DETECTED Final   Haemophilus influenzae NOT DETECTED NOT DETECTED Final   Neisseria meningitidis NOT DETECTED NOT DETECTED Final   Pseudomonas aeruginosa NOT DETECTED NOT  DETECTED Final   Candida albicans NOT DETECTED NOT DETECTED Final   Candida glabrata NOT DETECTED NOT DETECTED Final   Candida krusei NOT DETECTED NOT DETECTED Final   Candida parapsilosis NOT DETECTED NOT DETECTED Final   Candida tropicalis NOT DETECTED NOT DETECTED Final    RADIOLOGY STUDIES/RESULTS: Dg Chest 1 View  Result Date: 11/27/2016 CLINICAL DATA:  Larey Seat today.  Left hip fracture. EXAM: CHEST 1 VIEW COMPARISON:  02/24/2016 FINDINGS: Diffuse interstitial coarsening is probably chronic. No consolidation. No effusion. No pneumothorax. New unchanged hilar, mediastinal and cardiac contours. IMPRESSION: Chronic appearing interstitial coarsening. No consolidation or effusion. Electronically Signed   By: Ellery Plunk M.D.   On: 11/27/2016 01:14   Dg Pelvis 1-2 Views  Result Date: 11/27/2016 CLINICAL DATA:  Larey Seat today while trying to get out of bed unassisted. EXAM: PELVIS - 1-2 VIEW COMPARISON:  None. FINDINGS: There is an acute subcapital left hip fracture. No dislocation. No bone lesion or bony destruction. Pelvis appears intact. IMPRESSION: Subcapital left hip fracture Electronically Signed   By: Ellery Plunk M.D.   On: 11/27/2016 01:13   Ct Head Wo Contrast  Result Date: 11/27/2016 CLINICAL DATA:  Larey Seat getting out of bed today, acute LEFT hip fracture. History of hypertension, stroke. EXAM: CT HEAD WITHOUT CONTRAST TECHNIQUE: Contiguous axial images were obtained from the base of the skull through the vertex without intravenous contrast. COMPARISON:  CT HEAD January 26, 2016 FINDINGS: BRAIN: No intraparenchymal hemorrhage, mass effect, midline shift or acute large vascular territory infarcts. Old RIGHT greater than LEFT cerebellar infarcts, severe brachium contrast atrophy. Old RIGHT greater than LEFT pontine infarcts. Old LEFT thalamus and bilateral basal ganglia lacunar infarcts.  Old small RIGHT occipital lobe infarct. LEFT inferior temporal lobe encephalomalacia is likely  posttraumatic. Mild to moderate white matter changes compatible with chronic small vessel ischemic disease. No abnormal extra-axial fluid collection. Moderate ventriculomegaly on the basis of global parenchymal brain volume loss. VASCULAR: Mild to moderate calcific atherosclerosis of the carotid siphons. SKULL: No skull fracture. No significant scalp soft tissue swelling. SINUSES/ORBITS: Bilateral maxillary mucosal retention cysts. Mastoid air cells are well aerated. The included ocular globes and orbital contents are non-suspicious. OTHER: Patient is edentulous. IMPRESSION: No acute intracranial process. Stable examination including numerous old supra- and infratentorial infarcts predominately within posterior circulation, moderate global brain atrophy. Electronically Signed   By: Awilda Metro M.D.   On: 11/27/2016 03:01   Dg Chest Port 1 View  Result Date: 12/11/2016 CLINICAL DATA:  Pneumonia EXAM: PORTABLE CHEST 1 VIEW COMPARISON:  December 10, 2016 FINDINGS: Interstitial and patchy alveolar edema are again noted. Focal airspace consolidation in the left lower lobe is slightly less apparent compared to 1 day prior. There is new atelectatic change in the right mid lung. There is cardiomegaly. There is a mild degree of pulmonary venous hypertension. There is equivocal right pleural effusion. No adenopathy. No pneumothorax. Old healed fracture right clavicle. IMPRESSION: Persistent changes of congestive heart failure. Less airspace consolidation left base compared to 1 day prior. The right midlung atelectasis. Stable cardiac silhouette. Electronically Signed   By: Bretta Bang III M.D.   On: 12/11/2016 07:16   Dg Chest Port 1 View  Result Date: 12/10/2016 CLINICAL DATA:  Fever and shortness of breath EXAM: PORTABLE CHEST 1 VIEW COMPARISON:  December 08, 2016 FINDINGS: There remains interstitial and patchy alveolar edema. There is focal airspace consolidation in the left lower lobe. There is  cardiomegaly with mild pulmonary venous hypertension. There is a a rather minimal right pleural effusion. No adenopathy. No pneumothorax. There is an old healed fracture of the right clavicle. IMPRESSION: Findings felt to be most indicative of a degree of congestive heart failure. Superimposed consolidation in the left base may represent pneumonia or aspiration pneumonitis. Pneumonitis and congestive heart failure may present concurrently. Cardiomegaly is stable. Old healed fracture right clavicle. No evident pneumothorax. Electronically Signed   By: Bretta Bang III M.D.   On: 12/10/2016 07:30   Dg Chest Port 1 View  Result Date: 12/08/2016 CLINICAL DATA:  Chronic CHF, aspiration pneumonia, underlying COPD. EXAM: PORTABLE CHEST 1 VIEW COMPARISON:  Portable chest x-ray of December 07, 2016 FINDINGS: The lungs are slightly less well inflated today. The pulmonary interstitial markings remain increased with areas patchy confluence noted. There is no large pleural effusion. The cardiac silhouette remains enlarged. The pulmonary vascularity remains prominent centrally. There is calcification in the wall of the aortic arch. There is old deformity of the midshaft of the right clavicle. IMPRESSION: Decreased aeration of both lungs today which accentuates the interstitial markings. Persistent bilateral interstitial and alveolar opacities likely reflect pneumonia though mild CHF is suspected as well. Electronically Signed   By: David  [place] M.D.   On: 12/08/2016 07:19   Dg Chest Port 1 View  Result Date: 12/07/2016 CLINICAL DATA:  61 year old male with shortness of breath. Recent fall. Initial encounter. EXAM: PORTABLE CHEST 1 VIEW COMPARISON:  12/06/2016 and earlier. FINDINGS: Portable AP semi upright view at 0515 hours. Stable cardiomegaly and mediastinal contours. Widespread increased pulmonary interstitial markings are chronic to a degree. No superimposed pneumothorax or pulmonary edema. No pleural effusion  is evident. Increased patchy retrocardiac opacity since  11/27/2016. Chronic right clavicle deformity. IMPRESSION: Cardiomegaly and chronic lung disease. Left lung base opacity has increased since 11/27/2016, suspicious for left lung base pneumonia. No pleural effusion identified. Electronically Signed   By: Odessa Fleming M.D.   On: 12/07/2016 07:43   Dg Chest Port 1 View  Result Date: 12/06/2016 CLINICAL DATA:  Shortness of breath . EXAM: PORTABLE CHEST 1 VIEW COMPARISON:  12/05/2016. FINDINGS: Cardiomegaly with progressive bilateral pulmonary infiltrates consistent with congestive heart failure and pulmonary edema. Bilateral pneumonia cannot be excluded . Small right pleural effusion cannot be excluded. No pneumothorax . Healed right clavicular fracture. IMPRESSION: 1. Cardiomegaly with progressive bilateral pulmonary infiltrates/ pulmonary edema. Findings consistent with congestive heart failure. Bilateral pneumonia cannot be excluded. Small right pleural effusion cannot be excluded. 2.  Low lung volumes. Electronically Signed   By: Maisie Fus  Register   On: 12/06/2016 06:50   Dg Chest Port 1 View  Result Date: 12/05/2016 CLINICAL DATA:  Shortness of breath . EXAM: PORTABLE CHEST 1 VIEW COMPARISON:  12/04/2016 . FINDINGS: Cardiomegaly with bilateral from interstitial prominence. No change from prior exam. No pleural effusion or pneumothorax P IMPRESSION: Cardiomegaly with bilateral mild interstitial prominence consistent interstitial edema again noted. No interim change from prior exam. Electronically Signed   By: Maisie Fus  Register   On: 12/05/2016 07:23   Dg Chest Port 1 View  Result Date: 12/04/2016 CLINICAL DATA:  Shortness of Breath EXAM: PORTABLE CHEST 1 VIEW COMPARISON:  12/03/2016 FINDINGS: Cardiomegaly. Mild interstitial prominence and vascular congestion. Question mild interstitial edema. No effusions or acute bony abnormality. IMPRESSION: Vascular congestion with interstitial prominence, question mild  interstitial edema. Electronically Signed   By: Charlett Nose M.D.   On: 12/04/2016 07:11   Dg Chest Port 1 View  Result Date: 12/03/2016 CLINICAL DATA:  Shortness of Breath EXAM: PORTABLE CHEST 1 VIEW COMPARISON:  12/02/2016 FINDINGS: Cardiac shadow is enlarged. Lungs are well aerated bilaterally. Vascular congestion is noted without significant interstitial edema. No sizable effusion is noted. No bony abnormality is seen. IMPRESSION: Stable vascular congestion. Electronically Signed   By: Alcide Clever M.D.   On: 12/03/2016 15:20   Dg Chest Port 1 View  Result Date: 12/02/2016 CLINICAL DATA:  Dyspnea EXAM: PORTABLE CHEST 1 VIEW COMPARISON:  Chest radiograph from one day prior. FINDINGS: Slightly right rotated chest radiograph. Stable cardiomediastinal silhouette with mild cardiomegaly. No pneumothorax. No pleural effusion. Mild pulmonary edema, decreased. IMPRESSION: Mild congestive heart failure, improved. Electronically Signed   By: Delbert Phenix M.D.   On: 12/02/2016 08:06   Dg Chest Port 1 View  Addendum Date: 12/01/2016   ADDENDUM REPORT: 12/01/2016 08:08 ADDENDUM: There is an old healed right clavicle fracture, stable. Electronically Signed   By: Bretta Bang III M.D.   On: 12/01/2016 08:08   Result Date: 12/01/2016 CLINICAL DATA:  Shortness of Breath EXAM: PORTABLE CHEST 1 VIEW COMPARISON:  November 30, 2016 FINDINGS: Extensive airspace consolidation bilaterally, most severe in the left upper to mid lung zones, up remains without significant change. There is cardiomegaly with pulmonary venous hypertension. No adenopathy evident. No bone lesions. IMPRESSION: Widespread airspace consolidation bilaterally, somewhat more on the left than on the right. Evidence a degree of underlying pulmonary vascular congestion. Question multifocal pneumonia versus pulmonary edema; both entities may well exist concurrently. The overall appearance of the lungs and cardiomediastinal silhouette is stable compared to 1  day prior. Electronically Signed: By: Bretta Bang III M.D. On: 12/01/2016 08:02   Dg Chest [facility] [address]  Result Date: 11/28/2016 CLINICAL DATA:  Shortness of breath. EXAM: PORTABLE CHEST 1 VIEW COMPARISON:  11/27/2016 and 02/24/2016 FINDINGS: The patient has progressive bilateral pulmonary infiltrates which may represent pulmonary edema or progressive pneumonia. Pulmonary vascularity appears normal in the overall heart size is normal. IMPRESSION: Probable progressive bilateral pneumonia. Electronically Signed   By: Francene Boyers M.D.   On: 11/28/2016 15:31   Dg Chest Port 1 View  Result Date: 11/27/2016 CLINICAL DATA:  sob EXAM: PORTABLE CHEST 1 VIEW COMPARISON:  11/27/2016 FINDINGS: The heart is enlarged. There are airspace filling opacities bilaterally, particularly well seen at the left lung base. Patient is rotated. IMPRESSION: Bilateral airspace filling opacities consistent with pulmonary edema and/or infectious process. Electronically Signed   By: Norva Pavlov M.D.   On: 11/27/2016 22:17   Dg Chest Port 1v Same Day  Result Date: 11/30/2016 CLINICAL DATA:  Shortness of breath, cough today, pneumonia, history hypertension, prior respiratory failure, coronary disease, chronic systolic CHF, ischemic cardiomyopathy EXAM: PORTABLE CHEST 1 VIEW COMPARISON:  Portable exam 0922 hours compared to 11/28/2016 FINDINGS: Rotated to the LEFT with kyphotic positioning. Enlargement of cardiac silhouette. Stable mediastinal contours for degree of rotation. Extensive BILATERAL pulmonary infiltrates favor pneumonia over edema. No pleural effusion or pneumothorax. Bones unremarkable. IMPRESSION: Persistent BILATERAL pulmonary infiltrates question pneumonia. Enlargement of cardiac silhouette. Electronically Signed   By: Ulyses Southward M.D.   On: 11/30/2016 09:31   Dg Swallowing Func-speech Pathology  Result Date: 11/30/2016 Objective Swallowing Evaluation: Type of Study: MBS-Modified Barium Swallow Study Patient  Details Name: DRESHAUN STENE MRN: [id number] Date of Birth: [birth year] Today's Date: 11/30/2016 Time: SLP Start Time (ACUTE ONLY): 1310-SLP Stop Time (ACUTE ONLY): 1325 SLP Time Calculation (min) (ACUTE ONLY): 15 min Past Medical History: Past Medical History: Diagnosis Date . Acute respiratory failure (HCC)   a. Spring 2017 following fall/splenectomy -->admitted to [facility]-->trach/g tube. . Anemia   a. 12/2015 ABL in setting of fall/hematomas/splenic laceration req splenectomy. Marland Kitchen Apical mural thrombus   a. 11/2015 Echo: EF 25-30%, moderate size apical thrombus-->coumadin;  b. 12/2015 f/u Echo: EF 25-30%, ant/septa HK, no obvious large thrombus but cannot exclude small mural thrombus. Marland Kitchen CAD (coronary artery disease)   a. 12/2014 s/p BMS to the RCA. Marland Kitchen Cerebral infarction (HCC)  . Chronic systolic CHF (congestive heart failure) (HCC)   a. 12/2015 Echo: EF 25-30%. . Dementia  . Depression  . Dysphagia  . Falls   a. 11/2015 traumatic fall with resultant trauma req splenectomy and prolonged hospitalization complicated by resp failure. Marland Kitchen History of pneumonia  . Hyperlipidemia  . Hypertension  . Ischemic cardiomyopathy   a. 12/2015 Echo: EF 25-30%, anterior and septal HK. Marland Kitchen Left hemiplegia (HCC)  . Protein calorie malnutrition (HCC)  . Respiratory failure (HCC)  . Spleen injury  . Status post tracheostomy (HCC)   a. 01/2016 in setting of ongoing resp failure and aspiration. Past Surgical History: Past Surgical History: Procedure Laterality Date . CARDIAC CATHETERIZATION   . CORONARY ANGIOPLASTY WITH STENT PLACEMENT   . IR GENERIC HISTORICAL  07/22/2016  IR GASTROSTOMY TUBE REMOVAL 07/22/2016 [name], MD [facility]-INTERV RAD . PR LAP, SPLENECTOMY  01/07/2016 . TRACHEOSTOMY TUBE PLACEMENT N/A 02/22/2016  Procedure: TRACHEOSTOMY;  Surgeon: [name], MD;  Location: [facility] OR;  Service: ENT;  Laterality: N/A; HPI: Pt. with PMH of HTN, chronic systolic CHF, chronic respiratory failure on 3L of oxygen, diabetes mellitus,  recurrent fall, chronic anticoagulation, dyslipidemia; admitted on 11/26/2016, with complaint of A mechanical fall,  was found to have closed left hip fracture.Patient developed aspiration pneumonia in the hospital. Currently surgery is on hold until pneumonia is better. Per MD pt was on dys 2/nectar thick liquids at SNF, but has been on thin liquids here. Pt had an MBS during admission to [facility] in 2017, but record not available. Pt also had a trach at that time. Reproted has a history of dysphagia due to prior CVAs.  No Data Recorded Assessment / Plan / Recommendation CHL IP CLINICAL IMPRESSIONS 11/30/2016 Therapy Diagnosis Severe pharyngeal phase dysphagia Clinical Impression Pt demonstrates a severe oropharygneal dysphagia with decreased duration and magnitude of hyolaryngeal excursion leading to severe oropharyngeal residuals that are aspirated with and without sensation in further swallow attempts. Base of tongue propulsion of bolus is also poor and epiglottis does not deflect for airway protection. Thin liquids pose a severe risk of aspiration and nectar thick liquids reduce severity of aspiration events, but are still likely to be aspirated due to increased residuals with thick viscosity. A chin tuck significantly improves bolus transit if fully tucked against chest. Would suspect that carry over for this will be poor, but will trial dys 3 solids and nectar thick liquids with full supervision for a chin tuck to determine if it is achievable.  Impact on safety and function Severe aspiration risk   CHL IP TREATMENT RECOMMENDATION 11/30/2016 Treatment Recommendations Therapy as outlined in treatment plan below   Prognosis 11/30/2016 Prognosis for Safe Diet Advancement Guarded Barriers to Reach Goals Severity of deficits;Time post onset Barriers/Prognosis Comment -- CHL IP DIET RECOMMENDATION 11/30/2016 SLP Diet Recommendations Dysphagia 3 (Mech soft) solids;Nectar thick liquid Liquid Administration via  Cup;Straw Medication Administration Whole meds with puree Compensations Chin tuck;Slow rate;Small sips/bites;Minimize environmental distractions Postural Changes Remain semi-upright after after feeds/meals (Comment);Seated upright at 90 degrees   CHL IP OTHER RECOMMENDATIONS 11/30/2016 Recommended Consults -- Oral Care Recommendations Oral care BID Other Recommendations Order thickener from pharmacy   CHL IP FOLLOW UP RECOMMENDATIONS 11/30/2016 Follow up Recommendations Skilled Nursing facility   [facility] IP FREQUENCY AND DURATION 11/30/2016 Speech Therapy Frequency (ACUTE ONLY) min 2x/week Treatment Duration 2 weeks      CHL IP ORAL PHASE 11/30/2016 Oral Phase WFL Oral - Pudding Teaspoon -- Oral - Pudding Cup -- Oral - Honey Teaspoon -- Oral - Honey Cup -- Oral - Nectar Teaspoon -- Oral - Nectar Cup -- Oral - Nectar Straw -- Oral - Thin Teaspoon -- Oral - Thin Cup -- Oral - Thin Straw -- Oral - Puree -- Oral - Mech Soft -- Oral - Regular -- Oral - Multi-Consistency -- Oral - Pill -- Oral Phase - Comment --  CHL IP PHARYNGEAL PHASE 11/30/2016 Pharyngeal Phase Impaired Pharyngeal- Pudding Teaspoon -- Pharyngeal -- Pharyngeal- Pudding Cup -- Pharyngeal -- Pharyngeal- Honey Teaspoon -- Pharyngeal -- Pharyngeal- Honey Cup -- Pharyngeal -- Pharyngeal- Nectar Teaspoon -- Pharyngeal -- Pharyngeal- Nectar Cup Reduced epiglottic inversion;Reduced anterior laryngeal mobility;Reduced airway/laryngeal closure;Reduced tongue base retraction;Delayed swallow initiation-pyriform sinuses;Pharyngeal residue - valleculae;Pharyngeal residue - pyriform;Compensatory strategies attempted (with notebox) Pharyngeal -- Pharyngeal- Nectar Straw Reduced epiglottic inversion;Reduced anterior laryngeal mobility;Reduced airway/laryngeal closure;Reduced tongue base retraction;Delayed swallow initiation-pyriform sinuses;Pharyngeal residue - valleculae;Pharyngeal residue - pyriform;Compensatory strategies attempted (with notebox);Other (Comment) Pharyngeal --  Pharyngeal- Thin Teaspoon -- Pharyngeal -- Pharyngeal- Thin Cup Reduced epiglottic inversion;Reduced anterior laryngeal mobility;Reduced airway/laryngeal closure;Reduced tongue base retraction;Delayed swallow initiation-pyriform sinuses;Pharyngeal residue - valleculae;Pharyngeal residue - pyriform;Penetration/Apiration after swallow;Significant aspiration (Amount) Pharyngeal Material enters airway, passes BELOW cords and not ejected out despite  cough attempt by patient Pharyngeal- Thin Straw Reduced epiglottic inversion;Reduced anterior laryngeal mobility;Reduced airway/laryngeal closure;Reduced tongue base retraction;Delayed swallow initiation-pyriform sinuses;Pharyngeal residue - valleculae;Pharyngeal residue - pyriform;Compensatory strategies attempted (with notebox);Penetration/Aspiration during swallow Pharyngeal Material enters airway, passes BELOW cords without attempt by patient to eject out (silent aspiration) Pharyngeal- Puree Reduced epiglottic inversion;Reduced anterior laryngeal mobility;Reduced airway/laryngeal closure;Reduced tongue base retraction;Pharyngeal residue - valleculae;Pharyngeal residue - pyriform;Compensatory strategies attempted (with notebox);Delayed swallow initiation-vallecula Pharyngeal -- Pharyngeal- Mechanical Soft Reduced epiglottic inversion;Reduced anterior laryngeal mobility;Reduced airway/laryngeal closure;Reduced tongue base retraction;Pharyngeal residue - valleculae;Pharyngeal residue - pyriform;Compensatory strategies attempted (with notebox);Delayed swallow initiation-vallecula Pharyngeal -- Pharyngeal- Regular -- Pharyngeal -- Pharyngeal- Multi-consistency -- Pharyngeal -- Pharyngeal- Pill Reduced epiglottic inversion;Reduced anterior laryngeal mobility;Reduced airway/laryngeal closure;Reduced tongue base retraction;Pharyngeal residue - valleculae;Pharyngeal residue - pyriform;Compensatory strategies attempted (with notebox);Delayed swallow initiation-vallecula Pharyngeal  -- Pharyngeal Comment --  No flowsheet data found. CHL IP GO 11/29/2016 Functional Assessment Tool Used clinical judgement Functional Limitations Swallowing Swallow Current Status [phone]) CJ Swallow Goal Status (U0454) CJ Swallow Discharge Status (U9811) (None) Motor Speech Current Status [phone]) (None) Motor Speech Goal Status (G9562) (None) Motor Speech Goal Status (Z3086) (None) Spoken Language Comprehension Current Status (V7846) (None) Spoken Language Comprehension Goal Status (N6295) (None) Spoken Language Comprehension Discharge Status [phone]) (None) Spoken Language Expression Current Status [phone]) (None) Spoken Language Expression Goal Status [phone]) (None) Spoken Language Expression Discharge Status [phone]) (None) Attention Current Status (I3474) (None) Attention Goal Status (Q5956) (None) Attention Discharge Status [phone]) (None) Memory Current Status (E3329) (None) Memory Goal Status (J1884) (None) Memory Discharge Status (Z6606) (None) Voice Current Status (T0160) (None) Voice Goal Status (F0932) (None) Voice Discharge Status (T5573) (None) Other Speech-Language Pathology Functional Limitation [phone]) (None) Other Speech-Language Pathology Functional Limitation Goal Status (K2706) (None) Other Speech-Language Pathology Functional Limitation Discharge Status [phone]) (None) Harlon Ditty, MA CCC-SLP [phone] [place], Riley Nearing 11/30/2016, 2:50 PM              Dg Femur Min 2 Views Left  Result Date: 11/27/2016 CLINICAL DATA:  Larey Seat while attempting to get out of bed unassisted today. EXAM: LEFT FEMUR 2 VIEWS COMPARISON:  None. FINDINGS: There is a subcapital left hip fracture. Remainder of the femur is intact. No dislocation. No bone lesion or bony destruction. IMPRESSION: Subcapital left hip fracture Electronically Signed   By: Ellery Plunk M.D.   On: 11/27/2016 01:13     LOS: 15 days   Leroy Sea, MD  [facility] Pager:[phone]  If 7PM-7AM, please contact  night-coverage [email or web address] Password [facility] 12/12/2016, 9:56 AM

## 2016-12-12 NOTE — Progress Notes (Signed)
Name: Curtis Keller MRN: [id number] DOB: 1956/05/20    ADMISSION DATE:  11/26/2016 CONSULTATION DATE:  12/10/16  REFERRING MD :  [facility] /Dr. Jerral Ralph   CHIEF COMPLAINT:  Resp Failure/PNA  BRIEF PATIENT DESCRIPTION:  61 yo male with hx of Systolic CHF (EF 21-22%), hx of LV thrombus/prior CVA w/ left sided hemiplegia -frequent falls admitted 11/26/16 for left hip fracture after fall. Prior to surgery developed possible aspiration PNA +/- acute CHF, a/c renal failure. He has been treated aggressively with IV abx and diuresis with minimal improvement. Unable to have surgery due to medical instability. Pt DNR status was changed by family member. Palliative care has been following .   SIGNIFICANT EVENTS  2/4 admitted left hip fracture  STUDIES:  2/4 XR >left hip fracture  2/4 CT head chronic changes   SUBJECTIVE:  No distress.  VITAL SIGNS: Temp:  [97.6 F (36.4 C)-100.5 F (38.1 C)] 97.6 F (36.4 C) (02/19 0832) Pulse Rate:  [72-85] 75 (02/19 0832) Resp:  [15-20] 16 (02/19 0832) BP: (105-125)/(71-87) 114/80 (02/19 0832) SpO2:  [89 %-99 %] 99 % (02/19 0911) Weight:  [250 lb 1.6 oz (113.4 kg)] 250 lb 1.6 oz (113.4 kg) (02/19 0449) 7 liters high-flow  General appearance:  61 Year old  Male NAD,conversant  Eyes: anicteric sclerae , moist conjunctivae; PERRL, EOMI bilaterally. Mouth:  membranes and no mucosal ulcerations; normal hard and soft palate Neck: Trachea midline; neck supple, no JVD Lungs/chest: diffuse rhonchi, with normal respiratory effort and no intercostal retractions CV: RRR, no MRGs  Abdomen: Soft, non-tender; no masses or HSM Extremities: +  peripheral edema or extremity lymphadenopathy Skin: Normal temperature, turgor and texture; no rash, ulcers or subcutaneous nodules Neuro/Psych: Appropriate affect, alert and oriented to person, place and time, left sided hemiparesis     Recent Labs Lab 12/09/16 0329 12/10/16 0415 12/11/16 0257  NA 154* 151* 149*  K 4.3 3.9  4.3  CL 112* 109 110  CO2 30 28 29   BUN 83* 82* 75*  CREATININE 2.79* 3.06* 2.77*  GLUCOSE 135* 209* 179*    Recent Labs Lab 12/10/16 0415 12/11/16 0257 12/12/16 0135  HGB 13.4 14.3 12.8*  HCT 41.9 43.9 38.7*  WBC 22.7* 15.5* 15.4*  PLT 175 167 161   Dg Chest Port 1 View  Result Date: 12/11/2016 CLINICAL DATA:  Pneumonia EXAM: PORTABLE CHEST 1 VIEW COMPARISON:  December 10, 2016 FINDINGS: Interstitial and patchy alveolar edema are again noted. Focal airspace consolidation in the left lower lobe is slightly less apparent compared to 1 day prior. There is new atelectatic change in the right mid lung. There is cardiomegaly. There is a mild degree of pulmonary venous hypertension. There is equivocal right pleural effusion. No adenopathy. No pneumothorax. Old healed fracture right clavicle. IMPRESSION: Persistent changes of congestive heart failure. Less airspace consolidation left base compared to 1 day prior. The right midlung atelectasis. Stable cardiac silhouette. Electronically Signed   By: Bretta Bang III M.D.   On: 12/11/2016 07:16  Right > left sided airspace disease. Improved aeration  ASSESSMENT / PLAN: Acute hypoxic respiratory failure in setting of pulmonary edema and volume overload super-imposed on aspiration PNA (His dysphagia is a well documented and chronic issue). ->clinically improved.  ->family now agreeable for DNR Plan Cont aspiration precautions as able Wean O2 Cont abx per primary service BIPAP ok short term but given his dysphagia and aspiration risk NOT a good long term solution Cont pulm hygiene measures Agree w/ DNR  Acute  encephalopathy. Seems better. This is already superimposed on prior CVA w/ left sided hemiparesis  Plan Supportive care  Left hip fracture -->pain controled. I think pain would be only indication for surgical intervention as he was not really ambulatory in first place.  Plan Not surgical candidate Cont pain rx   Systolic CM  (EF 16-10%) w/ H/o left apical thrombus  Plan Cont anticoagulation per IM service   Hypernatremia, CRI, DM w/ hyperglycemia Plan Per primary service    PCCM will s/o  Simonne Martinet ACNP-BC [facility] Pulmonary/Critical Care Pager # [phone] OR # [phone] if no answer    12/12/2016, 10:36 AM

## 2016-12-12 NOTE — Progress Notes (Signed)
CPT not done at this time due to pt having just finished lunch. Will do CPT with 1400 treatment.

## 2016-12-12 NOTE — Progress Notes (Addendum)
ANTICOAGULATION CONSULT NOTE - f/u Consult  Pharmacy Consult for heparin, coumadin  Indication: mural thrombus with history of stroke  Allergies  Allergen Reactions  . Codeine Anaphylaxis and Nausea And Vomiting  . Hydrocodone Other (See Comments)    On MAR  . Hydrocodone-Acetaminophen Nausea And Vomiting  . Tegaderm Ag Mesh [Silver] Other (See Comments)    On MAR  . Lisinopril Itching and Rash  . Tylenol [Acetaminophen] Nausea Only    Patient Measurements: Height: 6\' 2"  (188 cm) Weight: 250 lb 1.6 oz (113.4 kg) IBW/kg (Calculated) : 82.2 Heparin Dosing Weight: 95kg  Vital Signs: Temp: 97.8 F (36.6 C) (02/19 1213) Temp Source: Oral (02/19 1213) BP: 109/79 (02/19 1213) Pulse Rate: 79 (02/19 1213)  Labs:  Recent Labs  12/10/16 0415 12/11/16 0257  12/12/16 0135 12/12/16 0524 12/12/16 1239  HGB 13.4 14.3  --  12.8*  --   --   HCT 41.9 43.9  --  38.7*  --   --   PLT 175 167  --  161  --   --   HEPARINUNFRC  --   --   < > 0.22* 0.20* 0.10*  CREATININE 3.06* 2.77*  --   --   --   --   < > = values in this interval not displayed.  Estimated Creatinine Clearance: 37.5 mL/min (by C-G formula based on SCr of 2.77 mg/dL (H)).   Medical History: Past Medical History:  Diagnosis Date  . Acute respiratory failure (HCC)    a. Spring 2017 following fall/splenectomy -->admitted to [facility]-->trach/g tube.  . Anemia    a. 12/2015 ABL in setting of fall/hematomas/splenic laceration req splenectomy.  Marland Kitchen Apical mural thrombus    a. 11/2015 Echo: EF 25-30%, moderate size apical thrombus-->coumadin;  b. 12/2015 f/u Echo: EF 25-30%, ant/septa HK, no obvious large thrombus but cannot exclude small mural thrombus.  Marland Kitchen CAD (coronary artery disease)    a. 12/2014 s/p BMS to the RCA.  Marland Kitchen Cerebral infarction (HCC)   . Chronic systolic CHF (congestive heart failure) (HCC)    a. 12/2015 Echo: EF 25-30%.  . Dementia   . Depression   . Dysphagia   . Falls    a. 11/2015 traumatic  fall with resultant trauma req splenectomy and prolonged hospitalization complicated by resp failure.  Marland Kitchen History of pneumonia   . Hyperlipidemia   . Hypertension   . Ischemic cardiomyopathy    a. 12/2015 Echo: EF 25-30%, anterior and septal HK.  Marland Kitchen Left hemiplegia (HCC)   . Protein calorie malnutrition (HCC)   . Respiratory failure (HCC)   . Spleen injury   . Status post tracheostomy (HCC)    a. 01/2016 in setting of ongoing resp failure and aspiration.   Assessment: 61 y.o. male with history chronic systolic heart failure (EF 25-30%), history of LV thrombus (on coumadin PTA), prior CVA with chronic left-sided hemiplegia-multiple falls over the past few years causing splenic rupture-presented to the hospital on 2/3 following a mechanical fall, found to have a left hip fracture. Family prefers to continue aggressive care including anticoagulation so IV heparin was restarted on 2/18. Warfarin restarted 2/19 -heparin level= 0.1, INR= 1.18 on 2/13  Coumadin dose PTA: 3.5mg /d  Goal of Therapy:  Heparin level 0.3-0.7 units/ml Monitor platelets by anticoagulation protocol: Yes   Plan:  -heparin 2000 unit bolus and Increase to 1950 units/hr -Heparin level in 8 hours and daily wth CBC daily -Coumadin 5mg  po today -Daily PT/INR  Harland German, Pharm D  12/12/2016 1:54 PM

## 2016-12-13 ENCOUNTER — Inpatient Hospital Stay
Admission: AD | Admit: 2016-12-13 | Discharge: 2017-01-14 | Disposition: A | Discharge status: Other Acute Inpatient Hospital | Payer: Self-pay | Source: Ambulatory Visit | Attending: Internal Medicine | Admitting: Internal Medicine

## 2016-12-13 DIAGNOSIS — Z930 Tracheostomy status: Secondary | ICD-10-CM | POA: Diagnosis not present

## 2016-12-13 DIAGNOSIS — I509 Heart failure, unspecified: Secondary | ICD-10-CM | POA: Diagnosis not present

## 2016-12-13 DIAGNOSIS — I517 Cardiomegaly: Secondary | ICD-10-CM | POA: Diagnosis not present

## 2016-12-13 DIAGNOSIS — Z955 Presence of coronary angioplasty implant and graft: Secondary | ICD-10-CM | POA: Diagnosis not present

## 2016-12-13 DIAGNOSIS — Z6832 Body mass index (BMI) 32.0-32.9, adult: Secondary | ICD-10-CM | POA: Diagnosis not present

## 2016-12-13 DIAGNOSIS — F0281 Dementia in other diseases classified elsewhere with behavioral disturbance: Secondary | ICD-10-CM | POA: Diagnosis not present

## 2016-12-13 DIAGNOSIS — G894 Chronic pain syndrome: Secondary | ICD-10-CM | POA: Diagnosis not present

## 2016-12-13 DIAGNOSIS — L89152 Pressure ulcer of sacral region, stage 2: Secondary | ICD-10-CM | POA: Diagnosis present

## 2016-12-13 DIAGNOSIS — J982 Interstitial emphysema: Secondary | ICD-10-CM | POA: Diagnosis not present

## 2016-12-13 DIAGNOSIS — J449 Chronic obstructive pulmonary disease, unspecified: Secondary | ICD-10-CM | POA: Diagnosis not present

## 2016-12-13 DIAGNOSIS — I429 Cardiomyopathy, unspecified: Secondary | ICD-10-CM | POA: Diagnosis not present

## 2016-12-13 DIAGNOSIS — Z4659 Encounter for fitting and adjustment of other gastrointestinal appliance and device: Secondary | ICD-10-CM

## 2016-12-13 DIAGNOSIS — N189 Chronic kidney disease, unspecified: Secondary | ICD-10-CM | POA: Diagnosis not present

## 2016-12-13 DIAGNOSIS — I110 Hypertensive heart disease with heart failure: Secondary | ICD-10-CM | POA: Diagnosis not present

## 2016-12-13 DIAGNOSIS — E46 Unspecified protein-calorie malnutrition: Secondary | ICD-10-CM | POA: Diagnosis not present

## 2016-12-13 DIAGNOSIS — Z0189 Encounter for other specified special examinations: Secondary | ICD-10-CM

## 2016-12-13 DIAGNOSIS — K219 Gastro-esophageal reflux disease without esophagitis: Secondary | ICD-10-CM | POA: Diagnosis not present

## 2016-12-13 DIAGNOSIS — B3789 Other sites of candidiasis: Secondary | ICD-10-CM | POA: Diagnosis present

## 2016-12-13 DIAGNOSIS — Z452 Encounter for adjustment and management of vascular access device: Secondary | ICD-10-CM | POA: Diagnosis not present

## 2016-12-13 DIAGNOSIS — R4182 Altered mental status, unspecified: Secondary | ICD-10-CM

## 2016-12-13 DIAGNOSIS — J181 Lobar pneumonia, unspecified organism: Secondary | ICD-10-CM | POA: Diagnosis not present

## 2016-12-13 DIAGNOSIS — Z7901 Long term (current) use of anticoagulants: Secondary | ICD-10-CM | POA: Diagnosis not present

## 2016-12-13 DIAGNOSIS — J9383 Other pneumothorax: Secondary | ICD-10-CM | POA: Diagnosis not present

## 2016-12-13 DIAGNOSIS — J9600 Acute respiratory failure, unspecified whether with hypoxia or hypercapnia: Secondary | ICD-10-CM | POA: Diagnosis not present

## 2016-12-13 DIAGNOSIS — F418 Other specified anxiety disorders: Secondary | ICD-10-CM | POA: Diagnosis not present

## 2016-12-13 DIAGNOSIS — N179 Acute kidney failure, unspecified: Secondary | ICD-10-CM | POA: Diagnosis not present

## 2016-12-13 DIAGNOSIS — J9610 Chronic respiratory failure, unspecified whether with hypoxia or hypercapnia: Secondary | ICD-10-CM | POA: Diagnosis not present

## 2016-12-13 DIAGNOSIS — Z87891 Personal history of nicotine dependence: Secondary | ICD-10-CM | POA: Diagnosis not present

## 2016-12-13 DIAGNOSIS — I10 Essential (primary) hypertension: Secondary | ICD-10-CM | POA: Diagnosis not present

## 2016-12-13 DIAGNOSIS — R531 Weakness: Secondary | ICD-10-CM | POA: Diagnosis not present

## 2016-12-13 DIAGNOSIS — G8922 Chronic post-thoracotomy pain: Secondary | ICD-10-CM | POA: Diagnosis not present

## 2016-12-13 DIAGNOSIS — D649 Anemia, unspecified: Secondary | ICD-10-CM | POA: Diagnosis not present

## 2016-12-13 DIAGNOSIS — Z9289 Personal history of other medical treatment: Secondary | ICD-10-CM

## 2016-12-13 DIAGNOSIS — I255 Ischemic cardiomyopathy: Secondary | ICD-10-CM | POA: Diagnosis not present

## 2016-12-13 DIAGNOSIS — G8912 Acute post-thoracotomy pain: Secondary | ICD-10-CM | POA: Diagnosis not present

## 2016-12-13 DIAGNOSIS — J9690 Respiratory failure, unspecified, unspecified whether with hypoxia or hypercapnia: Secondary | ICD-10-CM | POA: Diagnosis not present

## 2016-12-13 DIAGNOSIS — R339 Retention of urine, unspecified: Secondary | ICD-10-CM | POA: Diagnosis not present

## 2016-12-13 DIAGNOSIS — J9620 Acute and chronic respiratory failure, unspecified whether with hypoxia or hypercapnia: Secondary | ICD-10-CM | POA: Diagnosis not present

## 2016-12-13 DIAGNOSIS — N184 Chronic kidney disease, stage 4 (severe): Secondary | ICD-10-CM | POA: Diagnosis not present

## 2016-12-13 DIAGNOSIS — S72002A Fracture of unspecified part of neck of left femur, initial encounter for closed fracture: Secondary | ICD-10-CM | POA: Diagnosis present

## 2016-12-13 DIAGNOSIS — S72002D Fracture of unspecified part of neck of left femur, subsequent encounter for closed fracture with routine healing: Secondary | ICD-10-CM | POA: Diagnosis not present

## 2016-12-13 DIAGNOSIS — I6930 Unspecified sequelae of cerebral infarction: Secondary | ICD-10-CM | POA: Diagnosis not present

## 2016-12-13 DIAGNOSIS — Z4682 Encounter for fitting and adjustment of non-vascular catheter: Secondary | ICD-10-CM | POA: Diagnosis not present

## 2016-12-13 DIAGNOSIS — K439 Ventral hernia without obstruction or gangrene: Secondary | ICD-10-CM | POA: Diagnosis not present

## 2016-12-13 DIAGNOSIS — J939 Pneumothorax, unspecified: Secondary | ICD-10-CM | POA: Diagnosis not present

## 2016-12-13 DIAGNOSIS — S31102A Unspecified open wound of abdominal wall, epigastric region without penetration into peritoneal cavity, initial encounter: Secondary | ICD-10-CM | POA: Diagnosis not present

## 2016-12-13 DIAGNOSIS — G8194 Hemiplegia, unspecified affecting left nondominant side: Secondary | ICD-10-CM | POA: Diagnosis not present

## 2016-12-13 DIAGNOSIS — I5021 Acute systolic (congestive) heart failure: Secondary | ICD-10-CM | POA: Diagnosis not present

## 2016-12-13 DIAGNOSIS — D62 Acute posthemorrhagic anemia: Secondary | ICD-10-CM | POA: Diagnosis not present

## 2016-12-13 DIAGNOSIS — Z66 Do not resuscitate: Secondary | ICD-10-CM | POA: Diagnosis present

## 2016-12-13 DIAGNOSIS — Z95828 Presence of other vascular implants and grafts: Secondary | ICD-10-CM

## 2016-12-13 DIAGNOSIS — I240 Acute coronary thrombosis not resulting in myocardial infarction: Secondary | ICD-10-CM | POA: Diagnosis present

## 2016-12-13 DIAGNOSIS — F0390 Unspecified dementia without behavioral disturbance: Secondary | ICD-10-CM | POA: Diagnosis present

## 2016-12-13 DIAGNOSIS — J811 Chronic pulmonary edema: Secondary | ICD-10-CM | POA: Diagnosis not present

## 2016-12-13 DIAGNOSIS — M84459A Pathological fracture, hip, unspecified, initial encounter for fracture: Secondary | ICD-10-CM | POA: Diagnosis not present

## 2016-12-13 DIAGNOSIS — Z794 Long term (current) use of insulin: Secondary | ICD-10-CM | POA: Diagnosis not present

## 2016-12-13 DIAGNOSIS — M6281 Muscle weakness (generalized): Secondary | ICD-10-CM | POA: Diagnosis not present

## 2016-12-13 DIAGNOSIS — R0602 Shortness of breath: Secondary | ICD-10-CM | POA: Diagnosis not present

## 2016-12-13 DIAGNOSIS — S0990XA Unspecified injury of head, initial encounter: Secondary | ICD-10-CM | POA: Diagnosis not present

## 2016-12-13 DIAGNOSIS — J9811 Atelectasis: Secondary | ICD-10-CM | POA: Diagnosis not present

## 2016-12-13 DIAGNOSIS — S72002K Fracture of unspecified part of neck of left femur, subsequent encounter for closed fracture with nonunion: Secondary | ICD-10-CM | POA: Diagnosis not present

## 2016-12-13 DIAGNOSIS — S72009A Fracture of unspecified part of neck of unspecified femur, initial encounter for closed fracture: Secondary | ICD-10-CM | POA: Diagnosis not present

## 2016-12-13 DIAGNOSIS — R918 Other nonspecific abnormal finding of lung field: Secondary | ICD-10-CM | POA: Diagnosis not present

## 2016-12-13 DIAGNOSIS — R1310 Dysphagia, unspecified: Secondary | ICD-10-CM | POA: Diagnosis not present

## 2016-12-13 DIAGNOSIS — E1122 Type 2 diabetes mellitus with diabetic chronic kidney disease: Secondary | ICD-10-CM | POA: Diagnosis not present

## 2016-12-13 DIAGNOSIS — S3600XA Unspecified injury of spleen, initial encounter: Secondary | ICD-10-CM | POA: Diagnosis not present

## 2016-12-13 DIAGNOSIS — E119 Type 2 diabetes mellitus without complications: Secondary | ICD-10-CM | POA: Diagnosis not present

## 2016-12-13 DIAGNOSIS — I69354 Hemiplegia and hemiparesis following cerebral infarction affecting left non-dominant side: Secondary | ICD-10-CM | POA: Diagnosis not present

## 2016-12-13 DIAGNOSIS — J984 Other disorders of lung: Secondary | ICD-10-CM | POA: Diagnosis not present

## 2016-12-13 DIAGNOSIS — J189 Pneumonia, unspecified organism: Secondary | ICD-10-CM | POA: Diagnosis not present

## 2016-12-13 DIAGNOSIS — Z79899 Other long term (current) drug therapy: Secondary | ICD-10-CM | POA: Diagnosis not present

## 2016-12-13 DIAGNOSIS — R41841 Cognitive communication deficit: Secondary | ICD-10-CM | POA: Diagnosis not present

## 2016-12-13 DIAGNOSIS — I130 Hypertensive heart and chronic kidney disease with heart failure and stage 1 through stage 4 chronic kidney disease, or unspecified chronic kidney disease: Secondary | ICD-10-CM | POA: Diagnosis not present

## 2016-12-13 DIAGNOSIS — J9621 Acute and chronic respiratory failure with hypoxia: Secondary | ICD-10-CM | POA: Diagnosis not present

## 2016-12-13 DIAGNOSIS — R042 Hemoptysis: Secondary | ICD-10-CM | POA: Diagnosis not present

## 2016-12-13 DIAGNOSIS — W19XXXA Unspecified fall, initial encounter: Secondary | ICD-10-CM

## 2016-12-13 DIAGNOSIS — J690 Pneumonitis due to inhalation of food and vomit: Secondary | ICD-10-CM | POA: Diagnosis not present

## 2016-12-13 DIAGNOSIS — J188 Other pneumonia, unspecified organism: Secondary | ICD-10-CM | POA: Diagnosis not present

## 2016-12-13 DIAGNOSIS — I2510 Atherosclerotic heart disease of native coronary artery without angina pectoris: Secondary | ICD-10-CM | POA: Diagnosis not present

## 2016-12-13 DIAGNOSIS — E870 Hyperosmolality and hypernatremia: Secondary | ICD-10-CM | POA: Diagnosis not present

## 2016-12-13 DIAGNOSIS — R2689 Other abnormalities of gait and mobility: Secondary | ICD-10-CM | POA: Diagnosis not present

## 2016-12-13 DIAGNOSIS — J40 Bronchitis, not specified as acute or chronic: Secondary | ICD-10-CM | POA: Diagnosis present

## 2016-12-13 DIAGNOSIS — Z9911 Dependence on respirator [ventilator] status: Secondary | ICD-10-CM | POA: Diagnosis not present

## 2016-12-13 DIAGNOSIS — F419 Anxiety disorder, unspecified: Secondary | ICD-10-CM | POA: Diagnosis not present

## 2016-12-13 DIAGNOSIS — I5022 Chronic systolic (congestive) heart failure: Secondary | ICD-10-CM | POA: Diagnosis not present

## 2016-12-13 LAB — CBC WITH DIFFERENTIAL/PLATELET
BASOS ABS: 0 10*3/uL (ref 0.0–0.1)
Basophils Relative: 0 %
Eosinophils Absolute: 0.6 10*3/uL (ref 0.0–0.7)
Eosinophils Relative: 5 %
HEMATOCRIT: 41.1 % (ref 39.0–52.0)
Hemoglobin: 13.4 g/dL (ref 13.0–17.0)
LYMPHS PCT: 21 %
Lymphs Abs: 2.3 10*3/uL (ref 0.7–4.0)
MCH: 33.1 pg (ref 26.0–34.0)
MCHC: 32.6 g/dL (ref 30.0–36.0)
MCV: 101.5 fL — AB (ref 78.0–100.0)
Monocytes Absolute: 0.6 10*3/uL (ref 0.1–1.0)
Monocytes Relative: 6 %
NEUTROS ABS: 7.5 10*3/uL (ref 1.7–7.7)
Neutrophils Relative %: 68 %
Platelets: 216 10*3/uL (ref 150–400)
RBC: 4.05 MIL/uL — AB (ref 4.22–5.81)
RDW: 15.9 % — ABNORMAL HIGH (ref 11.5–15.5)
WBC: 11.1 10*3/uL — AB (ref 4.0–10.5)

## 2016-12-13 LAB — CBC
HEMATOCRIT: 41 % (ref 39.0–52.0)
Hemoglobin: 13.3 g/dL (ref 13.0–17.0)
MCH: 33.1 pg (ref 26.0–34.0)
MCHC: 32.4 g/dL (ref 30.0–36.0)
MCV: 102 fL — ABNORMAL HIGH (ref 78.0–100.0)
PLATELETS: 180 10*3/uL (ref 150–400)
RBC: 4.02 MIL/uL — ABNORMAL LOW (ref 4.22–5.81)
RDW: 15.9 % — AB (ref 11.5–15.5)
WBC: 11.4 10*3/uL — ABNORMAL HIGH (ref 4.0–10.5)

## 2016-12-13 LAB — GLUCOSE, CAPILLARY
Glucose-Capillary: 154 mg/dL — ABNORMAL HIGH (ref 65–99)
Glucose-Capillary: 220 mg/dL — ABNORMAL HIGH (ref 65–99)

## 2016-12-13 LAB — PROTIME-INR
INR: 1.18
INR: 1.12
Prothrombin Time: 15 seconds (ref 11.4–15.2)
Prothrombin Time: 14.5 seconds (ref 11.4–15.2)

## 2016-12-13 LAB — BASIC METABOLIC PANEL
Anion gap: 9 (ref 5–15)
BUN: 60 mg/dL — AB (ref 6–20)
CALCIUM: 8.3 mg/dL — AB (ref 8.9–10.3)
CO2: 28 mmol/L (ref 22–32)
CREATININE: 2.3 mg/dL — AB (ref 0.61–1.24)
Chloride: 109 mmol/L (ref 101–111)
GFR calc Af Amer: 34 mL/min — ABNORMAL LOW (ref >60)
GFR, EST NON AFRICAN AMERICAN: 29 mL/min — AB (ref >60)
GLUCOSE: 153 mg/dL — AB (ref 65–99)
Potassium: 4.3 mmol/L (ref 3.5–5.1)
Sodium: 146 mmol/L — ABNORMAL HIGH (ref 135–145)

## 2016-12-13 LAB — HEPARIN LEVEL (UNFRACTIONATED)
HEPARIN UNFRACTIONATED: 0.35 [IU]/mL (ref 0.30–0.70)
HEPARIN UNFRACTIONATED: 0.28 [IU]/mL — AB (ref 0.30–0.70)

## 2016-12-13 MED ORDER — HYDRALAZINE HCL 25 MG PO TABS: 12.5000 mg | ORAL_TABLET | Freq: Two times a day (BID) | ORAL | Status: DC

## 2016-12-13 MED ORDER — INSULIN ASPART 100 UNIT/ML ~~LOC~~ SOLN: SUBCUTANEOUS | 12 refills | Status: DC

## 2016-12-13 MED ORDER — ENOXAPARIN SODIUM 100 MG/ML ~~LOC~~ SOLN: 110.0000 mg | Freq: Two times a day (BID) | SUBCUTANEOUS | Status: DC

## 2016-12-13 MED ORDER — POTASSIUM CHLORIDE CRYS ER 20 MEQ PO TBCR: 20.0000 meq | EXTENDED_RELEASE_TABLET | Freq: Every day | ORAL | Status: DC

## 2016-12-13 MED ORDER — BISACODYL 10 MG RE SUPP: 10.0000 mg | Freq: Two times a day (BID) | RECTAL | 0 refills | Status: DC

## 2016-12-13 MED ORDER — AMOXICILLIN-POT CLAVULANATE 875-125 MG PO TABS: 1.0000 | ORAL_TABLET | Freq: Two times a day (BID) | ORAL | Status: DC

## 2016-12-13 MED ORDER — OXYCODONE HCL 5 MG PO TABA: 5.0000 mg | ORAL_TABLET | ORAL | 0 refills | Status: DC | PRN

## 2016-12-13 MED ORDER — WARFARIN SODIUM 5 MG PO TABS: 5.0000 mg | ORAL_TABLET | Freq: Once | ORAL | Status: DC

## 2016-12-13 MED ORDER — AMOXICILLIN-POT CLAVULANATE 875-125 MG PO TABS
1.0000 | ORAL_TABLET | Freq: Two times a day (BID) | ORAL | Status: DC
  Administered 2016-12-13: 1 via ORAL
  Filled 2016-12-13: qty 1

## 2016-12-13 MED ORDER — ENOXAPARIN SODIUM 120 MG/0.8ML ~~LOC~~ SOLN
110.0000 mg | Freq: Two times a day (BID) | SUBCUTANEOUS | Status: DC
  Administered 2016-12-13: 110 mg via SUBCUTANEOUS
  Filled 2016-12-13: qty 0.73

## 2016-12-13 MED ORDER — ASPIRIN 81 MG PO TBEC: 81.0000 mg | DELAYED_RELEASE_TABLET | Freq: Every day | ORAL | Status: DC

## 2016-12-13 MED ORDER — SENNOSIDES-DOCUSATE SODIUM 8.6-50 MG PO TABS: 1.0000 | ORAL_TABLET | Freq: Every day | ORAL | Status: DC

## 2016-12-13 MED ORDER — LIDOCAINE 5 % EX PTCH: 1.0000 | MEDICATED_PATCH | CUTANEOUS | 0 refills | Status: DC

## 2016-12-13 NOTE — Discharge Summary (Addendum)
Curtis Keller WUJ:[id number] DOB: 1955/12/13 DOA: 11/26/2016  PCP: Kirt Boys, DO  Admit date: 11/26/2016  Discharge date: 12/13/2016  Admitted From:  SNF Disposition:  LTAC   Recommendations for Outpatient Follow-up:   Follow up with PCP in 1-2 weeks  PCP Please obtain BMP/CBC, 2 view CXR in 1week,  (see Discharge instructions)   PCP Please follow up on the following pending results: Monitor INR closely stop Lovenox once INR reaches 2   Home Health: None   Equipment/Devices: None  Consultations: Pall Care, Pulmonary Discharge Condition: Guarded   CODE STATUS: No Intubation or CPR, IV Meds and BiPAP OK   Diet Recommendation: Dysphagia 3 diet with nectar thick liquids, full feeding assistance and aspiration precautions   Chief Complaint  Patient presents with  . Hip Pain      Brief history of present illness from the day of admission and additional interim summary     Patient is a 61 y.o. male with history chronic systolic heart failure (EF 25-30%), history of LV thrombus, prior CVA with chronic left-sided hemiplegia-multiple falls over the past few years causing splenic rupture-presented to the hospital on 2/3 following a mechanical fall, found to have a left hip fracture. Unfortunately hospital course has been complicated by development of acute hypoxic respiratory failure, felt due to aspiration PNA vs acute systolic CHF, worsening renal function and hyponatremia. After extensive discussion with patient, family-he is now a DO NOT RESUSCITATE. Palliative care continues to follow and engage with family, however on 2/17, family has decided to revoke the DO NOT RESUSCITATE.                                                                  Hospital Course   Closed Left hip fracture: Secondary to a  mechanical fall-unfortunately before hip repair could be attempted-patient developed hypoxic resp failure due to aspiration pneumonia and acute systolic heart failure. Seen by orthopedics, currently medical management per orthopedics due to his medical issues and very high risk for adverse cardiopulmonary outcome during surgery. Non weight bearing L Leg. Seen by Dr. Floria Raveling admission.   Acute hypoxemic respiratory failure secondary to aspiration pneumonia and acute on chronic systolic heart failure: Patient had a aspiration event on 2/5 requiring transfer to stepdown and 100% nonrebreather mask. He was subsequently treated with IV antibiotics and IV Lasix. He is now on high flow nasal cannula, continue antibiotics and diuresis along with supportive care, still lethargic and overall weak, still remains very high risk for future aspiration.  Seen by speech and pulmonary who concur that his long-term prognosis is poor, patient is limited code with no CPR or intubation but okay for BiPAP or IV medications in the future. At this time will be discharged on oral antibiotics, diuretics and nasal cannula oxygen to  SNF. He is currently on seizure 3 diet with nectar thick liquids with full feeding assistance and aspiration precautions. Oral antibiotics for 4 more days.   Goal of treatment should be gentle medical treatment with comfort in mind, involve palliative care . If declines further consider full comfort care.   Acute renal failure: Due to acute systolic CHF decompensation, volume status better resume home dose diuretic.  Hypernatremia:  improved, monitor on low-dose diuretic.  Dysphagia: Speech therapy following, underwent modified barium swallow-this is a chronic issue-patient has a prior history of CVA-he previously had a tube in place. Recommendations are to try a dysphagia 3 solids with nectar thick liquids. Requires continued speech help at SNF. Remains high risk for future  aspiration.  History of left apical thrombus: Was on Coumadin, Coumadin was held as initially surgery was anticipated, currently on Lovenox and Coumadin overlap, stop Lovenox once INR reaches 2, remains at risk for falls and bleeding, but this risk has been present before, sister wants anticoagulation which will be continued, defer to PCP on long-term anticoagulation.  Acute metabolic encephalopathy/dementia: Supportive care, remains at risk for delirium.  Leukocytosis: Due to pneumonia continue antibiotics and monitor. Oral antibiotics for 4 more days.  Hx of COPD: not in exacerbation-see above.   History of CVA with chronic left sided weakness: Previously on anticoagulation-left-sided deficits are unchanged. Continue statin. See above regarding anticoagulation  GERD: Continue Pepcid  Type 2 diabetes:  continue sliding scale insulin before every meal CHS monitor CBGs every before meals and at bedtime.  History of anxiety/depression/mild dementia: Continue Depakote, fluoxetine and BuSpar.  Frequent falls: Has had frequent falls in the past few years-one episode of fall resulted in a splenic laceration requiring splenectomy. Unfortunately, patient also has a history of LV thrombus and a CVA with left hemiplegia. Previous M.D. had detailed discussions about risks and benefits with patient and sister who for no one to continue anticoagulation, he was on Coumadin prior to this admission, for now Lovenox + coumadin bridge.  Constipation. Placed on bowel regimen will monitor.   Palliative care: Overall prognosis is extremely poor due to recurrent aspiration, underlying left-sided hemiplegia and dysphagia, recurrent falls, mural thrombus, falls with history of anticoagulation, hypoxic respiratory failure. Family initially wanted to make patient DO NOT RESUSCITATE but was then changed to full code. Palliative care following prognosis remains poor, critical care also recommends against  intubation and full code and they agreed that his prognosis is poor.   Detailed discussion with family on 12/11/2016, patient is now DO NOT RESUSCITATE except they still want gentle medications, IV antibiotics and BiPAP if needed.   Moderate PCM - supportive care.    Discharge diagnosis     Principal Problem:   Closed nondisplaced intertrochanteric fracture of left femur (HCC) Active Problems:   Heart failure, chronic systolic (HCC)   Hypertension   Protein calorie malnutrition (HCC)   S/P splenectomy   Aspiration pneumonia (HCC)   Hyperlipidemia   Chronic anticoagulation   COPD (chronic obstructive pulmonary disease) (HCC)   GERD (gastroesophageal reflux disease)   Diabetes mellitus, type II, insulin dependent (HCC)   Fall at nursing home   Acute and chronic respiratory failure with hypoxia (HCC)   Acute systolic congestive heart failure (HCC)   Ischemic cardiomyopathy   Goals of care, counseling/discussion   Palliative care encounter   DNR (do not resuscitate)   Encounter for hospice care discussion    Discharge instructions    Discharge Instructions    Discharge instructions  Complete by:  As directed    Follow with Primary MD Kirt Boys, DO in 7 days   Get CBC, CMP, INR, 2 view Chest X ray checked  By SNF MD in 2 days ( we routinely change or add medications that can affect your baseline labs and fluid status, therefore we recommend that you get the mentioned basic workup next visit with your PCP, your PCP may decide not to get them or add new tests based on their clinical decision)  Activity: Non weight bearing L Leg.  As tolerated with Full fall precautions use walker/cane & assistance as needed   Disposition LTAC  Diet:   DIET DYS 3 Fluid consistency: Nectar Thick with feeding assistance and aspiration precautions.  For Heart failure patients - Check your Weight same time everyday, if you gain over 2 pounds, or you develop in leg swelling, experience  more shortness of breath or chest pain, call your Primary MD immediately. Follow Cardiac Low Salt Diet and 1.5 lit/day fluid restriction.  On your next visit with your primary care physician please Get Medicines reviewed and adjusted.  Please request your Prim.MD to go over all Hospital Tests and Procedure/Radiological results at the follow up, please get all Hospital records sent to your Prim MD by signing hospital release before you go home.  If you experience worsening of your admission symptoms, develop shortness of breath, life threatening emergency, suicidal or homicidal thoughts you must seek medical attention immediately by calling 911 or calling your MD immediately  if symptoms less severe.  You Must read complete instructions/literature along with all the possible adverse reactions/side effects for all the Medicines you take and that have been prescribed to you. Take any new Medicines after you have completely understood and accpet all the possible adverse reactions/side effects.   Do not drive, operate heavy machinery, perform activities at heights, swimming or participation in water activities or provide baby sitting services if your were admitted for syncope or siezures until you have seen by Primary MD or a Neurologist and advised to do so again.  Do not drive when taking Pain medications.    Do not take more than prescribed Pain, Sleep and Anxiety Medications  Special Instructions: If you have smoked or chewed Tobacco  in the last 2 yrs please stop smoking, stop any regular Alcohol  and or any Recreational drug use.  Wear Seat belts while driving.   Please note  You were cared for by a hospitalist during your hospital stay. If you have any questions about your discharge medications or the care you received while you were in the hospital after you are discharged, you can call the unit and asked to speak with the hospitalist on call if the hospitalist that took care of you is not  available. Once you are discharged, your primary care physician will handle any further medical issues. Please note that NO REFILLS for any discharge medications will be authorized once you are discharged, as it is imperative that you return to your primary care physician (or establish a relationship with a primary care physician if you do not have one) for your aftercare needs so that they can reassess your need for medications and monitor your lab values.   Increase activity slowly    Complete by:  As directed       Discharge Medications   Allergies as of 12/13/2016      Reactions   Codeine Anaphylaxis, Nausea And Vomiting   Hydrocodone Other (See  Comments)   On MAR   Hydrocodone-acetaminophen Nausea And Vomiting   Tegaderm Ag Mesh [silver] Other (See Comments)   On MAR   Lisinopril Itching, Rash   Tylenol [acetaminophen] Nausea Only      Medication List    STOP taking these medications   HUMALOG KWIKPEN 100 UNIT/ML KiwkPen Generic drug:  insulin lispro   ibuprofen 200 MG tablet Commonly known as:  ADVIL,MOTRIN   LORazepam 0.5 MG tablet Commonly known as:  ATIVAN     TAKE these medications   amoxicillin-clavulanate 875-125 MG tablet Commonly known as:  AUGMENTIN Take 1 tablet by mouth every 12 (twelve) hours. 5 more days   atorvastatin 40 MG tablet Commonly known as:  LIPITOR Take 40 mg by mouth at bedtime.   BIOFREEZE EX Apply 1 application topically daily as needed (pain).   bisacodyl 10 MG suppository Commonly known as:  DULCOLAX Place 1 suppository (10 mg total) rectally 2 (two) times daily.   busPIRone 10 MG tablet Commonly known as:  BUSPAR Take 10 mg by mouth 2 (two) times daily.   divalproex 125 MG DR tablet Commonly known as:  DEPAKOTE Take 125 mg by mouth 3 (three) times daily.   enoxaparin 100 MG/ML injection Commonly known as:  LOVENOX Inject 1.1 mLs (110 mg total) into the skin every 12 (twelve) hours. Stop once INR reaches 2   famotidine 20  MG tablet Commonly known as:  PEPCID Take 20 mg by mouth 2 (two) times daily.   feeding supplement (PRO-STAT SUGAR FREE 64) Liqd Take 60 mLs by mouth 2 (two) times daily. Pro Stat Awc 17-100 gram-kcal   FISH OIL PO Take 1 g by mouth daily. Reported on 03/10/2016   FLUoxetine HCl 60 MG Tabs Take 1 tablet by mouth daily.   hydrALAZINE 25 MG tablet Commonly known as:  APRESOLINE Take 0.5 tablets (12.5 mg total) by mouth 2 times daily at 12 noon and 4 pm.   insulin aspart 100 UNIT/ML injection Commonly known as:  NOVOLOG Before each meal 3 times a day, 140-199 - 2 units, 200-250 - 4 units, 251-299 - 6 units,  300-349 - 8 units,  350 or above 10 units. Dispense syringes and needles as needed, Ok to switch to PEN if approved. Substitute to any brand approved. DX DM2, Code E11.65   ipratropium-albuterol 0.5-2.5 (3) MG/3ML Soln Commonly known as:  DUONEB Take 3 mLs by nebulization 2 (two) times daily.   lidocaine 5 % Commonly known as:  LIDODERM Place 1 patch onto the skin daily. Remove & Discard patch within 12 hours or as directed by MD   metoprolol succinate 25 MG 24 hr tablet Commonly known as:  TOPROL-XL Take 25 mg by mouth 2 (two) times daily.   OxyCODONE HCl (Abuse Deter) 5 MG Taba Commonly known as:  OXAYDO Take 5 mg by mouth every 4 (four) hours as needed.   OXYGEN Inhale 3 L into the lungs daily as needed (SOB).   potassium chloride SA 20 MEQ tablet Commonly known as:  K-DUR,KLOR-CON Take 1 tablet (20 mEq total) by mouth daily.   pregabalin 75 MG capsule Commonly known as:  LYRICA Take 75 mg by mouth 2 (two) times daily.   SENNA LAX PO Take 2 tablets by mouth 2 (two) times daily.   torsemide 10 MG tablet Commonly known as:  DEMADEX Take 10 mg by mouth daily.   vitamin B-12 1000 MCG tablet Commonly known as:  CYANOCOBALAMIN Take 1,000 mcg by mouth daily.  vitamin C 500 MG tablet Commonly known as:  ASCORBIC ACID Take 500 mg by mouth 2 (two) times daily.  10,22   warfarin 4 MG tablet Commonly known as:  COUMADIN Take 3.5 mg by mouth at bedtime. Every evening   zinc sulfate 220 (50 Zn) MG capsule Take 220 mg by mouth daily.       Follow-up Information    Kirt Boys, DO. Schedule an appointment as soon as possible for a visit in 1 week(s).   Specialty:  Internal Medicine Contact information: [address] [place] [address] [phone]           Major procedures and Radiology Reports - PLEASE review detailed and final reports thoroughly  -     Echo 2/5 - - Very limited image quality, however LVEF is severely impaired,estimated at 25-30% with akinesis of all of the apical segmentsand mid anteroseptal, anterior and anterolateral segments.There is no thrombus on the contrast images. A cardiac MRI is recommended for further LVEF evaluation.   Dg Chest 1 View  Result Date: 11/27/2016 CLINICAL DATA:  Larey Seat today.  Left hip fracture. EXAM: CHEST 1 VIEW COMPARISON:  02/24/2016 FINDINGS: Diffuse interstitial coarsening is probably chronic. No consolidation. No effusion. No pneumothorax. New unchanged hilar, mediastinal and cardiac contours. IMPRESSION: Chronic appearing interstitial coarsening. No consolidation or effusion. Electronically Signed   By: Ellery Plunk M.D.   On: 11/27/2016 01:14   Dg Pelvis 1-2 Views  Result Date: 11/27/2016 CLINICAL DATA:  Larey Seat today while trying to get out of bed unassisted. EXAM: PELVIS - 1-2 VIEW COMPARISON:  None. FINDINGS: There is an acute subcapital left hip fracture. No dislocation. No bone lesion or bony destruction. Pelvis appears intact. IMPRESSION: Subcapital left hip fracture Electronically Signed   By: Ellery Plunk M.D.   On: 11/27/2016 01:13   Ct Head Wo Contrast  Result Date: 11/27/2016 CLINICAL DATA:  Larey Seat getting out of bed today, acute LEFT hip fracture. History of hypertension, stroke. EXAM: CT HEAD WITHOUT CONTRAST TECHNIQUE: Contiguous axial images were obtained  from the base of the skull through the vertex without intravenous contrast. COMPARISON:  CT HEAD January 26, 2016 FINDINGS: BRAIN: No intraparenchymal hemorrhage, mass effect, midline shift or acute large vascular territory infarcts. Old RIGHT greater than LEFT cerebellar infarcts, severe brachium contrast atrophy. Old RIGHT greater than LEFT pontine infarcts. Old LEFT thalamus and bilateral basal ganglia lacunar infarcts. Old small RIGHT occipital lobe infarct. LEFT inferior temporal lobe encephalomalacia is likely posttraumatic. Mild to moderate white matter changes compatible with chronic small vessel ischemic disease. No abnormal extra-axial fluid collection. Moderate ventriculomegaly on the basis of global parenchymal brain volume loss. VASCULAR: Mild to moderate calcific atherosclerosis of the carotid siphons. SKULL: No skull fracture. No significant scalp soft tissue swelling. SINUSES/ORBITS: Bilateral maxillary mucosal retention cysts. Mastoid air cells are well aerated. The included ocular globes and orbital contents are non-suspicious. OTHER: Patient is edentulous. IMPRESSION: No acute intracranial process. Stable examination including numerous old supra- and infratentorial infarcts predominately within posterior circulation, moderate global brain atrophy. Electronically Signed   By: Awilda Metro M.D.   On: 11/27/2016 03:01   Dg Chest Port 1 View  Result Date: 12/11/2016 CLINICAL DATA:  Pneumonia EXAM: PORTABLE CHEST 1 VIEW COMPARISON:  December 10, 2016 FINDINGS: Interstitial and patchy alveolar edema are again noted. Focal airspace consolidation in the left lower lobe is slightly less apparent compared to 1 day prior. There is new atelectatic change in the right mid lung. There is  cardiomegaly. There is a mild degree of pulmonary venous hypertension. There is equivocal right pleural effusion. No adenopathy. No pneumothorax. Old healed fracture right clavicle. IMPRESSION: Persistent changes of  congestive heart failure. Less airspace consolidation left base compared to 1 day prior. The right midlung atelectasis. Stable cardiac silhouette. Electronically Signed   By: Bretta Bang III M.D.   On: 12/11/2016 07:16   Dg Chest Port 1 View  Result Date: 12/10/2016 CLINICAL DATA:  Fever and shortness of breath EXAM: PORTABLE CHEST 1 VIEW COMPARISON:  December 08, 2016 FINDINGS: There remains interstitial and patchy alveolar edema. There is focal airspace consolidation in the left lower lobe. There is cardiomegaly with mild pulmonary venous hypertension. There is a a rather minimal right pleural effusion. No adenopathy. No pneumothorax. There is an old healed fracture of the right clavicle. IMPRESSION: Findings felt to be most indicative of a degree of congestive heart failure. Superimposed consolidation in the left base may represent pneumonia or aspiration pneumonitis. Pneumonitis and congestive heart failure may present concurrently. Cardiomegaly is stable. Old healed fracture right clavicle. No evident pneumothorax. Electronically Signed   By: Bretta Bang III M.D.   On: 12/10/2016 07:30   Dg Chest Port 1 View  Result Date: 12/08/2016 CLINICAL DATA:  Chronic CHF, aspiration pneumonia, underlying COPD. EXAM: PORTABLE CHEST 1 VIEW COMPARISON:  Portable chest x-ray of December 07, 2016 FINDINGS: The lungs are slightly less well inflated today. The pulmonary interstitial markings remain increased with areas patchy confluence noted. There is no large pleural effusion. The cardiac silhouette remains enlarged. The pulmonary vascularity remains prominent centrally. There is calcification in the wall of the aortic arch. There is old deformity of the midshaft of the right clavicle. IMPRESSION: Decreased aeration of both lungs today which accentuates the interstitial markings. Persistent bilateral interstitial and alveolar opacities likely reflect pneumonia though mild CHF is suspected as well.  Electronically Signed   By: David  [place] M.D.   On: 12/08/2016 07:19   Dg Chest Port 1 View  Result Date: 12/07/2016 CLINICAL DATA:  61 year old male with shortness of breath. Recent fall. Initial encounter. EXAM: PORTABLE CHEST 1 VIEW COMPARISON:  12/06/2016 and earlier. FINDINGS: Portable AP semi upright view at 0515 hours. Stable cardiomegaly and mediastinal contours. Widespread increased pulmonary interstitial markings are chronic to a degree. No superimposed pneumothorax or pulmonary edema. No pleural effusion is evident. Increased patchy retrocardiac opacity since 11/27/2016. Chronic right clavicle deformity. IMPRESSION: Cardiomegaly and chronic lung disease. Left lung base opacity has increased since 11/27/2016, suspicious for left lung base pneumonia. No pleural effusion identified. Electronically Signed   By: Odessa Fleming M.D.   On: 12/07/2016 07:43   Dg Chest Port 1 View  Result Date: 12/06/2016 CLINICAL DATA:  Shortness of breath . EXAM: PORTABLE CHEST 1 VIEW COMPARISON:  12/05/2016. FINDINGS: Cardiomegaly with progressive bilateral pulmonary infiltrates consistent with congestive heart failure and pulmonary edema. Bilateral pneumonia cannot be excluded . Small right pleural effusion cannot be excluded. No pneumothorax . Healed right clavicular fracture. IMPRESSION: 1. Cardiomegaly with progressive bilateral pulmonary infiltrates/ pulmonary edema. Findings consistent with congestive heart failure. Bilateral pneumonia cannot be excluded. Small right pleural effusion cannot be excluded. 2.  Low lung volumes. Electronically Signed   By: Maisie Fus  Register   On: 12/06/2016 06:50   Dg Chest Port 1 View  Result Date: 12/05/2016 CLINICAL DATA:  Shortness of breath . EXAM: PORTABLE CHEST 1 VIEW COMPARISON:  12/04/2016 . FINDINGS: Cardiomegaly with bilateral from interstitial prominence. No change from prior  exam. No pleural effusion or pneumothorax P IMPRESSION: Cardiomegaly with bilateral mild interstitial  prominence consistent interstitial edema again noted. No interim change from prior exam. Electronically Signed   By: Maisie Fus  Register   On: 12/05/2016 07:23   Dg Chest Port 1 View  Result Date: 12/04/2016 CLINICAL DATA:  Shortness of Breath EXAM: PORTABLE CHEST 1 VIEW COMPARISON:  12/03/2016 FINDINGS: Cardiomegaly. Mild interstitial prominence and vascular congestion. Question mild interstitial edema. No effusions or acute bony abnormality. IMPRESSION: Vascular congestion with interstitial prominence, question mild interstitial edema. Electronically Signed   By: Charlett Nose M.D.   On: 12/04/2016 07:11   Dg Chest Port 1 View  Result Date: 12/03/2016 CLINICAL DATA:  Shortness of Breath EXAM: PORTABLE CHEST 1 VIEW COMPARISON:  12/02/2016 FINDINGS: Cardiac shadow is enlarged. Lungs are well aerated bilaterally. Vascular congestion is noted without significant interstitial edema. No sizable effusion is noted. No bony abnormality is seen. IMPRESSION: Stable vascular congestion. Electronically Signed   By: Alcide Clever M.D.   On: 12/03/2016 15:20   Dg Chest Port 1 View  Result Date: 12/02/2016 CLINICAL DATA:  Dyspnea EXAM: PORTABLE CHEST 1 VIEW COMPARISON:  Chest radiograph from one day prior. FINDINGS: Slightly right rotated chest radiograph. Stable cardiomediastinal silhouette with mild cardiomegaly. No pneumothorax. No pleural effusion. Mild pulmonary edema, decreased. IMPRESSION: Mild congestive heart failure, improved. Electronically Signed   By: Delbert Phenix M.D.   On: 12/02/2016 08:06   Dg Chest Port 1 View  Addendum Date: 12/01/2016   ADDENDUM REPORT: 12/01/2016 08:08 ADDENDUM: There is an old healed right clavicle fracture, stable. Electronically Signed   By: Bretta Bang III M.D.   On: 12/01/2016 08:08   Result Date: 12/01/2016 CLINICAL DATA:  Shortness of Breath EXAM: PORTABLE CHEST 1 VIEW COMPARISON:  November 30, 2016 FINDINGS: Extensive airspace consolidation bilaterally, most severe in  the left upper to mid lung zones, up remains without significant change. There is cardiomegaly with pulmonary venous hypertension. No adenopathy evident. No bone lesions. IMPRESSION: Widespread airspace consolidation bilaterally, somewhat more on the left than on the right. Evidence a degree of underlying pulmonary vascular congestion. Question multifocal pneumonia versus pulmonary edema; both entities may well exist concurrently. The overall appearance of the lungs and cardiomediastinal silhouette is stable compared to 1 day prior. Electronically Signed: By: Bretta Bang III M.D. On: 12/01/2016 08:02   Dg Chest Port 1 View  Result Date: 11/28/2016 CLINICAL DATA:  Shortness of breath. EXAM: PORTABLE CHEST 1 VIEW COMPARISON:  11/27/2016 and 02/24/2016 FINDINGS: The patient has progressive bilateral pulmonary infiltrates which may represent pulmonary edema or progressive pneumonia. Pulmonary vascularity appears normal in the overall heart size is normal. IMPRESSION: Probable progressive bilateral pneumonia. Electronically Signed   By: Francene Boyers M.D.   On: 11/28/2016 15:31   Dg Chest Port 1 View  Result Date: 11/27/2016 CLINICAL DATA:  sob EXAM: PORTABLE CHEST 1 VIEW COMPARISON:  11/27/2016 FINDINGS: The heart is enlarged. There are airspace filling opacities bilaterally, particularly well seen at the left lung base. Patient is rotated. IMPRESSION: Bilateral airspace filling opacities consistent with pulmonary edema and/or infectious process. Electronically Signed   By: Norva Pavlov M.D.   On: 11/27/2016 22:17   Dg Chest Port 1v Same Day  Result Date: 11/30/2016 CLINICAL DATA:  Shortness of breath, cough today, pneumonia, history hypertension, prior respiratory failure, coronary disease, chronic systolic CHF, ischemic cardiomyopathy EXAM: PORTABLE CHEST 1 VIEW COMPARISON:  Portable exam 0922 hours compared to 11/28/2016 FINDINGS: Rotated to the LEFT  with kyphotic positioning. Enlargement of  cardiac silhouette. Stable mediastinal contours for degree of rotation. Extensive BILATERAL pulmonary infiltrates favor pneumonia over edema. No pleural effusion or pneumothorax. Bones unremarkable. IMPRESSION: Persistent BILATERAL pulmonary infiltrates question pneumonia. Enlargement of cardiac silhouette. Electronically Signed   By: Ulyses Southward M.D.   On: 11/30/2016 09:31   Dg Swallowing Func-speech Pathology  Result Date: 11/30/2016 Objective Swallowing Evaluation: Type of Study: MBS-Modified Barium Swallow Study Patient Details Name: AZARIAH BONURA MRN: [id number] Date of Birth: 22-Jun-1956 Today's Date: 11/30/2016 Time: SLP Start Time (ACUTE ONLY): 1310-SLP Stop Time (ACUTE ONLY): 1325 SLP Time Calculation (min) (ACUTE ONLY): 15 min Past Medical History: Past Medical History: Diagnosis Date . Acute respiratory failure (HCC)   a. Spring 2017 following fall/splenectomy -->admitted to [facility]-->trach/g tube. . Anemia   a. 12/2015 ABL in setting of fall/hematomas/splenic laceration req splenectomy. Marland Kitchen Apical mural thrombus   a. 11/2015 Echo: EF 25-30%, moderate size apical thrombus-->coumadin;  b. 12/2015 f/u Echo: EF 25-30%, ant/septa HK, no obvious large thrombus but cannot exclude small mural thrombus. Marland Kitchen CAD (coronary artery disease)   a. 12/2014 s/p BMS to the RCA. Marland Kitchen Cerebral infarction (HCC)  . Chronic systolic CHF (congestive heart failure) (HCC)   a. 12/2015 Echo: EF 25-30%. . Dementia  . Depression  . Dysphagia  . Falls   a. 11/2015 traumatic fall with resultant trauma req splenectomy and prolonged hospitalization complicated by resp failure. Marland Kitchen History of pneumonia  . Hyperlipidemia  . Hypertension  . Ischemic cardiomyopathy   a. 12/2015 Echo: EF 25-30%, anterior and septal HK. Marland Kitchen Left hemiplegia (HCC)  . Protein calorie malnutrition (HCC)  . Respiratory failure (HCC)  . Spleen injury  . Status post tracheostomy (HCC)   a. 01/2016 in setting of ongoing resp failure and aspiration. Past Surgical History:  Past Surgical History: Procedure Laterality Date . CARDIAC CATHETERIZATION   . CORONARY ANGIOPLASTY WITH STENT PLACEMENT   . IR GENERIC HISTORICAL  07/22/2016  IR GASTROSTOMY TUBE REMOVAL 07/22/2016 [name], MD [facility] . PR LAP, SPLENECTOMY  01/07/2016 . TRACHEOSTOMY TUBE PLACEMENT N/A 02/22/2016  Procedure: TRACHEOSTOMY;  Surgeon: [name], MD;  Location: [facility] OR;  Service: ENT;  Laterality: N/A; HPI: Pt. with PMH of HTN, chronic systolic CHF, chronic respiratory failure on 3L of oxygen, diabetes mellitus, recurrent fall, chronic anticoagulation, dyslipidemia; admitted on 11/26/2016, with complaint of A mechanical fall, was found to have closed left hip fracture.Patient developed aspiration pneumonia in the hospital. Currently surgery is on hold until pneumonia is better. Per MD pt was on dys 2/nectar thick liquids at SNF, but has been on thin liquids here. Pt had an MBS during admission to [facility] in 2017, but record not available. Pt also had a trach at that time. Reproted has a history of dysphagia due to prior CVAs.  No Data Recorded Assessment / Plan / Recommendation CHL IP CLINICAL IMPRESSIONS 11/30/2016 Therapy Diagnosis Severe pharyngeal phase dysphagia Clinical Impression Pt demonstrates a severe oropharygneal dysphagia with decreased duration and magnitude of hyolaryngeal excursion leading to severe oropharyngeal residuals that are aspirated with and without sensation in further swallow attempts. Base of tongue propulsion of bolus is also poor and epiglottis does not deflect for airway protection. Thin liquids pose a severe risk of aspiration and nectar thick liquids reduce severity of aspiration events, but are still likely to be aspirated due to increased residuals with thick viscosity. A chin tuck significantly improves bolus transit if fully tucked against chest. Would  suspect that carry over for this will be poor, but will trial dys 3 solids and nectar thick liquids with  full supervision for a chin tuck to determine if it is achievable.  Impact on safety and function Severe aspiration risk   CHL IP TREATMENT RECOMMENDATION 11/30/2016 Treatment Recommendations Therapy as outlined in treatment plan below   Prognosis 11/30/2016 Prognosis for Safe Diet Advancement Guarded Barriers to Reach Goals Severity of deficits;Time post onset Barriers/Prognosis Comment -- CHL IP DIET RECOMMENDATION 11/30/2016 SLP Diet Recommendations Dysphagia 3 (Mech soft) solids;Nectar thick liquid Liquid Administration via Cup;Straw Medication Administration Whole meds with puree Compensations Chin tuck;Slow rate;Small sips/bites;Minimize environmental distractions Postural Changes Remain semi-upright after after feeds/meals (Comment);Seated upright at 90 degrees   CHL IP OTHER RECOMMENDATIONS 11/30/2016 Recommended Consults -- Oral Care Recommendations Oral care BID Other Recommendations Order thickener from pharmacy   CHL IP FOLLOW UP RECOMMENDATIONS 11/30/2016 Follow up Recommendations Skilled Nursing facility   [facility] IP FREQUENCY AND DURATION 11/30/2016 Speech Therapy Frequency (ACUTE ONLY) min 2x/week Treatment Duration 2 weeks      CHL IP ORAL PHASE 11/30/2016 Oral Phase WFL Oral - Pudding Teaspoon -- Oral - Pudding Cup -- Oral - Honey Teaspoon -- Oral - Honey Cup -- Oral - Nectar Teaspoon -- Oral - Nectar Cup -- Oral - Nectar Straw -- Oral - Thin Teaspoon -- Oral - Thin Cup -- Oral - Thin Straw -- Oral - Puree -- Oral - Mech Soft -- Oral - Regular -- Oral - Multi-Consistency -- Oral - Pill -- Oral Phase - Comment --  CHL IP PHARYNGEAL PHASE 11/30/2016 Pharyngeal Phase Impaired Pharyngeal- Pudding Teaspoon -- Pharyngeal -- Pharyngeal- Pudding Cup -- Pharyngeal -- Pharyngeal- Honey Teaspoon -- Pharyngeal -- Pharyngeal- Honey Cup -- Pharyngeal -- Pharyngeal- Nectar Teaspoon -- Pharyngeal -- Pharyngeal- Nectar Cup Reduced epiglottic inversion;Reduced anterior laryngeal mobility;Reduced airway/laryngeal closure;Reduced tongue  base retraction;Delayed swallow initiation-pyriform sinuses;Pharyngeal residue - valleculae;Pharyngeal residue - pyriform;Compensatory strategies attempted (with notebox) Pharyngeal -- Pharyngeal- Nectar Straw Reduced epiglottic inversion;Reduced anterior laryngeal mobility;Reduced airway/laryngeal closure;Reduced tongue base retraction;Delayed swallow initiation-pyriform sinuses;Pharyngeal residue - valleculae;Pharyngeal residue - pyriform;Compensatory strategies attempted (with notebox);Other (Comment) Pharyngeal -- Pharyngeal- Thin Teaspoon -- Pharyngeal -- Pharyngeal- Thin Cup Reduced epiglottic inversion;Reduced anterior laryngeal mobility;Reduced airway/laryngeal closure;Reduced tongue base retraction;Delayed swallow initiation-pyriform sinuses;Pharyngeal residue - valleculae;Pharyngeal residue - pyriform;Penetration/Apiration after swallow;Significant aspiration (Amount) Pharyngeal Material enters airway, passes BELOW cords and not ejected out despite cough attempt by patient Pharyngeal- Thin Straw Reduced epiglottic inversion;Reduced anterior laryngeal mobility;Reduced airway/laryngeal closure;Reduced tongue base retraction;Delayed swallow initiation-pyriform sinuses;Pharyngeal residue - valleculae;Pharyngeal residue - pyriform;Compensatory strategies attempted (with notebox);Penetration/Aspiration during swallow Pharyngeal Material enters airway, passes BELOW cords without attempt by patient to eject out (silent aspiration) Pharyngeal- Puree Reduced epiglottic inversion;Reduced anterior laryngeal mobility;Reduced airway/laryngeal closure;Reduced tongue base retraction;Pharyngeal residue - valleculae;Pharyngeal residue - pyriform;Compensatory strategies attempted (with notebox);Delayed swallow initiation-vallecula Pharyngeal -- Pharyngeal- Mechanical Soft Reduced epiglottic inversion;Reduced anterior laryngeal mobility;Reduced airway/laryngeal closure;Reduced tongue base retraction;Pharyngeal residue -  valleculae;Pharyngeal residue - pyriform;Compensatory strategies attempted (with notebox);Delayed swallow initiation-vallecula Pharyngeal -- Pharyngeal- Regular -- Pharyngeal -- Pharyngeal- Multi-consistency -- Pharyngeal -- Pharyngeal- Pill Reduced epiglottic inversion;Reduced anterior laryngeal mobility;Reduced airway/laryngeal closure;Reduced tongue base retraction;Pharyngeal residue - valleculae;Pharyngeal residue - pyriform;Compensatory strategies attempted (with notebox);Delayed swallow initiation-vallecula Pharyngeal -- Pharyngeal Comment --  No flowsheet data found. CHL IP GO 11/29/2016 Functional Assessment Tool Used clinical judgement Functional Limitations Swallowing Swallow Current Status (Z6109) CJ Swallow Goal Status (U0454) CJ Swallow Discharge Status [phone]) (None) Motor Speech Current Status (B1478) (None) Motor Speech Goal Status (  Z6109) (None) Motor Speech Goal Status [phone]) (None) Spoken Language Comprehension Current Status [phone]) (None) Spoken Language Comprehension Goal Status [phone]) (None) Spoken Language Comprehension Discharge Status [phone]) (None) Spoken Language Expression Current Status [phone]) (None) Spoken Language Expression Goal Status [phone]) (None) Spoken Language Expression Discharge Status [phone]) (None) Attention Current Status (M8413) (None) Attention Goal Status (K4401) (None) Attention Discharge Status [phone]) (None) Memory Current Status (D6644) (None) Memory Goal Status (I3474) (None) Memory Discharge Status (Q5956) (None) Voice Current Status (L8756) (None) Voice Goal Status (E3329) (None) Voice Discharge Status [phone]) (None) Other Speech-Language Pathology Functional Limitation [phone]) (None) Other Speech-Language Pathology Functional Limitation Goal Status (T0160) (None) Other Speech-Language Pathology Functional Limitation Discharge Status [phone]) (None) Harlon Ditty, MA CCC-SLP [phone] [place], Riley Nearing 11/30/2016, 2:50 PM              Dg Femur Min 2 Views  Left  Result Date: 11/27/2016 CLINICAL DATA:  Larey Seat while attempting to get out of bed unassisted today. EXAM: LEFT FEMUR 2 VIEWS COMPARISON:  None. FINDINGS: There is a subcapital left hip fracture. Remainder of the femur is intact. No dislocation. No bone lesion or bony destruction. IMPRESSION: Subcapital left hip fracture Electronically Signed   By: Ellery Plunk M.D.   On: 11/27/2016 01:13    Micro Results     Recent Results (from the past 240 hour(s))  Culture, blood (Routine X 2) w Reflex to ID Panel     Status: None (Preliminary result)   Collection Time: 12/10/16  3:31 PM  Result Value Ref Range Status   Specimen Description BLOOD LEFT ANTECUBITAL  Final   Special Requests BOTTLES DRAWN AEROBIC AND ANAEROBIC 5CC EA  Final   Culture NO GROWTH 3 DAYS  Final   Report Status PENDING  Incomplete  Culture, blood (Routine X 2) w Reflex to ID Panel     Status: Abnormal   Collection Time: 12/10/16  3:38 PM  Result Value Ref Range Status   Specimen Description BLOOD RIGHT ANTECUBITAL  Final   Special Requests BOTTLES DRAWN AEROBIC AND ANAEROBIC 5CC EA  Final   Culture  Setup Time   Final    GRAM POSITIVE COCCI IN CLUSTERS IN PEDIATRIC BOTTLE Organism ID to follow CRITICAL RESULT CALLED TO, READ BACK BY AND VERIFIED WITH: T. STONE, PHARM, 12/11/16 AT 1428 BY J FUDESCO    Culture (A)  Final    STAPHYLOCOCCUS SPECIES (COAGULASE NEGATIVE) THE SIGNIFICANCE OF ISOLATING THIS ORGANISM FROM A SINGLE SET OF BLOOD CULTURES WHEN MULTIPLE SETS ARE DRAWN IS UNCERTAIN. PLEASE NOTIFY THE MICROBIOLOGY DEPARTMENT WITHIN ONE WEEK IF SPECIATION AND SENSITIVITIES ARE REQUIRED.    Report Status 12/12/2016 FINAL  Final  Blood Culture ID Panel (Reflexed)     Status: Abnormal   Collection Time: 12/10/16  3:38 PM  Result Value Ref Range Status   Enterococcus species NOT DETECTED NOT DETECTED Final   Listeria monocytogenes NOT DETECTED NOT DETECTED Corrected    Comment: CORRECTED ON 02/19 AT 1138:  PREVIOUSLY REPORTED AS NOT DETECTED CRITICAL RESULT CALLED TO, READ BACK BY AND VERIFIED WITH: T. STONE, PHARM, 12/11/16 AT 1428 BY J FUDESCO   Staphylococcus species DETECTED (A) NOT DETECTED Final    Comment: Methicillin (oxacillin) resistant coagulase negative staphylococcus. Possible blood culture contaminant (unless isolated from more than one blood culture draw or clinical case suggests pathogenicity). No antibiotic treatment is indicated for blood  culture contaminants. CRITICAL RESULT CALLED TO, READ BACK BY AND VERIFIED WITH: T. STONE, PHARM, 12/11/16 AT 1428 BY J  FUDESCO    Staphylococcus aureus NOT DETECTED NOT DETECTED Final   Methicillin resistance DETECTED (A) NOT DETECTED Final    Comment: CRITICAL RESULT CALLED TO, READ BACK BY AND VERIFIED WITH: T. STONE, PHARM, 12/11/16 AT 1428 BY J FUDESCO    Streptococcus species NOT DETECTED NOT DETECTED Final   Streptococcus agalactiae NOT DETECTED NOT DETECTED Final   Streptococcus pneumoniae NOT DETECTED NOT DETECTED Final   Streptococcus pyogenes NOT DETECTED NOT DETECTED Final   Acinetobacter baumannii NOT DETECTED NOT DETECTED Final   Enterobacteriaceae species NOT DETECTED NOT DETECTED Final   Enterobacter cloacae complex NOT DETECTED NOT DETECTED Final   Escherichia coli NOT DETECTED NOT DETECTED Final   Klebsiella oxytoca NOT DETECTED NOT DETECTED Final   Klebsiella pneumoniae NOT DETECTED NOT DETECTED Final   Proteus species NOT DETECTED NOT DETECTED Final   Serratia marcescens NOT DETECTED NOT DETECTED Final   Haemophilus influenzae NOT DETECTED NOT DETECTED Final   Neisseria meningitidis NOT DETECTED NOT DETECTED Final   Pseudomonas aeruginosa NOT DETECTED NOT DETECTED Final   Candida albicans NOT DETECTED NOT DETECTED Final   Candida glabrata NOT DETECTED NOT DETECTED Final   Candida krusei NOT DETECTED NOT DETECTED Final   Candida parapsilosis NOT DETECTED NOT DETECTED Final   Candida tropicalis NOT DETECTED NOT DETECTED  Final    Today   Subjective    Curtis Keller today has no headache,no chest abdominal pain,no new weakness tingling or numbness, feels much better      Objective   Blood pressure 111/72, pulse 81, temperature 98.2 F (36.8 C), temperature source Oral, resp. rate 20, height 6\' 2"  (1.88 m), weight 116.1 kg (256 lb), SpO2 93 %.   Intake/Output Summary (Last 24 hours) at 12/13/16 1326 Last data filed at 12/13/16 0600  Gross per 24 hour  Intake           446.08 ml  Output             1900 ml  Net         -1453.92 ml    Exam Awake,Appears lethargic, No new F.N deficits, chronic left-sided weakness from previous stroke, Normal affect [place].AT,PERRAL Supple Neck,No JVD, No cervical lymphadenopathy appriciated.  Symmetrical Chest wall movement, Good air movement bilaterally, coarse bilateral breath sounds RRR,No Gallops,Rubs or new Murmurs, No Parasternal Heave +ve B.Sounds, Abd Soft, Non tender, No organomegaly appriciated, No rebound -guarding or rigidity. No Cyanosis, Clubbing or edema, No new Rash or bruise   Data Review   CBC w Diff:  Lab Results  Component Value Date   WBC 11.4 (H) 12/13/2016   HGB 13.3 12/13/2016   HCT 41.0 12/13/2016   PLT 180 12/13/2016   LYMPHOPCT 6 11/29/2016   BANDSPCT 0 01/24/2016   MONOPCT 5 11/29/2016   EOSPCT 0 11/29/2016   BASOPCT 0 11/29/2016    CMP:  Lab Results  Component Value Date   NA 146 (H) 12/13/2016   NA 137 03/15/2016   K 4.3 12/13/2016   CL 109 12/13/2016   CO2 28 12/13/2016   BUN 60 (H) 12/13/2016   BUN 18 03/15/2016   CREATININE 2.30 (H) 12/13/2016   GLU 155 03/15/2016   PROT 6.7 12/11/2016   ALBUMIN 1.7 (L) 12/11/2016   BILITOT 0.6 12/11/2016   ALKPHOS 84 12/11/2016   AST 26 12/11/2016   ALT 19 12/11/2016  .   Total Time in preparing paper work, data evaluation and todays exam - 35 minutes  Leroy Sea M.D on 12/13/2016  at 1:26 PM  [facility]   Office  [phone]

## 2016-12-13 NOTE — Progress Notes (Signed)
ANTICOAGULATION CONSULT NOTE - Initial Consult  Pharmacy Consult for Heparin>>>Lovenox Indication: LV thrombus   Pharmacy Consult for Vanco/Merrem>>>Augmentin  Indication: PNA  Allergies  Allergen Reactions  . Codeine Anaphylaxis and Nausea And Vomiting  . Hydrocodone Other (See Comments)    On MAR  . Hydrocodone-Acetaminophen Nausea And Vomiting  . Tegaderm Ag Mesh [Silver] Other (See Comments)    On MAR  . Lisinopril Itching and Rash  . Tylenol [Acetaminophen] Nausea Only   Patient Measurements: Height: 6\' 2"  (188 cm) Weight: 250 lb 1.6 oz (113.4 kg) IBW/kg (Calculated) : 82.2  Vital Signs: Temp: 98 F (36.7 C) (02/20 0400) Temp Source: Oral (02/20 0400) BP: 108/64 (02/20 0400) Pulse Rate: 80 (02/20 0400)  Labs:  Recent Labs  12/11/16 0257  12/12/16 0135  12/12/16 1239 12/12/16 2237 12/13/16 0327  HGB 14.3  --  12.8*  --   --   --  13.3  HCT 43.9  --  38.7*  --   --   --  41.0  PLT 167  --  161  --   --   --  180  LABPROT  --   --   --   --   --   --  15.0  INR  --   --   --   --   --   --  1.18  HEPARINUNFRC  --   < > 0.22*  < > 0.10* 0.33 0.35  CREATININE 2.77*  --   --   --   --   --  2.30*  < > = values in this interval not displayed.  Estimated Creatinine Clearance: 45.2 mL/min (by C-G formula based on SCr of 2.3 mg/dL (H)).   Medical History: Past Medical History:  Diagnosis Date  . Acute respiratory failure (HCC)    a. Spring 2017 following fall/splenectomy -->admitted to [facility]-->trach/g tube.  . Anemia    a. 12/2015 ABL in setting of fall/hematomas/splenic laceration req splenectomy.  Marland Kitchen Apical mural thrombus    a. 11/2015 Echo: EF 25-30%, moderate size apical thrombus-->coumadin;  b. 12/2015 f/u Echo: EF 25-30%, ant/septa HK, no obvious large thrombus but cannot exclude small mural thrombus.  Marland Kitchen CAD (coronary artery disease)    a. 12/2014 s/p BMS to the RCA.  Marland Kitchen Cerebral infarction (HCC)   . Chronic systolic CHF (congestive heart  failure) (HCC)    a. 12/2015 Echo: EF 25-30%.  . Dementia   . Depression   . Dysphagia   . Falls    a. 11/2015 traumatic fall with resultant trauma req splenectomy and prolonged hospitalization complicated by resp failure.  Marland Kitchen History of pneumonia   . Hyperlipidemia   . Hypertension   . Ischemic cardiomyopathy    a. 12/2015 Echo: EF 25-30%, anterior and septal HK.  Marland Kitchen Left hemiplegia (HCC)   . Protein calorie malnutrition (HCC)   . Respiratory failure (HCC)   . Spleen injury   . Status post tracheostomy (HCC)    a. 01/2016 in setting of ongoing resp failure and aspiration.    Assessment: Switching from heparin to Lovenox, warfarin re-started 2/19 as well. CBC good this AM.   Also requested to transition from Vancomycin/Merrem to Augmentin.   Goal of Therapy:  Monitor platelets by anticoagulation protocol: Yes   Plan:  -DC heparin -Start Lovenox 1 mg/kg subcutaneous q12h -Minimum q72h CBC while inpatient  -Monitor for bleeding -DC Vancomycin/Merrem -Start Augmentin 875/125mg  q12h -Trend WBC, temp, renal function   Abran Duke 12/13/2016,6:47  AM

## 2016-12-13 NOTE — Discharge Instructions (Signed)
Follow with Primary MD Kirt Boys, DO in 7 days   Get CBC, CMP, INR, 2 view Chest X ray checked  By SNF MD in 2 days ( we routinely change or add medications that can affect your baseline labs and fluid status, therefore we recommend that you get the mentioned basic workup next visit with your PCP, your PCP may decide not to get them or add new tests based on their clinical decision)  Activity: Non weight bearing L Leg.  As tolerated with Full fall precautions use walker/cane & assistance as needed  Disposition LTAC   Diet:   DIET DYS 3 Fluid consistency: Nectar Thick with feeding assistance and aspiration precautions.  For Heart failure patients - Check your Weight same time everyday, if you gain over 2 pounds, or you develop in leg swelling, experience more shortness of breath or chest pain, call your Primary MD immediately. Follow Cardiac Low Salt Diet and 1.5 lit/day fluid restriction.  On your next visit with your primary care physician please Get Medicines reviewed and adjusted.  Please request your Prim.MD to go over all Hospital Tests and Procedure/Radiological results at the follow up, please get all Hospital records sent to your Prim MD by signing hospital release before you go home.  If you experience worsening of your admission symptoms, develop shortness of breath, life threatening emergency, suicidal or homicidal thoughts you must seek medical attention immediately by calling 911 or calling your MD immediately  if symptoms less severe.  You Must read complete instructions/literature along with all the possible adverse reactions/side effects for all the Medicines you take and that have been prescribed to you. Take any new Medicines after you have completely understood and accpet all the possible adverse reactions/side effects.   Do not drive, operate heavy machinery, perform activities at heights, swimming or participation in water activities or provide baby sitting services if  your were admitted for syncope or siezures until you have seen by Primary MD or a Neurologist and advised to do so again.  Do not drive when taking Pain medications.    Do not take more than prescribed Pain, Sleep and Anxiety Medications  Special Instructions: If you have smoked or chewed Tobacco  in the last 2 yrs please stop smoking, stop any regular Alcohol  and or any Recreational drug use.  Wear Seat belts while driving.   Please note  You were cared for by a hospitalist during your hospital stay. If you have any questions about your discharge medications or the care you received while you were in the hospital after you are discharged, you can call the unit and asked to speak with the hospitalist on call if the hospitalist that took care of you is not available. Once you are discharged, your primary care physician will handle any further medical issues. Please note that NO REFILLS for any discharge medications will be authorized once you are discharged, as it is imperative that you return to your primary care physician (or establish a relationship with a primary care physician if you do not have one) for your aftercare needs so that they can reassess your need for medications and monitor your lab values.

## 2016-12-13 NOTE — Progress Notes (Signed)
ANTICOAGULATION CONSULT NOTE - f/u Consult  Pharmacy Consult for lovenox, coumadin  Indication: mural thrombus with history of stroke  Allergies  Allergen Reactions  . Codeine Anaphylaxis and Nausea And Vomiting  . Hydrocodone Other (See Comments)    On MAR  . Hydrocodone-Acetaminophen Nausea And Vomiting  . Tegaderm Ag Mesh [Silver] Other (See Comments)    On MAR  . Lisinopril Itching and Rash  . Tylenol [Acetaminophen] Nausea Only    Patient Measurements: Height: 6\' 2"  (188 cm) Weight: 256 lb (116.1 kg) IBW/kg (Calculated) : 82.2 Heparin Dosing Weight: 95kg  Vital Signs: Temp: 97.8 F (36.6 C) (02/20 0801) Temp Source: Oral (02/20 0801) BP: 109/63 (02/20 0801) Pulse Rate: 65 (02/20 0801)  Labs:  Recent Labs  12/11/16 0257  12/12/16 0135  12/12/16 1239 12/12/16 2237 12/13/16 0327  HGB 14.3  --  12.8*  --   --   --  13.3  HCT 43.9  --  38.7*  --   --   --  41.0  PLT 167  --  161  --   --   --  180  LABPROT  --   --   --   --   --   --  15.0  INR  --   --   --   --   --   --  1.18  HEPARINUNFRC  --   < > 0.22*  < > 0.10* 0.33 0.35  CREATININE 2.77*  --   --   --   --   --  2.30*  < > = values in this interval not displayed.  Estimated Creatinine Clearance: 45.7 mL/min (by C-G formula based on SCr of 2.3 mg/dL (H)).   Medical History: Past Medical History:  Diagnosis Date  . Acute respiratory failure (HCC)    a. Spring 2017 following fall/splenectomy -->admitted to [facility]-->trach/g tube.  . Anemia    a. 12/2015 ABL in setting of fall/hematomas/splenic laceration req splenectomy.  Marland Kitchen Apical mural thrombus    a. 11/2015 Echo: EF 25-30%, moderate size apical thrombus-->coumadin;  b. 12/2015 f/u Echo: EF 25-30%, ant/septa HK, no obvious large thrombus but cannot exclude small mural thrombus.  Marland Kitchen CAD (coronary artery disease)    a. 12/2014 s/p BMS to the RCA.  Marland Kitchen Cerebral infarction (HCC)   . Chronic systolic CHF (congestive heart failure) (HCC)    a.  12/2015 Echo: EF 25-30%.  . Dementia   . Depression   . Dysphagia   . Falls    a. 11/2015 traumatic fall with resultant trauma req splenectomy and prolonged hospitalization complicated by resp failure.  Marland Kitchen History of pneumonia   . Hyperlipidemia   . Hypertension   . Ischemic cardiomyopathy    a. 12/2015 Echo: EF 25-30%, anterior and septal HK.  Marland Kitchen Left hemiplegia (HCC)   . Protein calorie malnutrition (HCC)   . Respiratory failure (HCC)   . Spleen injury   . Status post tracheostomy (HCC)    a. 01/2016 in setting of ongoing resp failure and aspiration.   Assessment: 61 y.o. male with history chronic systolic heart failure (EF 25-30%), history of LV thrombus (on coumadin PTA), prior CVA with chronic left-sided hemiplegia-multiple falls over the past few years causing splenic rupture-presented to the hospital on 2/3 following a mechanical fall, found to have a left hip fracture. Family prefers to continue aggressive care including anticoagulation so IV heparin was restarted on 2/18 then changed to lovenox on 2/20. Warfarin restarted 2/19 -INR=  1.18, SCr= 2.3, CrCl ~ 45  Coumadin dose PTA: 3.5mg /d  Goal of Therapy:  Heparin level 0.3-0.7 units/ml Monitor platelets by anticoagulation protocol: Yes   Plan:  -Continue lovenox 110mg  sq q12h -Coumadin 5mg  po today -Daily PT/INR  Harland German, Pharm D 12/13/2016 9:31 AM

## 2016-12-13 NOTE — Progress Notes (Addendum)
1:40pm: Patient admitting to [facility] today. CSW signing off.  CSW spoke with patient's daughter regarding patient's discharge plan back to [facility] for today. Patient's daughter reported being upset and stated that patient was not ready to be discharged. CSW alerted RNCM of daughter's concerns.  Osborne Casco [place] LCSWA [phone]

## 2016-12-13 NOTE — Progress Notes (Signed)
Pt was switched from high flow oxygen to [place] 5L and o2 sat dropped to 78%.  Reapplied high flow oxygen to 5 and oxygen came back to 86%.  Will continue to monitor.

## 2016-12-13 NOTE — Care Management Note (Signed)
Case Management Note  Patient Details  Name: Curtis Keller MRN: [id number] Date of Birth: 04/21/1956  Subjective/Objective:         Adm w resp distress, fx hip           Action/Plan:was at [facility] pta   Expected Discharge Date:  12/13/16               Expected Discharge Plan:  Long Term Acute Care (LTAC)  In-House Referral:  Clinical Social Work, Hospice / Palliative Care  Discharge planning Services  CM Consult  Post Acute Care Choice:    Choice offered to:     DME Arranged:    DME Agency:     HH Arranged:    HH Agency:     Status of Service:  Completed, signed off  If discussed at [employer], dates discussed:    Additional Comments:pt unable to maintain oxygen on 5lit which is highest liter flow snf can handle. Both ltacs offered bed but [facility] req [facility] for [facility] since they can maintain [facility]fnc oxygen. Pt to dc [facility] today. Hanley Hays, RN 12/13/2016, 2:03 PM

## 2016-12-14 ENCOUNTER — Other Ambulatory Visit (HOSPITAL_COMMUNITY): Payer: Self-pay

## 2016-12-14 DIAGNOSIS — J9690 Respiratory failure, unspecified, unspecified whether with hypoxia or hypercapnia: Secondary | ICD-10-CM | POA: Diagnosis not present

## 2016-12-14 LAB — COMPREHENSIVE METABOLIC PANEL
ALBUMIN: 1.4 g/dL — AB (ref 3.5–5.0)
ALK PHOS: 149 U/L — AB (ref 38–126)
ALT: 42 U/L (ref 17–63)
ANION GAP: 11 (ref 5–15)
AST: 47 U/L — AB (ref 15–41)
BUN: 50 mg/dL — AB (ref 6–20)
CALCIUM: 8.4 mg/dL — AB (ref 8.9–10.3)
CO2: 27 mmol/L (ref 22–32)
Chloride: 107 mmol/L (ref 101–111)
Creatinine, Ser: 1.99 mg/dL — ABNORMAL HIGH (ref 0.61–1.24)
GFR calc Af Amer: 40 mL/min — ABNORMAL LOW (ref >60)
GFR calc non Af Amer: 34 mL/min — ABNORMAL LOW (ref >60)
GLUCOSE: 182 mg/dL — AB (ref 65–99)
Potassium: 4.4 mmol/L (ref 3.5–5.1)
SODIUM: 145 mmol/L (ref 135–145)
Total Bilirubin: 0.6 mg/dL (ref 0.3–1.2)
Total Protein: 5.9 g/dL — ABNORMAL LOW (ref 6.5–8.1)

## 2016-12-14 LAB — CBC
HCT: 38.8 % — ABNORMAL LOW (ref 39.0–52.0)
Hemoglobin: 12.5 g/dL — ABNORMAL LOW (ref 13.0–17.0)
MCH: 32.7 pg (ref 26.0–34.0)
MCHC: 32.2 g/dL (ref 30.0–36.0)
MCV: 101.6 fL — ABNORMAL HIGH (ref 78.0–100.0)
Platelets: 217 10*3/uL (ref 150–400)
RBC: 3.82 MIL/uL — ABNORMAL LOW (ref 4.22–5.81)
RDW: 15.7 % — ABNORMAL HIGH (ref 11.5–15.5)
WBC: 12 10*3/uL — ABNORMAL HIGH (ref 4.0–10.5)

## 2016-12-14 LAB — PROTIME-INR
INR: 1.34
PROTHROMBIN TIME: 16.6 s — AB (ref 11.4–15.2)

## 2016-12-15 LAB — PROTIME-INR
INR: 1.44
Prothrombin Time: 17.7 seconds — ABNORMAL HIGH (ref 11.4–15.2)

## 2016-12-15 LAB — CULTURE, BLOOD (ROUTINE X 2): Culture: NO GROWTH

## 2016-12-16 ENCOUNTER — Other Ambulatory Visit (HOSPITAL_COMMUNITY): Payer: Self-pay

## 2016-12-16 DIAGNOSIS — S72002A Fracture of unspecified part of neck of left femur, initial encounter for closed fracture: Secondary | ICD-10-CM | POA: Diagnosis not present

## 2016-12-16 DIAGNOSIS — S0990XA Unspecified injury of head, initial encounter: Secondary | ICD-10-CM | POA: Diagnosis not present

## 2016-12-16 LAB — BASIC METABOLIC PANEL
ANION GAP: 8 (ref 5–15)
BUN: 41 mg/dL — ABNORMAL HIGH (ref 6–20)
CHLORIDE: 110 mmol/L (ref 101–111)
CO2: 25 mmol/L (ref 22–32)
Calcium: 8.9 mg/dL (ref 8.9–10.3)
Creatinine, Ser: 1.6 mg/dL — ABNORMAL HIGH (ref 0.61–1.24)
GFR calc non Af Amer: 45 mL/min — ABNORMAL LOW (ref >60)
GFR, EST AFRICAN AMERICAN: 52 mL/min — AB (ref >60)
GLUCOSE: 142 mg/dL — AB (ref 65–99)
POTASSIUM: 4.1 mmol/L (ref 3.5–5.1)
Sodium: 143 mmol/L (ref 135–145)

## 2016-12-16 LAB — PROTIME-INR
INR: 1.63
PROTHROMBIN TIME: 19.5 s — AB (ref 11.4–15.2)

## 2016-12-17 DIAGNOSIS — S72002A Fracture of unspecified part of neck of left femur, initial encounter for closed fracture: Secondary | ICD-10-CM | POA: Diagnosis not present

## 2016-12-17 DIAGNOSIS — S0990XA Unspecified injury of head, initial encounter: Secondary | ICD-10-CM | POA: Diagnosis not present

## 2016-12-17 LAB — PROTIME-INR
INR: 1.87
Prothrombin Time: 21.8 seconds — ABNORMAL HIGH (ref 11.4–15.2)

## 2016-12-18 LAB — PROTIME-INR
INR: 3.32
Prothrombin Time: 34.5 seconds — ABNORMAL HIGH (ref 11.4–15.2)

## 2016-12-19 LAB — PROTIME-INR
INR: 2.49
Prothrombin Time: 27.4 seconds — ABNORMAL HIGH (ref 11.4–15.2)

## 2016-12-20 LAB — BASIC METABOLIC PANEL
Anion gap: 7 (ref 5–15)
BUN: 36 mg/dL — ABNORMAL HIGH (ref 6–20)
CALCIUM: 8.6 mg/dL — AB (ref 8.9–10.3)
CO2: 27 mmol/L (ref 22–32)
CREATININE: 1.75 mg/dL — AB (ref 0.61–1.24)
Chloride: 102 mmol/L (ref 101–111)
GFR calc non Af Amer: 40 mL/min — ABNORMAL LOW (ref >60)
GFR, EST AFRICAN AMERICAN: 47 mL/min — AB (ref >60)
Glucose, Bld: 175 mg/dL — ABNORMAL HIGH (ref 65–99)
Potassium: 4.2 mmol/L (ref 3.5–5.1)
SODIUM: 136 mmol/L (ref 135–145)

## 2016-12-20 LAB — PROTIME-INR
INR: 2.11
PROTHROMBIN TIME: 24 s — AB (ref 11.4–15.2)

## 2016-12-21 ENCOUNTER — Other Ambulatory Visit (HOSPITAL_COMMUNITY): Payer: Self-pay

## 2016-12-21 DIAGNOSIS — J9690 Respiratory failure, unspecified, unspecified whether with hypoxia or hypercapnia: Secondary | ICD-10-CM | POA: Diagnosis not present

## 2016-12-21 DIAGNOSIS — Z452 Encounter for adjustment and management of vascular access device: Secondary | ICD-10-CM | POA: Diagnosis not present

## 2016-12-21 LAB — BLOOD GAS, ARTERIAL
ACID-BASE EXCESS: 3.9 mmol/L — AB (ref 0.0–2.0)
BICARBONATE: 28 mmol/L (ref 20.0–28.0)
FIO2: 80
O2 Content: 40 L/min
O2 Saturation: 95.2 %
PATIENT TEMPERATURE: 98.6
PH ART: 7.432 (ref 7.350–7.450)
pCO2 arterial: 42.7 mmHg (ref 32.0–48.0)
pO2, Arterial: 74.8 mmHg — ABNORMAL LOW (ref 83.0–108.0)

## 2016-12-21 LAB — URINALYSIS, ROUTINE W REFLEX MICROSCOPIC
Bilirubin Urine: NEGATIVE
Glucose, UA: NEGATIVE mg/dL
Ketones, ur: NEGATIVE mg/dL
Leukocytes, UA: NEGATIVE
Nitrite: NEGATIVE
Protein, ur: 100 mg/dL — AB
SPECIFIC GRAVITY, URINE: 1.021 (ref 1.005–1.030)
SQUAMOUS EPITHELIAL / LPF: NONE SEEN
pH: 5 (ref 5.0–8.0)

## 2016-12-21 LAB — PROTIME-INR
INR: 2.16
Prothrombin Time: 24.4 seconds — ABNORMAL HIGH (ref 11.4–15.2)

## 2016-12-22 LAB — PROTIME-INR
INR: 2.38
PROTHROMBIN TIME: 26.4 s — AB (ref 11.4–15.2)

## 2016-12-23 ENCOUNTER — Other Ambulatory Visit (HOSPITAL_COMMUNITY): Payer: Self-pay

## 2016-12-23 DIAGNOSIS — J9811 Atelectasis: Secondary | ICD-10-CM | POA: Diagnosis not present

## 2016-12-23 DIAGNOSIS — J181 Lobar pneumonia, unspecified organism: Secondary | ICD-10-CM | POA: Diagnosis not present

## 2016-12-23 DIAGNOSIS — Z4682 Encounter for fitting and adjustment of non-vascular catheter: Secondary | ICD-10-CM | POA: Diagnosis not present

## 2016-12-23 LAB — PROTIME-INR
INR: 2.61
PROTHROMBIN TIME: 28.5 s — AB (ref 11.4–15.2)

## 2016-12-23 LAB — CULTURE, RESPIRATORY

## 2016-12-23 LAB — URINE CULTURE: Culture: >=100000 — AB

## 2016-12-23 LAB — CULTURE, RESPIRATORY W GRAM STAIN

## 2016-12-23 LAB — MYCOPLASMA PNEUMONIAE ANTIBODY, IGM: Mycoplasma pneumo IgM: <770 U/mL (ref 0–769)

## 2016-12-24 ENCOUNTER — Other Ambulatory Visit (HOSPITAL_COMMUNITY): Payer: Self-pay

## 2016-12-24 DIAGNOSIS — Z4682 Encounter for fitting and adjustment of non-vascular catheter: Secondary | ICD-10-CM | POA: Diagnosis not present

## 2016-12-24 LAB — LEGIONELLA PNEUMOPHILA SEROGP 1 UR AG: L. PNEUMOPHILA SEROGP 1 UR AG: NEGATIVE

## 2016-12-25 ENCOUNTER — Other Ambulatory Visit (HOSPITAL_COMMUNITY): Payer: Self-pay

## 2016-12-25 DIAGNOSIS — J189 Pneumonia, unspecified organism: Secondary | ICD-10-CM | POA: Diagnosis not present

## 2016-12-25 DIAGNOSIS — J811 Chronic pulmonary edema: Secondary | ICD-10-CM | POA: Diagnosis not present

## 2016-12-25 LAB — BLOOD GAS, ARTERIAL
Acid-Base Excess: 9.6 mmol/L — ABNORMAL HIGH (ref 0.0–2.0)
Bicarbonate: 33.8 mmol/L — ABNORMAL HIGH (ref 20.0–28.0)
FIO2: 100
O2 Content: 40 L/min
O2 SAT: 92.5 %
PATIENT TEMPERATURE: 98.6
PH ART: 7.471 — AB (ref 7.350–7.450)
pCO2 arterial: 46.9 mmHg (ref 32.0–48.0)
pO2, Arterial: 64.3 mmHg — ABNORMAL LOW (ref 83.0–108.0)

## 2016-12-25 LAB — PROTIME-INR
INR: 3.83
PROTHROMBIN TIME: 38.6 s — AB (ref 11.4–15.2)

## 2016-12-25 LAB — VANCOMYCIN, TROUGH: Vancomycin Tr: 15 ug/mL (ref 15–20)

## 2016-12-25 NOTE — Progress Notes (Signed)
Location:   [facility]   Place of Service:  SNF (31)   CODE STATUS: full code   Allergies  Allergen Reactions  . Codeine Anaphylaxis and Nausea And Vomiting  . Hydrocodone Other (See Comments)    On MAR  . Hydrocodone-Acetaminophen Nausea And Vomiting  . Tegaderm Ag Mesh [Silver] Other (See Comments)    On MAR  . Lisinopril Itching and Rash  . Tylenol [Acetaminophen] Nausea Only    Chief Complaint  Patient presents with  . Medical Management of Chronic Issues    HPI:  He is a long term resident of this facility being seen for the management of his chronic illnesses. Overall his status is stable. He does get out of bed daily. He is unable to fully participate in the hpi or ros; but did tell me that he feels good today. There are no nursing concerns at this time.    Past Medical History:  Diagnosis Date  . Acute respiratory failure (HCC)    a. Spring 2017 following fall/splenectomy -->admitted to [facility]-->trach/g tube.  . Anemia    a. 12/2015 ABL in setting of fall/hematomas/splenic laceration req splenectomy.  Marland Kitchen Apical mural thrombus    a. 11/2015 Echo: EF 25-30%, moderate size apical thrombus-->coumadin;  b. 12/2015 f/u Echo: EF 25-30%, ant/septa HK, no obvious large thrombus but cannot exclude small mural thrombus.  Marland Kitchen CAD (coronary artery disease)    a. 12/2014 s/p BMS to the RCA.  Marland Kitchen Cerebral infarction (HCC)   . Chronic systolic CHF (congestive heart failure) (HCC)    a. 12/2015 Echo: EF 25-30%.  . Dementia   . Depression   . Dysphagia   . Falls    a. 11/2015 traumatic fall with resultant trauma req splenectomy and prolonged hospitalization complicated by resp failure.  Marland Kitchen History of pneumonia   . Hyperlipidemia   . Hypertension   . Ischemic cardiomyopathy    a. 12/2015 Echo: EF 25-30%, anterior and septal HK.  Marland Kitchen Left hemiplegia (HCC)   . Protein calorie malnutrition (HCC)   . Respiratory failure (HCC)   . Spleen injury   . Status post tracheostomy  (HCC)    a. 01/2016 in setting of ongoing resp failure and aspiration.    Past Surgical History:  Procedure Laterality Date  . CARDIAC CATHETERIZATION    . CORONARY ANGIOPLASTY WITH STENT PLACEMENT    . IR GENERIC HISTORICAL  07/22/2016   IR GASTROSTOMY TUBE REMOVAL 07/22/2016 [name], MD [facility]-INTERV RAD  . PR LAP, SPLENECTOMY  01/07/2016  . TRACHEOSTOMY TUBE PLACEMENT N/A 02/22/2016   Procedure: TRACHEOSTOMY;  Surgeon: [name], MD;  Location: [facility] OR;  Service: ENT;  Laterality: N/A;    Social History   Social History  . Marital status: Single    Spouse name: N/A  . Number of children: N/A  . Years of education: N/A   Occupational History  . Not on file.   Social History Main Topics  . Smoking status: Former Smoker    Packs/day: 2.00    Years: 38.00    Types: Cigarettes  . Smokeless tobacco: Never Used     Comment: quit spring of 2017.  Marland Kitchen Alcohol use No  . Drug use: No  . Sexual activity: Not on file   Other Topics Concern  . Not on file   Social History Narrative   Lives @ SNF since Spring 2017.  Sedentary.  Unsteady on his feet.  Falls about 1x/wk.   Family History  Problem Relation Age of Onset  . Alcohol abuse Mother   . Hypertension Mother   . Coronary artery disease Mother   . Alcohol abuse Father   . Hypertension Father   . Coronary artery disease Father   . Alcohol abuse Maternal Uncle       VITAL SIGNS BP 98/70   Pulse 71   Temp 98.8 F (37.1 C)   Resp 18   Ht 6\' 3"  (1.905 m)   Wt 220 lb (99.8 kg)   SpO2 95%   BMI 27.50 kg/m     Patient's Medications  New Prescriptions   No medications on file  Previous Medications   AMINO ACIDS-PROTEIN HYDROLYS (FEEDING SUPPLEMENT, PRO-STAT SUGAR FREE 64,) LIQD    Take 60 mLs by mouth 2 (two) times daily. Pro Stat Awc 17-100 gram-kcal    ATORVASTATIN (LIPITOR) 40 MG TABLET    Take 40 mg by mouth at bedtime.   BUSPIRONE (BUSPAR) 10 MG TABLET    Take 10 mg by mouth 2 (two) times daily.    FAMOTIDINE (PEPCID) 20 MG TABLET    Take 20 mg by mouth 2 (two) times daily.   FLUOXETINE HCL 60 MG TABS    Take 1 tablet by mouth daily.   IBUPROFEN (ADVIL,MOTRIN) 200 MG TABLET    Take 400 mg by mouth every 4 (four) hours as needed.    INSULIN LISPRO (HUMALOG KWIKPEN) 100 UNIT/ML KIWKPEN    Inject 5 Units into the skin every evening.   IPRATROPIUM-ALBUTEROL (DUONEB) 0.5-2.5 (3) MG/3ML SOLN    Take 3 mLs by nebulization every 6 (six) hours as needed (via trach).    LORAZEPAM (ATIVAN) 0.5 MG TABLET    Take 0.5 mg by mouth every 8 (eight) hours as needed for anxiety.   MENTHOL, TOPICAL ANALGESIC, (BIOFREEZE EX)    Apply topically as needed.   METOPROLOL SUCCINATE (TOPROL-XL) 25 MG 24 HR TABLET    Take 25 mg by mouth 2 (two) times daily.   OMEGA-3 FATTY ACIDS (FISH OIL PO)    Take by mouth. Reported on 03/10/2016   OXYCODONE HCL, ABUSE DETER, (OXAYDO) 5 MG TABA    Take by mouth. Give 10 mg by mouth every 6 hours prn severe pain.    OXYCODONE HCL, ABUSE DETER, (OXAYDO) 5 MG TABA    1 tablet by mouth every 6 hours as needed    OXYGEN    Inhale 3 L into the lungs as needed. For SOB   PREGABALIN (LYRICA) 75 MG CAPSULE    Take 75 mg by mouth 2 (two) times daily.   SENNOSIDES (SENNA LAX PO)    2 tablets at bedtime. Take by mouth nightly    TORSEMIDE (DEMADEX) 10 MG TABLET    Take 10 mg by mouth daily.   VITAMIN C (ASCORBIC ACID) 500 MG TABLET    Take 500 mg by mouth 2 (two) times daily. 10,22   WARFARIN (COUMADIN) 5 MG TABLET    Take 5 mg by mouth daily. Every evening   ZINC SULFATE 220 (50 ZN) MG CAPSULE    Take 220 mg by mouth daily. At 1800  Modified Medications   No medications on file  Discontinued Medications   No medications on file     SIGNIFICANT DIAGNOSTIC EXAMS  02-24-16: chest x-ray: 1. Tracheostomy tube tip in satisfactory position approximately 5 cm above the carina. 2. Significant reduction in pneumomediastinum and subcutaneous emphysema since the examination 2 days ago. 3. Improved  interstitial pulmonary edema,  though mild interstitial edema persists. 4. New bibasilar atelectasis and new dense atelectasis and/or pneumonia in the right lower lobe.  04-04-16: right hip and pelvic x-ray: modest osteoarthritis no pelvic or hip fracture   04-20-16: left knee x-ray; mild osteoarthritis   07-06-16: 2-d echo: 1. technically difficult; 2. Normal left ventricular systolic function EF 55-60%; 3. Mild relaxation abnormality 4. Mild concentric left ventricular hypertrophy 5. Mild sclerotic aortic valve no significant stenosis 6. Trace tricuspid regurgitation . 7. Cannot rule out thrombus secondary to poor quality   07-11-16: chest x-ray; no acute cardiopulmonary process   LABS REVIEWED:   02-06-16: hgb a1c 5.2 03-08-16: wbc 8.9; gb 12.8; hct 39.0; mcv 98.5 plt 329; glucose 196; bun 32; creat 0.99; k+ 3.6; na++ 135  03-15-16: wbc 9.7; hb 11.4; hct 35.9; mcv 88.2; plt 303; glucose 155; bun 18.1; creat 0.95; k+ 4.3; na++ 137 03-18-16: inr 2.3 03-22-16: inr 2.7: coumadin 5 mg daily  05-18-16: INR 2.7  05-24-16: chol 105; ldl 40; trig 86; hdl 47 urine micro-albumin 9.7 06-21-16: INR 2.3   07-19-16: wbc 8.5; hgb 12.5; hct 40.4; mcv 98.8; plt 233; glucose 70; bun 29.5; creat 1.27; k+ 4.2; na++ 141; INR 2.9  08-21-16: INR 4.2  10-05-16: tsh 2.62; vit B 12: 264; folate 17.8     Review of Systems  Unable to perform ROS: other    Physical Exam  Constitutional: No distress.  Eyes: Conjunctivae are normal.  Neck: Neck supple. No JVD present. No thyromegaly present.  Cardiovascular: Normal rate, regular rhythm and intact distal pulses.   Respiratory: Effort normal and breath sounds normal. No respiratory distress. He has no wheezes.  GI: Soft. Bowel sounds are normal. He exhibits no distension. There is no tenderness.  Musculoskeletal: He exhibits no edema.  Able to move all extremities  Has left side weakness   Lymphadenopathy:    He has no cervical adenopathy.  Neurological: He is alert.    Skin: Skin is warm and dry. He is not diaphoretic.  Psychiatric: He has a normal mood and affect.       ASSESSMENT/ PLAN:  1. Apical mural thrombus: is presently stable; is on chronic coumadin therapy   2. Diastolic heart failure: EF 55-60% (90-13-17) will continue demadex 10 mg daily   3. Hypertension: will continue toprol xl 25 mg twice daily   4. Dyslipidemia: will continue lipitor 40 mg daily fish oil 1 mg daily  ldl 40   5. Gerd: will continue pepcid 20 mg twice daily   6. Constipation: will continue senna 2 tabs nightly   7. COPD: will continue douneb every 6 hours as needed  8. Dysphagia: no signs of aspiration present; will not make changes; is awaiting his peg tube removal  9. Depression with anxiety: will continue prozac 60 mg daily buspar 10 mg twice daily has ativan 0.5 mg every 8 hours as needed  10.  Chronic pain; will continue lyrica 75 mg twice daily and has oxycodone 5 mg every  4 hours as needed       MD is aware of resident's narcotic use and is in agreement with current plan of care. We will attempt to wean resident as apropriate   Synthia Innocent NP [facility]  Contact [phone] Monday through Friday 8am- 5pm  After hours call [phone]

## 2016-12-26 ENCOUNTER — Other Ambulatory Visit (HOSPITAL_COMMUNITY): Payer: Self-pay

## 2016-12-26 DIAGNOSIS — R0602 Shortness of breath: Secondary | ICD-10-CM | POA: Diagnosis not present

## 2016-12-26 LAB — BASIC METABOLIC PANEL
Anion gap: 4 — ABNORMAL LOW (ref 5–15)
BUN: 48 mg/dL — AB (ref 6–20)
CHLORIDE: 102 mmol/L (ref 101–111)
CO2: 33 mmol/L — ABNORMAL HIGH (ref 22–32)
Calcium: 9.2 mg/dL (ref 8.9–10.3)
Creatinine, Ser: 1.65 mg/dL — ABNORMAL HIGH (ref 0.61–1.24)
GFR calc non Af Amer: 43 mL/min — ABNORMAL LOW (ref >60)
GFR, EST AFRICAN AMERICAN: 50 mL/min — AB (ref >60)
Glucose, Bld: 109 mg/dL — ABNORMAL HIGH (ref 65–99)
POTASSIUM: 4 mmol/L (ref 3.5–5.1)
SODIUM: 139 mmol/L (ref 135–145)

## 2016-12-26 LAB — CBC
HCT: 38.9 % — ABNORMAL LOW (ref 39.0–52.0)
HEMOGLOBIN: 12.7 g/dL — AB (ref 13.0–17.0)
MCH: 32.5 pg (ref 26.0–34.0)
MCHC: 32.6 g/dL (ref 30.0–36.0)
MCV: 99.5 fL (ref 78.0–100.0)
Platelets: 339 10*3/uL (ref 150–400)
RBC: 3.91 MIL/uL — AB (ref 4.22–5.81)
RDW: 14.6 % (ref 11.5–15.5)
WBC: 9.3 10*3/uL (ref 4.0–10.5)

## 2016-12-26 LAB — PROTIME-INR
INR: 3.24
PROTHROMBIN TIME: 33.8 s — AB (ref 11.4–15.2)

## 2016-12-27 ENCOUNTER — Other Ambulatory Visit (HOSPITAL_COMMUNITY): Payer: Self-pay

## 2016-12-27 DIAGNOSIS — J9690 Respiratory failure, unspecified, unspecified whether with hypoxia or hypercapnia: Secondary | ICD-10-CM | POA: Diagnosis not present

## 2016-12-27 DIAGNOSIS — Z4682 Encounter for fitting and adjustment of non-vascular catheter: Secondary | ICD-10-CM | POA: Diagnosis not present

## 2016-12-27 LAB — BLOOD GAS, ARTERIAL
ACID-BASE EXCESS: 11.2 mmol/L — AB (ref 0.0–2.0)
BICARBONATE: 35.9 mmol/L — AB (ref 20.0–28.0)
FIO2: 100
LHR: 16 {breaths}/min
O2 SAT: 99.4 %
PCO2 ART: 53.3 mmHg — AB (ref 32.0–48.0)
PEEP/CPAP: 5 cmH2O
PH ART: 7.443 (ref 7.350–7.450)
Patient temperature: 98.6
VT: 500 mL
pO2, Arterial: 195 mmHg — ABNORMAL HIGH (ref 83.0–108.0)

## 2016-12-27 LAB — PROTIME-INR
INR: 2.04
Prothrombin Time: 23.3 seconds — ABNORMAL HIGH (ref 11.4–15.2)

## 2016-12-28 LAB — BLOOD GAS, ARTERIAL
ACID-BASE EXCESS: 10.9 mmol/L — AB (ref 0.0–2.0)
Bicarbonate: 35.7 mmol/L — ABNORMAL HIGH (ref 20.0–28.0)
DRAWN BY: 290171
FIO2: 35
MECHVT: 500 mL
O2 SAT: 95.7 %
PEEP/CPAP: 5 cmH2O
PH ART: 7.429 (ref 7.350–7.450)
PO2 ART: 80.1 mmHg — AB (ref 83.0–108.0)
Patient temperature: 98.6
RATE: 14 resp/min
pCO2 arterial: 54.8 mmHg — ABNORMAL HIGH (ref 32.0–48.0)

## 2016-12-28 LAB — PROTIME-INR
INR: 1.58
PROTHROMBIN TIME: 19 s — AB (ref 11.4–15.2)

## 2016-12-29 LAB — PROTIME-INR
INR: 1.64
PROTHROMBIN TIME: 19.6 s — AB (ref 11.4–15.2)

## 2016-12-30 DIAGNOSIS — J690 Pneumonitis due to inhalation of food and vomit: Secondary | ICD-10-CM | POA: Diagnosis not present

## 2016-12-30 LAB — BASIC METABOLIC PANEL
Anion gap: 8 (ref 5–15)
BUN: 71 mg/dL — AB (ref 6–20)
CHLORIDE: 99 mmol/L — AB (ref 101–111)
CO2: 35 mmol/L — ABNORMAL HIGH (ref 22–32)
CREATININE: 2.21 mg/dL — AB (ref 0.61–1.24)
Calcium: 9.2 mg/dL (ref 8.9–10.3)
GFR calc Af Amer: 35 mL/min — ABNORMAL LOW (ref >60)
GFR calc non Af Amer: 30 mL/min — ABNORMAL LOW (ref >60)
Glucose, Bld: 144 mg/dL — ABNORMAL HIGH (ref 65–99)
Potassium: 3.7 mmol/L (ref 3.5–5.1)
SODIUM: 142 mmol/L (ref 135–145)

## 2016-12-30 LAB — PROTIME-INR
INR: 1.63
PROTHROMBIN TIME: 19.5 s — AB (ref 11.4–15.2)

## 2016-12-31 LAB — PROTIME-INR
INR: 1.86
Prothrombin Time: 21.7 seconds — ABNORMAL HIGH (ref 11.4–15.2)

## 2016-12-31 LAB — ACID FAST SMEAR (AFB, MYCOBACTERIA): Acid Fast Smear: NEGATIVE

## 2016-12-31 LAB — MAGNESIUM: MAGNESIUM: 2.4 mg/dL (ref 1.7–2.4)

## 2016-12-31 LAB — ACID FAST SMEAR (AFB)

## 2017-01-01 ENCOUNTER — Other Ambulatory Visit (HOSPITAL_COMMUNITY): Payer: Self-pay

## 2017-01-01 DIAGNOSIS — R918 Other nonspecific abnormal finding of lung field: Secondary | ICD-10-CM | POA: Diagnosis not present

## 2017-01-01 LAB — CULTURE, RESPIRATORY: CULTURE: NORMAL

## 2017-01-01 LAB — VANCOMYCIN, TROUGH: VANCOMYCIN TR: 28 ug/mL — AB (ref 15–20)

## 2017-01-01 LAB — CULTURE, RESPIRATORY W GRAM STAIN

## 2017-01-01 LAB — PROTIME-INR
INR: 2.25
Prothrombin Time: 25.2 seconds — ABNORMAL HIGH (ref 11.4–15.2)

## 2017-01-02 LAB — CBC WITH DIFFERENTIAL/PLATELET
BASOS ABS: 0.1 10*3/uL (ref 0.0–0.1)
Basophils Relative: 1 %
Eosinophils Absolute: 0.6 10*3/uL (ref 0.0–0.7)
Eosinophils Relative: 4 %
HEMATOCRIT: 39.1 % (ref 39.0–52.0)
HEMOGLOBIN: 12.5 g/dL — AB (ref 13.0–17.0)
LYMPHS ABS: 2.8 10*3/uL (ref 0.7–4.0)
LYMPHS PCT: 19 %
MCH: 32.3 pg (ref 26.0–34.0)
MCHC: 32 g/dL (ref 30.0–36.0)
MCV: 101 fL — ABNORMAL HIGH (ref 78.0–100.0)
MONOS PCT: 12 %
Monocytes Absolute: 1.7 10*3/uL — ABNORMAL HIGH (ref 0.1–1.0)
NEUTROS PCT: 64 %
Neutro Abs: 9.3 10*3/uL — ABNORMAL HIGH (ref 1.7–7.7)
Platelets: 305 10*3/uL (ref 150–400)
RBC: 3.87 MIL/uL — AB (ref 4.22–5.81)
RDW: 14.7 % (ref 11.5–15.5)
WBC: 14.5 10*3/uL — AB (ref 4.0–10.5)

## 2017-01-02 LAB — BASIC METABOLIC PANEL
ANION GAP: 8 (ref 5–15)
BUN: 86 mg/dL — ABNORMAL HIGH (ref 6–20)
CALCIUM: 9.6 mg/dL (ref 8.9–10.3)
CO2: 37 mmol/L — AB (ref 22–32)
Chloride: 101 mmol/L (ref 101–111)
Creatinine, Ser: 1.94 mg/dL — ABNORMAL HIGH (ref 0.61–1.24)
GFR calc non Af Amer: 36 mL/min — ABNORMAL LOW (ref >60)
GFR, EST AFRICAN AMERICAN: 41 mL/min — AB (ref >60)
Glucose, Bld: 154 mg/dL — ABNORMAL HIGH (ref 65–99)
POTASSIUM: 3.3 mmol/L — AB (ref 3.5–5.1)
Sodium: 146 mmol/L — ABNORMAL HIGH (ref 135–145)

## 2017-01-02 LAB — PROTIME-INR
INR: 1.78
Prothrombin Time: 20.9 seconds — ABNORMAL HIGH (ref 11.4–15.2)

## 2017-01-02 LAB — VANCOMYCIN, TROUGH: Vancomycin Tr: 27 ug/mL (ref 15–20)

## 2017-01-03 LAB — VANCOMYCIN, TROUGH: Vancomycin Tr: 16 ug/mL (ref 15–20)

## 2017-01-03 LAB — PROTIME-INR
INR: 2.35
Prothrombin Time: 26.1 seconds — ABNORMAL HIGH (ref 11.4–15.2)

## 2017-01-04 LAB — PROTIME-INR
INR: 3.06
PROTHROMBIN TIME: 32.3 s — AB (ref 11.4–15.2)

## 2017-01-05 LAB — PROTIME-INR
INR: 3.5
PROTHROMBIN TIME: 36 s — AB (ref 11.4–15.2)

## 2017-01-06 ENCOUNTER — Other Ambulatory Visit (HOSPITAL_COMMUNITY): Payer: Self-pay

## 2017-01-06 DIAGNOSIS — Z452 Encounter for adjustment and management of vascular access device: Secondary | ICD-10-CM | POA: Diagnosis not present

## 2017-01-06 DIAGNOSIS — Z4682 Encounter for fitting and adjustment of non-vascular catheter: Secondary | ICD-10-CM | POA: Diagnosis not present

## 2017-01-06 LAB — PROTIME-INR
INR: 2.8
PROTHROMBIN TIME: 30 s — AB (ref 11.4–15.2)

## 2017-01-07 ENCOUNTER — Other Ambulatory Visit (HOSPITAL_COMMUNITY): Payer: Self-pay

## 2017-01-07 ENCOUNTER — Encounter: Payer: Self-pay | Admitting: Anesthesiology

## 2017-01-07 DIAGNOSIS — J9600 Acute respiratory failure, unspecified whether with hypoxia or hypercapnia: Secondary | ICD-10-CM | POA: Diagnosis not present

## 2017-01-07 DIAGNOSIS — Z4682 Encounter for fitting and adjustment of non-vascular catheter: Secondary | ICD-10-CM | POA: Diagnosis not present

## 2017-01-07 LAB — PROTIME-INR
INR: 2.44
PROTHROMBIN TIME: 26.9 s — AB (ref 11.4–15.2)

## 2017-01-07 NOTE — Anesthesia Preprocedure Evaluation (Addendum)
Anesthesia Evaluation  Patient identified by MRN, date of birth, ID band Patient confused    Reviewed: Unable to perform ROS - Chart review onlyPreop documentation limited or incomplete due to emergent nature of procedure.  Airway Mallampati: I  TM Distance: >3 FB     Dental  (+) Edentulous Upper, Edentulous Lower   Pulmonary pneumonia, COPD, former smoker,           Cardiovascular hypertension, + CAD and +CHF       Neuro/Psych PSYCHIATRIC DISORDERS Depression Dementia CVA    GI/Hepatic Neg liver ROS, GERD  ,  Endo/Other    Renal/GU Renal InsufficiencyRenal disease     Musculoskeletal   Abdominal   Peds  Hematology   Anesthesia Other Findings   Reproductive/Obstetrics                             Anesthesia Physical Anesthesia Plan  ASA: IV and emergent  Anesthesia Plan:    Post-op Pain Management:    Induction:   Airway Management Planned: Oral ETT  Additional Equipment:   Intra-op Plan:   Post-operative Plan:   Informed Consent:   Plan Discussed with: CRNA and Anesthesiologist  Anesthesia Plan Comments: (Pt self extubated.  Anesthesia called for emergent intubation.)       Anesthesia Quick Evaluation

## 2017-01-07 NOTE — Anesthesia Procedure Notes (Signed)
Procedure Name: Awake intubation Date/Time: 01/07/2017 3:01 PM Performed by: Haig Prophet Pre-anesthesia Checklist: Patient identified, Emergency Drugs available, Suction available, Patient being monitored and Timeout performed Patient Re-evaluated:Patient Re-evaluated prior to inductionOxygen Delivery Method: Ambu bag Preoxygenation: Pre-oxygenation with 100% oxygen Ventilation: Mask ventilation without difficulty Laryngoscope Size: Glidescope and 3 Grade View: Grade II Tube type: Subglottic suction tube Tube size: 7.0 mm Number of attempts: 1 Airway Equipment and Method: Video-laryngoscopy Placement Confirmation: ETT inserted through vocal cords under direct vision,  CO2 detector and breath sounds checked- equal and bilateral Secured at: 23 cm Tube secured with: securement device.

## 2017-01-07 NOTE — Addendum Note (Signed)
Addendum  created 01/07/17 1630 by Edmonia Caprio, CRNA   Anesthesia Attestations filed

## 2017-01-08 LAB — PROTIME-INR
INR: 1.99
PROTHROMBIN TIME: 22.8 s — AB (ref 11.4–15.2)

## 2017-01-09 LAB — PROTIME-INR
INR: 1.53
Prothrombin Time: 18.5 seconds — ABNORMAL HIGH (ref 11.4–15.2)

## 2017-01-10 LAB — BASIC METABOLIC PANEL
ANION GAP: 6 (ref 5–15)
BUN: 89 mg/dL — AB (ref 6–20)
CO2: 36 mmol/L — AB (ref 22–32)
Calcium: 9.4 mg/dL (ref 8.9–10.3)
Chloride: 109 mmol/L (ref 101–111)
Creatinine, Ser: 2.18 mg/dL — ABNORMAL HIGH (ref 0.61–1.24)
GFR calc Af Amer: 36 mL/min — ABNORMAL LOW (ref >60)
GFR calc non Af Amer: 31 mL/min — ABNORMAL LOW (ref >60)
Glucose, Bld: 268 mg/dL — ABNORMAL HIGH (ref 65–99)
POTASSIUM: 3.7 mmol/L (ref 3.5–5.1)
Sodium: 151 mmol/L — ABNORMAL HIGH (ref 135–145)

## 2017-01-10 LAB — PROTIME-INR
INR: 1.16
Prothrombin Time: 14.9 seconds (ref 11.4–15.2)

## 2017-01-11 LAB — PROTIME-INR
INR: 1.13
Prothrombin Time: 14.6 seconds (ref 11.4–15.2)

## 2017-01-12 LAB — PROTIME-INR
INR: 1.15
PROTHROMBIN TIME: 14.8 s (ref 11.4–15.2)

## 2017-01-13 ENCOUNTER — Encounter (HOSPITAL_COMMUNITY): Payer: Self-pay | Admitting: Certified Registered Nurse Anesthetist

## 2017-01-13 LAB — BASIC METABOLIC PANEL
ANION GAP: 13 (ref 5–15)
BUN: 88 mg/dL — ABNORMAL HIGH (ref 6–20)
CHLORIDE: 108 mmol/L (ref 101–111)
CO2: 32 mmol/L (ref 22–32)
Calcium: 10.3 mg/dL (ref 8.9–10.3)
Creatinine, Ser: 2.26 mg/dL — ABNORMAL HIGH (ref 0.61–1.24)
GFR calc non Af Amer: 30 mL/min — ABNORMAL LOW (ref >60)
GFR, EST AFRICAN AMERICAN: 34 mL/min — AB (ref >60)
Glucose, Bld: 136 mg/dL — ABNORMAL HIGH (ref 65–99)
Potassium: 4 mmol/L (ref 3.5–5.1)
SODIUM: 153 mmol/L — AB (ref 135–145)

## 2017-01-13 LAB — CBC
HEMATOCRIT: 41.3 % (ref 39.0–52.0)
HEMOGLOBIN: 12.9 g/dL — AB (ref 13.0–17.0)
MCH: 32.5 pg (ref 26.0–34.0)
MCHC: 31.2 g/dL (ref 30.0–36.0)
MCV: 104 fL — ABNORMAL HIGH (ref 78.0–100.0)
Platelets: 239 10*3/uL (ref 150–400)
RBC: 3.97 MIL/uL — AB (ref 4.22–5.81)
RDW: 15.2 % (ref 11.5–15.5)
WBC: 14.8 10*3/uL — AB (ref 4.0–10.5)

## 2017-01-13 LAB — PROTIME-INR
INR: 1.1
Prothrombin Time: 14.2 seconds (ref 11.4–15.2)

## 2017-01-14 ENCOUNTER — Inpatient Hospital Stay
Admission: RE | Admit: 2017-01-14 | Discharge: 2017-01-22 | Payer: Self-pay | Source: Home / Self Care | Attending: Internal Medicine | Admitting: Internal Medicine

## 2017-01-14 ENCOUNTER — Observation Stay (HOSPITAL_COMMUNITY): Payer: Medicare Other | Admitting: Anesthesiology

## 2017-01-14 ENCOUNTER — Encounter
Admission: AD | Disposition: A | Discharge status: Other Acute Inpatient Hospital | Payer: Self-pay | Source: Ambulatory Visit | Attending: Internal Medicine

## 2017-01-14 ENCOUNTER — Observation Stay (HOSPITAL_COMMUNITY)
Admission: RE | Admit: 2017-01-14 | Discharge: 2017-01-14 | Disposition: A | Discharge status: Other Acute Inpatient Hospital | Payer: Medicare Other | Attending: Otolaryngology | Admitting: Otolaryngology

## 2017-01-14 ENCOUNTER — Encounter (HOSPITAL_COMMUNITY)
Admission: RE | Disposition: A | Discharge status: Other Acute Inpatient Hospital | Payer: Self-pay | Source: Home / Self Care | Attending: Otolaryngology

## 2017-01-14 ENCOUNTER — Ambulatory Visit (HOSPITAL_COMMUNITY): Admission: RE | Admit: 2017-01-14 | Payer: Medicare Other | Source: Ambulatory Visit | Admitting: Otolaryngology

## 2017-01-14 ENCOUNTER — Encounter: Payer: Self-pay | Admitting: Certified Registered Nurse Anesthetist

## 2017-01-14 DIAGNOSIS — I69354 Hemiplegia and hemiparesis following cerebral infarction affecting left non-dominant side: Secondary | ICD-10-CM | POA: Insufficient documentation

## 2017-01-14 DIAGNOSIS — I2510 Atherosclerotic heart disease of native coronary artery without angina pectoris: Secondary | ICD-10-CM | POA: Diagnosis not present

## 2017-01-14 DIAGNOSIS — Z794 Long term (current) use of insulin: Secondary | ICD-10-CM | POA: Insufficient documentation

## 2017-01-14 DIAGNOSIS — Z7901 Long term (current) use of anticoagulants: Secondary | ICD-10-CM | POA: Insufficient documentation

## 2017-01-14 DIAGNOSIS — F0390 Unspecified dementia without behavioral disturbance: Secondary | ICD-10-CM | POA: Insufficient documentation

## 2017-01-14 DIAGNOSIS — Z955 Presence of coronary angioplasty implant and graft: Secondary | ICD-10-CM | POA: Diagnosis not present

## 2017-01-14 DIAGNOSIS — Z87891 Personal history of nicotine dependence: Secondary | ICD-10-CM | POA: Diagnosis not present

## 2017-01-14 DIAGNOSIS — Z0189 Encounter for other specified special examinations: Secondary | ICD-10-CM

## 2017-01-14 DIAGNOSIS — I255 Ischemic cardiomyopathy: Secondary | ICD-10-CM | POA: Insufficient documentation

## 2017-01-14 DIAGNOSIS — J9620 Acute and chronic respiratory failure, unspecified whether with hypoxia or hypercapnia: Principal | ICD-10-CM | POA: Insufficient documentation

## 2017-01-14 DIAGNOSIS — Z930 Tracheostomy status: Secondary | ICD-10-CM

## 2017-01-14 DIAGNOSIS — Z79899 Other long term (current) drug therapy: Secondary | ICD-10-CM | POA: Insufficient documentation

## 2017-01-14 DIAGNOSIS — K219 Gastro-esophageal reflux disease without esophagitis: Secondary | ICD-10-CM | POA: Diagnosis not present

## 2017-01-14 DIAGNOSIS — E119 Type 2 diabetes mellitus without complications: Secondary | ICD-10-CM | POA: Insufficient documentation

## 2017-01-14 DIAGNOSIS — I10 Essential (primary) hypertension: Secondary | ICD-10-CM | POA: Diagnosis not present

## 2017-01-14 DIAGNOSIS — J449 Chronic obstructive pulmonary disease, unspecified: Secondary | ICD-10-CM | POA: Insufficient documentation

## 2017-01-14 DIAGNOSIS — J9690 Respiratory failure, unspecified, unspecified whether with hypoxia or hypercapnia: Secondary | ICD-10-CM

## 2017-01-14 DIAGNOSIS — Z4659 Encounter for fitting and adjustment of other gastrointestinal appliance and device: Secondary | ICD-10-CM

## 2017-01-14 HISTORY — PX: TRACHEOSTOMY TUBE PLACEMENT: SHX814

## 2017-01-14 SURGERY — CREATION, TRACHEOSTOMY
Anesthesia: General | Site: Neck

## 2017-01-14 SURGERY — CREATION, TRACHEOSTOMY
Anesthesia: General

## 2017-01-14 MED ORDER — CEFAZOLIN SODIUM-DEXTROSE 2-3 GM-% IV SOLR
INTRAVENOUS | Status: DC | PRN
  Administered 2017-01-14: 2 g via INTRAVENOUS

## 2017-01-14 MED ORDER — FENTANYL CITRATE (PF) 250 MCG/5ML IJ SOLN
INTRAMUSCULAR | Status: DC | PRN
  Administered 2017-01-14: 100 ug via INTRAVENOUS

## 2017-01-14 MED ORDER — ROCURONIUM BROMIDE 100 MG/10ML IV SOLN
INTRAVENOUS | Status: DC | PRN
  Administered 2017-01-14: 50 mg via INTRAVENOUS

## 2017-01-14 MED ORDER — 0.9 % SODIUM CHLORIDE (POUR BTL) OPTIME
TOPICAL | Status: DC | PRN
  Administered 2017-01-14: 1000 mL

## 2017-01-14 MED ORDER — LACTATED RINGERS IV SOLN
INTRAVENOUS | Status: DC | PRN
  Administered 2017-01-14: 16:00:00 via INTRAVENOUS

## 2017-01-14 MED ORDER — STERILE WATER FOR IRRIGATION IR SOLN
Status: DC | PRN
  Administered 2017-01-14: 1000 mL

## 2017-01-14 MED ORDER — LIDOCAINE-EPINEPHRINE 2 %-1:100000 IJ SOLN
INTRAMUSCULAR | Status: AC
  Filled 2017-01-14: qty 1

## 2017-01-14 SURGICAL SUPPLY — 39 items
ATTRACTOMAT 16X20 MAGNETIC DRP (DRAPES) IMPLANT
BLADE SURG 15 STRL LF DISP TIS (BLADE) ×1 IMPLANT
BLADE SURG 15 STRL SS (BLADE) ×2
CLEANER TIP ELECTROSURG 2X2 (MISCELLANEOUS) ×3 IMPLANT
COVER SURGICAL LIGHT HANDLE (MISCELLANEOUS) ×3 IMPLANT
DRAPE PROXIMA HALF (DRAPES) IMPLANT
ELECT COATED BLADE 2.86 ST (ELECTRODE) ×3 IMPLANT
ELECT REM PT RETURN 9FT ADLT (ELECTROSURGICAL) ×3
ELECTRODE REM PT RTRN 9FT ADLT (ELECTROSURGICAL) ×1 IMPLANT
GAUZE SPONGE 4X4 16PLY XRAY LF (GAUZE/BANDAGES/DRESSINGS) ×3 IMPLANT
GEL ULTRASOUND 20GR AQUASONIC (MISCELLANEOUS) ×3 IMPLANT
GLOVE SS BIOGEL STRL SZ 7.5 (GLOVE) ×1 IMPLANT
GLOVE SUPERSENSE BIOGEL SZ 7.5 (GLOVE) ×2
GOWN STRL REUS W/ TWL LRG LVL3 (GOWN DISPOSABLE) ×1 IMPLANT
GOWN STRL REUS W/ TWL XL LVL3 (GOWN DISPOSABLE) ×1 IMPLANT
GOWN STRL REUS W/TWL LRG LVL3 (GOWN DISPOSABLE) ×2
GOWN STRL REUS W/TWL XL LVL3 (GOWN DISPOSABLE) ×2
HOLDER TRACH TUBE VELCRO 19.5 (MISCELLANEOUS) ×6 IMPLANT
KIT BASIN OR (CUSTOM PROCEDURE TRAY) ×3 IMPLANT
KIT ROOM TURNOVER OR (KITS) ×3 IMPLANT
KIT SUCTION CATH 14FR (SUCTIONS) ×3 IMPLANT
NEEDLE HYPO 25GX1X1/2 BEV (NEEDLE) IMPLANT
NS IRRIG 1000ML POUR BTL (IV SOLUTION) ×3 IMPLANT
PACK EENT II TURBAN DRAPE (CUSTOM PROCEDURE TRAY) ×3 IMPLANT
PAD ARMBOARD 7.5X6 YLW CONV (MISCELLANEOUS) ×6 IMPLANT
PENCIL BUTTON HOLSTER BLD 10FT (ELECTRODE) ×3 IMPLANT
SPONGE DRAIN TRACH 4X4 STRL 2S (GAUZE/BANDAGES/DRESSINGS) ×3 IMPLANT
SPONGE INTESTINAL PEANUT (DISPOSABLE) ×3 IMPLANT
SUT SILK 2 0 SH CR/8 (SUTURE) ×3 IMPLANT
SUT SILK 3 0 TIES 10X30 (SUTURE) IMPLANT
SYR 5ML LUER SLIP (SYRINGE) ×3 IMPLANT
SYR CONTROL 10ML LL (SYRINGE) ×3 IMPLANT
TOWEL OR 17X24 6PK STRL BLUE (TOWEL DISPOSABLE) ×3 IMPLANT
TOWEL OR 17X26 10 PK STRL BLUE (TOWEL DISPOSABLE) ×3 IMPLANT
TUBE CONNECTING 12'X1/4 (SUCTIONS) ×1
TUBE CONNECTING 12X1/4 (SUCTIONS) ×2 IMPLANT
TUBE TRACH SHILEY  6 DIST  CUF (TUBING) ×3 IMPLANT
TUBE TRACH SHILEY 10 DIST CUFF (TUBING) IMPLANT
TUBE TRACH SHILEY 8 DIST CUF (TUBING) IMPLANT

## 2017-01-14 NOTE — Op Note (Deleted)
  The note originally documented on this encounter has been moved the the encounter in which it belongs.  

## 2017-01-14 NOTE — Op Note (Signed)
NAMEBO, STORMENT              ACCOUNT NO.:  [id number]  MEDICAL RECORD NO.:  [id number]  LOCATION:                               FACILITY:  [facility]  PHYSICIAN:  Kristine Garbe. Ezzard Standing, M.D.DATE OF BIRTH:  04-01-1956  DATE OF PROCEDURE:  01/14/2017 DATE OF DISCHARGE:  01/14/2017                              OPERATIVE REPORT   PREOPERATIVE DIAGNOSIS:  Acute on chronic respiratory failure.  POSTOPERATIVE DIAGNOSIS:  Acute on chronic respiratory failure.  OPERATION PERFORMED:  Tracheotomy with a #6 Shiley tracheostomy tube.  SURGEON:  Kristine Garbe. Ezzard Standing, MD.  ANESTHESIA:  General endotracheal.  COMPLICATIONS:  None.  ESTIMATED BLOOD LOSS:  Minimal.  BRIEF CLINICAL NOTE:  Curtis Keller is a 61 year old gentleman, who has been admitted to [facility] for over a month status post recent hip fracture.  He has a history of congestive heart failure as well as arrhythmia and a history of stroke with chronic left-sided weakness.  He has been on Lovenox and aspirin.  His Lovenox was held 24 hours in advance.  He had a previous tracheotomy about a year ago.  He has failed repeated attempts at extubation.  He is taken to the operating room at this time for a tracheotomy.  DESCRIPTION OF PROCEDURE:  The patient was brought directly from [facility] on the ventilator to the operating room.  He was placed in the supine position and rolls were placed underneath his neck to help extend his neck.  The neck and old tracheotomy scar tissue were prepped with Betadine solution.  The previous scar from his previous tracheotomy a year ago was excised and dissection was carried down through the subcutaneous tissue with cautery.  The trachea was identified.  It was a little bit difficult to tell whether this was between the first and second or second and third tracheal rings, but a horizontal incision was made.  The endotracheal balloon was visualized. The endotracheal tube  was removed and a new #6 cuffed tracheotomy tube was inserted via the horizontal tracheotomy without any difficulty.  The patient was ventilated well.  The tracheotomy tube was secured with 2-0 silk sutures x4 and Velcro trach holder around the neck.  The patient was subsequently transferred back to [facility].          ______________________________ Kristine Garbe. Ezzard Standing, M.D.     CEN/MEDQ  D:  01/14/2017  T:  01/14/2017  Job:  [id number]  cc:   Kristine Garbe. Ezzard Standing, M.D.

## 2017-01-14 NOTE — H&P (Deleted)
  The note originally documented on this encounter has been moved the the encounter in which it belongs.  

## 2017-01-14 NOTE — Anesthesia Postprocedure Evaluation (Signed)
Anesthesia Post Note  Patient: Curtis Keller  Procedure(s) Performed: Procedure(s) (LRB): TRACHEOSTOMY (N/A)  Patient location during evaluation: SICU Anesthesia Type: General Level of consciousness: sedated Pain management: pain level controlled Vital Signs Assessment: post-procedure vital signs reviewed and stable Respiratory status: Trached  Cardiovascular status: stable Anesthetic complications: no       Last Vitals: There were no vitals filed for this visit.  Last Pain: There were no vitals filed for this visit.               Kennieth Rad

## 2017-01-14 NOTE — Anesthesia Preprocedure Evaluation (Addendum)
Anesthesia Evaluation  Patient identified by MRN, date of birth, ID band Patient confused    Reviewed: NPO status , Patient's Chart, lab work & pertinent test results, Unable to perform ROS - Chart review only  Airway Mallampati: Intubated       Dental   Pulmonary pneumonia, COPD, former smoker,    breath sounds clear to auscultation       Cardiovascular hypertension, + CAD and +CHF   Rhythm:Regular Rate:Normal     Neuro/Psych CVA    GI/Hepatic GERD  ,  Endo/Other  diabetes  Renal/GU      Musculoskeletal   Abdominal   Peds  Hematology  (+) anemia ,   Anesthesia Other Findings   Reproductive/Obstetrics                            Anesthesia Physical Anesthesia Plan  ASA: IV  Anesthesia Plan: General   Post-op Pain Management:    Induction: Intravenous  Airway Management Planned: Oral ETT  Additional Equipment:   Intra-op Plan:   Post-operative Plan: Post-operative intubation/ventilation  Informed Consent: I have reviewed the patients History and Physical, chart, labs and discussed the procedure including the risks, benefits and alternatives for the proposed anesthesia with the patient or authorized representative who has indicated his/her understanding and acceptance.   History available from chart only  Plan Discussed with: CRNA, Anesthesiologist and Surgeon  Anesthesia Plan Comments:        Anesthesia Quick Evaluation

## 2017-01-14 NOTE — H&P (Signed)
PREOPERATIVE H&P  Chief Complaint: acute on chronic respiratory failure  HPI: Curtis Keller is a 61 y.o. male who presents for evaluation of tracheostomy.Patient with history of CHF as well as CVA with chronic left sided weakness. He was admitted to [facility] on 2/20 after sustaining a hip fracture while undergoing rehab. He's had history of aspiration requiring intubation. Attempts to wean and extubation have been unsuccessful. Last intubated on 3/6. He's had a previous trach performed about a year ago. He's taken to the hospital for tracheotomy.  Past Medical History:  Diagnosis Date  . Acute respiratory failure (HCC)    a. Spring 2017 following fall/splenectomy -->admitted to [facility]-->trach/g tube.  . Anemia    a. 12/2015 ABL in setting of fall/hematomas/splenic laceration req splenectomy.  Marland Kitchen Apical mural thrombus    a. 11/2015 Echo: EF 25-30%, moderate size apical thrombus-->coumadin;  b. 12/2015 f/u Echo: EF 25-30%, ant/septa HK, no obvious large thrombus but cannot exclude small mural thrombus.  Marland Kitchen CAD (coronary artery disease)    a. 12/2014 s/p BMS to the RCA.  Marland Kitchen Cerebral infarction (HCC)   . Chronic systolic CHF (congestive heart failure) (HCC)    a. 12/2015 Echo: EF 25-30%.  . Dementia   . Depression   . Dysphagia   . Falls    a. 11/2015 traumatic fall with resultant trauma req splenectomy and prolonged hospitalization complicated by resp failure.  Marland Kitchen History of pneumonia   . Hyperlipidemia   . Hypertension   . Ischemic cardiomyopathy    a. 12/2015 Echo: EF 25-30%, anterior and septal HK.  Marland Kitchen Left hemiplegia (HCC)   . Protein calorie malnutrition (HCC)   . Respiratory failure (HCC)   . Spleen injury   . Status post tracheostomy (HCC)    a. 01/2016 in setting of ongoing resp failure and aspiration.   Past Surgical History:  Procedure Laterality Date  . CARDIAC CATHETERIZATION    . CORONARY ANGIOPLASTY WITH STENT PLACEMENT    . IR GENERIC HISTORICAL  07/22/2016   IR  GASTROSTOMY TUBE REMOVAL 07/22/2016 [name], MD [facility]-INTERV RAD  . PR LAP, SPLENECTOMY  01/07/2016  . TRACHEOSTOMY TUBE PLACEMENT N/A 02/22/2016   Procedure: TRACHEOSTOMY;  Surgeon: [name], MD;  Location: [facility] OR;  Service: ENT;  Laterality: N/A;   Social History   Social History  . Marital status: Single    Spouse name: N/A  . Number of children: N/A  . Years of education: N/A   Social History Main Topics  . Smoking status: Former Smoker    Packs/day: 2.00    Years: 38.00    Types: Cigarettes  . Smokeless tobacco: Never Used     Comment: quit spring of 2017.  Marland Kitchen Alcohol use No  . Drug use: No  . Sexual activity: Not Asked   Other Topics Concern  . None   Social History Narrative   Lives @ SNF since Spring 2017.  Sedentary.  Unsteady on his feet.  Falls about 1x/wk.   Family History  Problem Relation Age of Onset  . Alcohol abuse Mother   . Hypertension Mother   . Coronary artery disease Mother   . Alcohol abuse Father   . Hypertension Father   . Coronary artery disease Father   . Alcohol abuse Maternal Uncle    Allergies  Allergen Reactions  . Codeine Anaphylaxis and Nausea And Vomiting  . Hydrocodone Other (See Comments)    On MAR  . Hydrocodone-Acetaminophen Nausea And Vomiting  .  Tegaderm Ag Mesh [Silver] Other (See Comments)    On MAR  . Lisinopril Itching and Rash  . Tylenol [Acetaminophen] Nausea Only   Prior to Admission medications   Medication Sig Start Date End Date Taking? Authorizing Provider  Amino Acids-Protein Hydrolys (FEEDING SUPPLEMENT, PRO-STAT SUGAR FREE 64,) LIQD Take 60 mLs by mouth 2 (two) times daily. Pro Stat Awc 17-100 gram-kcal     Historical Provider, MD  amoxicillin-clavulanate (AUGMENTIN) 875-125 MG tablet Take 1 tablet by mouth every 12 (twelve) hours. 5 more days 12/13/16   Leroy Sea, MD  atorvastatin (LIPITOR) 40 MG tablet Take 40 mg by mouth at bedtime.    Historical Provider, MD  bisacodyl (DULCOLAX) 10 MG  suppository Place 1 suppository (10 mg total) rectally 2 (two) times daily. 12/13/16   Leroy Sea, MD  busPIRone (BUSPAR) 10 MG tablet Take 10 mg by mouth 2 (two) times daily.    Historical Provider, MD  divalproex (DEPAKOTE) 125 MG DR tablet Take 125 mg by mouth 3 (three) times daily.    Historical Provider, MD  enoxaparin (LOVENOX) 100 MG/ML injection Inject 1.1 mLs (110 mg total) into the skin every 12 (twelve) hours. Stop once INR reaches 2 12/13/16   Leroy Sea, MD  famotidine (PEPCID) 20 MG tablet Take 20 mg by mouth 2 (two) times daily.    Historical Provider, MD  FLUoxetine HCl 60 MG TABS Take 1 tablet by mouth daily.    Historical Provider, MD  hydrALAZINE (APRESOLINE) 25 MG tablet Take 0.5 tablets (12.5 mg total) by mouth 2 times daily at 12 noon and 4 pm. 12/13/16   Leroy Sea, MD  insulin aspart (NOVOLOG) 100 UNIT/ML injection Before each meal 3 times a day, 140-199 - 2 units, 200-250 - 4 units, 251-299 - 6 units,  300-349 - 8 units,  350 or above 10 units. Dispense syringes and needles as needed, Ok to switch to PEN if approved. Substitute to any brand approved. DX DM2, Code E11.65 12/13/16   Leroy Sea, MD  ipratropium-albuterol (DUONEB) 0.5-2.5 (3) MG/3ML SOLN Take 3 mLs by nebulization 2 (two) times daily.     Historical Provider, MD  lidocaine (LIDODERM) 5 % Place 1 patch onto the skin daily. Remove & Discard patch within 12 hours or as directed by MD 12/13/16   Leroy Sea, MD  Menthol, Topical Analgesic, (BIOFREEZE EX) Apply 1 application topically daily as needed (pain).     Historical Provider, MD  metoprolol succinate (TOPROL-XL) 25 MG 24 hr tablet Take 25 mg by mouth 2 (two) times daily.    Historical Provider, MD  Omega-3 Fatty Acids (FISH OIL PO) Take 1 g by mouth daily. Reported on 03/10/2016     Historical Provider, MD  OxyCODONE HCl, Abuse Deter, (OXAYDO) 5 MG TABA Take 5 mg by mouth every 4 (four) hours as needed. 12/13/16   Leroy Sea, MD   OXYGEN Inhale 3 L into the lungs daily as needed (SOB).     Historical Provider, MD  potassium chloride SA (K-DUR,KLOR-CON) 20 MEQ tablet Take 1 tablet (20 mEq total) by mouth daily. 12/13/16   Leroy Sea, MD  pregabalin (LYRICA) 75 MG capsule Take 75 mg by mouth 2 (two) times daily.    Historical Provider, MD  Sennosides (SENNA LAX PO) Take 2 tablets by mouth 2 (two) times daily.     Historical Provider, MD  torsemide (DEMADEX) 10 MG tablet Take 10 mg by mouth daily.  Historical Provider, MD  vitamin B-12 (CYANOCOBALAMIN) 1000 MCG tablet Take 1,000 mcg by mouth daily.    Historical Provider, MD  vitamin C (ASCORBIC ACID) 500 MG tablet Take 500 mg by mouth 2 (two) times daily. 10,22    Historical Provider, MD  warfarin (COUMADIN) 4 MG tablet Take 3.5 mg by mouth at bedtime. Every evening     Historical Provider, MD  zinc sulfate 220 (50 Zn) MG capsule Take 220 mg by mouth daily.     Historical Provider, MD     Positive ROS: intubated   All other systems have been reviewed and were otherwise negative with the exception of those mentioned in the HPI and as above.  Physical Exam: There were no vitals filed for this visit.  General: Alert, no acute distress. Intubated but responsive and agrees with trach. Neck: No palpable adenopathy or thyroid nodules. Trach midline Cardiovascular: Regular rate and rhythm, no murmur.  Respiratory: Clear to auscultation Neurologic: Alert and oriented x 3   Assessment/Plan: RESPRITORY FAILURE Plan for Procedure(s): TRACHEOSTOMY   Dillard Cannon, MD 01/14/2017 3:14 PM

## 2017-01-14 NOTE — Anesthesia Preprocedure Evaluation (Addendum)
Anesthesia Evaluation  Patient identified by MRN, date of birth, ID band Patient awake    Reviewed: Allergy & Precautions, NPO status , Patient's Chart, lab work & pertinent test results  Airway Mallampati: Intubated   Neck ROM: Full    Dental no notable dental hx.    Pulmonary COPD, former smoker,    Pulmonary exam normal breath sounds clear to auscultation (-) decreased breath sounds      Cardiovascular hypertension, Pt. on medications + CAD, + Cardiac Stents and +CHF  negative cardio ROS Normal cardiovascular exam Rhythm:Regular Rate:Normal     Neuro/Psych CVA, Residual Symptoms negative psych ROS   GI/Hepatic Neg liver ROS, GERD  ,  Endo/Other  diabetes  Renal/GU CRFRenal disease  negative genitourinary   Musculoskeletal negative musculoskeletal ROS (+)   Abdominal   Peds  Hematology  (+) anemia ,   Anesthesia Other Findings   Reproductive/Obstetrics                             Lab Results  Component Value Date   WBC 14.8 (H) 01/13/2017   HGB 12.9 (L) 01/13/2017   HCT 41.3 01/13/2017   MCV 104.0 (H) 01/13/2017   PLT 239 01/13/2017   Lab Results  Component Value Date   CREATININE 2.26 (H) 01/13/2017   BUN 88 (H) 01/13/2017   NA 153 (H) 01/13/2017   K 4.0 01/13/2017   CL 108 01/13/2017   CO2 32 01/13/2017    Anesthesia Physical  Anesthesia Plan  ASA: IV  Anesthesia Plan: General   Post-op Pain Management:    Induction: Inhalational  Airway Management Planned: Oral ETT  Additional Equipment:   Intra-op Plan:   Post-operative Plan: Post-operative intubation/ventilation  Informed Consent: I have reviewed the patients History and Physical, chart, labs and discussed the procedure including the risks, benefits and alternatives for the proposed anesthesia with the patient or authorized representative who has indicated his/her understanding and acceptance.   Dental  advisory given  Plan Discussed with: CRNA  Anesthesia Plan Comments:        Anesthesia Quick Evaluation

## 2017-01-14 NOTE — Brief Op Note (Signed)
01/14/2017  4:53 PM  PATIENT:  Curtis Keller  61 y.o. male  PRE-OPERATIVE DIAGNOSIS:  RESP FAIL  POST-OPERATIVE DIAGNOSIS:  RESP FAIL  PROCEDURE:  Procedure(s) with comments: TRACHEOSTOMY (N/A) - inserted #6DCT OMQ#[id number]  SURGEON:  Surgeon(s) and Role:    * [name], MD - Primary  PHYSICIAN ASSISTANT:   ASSISTANTS: none   ANESTHESIA:   general  EBL:  Total I/O In: 300 [I.V.:300] Out: -   BLOOD ADMINISTERED:none  DRAINS: none   LOCAL MEDICATIONS USED:  NONE  SPECIMEN:  No Specimen  DISPOSITION OF SPECIMEN:  N/A  COUNTS:  YES  TOURNIQUET:  * No tourniquets in log *  DICTATION: .Other Dictation: Dictation Number [id number]  PLAN OF CARE: Discharge to home after PACU  PATIENT DISPOSITION:  PACU - hemodynamically stable.   Delay start of Pharmacological VTE agent (>24hrs) due to surgical blood loss or risk of bleeding: not applicable

## 2017-01-14 NOTE — Transfer of Care (Signed)
Immediate Anesthesia Transfer of Care Note  Patient: Curtis Keller  Procedure(s) Performed: Procedure(s) with comments: TRACHEOSTOMY (N/A) - inserted #6DCT VFM#[id number]  Patient Location: ICU  Anesthesia Type:General  Level of Consciousness: sedated and Patient remains intubated per anesthesia plan  Airway & Oxygen Therapy: Patient remains intubated per anesthesia plan and Patient placed on Ventilator (see vital sign flow sheet for setting)  Post-op Assessment: Report given to RN and Post -op Vital signs reviewed and stable  Post vital signs: Reviewed and stable  Last Vitals: There were no vitals filed for this visit.  Last Pain: There were no vitals filed for this visit.       Complications: No apparent anesthesia complications

## 2017-01-15 ENCOUNTER — Encounter (HOSPITAL_COMMUNITY): Payer: Self-pay | Admitting: Otolaryngology

## 2017-01-16 LAB — CBC WITH DIFFERENTIAL/PLATELET
BASOS PCT: 1 %
Basophils Absolute: 0.2 10*3/uL — ABNORMAL HIGH (ref 0.0–0.1)
EOS ABS: 1.1 10*3/uL — AB (ref 0.0–0.7)
Eosinophils Relative: 7 %
HCT: 41.1 % (ref 39.0–52.0)
Hemoglobin: 12.7 g/dL — ABNORMAL LOW (ref 13.0–17.0)
Lymphocytes Relative: 24 %
Lymphs Abs: 3.6 10*3/uL (ref 0.7–4.0)
MCH: 32.1 pg (ref 26.0–34.0)
MCHC: 30.9 g/dL (ref 30.0–36.0)
MCV: 103.8 fL — AB (ref 78.0–100.0)
MONO ABS: 1.2 10*3/uL — AB (ref 0.1–1.0)
Monocytes Relative: 8 %
NEUTROS ABS: 9.1 10*3/uL — AB (ref 1.7–7.7)
Neutrophils Relative %: 60 %
PLATELETS: 178 10*3/uL (ref 150–400)
RBC: 3.96 MIL/uL — ABNORMAL LOW (ref 4.22–5.81)
RDW: 15.8 % — AB (ref 11.5–15.5)
WBC: 15.2 10*3/uL — ABNORMAL HIGH (ref 4.0–10.5)

## 2017-01-16 LAB — HEPATIC FUNCTION PANEL
ALK PHOS: 131 U/L — AB (ref 38–126)
ALT: 23 U/L (ref 17–63)
AST: 34 U/L (ref 15–41)
Albumin: 2.1 g/dL — ABNORMAL LOW (ref 3.5–5.0)
BILIRUBIN TOTAL: 0.5 mg/dL (ref 0.3–1.2)
Total Protein: 7.3 g/dL (ref 6.5–8.1)

## 2017-01-16 LAB — MAGNESIUM: Magnesium: 2.4 mg/dL (ref 1.7–2.4)

## 2017-01-16 LAB — BASIC METABOLIC PANEL
Anion gap: 9 (ref 5–15)
BUN: 66 mg/dL — AB (ref 6–20)
CO2: 35 mmol/L — AB (ref 22–32)
Calcium: 10.3 mg/dL (ref 8.9–10.3)
Chloride: 108 mmol/L (ref 101–111)
Creatinine, Ser: 2.47 mg/dL — ABNORMAL HIGH (ref 0.61–1.24)
GFR calc Af Amer: 31 mL/min — ABNORMAL LOW (ref >60)
GFR, EST NON AFRICAN AMERICAN: 27 mL/min — AB (ref >60)
GLUCOSE: 189 mg/dL — AB (ref 65–99)
POTASSIUM: 3.6 mmol/L (ref 3.5–5.1)
Sodium: 152 mmol/L — ABNORMAL HIGH (ref 135–145)

## 2017-01-17 ENCOUNTER — Other Ambulatory Visit (HOSPITAL_COMMUNITY): Payer: Self-pay

## 2017-01-17 DIAGNOSIS — J9690 Respiratory failure, unspecified, unspecified whether with hypoxia or hypercapnia: Secondary | ICD-10-CM | POA: Diagnosis not present

## 2017-01-17 LAB — PROTIME-INR
INR: 1.07
Prothrombin Time: 13.9 seconds (ref 11.4–15.2)

## 2017-01-18 LAB — PROTIME-INR
INR: 1.07
PROTHROMBIN TIME: 13.9 s (ref 11.4–15.2)

## 2017-01-19 ENCOUNTER — Other Ambulatory Visit (HOSPITAL_COMMUNITY): Payer: Self-pay

## 2017-01-19 DIAGNOSIS — Z4682 Encounter for fitting and adjustment of non-vascular catheter: Secondary | ICD-10-CM | POA: Diagnosis not present

## 2017-01-19 LAB — CULTURE, RESPIRATORY W GRAM STAIN

## 2017-01-19 LAB — PROTIME-INR
INR: 1.13
Prothrombin Time: 14.6 seconds (ref 11.4–15.2)

## 2017-01-19 LAB — CULTURE, RESPIRATORY

## 2017-01-19 NOTE — Progress Notes (Signed)
Patient ID: Curtis Keller, male   DOB: 25-Oct-1955, 61 y.o.   MRN: [id number] Patient is stabilizing he has a tracheostomy he is on nutrition support. Patient will need surgery for the left femoral neck fracture. We will plan for surgery next week.

## 2017-01-20 LAB — BASIC METABOLIC PANEL
Anion gap: 9 (ref 5–15)
BUN: 72 mg/dL — AB (ref 6–20)
CALCIUM: 10.6 mg/dL — AB (ref 8.9–10.3)
CHLORIDE: 104 mmol/L (ref 101–111)
CO2: 33 mmol/L — AB (ref 22–32)
CREATININE: 2.31 mg/dL — AB (ref 0.61–1.24)
GFR calc non Af Amer: 29 mL/min — ABNORMAL LOW (ref >60)
GFR, EST AFRICAN AMERICAN: 33 mL/min — AB (ref >60)
Glucose, Bld: 166 mg/dL — ABNORMAL HIGH (ref 65–99)
Potassium: 3.8 mmol/L (ref 3.5–5.1)
SODIUM: 146 mmol/L — AB (ref 135–145)

## 2017-01-20 LAB — CULTURE, FUNGUS WITHOUT SMEAR

## 2017-01-20 LAB — PROTIME-INR
INR: 1.24
PROTHROMBIN TIME: 15.7 s — AB (ref 11.4–15.2)

## 2017-01-20 LAB — CBC
HCT: 39.1 % (ref 39.0–52.0)
HEMOGLOBIN: 12.5 g/dL — AB (ref 13.0–17.0)
MCH: 32.4 pg (ref 26.0–34.0)
MCHC: 32 g/dL (ref 30.0–36.0)
MCV: 101.3 fL — ABNORMAL HIGH (ref 78.0–100.0)
Platelets: 155 10*3/uL (ref 150–400)
RBC: 3.86 MIL/uL — ABNORMAL LOW (ref 4.22–5.81)
RDW: 15 % (ref 11.5–15.5)
WBC: 16.5 10*3/uL — ABNORMAL HIGH (ref 4.0–10.5)

## 2017-01-21 ENCOUNTER — Other Ambulatory Visit (HOSPITAL_COMMUNITY): Payer: Self-pay

## 2017-01-21 DIAGNOSIS — Z4682 Encounter for fitting and adjustment of non-vascular catheter: Secondary | ICD-10-CM | POA: Diagnosis not present

## 2017-01-21 LAB — PROTIME-INR
INR: 1.18
Prothrombin Time: 15.1 seconds (ref 11.4–15.2)

## 2017-01-22 ENCOUNTER — Emergency Department (HOSPITAL_COMMUNITY): Payer: Medicare Other

## 2017-01-22 ENCOUNTER — Encounter (HOSPITAL_COMMUNITY): Payer: Self-pay | Admitting: Emergency Medicine

## 2017-01-22 ENCOUNTER — Emergency Department (HOSPITAL_COMMUNITY)
Admission: EM | Admit: 2017-01-22 | Discharge: 2017-01-22 | Disposition: A | Discharge status: Home / Self Care | Payer: Medicare Other | Attending: Emergency Medicine | Admitting: Emergency Medicine

## 2017-01-22 ENCOUNTER — Inpatient Hospital Stay (HOSPITAL_BASED_OUTPATIENT_CLINIC_OR_DEPARTMENT_OTHER)
Admit: 2017-01-22 | Discharge: 2017-03-16 | Disposition: A | Discharge status: Skilled Nursing Facility | Payer: Medicare Other | Source: Ambulatory Visit | Attending: Internal Medicine | Admitting: Internal Medicine

## 2017-01-22 DIAGNOSIS — J939 Pneumothorax, unspecified: Secondary | ICD-10-CM

## 2017-01-22 DIAGNOSIS — I5022 Chronic systolic (congestive) heart failure: Secondary | ICD-10-CM | POA: Diagnosis not present

## 2017-01-22 DIAGNOSIS — J9383 Other pneumothorax: Secondary | ICD-10-CM | POA: Diagnosis not present

## 2017-01-22 DIAGNOSIS — J9690 Respiratory failure, unspecified, unspecified whether with hypoxia or hypercapnia: Secondary | ICD-10-CM

## 2017-01-22 DIAGNOSIS — I2510 Atherosclerotic heart disease of native coronary artery without angina pectoris: Secondary | ICD-10-CM | POA: Diagnosis not present

## 2017-01-22 DIAGNOSIS — I517 Cardiomegaly: Secondary | ICD-10-CM | POA: Diagnosis not present

## 2017-01-22 DIAGNOSIS — T148XXA Other injury of unspecified body region, initial encounter: Secondary | ICD-10-CM

## 2017-01-22 DIAGNOSIS — E119 Type 2 diabetes mellitus without complications: Secondary | ICD-10-CM | POA: Insufficient documentation

## 2017-01-22 DIAGNOSIS — I110 Hypertensive heart disease with heart failure: Secondary | ICD-10-CM | POA: Diagnosis not present

## 2017-01-22 DIAGNOSIS — J982 Interstitial emphysema: Secondary | ICD-10-CM | POA: Diagnosis not present

## 2017-01-22 DIAGNOSIS — Z7901 Long term (current) use of anticoagulants: Secondary | ICD-10-CM | POA: Insufficient documentation

## 2017-01-22 DIAGNOSIS — J189 Pneumonia, unspecified organism: Secondary | ICD-10-CM

## 2017-01-22 DIAGNOSIS — Z0189 Encounter for other specified special examinations: Secondary | ICD-10-CM

## 2017-01-22 DIAGNOSIS — Z955 Presence of coronary angioplasty implant and graft: Secondary | ICD-10-CM | POA: Insufficient documentation

## 2017-01-22 DIAGNOSIS — R1310 Dysphagia, unspecified: Secondary | ICD-10-CM

## 2017-01-22 DIAGNOSIS — S72002K Fracture of unspecified part of neck of left femur, subsequent encounter for closed fracture with nonunion: Secondary | ICD-10-CM

## 2017-01-22 DIAGNOSIS — Z87891 Personal history of nicotine dependence: Secondary | ICD-10-CM | POA: Diagnosis not present

## 2017-01-22 DIAGNOSIS — Z4659 Encounter for fitting and adjustment of other gastrointestinal appliance and device: Secondary | ICD-10-CM

## 2017-01-22 DIAGNOSIS — Z794 Long term (current) use of insulin: Secondary | ICD-10-CM | POA: Insufficient documentation

## 2017-01-22 DIAGNOSIS — Z431 Encounter for attention to gastrostomy: Secondary | ICD-10-CM

## 2017-01-22 DIAGNOSIS — Z4682 Encounter for fitting and adjustment of non-vascular catheter: Secondary | ICD-10-CM | POA: Diagnosis not present

## 2017-01-22 DIAGNOSIS — Z79899 Other long term (current) drug therapy: Secondary | ICD-10-CM | POA: Insufficient documentation

## 2017-01-22 MED ORDER — LIDOCAINE-EPINEPHRINE (PF) 2 %-1:200000 IJ SOLN
20.0000 mL | Freq: Once | INTRAMUSCULAR | Status: AC
  Administered 2017-01-22: 20 mL
  Filled 2017-01-22: qty 20

## 2017-01-22 NOTE — Progress Notes (Signed)
  Pt was placed on the vent per O2 desaturation during the chest tube procedure. MD at bedside/aware of oxygen decomposition.

## 2017-01-22 NOTE — ED Triage Notes (Signed)
Brought by staff from [facility] on bed.  Per nurse patient decanulated self tonight.  Reinserted without difficulty.  Noticed subq Emphysema and did a chest xray.  Showing left sided pneumothorax.  Per staff may go back to [facility] once chest tube is inserted and patient is stable.

## 2017-01-22 NOTE — ED Provider Notes (Signed)
[facility]-EMERGENCY DEPT Provider Note   CSN: [id number] Arrival date & time: 01/22/17  0207  By signing my name below, I, Elder Negus, attest that this documentation has been prepared under the direction and in the presence of Shon Baton, MD. Electronically Signed: Elder Negus, Scribe. 01/22/17. 2:29 AM.   History   Chief Complaint Chief Complaint  Patient presents with  . Chest Injury    HPI Curtis Keller is a 61 y.o. male with history of CHF, prior CVA with L sided deficits, and dementia who presents to the ED with a trach problem. This patient was admitted and underwent tracheostomy for respiratory failure on 3/24. He was subsequently discharged to an inpatient rehab facility. In the last 24 hours nursing staff have noticed the patient occasionally self-decannulating. They obtained plain films of the chest several hours ago after noting subcutaneous air which demonstrated L sided pneumothorax. Sent here for chest tube.   The patient is unable to provide history  HPI  Past Medical History:  Diagnosis Date  . Acute respiratory failure (HCC)    a. Spring 2017 following fall/splenectomy -->admitted to [facility]-->trach/g tube.  . Anemia    a. 12/2015 ABL in setting of fall/hematomas/splenic laceration req splenectomy.  Marland Kitchen Apical mural thrombus    a. 11/2015 Echo: EF 25-30%, moderate size apical thrombus-->coumadin;  b. 12/2015 f/u Echo: EF 25-30%, ant/septa HK, no obvious large thrombus but cannot exclude small mural thrombus.  Marland Kitchen CAD (coronary artery disease)    a. 12/2014 s/p BMS to the RCA.  Marland Kitchen Cerebral infarction (HCC)   . Chronic systolic CHF (congestive heart failure) (HCC)    a. 12/2015 Echo: EF 25-30%.  . Dementia   . Depression   . Dysphagia   . Falls    a. 11/2015 traumatic fall with resultant trauma req splenectomy and prolonged hospitalization complicated by resp failure.  Marland Kitchen History of pneumonia   . Hyperlipidemia   . Hypertension   . Ischemic  cardiomyopathy    a. 12/2015 Echo: EF 25-30%, anterior and septal HK.  Marland Kitchen Left hemiplegia (HCC)   . Protein calorie malnutrition (HCC)   . Respiratory failure (HCC)   . Spleen injury   . Status post tracheostomy (HCC)    a. 01/2016 in setting of ongoing resp failure and aspiration.    Patient Active Problem List   Diagnosis Date Noted  . Encounter for hospice care discussion   . DNR (do not resuscitate)   . Ischemic cardiomyopathy   . Goals of care, counseling/discussion   . Palliative care encounter   . Acute systolic congestive heart failure (HCC)   . Acute and chronic respiratory failure with hypoxia (HCC) 11/28/2016  . Hip fracture (HCC) 11/27/2016  . Closed nondisplaced intertrochanteric fracture of left femur (HCC) 11/27/2016  . Chronic respiratory failure (HCC) 11/27/2016  . Fall at nursing home 07/17/2016  . Diabetes mellitus, type II, insulin dependent (HCC) 06/12/2016  . GERD (gastroesophageal reflux disease) 05/07/2016  . COPD (chronic obstructive pulmonary disease) (HCC) 04/18/2016  . Chronic anticoagulation 03/22/2016  . S/P splenectomy 03/13/2016  . PEG (percutaneous endoscopic gastrostomy) status (HCC) 03/13/2016  . MRSA (methicillin resistant Staphylococcus aureus) septicemia (HCC) 03/13/2016  . Aspiration pneumonia (HCC) 03/13/2016  . Dysphagia 03/13/2016  . Acute blood loss anemia 03/13/2016  . Hyperlipidemia 03/13/2016  . Heart failure, chronic systolic (HCC)   . Hypertension   . Depression   . Status post tracheostomy (HCC)   . Cardiomyopathy (HCC)   . Apical mural  thrombus   . Protein calorie malnutrition (HCC)   . Intraparenchymal hematoma of brain due to trauma [facility])     Past Surgical History:  Procedure Laterality Date  . CARDIAC CATHETERIZATION    . CORONARY ANGIOPLASTY WITH STENT PLACEMENT    . IR GENERIC HISTORICAL  07/22/2016   IR GASTROSTOMY TUBE REMOVAL 07/22/2016 [name], MD [facility]-INTERV RAD  . PR LAP, SPLENECTOMY  01/07/2016  . TRACHEOSTOMY  TUBE PLACEMENT N/A 02/22/2016   Procedure: TRACHEOSTOMY;  Surgeon: [name], MD;  Location: [facility] OR;  Service: ENT;  Laterality: N/A;  . TRACHEOSTOMY TUBE PLACEMENT N/A 01/14/2017   Procedure: TRACHEOSTOMY;  Surgeon: [name], MD;  Location: [facility] OR;  Service: ENT;  Laterality: N/A;  inserted #[id number] MVH#[id number]       Home Medications    Prior to Admission medications   Medication Sig Start Date End Date Taking? Authorizing Provider  Amino Acids-Protein Hydrolys (FEEDING SUPPLEMENT, PRO-STAT SUGAR FREE 64,) LIQD Take 60 mLs by mouth 2 (two) times daily. Pro Stat Awc 17-100 gram-kcal     Historical Provider, MD  amoxicillin-clavulanate (AUGMENTIN) 875-125 MG tablet Take 1 tablet by mouth every 12 (twelve) hours. 5 more days 12/13/16   Leroy Sea, MD  atorvastatin (LIPITOR) 40 MG tablet Take 40 mg by mouth at bedtime.    Historical Provider, MD  bisacodyl (DULCOLAX) 10 MG suppository Place 1 suppository (10 mg total) rectally 2 (two) times daily. 12/13/16   Leroy Sea, MD  busPIRone (BUSPAR) 10 MG tablet Take 10 mg by mouth 2 (two) times daily.    Historical Provider, MD  divalproex (DEPAKOTE) 125 MG DR tablet Take 125 mg by mouth 3 (three) times daily.    Historical Provider, MD  enoxaparin (LOVENOX) 100 MG/ML injection Inject 1.1 mLs (110 mg total) into the skin every 12 (twelve) hours. Stop once INR reaches 2 12/13/16   Leroy Sea, MD  famotidine (PEPCID) 20 MG tablet Take 20 mg by mouth 2 (two) times daily.    Historical Provider, MD  FLUoxetine HCl 60 MG TABS Take 1 tablet by mouth daily.    Historical Provider, MD  hydrALAZINE (APRESOLINE) 25 MG tablet Take 0.5 tablets (12.5 mg total) by mouth 2 times daily at 12 noon and 4 pm. 12/13/16   Leroy Sea, MD  insulin aspart (NOVOLOG) 100 UNIT/ML injection Before each meal 3 times a day, 140-199 - 2 units, 200-250 - 4 units, 251-299 - 6 units,  300-349 - 8 units,  350 or above 10 units. Dispense syringes and  needles as needed, Ok to switch to PEN if approved. Substitute to any brand approved. DX DM2, Code E11.65 12/13/16   Leroy Sea, MD  ipratropium-albuterol (DUONEB) 0.5-2.5 (3) MG/3ML SOLN Take 3 mLs by nebulization 2 (two) times daily.     Historical Provider, MD  lidocaine (LIDODERM) 5 % Place 1 patch onto the skin daily. Remove & Discard patch within 12 hours or as directed by MD 12/13/16   Leroy Sea, MD  Menthol, Topical Analgesic, (BIOFREEZE EX) Apply 1 application topically daily as needed (pain).     Historical Provider, MD  metoprolol succinate (TOPROL-XL) 25 MG 24 hr tablet Take 25 mg by mouth 2 (two) times daily.    Historical Provider, MD  Omega-3 Fatty Acids (FISH OIL PO) Take 1 g by mouth daily. Reported on 03/10/2016     Historical Provider, MD  OxyCODONE HCl, Abuse Deter, (OXAYDO) 5 MG TABA Take 5 mg  by mouth every 4 (four) hours as needed. 12/13/16   Leroy Sea, MD  OXYGEN Inhale 3 L into the lungs daily as needed (SOB).     Historical Provider, MD  potassium chloride SA (K-DUR,KLOR-CON) 20 MEQ tablet Take 1 tablet (20 mEq total) by mouth daily. 12/13/16   Leroy Sea, MD  pregabalin (LYRICA) 75 MG capsule Take 75 mg by mouth 2 (two) times daily.    Historical Provider, MD  Sennosides (SENNA LAX PO) Take 2 tablets by mouth 2 (two) times daily.     Historical Provider, MD  torsemide (DEMADEX) 10 MG tablet Take 10 mg by mouth daily.    Historical Provider, MD  vitamin B-12 (CYANOCOBALAMIN) 1000 MCG tablet Take 1,000 mcg by mouth daily.    Historical Provider, MD  vitamin C (ASCORBIC ACID) 500 MG tablet Take 500 mg by mouth 2 (two) times daily. 10,22    Historical Provider, MD  warfarin (COUMADIN) 4 MG tablet Take 3.5 mg by mouth at bedtime. Every evening     Historical Provider, MD  zinc sulfate 220 (50 Zn) MG capsule Take 220 mg by mouth daily.     Historical Provider, MD    Family History Family History  Problem Relation Age of Onset  . Alcohol abuse Mother   .  Hypertension Mother   . Coronary artery disease Mother   . Alcohol abuse Father   . Hypertension Father   . Coronary artery disease Father   . Alcohol abuse Maternal Uncle     Social History Social History  Substance Use Topics  . Smoking status: Former Smoker    Packs/day: 2.00    Years: 38.00    Types: Cigarettes  . Smokeless tobacco: Never Used     Comment: quit spring of 2017.  Marland Kitchen Alcohol use No     Allergies   Codeine; Hydrocodone; Hydrocodone-acetaminophen; Tegaderm ag mesh [silver]; Lisinopril; and Tylenol [acetaminophen]   Review of Systems Review of Systems  Unable to perform ROS: Mental status change     Physical Exam Updated Vital Signs BP (!) 145/107   Pulse (!) 104   Temp 98.2 F (36.8 C) (Oral)   Resp 20   Ht 6' (1.829 m)   Wt 256 lb (116.1 kg)   SpO2 99%   BMI 34.72 kg/m   Physical Exam  Constitutional:  Overweight, chronically ill-appearing, no acute distress  HENT:  Head: Normocephalic and atraumatic.  Eyes: Pupils are equal, round, and reactive to light.  Neck:  Trach in place Subcutaneous air noted left neck and chest wall  Cardiovascular: Regular rhythm and normal heart sounds.   No murmur heard. Tachycardia  Pulmonary/Chest: Effort normal and breath sounds normal. No respiratory distress.  Absent breath sounds left chest  Abdominal: Soft. Bowel sounds are normal. There is no tenderness. There is no rebound.  Musculoskeletal: He exhibits no edema.  Neurological: He is alert.  Skin: Skin is warm and dry.  Psychiatric: He has a normal mood and affect.  Nursing note and vitals reviewed.    ED Treatments / Results  Labs (all labs ordered are listed, but only abnormal results are displayed) Labs Reviewed - No data to display  EKG  EKG Interpretation None       Radiology Dg Chest Portable 1 View  Result Date: 01/22/2017 CLINICAL DATA:  Left-sided chest tube placement.  Initial encounter. EXAM: PORTABLE CHEST 1 VIEW  COMPARISON:  Chest radiograph performed 01/21/2017 FINDINGS: There has been interval placement of a  left basilar chest tube, with apparent mild interval improvement in the patient's moderate left-sided pneumothorax. However, there appears to be increased prominence of air surrounding the mediastinum. This is thought most likely to reflect worsening pneumomediastinum due to air tracking from the patient's tracheostomy tube. Worsening soft tissue air is seen along the left chest wall, and diffuse soft tissue air is again seen tracking about the neck. Patchy bilateral airspace opacity, more prominent at the left lung base, raises concern for multifocal pneumonia. No pleural effusion is seen. The cardiomediastinal silhouette is borderline enlarged. No acute osseous abnormalities are identified. IMPRESSION: 1. Interval placement of left basilar chest tube, with apparent mild interval improvement in the patient's moderate left-sided pneumothorax. 2. Increased prominence of air surrounding the mediastinum. This is thought most likely to reflect worsening pneumomediastinum due to air dissecting from the patient's tracheostomy tube. 3. Worsening soft tissue air along the left chest wall. Diffuse soft tissue air again seen tracking about the neck. 4. Patchy bilateral airspace opacity, particularly at the left lung base, raises concern for multifocal pneumonia, more prominent than on the prior studies. 5. Borderline cardiomegaly. Critical Value/emergent results were called by telephone at the time of interpretation on 01/22/2017 at 3:50 am to Dr. Ross Marcus, who verbally acknowledged these results. Electronically Signed   By: [name] M.D.   On: 01/22/2017 03:52   Dg Chest Port 1 View  Result Date: 01/22/2017 CLINICAL DATA:  Assess tracheostomy tube position. Initial encounter. EXAM: PORTABLE CHEST 1 VIEW COMPARISON:  Chest radiograph performed 01/17/2017 FINDINGS: The tracheostomy tube is noted ending 5-6 cm above  the carina. There is a large amount of new soft tissue air tracking about the neck and left chest wall. An associated moderate to large left-sided pneumothorax is seen, likely measuring at least 40-50% of left lung volume. New patchy right-sided airspace opacity may reflect interstitial edema or possibly infection. No pleural effusion is seen. The cardiomediastinal silhouette is borderline normal in size. No acute osseous abnormalities are identified. An enteric tube is noted extending below the diaphragm. IMPRESSION: 1. Moderate to large left-sided pneumothorax, likely measuring at least 40-50% of left lung volume, new from the prior study. 2. Large amount of new soft tissue air tracking about the neck and left chest wall. Given recent replacement of tracheostomy tube, the pneumothorax and soft tissue air likely reflects extension of air from the level of the tracheostomy. 3. New patchy right-sided airspace opacity may reflect interstitial edema or possibly infection. Critical Value/emergent results were called by telephone at the time of interpretation on 01/22/2017 at 12:40 am to Quenten Raven at [facility], who verbally acknowledged these results. Electronically Signed   By: [name] M.D.   On: 01/22/2017 00:44   Dg Abd Portable 1v  Result Date: 01/21/2017 CLINICAL DATA:  Nasogastric tube placement.  Initial encounter. EXAM: PORTABLE ABDOMEN - 1 VIEW COMPARISON:  Abdominal radiograph performed earlier today at 10:11 p.m. FINDINGS: The patient's enteric tube is noted ending overlying the fundus of the stomach. The visualized bowel gas pattern is grossly unremarkable. No free intra-abdominal air is seen, though evaluation for free air is limited on a single supine view. No acute osseous abnormalities are identified. IMPRESSION: Enteric tube noted ending overlying the fundus of the stomach. Electronically Signed   By: [name] M.D.   On: 01/21/2017 02:34    Procedures CHEST TUBE  INSERTION Date/Time: 01/22/2017 5:01 AM Performed by: Shon Baton Authorized by: Shon Baton   Consent:  Consent obtained:  Emergent situation   Alternatives discussed:  No treatment Pre-procedure details:    Skin preparation:  ChloraPrep   Preparation: Patient was prepped and draped in the usual sterile fashion   Sedation:    Sedation type:  Anxiolysis Anesthesia (see MAR for exact dosages):    Anesthesia method:  Local infiltration   Local anesthetic:  Lidocaine 2% WITH epi Procedure details:    Placement location:  L lateral   Tube size Janice Norrie): 14-gauge pigtail catheter.   Dissection instrument: Progressive dilation.   Ultrasound guidance: no     Tension pneumothorax: no     Tube connected to:  Suction   Drainage characteristics:  Air only   Suture material:  2-0 silk   Dressing:  4x4 sterile gauze Post-procedure details:    Post-insertion x-ray findings: tube in good position     Patient tolerance of procedure:  Tolerated well, no immediate complications   (including critical care time)  Medications Ordered in ED Medications  lidocaine-EPINEPHrine (XYLOCAINE W/EPI) 2 %-1:200000 (PF) injection 20 mL (20 mLs Infiltration Given 01/22/17 0223)     Initial Impression / Assessment and Plan / ED Course  I have reviewed the triage vital signs and the nursing notes.  Pertinent labs & imaging results that were available during my care of the patient were reviewed by me and considered in my medical decision making (see chart for details).  Clinical Course as of Jan 22 442  Sun Jan 22, 2017  1610 Discussed with Dr. Cicero Duck.  Chest tube in place. Worsening pneumomediastinum but improved aeration of the lung. Patient also has evidence of likely pneumonia. Primary M.D. is aware and will address upon admission to [facility].  [CH]    Clinical Course User Index [CH] Shon Baton, MD    Patient presents for placement of chest tube. Spontaneous left pneumothorax  likely related to recent decannulation and replacement of tracheostomy. Per primary M.D., Dr. Cicero Duck, chest tube can be managed upstairs. I confirmed left-sided pneumothorax on x-ray. Patient reportedly on Coumadin. His INR yesterday was 1.18. Feel this is sufficient to proceed. No acute consultations noted. Was called by radiology. Improved aeration of the left lung. Worsening pneumomediastinum. He also has worsening opacities. I discussed this with Dr. Cicero Duck as above who will review the images. Patient is currently being treated with antibiotics per his primary team. No further workup obtained at this time.  Patient discharged back to his living facility.  Final Clinical Impressions(s) / ED Diagnoses   Final diagnoses:  Pneumothorax, left  Pneumomediastinum (HCC)    New Prescriptions New Prescriptions   No medications on file   I personally performed the services described in this documentation, which was scribed in my presence. The recorded information has been reviewed and is accurate.    Shon Baton, MD 01/22/17 [phone]

## 2017-01-23 ENCOUNTER — Other Ambulatory Visit (HOSPITAL_COMMUNITY): Payer: Self-pay

## 2017-01-23 DIAGNOSIS — J9690 Respiratory failure, unspecified, unspecified whether with hypoxia or hypercapnia: Secondary | ICD-10-CM | POA: Diagnosis not present

## 2017-01-23 DIAGNOSIS — F418 Other specified anxiety disorders: Secondary | ICD-10-CM | POA: Insufficient documentation

## 2017-01-23 DIAGNOSIS — J939 Pneumothorax, unspecified: Secondary | ICD-10-CM | POA: Diagnosis not present

## 2017-01-23 LAB — BLOOD GAS, ARTERIAL
Acid-Base Excess: 10 mmol/L — ABNORMAL HIGH (ref 0.0–2.0)
BICARBONATE: 34.1 mmol/L — AB (ref 20.0–28.0)
FIO2: 40
O2 Saturation: 97.6 %
PATIENT TEMPERATURE: 96.6
PH ART: 7.495 — AB (ref 7.350–7.450)
pCO2 arterial: 44.2 mmHg (ref 32.0–48.0)
pO2, Arterial: 91 mmHg (ref 83.0–108.0)

## 2017-01-23 LAB — BASIC METABOLIC PANEL
ANION GAP: 11 (ref 5–15)
BUN: 77 mg/dL — ABNORMAL HIGH (ref 6–20)
CALCIUM: 11.1 mg/dL — AB (ref 8.9–10.3)
CHLORIDE: 105 mmol/L (ref 101–111)
CO2: 32 mmol/L (ref 22–32)
Creatinine, Ser: 2.93 mg/dL — ABNORMAL HIGH (ref 0.61–1.24)
GFR calc Af Amer: 25 mL/min — ABNORMAL LOW (ref >60)
GFR calc non Af Amer: 22 mL/min — ABNORMAL LOW (ref >60)
GLUCOSE: 163 mg/dL — AB (ref 65–99)
Potassium: 3.8 mmol/L (ref 3.5–5.1)
Sodium: 148 mmol/L — ABNORMAL HIGH (ref 135–145)

## 2017-01-23 LAB — CBC
HEMATOCRIT: 40.8 % (ref 39.0–52.0)
Hemoglobin: 12.7 g/dL — ABNORMAL LOW (ref 13.0–17.0)
MCH: 31.9 pg (ref 26.0–34.0)
MCHC: 31.1 g/dL (ref 30.0–36.0)
MCV: 102.5 fL — AB (ref 78.0–100.0)
PLATELETS: 182 10*3/uL (ref 150–400)
RBC: 3.98 MIL/uL — AB (ref 4.22–5.81)
RDW: 15.5 % (ref 11.5–15.5)
WBC: 13.6 10*3/uL — AB (ref 4.0–10.5)

## 2017-01-24 ENCOUNTER — Other Ambulatory Visit (HOSPITAL_COMMUNITY): Payer: Self-pay

## 2017-01-24 DIAGNOSIS — Z4682 Encounter for fitting and adjustment of non-vascular catheter: Secondary | ICD-10-CM | POA: Diagnosis not present

## 2017-01-24 DIAGNOSIS — J9600 Acute respiratory failure, unspecified whether with hypoxia or hypercapnia: Secondary | ICD-10-CM | POA: Diagnosis not present

## 2017-01-25 LAB — BASIC METABOLIC PANEL
ANION GAP: 9 (ref 5–15)
BUN: 60 mg/dL — ABNORMAL HIGH (ref 6–20)
CALCIUM: 10.1 mg/dL (ref 8.9–10.3)
CO2: 31 mmol/L (ref 22–32)
Chloride: 107 mmol/L (ref 101–111)
Creatinine, Ser: 2.41 mg/dL — ABNORMAL HIGH (ref 0.61–1.24)
GFR calc non Af Amer: 27 mL/min — ABNORMAL LOW (ref >60)
GFR, EST AFRICAN AMERICAN: 32 mL/min — AB (ref >60)
GLUCOSE: 138 mg/dL — AB (ref 65–99)
Potassium: 3.3 mmol/L — ABNORMAL LOW (ref 3.5–5.1)
SODIUM: 147 mmol/L — AB (ref 135–145)

## 2017-01-26 ENCOUNTER — Other Ambulatory Visit (HOSPITAL_COMMUNITY): Payer: Self-pay

## 2017-01-26 DIAGNOSIS — Z4682 Encounter for fitting and adjustment of non-vascular catheter: Secondary | ICD-10-CM | POA: Diagnosis not present

## 2017-01-26 LAB — POTASSIUM: Potassium: 3.8 mmol/L (ref 3.5–5.1)

## 2017-01-27 ENCOUNTER — Other Ambulatory Visit (HOSPITAL_COMMUNITY): Payer: Self-pay

## 2017-01-27 DIAGNOSIS — Z4682 Encounter for fitting and adjustment of non-vascular catheter: Secondary | ICD-10-CM | POA: Diagnosis not present

## 2017-01-27 DIAGNOSIS — J939 Pneumothorax, unspecified: Secondary | ICD-10-CM | POA: Diagnosis not present

## 2017-01-28 ENCOUNTER — Other Ambulatory Visit (HOSPITAL_COMMUNITY): Payer: Self-pay

## 2017-01-28 DIAGNOSIS — J939 Pneumothorax, unspecified: Secondary | ICD-10-CM | POA: Diagnosis not present

## 2017-01-28 DIAGNOSIS — Z4682 Encounter for fitting and adjustment of non-vascular catheter: Secondary | ICD-10-CM | POA: Diagnosis not present

## 2017-01-28 LAB — BASIC METABOLIC PANEL
Anion gap: 11 (ref 5–15)
BUN: 43 mg/dL — AB (ref 6–20)
CO2: 28 mmol/L (ref 22–32)
CREATININE: 2.17 mg/dL — AB (ref 0.61–1.24)
Calcium: 9.6 mg/dL (ref 8.9–10.3)
Chloride: 113 mmol/L — ABNORMAL HIGH (ref 101–111)
GFR calc Af Amer: 36 mL/min — ABNORMAL LOW (ref >60)
GFR calc non Af Amer: 31 mL/min — ABNORMAL LOW (ref >60)
GLUCOSE: 86 mg/dL (ref 65–99)
Potassium: 4 mmol/L (ref 3.5–5.1)
Sodium: 152 mmol/L — ABNORMAL HIGH (ref 135–145)

## 2017-01-29 LAB — BASIC METABOLIC PANEL
Anion gap: 10 (ref 5–15)
BUN: 37 mg/dL — ABNORMAL HIGH (ref 6–20)
CALCIUM: 9.2 mg/dL (ref 8.9–10.3)
CO2: 28 mmol/L (ref 22–32)
CREATININE: 2.15 mg/dL — AB (ref 0.61–1.24)
Chloride: 110 mmol/L (ref 101–111)
GFR calc Af Amer: 36 mL/min — ABNORMAL LOW (ref >60)
GFR, EST NON AFRICAN AMERICAN: 31 mL/min — AB (ref >60)
GLUCOSE: 127 mg/dL — AB (ref 65–99)
Potassium: 3.9 mmol/L (ref 3.5–5.1)
SODIUM: 148 mmol/L — AB (ref 135–145)

## 2017-01-29 LAB — CBC
HEMATOCRIT: 41 % (ref 39.0–52.0)
Hemoglobin: 13 g/dL (ref 13.0–17.0)
MCH: 32.7 pg (ref 26.0–34.0)
MCHC: 31.7 g/dL (ref 30.0–36.0)
MCV: 103 fL — AB (ref 78.0–100.0)
Platelets: 192 10*3/uL (ref 150–400)
RBC: 3.98 MIL/uL — ABNORMAL LOW (ref 4.22–5.81)
RDW: 16.1 % — ABNORMAL HIGH (ref 11.5–15.5)
WBC: 9.1 10*3/uL (ref 4.0–10.5)

## 2017-01-30 ENCOUNTER — Other Ambulatory Visit (HOSPITAL_COMMUNITY): Payer: Self-pay

## 2017-01-30 DIAGNOSIS — K439 Ventral hernia without obstruction or gangrene: Secondary | ICD-10-CM | POA: Diagnosis not present

## 2017-01-31 ENCOUNTER — Other Ambulatory Visit (HOSPITAL_COMMUNITY): Payer: Self-pay

## 2017-01-31 DIAGNOSIS — R918 Other nonspecific abnormal finding of lung field: Secondary | ICD-10-CM | POA: Diagnosis not present

## 2017-01-31 DIAGNOSIS — J9811 Atelectasis: Secondary | ICD-10-CM | POA: Diagnosis not present

## 2017-01-31 DIAGNOSIS — R1310 Dysphagia, unspecified: Secondary | ICD-10-CM | POA: Diagnosis not present

## 2017-01-31 NOTE — Consult Note (Signed)
Chief Complaint: Patient was seen in consultation today for dysphagia  Referring Physician(s):  Hijazi,Ali  Supervising Physician: Irish Lack  Patient Status: [facility] - In-pt  History of Present Illness: Curtis Keller is a 61 y.o. male with past medical history of CAD, stroke, CHF, dementia, and respiratory failure s/p trach placement who now presents with dysphagia.  Patient in need of long-term alternative means of nutrition support.   IR consulted for percutaneous gastrostomy tube placement at the request of Dr. Sharyon Medicus. Imaging reviewed by Dr. Deanne Coffer who approves patient for procedure.    Past Medical History:  Diagnosis Date  . Acute respiratory failure (HCC)    a. Spring 2017 following fall/splenectomy -->admitted to [facility]-->trach/g tube.  . Anemia    a. 12/2015 ABL in setting of fall/hematomas/splenic laceration req splenectomy.  Marland Kitchen Apical mural thrombus    a. 11/2015 Echo: EF 25-30%, moderate size apical thrombus-->coumadin;  b. 12/2015 f/u Echo: EF 25-30%, ant/septa HK, no obvious large thrombus but cannot exclude small mural thrombus.  Marland Kitchen CAD (coronary artery disease)    a. 12/2014 s/p BMS to the RCA.  Marland Kitchen Cerebral infarction (HCC)   . Chronic systolic CHF (congestive heart failure) (HCC)    a. 12/2015 Echo: EF 25-30%.  . Dementia   . Depression   . Dysphagia   . Falls    a. 11/2015 traumatic fall with resultant trauma req splenectomy and prolonged hospitalization complicated by resp failure.  Marland Kitchen History of pneumonia   . Hyperlipidemia   . Hypertension   . Ischemic cardiomyopathy    a. 12/2015 Echo: EF 25-30%, anterior and septal HK.  Marland Kitchen Left hemiplegia (HCC)   . Protein calorie malnutrition (HCC)   . Respiratory failure (HCC)   . Spleen injury   . Status post tracheostomy (HCC)    a. 01/2016 in setting of ongoing resp failure and aspiration.    Past Surgical History:  Procedure Laterality Date  . CARDIAC CATHETERIZATION    . CORONARY  ANGIOPLASTY WITH STENT PLACEMENT    . IR GENERIC HISTORICAL  07/22/2016   IR GASTROSTOMY TUBE REMOVAL 07/22/2016 [name], MD [facility]-INTERV RAD  . PR LAP, SPLENECTOMY  01/07/2016  . TRACHEOSTOMY TUBE PLACEMENT N/A 02/22/2016   Procedure: TRACHEOSTOMY;  Surgeon: [name], MD;  Location: [facility] OR;  Service: ENT;  Laterality: N/A;  . TRACHEOSTOMY TUBE PLACEMENT N/A 01/14/2017   Procedure: TRACHEOSTOMY;  Surgeon: [name], MD;  Location: [facility] OR;  Service: ENT;  Laterality: N/A;  inserted #[id number] ONG#[id number]    Allergies: Codeine; Hydrocodone; Hydrocodone-acetaminophen; Tegaderm ag mesh [silver]; Lisinopril; and Tylenol [acetaminophen]  Medications: Prior to Admission medications   Medication Sig Start Date End Date Taking? Authorizing Provider  Amino Acids-Protein Hydrolys (FEEDING SUPPLEMENT, PRO-STAT SUGAR FREE 64,) LIQD Take 60 mLs by mouth 2 (two) times daily. Pro Stat Awc 17-100 gram-kcal     Historical Provider, MD  amoxicillin-clavulanate (AUGMENTIN) 875-125 MG tablet Take 1 tablet by mouth every 12 (twelve) hours. 5 more days 12/13/16   Leroy Sea, MD  atorvastatin (LIPITOR) 40 MG tablet Take 40 mg by mouth at bedtime.    Historical Provider, MD  bisacodyl (DULCOLAX) 10 MG suppository Place 1 suppository (10 mg total) rectally 2 (two) times daily. 12/13/16   Leroy Sea, MD  busPIRone (BUSPAR) 10 MG tablet Take 10 mg by mouth 2 (two) times daily.    Historical Provider, MD  divalproex (DEPAKOTE) 125 MG DR tablet Take 125 mg by mouth 3 (  three) times daily.    Historical Provider, MD  enoxaparin (LOVENOX) 100 MG/ML injection Inject 1.1 mLs (110 mg total) into the skin every 12 (twelve) hours. Stop once INR reaches 2 12/13/16   Leroy Sea, MD  famotidine (PEPCID) 20 MG tablet Take 20 mg by mouth 2 (two) times daily.    Historical Provider, MD  FLUoxetine HCl 60 MG TABS Take 1 tablet by mouth daily.    Historical Provider, MD  hydrALAZINE (APRESOLINE) 25 MG tablet  Take 0.5 tablets (12.5 mg total) by mouth 2 times daily at 12 noon and 4 pm. 12/13/16   Leroy Sea, MD  insulin aspart (NOVOLOG) 100 UNIT/ML injection Before each meal 3 times a day, 140-199 - 2 units, 200-250 - 4 units, 251-299 - 6 units,  300-349 - 8 units,  350 or above 10 units. Dispense syringes and needles as needed, Ok to switch to PEN if approved. Substitute to any brand approved. DX DM2, Code E11.65 12/13/16   Leroy Sea, MD  ipratropium-albuterol (DUONEB) 0.5-2.5 (3) MG/3ML SOLN Take 3 mLs by nebulization 2 (two) times daily.     Historical Provider, MD  lidocaine (LIDODERM) 5 % Place 1 patch onto the skin daily. Remove & Discard patch within 12 hours or as directed by MD 12/13/16   Leroy Sea, MD  Menthol, Topical Analgesic, (BIOFREEZE EX) Apply 1 application topically daily as needed (pain).     Historical Provider, MD  metoprolol succinate (TOPROL-XL) 25 MG 24 hr tablet Take 25 mg by mouth 2 (two) times daily.    Historical Provider, MD  Omega-3 Fatty Acids (FISH OIL PO) Take 1 g by mouth daily. Reported on 03/10/2016     Historical Provider, MD  OxyCODONE HCl, Abuse Deter, (OXAYDO) 5 MG TABA Take 5 mg by mouth every 4 (four) hours as needed. 12/13/16   Leroy Sea, MD  OXYGEN Inhale 3 L into the lungs daily as needed (SOB).     Historical Provider, MD  potassium chloride SA (K-DUR,KLOR-CON) 20 MEQ tablet Take 1 tablet (20 mEq total) by mouth daily. 12/13/16   Leroy Sea, MD  pregabalin (LYRICA) 75 MG capsule Take 75 mg by mouth 2 (two) times daily.    Historical Provider, MD  Sennosides (SENNA LAX PO) Take 2 tablets by mouth 2 (two) times daily.     Historical Provider, MD  torsemide (DEMADEX) 10 MG tablet Take 10 mg by mouth daily.    Historical Provider, MD  vitamin B-12 (CYANOCOBALAMIN) 1000 MCG tablet Take 1,000 mcg by mouth daily.    Historical Provider, MD  vitamin C (ASCORBIC ACID) 500 MG tablet Take 500 mg by mouth 2 (two) times daily. 10,22    Historical  Provider, MD  warfarin (COUMADIN) 4 MG tablet Take 3.5 mg by mouth at bedtime. Every evening     Historical Provider, MD  zinc sulfate 220 (50 Zn) MG capsule Take 220 mg by mouth daily.     Historical Provider, MD     Family History  Problem Relation Age of Onset  . Alcohol abuse Mother   . Hypertension Mother   . Coronary artery disease Mother   . Alcohol abuse Father   . Hypertension Father   . Coronary artery disease Father   . Alcohol abuse Maternal Uncle     Social History   Social History  . Marital status: Single    Spouse name: N/A  . Number of children: N/A  . Years of  education: N/A   Social History Main Topics  . Smoking status: Former Smoker    Packs/day: 2.00    Years: 38.00    Types: Cigarettes  . Smokeless tobacco: Never Used     Comment: quit spring of 2017.  Marland Kitchen Alcohol use No  . Drug use: No  . Sexual activity: Not on file   Other Topics Concern  . Not on file   Social History Narrative   Lives @ [facility] since Spring 2017.  Sedentary.  Unsteady on his feet.  Falls about 1x/wk.    Review of Systems  Unable to perform ROS: Dementia    Vital Signs: There were no vitals taken for this visit.  Physical Exam  Constitutional: He is oriented to person, place, and time. He appears well-developed.  Cardiovascular: Normal rate, regular rhythm and normal heart sounds.   Pulmonary/Chest: Effort normal and breath sounds normal. No respiratory distress.  Abdominal: Soft.  Neurological: He is alert and oriented to person, place, and time.  Skin: Skin is warm and dry.  Psychiatric:  Not alert, not communicative  Nursing note and vitals reviewed.   Mallampati Score:  MD Evaluation Airway: Other (comments) Airway comments: trach Heart: WNL Abdomen: WNL Chest/ Lungs: WNL ASA  Classification: 3 Mallampati/Airway Score: Two  Imaging: Ct Abdomen Wo Contrast  Result Date: 01/31/2017 CLINICAL DATA:  Evaluate anatomy for potential gastrostomy tube  placement. History of left pneumothorax and left chest tube placement. EXAM: CT ABDOMEN WITHOUT CONTRAST TECHNIQUE: Multidetector CT imaging of the abdomen was performed following the standard protocol without IV contrast. COMPARISON:  Abdominal CT 02/02/2016 FINDINGS: Lower chest: Left chest tube is partially visualized. Patchy interstitial densities in the dependent aspect of both lungs. There appears to be a small amount of anterior mediastinal gas. There is anterior subcutaneous gas bilaterally. Heart size appears to be enlarged. There may be minimal right pleural fluid. Hepatobiliary: Normal appearance of the liver. Gallbladder is mildly distended. Pancreas: Normal appearance of the pancreas without inflammation or duct dilatation. 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 Adrenals/Urinary Tract: Normal adrenal glands. Normal appearance of both kidneys without hydronephrosis. Stomach/Bowel: Normal configuration of the stomach. Left transverse colon is close to the gastric body region. Mid transverse colon extends into the ventral hernia. There is no significant small or large bowel dilatation. Normal appearance of the duodenum. Vascular/Lymphatic: Atherosclerotic calcifications involving the aorta and visualized iliac arteries. No evidence for an aortic aneurysm. Other: There is a large ventral hernia containing mostly fat but a small portion of the transverse colon. This ventral hernia is caudal to where an expected gastrostomy tube placement would be. There is a small indentation in the left upper abdomen which probably represents a prior gastrostomy tube site. Musculoskeletal: Old right rib fractures. Old compression deformity involving the T8 vertebral body. IMPRESSION: The anatomy is suitable for percutaneous gastrostomy tube placement. However, visualization of the left transverse colon should be considered during gastrostomy tube placement due to the close proximity of the stomach. Question previous  gastrostomy tube. Patchy disease at both lung bases with a left chest tube. No evidence for a large pneumothorax. Subcutaneous and mediastinal gas. Large ventral hernia. Electronically Signed   By: Richarda Overlie M.D.   On: 01/31/2017 10:36   Dg Chest Port 1 View  Result Date: 01/31/2017 CLINICAL DATA:  Atelectasis.  Tracheostomy present. EXAM: PORTABLE CHEST 1 VIEW COMPARISON:  January 28, 2017 FINDINGS: Tracheostomy the catheter tip is 4.3 cm above the carina. Chest catheter present. No evident pneumothorax.  There is atelectatic change in both mid and lower lung zones, stable. There is no consolidation. Heart is mildly enlarged with pulmonary vascularity within normal limits. No bone lesions. IMPRESSION: Tube positions as described without evident pneumothorax. Patchy atelectasis bilaterally without frank consolidation. Stable cardiac prominence. Electronically Signed   By: Bretta Bang III M.D.   On: 01/31/2017 07:57   Dg Chest Port 1 View  Result Date: 01/28/2017 CLINICAL DATA:  Pneumothorax. EXAM: PORTABLE CHEST 1 VIEW COMPARISON:  Chest x-rays dated 01/27/2017, 01/23/2017 and 01/22/2017. FINDINGS: Tracheostomy appears stable in position. Overall cardiomediastinal silhouette is grossly stable given slight differences in patient positioning. Improved aeration of the right lung on today's exam suggesting continued improvement in fluid status. Left lung appears stable compared to yesterday's exam, improved compared to earlier chest x-rays. No pneumothorax seen. Chest tube is stable in position at the left lung base. IMPRESSION: 1. Chest tube is stable in position at the left lung base. No pneumothorax seen. 2. Both lungs demonstrate improved aeration compared to earlier exams indicating improved fluid status. Electronically Signed   By: Bary Richard M.D.   On: 01/28/2017 09:01   Dg Chest Port 1 View  Result Date: 01/27/2017 CLINICAL DATA:  Pneumothorax, chest tube. EXAM: PORTABLE CHEST 1 VIEW COMPARISON:   Chest x-rays dated 01/23/2017 and 01/22/2017. FINDINGS: Tracheostomy tube appears grossly stable in position with tip again approximately 5 cm above the level of the carina. Cardiomediastinal silhouette appears stable. The left-sided pneumothorax described on previous exams is not seen on today's study. Chest tube again noted at the left lung base appear Interstitial edema is stable or slightly decreased compared to the previous exam. No new lung findings. IMPRESSION: 1. No left-sided pneumothorax is appreciated on today's study, apparently resolved since the previous study. Chest tube at the left lung base appears stable in position. 2. Bilateral interstitial edema, right greater than left, is stable or slightly decreased compared to the previous chest x-ray of 01/23/2017, suggesting improved fluid status. Electronically Signed   By: Bary Richard M.D.   On: 01/27/2017 12:52   Dg Chest Port 1 View  Result Date: 01/23/2017 CLINICAL DATA:  Respiratory failure with chest tube present EXAM: PORTABLE CHEST 1 VIEW COMPARISON:  January 22, 2017 FINDINGS: Tracheostomy catheter tip is 5.5 cm above the carina. Nasogastric tube tip and side port are in the stomach. There is a chest tube on the left. Left-sided pneumothorax is slightly smaller. No tension component. There is extensive subcutaneous air. There is also fairly extensive pneumomediastinum. There is underlying interstitial edema. There has been partial clearing of airspace opacity in the left mid lower lung zones compared to 1 day prior. No new opacity. Heart is mildly enlarged with pulmonary vascularity within normal limits. No adenopathy. There is an old healed fracture of the right clavicle. IMPRESSION: Left chest tube remains in place with left sided pneumothorax slightly smaller. Extensive pneumomediastinum remains. Other tube positions are unchanged. Partial but incomplete clearing of airspace consolidation the left mid and lower lung zones. There are areas of  patchy atelectasis bilaterally as well as mild interstitial edema. Stable cardiomegaly. Electronically Signed   By: Bretta Bang III M.D.   On: 01/23/2017 07:26   Dg Chest Portable 1 View  Result Date: 01/22/2017 CLINICAL DATA:  Left-sided chest tube placement.  Initial encounter. EXAM: PORTABLE CHEST 1 VIEW COMPARISON:  Chest radiograph performed 01/21/2017 FINDINGS: There has been interval placement of a left basilar chest tube, with apparent mild interval improvement in the patient's  moderate left-sided pneumothorax. However, there appears to be increased prominence of air surrounding the mediastinum. This is thought most likely to reflect worsening pneumomediastinum due to air tracking from the patient's tracheostomy tube. Worsening soft tissue air is seen along the left chest wall, and diffuse soft tissue air is again seen tracking about the neck. Patchy bilateral airspace opacity, more prominent at the left lung base, raises concern for multifocal pneumonia. No pleural effusion is seen. The cardiomediastinal silhouette is borderline enlarged. No acute osseous abnormalities are identified. IMPRESSION: 1. Interval placement of left basilar chest tube, with apparent mild interval improvement in the patient's moderate left-sided pneumothorax. 2. Increased prominence of air surrounding the mediastinum. This is thought most likely to reflect worsening pneumomediastinum due to air dissecting from the patient's tracheostomy tube. 3. Worsening soft tissue air along the left chest wall. Diffuse soft tissue air again seen tracking about the neck. 4. Patchy bilateral airspace opacity, particularly at the left lung base, raises concern for multifocal pneumonia, more prominent than on the prior studies. 5. Borderline cardiomegaly. Critical Value/emergent results were called by telephone at the time of interpretation on 01/22/2017 at 3:50 am to Dr. Ross Marcus, who verbally acknowledged these results.  Electronically Signed   By: [name] M.D.   On: 01/22/2017 03:52   Dg Chest Port 1 View  Result Date: 01/22/2017 CLINICAL DATA:  Assess tracheostomy tube position. Initial encounter. EXAM: PORTABLE CHEST 1 VIEW COMPARISON:  Chest radiograph performed 01/17/2017 FINDINGS: The tracheostomy tube is noted ending 5-6 cm above the carina. There is a large amount of new soft tissue air tracking about the neck and left chest wall. An associated moderate to large left-sided pneumothorax is seen, likely measuring at least 40-50% of left lung volume. New patchy right-sided airspace opacity may reflect interstitial edema or possibly infection. No pleural effusion is seen. The cardiomediastinal silhouette is borderline normal in size. No acute osseous abnormalities are identified. An enteric tube is noted extending below the diaphragm. IMPRESSION: 1. Moderate to large left-sided pneumothorax, likely measuring at least 40-50% of left lung volume, new from the prior study. 2. Large amount of new soft tissue air tracking about the neck and left chest wall. Given recent replacement of tracheostomy tube, the pneumothorax and soft tissue air likely reflects extension of air from the level of the tracheostomy. 3. New patchy right-sided airspace opacity may reflect interstitial edema or possibly infection. Critical Value/emergent results were called by telephone at the time of interpretation on 01/22/2017 at 12:40 am to Quenten Raven at [facility], who verbally acknowledged these results. Electronically Signed   By: [name] M.D.   On: 01/22/2017 00:44   Dg Chest Port 1 View  Result Date: 01/17/2017 CLINICAL DATA:  Respiratory failure. EXAM: PORTABLE CHEST 1 VIEW COMPARISON:  01/07/2017. FINDINGS: Interim placement of tracheostomy tube, its tip is in good anatomic position. NG tube in stable position. Cardiomegaly with pulmonary vascular prominence and bilateral interstitial prominence consistent CHF.  Similar findings noted on prior exam. Tiny bilateral pleural effusions cannot be excluded. No pneumothorax P IMPRESSION: 1. Interim placement of tracheostomy tube, its tip is in good anatomic position. NG tube noted in good anatomic position. 2. Cardiomegaly with pulmonary vascular prominence and bilateral interstitial prominence consistent with CHF. Similar findings on prior exam . Electronically Signed   By: Maisie Fus  Register   On: 01/17/2017 07:24   Dg Chest Port 1 View  Result Date: 01/07/2017 CLINICAL DATA:  Intubated.  Acute respiratory failure.  Ex-smoker. EXAM: PORTABLE CHEST 1 VIEW COMPARISON:  01/01/2017. FINDINGS: Stable mildly enlarged cardiac silhouette. Endotracheal tube in satisfactory position. Nasogastric tube extending into the stomach. Mildly decreased prominence of the interstitial markings. Increased linear density in the left lower lung zone. Unremarkable bones. IMPRESSION: 1. Improving interstitial pulmonary edema. 2. Mildly increased linear atelectasis at the left lung base. Electronically Signed   By: Beckie Salts M.D.   On: 01/07/2017 16:12   Dg Abd Portable 1v  Result Date: 01/28/2017 CLINICAL DATA:  61 year old male with a history of nasogastric tube placement EXAM: PORTABLE ABDOMEN - 1 VIEW COMPARISON:  None. FINDINGS: Limited plain film of the upper abdomen. Nasogastric tube terminates above the diaphragm to the right of the mediastinum. Pigtail drainage catheter projects over the lower left chest, unchanged. IMPRESSION: Limited plain film demonstrates nasogastric tube terminating to the right of the mediastinum above the diaphragm, likely within a bronchus/airway. These results were called by telephone at the time of interpretation on 01/28/2017 at 11:57 am to nurse caring for the patient, Ms Kenn File, who verbally acknowledged these results. They have confirmed removal before the phone call. Electronically Signed   By: Gilmer Mor D.O.   On: 01/28/2017 11:59   Dg Abd Portable  1v  Result Date: 01/26/2017 CLINICAL DATA:  NG tube placement EXAM: PORTABLE ABDOMEN - 1 VIEW COMPARISON:  01/26/2017 FINDINGS: A left lower pigtail catheter is in place with adjacent small subcutaneous emphysema. The tip of the esophageal tube is poorly visualized, due to patient motion. It is suspected to be in the region of distal esophagus. Upper gas pattern grossly unremarkable. IMPRESSION: Difficult visualization of esophageal tube due to patient motion, suspect that the tube tip overlies the distal esophagus or GE junction, consider further advancement. Electronically Signed   By: Jasmine Pang M.D.   On: 01/26/2017 22:32   Dg Abd Portable 1v  Result Date: 01/26/2017 CLINICAL DATA:  NG tube placement EXAM: PORTABLE ABDOMEN - 1 VIEW COMPARISON:  Abdominal radiograph 01/26/2017 at 4:49 p.m. FINDINGS: Basilar left-sided chest tube is in unchanged position. The nasogastric tube is been advanced slightly with tip and side port overlying the gastric body. IMPRESSION: NG tube tip and side port overlie the gastric body. Electronically Signed   By: Deatra Robinson M.D.   On: 01/26/2017 20:20   Dg Abd Portable 1v  Result Date: 01/26/2017 CLINICAL DATA:  Nasogastric tube placement EXAM: PORTABLE ABDOMEN - 1 VIEW COMPARISON:  Portable exam 1649 hours compared to 01/24/2017 FINDINGS: Pigtail drainage catheter at inferior LEFT hemithorax. Nasogastric tube extends into proximal stomach. Visualized bowel gas pattern normal. Bones demineralized. IMPRESSION: Nasogastric tube tip projects over proximal stomach. Electronically Signed   By: Ulyses Southward M.D.   On: 01/26/2017 16:58   Dg Abd Portable 1v  Result Date: 01/24/2017 CLINICAL DATA:  NG tube placement EXAM: PORTABLE ABDOMEN - 1 VIEW COMPARISON:  01/24/2017 FINDINGS: Esophageal tube tip and side-port project over the proximal stomach. Gas pattern nonobstructed. Aortic atherosclerosis IMPRESSION: Esophageal tube tip and side-port project over the proximal stomach  Electronically Signed   By: Jasmine Pang M.D.   On: 01/24/2017 18:02   Dg Abd Portable 1v  Result Date: 01/24/2017 CLINICAL DATA:  Nasogastric tube placement. EXAM: PORTABLE ABDOMEN - 1 VIEW COMPARISON:  Earlier today. FINDINGS: Normal bowel gas pattern. Nasogastric tube tip in the proximal stomach and side hole in the region of the gastroesophageal junction. Atheromatous arterial calcifications, including the abdominal aorta. Mild lumbar spine degenerative changes. Increased patchy density  at the left lung base. IMPRESSION: 1. Nasogastric tube tip in the proximal stomach and side hole in the region of the gastroesophageal junction. It is recommended that this be advanced farther into the stomach. 2. Aortic atherosclerosis. 3. Increased patchy atelectasis or pneumonia at the left lung base. Electronically Signed   By: Beckie Salts M.D.   On: 01/24/2017 13:44   Dg Abd Portable 1v  Result Date: 01/24/2017 CLINICAL DATA:  NG tube placement EXAM: PORTABLE ABDOMEN - 1 VIEW COMPARISON:  CXR from earlier on the same day. Abdomen radiographs from 03/29 8 seen. FINDINGS: Stable cardiomegaly with patchy airspace opacities in the right upper lobe, left mid lung and left lung base. Gastric tube extends below the left hemidiaphragm with tip and side-port in the region of the gastric fundus and body. Left-sided chest tube projects over the left lung base with subcutaneous emphysema noted bilaterally. Degenerative change noted of the mid thoracic spine. IMPRESSION: Gastric tube extends into the fundus and body of the stomach. Stable cardiomegaly with left-sided chest tube in place. Patchy airspace opacities in the visualized right upper lobe and left lung base. Subcutaneous emphysema is seen bilaterally. Electronically Signed   By: Tollie Eth M.D.   On: 01/24/2017 02:55   Dg Abd Portable 1v  Result Date: 01/21/2017 CLINICAL DATA:  Nasogastric tube placement.  Initial encounter. EXAM: PORTABLE ABDOMEN - 1 VIEW  COMPARISON:  Abdominal radiograph performed earlier today at 10:11 p.m. FINDINGS: The patient's enteric tube is noted ending overlying the fundus of the stomach. The visualized bowel gas pattern is grossly unremarkable. No free intra-abdominal air is seen, though evaluation for free air is limited on a single supine view. No acute osseous abnormalities are identified. IMPRESSION: Enteric tube noted ending overlying the fundus of the stomach. Electronically Signed   By: [name] M.D.   On: 01/21/2017 02:34   Dg Abd Portable 1v  Result Date: 01/19/2017 CLINICAL DATA:  Nasogastric tube placement.  Initial encounter. EXAM: PORTABLE ABDOMEN - 1 VIEW COMPARISON:  Abdominal radiograph performed earlier today at 10:11 p.m. FINDINGS: The patient's enteric tube is noted ending overlying the fundus of the stomach. The visualized bowel gas pattern is grossly unremarkable. No free intra-abdominal air is seen, though evaluation for free air is limited on a single supine view. No acute osseous abnormalities are identified. IMPRESSION: Enteric tube noted ending overlying the fundus of the stomach. Electronically Signed   By: [name] M.D.   On: 01/19/2017 23:24   Dg Abd Portable 1v  Result Date: 01/19/2017 CLINICAL DATA:  Nasogastric tube placement.  Initial encounter. EXAM: PORTABLE ABDOMEN - 1 VIEW COMPARISON:  Abdominal radiograph performed 01/07/2017 FINDINGS: The patient's enteric tube is noted ending overlying the distal esophagus. This could be advanced at least 10 cm. The visualized bowel gas pattern is grossly unremarkable. No free intra-abdominal air is seen, though evaluation for free air is limited on a single supine view. No acute osseous abnormalities are identified. IMPRESSION: Enteric tube noted ending overlying the distal esophagus. This could be advanced at least 10 cm. Electronically Signed   By: [name] M.D.   On: 01/19/2017 22:34   Dg Abd Portable 1v  Result Date:  01/07/2017 CLINICAL DATA:  Evaluate NG tube. EXAM: PORTABLE ABDOMEN - 1 VIEW COMPARISON:  January 07, 2017 FINDINGS: The NG tube terminates just below the left hemidiaphragm with both the distal tip and side ports in the stomach. IMPRESSION: NG tube placement as above. Electronically Signed   By:  Gerome Sam III M.D   On: 01/07/2017 12:28   Dg Abd Portable 1v  Result Date: 01/07/2017 CLINICAL DATA:  Nasogastric tube placement. EXAM: PORTABLE ABDOMEN - 1 VIEW COMPARISON:  01/06/2017 FINDINGS: An enteric tube is present with tip projected over the abdominal hiatus consistent with location in the EG junction. Proximal side hole is over the distal esophagus. Advancement is suggested. IMPRESSION: Enteric tube tip is projected over the lower chest consistent with location in the distal esophagus. Electronically Signed   By: Burman Nieves M.D.   On: 01/07/2017 01:54   Dg Abd Portable 1v  Result Date: 01/06/2017 CLINICAL DATA:  NG tube placement EXAM: PORTABLE ABDOMEN - 1 VIEW COMPARISON:  01/06/2017 FINDINGS: Esophageal tube tip overlies the proximal stomach. Upper gas pattern is unremarkable. IMPRESSION: Esophageal tube tip overlies proximal stomach, side-port probably in the region of GE junction, further advancement by at least 5 cm for more optimal positioning is suggested. Electronically Signed   By: Jasmine Pang M.D.   On: 01/06/2017 22:43   Dg Abd Portable 1v  Result Date: 01/06/2017 CLINICAL DATA:  Check nasogastric catheter placement EXAM: PORTABLE ABDOMEN - 1 VIEW COMPARISON:  12/24/2016 FINDINGS: Nasogastric catheter is noted with the tip in the stomach. The stomach is decompressed. The proximal side port lies within the distal esophagus. This should be advanced further into the stomach. No obstructive changes are noted. IMPRESSION: Nasogastric catheter as described. Electronically Signed   By: Alcide Clever M.D.   On: 01/06/2017 13:32    Labs:  CBC:  Recent Labs  01/16/17 0734  01/20/17 0654 01/23/17 0844 01/29/17 0444  WBC 15.2* 16.5* 13.6* 9.1  HGB 12.7* 12.5* 12.7* 13.0  HCT 41.1 39.1 40.8 41.0  PLT 178 155 182 192    COAGS:  Recent Labs  01/18/17 0726 01/19/17 0637 01/20/17 0654 01/21/17 0534  INR 1.07 1.13 1.24 1.18    BMP:  Recent Labs  01/23/17 0844 01/25/17 0818 01/26/17 0614 01/28/17 0441 01/29/17 0444  NA 148* 147*  --  152* 148*  K 3.8 3.3* 3.8 4.0 3.9  CL 105 107  --  113* 110  CO2 32 31  --  28 28  GLUCOSE 163* 138*  --  86 127*  BUN 77* 60*  --  43* 37*  CALCIUM 11.1* 10.1  --  9.6 9.2  CREATININE 2.93* 2.41*  --  2.17* 2.15*  GFRNONAA 22* 27*  --  31* 31*  GFRAA 25* 32*  --  36* 36*    LIVER FUNCTION TESTS:  Recent Labs  12/08/16 0738 12/11/16 0257 12/14/16 0658 01/16/17 0734  BILITOT 0.6 0.6 0.6 0.5  AST 21 26 47* 34  ALT 17 19 42 23  ALKPHOS 84 84 149* 131*  PROT 6.3* 6.7 5.9* 7.3  ALBUMIN 1.9* 1.7* 1.4* 2.1*    TUMOR MARKERS: No results for input(s): AFPTM, CEA, CA199, CHROMGRNA in the last 8760 hours.  Assessment and Plan: Patient with past medical history of dementia, stroke, and respiratory failure s/p trach placement now with complaint of dysphagia.  IR consulted for percutaneous gastrostomy tube placement. He is NPO without NGT in place.  Will hold blood thinners overnight. Risks and Benefits discussed with the patient including, but not limited to the need for a barium enema during the procedure, bleeding, infection, peritonitis, or damage to adjacent structures. All of the patient's questions were answered, patient is agreeable to proceed. Consent signed and in chart.  Thank you for this interesting consult.  I greatly enjoyed meeting Curtis Keller and look forward to participating in their care.  A copy of this report was sent to the requesting provider on this date.  Electronically Signed: Hoyt Koch 01/31/2017, 2:51 PM   I spent a total of 40 Minutes    in face to face in  clinical consultation, greater than 50% of which was counseling/coordinating care for dysphagia.

## 2017-02-01 ENCOUNTER — Other Ambulatory Visit (HOSPITAL_COMMUNITY): Payer: Self-pay

## 2017-02-01 LAB — BASIC METABOLIC PANEL
Anion gap: 9 (ref 5–15)
BUN: 18 mg/dL (ref 6–20)
CALCIUM: 8.7 mg/dL — AB (ref 8.9–10.3)
CHLORIDE: 106 mmol/L (ref 101–111)
CO2: 23 mmol/L (ref 22–32)
CREATININE: 1.58 mg/dL — AB (ref 0.61–1.24)
GFR calc non Af Amer: 46 mL/min — ABNORMAL LOW (ref >60)
GFR, EST AFRICAN AMERICAN: 53 mL/min — AB (ref >60)
GLUCOSE: 126 mg/dL — AB (ref 65–99)
Potassium: 3.2 mmol/L — ABNORMAL LOW (ref 3.5–5.1)
Sodium: 138 mmol/L (ref 135–145)

## 2017-02-01 LAB — CULTURE, RESPIRATORY W GRAM STAIN

## 2017-02-01 LAB — PROTIME-INR
INR: 1.08
Prothrombin Time: 14.1 s (ref 11.4–15.2)

## 2017-02-02 ENCOUNTER — Encounter (HOSPITAL_COMMUNITY): Payer: Self-pay | Admitting: Interventional Radiology

## 2017-02-02 ENCOUNTER — Other Ambulatory Visit (HOSPITAL_COMMUNITY): Payer: Self-pay

## 2017-02-02 DIAGNOSIS — R1310 Dysphagia, unspecified: Secondary | ICD-10-CM | POA: Diagnosis not present

## 2017-02-02 HISTORY — PX: IR GASTROSTOMY TUBE MOD SED: IMG625

## 2017-02-02 LAB — BASIC METABOLIC PANEL
ANION GAP: 6 (ref 5–15)
BUN: 14 mg/dL (ref 6–20)
CALCIUM: 8.6 mg/dL — AB (ref 8.9–10.3)
CHLORIDE: 107 mmol/L (ref 101–111)
CO2: 25 mmol/L (ref 22–32)
Creatinine, Ser: 1.45 mg/dL — ABNORMAL HIGH (ref 0.61–1.24)
GFR calc non Af Amer: 51 mL/min — ABNORMAL LOW (ref >60)
GFR, EST AFRICAN AMERICAN: 59 mL/min — AB (ref >60)
Glucose, Bld: 112 mg/dL — ABNORMAL HIGH (ref 65–99)
POTASSIUM: 4 mmol/L (ref 3.5–5.1)
Sodium: 138 mmol/L (ref 135–145)

## 2017-02-02 LAB — MAGNESIUM: Magnesium: 1.6 mg/dL — ABNORMAL LOW (ref 1.7–2.4)

## 2017-02-02 MED ORDER — FENTANYL CITRATE (PF) 100 MCG/2ML IJ SOLN
INTRAMUSCULAR | Status: AC | PRN
  Administered 2017-02-02: 25 ug via INTRAVENOUS

## 2017-02-02 MED ORDER — IOPAMIDOL (ISOVUE-300) INJECTION 61%
INTRAVENOUS | Status: AC
  Administered 2017-02-02: 50 mL
  Filled 2017-02-02: qty 50

## 2017-02-02 MED ORDER — GLUCAGON HCL RDNA (DIAGNOSTIC) 1 MG IJ SOLR
INTRAMUSCULAR | Status: AC
  Filled 2017-02-02: qty 1

## 2017-02-02 MED ORDER — LIDOCAINE HCL 1 % IJ SOLN
INTRAMUSCULAR | Status: AC
  Filled 2017-02-02: qty 20

## 2017-02-02 MED ORDER — LIDOCAINE HCL 1 % IJ SOLN
INTRAMUSCULAR | Status: AC | PRN
  Administered 2017-02-02: 8 mL

## 2017-02-02 MED ORDER — FENTANYL CITRATE (PF) 100 MCG/2ML IJ SOLN
INTRAMUSCULAR | Status: AC
  Filled 2017-02-02: qty 2

## 2017-02-02 NOTE — Procedures (Signed)
20F gastrostomy tube placement under fluoro No complication No blood loss. See complete dictation in [facility] PACS.  

## 2017-02-04 LAB — BASIC METABOLIC PANEL
Anion gap: 8 (ref 5–15)
BUN: 22 mg/dL — AB (ref 6–20)
CHLORIDE: 103 mmol/L (ref 101–111)
CO2: 27 mmol/L (ref 22–32)
CREATININE: 1.57 mg/dL — AB (ref 0.61–1.24)
Calcium: 9.3 mg/dL (ref 8.9–10.3)
GFR calc Af Amer: 53 mL/min — ABNORMAL LOW (ref >60)
GFR calc non Af Amer: 46 mL/min — ABNORMAL LOW (ref >60)
Glucose, Bld: 105 mg/dL — ABNORMAL HIGH (ref 65–99)
Potassium: 4.6 mmol/L (ref 3.5–5.1)
Sodium: 138 mmol/L (ref 135–145)

## 2017-02-04 LAB — CBC
HEMATOCRIT: 39.3 % (ref 39.0–52.0)
HEMOGLOBIN: 12.6 g/dL — AB (ref 13.0–17.0)
MCH: 32.6 pg (ref 26.0–34.0)
MCHC: 32.1 g/dL (ref 30.0–36.0)
MCV: 101.6 fL — AB (ref 78.0–100.0)
Platelets: 197 10*3/uL (ref 150–400)
RBC: 3.87 MIL/uL — ABNORMAL LOW (ref 4.22–5.81)
RDW: 16.5 % — ABNORMAL HIGH (ref 11.5–15.5)
WBC: 9.4 10*3/uL (ref 4.0–10.5)

## 2017-02-06 ENCOUNTER — Other Ambulatory Visit (HOSPITAL_COMMUNITY): Payer: Self-pay

## 2017-02-06 DIAGNOSIS — I517 Cardiomegaly: Secondary | ICD-10-CM | POA: Diagnosis not present

## 2017-02-06 LAB — BASIC METABOLIC PANEL
ANION GAP: 7 (ref 5–15)
BUN: 38 mg/dL — AB (ref 6–20)
CHLORIDE: 106 mmol/L (ref 101–111)
CO2: 24 mmol/L (ref 22–32)
Calcium: 9.2 mg/dL (ref 8.9–10.3)
Creatinine, Ser: 1.46 mg/dL — ABNORMAL HIGH (ref 0.61–1.24)
GFR calc non Af Amer: 50 mL/min — ABNORMAL LOW (ref >60)
GFR, EST AFRICAN AMERICAN: 58 mL/min — AB (ref >60)
Glucose, Bld: 83 mg/dL (ref 65–99)
Potassium: 4.8 mmol/L (ref 3.5–5.1)
SODIUM: 137 mmol/L (ref 135–145)

## 2017-02-06 LAB — CBC
HCT: 37.4 % — ABNORMAL LOW (ref 39.0–52.0)
Hemoglobin: 12.3 g/dL — ABNORMAL LOW (ref 13.0–17.0)
MCH: 32.9 pg (ref 26.0–34.0)
MCHC: 32.9 g/dL (ref 30.0–36.0)
MCV: 100 fL (ref 78.0–100.0)
PLATELETS: 205 10*3/uL (ref 150–400)
RBC: 3.74 MIL/uL — AB (ref 4.22–5.81)
RDW: 16.1 % — AB (ref 11.5–15.5)
WBC: 10.5 10*3/uL (ref 4.0–10.5)

## 2017-02-07 ENCOUNTER — Other Ambulatory Visit (HOSPITAL_COMMUNITY): Payer: Self-pay

## 2017-02-07 DIAGNOSIS — S72002A Fracture of unspecified part of neck of left femur, initial encounter for closed fracture: Secondary | ICD-10-CM | POA: Diagnosis not present

## 2017-02-08 ENCOUNTER — Other Ambulatory Visit (HOSPITAL_COMMUNITY): Payer: Self-pay

## 2017-02-08 DIAGNOSIS — J9690 Respiratory failure, unspecified, unspecified whether with hypoxia or hypercapnia: Secondary | ICD-10-CM | POA: Diagnosis not present

## 2017-02-08 NOTE — Progress Notes (Signed)
Patient ID: Curtis Keller, male   DOB: 11/09/1955, 60 y.o.   MRN: [id number] Patient is a 60-year-old gentleman multiple medical problems with a left femoral neck fracture. Repeat radiographs shows an increased displacement of the femoral neck fracture. Patient metabolic status is much improved at this time he is alert and oriented this morning. We will plan for left hip hemiarthroplasty on Friday. 

## 2017-02-09 ENCOUNTER — Other Ambulatory Visit (INDEPENDENT_AMBULATORY_CARE_PROVIDER_SITE_OTHER): Payer: Self-pay | Admitting: Family

## 2017-02-10 ENCOUNTER — Encounter: Payer: Self-pay | Admitting: Anesthesiology

## 2017-02-10 ENCOUNTER — Encounter
Disposition: A | Discharge status: Skilled Nursing Facility | Payer: Self-pay | Source: Ambulatory Visit | Attending: Internal Medicine

## 2017-02-10 ENCOUNTER — Institutional Professional Consult (permissible substitution) (HOSPITAL_COMMUNITY): Payer: Self-pay | Admitting: Anesthesiology

## 2017-02-10 DIAGNOSIS — S72002K Fracture of unspecified part of neck of left femur, subsequent encounter for closed fracture with nonunion: Secondary | ICD-10-CM | POA: Diagnosis not present

## 2017-02-10 HISTORY — PX: HIP ARTHROPLASTY: SHX981

## 2017-02-10 SURGERY — HEMIARTHROPLASTY, HIP, DIRECT ANTERIOR APPROACH, FOR FRACTURE
Anesthesia: General | Site: Hip | Laterality: Left

## 2017-02-10 MED ORDER — PROPOFOL 10 MG/ML IV BOLUS
INTRAVENOUS | Status: DC | PRN
  Administered 2017-02-10: 110 mg via INTRAVENOUS

## 2017-02-10 MED ORDER — SODIUM CHLORIDE 0.9 % IR SOLN
Status: DC | PRN
  Administered 2017-02-10: 1000 mL

## 2017-02-10 MED ORDER — PHENYLEPHRINE 40 MCG/ML (10ML) SYRINGE FOR IV PUSH (FOR BLOOD PRESSURE SUPPORT)
PREFILLED_SYRINGE | INTRAVENOUS | Status: AC
  Filled 2017-02-10: qty 20

## 2017-02-10 MED ORDER — PHENYLEPHRINE 40 MCG/ML (10ML) SYRINGE FOR IV PUSH (FOR BLOOD PRESSURE SUPPORT)
PREFILLED_SYRINGE | INTRAVENOUS | Status: DC | PRN
  Administered 2017-02-10: 120 ug via INTRAVENOUS
  Administered 2017-02-10: 80 ug via INTRAVENOUS
  Administered 2017-02-10: 120 ug via INTRAVENOUS

## 2017-02-10 MED ORDER — MIDAZOLAM HCL 2 MG/2ML IJ SOLN
INTRAMUSCULAR | Status: AC
  Filled 2017-02-10: qty 2

## 2017-02-10 MED ORDER — ONDANSETRON HCL 4 MG/2ML IJ SOLN
INTRAMUSCULAR | Status: AC
  Filled 2017-02-10: qty 2

## 2017-02-10 MED ORDER — LIDOCAINE 2% (20 MG/ML) 5 ML SYRINGE
INTRAMUSCULAR | Status: AC
  Filled 2017-02-10: qty 5

## 2017-02-10 MED ORDER — CEFAZOLIN SODIUM-DEXTROSE 2-4 GM/100ML-% IV SOLN
2.0000 g | INTRAVENOUS | Status: AC
  Administered 2017-02-10: 2 g via INTRAVENOUS
  Filled 2017-02-10: qty 100

## 2017-02-10 MED ORDER — CHLORHEXIDINE GLUCONATE 4 % EX LIQD: 60.0000 mL | Freq: Once | CUTANEOUS | Status: DC

## 2017-02-10 MED ORDER — SUCCINYLCHOLINE CHLORIDE 200 MG/10ML IV SOSY
PREFILLED_SYRINGE | INTRAVENOUS | Status: AC
  Filled 2017-02-10: qty 10

## 2017-02-10 MED ORDER — FENTANYL CITRATE (PF) 250 MCG/5ML IJ SOLN
INTRAMUSCULAR | Status: AC
  Filled 2017-02-10: qty 5

## 2017-02-10 MED ORDER — LACTATED RINGERS IV SOLN: INTRAVENOUS | Status: DC

## 2017-02-10 MED ORDER — LACTATED RINGERS IV SOLN
INTRAVENOUS | Status: DC
  Administered 2017-02-10: 14:00:00 via INTRAVENOUS

## 2017-02-10 MED ORDER — HYDROMORPHONE HCL 1 MG/ML IJ SOLN: 0.2500 mg | INTRAMUSCULAR | Status: DC | PRN

## 2017-02-10 MED ORDER — FENTANYL CITRATE (PF) 100 MCG/2ML IJ SOLN
INTRAMUSCULAR | Status: DC | PRN
  Administered 2017-02-10 (×2): 100 ug via INTRAVENOUS

## 2017-02-10 MED ORDER — ROCURONIUM BROMIDE 100 MG/10ML IV SOLN
INTRAVENOUS | Status: DC | PRN
  Administered 2017-02-10: 40 mg via INTRAVENOUS

## 2017-02-10 MED ORDER — PHENYLEPHRINE 40 MCG/ML (10ML) SYRINGE FOR IV PUSH (FOR BLOOD PRESSURE SUPPORT)
PREFILLED_SYRINGE | INTRAVENOUS | Status: DC | PRN
Start: 2017-02-10 — End: 2017-02-10

## 2017-02-10 MED ORDER — EPHEDRINE SULFATE-NACL 50-0.9 MG/10ML-% IV SOSY
PREFILLED_SYRINGE | INTRAVENOUS | Status: DC | PRN
  Administered 2017-02-10: 10 mg via INTRAVENOUS

## 2017-02-10 MED ORDER — SUGAMMADEX SODIUM 200 MG/2ML IV SOLN
INTRAVENOUS | Status: AC
  Filled 2017-02-10: qty 2

## 2017-02-10 MED ORDER — PROPOFOL 10 MG/ML IV BOLUS
INTRAVENOUS | Status: AC
  Filled 2017-02-10: qty 20

## 2017-02-10 MED ORDER — ONDANSETRON HCL 4 MG/2ML IJ SOLN
INTRAMUSCULAR | Status: DC | PRN
  Administered 2017-02-10: 4 mg via INTRAVENOUS

## 2017-02-10 MED ORDER — MIDAZOLAM HCL 5 MG/5ML IJ SOLN
INTRAMUSCULAR | Status: DC | PRN
  Administered 2017-02-10: 2 mg via INTRAVENOUS

## 2017-02-10 MED ORDER — SUGAMMADEX SODIUM 200 MG/2ML IV SOLN
INTRAVENOUS | Status: DC | PRN
  Administered 2017-02-10: 200 mg via INTRAVENOUS

## 2017-02-10 MED ORDER — PROMETHAZINE HCL 25 MG/ML IJ SOLN: 6.2500 mg | INTRAMUSCULAR | Status: DC | PRN

## 2017-02-10 MED ORDER — LIDOCAINE HCL (CARDIAC) 20 MG/ML IV SOLN
INTRAVENOUS | Status: DC | PRN
  Administered 2017-02-10: 60 mg via INTRAVENOUS

## 2017-02-10 MED ORDER — PHENYLEPHRINE HCL 10 MG/ML IJ SOLN
INTRAVENOUS | Status: DC | PRN
  Administered 2017-02-10: 40 ug/min via INTRAVENOUS

## 2017-02-10 SURGICAL SUPPLY — 42 items
BLADE SAW SAG 73X25 THK (BLADE) ×2
BLADE SAW SGTL 73X25 THK (BLADE) ×1 IMPLANT
BRUSH FEMORAL CANAL (MISCELLANEOUS) IMPLANT
CAPT HIP HEMI 1 ×3 IMPLANT
COVER SURGICAL LIGHT HANDLE (MISCELLANEOUS) ×3 IMPLANT
DRAPE IMP U-DRAPE 54X76 (DRAPES) ×3 IMPLANT
DRAPE INCISE IOBAN 85X60 (DRAPES) ×6 IMPLANT
DRAPE ORTHO SPLIT 77X108 STRL (DRAPES) ×4
DRAPE SURG ORHT 6 SPLT 77X108 (DRAPES) ×2 IMPLANT
DRAPE U-SHAPE 47X51 STRL (DRAPES) ×3 IMPLANT
DRSG MEPILEX BORDER 4X12 (GAUZE/BANDAGES/DRESSINGS) ×3 IMPLANT
DRSG MEPILEX BORDER 4X8 (GAUZE/BANDAGES/DRESSINGS) ×6 IMPLANT
DURAPREP 26ML APPLICATOR (WOUND CARE) ×3 IMPLANT
ELECT BLADE 6.5 EXT (BLADE) ×3 IMPLANT
ELECT CAUTERY BLADE 6.4 (BLADE) ×3 IMPLANT
ELECT REM PT RETURN 9FT ADLT (ELECTROSURGICAL) ×3
ELECTRODE REM PT RTRN 9FT ADLT (ELECTROSURGICAL) ×1 IMPLANT
GAUZE SPONGE 4X4 12PLY STRL (GAUZE/BANDAGES/DRESSINGS) ×3 IMPLANT
GLOVE BIOGEL PI IND STRL 9 (GLOVE) ×1 IMPLANT
GLOVE BIOGEL PI INDICATOR 9 (GLOVE) ×2
GLOVE SURG ORTHO 9.0 STRL STRW (GLOVE) ×3 IMPLANT
GOWN STRL REUS W/ TWL XL LVL3 (GOWN DISPOSABLE) ×1 IMPLANT
GOWN STRL REUS W/TWL XL LVL3 (GOWN DISPOSABLE) ×2
HANDPIECE INTERPULSE COAX TIP (DISPOSABLE)
KIT BASIN OR (CUSTOM PROCEDURE TRAY) ×3 IMPLANT
KIT ROOM TURNOVER OR (KITS) ×3 IMPLANT
MANIFOLD NEPTUNE II (INSTRUMENTS) ×3 IMPLANT
NS IRRIG 1000ML POUR BTL (IV SOLUTION) ×3 IMPLANT
PACK TOTAL JOINT (CUSTOM PROCEDURE TRAY) ×3 IMPLANT
PACK UNIVERSAL I (CUSTOM PROCEDURE TRAY) ×3 IMPLANT
PAD ARMBOARD 7.5X6 YLW CONV (MISCELLANEOUS) ×6 IMPLANT
SET HNDPC FAN SPRY TIP SCT (DISPOSABLE) IMPLANT
STAPLER VISISTAT 35W (STAPLE) ×3 IMPLANT
SUT ETHIBOND NAB CT1 #1 30IN (SUTURE) ×3 IMPLANT
SUT VIC AB 1 CT1 27 (SUTURE) ×2
SUT VIC AB 1 CT1 27XBRD ANBCTR (SUTURE) ×1 IMPLANT
SUT VIC AB 2-0 CTB1 (SUTURE) ×3 IMPLANT
TOWEL OR 17X24 6PK STRL BLUE (TOWEL DISPOSABLE) ×3 IMPLANT
TOWEL OR 17X26 10 PK STRL BLUE (TOWEL DISPOSABLE) ×3 IMPLANT
TOWER CARTRIDGE SMART MIX (DISPOSABLE) IMPLANT
TRAY FOLEY W/METER SILVER 16FR (SET/KITS/TRAYS/PACK) IMPLANT
WATER STERILE IRR 1000ML POUR (IV SOLUTION) ×9 IMPLANT

## 2017-02-10 NOTE — Interval H&P Note (Signed)
History and Physical Interval Note:  02/10/2017 11:52 AM  Curtis Keller  has presented today for surgery, with the diagnosis of Left Femoral Neck Fracture  The various methods of treatment have been discussed with the patient and family. After consideration of risks, benefits and other options for treatment, the patient has consented to  Procedure(s): LEFT HIP HEMIARTHROPLASTY (Left) as a surgical intervention .  The patient's history has been reviewed, patient examined, no change in status, stable for surgery.  I have reviewed the patient's chart and labs.  Questions were answered to the patient's satisfaction.     [name]

## 2017-02-10 NOTE — Anesthesia Postprocedure Evaluation (Signed)
Anesthesia Post Note  Patient: Curtis Keller  Procedure(s) Performed: Procedure(s) (LRB): LEFT HIP HEMIARTHROPLASTY (Left)  Patient location during evaluation: PACU Anesthesia Type: General Level of consciousness: awake and alert Pain management: pain level controlled Vital Signs Assessment: post-procedure vital signs reviewed and stable Respiratory status: spontaneous breathing, nonlabored ventilation, respiratory function stable and patient connected to tracheostomy mask oxygen Cardiovascular status: blood pressure returned to baseline and stable Postop Assessment: no signs of nausea or vomiting Anesthetic complications: no       Last Vitals:  Vitals:   02/10/17 1615 02/10/17 1630  BP: 101/68 107/73  Pulse: 86 80  Resp: 17 17  Temp: 36.4 C     Last Pain: There were no vitals filed for this visit.               Jarold Macomber,W. EDMOND

## 2017-02-10 NOTE — Anesthesia Preprocedure Evaluation (Addendum)
Anesthesia Evaluation  Patient identified by MRN, date of birth, ID band Patient awake    Reviewed: Unable to perform ROS - Chart review only  Airway Mallampati: Trach       Dental no notable dental hx.    Pulmonary COPD, former smoker,  resp failure c trach    + decreased breath sounds      Cardiovascular hypertension, + CAD and +CHF   Rhythm:Regular Rate:Normal     Neuro/Psych    GI/Hepatic GERD  ,  Endo/Other  diabetes  Renal/GU      Musculoskeletal   Abdominal   Peds  Hematology  (+) anemia ,   Anesthesia Other Findings   Reproductive/Obstetrics                            Lab Results  Component Value Date   WBC 10.5 02/06/2017   HGB 12.3 (L) 02/06/2017   HCT 37.4 (L) 02/06/2017   MCV 100.0 02/06/2017   PLT 205 02/06/2017   Lab Results  Component Value Date   CREATININE 1.46 (H) 02/06/2017   BUN 38 (H) 02/06/2017   NA 137 02/06/2017   K 4.8 02/06/2017   CL 106 02/06/2017   CO2 24 02/06/2017   Lab Results  Component Value Date   INR 1.08 02/01/2017   INR 1.18 01/21/2017   INR 1.24 01/20/2017   PROTIME 25.4 (A) 07/28/2016   PROTIME 28.5 (A) 07/13/2016   PROTIME 27.8 (A) 07/12/2016   EKG: normal sinus rhythm, LBBB.  Echo: - Left ventricle: The cavity size was normal. Systolic function was   severely reduced. The estimated ejection fraction was in the   range of 25% to 30%. Wall motion was normal; there were no   regional wall motion abnormalities. Doppler parameters are   consistent with abnormal left ventricular relaxation (grade 1   diastolic dysfunction). There was no evidence of elevated   ventricular filling pressure by Doppler parameters. - Mitral valve: There was trivial regurgitation. - Left atrium: The atrium was mildly dilated. - Right ventricle: Systolic function was normal. - Pulmonic valve: Not visualized. There was no regurgitation. - Inferior vena  cava: The vessel was normal in size.   Anesthesia Physical Anesthesia Plan  ASA: IV  Anesthesia Plan: General   Post-op Pain Management:    Induction: Inhalational  Airway Management Planned: Tracheostomy  Additional Equipment:   Intra-op Plan:   Post-operative Plan: Possible Post-op intubation/ventilation  Informed Consent: I have reviewed the patients History and Physical, chart, labs and discussed the procedure including the risks, benefits and alternatives for the proposed anesthesia with the patient or authorized representative who has indicated his/her understanding and acceptance.     Plan Discussed with:   Anesthesia Plan Comments:         Anesthesia Quick Evaluation

## 2017-02-10 NOTE — Transfer of Care (Signed)
Immediate Anesthesia Transfer of Care Note  Patient: Curtis Keller  Procedure(s) Performed: Procedure(s): LEFT HIP HEMIARTHROPLASTY (Left)  Patient Location: PACU  Anesthesia Type:General  Level of Consciousness: sedated  Airway & Oxygen Therapy: Patient Spontanous Breathing and Patient connected to tracheostomy mask oxygen  Post-op Assessment: Report given to RN, Post -op Vital signs reviewed and stable and Patient moving all extremities  Post vital signs: Reviewed and stable  Last Vitals:  Vitals:   02/02/17 0930 02/02/17 0936  BP: (!) 131/101 114/90  Pulse: (!) 110 (!) 108  Resp: 19 20    Last Pain: There were no vitals filed for this visit.       Complications: No apparent anesthesia complications

## 2017-02-10 NOTE — H&P (View-Only) (Signed)
Patient ID: Curtis Keller, male   DOB: [birth year], 61 y.o.   MRN: [id number] Patient is a 61 year old gentleman multiple medical problems with a left femoral neck fracture. Repeat radiographs shows an increased displacement of the femoral neck fracture. Patient metabolic status is much improved at this time he is alert and oriented this morning. We will plan for left hip hemiarthroplasty on Friday.

## 2017-02-10 NOTE — Op Note (Signed)
Date of Surgery: 02/10/2017  INDICATIONS: Curtis Keller is a 61 y.o.-year-old male who sustained a left femoral neck fracture several months ago. Patient was unstable for proceeding with surgical intervention until this time due to multiple medical problems.Marland Kitchen  PREOPERATIVE DIAGNOSIS: Left femoral neck fracture.  POSTOPERATIVE DIAGNOSIS: Same.  PROCEDURE: Hemiarthroplasty femoral neck fracture   SURGEON: Lajoyce Corners, M.D.  ANESTHESIA:  general  IV FLUIDS AND URINE: See anesthesia.  ESTIMATED BLOOD LOSS: Minimal mL.  COMPLICATIONS: None.  COMPONENT SIZES: [place] size 6 press-fit [place] basic modular head 53 mm in diameter size 0 neck  DESCRIPTION OF PROCEDURE: The patient was brought to the operating room and underwent a general anesthetic. After adequate levels of anesthesia were obtained patient was placed in the lateral decubitus position with the hip fracture side up and axillary roll was placed the arms were padded and the mark II supports were placed. The lower extremity was prepped using DuraPrep draped into a sterile field Ioban was used to cover all exposed skin. A timeout was called. A posterior lateral incision was made this was carried down through the tensor fascia lata which was split. Retractors were placed. The piriformis short external rotators and capsule was incised off the femoral neck and retracted with Ethibond suture. The femoral neck cut was made 1 cm proximal to the calcar. The head was delivered. The head was sized and the appropriate size had a good suction fit. The femoral canal was sequentially broached to the proper size. The hip was trialed and this had a good stable fit. The wound was irrigated with normal saline. The femoral component and head were inserted this was again reduced. The hip was stable. The wound was irrigated with normal saline. The capsule short external rotators and piriformis were reapproximated with #1 Ethibond. The tensor fascia lata was closed using #1  Vicryl. Subcutaneous was closed using 0 Vicryl the skin was closed using staples. A Mepilex dressing was applied. Patient was extubated taken to the PACU in stable condition. Aldean Baker, MD [facility] 3:55 PM

## 2017-02-11 LAB — CBC
HEMATOCRIT: 32.9 % — AB (ref 39.0–52.0)
HEMOGLOBIN: 10.5 g/dL — AB (ref 13.0–17.0)
MCH: 32 pg (ref 26.0–34.0)
MCHC: 31.9 g/dL (ref 30.0–36.0)
MCV: 100.3 fL — ABNORMAL HIGH (ref 78.0–100.0)
Platelets: 282 10*3/uL (ref 150–400)
RBC: 3.28 MIL/uL — ABNORMAL LOW (ref 4.22–5.81)
RDW: 15.4 % (ref 11.5–15.5)
WBC: 11.3 10*3/uL — ABNORMAL HIGH (ref 4.0–10.5)

## 2017-02-11 LAB — PROTIME-INR
INR: 1.04
Prothrombin Time: 13.6 seconds (ref 11.4–15.2)

## 2017-02-11 LAB — BASIC METABOLIC PANEL
ANION GAP: 10 (ref 5–15)
BUN: 38 mg/dL — ABNORMAL HIGH (ref 6–20)
CO2: 29 mmol/L (ref 22–32)
Calcium: 9.2 mg/dL (ref 8.9–10.3)
Chloride: 97 mmol/L — ABNORMAL LOW (ref 101–111)
Creatinine, Ser: 1.82 mg/dL — ABNORMAL HIGH (ref 0.61–1.24)
GFR calc Af Amer: 45 mL/min — ABNORMAL LOW (ref >60)
GFR, EST NON AFRICAN AMERICAN: 38 mL/min — AB (ref >60)
Glucose, Bld: 128 mg/dL — ABNORMAL HIGH (ref 65–99)
POTASSIUM: 4.2 mmol/L (ref 3.5–5.1)
Sodium: 136 mmol/L (ref 135–145)

## 2017-02-11 NOTE — Progress Notes (Signed)
PATIENT ID: Curtis Keller        MRN:  [id number]          DOB/AGE: [birth year] / 62 y.o.   Norlene Campbell, MD   Jacqualine Code, PA-C [address] [place], [place]  [address]                             [phone]   PROGRESS NOTE  Subjective: Unable to speak    Objective: Vital signs in last 24 hours:   Patient Vitals for the past 24 hrs:  BP Temp Pulse Resp SpO2 Weight  02/10/17 1630 107/73 - 80 17 90 % -  02/10/17 1615 101/68 97.5 F (36.4 C) 86 17 (!) 89 % -  02/10/17 1419 - - - - - 256 lb (116.1 kg)      Intake/Output from previous day:   04/20 0701 - 04/21 0700 In: 550 [I.V.:550] Out: 75    Intake/Output this shift:   No intake/output data recorded.   Intake/Output      04/20 0701 - 04/21 0700 04/21 0701 - 04/22 0700   I.V. (mL/kg) 550 (4.7)    Total Intake(mL/kg) 550 (4.7)    Blood 75    Total Output 75     Net +475             LABORATORY DATA:  Recent Labs  02/06/17 0628 02/11/17 0924  WBC 10.5 11.3*  HGB 12.3* 10.5*  HCT 37.4* 32.9*  PLT 205 282    Recent Labs  02/06/17 0628 02/11/17 0924  NA 137 136  K 4.8 4.2  CL 106 97*  CO2 24 29  BUN 38* 38*  CREATININE 1.46* 1.82*  GLUCOSE 83 128*  CALCIUM 9.2 9.2   Lab Results  Component Value Date   INR 1.04 02/11/2017   INR 1.08 02/01/2017   INR 1.18 01/21/2017   PROTIME 25.4 (A) 07/28/2016   PROTIME 28.5 (A) 07/13/2016   PROTIME 27.8 (A) 07/12/2016    Recent Radiographic Studies :  Ct Abdomen Wo Contrast  Result Date: 01/31/2017 CLINICAL DATA:  Evaluate anatomy for potential gastrostomy tube placement. History of left pneumothorax and left chest tube placement. EXAM: CT ABDOMEN WITHOUT CONTRAST TECHNIQUE: Multidetector CT imaging of the abdomen was performed following the standard protocol without IV contrast. COMPARISON:  Abdominal CT 02/02/2016 FINDINGS: Lower chest: Left chest tube is partially visualized. Patchy interstitial densities in the dependent aspect of both lungs. There  appears to be a small amount of anterior mediastinal gas. There is anterior subcutaneous gas bilaterally. Heart size appears to be enlarged. There may be minimal right pleural fluid. Hepatobiliary: Normal appearance of the liver. Gallbladder is mildly distended. Pancreas: Normal appearance of the pancreas without inflammation or duct dilatation. 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 Adrenals/Urinary Tract: Normal adrenal glands. Normal appearance of both kidneys without hydronephrosis. Stomach/Bowel: Normal configuration of the stomach. Left transverse colon is close to the gastric body region. Mid transverse colon extends into the ventral hernia. There is no significant small or large bowel dilatation. Normal appearance of the duodenum. Vascular/Lymphatic: Atherosclerotic calcifications involving the aorta and visualized iliac arteries. No evidence for an aortic aneurysm. Other: There is a large ventral hernia containing mostly fat but a small portion of the transverse colon. This ventral hernia is caudal to where an expected gastrostomy tube placement would be. There is a small indentation in the left upper abdomen which probably represents a prior gastrostomy  tube site. Musculoskeletal: Old right rib fractures. Old compression deformity involving the T8 vertebral body. IMPRESSION: The anatomy is suitable for percutaneous gastrostomy tube placement. However, visualization of the left transverse colon should be considered during gastrostomy tube placement due to the close proximity of the stomach. Question previous gastrostomy tube. Patchy disease at both lung bases with a left chest tube. No evidence for a large pneumothorax. Subcutaneous and mediastinal gas. Large ventral hernia. Electronically Signed   By: Richarda Overlie M.D.   On: 01/31/2017 10:36   Ir Gastrostomy Tube Mod Sed  Result Date: 02/02/2017 CLINICAL DATA:  CAD, stroke, CHF, dementia, and respiratory failure s/p trach placement who now presents with  dysphagia. Patient in need of long-term alternative means of nutrition support. EXAM: PERC PLACEMENT GASTROSTOMY FLUOROSCOPY TIME:  1mG y, 2 minutes TECHNIQUE: The procedure, risks, benefits, and alternatives were explained to the patient. Questions regarding the procedure were encouraged and answered. The patient understands and consents to the procedure. A safe percutaneous approach to the stomach was confirmed on recent CT imaging. As antibiotic prophylaxis, cefazolin 2 g was ordered pre-procedure and administered intravenously within one hour of incision. A 5 French angiographic catheter was placed as orogastric tube. The upper abdomen was prepped with Betadine, draped in usual sterile fashion, and infiltrated locally with 1% lidocaine. Intravenous Fentanyl and Versed were administered as conscious sedation during continuous monitoring of the patient's level of consciousness and physiological / cardiorespiratory status by the radiology RN, with a total moderate sedation time of 10 minutes. Stomach was insufflated using air through the orogastric tube. An 48 French sheath needle was advanced percutaneously into the gastric lumen under fluoroscopy. Gas could be aspirated and a small contrast injection confirmed intraluminal spread. The sheath was exchanged over a guidewire for a 9 [place] vascular sheath, through which the snare device was advanced and used to snare a guidewire passed through the orogastric tube. This was withdrawn, and the snare attached to the 20 French pull-through gastrostomy tube, which was advanced antegrade, positioned with the internal bumper securing the anterior gastric wall to the anterior abdominal wall. Small contrast injection confirms appropriate positioning. The external bumper was applied and the catheter was flushed. COMPLICATIONS: COMPLICATIONS none IMPRESSION: 1. Technically successful 20 French pull-through gastrostomy placement under fluoroscopy. Electronically Signed   By: Corlis Leak M.D.   On: 02/02/2017 09:44   Dg Chest Port 1 View  Result Date: 02/08/2017 CLINICAL DATA:  Respiratory failure. EXAM: PORTABLE CHEST 1 VIEW COMPARISON:  02/06/2017 and CT chest 12/23/2016. FINDINGS: Tracheostomy is poorly evaluated due to the patient's head position. Heart size is grossly stable. Lungs are low in volume with mixed interstitial and airspace opacification, likely accentuated by low lung volumes. No definite pleural fluid. IMPRESSION: Favor mild pulmonary edema. Difficult to exclude left perihilar/ infrahilar pneumonia. Electronically Signed   By: Leanna Battles M.D.   On: 02/08/2017 14:35   Dg Chest Port 1 View  Result Date: 02/06/2017 CLINICAL DATA:  Congestion, check trach tube placement and pneumothorax according to chart. EXAM: PORTABLE CHEST 1 VIEW COMPARISON:  Chest x-rays dated 01/31/2017 and 01/28/2017. FINDINGS: Tracheostomy with a slightly more acute angle than on the previous exam, perhaps accentuated by patient positioning, but with tip again appropriately positioned over the tracheal air column. Cardiomegaly is stable. Central pulmonary vascular congestion and mild bilateral interstitial edema. No pleural effusion or pneumothorax seen. IMPRESSION: 1. Tracheostomy tube with a slightly more acute angle/bend than on the previous exam, likely accentuated by patient's head  positioning, with tip again appropriately positioned over the tracheal air column. 2. Cardiomegaly with central pulmonary vascular congestion and mild bilateral interstitial edema, stable, suggesting mild CHF/volume overload. 3. No pneumothorax seen. Electronically Signed   By: Bary Richard M.D.   On: 02/06/2017 15:42   Dg Chest Port 1 View  Result Date: 01/31/2017 CLINICAL DATA:  Pneumothorax. EXAM: PORTABLE CHEST 1 VIEW COMPARISON:  01/31/2017 at 0658 hours FINDINGS: Tracheostomy tube overlies the airway. Left pleural catheter has been removed. Cardiomediastinal silhouette is unchanged. Basilar lung  opacities are stable to slightly improved on the right and stable to slightly increased on the left. No large pleural effusion or pneumothorax is identified. IMPRESSION: 1. No pneumothorax identified following left chest tube removal. 2. At most minimally changed bibasilar opacities, possibly atelectasis. Electronically Signed   By: Sebastian Ache M.D.   On: 01/31/2017 20:18   Dg Chest Port 1 View  Result Date: 01/31/2017 CLINICAL DATA:  Atelectasis.  Tracheostomy present. EXAM: PORTABLE CHEST 1 VIEW COMPARISON:  January 28, 2017 FINDINGS: Tracheostomy the catheter tip is 4.3 cm above the carina. Chest catheter present. No evident pneumothorax. There is atelectatic change in both mid and lower lung zones, stable. There is no consolidation. Heart is mildly enlarged with pulmonary vascularity within normal limits. No bone lesions. IMPRESSION: Tube positions as described without evident pneumothorax. Patchy atelectasis bilaterally without frank consolidation. Stable cardiac prominence. Electronically Signed   By: Bretta Bang III M.D.   On: 01/31/2017 07:57   Dg Chest Port 1 View  Result Date: 01/28/2017 CLINICAL DATA:  Pneumothorax. EXAM: PORTABLE CHEST 1 VIEW COMPARISON:  Chest x-rays dated 01/27/2017, 01/23/2017 and 01/22/2017. FINDINGS: Tracheostomy appears stable in position. Overall cardiomediastinal silhouette is grossly stable given slight differences in patient positioning. Improved aeration of the right lung on today's exam suggesting continued improvement in fluid status. Left lung appears stable compared to yesterday's exam, improved compared to earlier chest x-rays. No pneumothorax seen. Chest tube is stable in position at the left lung base. IMPRESSION: 1. Chest tube is stable in position at the left lung base. No pneumothorax seen. 2. Both lungs demonstrate improved aeration compared to earlier exams indicating improved fluid status. Electronically Signed   By: Bary Richard M.D.   On: 01/28/2017  09:01   Dg Chest Port 1 View  Result Date: 01/27/2017 CLINICAL DATA:  Pneumothorax, chest tube. EXAM: PORTABLE CHEST 1 VIEW COMPARISON:  Chest x-rays dated 01/23/2017 and 01/22/2017. FINDINGS: Tracheostomy tube appears grossly stable in position with tip again approximately 5 cm above the level of the carina. Cardiomediastinal silhouette appears stable. The left-sided pneumothorax described on previous exams is not seen on today's study. Chest tube again noted at the left lung base appear Interstitial edema is stable or slightly decreased compared to the previous exam. No new lung findings. IMPRESSION: 1. No left-sided pneumothorax is appreciated on today's study, apparently resolved since the previous study. Chest tube at the left lung base appears stable in position. 2. Bilateral interstitial edema, right greater than left, is stable or slightly decreased compared to the previous chest x-ray of 01/23/2017, suggesting improved fluid status. Electronically Signed   By: Bary Richard M.D.   On: 01/27/2017 12:52   Dg Chest Port 1 View  Result Date: 01/23/2017 CLINICAL DATA:  Respiratory failure with chest tube present EXAM: PORTABLE CHEST 1 VIEW COMPARISON:  January 22, 2017 FINDINGS: Tracheostomy catheter tip is 5.5 cm above the carina. Nasogastric tube tip and side port are in the stomach. There  is a chest tube on the left. Left-sided pneumothorax is slightly smaller. No tension component. There is extensive subcutaneous air. There is also fairly extensive pneumomediastinum. There is underlying interstitial edema. There has been partial clearing of airspace opacity in the left mid lower lung zones compared to 1 day prior. No new opacity. Heart is mildly enlarged with pulmonary vascularity within normal limits. No adenopathy. There is an old healed fracture of the right clavicle. IMPRESSION: Left chest tube remains in place with left sided pneumothorax slightly smaller. Extensive pneumomediastinum remains. Other  tube positions are unchanged. Partial but incomplete clearing of airspace consolidation the left mid and lower lung zones. There are areas of patchy atelectasis bilaterally as well as mild interstitial edema. Stable cardiomegaly. Electronically Signed   By: Bretta Bang III M.D.   On: 01/23/2017 07:26   Dg Chest Portable 1 View  Result Date: 01/22/2017 CLINICAL DATA:  Left-sided chest tube placement.  Initial encounter. EXAM: PORTABLE CHEST 1 VIEW COMPARISON:  Chest radiograph performed 01/21/2017 FINDINGS: There has been interval placement of a left basilar chest tube, with apparent mild interval improvement in the patient's moderate left-sided pneumothorax. However, there appears to be increased prominence of air surrounding the mediastinum. This is thought most likely to reflect worsening pneumomediastinum due to air tracking from the patient's tracheostomy tube. Worsening soft tissue air is seen along the left chest wall, and diffuse soft tissue air is again seen tracking about the neck. Patchy bilateral airspace opacity, more prominent at the left lung base, raises concern for multifocal pneumonia. No pleural effusion is seen. The cardiomediastinal silhouette is borderline enlarged. No acute osseous abnormalities are identified. IMPRESSION: 1. Interval placement of left basilar chest tube, with apparent mild interval improvement in the patient's moderate left-sided pneumothorax. 2. Increased prominence of air surrounding the mediastinum. This is thought most likely to reflect worsening pneumomediastinum due to air dissecting from the patient's tracheostomy tube. 3. Worsening soft tissue air along the left chest wall. Diffuse soft tissue air again seen tracking about the neck. 4. Patchy bilateral airspace opacity, particularly at the left lung base, raises concern for multifocal pneumonia, more prominent than on the prior studies. 5. Borderline cardiomegaly. Critical Value/emergent results were called by  telephone at the time of interpretation on 01/22/2017 at 3:50 am to Dr. Ross Marcus, who verbally acknowledged these results. Electronically Signed   By: [name] M.D.   On: 01/22/2017 03:52   Dg Chest Port 1 View  Result Date: 01/22/2017 CLINICAL DATA:  Assess tracheostomy tube position. Initial encounter. EXAM: PORTABLE CHEST 1 VIEW COMPARISON:  Chest radiograph performed 01/17/2017 FINDINGS: The tracheostomy tube is noted ending 5-6 cm above the carina. There is a large amount of new soft tissue air tracking about the neck and left chest wall. An associated moderate to large left-sided pneumothorax is seen, likely measuring at least 40-50% of left lung volume. New patchy right-sided airspace opacity may reflect interstitial edema or possibly infection. No pleural effusion is seen. The cardiomediastinal silhouette is borderline normal in size. No acute osseous abnormalities are identified. An enteric tube is noted extending below the diaphragm. IMPRESSION: 1. Moderate to large left-sided pneumothorax, likely measuring at least 40-50% of left lung volume, new from the prior study. 2. Large amount of new soft tissue air tracking about the neck and left chest wall. Given recent replacement of tracheostomy tube, the pneumothorax and soft tissue air likely reflects extension of air from the level of the tracheostomy. 3. New patchy right-sided airspace  opacity may reflect interstitial edema or possibly infection. Critical Value/emergent results were called by telephone at the time of interpretation on 01/22/2017 at 12:40 am to Quenten Raven at [facility], who verbally acknowledged these results. Electronically Signed   By: [name] M.D.   On: 01/22/2017 00:44   Dg Chest Port 1 View  Result Date: 01/17/2017 CLINICAL DATA:  Respiratory failure. EXAM: PORTABLE CHEST 1 VIEW COMPARISON:  01/07/2017. FINDINGS: Interim placement of tracheostomy tube, its tip is in good anatomic position. NG  tube in stable position. Cardiomegaly with pulmonary vascular prominence and bilateral interstitial prominence consistent CHF. Similar findings noted on prior exam. Tiny bilateral pleural effusions cannot be excluded. No pneumothorax P IMPRESSION: 1. Interim placement of tracheostomy tube, its tip is in good anatomic position. NG tube noted in good anatomic position. 2. Cardiomegaly with pulmonary vascular prominence and bilateral interstitial prominence consistent with CHF. Similar findings on prior exam . Electronically Signed   By: Maisie Fus  Register   On: 01/17/2017 07:24   Dg Abd Portable 1v  Result Date: 01/28/2017 CLINICAL DATA:  61 year old male with a history of nasogastric tube placement EXAM: PORTABLE ABDOMEN - 1 VIEW COMPARISON:  None. FINDINGS: Limited plain film of the upper abdomen. Nasogastric tube terminates above the diaphragm to the right of the mediastinum. Pigtail drainage catheter projects over the lower left chest, unchanged. IMPRESSION: Limited plain film demonstrates nasogastric tube terminating to the right of the mediastinum above the diaphragm, likely within a bronchus/airway. These results were called by telephone at the time of interpretation on 01/28/2017 at 11:57 am to nurse caring for the patient, Ms Kenn File, who verbally acknowledged these results. They have confirmed removal before the phone call. Electronically Signed   By: Gilmer Mor D.O.   On: 01/28/2017 11:59   Dg Abd Portable 1v  Result Date: 01/26/2017 CLINICAL DATA:  NG tube placement EXAM: PORTABLE ABDOMEN - 1 VIEW COMPARISON:  01/26/2017 FINDINGS: A left lower pigtail catheter is in place with adjacent small subcutaneous emphysema. The tip of the esophageal tube is poorly visualized, due to patient motion. It is suspected to be in the region of distal esophagus. Upper gas pattern grossly unremarkable. IMPRESSION: Difficult visualization of esophageal tube due to patient motion, suspect that the tube tip overlies the  distal esophagus or GE junction, consider further advancement. Electronically Signed   By: Jasmine Pang M.D.   On: 01/26/2017 22:32   Dg Abd Portable 1v  Result Date: 01/26/2017 CLINICAL DATA:  NG tube placement EXAM: PORTABLE ABDOMEN - 1 VIEW COMPARISON:  Abdominal radiograph 01/26/2017 at 4:49 p.m. FINDINGS: Basilar left-sided chest tube is in unchanged position. The nasogastric tube is been advanced slightly with tip and side port overlying the gastric body. IMPRESSION: NG tube tip and side port overlie the gastric body. Electronically Signed   By: Deatra Robinson M.D.   On: 01/26/2017 20:20   Dg Abd Portable 1v  Result Date: 01/26/2017 CLINICAL DATA:  Nasogastric tube placement EXAM: PORTABLE ABDOMEN - 1 VIEW COMPARISON:  Portable exam 1649 hours compared to 01/24/2017 FINDINGS: Pigtail drainage catheter at inferior LEFT hemithorax. Nasogastric tube extends into proximal stomach. Visualized bowel gas pattern normal. Bones demineralized. IMPRESSION: Nasogastric tube tip projects over proximal stomach. Electronically Signed   By: Ulyses Southward M.D.   On: 01/26/2017 16:58   Dg Abd Portable 1v  Result Date: 01/24/2017 CLINICAL DATA:  NG tube placement EXAM: PORTABLE ABDOMEN - 1 VIEW COMPARISON:  01/24/2017 FINDINGS: Esophageal tube tip and  side-port project over the proximal stomach. Gas pattern nonobstructed. Aortic atherosclerosis IMPRESSION: Esophageal tube tip and side-port project over the proximal stomach Electronically Signed   By: Jasmine Pang M.D.   On: 01/24/2017 18:02   Dg Abd Portable 1v  Result Date: 01/24/2017 CLINICAL DATA:  Nasogastric tube placement. EXAM: PORTABLE ABDOMEN - 1 VIEW COMPARISON:  Earlier today. FINDINGS: Normal bowel gas pattern. Nasogastric tube tip in the proximal stomach and side hole in the region of the gastroesophageal junction. Atheromatous arterial calcifications, including the abdominal aorta. Mild lumbar spine degenerative changes. Increased patchy density at the  left lung base. IMPRESSION: 1. Nasogastric tube tip in the proximal stomach and side hole in the region of the gastroesophageal junction. It is recommended that this be advanced farther into the stomach. 2. Aortic atherosclerosis. 3. Increased patchy atelectasis or pneumonia at the left lung base. Electronically Signed   By: Beckie Salts M.D.   On: 01/24/2017 13:44   Dg Abd Portable 1v  Result Date: 01/24/2017 CLINICAL DATA:  NG tube placement EXAM: PORTABLE ABDOMEN - 1 VIEW COMPARISON:  CXR from earlier on the same day. Abdomen radiographs from 03/29 8 seen. FINDINGS: Stable cardiomegaly with patchy airspace opacities in the right upper lobe, left mid lung and left lung base. Gastric tube extends below the left hemidiaphragm with tip and side-port in the region of the gastric fundus and body. Left-sided chest tube projects over the left lung base with subcutaneous emphysema noted bilaterally. Degenerative change noted of the mid thoracic spine. IMPRESSION: Gastric tube extends into the fundus and body of the stomach. Stable cardiomegaly with left-sided chest tube in place. Patchy airspace opacities in the visualized right upper lobe and left lung base. Subcutaneous emphysema is seen bilaterally. Electronically Signed   By: Tollie Eth M.D.   On: 01/24/2017 02:55   Dg Abd Portable 1v  Result Date: 01/21/2017 CLINICAL DATA:  Nasogastric tube placement.  Initial encounter. EXAM: PORTABLE ABDOMEN - 1 VIEW COMPARISON:  Abdominal radiograph performed earlier today at 10:11 p.m. FINDINGS: The patient's enteric tube is noted ending overlying the fundus of the stomach. The visualized bowel gas pattern is grossly unremarkable. No free intra-abdominal air is seen, though evaluation for free air is limited on a single supine view. No acute osseous abnormalities are identified. IMPRESSION: Enteric tube noted ending overlying the fundus of the stomach. Electronically Signed   By: [name] M.D.   On: 01/21/2017 02:34    Dg Abd Portable 1v  Result Date: 01/19/2017 CLINICAL DATA:  Nasogastric tube placement.  Initial encounter. EXAM: PORTABLE ABDOMEN - 1 VIEW COMPARISON:  Abdominal radiograph performed earlier today at 10:11 p.m. FINDINGS: The patient's enteric tube is noted ending overlying the fundus of the stomach. The visualized bowel gas pattern is grossly unremarkable. No free intra-abdominal air is seen, though evaluation for free air is limited on a single supine view. No acute osseous abnormalities are identified. IMPRESSION: Enteric tube noted ending overlying the fundus of the stomach. Electronically Signed   By: [name] M.D.   On: 01/19/2017 23:24   Dg Abd Portable 1v  Result Date: 01/19/2017 CLINICAL DATA:  Nasogastric tube placement.  Initial encounter. EXAM: PORTABLE ABDOMEN - 1 VIEW COMPARISON:  Abdominal radiograph performed 01/07/2017 FINDINGS: The patient's enteric tube is noted ending overlying the distal esophagus. This could be advanced at least 10 cm. The visualized bowel gas pattern is grossly unremarkable. No free intra-abdominal air is seen, though evaluation for free air is limited on a  single supine view. No acute osseous abnormalities are identified. IMPRESSION: Enteric tube noted ending overlying the distal esophagus. This could be advanced at least 10 cm. Electronically Signed   By: [name] M.D.   On: 01/19/2017 22:34   Dg Hip Unilat With Pelvis 2-3 Views Left  Result Date: 02/07/2017 CLINICAL DATA:  Acute onset of left hip pain, status post recent fracture. Initial encounter. EXAM: DG HIP (WITH OR WITHOUT PELVIS) 2-3V LEFT COMPARISON:  Left hip radiographs performed 12/16/2016 FINDINGS: The comminuted subcapital fracture of the left femoral neck is again noted, with mild interval resorption. The left femoral head remains seated at the acetabulum. The right hip joint is grossly unremarkable. Scattered vascular calcifications are seen. The sacroiliac joints are grossly  unremarkable. The visualized bowel gas pattern is grossly unremarkable. IMPRESSION: 1. Comminuted subcapital fracture of the left femoral neck again noted, with mild interval resorption. No significant interval healing seen. 2. Scattered vascular calcifications seen. Electronically Signed   By: [name] M.D.   On: 02/07/2017 19:27     Examination:  Wound Exam: clean, dry, intact dressing  Drainage:  None: wound tissue dry  Motor Exam:  Does not move the foot.    Sensory Exam: Unable to determine   Vascular Exam: Cap refill less than 2 seconds  Assessment:    1 Day Post-Op  Procedure(s) (LRB): LEFT HIP HEMIARTHROPLASTY (Left)  ADDITIONAL DIAGNOSIS:  Active Problems:   Closed displaced fracture of left femoral neck with nonunion    Plan:  Continue present plan      Jacqualine Code  02/11/2017 11:27 AM

## 2017-02-12 LAB — PROTIME-INR
INR: 1.05
PROTHROMBIN TIME: 13.7 s (ref 11.4–15.2)

## 2017-02-13 ENCOUNTER — Other Ambulatory Visit (HOSPITAL_COMMUNITY): Payer: Self-pay

## 2017-02-13 ENCOUNTER — Encounter (HOSPITAL_COMMUNITY): Payer: Self-pay | Admitting: Orthopedic Surgery

## 2017-02-13 LAB — CBC WITH DIFFERENTIAL/PLATELET
Basophils Absolute: 0 10*3/uL (ref 0.0–0.1)
Basophils Relative: 0 %
EOS ABS: 0.5 10*3/uL (ref 0.0–0.7)
Eosinophils Relative: 3 %
HCT: 30.7 % — ABNORMAL LOW (ref 39.0–52.0)
Hemoglobin: 10 g/dL — ABNORMAL LOW (ref 13.0–17.0)
LYMPHS ABS: 4.7 10*3/uL — AB (ref 0.7–4.0)
Lymphocytes Relative: 30 %
MCH: 32.6 pg (ref 26.0–34.0)
MCHC: 32.6 g/dL (ref 30.0–36.0)
MCV: 100 fL (ref 78.0–100.0)
MONO ABS: 1.9 10*3/uL — AB (ref 0.1–1.0)
Monocytes Relative: 12 %
NEUTROS ABS: 8.4 10*3/uL — AB (ref 1.7–7.7)
NEUTROS PCT: 55 %
PLATELETS: 276 10*3/uL (ref 150–400)
RBC: 3.07 MIL/uL — ABNORMAL LOW (ref 4.22–5.81)
RDW: 15.8 % — AB (ref 11.5–15.5)
WBC: 15.5 10*3/uL — ABNORMAL HIGH (ref 4.0–10.5)

## 2017-02-13 LAB — BASIC METABOLIC PANEL
Anion gap: 9 (ref 5–15)
BUN: 39 mg/dL — AB (ref 6–20)
CO2: 31 mmol/L (ref 22–32)
CREATININE: 1.7 mg/dL — AB (ref 0.61–1.24)
Calcium: 9.7 mg/dL (ref 8.9–10.3)
Chloride: 98 mmol/L — ABNORMAL LOW (ref 101–111)
GFR calc Af Amer: 48 mL/min — ABNORMAL LOW (ref >60)
GFR, EST NON AFRICAN AMERICAN: 42 mL/min — AB (ref >60)
Glucose, Bld: 127 mg/dL — ABNORMAL HIGH (ref 65–99)
POTASSIUM: 4.4 mmol/L (ref 3.5–5.1)
SODIUM: 138 mmol/L (ref 135–145)

## 2017-02-13 LAB — ACID FAST CULTURE WITH REFLEXED SENSITIVITIES: ACID FAST CULTURE - AFSCU3: NEGATIVE

## 2017-02-13 LAB — PROTIME-INR
INR: 1.21
Prothrombin Time: 15.4 seconds — ABNORMAL HIGH (ref 11.4–15.2)

## 2017-02-13 LAB — MAGNESIUM: MAGNESIUM: 2.1 mg/dL (ref 1.7–2.4)

## 2017-02-13 LAB — ACID FAST CULTURE WITH REFLEXED SENSITIVITIES (MYCOBACTERIA)

## 2017-02-13 LAB — PHOSPHORUS: Phosphorus: 2.8 mg/dL (ref 2.5–4.6)

## 2017-02-14 LAB — PROTIME-INR
INR: 1.43
PROTHROMBIN TIME: 17.6 s — AB (ref 11.4–15.2)

## 2017-02-15 LAB — CBC
HCT: 26.7 % — ABNORMAL LOW (ref 39.0–52.0)
Hemoglobin: 8.6 g/dL — ABNORMAL LOW (ref 13.0–17.0)
MCH: 32.3 pg (ref 26.0–34.0)
MCHC: 32.2 g/dL (ref 30.0–36.0)
MCV: 100.4 fL — ABNORMAL HIGH (ref 78.0–100.0)
PLATELETS: 308 10*3/uL (ref 150–400)
RBC: 2.66 MIL/uL — ABNORMAL LOW (ref 4.22–5.81)
RDW: 15.9 % — ABNORMAL HIGH (ref 11.5–15.5)
WBC: 19.1 10*3/uL — ABNORMAL HIGH (ref 4.0–10.5)

## 2017-02-15 LAB — BASIC METABOLIC PANEL
Anion gap: 10 (ref 5–15)
BUN: 49 mg/dL — ABNORMAL HIGH (ref 6–20)
CALCIUM: 9.5 mg/dL (ref 8.9–10.3)
CO2: 25 mmol/L (ref 22–32)
CREATININE: 1.7 mg/dL — AB (ref 0.61–1.24)
Chloride: 100 mmol/L — ABNORMAL LOW (ref 101–111)
GFR calc non Af Amer: 42 mL/min — ABNORMAL LOW (ref >60)
GFR, EST AFRICAN AMERICAN: 48 mL/min — AB (ref >60)
Glucose, Bld: 161 mg/dL — ABNORMAL HIGH (ref 65–99)
Potassium: 4.5 mmol/L (ref 3.5–5.1)
SODIUM: 135 mmol/L (ref 135–145)

## 2017-02-15 LAB — PROTIME-INR
INR: 1.55
PROTHROMBIN TIME: 18.8 s — AB (ref 11.4–15.2)

## 2017-02-16 LAB — PROTIME-INR
INR: 1.78
Prothrombin Time: 20.9 seconds — ABNORMAL HIGH (ref 11.4–15.2)

## 2017-02-17 LAB — PROTIME-INR
INR: 2.07
Prothrombin Time: 23.6 seconds — ABNORMAL HIGH (ref 11.4–15.2)

## 2017-02-18 LAB — PROTIME-INR
INR: 2.1
PROTHROMBIN TIME: 23.9 s — AB (ref 11.4–15.2)

## 2017-02-19 LAB — PROTIME-INR
INR: 2.73
PROTHROMBIN TIME: 29.5 s — AB (ref 11.4–15.2)

## 2017-02-20 ENCOUNTER — Other Ambulatory Visit (HOSPITAL_COMMUNITY): Payer: Self-pay

## 2017-02-20 DIAGNOSIS — R918 Other nonspecific abnormal finding of lung field: Secondary | ICD-10-CM | POA: Diagnosis not present

## 2017-02-20 LAB — BASIC METABOLIC PANEL
Anion gap: 8 (ref 5–15)
BUN: 74 mg/dL — AB (ref 6–20)
CALCIUM: 10.1 mg/dL (ref 8.9–10.3)
CO2: 32 mmol/L (ref 22–32)
Chloride: 100 mmol/L — ABNORMAL LOW (ref 101–111)
Creatinine, Ser: 1.81 mg/dL — ABNORMAL HIGH (ref 0.61–1.24)
GFR calc Af Amer: 45 mL/min — ABNORMAL LOW (ref >60)
GFR, EST NON AFRICAN AMERICAN: 39 mL/min — AB (ref >60)
Glucose, Bld: 138 mg/dL — ABNORMAL HIGH (ref 65–99)
POTASSIUM: 4.2 mmol/L (ref 3.5–5.1)
SODIUM: 140 mmol/L (ref 135–145)

## 2017-02-20 LAB — CBC
HCT: 25 % — ABNORMAL LOW (ref 39.0–52.0)
Hemoglobin: 8 g/dL — ABNORMAL LOW (ref 13.0–17.0)
MCH: 32.3 pg (ref 26.0–34.0)
MCHC: 32 g/dL (ref 30.0–36.0)
MCV: 100.8 fL — ABNORMAL HIGH (ref 78.0–100.0)
PLATELETS: 572 10*3/uL — AB (ref 150–400)
RBC: 2.48 MIL/uL — AB (ref 4.22–5.81)
RDW: 16.3 % — AB (ref 11.5–15.5)
WBC: 18.4 10*3/uL — ABNORMAL HIGH (ref 4.0–10.5)

## 2017-02-20 LAB — PROTIME-INR
INR: 3.07
PROTHROMBIN TIME: 32.4 s — AB (ref 11.4–15.2)

## 2017-02-21 LAB — PROTIME-INR
INR: 2.74
Prothrombin Time: 29.6 seconds — ABNORMAL HIGH (ref 11.4–15.2)

## 2017-02-22 LAB — PROTIME-INR
INR: 2.32
Prothrombin Time: 25.9 seconds — ABNORMAL HIGH (ref 11.4–15.2)

## 2017-02-23 LAB — PROTIME-INR
INR: 2.39
PROTHROMBIN TIME: 26.5 s — AB (ref 11.4–15.2)

## 2017-02-24 LAB — PROTIME-INR
INR: 2.57
Prothrombin Time: 28.1 seconds — ABNORMAL HIGH (ref 11.4–15.2)

## 2017-02-26 LAB — PROTIME-INR
INR: 2.74
Prothrombin Time: 29.5 seconds — ABNORMAL HIGH (ref 11.4–15.2)

## 2017-02-27 LAB — PROTIME-INR
INR: 2.79
Prothrombin Time: 30 seconds — ABNORMAL HIGH (ref 11.4–15.2)

## 2017-02-28 LAB — PROTIME-INR
INR: 2.91
Prothrombin Time: 31 seconds — ABNORMAL HIGH (ref 11.4–15.2)

## 2017-03-01 LAB — PROTIME-INR
INR: 2.25
PROTHROMBIN TIME: 25.3 s — AB (ref 11.4–15.2)

## 2017-03-02 ENCOUNTER — Other Ambulatory Visit (HOSPITAL_COMMUNITY): Payer: Self-pay

## 2017-03-02 LAB — PROTIME-INR
INR: 2.1
Prothrombin Time: 23.9 seconds — ABNORMAL HIGH (ref 11.4–15.2)

## 2017-03-03 LAB — PROTIME-INR
INR: 2.23
Prothrombin Time: 25.1 seconds — ABNORMAL HIGH (ref 11.4–15.2)

## 2017-03-04 LAB — PROTIME-INR
INR: 2.91
PROTHROMBIN TIME: 31 s — AB (ref 11.4–15.2)

## 2017-03-05 LAB — BASIC METABOLIC PANEL
ANION GAP: 14 (ref 5–15)
BUN: 66 mg/dL — ABNORMAL HIGH (ref 6–20)
CHLORIDE: 109 mmol/L (ref 101–111)
CO2: 20 mmol/L — ABNORMAL LOW (ref 22–32)
CREATININE: 2.17 mg/dL — AB (ref 0.61–1.24)
Calcium: 8.7 mg/dL — ABNORMAL LOW (ref 8.9–10.3)
GFR calc non Af Amer: 31 mL/min — ABNORMAL LOW (ref >60)
GFR, EST AFRICAN AMERICAN: 36 mL/min — AB (ref >60)
Glucose, Bld: 131 mg/dL — ABNORMAL HIGH (ref 65–99)
Potassium: 5.1 mmol/L (ref 3.5–5.1)
SODIUM: 143 mmol/L (ref 135–145)

## 2017-03-05 LAB — CBC
HCT: 28.1 % — ABNORMAL LOW (ref 39.0–52.0)
HEMOGLOBIN: 8.7 g/dL — AB (ref 13.0–17.0)
MCH: 32.2 pg (ref 26.0–34.0)
MCHC: 31 g/dL (ref 30.0–36.0)
MCV: 104.1 fL — AB (ref 78.0–100.0)
PLATELETS: 245 10*3/uL (ref 150–400)
RBC: 2.7 MIL/uL — AB (ref 4.22–5.81)
RDW: 17.1 % — ABNORMAL HIGH (ref 11.5–15.5)
WBC: 15.8 10*3/uL — ABNORMAL HIGH (ref 4.0–10.5)

## 2017-03-05 LAB — PROTIME-INR
INR: 2.62
PROTHROMBIN TIME: 28.5 s — AB (ref 11.4–15.2)

## 2017-03-06 ENCOUNTER — Other Ambulatory Visit (HOSPITAL_COMMUNITY): Payer: Self-pay

## 2017-03-06 DIAGNOSIS — J9690 Respiratory failure, unspecified, unspecified whether with hypoxia or hypercapnia: Secondary | ICD-10-CM | POA: Diagnosis not present

## 2017-03-06 LAB — PROTIME-INR
INR: 2.35
PROTHROMBIN TIME: 26.2 s — AB (ref 11.4–15.2)

## 2017-03-07 LAB — PROTIME-INR
INR: 2.32
PROTHROMBIN TIME: 25.9 s — AB (ref 11.4–15.2)

## 2017-03-08 LAB — PROTIME-INR
INR: 2.06
Prothrombin Time: 23.5 seconds — ABNORMAL HIGH (ref 11.4–15.2)

## 2017-03-09 LAB — CBC WITH DIFFERENTIAL/PLATELET
BASOS PCT: 1 %
Basophils Absolute: 0.1 10*3/uL (ref 0.0–0.1)
EOS ABS: 0.8 10*3/uL — AB (ref 0.0–0.7)
Eosinophils Relative: 7 %
HCT: 33 % — ABNORMAL LOW (ref 39.0–52.0)
Hemoglobin: 10.2 g/dL — ABNORMAL LOW (ref 13.0–17.0)
LYMPHS ABS: 4.2 10*3/uL — AB (ref 0.7–4.0)
Lymphocytes Relative: 39 %
MCH: 32.2 pg (ref 26.0–34.0)
MCHC: 30.9 g/dL (ref 30.0–36.0)
MCV: 104.1 fL — AB (ref 78.0–100.0)
MONO ABS: 1.2 10*3/uL — AB (ref 0.1–1.0)
Monocytes Relative: 11 %
NEUTROS PCT: 42 %
Neutro Abs: 4.4 10*3/uL (ref 1.7–7.7)
PLATELETS: 253 10*3/uL (ref 150–400)
RBC: 3.17 MIL/uL — AB (ref 4.22–5.81)
RDW: 16 % — AB (ref 11.5–15.5)
WBC: 10.7 10*3/uL — AB (ref 4.0–10.5)

## 2017-03-09 LAB — BASIC METABOLIC PANEL
Anion gap: 11 (ref 5–15)
BUN: 57 mg/dL — AB (ref 6–20)
CALCIUM: 8.9 mg/dL (ref 8.9–10.3)
CO2: 26 mmol/L (ref 22–32)
Chloride: 104 mmol/L (ref 101–111)
Creatinine, Ser: 1.97 mg/dL — ABNORMAL HIGH (ref 0.61–1.24)
GFR calc Af Amer: 40 mL/min — ABNORMAL LOW (ref >60)
GFR, EST NON AFRICAN AMERICAN: 35 mL/min — AB (ref >60)
GLUCOSE: 81 mg/dL (ref 65–99)
POTASSIUM: 3.8 mmol/L (ref 3.5–5.1)
SODIUM: 141 mmol/L (ref 135–145)

## 2017-03-09 LAB — PROTIME-INR
INR: 1.98
PROTHROMBIN TIME: 22.8 s — AB (ref 11.4–15.2)

## 2017-03-10 LAB — PROTIME-INR
INR: 2.13
Prothrombin Time: 24.2 seconds — ABNORMAL HIGH (ref 11.4–15.2)

## 2017-03-11 LAB — PROTIME-INR
INR: 2.17
Prothrombin Time: 24.5 seconds — ABNORMAL HIGH (ref 11.4–15.2)

## 2017-03-12 LAB — PROTIME-INR
INR: 2.01
PROTHROMBIN TIME: 23 s — AB (ref 11.4–15.2)

## 2017-03-13 LAB — PROTIME-INR
INR: 2.12
Prothrombin Time: 24.1 seconds — ABNORMAL HIGH (ref 11.4–15.2)

## 2017-03-14 LAB — PROTIME-INR
INR: 1.95
PROTHROMBIN TIME: 22.6 s — AB (ref 11.4–15.2)

## 2017-03-15 LAB — PROTIME-INR
INR: 1.8
PROTHROMBIN TIME: 21.1 s — AB (ref 11.4–15.2)

## 2017-03-16 DIAGNOSIS — N189 Chronic kidney disease, unspecified: Secondary | ICD-10-CM | POA: Diagnosis not present

## 2017-03-16 DIAGNOSIS — I130 Hypertensive heart and chronic kidney disease with heart failure and stage 1 through stage 4 chronic kidney disease, or unspecified chronic kidney disease: Secondary | ICD-10-CM | POA: Diagnosis present

## 2017-03-16 DIAGNOSIS — E876 Hypokalemia: Secondary | ICD-10-CM | POA: Diagnosis present

## 2017-03-16 DIAGNOSIS — I429 Cardiomyopathy, unspecified: Secondary | ICD-10-CM | POA: Diagnosis not present

## 2017-03-16 DIAGNOSIS — I340 Nonrheumatic mitral (valve) insufficiency: Secondary | ICD-10-CM | POA: Diagnosis present

## 2017-03-16 DIAGNOSIS — J410 Simple chronic bronchitis: Secondary | ICD-10-CM | POA: Diagnosis not present

## 2017-03-16 DIAGNOSIS — I509 Heart failure, unspecified: Secondary | ICD-10-CM | POA: Diagnosis not present

## 2017-03-16 DIAGNOSIS — R1310 Dysphagia, unspecified: Secondary | ICD-10-CM | POA: Diagnosis present

## 2017-03-16 DIAGNOSIS — R41841 Cognitive communication deficit: Secondary | ICD-10-CM | POA: Diagnosis not present

## 2017-03-16 DIAGNOSIS — E7800 Pure hypercholesterolemia, unspecified: Secondary | ICD-10-CM | POA: Diagnosis present

## 2017-03-16 DIAGNOSIS — M5489 Other dorsalgia: Secondary | ICD-10-CM | POA: Diagnosis not present

## 2017-03-16 DIAGNOSIS — M6281 Muscle weakness (generalized): Secondary | ICD-10-CM | POA: Diagnosis not present

## 2017-03-16 DIAGNOSIS — R05 Cough: Secondary | ICD-10-CM | POA: Diagnosis not present

## 2017-03-16 DIAGNOSIS — Z9981 Dependence on supplemental oxygen: Secondary | ICD-10-CM | POA: Diagnosis not present

## 2017-03-16 DIAGNOSIS — N179 Acute kidney failure, unspecified: Secondary | ICD-10-CM | POA: Diagnosis not present

## 2017-03-16 DIAGNOSIS — J189 Pneumonia, unspecified organism: Secondary | ICD-10-CM | POA: Diagnosis not present

## 2017-03-16 DIAGNOSIS — I5023 Acute on chronic systolic (congestive) heart failure: Secondary | ICD-10-CM | POA: Diagnosis not present

## 2017-03-16 DIAGNOSIS — I252 Old myocardial infarction: Secondary | ICD-10-CM | POA: Diagnosis not present

## 2017-03-16 DIAGNOSIS — R0602 Shortness of breath: Secondary | ICD-10-CM | POA: Diagnosis not present

## 2017-03-16 DIAGNOSIS — F320 Major depressive disorder, single episode, mild: Secondary | ICD-10-CM | POA: Diagnosis not present

## 2017-03-16 DIAGNOSIS — S31102A Unspecified open wound of abdominal wall, epigastric region without penetration into peritoneal cavity, initial encounter: Secondary | ICD-10-CM | POA: Diagnosis not present

## 2017-03-16 DIAGNOSIS — F419 Anxiety disorder, unspecified: Secondary | ICD-10-CM | POA: Diagnosis not present

## 2017-03-16 DIAGNOSIS — J9600 Acute respiratory failure, unspecified whether with hypoxia or hypercapnia: Secondary | ICD-10-CM | POA: Diagnosis not present

## 2017-03-16 DIAGNOSIS — Z87891 Personal history of nicotine dependence: Secondary | ICD-10-CM | POA: Diagnosis not present

## 2017-03-16 DIAGNOSIS — I513 Intracardiac thrombosis, not elsewhere classified: Secondary | ICD-10-CM | POA: Diagnosis not present

## 2017-03-16 DIAGNOSIS — I5043 Acute on chronic combined systolic (congestive) and diastolic (congestive) heart failure: Secondary | ICD-10-CM | POA: Diagnosis present

## 2017-03-16 DIAGNOSIS — R2689 Other abnormalities of gait and mobility: Secondary | ICD-10-CM | POA: Diagnosis not present

## 2017-03-16 DIAGNOSIS — F329 Major depressive disorder, single episode, unspecified: Secondary | ICD-10-CM | POA: Diagnosis present

## 2017-03-16 DIAGNOSIS — G8194 Hemiplegia, unspecified affecting left nondominant side: Secondary | ICD-10-CM | POA: Diagnosis not present

## 2017-03-16 DIAGNOSIS — D649 Anemia, unspecified: Secondary | ICD-10-CM | POA: Diagnosis not present

## 2017-03-16 DIAGNOSIS — I2510 Atherosclerotic heart disease of native coronary artery without angina pectoris: Secondary | ICD-10-CM | POA: Diagnosis present

## 2017-03-16 DIAGNOSIS — S3600XA Unspecified injury of spleen, initial encounter: Secondary | ICD-10-CM | POA: Diagnosis not present

## 2017-03-16 DIAGNOSIS — I5022 Chronic systolic (congestive) heart failure: Secondary | ICD-10-CM | POA: Diagnosis not present

## 2017-03-16 DIAGNOSIS — J9690 Respiratory failure, unspecified, unspecified whether with hypoxia or hypercapnia: Secondary | ICD-10-CM | POA: Diagnosis not present

## 2017-03-16 DIAGNOSIS — I255 Ischemic cardiomyopathy: Secondary | ICD-10-CM | POA: Diagnosis present

## 2017-03-16 DIAGNOSIS — I472 Ventricular tachycardia: Secondary | ICD-10-CM | POA: Diagnosis present

## 2017-03-16 DIAGNOSIS — I69354 Hemiplegia and hemiparesis following cerebral infarction affecting left non-dominant side: Secondary | ICD-10-CM | POA: Diagnosis not present

## 2017-03-16 DIAGNOSIS — Z931 Gastrostomy status: Secondary | ICD-10-CM | POA: Diagnosis not present

## 2017-03-16 DIAGNOSIS — I6930 Unspecified sequelae of cerebral infarction: Secondary | ICD-10-CM | POA: Diagnosis not present

## 2017-03-16 DIAGNOSIS — F0281 Dementia in other diseases classified elsewhere with behavioral disturbance: Secondary | ICD-10-CM | POA: Diagnosis not present

## 2017-03-16 DIAGNOSIS — Z794 Long term (current) use of insulin: Secondary | ICD-10-CM | POA: Diagnosis not present

## 2017-03-16 DIAGNOSIS — J411 Mucopurulent chronic bronchitis: Secondary | ICD-10-CM | POA: Diagnosis not present

## 2017-03-16 DIAGNOSIS — R609 Edema, unspecified: Secondary | ICD-10-CM | POA: Diagnosis not present

## 2017-03-16 DIAGNOSIS — R069 Unspecified abnormalities of breathing: Secondary | ICD-10-CM | POA: Diagnosis not present

## 2017-03-16 DIAGNOSIS — J9621 Acute and chronic respiratory failure with hypoxia: Secondary | ICD-10-CM | POA: Diagnosis not present

## 2017-03-16 DIAGNOSIS — Z955 Presence of coronary angioplasty implant and graft: Secondary | ICD-10-CM | POA: Diagnosis not present

## 2017-03-16 DIAGNOSIS — S72009A Fracture of unspecified part of neck of unspecified femur, initial encounter for closed fracture: Secondary | ICD-10-CM | POA: Diagnosis not present

## 2017-03-16 DIAGNOSIS — F0390 Unspecified dementia without behavioral disturbance: Secondary | ICD-10-CM | POA: Diagnosis present

## 2017-03-16 DIAGNOSIS — J690 Pneumonitis due to inhalation of food and vomit: Secondary | ICD-10-CM | POA: Diagnosis not present

## 2017-03-16 DIAGNOSIS — E119 Type 2 diabetes mellitus without complications: Secondary | ICD-10-CM | POA: Diagnosis not present

## 2017-03-16 DIAGNOSIS — J188 Other pneumonia, unspecified organism: Secondary | ICD-10-CM | POA: Diagnosis not present

## 2017-03-16 DIAGNOSIS — Z930 Tracheostomy status: Secondary | ICD-10-CM | POA: Diagnosis not present

## 2017-03-16 DIAGNOSIS — E46 Unspecified protein-calorie malnutrition: Secondary | ICD-10-CM | POA: Diagnosis present

## 2017-03-16 DIAGNOSIS — R296 Repeated falls: Secondary | ICD-10-CM | POA: Diagnosis present

## 2017-03-16 DIAGNOSIS — N184 Chronic kidney disease, stage 4 (severe): Secondary | ICD-10-CM | POA: Diagnosis present

## 2017-03-16 DIAGNOSIS — J449 Chronic obstructive pulmonary disease, unspecified: Secondary | ICD-10-CM | POA: Diagnosis present

## 2017-03-16 LAB — PROTIME-INR
INR: 1.62
Prothrombin Time: 19.4 seconds — ABNORMAL HIGH (ref 11.4–15.2)

## 2017-03-17 ENCOUNTER — Inpatient Hospital Stay (HOSPITAL_COMMUNITY)
Admission: EM | Admit: 2017-03-17 | Discharge: 2017-03-30 | DRG: 177 | Disposition: A | Discharge status: Skilled Nursing Facility | Payer: Medicare Other | Attending: Internal Medicine | Admitting: Internal Medicine

## 2017-03-17 ENCOUNTER — Encounter (HOSPITAL_COMMUNITY): Payer: Self-pay | Admitting: *Deleted

## 2017-03-17 ENCOUNTER — Emergency Department (HOSPITAL_COMMUNITY): Payer: Medicare Other

## 2017-03-17 ENCOUNTER — Encounter: Payer: Self-pay | Admitting: Adult Health

## 2017-03-17 ENCOUNTER — Non-Acute Institutional Stay (SKILLED_NURSING_FACILITY): Payer: Medicare Other | Admitting: Adult Health

## 2017-03-17 DIAGNOSIS — R0902 Hypoxemia: Secondary | ICD-10-CM

## 2017-03-17 DIAGNOSIS — J811 Chronic pulmonary edema: Secondary | ICD-10-CM

## 2017-03-17 DIAGNOSIS — Z7982 Long term (current) use of aspirin: Secondary | ICD-10-CM

## 2017-03-17 DIAGNOSIS — E1122 Type 2 diabetes mellitus with diabetic chronic kidney disease: Secondary | ICD-10-CM | POA: Diagnosis present

## 2017-03-17 DIAGNOSIS — Z955 Presence of coronary angioplasty implant and graft: Secondary | ICD-10-CM

## 2017-03-17 DIAGNOSIS — E7800 Pure hypercholesterolemia, unspecified: Secondary | ICD-10-CM | POA: Diagnosis present

## 2017-03-17 DIAGNOSIS — I5043 Acute on chronic combined systolic (congestive) and diastolic (congestive) heart failure: Secondary | ICD-10-CM | POA: Diagnosis present

## 2017-03-17 DIAGNOSIS — Z9889 Other specified postprocedural states: Secondary | ICD-10-CM

## 2017-03-17 DIAGNOSIS — R1310 Dysphagia, unspecified: Secondary | ICD-10-CM | POA: Diagnosis present

## 2017-03-17 DIAGNOSIS — I472 Ventricular tachycardia: Secondary | ICD-10-CM | POA: Diagnosis present

## 2017-03-17 DIAGNOSIS — E119 Type 2 diabetes mellitus without complications: Secondary | ICD-10-CM

## 2017-03-17 DIAGNOSIS — R296 Repeated falls: Secondary | ICD-10-CM | POA: Diagnosis present

## 2017-03-17 DIAGNOSIS — F329 Major depressive disorder, single episode, unspecified: Secondary | ICD-10-CM | POA: Diagnosis present

## 2017-03-17 DIAGNOSIS — N400 Enlarged prostate without lower urinary tract symptoms: Secondary | ICD-10-CM | POA: Diagnosis present

## 2017-03-17 DIAGNOSIS — Z6826 Body mass index (BMI) 26.0-26.9, adult: Secondary | ICD-10-CM

## 2017-03-17 DIAGNOSIS — J690 Pneumonitis due to inhalation of food and vomit: Principal | ICD-10-CM | POA: Diagnosis present

## 2017-03-17 DIAGNOSIS — Z930 Tracheostomy status: Secondary | ICD-10-CM

## 2017-03-17 DIAGNOSIS — I2510 Atherosclerotic heart disease of native coronary artery without angina pectoris: Secondary | ICD-10-CM | POA: Diagnosis present

## 2017-03-17 DIAGNOSIS — K219 Gastro-esophageal reflux disease without esophagitis: Secondary | ICD-10-CM | POA: Diagnosis present

## 2017-03-17 DIAGNOSIS — R0602 Shortness of breath: Secondary | ICD-10-CM

## 2017-03-17 DIAGNOSIS — I69354 Hemiplegia and hemiparesis following cerebral infarction affecting left non-dominant side: Secondary | ICD-10-CM

## 2017-03-17 DIAGNOSIS — I130 Hypertensive heart and chronic kidney disease with heart failure and stage 1 through stage 4 chronic kidney disease, or unspecified chronic kidney disease: Secondary | ICD-10-CM | POA: Diagnosis not present

## 2017-03-17 DIAGNOSIS — D631 Anemia in chronic kidney disease: Secondary | ICD-10-CM | POA: Diagnosis present

## 2017-03-17 DIAGNOSIS — Z79899 Other long term (current) drug therapy: Secondary | ICD-10-CM

## 2017-03-17 DIAGNOSIS — F0390 Unspecified dementia without behavioral disturbance: Secondary | ICD-10-CM | POA: Diagnosis present

## 2017-03-17 DIAGNOSIS — E876 Hypokalemia: Secondary | ICD-10-CM | POA: Diagnosis present

## 2017-03-17 DIAGNOSIS — J9621 Acute and chronic respiratory failure with hypoxia: Secondary | ICD-10-CM

## 2017-03-17 DIAGNOSIS — J9690 Respiratory failure, unspecified, unspecified whether with hypoxia or hypercapnia: Secondary | ICD-10-CM

## 2017-03-17 DIAGNOSIS — R05 Cough: Secondary | ICD-10-CM | POA: Diagnosis not present

## 2017-03-17 DIAGNOSIS — J189 Pneumonia, unspecified organism: Secondary | ICD-10-CM | POA: Diagnosis present

## 2017-03-17 DIAGNOSIS — L89322 Pressure ulcer of left buttock, stage 2: Secondary | ICD-10-CM | POA: Diagnosis present

## 2017-03-17 DIAGNOSIS — E46 Unspecified protein-calorie malnutrition: Secondary | ICD-10-CM | POA: Diagnosis present

## 2017-03-17 DIAGNOSIS — Z9981 Dependence on supplemental oxygen: Secondary | ICD-10-CM

## 2017-03-17 DIAGNOSIS — L89312 Pressure ulcer of right buttock, stage 2: Secondary | ICD-10-CM | POA: Diagnosis present

## 2017-03-17 DIAGNOSIS — I255 Ischemic cardiomyopathy: Secondary | ICD-10-CM | POA: Diagnosis present

## 2017-03-17 DIAGNOSIS — Z931 Gastrostomy status: Secondary | ICD-10-CM

## 2017-03-17 DIAGNOSIS — N179 Acute kidney failure, unspecified: Secondary | ICD-10-CM | POA: Diagnosis present

## 2017-03-17 DIAGNOSIS — J449 Chronic obstructive pulmonary disease, unspecified: Secondary | ICD-10-CM | POA: Diagnosis present

## 2017-03-17 DIAGNOSIS — I340 Nonrheumatic mitral (valve) insufficiency: Secondary | ICD-10-CM | POA: Diagnosis present

## 2017-03-17 DIAGNOSIS — Z87891 Personal history of nicotine dependence: Secondary | ICD-10-CM

## 2017-03-17 DIAGNOSIS — I513 Intracardiac thrombosis, not elsewhere classified: Secondary | ICD-10-CM | POA: Diagnosis present

## 2017-03-17 DIAGNOSIS — E1165 Type 2 diabetes mellitus with hyperglycemia: Secondary | ICD-10-CM | POA: Diagnosis present

## 2017-03-17 DIAGNOSIS — I252 Old myocardial infarction: Secondary | ICD-10-CM

## 2017-03-17 DIAGNOSIS — J9601 Acute respiratory failure with hypoxia: Secondary | ICD-10-CM

## 2017-03-17 DIAGNOSIS — L8990 Pressure ulcer of unspecified site, unspecified stage: Secondary | ICD-10-CM | POA: Insufficient documentation

## 2017-03-17 DIAGNOSIS — Z794 Long term (current) use of insulin: Secondary | ICD-10-CM

## 2017-03-17 DIAGNOSIS — F32A Depression, unspecified: Secondary | ICD-10-CM | POA: Diagnosis present

## 2017-03-17 DIAGNOSIS — Z7901 Long term (current) use of anticoagulants: Secondary | ICD-10-CM

## 2017-03-17 DIAGNOSIS — N184 Chronic kidney disease, stage 4 (severe): Secondary | ICD-10-CM | POA: Diagnosis present

## 2017-03-17 DIAGNOSIS — I5022 Chronic systolic (congestive) heart failure: Secondary | ICD-10-CM | POA: Diagnosis present

## 2017-03-17 LAB — CBC WITH DIFFERENTIAL/PLATELET
BASOS ABS: 0.1 10*3/uL (ref 0.0–0.1)
Basophils Relative: 1 %
EOS PCT: 1 %
Eosinophils Absolute: 0.2 10*3/uL (ref 0.0–0.7)
HCT: 36.9 % — ABNORMAL LOW (ref 39.0–52.0)
Hemoglobin: 11.8 g/dL — ABNORMAL LOW (ref 13.0–17.0)
Lymphocytes Relative: 30 %
Lymphs Abs: 4.4 10*3/uL — ABNORMAL HIGH (ref 0.7–4.0)
MCH: 32.4 pg (ref 26.0–34.0)
MCHC: 32 g/dL (ref 30.0–36.0)
MCV: 101.4 fL — ABNORMAL HIGH (ref 78.0–100.0)
Monocytes Absolute: 1.7 10*3/uL — ABNORMAL HIGH (ref 0.1–1.0)
Monocytes Relative: 12 %
Neutro Abs: 8.2 10*3/uL — ABNORMAL HIGH (ref 1.7–7.7)
Neutrophils Relative %: 56 %
PLATELETS: 223 10*3/uL (ref 150–400)
RBC: 3.64 MIL/uL — AB (ref 4.22–5.81)
RDW: 15.8 % — ABNORMAL HIGH (ref 11.5–15.5)
WBC: 14.6 10*3/uL — AB (ref 4.0–10.5)

## 2017-03-17 LAB — BASIC METABOLIC PANEL
Anion gap: 9 (ref 5–15)
BUN: 48 mg/dL — AB (ref 6–20)
CO2: 28 mmol/L (ref 22–32)
CREATININE: 2.04 mg/dL — AB (ref 0.61–1.24)
Calcium: 10.5 mg/dL — ABNORMAL HIGH (ref 8.9–10.3)
Chloride: 102 mmol/L (ref 101–111)
GFR, EST AFRICAN AMERICAN: 39 mL/min — AB (ref >60)
GFR, EST NON AFRICAN AMERICAN: 33 mL/min — AB (ref >60)
Glucose, Bld: 130 mg/dL — ABNORMAL HIGH (ref 65–99)
POTASSIUM: 3.6 mmol/L (ref 3.5–5.1)
SODIUM: 139 mmol/L (ref 135–145)

## 2017-03-17 LAB — BRAIN NATRIURETIC PEPTIDE: B Natriuretic Peptide: 601.2 pg/mL — ABNORMAL HIGH (ref 0.0–100.0)

## 2017-03-17 LAB — GLUCOSE, CAPILLARY: Glucose-Capillary: 125 mg/dL — ABNORMAL HIGH (ref 65–99)

## 2017-03-17 LAB — MRSA PCR SCREENING: MRSA BY PCR: POSITIVE — AB

## 2017-03-17 MED ORDER — FLUOXETINE HCL 20 MG/5ML PO SOLN
60.0000 mg | Freq: Every day | ORAL | Status: DC
  Administered 2017-03-18 – 2017-03-30 (×13): 60 mg
  Filled 2017-03-17 (×13): qty 15

## 2017-03-17 MED ORDER — SODIUM CHLORIDE 0.9 % IV SOLN: 250.0000 mL | INTRAVENOUS | Status: DC | PRN

## 2017-03-17 MED ORDER — ATORVASTATIN CALCIUM 40 MG PO TABS
40.0000 mg | ORAL_TABLET | Freq: Every day | ORAL | Status: DC
  Administered 2017-03-18: 40 mg via ORAL
  Filled 2017-03-17: qty 1

## 2017-03-17 MED ORDER — OLANZAPINE 5 MG PO TABS
5.0000 mg | ORAL_TABLET | Freq: Two times a day (BID) | ORAL | Status: DC
  Administered 2017-03-18: 5 mg via ORAL
  Filled 2017-03-17 (×2): qty 1

## 2017-03-17 MED ORDER — OXYCODONE-ACETAMINOPHEN 5-325 MG PO TABS: 1.0000 | ORAL_TABLET | Freq: Four times a day (QID) | ORAL | Status: DC | PRN

## 2017-03-17 MED ORDER — ASPIRIN 81 MG PO CHEW
81.0000 mg | CHEWABLE_TABLET | Freq: Every day | ORAL | Status: DC
  Administered 2017-03-18: 81 mg via ORAL
  Filled 2017-03-17: qty 1

## 2017-03-17 MED ORDER — TAMSULOSIN HCL 0.4 MG PO CAPS
0.4000 mg | ORAL_CAPSULE | Freq: Every day | ORAL | Status: DC
  Administered 2017-03-17: 0.4 mg via ORAL
  Filled 2017-03-17: qty 1

## 2017-03-17 MED ORDER — INSULIN ASPART 100 UNIT/ML ~~LOC~~ SOLN: 0.0000 [IU] | Freq: Every day | SUBCUTANEOUS | Status: DC

## 2017-03-17 MED ORDER — VANCOMYCIN HCL 10 G IV SOLR
2000.0000 mg | Freq: Once | INTRAVENOUS | Status: DC
  Filled 2017-03-17: qty 2000

## 2017-03-17 MED ORDER — SODIUM CHLORIDE 0.9% FLUSH
3.0000 mL | Freq: Two times a day (BID) | INTRAVENOUS | Status: DC
  Administered 2017-03-18 – 2017-03-30 (×22): 3 mL via INTRAVENOUS

## 2017-03-17 MED ORDER — WARFARIN SODIUM 6 MG PO TABS
6.0000 mg | ORAL_TABLET | Freq: Once | ORAL | Status: AC
  Administered 2017-03-18: 6 mg via ORAL
  Filled 2017-03-17: qty 1

## 2017-03-17 MED ORDER — INSULIN ASPART 100 UNIT/ML ~~LOC~~ SOLN
0.0000 [IU] | Freq: Three times a day (TID) | SUBCUTANEOUS | Status: DC
  Administered 2017-03-19: 2 [IU] via SUBCUTANEOUS
  Administered 2017-03-20: 3 [IU] via SUBCUTANEOUS
  Administered 2017-03-20: 2 [IU] via SUBCUTANEOUS

## 2017-03-17 MED ORDER — VALPROATE SODIUM 250 MG/5ML PO SOLN
250.0000 mg | Freq: Three times a day (TID) | ORAL | Status: DC
  Administered 2017-03-18 – 2017-03-30 (×38): 250 mg
  Filled 2017-03-17 (×42): qty 5

## 2017-03-17 MED ORDER — DEXTROSE 5 % IV SOLN
1.0000 g | Freq: Three times a day (TID) | INTRAVENOUS | Status: DC
  Administered 2017-03-17 – 2017-03-18 (×2): 1 g via INTRAVENOUS
  Filled 2017-03-17 (×4): qty 1

## 2017-03-17 MED ORDER — SODIUM CHLORIDE 0.9% FLUSH: 3.0000 mL | INTRAVENOUS | Status: DC | PRN

## 2017-03-17 MED ORDER — METOCLOPRAMIDE HCL 10 MG PO TABS: 5.0000 mg | ORAL_TABLET | Freq: Three times a day (TID) | ORAL | Status: DC

## 2017-03-17 MED ORDER — WARFARIN - PHARMACIST DOSING INPATIENT
Freq: Every day | Status: DC
  Administered 2017-03-25 – 2017-03-30 (×5)

## 2017-03-17 NOTE — Progress Notes (Addendum)
Pharmacy Antibiotic Note  Curtis Keller is a 61 y.o. male admitted on 03/17/2017 with pneumonia.  Pharmacy has been consulted for cefepime and vancomcyin dosing. [facility] WBC 14.6, afebrile  Plan: Vancomycin 2000mg  IV once then 750 IV every 12 hours.  Goal trough 15-20 mcg/mL. Cefepime 1G IV q8 hours  Weight: 225 lb (102.1 kg)  Temp (24hrs), Avg:97.8 F (36.6 C), Min:97.8 F (36.6 C), Max:97.8 F (36.6 C)   Recent Labs Lab 03/17/17 1413  WBC 14.6*  CREATININE 2.04*    Estimated Creatinine Clearance: 47.9 mL/min (A) (by C-G formula based on SCr of 2.04 mg/dL (H)).    Allergies  Allergen Reactions  . Codeine Anaphylaxis and Nausea And Vomiting  . Hydrocodone Other (See Comments)    On MAR  . Hydrocodone-Acetaminophen Nausea And Vomiting  . Tegaderm Ag Mesh [Silver] Other (See Comments)    On MAR  . Lisinopril Itching and Rash  . Tylenol [Acetaminophen] Nausea Only    Antimicrobials this admission: 5/25 vancomycin>>  5/25 cefepime>>  Dose adjustments this admission:   Microbiology results: 5/25 BCx: 5/25 Sputum:    Thank you for allowing pharmacy to be a part of this patient's care.  Toniann Fail Alya Smaltz 03/17/2017 5:12 PM

## 2017-03-17 NOTE — H&P (Signed)
History and Physical    Curtis Keller ZOX:[id number] DOB: 04/25/1956 DOA: 03/17/2017  PCP: Kirt Boys, DO  Patient coming from: [facility]  Chief Complaint: increased oxygen needs  HPI: Curtis Keller is a 61 y.o. male with medical history complicated past medical history as listed below. Of Note patient has had cerebral infarction and has trach as well as G-tube. I suspect that his dysphagia secondary to stroke. Patient presents with increased oxygen needs as well as increased trach secretions per my discussion with ER doctor. No other new complaints reported by patient  ED Course: Patient had chest x-ray which reported pneumonia.    Past Medical History:  Diagnosis Date  . Acute respiratory failure (HCC)    a. Spring 2017 following fall/splenectomy -->admitted to [facility]-->trach/g tube.  . Anemia    a. 12/2015 ABL in setting of fall/hematomas/splenic laceration req splenectomy.  Marland Kitchen Apical mural thrombus    a. 11/2015 Echo: EF 25-30%, moderate size apical thrombus-->coumadin;  b. 12/2015 f/u Echo: EF 25-30%, ant/septa HK, no obvious large thrombus but cannot exclude small mural thrombus.  Marland Kitchen CAD (coronary artery disease)    a. 12/2014 s/p BMS to the RCA.  Marland Kitchen Cerebral infarction (HCC)   . Chronic systolic CHF (congestive heart failure) (HCC)    a. 12/2015 Echo: EF 25-30%.  . Dementia   . Depression   . Dysphagia   . Falls    a. 11/2015 traumatic fall with resultant trauma req splenectomy and prolonged hospitalization complicated by resp failure.  Marland Kitchen History of pneumonia   . Hyperlipidemia   . Hypertension   . Ischemic cardiomyopathy    a. 12/2015 Echo: EF 25-30%, anterior and septal HK.  Marland Kitchen Left hemiplegia (HCC)   . Protein calorie malnutrition (HCC)   . Respiratory failure (HCC)   . Spleen injury   . Status post tracheostomy (HCC)    a. 01/2016 in setting of ongoing resp failure and aspiration.    Past Surgical History:  Procedure Laterality Date  . CARDIAC  CATHETERIZATION    . CORONARY ANGIOPLASTY WITH STENT PLACEMENT    . HIP ARTHROPLASTY Left 02/10/2017   Procedure: LEFT HIP HEMIARTHROPLASTY;  Surgeon: [name], MD;  Location: [facility] OR;  Service: Orthopedics;  Laterality: Left;  . IR GASTROSTOMY TUBE MOD SED  02/02/2017  . IR GENERIC HISTORICAL  07/22/2016   IR GASTROSTOMY TUBE REMOVAL 07/22/2016 [name], MD [facility]-INTERV RAD  . PR LAP, SPLENECTOMY  01/07/2016  . TRACHEOSTOMY TUBE PLACEMENT N/A 02/22/2016   Procedure: TRACHEOSTOMY;  Surgeon: [name], MD;  Location: [facility] OR;  Service: ENT;  Laterality: N/A;  . TRACHEOSTOMY TUBE PLACEMENT N/A 01/14/2017   Procedure: TRACHEOSTOMY;  Surgeon: [name], MD;  Location: [facility] OR;  Service: ENT;  Laterality: N/A;  inserted #6DCT [id number]     reports that he has quit smoking. His smoking use included Cigarettes. He has a 76.00 pack-year smoking history. He has never used smokeless tobacco. He reports that he does not drink alcohol or use drugs.  Allergies  Allergen Reactions  . Codeine Anaphylaxis and Nausea And Vomiting  . Hydrocodone Other (See Comments)    On MAR  . Hydrocodone-Acetaminophen Nausea And Vomiting  . Tegaderm Ag Mesh [Silver] Other (See Comments)    On MAR  . Lisinopril Itching and Rash  . Tylenol [Acetaminophen] Nausea Only    Family History  Problem Relation Age of Onset  . Alcohol abuse Mother   . Hypertension Mother   .  Coronary artery disease Mother   . Alcohol abuse Father   . Hypertension Father   . Coronary artery disease Father   . Alcohol abuse Maternal Uncle      Prior to Admission medications   Medication Sig Start Date End Date Taking? Authorizing Provider  Amino Acids-Protein Hydrolys (FEEDING SUPPLEMENT, PRO-STAT SUGAR FREE 64,) LIQD Place 60 mLs into feeding tube 2 (two) times daily. Pro Stat Awc 17-100 gram-kcal     [provider]  aspirin EC 81 MG tablet Give 1 tablet via G-tube one time daily    [provider]   atorvastatin (LIPITOR) 40 MG tablet Give tablet via G-Tube at bedtime for Lipids    [provider]  bacitracin ointment 500 Unit/GM - Apply to left hip topically daily for wound.  Cover with dry dressing.    [provider]  famotidine (PEPCID) 20 MG tablet Give 1 tablet via G-Tube two times a day for GERD    [provider]  FLUoxetine HCl 60 MG TABS Give 1 tablet via G-Tube daily    [provider]  folic acid (FOLVITE) 1 MG tablet Give 1 tablet via G-Tube one time daily    [provider]  guaiFENesin 200 MG tablet Give  Tablet via G-tube three times daily for CHF    [provider]  lidocaine (LIDODERM) 5 % Place 1 patch onto the skin daily. Remove & Discard patch within 12 hours or as directed by MD 12/13/16   Leroy Sea, MD  metoCLOPramide (REGLAN) 5 MG tablet Give 1 tablet via G-Tube every 8 hours for GERD for 3 days 03/17/17 03/19/17  [provider]  metoprolol succinate (TOPROL-XL) 25 MG 24 hr tablet Give 1 tablet via G-tube two times daily    [provider]  Multiple Vitamin (MULTIVITAMIN) tablet Give 1 tablet via G-tube daily    [provider]  OLANZapine (ZYPREXA) 5 MG tablet Give 1 tablet via G-tube two times daily    [provider]  Omega-3 Fatty Acids (FISH OIL PO) Give 1 capsule via G-Tube one time daily    [provider]  oxyCODONE-acetaminophen (PERCOCET/ROXICET) 5-325 MG tablet Give 1 tablet via G-tube every 8 hours for pain    [provider]  OXYGEN 28% via trach cuff every shift    [provider]  polyethylene glycol (MIRALAX / GLYCOLAX) packet Give 17 gram via G-tube one time daily for constipation    [provider]  Sennosides (SENNA LAX PO) Give 1 tablet via G-tube tow times daily for constipation    [provider]  tamsulosin (FLOMAX) 0.4 MG CAPS capsule Give 1 Capsule via G-Tube every evening    [provider]    Valproate Sodium (DEPAKENE) 250 MG/5ML SOLN solution Give 5 ml via G-Tube three times daily    [provider]  vitamin B-12 (CYANOCOBALAMIN) 1000 MCG tablet Give 1 tablet via G-tube one time daily    [provider]  warfarin (COUMADIN) 6 MG tablet 6 mg. Give 1 tablet via G-Tube in the evening for Prophylaxix    [provider]    Physical Exam: Vitals:   03/17/17 1315 03/17/17 1400 03/17/17 1445 03/17/17 1700  BP: (!) 130/91 126/82 (!) 137/93   Pulse: 98 96 97   SpO2: (!) 87% 91% 92%   Weight:    102.1 kg (225 lb)    Constitutional: NAD, calm, comfortable Vitals:   03/17/17 1315 03/17/17 1400 03/17/17 1445 03/17/17  1700  BP: (!) 130/91 126/82 (!) 137/93   Pulse: 98 96 97   SpO2: (!) 87% 91% 92%   Weight:    102.1 kg (225 lb)   Eyes: PERRL, lids and conjunctivae normal ENMT: Mucous membranes are moist. Posterior pharynx clear of any exudate or lesions. Normal dentition.  Neck: normal, supple, no masses, no thyromegaly Respiratory: rhales at bases, prolonged exp phase, no wheezes Cardiovascular: Regular rate and rhythm, no murmurs / rubs / gallops. Abdomen: no tenderness, no masses palpated. No hepatosplenomegaly. Bowel sounds positive.  Musculoskeletal: no clubbing / cyanosis.  Skin: no rashes, lesions, ulcers. No induration on limited exam Neurologic: no facial asymmetry , left sided hemiparesis Psychiatric:  Alert and oriented x 3. Normal mood.    Labs on Admission: I have personally reviewed following labs and imaging studies  CBC:  Recent Labs Lab 03/17/17 1413  WBC 14.6*  NEUTROABS 8.2*  HGB 11.8*  HCT 36.9*  MCV 101.4*  PLT 223   Basic Metabolic Panel:  Recent Labs Lab 03/17/17 1413  NA 139  K 3.6  CL 102  CO2 28  GLUCOSE 130*  BUN 48*  CREATININE 2.04*  CALCIUM 10.5*   GFR: Estimated Creatinine Clearance: 47.9 mL/min (A) (by C-G formula based on SCr of 2.04 mg/dL (H)). Liver Function Tests: No results for input(s):  AST, ALT, ALKPHOS, BILITOT, PROT, ALBUMIN in the last 168 hours. No results for input(s): LIPASE, AMYLASE in the last 168 hours. No results for input(s): AMMONIA in the last 168 hours. Coagulation Profile:  Recent Labs Lab 03/12/17 0452 03/13/17 0614 03/14/17 0833 03/15/17 0835 03/16/17 0537  INR 2.01 2.12 1.95 1.80 1.62   Cardiac Enzymes: No results for input(s): CKTOTAL, CKMB, CKMBINDEX, TROPONINI in the last 168 hours. BNP (last 3 results) No results for input(s): PROBNP in the last 8760 hours. HbA1C: No results for input(s): HGBA1C in the last 72 hours. CBG: No results for input(s): GLUCAP in the last 168 hours. Lipid Profile: No results for input(s): CHOL, HDL, LDLCALC, TRIG, CHOLHDL, LDLDIRECT in the last 72 hours. Thyroid Function Tests: No results for input(s): TSH, T4TOTAL, FREET4, T3FREE, THYROIDAB in the last 72 hours. Anemia Panel: No results for input(s): VITAMINB12, FOLATE, FERRITIN, TIBC, IRON, RETICCTPCT in the last 72 hours. Urine analysis:    Component Value Date/Time   COLORURINE YELLOW 12/21/2016 0600   APPEARANCEUR HAZY (A) 12/21/2016 0600   LABSPEC 1.021 12/21/2016 0600   PHURINE 5.0 12/21/2016 0600   GLUCOSEU NEGATIVE 12/21/2016 0600   HGBUR LARGE (A) 12/21/2016 0600   BILIRUBINUR NEGATIVE 12/21/2016 0600   KETONESUR NEGATIVE 12/21/2016 0600   PROTEINUR 100 (A) 12/21/2016 0600   NITRITE NEGATIVE 12/21/2016 0600   LEUKOCYTESUR NEGATIVE 12/21/2016 0600    Radiological Exams on Admission: Dg Chest 2 View  Result Date: 03/17/2017 CLINICAL DATA:  61 year old with chronic ventilator dependent respiratory failure presenting with cough. Current history of chronic systolic congestive heart failure, hypertension, cardiomyopathy and COPD. EXAM: CHEST  2 VIEW 3:48 p.m.: COMPARISON:  Portable chest x-ray earlier today 1:43 p.m., 03/06/2017 and earlier. FINDINGS: AP erect and lateral images were obtained. Tracheostomy tube tip in satisfactory position below the  thoracic inlet projecting approximately 5 cm above carina. Patchy airspace opacities in the lung bases, left greater than right, similar to the examination earlier today, new since 03/06/2017. Slight improvement in the interstitial pulmonary edema since earlier today. No new pulmonary parenchymal abnormalities since earlier today. IMPRESSION: 1. Tracheostomy tube tip in satisfactory position approximately 5 cm above  carina. 2. Pneumonia involving the lung bases, left greater than right. 3. Improving interstitial pulmonary edema. Electronically Signed   By: Hulan Saas M.D.   On: 03/17/2017 16:14   Dg Chest Port 1 View  Result Date: 03/17/2017 CLINICAL DATA:  Shortness of breath EXAM: PORTABLE CHEST 1 VIEW COMPARISON:  03/06/2017, 02/20/2017, 02/08/2017 FINDINGS: Tracheostomy tube tip about 2.4 cm superior to the carina and projects over the tracheal air column. Cardiomegaly with central vascular congestion and mild interstitial edema. Streaky opacity at the left base may reflect atelectasis or an infiltrate. Old right clavicular fracture and old rib fractures. IMPRESSION: Tracheostomy tube tip projects over tracheal air column and tip is about 2.4 cm superior to carina Cardiomegaly with central vascular congestion and mild diffuse edema. Streaky atelectasis or pneumonia at the left base. Electronically Signed   By: Jasmine Pang M.D.   On: 03/17/2017 14:02    Assessment/Plan Active Problems:   Pneumonia - We'll continue cefepime and vancomycin. Place pharmacy consult - obtain blood cultures    Heart failure, chronic systolic (HCC) - looks compensated. Continue home medication regimen    Depression - continue home medication regimen    Apical mural thrombus - continue coumadin    Protein calorie malnutrition (HCC) - consulted dietitian to manage tube feeds    PEG (percutaneous endoscopic gastrostomy) status (HCC) - Consulted dietitian     COPD (chronic obstructive pulmonary disease)  (HCC) - no wheezes, no need to start steroids given active infection without any wheezes    GERD (gastroesophageal reflux disease) - continue reglan    Diabetes mellitus, type II, insulin dependent (HCC)   DVT prophylaxis: coumadin Code Status: full Family Communication: none at bedside Disposition Plan: antibiotics inpt Consults called: none Admission status: inpatient   Penny Pia MD [facility] Pager [phone]  If 7PM-7AM, please contact night-coverage [email or web address] Password [facility]  03/17/2017, 5:43 PM

## 2017-03-17 NOTE — ED Notes (Addendum)
PT IS NPO.Marland KitchenMarland KitchenPER PT;S FAMILY....

## 2017-03-17 NOTE — ED Notes (Signed)
Trach care performed along with deep suctioning.

## 2017-03-17 NOTE — ED Notes (Addendum)
Very hard stick. Attempted to get blood for cultures. Waiting for phlebotomist.

## 2017-03-17 NOTE — Progress Notes (Signed)
Location:   [facility] Room Number: 126 A Place of Service:  SNF (31)   CODE STATUS: Full Code  Allergies  Allergen Reactions  . Codeine Anaphylaxis and Nausea And Vomiting  . Hydrocodone Other (See Comments)    On MAR  . Hydrocodone-Acetaminophen Nausea And Vomiting  . Tegaderm Ag Mesh [Silver] Other (See Comments)    On MAR  . Lisinopril Itching and Rash  . Tylenol [Acetaminophen] Nausea Only    Chief Complaint  Patient presents with  . Hospitalization Follow-up    Hospital follow up    HPI:  He has been hospitalized after a left hip fracture and had respiratory failure requiring trach and peg tube. He is here after a [facility].  Today his sats remain in the mid to low 80"s being on 6 liters of 02. He is going to return back to the ED for further evaluation.    Past Medical History:  Diagnosis Date  . Acute respiratory failure (HCC)    a. Spring 2017 following fall/splenectomy -->admitted to [facility]-->trach/g tube.  . Anemia    a. 12/2015 ABL in setting of fall/hematomas/splenic laceration req splenectomy.  Marland Kitchen Apical mural thrombus    a. 11/2015 Echo: EF 25-30%, moderate size apical thrombus-->coumadin;  b. 12/2015 f/u Echo: EF 25-30%, ant/septa HK, no obvious large thrombus but cannot exclude small mural thrombus.  Marland Kitchen CAD (coronary artery disease)    a. 12/2014 s/p BMS to the RCA.  Marland Kitchen Cerebral infarction (HCC)   . Chronic systolic CHF (congestive heart failure) (HCC)    a. 12/2015 Echo: EF 25-30%.  . Dementia   . Depression   . Dysphagia   . Falls    a. 11/2015 traumatic fall with resultant trauma req splenectomy and prolonged hospitalization complicated by resp failure.  Marland Kitchen History of pneumonia   . Hyperlipidemia   . Hypertension   . Ischemic cardiomyopathy    a. 12/2015 Echo: EF 25-30%, anterior and septal HK.  Marland Kitchen Left hemiplegia (HCC)   . Protein calorie malnutrition (HCC)   . Respiratory failure (HCC)    . Spleen injury   . Status post tracheostomy (HCC)    a. 01/2016 in setting of ongoing resp failure and aspiration.    Past Surgical History:  Procedure Laterality Date  . CARDIAC CATHETERIZATION    . CORONARY ANGIOPLASTY WITH STENT PLACEMENT    . HIP ARTHROPLASTY Left 02/10/2017   Procedure: LEFT HIP HEMIARTHROPLASTY;  Surgeon: [name], MD;  Location: [facility] OR;  Service: Orthopedics;  Laterality: Left;  . IR GASTROSTOMY TUBE MOD SED  02/02/2017  . IR GENERIC HISTORICAL  07/22/2016   IR GASTROSTOMY TUBE REMOVAL 07/22/2016 [name], MD [facility]-INTERV RAD  . PR LAP, SPLENECTOMY  01/07/2016  . TRACHEOSTOMY TUBE PLACEMENT N/A 02/22/2016   Procedure: TRACHEOSTOMY;  Surgeon: [name], MD;  Location: [facility] OR;  Service: ENT;  Laterality: N/A;  . TRACHEOSTOMY TUBE PLACEMENT N/A 01/14/2017   Procedure: TRACHEOSTOMY;  Surgeon: [name], MD;  Location: [facility] OR;  Service: ENT;  Laterality: N/A;  inserted #6DCT [id number]#[id number]    Social History   Social History  . Marital status: Single    Spouse name: N/A  . Number of children: N/A  . Years of education: N/A   Occupational History  . Not on file.   Social History Main Topics  . Smoking status: Former Smoker    Packs/day: 2.00    Years: 38.00    Types:  Cigarettes  . Smokeless tobacco: Never Used     Comment: quit spring of 2017.  Marland Kitchen Alcohol use No  . Drug use: No  . Sexual activity: Not on file   Other Topics Concern  . Not on file   Social History Narrative   Lives @ SNF since Spring 2017.  Sedentary.  Unsteady on his feet.  Falls about 1x/wk.   Family History  Problem Relation Age of Onset  . Alcohol abuse Mother   . Hypertension Mother   . Coronary artery disease Mother   . Alcohol abuse Father   . Hypertension Father   . Coronary artery disease Father   . Alcohol abuse Maternal Uncle       VITAL SIGNS BP 138/86   Pulse 66   Temp 97.8 F (36.6 C)   Resp 18   Ht 6\' 2"  (1.88 m)   SpO2 (!) 84%  Comment: trach collar 6 liters 02  Patient's Medications  New Prescriptions   No medications on file  Previous Medications   AMINO ACIDS-PROTEIN HYDROLYS (FEEDING SUPPLEMENT, PRO-STAT SUGAR FREE 64,) LIQD    Place 60 mLs into feeding tube 2 (two) times daily. Pro Stat Awc 17-100 gram-kcal    ASPIRIN EC 81 MG TABLET    Give 1 tablet via G-tube one time daily   ATORVASTATIN (LIPITOR) 40 MG TABLET    Give tablet via G-Tube at bedtime for Lipids   BACITRACIN OINTMENT    500 Unit/GM - Apply to left hip topically daily for wound.  Cover with dry dressing.   FAMOTIDINE (PEPCID) 20 MG TABLET    Give 1 tablet via G-Tube two times a day for GERD   FLUOXETINE HCL 60 MG TABS    Give 1 tablet via G-Tube daily   FOLIC ACID (FOLVITE) 1 MG TABLET    Give 1 tablet via G-Tube one time daily   GUAIFENESIN 200 MG TABLET    Give  Tablet via G-tube three times daily for CHF   LIDOCAINE (LIDODERM) 5 %    Place 1 patch onto the skin daily. Remove & Discard patch within 12 hours or as directed by MD   METOCLOPRAMIDE (REGLAN) 5 MG TABLET    Give 1 tablet via G-Tube every 8 hours for GERD for 3 days   METOPROLOL SUCCINATE (TOPROL-XL) 25 MG 24 HR TABLET    Give 1 tablet via G-tube two times daily   MULTIPLE VITAMIN (MULTIVITAMIN) TABLET    Give 1 tablet via G-tube daily   OLANZAPINE (ZYPREXA) 5 MG TABLET    Give 1 tablet via G-tube two times daily   OMEGA-3 FATTY ACIDS (FISH OIL PO)    Give 1 capsule via G-Tube one time daily   OXYCODONE-ACETAMINOPHEN (PERCOCET/ROXICET) 5-325 MG TABLET    Give 1 tablet via G-tube every 8 hours for pain   OXYGEN    28% via trach cuff every shift   POLYETHYLENE GLYCOL (MIRALAX / GLYCOLAX) PACKET    Give 17 gram via G-tube one time daily for constipation   SENNOSIDES (SENNA LAX PO)    Give 1 tablet via G-tube tow times daily for constipation   TAMSULOSIN (FLOMAX) 0.4 MG CAPS CAPSULE    Give 1 Capsule via G-Tube every evening   VALPROATE SODIUM (DEPAKENE) 250 MG/5ML SOLN SOLUTION    Give 5  ml via G-Tube three times daily   VITAMIN B-12 (CYANOCOBALAMIN) 1000 MCG TABLET    Give 1 tablet via G-tube one time daily   WARFARIN (  COUMADIN) 6 MG TABLET    6 mg. Give 1 tablet via G-Tube in the evening for Prophylaxix  Modified Medications   No medications on file  Discontinued Medications     SIGNIFICANT DIAGNOSTIC EXAMS   02-24-16: chest x-ray: 1. Tracheostomy tube tip in satisfactory position approximately 5 cm above the carina. 2. Significant reduction in pneumomediastinum and subcutaneous emphysema since the examination 2 days ago. 3. Improved interstitial pulmonary edema, though mild interstitial edema persists. 4. New bibasilar atelectasis and new dense atelectasis and/or pneumonia in the right lower lobe.  04-04-16: right hip and pelvic x-ray: modest osteoarthritis no pelvic or hip fracture   04-20-16: left knee x-ray; mild osteoarthritis   07-06-16: 2-d echo: 1. technically difficult; 2. Normal left ventricular systolic function EF 55-60%; 3. Mild relaxation abnormality 4. Mild concentric left ventricular hypertrophy 5. Mild sclerotic aortic valve no significant stenosis 6. Trace tricuspid regurgitation . 7. Cannot rule out thrombus secondary to poor quality   07-11-16: chest x-ray; no acute cardiopulmonary process   LABS REVIEWED:   03-08-16: wbc 8.9; gb 12.8; hct 39.0; mcv 98.5 plt 329; glucose 196; bun 32; creat 0.99; k+ 3.6; na++ 135  03-15-16: wbc 9.7; hb 11.4; hct 35.9; mcv 88.2; plt 303; glucose 155; bun 18.1; creat 0.95; k+ 4.3; na++ 137 03-18-16: inr 2.3 03-22-16: inr 2.7: coumadin 5 mg daily  05-18-16: INR 2.7  05-24-16: chol 105; ldl 40; trig 86; hdl 47 urine micro-albumin 9.7 06-21-16: INR 2.3   07-19-16: wbc 8.5; hgb 12.5; hct 40.4; mcv 98.8; plt 233; glucose 70; bun 29.5; creat 1.27; k+ 4.2; na++ 141; INR 2.9  08-21-16: INR 4.2  10-05-16: tsh 2.62; vit B 12: 264; folate 17.8     Review of Systems  Unable to perform ROS: other    Physical Exam  Constitutional:  No distress.  Eyes: Conjunctivae are normal.  Neck: Neck supple. No JVD present. No thyromegaly present.  Cardiovascular: wheezing present 02 6L via collar  has trach  Respiratory: Effort normal and breath sounds normal. No respiratory distress. He has no wheezes.  GI: Soft. Bowel sounds are normal. He exhibits no distension. There is no tenderness.  Has peg tube  Musculoskeletal: He exhibits no edema.  Left hand in splint left knee contracture  Lymphadenopathy:    He has no cervical adenopathy.  Neurological: He is alert.  Skin: Skin is warm and dry. He is not diaphoretic.  Psychiatric: He has a normal mood and affect.    ASSESSMENT/ PLAN:  Acute on chronic respiratory failure with hypoxia is trach dependent: the nursing staff did suction a large mucous plug without improvement of his 02 sats Will send to the ED for further evaluation and treatment options.    Time spent with patient  50  minutes >50% time spent counseling; reviewing medical record; tests; labs; and developing future plan of care    MD is aware of resident's narcotic use and is in agreement with current plan of care. We will attempt to wean resident as apropriate   Synthia Innocent NP [facility]  Contact [phone] Monday through Friday 8am- 5pm  After hours call [phone]

## 2017-03-17 NOTE — ED Notes (Signed)
Attempted to give report RN in pt room; ED RN to call back in a few minutes

## 2017-03-17 NOTE — ED Notes (Signed)
Respiratory at bedside.

## 2017-03-17 NOTE — ED Provider Notes (Signed)
[facility]-EMERGENCY DEPT Provider Note   CSN: [id number] Arrival date & time: 03/17/17  1221     History   Chief Complaint Chief Complaint  Patient presents with  . Cough    HPI Curtis Keller is a 61 y.o. male.  Patient is a 61 year old male who was sent from intervention facility for hypoxia. He has a history of CHF, coronary disease, hypertension and hyperlipidemia with a prior stroke and left-sided hemiparesis. with recent admission to [facility]. He is trach dependent. He was discharged from [facility] to start on nursing facility yesterday. According to the patient's sister, he was receiving regular suctioning from [facility] as well as oxygen by tracheal cuff at 24- 28%.  She noted this morning at the nursing home the patient was not on his oxygen.  They also note he hasn't been suctioned all night. Patient denies any current complaints. He denies any increase shortness of breath. He denies any increased leg swelling. No chest pain. No fevers. No increased coughing. The nursing facility noticed that his oxygen saturations were in the low 80s this morning which is the reason he was sent over here for evaluation.      Past Medical History:  Diagnosis Date  . Acute respiratory failure (HCC)    a. Spring 2017 following fall/splenectomy -->admitted to [facility]-->trach/g tube.  . Anemia    a. 12/2015 ABL in setting of fall/hematomas/splenic laceration req splenectomy.  Marland Kitchen Apical mural thrombus    a. 11/2015 Echo: EF 25-30%, moderate size apical thrombus-->coumadin;  b. 12/2015 f/u Echo: EF 25-30%, ant/septa HK, no obvious large thrombus but cannot exclude small mural thrombus.  Marland Kitchen CAD (coronary artery disease)    a. 12/2014 s/p BMS to the RCA.  Marland Kitchen Cerebral infarction (HCC)   . Chronic systolic CHF (congestive heart failure) (HCC)    a. 12/2015 Echo: EF 25-30%.  . Dementia   . Depression   . Dysphagia   . Falls    a. 11/2015 traumatic fall  with resultant trauma req splenectomy and prolonged hospitalization complicated by resp failure.  Marland Kitchen History of pneumonia   . Hyperlipidemia   . Hypertension   . Ischemic cardiomyopathy    a. 12/2015 Echo: EF 25-30%, anterior and septal HK.  Marland Kitchen Left hemiplegia (HCC)   . Protein calorie malnutrition (HCC)   . Respiratory failure (HCC)   . Spleen injury   . Status post tracheostomy (HCC)    a. 01/2016 in setting of ongoing resp failure and aspiration.    Patient Active Problem List   Diagnosis Date Noted  . 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 Depression with anxiety 01/23/2017  . Encounter for hospice care discussion   . DNR (do not resuscitate)   . Ischemic cardiomyopathy   . Goals of care, counseling/discussion   . Palliative care encounter   . Acute systolic congestive heart failure (HCC)   . Acute and chronic respiratory failure with hypoxia (HCC) 11/28/2016  . Hip fracture (HCC) 11/27/2016  . Closed nondisplaced intertrochanteric fracture of left femur (HCC) 11/27/2016  . Chronic respiratory failure (HCC) 11/27/2016  . Fall at nursing home 07/17/2016  . Diabetes mellitus, type II, insulin dependent (HCC) 06/12/2016  . GERD (gastroesophageal reflux disease) 05/07/2016  . COPD (chronic obstructive pulmonary disease) (HCC) 04/18/2016  . Chronic anticoagulation 03/22/2016  . S/P splenectomy 03/13/2016  . PEG (percutaneous endoscopic gastrostomy) status (HCC) 03/13/2016  . MRSA (methicillin resistant Staphylococcus aureus) septicemia (HCC)  03/13/2016  . Aspiration pneumonia (HCC) 03/13/2016  . Dysphagia 03/13/2016  . Acute blood loss anemia 03/13/2016  . Hyperlipidemia 03/13/2016  . Heart failure, chronic systolic (HCC)   . Hypertension   . Depression   . Status post tracheostomy (HCC)   . Cardiomyopathy (HCC)   . Apical mural thrombus   . Protein calorie malnutrition (HCC)   . Intraparenchymal hematoma of brain due to trauma [facility])     Past  Surgical History:  Procedure Laterality Date  . CARDIAC CATHETERIZATION    . CORONARY ANGIOPLASTY WITH STENT PLACEMENT    . HIP ARTHROPLASTY Left 02/10/2017   Procedure: LEFT HIP HEMIARTHROPLASTY;  Surgeon: [name], MD;  Location: [facility] OR;  Service: Orthopedics;  Laterality: Left;  . IR GASTROSTOMY TUBE MOD SED  02/02/2017  . IR GENERIC HISTORICAL  07/22/2016   IR GASTROSTOMY TUBE REMOVAL 07/22/2016 [name], MD [facility]-INTERV RAD  . PR LAP, SPLENECTOMY  01/07/2016  . TRACHEOSTOMY TUBE PLACEMENT N/A 02/22/2016   Procedure: TRACHEOSTOMY;  Surgeon: [name], MD;  Location: [facility] OR;  Service: ENT;  Laterality: N/A;  . TRACHEOSTOMY TUBE PLACEMENT N/A 01/14/2017   Procedure: TRACHEOSTOMY;  Surgeon: [name], MD;  Location: [facility] OR;  Service: ENT;  Laterality: N/A;  inserted #[id number]       Home Medications    Prior to Admission medications   Medication Sig Start Date End Date Taking? Authorizing Provider  Amino Acids-Protein Hydrolys (FEEDING SUPPLEMENT, PRO-STAT SUGAR FREE 64,) LIQD Place 60 mLs into feeding tube 2 (two) times daily. Pro Stat Awc 17-100 gram-kcal     [provider]  aspirin EC 81 MG tablet Give 1 tablet via G-tube one time daily    [provider]  atorvastatin (LIPITOR) 40 MG tablet Give tablet via G-Tube at bedtime for Lipids    [provider]  bacitracin ointment 500 Unit/GM - Apply to left hip topically daily for wound.  Cover with dry dressing.    [provider]  famotidine (PEPCID) 20 MG tablet Give 1 tablet via G-Tube two times a day for GERD    [provider]  FLUoxetine HCl 60 MG TABS Give 1 tablet via G-Tube daily    [provider]  folic acid (FOLVITE) 1 MG tablet Give 1 tablet via G-Tube one time daily    [provider]  guaiFENesin 200 MG tablet Give  Tablet via G-tube three times daily for CHF    [provider]  lidocaine (LIDODERM) 5 % Place 1 patch onto  the skin daily. Remove & Discard patch within 12 hours or as directed by MD 12/13/16   Leroy Sea, MD  metoCLOPramide (REGLAN) 5 MG tablet Give 1 tablet via G-Tube every 8 hours for GERD for 3 days 03/17/17 03/19/17  [provider]  metoprolol succinate (TOPROL-XL) 25 MG 24 hr tablet Give 1 tablet via G-tube two times daily    [provider]  Multiple Vitamin (MULTIVITAMIN) tablet Give 1 tablet via G-tube daily    [provider]  OLANZapine (ZYPREXA) 5 MG tablet Give 1 tablet via G-tube two times daily    [provider]  Omega-3 Fatty Acids (FISH OIL PO) Give 1 capsule via G-Tube one time daily    [provider]  oxyCODONE-acetaminophen (PERCOCET/ROXICET) 5-325 MG tablet Give 1 tablet via G-tube every 8 hours for pain    [provider]  OXYGEN 28% via trach cuff every shift    [provider]  polyethylene glycol (MIRALAX / GLYCOLAX) packet Give 17 gram via G-tube one time daily for constipation    [provider]  Sennosides (SENNA LAX PO) Give 1 tablet via G-tube tow times daily for constipation    [provider]  tamsulosin (FLOMAX) 0.4 MG CAPS capsule Give 1 Capsule via G-Tube every evening    [provider]  Valproate Sodium (DEPAKENE) 250 MG/5ML SOLN solution Give 5 ml via G-Tube three times daily    [provider]  vitamin B-12 (CYANOCOBALAMIN) 1000 MCG tablet Give 1 tablet via G-tube one time daily    [provider]  warfarin (COUMADIN) 6 MG tablet 6 mg. Give 1 tablet via G-Tube in the evening for Prophylaxix    [provider]    Family History Family History  Problem Relation Age of Onset  . Alcohol abuse Mother   . Hypertension Mother   . Coronary artery disease Mother   . Alcohol abuse Father   . Hypertension Father   . Coronary artery disease Father   . Alcohol abuse Maternal Uncle     Social History Social History  Substance Use Topics  .  Smoking status: Former Smoker    Packs/day: 2.00    Years: 38.00    Types: Cigarettes  . Smokeless tobacco: Never Used     Comment: quit spring of 2017.  Marland Kitchen Alcohol use No     Allergies   Codeine; Hydrocodone; Hydrocodone-acetaminophen; Tegaderm ag mesh [silver]; Lisinopril; and Tylenol [acetaminophen]   Review of Systems Review of Systems  Constitutional: Negative for chills, diaphoresis, fatigue and fever.  HENT: Negative for congestion, rhinorrhea and sneezing.   Eyes: Negative.   Respiratory: Positive for cough. Negative for chest tightness and shortness of breath.   Cardiovascular: Negative for chest pain and leg swelling.  Gastrointestinal: Negative for abdominal pain, blood in stool, diarrhea, nausea and vomiting.  Genitourinary: Negative for difficulty urinating, flank pain, frequency and hematuria.  Musculoskeletal: Negative for arthralgias and back pain.  Skin: Negative for rash.  Neurological: Negative for dizziness, speech difficulty, weakness, numbness and headaches.     Physical Exam Updated Vital Signs BP (!) 137/93   Pulse 97   SpO2 92%   Physical Exam  Constitutional: He is oriented to person, place, and time. He appears well-developed and well-nourished.  HENT:  Head: Normocephalic and atraumatic.  Eyes: Pupils are equal, round, and reactive to light.  Neck: Normal range of motion. Neck supple.  Cardiovascular: Normal rate, regular rhythm and normal heart sounds.   Pulmonary/Chest: Effort normal and breath sounds normal. No respiratory distress. He has no wheezes. He has no rales. He exhibits no tenderness.  Rhonchi bilaterally with thick yellow tracheal secretions  Abdominal: Soft. Bowel sounds are normal. There is no tenderness. There is no rebound and no guarding.  Musculoskeletal: Normal range of motion. He exhibits no edema.  Lymphadenopathy:    He has no cervical adenopathy.  Neurological: He is alert and oriented to person, place, and time.    Skin: Skin is warm and dry. No rash noted.  Psychiatric: He has a normal mood and affect.     ED Treatments / Results  Labs (all labs ordered are listed, but only abnormal results are displayed) Labs Reviewed  BASIC METABOLIC PANEL - Abnormal; Notable for the following:       Result Value   Glucose, Bld 130 (*)    BUN 48 (*)    Creatinine, Ser 2.04 (*)  Calcium 10.5 (*)    GFR calc non Af Amer 33 (*)    GFR calc Af Amer 39 (*)    All other components within normal limits  CBC WITH DIFFERENTIAL/PLATELET - Abnormal; Notable for the following:    WBC 14.6 (*)    RBC 3.64 (*)    Hemoglobin 11.8 (*)    HCT 36.9 (*)    MCV 101.4 (*)    RDW 15.8 (*)    Neutro Abs 8.2 (*)    Lymphs Abs 4.4 (*)    Monocytes Absolute 1.7 (*)    All other components within normal limits  BRAIN NATRIURETIC PEPTIDE - Abnormal; Notable for the following:    B Natriuretic Peptide 601.2 (*)    All other components within normal limits    EKG  EKG Interpretation None       Radiology Dg Chest 2 View  Result Date: 03/17/2017 CLINICAL DATA:  61 year old with chronic ventilator dependent respiratory failure presenting with cough. Current history of chronic systolic congestive heart failure, hypertension, cardiomyopathy and COPD. EXAM: CHEST  2 VIEW 3:48 p.m.: COMPARISON:  Portable chest x-ray earlier today 1:43 p.m., 03/06/2017 and earlier. FINDINGS: AP erect and lateral images were obtained. Tracheostomy tube tip in satisfactory position below the thoracic inlet projecting approximately 5 cm above carina. Patchy airspace opacities in the lung bases, left greater than right, similar to the examination earlier today, new since 03/06/2017. Slight improvement in the interstitial pulmonary edema since earlier today. No new pulmonary parenchymal abnormalities since earlier today. IMPRESSION: 1. Tracheostomy tube tip in satisfactory position approximately 5 cm above carina. 2. Pneumonia involving the lung bases,  left greater than right. 3. Improving interstitial pulmonary edema. Electronically Signed   By: Hulan Saas M.D.   On: 03/17/2017 16:14   Dg Chest Port 1 View  Result Date: 03/17/2017 CLINICAL DATA:  Shortness of breath EXAM: PORTABLE CHEST 1 VIEW COMPARISON:  03/06/2017, 02/20/2017, 02/08/2017 FINDINGS: Tracheostomy tube tip about 2.4 cm superior to the carina and projects over the tracheal air column. Cardiomegaly with central vascular congestion and mild interstitial edema. Streaky opacity at the left base may reflect atelectasis or an infiltrate. Old right clavicular fracture and old rib fractures. IMPRESSION: Tracheostomy tube tip projects over tracheal air column and tip is about 2.4 cm superior to carina Cardiomegaly with central vascular congestion and mild diffuse edema. Streaky atelectasis or pneumonia at the left base. Electronically Signed   By: Jasmine Pang M.D.   On: 03/17/2017 14:02    Procedures Procedures (including critical care time)  Medications Ordered in ED Medications  ceFEPIme (MAXIPIME) 1 g in dextrose 5 % 50 mL IVPB (not administered)     Initial Impression / Assessment and Plan / ED Course  I have reviewed the triage vital signs and the nursing notes.  Pertinent labs & imaging results that were available during my care of the patient were reviewed by me and considered in my medical decision making (see chart for details).     Patient was suctioned and he is doing better. However he still has an increased oxygen requirement. He's on 6-8 L/m of oxygen and his sats are in the mid 90s. He doesn't seem to have any increased work of breathing. His blood pressure is stable. He does however have bilateral lower lobe infiltrates on x-ray. Given this, I did give him IV antibiotics to treat him for healthcare associated pneumonia. I consulted with Dr. Cena Benton with the triad hospitalist service who admit the  patient.  Final Clinical Impressions(s) / ED Diagnoses   Final  diagnoses:  HCAP (healthcare-associated pneumonia)    New Prescriptions New Prescriptions   No medications on file     Rolan Bucco, MD 03/17/17 1649

## 2017-03-17 NOTE — Progress Notes (Signed)
ANTICOAGULATION CONSULT NOTE  Pharmacy Consult for warfarin Indication: history of mural apical thrombus  Allergies  Allergen Reactions  . Codeine Anaphylaxis and Nausea And Vomiting  . Lisinopril Itching and Rash  . Hydrocodone Other (See Comments)    On MAR  . Hydrocodone-Acetaminophen Nausea And Vomiting    Cannot take any acetaminophen due to liver issues per sister  . Tegaderm Ag Mesh [Silver] Other (See Comments)    On MAR  . Tylenol [Acetaminophen] Nausea Only and Other (See Comments)    Cannot take any acetaminophen due to liver issues per sister    Patient Measurements: Weight: 225 lb (102.1 kg)  Vital Signs: Temp: 97.8 F (36.6 C) (05/25 1051) BP: 119/76 (05/25 1915) Pulse Rate: 111 (05/25 2010)  Labs:  Recent Labs  03/15/17 0835 03/16/17 0537 03/17/17 1413  HGB  --   --  11.8*  HCT  --   --  36.9*  PLT  --   --  223  LABPROT 21.1* 19.4*  --   INR 1.80 1.62  --   CREATININE  --   --  2.04*    Estimated Creatinine Clearance: 47.9 mL/min (A) (by C-G formula based on SCr of 2.04 mg/dL (H)).   Medical History: Past Medical History:  Diagnosis Date  . Acute respiratory failure (HCC)    a. Spring 2017 following fall/splenectomy -->admitted to [facility]-->trach/g tube.  . Anemia    a. 12/2015 ABL in setting of fall/hematomas/splenic laceration req splenectomy.  Marland Kitchen Apical mural thrombus    a. 11/2015 Echo: EF 25-30%, moderate size apical thrombus-->coumadin;  b. 12/2015 f/u Echo: EF 25-30%, ant/septa HK, no obvious large thrombus but cannot exclude small mural thrombus.  Marland Kitchen CAD (coronary artery disease)    a. 12/2014 s/p BMS to the RCA.  Marland Kitchen Cerebral infarction (HCC)   . Chronic systolic CHF (congestive heart failure) (HCC)    a. 12/2015 Echo: EF 25-30%.  . Dementia   . Depression   . Dysphagia   . Falls    a. 11/2015 traumatic fall with resultant trauma req splenectomy and prolonged hospitalization complicated by resp failure.  Marland Kitchen History of  pneumonia   . Hyperlipidemia   . Hypertension   . Ischemic cardiomyopathy    a. 12/2015 Echo: EF 25-30%, anterior and septal HK.  Marland Kitchen Left hemiplegia (HCC)   . Protein calorie malnutrition (HCC)   . Respiratory failure (HCC)   . Spleen injury   . Status post tracheostomy (HCC)    a. 01/2016 in setting of ongoing resp failure and aspiration.   Assessment: 61 year old male with complicated medical history of cerebral infarction with trach and g-tube. History of apical thrombus on chronic coumadin. No INR today with late admission, but INR yesterday was 1.6 and per nursing home MAR dose was not charted last night. CBC is stable from previous admits.  Goal of Therapy:  INR 2-3 Monitor platelets by anticoagulation protocol: Yes   Plan:  Warfarin 6mg  tonight Daily INR  Sheppard Coil PharmD., BCPS Clinical Pharmacist Pager [phone] 03/17/2017 9:12 PM

## 2017-03-17 NOTE — ED Triage Notes (Signed)
Pt arrives from [place] via [facility]. Pt was transferred to this facility yesterday from [facility] and wife states he hadn't been suctioned since his arrival. Wife also states he wasn't on any oxygen and slept with his valve open. Pt is normally at 28% trach collar O2. After increasing oxygen pt is now stabilized. Facility called EMS rt hypoxia.

## 2017-03-18 DIAGNOSIS — J410 Simple chronic bronchitis: Secondary | ICD-10-CM | POA: Diagnosis not present

## 2017-03-18 DIAGNOSIS — I472 Ventricular tachycardia: Secondary | ICD-10-CM | POA: Diagnosis not present

## 2017-03-18 DIAGNOSIS — J189 Pneumonia, unspecified organism: Secondary | ICD-10-CM | POA: Diagnosis not present

## 2017-03-18 DIAGNOSIS — I513 Intracardiac thrombosis, not elsewhere classified: Secondary | ICD-10-CM | POA: Diagnosis not present

## 2017-03-18 DIAGNOSIS — R0902 Hypoxemia: Secondary | ICD-10-CM | POA: Diagnosis not present

## 2017-03-18 DIAGNOSIS — I5043 Acute on chronic combined systolic (congestive) and diastolic (congestive) heart failure: Secondary | ICD-10-CM | POA: Diagnosis present

## 2017-03-18 DIAGNOSIS — R609 Edema, unspecified: Secondary | ICD-10-CM | POA: Diagnosis not present

## 2017-03-18 DIAGNOSIS — J9600 Acute respiratory failure, unspecified whether with hypoxia or hypercapnia: Secondary | ICD-10-CM | POA: Diagnosis not present

## 2017-03-18 DIAGNOSIS — I69354 Hemiplegia and hemiparesis following cerebral infarction affecting left non-dominant side: Secondary | ICD-10-CM | POA: Diagnosis not present

## 2017-03-18 DIAGNOSIS — I5040 Unspecified combined systolic (congestive) and diastolic (congestive) heart failure: Secondary | ICD-10-CM | POA: Diagnosis not present

## 2017-03-18 DIAGNOSIS — R296 Repeated falls: Secondary | ICD-10-CM | POA: Diagnosis present

## 2017-03-18 DIAGNOSIS — I252 Old myocardial infarction: Secondary | ICD-10-CM | POA: Diagnosis not present

## 2017-03-18 DIAGNOSIS — J449 Chronic obstructive pulmonary disease, unspecified: Secondary | ICD-10-CM | POA: Diagnosis present

## 2017-03-18 DIAGNOSIS — Z955 Presence of coronary angioplasty implant and graft: Secondary | ICD-10-CM | POA: Diagnosis not present

## 2017-03-18 DIAGNOSIS — R1310 Dysphagia, unspecified: Secondary | ICD-10-CM | POA: Diagnosis present

## 2017-03-18 DIAGNOSIS — F320 Major depressive disorder, single episode, mild: Secondary | ICD-10-CM | POA: Diagnosis not present

## 2017-03-18 DIAGNOSIS — J411 Mucopurulent chronic bronchitis: Secondary | ICD-10-CM | POA: Diagnosis not present

## 2017-03-18 DIAGNOSIS — J9621 Acute and chronic respiratory failure with hypoxia: Secondary | ICD-10-CM | POA: Diagnosis not present

## 2017-03-18 DIAGNOSIS — Z931 Gastrostomy status: Secondary | ICD-10-CM | POA: Diagnosis not present

## 2017-03-18 DIAGNOSIS — I509 Heart failure, unspecified: Secondary | ICD-10-CM | POA: Diagnosis not present

## 2017-03-18 DIAGNOSIS — E119 Type 2 diabetes mellitus without complications: Secondary | ICD-10-CM | POA: Diagnosis not present

## 2017-03-18 DIAGNOSIS — N179 Acute kidney failure, unspecified: Secondary | ICD-10-CM | POA: Diagnosis not present

## 2017-03-18 DIAGNOSIS — F0390 Unspecified dementia without behavioral disturbance: Secondary | ICD-10-CM | POA: Diagnosis present

## 2017-03-18 DIAGNOSIS — Z431 Encounter for attention to gastrostomy: Secondary | ICD-10-CM | POA: Diagnosis not present

## 2017-03-18 DIAGNOSIS — F329 Major depressive disorder, single episode, unspecified: Secondary | ICD-10-CM | POA: Diagnosis present

## 2017-03-18 DIAGNOSIS — L89159 Pressure ulcer of sacral region, unspecified stage: Secondary | ICD-10-CM | POA: Diagnosis not present

## 2017-03-18 DIAGNOSIS — N184 Chronic kidney disease, stage 4 (severe): Secondary | ICD-10-CM | POA: Diagnosis present

## 2017-03-18 DIAGNOSIS — I130 Hypertensive heart and chronic kidney disease with heart failure and stage 1 through stage 4 chronic kidney disease, or unspecified chronic kidney disease: Secondary | ICD-10-CM | POA: Diagnosis present

## 2017-03-18 DIAGNOSIS — Z930 Tracheostomy status: Secondary | ICD-10-CM | POA: Diagnosis not present

## 2017-03-18 DIAGNOSIS — R911 Solitary pulmonary nodule: Secondary | ICD-10-CM | POA: Diagnosis not present

## 2017-03-18 DIAGNOSIS — I340 Nonrheumatic mitral (valve) insufficiency: Secondary | ICD-10-CM | POA: Diagnosis present

## 2017-03-18 DIAGNOSIS — E7800 Pure hypercholesterolemia, unspecified: Secondary | ICD-10-CM | POA: Diagnosis present

## 2017-03-18 DIAGNOSIS — I5022 Chronic systolic (congestive) heart failure: Secondary | ICD-10-CM | POA: Diagnosis not present

## 2017-03-18 DIAGNOSIS — I236 Thrombosis of atrium, auricular appendage, and ventricle as current complications following acute myocardial infarction: Secondary | ICD-10-CM | POA: Diagnosis not present

## 2017-03-18 DIAGNOSIS — E876 Hypokalemia: Secondary | ICD-10-CM | POA: Diagnosis present

## 2017-03-18 DIAGNOSIS — R0602 Shortness of breath: Secondary | ICD-10-CM | POA: Diagnosis not present

## 2017-03-18 DIAGNOSIS — I5023 Acute on chronic systolic (congestive) heart failure: Secondary | ICD-10-CM | POA: Diagnosis not present

## 2017-03-18 DIAGNOSIS — J690 Pneumonitis due to inhalation of food and vomit: Secondary | ICD-10-CM | POA: Diagnosis not present

## 2017-03-18 DIAGNOSIS — Z794 Long term (current) use of insulin: Secondary | ICD-10-CM | POA: Diagnosis not present

## 2017-03-18 DIAGNOSIS — E46 Unspecified protein-calorie malnutrition: Secondary | ICD-10-CM | POA: Diagnosis present

## 2017-03-18 DIAGNOSIS — Z9981 Dependence on supplemental oxygen: Secondary | ICD-10-CM | POA: Diagnosis not present

## 2017-03-18 DIAGNOSIS — I2510 Atherosclerotic heart disease of native coronary artery without angina pectoris: Secondary | ICD-10-CM | POA: Diagnosis present

## 2017-03-18 DIAGNOSIS — I255 Ischemic cardiomyopathy: Secondary | ICD-10-CM | POA: Diagnosis present

## 2017-03-18 DIAGNOSIS — K219 Gastro-esophageal reflux disease without esophagitis: Secondary | ICD-10-CM | POA: Diagnosis not present

## 2017-03-18 DIAGNOSIS — J9620 Acute and chronic respiratory failure, unspecified whether with hypoxia or hypercapnia: Secondary | ICD-10-CM | POA: Diagnosis not present

## 2017-03-18 DIAGNOSIS — I517 Cardiomegaly: Secondary | ICD-10-CM | POA: Diagnosis not present

## 2017-03-18 DIAGNOSIS — N189 Chronic kidney disease, unspecified: Secondary | ICD-10-CM | POA: Diagnosis not present

## 2017-03-18 LAB — CBC
HCT: 32 % — ABNORMAL LOW (ref 39.0–52.0)
HEMOGLOBIN: 9.9 g/dL — AB (ref 13.0–17.0)
MCH: 31.9 pg (ref 26.0–34.0)
MCHC: 30.9 g/dL (ref 30.0–36.0)
MCV: 103.2 fL — AB (ref 78.0–100.0)
Platelets: 224 10*3/uL (ref 150–400)
RBC: 3.1 MIL/uL — ABNORMAL LOW (ref 4.22–5.81)
RDW: 16.2 % — ABNORMAL HIGH (ref 11.5–15.5)
WBC: 15.4 10*3/uL — ABNORMAL HIGH (ref 4.0–10.5)

## 2017-03-18 LAB — BASIC METABOLIC PANEL
ANION GAP: 7 (ref 5–15)
BUN: 45 mg/dL — ABNORMAL HIGH (ref 6–20)
CALCIUM: 9.9 mg/dL (ref 8.9–10.3)
CHLORIDE: 107 mmol/L (ref 101–111)
CO2: 27 mmol/L (ref 22–32)
Creatinine, Ser: 2.06 mg/dL — ABNORMAL HIGH (ref 0.61–1.24)
GFR calc non Af Amer: 33 mL/min — ABNORMAL LOW (ref >60)
GFR, EST AFRICAN AMERICAN: 38 mL/min — AB (ref >60)
Glucose, Bld: 112 mg/dL — ABNORMAL HIGH (ref 65–99)
Potassium: 3.3 mmol/L — ABNORMAL LOW (ref 3.5–5.1)
Sodium: 141 mmol/L (ref 135–145)

## 2017-03-18 LAB — PROTIME-INR
INR: 1.51
PROTHROMBIN TIME: 18.4 s — AB (ref 11.4–15.2)

## 2017-03-18 LAB — STREP PNEUMONIAE URINARY ANTIGEN: Strep Pneumo Urinary Antigen: NEGATIVE

## 2017-03-18 LAB — GLUCOSE, CAPILLARY
GLUCOSE-CAPILLARY: 113 mg/dL — AB (ref 65–99)
GLUCOSE-CAPILLARY: 110 mg/dL — AB (ref 65–99)
Glucose-Capillary: 111 mg/dL — ABNORMAL HIGH (ref 65–99)
Glucose-Capillary: 144 mg/dL — ABNORMAL HIGH (ref 65–99)
Glucose-Capillary: 148 mg/dL — ABNORMAL HIGH (ref 65–99)

## 2017-03-18 LAB — HEPARIN LEVEL (UNFRACTIONATED): Heparin Unfractionated: <0.1 IU/mL — ABNORMAL LOW (ref 0.30–0.70)

## 2017-03-18 LAB — HIV ANTIBODY (ROUTINE TESTING W REFLEX): HIV Screen 4th Generation wRfx: NONREACTIVE

## 2017-03-18 MED ORDER — FREE WATER
100.0000 mL | Freq: Four times a day (QID) | Status: DC
  Administered 2017-03-18 – 2017-03-19 (×4): 100 mL

## 2017-03-18 MED ORDER — METOCLOPRAMIDE HCL 5 MG PO TABS
5.0000 mg | ORAL_TABLET | Freq: Three times a day (TID) | ORAL | Status: DC
  Administered 2017-03-18 – 2017-03-27 (×28): 5 mg
  Filled 2017-03-18 (×28): qty 1

## 2017-03-18 MED ORDER — ATORVASTATIN CALCIUM 40 MG PO TABS
40.0000 mg | ORAL_TABLET | Freq: Every day | ORAL | Status: DC
  Administered 2017-03-18 – 2017-03-29 (×12): 40 mg
  Filled 2017-03-18 (×12): qty 1

## 2017-03-18 MED ORDER — JEVITY 1.2 CAL PO LIQD: 1000.0000 mL | ORAL | Status: DC

## 2017-03-18 MED ORDER — DEXTROSE 5 % IV SOLN
2.0000 g | INTRAVENOUS | Status: DC
  Administered 2017-03-19 – 2017-03-20 (×2): 2 g via INTRAVENOUS
  Filled 2017-03-18 (×2): qty 2

## 2017-03-18 MED ORDER — MAGNESIUM SULFATE 2 GM/50ML IV SOLN
2.0000 g | Freq: Once | INTRAVENOUS | Status: AC
  Administered 2017-03-19: 2 g via INTRAVENOUS
  Filled 2017-03-18: qty 50

## 2017-03-18 MED ORDER — PRO-STAT SUGAR FREE PO LIQD
30.0000 mL | Freq: Two times a day (BID) | ORAL | Status: DC
  Administered 2017-03-18 – 2017-03-30 (×25): 30 mL
  Filled 2017-03-18 (×25): qty 30

## 2017-03-18 MED ORDER — OLANZAPINE 5 MG PO TABS
5.0000 mg | ORAL_TABLET | Freq: Two times a day (BID) | ORAL | Status: DC
  Administered 2017-03-18 – 2017-03-30 (×25): 5 mg
  Filled 2017-03-18 (×27): qty 1

## 2017-03-18 MED ORDER — WARFARIN SODIUM 3 MG PO TABS
9.0000 mg | ORAL_TABLET | Freq: Once | ORAL | Status: AC
  Administered 2017-03-18: 9 mg
  Filled 2017-03-18: qty 1

## 2017-03-18 MED ORDER — CHLORHEXIDINE GLUCONATE CLOTH 2 % EX PADS
6.0000 | MEDICATED_PAD | Freq: Every day | CUTANEOUS | Status: AC
  Administered 2017-03-18 – 2017-03-22 (×5): 6 via TOPICAL

## 2017-03-18 MED ORDER — ASPIRIN 81 MG PO CHEW
81.0000 mg | CHEWABLE_TABLET | Freq: Every day | ORAL | Status: DC
  Administered 2017-03-18 – 2017-03-30 (×13): 81 mg
  Filled 2017-03-18 (×13): qty 1

## 2017-03-18 MED ORDER — OSMOLITE 1.5 CAL PO LIQD
1000.0000 mL | ORAL | Status: DC
  Administered 2017-03-18 – 2017-03-29 (×10): 1000 mL
  Filled 2017-03-18 (×23): qty 1000

## 2017-03-18 MED ORDER — OXYCODONE-ACETAMINOPHEN 5-325 MG PO TABS
1.0000 | ORAL_TABLET | Freq: Four times a day (QID) | ORAL | Status: DC | PRN
  Administered 2017-03-21: 2
  Administered 2017-03-23: 1
  Administered 2017-03-24 – 2017-03-29 (×7): 2
  Filled 2017-03-18 (×4): qty 2
  Filled 2017-03-18: qty 1
  Filled 2017-03-18 (×4): qty 2

## 2017-03-18 MED ORDER — VANCOMYCIN HCL IN DEXTROSE 750-5 MG/150ML-% IV SOLN
750.0000 mg | Freq: Two times a day (BID) | INTRAVENOUS | Status: DC
  Administered 2017-03-18 – 2017-03-19 (×4): 750 mg via INTRAVENOUS
  Filled 2017-03-18 (×6): qty 150

## 2017-03-18 MED ORDER — MUPIROCIN 2 % EX OINT
1.0000 "application " | TOPICAL_OINTMENT | Freq: Two times a day (BID) | CUTANEOUS | Status: AC
  Administered 2017-03-18 – 2017-03-22 (×10): 1 via NASAL
  Filled 2017-03-18 (×3): qty 22

## 2017-03-18 MED ORDER — HEPARIN (PORCINE) IN NACL 100-0.45 UNIT/ML-% IJ SOLN
1950.0000 [IU]/h | INTRAMUSCULAR | Status: DC
  Administered 2017-03-18: 1800 [IU]/h via INTRAVENOUS
  Administered 2017-03-19: 1950 [IU]/h via INTRAVENOUS
  Filled 2017-03-18 (×2): qty 250

## 2017-03-18 MED ORDER — POTASSIUM CHLORIDE 20 MEQ PO PACK
40.0000 meq | PACK | Freq: Once | ORAL | Status: AC
  Administered 2017-03-18: 40 meq
  Filled 2017-03-18: qty 2

## 2017-03-18 NOTE — Progress Notes (Signed)
Initial Nutrition Assessment  DOCUMENTATION CODES:   Not applicable  INTERVENTION:  - Will order Osmolite 1.2 @ 55 mL/hr with 30 mL Prostat BID and 100 mL free water QID. This regimen will provide 2180 kcal, 113 grams of protein, and 1406 mL free water - Will monitor for ability to transition to nocturnal feeds at follow-up.   NUTRITION DIAGNOSIS:   Inadequate oral intake related to inability to eat as evidenced by NPO status.  GOAL:   Patient will meet greater than or equal to 90% of their needs  MONITOR:   TF tolerance, Weight trends, Labs, I & O's  REASON FOR ASSESSMENT:   Malnutrition Screening Tool, Consult Enteral/tube feeding initiation and management  ASSESSMENT:   61 year old male who was sent from intervention facility for hypoxia. He has a history of CHF, coronary disease, hypertension and hyperlipidemia with a prior stroke and left-sided hemiparesis. He  was recently admitted to [facility]. He is trach dependent. He was discharged from [facility] to [facility] on 5/24. According to the patient's sister, he was receiving regular suctioning from [facility] as well as oxygen by tracheal cuff. She noted this morning at the nursing home the patient was not on his oxygen and he had not been suctioned all night.  The nursing facility noticed that his oxygen saturations were in the low 80s this morning (5/25).  Pt seen for consult for TF. BMI indicates overweight status. Pt is NPO. Notes indicate that pt is alert and oriented to self only. He was sleeping at time of RD visit but was able to be aroused to name call x2 but quickly falls back to sleep. No family/visitors present. No information in the chart concerning TF regimen PTA other than he was ordered to receive 60 mL Prostat BID.  Physical assessment shows no muscle or fat wasting at this time. Per chart review, pt was 220 lbs (99.8 kg) on 1/31 and then gained weight to weigh 256 lbs (116.1 kg) on  2/20 and this same weight was recorded on 4/1 and 4/20. Current weight recorded as 225 lbs (102.1 kg). Will monitor weight trends closely.  Medications reviewed; sliding scale Novolog, 5 mg Reglan per PEG TID. Labs reviewed; creatinine: 2.06 mg/dL, GFR: 33 mL/min.   Diet Order:  Diet NPO time specified  Skin:  Reviewed, no issues  Last BM:  PTA/unkown  Height:   Ht Readings from Last 1 Encounters:  03/17/17 6\' 2"  (1.88 m)    Weight:   Wt Readings from Last 1 Encounters:  03/17/17 225 lb (102.1 kg)    Ideal Body Weight:  86.36 kg  BMI:  Body mass index is 28.89 kg/m.  Estimated Nutritional Needs:   Kcal:  0981-1914 (20-22 kcal/kg)  Protein:  102-122 grams (1-1.2 grams/kg)  Fluid:  2-2.2 L/day  EDUCATION NEEDS:   No education needs identified at this time    Trenton Gammon, MS, RD, LDN, CNSC Inpatient Clinical Dietitian Pager # [phone] After hours/weekend pager # [phone]

## 2017-03-18 NOTE — Progress Notes (Signed)
ANTICOAGULATION CONSULT NOTE  Pharmacy Consult for Warfarin + Heparin Indication: history of mural apical thrombus  Patient Measurements: Weight: 212 lb 1.3 oz (96.2 kg)  Ideal body weight: 82.2 kg (181 lb 3.5 oz) Adjusted ideal body weight: 87.8 kg (193 lb 9 oz)  Heparin Wt: 102 kg  Vital Signs: Temp: 98 F (36.7 C) (05/26 2126) Temp Source: Oral (05/26 1440) BP: 132/84 (05/26 2126) Pulse Rate: 103 (05/26 2126)  Labs:  Recent Labs  03/16/17 0537 03/17/17 1413 03/18/17 0353 03/18/17 2041  HGB  --  11.8* 9.9*  --   HCT  --  36.9* 32.0*  --   PLT  --  223 224  --   LABPROT 19.4*  --  18.4*  --   INR 1.62  --  1.51  --   HEPARINUNFRC  --   --   --  <0.10*  CREATININE  --  2.04* 2.06*  --     Estimated Creatinine Clearance: 43.2 mL/min (A) (by C-G formula based on SCr of 2.06 mg/dL (H)).  Assessment: 64 YOM with complicated medical history of cerebral infarction with trach and g-tube. History of apical thrombus on chronic coumadin. Pharmacy consulted to dose warfarin with a heparin bridge this admission.  Heparin level this evening is SUBtherapeutic (HL<0.1) however heparin drip was started late and therefore level is not reflective of steady state. Will increase slightly and follow-up with a repeat level in the AM  Goal of Therapy:  INR 2-3 Monitor platelets by anticoagulation protocol: Yes   Plan:  1. Increase heparin drip to 1950 units/hr (19.5 ml/hr) 2. Will continue to monitor for any signs/symptoms of bleeding and will follow up with heparin level in the a.m.   Thank you for allowing pharmacy to be a part of this patient's care.  Georgina Pillion, PharmD, BCPS Clinical Pharmacist Pager: [phone] Clinical phone for 03/18/2017 from 7a-3:30p: [phone] If after 3:30p, please call main pharmacy at: [phone] 03/18/2017 9:31 PM

## 2017-03-18 NOTE — Progress Notes (Signed)
Pt was admitted from ED per stretcher, on arrival to the floor pt was fully alert but only oriented to self and also the year, he is kind of confuse and has short term memory and also forgetful, self introduced to pt ID bracelet checked, vital signs  are stable skin assessment done with another nurse findings documented under assessment, fall prevention plan discussed with pt but will need re-enforcement, dry guase dressing placed on his left hips incision, prescribed treatment started, per on call physician DR OPYD all meds should be given per G-TUBE, and keep pt NPO, call light  and phone within reach, pt has been educated on how to use them, admission hx not done since pt is confuse and there is no family member to provide hx

## 2017-03-18 NOTE — Progress Notes (Signed)
Pharmacy Antibiotic Note  PAMELA JANUSZ is a 61 y.o. male admitted on 03/17/2017 with pneumonia.  Pharmacy has been consulted for cefepime and vancomcyin dosing. [facility]   A Vancomycin loading dose was missed or either not charted on 5/25 afternoon. The maintenance dose order was unintentionally not put in - so Vancomycin is just starting 5/26. SCr 2.06, CrCl~40-50 ml/min. Will adjust Cefepime dose for renal function.  Plan: 1. Continue with Vancomycin 750 mg IV every 12 hours 2. Adjust Cefepime to 2g IV every 24 hours 3. Will continue to follow renal function, culture results, LOT, and antibiotic de-escalation plans   Weight: 225 lb (102.1 kg)  Temp (24hrs), Avg:98.2 F (36.8 C), Min:98.2 F (36.8 C), Max:98.2 F (36.8 C)   Recent Labs Lab 03/17/17 1413 03/18/17 0353  WBC 14.6* 15.4*  CREATININE 2.04* 2.06*    Estimated Creatinine Clearance: 47.4 mL/min (A) (by C-G formula based on SCr of 2.06 mg/dL (H)).    Allergies  Allergen Reactions  . Codeine Anaphylaxis and Nausea And Vomiting  . Lisinopril Itching and Rash  . Hydrocodone Other (See Comments)    On MAR  . Hydrocodone-Acetaminophen Nausea And Vomiting    Cannot take any acetaminophen due to liver issues per sister  . Tegaderm Ag Mesh [Silver] Other (See Comments)    On MAR  . Tylenol [Acetaminophen] Nausea Only and Other (See Comments)    Cannot take any acetaminophen due to liver issues per sister    Antimicrobials this admission: Vanc 5/26 >> Cefepime 5/25 >>  Dose adjustments this admission:   Microbiology results: 5/25 MRSA PCR >> positive 5/25 BCx >> ngtd 5/25 RCx >>  Thank you for allowing pharmacy to be a part of this patient's care.  Georgina Pillion, PharmD, BCPS Clinical Pharmacist Pager: [phone] Clinical phone for 03/18/2017 from 7a-3:30p: [phone] If after 3:30p, please call main pharmacy at: [phone] 03/18/2017 11:54 AM

## 2017-03-18 NOTE — Progress Notes (Signed)
Pt had an episode of 9 beats of wide QRS. No symptoms. No complaints of pain. Dr Robb Matar was notified. Magnesium IV was ordered. Order will be completed. Will continue to monitor.

## 2017-03-18 NOTE — Progress Notes (Addendum)
ANTICOAGULATION CONSULT NOTE  Pharmacy Consult for Warfarin + Heparin (see addendum) Indication: history of mural apical thrombus  Patient Measurements: Weight: 225 lb (102.1 kg)  Ideal body weight: 82.2 kg (181 lb 3.5 oz) Adjusted ideal body weight: 90.1 kg (198 lb 11.7 oz)  Heparin Wt: 102 kg  Vital Signs: Temp: 98.2 F (36.8 C) (05/26 0543) Temp Source: Oral (05/26 0543) BP: 126/69 (05/26 0543) Pulse Rate: 98 (05/26 0543)  Labs:  Recent Labs  03/16/17 0537 03/17/17 1413 03/18/17 0353  HGB  --  11.8* 9.9*  HCT  --  36.9* 32.0*  PLT  --  223 224  LABPROT 19.4*  --  18.4*  INR 1.62  --  1.51  CREATININE  --  2.04* 2.06*    Estimated Creatinine Clearance: 47.4 mL/min (A) (by C-G formula based on SCr of 2.06 mg/dL (H)).  Assessment: 41 YOM with complicated medical history of cerebral infarction with trach and g-tube. History of apical thrombus on chronic coumadin. Pharmacy consulted to dose this admission.  INR today remains SUBtherapeutic (INR 1.51 << 1.62, goal of 2-3). Hg 9.9 << 11.8, plts wnl. No overt s/sx of bleeding noted.   Low INR discussed with MD Allena Katz) who will decide if bridging is necessary.  Goal of Therapy:  INR 2-3 Monitor platelets by anticoagulation protocol: Yes   Plan:  1. Warfarin 9 mg x 1 dose at 1800 today 2. MD to decide on if bridging is necessary 3. Will continue to monitor for any signs/symptoms of bleeding and will follow up with PT/INR in the a.m.   Thank you for allowing pharmacy to be a part of this patient's care.  Georgina Pillion, PharmD, BCPS Clinical Pharmacist Pager: [phone] Clinical phone for 03/18/2017 from 7a-3:30p: [phone] If after 3:30p, please call main pharmacy at: [phone] 03/18/2017 11:45 AM    --------------------------------------------------------------------------------------------------------------- Addendum:  Plans are now to start heparin for bridging until INR>/=2. Per a previous admission in February  2018 - the patient was noted to achieve therapeutic heparin levels at a rate of 1950 units/hr. Will plan to start close to this rate.  Goal of Therapy: Heparin level 0.3-0.7 units/ml  Plan 1. Start Heparin at 1800 units/hr (18 ml/hr) 2. Will continue to monitor for any signs/symptoms of bleeding and will follow up with heparin level in 6 hours   Georgina Pillion, PharmD, BCPS Clinical Pharmacist Pager: [phone] Clinical phone for 03/18/2017 from 7a-3:30p: [phone] If after 3:30p, please call main pharmacy at: [phone] 03/18/2017 2:14 PM

## 2017-03-18 NOTE — Progress Notes (Signed)
[facility] Progress Note  Patient: Curtis Keller VOZ:[id number]   PCP: Kirt Boys, DO DOB: 11/17/1955   DOA: 03/17/2017   DOS: 03/18/2017   Date of Service: the patient was seen and examined on 03/18/2017  Subjective: Patient denies any acute complaint, lethargic but able to answered all questions appropriately in yes or no. Prefers not to talk. Denies any acute complaint. No nausea no vomiting. No diarrhea. Reportedly at the rehabilitation patient was started on ice chips per family and plan was to reintroduce by mouth intake after speech evaluation.  Brief hospital course: Pt. with PMH of right dependent respiratory failure, requiring tracheostomy, dysphagia requiring PEG tube placement, COPD, CVA, residual weakness with contracture of the left upper and lower extremity, chronic systolic CHF, dementia, recurrent fall, recent hip fracture with surgery, LV apical thrombus; admitted on 03/17/2017, presented with complaint of hypoxia, was found to healthcare associated pneumonia. Currently further plan is continue IV antibiotics.  Assessment and Plan: 1. Healthcare associated pneumonia. Currently requiring 50% FiO2 on trach collar. [facility] Likely this is a clear evidence of aspiration pneumonia at that the patient cannot tolerate anything by mouth. Currently I would maintain strict nothing by mouth for the patient and will use only G-tube for medications. Tube feeding has been initiated what I would be cautious with residue. Clear instruction were given to nursing staff as well as family. I feel that the patient is not a candidate for by mouth intake given his recurrent evidence of aspiration. Continue IV vancomycin and cefepime, continue nasotracheal suction. May require chest physical therapy with the vest if does not improve. Follow cultures.  2. Chronic anticoagulation for LV apical mural thrombus. Chronic systolic CHF. Patient not on any  diuretics at home. On Lopressor 25 mg twice a day which is currently on hold in the setting of infection. Does not appear volume overloaded. On warfarin, INR subtherapeutic. Will require bridging. From the review of chart, [facility] L[facility] with bridging, given his chronic kidney disease I would give him heparin while the patient is a hospital for bridging.  3. Chronic kidney disease stage 3-4. Renal function stable, will continue to monitor. Vancomycin dosing is adjusted per pharmacy. Currently holding Flomax given the patient's nothing by mouth status.  4. Mood disorder. Dementia. Continuing home regimen while G-tube. Continue Depakote.  Diet: Strictly nothing by mouth, tube feeding DVT Prophylaxis: n therapeutic anticoagulation.  Advance goals of care discussion: Full code  Family Communication: no family was present at bedside, at the time of interview. Discussed with patient's sister on the phone. Opportunity was given to ask question and all questions were answered satisfactorily.   Disposition:  Discharge to back to SNF.  Consultants: None Procedures: none  Antibiotics: Anti-infectives    Start     Dose/Rate Route Frequency Ordered Stop   03/19/17 0400  ceFEPIme (MAXIPIME) 2 g in dextrose 5 % 50 mL IVPB     2 g 100 mL/hr over 30 Minutes Intravenous Every 24 hours 03/18/17 1137 03/24/17 0359   03/18/17 1100  vancomycin (VANCOCIN) IVPB 750 mg/150 ml premix     750 mg 150 mL/hr over 60 Minutes Intravenous Every 12 hours 03/18/17 1025     03/17/17 1715  vancomycin (VANCOCIN) 2,000 mg in sodium chloride 0.9 % 500 mL IVPB  Status:  Discontinued     2,000 mg 250 mL/hr over 120 Minutes Intravenous  Once 03/17/17 1702 03/18/17 1024   03/17/17 1645  ceFEPIme (MAXIPIME) 1 g in dextrose 5 % 50 mL IVPB  Status:  Discontinued     1 g 100 mL/hr over 30 Minutes Intravenous Every 8 hours 03/17/17 1643 03/18/17 1137       Objective: Physical  Exam: Vitals:   03/18/17 0032 03/18/17 0307 03/18/17 0543 03/18/17 1440  BP:   126/69 114/76  Pulse: (!) 110 (!) 102 98   Resp: 20 (!) 21 18 20   Temp:   98.2 F (36.8 C) 98.6 F (37 C)  TempSrc:   Oral Oral  SpO2: 95% 93% 100% 96%  Weight:        Intake/Output Summary (Last 24 hours) at 03/18/17 1744 Last data filed at 03/18/17 1638  Gross per 24 hour  Intake            430.1 ml  Output              200 ml  Net            230.1 ml   Filed Weights   03/17/17 1700  Weight: 102.1 kg (225 lb)   General: Alert, Awake and Oriented to Person. Appear in moderate distress, affect appropriate Eyes: PERRL, Conjunctiva normal ENT: Oral Mucosa clear moist. Neck: difficult to assess JVD, no Abnormal Mass Or lumps Cardiovascular: S1 and S2 Present, aortic systolic Murmur, Respiratory: Bilateral Air entry equal and Decreased, no use of accessory muscle, basal Crackles, no wheezes Abdomen: Bowel Sound present, Soft and no tenderness Skin: no redness, no Rash, no induration Extremities: no Pedal edema, no calf tenderness Neurologic: Grossly no focal neuro deficit. Chronic left-sided weakness from prior CVA.  Data Reviewed: CBC:  Recent Labs Lab 03/17/17 1413 03/18/17 0353  WBC 14.6* 15.4*  NEUTROABS 8.2*  --   HGB 11.8* 9.9*  HCT 36.9* 32.0*  MCV 101.4* 103.2*  PLT 223 224   Basic Metabolic Panel:  Recent Labs Lab 03/17/17 1413 03/18/17 0353  NA 139 141  K 3.6 3.3*  CL 102 107  CO2 28 27  GLUCOSE 130* 112*  BUN 48* 45*  CREATININE 2.04* 2.06*  CALCIUM 10.5* 9.9    Liver Function Tests: No results for input(s): AST, ALT, ALKPHOS, BILITOT, PROT, ALBUMIN in the last 168 hours. No results for input(s): LIPASE, AMYLASE in the last 168 hours. No results for input(s): AMMONIA in the last 168 hours. Coagulation Profile:  Recent Labs Lab 03/13/17 0614 03/14/17 0833 03/15/17 0835 03/16/17 0537 03/18/17 0353  INR 2.12 1.95 1.80 1.62 1.51   Cardiac Enzymes: No  results for input(s): CKTOTAL, CKMB, CKMBINDEX, TROPONINI in the last 168 hours. BNP (last 3 results) No results for input(s): PROBNP in the last 8760 hours. CBG:  Recent Labs Lab 03/17/17 2053 03/18/17 0748 03/18/17 1136 03/18/17 1742  GLUCAP 125* 113* 110* 111*   Studies: No results found.  Scheduled Meds: . aspirin  81 mg Per Tube Daily  . atorvastatin  40 mg Per Tube QHS  . Chlorhexidine Gluconate Cloth  6 each Topical Q0600  . feeding supplement (PRO-STAT SUGAR FREE 64)  30 mL Per Tube BID  . FLUoxetine  60 mg Per Tube Daily  . free water  100 mL Per Tube Q6H  . insulin aspart  0-15 Units Subcutaneous TID WC  . insulin aspart  0-5 Units Subcutaneous QHS  . metoCLOPramide  5 mg Per Tube TID AC  . mupirocin ointment  1 application Nasal BID  . OLANZapine  5 mg Per Tube BID  . sodium chloride  flush  3 mL Intravenous Q12H  . Valproate Sodium  250 mg Per Tube TID  . warfarin  9 mg Per Tube ONCE-1800  . Warfarin - Pharmacist Dosing Inpatient   Does not apply q1800   Continuous Infusions: . sodium chloride    . [START ON 03/19/2017] ceFEPime (MAXIPIME) IV    . feeding supplement (OSMOLITE 1.5 CAL) 1,000 mL (03/18/17 1205)  . heparin 1,800 Units/hr (03/18/17 1621)  . vancomycin Stopped (03/18/17 1200)   PRN Meds: sodium chloride, oxyCODONE-acetaminophen, sodium chloride flush  Time spent: 35 minutes  Author: Lynden Oxford, MD [facility] Pager: [phone] 03/18/2017 5:44 PM  If 7PM-7AM, please contact night-coverage at [email or web address], password [facility]

## 2017-03-19 ENCOUNTER — Inpatient Hospital Stay (HOSPITAL_COMMUNITY): Payer: Medicare Other

## 2017-03-19 LAB — DIFFERENTIAL
BAND NEUTROPHILS: 26 %
BASOS ABS: 0.5 10*3/uL — AB (ref 0.0–0.1)
BASOS PCT: 3 %
Blasts: 0 %
EOS ABS: 2.9 10*3/uL — AB (ref 0.0–0.7)
Eosinophils Relative: 17 %
LYMPHS ABS: 3.1 10*3/uL (ref 0.7–4.0)
Lymphocytes Relative: 18 %
METAMYELOCYTES PCT: 0 %
MONO ABS: 1.4 10*3/uL — AB (ref 0.1–1.0)
MYELOCYTES: 0 %
Monocytes Relative: 8 %
NEUTROS ABS: 9.1 10*3/uL — AB (ref 1.7–7.7)
NEUTROS PCT: 27 %
NRBC: 0 /100{WBCs}
Other: 1 %
Promyelocytes Absolute: 0 %
WBC MORPHOLOGY: INCREASED

## 2017-03-19 LAB — BASIC METABOLIC PANEL
Anion gap: 6 (ref 5–15)
BUN: 46 mg/dL — AB (ref 6–20)
CALCIUM: 9.6 mg/dL (ref 8.9–10.3)
CO2: 27 mmol/L (ref 22–32)
Chloride: 109 mmol/L (ref 101–111)
Creatinine, Ser: 1.82 mg/dL — ABNORMAL HIGH (ref 0.61–1.24)
GFR calc Af Amer: 44 mL/min — ABNORMAL LOW (ref >60)
GFR, EST NON AFRICAN AMERICAN: 38 mL/min — AB (ref >60)
GLUCOSE: 164 mg/dL — AB (ref 65–99)
POTASSIUM: 3.5 mmol/L (ref 3.5–5.1)
Sodium: 142 mmol/L (ref 135–145)

## 2017-03-19 LAB — GLUCOSE, CAPILLARY
GLUCOSE-CAPILLARY: 137 mg/dL — AB (ref 65–99)
GLUCOSE-CAPILLARY: 155 mg/dL — AB (ref 65–99)
Glucose-Capillary: 114 mg/dL — ABNORMAL HIGH (ref 65–99)
Glucose-Capillary: 160 mg/dL — ABNORMAL HIGH (ref 65–99)
Glucose-Capillary: 123 mg/dL — ABNORMAL HIGH (ref 65–99)
Glucose-Capillary: 168 mg/dL — ABNORMAL HIGH (ref 65–99)

## 2017-03-19 LAB — HEPATIC FUNCTION PANEL
ALBUMIN: 1.9 g/dL — AB (ref 3.5–5.0)
ALK PHOS: 86 U/L (ref 38–126)
ALT: 21 U/L (ref 17–63)
AST: 31 U/L (ref 15–41)
Bilirubin, Direct: <0.1 mg/dL — ABNORMAL LOW (ref 0.1–0.5)
TOTAL PROTEIN: 7.1 g/dL (ref 6.5–8.1)
Total Bilirubin: 0.2 mg/dL — ABNORMAL LOW (ref 0.3–1.2)

## 2017-03-19 LAB — CBC
HCT: 31.7 % — ABNORMAL LOW (ref 39.0–52.0)
Hemoglobin: 9.8 g/dL — ABNORMAL LOW (ref 13.0–17.0)
MCH: 31.7 pg (ref 26.0–34.0)
MCHC: 30.9 g/dL (ref 30.0–36.0)
MCV: 102.6 fL — AB (ref 78.0–100.0)
PLATELETS: 243 10*3/uL (ref 150–400)
RBC: 3.09 MIL/uL — ABNORMAL LOW (ref 4.22–5.81)
RDW: 15.7 % — AB (ref 11.5–15.5)
WBC: 17.1 10*3/uL — ABNORMAL HIGH (ref 4.0–10.5)

## 2017-03-19 LAB — PROTIME-INR
INR: 2.07
Prothrombin Time: 23.6 seconds — ABNORMAL HIGH (ref 11.4–15.2)

## 2017-03-19 LAB — MAGNESIUM: Magnesium: 2.4 mg/dL (ref 1.7–2.4)

## 2017-03-19 LAB — HEPARIN LEVEL (UNFRACTIONATED): HEPARIN UNFRACTIONATED: 0.16 [IU]/mL — AB (ref 0.30–0.70)

## 2017-03-19 MED ORDER — FUROSEMIDE 10 MG/ML IJ SOLN
INTRAMUSCULAR | Status: AC
  Administered 2017-03-19: 20 mg via INTRAVENOUS
  Filled 2017-03-19: qty 2

## 2017-03-19 MED ORDER — WARFARIN SODIUM 6 MG PO TABS
6.0000 mg | ORAL_TABLET | Freq: Once | ORAL | Status: AC
  Administered 2017-03-19: 6 mg
  Filled 2017-03-19: qty 1

## 2017-03-19 MED ORDER — FUROSEMIDE 10 MG/ML IJ SOLN
20.0000 mg | Freq: Once | INTRAMUSCULAR | Status: AC
  Administered 2017-03-19: 20 mg via INTRAVENOUS

## 2017-03-19 NOTE — Progress Notes (Signed)
ANTICOAGULATION CONSULT NOTE  Pharmacy Consult for Warfarin Indication: history of mural apical thrombus  Patient Measurements: Weight: 218 lb 4.8 oz (99 kg)  Ideal body weight: 82.2 kg (181 lb 3.5 oz) Adjusted ideal body weight: 88.9 kg (196 lb 0.8 oz)  Heparin Wt: 102 kg  Vital Signs: Temp: 98.4 F (36.9 C) (05/27 0522) BP: 114/70 (05/27 0522) Pulse Rate: 95 (05/27 1155)  Labs:  Recent Labs  03/17/17 1413 03/18/17 0353 03/18/17 2041 03/19/17 0541 03/19/17 0849  HGB 11.8* 9.9*  --   --  9.8*  HCT 36.9* 32.0*  --   --  31.7*  PLT 223 224  --   --  243  LABPROT  --  18.4*  --  23.6*  --   INR  --  1.51  --  2.07  --   HEPARINUNFRC  --   --  <0.10* 0.16*  --   CREATININE 2.04* 2.06*  --   --  1.82*    Estimated Creatinine Clearance: 52.9 mL/min (A) (by C-G formula based on SCr of 1.82 mg/dL (H)).  Assessment: 19 YOM with complicated medical history of cerebral infarction with trach and g-tube. History of apical thrombus on chronic coumadin. Pharmacy consulted to dose warfarin with a heparin bridge this admission.  Heparin bridge was discontinued this morning as INR became therapeutic (INR 2.07 << 1.51, goal of 2-3). CBC low but stable. No overt s/sx of bleeding noted.  Goal of Therapy:  INR 2-3 Monitor platelets by anticoagulation protocol: Yes   Plan:  1. D/c Heparin bridge 2. Warfarin 6 mg x 1 dose at 1800 today 3. Will continue to monitor for any signs/symptoms of bleeding and will follow up with PT/INR in the a.m.   Thank you for allowing pharmacy to be a part of this patient's care.  Georgina Pillion, PharmD, BCPS Clinical Pharmacist Pager: [phone] Clinical phone for 03/19/2017 from 7a-3:30p: [phone] If after 3:30p, please call main pharmacy at: [phone] 03/19/2017 12:29 PM

## 2017-03-19 NOTE — Progress Notes (Signed)
PT Cancellation Note  Patient Details Name: Curtis Keller MRN: [id number] DOB: 24-May-1956   Cancelled Treatment:    Reason Eval/Treat Not Completed: Patient not medically ready Remains on strict bed rest order at this time. Will follow-up tomorrow as appropriate for comprehensive PT evaluation.  Berton Mount 03/19/2017, 3:38 PM  [phone]

## 2017-03-19 NOTE — Progress Notes (Signed)
Report given to RN on 4E. Patient stable, vital signs WDL, patient in no acute distress at this time. Will transfer to 4E in the next few minutes.

## 2017-03-19 NOTE — Progress Notes (Addendum)
[facility] Progress Note  Patient: Curtis Keller ZOX:[id number]   PCP: Kirt Boys, DO DOB: 18-Feb-1956   DOA: 03/17/2017   DOS: 03/19/2017   Date of Service: the patient was seen and examined on 03/19/2017  Subjective: Patient mentions he is feeling better. His breathing is also better. No abdominal pain. No nausea no vomiting. No diarrhea reported. No active bleeding reported.  Brief hospital course: Pt. with PMH of right dependent respiratory failure, requiring tracheostomy, dysphagia requiring PEG tube placement, COPD, CVA, residual weakness with contracture of the left upper and lower extremity, chronic systolic CHF, dementia, recurrent fall, recent hip fracture with surgery, LV apical thrombus; admitted on 03/17/2017, presented with complaint of hypoxia, was found to healthcare associated pneumonia. Currently further plan is continue IV antibiotics.  Assessment and Plan: 1. Healthcare associated pneumonia. Currently requiring 50% FiO2 on trach collar. [facility] Likely this is a clear evidence of aspiration pneumonia at that the patient cannot tolerate anything by mouth. Currently I would maintain strict nothing by mouth for the patient and will use only G-tube for medications. Tube feeding has been initiated what I would be cautious with residue. Clear instruction were given to nursing staff as well as family. I feel that the patient is not a candidate for by mouth intake given his recurrent evidence of aspiration. Continue IV vancomycin and cefepime, continue nasotracheal suction. May require chest physical therapy with the vest if does not improve. Follow cultures. Repeat chest x-ray shows no significant abnormality. Leukocytosis is actually worsening. If does not improve we'll discuss with ID.  2. Chronic anticoagulation for LV apical mural thrombus. Chronic systolic CHF. Patient not on any diuretics at home. On Lopressor 25 mg twice a  day which is currently on hold in the setting of infection. Does not appear volume overloaded. On warfarin, INR subtherapeutic. Will require bridging. From the review of chart, [facility] with bridging, given his chronic kidney disease I gave him heparin while the patient is a hospital for bridging. Currently INR therapeutic, will transition back to Coumadin only.  3. Chronic kidney disease stage 3-4. Renal function stable, will continue to monitor. Vancomycin dosing is adjusted per pharmacy. Currently holding Flomax given the patient's nothing by mouth status.  4. Mood disorder. Dementia. Continuing home regimen while G-tube. Continue Depakote.  5. Chronic systolic CHF. There is concern for acute on chronic CHF as the chest x-rays mentioned possible worsening pulmonary edema. There is also worsening of his oxygen requirement. Given I will transfer to step down unit, and give the patient 20 mg IV Lasix. Also add chest PT. Low threshold for pulmonary consult.  6. Right upper extr swelling Present on admission, mild worsening  Elevate extremity, rule out dvt   Diet: Strictly nothing by mouth, tube feeding DVT Prophylaxis: therapeutic anticoagulation.  Advance goals of care discussion: Full code  Family Communication: no family was present at bedside, at the time of interview. Discussed with patient's sister on the phone. Opportunity was given to ask question and all questions were answered satisfactorily.   Disposition:  Discharge to back to SNF.  Consultants: None Procedures: none  Antibiotics: Anti-infectives    Start     Dose/Rate Route Frequency Ordered Stop   03/19/17 0400  ceFEPIme (MAXIPIME) 2 g in dextrose 5 % 50 mL IVPB     2 g 100 mL/hr over 30 Minutes Intravenous Every 24 hours 03/18/17 1137 03/24/17 0359   03/18/17 1100  vancomycin (VANCOCIN) IVPB 750 mg/150 ml premix     750 mg 150 mL/hr over 60 Minutes Intravenous Every  12 hours 03/18/17 1025     03/17/17 1715  vancomycin (VANCOCIN) 2,000 mg in sodium chloride 0.9 % 500 mL IVPB  Status:  Discontinued     2,000 mg 250 mL/hr over 120 Minutes Intravenous  Once 03/17/17 1702 03/18/17 1024   03/17/17 1645  ceFEPIme (MAXIPIME) 1 g in dextrose 5 % 50 mL IVPB  Status:  Discontinued     1 g 100 mL/hr over 30 Minutes Intravenous Every 8 hours 03/17/17 1643 03/18/17 1137       Objective: Physical Exam: Vitals:   03/19/17 0522 03/19/17 0907 03/19/17 1155 03/19/17 1538  BP: 114/70     Pulse: 99 (!) 103 95 95  Resp: 18 18 18 18   Temp: 98.4 F (36.9 C)     TempSrc:      SpO2: 97% 93% 95% 95%  Weight: 99 kg (218 lb 4.8 oz)       Intake/Output Summary (Last 24 hours) at 03/19/17 1614 Last data filed at 03/19/17 0650  Gross per 24 hour  Intake           673.93 ml  Output              600 ml  Net            73.93 ml   Filed Weights   03/17/17 1700 03/18/17 1700 03/19/17 0522  Weight: 102.1 kg (225 lb) 96.2 kg (212 lb 1.3 oz) 99 kg (218 lb 4.8 oz)   General: Alert, Awake and Oriented to Person. Appear in moderate distress, affect appropriate Eyes: PERRL, Conjunctiva normal ENT: Oral Mucosa clear moist. Neck: difficult to assess JVD, no Abnormal Mass Or lumps Cardiovascular: S1 and S2 Present, aortic systolic Murmur, Respiratory: Bilateral Air entry equal and Decreased, no use of accessory muscle, basal Crackles, no wheezes Abdomen: Bowel Sound present, Soft and no tenderness Skin: no redness, no Rash, no induration Extremities: no Pedal edema, no calf tenderness Neurologic: Grossly no focal neuro deficit. Chronic left-sided weakness from prior CVA.  Data Reviewed: CBC:  Recent Labs Lab 03/17/17 1413 03/18/17 0353 03/19/17 0849 03/19/17 1339  WBC 14.6* 15.4* 17.1*  --   NEUTROABS 8.2*  --   --  PENDING  HGB 11.8* 9.9* 9.8*  --   HCT 36.9* 32.0* 31.7*  --   MCV 101.4* 103.2* 102.6*  --   PLT 223 224 243  --    Basic Metabolic Panel:  Recent  Labs Lab 03/17/17 1413 03/18/17 0353 03/19/17 0849  NA 139 141 142  K 3.6 3.3* 3.5  CL 102 107 109  CO2 28 27 27   GLUCOSE 130* 112* 164*  BUN 48* 45* 46*  CREATININE 2.04* 2.06* 1.82*  CALCIUM 10.5* 9.9 9.6  MG  --   --  2.4    Liver Function Tests:  Recent Labs Lab 03/19/17 1339  AST 31  ALT 21  ALKPHOS 86  BILITOT 0.2*  PROT 7.1  ALBUMIN 1.9*   No results for input(s): LIPASE, AMYLASE in the last 168 hours. No results for input(s): AMMONIA in the last 168 hours. Coagulation Profile:  Recent Labs Lab 03/14/17 0833 03/15/17 0835 03/16/17 0537 03/18/17 0353 03/19/17 0541  INR 1.95 1.80 1.62 1.51 2.07   Cardiac Enzymes: No results for input(s): CKTOTAL, CKMB, CKMBINDEX, TROPONINI in the last 168 hours. BNP (last 3 results) No results for input(s): PROBNP in  the last 8760 hours. CBG:  Recent Labs Lab 03/18/17 2018 03/18/17 2356 03/19/17 0452 03/19/17 0805 03/19/17 1231  GLUCAP 144* 148* 114* 160* 123*   Studies: Dg Chest Port 1 View  Result Date: 03/19/2017 CLINICAL DATA:  Shortness of breath EXAM: PORTABLE CHEST 1 VIEW COMPARISON:  03/17/2017; 03/06/2017; 01/27/2017 FINDINGS: Unchanged enlarged cardiac silhouette and mediastinal contours. Stable position of support apparatus. Pulmonary vasculature appears less distinct with cephalization of flow. Interval development of trace right and small left-sided effusion with associated worsening bibasilar opacities, left greater than right. No pneumothorax. No acute osseus abnormalities. Old right anterior rib fractures. IMPRESSION: 1.  No acute cardiopulmonary disease. 2. Suspected worsening pulmonary edema and bibasilar atelectasis, left greater than right. Electronically Signed   By: [name] M.D.   On: 03/19/2017 13:40    Scheduled Meds: . aspirin  81 mg Per Tube Daily  . atorvastatin  40 mg Per Tube QHS  . Chlorhexidine Gluconate Cloth  6 each Topical Q0600  . feeding supplement (PRO-STAT SUGAR FREE 64)   30 mL Per Tube BID  . FLUoxetine  60 mg Per Tube Daily  . insulin aspart  0-15 Units Subcutaneous TID WC  . insulin aspart  0-5 Units Subcutaneous QHS  . metoCLOPramide  5 mg Per Tube TID AC  . mupirocin ointment  1 application Nasal BID  . OLANZapine  5 mg Per Tube BID  . sodium chloride flush  3 mL Intravenous Q12H  . Valproate Sodium  250 mg Per Tube TID  . warfarin  6 mg Per Tube ONCE-1800  . Warfarin - Pharmacist Dosing Inpatient   Does not apply q1800   Continuous Infusions: . sodium chloride    . ceFEPime (MAXIPIME) IV Stopped (03/19/17 0524)  . feeding supplement (OSMOLITE 1.5 CAL) 1,000 mL (03/18/17 1205)  . vancomycin Stopped (03/19/17 1400)   PRN Meds: sodium chloride, oxyCODONE-acetaminophen, sodium chloride flush  Time spent: 35 minutes  Author: Lynden Oxford, MD [facility] Pager: [phone] 03/19/2017 4:14 PM  If 7PM-7AM, please contact night-coverage at [email or web address], password [facility]

## 2017-03-19 NOTE — Progress Notes (Signed)
Pharmacist Heart Failure Core Measure Documentation  Assessment: Curtis Keller has an EF documented as 25-30% on 11/28/16 by TTE.  Rationale: Heart failure patients with left ventricular systolic dysfunction (LVSD) and an EF < 40% should be prescribed an angiotensin converting enzyme inhibitor (ACEI) or angiotensin receptor blocker (ARB) at discharge unless a contraindication is documented in the medical record.  This patient is not currently on an ACEI or ARB for HF.  This note is being placed in the record in order to provide documentation that a contraindication to the use of these agents is present for this encounter.  ACE Inhibitor or Angiotensin Receptor Blocker is contraindicated (specify all that apply)  [x]   ACEI allergy AND ARB allergy []   Angioedema []   Moderate or severe aortic stenosis []   Hyperkalemia []   Hypotension []   Renal artery stenosis [x]   Worsening renal function, preexisting renal disease or dysfunction   Rolley Sims 03/19/2017 3:10 PM

## 2017-03-20 DIAGNOSIS — L8990 Pressure ulcer of unspecified site, unspecified stage: Secondary | ICD-10-CM | POA: Insufficient documentation

## 2017-03-20 LAB — BASIC METABOLIC PANEL
Anion gap: 5 (ref 5–15)
BUN: 46 mg/dL — AB (ref 6–20)
CHLORIDE: 108 mmol/L (ref 101–111)
CO2: 29 mmol/L (ref 22–32)
CREATININE: 1.83 mg/dL — AB (ref 0.61–1.24)
Calcium: 9.5 mg/dL (ref 8.9–10.3)
GFR calc Af Amer: 44 mL/min — ABNORMAL LOW (ref >60)
GFR calc non Af Amer: 38 mL/min — ABNORMAL LOW (ref >60)
Glucose, Bld: 151 mg/dL — ABNORMAL HIGH (ref 65–99)
POTASSIUM: 3.3 mmol/L — AB (ref 3.5–5.1)
Sodium: 142 mmol/L (ref 135–145)

## 2017-03-20 LAB — GLUCOSE, CAPILLARY
GLUCOSE-CAPILLARY: 139 mg/dL — AB (ref 65–99)
GLUCOSE-CAPILLARY: 135 mg/dL — AB (ref 65–99)
GLUCOSE-CAPILLARY: 168 mg/dL — AB (ref 65–99)
Glucose-Capillary: 142 mg/dL — ABNORMAL HIGH (ref 65–99)
Glucose-Capillary: 170 mg/dL — ABNORMAL HIGH (ref 65–99)
Glucose-Capillary: 125 mg/dL — ABNORMAL HIGH (ref 65–99)

## 2017-03-20 LAB — CBC
HEMATOCRIT: 31.7 % — AB (ref 39.0–52.0)
HEMOGLOBIN: 9.7 g/dL — AB (ref 13.0–17.0)
MCH: 31.4 pg (ref 26.0–34.0)
MCHC: 30.6 g/dL (ref 30.0–36.0)
MCV: 102.6 fL — AB (ref 78.0–100.0)
Platelets: 280 10*3/uL (ref 150–400)
RBC: 3.09 MIL/uL — ABNORMAL LOW (ref 4.22–5.81)
RDW: 15.8 % — ABNORMAL HIGH (ref 11.5–15.5)
WBC: 13.2 10*3/uL — ABNORMAL HIGH (ref 4.0–10.5)

## 2017-03-20 LAB — VANCOMYCIN, TROUGH: Vancomycin Tr: 42 ug/mL (ref 15–20)

## 2017-03-20 LAB — PROTIME-INR
INR: 2.45
Prothrombin Time: 27 s — ABNORMAL HIGH (ref 11.4–15.2)

## 2017-03-20 LAB — VALPROIC ACID LEVEL: Valproic Acid Lvl: 20 ug/mL — ABNORMAL LOW (ref 50.0–100.0)

## 2017-03-20 LAB — AMMONIA: Ammonia: 37 umol/L — ABNORMAL HIGH (ref 9–35)

## 2017-03-20 MED ORDER — POTASSIUM CHLORIDE 20 MEQ/15ML (10%) PO SOLN
40.0000 meq | Freq: Once | ORAL | Status: AC
  Administered 2017-03-20: 40 meq via ORAL
  Filled 2017-03-20: qty 30

## 2017-03-20 MED ORDER — POTASSIUM CHLORIDE 20 MEQ/15ML (10%) PO SOLN: 20.0000 meq | Freq: Every day | ORAL | Status: DC

## 2017-03-20 MED ORDER — INSULIN ASPART 100 UNIT/ML ~~LOC~~ SOLN
0.0000 [IU] | SUBCUTANEOUS | Status: DC
  Administered 2017-03-20 – 2017-03-21 (×3): 2 [IU] via SUBCUTANEOUS
  Administered 2017-03-21: 3 [IU] via SUBCUTANEOUS
  Administered 2017-03-21: 2 [IU] via SUBCUTANEOUS
  Administered 2017-03-21 (×2): 3 [IU] via SUBCUTANEOUS
  Administered 2017-03-22: 2 [IU] via SUBCUTANEOUS
  Administered 2017-03-22: 3 [IU] via SUBCUTANEOUS
  Administered 2017-03-22: 2 [IU] via SUBCUTANEOUS
  Administered 2017-03-22: 3 [IU] via SUBCUTANEOUS
  Administered 2017-03-22: 2 [IU] via SUBCUTANEOUS
  Administered 2017-03-23 (×2): 3 [IU] via SUBCUTANEOUS
  Administered 2017-03-23 – 2017-03-24 (×3): 2 [IU] via SUBCUTANEOUS
  Administered 2017-03-24: 3 [IU] via SUBCUTANEOUS
  Administered 2017-03-24 – 2017-03-25 (×4): 2 [IU] via SUBCUTANEOUS
  Administered 2017-03-25 (×2): 3 [IU] via SUBCUTANEOUS
  Administered 2017-03-25 – 2017-03-26 (×3): 2 [IU] via SUBCUTANEOUS
  Administered 2017-03-26: 3 [IU] via SUBCUTANEOUS
  Administered 2017-03-26: 2 [IU] via SUBCUTANEOUS
  Administered 2017-03-26: 3 [IU] via SUBCUTANEOUS
  Administered 2017-03-27 – 2017-03-28 (×5): 2 [IU] via SUBCUTANEOUS
  Administered 2017-03-28: 3 [IU] via SUBCUTANEOUS
  Administered 2017-03-28 – 2017-03-30 (×9): 2 [IU] via SUBCUTANEOUS

## 2017-03-20 MED ORDER — FUROSEMIDE 10 MG/ML IJ SOLN
20.0000 mg | Freq: Every day | INTRAMUSCULAR | Status: DC
  Administered 2017-03-21 – 2017-03-24 (×4): 20 mg via INTRAVENOUS
  Filled 2017-03-20 (×4): qty 2

## 2017-03-20 MED ORDER — GLYCOPYRROLATE 0.2 MG/ML IJ SOLN
0.1000 mg | Freq: Two times a day (BID) | INTRAMUSCULAR | Status: DC
  Administered 2017-03-20: 0.1 mg via INTRAVENOUS
  Filled 2017-03-20: qty 1

## 2017-03-20 MED ORDER — WARFARIN SODIUM 6 MG PO TABS
6.0000 mg | ORAL_TABLET | Freq: Once | ORAL | Status: AC
  Administered 2017-03-20: 6 mg via ORAL
  Filled 2017-03-20: qty 1

## 2017-03-20 MED ORDER — SODIUM CHLORIDE 0.9 % IV SOLN
1.5000 g | Freq: Three times a day (TID) | INTRAVENOUS | Status: AC
  Administered 2017-03-21 – 2017-03-24 (×12): 1.5 g via INTRAVENOUS
  Filled 2017-03-20 (×12): qty 1.5

## 2017-03-20 NOTE — Progress Notes (Signed)
PT Cancellation Note  Patient Details Name: Curtis Keller MRN: [id number] DOB: 1955/12/26   Cancelled Treatment:    Reason Eval/Treat Not Completed: Patient not medically ready, bedrest   Fabio Asa 03/20/2017, 7:19 AM

## 2017-03-20 NOTE — Progress Notes (Signed)
Pharmacy Antibiotic Note  Curtis Keller is a 61 y.o. male admitted on 03/17/2017 with pneumonia.  Pharmacy has been consulted for vancomcyin dosing.   A Vancomycin loading dose was missed or either not charted on 5/25. Vancomycin started on 5/26 at 750 mg IV q12hr. SCr has been stable at 1.83 with CrCl ~ 50 mL/min.   Vancomycin trough drawn after 4 doses (and no documented loading dose) is supratherapeutic at 42 mcg/mL. Level was drawn appropriately at approximately 11 hours after previous dose.   Plan: 1. Hold vancomycin today, re-dose pending AM labs  2. Vancomycin random with AM labs  3. Continue Cefepime to 2g IV every 24 hours (per MD) 4. Follow renal function, culture results, LOT, and antibiotic de-escalation plans   Weight: 214 lb 11.7 oz (97.4 kg)  Temp (24hrs), Avg:98.6 F (37 C), Min:97.8 F (36.6 C), Max:99.5 F (37.5 C)   Recent Labs Lab 03/17/17 1413 03/18/17 0353 03/19/17 0849 03/20/17 0135 03/20/17 0949  WBC 14.6* 15.4* 17.1* 13.2*  --   CREATININE 2.04* 2.06* 1.82* 1.83*  --   VANCOTROUGH  --   --   --   --  42*    Estimated Creatinine Clearance: 48.7 mL/min (A) (by C-G formula based on SCr of 1.83 mg/dL (H)).    Allergies  Allergen Reactions  . Codeine Anaphylaxis and Nausea And Vomiting  . Lisinopril Itching and Rash  . Hydrocodone Other (See Comments)    On MAR  . Hydrocodone-Acetaminophen Nausea And Vomiting    Cannot take any acetaminophen due to liver issues per sister  . Tegaderm Ag Mesh [Silver] Other (See Comments)    On MAR  . Tylenol [Acetaminophen] Nausea Only and Other (See Comments)    Cannot take any acetaminophen due to liver issues per sister    Antimicrobials this admission: Vanc 5/26 >> Cefepime 5/25 >>  Dose adjustments this admission: 5/28 VT = 42 mcg/mL, holding vancomycin today   Microbiology results: 5/25 MRSA PCR >> positive 5/25 BCx >> ngtd 5/27 RCx >> px  York Cerise, PharmD Pharmacy Resident  Pager  [phone] 03/20/17 11:07 AM

## 2017-03-20 NOTE — Progress Notes (Signed)
Pharmacy Antibiotic Note  Curtis Keller is a 61 y.o. male admitted on 03/17/2017 with pneumonia.  Pharmacy has been consulted for Unasyn dosing. Today is day 4 of antibiotics. Patient is afebrile, WBC down 13.2, CrCl ~48 ml/min  Supratherapeutic vanc trough today at 42  Plan: 1. D/c vancomycin and cefepime  2. Start Unasyn 1.5g IV Q8H tomorrow  3. Monitor renal function, culture results, LOT  Weight: 214 lb 11.7 oz (97.4 kg)  Temp (24hrs), Avg:98.5 F (36.9 C), Min:97.8 F (36.6 C), Max:99.5 F (37.5 C)   Recent Labs Lab 03/17/17 1413 03/18/17 0353 03/19/17 0849 03/20/17 0135 03/20/17 0949  WBC 14.6* 15.4* 17.1* 13.2*  --   CREATININE 2.04* 2.06* 1.82* 1.83*  --   VANCOTROUGH  --   --   --   --  42*    Estimated Creatinine Clearance: 48.7 mL/min (A) (by C-G formula based on SCr of 1.83 mg/dL (H)).    Allergies  Allergen Reactions  . Codeine Anaphylaxis and Nausea And Vomiting  . Lisinopril Itching and Rash  . Hydrocodone Other (See Comments)    On MAR  . Hydrocodone-Acetaminophen Nausea And Vomiting    Cannot take any acetaminophen due to liver issues per sister  . Tegaderm Ag Mesh [Silver] Other (See Comments)    On MAR  . Tylenol [Acetaminophen] Nausea Only and Other (See Comments)    Cannot take any acetaminophen due to liver issues per sister    Antimicrobials this admission: Vanc 5/26 >>5/28 Cefepime 5/25 >>5/28 Unasyn 5/29>>  Dose adjustments this admission: 5/28 VT = 42 mcg/mL, hold vancomycin  Microbiology results: 5/25 MRSA PCR: positive 5/25 BCx: ngtd 5/27 RCx: pending   Mackie Pai, PharmD PGY1 Pharmacy Resident 03/20/2017 12:51 PM

## 2017-03-20 NOTE — Progress Notes (Signed)
Pt was found on 100% ATC decreased to 70%, pt tolerating well.  RT will continue to monitor.

## 2017-03-20 NOTE — Progress Notes (Signed)
ANTICOAGULATION CONSULT NOTE  Pharmacy Consult for Warfarin Indication: history of mural apical thrombus  Patient Measurements: Weight: 214 lb 11.7 oz (97.4 kg)  Ideal body weight: 82.2 kg (181 lb 3.5 oz) Adjusted ideal body weight: 88.3 kg (194 lb 10 oz)  Heparin Wt: 102 kg  Vital Signs: Temp: 99.5 F (37.5 C) (05/28 0722) Temp Source: Oral (05/28 0722) BP: 129/91 (05/28 0722) Pulse Rate: 103 (05/28 0722)  Labs:  Recent Labs  03/18/17 0353 03/18/17 2041 03/19/17 0541 03/19/17 0849 03/20/17 0135  HGB 9.9*  --   --  9.8* 9.7*  HCT 32.0*  --   --  31.7* 31.7*  PLT 224  --   --  243 280  LABPROT 18.4*  --  23.6*  --  27.0*  INR 1.51  --  2.07  --  2.45  HEPARINUNFRC  --  <0.10* 0.16*  --   --   CREATININE 2.06*  --   --  1.82* 1.83*    Estimated Creatinine Clearance: 48.7 mL/min (A) (by C-G formula based on SCr of 1.83 mg/dL (H)).  Assessment: 1 YOM with complicated medical history of cerebral infarction with trach and g-tube. History of apical thrombus on chronic coumadin. Pharmacy consulted to dose warfarin (s/p heparin bridge d/c'd on 5/27).   INR therapeutic at 2.45 with INR goal of 2-3. CBC low but stable. No overt s/sx of bleeding noted.  Goal of Therapy:  INR 2-3 Monitor platelets by anticoagulation protocol: Yes   Plan:  Warfarin 6 mg x 1 dose at 1800 today Daily INR, CBC as needed Monitor for s/s bleeding   York Cerise, PharmD Pharmacy Resident  Pager [phone] 03/20/17 9:19 AM

## 2017-03-20 NOTE — Progress Notes (Signed)
[facility] Progress Note  Patient: Curtis Keller DQQ:[id number]   PCP: Kirt Boys, DO DOB: 1955/11/23   DOA: 03/17/2017   DOS: 03/20/2017   Date of Service: the patient was seen and examined on 03/20/2017  Subjective: Patient is awake, oriented to follow command. Denies any acute complaint although appears in respiratory distress. Mentions his breathing is about the same. No abdominal pain no chest pain. No vomiting reported. Suctioning requirement has reduced. Tolerating chest physical therapy.  Brief hospital course: Pt. with PMH of right dependent respiratory failure, requiring tracheostomy, dysphagia requiring PEG tube placement, COPD, CVA, residual weakness with contracture of the left upper and lower extremity, chronic systolic CHF, dementia, recurrent fall, recent hip fracture with surgery, LV apical thrombus; admitted on 03/17/2017, presented with complaint of hypoxia, was found to healthcare associated pneumonia. Currently further plan is continue IV antibiotics.  Assessment and Plan: 1. Healthcare associated pneumonia. Likely aspiration event. Acute on chronic respiratory failure with hypoxia requiring increase FiO2. S/P tracheostomy for vent dependent respiratory failure.  Currently requiring 40% FiO2 on trach collar. [facility] to 25% [facility]. this is a clear evidence of aspiration pneumonia at that the patient cannot tolerate anything by mouth. Currently I would maintain strict nothing by mouth for the patient and will use only G-tube for medications. Tube feeding has been initiated, I would be cautious with residue. Clear instruction were given to nursing staff as well as family. I feel that the patient is not a candidate for by mouth intake given his recurrent evidence of aspiration. Started on IV vancomycin and cefepime. Blood cultures negative. I would transition him to IV Unasyn at present. Continue suctioning when necessary, Robinul ordered for  secretion. Leukocytosis getting better.  2. Chronic anticoagulation for LV apical mural thrombus. Acute on Chronic systolic CHF. Patient not on any diuretics at home. On Lopressor 25 mg twice a day which is currently on hold in the setting of infection. Does not appear volume overloaded. On warfarin, INR subtherapeutic. Required bridging.  From the review of chart, [facility] was [facility] with L[facility] with bridging, given his chronic kidney disease I gave him heparin while the patient is a hospital for bridging.  Currently INR therapeutic, transition back to Coumadin only. Gentle diuresis as the patient also has renal failure.  3. Chronic kidney disease stage 3-4. Renal function stable, will continue to monitor. Vancomycin dosing is adjusted per pharmacy. Currently holding Flomax given the patient's nothing by mouth status.  4. Mood disorder. Dementia. Continuing home regimen while G-tube. Continue Depakote.  5. Right upper extr swelling Present on admission, mild worsening  Elevate extremity, rule out dvt  6. Pressure injury, stage II, present on admission. Bilateral Buttocks Continue foam dressing. Frequent turning  Diet: Tube feeding, nothing by mouth DVT Prophylaxis: on therapeutic anticoagulation.  Advance goals of care discussion: Full code  Family Communication: no family was present at bedside, at the time of interview.   Disposition:  Discharge to SNF.  Consultants: none Procedures: none  Antibiotics: Anti-infectives    Start     Dose/Rate Route Frequency Ordered Stop   03/21/17 0600  ampicillin-sulbactam (UNASYN) 1.5 g in sodium chloride 0.9 % 50 mL IVPB     1.5 g 100 mL/hr over 30 Minutes Intravenous Every 8 hours 03/20/17 1247     03/19/17 0400  ceFEPIme (MAXIPIME) 2 g in dextrose 5 % 50 mL IVPB  Status:  Discontinued     2 g 100 mL/hr over 30 Minutes  Intravenous Every 24 hours 03/18/17 1137 03/20/17 1240   03/18/17 1100  vancomycin  (VANCOCIN) IVPB 750 mg/150 ml premix  Status:  Discontinued     750 mg 150 mL/hr over 60 Minutes Intravenous Every 12 hours 03/18/17 1025 03/20/17 1116   03/17/17 1715  vancomycin (VANCOCIN) 2,000 mg in sodium chloride 0.9 % 500 mL IVPB  Status:  Discontinued     2,000 mg 250 mL/hr over 120 Minutes Intravenous  Once 03/17/17 1702 03/18/17 1024   03/17/17 1645  ceFEPIme (MAXIPIME) 1 g in dextrose 5 % 50 mL IVPB  Status:  Discontinued     1 g 100 mL/hr over 30 Minutes Intravenous Every 8 hours 03/17/17 1643 03/18/17 1137       Objective: Physical Exam: Vitals:   03/20/17 0722 03/20/17 1009 03/20/17 1211 03/20/17 1506  BP: (!) 129/91  130/89   Pulse: (!) 103 93    Resp: 20 20    Temp: 99.5 F (37.5 C)  98.2 F (36.8 C)   TempSrc: Oral  Axillary   SpO2: 93% 90%  92%  Weight:        Intake/Output Summary (Last 24 hours) at 03/20/17 1610 Last data filed at 03/20/17 1400  Gross per 24 hour  Intake             1960 ml  Output             1225 ml  Net              735 ml   Filed Weights   03/18/17 1700 03/19/17 0522 03/20/17 0332  Weight: 96.2 kg (212 lb 1.3 oz) 99 kg (218 lb 4.8 oz) 97.4 kg (214 lb 11.7 oz)   General: Alert, Awake and Oriented to Time, Place and Person. Appear in moderate distress, affect flat Eyes: PERRL, Conjunctiva normal ENT: Oral Mucosa clear moist Neck: difficult to assess JVD, no Abnormal Mass Or lumps Cardiovascular: S1 and S2 Present, aortic systolic Murmur, Peripheral Pulses Present Respiratory: increased respiratory effort, Bilateral Air entry equal and Decreased, bilateral Crackles, no wheezes Abdomen: Bowel Sound present, Soft and no tenderness, no hernia Skin: no redness, no Rash, no induration Extremities: bilateral Pedal edema, no calf tenderness Neurologic: Chronic left-sided weakness from prior CVA.  Data Reviewed: CBC:  Recent Labs Lab 03/17/17 1413 03/18/17 0353 03/19/17 0849 03/19/17 1339 03/20/17 0135  WBC 14.6* 15.4* 17.1*  --   13.2*  NEUTROABS 8.2*  --   --  9.1*  --   HGB 11.8* 9.9* 9.8*  --  9.7*  HCT 36.9* 32.0* 31.7*  --  31.7*  MCV 101.4* 103.2* 102.6*  --  102.6*  PLT 223 224 243  --  280   Basic Metabolic Panel:  Recent Labs Lab 03/17/17 1413 03/18/17 0353 03/19/17 0849 03/20/17 0135  NA 139 141 142 142  K 3.6 3.3* 3.5 3.3*  CL 102 107 109 108  CO2 28 27 27 29   GLUCOSE 130* 112* 164* 151*  BUN 48* 45* 46* 46*  CREATININE 2.04* 2.06* 1.82* 1.83*  CALCIUM 10.5* 9.9 9.6 9.5  MG  --   --  2.4  --     Liver Function Tests:  Recent Labs Lab 03/19/17 1339  AST 31  ALT 21  ALKPHOS 86  BILITOT 0.2*  PROT 7.1  ALBUMIN 1.9*   No results for input(s): LIPASE, AMYLASE in the last 168 hours.  Recent Labs Lab 03/20/17 0135  AMMONIA 37*   Coagulation Profile:  Recent  Labs Lab 03/15/17 1610 03/16/17 0537 03/18/17 0353 03/19/17 0541 03/20/17 0135  INR 1.80 1.62 1.51 2.07 2.45   Cardiac Enzymes: No results for input(s): CKTOTAL, CKMB, CKMBINDEX, TROPONINI in the last 168 hours. BNP (last 3 results) No results for input(s): PROBNP in the last 8760 hours. CBG:  Recent Labs Lab 03/19/17 1911 03/19/17 2327 03/20/17 0357 03/20/17 0722 03/20/17 1216  GLUCAP 137* 155* 142* 170* 139*   Studies: No results found.  Scheduled Meds: . aspirin  81 mg Per Tube Daily  . atorvastatin  40 mg Per Tube QHS  . Chlorhexidine Gluconate Cloth  6 each Topical Q0600  . feeding supplement (PRO-STAT SUGAR FREE 64)  30 mL Per Tube BID  . FLUoxetine  60 mg Per Tube Daily  . furosemide  20 mg Intravenous Daily  . insulin aspart  0-15 Units Subcutaneous Q4H  . metoCLOPramide  5 mg Per Tube TID AC  . mupirocin ointment  1 application Nasal BID  . OLANZapine  5 mg Per Tube BID  . potassium chloride  20 mEq Per Tube Daily  . sodium chloride flush  3 mL Intravenous Q12H  . Valproate Sodium  250 mg Per Tube TID  . warfarin  6 mg Oral ONCE-1800  . Warfarin - Pharmacist Dosing Inpatient   Does not  apply q1800   Continuous Infusions: . sodium chloride 250 mL (03/19/17 1900)  . [START ON 03/21/2017] ampicillin-sulbactam (UNASYN) IV    . feeding supplement (OSMOLITE 1.5 CAL) 1,000 mL (03/19/17 1800)   PRN Meds: sodium chloride, oxyCODONE-acetaminophen, sodium chloride flush  Time spent: 45 minutes  The patient is critically ill with multiple organ systems failure and requires high complexity decision making for assessment and support, frequent evaluation and titration of therapies. Critical Care Time devoted to patient care services described in this note is 40 minutes   Author: Lynden Oxford, MD [facility] Pager: [phone] 03/20/2017 4:10 PM  If 7PM-7AM, please contact night-coverage at [email or web address], password [facility]

## 2017-03-21 ENCOUNTER — Inpatient Hospital Stay (HOSPITAL_COMMUNITY): Payer: Medicare Other

## 2017-03-21 DIAGNOSIS — R609 Edema, unspecified: Secondary | ICD-10-CM

## 2017-03-21 LAB — BASIC METABOLIC PANEL
Anion gap: 8 (ref 5–15)
BUN: 50 mg/dL — AB (ref 6–20)
CALCIUM: 9.4 mg/dL (ref 8.9–10.3)
CO2: 25 mmol/L (ref 22–32)
CREATININE: 1.82 mg/dL — AB (ref 0.61–1.24)
Chloride: 108 mmol/L (ref 101–111)
GFR calc Af Amer: 44 mL/min — ABNORMAL LOW (ref >60)
GFR, EST NON AFRICAN AMERICAN: 38 mL/min — AB (ref >60)
GLUCOSE: 145 mg/dL — AB (ref 65–99)
POTASSIUM: 5.1 mmol/L (ref 3.5–5.1)
SODIUM: 141 mmol/L (ref 135–145)

## 2017-03-21 LAB — GLUCOSE, CAPILLARY
GLUCOSE-CAPILLARY: 143 mg/dL — AB (ref 65–99)
Glucose-Capillary: 118 mg/dL — ABNORMAL HIGH (ref 65–99)
Glucose-Capillary: 123 mg/dL — ABNORMAL HIGH (ref 65–99)
Glucose-Capillary: 134 mg/dL — ABNORMAL HIGH (ref 65–99)
Glucose-Capillary: 163 mg/dL — ABNORMAL HIGH (ref 65–99)
Glucose-Capillary: 169 mg/dL — ABNORMAL HIGH (ref 65–99)

## 2017-03-21 LAB — CBC
HCT: 32.6 % — ABNORMAL LOW (ref 39.0–52.0)
Hemoglobin: 10.1 g/dL — ABNORMAL LOW (ref 13.0–17.0)
MCH: 32.1 pg (ref 26.0–34.0)
MCHC: 31 g/dL (ref 30.0–36.0)
MCV: 103.5 fL — AB (ref 78.0–100.0)
PLATELETS: 310 10*3/uL (ref 150–400)
RBC: 3.15 MIL/uL — ABNORMAL LOW (ref 4.22–5.81)
RDW: 15.9 % — AB (ref 11.5–15.5)
WBC: 12.1 10*3/uL — ABNORMAL HIGH (ref 4.0–10.5)

## 2017-03-21 LAB — CULTURE, RESPIRATORY W GRAM STAIN

## 2017-03-21 LAB — CULTURE, RESPIRATORY

## 2017-03-21 LAB — PROTIME-INR
INR: 2.46
PROTHROMBIN TIME: 27.1 s — AB (ref 11.4–15.2)

## 2017-03-21 MED ORDER — FUROSEMIDE 10 MG/ML PO SOLN
20.0000 mg | Freq: Every day | ORAL | Status: DC
  Administered 2017-03-21 – 2017-03-22 (×2): 20 mg
  Filled 2017-03-21 (×3): qty 2

## 2017-03-21 MED ORDER — WARFARIN SODIUM 6 MG PO TABS
6.0000 mg | ORAL_TABLET | Freq: Once | ORAL | Status: AC
  Administered 2017-03-21: 6 mg via ORAL
  Filled 2017-03-21: qty 1

## 2017-03-21 NOTE — Evaluation (Signed)
Physical Therapy Evaluation Patient Details Name: Curtis Keller MRN: [id number] DOB: [birth year] Today's Date: 03/21/2017   History of Present Illness  Pt. with PMH of right dependent respiratory failure, requiring tracheostomy, dysphagia requiring PEG tube placement, COPD, CVA, residual weakness with contracture of the left upper and lower extremity, chronic systolic CHF, dementia, recurrent fall, recent hip fracture with surgery, LV apical thrombus; admitted on 03/17/2017, presented with complaint of hypoxia, was found to healthcare associated pneumonia.  Clinical Impression  Patient presents with limitations in mobility due to weakness, L hemiparesis with abnormal tone and decreased ROM.  He will benefit from skilled PT to progress mobility as tolerated with recent L hip hemiarthroplasty and to allow for OOB when stable to aid in management of secretions and respiratory status.  Recommend return to SNF level rehab at d/c.    Follow Up Recommendations SNF;Supervision/Assistance - 24 hour    Equipment Recommendations  None recommended by PT    Recommendations for Other Services       Precautions / Restrictions Precautions Precautions: Fall Precaution Comments: L hip hemi 02/10/17 from injury 2/18      Mobility  Bed Mobility Overal bed mobility: Needs Assistance Bed Mobility: Supine to Sit;Sit to Supine     Supine to sit: Max assist;+2 for physical assistance Sit to supine: Max assist;+2 for physical assistance   General bed mobility comments: pt able to scoot hips pushing through R LE, pulls up to sit with R UE, another person to assist with painful L LE; to supine, assist for trunk and L LE  Transfers Overall transfer level: Needs assistance   Transfers: Lateral/Scoot Transfers          Lateral/Scoot Transfers: Max assist;+2 physical assistance General transfer comment: scooting L to get close to [employer] multiple scoots, pt assisting some with R UE and  LE  Ambulation/Gait                Stairs            Wheelchair Mobility    Modified Rankin (Stroke Patients Only)       Balance Overall balance assessment: Needs assistance Sitting-balance support: Feet supported;Single extremity supported Sitting balance-Leahy Scale: Poor Sitting balance - Comments: when holding foot board with R UE able to sit with S, otherwise needs min A to mod A for L lateral and anterior lean                                     Pertinent Vitals/Pain Pain Assessment: Faces Faces Pain Scale: Hurts even more Pain Location: L hip with mobility Pain Descriptors / Indicators: Discomfort;Grimacing;Guarding Pain Intervention(s): Monitored during session;Limited activity within patient's tolerance;Repositioned    Home Living Family/patient expects to be discharged to:: Skilled nursing facility                 Additional Comments: [facility]    Prior Function Level of Independence: Needs assistance   Gait / Transfers Assistance Needed: unknown prior level of function           Hand Dominance        Extremity/Trunk Assessment   Upper Extremity Assessment Upper Extremity Assessment: LUE deficits/detail LUE Deficits / Details: tight with flexor tone and contracted fingers and shoulder    Lower Extremity Assessment Lower Extremity Assessment: LLE deficits/detail LLE Deficits / Details: increased flexor tone, unable to extend knee more than about 60 degrees  due to pain, guarding, and limited hip extension, no active movement noted in foot       Communication   Communication: Tracheostomy  Cognition Arousal/Alertness: Awake/alert Behavior During Therapy: WFL for tasks assessed/performed Overall Cognitive Status: Difficult to assess                                 General Comments: follows commands well      General Comments General comments (skin integrity, edema, etc.): pt with knee pad due  to flexion contracture, noted fisted hand and was able to come out with time and stretching, reports doesn't have splint    Exercises General Exercises - Upper Extremity Shoulder Flexion: PROM;Left;5 reps;Supine Elbow Flexion: PROM;Left;5 reps;Supine Elbow Extension: PROM;Left;5 reps;Supine Wrist Extension: PROM;Left;Supine Composite Extension: PROM;Left;Supine General Exercises - Lower Extremity Heel Slides: PROM;Left;5 reps;Supine   Assessment/Plan    PT Assessment Patient needs continued PT services  PT Problem List Decreased strength;Decreased range of motion;Decreased mobility;Decreased activity tolerance;Decreased balance;Decreased knowledge of use of DME;Pain       PT Treatment Interventions DME instruction;Functional mobility training;Therapeutic activities;Therapeutic exercise;Balance training;Neuromuscular re-education;Patient/family education;Wheelchair mobility training    PT Goals (Current goals can be found in the Care Plan section)  Acute Rehab PT Goals Patient Stated Goal: to move PT Goal Formulation: With patient Time For Goal Achievement: 04/04/17 Potential to Achieve Goals: Fair    Frequency Min 2X/week   Barriers to discharge        Co-evaluation               AM-PAC PT "6 Clicks" Daily Activity  Outcome Measure Difficulty turning over in bed (including adjusting bedclothes, sheets and blankets)?: Total   Difficulty sitting down on and standing up from a chair with arms (e.g., wheelchair, bedside commode, etc,.)?: Total Help needed moving to and from a bed to chair (including a wheelchair)?: Total Help needed walking in hospital room?: Total Help needed climbing 3-5 steps with a railing? : Total 6 Click Score: 5    End of Session   Activity Tolerance: Patient limited by pain Patient left: in bed;with call bell/phone within reach;with bed alarm set   PT Visit Diagnosis: Muscle weakness (generalized) (M62.81)    Time: 1610-9604 PT Time  Calculation (min) (ACUTE ONLY): 33 min   Charges:   PT Evaluation $PT Eval Moderate Complexity: 1 Procedure PT Treatments $Therapeutic Activity: 8-22 mins   PT G CodesSheran Lawless, [place] [phone] 03/21/2017   Elray Mcgregor 03/21/2017, 2:16 PM

## 2017-03-21 NOTE — Progress Notes (Signed)
[facility] Progress Note  Patient: Curtis Keller WUJ:[id number]   PCP: Kirt Boys, DO DOB: 12/02/1955   DOA: 03/17/2017   DOS: 03/21/2017   Date of Service: the patient was seen and examined on 03/21/2017  Subjective: Continues to tolerate chest physical therapy, On 50% FiO2 via trach collar. No fever no chills reported. No other acute events overnight.  Brief hospital course: Pt. with PMH of right dependent respiratory failure, requiring tracheostomy, dysphagia requiring PEG tube placement, COPD, CVA, residual weakness with contracture of the left upper and lower extremity, chronic systolic CHF, dementia, recurrent fall, recent hip fracture with surgery, LV apical thrombus; admitted on 03/17/2017, presented with complaint of hypoxia, was found to healthcare associated pneumonia. Currently further plan is continue IV antibiotics.  Assessment and Plan: 1. Healthcare associated pneumonia. Likely aspiration event. Acute on chronic respiratory failure with hypoxia requiring increase FiO2. S/P tracheostomy for vent dependent respiratory failure.  Currently requiring 40% FiO2 on trach collar. Recently discharged to 25% [facility]. this is a clear evidence of aspiration pneumonia at that the patient cannot tolerate anything by mouth. Currently I would maintain strict nothing by mouth for the patient and will use only G-tube for medications. Tube feeding has been initiated, I would be cautious with residue. Clear instruction were given to nursing staff as well as family. I feel that the patient is not a candidate for by mouth intake given his recurrent evidence of aspiration. Started on IV vancomycin and cefepime. Blood cultures negative. I would transition him to IV Unasyn at present. He continues to do good on Unasyn tomorrow can be transitioned to Augmentin and complete 7 day treatment course. Continue suctioning when necessary, Robinul ordered for secretion. Leukocytosis getting  better. PTOT recommends SNF, case manager consulted for LTAC referral.  2. Chronic anticoagulation for LV apical mural thrombus. Acute on Chronic systolic CHF. Patient not on any diuretics at home. On Lopressor 25 mg twice a day which is currently on hold in the setting of infection. Does not appear volume overloaded. On warfarin, INR subtherapeutic. Required bridging.  From the review of chart, [facility] was [facility] with L[facility] with bridging, given his chronic kidney disease I gave him heparin while the patient is a hospital for bridging.  Currently INR therapeutic, transition back to Coumadin only. Starting the patient on oral Lasix 20 mg daily  3. Chronic kidney disease stage 3-4. BPH Renal function stable, will continue to monitor. Vancomycin dosing is adjusted per pharmacy. Cannot provide Flomax as it cannot be crushed. Can be started on finasteride.  4. Mood disorder. Dementia. Continuing home regimen via G-tube. Continue Depakote.  5. Right upper extr swelling Present on admission, mild worsening  Elevate extremity, vascular Doppler negative for any DVT.  6. Pressure injury, stage II, present on admission. Bilateral Buttocks Continue foam dressing. Frequent turning  Diet: Tube feeding, nothing by mouth DVT Prophylaxis: on therapeutic anticoagulation.  Advance goals of care discussion: Full code  Family Communication: no family was present at bedside, at the time of interview.   Disposition:  Discharge to SNF versus LTAC.  Consultants: none Procedures: none  Antibiotics: Anti-infectives    Start     Dose/Rate Route Frequency Ordered Stop   03/21/17 0600  ampicillin-sulbactam (UNASYN) 1.5 g in sodium chloride 0.9 % 50 mL IVPB     1.5 g 100 mL/hr over 30 Minutes Intravenous Every 8 hours 03/20/17 1247     03/19/17 0400  ceFEPIme (MAXIPIME) 2 g in dextrose 5 %  50 mL IVPB  Status:  Discontinued     2 g 100 mL/hr over 30 Minutes Intravenous Every  24 hours 03/18/17 1137 03/20/17 1240   03/18/17 1100  vancomycin (VANCOCIN) IVPB 750 mg/150 ml premix  Status:  Discontinued     750 mg 150 mL/hr over 60 Minutes Intravenous Every 12 hours 03/18/17 1025 03/20/17 1116   03/17/17 1715  vancomycin (VANCOCIN) 2,000 mg in sodium chloride 0.9 % 500 mL IVPB  Status:  Discontinued     2,000 mg 250 mL/hr over 120 Minutes Intravenous  Once 03/17/17 1702 03/18/17 1024   03/17/17 1645  ceFEPIme (MAXIPIME) 1 g in dextrose 5 % 50 mL IVPB  Status:  Discontinued     1 g 100 mL/hr over 30 Minutes Intravenous Every 8 hours 03/17/17 1643 03/18/17 1137       Objective: Physical Exam: Vitals:   03/21/17 0736 03/21/17 0900 03/21/17 1227 03/21/17 1546  BP: 108/81  (!) 125/107   Pulse: 87 94 94   Resp: 17 19    Temp: 98.8 F (37.1 C)  98.4 F (36.9 C) 98.2 F (36.8 C)  TempSrc: Axillary  Axillary Axillary  SpO2: 97% 96% 93%   Weight:        Intake/Output Summary (Last 24 hours) at 03/21/17 1649 Last data filed at 03/21/17 1500  Gross per 24 hour  Intake             1730 ml  Output             1575 ml  Net              155 ml   Filed Weights   03/19/17 0522 03/20/17 0332 03/21/17 0300  Weight: 99 kg (218 lb 4.8 oz) 97.4 kg (214 lb 11.7 oz) 97.8 kg (215 lb 9.8 oz)   General: Alert, Awake and Oriented to Place and Person. Appear in mild distress, affect flat Eyes: PERRL, Conjunctiva normal ENT: Oral Mucosa clear moist. Neck: difficult to assess JVD, no Abnormal Mass Or lumps Cardiovascular: S1 and S2 Present, no Murmur, Peripheral Pulses Present Respiratory: normal respiratory effort, Bilateral Air entry equal and Decreased, no use of accessory muscle, basal Crackles, no wheezes Abdomen: Bowel Sound present, Soft and no tenderness, Skin: no redness, no Rash, no induration Extremities: trace Pedal edema, no calf tenderness Neurologic: Grossly no focal neuro deficit chronic left hemiparesis Data Reviewed: CBC:  Recent Labs Lab 03/17/17 1413  03/18/17 0353 03/19/17 0849 03/19/17 1339 03/20/17 0135 03/21/17 0519  WBC 14.6* 15.4* 17.1*  --  13.2* 12.1*  NEUTROABS 8.2*  --   --  9.1*  --   --   HGB 11.8* 9.9* 9.8*  --  9.7* 10.1*  HCT 36.9* 32.0* 31.7*  --  31.7* 32.6*  MCV 101.4* 103.2* 102.6*  --  102.6* 103.5*  PLT 223 224 243  --  280 310   Basic Metabolic Panel:  Recent Labs Lab 03/17/17 1413 03/18/17 0353 03/19/17 0849 03/20/17 0135 03/21/17 0519  NA 139 141 142 142 141  K 3.6 3.3* 3.5 3.3* 5.1  CL 102 107 109 108 108  CO2 28 27 27 29 25   GLUCOSE 130* 112* 164* 151* 145*  BUN 48* 45* 46* 46* 50*  CREATININE 2.04* 2.06* 1.82* 1.83* 1.82*  CALCIUM 10.5* 9.9 9.6 9.5 9.4  MG  --   --  2.4  --   --     Liver Function Tests:  Recent Labs Lab 03/19/17 1339  AST 31  ALT 21  ALKPHOS 86  BILITOT 0.2*  PROT 7.1  ALBUMIN 1.9*   No results for input(s): LIPASE, AMYLASE in the last 168 hours.  Recent Labs Lab 03/20/17 0135  AMMONIA 37*   Coagulation Profile:  Recent Labs Lab 03/16/17 0537 03/18/17 0353 03/19/17 0541 03/20/17 0135 03/21/17 0519  INR 1.62 1.51 2.07 2.45 2.46   Cardiac Enzymes: No results for input(s): CKTOTAL, CKMB, CKMBINDEX, TROPONINI in the last 168 hours. BNP (last 3 results) No results for input(s): PROBNP in the last 8760 hours. CBG:  Recent Labs Lab 03/20/17 2301 03/21/17 0329 03/21/17 0735 03/21/17 1226 03/21/17 1548  GLUCAP 168* 143* 169* 118* 163*   Studies: No results found.  Scheduled Meds: . aspirin  81 mg Per Tube Daily  . atorvastatin  40 mg Per Tube QHS  . Chlorhexidine Gluconate Cloth  6 each Topical Q0600  . feeding supplement (PRO-STAT SUGAR FREE 64)  30 mL Per Tube BID  . FLUoxetine  60 mg Per Tube Daily  . furosemide  20 mg Per Tube Daily  . furosemide  20 mg Intravenous Daily  . insulin aspart  0-15 Units Subcutaneous Q4H  . metoCLOPramide  5 mg Per Tube TID AC  . mupirocin ointment  1 application Nasal BID  . OLANZapine  5 mg Per Tube BID   . sodium chloride flush  3 mL Intravenous Q12H  . Valproate Sodium  250 mg Per Tube TID  . warfarin  6 mg Oral ONCE-1800  . Warfarin - Pharmacist Dosing Inpatient   Does not apply q1800   Continuous Infusions: . sodium chloride 250 mL (03/20/17 1900)  . ampicillin-sulbactam (UNASYN) IV Stopped (03/21/17 1330)  . feeding supplement (OSMOLITE 1.5 CAL) 1,000 mL (03/21/17 1336)   PRN Meds: sodium chloride, oxyCODONE-acetaminophen, sodium chloride flush  Time spent: 35 minutes  Author: Lynden Oxford, MD [facility] Pager: [phone] 03/21/2017 4:49 PM  If 7PM-7AM, please contact night-coverage at [email or web address], password [facility]

## 2017-03-21 NOTE — Progress Notes (Signed)
*  PRELIMINARY RESULTS* Vascular Ultrasound Right upper extremity venous duplex has been completed.  Preliminary findings: The visualized veins of the right upper extremity appear negative for deep and superficial vein thrombosis.  Curtis Keller [name] 03/21/2017, 11:02 AM

## 2017-03-21 NOTE — Progress Notes (Signed)
ANTICOAGULATION CONSULT NOTE  Pharmacy Consult for Warfarin Indication: history of mural apical thrombus  Patient Measurements: Weight: 215 lb 9.8 oz (97.8 kg)  Ideal body weight: 82.2 kg (181 lb 3.5 oz) Adjusted ideal body weight: 88.4 kg (194 lb 15.6 oz)  Heparin Wt: 102 kg  Vital Signs: Temp: 98.8 F (37.1 C) (05/29 0736) Temp Source: Axillary (05/29 0736) BP: 108/81 (05/29 0736) Pulse Rate: 87 (05/29 0736)  Labs:  Recent Labs  03/18/17 2041 03/19/17 0541  03/19/17 0849 03/20/17 0135 03/21/17 0519  HGB  --   --   < > 9.8* 9.7* 10.1*  HCT  --   --   --  31.7* 31.7* 32.6*  PLT  --   --   --  243 280 310  LABPROT  --  23.6*  --   --  27.0* 27.1*  INR  --  2.07  --   --  2.45 2.46  HEPARINUNFRC <0.10* 0.16*  --   --   --   --   CREATININE  --   --   --  1.82* 1.83* 1.82*  < > = values in this interval not displayed.  Estimated Creatinine Clearance: 48.9 mL/min (A) (by C-G formula based on SCr of 1.82 mg/dL (H)).  Assessment: 56 YOM with complicated medical history of cerebral infarction with trach and g-tube. History of apical thrombus on chronic coumadin. Pharmacy consulted to dose warfarin (s/p heparin bridge d/c'd on 5/27).   INR therapeutic at 2.46 with INR goal of 2-3. CBC low but stable. No overt s/sx of bleeding noted.  Goal of Therapy:  INR 2-3 Monitor platelets by anticoagulation protocol: Yes   Plan:  Warfarin 6 mg x 1 dose at 1800 today Daily INR, CBC as needed Monitor for s/s bleeding   Estella Husk, Pharm.D., BCPS, AAHIVP Clinical Pharmacist Phone: [phone] or [phone] 03/21/2017, 9:06 AM

## 2017-03-22 ENCOUNTER — Inpatient Hospital Stay (HOSPITAL_COMMUNITY): Payer: Medicare Other

## 2017-03-22 DIAGNOSIS — Z931 Gastrostomy status: Secondary | ICD-10-CM

## 2017-03-22 DIAGNOSIS — R0602 Shortness of breath: Secondary | ICD-10-CM | POA: Diagnosis not present

## 2017-03-22 DIAGNOSIS — J690 Pneumonitis due to inhalation of food and vomit: Principal | ICD-10-CM

## 2017-03-22 DIAGNOSIS — I5023 Acute on chronic systolic (congestive) heart failure: Secondary | ICD-10-CM

## 2017-03-22 LAB — BASIC METABOLIC PANEL
Anion gap: 7 (ref 5–15)
BUN: 52 mg/dL — AB (ref 6–20)
CALCIUM: 9.5 mg/dL (ref 8.9–10.3)
CO2: 29 mmol/L (ref 22–32)
CREATININE: 1.78 mg/dL — AB (ref 0.61–1.24)
Chloride: 106 mmol/L (ref 101–111)
GFR calc Af Amer: 45 mL/min — ABNORMAL LOW (ref >60)
GFR calc non Af Amer: 39 mL/min — ABNORMAL LOW (ref >60)
GLUCOSE: 120 mg/dL — AB (ref 65–99)
Potassium: 3.9 mmol/L (ref 3.5–5.1)
Sodium: 142 mmol/L (ref 135–145)

## 2017-03-22 LAB — CBC
HCT: 32.1 % — ABNORMAL LOW (ref 39.0–52.0)
Hemoglobin: 10 g/dL — ABNORMAL LOW (ref 13.0–17.0)
MCH: 32.6 pg (ref 26.0–34.0)
MCHC: 31.2 g/dL (ref 30.0–36.0)
MCV: 104.6 fL — ABNORMAL HIGH (ref 78.0–100.0)
Platelets: 312 10*3/uL (ref 150–400)
RBC: 3.07 MIL/uL — ABNORMAL LOW (ref 4.22–5.81)
RDW: 15.7 % — AB (ref 11.5–15.5)
WBC: 11.3 10*3/uL — ABNORMAL HIGH (ref 4.0–10.5)

## 2017-03-22 LAB — CULTURE, BLOOD (ROUTINE X 2)
Culture: NO GROWTH
Culture: NO GROWTH
SPECIAL REQUESTS: ADEQUATE
Special Requests: ADEQUATE

## 2017-03-22 LAB — GLUCOSE, CAPILLARY
GLUCOSE-CAPILLARY: 79 mg/dL (ref 65–99)
Glucose-Capillary: 132 mg/dL — ABNORMAL HIGH (ref 65–99)
Glucose-Capillary: 163 mg/dL — ABNORMAL HIGH (ref 65–99)
Glucose-Capillary: 120 mg/dL — ABNORMAL HIGH (ref 65–99)
Glucose-Capillary: 154 mg/dL — ABNORMAL HIGH (ref 65–99)
Glucose-Capillary: 142 mg/dL — ABNORMAL HIGH (ref 65–99)

## 2017-03-22 LAB — PROTIME-INR
INR: 2.63
Prothrombin Time: 28.6 seconds — ABNORMAL HIGH (ref 11.4–15.2)

## 2017-03-22 MED ORDER — WARFARIN SODIUM 6 MG PO TABS
6.0000 mg | ORAL_TABLET | Freq: Once | ORAL | Status: AC
  Administered 2017-03-22: 6 mg via ORAL
  Filled 2017-03-22: qty 1

## 2017-03-22 NOTE — Progress Notes (Signed)
PROGRESS NOTE   Curtis Keller  OZH:[id number]    DOB: [birth year]    DOA: 03/17/2017  PCP: Kirt Boys, DO   I have briefly reviewed patients previous medical records in [facility].  Brief Narrative:  61 year old male with PMH of VDRF, tracheostomy dependent, on oxygen via trach collar FiO2 28% PTA, dysphagia s/p PEG tube feeding, COPD, CVA with residual left hemiparesis/contractures, chronic systolic CHF, dementia, recurrent falls, recent hip fracture surgery, LV apical thrombus, presented with hypoxia and noted to have suspected aspiration pneumonia. Slowly improving.   Assessment & Plan:   Active Problems:   Heart failure, chronic systolic (HCC)   Depression   Apical mural thrombus   Protein calorie malnutrition (HCC)   PEG (percutaneous endoscopic gastrostomy) status (HCC)   COPD (chronic obstructive pulmonary disease) (HCC)   GERD (gastroesophageal reflux disease)   Diabetes mellitus, type II, insulin dependent (HCC)   Pneumonia   HCAP (healthcare-associated pneumonia)   Pressure injury of skin   1. Aspiration pneumonia: Likely related to dysphagia from prior CVA. Initially started on IV vancomycin and cefepime which was changed to IV Unasyn. Patient asking to drink but should remain strict NPO due to high aspiration risk. Strict aspiration precautions. Aggressive pulmonary toilet by RRT. Tracheal aspirate showed moderate Candida. Blood cultures 2: Negative to date. Chest x-ray personally reviewed and shows improving pulmonary edema. 2. Acute on chronic hypoxic respiratory failure/s/p tracheostomy from recent VDRF: Apparently was on trach collar with FiO2 of 28% prior to admission. As per family, recently [facility] and was only at [facility] for a few hours prior to deterioration and hospitalization again. Acute respiratory failure secondary to aspiration pneumonia and decompensated CHF. 3. Acute on chronic systolic CHF: Not on diuretics PTA. Although  clinically does not appear volume overloaded, chest x-ray seems to suggest pulmonary edema which is slowly improving. Continue Lasix IV Lasix 20 MG daily for another day or 2 prior to transitioning to oral. Metoprolol on hold due to soft blood pressures. 4. LV apical mural thrombus: INR was initially subtherapeutic and required heparin bridge. INR now therapeutic at 2.63 and being managed by pharmacy. 5. Stage III chronic kidney disease: Creatinine stable in the 1.7-1.8 range. Periodically monitor BMP. 6. Mood disorder/dementia: Continue Depakote, Zyprexa and fluoxetine. Mental status probably at baseline. 7. RUE swelling: Venous Doppler 5/29: No DVT or SVT. 8. Stage II pressure injury on bilateral buttocks: per WOC consult. 9. Hypokalemia: Replaced. 10. Anemia, chronic: Macrocytic. May be of chronic disease. Stable. 11. CVA with received dual left hemiparesis: Continue aspirin, statins, warfarin anticoagulation. 12. Hyperglycemia: Without diagnosis of DM. Last A1c 02/06/16:5.2. On SSI. Check A1c. Reasonable inpatient control.   DVT prophylaxis: Anticoagulated on warfarin Code Status: Full Family Communication: Discussed in detail with patient's sister. Updated care and answered questions. Disposition: To be determined,? LTAC versus SNF.   Consultants:  None   Procedures:  Has tracheostomy and PEG tube since prior to admission.  Antimicrobials:  IV Unasyn    Subjective: Seen this morning. Interviewed and examined along with patient's RN. Makes signs that he wants to drink something. Denies dyspnea or pain. As per RN, thick mild secretions and at times desaturates.  ROS: No nausea, vomiting or dysuria reported.  Objective:  Vitals:   03/22/17 0729 03/22/17 0740 03/22/17 1136 03/22/17 1156  BP: 116/84 116/84 118/81 118/81  Pulse:      Resp:      Temp: 97.9 F (36.6 C)  97.6 F (36.4 C)  TempSrc: Axillary  Axillary   SpO2:  91%    Weight:        Examination:  General  exam: Pleasant middle-aged male, chronically ill-looking, lying comfortably propped up in bed. Respiratory system: Diminished breath sounds in the bases with occasional basal crackles but otherwise clear to auscultation. Respiratory effort normal. Tracheostomy site intact. Oxygen via trach collar. Cardiovascular system: S1 & S2 heard, RRR. No JVD, murmurs, rubs, gallops or clicks. No pedal edema. Telemetry: Sinus rhythm. Gastrointestinal system: Abdomen is nondistended, soft and nontender. No organomegaly or masses felt. Normal bowel sounds heard. Central nervous system: Alert and oriented to self. Follows simple instructions. No focal neurological deficits. Extremities: Right limbs grade 5 x 5 power. Left limbs at least grade 2 x 5 power but has contractures. Skin: No rashes, lesions or ulcers Psychiatry: Judgement and insight impaired. Mood & affect appropriate.     Data Reviewed: I have personally reviewed following labs and imaging studies  CBC:  Recent Labs Lab 03/17/17 1413 03/18/17 0353 03/19/17 0849 03/19/17 1339 03/20/17 0135 03/21/17 0519 03/22/17 0140  WBC 14.6* 15.4* 17.1*  --  13.2* 12.1* 11.3*  NEUTROABS 8.2*  --   --  9.1*  --   --   --   HGB 11.8* 9.9* 9.8*  --  9.7* 10.1* 10.0*  HCT 36.9* 32.0* 31.7*  --  31.7* 32.6* 32.1*  MCV 101.4* 103.2* 102.6*  --  102.6* 103.5* 104.6*  PLT 223 224 243  --  280 310 312   Basic Metabolic Panel:  Recent Labs Lab 03/18/17 0353 03/19/17 0849 03/20/17 0135 03/21/17 0519 03/22/17 0140  NA 141 142 142 141 142  K 3.3* 3.5 3.3* 5.1 3.9  CL 107 109 108 108 106  CO2 27 27 29 25 29   GLUCOSE 112* 164* 151* 145* 120*  BUN 45* 46* 46* 50* 52*  CREATININE 2.06* 1.82* 1.83* 1.82* 1.78*  CALCIUM 9.9 9.6 9.5 9.4 9.5  MG  --  2.4  --   --   --    Liver Function Tests:  Recent Labs Lab 03/19/17 1339  AST 31  ALT 21  ALKPHOS 86  BILITOT 0.2*  PROT 7.1  ALBUMIN 1.9*   Coagulation Profile:  Recent Labs Lab 03/18/17 0353  03/19/17 0541 03/20/17 0135 03/21/17 0519 03/22/17 0140  INR 1.51 2.07 2.45 2.46 2.63   CBG:  Recent Labs Lab 03/21/17 1915 03/21/17 2346 03/22/17 0319 03/22/17 0727 03/22/17 1134  GLUCAP 123* 134* 132* 163* 120*    Recent Results (from the past 240 hour(s))  Culture, blood (Routine X 2) w Reflex to ID Panel     Status: None (Preliminary result)   Collection Time: 03/17/17  5:27 PM  Result Value Ref Range Status   Specimen Description BLOOD RIGHT ARM  Final   Special Requests IN PEDIATRIC BOTTLE Blood Culture adequate volume  Final   Culture NO GROWTH 4 DAYS  Final   Report Status PENDING  Incomplete  Culture, blood (Routine X 2) w Reflex to ID Panel     Status: None (Preliminary result)   Collection Time: 03/17/17  5:32 PM  Result Value Ref Range Status   Specimen Description BLOOD RIGHT ARM  Final   Special Requests IN PEDIATRIC BOTTLE Blood Culture adequate volume  Final   Culture NO GROWTH 4 DAYS  Final   Report Status PENDING  Incomplete  MRSA PCR Screening     Status: Abnormal   Collection Time: 03/17/17  9:17 PM  Result  Value Ref Range Status   MRSA by PCR POSITIVE (A) NEGATIVE Final    Comment:        The GeneXpert MRSA Assay (FDA approved for NASAL specimens only), is one component of a comprehensive MRSA colonization surveillance program. It is not intended to diagnose MRSA infection nor to guide or monitor treatment for MRSA infections. RESULT CALLED TO, READ BACK BY AND VERIFIED WITH: RNJamelle Rushing [id number] @2326  THANEY   Culture, respiratory (NON-Expectorated)     Status: None   Collection Time: 03/19/17 11:55 AM  Result Value Ref Range Status   Specimen Description TRACHEAL ASPIRATE  Final   Special Requests NONE  Final   Gram Stain   Final    FEW WBC PRESENT,BOTH PMN AND MONONUCLEAR MODERATE YEAST    Culture MODERATE CANDIDA GLABRATA  Final   Report Status 03/21/2017 FINAL  Final         Radiology Studies: Dg Chest Port 1  View  Result Date: 03/22/2017 CLINICAL DATA:  Hypoxia EXAM: PORTABLE CHEST 1 VIEW COMPARISON:  03/19/2017; 03/17/2017; 03/06/2017 FINDINGS: Grossly unchanged enlarged cardiac silhouette and mediastinal contours given AP projection. Stable position of support apparatus. Pulmonary vasculature remains indistinct with cephalization of flow. Minimally improved aeration of lungs with persistent bibasilar opacities, left greater than right. No new focal airspace opacities. No definite pleural effusion or pneumothorax. No acute osseus abnormalities. IMPRESSION: 1.  Stable positioning of support apparatus.  No pneumothorax. 2. Minimally improved bibasilar atelectasis with otherwise similar findings of mild pulmonary edema. Electronically Signed   By: [name] M.D.   On: 03/22/2017 09:15        Scheduled Meds: . aspirin  81 mg Per Tube Daily  . atorvastatin  40 mg Per Tube QHS  . feeding supplement (PRO-STAT SUGAR FREE 64)  30 mL Per Tube BID  . FLUoxetine  60 mg Per Tube Daily  . furosemide  20 mg Per Tube Daily  . furosemide  20 mg Intravenous Daily  . insulin aspart  0-15 Units Subcutaneous Q4H  . metoCLOPramide  5 mg Per Tube TID AC  . mupirocin ointment  1 application Nasal BID  . OLANZapine  5 mg Per Tube BID  . sodium chloride flush  3 mL Intravenous Q12H  . Valproate Sodium  250 mg Per Tube TID  . warfarin  6 mg Oral ONCE-1800  . Warfarin - Pharmacist Dosing Inpatient   Does not apply q1800   Continuous Infusions: . sodium chloride 250 mL (03/20/17 1900)  . ampicillin-sulbactam (UNASYN) IV Stopped (03/22/17 0612)  . feeding supplement (OSMOLITE 1.5 CAL) 1,000 mL (03/22/17 1040)     LOS: 4 days     Abhimanyu Cruces, MD, FACP, FHM. [facility] Pager [phone]  If 7PM-7AM, please contact night-coverage [email or web address] Password [email or web address] 03/22/2017, 1:12 PM

## 2017-03-22 NOTE — Progress Notes (Signed)
ANTICOAGULATION CONSULT NOTE  Pharmacy Consult for Warfarin Indication: history of mural apical thrombus  Patient Measurements: Weight: 216 lb 11.4 oz (98.3 kg)  Ideal body weight: 82.2 kg (181 lb 3.5 oz) Adjusted ideal body weight: 88.6 kg (195 lb 6.7 oz)  Heparin Wt: 102 kg  Vital Signs: Temp: 97.9 F (36.6 C) (05/30 0729) Temp Source: Axillary (05/30 0729) BP: 116/84 (05/30 0740) Pulse Rate: 87 (05/30 0320)  Labs:  Recent Labs  03/20/17 0135 03/21/17 0519 03/22/17 0140  HGB 9.7* 10.1* 10.0*  HCT 31.7* 32.6* 32.1*  PLT 280 310 312  LABPROT 27.0* 27.1* 28.6*  INR 2.45 2.46 2.63  CREATININE 1.83* 1.82* 1.78*    Estimated Creatinine Clearance: 50 mL/min (A) (by C-G formula based on SCr of 1.78 mg/dL (H)).  Assessment: 63 YOM with complicated medical history of cerebral infarction with trach and g-tube. History of apical thrombus on chronic coumadin. Pharmacy consulted to dose warfarin (s/p heparin bridge d/c'd on 5/27).   INR therapeutic at 2.63 with INR goal of 2-3. CBC low but stable. No overt s/sx of bleeding noted.  Goal of Therapy:  INR 2-3 Monitor platelets by anticoagulation protocol: Yes   Plan:  Warfarin 6 mg x 1 dose at 1800 today Daily INR, CBC as needed Monitor for s/s bleeding   Estella Husk, Pharm.D., BCPS, AAHIVP Clinical Pharmacist Phone: [phone] or [phone] 03/22/2017, 9:27 AM

## 2017-03-22 NOTE — Consult Note (Addendum)
WOC Nurse wound consult note Reason for Consult: Consult requested for buttocks.  Generalized erythremia, left buttock with partial thickness linear abrasion of unknown etiology. Pressure Injury POA: Appearance is NOT consistent with a pressure injury; it is irregular-shaped and not located over a bony prominence.  Measurement: .5X.2X.1cm Wound bed: red and moist Drainage (amount, consistency, odor) no odor or drainage Dressing proc edure/placement/frequency: foam dressing to protect and promote healing. Please re-consult if further assistance is needed.  Thank-you,  Cammie Mcgee MSN, RN, CWOCN, [place], CNS [phone]

## 2017-03-22 NOTE — Care Management Note (Addendum)
Case Management Note  Patient Details  Name: Curtis Keller MRN: [id number] Date of Birth: [birth year]  Subjective/Objective:  From SNF,  With hx of VDRF, Trac dependent on oxygen via trach collar at 28%, pta dysphagia s/p peg tube feeding, COPD, CVA with residual left hemiparesis/contractures, chronic syst chf, dementia, recurrent falls, recent hip fx surgery, LV, apical thrombus, presents with hypoxia and suspected asp pna.  CSW referral.     PCP Kirt Boys               Action/Plan: NCM will follow along with CSW for dc needs, patient had a previous [facility] stay, liason with [facility] will  see if patient meets criteria.  Per [facility]iaison, patient has used up all [facility] days.  Expected Discharge Date:                   Expected Discharge Plan:  Skilled Nursing Facility  In-House Referral:  Clinical Social Work  Discharge planning Services  CM Consult  Post Acute Care Choice:    Choice offered to:     DME Arranged:    DME Agency:     HH Arranged:    HH Agency:     Status of Service:  Completed, signed off  If discussed at [employer], dates discussed:    Additional Comments:  [name], RN 03/22/2017, 3:04 PM

## 2017-03-22 NOTE — Progress Notes (Signed)
During trach check had to lavage and suction patient several times changed out inner cannula sp02 was 82-85% had to increase fi02 to 100% until pt gets over this lil hump RT and RN will monitor RN at bedside with RT during this time

## 2017-03-22 NOTE — Progress Notes (Signed)
Manual CPT done at this time. Patient tolerated well. Able to wean FIO2 to 40%

## 2017-03-23 LAB — CBC
HCT: 34.4 % — ABNORMAL LOW (ref 39.0–52.0)
HEMOGLOBIN: 10.5 g/dL — AB (ref 13.0–17.0)
MCH: 31.5 pg (ref 26.0–34.0)
MCHC: 30.5 g/dL (ref 30.0–36.0)
MCV: 103.3 fL — ABNORMAL HIGH (ref 78.0–100.0)
PLATELETS: 327 10*3/uL (ref 150–400)
RBC: 3.33 MIL/uL — AB (ref 4.22–5.81)
RDW: 15.4 % (ref 11.5–15.5)
WBC: 12 10*3/uL — ABNORMAL HIGH (ref 4.0–10.5)

## 2017-03-23 LAB — GLUCOSE, CAPILLARY
GLUCOSE-CAPILLARY: 152 mg/dL — AB (ref 65–99)
GLUCOSE-CAPILLARY: 106 mg/dL — AB (ref 65–99)
GLUCOSE-CAPILLARY: 124 mg/dL — AB (ref 65–99)
GLUCOSE-CAPILLARY: 132 mg/dL — AB (ref 65–99)
GLUCOSE-CAPILLARY: 170 mg/dL — AB (ref 65–99)
Glucose-Capillary: 147 mg/dL — ABNORMAL HIGH (ref 65–99)

## 2017-03-23 LAB — BASIC METABOLIC PANEL
Anion gap: 7 (ref 5–15)
BUN: 55 mg/dL — AB (ref 6–20)
CHLORIDE: 104 mmol/L (ref 101–111)
CO2: 30 mmol/L (ref 22–32)
CREATININE: 1.8 mg/dL — AB (ref 0.61–1.24)
Calcium: 9.4 mg/dL (ref 8.9–10.3)
GFR, EST AFRICAN AMERICAN: 45 mL/min — AB (ref >60)
GFR, EST NON AFRICAN AMERICAN: 39 mL/min — AB (ref >60)
Glucose, Bld: 134 mg/dL — ABNORMAL HIGH (ref 65–99)
POTASSIUM: 4 mmol/L (ref 3.5–5.1)
SODIUM: 141 mmol/L (ref 135–145)

## 2017-03-23 LAB — HEMOGLOBIN A1C
Hgb A1c MFr Bld: 5.3 % (ref 4.8–5.6)
Mean Plasma Glucose: 105 mg/dL

## 2017-03-23 LAB — PROTIME-INR
INR: 2.75
Prothrombin Time: 29.6 seconds — ABNORMAL HIGH (ref 11.4–15.2)

## 2017-03-23 MED ORDER — CHLORHEXIDINE GLUCONATE 0.12 % MT SOLN
15.0000 mL | Freq: Two times a day (BID) | OROMUCOSAL | Status: DC
  Administered 2017-03-23 – 2017-03-30 (×15): 15 mL via OROMUCOSAL
  Filled 2017-03-23 (×14): qty 15

## 2017-03-23 MED ORDER — WARFARIN SODIUM 6 MG PO TABS
6.0000 mg | ORAL_TABLET | Freq: Once | ORAL | Status: AC
  Administered 2017-03-23: 6 mg via ORAL
  Filled 2017-03-23: qty 1

## 2017-03-23 MED ORDER — ORAL CARE MOUTH RINSE
15.0000 mL | Freq: Two times a day (BID) | OROMUCOSAL | Status: DC
  Administered 2017-03-23 – 2017-03-30 (×14): 15 mL via OROMUCOSAL

## 2017-03-23 MED ORDER — FUROSEMIDE 10 MG/ML IJ SOLN
20.0000 mg | Freq: Once | INTRAMUSCULAR | Status: AC
  Administered 2017-03-23: 20 mg via INTRAVENOUS
  Filled 2017-03-23: qty 2

## 2017-03-23 NOTE — Clinical Social Work Note (Signed)
Clinical Social Work Assessment  Patient Details  Name: Curtis Keller MRN: [id number] Date of Birth: [birth year]  Date of referral:  03/23/17               Reason for consult:  Discharge Planning                Permission sought to share information with:  Facility Medical sales representative, Family Supports Permission granted to share information::  Yes, Verbal Permission Granted  Name::     Sueanne Margarita  Agency::  [facility]  Relationship::  Sister  Contact Information:  [phone]  Housing/Transportation Living arrangements for the past 2 months:  Skilled Building surveyor of Information:  Medical Team, Other (Comment Required) (Sister) Patient Interpreter Needed:  None Criminal Activity/Legal Involvement Pertinent to Current Situation/Hospitalization:  No - Comment as needed Significant Relationships:  Adult Children, Siblings Lives with:  Facility Resident Do you feel safe going back to the place where you live?  Yes Need for family participation in patient care:  Yes (Comment)  Care giving concerns:  Patient was admitted from [facility] SNF. PT recommending SNF once medically stable for discharge.   Social Worker assessment / plan:  RN unable to assess orientation due to tracheostomy. No supports at bedside. CSW called patient's sister as CSW has been unable to get in touch with patient's daughter, Luster Landsberg and his sister is listed first in emergency contacts. Patient's sister stated that she is the patient's durable POA but he has no HCPOA. She stated she does have the paperwork but needs for it to be notarized before she files it. Patient's sister confirmed that patient was admitted from [facility] and the plan is for him to return when stable. Patient's trach oxygen requirements are currently at 35%. SNF requires a maximum of 28%. CSW inquired about patient's two daughters. She stated that she has not spoken to them since around March 2018. No further concerns. CSW  encouraged patient's sister to contact CSW as needed. CSW will continue to follow patient and his sister for support and facilitate discharge back to SNF once medically stable.  Employment status:  Disabled (Comment on whether or not currently receiving Disability) Insurance information:  Medicare PT Recommendations:  Skilled Nursing Facility Information / Referral to community resources:  Skilled Nursing Facility  Patient/Family's Response to care:  RN unable to assess patient's orientation. Patient's sister agreeable to return to SNF. Patient's sister and brother-in-law supportive and involved in patient's care. Patient's sister appreciated social work intervention.  Patient/Family's Understanding of and Emotional Response to Diagnosis, Current Treatment, and Prognosis:  RN unable to assess patient's oriented. Patient's sister has a very good understanding of what led to patient's admission and states that she is an Charity fundraiser. She understands his need to return to SNF once stable for discharge. Patient's sister appears happy with hospital care.  Emotional Assessment Appearance:  Appears stated age Attitude/Demeanor/Rapport:  Unable to Assess Affect (typically observed):  Unable to Assess Orientation:   (Unable to assess due to trach) Alcohol / Substance use:  Never Used Psych involvement (Current and /or in the community):  No (Comment)  Discharge Needs  Concerns to be addressed:  Care Coordination Readmission within the last 30 days:  No Current discharge risk:  Dependent with Mobility Barriers to Discharge:  Continued Medical Work up, Other (Oxygen requirements on trach)   Margarito Liner, LCSW 03/23/2017, 1:44 PM

## 2017-03-23 NOTE — Progress Notes (Signed)
ANTICOAGULATION CONSULT NOTE  Pharmacy Consult for Warfarin Indication: history of mural apical thrombus  Patient Measurements: Height: 6\' 2"  (188 cm) Weight: 255 lb 11.7 oz (116 kg) IBW/kg (Calculated) : 82.2  Ideal body weight: 82.2 kg (181 lb 3.5 oz) Adjusted ideal body weight: 95.7 kg (211 lb 0.4 oz)  Heparin Wt: 102 kg  Vital Signs: Temp: 97.6 F (36.4 C) (05/31 0718) Temp Source: Oral (05/31 0718) BP: 131/84 (05/31 0718) Pulse Rate: 89 (05/31 0718)  Labs:  Recent Labs  03/21/17 0519 03/22/17 0140 03/23/17 0303  HGB 10.1* 10.0* 10.5*  HCT 32.6* 32.1* 34.4*  PLT 310 312 327  LABPROT 27.1* 28.6* 29.6*  INR 2.46 2.63 2.75  CREATININE 1.82* 1.78* 1.80*    Estimated Creatinine Clearance: 57.6 mL/min (A) (by C-G formula based on SCr of 1.8 mg/dL (H)).  Assessment: 82 YOM with complicated medical history of cerebral infarction with trach and g-tube. History of apical thrombus on chronic coumadin. Pharmacy consulted to dose warfarin (s/p heparin bridge d/c'd on 5/27).   INR therapeutic at 2.75 with INR goal of 2-3. CBC low but stable. No overt s/sx of bleeding noted.  Goal of Therapy:  INR 2-3 Monitor platelets by anticoagulation protocol: Yes   Plan:  Warfarin 6 mg x 1 dose at 1800 today Daily INR, CBC as needed Monitor for s/s bleeding   Estella Husk, Pharm.D., BCPS, AAHIVP Clinical Pharmacist Phone: [phone] or [phone] 03/23/2017, 10:43 AM

## 2017-03-23 NOTE — Progress Notes (Signed)
Physical Therapy Treatment Patient Details Name: Curtis Keller MRN: [id number] DOB: [birth year] Today's Date: 03/23/2017    History of Present Illness Pt. with PMH of right dependent respiratory failure, requiring tracheostomy, dysphagia requiring PEG tube placement, COPD, CVA, residual weakness with contracture of the left upper and lower extremity, chronic systolic CHF, dementia, recurrent fall, recent hip fracture with surgery, LV apical thrombus; admitted on 03/17/2017, presented with complaint of hypoxia, was found to healthcare associated pneumonia.    PT Comments    Patient recently back to bed via maxi move per RN. Pt performed R LE therex with cues and min assist at times.  Continue to progress as tolerated with anticipated d/c to SNF for further skilled PT services.    Follow Up Recommendations  SNF;Supervision/Assistance - 24 hour     Equipment Recommendations  None recommended by PT    Recommendations for Other Services       Precautions / Restrictions Precautions Precautions: Fall Precaution Comments: L hip hemi 02/10/17 from injury 2/18    Mobility  Bed Mobility                  Transfers                    Ambulation/Gait                 Stairs            Wheelchair Mobility    Modified Rankin (Stroke Patients Only)       Balance                                            Cognition Arousal/Alertness: Awake/alert Behavior During Therapy: WFL for tasks assessed/performed Overall Cognitive Status: Difficult to assess                                 General Comments: follows commands well      Exercises General Exercises - Lower Extremity Ankle Circles/Pumps: AROM;Right;20 reps Quad Sets: AROM;Right;15 reps Short Arc Quad: AROM;AAROM;Right;10 reps Heel Slides: AROM;Right;15 reps Hip ABduction/ADduction: AROM;Right;15 reps Straight Leg Raises: AROM;AAROM;Right;5 reps    General  Comments General comments (skin integrity, edema, etc.): L knee pad for flexion contracture      Pertinent Vitals/Pain Pain Assessment: Faces Faces Pain Scale: Hurts even more Pain Location: L hip with flexion Pain Descriptors / Indicators: Discomfort;Grimacing;Guarding Pain Intervention(s): Limited activity within patient's tolerance;Monitored during session;Repositioned    Home Living                      Prior Function            PT Goals (current goals can now be found in the care plan section) Progress towards PT goals: Progressing toward goals    Frequency    Min 2X/week      PT Plan Current plan remains appropriate    Co-evaluation              AM-PAC PT "6 Clicks" Daily Activity  Outcome Measure  Difficulty turning over in bed (including adjusting bedclothes, sheets and blankets)?: Total Difficulty moving from lying on back to sitting on the side of the bed? : Total Difficulty sitting down on and standing up from a chair with arms (e.g., wheelchair,  bedside commode, etc,.)?: Total Help needed moving to and from a bed to chair (including a wheelchair)?: Total Help needed walking in hospital room?: Total Help needed climbing 3-5 steps with a railing? : Total 6 Click Score: 6    End of Session Equipment Utilized During Treatment: Oxygen Activity Tolerance: Patient tolerated treatment well Patient left: in bed;with call bell/phone within reach;with bed alarm set Nurse Communication: Mobility status PT Visit Diagnosis: Muscle weakness (generalized) (M62.81)     Time: 1610-9604 PT Time Calculation (min) (ACUTE ONLY): 31 min  Charges:  $Therapeutic Exercise: 23-37 mins                    G Codes:       Erline Levine, PTA Pager: [phone]     Carolynne Edouard 03/23/2017, 4:30 PM

## 2017-03-23 NOTE — Progress Notes (Signed)
Nutrition Follow-up  DOCUMENTATION CODES:   Not applicable  INTERVENTION:    Continue Osmolite 1.5 at 60 ml/h via PEG with Pro-stat 30 ml BID to provide 2360 kcals, 120 gm protein, 1097 ml free water daily.  NUTRITION DIAGNOSIS:   Inadequate oral intake related to inability to eat as evidenced by NPO status.  Ongoing  GOAL:   Patient will meet greater than or equal to 90% of their needs  Met with TF  MONITOR:   TF tolerance, Weight trends, Labs, I & O's  REASON FOR ASSESSMENT:   Malnutrition Screening Tool, Consult Enteral/tube feeding initiation and management  ASSESSMENT:   61 yo male with PMH of stroke, trach, dysphagia, G-tube, CAD, CHF, dementia, HTN, HLD, ischemic cardiomyopathy, who was admitted on 5/25 with PNA.  Patient is currently receiving Osmolite 1.5 via PEG at 60 ml/h (1440 ml/day) with Prostat 30 ml BID to provide 2360 kcals, 120 gm protein, 1097 ml free water daily.  Tolerating TF well per discussion with RN. Remains NPO. Labs reviewed. CBG's: 152-106 Medications reviewed and include Reglan, Lasix. I/O + 3.1 L since admission  Diet Order:  Diet NPO time specified  Skin:   (open wound L knee, Non PI to buttocks, L hip incision)   Last BM:  5/25  Height:   Ht Readings from Last 1 Encounters:  03/23/17 _0  (1.88 m)    Weight:   Wt Readings from Last 1 Encounters:  03/23/17 255 lb 11.7 oz (116 kg)    Ideal Body Weight:  86.36 kg  BMI:  Body mass index is 32.83 kg/m.  Estimated Nutritional Needs:   Kcal:  2200-2400  Protein:  120-135 gm  Fluid:  2.2-2.4 L  EDUCATION NEEDS:   No education needs identified at this time  Molli Barrows, [place], [place], [place] Pager [phone] After Hours Pager [phone]

## 2017-03-23 NOTE — Progress Notes (Signed)
Pharmacy Antibiotic Note  Curtis Keller is a 62 y.o. male admitted on 03/17/2017 with pneumonia.  Pharmacy has been consulted for Unasyn dosing. Today is day 6 of antibiotics. Patient is afebrile, WBC down to 12, CrCl ~48 ml/min  Plan: Continue Unasyn 1.5g IV Q8H  Monitor renal function, culture results, LOT Anticipate 7-8 day course of antibiotics  Height: 6\' 2"  (188 cm) Weight: 255 lb 11.7 oz (116 kg) IBW/kg (Calculated) : 82.2  Temp (24hrs), Avg:97.8 F (36.6 C), Min:97.6 F (36.4 C), Max:98.3 F (36.8 C)   Recent Labs Lab 03/19/17 0849 03/20/17 0135 03/20/17 0949 03/21/17 0519 03/22/17 0140 03/23/17 0303  WBC 17.1* 13.2*  --  12.1* 11.3* 12.0*  CREATININE 1.82* 1.83*  --  1.82* 1.78* 1.80*  VANCOTROUGH  --   --  42*  --   --   --     Estimated Creatinine Clearance: 57.6 mL/min (A) (by C-G formula based on SCr of 1.8 mg/dL (H)).    Allergies  Allergen Reactions  . Codeine Anaphylaxis and Nausea And Vomiting  . Lisinopril Itching and Rash  . Hydrocodone Other (See Comments)    On MAR  . Hydrocodone-Acetaminophen Nausea And Vomiting    Cannot take any acetaminophen due to liver issues per sister  . Tegaderm Ag Mesh [Silver] Other (See Comments)    On MAR  . Tylenol [Acetaminophen] Nausea Only and Other (See Comments)    Cannot take any acetaminophen due to liver issues per sister    Antimicrobials this admission: Vanc 5/26 >>5/28 Cefepime 5/25 >>5/28 Unasyn 5/29>>  Dose adjustments this admission: 5/28 VT = 42 mcg/mL, hold vancomycin  Microbiology results: 5/25 MRSA PCR: positive 5/25 BCx: ngtd 5/27 RCx: pending   Estella Husk, Pharm.D., BCPS, AAHIVP Clinical Pharmacist Phone: [phone] or [phone] 03/23/2017, 10:46 AM

## 2017-03-23 NOTE — Progress Notes (Signed)
PROGRESS NOTE   Curtis Keller  BJY:[id number]    DOB: Apr 05, 1956    DOA: 03/17/2017  PCP: Kirt Boys, DO   I have briefly reviewed patients previous medical [facility] in [facility].  Brief Narrative:  61 year old male with PMH of VDRF, tracheostomy dependent, on oxygen via trach collar FiO2 28% PTA, dysphagia s/p PEG tube feeding, COPD, CVA with residual left hemiparesis/contractures, chronic systolic CHF, dementia, recurrent falls, recent hip fracture surgery, LV apical thrombus, presented with hypoxia and noted to have suspected aspiration pneumonia. Slowly improving.Still with increased oxygen demands, 10 L/m via trach collar, gradually weaning to PTA levels.   Assessment & Plan:   Active Problems:   Heart failure, chronic systolic (HCC)   Depression   Apical mural thrombus   Protein calorie malnutrition (HCC)   PEG (percutaneous endoscopic gastrostomy) status (HCC)   COPD (chronic obstructive pulmonary disease) (HCC)   GERD (gastroesophageal reflux disease)   Diabetes mellitus, type II, insulin dependent (HCC)   Pneumonia   HCAP (healthcare-associated pneumonia)   Pressure injury of skin   1. Aspiration pneumonia: Likely related to dysphagia from prior CVA and apparently was fed by mouth at SNF. Initially started on IV vancomycin and cefepime which was changed to IV Unasyn. Strict nothing by mouth and aspiration precautions. Aggressive pulmonary toilet by RRT. Tracheal aspirate showed moderate Candida. Blood cultures 2: Negative. Chest x-ray 5/30 was personally reviewed and showed improving pulmonary edema. Aim to complete total 7 days of antibiotics. 2. Acute on chronic hypoxic respiratory failure/s/p tracheostomy from recent VDRF: Apparently was on trach collar with FiO2 of 28% prior to admission. As per family, recently discharged from LTAC/[facility] and was only at [facility] for a few hours prior to deterioration and hospitalization again. Acute respiratory failure secondary to  aspiration pneumonia and decompensated CHF. Currently on 10 L/m oxygen via trach collar/40% FiO2. Wean oxygen as tolerated. 3. Acute on chronic systolic CHF: Not on diuretics PTA. Although clinically does not appear volume overloaded, chest x-ray seemed to suggest pulmonary edema which is slowly improving. Continue Lasix IV Lasix 20 MG daily for another day or 2 prior to transitioning to oral. Metoprolol on hold due to soft blood pressures. Given additional dose of IV Lasix 20 mg 1 today. 4. LV apical mural thrombus: INR was initially subtherapeutic and required heparin bridge. INR now therapeutic at 2.75 and being managed by pharmacy. 5. Stage III chronic kidney disease: Creatinine stable in the 1.7-1.8 range. Periodically monitor BMP. 6. Mood disorder/dementia: Continue Depakote, Zyprexa and fluoxetine. Mental status probably at baseline. 7. RUE swelling: Venous Doppler 5/29: No DVT or SVT. 8. Buttock lesion: As per wound care consultation 5/30, generalized erythema, left buttock with partial-thickness linear ablation of unknown etiology and the appearance is not consistent with a pressure injury. Continue foam dressing as per their recommendations. 9. Hypokalemia: Replaced. 10. Anemia, chronic: Macrocytic. May be of chronic disease. Stable. 11. CVA with received dual left hemiparesis: Continue aspirin, statins, warfarin anticoagulation. 12. Hyperglycemia: Without diagnosis of DM. On SSI. A1c 5.3.. Reasonable inpatient control.   DVT prophylaxis: Anticoagulated on warfarin Code Status: Full Family Communication: Discussed in detail with patient's sister on 5/30. None at bedside today. Updated care and answered questions. Disposition: To be determined. As per case management notes, patient is exhausted LTAC days and may need to go to SNF. Discussed with RN, strict intake output to be monitored, wean oxygen as tolerated, mobilize and out of bed to chair as tolerated, further discuss with case  management regarding discharge disposition.   Consultants:  None   Procedures:  Has tracheostomy and PEG tube since prior to admission.  Antimicrobials:  IV Unasyn    Subjective: Mild nonproductive cough. Denies dyspnea or chest pain. As per RN, no acute issues reported.  ROS: No nausea, vomiting or dysuria reported.  Objective:  Vitals:   03/23/17 0120 03/23/17 0312 03/23/17 0500 03/23/17 0718  BP:  (!) 119/91  131/84  Pulse: 77 92  89  Resp: 16 18  20   Temp:  98.3 F (36.8 C)  97.6 F (36.4 C)  TempSrc:  Axillary  Oral  SpO2: 96% 94%  93%  Weight:   97.7 kg (215 lb 6.2 oz) 116 kg (255 lb 11.7 oz)  Height:    6\' 2"  (1.88 m)    Examination:  General exam: Pleasant middle-aged male, chronically ill-looking, lying comfortably propped up in bed.He does not appear in discomfort or distress. Respiratory system: Diminished breath sounds in the bases with occasional basal crackles but otherwise clear to auscultation. Tracheostomy intact without acute findings and tracheostomy with trach collar oxygen. Cardiovascular system: S1 & S2 heard, RRR. No JVD, murmurs, rubs, gallops or clicks. No pedal edema. Telemetry: Sinus rhythm with frequent PVCs. Gastrointestinal system: Abdomen is nondistended, soft and nontender. No organomegaly or masses felt. Normal bowel sounds heard. Central nervous system: Alert and oriented to self. Follows simple instructions. No focal neurological deficits. Extremities: Right limbs grade 5 x 5 power. Left limbs at least grade 2 x 5 power but has contractures. Skin: Exam as per wound care RN on 5/30 Psychiatry: Judgement and insight impaired. Mood & affect appropriate.     Data Reviewed: I have personally reviewed following labs and imaging studies  CBC:  Recent Labs Lab 03/17/17 1413  03/19/17 0849 03/19/17 1339 03/20/17 0135 03/21/17 0519 03/22/17 0140 03/23/17 0303  WBC 14.6*  < > 17.1*  --  13.2* 12.1* 11.3* 12.0*  NEUTROABS 8.2*  --    --  9.1*  --   --   --   --   HGB 11.8*  < > 9.8*  --  9.7* 10.1* 10.0* 10.5*  HCT 36.9*  < > 31.7*  --  31.7* 32.6* 32.1* 34.4*  MCV 101.4*  < > 102.6*  --  102.6* 103.5* 104.6* 103.3*  PLT 223  < > 243  --  280 310 312 327  < > = values in this interval not displayed. Basic Metabolic Panel:  Recent Labs Lab 03/19/17 0849 03/20/17 0135 03/21/17 0519 03/22/17 0140 03/23/17 0303  NA 142 142 141 142 141  K 3.5 3.3* 5.1 3.9 4.0  CL 109 108 108 106 104  CO2 27 29 25 29 30   GLUCOSE 164* 151* 145* 120* 134*  BUN 46* 46* 50* 52* 55*  CREATININE 1.82* 1.83* 1.82* 1.78* 1.80*  CALCIUM 9.6 9.5 9.4 9.5 9.4  MG 2.4  --   --   --   --    Liver Function Tests:  Recent Labs Lab 03/19/17 1339  AST 31  ALT 21  ALKPHOS 86  BILITOT 0.2*  PROT 7.1  ALBUMIN 1.9*   Coagulation Profile:  Recent Labs Lab 03/19/17 0541 03/20/17 0135 03/21/17 0519 03/22/17 0140 03/23/17 0303  INR 2.07 2.45 2.46 2.63 2.75   CBG:  Recent Labs Lab 03/22/17 1600 03/22/17 1934 03/22/17 2258 03/23/17 0313 03/23/17 0732  GLUCAP 154* 79 142* 152* 106*    Recent Results (from the past 240 hour(s))  Culture, blood (  Routine X 2) w Reflex to ID Panel     Status: None   Collection Time: 03/17/17  5:27 PM  Result Value Ref Range Status   Specimen Description BLOOD RIGHT ARM  Final   Special Requests IN PEDIATRIC BOTTLE Blood Culture adequate volume  Final   Culture NO GROWTH 5 DAYS  Final   Report Status 03/22/2017 FINAL  Final  Culture, blood (Routine X 2) w Reflex to ID Panel     Status: None   Collection Time: 03/17/17  5:32 PM  Result Value Ref Range Status   Specimen Description BLOOD RIGHT ARM  Final   Special Requests IN PEDIATRIC BOTTLE Blood Culture adequate volume  Final   Culture NO GROWTH 5 DAYS  Final   Report Status 03/22/2017 FINAL  Final  MRSA PCR Screening     Status: Abnormal   Collection Time: 03/17/17  9:17 PM  Result Value Ref Range Status   MRSA by PCR POSITIVE (A) NEGATIVE  Final    Comment:        The GeneXpert MRSA Assay (FDA approved for NASAL specimens only), is one component of a comprehensive MRSA colonization surveillance program. It is not intended to diagnose MRSA infection nor to guide or monitor treatment for MRSA infections. RESULT CALLED TO, READ BACK BY AND VERIFIED WITH: RNJamelle Rushing [id number] @[id number]  THANEY   Culture, respiratory (NON-Expectorated)     Status: None   Collection Time: 03/19/17 11:55 AM  Result Value Ref Range Status   Specimen Description TRACHEAL ASPIRATE  Final   Special Requests NONE  Final   Gram Stain   Final    FEW WBC PRESENT,BOTH PMN AND MONONUCLEAR MODERATE YEAST    Culture MODERATE CANDIDA GLABRATA  Final   Report Status 03/21/2017 FINAL  Final         Radiology Studies: Dg Chest Port 1 View  Result Date: 03/22/2017 CLINICAL DATA:  Hypoxia EXAM: PORTABLE CHEST 1 VIEW COMPARISON:  03/19/2017; 03/17/2017; 03/06/2017 FINDINGS: Grossly unchanged enlarged cardiac silhouette and mediastinal contours given AP projection. Stable position of support apparatus. Pulmonary vasculature remains indistinct with cephalization of flow. Minimally improved aeration of lungs with persistent bibasilar opacities, left greater than right. No new focal airspace opacities. No definite pleural effusion or pneumothorax. No acute osseus abnormalities. IMPRESSION: 1.  Stable positioning of support apparatus.  No pneumothorax. 2. Minimally improved bibasilar atelectasis with otherwise similar findings of mild pulmonary edema. Electronically Signed   By: [name] M.D.   On: 03/22/2017 09:15        Scheduled Meds: . aspirin  81 mg Per Tube Daily  . atorvastatin  40 mg Per Tube QHS  . chlorhexidine  15 mL Mouth Rinse BID  . feeding supplement (PRO-STAT SUGAR FREE 64)  30 mL Per Tube BID  . FLUoxetine  60 mg Per Tube Daily  . furosemide  20 mg Intravenous Daily  . insulin aspart  0-15 Units Subcutaneous Q4H  . mouth rinse  15  mL Mouth Rinse q12n4p  . metoCLOPramide  5 mg Per Tube TID AC  . OLANZapine  5 mg Per Tube BID  . sodium chloride flush  3 mL Intravenous Q12H  . Valproate Sodium  250 mg Per Tube TID  . warfarin  6 mg Oral ONCE-1800  . Warfarin - Pharmacist Dosing Inpatient   Does not apply q1800   Continuous Infusions: . sodium chloride 250 mL (03/20/17 1900)  . ampicillin-sulbactam (UNASYN) IV Stopped (03/23/17 0538)  . feeding  supplement (OSMOLITE 1.5 CAL) 1,000 mL (03/23/17 0524)     LOS: 5 days     Paisleigh Maroney, MD, FACP, FHM. [facility] Pager [phone]  If 7PM-7AM, please contact night-coverage [email or web address] Password [email or web address] 03/23/2017, 11:35 AM

## 2017-03-24 ENCOUNTER — Inpatient Hospital Stay (HOSPITAL_COMMUNITY): Payer: Medicare Other

## 2017-03-24 DIAGNOSIS — R0902 Hypoxemia: Secondary | ICD-10-CM

## 2017-03-24 DIAGNOSIS — R0602 Shortness of breath: Secondary | ICD-10-CM

## 2017-03-24 LAB — GLUCOSE, CAPILLARY
GLUCOSE-CAPILLARY: 130 mg/dL — AB (ref 65–99)
GLUCOSE-CAPILLARY: 119 mg/dL — AB (ref 65–99)
GLUCOSE-CAPILLARY: 148 mg/dL — AB (ref 65–99)
Glucose-Capillary: 110 mg/dL — ABNORMAL HIGH (ref 65–99)
Glucose-Capillary: 122 mg/dL — ABNORMAL HIGH (ref 65–99)
Glucose-Capillary: 156 mg/dL — ABNORMAL HIGH (ref 65–99)
Glucose-Capillary: 158 mg/dL — ABNORMAL HIGH (ref 65–99)

## 2017-03-24 LAB — BASIC METABOLIC PANEL
ANION GAP: 9 (ref 5–15)
Anion gap: 7 (ref 5–15)
BUN: 59 mg/dL — AB (ref 6–20)
BUN: 62 mg/dL — ABNORMAL HIGH (ref 6–20)
CALCIUM: 9.5 mg/dL (ref 8.9–10.3)
CHLORIDE: 104 mmol/L (ref 101–111)
CO2: 32 mmol/L (ref 22–32)
CO2: 31 mmol/L (ref 22–32)
CREATININE: 1.77 mg/dL — AB (ref 0.61–1.24)
Calcium: 9.6 mg/dL (ref 8.9–10.3)
Chloride: 107 mmol/L (ref 101–111)
Creatinine, Ser: 1.76 mg/dL — ABNORMAL HIGH (ref 0.61–1.24)
GFR calc Af Amer: 46 mL/min — ABNORMAL LOW (ref >60)
GFR, EST AFRICAN AMERICAN: 46 mL/min — AB (ref >60)
GFR, EST NON AFRICAN AMERICAN: 39 mL/min — AB (ref >60)
GFR, EST NON AFRICAN AMERICAN: 40 mL/min — AB (ref >60)
Glucose, Bld: 119 mg/dL — ABNORMAL HIGH (ref 65–99)
Glucose, Bld: 185 mg/dL — ABNORMAL HIGH (ref 65–99)
POTASSIUM: 4 mmol/L (ref 3.5–5.1)
Potassium: 3.8 mmol/L (ref 3.5–5.1)
SODIUM: 146 mmol/L — AB (ref 135–145)
Sodium: 144 mmol/L (ref 135–145)

## 2017-03-24 LAB — PROTIME-INR
INR: 2.81
PROTHROMBIN TIME: 30.2 s — AB (ref 11.4–15.2)

## 2017-03-24 MED ORDER — WARFARIN SODIUM 5 MG PO TABS
5.0000 mg | ORAL_TABLET | Freq: Once | ORAL | Status: AC
  Administered 2017-03-24: 5 mg via ORAL
  Filled 2017-03-24: qty 1

## 2017-03-24 MED ORDER — ACETAMINOPHEN 160 MG/5ML PO SOLN
650.0000 mg | Freq: Four times a day (QID) | ORAL | Status: DC | PRN
  Administered 2017-03-24: 650 mg
  Filled 2017-03-24: qty 20.3

## 2017-03-24 MED ORDER — FUROSEMIDE 10 MG/ML IJ SOLN
60.0000 mg | Freq: Two times a day (BID) | INTRAMUSCULAR | Status: DC
  Administered 2017-03-24 – 2017-03-25 (×3): 60 mg via INTRAVENOUS
  Filled 2017-03-24 (×3): qty 6

## 2017-03-24 NOTE — Progress Notes (Signed)
PROGRESS NOTE   Curtis Keller  SJG:[id number]    DOB: 1956-07-27    DOA: 03/17/2017  PCP: Kirt Boys, DO   I have briefly reviewed patients previous medical records in [facility].  Brief Narrative:  61 year old male with PMH of VDRF, tracheostomy dependent, on oxygen via trach collar FiO2 28% PTA, dysphagia s/p PEG tube feeding, COPD, CVA with residual left hemiparesis/contractures, chronic systolic CHF, dementia, recurrent falls, recent hip fracture surgery, LV apical thrombus, presented with hypoxia and noted to have suspected aspiration pneumonia. Slowly improving.Despite aggressive treatment, remains with high oxygen demands and currently on trach collar oxygen FiO2 34% and desaturates quickly on attempting to wean. Requested pulmonology consultation to assist in management on 6/1.   Assessment & Plan:   Active Problems:   Heart failure, chronic systolic (HCC)   Depression   Apical mural thrombus   Protein calorie malnutrition (HCC)   PEG (percutaneous endoscopic gastrostomy) status (HCC)   COPD (chronic obstructive pulmonary disease) (HCC)   GERD (gastroesophageal reflux disease)   Diabetes mellitus, type II, insulin dependent (HCC)   Pneumonia   HCAP (healthcare-associated pneumonia)   Pressure injury of skin   1. Aspiration pneumonia: Likely related to dysphagia from prior CVA and apparently was fed by mouth at SNF. Initially started on IV vancomycin and cefepime which was changed to IV Unasyn. Strict nothing by mouth and aspiration precautions. Aggressive pulmonary toilet by RT. Tracheal aspirate showed moderate Candida. Blood cultures 2: Negative. Chest x-ray 5/30 was personally reviewed and showed improving pulmonary edema. Today is day 8 of IV antibiotics and would like to discontinue but will wait until pulmonology input. Patient is at high risk for recurrent aspiration. 2. Acute on chronic hypoxic respiratory failure/s/p tracheostomy from recent VDRF: Apparently  was on trach collar with FiO2 of 24-28% prior to admission. As per family, recently discharged from [facility] and was only at [facility] for a few hours prior to deterioration and hospitalization again. Acute respiratory failure secondary to aspiration pneumonia and decompensated CHF. Despite aggressive treatment, remains with high oxygen demands and currently on trach collar oxygen FiO2 34% and desaturates quickly on attempting to wean. Requested pulmonology consultation to assist in management on 6/1.  3. Acute on chronic systolic CHF: Not on diuretics PTA. Although clinically does not appear volume overloaded, chest x-ray seemed to suggest pulmonary edema which is slowly improving. Continue Lasix IV Lasix 20 MG daily. Metoprolol on hold due to soft blood pressures.  4. LV apical mural thrombus: INR was initially subtherapeutic and required heparin bridge. INR now therapeutic at 2.81 and being managed by pharmacy. 5. Stage III chronic kidney disease: Creatinine stable in the 1.7-1.8 range. Periodically monitor BMP. 6. Mood disorder/dementia: Continue Depakote, Zyprexa and fluoxetine. Mental status probably at baseline. 7. RUE swelling: Venous Doppler 5/29: No DVT or SVT. 8. Buttock lesion: As per wound care consultation 5/30, generalized erythema, left buttock with partial-thickness linear ablation of unknown etiology and the appearance is not consistent with a pressure injury. Continue foam dressing as per their recommendations. 9. Hypokalemia: Replaced. 10. Anemia, chronic: Macrocytic. May be of chronic disease. Stable. 11. CVA with received dual left hemiparesis: Continue aspirin, statins, warfarin anticoagulation. 12. Hyperglycemia: Without diagnosis of DM. On SSI. A1c 5.3.. Reasonable inpatient control.   DVT prophylaxis: Anticoagulated on warfarin Code Status: Full Family Communication: Discussed in detail with patient's sister on 5/30. None at bedside today. Updated care and answered  questions. Disposition: SNF when medically improved and stable.   Consultants:  Pulmonology-pending  Procedures:  Has tracheostomy and PEG tube since prior to admission.  Antimicrobials:  IV Unasyn    Subjective: Complaints of pain in his left distal thigh, mildly tender to touch, has a small cut back off left knee/popliteal foci area without acute findings. Denies any other complaints. No dyspnea or chest pain reported. Mild dry cough noted.  ROS: No nausea, vomiting or dysuria reported.  Objective:  Vitals:   03/24/17 0759 03/24/17 0904 03/24/17 1119 03/24/17 1129  BP: (!) 140/91   112/85  Pulse:  82 89 82  Resp:  20 13 (!) 22  Temp: 98 F (36.7 C)   97.6 F (36.4 C)  TempSrc: Oral   Oral  SpO2:  95% 95% 94%  Weight:      Height:        Examination:  General exam: Pleasant middle-aged male, chronically ill-looking, lying comfortably propped up in bed.He does not appear in discomfort or distress. Intermittent pain on attempting to stretch left lower extremity. Respiratory system: Diminished breath sounds in the bases but otherwise clear to auscultation without wheezing, rhonchi or crackles. No increased work of breathing. Tracheostomy/tract collar at FiO2 34%. Cardiovascular system: S1 & S2 heard, RRR. No JVD, murmurs, rubs, gallops or clicks. No pedal edema. Telemetry: Sinus rhythm with frequent PVCs. Gastrointestinal system: Abdomen is nondistended, soft and nontender. No organomegaly or masses felt. Normal bowel sounds heard. Central nervous system: Alert and oriented to self. Follows simple instructions. No focal neurological deficits. Extremities: Right limbs grade 5 x 5 power. Left limbs at least grade 2 x 5 power but has contractures. Mildly tender in distal left thigh without any other acute findings. Small linear cut in the left popliteal foci without acute findings of infection. Skin: Exam as per wound care RN on 5/30 Psychiatry: Judgement and insight impaired.  Mood & affect appropriate.     Data Reviewed: I have personally reviewed following labs and imaging studies  CBC:  Recent Labs Lab 03/17/17 1413  03/19/17 0849 03/19/17 1339 03/20/17 0135 03/21/17 0519 03/22/17 0140 03/23/17 0303  WBC 14.6*  < > 17.1*  --  13.2* 12.1* 11.3* 12.0*  NEUTROABS 8.2*  --   --  9.1*  --   --   --   --   HGB 11.8*  < > 9.8*  --  9.7* 10.1* 10.0* 10.5*  HCT 36.9*  < > 31.7*  --  31.7* 32.6* 32.1* 34.4*  MCV 101.4*  < > 102.6*  --  102.6* 103.5* 104.6* 103.3*  PLT 223  < > 243  --  280 310 312 327  < > = values in this interval not displayed. Basic Metabolic Panel:  Recent Labs Lab 03/19/17 0849 03/20/17 0135 03/21/17 0519 03/22/17 0140 03/23/17 0303 03/24/17 0224  NA 142 142 141 142 141 146*  K 3.5 3.3* 5.1 3.9 4.0 3.8  CL 109 108 108 106 104 107  CO2 27 29 25 29 30  32  GLUCOSE 164* 151* 145* 120* 134* 119*  BUN 46* 46* 50* 52* 55* 59*  CREATININE 1.82* 1.83* 1.82* 1.78* 1.80* 1.77*  CALCIUM 9.6 9.5 9.4 9.5 9.4 9.5  MG 2.4  --   --   --   --   --    Liver Function Tests:  Recent Labs Lab 03/19/17 1339  AST 31  ALT 21  ALKPHOS 86  BILITOT 0.2*  PROT 7.1  ALBUMIN 1.9*   Coagulation Profile:  Recent Labs Lab 03/20/17 0135 03/21/17 0519 03/22/17  0140 03/23/17 0303 03/24/17 0224  INR 2.45 2.46 2.63 2.75 2.81   CBG:  Recent Labs Lab 03/23/17 2257 03/24/17 0319 03/24/17 0753 03/24/17 0842 03/24/17 1142  GLUCAP 147* 130* 119* 156* 158*    Recent Results (from the past 240 hour(s))  Culture, blood (Routine X 2) w Reflex to ID Panel     Status: None   Collection Time: 03/17/17  5:27 PM  Result Value Ref Range Status   Specimen Description BLOOD RIGHT ARM  Final   Special Requests IN PEDIATRIC BOTTLE Blood Culture adequate volume  Final   Culture NO GROWTH 5 DAYS  Final   Report Status 03/22/2017 FINAL  Final  Culture, blood (Routine X 2) w Reflex to ID Panel     Status: None   Collection Time: 03/17/17  5:32 PM   Result Value Ref Range Status   Specimen Description BLOOD RIGHT ARM  Final   Special Requests IN PEDIATRIC BOTTLE Blood Culture adequate volume  Final   Culture NO GROWTH 5 DAYS  Final   Report Status 03/22/2017 FINAL  Final  MRSA PCR Screening     Status: Abnormal   Collection Time: 03/17/17  9:17 PM  Result Value Ref Range Status   MRSA by PCR POSITIVE (A) NEGATIVE Final    Comment:        The GeneXpert MRSA Assay (FDA approved for NASAL specimens only), is one component of a comprehensive MRSA colonization surveillance program. It is not intended to diagnose MRSA infection nor to guide or monitor treatment for MRSA infections. RESULT CALLED TO, READ BACK BY AND VERIFIED WITH: RN, Jamelle Rushing [id number] @[phone]  THANEY   Culture, respiratory (NON-Expectorated)     Status: None   Collection Time: 03/19/17 11:55 AM  Result Value Ref Range Status   Specimen Description TRACHEAL ASPIRATE  Final   Special Requests NONE  Final   Gram Stain   Final    FEW WBC PRESENT,BOTH PMN AND MONONUCLEAR MODERATE YEAST    Culture MODERATE CANDIDA GLABRATA  Final   Report Status 03/21/2017 FINAL  Final         Radiology Studies: No results found.      Scheduled Meds: . aspirin  81 mg Per Tube Daily  . atorvastatin  40 mg Per Tube QHS  . chlorhexidine  15 mL Mouth Rinse BID  . feeding supplement (PRO-STAT SUGAR FREE 64)  30 mL Per Tube BID  . FLUoxetine  60 mg Per Tube Daily  . furosemide  20 mg Intravenous Daily  . insulin aspart  0-15 Units Subcutaneous Q4H  . mouth rinse  15 mL Mouth Rinse q12n4p  . metoCLOPramide  5 mg Per Tube TID AC  . OLANZapine  5 mg Per Tube BID  . sodium chloride flush  3 mL Intravenous Q12H  . Valproate Sodium  250 mg Per Tube TID  . warfarin  5 mg Oral ONCE-1800  . Warfarin - Pharmacist Dosing Inpatient   Does not apply q1800   Continuous Infusions: . sodium chloride 250 mL (03/20/17 1900)  . ampicillin-sulbactam (UNASYN) IV 1.5 g (03/24/17 0615)   . feeding supplement (OSMOLITE 1.5 CAL) 1,000 mL (03/23/17 1851)     LOS: 6 days     Kameran Lallier, MD, FACP, FHM. [facility] Pager [phone]  If 7PM-7AM, please contact night-coverage [email or web address] Password [email or web address] 03/24/2017, 12:05 PM

## 2017-03-24 NOTE — Consult Note (Addendum)
Name: Curtis Keller MRN: [id number] DOB: [birth year]    ADMISSION DATE:  03/17/2017 CONSULTATION DATE:  03/24/2017  REFERRING MD :  Dr. Waymon Amato   CHIEF COMPLAINT:  Hypoxia   HISTORY OF PRESENT ILLNESS:   61 year old male with PMH of CVA with left hemiparesis/contractures s/p trach and PEG, systolic HF, depression, apical mural thrombus, COPD, GERD, DM2.   Presents to ED on 6/1 with hypoxia from SNF. Facility reports that patient became hypoxic and a large mucous plug was removed without improvement in saturations. Upon arrival to ED wife reports that patient did not have on TC of 28% while he was sleeping. CXR revealed bilateral lobe infiltrates and antibiotics were administered for HCAP coverage. It was reported that patient has a history of dysphagia requiring PEG placement, however has also been on dysphagia diet with concerns of aspiration Blondell Reveal was started.   Throughout stay patient has been slow to wean Fi02. CXR revealed pulmonary edema and patient was diuresised with Lasix 20 mg IV. PCCM was asked to consult.   SIGNIFICANT EVENTS  5/25 > Presents to ED after large mucous plug removed with no improvement in oxygenation   STUDIES:  CXR 5/25 > Cardiomegaly with central vascular congestion and mild diffuse edema, streaky atelectasis or PNA at the left base  CXR 5/27 > worsening of pulmonary edema and bibasilar atelectasis left greater then right  CXR 5/30 > Mildly improving bibasilar atelectasis with otherwise similar findings of mild pulmonary edema   ANTIBIOTICS: Cefepime 5/25 > 5/27 Vancomycin 5/26 > 5/27 Unasyn 5/29 > 5/31    PAST MEDICAL HISTORY :   has a past medical history of Acute respiratory failure (HCC); Anemia; Apical mural thrombus; CAD (coronary artery disease); Cerebral infarction (HCC); Chronic systolic CHF (congestive heart failure) (HCC); Dementia; Depression; Dysphagia; Falls; History of pneumonia; Hyperlipidemia; Hypertension; Ischemic cardiomyopathy;  Left hemiplegia (HCC); Protein calorie malnutrition (HCC); Respiratory failure (HCC); Spleen injury; and Status post tracheostomy (HCC).  has a past surgical history that includes Tracheostomy tube placement (N/A, 02/22/2016); Coronary angioplasty with stent; Cardiac catheterization; PR LAP, SPLENECTOMY (01/07/2016); ir generic historical (07/22/2016); Tracheostomy tube placement (N/A, 01/14/2017); IR GASTROSTOMY TUBE MOD SED (02/02/2017); and Hip Arthroplasty (Left, 02/10/2017). Prior to Admission medications   Medication Sig Start Date End Date Taking? Authorizing Provider  Amino Acids-Protein Hydrolys (FEEDING SUPPLEMENT, PRO-STAT SUGAR FREE 64,) LIQD Place 60 mLs into feeding tube 2 (two) times daily.    Yes [provider]  aspirin 81 MG tablet Place 81 mg into feeding tube daily.   Yes [provider]  atorvastatin (LIPITOR) 40 MG tablet Place 40 mg into feeding tube at bedtime. for Lipids   Yes [provider]  bacitracin 500 UNIT/GM ointment Apply 1 application topically See admin instructions. Apply to left hip topically every day shift for wound care - cover with dry dressing   Yes [provider]  cyanocobalamin 500 MCG tablet Place 1,000 mcg into feeding tube daily.   Yes [provider]  famotidine (PEPCID) 20 MG tablet Place 20 mg into feeding tube 2 (two) times daily. for GERD   Yes [provider]  FLUoxetine (PROZAC) 20 MG capsule Place 60 mg into feeding tube daily.   Yes [provider]  folic acid (FOLVITE) 1 MG tablet Place 1 mg into feeding tube daily.    Yes [provider]  guaiFENesin 200 MG tablet Place 200 mg into feeding tube 3 (three) times daily. for CHF   Yes [provider]  lidocaine (LIDODERM) 5 % Place 1 patch onto the skin daily. Remove & Discard patch within 12 hours or as directed by MD 12/13/16  Yes Leroy Sea, MD  metoCLOPramide (REGLAN) 5 MG tablet Place 5 mg into feeding tube See  admin instructions. Ordered 03/16/17 - 1 tablet (5 mg) per tube every 8 hours for GERD for 3 days 03/17/17 03/19/17 Yes [provider]  metoprolol tartrate (LOPRESSOR) 25 MG tablet Place 25 mg into feeding tube 2 (two) times daily.   Yes [provider]  Multiple Vitamin (MULTIVITAMIN WITH MINERALS) TABS tablet Place 1 tablet into feeding tube daily.   Yes [provider]  Nutritional Supplements (NUTRITIONAL SUPPLEMENT PO) Place into feeding tube See admin instructions. nephro 1.8 per PEG tube at 55 ml/hr continuous every shift for diet   Yes [provider]  OLANZapine (ZYPREXA) 5 MG tablet Place 5 mg into feeding tube 2 (two) times daily. Give 1 tablet via G-tube two times daily    Yes [provider]  Omega-3 Fatty Acids (FISH OIL) 1000 MG CAPS Place 1,000 mg into feeding tube daily.   Yes [provider]  oxyCODONE-acetaminophen (PERCOCET/ROXICET) 5-325 MG tablet Place 1 tablet into feeding tube every 8 (eight) hours. Give 1 tablet via G-tube every 8 hours for pain    Yes [provider]  OXYGEN See admin instructions. Oxygen at 28% via trach cuff every shift   Yes [provider]  polyethylene glycol (MIRALAX / GLYCOLAX) packet Place 17 g into feeding tube daily. for constipation   Yes [provider]  senna (SENOKOT) 8.6 MG TABS tablet Place 1 tablet into feeding tube 2 (two) times daily. For constipation   Yes [provider]  tamsulosin (FLOMAX) 0.4 MG CAPS capsule 0.4 mg every evening.    Yes [provider]  Valproate Sodium (DEPAKENE) 250 MG/5ML SOLN solution Place 250 mg into feeding tube 3 (three) times daily.    Yes [provider]  warfarin (COUMADIN) 6 MG tablet Place 6 mg into feeding tube every evening.    Yes [provider]   Allergies  Allergen Reactions  . Codeine Anaphylaxis and Nausea And Vomiting  . Lisinopril Itching and Rash  . Hydrocodone Other (See  Comments)    On MAR  . Hydrocodone-Acetaminophen Nausea And Vomiting    Cannot take any acetaminophen due to liver issues per sister  . Tegaderm Ag Mesh [Silver] Other (See Comments)    On MAR  . Tylenol [Acetaminophen] Nausea Only and Other (See Comments)    Cannot take any acetaminophen due to liver issues per sister    FAMILY HISTORY:  family history includes Alcohol abuse in his father, maternal uncle, and mother; Coronary artery disease in his father and mother; Hypertension in his father and mother. SOCIAL HISTORY:  reports that he has quit smoking. His smoking use included Cigarettes. He has a 76.00 pack-year smoking history. He has never used smokeless tobacco. He reports that he does not drink alcohol or use drugs.  REVIEW OF SYSTEMS:   All negative; except for those that are bolded, which indicate positives.  Constitutional: weight loss, weight gain, night sweats, fevers, chills, fatigue, weakness.  HEENT: headaches, sore throat, sneezing, nasal congestion, post nasal drip, difficulty swallowing, tooth/dental problems, visual complaints, visual changes, ear aches. Neuro: difficulty with speech, weakness, numbness, ataxia. CV:  chest pain, orthopnea, PND, swelling in lower extremities, dizziness, palpitations, syncope.  Resp: cough, hemoptysis, dyspnea, wheezing. GI: heartburn, indigestion,  abdominal pain, nausea, vomiting, diarrhea, constipation, change in bowel habits, loss of appetite, hematemesis, melena, hematochezia.  GU: dysuria, change in color of urine, urgency or frequency, flank pain, hematuria. MSK: joint pain or swelling, decreased range of motion. Psych: change in mood or affect, depression, anxiety, suicidal ideations, homicidal ideations. Skin: rash, itching, bruising.  SUBJECTIVE:  Denies pain/dyspnea. Sitting in Chair no respiratory distress  VITAL SIGNS: Temp:  [97.5 F (36.4 C)-98.5 F (36.9 C)] 98 F (36.7 C) (06/01 0759) Pulse Rate:  [77-87] 82  (06/01 0904) Resp:  [11-20] 20 (06/01 0904) BP: (122-140)/(74-91) 140/91 (06/01 0759) SpO2:  [91 %-98 %] 95 % (06/01 0904) FiO2 (%):  [35 %] 35 % (06/01 0904) Weight:  [93.2 kg (205 lb 7.5 oz)] 93.2 kg (205 lb 7.5 oz) (06/01 0412)  PHYSICAL EXAMINATION: General:  Adult male, no distress  Neuro:  Alert, follows commands, moves right side, 2/5 on left with contractures noted  HEENT:  Trach in place > clean, dry, intact  Cardiovascular:  RRR, no MRG Lungs: bibasilar crackles, no wheeze, non-labored   Abdomen:  Obese, active bowel sounds, PEG in place  Musculoskeletal:  No edema  Skin:  Warm, dry, intact    Recent Labs Lab 03/22/17 0140 03/23/17 0303 03/24/17 0224  NA 142 141 146*  K 3.9 4.0 3.8  CL 106 104 107  CO2 29 30 32  BUN 52* 55* 59*  CREATININE 1.78* 1.80* 1.77*  GLUCOSE 120* 134* 119*    Recent Labs Lab 03/21/17 0519 03/22/17 0140 03/23/17 0303  HGB 10.1* 10.0* 10.5*  HCT 32.6* 32.1* 34.4*  WBC 12.1* 11.3* 12.0*  PLT 310 312 327   No results found.  ASSESSMENT / PLAN:  Acute on Chronic Hypoxic Respiratory Failure s/p tracheostomy in setting of Aspiration PNA vs HCAP +Pulmonary Edema  Plan  -Maintain Saturation >92 (Continue to wean down to achieve - baseline 28% currently on 30%)  -Pulmonary Hygiene  -Continue to diuresis as Renal Function allows to achieve negative balance (Currently 3.6L positive)  -Continue Unasyn (Day 4)  -Trend CXR  -Trach care per protocol   CKD Stage 3 (Baseline Crt 1.5-2.0) Volume Overload  Plan  -Trend BMP -Continue to diuresis > increase to 60 BID   Remaining care per primary team   Jovita Kussmaul, AGACNP-BC [facility]  Pgr: [phone]  PCCM Pgr: [phone]   STAFF NOTE: Cindi Carbon, MD FACP have personally reviewed patient's available data, including medical history, events of note, physical examination and test results as part of my evaluation. I have discussed with resident/NP  and other care providers such as pharmacist, RN and RRT. In addition, I personally evaluated patient and elicited key findings of: awake, follows commands, in chair, no distress, lungs sound with crackles bilateral, has JVD< trach site clean, I reviewed all his films he has chronic int changes and in past has had LL infiltrate this year that resolved, on this admission he again presents with concerns aspiration and now with int prominence edema on top, he remains 3 liters pos for stay and last 24 hours he again was positive, I also have some concerns about LLL again with acute new infiltrate, he is just slightly over his O2 needs from baseline, would escalate lasix even further IV to neg 1 1 liter balance following pcxr and chem in am, agree he needs treatment for asp PNA even for 7 days abx, and follow up imaging pcxr of LLL, it is likley with this approach  and neg balance being most important that he improves his O2 needs, maintain this diuretic as tolerated over weekend and we will re assess Monday unless needed prior, if he spikes as he is from NH then I would have pseudomonal coverage and dc unasyn, secretions have copious description 3 days ago but is improving now and likley responding to current abx course, if they worsen change abx as above and add mucomysts  Mcarthur Rossetti. Tyson Alias, MD, FACP Pgr: [phone] [facility] 03/24/2017 12:19 PM

## 2017-03-24 NOTE — Progress Notes (Signed)
Physical Therapy Treatment Patient Details Name: Curtis Keller MRN: [id number] DOB: 09-24-1956 Today's Date: 03/24/2017    History of Present Illness Pt. with PMH of right dependent respiratory failure, requiring tracheostomy, dysphagia requiring PEG tube placement, COPD, CVA, residual weakness with contracture of the left upper and lower extremity, chronic systolic CHF, dementia, recurrent fall, recent L hip fracture with surgery (posterior hemiarthroplasty 02/10/17), LV apical thrombus; admitted on 03/17/2017, presented with complaint of hypoxia, was found to healthcare associated pneumonia.    PT Comments    Pt seemed to be more actively participating in his mobility today, better able to help progress to EOB and support himself in sitting.  Scoot pivot transfer preformed to the recliner chair, may be able to try the steady next session for some pre-standing mini squats from an elevated bed.  Pt tolerate, although uncomfortable ROM to left leg focusing on knee extension.  PT will continue to follow acutely.   Follow Up Recommendations  SNF     Equipment Recommendations  Wheelchair (measurements PT);Wheelchair cushion (measurements PT);Hospital bed;Other (comment) (hoyer lift)    Recommendations for Other Services   NA     Precautions / Restrictions Precautions Precautions: Fall Precaution Comments: L hip hemi 02/10/17 from injury 2/18 posterior approach likely posterior hip precautions.  Required Braces or Orthoses: Other Brace/Splint Other Brace/Splint: family member brought a soft posey knee splint that is helping to keep his knee straighter.  I informed RN and family member not to have pillows under his left knee, put them under his lower leg and keep leg portion of bed flat.  Restrictions Weight Bearing Restrictions: No    Mobility  Bed Mobility Overal bed mobility: Needs Assistance Bed Mobility: Supine to Sit     Supine to sit: +2 for physical assistance;Mod assist      General bed mobility comments: Pt assisting with his right hand and right foot, both on the bed rail and to hold to therapist as he was helped to sitting.  It was heavy mod mostly at his trunk, but he did help quite a bit today.   Transfers Overall transfer level: Needs assistance   Transfers: Lateral/Scoot Transfers          Lateral/Scoot Transfers: +2 physical assistance;Max assist General transfer comment: Scooting to the right to the drop arm recliner chair.  Pt assisting with his right hand to both reach to the right and to hold to therapist to help lift his body.  Max assist to move hips over.  RN helping to ensure safety in gap, so technically +3 used         Balance Overall balance assessment: Needs assistance Sitting-balance support: Feet supported;Single extremity supported Sitting balance-Leahy Scale: Fair Sitting balance - Comments: Once positioned with both feet supported under body EOB and with right hand on chair rail, pt was able to be supervision, but initially max assist until he was positioned better.                                     Cognition Arousal/Alertness: Awake/alert Behavior During Therapy: WFL for tasks assessed/performed Overall Cognitive Status: History of cognitive impairments - at baseline                                        Exercises Other Exercises Other  Exercises: PROM to left leg ankle DF x 2 (2 mins), knee extension x 2 (3 mins until grimacing), and hip abduction x10 reps.   Other Exercises: PROM to left elbow extension, wrist extension x2 reps (1 min each) and towel roll placed in palm for protection from finger nails.         Pertinent Vitals/Pain Pain Assessment: Faces Faces Pain Scale: Hurts whole lot Pain Location: left knee with extension ROM/stretch.  Pain Descriptors / Indicators: Grimacing;Guarding Pain Intervention(s): Limited activity within patient's tolerance;Monitored during  session;Repositioned           PT Goals (current goals can now be found in the care plan section) Acute Rehab PT Goals Patient Stated Goal: family member would like to get his left leg stretched out again (it has gotten tighter since he has been here in the hospital).  Progress towards PT goals: Progressing toward goals    Frequency    Min 2X/week      PT Plan Current plan remains appropriate       AM-PAC PT "6 Clicks" Daily Activity  Outcome Measure  Difficulty turning over in bed (including adjusting bedclothes, sheets and blankets)?: Total Difficulty moving from lying on back to sitting on the side of the bed? : Total Difficulty sitting down on and standing up from a chair with arms (e.g., wheelchair, bedside commode, etc,.)?: Total Help needed moving to and from a bed to chair (including a wheelchair)?: Total Help needed walking in hospital room?: Total Help needed climbing 3-5 steps with a railing? : Total 6 Click Score: 6    End of Session Equipment Utilized During Treatment: Other (comment);Oxygen (TC) Activity Tolerance: Patient limited by fatigue;Patient limited by pain Patient left: in chair;with call bell/phone within reach Nurse Communication: Mobility status;Need for lift equipment PT Visit Diagnosis: Muscle weakness (generalized) (M62.81)     Time: 1610-9604 PT Time Calculation (min) (ACUTE ONLY): 23 min  Charges:  $Therapeutic Exercise: 8-22 mins $Therapeutic Activity: 8-22 mins          Curtis Keller, PT, [name] [phone]            03/24/2017, 10:35 AM

## 2017-03-24 NOTE — Care Management Important Message (Signed)
Important Message  Patient Details  Name: Curtis Keller MRN: [id number] Date of Birth: 1956-03-21   Medicare Important Message Given:  Yes    [name], RN 03/24/2017, 3:43 PMImportant Message  Patient Details  Name: Curtis Keller MRN: [id number] Date of Birth: 11-05-1955   Medicare Important Message Given:  Yes    [name], RN 03/24/2017, 3:43 PM

## 2017-03-25 DIAGNOSIS — I472 Ventricular tachycardia: Secondary | ICD-10-CM

## 2017-03-25 LAB — GLUCOSE, CAPILLARY
GLUCOSE-CAPILLARY: 129 mg/dL — AB (ref 65–99)
Glucose-Capillary: 105 mg/dL — ABNORMAL HIGH (ref 65–99)
Glucose-Capillary: 117 mg/dL — ABNORMAL HIGH (ref 65–99)
Glucose-Capillary: 133 mg/dL — ABNORMAL HIGH (ref 65–99)
Glucose-Capillary: 153 mg/dL — ABNORMAL HIGH (ref 65–99)
Glucose-Capillary: 154 mg/dL — ABNORMAL HIGH (ref 65–99)

## 2017-03-25 LAB — BASIC METABOLIC PANEL
Anion gap: 8 (ref 5–15)
BUN: 65 mg/dL — AB (ref 6–20)
CO2: 33 mmol/L — AB (ref 22–32)
Calcium: 9.6 mg/dL (ref 8.9–10.3)
Chloride: 105 mmol/L (ref 101–111)
Creatinine, Ser: 1.68 mg/dL — ABNORMAL HIGH (ref 0.61–1.24)
GFR calc Af Amer: 49 mL/min — ABNORMAL LOW (ref >60)
GFR, EST NON AFRICAN AMERICAN: 42 mL/min — AB (ref >60)
Glucose, Bld: 112 mg/dL — ABNORMAL HIGH (ref 65–99)
POTASSIUM: 3.7 mmol/L (ref 3.5–5.1)
Sodium: 146 mmol/L — ABNORMAL HIGH (ref 135–145)

## 2017-03-25 LAB — PROTIME-INR
INR: 3.19
PROTHROMBIN TIME: 33.4 s — AB (ref 11.4–15.2)

## 2017-03-25 LAB — MAGNESIUM: MAGNESIUM: 2.5 mg/dL — AB (ref 1.7–2.4)

## 2017-03-25 MED ORDER — VITAMIN B-12 1000 MCG PO TABS
1000.0000 ug | ORAL_TABLET | Freq: Every day | ORAL | Status: DC
  Administered 2017-03-25 – 2017-03-30 (×6): 1000 ug
  Filled 2017-03-25 (×6): qty 1

## 2017-03-25 MED ORDER — WARFARIN SODIUM 3 MG PO TABS
3.0000 mg | ORAL_TABLET | Freq: Once | ORAL | Status: AC
  Administered 2017-03-25: 3 mg via ORAL
  Filled 2017-03-25: qty 1

## 2017-03-25 MED ORDER — FAMOTIDINE 20 MG PO TABS
20.0000 mg | ORAL_TABLET | Freq: Two times a day (BID) | ORAL | Status: DC
  Administered 2017-03-25 – 2017-03-28 (×8): 20 mg
  Filled 2017-03-25 (×8): qty 1

## 2017-03-25 MED ORDER — FOLIC ACID 1 MG PO TABS
1.0000 mg | ORAL_TABLET | Freq: Every day | ORAL | Status: DC
  Administered 2017-03-25 – 2017-03-30 (×6): 1 mg
  Filled 2017-03-25 (×6): qty 1

## 2017-03-25 MED ORDER — ADULT MULTIVITAMIN W/MINERALS CH
1.0000 | ORAL_TABLET | Freq: Every day | ORAL | Status: DC
  Administered 2017-03-25 – 2017-03-30 (×6): 1
  Filled 2017-03-25 (×5): qty 1

## 2017-03-25 MED ORDER — SENNA 8.6 MG PO TABS
1.0000 | ORAL_TABLET | Freq: Two times a day (BID) | ORAL | Status: DC
  Administered 2017-03-25 – 2017-03-30 (×7): 8.6 mg
  Filled 2017-03-25 (×8): qty 1

## 2017-03-25 MED ORDER — POLYETHYLENE GLYCOL 3350 17 G PO PACK
17.0000 g | PACK | Freq: Every day | ORAL | Status: DC
  Administered 2017-03-25 – 2017-03-29 (×3): 17 g
  Filled 2017-03-25 (×4): qty 1

## 2017-03-25 MED ORDER — POTASSIUM CHLORIDE 20 MEQ/15ML (10%) PO SOLN
40.0000 meq | Freq: Once | ORAL | Status: AC
  Administered 2017-03-25: 40 meq
  Filled 2017-03-25: qty 30

## 2017-03-25 MED ORDER — METOPROLOL TARTRATE 25 MG PO TABS
25.0000 mg | ORAL_TABLET | Freq: Two times a day (BID) | ORAL | Status: DC
  Administered 2017-03-25 – 2017-03-27 (×5): 25 mg
  Filled 2017-03-25 (×5): qty 1

## 2017-03-25 NOTE — Progress Notes (Signed)
ANTICOAGULATION CONSULT NOTE  Pharmacy Consult for Warfarin Indication: history of mural apical thrombus  Patient Measurements: Height: 6\' 2"  (188 cm) Weight: 208 lb 11.2 oz (94.7 kg) IBW/kg (Calculated) : 82.2  Ideal body weight: 82.2 kg (181 lb 3.5 oz) Adjusted ideal body weight: 87.2 kg (192 lb 3.4 oz)  Heparin Wt: 102 kg  Vital Signs: Temp: 97.8 F (36.6 C) (06/02 0709) Temp Source: Oral (06/02 0709) BP: 124/88 (06/02 0709) Pulse Rate: 83 (06/02 0815)  Labs:  Recent Labs  03/23/17 0303 03/24/17 0224 03/24/17 1832 03/25/17 0213  HGB 10.5*  --   --   --   HCT 34.4*  --   --   --   PLT 327  --   --   --   LABPROT 29.6* 30.2*  --  33.4*  INR 2.75 2.81  --  3.19  CREATININE 1.80* 1.77* 1.76* 1.68*    Estimated Creatinine Clearance: 53 mL/min (A) (by C-G formula based on SCr of 1.68 mg/dL (H)).  Assessment: 33 YOM with complicated medical history of cerebral infarction with trach and g-tube. History of apical thrombus on chronic warfarin. Pharmacy consulted to dose warfarin (s/p heparin bridge d/c'd on 5/27).   INR slightly supratherapeutic at 3.19 with INR goal of 2-3. INR has been slowly trending up on mostly PTA dose and slightly reduced dose last night. Last CBC low but stable. No overt s/sx of bleeding noted. Albumin level appears to be chronically low. Unasyn course was completed 6/1. Currently being diuresed for volume overload.  PTA dose: 6mg /day  Goal of Therapy:  INR 2-3 Monitor platelets by anticoagulation protocol: Yes   Plan:  Warfarin 3 mg x 1 dose at 1800 today Daily INR, CBC as needed Monitor for s/s bleeding   Carylon Perches, PharmD Acute Care Pharmacy Resident  Pager: [phone] 03/25/2017

## 2017-03-25 NOTE — Progress Notes (Signed)
PROGRESS NOTE   Curtis Keller  ZOX:[id number]    DOB: 08-18-1956    DOA: 03/17/2017  PCP: Kirt Boys, DO   I have briefly reviewed patients previous medical records in [facility].  Brief Narrative:  61 year old male with PMH of VDRF, tracheostomy dependent, on oxygen via trach collar FiO2 28% PTA, dysphagia s/p PEG tube feeding, COPD, CVA with residual left hemiparesis/contractures, chronic systolic CHF, dementia, recurrent falls, recent hip fracture surgery, LV apical thrombus, presented with hypoxia and noted to have suspected aspiration pneumonia. Slowly improving.Despite aggressive treatment, remains with high oxygen demands and currently on trach collar oxygen FiO2 34% and desaturates quickly on attempting to wean. Pulmonology was consulted and increased Lasix for suspected decompensated CHF contributing to higher oxygen requirements.   Assessment & Plan:   Active Problems:   Heart failure, chronic systolic (HCC)   Depression   Apical mural thrombus   Protein calorie malnutrition (HCC)   PEG (percutaneous endoscopic gastrostomy) status (HCC)   COPD (chronic obstructive pulmonary disease) (HCC)   GERD (gastroesophageal reflux disease)   Diabetes mellitus, type II, insulin dependent (HCC)   Pneumonia   HCAP (healthcare-associated pneumonia)   Pressure injury of skin   SOB (shortness of breath)   Hypoxia   1. Aspiration pneumonia: Likely related to dysphagia from prior CVA and apparently was fed by mouth at SNF. Initially started on IV vancomycin and cefepime which was changed to IV Unasyn. Strict nothing by mouth and aspiration precautions. Aggressive pulmonary toilet by RT. Tracheal aspirate showed moderate Candida. Blood cultures 2: Negative. Chest x-ray 5/30 was personally reviewed and showed improving pulmonary edema. Completed 8 days of IV antibiotics on 03/24/17. 2. Acute on chronic hypoxic respiratory failure/s/p tracheostomy from recent VDRF: Apparently was on  trach collar with FiO2 of 24-28% prior to admission. As per family, recently discharged from [facility] and was only at [facility] for a few hours prior to deterioration and hospitalization again. Acute respiratory failure secondary to aspiration pneumonia and decompensated CHF. Despite aggressive treatment, had ongoing high oxygen requirements and pulmonology was consulted. Input appreciated. Treating for decompensated CHF with aggressive IV Lasix diuresis. Improving. FiO2 down to 30%. Saturating in the high 90s. Discussed with RT to wean as tolerated. 3. Acute on chronic systolic CHF: Not on diuretics PTA. Although clinically does not appear volume overloaded, chest x-ray seemed to suggest pulmonary edema which is slowly improving. Lasix increased by pulmonology to 80 mg every 8 hourly. UO 2325 ML yesterday. 4. LV apical mural thrombus: INR was initially subtherapeutic and required heparin bridge. INR now supratherapeutic and being managed by pharmacy with reduced dosing. 5. Stage III chronic kidney disease: Creatinine stable in the 1.7-1.8 range. Periodically monitor BMP. 6. Mood disorder/dementia: Continue Depakote, Zyprexa and fluoxetine. Mental status probably at baseline. 7. RUE swelling: Venous Doppler 5/29: No DVT or SVT. 8. Buttock lesion: As per wound care consultation 5/30, generalized erythema, left buttock with partial-thickness linear ablation of unknown etiology and the appearance is not consistent with a pressure injury. Continue foam dressing as per their recommendations. 9. Hypokalemia: Replaced. 10. Anemia, chronic: Macrocytic. May be of chronic disease. Stable. 11. CVA with received dual left hemiparesis: Continue aspirin, statins, warfarin anticoagulation. 12. Hyperglycemia: Without diagnosis of DM. On SSI. A1c 5.3.. Reasonable inpatient control. 13. NSVT: Replace potassium (4 and above) and magnesium (2 and above). Resume metoprolol.   DVT prophylaxis: Anticoagulated on warfarin Code  Status: Full Family Communication: None at bedside today.  Disposition: SNF when medically improved  and stable, possibly early next week.   Consultants:  Pulmonology-pending  Procedures:  Has tracheostomy and PEG tube since prior to admission.  Antimicrobials:  IV Unasyn    Subjective: Mild dry cough. No chest pain or dyspnea. No leg pain reported today.  ROS: No nausea, vomiting or dysuria reported.  Objective:  Vitals:   03/25/17 0709 03/25/17 0815 03/25/17 1143 03/25/17 1201  BP: 124/88  108/82   Pulse: 88 83 81 77  Resp: 19 16 12 18   Temp: 97.8 F (36.6 C)  98.3 F (36.8 C)   TempSrc: Oral  Oral   SpO2: 94% 98% 99% 100%  Weight:      Height:        Examination:  General exam: Pleasant middle-aged male, chronically ill-looking.He is lying comfortably propped up in bed without distress. Respiratory system: Slightly diminished breath sounds in the bases but otherwise clear to auscultation. Tracheostomy/tract collar at FiO2 30%. Cardiovascular system: S1 & S2 heard, RRR. No JVD, murmurs, rubs, gallops or clicks. No pedal edema. Telemetry: Sinus rhythm with frequent PVCs. NSVT 15 beats at 4 PM on 6/1 Gastrointestinal system: Abdomen is nondistended, soft and nontender. No organomegaly or masses felt. Normal bowel sounds heard. Central nervous system: Alert and oriented to self. Follows simple instructions. No focal neurological deficits. Extremities: Right limbs grade 5 x 5 power. Left limbs at least grade 2 x 5 power but has contractures. Mildly tender in distal left thigh without any other acute findings. Small linear cut in the left popliteal foci without acute findings of infection. Skin: Exam as per wound care RN on 5/30 Psychiatry: Judgement and insight impaired. Mood & affect appropriate.     Data Reviewed: I have personally reviewed following labs and imaging studies  CBC:  Recent Labs Lab 03/19/17 0849 03/19/17 1339 03/20/17 0135 03/21/17 0519  03/22/17 0140 03/23/17 0303  WBC 17.1*  --  13.2* 12.1* 11.3* 12.0*  NEUTROABS  --  9.1*  --   --   --   --   HGB 9.8*  --  9.7* 10.1* 10.0* 10.5*  HCT 31.7*  --  31.7* 32.6* 32.1* 34.4*  MCV 102.6*  --  102.6* 103.5* 104.6* 103.3*  PLT 243  --  280 310 312 327   Basic Metabolic Panel:  Recent Labs Lab 03/19/17 0849  03/22/17 0140 03/23/17 0303 03/24/17 0224 03/24/17 1832 03/25/17 0213  NA 142  < > 142 141 146* 144 146*  K 3.5  < > 3.9 4.0 3.8 4.0 3.7  CL 109  < > 106 104 107 104 105  CO2 27  < > 29 30 32 31 33*  GLUCOSE 164*  < > 120* 134* 119* 185* 112*  BUN 46*  < > 52* 55* 59* 62* 65*  CREATININE 1.82*  < > 1.78* 1.80* 1.77* 1.76* 1.68*  CALCIUM 9.6  < > 9.5 9.4 9.5 9.6 9.6  MG 2.4  --   --   --   --   --   --   < > = values in this interval not displayed. Liver Function Tests:  Recent Labs Lab 03/19/17 1339  AST 31  ALT 21  ALKPHOS 86  BILITOT 0.2*  PROT 7.1  ALBUMIN 1.9*   Coagulation Profile:  Recent Labs Lab 03/21/17 0519 03/22/17 0140 03/23/17 0303 03/24/17 0224 03/25/17 0213  INR 2.46 2.63 2.75 2.81 3.19   CBG:  Recent Labs Lab 03/24/17 1937 03/24/17 2337 03/25/17 0332 03/25/17 0710 03/25/17 1143  GLUCAP  148* 122* 129* 154* 117*    Recent Results (from the past 240 hour(s))  Culture, blood (Routine X 2) w Reflex to ID Panel     Status: None   Collection Time: 03/17/17  5:27 PM  Result Value Ref Range Status   Specimen Description BLOOD RIGHT ARM  Final   Special Requests IN PEDIATRIC BOTTLE Blood Culture adequate volume  Final   Culture NO GROWTH 5 DAYS  Final   Report Status 03/22/2017 FINAL  Final  Culture, blood (Routine X 2) w Reflex to ID Panel     Status: None   Collection Time: 03/17/17  5:32 PM  Result Value Ref Range Status   Specimen Description BLOOD RIGHT ARM  Final   Special Requests IN PEDIATRIC BOTTLE Blood Culture adequate volume  Final   Culture NO GROWTH 5 DAYS  Final   Report Status 03/22/2017 FINAL  Final    MRSA PCR Screening     Status: Abnormal   Collection Time: 03/17/17  9:17 PM  Result Value Ref Range Status   MRSA by PCR POSITIVE (A) NEGATIVE Final    Comment:        The GeneXpert MRSA Assay (FDA approved for NASAL specimens only), is one component of a comprehensive MRSA colonization surveillance program. It is not intended to diagnose MRSA infection nor to guide or monitor treatment for MRSA infections. RESULT CALLED TO, READ BACK BY AND VERIFIED WITH: RN, Jamelle Rushing [id number] @[phone]  THANEY   Culture, respiratory (NON-Expectorated)     Status: None   Collection Time: 03/19/17 11:55 AM  Result Value Ref Range Status   Specimen Description TRACHEAL ASPIRATE  Final   Special Requests NONE  Final   Gram Stain   Final    FEW WBC PRESENT,BOTH PMN AND MONONUCLEAR MODERATE YEAST    Culture MODERATE CANDIDA GLABRATA  Final   Report Status 03/21/2017 FINAL  Final         Radiology Studies: Dg Chest Port 1 View  Result Date: 03/24/2017 CLINICAL DATA:  Hypoxia EXAM: PORTABLE CHEST 1 VIEW COMPARISON:  Two days ago FINDINGS: Tracheostomy tube in place. Cardiomegaly and diffuse interstitial opacity. Low lung volumes. Small layering pleural effusions are not excluded. No evidence of pneumothorax. IMPRESSION: 1. Cardiomegaly and mild pulmonary edema. 2. Low volume chest with atelectasis. 3. No change from study 2 days prior. Electronically Signed   By: Marnee Spring M.D.   On: 03/24/2017 14:47        Scheduled Meds: . aspirin  81 mg Per Tube Daily  . atorvastatin  40 mg Per Tube QHS  . chlorhexidine  15 mL Mouth Rinse BID  . feeding supplement (PRO-STAT SUGAR FREE 64)  30 mL Per Tube BID  . FLUoxetine  60 mg Per Tube Daily  . furosemide  60 mg Intravenous BID  . insulin aspart  0-15 Units Subcutaneous Q4H  . mouth rinse  15 mL Mouth Rinse q12n4p  . metoCLOPramide  5 mg Per Tube TID AC  . OLANZapine  5 mg Per Tube BID  . sodium chloride flush  3 mL Intravenous Q12H  .  Valproate Sodium  250 mg Per Tube TID  . warfarin  3 mg Oral ONCE-1800  . Warfarin - Pharmacist Dosing Inpatient   Does not apply q1800   Continuous Infusions: . sodium chloride 250 mL (03/24/17 2000)  . feeding supplement (OSMOLITE 1.5 CAL) 1,000 mL (03/25/17 0600)     LOS: 7 days     HONGALGI,ANAND, MD,  FACP, FHM. [facility] Pager [phone]  If 7PM-7AM, please contact night-coverage [email or web address] Password [facility] 03/25/2017, 12:55 PM

## 2017-03-26 DIAGNOSIS — N179 Acute kidney failure, unspecified: Secondary | ICD-10-CM

## 2017-03-26 DIAGNOSIS — N189 Chronic kidney disease, unspecified: Secondary | ICD-10-CM

## 2017-03-26 LAB — GLUCOSE, CAPILLARY
GLUCOSE-CAPILLARY: 156 mg/dL — AB (ref 65–99)
GLUCOSE-CAPILLARY: 139 mg/dL — AB (ref 65–99)
GLUCOSE-CAPILLARY: 133 mg/dL — AB (ref 65–99)
Glucose-Capillary: 91 mg/dL (ref 65–99)
Glucose-Capillary: 160 mg/dL — ABNORMAL HIGH (ref 65–99)

## 2017-03-26 LAB — BASIC METABOLIC PANEL
ANION GAP: 4 — AB (ref 5–15)
BUN: 76 mg/dL — ABNORMAL HIGH (ref 6–20)
CHLORIDE: 104 mmol/L (ref 101–111)
CO2: 36 mmol/L — AB (ref 22–32)
Calcium: 9.9 mg/dL (ref 8.9–10.3)
Creatinine, Ser: 1.94 mg/dL — ABNORMAL HIGH (ref 0.61–1.24)
GFR calc non Af Amer: 35 mL/min — ABNORMAL LOW (ref >60)
GFR, EST AFRICAN AMERICAN: 41 mL/min — AB (ref >60)
Glucose, Bld: 105 mg/dL — ABNORMAL HIGH (ref 65–99)
POTASSIUM: 3.8 mmol/L (ref 3.5–5.1)
SODIUM: 144 mmol/L (ref 135–145)

## 2017-03-26 LAB — CBC
HCT: 36.2 % — ABNORMAL LOW (ref 39.0–52.0)
HEMOGLOBIN: 10.9 g/dL — AB (ref 13.0–17.0)
MCH: 31.5 pg (ref 26.0–34.0)
MCHC: 30.1 g/dL (ref 30.0–36.0)
MCV: 104.6 fL — ABNORMAL HIGH (ref 78.0–100.0)
Platelets: 363 10*3/uL (ref 150–400)
RBC: 3.46 MIL/uL — AB (ref 4.22–5.81)
RDW: 15.7 % — ABNORMAL HIGH (ref 11.5–15.5)
WBC: 10.8 10*3/uL — ABNORMAL HIGH (ref 4.0–10.5)

## 2017-03-26 LAB — PROTIME-INR
INR: 2.89
Prothrombin Time: 30.8 seconds — ABNORMAL HIGH (ref 11.4–15.2)

## 2017-03-26 MED ORDER — FUROSEMIDE 10 MG/ML IJ SOLN
60.0000 mg | Freq: Every day | INTRAMUSCULAR | Status: DC
  Filled 2017-03-26: qty 6

## 2017-03-26 MED ORDER — WARFARIN SODIUM 6 MG PO TABS: 6.0000 mg | ORAL_TABLET | Freq: Once | ORAL | Status: DC

## 2017-03-26 MED ORDER — WARFARIN SODIUM 6 MG PO TABS
6.0000 mg | ORAL_TABLET | Freq: Once | ORAL | Status: AC
  Administered 2017-03-26: 6 mg
  Filled 2017-03-26: qty 1

## 2017-03-26 NOTE — Progress Notes (Signed)
Manual chest PT not performed at this time due to patient being cleaned up. Suctioned out thick yellow secretions. RT will continue to monitor.

## 2017-03-26 NOTE — Clinical Social Work Note (Signed)
Dispo plan is for pt to return to [facility] at d/c. Pt still on trach at 21%. Per MD note project d/c early next week. CSW following.   [place], [place] [phone]

## 2017-03-26 NOTE — Progress Notes (Signed)
ANTICOAGULATION CONSULT NOTE  Pharmacy Consult for Warfarin Indication: history of mural apical thrombus  Patient Measurements: Height: 6\' 2"  (188 cm) Weight: 203 lb 14.8 oz (92.5 kg) IBW/kg (Calculated) : 82.2  Ideal body weight: 82.2 kg (181 lb 3.5 oz) Adjusted ideal body weight: 86.3 kg (190 lb 4.8 oz)  Heparin Wt: 102 kg  Vital Signs: Temp: 97.8 F (36.6 C) (06/03 0648) Temp Source: Oral (06/03 0648) BP: 102/83 (06/03 0648) Pulse Rate: 67 (06/03 0823)  Labs:  Recent Labs  03/24/17 0224 03/24/17 1832 03/25/17 0213 03/26/17 0152  HGB  --   --   --  10.9*  HCT  --   --   --  36.2*  PLT  --   --   --  363  LABPROT 30.2*  --  33.4* 30.8*  INR 2.81  --  3.19 2.89  CREATININE 1.77* 1.76* 1.68* 1.94*    Estimated Creatinine Clearance: 45.9 mL/min (A) (by C-G formula based on SCr of 1.94 mg/dL (H)).  Assessment: 51 YOM with complicated medical history of cerebral infarction with trach and g-tube. History of apical thrombus on chronic warfarin. Pharmacy consulted to dose warfarin (s/p heparin bridge d/c'd on 5/27).   INR now therapeutic at 2.89 after decrease yesterday. Last CBC shows hemoglobin low but stable. No overt s/sx of bleeding noted. Albumin level appears to be chronically low. Unasyn course was completed 6/1. Currently being diuresed for volume overload.  PTA dose: 6mg /day  He has received reduced doses the past two days. Has been relatively stable on 6mg /day (INR was trending up slowly). Will continue back with PTA dose in anticipation of seeing reduced dose effects over next day or so.   Goal of Therapy:  INR 2-3 Monitor platelets by anticoagulation protocol: Yes   Plan:  Warfarin 6 mg x 1 dose at 1800 today Daily INR, CBC as needed Monitor for s/s bleeding   Carylon Perches, PharmD Acute Care Pharmacy Resident  Pager: [phone] 03/26/2017

## 2017-03-26 NOTE — Progress Notes (Signed)
Patient changed to humidified RA per MD request this morning.  Patient has tolerated all day with no complications, SPO2 has maintained between 90%-94%.

## 2017-03-26 NOTE — Progress Notes (Addendum)
PROGRESS NOTE   Curtis Keller  ZOX:[id number]    DOB: 1956/05/27    DOA: 03/17/2017  PCP: Kirt Boys, DO   I have briefly reviewed patients previous medical records in [facility].  Brief Narrative:  61 year old male with PMH of VDRF, tracheostomy dependent, on oxygen via trach collar FiO2 28% PTA, dysphagia s/p PEG tube feeding, COPD, CVA with residual left hemiparesis/contractures, chronic systolic CHF, dementia, recurrent falls, recent hip fracture surgery, LV apical thrombus, presented with hypoxia and noted to have suspected aspiration pneumonia. Slowly improving.Despite aggressive treatment, remains with high oxygen demands and currently on trach collar oxygen FiO2 34% and desaturates quickly on attempting to wean. Pulmonology was consulted and increased Lasix for suspected decompensated CHF contributing to higher oxygen requirements.Improving.   Assessment & Plan:   Active Problems:   Heart failure, chronic systolic (HCC)   Depression   Apical mural thrombus   Protein calorie malnutrition (HCC)   PEG (percutaneous endoscopic gastrostomy) status (HCC)   COPD (chronic obstructive pulmonary disease) (HCC)   GERD (gastroesophageal reflux disease)   Diabetes mellitus, type II, insulin dependent (HCC)   Pneumonia   HCAP (healthcare-associated pneumonia)   Pressure injury of skin   SOB (shortness of breath)   Hypoxia   1. Aspiration pneumonia: Likely related to dysphagia from prior CVA and apparently was fed by mouth at SNF. Initially started on IV vancomycin and cefepime which was changed to IV Unasyn. Strict nothing by mouth and aspiration precautions. Aggressive pulmonary toilet by RT. Tracheal aspirate showed moderate Candida. Blood cultures 2: Negative. Chest x-ray 5/30 was personally reviewed and showed improving pulmonary edema. Completed 8 days of IV antibiotics on 03/24/17. 2. Acute on chronic hypoxic respiratory failure/s/p tracheostomy from recent VDRF: Apparently  was on trach collar with FiO2 of 24-28% prior to admission. As per family, recently discharged from LTAC/[facility] and was only at [facility] for a few hours prior to deterioration and hospitalization again. Acute respiratory failure secondary to aspiration pneumonia and decompensated CHF. Despite aggressive treatment, had ongoing high oxygen requirements and pulmonology was consulted. Input appreciated. Treating for decompensated CHF with aggressive IV Lasix diuresis. Improving. FiO2 down to 28% on 6/3. Discussed with RT to wean further to humidified room air as tolerated. 3. Acute on chronic systolic CHF: Not on diuretics PTA. Although clinically does not appear volume overloaded, chest x-ray seemed to suggest pulmonary edema which is slowly improving. Lasix increased by pulmonology to 60 mg 2 times daily. Suspect intake output is not being adequately charted. Only -100 since admission. 4. LV apical mural thrombus: INR was initially subtherapeutic and required heparin bridge. INR became supratherapeutic and being managed by pharmacy with reduced dosing. INR improved today. 5. Stage III chronic kidney disease: Creatinine stable in the 1.7-1.8 range. Creatinine jumped up from 1.68-1.94 (acute kidney injury superimposed on chronic kidney disease). Hold Lasix for today and consider transitioning to oral Lasix 6/4. 6. Mood disorder/dementia: Continue Depakote, Zyprexa and fluoxetine. Mental status probably at baseline. 7. RUE swelling: Venous Doppler 5/29: No DVT or SVT. 8. Buttock lesion: As per wound care consultation 5/30, generalized erythema, left buttock with partial-thickness linear ablation of unknown etiology and the appearance is not consistent with a pressure injury. Continue foam dressing as per their recommendations. 9. Hypokalemia: Replaced. 10. Anemia, chronic: Macrocytic. May be of chronic disease. Stable. 11. CVA with received dual left hemiparesis: Continue aspirin, statins, warfarin  anticoagulation. 12. Hyperglycemia: Without diagnosis of DM. On SSI. A1c 5.3.. Reasonable inpatient control. 13. NSVT:  Replace potassium (4 and above) and magnesium (2 and above). Resumed metoprolol. No further episodes.   DVT prophylaxis: Anticoagulated on warfarin Code Status: Full Family Communication: None at bedside today.  Disposition: SNF when medically improved and stable, possibly 6/4.   Consultants:  Pulmonology  Procedures:  Has tracheostomy and PEG tube since prior to admission.  Antimicrobials:  IV Unasyn -discontinued   Subjective: No chest pain, dyspnea reported. As per nursing, doing well without acute issues. Occasional dry cough. Tolerating tube feeds.  ROS: No nausea, vomiting or dysuria reported.  Objective:  Vitals:   03/26/17 0245 03/26/17 0400 03/26/17 0648 03/26/17 0823  BP: 112/71  102/83   Pulse: 72 68 66 67  Resp: 18 20 18 14   Temp: 97.7 F (36.5 C)  97.8 F (36.6 C)   TempSrc: Oral  Oral   SpO2: 95% 98% 96% 97%  Weight: 92.5 kg (203 lb 14.8 oz)     Height:        Examination:  General exam: Pleasant middle-aged male, chronically ill-looking lying comfortably propped up in bed. Looks better than he did mid last week. Respiratory system: Clear to auscultation without wheezing, rhonchi or crackles. Tracheostomy/tract collar at FiO2 28 %. Cardiovascular system: S1 & S2 heard, RRR. No JVD, murmurs, rubs, gallops or clicks. No pedal edema. Telemetry: Sinus rhythm with frequent PVCs. No further NSVT noted. Gastrointestinal system: Abdomen is nondistended, soft and nontender. No organomegaly or masses felt. Normal bowel sounds heard. Central nervous system: Alert and oriented to self. Follows simple instructions. No focal neurological deficits. Extremities: Right limbs grade 5 x 5 power. Left limbs at least grade 2 x 5 power but has contractures. Mildly tender in distal left thigh without any other acute findings. Small linear cut in the left  popliteal foci without acute findings of infection. Skin: Exam as per wound care RN on 5/30 Psychiatry: Judgement and insight impaired. Mood & affect appropriate.     Data Reviewed: I have personally reviewed following labs and imaging studies  CBC:  Recent Labs Lab 03/19/17 1339 03/20/17 0135 03/21/17 0519 03/22/17 0140 03/23/17 0303 03/26/17 0152  WBC  --  13.2* 12.1* 11.3* 12.0* 10.8*  NEUTROABS 9.1*  --   --   --   --   --   HGB  --  9.7* 10.1* 10.0* 10.5* 10.9*  HCT  --  31.7* 32.6* 32.1* 34.4* 36.2*  MCV  --  102.6* 103.5* 104.6* 103.3* 104.6*  PLT  --  280 310 312 327 363   Basic Metabolic Panel:  Recent Labs Lab 03/23/17 0303 03/24/17 0224 03/24/17 1832 03/25/17 0213 03/26/17 0152  NA 141 146* 144 146* 144  K 4.0 3.8 4.0 3.7 3.8  CL 104 107 104 105 104  CO2 30 32 31 33* 36*  GLUCOSE 134* 119* 185* 112* 105*  BUN 55* 59* 62* 65* 76*  CREATININE 1.80* 1.77* 1.76* 1.68* 1.94*  CALCIUM 9.4 9.5 9.6 9.6 9.9  MG  --   --   --  2.5*  --    Liver Function Tests:  Recent Labs Lab 03/19/17 1339  AST 31  ALT 21  ALKPHOS 86  BILITOT 0.2*  PROT 7.1  ALBUMIN 1.9*   Coagulation Profile:  Recent Labs Lab 03/22/17 0140 03/23/17 0303 03/24/17 0224 03/25/17 0213 03/26/17 0152  INR 2.63 2.75 2.81 3.19 2.89   CBG:  Recent Labs Lab 03/25/17 1714 03/25/17 1909 03/25/17 2333 03/26/17 0244 03/26/17 0753  GLUCAP 153* 133* 105* 156* 139*  Recent Results (from the past 240 hour(s))  Culture, blood (Routine X 2) w Reflex to ID Panel     Status: None   Collection Time: 03/17/17  5:27 PM  Result Value Ref Range Status   Specimen Description BLOOD RIGHT ARM  Final   Special Requests IN PEDIATRIC BOTTLE Blood Culture adequate volume  Final   Culture NO GROWTH 5 DAYS  Final   Report Status 03/22/2017 FINAL  Final  Culture, blood (Routine X 2) w Reflex to ID Panel     Status: None   Collection Time: 03/17/17  5:32 PM  Result Value Ref Range Status    Specimen Description BLOOD RIGHT ARM  Final   Special Requests IN PEDIATRIC BOTTLE Blood Culture adequate volume  Final   Culture NO GROWTH 5 DAYS  Final   Report Status 03/22/2017 FINAL  Final  MRSA PCR Screening     Status: Abnormal   Collection Time: 03/17/17  9:17 PM  Result Value Ref Range Status   MRSA by PCR POSITIVE (A) NEGATIVE Final    Comment:        The GeneXpert MRSA Assay (FDA approved for NASAL specimens only), is one component of a comprehensive MRSA colonization surveillance program. It is not intended to diagnose MRSA infection nor to guide or monitor treatment for MRSA infections. RESULT CALLED TO, READ BACK BY AND VERIFIED WITH: RN, Jamelle Rushing [id number] @2326  THANEY   Culture, respiratory (NON-Expectorated)     Status: None   Collection Time: 03/19/17 11:55 AM  Result Value Ref Range Status   Specimen Description TRACHEAL ASPIRATE  Final   Special Requests NONE  Final   Gram Stain   Final    FEW WBC PRESENT,BOTH PMN AND MONONUCLEAR MODERATE YEAST    Culture MODERATE CANDIDA GLABRATA  Final   Report Status 03/21/2017 FINAL  Final         Radiology Studies: Dg Chest Port 1 View  Result Date: 03/24/2017 CLINICAL DATA:  Hypoxia EXAM: PORTABLE CHEST 1 VIEW COMPARISON:  Two days ago FINDINGS: Tracheostomy tube in place. Cardiomegaly and diffuse interstitial opacity. Low lung volumes. Small layering pleural effusions are not excluded. No evidence of pneumothorax. IMPRESSION: 1. Cardiomegaly and mild pulmonary edema. 2. Low volume chest with atelectasis. 3. No change from study 2 days prior. Electronically Signed   By: Marnee Spring M.D.   On: 03/24/2017 14:47        Scheduled Meds: . aspirin  81 mg Per Tube Daily  . atorvastatin  40 mg Per Tube QHS  . chlorhexidine  15 mL Mouth Rinse BID  . famotidine  20 mg Per Tube BID  . feeding supplement (PRO-STAT SUGAR FREE 64)  30 mL Per Tube BID  . FLUoxetine  60 mg Per Tube Daily  . folic acid  1 mg Per  Tube Daily  . furosemide  60 mg Intravenous Daily  . insulin aspart  0-15 Units Subcutaneous Q4H  . mouth rinse  15 mL Mouth Rinse q12n4p  . metoCLOPramide  5 mg Per Tube TID AC  . metoprolol tartrate  25 mg Per Tube BID  . multivitamin with minerals  1 tablet Per Tube Daily  . OLANZapine  5 mg Per Tube BID  . polyethylene glycol  17 g Per Tube Daily  . senna  1 tablet Per Tube BID  . sodium chloride flush  3 mL Intravenous Q12H  . Valproate Sodium  250 mg Per Tube TID  . cyanocobalamin  1,000 mcg  Per Tube Daily  . warfarin  6 mg Per Tube ONCE-1800  . Warfarin - Pharmacist Dosing Inpatient   Does not apply q1800   Continuous Infusions: . sodium chloride 250 mL (03/25/17 2000)  . feeding supplement (OSMOLITE 1.5 CAL) 1,000 mL (03/26/17 0500)     LOS: 8 days     Miche Loughridge, MD, FACP, FHM. [facility] Pager [phone]  If 7PM-7AM, please contact night-coverage [email or web address] Password [email or web address] 03/26/2017, 11:53 AM

## 2017-03-27 ENCOUNTER — Inpatient Hospital Stay (HOSPITAL_COMMUNITY): Payer: Medicare Other

## 2017-03-27 DIAGNOSIS — I5022 Chronic systolic (congestive) heart failure: Secondary | ICD-10-CM

## 2017-03-27 LAB — GLUCOSE, CAPILLARY
GLUCOSE-CAPILLARY: 136 mg/dL — AB (ref 65–99)
GLUCOSE-CAPILLARY: 105 mg/dL — AB (ref 65–99)
Glucose-Capillary: 133 mg/dL — ABNORMAL HIGH (ref 65–99)
Glucose-Capillary: 144 mg/dL — ABNORMAL HIGH (ref 65–99)
Glucose-Capillary: 124 mg/dL — ABNORMAL HIGH (ref 65–99)

## 2017-03-27 LAB — BASIC METABOLIC PANEL
Anion gap: 8 (ref 5–15)
BUN: 81 mg/dL — AB (ref 6–20)
CHLORIDE: 105 mmol/L (ref 101–111)
CO2: 34 mmol/L — ABNORMAL HIGH (ref 22–32)
CREATININE: 2.09 mg/dL — AB (ref 0.61–1.24)
Calcium: 9.7 mg/dL (ref 8.9–10.3)
GFR calc Af Amer: 37 mL/min — ABNORMAL LOW (ref >60)
GFR calc non Af Amer: 32 mL/min — ABNORMAL LOW (ref >60)
GLUCOSE: 116 mg/dL — AB (ref 65–99)
Potassium: 3.7 mmol/L (ref 3.5–5.1)
SODIUM: 147 mmol/L — AB (ref 135–145)

## 2017-03-27 LAB — PROTIME-INR
INR: 2.68
Prothrombin Time: 29 seconds — ABNORMAL HIGH (ref 11.4–15.2)

## 2017-03-27 MED ORDER — FREE WATER
100.0000 mL | Status: DC
  Administered 2017-03-27 – 2017-03-29 (×12): 100 mL

## 2017-03-27 MED ORDER — WARFARIN SODIUM 6 MG PO TABS
6.0000 mg | ORAL_TABLET | Freq: Once | ORAL | Status: AC
  Administered 2017-03-27: 6 mg
  Filled 2017-03-27 (×2): qty 1

## 2017-03-27 MED ORDER — METOPROLOL TARTRATE 25 MG/10 ML ORAL SUSPENSION
37.5000 mg | Freq: Two times a day (BID) | ORAL | Status: DC
  Administered 2017-03-27 – 2017-03-30 (×5): 37.5 mg
  Filled 2017-03-27 (×6): qty 20

## 2017-03-27 MED ORDER — DEXTROSE 5 % IV SOLN
INTRAVENOUS | Status: AC
  Administered 2017-03-27: 13:00:00 via INTRAVENOUS

## 2017-03-27 NOTE — Progress Notes (Signed)
Tele called NT that then told this RN that patient's rhythm was starting to look "funky". RN went in and assess patient and asked him if he felt like his heart was skipping beats. Patient denied feeling anything abnormal and mouthed that he was just fine. Will continue to monitor

## 2017-03-27 NOTE — Progress Notes (Signed)
PROGRESS NOTE   Curtis Keller  LHT:[id number]    DOB: October 10, 1956    DOA: 03/17/2017  PCP: Kirt Boys, DO   I have briefly reviewed patients previous medical records in [facility].  Brief Narrative:  61 year old male with PMH of VDRF, tracheostomy dependent, on oxygen via trach collar FiO2 28% PTA, dysphagia s/p PEG tube feeding, COPD, CVA with residual left hemiparesis/contractures, chronic systolic CHF, dementia, recurrent falls, recent hip fracture surgery, LV apical thrombus, presented with hypoxia and noted to have suspected aspiration pneumonia. Slowly improving.Despite aggressive treatment, had high oxygen demands hence Pulmonology was consulted and increased Lasix for suspected decompensated CHF. Respiratory status is improved. Mild AKI, lasix held. briefly hydrating. Possible discharge to SNF 6/5 pending improvement in renal functions.   Assessment & Plan:   Active Problems:   Heart failure, chronic systolic (HCC)   Depression   Apical mural thrombus   Protein calorie malnutrition (HCC)   PEG (percutaneous endoscopic gastrostomy) status (HCC)   COPD (chronic obstructive pulmonary disease) (HCC)   GERD (gastroesophageal reflux disease)   Diabetes mellitus, type II, insulin dependent (HCC)   Pneumonia   HCAP (healthcare-associated pneumonia)   Pressure injury of skin   SOB (shortness of breath)   Hypoxia   1. Aspiration pneumonia: Likely related to dysphagia from prior CVA and apparently was fed by mouth at SNF. Initially started on IV vancomycin and cefepime which was changed to IV Unasyn. Strict nothing by mouth and aspiration precautions. Aggressive pulmonary toilet by RT. Tracheal aspirate showed moderate Candida. Blood cultures 2: Negative. Completed 8 days of IV antibiotics on 03/24/17. Continue pulmonary toilet and strict aspiration precautions. Personally reviewed repeat chest x-ray 6/4: Likely bibasal atelectasis rather than pneumonia in the clinical  context. 2. Acute on chronic hypoxic respiratory failure/s/p tracheostomy from recent VDRF: Apparently was on trach collar with FiO2 of 24-28% prior to admission. As per family, recently discharged from LTAC[facility] and was only at [facility] for a few hours prior to deterioration and hospitalization again. Acute respiratory failure secondary to aspiration pneumonia and decompensated CHF. Despite aggressive treatment, had ongoing high oxygen requirements and pulmonology was consulted. Treated for decompensated CHF with aggressive IV Lasix diuresis. Hypoxia resolved. Lasix had to be discontinued after 6/3 doses d/t AKI. Pulmonology follow-up appreciated. Discussed with PCCM >recommend humidified trach collar at discharge to prevent mucous plugging and patient is on room air. 3. Acute on chronic systolic CHF: Not on diuretics PTA. Although clinically does not appear volume overloaded, chest x-ray seemed to suggest pulmonary edema which is slowly improving. Lasix increased by pulmonology to 60 mg 2 times daily. Improved. Lasix discontinued after 6/3 doses due to acute kidney injury. May need low-dose Lasix at discharge. No ACEI/ARB due to renal insufficiency 4. LV apical mural thrombus: INR was initially subtherapeutic and required heparin bridge. INR became supratherapeutic and being managed by pharmacy with reduced dosing. INR therapeutic. 5. Acute on Stage III chronic kidney disease: Creatinine was stable in the 1.7-1.8 range. Creatinine jumped up from 1.68>1.94>2.09.Lasix discontinued after 6/3 doses due to worsening creatinine. In brief IV D5 infusion and free water through PEG tube. Follow BMP in a.m. Recommend low-dose Lasix via PEG tube at discharge pending improvement in renal function to treat chronic CHF.  6. Mood disorder/dementia: Continue Depakote, Zyprexa and fluoxetine. Mental status probably at baseline. 7. RUE swelling: Venous Doppler 5/29: No DVT or SVT. 8. Buttock lesion: As per wound care  consultation 5/30, generalized erythema, left buttock with partial-thickness linear ablation  of unknown etiology and the appearance is not consistent with a pressure injury. Continue foam dressing as per their recommendations. 9. Hypokalemia: Replaced. 10. Anemia, chronic: Macrocytic. May be of chronic disease. Stable. 11. CVA with received dual left hemiparesis: Continue aspirin, statins, warfarin anticoagulation. 12. Hyperglycemia: Without diagnosis of DM. On SSI. A1c 5.3.. Reasonable inpatient control. 13. NSVT: Replace potassium (4 and above) and magnesium (2 and above). Increase metoprolol to 37.5 MG twice a day. No ACEI/ARB due to renal insufficiency.    DVT prophylaxis: Anticoagulated on warfarin Code Status: Full Family Communication: None at bedside today.  Disposition: SNF when medically improved and stable, possibly 6/5.   Consultants:  Pulmonology  Procedures:  Has tracheostomy and PEG tube since prior to admission.  Antimicrobials:  IV Unasyn -discontinued   Subjective: Seen this morning. Denied complaints. As per RN, no acute issues reported. Denies dyspnea, chest pain or palpitations.   ROS: No nausea, vomiting or dysuria reported.  Objective:  Vitals:   03/27/17 0700 03/27/17 0744 03/27/17 1115 03/27/17 1148  BP: 106/77 106/77  110/68  Pulse: 75 76 72 70  Resp: 18 16 20 16   Temp: 97.8 F (36.6 C)   98.3 F (36.8 C)  TempSrc: Oral   Oral  SpO2: 95% 95% 96% 92%  Weight:      Height:        Examination:  General exam: Pleasant middle-aged male, chronically ill-looking lying comfortably propped up in bed. Looks better than he did mid last week. Respiratory system: Reduced breath sounds in the bases but no wheezing, rhonchi or crackles. Tracheostomy/tract collar and no one humidified room air. Cardiovascular system: S1 & S2 heard, RRR. No JVD, murmurs, rubs, gallops or clicks. No pedal edema. Telemetry: Sinus rhythm with frequent PVCs. Brief NSVT noted at  1:05 PM on 6/3.  Gastrointestinal system: Abdomen is nondistended, soft and nontender. No organomegaly or masses felt. Normal bowel sounds heard. Central nervous system: Alert and oriented to self. Follows simple instructions. No focal neurological deficits. Extremities: Right limbs grade 5 x 5 power. Left limbs at least grade 2 x 5 power but has contractures. Mildly tender in distal left thigh without any other acute findings. Small linear cut in the left popliteal foci without acute findings of infection. Skin: Exam as per wound care RN on 5/30 Psychiatry: Judgement and insight impaired. Mood & affect appropriate.     Data Reviewed: I have personally reviewed following labs and imaging studies  CBC:  Recent Labs Lab 03/21/17 0519 03/22/17 0140 03/23/17 0303 03/26/17 0152  WBC 12.1* 11.3* 12.0* 10.8*  HGB 10.1* 10.0* 10.5* 10.9*  HCT 32.6* 32.1* 34.4* 36.2*  MCV 103.5* 104.6* 103.3* 104.6*  PLT 310 312 327 363   Basic Metabolic Panel:  Recent Labs Lab 03/24/17 0224 03/24/17 1832 03/25/17 0213 03/26/17 0152 03/27/17 0343  NA 146* 144 146* 144 147*  K 3.8 4.0 3.7 3.8 3.7  CL 107 104 105 104 105  CO2 32 31 33* 36* 34*  GLUCOSE 119* 185* 112* 105* 116*  BUN 59* 62* 65* 76* 81*  CREATININE 1.77* 1.76* 1.68* 1.94* 2.09*  CALCIUM 9.5 9.6 9.6 9.9 9.7  MG  --   --  2.5*  --   --    Coagulation Profile:  Recent Labs Lab 03/23/17 0303 03/24/17 0224 03/25/17 0213 03/26/17 0152 03/27/17 0343  INR 2.75 2.81 3.19 2.89 2.68   CBG:  Recent Labs Lab 03/26/17 1917 03/26/17 2313 03/27/17 0407 03/27/17 0724 03/27/17 1200  GLUCAP  91 160* 136* 133* 105*    Recent Results (from the past 240 hour(s))  Culture, blood (Routine X 2) w Reflex to ID Panel     Status: None   Collection Time: 03/17/17  5:27 PM  Result Value Ref Range Status   Specimen Description BLOOD RIGHT ARM  Final   Special Requests IN PEDIATRIC BOTTLE Blood Culture adequate volume  Final   Culture NO  GROWTH 5 DAYS  Final   Report Status 03/22/2017 FINAL  Final  Culture, blood (Routine X 2) w Reflex to ID Panel     Status: None   Collection Time: 03/17/17  5:32 PM  Result Value Ref Range Status   Specimen Description BLOOD RIGHT ARM  Final   Special Requests IN PEDIATRIC BOTTLE Blood Culture adequate volume  Final   Culture NO GROWTH 5 DAYS  Final   Report Status 03/22/2017 FINAL  Final  MRSA PCR Screening     Status: Abnormal   Collection Time: 03/17/17  9:17 PM  Result Value Ref Range Status   MRSA by PCR POSITIVE (A) NEGATIVE Final    Comment:        The GeneXpert MRSA Assay (FDA approved for NASAL specimens only), is one component of a comprehensive MRSA colonization surveillance program. It is not intended to diagnose MRSA infection nor to guide or monitor treatment for MRSA infections. RESULT CALLED TO, READ BACK BY AND VERIFIED WITH: RN, Jamelle Rushing [id number] @2326  THANEY   Culture, respiratory (NON-Expectorated)     Status: None   Collection Time: 03/19/17 11:55 AM  Result Value Ref Range Status   Specimen Description TRACHEAL ASPIRATE  Final   Special Requests NONE  Final   Gram Stain   Final    FEW WBC PRESENT,BOTH PMN AND MONONUCLEAR MODERATE YEAST    Culture MODERATE CANDIDA GLABRATA  Final   Report Status 03/21/2017 FINAL  Final         Radiology Studies: Dg Chest Port 1 View  Result Date: 03/27/2017 CLINICAL DATA:  Shortness of Breath EXAM: PORTABLE CHEST 1 VIEW COMPARISON:  March 24, 2017 FINDINGS: Tracheostomy catheter tip is 4.5 cm above the carina. No pneumothorax. There is patchy airspace consolidation in the left base. There is mild bibasilar atelectasis as well. Heart is borderline enlarged with pulmonary vascularity within normal limits. No adenopathy. No bone lesions. IMPRESSION: Tracheostomy as described without pneumothorax. Patchy airspace opacity left base, suspect a degree of pneumonia. Patchy atelectasis in the bases as well. Stable cardiac  silhouette. Electronically Signed   By: Bretta Bang III M.D.   On: 03/27/2017 09:32        Scheduled Meds: . aspirin  81 mg Per Tube Daily  . atorvastatin  40 mg Per Tube QHS  . chlorhexidine  15 mL Mouth Rinse BID  . famotidine  20 mg Per Tube BID  . feeding supplement (PRO-STAT SUGAR FREE 64)  30 mL Per Tube BID  . FLUoxetine  60 mg Per Tube Daily  . folic acid  1 mg Per Tube Daily  . free water  100 mL Per Tube Q4H  . insulin aspart  0-15 Units Subcutaneous Q4H  . mouth rinse  15 mL Mouth Rinse q12n4p  . metoprolol tartrate  37.5 mg Per Tube BID  . multivitamin with minerals  1 tablet Per Tube Daily  . OLANZapine  5 mg Per Tube BID  . polyethylene glycol  17 g Per Tube Daily  . senna  1 tablet Per Tube  BID  . sodium chloride flush  3 mL Intravenous Q12H  . Valproate Sodium  250 mg Per Tube TID  . cyanocobalamin  1,000 mcg Per Tube Daily  . warfarin  6 mg Per Tube ONCE-1800  . Warfarin - Pharmacist Dosing Inpatient   Does not apply q1800   Continuous Infusions: . sodium chloride 250 mL (03/25/17 2000)  . dextrose    . feeding supplement (OSMOLITE 1.5 CAL) 1,000 mL (03/27/17 0640)     LOS: 9 days     Hiro Vipond, MD, FACP, FHM. [facility] Pager [phone]  If 7PM-7AM, please contact night-coverage [email or web address] Password [facility] 03/27/2017, 12:38 PM

## 2017-03-27 NOTE — Progress Notes (Signed)
Pt trying to get OOB. States he wants a shower. Also complains of pain in hip and back 8/10. Reminded pt he could not ambulate at this time. Pt given oxy 10mg  for pain. Re positioned pt in bed. Will cont to monitor.

## 2017-03-27 NOTE — Clinical Social Work Note (Signed)
Per hospital liason for [facility], the facility discussed transferring to [facility] due to suctioning needs with patient's sister. Hospital liason now states that the patient's sister is agreeable to the patient going to [facility]. Patient transferred from 4E to 5W. CSW provided handoff information to 5W CSW.  This CSW signing off.  Charlynn Court, CSW [phone]

## 2017-03-27 NOTE — [place] FL2 (Signed)
[place] MEDICAID FL2 LEVEL OF CARE SCREENING TOOL     IDENTIFICATION  Patient Name: Curtis Keller Birthdate: 02/21/1956 Sex: male Admission Date (Current Location): 03/17/2017  [facility] and [place] Number:   Ignacia Palma)   Facility and Address:  [facility], [address], [place], [place] [address]      Provider Number: [id number]  Attending Physician Name and Address:  Elease Etienne, MD  Relative Name and Phone Number:       Current Level of Care: Hospital Recommended Level of Care: Skilled Nursing Facility Prior Approval Number:    Date Approved/Denied:   PASRR Number: [id number]  Discharge Plan: SNF    Current Diagnoses: Patient Active Problem List   Diagnosis Date Noted  . SOB (shortness of breath)   . Hypoxia   . Pressure injury of skin 03/20/2017  . HCAP (healthcare-associated pneumonia) 03/18/2017  . Pneumonia 03/17/2017  . 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 Depression with anxiety 01/23/2017  . Encounter for hospice care discussion   . DNR (do not resuscitate)   . Ischemic cardiomyopathy   . Goals of care, counseling/discussion   . Palliative care encounter   . Acute systolic congestive heart failure (HCC)   . Acute and chronic respiratory failure with hypoxia (HCC) 11/28/2016  . Hip fracture (HCC) 11/27/2016  . Closed nondisplaced intertrochanteric fracture of left femur (HCC) 11/27/2016  . Chronic respiratory failure (HCC) 11/27/2016  . Fall at nursing home 07/17/2016  . Diabetes mellitus, type II, insulin dependent (HCC) 06/12/2016  . GERD (gastroesophageal reflux disease) 05/07/2016  . COPD (chronic obstructive pulmonary disease) (HCC) 04/18/2016  . Chronic anticoagulation 03/22/2016  . S/P splenectomy 03/13/2016  . PEG (percutaneous endoscopic gastrostomy) status (HCC) 03/13/2016  . MRSA (methicillin resistant Staphylococcus aureus) septicemia (HCC) 03/13/2016  . Aspiration pneumonia  (HCC) 03/13/2016  . Dysphagia 03/13/2016  . Acute blood loss anemia 03/13/2016  . Hyperlipidemia 03/13/2016  . Heart failure, chronic systolic (HCC)   . Hypertension   . Depression   . Status post tracheostomy (HCC)   . Cardiomyopathy (HCC)   . Apical mural thrombus   . Protein calorie malnutrition (HCC)   . Intraparenchymal hematoma of brain due to trauma [facility])     Orientation RESPIRATION BLADDER Height & Weight      (Unable to assess due to tracheostomy)  Tracheostomy (Oxygen 6L per min at 21%. Suctioning prn but RN says it averages to 4 x q 12 hour shift.) Incontinent, External catheter Weight: 205 lb 0.4 oz (93 kg) Height:  6\' 2"  (188 cm)  BEHAVIORAL SYMPTOMS/MOOD NEUROLOGICAL BOWEL NUTRITION STATUS   (None)  (None) Incontinent (Gastrostomy) Feeding tube (PEG)  AMBULATORY STATUS COMMUNICATION OF NEEDS Skin   Extensive Assist Non-Verbally Skin abrasions, PU Stage and Appropriate Care, Surgical wounds, Other (Comment) (Blister, catheter entry/exit, cracking, Eczema. Non-pressure wound: Posterior left knee (Foam prn).)   PU Stage 2 Dressing:  (Medial buttocks: Foam every 3 days)                   Personal Care Assistance Level of Assistance  Bathing, Dressing Bathing Assistance: Maximum assistance   Dressing Assistance: Maximum assistance     Functional Limitations Info  Sight, Hearing, Speech Sight Info: Adequate Hearing Info: Adequate Speech Info: Impaired (Trach)    SPECIAL CARE FACTORS FREQUENCY  PT (By licensed PT), Blood pressure     PT Frequency: 5 x week  Contractures Contractures Info: Not present    Additional Factors Info  Code Status, Allergies, Psychotropic, Isolation Precautions Code Status Info: Full Allergies Info: Codeine, Lisinopril, Hydrocodone, Hydrocodone-acetaminophen, Tegaderm Ag Mesh (Silver), Tylenol (Acetaminophen) Psychotropic Info: Depression, Anxiety: Fluoxetine (Prozac) 20 MG/5ML solution 60 mg per tube daily,  Valproate Sodium (DEPAKENE) solution 250 mg per tube TID   Isolation Precautions Info: Contact: MRSA     Current Medications (03/27/2017):  This is the current hospital active medication list Current Facility-Administered Medications  Medication Dose Route Frequency Provider Last Rate Last Dose  . 0.9 %  sodium chloride infusion  250 mL Intravenous PRN Penny Pia, MD 0 mL/hr at 03/25/17 2000 250 mL at 03/25/17 2000  . acetaminophen (TYLENOL) solution 650 mg  650 mg Per Tube Q6H PRN Elease Etienne, MD   650 mg at 03/24/17 0902  . aspirin chewable tablet 81 mg  81 mg Per Tube Daily Rolly Salter, MD   81 mg at 03/27/17 2297  . atorvastatin (LIPITOR) tablet 40 mg  40 mg Per Tube QHS Rolly Salter, MD   40 mg at 03/26/17 2229  . chlorhexidine (PERIDEX) 0.12 % solution 15 mL  15 mL Mouth Rinse BID Elease Etienne, MD   15 mL at 03/27/17 0809  . famotidine (PEPCID) tablet 20 mg  20 mg Per Tube BID Elease Etienne, MD   20 mg at 03/27/17 0810  . feeding supplement (OSMOLITE 1.5 CAL) liquid 1,000 mL  1,000 mL Per Tube Continuous Rolly Salter, MD 60 mL/hr at 03/27/17 0640 1,000 mL at 03/27/17 0640  . feeding supplement (PRO-STAT SUGAR FREE 64) liquid 30 mL  30 mL Per Tube BID Rolly Salter, MD   30 mL at 03/27/17 0811  . FLUoxetine (PROZAC) 20 MG/5ML solution 60 mg  60 mg Per Tube Daily Penny Pia, MD   60 mg at 03/27/17 0811  . folic acid (FOLVITE) tablet 1 mg  1 mg Per Tube Daily Elease Etienne, MD   1 mg at 03/27/17 0810  . free water 100 mL  100 mL Per Tube Q4H Hongalgi, Anand D, MD   100 mL at 03/27/17 1100  . insulin aspart (novoLOG) injection 0-15 Units  0-15 Units Subcutaneous Q4H Rolly Salter, MD   2 Units at 03/27/17 [phone]  . MEDLINE mouth rinse  15 mL Mouth Rinse q12n4p Hongalgi, Anand D, MD   15 mL at 03/26/17 1600  . metoCLOPramide (REGLAN) tablet 5 mg  5 mg Per Tube TID AC Rolly Salter, MD   5 mg at 03/27/17 0809  . metoprolol tartrate (LOPRESSOR) tablet 25 mg   25 mg Per Tube BID Elease Etienne, MD   25 mg at 03/27/17 0810  . multivitamin with minerals tablet 1 tablet  1 tablet Per Tube Daily Elease Etienne, MD   1 tablet at 03/27/17 [phone]  . OLANZapine (ZYPREXA) tablet 5 mg  5 mg Per Tube BID Rolly Salter, MD   5 mg at 03/27/17 0810  . oxyCODONE-acetaminophen (PERCOCET/ROXICET) 5-325 MG per tablet 1-2 tablet  1-2 tablet Per Tube Q6H PRN Rolly Salter, MD   2 tablet at 03/26/17 2229  . polyethylene glycol (MIRALAX / GLYCOLAX) packet 17 g  17 g Per Tube Daily Elease Etienne, MD   Stopped at 03/27/17 1000  . senna (SENOKOT) tablet 8.6 mg  1 tablet Per Tube BID Elease Etienne, MD   Stopped at 03/25/17 2006  . sodium  chloride flush (NS) 0.9 % injection 3 mL  3 mL Intravenous Q12H Penny Pia, MD   3 mL at 03/27/17 0821  . sodium chloride flush (NS) 0.9 % injection 3 mL  3 mL Intravenous PRN Penny Pia, MD      . Valproate Sodium (DEPAKENE) solution 250 mg  250 mg Per Tube TID Penny Pia, MD   250 mg at 03/27/17 0810  . vitamin B-12 (CYANOCOBALAMIN) tablet 1,000 mcg  1,000 mcg Per Tube Daily Elease Etienne, MD   1,000 mcg at 03/27/17 0810  . warfarin (COUMADIN) tablet 6 mg  6 mg Per Tube ONCE-1800 Armandina Stammer, RPH      . Warfarin - Pharmacist Dosing Inpatient   Does not apply q1800 Earnie Larsson, [facility]         Discharge Medications: Please see discharge summary for a list of discharge medications.  Relevant Imaging Results:  Relevant Lab Results:   Additional Information SS#: [id number]. Will need humidified air.  Margarito Liner, LCSW

## 2017-03-27 NOTE — Progress Notes (Signed)
Name: Curtis Keller MRN: [id number] DOB: [birth year]    ADMISSION DATE:  03/17/2017 CONSULTATION DATE:  03/24/2017  REFERRING MD :  Dr. Waymon Amato   CHIEF COMPLAINT:  Hypoxia    BRIEF SUMMARY:   61 year old male with PMH of CVA with left hemiparesis/contractures s/p trach and PEG, systolic HF, depression, apical mural thrombus, COPD, GERD, DM2.   Admitted on 6/1 with hypoxia from SNF. Facility reports that patient became hypoxic and a large mucous plug was removed without improvement in saturations. Upon arrival to ED wife reports that patient did not have on TC of 28% while he was sleeping. CXR revealed bilateral lobe infiltrates and antibiotics were administered for HCAP coverage. It was reported that patient has a history of dysphagia requiring PEG placement, however has also been on dysphagia diet with concerns of aspiration Blondell Reveal was started.   Throughout stay patient has been slow to wean Fi02. CXR revealed pulmonary edema and patient was diuresised with Lasix 20 mg IV. PCCM was asked to consult.    SUBJECTIVE:  Remains in positive balance (3.1L), afebrile.  Pt reports LLE pain, relieved with repositioning.  Rising sr cr, lasix held.    VITAL SIGNS: Temp:  [97.8 F (36.6 C)-98.9 F (37.2 C)] 97.8 F (36.6 C) (06/04 0700) Pulse Rate:  [60-81] 76 (06/04 0744) Resp:  [11-22] 16 (06/04 0744) BP: (97-119)/(66-88) 106/77 (06/04 0744) SpO2:  [89 %-95 %] 95 % (06/04 0744) FiO2 (%):  [21 %] 21 % (06/04 0744) Weight:  [205 lb 0.4 oz (93 kg)] 205 lb 0.4 oz (93 kg) (06/04 0445)  PHYSICAL EXAMINATION: General:  Chronically ill appearing male in NAD, lying in bed HEENT: MM pink/moist, #5 distal XLT trach midline c/d/i Neuro: awake, alert, intermittent communication/appropriate but not completely oriented  CV: s1s2 rrr, no m/r/g PULM: even/non-labored, lungs bilaterally occasional rhonchi  WU:JWJX, non-tender, bsx4 active  Extremities: warm/dry, no edema, contractures on L Skin: no  rashes or lesions    Recent Labs Lab 03/25/17 0213 03/26/17 0152 03/27/17 0343  NA 146* 144 147*  K 3.7 3.8 3.7  CL 105 104 105  CO2 33* 36* 34*  BUN 65* 76* 81*  CREATININE 1.68* 1.94* 2.09*  GLUCOSE 112* 105* 116*    Recent Labs Lab 03/22/17 0140 03/23/17 0303 03/26/17 0152  HGB 10.0* 10.5* 10.9*  HCT 32.1* 34.4* 36.2*  WBC 11.3* 12.0* 10.8*  PLT 312 327 363   No results found.   SIGNIFICANT EVENTS  5/25  Presents to ED after large mucous plug removed with no improvement in oxygenation  6/01  PCCM consulted for hypoxic resp fx  STUDIES:  CXR 5/25 > Cardiomegaly with central vascular congestion and mild diffuse edema, streaky atelectasis or PNA at the left base  CXR 5/27 > worsening of pulmonary edema and bibasilar atelectasis left greater then right  CXR 5/30 > Mildly improving bibasilar atelectasis with otherwise similar findings of mild pulmonary edema   ANTIBIOTICS: Cefepime 5/25 > 5/27 Vancomycin 5/26 > 5/27 Unasyn 5/29 > 5/31    ASSESSMENT / PLAN:  Acute on Chronic Hypoxic Respiratory Failure s/p tracheostomy in setting of Aspiration PNA vs HCAP +Pulmonary Edema  P:  Humidified trach collar to prevent mucus plugging > pt on room air.  Would continue at discharge.  Pulmonary hygiene- mobilize, upright positioning  Hold diuresis 6/4, may need low dose lasix (ie 20mg  QD at discharge?) Completed abx Follow up CXR intermittently > repeat 6/4 Trach care per protocol   CKD Stage  3 (Baseline Crt 1.5-2.0) Volume Overload  P: Trend BMP / urinary output Replace electrolytes as indicated Avoid nephrotoxic agents, ensure adequate renal perfusion   Remaining care per primary team.     Canary Brim, NP-C [facility] Pgr: [phone] or if no answer [phone] 03/27/2017, 8:45 AM

## 2017-03-27 NOTE — Progress Notes (Signed)
ANTICOAGULATION CONSULT NOTE  Pharmacy Consult for Warfarin Indication: history of mural apical thrombus  Patient Measurements: Height: 6\' 2"  (188 cm) Weight: 205 lb 0.4 oz (93 kg) IBW/kg (Calculated) : 82.2  Ideal body weight: 82.2 kg (181 lb 3.5 oz) Adjusted ideal body weight: 86.5 kg (190 lb 11.9 oz)  Heparin Wt: 102 kg  Assessment: 61 YOM on Coumadin 6mg  daily for hx apical thrombus. INR therapeutic at 2.68 today. S/p hep bridge 5/26-5/27. Hgb 10.9, plts wnl. No overt s/sx of bleeding noted.   Goal of Therapy:  INR 2-3 Monitor platelets by anticoagulation protocol: Yes   Plan:  Give Coumadin 6mg  per tube tonight Monitor daily INR, CBC, s/s of bleed  Enzo Bi, PharmD, [facility] Clinical Pharmacist Pager [phone] 03/27/2017 8:35 AM

## 2017-03-28 DIAGNOSIS — J9621 Acute and chronic respiratory failure with hypoxia: Secondary | ICD-10-CM

## 2017-03-28 DIAGNOSIS — Z930 Tracheostomy status: Secondary | ICD-10-CM

## 2017-03-28 DIAGNOSIS — F320 Major depressive disorder, single episode, mild: Secondary | ICD-10-CM

## 2017-03-28 DIAGNOSIS — J410 Simple chronic bronchitis: Secondary | ICD-10-CM

## 2017-03-28 DIAGNOSIS — Z9889 Other specified postprocedural states: Secondary | ICD-10-CM

## 2017-03-28 DIAGNOSIS — J9601 Acute respiratory failure with hypoxia: Secondary | ICD-10-CM

## 2017-03-28 LAB — GLUCOSE, CAPILLARY
GLUCOSE-CAPILLARY: 103 mg/dL — AB (ref 65–99)
GLUCOSE-CAPILLARY: 105 mg/dL — AB (ref 65–99)
GLUCOSE-CAPILLARY: 138 mg/dL — AB (ref 65–99)
GLUCOSE-CAPILLARY: 141 mg/dL — AB (ref 65–99)
Glucose-Capillary: 133 mg/dL — ABNORMAL HIGH (ref 65–99)
Glucose-Capillary: 141 mg/dL — ABNORMAL HIGH (ref 65–99)
Glucose-Capillary: 152 mg/dL — ABNORMAL HIGH (ref 65–99)

## 2017-03-28 LAB — BASIC METABOLIC PANEL
ANION GAP: 8 (ref 5–15)
BUN: 82 mg/dL — ABNORMAL HIGH (ref 6–20)
CHLORIDE: 104 mmol/L (ref 101–111)
CO2: 30 mmol/L (ref 22–32)
Calcium: 9.1 mg/dL (ref 8.9–10.3)
Creatinine, Ser: 2.21 mg/dL — ABNORMAL HIGH (ref 0.61–1.24)
GFR, EST AFRICAN AMERICAN: 35 mL/min — AB (ref >60)
GFR, EST NON AFRICAN AMERICAN: 30 mL/min — AB (ref >60)
Glucose, Bld: 132 mg/dL — ABNORMAL HIGH (ref 65–99)
POTASSIUM: 3.5 mmol/L (ref 3.5–5.1)
SODIUM: 142 mmol/L (ref 135–145)

## 2017-03-28 LAB — PROTIME-INR
INR: 2.59
Prothrombin Time: 28.2 seconds — ABNORMAL HIGH (ref 11.4–15.2)

## 2017-03-28 MED ORDER — WARFARIN SODIUM 6 MG PO TABS
6.0000 mg | ORAL_TABLET | Freq: Once | ORAL | Status: AC
  Administered 2017-03-28: 6 mg
  Filled 2017-03-28: qty 1

## 2017-03-28 MED ORDER — FUROSEMIDE 10 MG/ML IJ SOLN
60.0000 mg | Freq: Once | INTRAMUSCULAR | Status: AC
  Administered 2017-03-28: 60 mg via INTRAVENOUS
  Filled 2017-03-28: qty 6

## 2017-03-28 NOTE — Progress Notes (Signed)
Physical Therapy Treatment Patient Details Name: Curtis Keller MRN: [id number] DOB: 02/21/1956 Today's Date: 03/28/2017    History of Present Illness Pt. with PMH of right dependent respiratory failure, requiring tracheostomy, dysphagia requiring PEG tube placement, COPD, CVA, residual weakness with contracture of the left upper and lower extremity, chronic systolic CHF, dementia, recurrent fall, recent hip fracture with surgery, LV apical thrombus; admitted on 03/17/2017, presented with complaint of hypoxia, was found to healthcare associated pneumonia.    PT Comments    Pt demonstrated good participation in today's session with ability to follow commands and use R upper and lower extremity to assist with bed mobility and transfers. Pt continues to require +2 max A during mobility due to L sided hemiparesis and contractures. Pt completed scoot pivot transfer from bed to chair. Pt continues to require trach on 30% FiO2. Pt would benefit from continued skilled PT to improve mobility and decrease contractures on LUE and LLE. Current plan of care remains appropriate.    Follow Up Recommendations  SNF;Supervision/Assistance - 24 hour     Equipment Recommendations  None recommended by PT    Recommendations for Other Services       Precautions / Restrictions Precautions Precautions: Fall Precaution Comments: L hip hemi 02/10/17 from injury 2/18 Restrictions Weight Bearing Restrictions: No    Mobility  Bed Mobility Overal bed mobility: Needs Assistance Bed Mobility: Supine to Sit     Supine to sit: Max assist     General bed mobility comments: Pt able to use RUE to pull up to sit, but still required max A for trunk and LLE managment  Transfers Overall transfer level: Needs assistance   Transfers: Lateral/Scoot Transfers          Lateral/Scoot Transfers: Max assist;+2 physical assistance General transfer comment: Pt able to use R upper and lower extremity to assist with  scooting toward R. Pt required max A +2   Ambulation/Gait                 Stairs            Wheelchair Mobility    Modified Rankin (Stroke Patients Only)       Balance Overall balance assessment: Needs assistance Sitting-balance support: Single extremity supported;Feet supported Sitting balance-Leahy Scale: Poor Sitting balance - Comments: Max A to maintain sitting balance without RUE support.                                     Cognition Arousal/Alertness: Awake/alert Behavior During Therapy: WFL for tasks assessed/performed Overall Cognitive Status: Difficult to assess                                 General Comments: able to follow one step commands. Pt impulsive at times.      Exercises General Exercises - Lower Extremity Ankle Circles/Pumps: PROM;AROM;Right;Left;10 reps (PROM for LLE and AROM for RLE) Heel Slides: AROM;Right;10 reps Straight Leg Raises: AROM;Right;10 reps Hip Flexion/Marching: AROM;Right;5 reps Other Exercises Other Exercises: PROM for L knee ext. pt grimacing and pulling away due to pain.    General Comments General comments (skin integrity, edema, etc.): Completed all mobility with trach and 30% FiO2. Pt with L knee flexion contracture painful to complete PROM at times.       Pertinent Vitals/Pain Pain Assessment: Faces Faces Pain Scale: Hurts even  more Pain Location: L hip  Pain Descriptors / Indicators: Discomfort;Grimacing;Guarding Pain Intervention(s): Limited activity within patient's tolerance;Monitored during session;Repositioned    Home Living                      Prior Function            PT Goals (current goals can now be found in the care plan section) Acute Rehab PT Goals Patient Stated Goal: None stated PT Goal Formulation: With patient Time For Goal Achievement: 04/04/17 Potential to Achieve Goals: Fair Progress towards PT goals: Progressing toward goals     Frequency    Min 2X/week      PT Plan Current plan remains appropriate    Co-evaluation              AM-PAC PT "6 Clicks" Daily Activity  Outcome Measure  Difficulty turning over in bed (including adjusting bedclothes, sheets and blankets)?: Total Difficulty moving from lying on back to sitting on the side of the bed? : Total Difficulty sitting down on and standing up from a chair with arms (e.g., wheelchair, bedside commode, etc,.)?: Total Help needed moving to and from a bed to chair (including a wheelchair)?: Total Help needed walking in hospital room?: Total Help needed climbing 3-5 steps with a railing? : Total 6 Click Score: 6    End of Session Equipment Utilized During Treatment: Gait belt;Oxygen Activity Tolerance: Patient limited by pain Patient left: in chair;with call bell/phone within reach;with chair alarm set Nurse Communication: Mobility status;Other (comment) (Use lateral slide transfer back to bed.) PT Visit Diagnosis: Muscle weakness (generalized) (M62.81)     Time: 5929-2446 PT Time Calculation (min) (ACUTE ONLY): 32 min  Charges:  $Therapeutic Exercise: 8-22 mins $Therapeutic Activity: 8-22 mins                    G Codes:       Corlis Leak, SPT  [phone]   Corlis Leak 03/28/2017, 1:31 PM

## 2017-03-28 NOTE — Progress Notes (Signed)
CSW notified by MD that patient not discharging today. CSW alerted patient's sister and the SNF.  Osborne Casco Shivangi Lutz LCSWA [phone]

## 2017-03-28 NOTE — Progress Notes (Signed)
ANTICOAGULATION CONSULT NOTE  Pharmacy Consult for Warfarin Indication: history of mural apical thrombus  Patient Measurements: Height: 6\' 2"  (188 cm) Weight: 210 lb 1.6 oz (95.3 kg) IBW/kg (Calculated) : 82.2  Ideal body weight: 82.2 kg (181 lb 3.5 oz) Adjusted ideal body weight: 87.4 kg (192 lb 12.3 oz)  Heparin Wt: 102 kg  Assessment: 61 YOM on Coumadin 6mg  daily for hx apical thrombus. INR therapeutic at 2.59 today. S/p hep bridge 5/26-5/27. Hgb 10.9, plts wnl. No overt s/sx of bleeding noted.   Goal of Therapy:  INR 2-3 Monitor platelets by anticoagulation protocol: Yes   Plan:  Give Coumadin 6mg  per tube tonight Monitor daily INR, CBC, s/s of bleed  [employer], Pharm.D., BCPS Clinical Pharmacist Pager: [phone] Clinical phone for 03/28/2017 from 8:30-4:00 is [phone]. After 4pm, please call Main Rx [phone]) for assistance. 03/28/2017 11:35 AM

## 2017-03-28 NOTE — Clinical Social Work Placement (Signed)
   CLINICAL SOCIAL WORK PLACEMENT  NOTE  Date:  03/28/2017  Patient Details  Name: Curtis Keller MRN: [id number] Date of Birth: 21-May-1956  Clinical Social Work is seeking post-discharge placement for this patient at the Skilled  Nursing Facility level of care (*CSW will initial, date and re-position this form in  chart as items are completed):  Yes   Patient/family provided with [facility] Clinical Social Work Department's list of facilities offering this level of care within the geographic area requested by the patient (or if unable, by the patient's family).  Yes   Patient/family informed of their freedom to choose among providers that offer the needed level of care, that participate in Medicare, Medicaid or managed care program needed by the patient, have an available bed and are willing to accept the patient.  Yes   Patient/family informed of [place]'s ownership interest in [facility] and [facility], as well as of the fact that they are under no obligation to receive care at these facilities.  PASRR submitted to EDS on       PASRR number received on       Existing PASRR number confirmed on 03/27/17     FL2 transmitted to all facilities in geographic area requested by pt/family on 03/27/17     FL2 transmitted to all facilities within larger geographic area on       Patient informed that his/her managed care company has contracts with or will negotiate with certain facilities, including the following:        Yes   Patient/family informed of bed offers received.  Patient chooses bed at [facility]     Physician recommends and patient chooses bed at      Patient to be transferred to [facility] on 03/28/17.  Patient to be transferred to facility by [facility]     Patient family notified on 03/28/17 of transfer.  Name of family member notified:  [name]     PHYSICIAN       Additional Comment:     _______________________________________________ Mearl Latin, LCSWA 03/28/2017, 11:26 AM

## 2017-03-28 NOTE — Progress Notes (Signed)
Name: Curtis Keller MRN: [id number] DOB: 20-Mar-1956    ADMISSION DATE:  03/17/2017 CONSULTATION DATE:  03/24/2017  REFERRING MD :  Dr. Waymon Amato   CHIEF COMPLAINT:  Hypoxia    BRIEF SUMMARY:   61 year old male with PMH of CVA with left hemiparesis/contractures s/p trach and PEG, systolic HF, depression, apical mural thrombus, COPD, GERD, DM2.   Admitted on 6/1 with hypoxia from SNF. Facility reports that patient became hypoxic and a large mucous plug was removed without improvement in saturations. Upon arrival to ED wife reports that patient did not have on TC of 28% while he was sleeping. CXR revealed bilateral lobe infiltrates and antibiotics were administered for HCAP coverage. It was reported that patient has a history of dysphagia requiring PEG placement, however has also been on dysphagia diet with concerns of aspiration Blondell Reveal was started.   Throughout stay patient has been slow to wean Fi02. CXR revealed pulmonary edema and patient was diuresised with Lasix  PCCM was asked to consult.    SUBJECTIVE: Continued positive balance (3.3 L), afebrile.  Supine in bed with hands mitted. Lasix on hold 2/2 rising creatinine  VITAL SIGNS: Temp:  [98.2 F (36.8 C)-98.5 F (36.9 C)] 98.5 F (36.9 C) (06/05 0411) Pulse Rate:  [65-78] 75 (06/05 0835) Resp:  [16-20] 18 (06/05 0835) BP: (104-112)/(68-75) 104/68 (06/05 0411) SpO2:  [92 %-100 %] 97 % (06/05 0835) FiO2 (%):  [21 %-30 %] 30 % (06/05 0835) Weight:  [210 lb (95.3 kg)-210 lb 1.6 oz (95.3 kg)] 210 lb 1.6 oz (95.3 kg) (06/05 0358)  PHYSICAL EXAMINATION: General:  Chronically ill appearing male in NAD, lying in bed, trach collar not near trach, hands mitted HEENT: MM pink/moist, #5 distal XLT trach midline and intact, c/d/i, light green thick secretions Neuro: awake, alert, oriented to self and ? Place, MAE x 4, hands mitted CV: RRR, S1, S2, noo RMG PULM: even/non-labored, lungs bilaterally occasional rhonchi , thick yellow  secretions DX:AJOI, non-tender,non-distended bsx4 active  Extremities: warm/dry, no edema, chronic appearing contractures on L Skin: no rashes or lesions, intact without tattoos or piercings    Recent Labs Lab 03/26/17 0152 03/27/17 0343 03/28/17 0603  NA 144 147* 142  K 3.8 3.7 3.5  CL 104 105 104  CO2 36* 34* 30  BUN 76* 81* 82*  CREATININE 1.94* 2.09* 2.21*  GLUCOSE 105* 116* 132*    Recent Labs Lab 03/22/17 0140 03/23/17 0303 03/26/17 0152  HGB 10.0* 10.5* 10.9*  HCT 32.1* 34.4* 36.2*  WBC 11.3* 12.0* 10.8*  PLT 312 327 363   Dg Chest Port 1 View  Result Date: 03/27/2017 CLINICAL DATA:  Shortness of Breath EXAM: PORTABLE CHEST 1 VIEW COMPARISON:  March 24, 2017 FINDINGS: Tracheostomy catheter tip is 4.5 cm above the carina. No pneumothorax. There is patchy airspace consolidation in the left base. There is mild bibasilar atelectasis as well. Heart is borderline enlarged with pulmonary vascularity within normal limits. No adenopathy. No bone lesions. IMPRESSION: Tracheostomy as described without pneumothorax. Patchy airspace opacity left base, suspect a degree of pneumonia. Patchy atelectasis in the bases as well. Stable cardiac silhouette. Electronically Signed   By: Bretta Bang III M.D.   On: 03/27/2017 09:32     SIGNIFICANT EVENTS  5/25  Presents to ED after large mucous plug removed with no improvement in oxygenation  6/01  PCCM consulted for hypoxic resp fx  STUDIES:  CXR 5/25 > Cardiomegaly with central vascular congestion and mild diffuse edema, streaky  atelectasis or PNA at the left base  CXR 5/27 > worsening of pulmonary edema and bibasilar atelectasis left greater then right  CXR 5/30 > Mildly improving bibasilar atelectasis with otherwise similar findings of mild pulmonary edema  CXR 6/4>> Trach w/o pneumothorax,patchy opacity L base, suspect a degree of pneumonia, suspect atelectasis also. Stable cardiac silhouette  ANTIBIOTICS: Cefepime 5/25 >  5/27 Vancomycin 5/26 > 5/27 Unasyn 5/29 > 5/31    ASSESSMENT / PLAN:  Acute on Chronic Hypoxic Respiratory Failure s/p tracheostomy in setting of Aspiration PNA vs HCAP +Pulmonary Edema  P:  Humidified trach collar to prevent mucus plugging > pt on room air.  Would continue at discharge.  Aggressive Pulmonary hygiene- mobilize, upright positioning in chair or bed in high position  Hold diuresis 6/5,as creatinine continues to climb, may need low dose lasix (ie 20mg  QD at discharge?) Completed abx Follow up CXR intermittently > repeat 6/6 Follow up BMET to follow WBC/ fever Trach care per protocol  Continue to monitor secretions, if continued thick consider mucolytics  CKD Stage 3 (Baseline Crt 1.5-2.0) Volume Overload  P: Trend BMP / urinary output Replace electrolytes as indicated Avoid nephrotoxic agents, ensure adequate renal perfusion   All Remaining care per primary team.     Bevelyn Ngo, AGACNP-BC [facility] Pulmonary/Critical Care Medicine Pgr: [phone] 03/28/2017, 9:49 AM

## 2017-03-28 NOTE — Progress Notes (Signed)
PROGRESS NOTE   Curtis Keller  RXV:[id number]    DOB: [birth year]    DOA: 03/17/2017  PCP: Kirt Boys, DO   I have briefly reviewed patients previous medical records in [facility].  Brief Narrative:  61 year old male with PMH of VDRF, tracheostomy dependent, on oxygen via trach collar FiO2 28% PTA, dysphagia s/p PEG tube feeding, COPD, CVA with residual left hemiparesis/contractures, chronic systolic CHF, dementia, recurrent falls, recent hip fracture surgery, LV apical thrombus, presented with hypoxia and noted to have suspected aspiration pneumonia. Slowly improving.Despite aggressive treatment, had high oxygen demands hence Pulmonology was consulted and increased Lasix for suspected decompensated CHF. Respiratory status is improved. Mild AKI, lasix held. briefly hydrating. Possible discharge to SNF 6/5 pending improvement in renal functions.   Assessment & Plan:   Active Problems:   Heart failure, chronic systolic (HCC)   Depression   Apical mural thrombus   Protein calorie malnutrition (HCC)   PEG (percutaneous endoscopic gastrostomy) status (HCC)   COPD (chronic obstructive pulmonary disease) (HCC)   GERD (gastroesophageal reflux disease)   Diabetes mellitus, type II, insulin dependent (HCC)   Pneumonia   HCAP (healthcare-associated pneumonia)   Pressure injury of skin   SOB (shortness of breath)   Hypoxia   1. Aspiration pneumonia: Likely related to dysphagia from prior CVA and apparently was fed by mouth at SNF. Initially started on IV vancomycin and cefepime which was changed to IV Unasyn. Strict nothing by mouth and aspiration precautions. Aggressive pulmonary toilet by RT. Tracheal aspirate showed moderate Candida. Blood cultures 2: Negative. Completed 8 days of IV antibiotics on 03/24/17. Continue pulmonary toilet and strict aspiration precautions. Personally reviewed repeat chest x-ray 6/4: Likely bibasal atelectasis rather than pneumonia in the clinical  context. 2. Acute on chronic hypoxic respiratory failure/s/p tracheostomy from recent VDRF: Apparently was on trach collar with FiO2 of 24-28% prior to admission. As per family, recently discharged from LTAC/select and was only at [facility] for a few hours prior to deterioration and hospitalization again. Acute respiratory failure secondary to aspiration pneumonia and decompensated CHF. Despite aggressive treatment, had ongoing high oxygen requirements and pulmonology was consulted. Treated for decompensated CHF with aggressive IV Lasix diuresis. Hypoxia resolved. Lasix had to be discontinued after 6/2  doses d/t increasing creatinine. Ejection fraction 25-30%. Currently requiring 30% FiO2. Reinitiate Lasix and see the creatinine/urine output improves. Pulmonology follow-up appreciated. Discussed with PCCM >recommend humidified trach collar at discharge to prevent mucous plugging and patient is on room air. 3. Acute on chronic systolic CHF: Not on diuretics PTA. Although clinically does not appear volume overloaded, chest x-ray seemed to suggest pulmonary edema which is slowly improving. Lasix increased by pulmonology to 60 mg 2 times daily. Improved. Lasix discontinued after 6/3 doses due to acute kidney injury. Restart low-dose Lasix 60mg  iv and see if the patient's urine output   and creatinine improves.Marland Kitchen No ACEI/ARB due to renal insufficiency 4. LV apical mural thrombus: INR was initially subtherapeutic and required heparin bridge. INR became supratherapeutic and being managed by pharmacy with reduced dosing. INR therapeutic. 5. Acute on Stage III chronic kidney disease: Creatinine was stable in the 1.7-1.8 range. Creatinine jumped up from 1.68>1.94>2.09>2.21.Lasix discontinued after 6/3 doses due to worsening creatinine. In brief IV D5 infusion and free water through PEG tube. Follow BMP in a.m. discharge pending improvement in renal function to treat chronic CHF. Suspect cardiorenal syndrome  6. Mood  disorder/dementia: Continue Depakote, Zyprexa and fluoxetine. Mental status probably at baseline. 7. RUE swelling:  Venous Doppler 5/29: No DVT or SVT. 8. Buttock lesion: As per wound care consultation 5/30, generalized erythema, left buttock with partial-thickness linear ablation of unknown etiology and the appearance is not consistent with a pressure injury. Continue foam dressing as per their recommendations. 9. Hypokalemia: Replaced. 10. Anemia, chronic: Macrocytic. May be of chronic disease. Stable. 11. CVA with received dual left hemiparesis: Continue aspirin, statins, warfarin anticoagulation. 12. Hyperglycemia: Without diagnosis of DM. On SSI. A1c 5.3.. Reasonable inpatient control. 13. NSVT: Replace potassium (4 and above) and magnesium (2 and above). Increase metoprolol to 37.5 MG twice a day. No ACEI/ARB due to renal insufficiency.    DVT prophylaxis: Anticoagulated on warfarin Code Status: Full Family Communication: None at bedside today.  Disposition: SNF when renal function improves   Consultants:  Pulmonology  Procedures:  Has tracheostomy and PEG tube since prior to admission.  Antimicrobials:  IV Unasyn -discontinued   Subjective: Appears to be worn out , answers in yes and nos, nods no to sob, 30 % FIO2  Objective:  Vitals:   03/28/17 0315 03/28/17 0358 03/28/17 0411 03/28/17 0835  BP:   104/68   Pulse: 67  69 75  Resp: Temp:   98.5 F (36.9 C)   TempSrc:   Oral   SpO2: 93%  95% 97%  Weight:  95.3 kg (210 lb 1.6 oz)    Height:        Examination:  General exam: Pleasant middle-aged male, chronically ill-looking lying comfortably propped up in bed. Looks better than he did mid last week. Respiratory system: Reduced breath sounds in the bases but no wheezing, rhonchi or crackles. Tracheostomy/tract collar and no one humidified room air. Cardiovascular system: S1 & S2 heard, RRR. No JVD, murmurs, rubs, gallops or clicks. No pedal edema.  Telemetry: Sinus rhythm with frequent PVCs. Brief NSVT noted at 1:05 PM on 6/3.  Gastrointestinal system: Abdomen is nondistended, soft and nontender. No organomegaly or masses felt. Normal bowel sounds heard. Central nervous system: Alert and oriented to self. Follows simple instructions. No focal neurological deficits. Extremities: Right limbs grade 5 x 5 power. Left limbs at least grade 2 x 5 power but has contractures. Mildly tender in distal left thigh without any other acute findings. Small linear cut in the left popliteal foci without acute findings of infection. Skin: Exam as per wound care RN on 5/30 Psychiatry: Judgement and insight impaired. Mood & affect appropriate.     Data Reviewed: I have personally reviewed following labs and imaging studies  CBC:  Recent Labs Lab 03/22/17 0140 03/23/17 0303 03/26/17 0152  WBC 11.3* 12.0* 10.8*  HGB 10.0* 10.5* 10.9*  HCT 32.1* 34.4* 36.2*  MCV 104.6* 103.3* 104.6*  PLT 312 327 363   Basic Metabolic Panel:  Recent Labs Lab 03/24/17 1832 03/25/17 0213 03/26/17 0152 03/27/17 0343 03/28/17 0603  NA 144 146* 144 147* 142  K 4.0 3.7 3.8 3.7 3.5  CL 104 105 104 105 104  CO2 31 33* 36* 34* 30  GLUCOSE 185* 112* 105* 116* 132*  BUN 62* 65* 76* 81* 82*  CREATININE 1.76* 1.68* 1.94* 2.09* 2.21*  CALCIUM 9.6 9.6 9.9 9.7 9.1  MG  --  2.5*  --   --   --    Coagulation Profile:  Recent Labs Lab 03/24/17 0224 03/25/17 0213 03/26/17 0152 03/27/17 0343 03/28/17 0603  INR 2.81 3.19 2.89 2.68 2.59   CBG:  Recent Labs Lab 03/27/17 2003 03/28/17 0007 03/28/17  0356 03/28/17 0734 03/28/17 1008  GLUCAP 124* 103* 105* 133* 141*    Recent Results (from the past 240 hour(s))  Culture, respiratory (NON-Expectorated)     Status: None   Collection Time: 03/19/17 11:55 AM  Result Value Ref Range Status   Specimen Description TRACHEAL ASPIRATE  Final   Special Requests NONE  Final   Gram Stain   Final    FEW WBC PRESENT,BOTH  PMN AND MONONUCLEAR MODERATE YEAST    Culture MODERATE CANDIDA GLABRATA  Final   Report Status 03/21/2017 FINAL  Final         Radiology Studies: Dg Chest Port 1 View  Result Date: 03/27/2017 CLINICAL DATA:  Shortness of Breath EXAM: PORTABLE CHEST 1 VIEW COMPARISON:  March 24, 2017 FINDINGS: Tracheostomy catheter tip is 4.5 cm above the carina. No pneumothorax. There is patchy airspace consolidation in the left base. There is mild bibasilar atelectasis as well. Heart is borderline enlarged with pulmonary vascularity within normal limits. No adenopathy. No bone lesions. IMPRESSION: Tracheostomy as described without pneumothorax. Patchy airspace opacity left base, suspect a degree of pneumonia. Patchy atelectasis in the bases as well. Stable cardiac silhouette. Electronically Signed   By: Bretta Bang III M.D.   On: 03/27/2017 09:32        Scheduled Meds: . aspirin  81 mg Per Tube Daily  . atorvastatin  40 mg Per Tube QHS  . chlorhexidine  15 mL Mouth Rinse BID  . famotidine  20 mg Per Tube BID  . feeding supplement (PRO-STAT SUGAR FREE 64)  30 mL Per Tube BID  . FLUoxetine  60 mg Per Tube Daily  . folic acid  1 mg Per Tube Daily  . free water  100 mL Per Tube Q4H  . insulin aspart  0-15 Units Subcutaneous Q4H  . mouth rinse  15 mL Mouth Rinse q12n4p  . metoprolol tartrate  37.5 mg Per Tube BID  . multivitamin with minerals  1 tablet Per Tube Daily  . OLANZapine  5 mg Per Tube BID  . polyethylene glycol  17 g Per Tube Daily  . senna  1 tablet Per Tube BID  . sodium chloride flush  3 mL Intravenous Q12H  . Valproate Sodium  250 mg Per Tube TID  . cyanocobalamin  1,000 mcg Per Tube Daily  . Warfarin - Pharmacist Dosing Inpatient   Does not apply q1800   Continuous Infusions: . sodium chloride 250 mL (03/25/17 2000)  . feeding supplement (OSMOLITE 1.5 CAL) 1,000 mL (03/28/17 0358)     LOS: 10 days     Richarda Overlie, MD,  [facility] Pager [phone]  If  7PM-7AM, please contact night-coverage [email or web address] Password [email or web address] 03/28/2017, 11:27 AM

## 2017-03-29 ENCOUNTER — Inpatient Hospital Stay (HOSPITAL_COMMUNITY): Payer: Medicare Other

## 2017-03-29 DIAGNOSIS — Z794 Long term (current) use of insulin: Secondary | ICD-10-CM

## 2017-03-29 DIAGNOSIS — E119 Type 2 diabetes mellitus without complications: Secondary | ICD-10-CM

## 2017-03-29 LAB — COMPREHENSIVE METABOLIC PANEL
ALT: 23 U/L (ref 17–63)
ANION GAP: 9 (ref 5–15)
AST: 34 U/L (ref 15–41)
Albumin: 2.2 g/dL — ABNORMAL LOW (ref 3.5–5.0)
Alkaline Phosphatase: 95 U/L (ref 38–126)
BUN: 82 mg/dL — ABNORMAL HIGH (ref 6–20)
CHLORIDE: 104 mmol/L (ref 101–111)
CO2: 32 mmol/L (ref 22–32)
Calcium: 9.4 mg/dL (ref 8.9–10.3)
Creatinine, Ser: 2.26 mg/dL — ABNORMAL HIGH (ref 0.61–1.24)
GFR calc non Af Amer: 29 mL/min — ABNORMAL LOW (ref >60)
GFR, EST AFRICAN AMERICAN: 34 mL/min — AB (ref >60)
Glucose, Bld: 160 mg/dL — ABNORMAL HIGH (ref 65–99)
POTASSIUM: 3.7 mmol/L (ref 3.5–5.1)
Sodium: 145 mmol/L (ref 135–145)
TOTAL PROTEIN: 7.6 g/dL (ref 6.5–8.1)
Total Bilirubin: 0.4 mg/dL (ref 0.3–1.2)

## 2017-03-29 LAB — CBC
HCT: 33.9 % — ABNORMAL LOW (ref 39.0–52.0)
Hemoglobin: 10.4 g/dL — ABNORMAL LOW (ref 13.0–17.0)
MCH: 31.6 pg (ref 26.0–34.0)
MCHC: 30.7 g/dL (ref 30.0–36.0)
MCV: 103 fL — ABNORMAL HIGH (ref 78.0–100.0)
Platelets: 319 10*3/uL (ref 150–400)
RBC: 3.29 MIL/uL — ABNORMAL LOW (ref 4.22–5.81)
RDW: 15.5 % (ref 11.5–15.5)
WBC: 11.1 10*3/uL — ABNORMAL HIGH (ref 4.0–10.5)

## 2017-03-29 LAB — GLUCOSE, CAPILLARY
GLUCOSE-CAPILLARY: 111 mg/dL — AB (ref 65–99)
GLUCOSE-CAPILLARY: 144 mg/dL — AB (ref 65–99)
GLUCOSE-CAPILLARY: 133 mg/dL — AB (ref 65–99)
Glucose-Capillary: 122 mg/dL — ABNORMAL HIGH (ref 65–99)
Glucose-Capillary: 128 mg/dL — ABNORMAL HIGH (ref 65–99)
Glucose-Capillary: 147 mg/dL — ABNORMAL HIGH (ref 65–99)

## 2017-03-29 LAB — BLOOD GAS, ARTERIAL
ACID-BASE EXCESS: 8.7 mmol/L — AB (ref 0.0–2.0)
Bicarbonate: 32.5 mmol/L — ABNORMAL HIGH (ref 20.0–28.0)
DRAWN BY: 33147
FIO2: 30
O2 SAT: 91.5 %
PH ART: 7.485 — AB (ref 7.350–7.450)
Patient temperature: 98.6
pCO2 arterial: 43.6 mmHg (ref 32.0–48.0)
pO2, Arterial: 62.1 mmHg — ABNORMAL LOW (ref 83.0–108.0)

## 2017-03-29 LAB — PROTIME-INR
INR: 2.48
PROTHROMBIN TIME: 27.3 s — AB (ref 11.4–15.2)

## 2017-03-29 MED ORDER — FREE WATER
100.0000 mL | Status: DC
  Administered 2017-03-29 – 2017-03-30 (×9): 100 mL

## 2017-03-29 MED ORDER — FAMOTIDINE 20 MG PO TABS
20.0000 mg | ORAL_TABLET | Freq: Every day | ORAL | Status: DC
  Administered 2017-03-29 – 2017-03-30 (×2): 20 mg
  Filled 2017-03-29 (×2): qty 1

## 2017-03-29 MED ORDER — WARFARIN SODIUM 6 MG PO TABS: 6.0000 mg | ORAL_TABLET | Freq: Every day | ORAL | Status: DC

## 2017-03-29 MED ORDER — WARFARIN SODIUM 6 MG PO TABS
6.0000 mg | ORAL_TABLET | Freq: Every day | ORAL | Status: DC
  Administered 2017-03-29 – 2017-03-30 (×2): 6 mg via ORAL
  Filled 2017-03-29 (×2): qty 1

## 2017-03-29 NOTE — Progress Notes (Addendum)
PROGRESS NOTE   Curtis Keller  ZOX:[id number]    DOB: [birth year]    DOA: 03/17/2017  PCP: Kirt Boys, DO   I have briefly reviewed patients previous medical records in [facility].  Brief Narrative:  61 year old male with PMH of VDRF, tracheostomy dependent, on oxygen via trach collar FiO2 28% PTA, dysphagia s/p PEG tube feeding, COPD, CVA with residual left hemiparesis/contractures, chronic systolic CHF, dementia, recurrent falls, recent hip fracture surgery, LV apical thrombus, presented with hypoxia and noted to have suspected aspiration pneumonia. Slowly improving.Despite aggressive treatment, had high oxygen demands hence Pulmonology was consulted and increased Lasix for suspected decompensated CHF. Respiratory status is improved. Mild AKI, lasix held. briefly hydrating. Possible discharge to SNF 6/7 pending improvement in renal functions.   Assessment & Plan:   Active Problems:   Heart failure, chronic systolic (HCC)   Depression   Apical mural thrombus   Protein calorie malnutrition (HCC)   PEG (percutaneous endoscopic gastrostomy) status (HCC)   COPD (chronic obstructive pulmonary disease) (HCC)   GERD (gastroesophageal reflux disease)   Diabetes mellitus, type II, insulin dependent (HCC)   Pneumonia   HCAP (healthcare-associated pneumonia)   Pressure injury of skin   SOB (shortness of breath)   Hypoxia   Acute on chronic respiratory failure with hypoxia (HCC)   Tracheostomy status (HCC)   1. Aspiration pneumonia: Likely related to dysphagia from prior CVA and apparently was fed by mouth at SNF. Initially started on IV vancomycin and cefepime which was changed to IV Unasyn. Strict nothing by mouth and aspiration precautions. Aggressive pulmonary toilet by RT. Tracheal aspirate showed moderate Candida. Blood cultures 2: Negative. Completed 8 days of IV antibiotics on 03/24/17. Continue pulmonary toilet and strict aspiration precautions. Personally reviewed repeat  chest x-ray 6/4: CT chest 6/6  to rule out pneumonia vs CHF, shows no acute lung pathology  2. Acute on chronic hypoxic respiratory failure/s/p tracheostomy from recent VDRF: Apparently was on trach collar with FiO2 of 24-28% prior to admission. As per family, recently discharged from LTAC/[facility] and was only at [facility] for a few hours prior to deterioration and hospitalization again. Acute respiratory failure secondary to aspiration pneumonia and decompensated CHF. Despite aggressive treatment, had ongoing high oxygen requirements and pulmonology was consulted. Treated for decompensated CHF with aggressive IV Lasix diuresis. Hypoxia resolved. Lasix had to be discontinued after 6/2  doses d/t increasing creatinine. Ejection fraction 25-30%. Currently requiring 30% FiO2.    Pulmonary recommends . Repeat chest x-ray in 4-6 weeks to ensure resolution of left lower lobe pneumonia. Critical care Has signed off .   3. Acute on chronic systolic CHF:  . Although clinically does not appear volume overloaded,Iinitial chest x-ray seemed to suggest pulmonary edema which is slowly improving. Lasix initially increased by pulmonology to 60 mg 2 times daily.  Lasix discontinued after 6/3 doses due to acute kidney injury.  Marland Kitchen No ACEI/ARB due to renal insufficiency. Cardiology consulted to rule out cardiorenal syndrome, due to weight reduction from 225>209 lbs , lasix remains on hold  4. LV apical mural thrombus: INR was initially subtherapeutic and required heparin bridge. INR became supratherapeutic and being managed by pharmacy with reduced dosing. INR  5. Acute on Stage III chronic kidney disease: Creatinine was stable in the 1.7-1.8 range. Creatinine jumped up from 1.68>1.94>2.09>2.21>2.26.Lasix discontinued after 6/3 doses due to worsening creatinine. Continue free water through PEG tube. Follow BMP in a.m. discharge pending improvement in renal function to treat chronic CHF. Suspect cardiorenal syndrome  6. Mood  disorder/dementia: Continue Depakote, Zyprexa and fluoxetine. Mental status probably at baseline. 7. RUE swelling: Venous Doppler 5/29: No DVT or SVT. 8. Buttock lesion: As per wound care consultation 5/30, generalized erythema, left buttock with partial-thickness linear ablation of unknown etiology and the appearance is not consistent with a pressure injury. Continue foam dressing as per their recommendations. 9. Hypokalemia: Replaced. 10. Anemia, chronic: Macrocytic. May be of chronic disease. Stable. 11. CVA with received dual left hemiparesis: Continue aspirin, statins, warfarin anticoagulation. 12. Hyperglycemia: Without diagnosis of DM. On SSI. A1c 5.3.. Reasonable inpatient control. 13. NSVT: Replace potassium (4 and above) and magnesium (2 and above).  metoprolol to 37.5 MG twice a day. No ACEI/ARB due to renal insufficiency.    DVT prophylaxis: Anticoagulated on warfarin Code Status: Full Family Communication: discussed extensively with sister by the bedside she is agreeable with the plan ition: SNF when renal function improves   Consultants:  Pulmonology  Procedures:  Has tracheostomy and PEG tube since prior to admission.  Antimicrobials:  IV Unasyn -discontinued   Subjective Patient sleepy and lethargic , but arousable on command   Vitals:   03/29/17 0006 03/29/17 0416 03/29/17 0857 03/29/17 1208  BP: 119/71 118/86    Pulse: 65 80 82 81  Resp:  18 18 16   Temp:  98.4 F (36.9 C)    TempSrc:  Oral    SpO2:  98% 94% 98%  Weight:  94.8 kg (209 lb)    Height:        Examination:  General exam: Pleasant middle-aged male, chronically ill-looking lying comfortably propped up in bed.   Respiratory system: Reduced breath sounds in the bases but no wheezing, rhonchi or crackles. Tracheostomy/tract collar and no one humidified room air. Cardiovascular system: S1 & S2 heard, RRR. No JVD, murmurs, rubs, gallops or clicks. No pedal edema. Telemetry: Sinus rhythm with  frequent PVCs. Brief NSVT noted at 1:05 PM on 6/3.  Gastrointestinal system: Abdomen is nondistended, soft and nontender. No organomegaly or masses felt. Normal bowel sounds heard. Central nervous system: Patient is sleepy. Follows simple instructions. No focal neurological deficits. Extremities: Right limbs grade 5 x 5 power. Left limbs at least grade 2 x 5 power but has contractures. Mildly tender in distal left thigh without any other acute findings. Small linear cut in the left popliteal foci without acute findings of infection. Skin: Exam as per wound care RN on 5/30 Psychiatry: Judgement and insight impaired. Mood & affect appropriate.     Data Reviewed: I have personally reviewed following labs and imaging studies  CBC:  Recent Labs Lab 03/23/17 0303 03/26/17 0152 03/29/17 0523  WBC 12.0* 10.8* 11.1*  HGB 10.5* 10.9* 10.4*  HCT 34.4* 36.2* 33.9*  MCV 103.3* 104.6* 103.0*  PLT 327 363 319   Basic Metabolic Panel:  Recent Labs Lab 03/25/17 0213 03/26/17 0152 03/27/17 0343 03/28/17 0603 03/29/17 0523  NA 146* 144 147* 142 145  K 3.7 3.8 3.7 3.5 3.7  CL 105 104 105 104 104  CO2 33* 36* 34* 30 32  GLUCOSE 112* 105* 116* 132* 160*  BUN 65* 76* 81* 82* 82*  CREATININE 1.68* 1.94* 2.09* 2.21* 2.26*  CALCIUM 9.6 9.9 9.7 9.1 9.4  MG 2.5*  --   --   --   --    Coagulation Profile:  Recent Labs Lab 03/25/17 0213 03/26/17 0152 03/27/17 0343 03/28/17 0603 03/29/17 0523  INR 3.19 2.89 2.68 2.59 2.48   CBG:  Recent Labs Lab  03/28/17 1956 03/29/17 0004 03/29/17 0411 03/29/17 0828 03/29/17 1220  GLUCAP 138* 122* 147* 111* 144*    No results found for this or any previous visit (from the past 240 hour(s)).       Radiology Studies: No results found.      Scheduled Meds: . aspirin  81 mg Per Tube Daily  . atorvastatin  40 mg Per Tube QHS  . chlorhexidine  15 mL Mouth Rinse BID  . famotidine  20 mg Per Tube Daily  . feeding supplement (PRO-STAT  SUGAR FREE 64)  30 mL Per Tube BID  . FLUoxetine  60 mg Per Tube Daily  . folic acid  1 mg Per Tube Daily  . free water  100 mL Per Tube Q4H  . insulin aspart  0-15 Units Subcutaneous Q4H  . mouth rinse  15 mL Mouth Rinse q12n4p  . metoprolol tartrate  37.5 mg Per Tube BID  . multivitamin with minerals  1 tablet Per Tube Daily  . OLANZapine  5 mg Per Tube BID  . polyethylene glycol  17 g Per Tube Daily  . senna  1 tablet Per Tube BID  . sodium chloride flush  3 mL Intravenous Q12H  . Valproate Sodium  250 mg Per Tube TID  . cyanocobalamin  1,000 mcg Per Tube Daily  . warfarin  6 mg Oral q1800  . Warfarin - Pharmacist Dosing Inpatient   Does not apply q1800   Continuous Infusions: . sodium chloride 250 mL (03/25/17 2000)  . feeding supplement (OSMOLITE 1.5 CAL) 1,000 mL (03/29/17 0155)     LOS: 11 days     Richarda Overlie, MD,  [facility] Pager [phone]  If 7PM-7AM, please contact night-coverage [email or web address] Password [email or web address] 03/29/2017, 12:32 PM

## 2017-03-29 NOTE — Progress Notes (Signed)
Nutrition Follow-up  DOCUMENTATION CODES:   Not applicable  INTERVENTION:   -Continue Continue Osmolite 1.5 at 60 ml/h via PEG with Pro-stat 30 ml BID to provide 2360 kcals, 120 gm protein, 1097 ml free water daily (1697 ml free water with inclusion of flushing regimen)  NUTRITION DIAGNOSIS:   Inadequate oral intake related to inability to eat as evidenced by NPO status.  Ongoing  GOAL:   Patient will meet greater than or equal to 90% of their needs  Met with TF  MONITOR:   TF tolerance, Weight trends, Labs, I & O's  REASON FOR ASSESSMENT:   Malnutrition Screening Tool, Consult Enteral/tube feeding initiation and management  ASSESSMENT:   61 yo male with PMH of stroke, trach, dysphagia, G-tube, CAD, CHF, dementia, HTN, HLD, ischemic cardiomyopathy, who was admitted on 5/25 with PNA.  6/4- transferred from SDU to medical floor   Pt remains NPO, continues to receive Patient is currently receiving Osmolite 1.5 via PEG at 60 ml/h (1440 ml/day) with Prostat 30 ml BID to provide 2360 kcals, 120 gm protein, 1097 ml free water daily (1697 ml free water with inclusion of free water flush regimen).   Pt on trach collar. Noted 100 ml free water flush every 4 hours added on 03/29/17.   Spoke with pt sister at bedside, who reports pt is tolerating TF well. She denies pt complains of nausea, vomiting, or abdominal pain. She shares that pt was on a pureed diet several months ago, but was placed back on TF due to recurrent aspiration. She has multiple questions regarding pt's TF formula, which RD answered.   Per CSW notes, plan to d/c to SNF once medically stable.   Labs reviewed.   Diet Order:   NPO  Skin:   (open wound L knee, Non PI to buttocks, L hip incision)  Last BM:  03/28/17  Height:   Ht Readings from Last 1 Encounters:  03/23/17 6' 2" (1.88 m)    Weight:   Wt Readings from Last 1 Encounters:  03/29/17 209 lb (94.8 kg)    Ideal Body Weight:  86.36 kg  BMI:   Body mass index is 26.83 kg/m.  Estimated Nutritional Needs:   Kcal:  2200-2400  Protein:  120-135 gm  Fluid:  2.2-2.4 L  EDUCATION NEEDS:   No education needs identified at this time  Ruqaya Strauss A. Jimmye Norman, RD, LDN, CDE Pager: [phone] After hours Pager: [phone]

## 2017-03-29 NOTE — Progress Notes (Signed)
ANTICOAGULATION CONSULT NOTE  Pharmacy Consult for Warfarin Indication: history of mural apical thrombus  Patient Measurements: Height: 6\' 2"  (188 cm) Weight: 209 lb (94.8 kg) IBW/kg (Calculated) : 82.2  Ideal body weight: 82.2 kg (181 lb 3.5 oz) Adjusted ideal body weight: 87.2 kg (192 lb 5.3 oz)    Assessment: 61 YOM on Coumadin 6mg  daily for hx apical thrombus. INR therapeutic. Hgb 10.4, plts wnl.  Goal of Therapy:  INR 2-3 Monitor platelets by anticoagulation protocol: Yes   Plan:  1. Continue Coumadin 6mg  per tube every evening  2. Monitor daily INR, CBC, s/s of bleed  Pollyann Samples, PharmD, BCPS 03/29/2017, 9:14 AM Clinical phone for 03/29/2017 from 7:00-3:00 is [phone]. After 3pm, please call Main Rx [phone]) for assistance. 03/29/2017 9:13 AM

## 2017-03-29 NOTE — Consult Note (Signed)
Cardiology Consult    Patient ID: Curtis Keller MRN: [id number], DOB/AGE: Jul 25, 1956   Admit date: 03/17/2017 Date of Consult: 03/29/2017  Primary Physician: Kirt Boys, DO Reason for Consult:  Primary Cardiologist: Dr. Danelle Berry of [facility] Requesting Provider: Dr. Susie Cassette  History of Present Illness    Curtis Keller is a 61 y.o. male who is being seen today for the evaluation of heart failure at the request of Dr Susie Cassette. The patient has a past medical history significant for Tracheostomy dependent, on oxygen via trach collar, dysphagia status post PEG tube feeding, COPD, CVA 11/2015 with residual left hemiparesis/contractures, chronic systolic CHF, dementia, recurrent falls, recent hip fracture surgery, LV apical thrombus who presented with hypoxia and was noted to have suspected aspiration pneumonia the patient has been slowly improving. He is suspected to have decompensated heart failure for which we have been consulted. He has mild AKI and Lasix is being held. The patient currently does not appear difficulty volume overloaded.  Patient is currently being treated for aspiration pneumonia, acute on chronic hypoxic respiratory failure, acute on chronic systolic heart failure, acute on chronic stage III kidney disease, anemia, and NSVT  Patient's sister, who is a Engineer, civil (consulting), is here and provides the bulk of the information along with extensive chart review. She reports that patient had an MI in 1999 with stent placement and subsequent EF of 45%. She reports that he has had long-standing systolic dysfunction but rarely has signs and symptoms of heart failure. She is most concerned about his renal function. She reports that the patient had been started on Eliquis for LV thrombus but then developed a CVA and the patient was switched to warfarin and she was told to not let anyone ever take him off the warfarin. The patient has a daughter but she apparently is not  active in his care.  The patient has been residing in a nursing facility. He has a hip fracture earlier this year and aspiration pneumonia during that hospitalization.   The patient was last seen in a cardiology office on 06/10/15 by Dr. Danelle Berry of [facility] in [facility] where it is noted that he has CAD with ischemic cardiomyopathy, EF approximately 30%, bare-metal stent to RCA in 12/2014. He also has hypercholesterolemia, hypertension well-controlled, half pack per day smoker, history of abdominal aortic aneurysm without rupture, hepatitis C. At that time his medications included Eliquis 5 mg twice a day, atorvastatin 40 mg daily, carvedilol 12.5 mg twice a day, and Plavix 75 mg daily.    His last echocardiogram through [facility] health care on 01/06/16 showed EF estimated to be 25-30% with anterior and septal wall hypokinesis. No obvious large thrombus, but could not rule out mono mural thrombus. Mild mitral regurgitation. Normal right ventricular size and function.  A previous echocardiogram on 12/19/2015 showed a moderate size apical thrombus, moderately dilated left ventricle, EF 25-30 percent, moderate to severe septal hypokinesis and apical akinesis.  The patient's most recent echocardiogram on 11/28/16 shows an EF of 25-30% and no thrombus evident on the contrast images.  Past Medical History   Past Medical History:  Diagnosis Date  . Acute respiratory failure (HCC)    a. Spring 2017 following fall/splenectomy -->admitted to [facility]-->trach/g tube.  . Anemia    a. 12/2015 ABL in setting of fall/hematomas/splenic laceration req splenectomy.  Marland Kitchen Apical mural thrombus    a. 11/2015 Echo: EF 25-30%, moderate size apical thrombus-->coumadin;  b.  12/2015 f/u Echo: EF 25-30%, ant/septa HK, no obvious large thrombus but cannot exclude small mural thrombus.  Marland Kitchen CAD (coronary artery disease)    a. 12/2014 s/p BMS to the RCA.  Marland Kitchen Cerebral infarction  (HCC)   . Chronic systolic CHF (congestive heart failure) (HCC)    a. 12/2015 Echo: EF 25-30%.  . Dementia   . Depression   . Dysphagia   . Falls    a. 11/2015 traumatic fall with resultant trauma req splenectomy and prolonged hospitalization complicated by resp failure.  Marland Kitchen History of pneumonia   . Hyperlipidemia   . Hypertension   . Ischemic cardiomyopathy    a. 12/2015 Echo: EF 25-30%, anterior and septal HK.  Marland Kitchen Left hemiplegia (HCC)   . Protein calorie malnutrition (HCC)   . Respiratory failure (HCC)   . Spleen injury   . Status post tracheostomy (HCC)    a. 01/2016 in setting of ongoing resp failure and aspiration.    Past Surgical History:  Procedure Laterality Date  . CARDIAC CATHETERIZATION    . CORONARY ANGIOPLASTY WITH STENT PLACEMENT    . HIP ARTHROPLASTY Left 02/10/2017   Procedure: LEFT HIP HEMIARTHROPLASTY;  Surgeon: [name], MD;  Location: [facility] OR;  Service: Orthopedics;  Laterality: Left;  . IR GASTROSTOMY TUBE MOD SED  02/02/2017  . IR GENERIC HISTORICAL  07/22/2016   IR GASTROSTOMY TUBE REMOVAL 07/22/2016 [name], MD [facility]-INTERV RAD  . PR LAP, SPLENECTOMY  01/07/2016  . TRACHEOSTOMY TUBE PLACEMENT N/A 02/22/2016   Procedure: TRACHEOSTOMY;  Surgeon: [name], MD;  Location: [facility] OR;  Service: ENT;  Laterality: N/A;  . TRACHEOSTOMY TUBE PLACEMENT N/A 01/14/2017   Procedure: TRACHEOSTOMY;  Surgeon: [name], MD;  Location: [facility] OR;  Service: ENT;  Laterality: N/A;  inserted #[id number] ZOX#[id number]     Allergies  Allergies  Allergen Reactions  . Codeine Anaphylaxis and Nausea And Vomiting  . Lisinopril Itching and Rash  . Hydrocodone Other (See Comments)    On MAR  . Hydrocodone-Acetaminophen Nausea And Vomiting    Cannot take any acetaminophen due to liver issues per sister  . Tegaderm Ag Mesh [Silver] Other (See Comments)    On MAR  . Tylenol [Acetaminophen] Nausea Only and Other (See Comments)    Cannot take any acetaminophen due to liver  issues per sister    Inpatient Medications    . aspirin  81 mg Per Tube Daily  . atorvastatin  40 mg Per Tube QHS  . chlorhexidine  15 mL Mouth Rinse BID  . famotidine  20 mg Per Tube Daily  . feeding supplement (PRO-STAT SUGAR FREE 64)  30 mL Per Tube BID  . FLUoxetine  60 mg Per Tube Daily  . folic acid  1 mg Per Tube Daily  . free water  100 mL Per Tube Q4H  . insulin aspart  0-15 Units Subcutaneous Q4H  . mouth rinse  15 mL Mouth Rinse q12n4p  . metoprolol tartrate  37.5 mg Per Tube BID  . multivitamin with minerals  1 tablet Per Tube Daily  . OLANZapine  5 mg Per Tube BID  . polyethylene glycol  17 g Per Tube Daily  . senna  1 tablet Per Tube BID  . sodium chloride flush  3 mL Intravenous Q12H  . Valproate Sodium  250 mg Per Tube TID  . cyanocobalamin  1,000 mcg Per Tube Daily  . warfarin  6 mg Oral q1800  . Warfarin - Pharmacist Dosing Inpatient  Does not apply q34    Family History    Family History  Problem Relation Age of Onset  . Alcohol abuse Mother   . Hypertension Mother   . Coronary artery disease Mother   . Alcohol abuse Father   . Hypertension Father   . Coronary artery disease Father   . Alcohol abuse Maternal Uncle      Social History    Social History   Social History  . Marital status: Single    Spouse name: N/A  . Number of children: N/A  . Years of education: N/A   Occupational History  . Not on file.   Social History Main Topics  . Smoking status: Former Smoker    Packs/day: 2.00    Years: 38.00    Types: Cigarettes  . Smokeless tobacco: Never Used     Comment: quit spring of 2017.  Marland Kitchen Alcohol use No  . Drug use: No  . Sexual activity: Not on file   Other Topics Concern  . Not on file   Social History Narrative   Lives @ SNF since Spring 2017.  Sedentary.  Unsteady on his feet.  Falls about 1x/wk.     Review of Systems    General:  Patient with no current general complaints Cardiovascular:  No chest pain, dyspnea on  exertion, edema, orthopnea, palpitations, paroxysmal nocturnal dyspnea. Dermatological: No rash, lesions/masses Respiratory: Patient has trach with occasional cough Urologic: No hematuria, dysuria Neurologic:  No visual changes, wkns, changes in mental status. Patient has chronic left sided hemiparesis status post stroke and his sister feels that he is getting weaker as he lies in the bed, his contractures are getting worse All other systems reviewed and are otherwise negative except as noted above.  Physical Exam    Blood pressure 118/86, pulse 82, temperature 98.4 F (36.9 C), temperature source Oral, resp. rate 18, height 6\' 2"  (1.88 m), weight 209 lb (94.8 kg), SpO2 94 %.  General: Pale, chronically ill male patient, NAD Psych: Normal affect. Neuro: Alert and oriented X 3. Left-sided hemiparesis with contractures of the left arm and left leg HEENT: Normal, tracheostomy present Neck: Supple without bruits or JVD. Lungs:  Resp regular and unlabored, CTA except few scattered rhonchi Heart: RRR no s3, s4, or murmurs. Abdomen: Soft, non-tender, non-distended, BS + x 4.  Extremities: No clubbing, cyanosis or edema. DP/PT/Radials 2+ and equal bilaterally.  Labs    Troponin (Point of Care Test) No results for input(s): TROPIPOC in the last 72 hours. No results for input(s): CKTOTAL, CKMB, TROPONINI in the last 72 hours. Lab Results  Component Value Date   WBC 11.1 (H) 03/29/2017   HGB 10.4 (L) 03/29/2017   HCT 33.9 (L) 03/29/2017   MCV 103.0 (H) 03/29/2017   PLT 319 03/29/2017    Recent Labs Lab 03/29/17 0523  NA 145  K 3.7  CL 104  CO2 32  BUN 82*  CREATININE 2.26*  CALCIUM 9.4  PROT 7.6  BILITOT 0.4  ALKPHOS 95  ALT 23  AST 34  GLUCOSE 160*   No results found for: CHOL, HDL, LDLCALC, TRIG No results found for: [facility]   Radiology Studies    Dg Chest 2 View  Result Date: 03/17/2017 CLINICAL DATA:  61 year old with chronic ventilator dependent respiratory failure  presenting with cough. Current history of chronic systolic congestive heart failure, hypertension, cardiomyopathy and COPD. EXAM: CHEST  2 VIEW 3:48 p.m.: COMPARISON:  Portable chest x-ray earlier today 1:43 p.m., 03/06/2017  and earlier. FINDINGS: AP erect and lateral images were obtained. Tracheostomy tube tip in satisfactory position below the thoracic inlet projecting approximately 5 cm above carina. Patchy airspace opacities in the lung bases, left greater than right, similar to the examination earlier today, new since 03/06/2017. Slight improvement in the interstitial pulmonary edema since earlier today. No new pulmonary parenchymal abnormalities since earlier today. IMPRESSION: 1. Tracheostomy tube tip in satisfactory position approximately 5 cm above carina. 2. Pneumonia involving the lung bases, left greater than right. 3. Improving interstitial pulmonary edema. Electronically Signed   By: Hulan Saas M.D.   On: 03/17/2017 16:14   Dg Chest Port 1 View  Result Date: 03/27/2017 CLINICAL DATA:  Shortness of Breath EXAM: PORTABLE CHEST 1 VIEW COMPARISON:  March 24, 2017 FINDINGS: Tracheostomy catheter tip is 4.5 cm above the carina. No pneumothorax. There is patchy airspace consolidation in the left base. There is mild bibasilar atelectasis as well. Heart is borderline enlarged with pulmonary vascularity within normal limits. No adenopathy. No bone lesions. IMPRESSION: Tracheostomy as described without pneumothorax. Patchy airspace opacity left base, suspect a degree of pneumonia. Patchy atelectasis in the bases as well. Stable cardiac silhouette. Electronically Signed   By: Bretta Bang III M.D.   On: 03/27/2017 09:32   Dg Chest Port 1 View  Result Date: 03/24/2017 CLINICAL DATA:  Hypoxia EXAM: PORTABLE CHEST 1 VIEW COMPARISON:  Two days ago FINDINGS: Tracheostomy tube in place. Cardiomegaly and diffuse interstitial opacity. Low lung volumes. Small layering pleural effusions are not excluded. No  evidence of pneumothorax. IMPRESSION: 1. Cardiomegaly and mild pulmonary edema. 2. Low volume chest with atelectasis. 3. No change from study 2 days prior. Electronically Signed   By: Marnee Spring M.D.   On: 03/24/2017 14:47   Dg Chest Port 1 View  Result Date: 03/22/2017 CLINICAL DATA:  Hypoxia EXAM: PORTABLE CHEST 1 VIEW COMPARISON:  03/19/2017; 03/17/2017; 03/06/2017 FINDINGS: Grossly unchanged enlarged cardiac silhouette and mediastinal contours given AP projection. Stable position of support apparatus. Pulmonary vasculature remains indistinct with cephalization of flow. Minimally improved aeration of lungs with persistent bibasilar opacities, left greater than right. No new focal airspace opacities. No definite pleural effusion or pneumothorax. No acute osseus abnormalities. IMPRESSION: 1.  Stable positioning of support apparatus.  No pneumothorax. 2. Minimally improved bibasilar atelectasis with otherwise similar findings of mild pulmonary edema. Electronically Signed   By: [name] M.D.   On: 03/22/2017 09:15   Dg Chest Port 1 View  Result Date: 03/19/2017 CLINICAL DATA:  Shortness of breath EXAM: PORTABLE CHEST 1 VIEW COMPARISON:  03/17/2017; 03/06/2017; 01/27/2017 FINDINGS: Unchanged enlarged cardiac silhouette and mediastinal contours. Stable position of support apparatus. Pulmonary vasculature appears less distinct with cephalization of flow. Interval development of trace right and small left-sided effusion with associated worsening bibasilar opacities, left greater than right. No pneumothorax. No acute osseus abnormalities. Old right anterior rib fractures. IMPRESSION: 1.  No acute cardiopulmonary disease. 2. Suspected worsening pulmonary edema and bibasilar atelectasis, left greater than right. Electronically Signed   By: [name] M.D.   On: 03/19/2017 13:40   Dg Chest Port 1 View  Result Date: 03/17/2017 CLINICAL DATA:  Shortness of breath EXAM: PORTABLE CHEST 1 VIEW COMPARISON:   03/06/2017, 02/20/2017, 02/08/2017 FINDINGS: Tracheostomy tube tip about 2.4 cm superior to the carina and projects over the tracheal air column. Cardiomegaly with central vascular congestion and mild interstitial edema. Streaky opacity at the left base may reflect atelectasis or an infiltrate. Old right clavicular fracture  and old rib fractures. IMPRESSION: Tracheostomy tube tip projects over tracheal air column and tip is about 2.4 cm superior to carina Cardiomegaly with central vascular congestion and mild diffuse edema. Streaky atelectasis or pneumonia at the left base. Electronically Signed   By: Jasmine Pang M.D.   On: 03/17/2017 14:02   Dg Chest Port 1 View  Result Date: 03/06/2017 CLINICAL DATA:  RESP FAILURE,,HX ASPIRATION PNA EXAM: PORTABLE CHEST 1 VIEW COMPARISON:  02/20/2017 FINDINGS: Tracheostomy tube tip projects over the tracheal air shadow with a mildly exaggerated angulation some of which could possibly be due to thoracic kyphosis, and similar to prior exams. Mild enlargement of the cardiopericardial silhouette with indistinct pulmonary vasculature and interstitial accentuation. The indistinct left perihilar airspace opacities improved. No visible pleural effusion. Evidence of prior bilateral rib fractures. I do not see a current pneumothorax. Deformity from old healed right clavicular fracture. IMPRESSION: 1. Improved airspace opacity in the left mid lung. 2. Cardiomegaly with indistinct pulmonary vasculature and interstitial accentuation which could reflect mild interstitial edema. 3. Old healed fractures bilaterally. 4. Tracheostomy tube noted, slightly exaggerated angulation between the tube in the trachea although this could be caused by kyphosis. Electronically Signed   By: Gaylyn Rong M.D.   On: 03/06/2017 07:13    EKG & Cardiac Imaging    EKG: None  Echocardiogram:  11/28/16 Study Conclusions - Left ventricle: The cavity size was normal. Systolic function was   severely  reduced. The estimated ejection fraction was in the   range of 25% to 30%. Wall motion was normal; there were no   regional wall motion abnormalities. Doppler parameters are   consistent with abnormal left ventricular relaxation (grade 1   diastolic dysfunction). There was no evidence of elevated   ventricular filling pressure by Doppler parameters. - Mitral valve: There was trivial regurgitation. - Left atrium: The atrium was mildly dilated. - Right ventricle: Systolic function was normal. - Pulmonic valve: Not visualized. There was no regurgitation. - Inferior vena cava: The vessel was normal in size.  Impressions: - Very limited image quality, however LVEF is severely impaired,   estimated at 25-30% with akinesis of all of the apical segments   and mid anteroseptal, anterior and anterolateral segments.   There is no thrombus on the contrast images.   A cardiac MRI is recommended for further LVEF evaluation.   Assessment & Plan    Acute on chronic systolic heart failure -Echocardiogram on 11/28/2016 shows EF of 25-30% (patient has a history of low EF), akinesis of all apical segments and mid anterior septal, anterior and anterolateral segments, grade 1 diastolic dysfunction. There was no thrombus seen on the contrast images. A cardiac MRI is recommended for further LV EF evaluation. -Patient has been diuresed with Lasix IV which is currently on hold due to declining kidney function -Currently he is having no significant signs and symptoms of fluid overload. His weight has decreased from an admission weight of 225 pounds to 209 pounds.  -Patient is on beta blocker. Currently no ACE-I/ARB due to renal insufficiency -CT of the chest is in progress. We will see if that shows any pulmonary edema. Also we will order BNP. Continue to hold diuretics pending the results of these tests  CAD -History of bare metal stent to the RCA in March/2016. The patient's sister reports that he also had an  MI in 1999 with subsequent EF of 45%. He was initially treated with aspirin and Plavix, no longer on Plavix, aspirin 81  mg continued. He is anticoagulated with Coumadin for left apical thrombus. -Currently without chest pain  Left apical mural thrombus -History of apical thrombus found in 11/2015, and was being anticoagulated with Coumadin per Dr.Hongalgi. On admission the patient's INR was subtherapeutic and he was placed on heparin. He is back on Coumadin with INR 2.48. The patient had apparently been on Eliquis in the past and had developed a stroke and so was switched to Coumadin to be continued indefinitely -Echocardiogram on 11/28/16 showed no thrombus on contrast images. A CT of the chest on 12/23/2016 showed some calcifications in the apex of the left ventricle which strongly suggests the presence of chronic left ventricular apical thrombus  Acute on chronic kidney dysfunction -Serum creatinine up to 2.26 with diuresis. This year serum creatinine has ranged from 1.77 to 3.06. Upon record review through care everywhere serum creatinine in early 2017 range from 1.17-1.35. -Continue to hold diuretic as long as patient not significantly volume overloaded  Hypertension -Blood pressure is currently well controlled  NSVT -Beta blocker was increased and currently no NSVT noted, patient is in sinus rhythm with frequent PVCs  Hyperlipidemia  -Last lipid panel on 12/20/2015 showed LDL 142 (was 99 in 08/2015). Unknown if patient was taking statin at this time. Would advise repeat Lipid panel -Continue atorvastatin 40 mg daily   Signed, Berton Bon, NP-C 03/29/2017, 11:11 AM Pager: [phone]  Patient examined chart reviewed. Discussed care with PA and nurse. Ischemic DCM with EF 25-30% history of LV apical thrombus on chronic anticoagulation. Multiple co morbidities including CVA with left hemiparesis tracheostomy and PEG tube. Admitted with aspiration. Markedly azotemic after diuresing in  hospital. No objective CHF with some 3rd spacing of fluid from being bed bound. Telemetry with some PVCls  Weight has gone from 225 to 2009.  Would continue to hold lasix. Have ordered BNP  CT chest pending but doubt CHF from elevated EDP. May have ARDS from aspiration pneumonia. Continue beta blocker for NSVT/PVCls Continue coumadin for bed bound State and history of apical thrombus. CAD appears stable with no active chest pain 81 mg ASA for history of BMS Exam with chronically ill white male. Left hemipararesis, tracheostomy, and PEG tube   Charlton Haws

## 2017-03-30 DIAGNOSIS — Z7982 Long term (current) use of aspirin: Secondary | ICD-10-CM | POA: Diagnosis not present

## 2017-03-30 DIAGNOSIS — N200 Calculus of kidney: Secondary | ICD-10-CM | POA: Diagnosis not present

## 2017-03-30 DIAGNOSIS — G8114 Spastic hemiplegia affecting left nondominant side: Secondary | ICD-10-CM | POA: Diagnosis not present

## 2017-03-30 DIAGNOSIS — Z931 Gastrostomy status: Secondary | ICD-10-CM | POA: Diagnosis not present

## 2017-03-30 DIAGNOSIS — J9621 Acute and chronic respiratory failure with hypoxia: Secondary | ICD-10-CM | POA: Diagnosis not present

## 2017-03-30 DIAGNOSIS — R0902 Hypoxemia: Secondary | ICD-10-CM | POA: Diagnosis not present

## 2017-03-30 DIAGNOSIS — J9600 Acute respiratory failure, unspecified whether with hypoxia or hypercapnia: Secondary | ICD-10-CM | POA: Diagnosis not present

## 2017-03-30 DIAGNOSIS — Z96642 Presence of left artificial hip joint: Secondary | ICD-10-CM | POA: Diagnosis not present

## 2017-03-30 DIAGNOSIS — A4102 Sepsis due to Methicillin resistant Staphylococcus aureus: Secondary | ICD-10-CM | POA: Diagnosis not present

## 2017-03-30 DIAGNOSIS — Z8679 Personal history of other diseases of the circulatory system: Secondary | ICD-10-CM | POA: Diagnosis not present

## 2017-03-30 DIAGNOSIS — F329 Major depressive disorder, single episode, unspecified: Secondary | ICD-10-CM | POA: Diagnosis not present

## 2017-03-30 DIAGNOSIS — J80 Acute respiratory distress syndrome: Secondary | ICD-10-CM | POA: Diagnosis not present

## 2017-03-30 DIAGNOSIS — I236 Thrombosis of atrium, auricular appendage, and ventricle as current complications following acute myocardial infarction: Secondary | ICD-10-CM | POA: Diagnosis not present

## 2017-03-30 DIAGNOSIS — F0390 Unspecified dementia without behavioral disturbance: Secondary | ICD-10-CM | POA: Diagnosis not present

## 2017-03-30 DIAGNOSIS — R5381 Other malaise: Secondary | ICD-10-CM | POA: Diagnosis not present

## 2017-03-30 DIAGNOSIS — Z930 Tracheostomy status: Secondary | ICD-10-CM | POA: Diagnosis not present

## 2017-03-30 DIAGNOSIS — D72820 Lymphocytosis (symptomatic): Secondary | ICD-10-CM | POA: Diagnosis not present

## 2017-03-30 DIAGNOSIS — I130 Hypertensive heart and chronic kidney disease with heart failure and stage 1 through stage 4 chronic kidney disease, or unspecified chronic kidney disease: Secondary | ICD-10-CM | POA: Diagnosis not present

## 2017-03-30 DIAGNOSIS — F05 Delirium due to known physiological condition: Secondary | ICD-10-CM | POA: Diagnosis not present

## 2017-03-30 DIAGNOSIS — R509 Fever, unspecified: Secondary | ICD-10-CM | POA: Diagnosis not present

## 2017-03-30 DIAGNOSIS — I10 Essential (primary) hypertension: Secondary | ICD-10-CM | POA: Diagnosis not present

## 2017-03-30 DIAGNOSIS — I5040 Unspecified combined systolic (congestive) and diastolic (congestive) heart failure: Secondary | ICD-10-CM | POA: Diagnosis not present

## 2017-03-30 DIAGNOSIS — J411 Mucopurulent chronic bronchitis: Secondary | ICD-10-CM | POA: Diagnosis not present

## 2017-03-30 DIAGNOSIS — I513 Intracardiac thrombosis, not elsewhere classified: Secondary | ICD-10-CM | POA: Diagnosis not present

## 2017-03-30 DIAGNOSIS — J449 Chronic obstructive pulmonary disease, unspecified: Secondary | ICD-10-CM | POA: Diagnosis not present

## 2017-03-30 DIAGNOSIS — N179 Acute kidney failure, unspecified: Secondary | ICD-10-CM | POA: Diagnosis not present

## 2017-03-30 DIAGNOSIS — S72145K Nondisplaced intertrochanteric fracture of left femur, subsequent encounter for closed fracture with nonunion: Secondary | ICD-10-CM | POA: Diagnosis not present

## 2017-03-30 DIAGNOSIS — I5022 Chronic systolic (congestive) heart failure: Secondary | ICD-10-CM | POA: Diagnosis not present

## 2017-03-30 DIAGNOSIS — D62 Acute posthemorrhagic anemia: Secondary | ICD-10-CM | POA: Diagnosis not present

## 2017-03-30 DIAGNOSIS — J9611 Chronic respiratory failure with hypoxia: Secondary | ICD-10-CM | POA: Diagnosis not present

## 2017-03-30 DIAGNOSIS — K219 Gastro-esophageal reflux disease without esophagitis: Secondary | ICD-10-CM | POA: Diagnosis not present

## 2017-03-30 DIAGNOSIS — Z794 Long term (current) use of insulin: Secondary | ICD-10-CM | POA: Diagnosis not present

## 2017-03-30 DIAGNOSIS — T83498A Other mechanical complication of other prosthetic devices, implants and grafts of genital tract, initial encounter: Secondary | ICD-10-CM | POA: Diagnosis not present

## 2017-03-30 DIAGNOSIS — J9509 Other tracheostomy complication: Secondary | ICD-10-CM | POA: Diagnosis not present

## 2017-03-30 DIAGNOSIS — J9620 Acute and chronic respiratory failure, unspecified whether with hypoxia or hypercapnia: Secondary | ICD-10-CM | POA: Diagnosis not present

## 2017-03-30 DIAGNOSIS — I69354 Hemiplegia and hemiparesis following cerebral infarction affecting left non-dominant side: Secondary | ICD-10-CM | POA: Diagnosis not present

## 2017-03-30 DIAGNOSIS — Z431 Encounter for attention to gastrostomy: Secondary | ICD-10-CM | POA: Diagnosis not present

## 2017-03-30 DIAGNOSIS — D649 Anemia, unspecified: Secondary | ICD-10-CM | POA: Diagnosis not present

## 2017-03-30 DIAGNOSIS — M25552 Pain in left hip: Secondary | ICD-10-CM | POA: Diagnosis not present

## 2017-03-30 DIAGNOSIS — J9612 Chronic respiratory failure with hypercapnia: Secondary | ICD-10-CM | POA: Diagnosis not present

## 2017-03-30 DIAGNOSIS — R1312 Dysphagia, oropharyngeal phase: Secondary | ICD-10-CM | POA: Diagnosis not present

## 2017-03-30 DIAGNOSIS — J9503 Malfunction of tracheostomy stoma: Secondary | ICD-10-CM | POA: Diagnosis not present

## 2017-03-30 DIAGNOSIS — D729 Disorder of white blood cells, unspecified: Secondary | ICD-10-CM | POA: Diagnosis not present

## 2017-03-30 DIAGNOSIS — R1310 Dysphagia, unspecified: Secondary | ICD-10-CM | POA: Diagnosis not present

## 2017-03-30 DIAGNOSIS — I509 Heart failure, unspecified: Secondary | ICD-10-CM | POA: Diagnosis not present

## 2017-03-30 DIAGNOSIS — E119 Type 2 diabetes mellitus without complications: Secondary | ICD-10-CM | POA: Diagnosis not present

## 2017-03-30 DIAGNOSIS — I714 Abdominal aortic aneurysm, without rupture: Secondary | ICD-10-CM | POA: Diagnosis not present

## 2017-03-30 DIAGNOSIS — N184 Chronic kidney disease, stage 4 (severe): Secondary | ICD-10-CM | POA: Diagnosis not present

## 2017-03-30 DIAGNOSIS — Z79899 Other long term (current) drug therapy: Secondary | ICD-10-CM | POA: Diagnosis not present

## 2017-03-30 DIAGNOSIS — I110 Hypertensive heart disease with heart failure: Secondary | ICD-10-CM | POA: Diagnosis not present

## 2017-03-30 DIAGNOSIS — M19012 Primary osteoarthritis, left shoulder: Secondary | ICD-10-CM | POA: Diagnosis not present

## 2017-03-30 DIAGNOSIS — Z9861 Coronary angioplasty status: Secondary | ICD-10-CM | POA: Diagnosis not present

## 2017-03-30 DIAGNOSIS — I2510 Atherosclerotic heart disease of native coronary artery without angina pectoris: Secondary | ICD-10-CM | POA: Diagnosis not present

## 2017-03-30 DIAGNOSIS — I5043 Acute on chronic combined systolic (congestive) and diastolic (congestive) heart failure: Secondary | ICD-10-CM | POA: Diagnosis not present

## 2017-03-30 DIAGNOSIS — E46 Unspecified protein-calorie malnutrition: Secondary | ICD-10-CM | POA: Diagnosis not present

## 2017-03-30 DIAGNOSIS — E782 Mixed hyperlipidemia: Secondary | ICD-10-CM | POA: Diagnosis not present

## 2017-03-30 DIAGNOSIS — Z888 Allergy status to other drugs, medicaments and biological substances status: Secondary | ICD-10-CM | POA: Diagnosis not present

## 2017-03-30 DIAGNOSIS — A419 Sepsis, unspecified organism: Secondary | ICD-10-CM | POA: Diagnosis not present

## 2017-03-30 DIAGNOSIS — J189 Pneumonia, unspecified organism: Secondary | ICD-10-CM | POA: Diagnosis not present

## 2017-03-30 DIAGNOSIS — F418 Other specified anxiety disorders: Secondary | ICD-10-CM | POA: Diagnosis not present

## 2017-03-30 DIAGNOSIS — J690 Pneumonitis due to inhalation of food and vomit: Secondary | ICD-10-CM | POA: Diagnosis not present

## 2017-03-30 DIAGNOSIS — I4581 Long QT syndrome: Secondary | ICD-10-CM | POA: Diagnosis not present

## 2017-03-30 DIAGNOSIS — R0602 Shortness of breath: Secondary | ICD-10-CM | POA: Diagnosis not present

## 2017-03-30 DIAGNOSIS — T859XXA Unspecified complication of internal prosthetic device, implant and graft, initial encounter: Secondary | ICD-10-CM | POA: Diagnosis not present

## 2017-03-30 DIAGNOSIS — L89159 Pressure ulcer of sacral region, unspecified stage: Secondary | ICD-10-CM | POA: Diagnosis not present

## 2017-03-30 DIAGNOSIS — Z7901 Long term (current) use of anticoagulants: Secondary | ICD-10-CM | POA: Diagnosis not present

## 2017-03-30 DIAGNOSIS — I5021 Acute systolic (congestive) heart failure: Secondary | ICD-10-CM | POA: Diagnosis not present

## 2017-03-30 DIAGNOSIS — Z87891 Personal history of nicotine dependence: Secondary | ICD-10-CM | POA: Diagnosis not present

## 2017-03-30 DIAGNOSIS — I2109 ST elevation (STEMI) myocardial infarction involving other coronary artery of anterior wall: Secondary | ICD-10-CM | POA: Diagnosis not present

## 2017-03-30 DIAGNOSIS — I429 Cardiomyopathy, unspecified: Secondary | ICD-10-CM | POA: Diagnosis not present

## 2017-03-30 LAB — BASIC METABOLIC PANEL
Anion gap: 12 (ref 5–15)
BUN: 78 mg/dL — ABNORMAL HIGH (ref 6–20)
CHLORIDE: 104 mmol/L (ref 101–111)
CO2: 28 mmol/L (ref 22–32)
Calcium: 9.4 mg/dL (ref 8.9–10.3)
Creatinine, Ser: 2.18 mg/dL — ABNORMAL HIGH (ref 0.61–1.24)
GFR calc non Af Amer: 31 mL/min — ABNORMAL LOW (ref >60)
GFR, EST AFRICAN AMERICAN: 36 mL/min — AB (ref >60)
Glucose, Bld: 128 mg/dL — ABNORMAL HIGH (ref 65–99)
POTASSIUM: 3.9 mmol/L (ref 3.5–5.1)
SODIUM: 144 mmol/L (ref 135–145)

## 2017-03-30 LAB — GLUCOSE, CAPILLARY
GLUCOSE-CAPILLARY: 128 mg/dL — AB (ref 65–99)
GLUCOSE-CAPILLARY: 121 mg/dL — AB (ref 65–99)
Glucose-Capillary: 114 mg/dL — ABNORMAL HIGH (ref 65–99)
Glucose-Capillary: 116 mg/dL — ABNORMAL HIGH (ref 65–99)
Glucose-Capillary: 119 mg/dL — ABNORMAL HIGH (ref 65–99)

## 2017-03-30 LAB — PROTIME-INR
INR: 2.65
Prothrombin Time: 28.8 seconds — ABNORMAL HIGH (ref 11.4–15.2)

## 2017-03-30 LAB — BRAIN NATRIURETIC PEPTIDE: B Natriuretic Peptide: 199.5 pg/mL — ABNORMAL HIGH (ref 0.0–100.0)

## 2017-03-30 MED ORDER — OXYCODONE-ACETAMINOPHEN 5-325 MG PO TABS: 1.0000 | ORAL_TABLET | Freq: Three times a day (TID) | ORAL | 0 refills | Status: DC

## 2017-03-30 MED ORDER — FREE WATER: 150.0000 mL | 1 refills | Status: DC

## 2017-03-30 NOTE — Progress Notes (Signed)
Progress Note  Patient Name: Curtis Keller Date of Encounter: 03/30/2017  Primary Cardiologist: Rexene Edison  Subjective   Not verbal discussed issues with daughter  Inpatient Medications    Scheduled Meds: . aspirin  81 mg Per Tube Daily  . atorvastatin  40 mg Per Tube QHS  . chlorhexidine  15 mL Mouth Rinse BID  . famotidine  20 mg Per Tube Daily  . feeding supplement (PRO-STAT SUGAR FREE 64)  30 mL Per Tube BID  . FLUoxetine  60 mg Per Tube Daily  . folic acid  1 mg Per Tube Daily  . free water  100 mL Per Tube Q4H  . insulin aspart  0-15 Units Subcutaneous Q4H  . mouth rinse  15 mL Mouth Rinse q12n4p  . metoprolol tartrate  37.5 mg Per Tube BID  . multivitamin with minerals  1 tablet Per Tube Daily  . OLANZapine  5 mg Per Tube BID  . polyethylene glycol  17 g Per Tube Daily  . senna  1 tablet Per Tube BID  . sodium chloride flush  3 mL Intravenous Q12H  . Valproate Sodium  250 mg Per Tube TID  . cyanocobalamin  1,000 mcg Per Tube Daily  . warfarin  6 mg Oral q1800  . Warfarin - Pharmacist Dosing Inpatient   Does not apply q1800   Continuous Infusions: . sodium chloride 250 mL (03/25/17 2000)  . feeding supplement (OSMOLITE 1.5 CAL) 1,000 mL (03/29/17 2147)   PRN Meds: sodium chloride, acetaminophen (TYLENOL) oral liquid 160 mg/5 mL, oxyCODONE-acetaminophen, sodium chloride flush   Vital Signs    Vitals:   03/29/17 2151 03/30/17 0443 03/30/17 0822 03/30/17 1128  BP: 118/73 122/74    Pulse: 72 75 74   Resp: 19 18 18    Temp: 97.8 F (36.6 C) 97.9 F (36.6 C)    TempSrc: Oral Oral    SpO2: 96%  91% 91%  Weight:  200 lb (90.7 kg)    Height:        Intake/Output Summary (Last 24 hours) at 03/30/17 1143 Last data filed at 03/30/17 0618  Gross per 24 hour  Intake             3018 ml  Output              850 ml  Net             2168 ml   Filed Weights   03/28/17 0358 03/29/17 0416 03/30/17 0443  Weight: 210 lb 1.6 oz (95.3 kg) 209 lb (94.8 kg) 200 lb  (90.7 kg)    Telemetry    NSR 03/30/2017  - Personally Reviewed  ECG    SR PVC ICLBBB  - Personally Reviewed  Physical Exam  Somulent Tracheostomy Left hemiplegia GEN: No acute distress.   Neck: No JVD Cardiac: RRR, no murmurs, rubs, or gallops.  Respiratory: Clear to auscultation bilaterally. GI: PEG tube  MS: No edema; No deformity. Psych: Normal affect   Labs    Chemistry Recent Labs Lab 03/28/17 0603 03/29/17 0523 03/30/17 0629  NA 142 145 144  K 3.5 3.7 3.9  CL 104 104 104  CO2 30 32 28  GLUCOSE 132* 160* 128*  BUN 82* 82* 78*  CREATININE 2.21* 2.26* 2.18*  CALCIUM 9.1 9.4 9.4  PROT  --  7.6  --   ALBUMIN  --  2.2*  --   AST  --  34  --   ALT  --  23  --   ALKPHOS  --  95  --   BILITOT  --  0.4  --   GFRNONAA 30* 29* 31*  GFRAA 35* 34* 36*  ANIONGAP 8 9 12      Hematology Recent Labs Lab 03/26/17 0152 03/29/17 0523  WBC 10.8* 11.1*  RBC 3.46* 3.29*  HGB 10.9* 10.4*  HCT 36.2* 33.9*  MCV 104.6* 103.0*  MCH 31.5 31.6  MCHC 30.1 30.7  RDW 15.7* 15.5  PLT 363 319    Cardiac EnzymesNo results for input(s): TROPONINI in the last 168 hours. No results for input(s): TROPIPOC in the last 168 hours.   BNP Recent Labs Lab 03/30/17 0629  BNP 199.5*     DDimer No results for input(s): DDIMER in the last 168 hours.   Radiology    Ct Chest Wo Contrast  Result Date: 03/29/2017 CLINICAL DATA:  Shortness of breath, history respiratory failure requiring increased oxygen usage today, tracheostomy dependent, denies chest pain EXAM: CT CHEST WITHOUT CONTRAST TECHNIQUE: Multidetector CT imaging of the chest was performed following the standard protocol without IV contrast. Sagittal and coronal MPR images reconstructed from axial data set. COMPARISON:  12/23/2016 FINDINGS: Cardiovascular: Atherosclerotic calcifications aorta, proximal great vessels, and coronary arteries ascending aorta aneurysmally dilated 4.5 cm transverse image 62. No pericardial effusion.  Calcifications at cardiac apex question prior MI. Mediastinum/Nodes: Esophagus unremarkable. Tracheostomy tube within trachea. Scattered normal sized mediastinal lymph nodes largest 10 mm RIGHT paratracheal node image 47 and 10 mm para-aortic node image 53. Base of cervical region unremarkable. Lungs/Pleura: Scattered peripheral chronic interstitial infiltrates in both lungs. Dependent atelectasis in both lower lobes improved since previous exam. Central peribronchial thickening greatest in lower lobes. Scattered subpleural blebs at the apices greater on RIGHT. RIGHT upper lobe nodular density 9 mm diameter image 42 unchanged. No pleural effusion or pneumothorax. Upper Abdomen: Gastrostomy tube within collapsed stomach. Post splenectomy with question splenule at splenectomy bed. Visualized upper abdomen otherwise unremarkable. Musculoskeletal: Old appearing compression deformity of an upper thoracic vertebra approximately T6. Bones demineralized. No other focal bony abnormalities. IMPRESSION: Aortic atherosclerosis and coronary arterial calcifications aneurysmal dilatation of the ascending thoracic aorta 4.5 cm transverse, recommendation below. Ascending thoracic aortic aneurysm. Recommend semi-annual imaging followup by CTA or MRA and referral to cardiothoracic surgery if not already obtained. This recommendation follows 2010 ACCF/AHA/AATS/ACR/ASA/SCA/SCAI/SIR/STS/SVM Guidelines for the Diagnosis and Management of Patients With Thoracic Aortic Disease. Circulation. 2010; 121: Z610-R604 Mural calcification at cardiac apex question prior MI. Bronchitic changes with scattered chronic peripheral interstitial infiltrates in both lungs and improved bibasilar atelectasis. No definite acute infiltrate. Stable 9 mm RIGHT upper lobe nodule. Upper normal sized mediastinal lymph nodes without definite thoracic adenopathy. Electronically Signed   By: Ulyses Southward M.D.   On: 03/29/2017 12:45    Cardiac Studies   Echo 12/26/16   EF 25-30%   Patient Profile     61 y.o. male BMS to RCA 2016 with chronic systolic dysfunction Post CVA Admitted with aspiration pneumonia  Assessment & Plan    1) CHF:  Weight down about 16 lbs with marked azotemia diuretic held Continue other cardiac meds Volume is dry Ok to d/c to SNF Should have BMET in a week and f/u [facility] cardiology Beta blockers for PVCls no VT Continue ASA for BMS no active ischemia or infarct  Signed, Charlton Haws, MD  03/30/2017, 11:43 AM

## 2017-03-30 NOTE — Evaluation (Addendum)
Clinical/Bedside Swallow Evaluation Patient Details  Name: Curtis Keller MRN: [id number] Date of Birth: [birth year]  Today's Date: 03/30/2017 Time: SLP Start Time (ACUTE ONLY): 1200 SLP Stop Time (ACUTE ONLY): 1234 SLP Time Calculation (min) (ACUTE ONLY): 34 min  Past Medical History:  Past Medical History:  Diagnosis Date  . Acute respiratory failure (HCC)    a. Spring 2017 following fall/splenectomy -->admitted to [facility]-->trach/g tube.  . Anemia    a. 12/2015 ABL in setting of fall/hematomas/splenic laceration req splenectomy.  Marland Kitchen Apical mural thrombus    a. 11/2015 Echo: EF 25-30%, moderate size apical thrombus-->coumadin;  b. 12/2015 f/u Echo: EF 25-30%, ant/septa HK, no obvious large thrombus but cannot exclude small mural thrombus.  Marland Kitchen CAD (coronary artery disease)    a. 12/2014 s/p BMS to the RCA.  Marland Kitchen Cerebral infarction (HCC)   . Chronic systolic CHF (congestive heart failure) (HCC)    a. 12/2015 Echo: EF 25-30%.  . Dementia   . Depression   . Dysphagia   . Falls    a. 11/2015 traumatic fall with resultant trauma req splenectomy and prolonged hospitalization complicated by resp failure.  Marland Kitchen History of pneumonia   . Hyperlipidemia   . Hypertension   . Ischemic cardiomyopathy    a. 12/2015 Echo: EF 25-30%, anterior and septal HK.  Marland Kitchen Left hemiplegia (HCC)   . Protein calorie malnutrition (HCC)   . Respiratory failure (HCC)   . Spleen injury   . Status post tracheostomy (HCC)    a. 01/2016 in setting of ongoing resp failure and aspiration.   Past Surgical History:  Past Surgical History:  Procedure Laterality Date  . CARDIAC CATHETERIZATION    . CORONARY ANGIOPLASTY WITH STENT PLACEMENT    . HIP ARTHROPLASTY Left 02/10/2017   Procedure: LEFT HIP HEMIARTHROPLASTY;  Surgeon: [name], MD;  Location: [facility] OR;  Service: Orthopedics;  Laterality: Left;  . IR GASTROSTOMY TUBE MOD SED  02/02/2017  . IR GENERIC HISTORICAL  07/22/2016   IR GASTROSTOMY TUBE REMOVAL  07/22/2016 Curtis Come, MD [facility]-INTERV RAD  . PR LAP, SPLENECTOMY  01/07/2016  . TRACHEOSTOMY TUBE PLACEMENT N/A 02/22/2016   Procedure: TRACHEOSTOMY;  Surgeon: [name], MD;  Location: [facility] OR;  Service: ENT;  Laterality: N/A;  . TRACHEOSTOMY TUBE PLACEMENT N/A 01/14/2017   Procedure: TRACHEOSTOMY;  Surgeon: [name], MD;  Location: [facility] OR;  Service: ENT;  Laterality: N/A;  inserted #[id number]   HPI:  Pt is a 61 year old male with a history of respiratory failure, chronic illness, and dysphagia. He has transitioned repeatedly through acute care, LTACH and SNF settings over the past year. He has a PEG tube but when he is in his best condition has tolerated thickened liquids and soft solids as well as thin liquids in between meals. He is often impulsive and has had aspiration events with solid foods due to lack of dentition and poor safety awareness. Pt has mulitple prior MBS with the last on LTACH though those records are not available. Per his sister he was recommended to consume honey thick liquids and pureed solids. Prior to this admission, pt had been strictly NPO for at least a month, but is currently diagnosed with aspiration pna. Current CT shows Scattered peripheral chronic interstitial infiltrates in both lungs. Dependent atelectasis in both lower lobes improved since previous exam. Central peribronchial thickening greatest in lower lobes.    Assessment / Plan / Recommendation Clinical Impression  Given that pt has been  NPO for over a month since his last acute illness per his sister's report, it is unlikely that current diagnosis of aspiration pna is related to PO intake. He has persistent risk of aspiration pneumonia due to aspiration of secretions with high oral bacterial load given NPO status and poor oral care, as well as risk of aspiration of gastric reflux. SLP discussed pts history at length with sister who is aware of chronic dysphagia but is hopeful for pts  improvement and ability to eat and drink when he is stronger. Pts safety with PO will wax and wane based on arousal, functional reserve, ability to cough to protect airway, etc.   Recommend pt remain NPO for now as he is poroly arousable with SLP efforts at oral care. He should have f/u with SLP at SNF to gradual return to PO trials and eventual objective testing when he can tolerate the procedure. Reinforced the importance of oral care 3x a day with pts sister to decrease risk of aspiration pneumonia secondary to bacterial load of secretions. Will sign off as pt will d/c today.  SLP Visit Diagnosis: Dysphagia, oropharyngeal phase (R13.12)    Aspiration Risk  Severe aspiration risk;Risk for inadequate nutrition/hydration    Diet Recommendation NPO;Alternative means - long-term   Medication Administration: Via alternative means    Other  Recommendations Oral Care Recommendations: Oral care QID   Follow up Recommendations Skilled Nursing facility      Frequency and Duration            Prognosis        Swallow Study   General HPI: Pt is a 61 year old male with a history of respiratory failure, chronic illness, and dysphagia. He has transitioned repeatedly through acute care, LTACh and SNF settings over the past year. He has a PEG tube but when he is in his best condition ahs toelrated thickened liquids and soft solids as well as thin liquids in between meals. He is often impulsive and has had aspiration events with solid foods due to lack of dentition and poor safety awarenes. Pt has mulitple prior MBS with the last on LTACh though those records are not available. Per his sister he was recommended to consume honey thick liquids and pureed solids. Prior to this admission, pt had been strictly NPO for at least a month, so current diagnosis of aspiration pna is not related to PO intake. Current CT shows Scattered peripheral chronic interstitial infiltrates in both lungs. Dependent atelectasis in  both lower lobes improved since previous exam. Central peribronchial thickening greatest in lower lobes.  Type of Study: Bedside Swallow Evaluation Previous Swallow Assessment: see HPI Diet Prior to this Study: NPO;PEG tube Temperature Spikes Noted: No Respiratory Status: Trach Collar History of Recent Intubation: No Behavior/Cognition: Lethargic/Drowsy Oral Cavity Assessment:  (thick oral secretions) Oral Care Completed by SLP: Yes Oral Cavity - Dentition: Edentulous Baseline Vocal Quality: Low vocal intensity Volitional Cough: Cognitively unable to elicit Volitional Swallow: Unable to elicit    Oral/Motor/Sensory Function Overall Oral Motor/Sensory Function: Generalized oral weakness   Ice Chips Ice chips: Not tested   Thin Liquid Thin Liquid: Not tested    Nectar Thick Nectar Thick Liquid: Not tested   Honey Thick Honey Thick Liquid: Not tested   Puree Puree: Not tested   Solid   GO   Solid: Not tested       Harlon Ditty, MA CCC-SLP [phone]  Fitzroy Mikami, Riley Nearing 03/30/2017,1:43 PM

## 2017-03-30 NOTE — Progress Notes (Signed)
Patient will DC to: [facility] Anticipated DC date: 03/30/17 Family notified: Sister Transport by: Sharin Mons   Per MD patient ready for DC to [facility]. RN, patient, patient's family, and facility notified of DC. Discharge Summary sent to facility. RN given number for report [phone]). DC packet on chart. Ambulance transport requested for patient.   CSW signing off.  Cristobal Goldmann, [place] Clinical Social Worker [phone]

## 2017-03-30 NOTE — Discharge Summary (Addendum)
Physician Discharge Summary  Curtis Keller MRN: [id number] DOB/AGE: [id number] 62 y.o.  PCP: Gildardo Cranker, DO   Admit date: 03/17/2017 Discharge date: 03/30/2017  Discharge Diagnoses:    Active Problems:   Heart failure, chronic systolic (HCC)   Depression   Apical mural thrombus   Protein calorie malnutrition (HCC)   PEG (percutaneous endoscopic gastrostomy) status (HCC)   COPD (chronic obstructive pulmonary disease) (HCC)   GERD (gastroesophageal reflux disease)   Diabetes mellitus, type II, insulin dependent (HCC)   Pneumonia   HCAP (healthcare-associated pneumonia)   Pressure injury of skin   SOB (shortness of breath)   Hypoxia   Acute on chronic respiratory failure with hypoxia (HCC)   Tracheostomy status ([place])    Follow-up recommendations Follow-up with PCP in 3-5 days , including all  additional recommended appointments as below Follow-up CBC, CMP in 3-5 days Patient 's care discussed with sister on multiple occasions during this admission Check renal function weekly Repeat chest x-ray in 4-6 weeks to ensure resolution of left lower lobe pneumonia Daily INR checks, adjust Coumadin accordingly   SLP recommendations  Recommend pt remain NPO for now as he is poroly arousable with SLP efforts at oral care. He should have f/u with SLP at SNF to gradual return to PO trials and eventual objective testing when he can tolerate the procedure. Reinforced the importance of oral care 3x a day with pts sister to decrease risk of aspiration pneumonia secondary to bacterial load of secretions  Current Discharge Medication List    START taking these medications   Details  Water For Irrigation, Sterile (FREE WATER) SOLN Place 150 mLs into feeding tube every 4 (four) hours. Qty: 1000 mL, Refills: 1      CONTINUE these medications which have CHANGED   Details  oxyCODONE-acetaminophen (PERCOCET/ROXICET) 5-325 MG tablet Place 1 tablet into feeding tube every 8 (eight) hours.  Give 1 tablet via G-tube every 8 hours for pain Qty: 4 tablet, Refills: 0      CONTINUE these medications which have NOT CHANGED   Details  Amino Acids-Protein Hydrolys (FEEDING SUPPLEMENT, PRO-STAT SUGAR FREE 64,) LIQD Place 60 mLs into feeding tube 2 (two) times daily.     aspirin 81 MG tablet Place 81 mg into feeding tube daily.    atorvastatin (LIPITOR) 40 MG tablet Place 40 mg into feeding tube at bedtime. for Lipids    bacitracin 500 UNIT/GM ointment Apply 1 application topically See admin instructions. Apply to left hip topically every day shift for wound care - cover with dry dressing    cyanocobalamin 500 MCG tablet Place 1,000 mcg into feeding tube daily.    famotidine (PEPCID) 20 MG tablet Place 20 mg into feeding tube 2 (two) times daily. for GERD    FLUoxetine (PROZAC) 20 MG capsule Place 60 mg into feeding tube daily.    folic acid (FOLVITE) 1 MG tablet Place 1 mg into feeding tube daily.     guaiFENesin 200 MG tablet Place 200 mg into feeding tube 3 (three) times daily. for CHF    lidocaine (LIDODERM) 5 % Place 1 patch onto the skin daily. Remove & Discard patch within 12 hours or as directed by MD Qty: 5 patch, Refills: 0    metoCLOPramide (REGLAN) 5 MG tablet Place 5 mg into feeding tube See admin instructions. Ordered 03/16/17 - 1 tablet (5 mg) per tube every 8 hours for GERD for 3 days    metoprolol tartrate (LOPRESSOR) 25 MG tablet Place 25  mg into feeding tube 2 (two) times daily.    Multiple Vitamin (MULTIVITAMIN WITH MINERALS) TABS tablet Place 1 tablet into feeding tube daily.    Nutritional Supplements (NUTRITIONAL SUPPLEMENT PO) Place into feeding tube See admin instructions. nephro 1.8 per PEG tube at 55 ml/hr continuous every shift for diet    OLANZapine (ZYPREXA) 5 MG tablet Place 5 mg into feeding tube 2 (two) times daily. Give 1 tablet via G-tube two times daily     Omega-3 Fatty Acids (FISH OIL) 1000 MG CAPS Place 1,000 mg into feeding tube daily.     OXYGEN See admin instructions. Oxygen at 28% via trach cuff every shift    polyethylene glycol (MIRALAX / GLYCOLAX) packet Place 17 g into feeding tube daily. for constipation    senna (SENOKOT) 8.6 MG TABS tablet Place 1 tablet into feeding tube 2 (two) times daily. For constipation    tamsulosin (FLOMAX) 0.4 MG CAPS capsule 0.4 mg every evening.     Valproate Sodium (DEPAKENE) 250 MG/5ML SOLN solution Place 250 mg into feeding tube 3 (three) times daily.     warfarin (COUMADIN) 6 MG tablet Place 6 mg into feeding tube every evening.       STOP taking these medications     aspirin EC 81 MG tablet      FLUoxetine HCl 60 MG TABS          Discharge Condition:    Discharge Instructions Get Medicines reviewed and adjusted: Please take all your medications with you for your next visit with your Primary MD  Please request your Primary MD to go over all hospital tests and procedure/radiological results at the follow up, please ask your Primary MD to get all Hospital records sent to his/her office.  If you experience worsening of your admission symptoms, develop shortness of breath, life threatening emergency, suicidal or homicidal thoughts you must seek medical attention immediately by calling 911 or calling your MD immediately if symptoms less severe.  You must read complete instructions/literature along with all the possible adverse reactions/side effects for all the Medicines you take and that have been prescribed to you. Take any new Medicines after you have completely understood and accpet all the possible adverse reactions/side effects.   Do not drive when taking Pain medications.   Do not take more than prescribed Pain, Sleep and Anxiety Medications  Special Instructions: If you have smoked or chewed Tobacco in the last 2 yrs please stop smoking, stop any regular Alcohol and or any Recreational drug use.  Wear Seat belts while driving.  Please note  You were  cared for by a hospitalist during your hospital stay. Once you are discharged, your primary care physician will handle any further medical issues. Please note that NO REFILLS for any discharge medications will be authorized once you are discharged, as it is imperative that you return to your primary care physician (or establish a relationship with a primary care physician if you do not have one) for your aftercare needs so that they can reassess your need for medications and monitor your lab values.     Allergies  Allergen Reactions  . Codeine Anaphylaxis and Nausea And Vomiting  . Lisinopril Itching and Rash  . Hydrocodone Other (See Comments)    On MAR  . Hydrocodone-Acetaminophen Nausea And Vomiting    Cannot take any acetaminophen due to liver issues per sister  . Tegaderm Ag Mesh [Silver] Other (See Comments)    On MAR  . Tylenol [  Acetaminophen] Nausea Only and Other (See Comments)    Cannot take any acetaminophen due to liver issues per sister      Disposition: 03-Skilled Nursing Facility   Consults: * cardiology    Significant Diagnostic Studies:  Dg Chest 2 View  Result Date: 03/17/2017 CLINICAL DATA:  61 year old with chronic ventilator dependent respiratory failure presenting with cough. Current history of chronic systolic congestive heart failure, hypertension, cardiomyopathy and COPD. EXAM: CHEST  2 VIEW 3:48 p.m.: COMPARISON:  Portable chest x-ray earlier today 1:43 p.m., 03/06/2017 and earlier. FINDINGS: AP erect and lateral images were obtained. Tracheostomy tube tip in satisfactory position below the thoracic inlet projecting approximately 5 cm above carina. Patchy airspace opacities in the lung bases, left greater than right, similar to the examination earlier today, new since 03/06/2017. Slight improvement in the interstitial pulmonary edema since earlier today. No new pulmonary parenchymal abnormalities since earlier today. IMPRESSION: 1. Tracheostomy tube tip in  satisfactory position approximately 5 cm above carina. 2. Pneumonia involving the lung bases, left greater than right. 3. Improving interstitial pulmonary edema. Electronically Signed   By: Evangeline Dakin M.D.   On: 03/17/2017 16:14   Ct Chest Wo Contrast  Result Date: 03/29/2017 CLINICAL DATA:  Shortness of breath, history respiratory failure requiring increased oxygen usage today, tracheostomy dependent, denies chest pain EXAM: CT CHEST WITHOUT CONTRAST TECHNIQUE: Multidetector CT imaging of the chest was performed following the standard protocol without IV contrast. Sagittal and coronal MPR images reconstructed from axial data set. COMPARISON:  12/23/2016 FINDINGS: Cardiovascular: Atherosclerotic calcifications aorta, proximal great vessels, and coronary arteries ascending aorta aneurysmally dilated 4.5 cm transverse image 62. No pericardial effusion. Calcifications at cardiac apex question prior MI. Mediastinum/Nodes: Esophagus unremarkable. Tracheostomy tube within trachea. Scattered normal sized mediastinal lymph nodes largest 10 mm RIGHT paratracheal node image 47 and 10 mm para-aortic node image 53. Base of cervical region unremarkable. Lungs/Pleura: Scattered peripheral chronic interstitial infiltrates in both lungs. Dependent atelectasis in both lower lobes improved since previous exam. Central peribronchial thickening greatest in lower lobes. Scattered subpleural blebs at the apices greater on RIGHT. RIGHT upper lobe nodular density 9 mm diameter image 42 unchanged. No pleural effusion or pneumothorax. Upper Abdomen: Gastrostomy tube within collapsed stomach. Post splenectomy with question splenule at splenectomy bed. Visualized upper abdomen otherwise unremarkable. Musculoskeletal: Old appearing compression deformity of an upper thoracic vertebra approximately T6. Bones demineralized. No other focal bony abnormalities. IMPRESSION: Aortic atherosclerosis and coronary arterial calcifications  aneurysmal dilatation of the ascending thoracic aorta 4.5 cm transverse, recommendation below. Ascending thoracic aortic aneurysm. Recommend semi-annual imaging followup by CTA or MRA and referral to cardiothoracic surgery if not already obtained. This recommendation follows 2010 ACCF/AHA/AATS/ACR/ASA/SCA/SCAI/SIR/STS/SVM Guidelines for the Diagnosis and Management of Patients With Thoracic Aortic Disease. Circulation. 2010; 121: Y706-C376 Mural calcification at cardiac apex question prior MI. Bronchitic changes with scattered chronic peripheral interstitial infiltrates in both lungs and improved bibasilar atelectasis. No definite acute infiltrate. Stable 9 mm RIGHT upper lobe nodule. Upper normal sized mediastinal lymph nodes without definite thoracic adenopathy. Electronically Signed   By: Lavonia Dana M.D.   On: 03/29/2017 12:45   Dg Chest Port 1 View  Result Date: 03/27/2017 CLINICAL DATA:  Shortness of Breath EXAM: PORTABLE CHEST 1 VIEW COMPARISON:  March 24, 2017 FINDINGS: Tracheostomy catheter tip is 4.5 cm above the carina. No pneumothorax. There is patchy airspace consolidation in the left base. There is mild bibasilar atelectasis as well. Heart is borderline enlarged with pulmonary vascularity within normal limits. No  adenopathy. No bone lesions. IMPRESSION: Tracheostomy as described without pneumothorax. Patchy airspace opacity left base, suspect a degree of pneumonia. Patchy atelectasis in the bases as well. Stable cardiac silhouette. Electronically Signed   By: Lowella Grip III M.D.   On: 03/27/2017 09:32   Dg Chest Port 1 View  Result Date: 03/24/2017 CLINICAL DATA:  Hypoxia EXAM: PORTABLE CHEST 1 VIEW COMPARISON:  Two days ago FINDINGS: Tracheostomy tube in place. Cardiomegaly and diffuse interstitial opacity. Low lung volumes. Small layering pleural effusions are not excluded. No evidence of pneumothorax. IMPRESSION: 1. Cardiomegaly and mild pulmonary edema. 2. Low volume chest with  atelectasis. 3. No change from study 2 days prior. Electronically Signed   By: Monte Fantasia M.D.   On: 03/24/2017 14:47   Dg Chest Port 1 View  Result Date: 03/22/2017 CLINICAL DATA:  Hypoxia EXAM: PORTABLE CHEST 1 VIEW COMPARISON:  03/19/2017; 03/17/2017; 03/06/2017 FINDINGS: Grossly unchanged enlarged cardiac silhouette and mediastinal contours given AP projection. Stable position of support apparatus. Pulmonary vasculature remains indistinct with cephalization of flow. Minimally improved aeration of lungs with persistent bibasilar opacities, left greater than right. No new focal airspace opacities. No definite pleural effusion or pneumothorax. No acute osseus abnormalities. IMPRESSION: 1.  Stable positioning of support apparatus.  No pneumothorax. 2. Minimally improved bibasilar atelectasis with otherwise similar findings of mild pulmonary edema. Electronically Signed   By: Sandi Mariscal M.D.   On: 03/22/2017 09:15   Dg Chest Port 1 View  Result Date: 03/19/2017 CLINICAL DATA:  Shortness of breath EXAM: PORTABLE CHEST 1 VIEW COMPARISON:  03/17/2017; 03/06/2017; 01/27/2017 FINDINGS: Unchanged enlarged cardiac silhouette and mediastinal contours. Stable position of support apparatus. Pulmonary vasculature appears less distinct with cephalization of flow. Interval development of trace right and small left-sided effusion with associated worsening bibasilar opacities, left greater than right. No pneumothorax. No acute osseus abnormalities. Old right anterior rib fractures. IMPRESSION: 1.  No acute cardiopulmonary disease. 2. Suspected worsening pulmonary edema and bibasilar atelectasis, left greater than right. Electronically Signed   By: Sandi Mariscal M.D.   On: 03/19/2017 13:40   Dg Chest Port 1 View  Result Date: 03/17/2017 CLINICAL DATA:  Shortness of breath EXAM: PORTABLE CHEST 1 VIEW COMPARISON:  03/06/2017, 02/20/2017, 02/08/2017 FINDINGS: Tracheostomy tube tip about 2.4 cm superior to the carina and  projects over the tracheal air column. Cardiomegaly with central vascular congestion and mild interstitial edema. Streaky opacity at the left base may reflect atelectasis or an infiltrate. Old right clavicular fracture and old rib fractures. IMPRESSION: Tracheostomy tube tip projects over tracheal air column and tip is about 2.4 cm superior to carina Cardiomegaly with central vascular congestion and mild diffuse edema. Streaky atelectasis or pneumonia at the left base. Electronically Signed   By: Donavan Foil M.D.   On: 03/17/2017 14:02   Dg Chest Port 1 View  Result Date: 03/06/2017 CLINICAL DATA:  RESP FAILURE,,HX ASPIRATION PNA EXAM: PORTABLE CHEST 1 VIEW COMPARISON:  02/20/2017 FINDINGS: Tracheostomy tube tip projects over the tracheal air shadow with a mildly exaggerated angulation some of which could possibly be due to thoracic kyphosis, and similar to prior exams. Mild enlargement of the cardiopericardial silhouette with indistinct pulmonary vasculature and interstitial accentuation. The indistinct left perihilar airspace opacities improved. No visible pleural effusion. Evidence of prior bilateral rib fractures. I do not see a current pneumothorax. Deformity from old healed right clavicular fracture. IMPRESSION: 1. Improved airspace opacity in the left mid lung. 2. Cardiomegaly with indistinct pulmonary vasculature and interstitial accentuation  which could reflect mild interstitial edema. 3. Old healed fractures bilaterally. 4. Tracheostomy tube noted, slightly exaggerated angulation between the tube in the trachea although this could be caused by kyphosis. Electronically Signed   By: Van Clines M.D.   On: 03/06/2017 07:13            Filed Weights   03/28/17 0358 03/29/17 0416 03/30/17 0443  Weight: 95.3 kg (210 lb 1.6 oz) 94.8 kg (209 lb) 90.7 kg (200 lb)     Microbiology: No results found for this or any previous visit (from the past 240 hour(s)).     Blood Culture     Component Value Date/Time   SDES TRACHEAL ASPIRATE 03/19/2017 1155   SPECREQUEST NONE 03/19/2017 1155   CULT MODERATE CANDIDA GLABRATA 03/19/2017 1155   REPTSTATUS 03/21/2017 FINAL 03/19/2017 1155      Labs: Results for orders placed or performed during the hospital encounter of 03/17/17 (from the past 48 hour(s))  Glucose, capillary     Status: Abnormal   Collection Time: 03/28/17 10:08 AM  Result Value Ref Range   Glucose-Capillary 141 (H) 65 - 99 mg/dL   Comment 1 Document in Chart   Glucose, capillary     Status: Abnormal   Collection Time: 03/28/17 11:57 AM  Result Value Ref Range   Glucose-Capillary 141 (H) 65 - 99 mg/dL   Comment 1 Document in Chart   Glucose, capillary     Status: Abnormal   Collection Time: 03/28/17  6:02 PM  Result Value Ref Range   Glucose-Capillary 152 (H) 65 - 99 mg/dL   Comment 1 Notify RN    Comment 2 Document in Chart   Glucose, capillary     Status: Abnormal   Collection Time: 03/28/17  7:56 PM  Result Value Ref Range   Glucose-Capillary 138 (H) 65 - 99 mg/dL  Glucose, capillary     Status: Abnormal   Collection Time: 03/29/17 12:04 AM  Result Value Ref Range   Glucose-Capillary 122 (H) 65 - 99 mg/dL  Glucose, capillary     Status: Abnormal   Collection Time: 03/29/17  4:11 AM  Result Value Ref Range   Glucose-Capillary 147 (H) 65 - 99 mg/dL  Protime-INR     Status: Abnormal   Collection Time: 03/29/17  5:23 AM  Result Value Ref Range   Prothrombin Time 27.3 (H) 11.4 - 15.2 seconds   INR 2.48   Comprehensive metabolic panel     Status: Abnormal   Collection Time: 03/29/17  5:23 AM  Result Value Ref Range   Sodium 145 135 - 145 mmol/L   Potassium 3.7 3.5 - 5.1 mmol/L   Chloride 104 101 - 111 mmol/L   CO2 32 22 - 32 mmol/L   Glucose, Bld 160 (H) 65 - 99 mg/dL   BUN 82 (H) 6 - 20 mg/dL   Creatinine, Ser 2.26 (H) 0.61 - 1.24 mg/dL   Calcium 9.4 8.9 - 10.3 mg/dL   Total Protein 7.6 6.5 - 8.1 g/dL   Albumin 2.2 (L) 3.5 - 5.0 g/dL    AST 34 15 - 41 U/L   ALT 23 17 - 63 U/L   Alkaline Phosphatase 95 38 - 126 U/L   Total Bilirubin 0.4 0.3 - 1.2 mg/dL   GFR calc non Af Amer 29 (L) >60 mL/min   GFR calc Af Amer 34 (L) >60 mL/min    Comment: (NOTE) The eGFR has been calculated using the CKD EPI equation. This calculation has not  been validated in all clinical situations. eGFR's persistently <60 mL/min signify possible Chronic Kidney Disease.    Anion gap 9 5 - 15  CBC     Status: Abnormal   Collection Time: 03/29/17  5:23 AM  Result Value Ref Range   WBC 11.1 (H) 4.0 - 10.5 K/uL   RBC 3.29 (L) 4.22 - 5.81 MIL/uL   Hemoglobin 10.4 (L) 13.0 - 17.0 g/dL   HCT 33.9 (L) 39.0 - 52.0 %   MCV 103.0 (H) 78.0 - 100.0 fL   MCH 31.6 26.0 - 34.0 pg   MCHC 30.7 30.0 - 36.0 g/dL   RDW 15.5 11.5 - 15.5 %   Platelets 319 150 - 400 K/uL  Glucose, capillary     Status: Abnormal   Collection Time: 03/29/17  8:28 AM  Result Value Ref Range   Glucose-Capillary 111 (H) 65 - 99 mg/dL  Glucose, capillary     Status: Abnormal   Collection Time: 03/29/17 12:20 PM  Result Value Ref Range   Glucose-Capillary 144 (H) 65 - 99 mg/dL  Blood gas, arterial     Status: Abnormal   Collection Time: 03/29/17  4:15 PM  Result Value Ref Range   FIO2 30.00    Delivery systems OXYGEN MASK    pH, Arterial 7.485 (H) 7.350 - 7.450   pCO2 arterial 43.6 32.0 - 48.0 mmHg   pO2, Arterial 62.1 (L) 83.0 - 108.0 mmHg   Bicarbonate 32.5 (H) 20.0 - 28.0 mmol/L   Acid-Base Excess 8.7 (H) 0.0 - 2.0 mmol/L   O2 Saturation 91.5 %   Patient temperature 98.6    Collection site BRACHIAL ARTERY    Drawn by [id number]    Sample type ARTERIAL DRAW    Allens test (pass/fail) PASS PASS    Comment: Performed at [facility], [place] [address], [place],  [address]  Glucose, capillary     Status: Abnormal   Collection Time: 03/29/17  5:35 PM  Result Value Ref Range   Glucose-Capillary 133 (H) 65 - 99 mg/dL  Glucose, capillary     Status:  Abnormal   Collection Time: 03/29/17  9:46 PM  Result Value Ref Range   Glucose-Capillary 128 (H) 65 - 99 mg/dL  Glucose, capillary     Status: Abnormal   Collection Time: 03/30/17 12:12 AM  Result Value Ref Range   Glucose-Capillary 119 (H) 65 - 99 mg/dL  Glucose, capillary     Status: Abnormal   Collection Time: 03/30/17  4:32 AM  Result Value Ref Range   Glucose-Capillary 114 (H) 65 - 99 mg/dL  Protime-INR     Status: Abnormal   Collection Time: 03/30/17  5:23 AM  Result Value Ref Range   Prothrombin Time 28.8 (H) 11.4 - 15.2 seconds   INR 2.65   Brain natriuretic peptide     Status: Abnormal   Collection Time: 03/30/17  6:29 AM  Result Value Ref Range   B Natriuretic Peptide 199.5 (H) 0.0 - 100.0 pg/mL  Basic metabolic panel     Status: Abnormal   Collection Time: 03/30/17  6:29 AM  Result Value Ref Range   Sodium 144 135 - 145 mmol/L   Potassium 3.9 3.5 - 5.1 mmol/L   Chloride 104 101 - 111 mmol/L   CO2 28 22 - 32 mmol/L   Glucose, Bld 128 (H) 65 - 99 mg/dL   BUN 78 (H) 6 - 20 mg/dL   Creatinine, Ser 2.18 (H) 0.61 - 1.24 mg/dL  Calcium 9.4 8.9 - 10.3 mg/dL   GFR calc non Af Amer 31 (L) >60 mL/min   GFR calc Af Amer 36 (L) >60 mL/min    Comment: (NOTE) The eGFR has been calculated using the CKD EPI equation. This calculation has not been validated in all clinical situations. eGFR's persistently <60 mL/min signify possible Chronic Kidney Disease.    Anion gap 12 5 - 15  Glucose, capillary     Status: Abnormal   Collection Time: 03/30/17  7:42 AM  Result Value Ref Range   Glucose-Capillary 128 (H) 65 - 99 mg/dL   Comment 1 Document in Chart      Lipid Panel  No results found for: CHOL, TRIG, HDL, CHOLHDL, VLDL, LDLCALC, LDLDIRECT   Lab Results  Component Value Date   HGBA1C 5.3 03/22/2017   HGBA1C 5.2 02/06/2016      HPI :  61 y.o. male  past medical history significant for Tracheostomy dependent, on oxygen via trach collar, dysphagia status post PEG  tube feeding, COPD, CVA 11/2015 with residual left hemiparesis/contractures, chronic systolic CHF, dementia, recurrent falls, recent hip fracture surgery, LV apical thrombus who presented with hypoxia and was noted to have suspected aspiration pneumonia the patient has been slowly improving.   Patient treated for aspiration pneumonia, acute on chronic hypoxic respiratory failure, acute on chronic systolic heart failure, acute on chronic stage III kidney disease, anemia, and NSVT  System reports that patient had an MI in 1999 with stent placement and subsequent EF of 45%. She reports that he has had long-standing systolic dysfunction but rarely has signs and symptoms of heart failure. She is most concerned about his renal function. She reports that the patient had been started on Eliquis for LV thrombus but then developed a CVA and the patient was switched to warfarin and she was told to not let anyone ever take him off the warfarin.   The patient was last seen in a cardiology office on 06/10/15 by Dr. Sharlotte Alamo of [facility] in [facility] where it is noted that he has CAD with ischemic cardiomyopathy, EF approximately 30%, bare-metal stent to RCA in 12/2014. He also has hypercholesterolemia, hypertension well-controlled, half pack per day smoker, history of abdominal aortic aneurysm without rupture, hepatitis C. At that time his medications included Eliquis 5 mg twice a day, atorvastatin 40 mg daily, carvedilol 12.5 mg twice a day, and Plavix 75 mg daily.    His last echocardiogram through [facility] health care on 01/06/16 showed EF estimated to be 25-30% with anterior and septal wall hypokinesis. No obvious large thrombus, but could not rule out mono mural thrombus. Mild mitral regurgitation. Normal right ventricular size and function.  A previous echocardiogram on 12/19/2015 showed a moderate size apical thrombus, moderately dilated left ventricle, EF 25-30 percent,  moderate to severe septal hypokinesis and apical akinesis.     HOSPITAL COURSE:  1. Aspiration pneumonia: Likely related to dysphagia from prior CVA and apparently was fed by mouth at SNF. Initially started on IV vancomycin and cefepime which was changed to IV Unasyn. Strict nothing by mouth and aspiration precautions. Aggressive pulmonary toilet by RT. Tracheal aspirate showed moderate Candida. Blood cultures 2: Negative. Completed 8 days of IV antibiotics on 03/24/17. Continue pulmonary toilet and strict aspiration precautions. Speech therapy was consulted. They recommendations as above. Personally reviewed repeat chest x-ray 6/4: CT chest 6/6  to rule out pneumonia vs CHF, shows no acute lung pathology  2. Acute on chronic  hypoxic respiratory failure/s/p tracheostomy from recent VDRF: Apparently was on trach collar with FiO2 of 24-28% prior to admission. As per family, recently discharged from LTAC/select and was only at [facility] for a few hours prior to deterioration and hospitalization again. Acute respiratory failure secondary to aspiration pneumonia and decompensated CHF. Despite aggressive treatment, had ongoing high oxygen requirements and pulmonology was consulted. Treated for decompensated CHF with aggressive IV Lasix diuresis. Hypoxia resolved. Lasix had to be discontinued after 6/2  doses d/t increasing creatinine. Ejection fraction 25-30%. Currently requiring 28% FiO2.   Pulmonary recommends . Repeat chest x-ray in 4-6 weeks to ensure resolution of left lower lobe pneumonia. Critical care Has signed off .   3. Acute on chronic systolic CHF:  . Although clinically does not appear volume overloaded,Iinitial chest x-ray seemed to suggest pulmonary edema which is slowly improving. Lasix initially increased by pulmonology to 60 mg 2 times daily.  Lasix discontinued after 6/3 doses due to acute kidney injury.  Marland Kitchen No ACEI/ARB due to renal insufficiency. Cardiology consulted to rule out cardiorenal syndrome,  due to weight reduction from 225>209 lbs , lasix remains on hold  4. LV apical mural thrombus: INR was initially subtherapeutic and required heparin bridge. INR became supratherapeutic and being managed by pharmacy with reduced dosing. Continue daily INR checks 5. Acute on Stage III chronic kidney disease: Creatinine was stable in the 1.7-1.8 range. Creatinine jumped up from 1.68>1.94>2.09>2.21>2.26>2.18.Lasix discontinued after 6/3 doses due to worsening creatinine. Continue free water through PEG tube. 6. Mood disorder/dementia: Continue Depakote, Zyprexa and fluoxetine. Mental status probably at baseline. 7. RUE swelling: Venous Doppler 5/29: No DVT or SVT. 8. Buttock lesion: As per wound care consultation 5/30, generalized erythema, left buttock with partial-thickness linear ablation of unknown etiology and the appearance is not consistent with a pressure injury. Continue foam dressing as per their recommendations. 9. Hypokalemia: Replaced. 10. Anemia, chronic: Macrocytic. May be of chronic disease. Stable. 11. CVA with received dual left hemiparesis: Continue aspirin, statins, warfarin anticoagulation. 12. Hyperglycemia: Without diagnosis of DM. On SSI. A1c 5.3.. Reasonable inpatient control. 13. NSVT: Interim potassium greater than 4 and magnesium greater than 2.  metoprolol to 37.5 MG twice a day. No ACEI/ARB due to renal insufficiency.    Discharge Exam:   Blood pressure 122/74, pulse 74, temperature 97.9 F (36.6 C), temperature source Oral, resp. rate 18, height 6' 2"  (1.88 m), weight 90.7 kg (200 lb), SpO2 91 %.  Cardiovascular system: S1 & S2 heard, RRR. No JVD, murmurs, rubs, gallops or clicks. No pedal edema. Telemetry: Sinus rhythm with frequent PVCs. Brief NSVT noted at 1:05 PM on 6/3.  Gastrointestinal system: Abdomen is nondistended, soft and nontender. No organomegaly or masses felt. Normal bowel sounds heard. Central nervous system: Patient is sleepy. Follows simple  instructions. No focal neurological deficits. Extremities: Right limbs grade 5 x 5 power. Left limbs at least grade 2 x 5 power but has contractures. Mildly tender in distal left thigh without any other acute findings. Small linear cut in the left popliteal foci without acute findings of infection.     Contact information for follow-up providers    Gildardo Cranker, DO. Call.   Specialty:  Internal Medicine Why:  3-5 days  Contact information: [place] [id number] [phone]            Contact information for after-discharge care    Destination    [facility] Follow up.   Specialty:  Skilled Nursing Facility Contact information: [address] [place] [phone]                  Signed: Reyne Dumas 03/30/2017, 9:58 AM        Time spent >1 hour

## 2017-03-30 NOTE — Progress Notes (Signed)
NURSING PROGRESS NOTE  LLEYTON BYERS [id number] Discharge Data: 03/30/2017 4:37 PM Attending Provider: Richarda Overlie, MD WUJ:[id number], [place], DO     Lars Mage to be D/C'd [facility] per MD order. All IV's discontinued with no bleeding noted. All belongings returned to patient for patient to take home. Peg tube was clamped. I have attempted 3 times to call report to [facility] but no one answers the phone. I tried calling the number listed in social works progress note [phone]. EMS is here to transport patient.   Last Vital Signs:  Blood pressure 100/70, pulse 63, temperature 97.8 F (36.6 C), resp. rate 18, height 6\' 2"  (1.88 m), weight 90.7 kg (200 lb), SpO2 96 %.  Discharge Medication List Allergies as of 03/30/2017      Reactions   Codeine Anaphylaxis, Nausea And Vomiting   Lisinopril Itching, Rash   Hydrocodone Other (See Comments)   On MAR   Hydrocodone-acetaminophen Nausea And Vomiting   Cannot take any acetaminophen due to liver issues per sister   Tegaderm Ag Mesh [silver] Other (See Comments)   On MAR   Tylenol [acetaminophen] Nausea Only, Other (See Comments)   Cannot take any acetaminophen due to liver issues per sister      Medication List    TAKE these medications   aspirin 81 MG tablet Place 81 mg into feeding tube daily.   atorvastatin 40 MG tablet Commonly known as:  LIPITOR Place 40 mg into feeding tube at bedtime. for Lipids   bacitracin 500 UNIT/GM ointment Apply 1 application topically See admin instructions. Apply to left hip topically every day shift for wound care - cover with dry dressing   cyanocobalamin 500 MCG tablet Place 1,000 mcg into feeding tube daily.   DEPAKENE 250 MG/5ML Soln solution Generic drug:  Valproate Sodium Place 250 mg into feeding tube 3 (three) times daily.   famotidine 20 MG tablet Commonly known as:  PEPCID Place 20 mg into feeding tube 2 (two) times daily. for GERD   feeding  supplement (PRO-STAT SUGAR FREE 64) Liqd Place 60 mLs into feeding tube 2 (two) times daily.   Fish Oil 1000 MG Caps Place 1,000 mg into feeding tube daily.   FLUoxetine 20 MG capsule Commonly known as:  PROZAC Place 60 mg into feeding tube daily.   folic acid 1 MG tablet Commonly known as:  FOLVITE Place 1 mg into feeding tube daily.   free water Soln Place 150 mLs into feeding tube every 4 (four) hours.   guaiFENesin 200 MG tablet Place 200 mg into feeding tube 3 (three) times daily. for CHF   lidocaine 5 % Commonly known as:  LIDODERM Place 1 patch onto the skin daily. Remove & Discard patch within 12 hours or as directed by MD   metoCLOPramide 5 MG tablet Commonly known as:  REGLAN Place 5 mg into feeding tube See admin instructions. Ordered 03/16/17 - 1 tablet (5 mg) per tube every 8 hours for GERD for 3 days   metoprolol tartrate 25 MG tablet Commonly known as:  LOPRESSOR Place 25 mg into feeding tube 2 (two) times daily.   multivitamin with minerals Tabs tablet Place 1 tablet into feeding tube daily.   NUTRITIONAL SUPPLEMENT PO Place into feeding tube See admin instructions. nephro 1.8 per PEG tube at 55 ml/hr continuous every shift for diet   OLANZapine 5 MG tablet Commonly known as:  ZYPREXA Place 5 mg into feeding tube 2 (two)  times daily. Give 1 tablet via G-tube two times daily   oxyCODONE-acetaminophen 5-325 MG tablet Commonly known as:  PERCOCET/ROXICET Place 1 tablet into feeding tube every 8 (eight) hours. Give 1 tablet via G-tube every 8 hours for pain   OXYGEN See admin instructions. Oxygen at 28% via trach cuff every shift   polyethylene glycol packet Commonly known as:  MIRALAX / GLYCOLAX Place 17 g into feeding tube daily. for constipation   senna 8.6 MG Tabs tablet Commonly known as:  SENOKOT Place 1 tablet into feeding tube 2 (two) times daily. For constipation   tamsulosin 0.4 MG Caps capsule Commonly known as:  FLOMAX 0.4 mg every  evening.   warfarin 6 MG tablet Commonly known as:  COUMADIN Place 6 mg into feeding tube every evening.

## 2017-03-30 NOTE — Clinical Social Work Placement (Signed)
   CLINICAL SOCIAL WORK PLACEMENT  NOTE  Date:  03/30/2017  Patient Details  Name: Curtis Keller MRN: [id number] Date of Birth: 14-Aug-1956  Clinical Social Work is seeking post-discharge placement for this patient at the Skilled  Nursing Facility level of care (*CSW will initial, date and re-position this form in  chart as items are completed):  Yes   Patient/family provided with [facility] Clinical Social Work Department's list of facilities offering this level of care within the geographic area requested by the patient (or if unable, by the patient's family).  Yes   Patient/family informed of their freedom to choose among providers that offer the needed level of care, that participate in Medicare, Medicaid or managed care program needed by the patient, have an available bed and are willing to accept the patient.  Yes   Patient/family informed of [facility]'s ownership interest in [facility] and [facility], as well as of the fact that they are under no obligation to receive care at these facilities.  PASRR submitted to EDS on       PASRR number received on       Existing PASRR number confirmed on 03/27/17     FL2 transmitted to all facilities in geographic area requested by pt/family on 03/27/17     FL2 transmitted to all facilities within larger geographic area on       Patient informed that his/her managed care company has contracts with or will negotiate with certain facilities, including the following:        Yes   Patient/family informed of bed offers received.  Patient chooses bed at [facility]     Physician recommends and patient chooses bed at      Patient to be transferred to [facility] on 03/30/17.  Patient to be transferred to facility by [facility]     Patient family notified on 03/30/17 of transfer.  Name of family member notified:  [name]     PHYSICIAN       Additional Comment:     _______________________________________________ Mearl Latin, LCSWA 03/30/2017, 4:35 PM

## 2017-03-30 NOTE — Progress Notes (Signed)
Physical Therapy Treatment Patient Details Name: Curtis Keller MRN: [id number] DOB: [birth year] Today's Date: 03/30/2017    History of Present Illness Pt. with PMH of right dependent respiratory failure, requiring tracheostomy, dysphagia requiring PEG tube placement, COPD, CVA, residual weakness with contracture of the left upper and lower extremity, chronic systolic CHF, dementia, recurrent fall, recent hip fracture with surgery, LV apical thrombus; admitted on 03/17/2017, presented with complaint of hypoxia, was found to healthcare associated pneumonia.    PT Comments    Pt with minimal participation this session. He remained mostly with eyes closed, inconsistent with following one step commands. Pt became agitated with assisted UE therex and would not allow therapist to perform PROM of L UE. Pt pushing therapist away when performing PROM to L LE. PT will continue to follow acutely with hopes of progression of mobility. Pt would continue to benefit from skilled physical therapy services at this time while admitted and after d/c to address the below listed limitations in order to improve overall safety and independence with functional mobility.    Follow Up Recommendations  SNF;Supervision/Assistance - 24 hour     Equipment Recommendations  None recommended by PT    Recommendations for Other Services       Precautions / Restrictions Precautions Precautions: Fall Precaution Comments: L hip hemi 02/10/17 from injury 2/18 Restrictions Weight Bearing Restrictions: No    Mobility  Bed Mobility                  Transfers                    Ambulation/Gait                 Stairs            Wheelchair Mobility    Modified Rankin (Stroke Patients Only)       Balance                                            Cognition Arousal/Alertness: Awake/alert Behavior During Therapy: Agitated Overall Cognitive Status: Difficult to  assess                                 General Comments: pt minimally following commands      Exercises General Exercises - Lower Extremity Ankle Circles/Pumps: PROM;Left;10 reps;Supine Short Arc Quad: AROM;Strengthening;Right;5 reps;Supine Heel Slides: AROM;Right;5 reps;Supine Hip ABduction/ADduction: AROM;Right;5 reps;Supine Straight Leg Raises: AROM;Right;5 reps;Supine Other Exercises Other Exercises: PROM for L knee extension and ankle DF; pt grimacing and pulling away due to pain.    General Comments General comments (skin integrity, edema, etc.): pt only willing to minimally participate in bed level therapeutic exercises and PROM of L LE      Pertinent Vitals/Pain Pain Assessment: Faces Faces Pain Scale: Hurts even more Pain Location: L hip  Pain Descriptors / Indicators: Discomfort;Grimacing;Guarding Pain Intervention(s): Monitored during session    Home Living                      Prior Function            PT Goals (current goals can now be found in the care plan section) Acute Rehab PT Goals PT Goal Formulation: With patient Time For Goal Achievement: 04/04/17 Potential to Achieve  Goals: Fair Progress towards PT goals: Not progressing toward goals - comment (minimal participation this session)    Frequency    Min 2X/week      PT Plan Current plan remains appropriate    Co-evaluation              AM-PAC PT "6 Clicks" Daily Activity  Outcome Measure  Difficulty turning over in bed (including adjusting bedclothes, sheets and blankets)?: Total Difficulty moving from lying on back to sitting on the side of the bed? : Total Difficulty sitting down on and standing up from a chair with arms (e.g., wheelchair, bedside commode, etc,.)?: Total Help needed moving to and from a bed to chair (including a wheelchair)?: Total Help needed walking in hospital room?: Total Help needed climbing 3-5 steps with a railing? : Total 6 Click  Score: 6    End of Session Equipment Utilized During Treatment: Oxygen (28% trach collar) Activity Tolerance: Patient limited by pain Patient left: in bed;with call bell/phone within reach;with bed alarm set Nurse Communication: Mobility status;Need for lift equipment PT Visit Diagnosis: Muscle weakness (generalized) (M62.81)     Time: 7579-7282 PT Time Calculation (min) (ACUTE ONLY): 10 min  Charges:  $Therapeutic Exercise: 8-22 mins                    G Codes:       [name], [name], [name] [phone]    [name] 03/30/2017, 1:58 PM

## 2017-03-31 ENCOUNTER — Other Ambulatory Visit: Payer: Self-pay

## 2017-03-31 LAB — POCT INR: INR: 2.9 — AB (ref 0.9–1.1)

## 2017-03-31 LAB — PROTIME-INR: Protime: 30.2 — AB (ref 10.0–13.8)

## 2017-03-31 MED ORDER — OXYCODONE-ACETAMINOPHEN 5-325 MG PO TABS: ORAL_TABLET | ORAL | 0 refills | Status: DC

## 2017-03-31 NOTE — Telephone Encounter (Signed)
RX faxed to [facility] @ [phone], phone number [phone] 

## 2017-04-02 ENCOUNTER — Emergency Department (HOSPITAL_COMMUNITY)
Admission: EM | Admit: 2017-04-02 | Discharge: 2017-04-03 | Disposition: A | Discharge status: Home / Self Care | Payer: Medicare Other | Attending: Emergency Medicine | Admitting: Emergency Medicine

## 2017-04-02 ENCOUNTER — Encounter (HOSPITAL_COMMUNITY): Payer: Self-pay | Admitting: Emergency Medicine

## 2017-04-02 DIAGNOSIS — J9611 Chronic respiratory failure with hypoxia: Secondary | ICD-10-CM | POA: Diagnosis not present

## 2017-04-02 DIAGNOSIS — J9612 Chronic respiratory failure with hypercapnia: Secondary | ICD-10-CM | POA: Diagnosis not present

## 2017-04-02 DIAGNOSIS — Z87891 Personal history of nicotine dependence: Secondary | ICD-10-CM | POA: Insufficient documentation

## 2017-04-02 DIAGNOSIS — I110 Hypertensive heart disease with heart failure: Secondary | ICD-10-CM | POA: Insufficient documentation

## 2017-04-02 DIAGNOSIS — I5021 Acute systolic (congestive) heart failure: Secondary | ICD-10-CM | POA: Insufficient documentation

## 2017-04-02 DIAGNOSIS — E119 Type 2 diabetes mellitus without complications: Secondary | ICD-10-CM | POA: Diagnosis not present

## 2017-04-02 DIAGNOSIS — Z9861 Coronary angioplasty status: Secondary | ICD-10-CM | POA: Insufficient documentation

## 2017-04-02 DIAGNOSIS — I2510 Atherosclerotic heart disease of native coronary artery without angina pectoris: Secondary | ICD-10-CM | POA: Diagnosis not present

## 2017-04-02 DIAGNOSIS — Z7982 Long term (current) use of aspirin: Secondary | ICD-10-CM | POA: Insufficient documentation

## 2017-04-02 DIAGNOSIS — J449 Chronic obstructive pulmonary disease, unspecified: Secondary | ICD-10-CM | POA: Insufficient documentation

## 2017-04-02 DIAGNOSIS — Z79899 Other long term (current) drug therapy: Secondary | ICD-10-CM | POA: Insufficient documentation

## 2017-04-02 DIAGNOSIS — J9620 Acute and chronic respiratory failure, unspecified whether with hypoxia or hypercapnia: Secondary | ICD-10-CM | POA: Diagnosis not present

## 2017-04-02 DIAGNOSIS — Z7901 Long term (current) use of anticoagulants: Secondary | ICD-10-CM | POA: Insufficient documentation

## 2017-04-02 DIAGNOSIS — Z96642 Presence of left artificial hip joint: Secondary | ICD-10-CM | POA: Insufficient documentation

## 2017-04-02 LAB — I-STAT ARTERIAL BLOOD GAS, ED
Acid-Base Excess: 7 mmol/L — ABNORMAL HIGH (ref 0.0–2.0)
Bicarbonate: 29.7 mmol/L — ABNORMAL HIGH (ref 20.0–28.0)
O2 Saturation: 91 %
TCO2: 31 mmol/L (ref 0–100)
pCO2 arterial: 35.3 mmHg (ref 32.0–48.0)
pH, Arterial: 7.533 — ABNORMAL HIGH (ref 7.350–7.450)
pO2, Arterial: 53 mmHg — ABNORMAL LOW (ref 83.0–108.0)

## 2017-04-02 MED ORDER — LIDOCAINE-EPINEPHRINE (PF) 2 %-1:200000 IJ SOLN
10.0000 mL | Freq: Once | INTRAMUSCULAR | Status: AC
  Administered 2017-04-03: 10 mL
  Filled 2017-04-02: qty 20

## 2017-04-02 NOTE — Consult Note (Signed)
Reason for Consult:Replace trach that dislodged Referring Physician: ER  Curtis Keller is an 61 y.o. male.  HPI: Patient had a trach placed 5 months ago. He's subsequently receiving care in a nursing home. He was transferred to the ER when trach was noted out. Patient has had no significant respiratory problems.REspiratory therapy was unable to replace the trach at bedside and I was consulted.   Past Medical History:  Diagnosis Date  . Acute respiratory failure (HCC)    a. Spring 2017 following fall/splenectomy -->admitted to [facility]-->trach/g tube.  . Anemia    a. 12/2015 ABL in setting of fall/hematomas/splenic laceration req splenectomy.  Marland Kitchen Apical mural thrombus    a. 11/2015 Echo: EF 25-30%, moderate size apical thrombus-->coumadin;  b. 12/2015 f/u Echo: EF 25-30%, ant/septa HK, no obvious large thrombus but cannot exclude small mural thrombus.  Marland Kitchen CAD (coronary artery disease)    a. 12/2014 s/p BMS to the RCA.  Marland Kitchen Cerebral infarction (HCC)   . Chronic systolic CHF (congestive heart failure) (HCC)    a. 12/2015 Echo: EF 25-30%.  . Dementia   . Depression   . Dysphagia   . Falls    a. 11/2015 traumatic fall with resultant trauma req splenectomy and prolonged hospitalization complicated by resp failure.  Marland Kitchen History of pneumonia   . Hyperlipidemia   . Hypertension   . Ischemic cardiomyopathy    a. 12/2015 Echo: EF 25-30%, anterior and septal HK.  Marland Kitchen Left hemiplegia (HCC)   . Protein calorie malnutrition (HCC)   . Respiratory failure (HCC)   . Spleen injury   . Status post tracheostomy (HCC)    a. 01/2016 in setting of ongoing resp failure and aspiration.    Past Surgical History:  Procedure Laterality Date  . CARDIAC CATHETERIZATION    . CORONARY ANGIOPLASTY WITH STENT PLACEMENT    . HIP ARTHROPLASTY Left 02/10/2017   Procedure: LEFT HIP HEMIARTHROPLASTY;  Surgeon: [name], MD;  Location: [facility] OR;  Service: Orthopedics;  Laterality: Left;  . IR GASTROSTOMY TUBE MOD  SED  02/02/2017  . IR GENERIC HISTORICAL  07/22/2016   IR GASTROSTOMY TUBE REMOVAL 07/22/2016 [name], MD [facility]-INTERV RAD  . PR LAP, SPLENECTOMY  01/07/2016  . TRACHEOSTOMY TUBE PLACEMENT N/A 02/22/2016   Procedure: TRACHEOSTOMY;  Surgeon: [name], MD;  Location: [facility] OR;  Service: ENT;  Laterality: N/A;  . TRACHEOSTOMY TUBE PLACEMENT N/A 01/14/2017   Procedure: TRACHEOSTOMY;  Surgeon: [name], MD;  Location[facility] OR;  Service: ENT;  Laterality: N/A;  inserted #6DCT QIH#[id number]    Social History:  reports that he has quit smoking. His smoking use included Cigarettes. He has a 76.00 pack-year smoking history. He has never used smokeless tobacco. He reports that he does not drink alcohol or use drugs.  Allergies:  Allergies  Allergen Reactions  . Codeine Anaphylaxis and Nausea And Vomiting  . Lisinopril Itching and Rash  . Hydrocodone Other (See Comments)    On MAR  . Hydrocodone-Acetaminophen Nausea And Vomiting    Cannot take any acetaminophen due to liver issues per sister  . Tegaderm Ag Mesh [Silver] Other (See Comments)    On MAR  . Tylenol [Acetaminophen] Nausea Only and Other (See Comments)    Cannot take any acetaminophen due to liver issues per sister    Medications: I have reviewed the patient's current medications.  Results for orders placed or performed during the hospital encounter of 04/02/17 (from the past 48 hour(s))  I-Stat  arterial blood gas, ED     Status: Abnormal   Collection Time: 04/02/17 10:17 PM  Result Value Ref Range   pH, Arterial 7.533 (H) 7.350 - 7.450   pCO2 arterial 35.3 32.0 - 48.0 mmHg   pO2, Arterial 53.0 (L) 83.0 - 108.0 mmHg   Bicarbonate 29.7 (H) 20.0 - 28.0 mmol/L   TCO2 31 0 - 100 mmol/L   O2 Saturation 91.0 %   Acid-Base Excess 7.0 (H) 0.0 - 2.0 mmol/L   Patient temperature 98.6 F    Collection site RADIAL, ALLEN'S TEST ACCEPTABLE    Drawn by RT    Sample type ARTERIAL     No results found.  ZOX:WRUE not know  when the trach came out.   AV:WUJWJ and confused with sats > 90. No respiratory difficulty or stridor noted. A # 4 cuffless shiley trach was placed through a small residual tracheostoma. He had good breath sounds through the trach with minimal bleeding. This was secured with 3.0 silk sutures x 4 and a Velcro trach collar.  Assessment/Plan: Dislodged trach.  Replaced with a #4 cuffless shiley trach. Sutures should be removed in 7-10 days Consider pulmonary consult to see if trach needed.  CHRISTOPHER NEWMAN 04/02/2017, 11:09 PM

## 2017-04-02 NOTE — Discharge Instructions (Signed)
We replaced the trach tube with 4-0 shyley.  We need to reassess need for tracheostomy - so please see the pulmonologist in 7-10 days

## 2017-04-02 NOTE — ED Notes (Signed)
RT currently looking for appropriate sized trach for patient

## 2017-04-02 NOTE — ED Notes (Signed)
RN sent A11 form to materials for new trachs at this time

## 2017-04-02 NOTE — ED Triage Notes (Signed)
[facility]. Ems states within the past hour, staff states pts trach became displaced. Pts resp e/u, 96% on room air, denies sob. Pt a/o.

## 2017-04-02 NOTE — ED Notes (Signed)
ED Provider at bedside. 

## 2017-04-02 NOTE — ED Notes (Signed)
ENT provider cart and suture cart at bedside

## 2017-04-02 NOTE — ED Provider Notes (Signed)
[facility]-EMERGENCY DEPT Provider Note   CSN: [id number] Arrival date & time: 04/02/17  1635     History   Chief Complaint Chief Complaint  Patient presents with  . Tracheostomy Tube Change    HPI TADARIUS MALAND is a 61 y.o. male.  HPI 61 y/o M with hx of chronic resp failure, CHF, COPD comes in from his nursing home due to trach malfunction. Pt's sister reports that the trach was in place last night, and she noticed that it had slipped out today. Pt has no dib, cough, chest pain.  Past Medical History:  Diagnosis Date  . Acute respiratory failure (HCC)    a. Spring 2017 following fall/splenectomy -->admitted to [facility]-->trach/g tube.  . Anemia    a. 12/2015 ABL in setting of fall/hematomas/splenic laceration req splenectomy.  Marland Kitchen Apical mural thrombus    a. 11/2015 Echo: EF 25-30%, moderate size apical thrombus-->coumadin;  b. 12/2015 f/u Echo: EF 25-30%, ant/septa HK, no obvious large thrombus but cannot exclude small mural thrombus.  Marland Kitchen CAD (coronary artery disease)    a. 12/2014 s/p BMS to the RCA.  Marland Kitchen Cerebral infarction (HCC)   . Chronic systolic CHF (congestive heart failure) (HCC)    a. 12/2015 Echo: EF 25-30%.  . Dementia   . Depression   . Dysphagia   . Falls    a. 11/2015 traumatic fall with resultant trauma req splenectomy and prolonged hospitalization complicated by resp failure.  Marland Kitchen History of pneumonia   . Hyperlipidemia   . Hypertension   . Ischemic cardiomyopathy    a. 12/2015 Echo: EF 25-30%, anterior and septal HK.  Marland Kitchen Left hemiplegia (HCC)   . Protein calorie malnutrition (HCC)   . Respiratory failure (HCC)   . Spleen injury   . Status post tracheostomy (HCC)    a. 01/2016 in setting of ongoing resp failure and aspiration.    Patient Active Problem List   Diagnosis Date Noted  . Acute on chronic respiratory failure with hypoxia (HCC)   . Tracheostomy status (HCC)   . SOB (shortness of breath)   . Hypoxia   . Pressure injury of skin  03/20/2017  . HCAP (healthcare-associated pneumonia) 03/18/2017  . Pneumonia 03/17/2017  . 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 Depression with anxiety 01/23/2017  . Encounter for hospice care discussion   . DNR (do not resuscitate)   . Ischemic cardiomyopathy   . Goals of care, counseling/discussion   . Palliative care encounter   . Acute systolic congestive heart failure (HCC)   . Acute and chronic respiratory failure with hypoxia (HCC) 11/28/2016  . Hip fracture (HCC) 11/27/2016  . Closed nondisplaced intertrochanteric fracture of left femur (HCC) 11/27/2016  . Chronic respiratory failure (HCC) 11/27/2016  . Fall at nursing home 07/17/2016  . Diabetes mellitus, type II, insulin dependent (HCC) 06/12/2016  . GERD (gastroesophageal reflux disease) 05/07/2016  . COPD (chronic obstructive pulmonary disease) (HCC) 04/18/2016  . Chronic anticoagulation 03/22/2016  . S/P splenectomy 03/13/2016  . PEG (percutaneous endoscopic gastrostomy) status (HCC) 03/13/2016  . MRSA (methicillin resistant Staphylococcus aureus) septicemia (HCC) 03/13/2016  . Aspiration pneumonia (HCC) 03/13/2016  . Dysphagia 03/13/2016  . Acute blood loss anemia 03/13/2016  . Hyperlipidemia 03/13/2016  . Heart failure, chronic systolic (HCC)   . Hypertension   . Depression   . Status post tracheostomy (HCC)   . Cardiomyopathy (HCC)   . Apical mural thrombus   . Protein calorie malnutrition (HCC)   .  Intraparenchymal hematoma of brain due to trauma [facility])     Past Surgical History:  Procedure Laterality Date  . CARDIAC CATHETERIZATION    . CORONARY ANGIOPLASTY WITH STENT PLACEMENT    . HIP ARTHROPLASTY Left 02/10/2017   Procedure: LEFT HIP HEMIARTHROPLASTY;  Surgeon: [name], MD;  Location: [facility] OR;  Service: Orthopedics;  Laterality: Left;  . IR GASTROSTOMY TUBE MOD SED  02/02/2017  . IR GENERIC HISTORICAL  07/22/2016   IR GASTROSTOMY TUBE REMOVAL 07/22/2016 [name], MD  [facility]-INTERV RAD  . PR LAP, SPLENECTOMY  01/07/2016  . TRACHEOSTOMY TUBE PLACEMENT N/A 02/22/2016   Procedure: TRACHEOSTOMY;  Surgeon: [name], MD;  Location: [facility] OR;  Service: ENT;  Laterality: N/A;  . TRACHEOSTOMY TUBE PLACEMENT N/A 01/14/2017   Procedure: TRACHEOSTOMY;  Surgeon: [name], MD;  Location: [facility] OR;  Service: ENT;  Laterality: N/A;  inserted #6DCT ZOX#[id number]       Home Medications    Prior to Admission medications   Medication Sig Start Date End Date Taking? Authorizing Provider  Amino Acids-Protein Hydrolys (FEEDING SUPPLEMENT, PRO-STAT SUGAR FREE 64,) LIQD Place 60 mLs into feeding tube 2 (two) times daily.     [provider]  aspirin 81 MG tablet Place 81 mg into feeding tube daily.    [provider]  atorvastatin (LIPITOR) 40 MG tablet Place 40 mg into feeding tube at bedtime. for Lipids    [provider]  bacitracin 500 UNIT/GM ointment Apply 1 application topically See admin instructions. Apply to left hip topically every day shift for wound care - cover with dry dressing    [provider]  cyanocobalamin 500 MCG tablet Place 1,000 mcg into feeding tube daily.    [provider]  famotidine (PEPCID) 20 MG tablet Place 20 mg into feeding tube 2 (two) times daily. for GERD    [provider]  FLUoxetine (PROZAC) 20 MG capsule Place 60 mg into feeding tube daily.    [provider]  folic acid (FOLVITE) 1 MG tablet Place 1 mg into feeding tube daily.     [provider]  guaiFENesin 200 MG tablet Place 200 mg into feeding tube 3 (three) times daily. for CHF    [provider]  lidocaine (LIDODERM) 5 % Place 1 patch onto the skin daily. Remove & Discard patch within 12 hours or as directed by MD 12/13/16   Leroy Sea, MD  metoCLOPramide (REGLAN) 5 MG tablet Place 5 mg into feeding tube See admin instructions. Ordered 03/16/17 - 1 tablet (5 mg) per tube every 8 hours  for GERD for 3 days 03/17/17 03/19/17  [provider]  metoprolol tartrate (LOPRESSOR) 25 MG tablet Place 25 mg into feeding tube 2 (two) times daily.    [provider]  Multiple Vitamin (MULTIVITAMIN WITH MINERALS) TABS tablet Place 1 tablet into feeding tube daily.    [provider]  Nutritional Supplements (NUTRITIONAL SUPPLEMENT PO) Place into feeding tube See admin instructions. nephro 1.8 per PEG tube at 55 ml/hr continuous every shift for diet    [provider]  OLANZapine (ZYPREXA) 5 MG tablet Place 5 mg into feeding tube 2 (two) times daily. Give 1 tablet via G-tube two times daily     [provider]  Omega-3 Fatty Acids (FISH OIL) 1000 MG CAPS Place 1,000 mg into feeding tube daily.    [provider]  oxyCODONE-acetaminophen (PERCOCET/ROXICET) 5-325 MG tablet Give 1 tablet via G-tube every  8 hours for pain 03/31/17   Reed, Tiffany L, DO  OXYGEN See admin instructions. Oxygen at 28% via trach cuff every shift    [provider]  polyethylene glycol (MIRALAX / GLYCOLAX) packet Place 17 g into feeding tube daily. for constipation    [provider]  senna (SENOKOT) 8.6 MG TABS tablet Place 1 tablet into feeding tube 2 (two) times daily. For constipation    [provider]  tamsulosin (FLOMAX) 0.4 MG CAPS capsule 0.4 mg every evening.     [provider]  Valproate Sodium (DEPAKENE) 250 MG/5ML SOLN solution Place 250 mg into feeding tube 3 (three) times daily.     [provider]  warfarin (COUMADIN) 6 MG tablet Place 6 mg into feeding tube every evening.     [provider]  Water For Irrigation, Sterile (FREE WATER) SOLN Place 150 mLs into feeding tube every 4 (four) hours. 03/30/17   Richarda Overlie, MD    Family History Family History  Problem Relation Age of Onset  . Alcohol abuse Mother   . Hypertension Mother   . Coronary artery disease Mother   . Alcohol abuse Father   .  Hypertension Father   . Coronary artery disease Father   . Alcohol abuse Maternal Uncle     Social History Social History  Substance Use Topics  . Smoking status: Former Smoker    Packs/day: 2.00    Years: 38.00    Types: Cigarettes  . Smokeless tobacco: Never Used     Comment: quit spring of 2017.  Marland Kitchen Alcohol use No     Allergies   Codeine; Lisinopril; Hydrocodone; Hydrocodone-acetaminophen; Tegaderm ag mesh [silver]; and Tylenol [acetaminophen]   Review of Systems Review of Systems  Constitutional: Negative for activity change.  Respiratory: Negative for shortness of breath.   Cardiovascular: Negative for chest pain.  Neurological: Negative for dizziness.     Physical Exam Updated Vital Signs BP 107/66   Pulse (!) 105   Temp 97.8 F (36.6 C) (Oral)   Resp 20   SpO2 97%   Physical Exam  Constitutional: He appears well-developed.  HENT:  Head: Atraumatic.  Neck: Neck supple.  Trach wound appreciated w/o signs of infection / bleeding  Cardiovascular: Normal rate.   Pulmonary/Chest: Effort normal. No respiratory distress.  Neurological: He is alert.  Skin: Skin is warm.  Nursing note and vitals reviewed.    ED Treatments / Results  Labs (all labs ordered are listed, but only abnormal results are displayed) Labs Reviewed  I-STAT ARTERIAL BLOOD GAS, ED - Abnormal; Notable for the following:       Result Value   pH, Arterial 7.533 (*)    pO2, Arterial 53.0 (*)    Bicarbonate 29.7 (*)    Acid-Base Excess 7.0 (*)    All other components within normal limits  BLOOD GAS, ARTERIAL    EKG  EKG Interpretation None       Radiology No results found.  Procedures Procedures (including critical care time)  Medications Ordered in ED Medications  lidocaine-EPINEPHrine (XYLOCAINE W/EPI) 2 %-1:200000 (PF) injection 10 mL (not administered)     Initial Impression / Assessment and Plan / ED Course  I have reviewed the triage vital signs and the nursing  notes.  Pertinent labs & imaging results that were available during my care of the patient were reviewed by me and considered in my medical decision making (see chart for details).     Pt comes in  with cc of trach falling off. RT attempted placing the 6-0 shyley that was in place per OR note from FEB, and they failed. Called Dr. Ezzard Standing, ENT, and he was able to replace the trach. Dr. Ezzard Standing suspects that the trach was out for a long while as he had difficulty getting it placed. Pt has not seen anyone since the discharge in regards to the trach - so we will request f/u with Pulm CCM. ABG here showed no hypercapnia, it was completed prior to trach replacement.  Final Clinical Impressions(s) / ED Diagnoses   Final diagnoses:  Chronic respiratory failure with hypoxia and hypercapnia (HCC)    New Prescriptions New Prescriptions   No medications on file     Derwood Kaplan, MD 04/03/17 0005

## 2017-04-02 NOTE — Progress Notes (Signed)
RT Forrestine Him and Merdis Delay attempted to place trache but due to airway size , unable to place new trache. Pt. Is currently sating 95% on RA and RT will place pt. On 02 with humidity.

## 2017-04-03 ENCOUNTER — Non-Acute Institutional Stay (SKILLED_NURSING_FACILITY): Payer: Medicare Other | Admitting: Adult Health

## 2017-04-03 ENCOUNTER — Encounter: Payer: Self-pay | Admitting: Adult Health

## 2017-04-03 ENCOUNTER — Other Ambulatory Visit: Payer: Self-pay | Admitting: *Deleted

## 2017-04-03 DIAGNOSIS — I513 Intracardiac thrombosis, not elsewhere classified: Secondary | ICD-10-CM | POA: Diagnosis not present

## 2017-04-03 DIAGNOSIS — S72145K Nondisplaced intertrochanteric fracture of left femur, subsequent encounter for closed fracture with nonunion: Secondary | ICD-10-CM

## 2017-04-03 DIAGNOSIS — I2109 ST elevation (STEMI) myocardial infarction involving other coronary artery of anterior wall: Secondary | ICD-10-CM | POA: Diagnosis not present

## 2017-04-03 DIAGNOSIS — I5021 Acute systolic (congestive) heart failure: Secondary | ICD-10-CM

## 2017-04-03 DIAGNOSIS — R1312 Dysphagia, oropharyngeal phase: Secondary | ICD-10-CM | POA: Diagnosis not present

## 2017-04-03 DIAGNOSIS — J9621 Acute and chronic respiratory failure with hypoxia: Secondary | ICD-10-CM | POA: Diagnosis not present

## 2017-04-03 DIAGNOSIS — J411 Mucopurulent chronic bronchitis: Secondary | ICD-10-CM | POA: Diagnosis not present

## 2017-04-03 DIAGNOSIS — G8114 Spastic hemiplegia affecting left nondominant side: Secondary | ICD-10-CM | POA: Diagnosis not present

## 2017-04-03 MED ORDER — OXYCODONE-ACETAMINOPHEN 5-325 MG PO TABS: ORAL_TABLET | ORAL | 0 refills | Status: DC

## 2017-04-03 NOTE — Progress Notes (Signed)
Location:   [facility] Room Number: 144 A Place of Service:  SNF (31)   CODE STATUS: Full Code  Allergies  Allergen Reactions  . Codeine Anaphylaxis and Nausea And Vomiting  . Lisinopril Itching and Rash  . Hydrocodone Other (See Comments)    On MAR  . Hydrocodone-Acetaminophen Nausea And Vomiting    Cannot take any acetaminophen due to liver issues per sister  . Tegaderm Ag Mesh [Silver] Other (See Comments)    On MAR  . Tylenol [Acetaminophen] Nausea Only and Other (See Comments)    Cannot take any acetaminophen due to liver issues per sister    Chief Complaint  Patient presents with  . Hospitalization Follow-up    Hospital follow up    HPI:  He has been hospitalized for aspiration pneumonia with acute on chronic respiratory failure and acute on chronic systolic heart failure. He is unable to fully participate in the hpi or ros. He is here for short term rehab. more than likely this does represent a long term placement for him.   Past Medical History:  Diagnosis Date  . Acute respiratory failure (HCC)    a. Spring 2017 following fall/splenectomy -->admitted to [facility]-->trach/g tube.  . Anemia    a. 12/2015 ABL in setting of fall/hematomas/splenic laceration req splenectomy.  Marland Kitchen Apical mural thrombus    a. 11/2015 Echo: EF 25-30%, moderate size apical thrombus-->coumadin;  b. 12/2015 f/u Echo: EF 25-30%, ant/septa HK, no obvious large thrombus but cannot exclude small mural thrombus.  Marland Kitchen CAD (coronary artery disease)    a. 12/2014 s/p BMS to the RCA.  Marland Kitchen Cerebral infarction (HCC)   . Chronic systolic CHF (congestive heart failure) (HCC)    a. 12/2015 Echo: EF 25-30%.  . Dementia   . Depression   . Dysphagia   . Falls    a. 11/2015 traumatic fall with resultant trauma req splenectomy and prolonged hospitalization complicated by resp failure.  Marland Kitchen History of pneumonia   . Hyperlipidemia   . Hypertension   . Ischemic cardiomyopathy    a. 12/2015  Echo: EF 25-30%, anterior and septal HK.  Marland Kitchen Left hemiplegia (HCC)   . Protein calorie malnutrition (HCC)   . Respiratory failure (HCC)   . Spleen injury   . Status post tracheostomy (HCC)    a. 01/2016 in setting of ongoing resp failure and aspiration.    Past Surgical History:  Procedure Laterality Date  . CARDIAC CATHETERIZATION    . CORONARY ANGIOPLASTY WITH STENT PLACEMENT    . HIP ARTHROPLASTY Left 02/10/2017   Procedure: LEFT HIP HEMIARTHROPLASTY;  Surgeon: [name], MD;  Location: [facility] OR;  Service: Orthopedics;  Laterality: Left;  . IR GASTROSTOMY TUBE MOD SED  02/02/2017  . IR GENERIC HISTORICAL  07/22/2016   IR GASTROSTOMY TUBE REMOVAL 07/22/2016 [name], MD [facility]-INTERV RAD  . PR LAP, SPLENECTOMY  01/07/2016  . TRACHEOSTOMY TUBE PLACEMENT N/A 02/22/2016   Procedure: TRACHEOSTOMY;  Surgeon: [name], MD;  Location[facility] OR;  Service: ENT;  Laterality: N/A;  . TRACHEOSTOMY TUBE PLACEMENT N/A 01/14/2017   Procedure: TRACHEOSTOMY;  Surgeon: [name], MD;  Location: [facility] OR;  Service: ENT;  Laterality: N/A;  inserted #6DCT YQM#[id number]    Social History   Social History  . Marital status: Single    Spouse name: N/A  . Number of children: N/A  . Years of education: N/A   Occupational History  . Not on file.   Social  History Main Topics  . Smoking status: Former Smoker    Packs/day: 2.00    Years: 38.00    Types: Cigarettes  . Smokeless tobacco: Never Used     Comment: quit spring of 2017.  Marland Kitchen Alcohol use No  . Drug use: No  . Sexual activity: Not on file   Other Topics Concern  . Not on file   Social History Narrative   Lives @ SNF since Spring 2017.  Sedentary.  Unsteady on his feet.  Falls about 1x/wk.   Family History  Problem Relation Age of Onset  . Alcohol abuse Mother   . Hypertension Mother   . Coronary artery disease Mother   . Alcohol abuse Father   . Hypertension Father   . Coronary artery disease Father   . Alcohol abuse  Maternal Uncle       VITAL SIGNS BP 105/67   Pulse 86   Temp 99.1 F (37.3 C)   Resp (!) 21   Ht  (1.803 m)   Wt 204 lb (92.5 kg)   SpO2 94%   BMI 28.45 kg/m   Patient's Medications  New Prescriptions   No medications on file  Previous Medications   AMINO ACIDS-PROTEIN HYDROLYS (FEEDING SUPPLEMENT, PRO-STAT SUGAR FREE 64,) LIQD    Place 60 mLs into feeding tube 2 (two) times daily.    ATORVASTATIN (LIPITOR) 40 MG TABLET    Place 40 mg into feeding tube at bedtime. for Lipids   BACITRACIN 500 UNIT/GM OINTMENT    Apply 1 application topically See admin instructions. Apply to left hip topically every day shift for wound care - cover with dry dressing   CYANOCOBALAMIN 500 MCG TABLET    Place 1,000 mcg into feeding tube daily.   FAMOTIDINE (PEPCID) 20 MG TABLET    Place 20 mg into feeding tube 2 (two) times daily. for GERD   FOLIC ACID (FOLVITE) 1 MG TABLET    Place 1 mg into feeding tube daily.    GUAIFENESIN 200 MG TABLET    Place 200 mg into feeding tube 3 (three) times daily. for CHF   LIDOCAINE (LIDODERM) 5 %    Place 1 patch onto the skin daily. Remove & Discard patch within 12 hours or as directed by MD   METOCLOPRAMIDE (REGLAN) 5 MG TABLET    Place 5 mg into feeding tube See admin instructions. Ordered 03/16/17 - 1 tablet (5 mg) per tube every 8 hours for GERD for 3 days   METOPROLOL TARTRATE (LOPRESSOR) 25 MG TABLET    Place 25 mg into feeding tube 2 (two) times daily.   MULTIPLE VITAMIN (MULTIVITAMIN WITH MINERALS) TABS TABLET    Place 1 tablet into feeding tube daily.   NUTRITIONAL SUPPLEMENTS (FEEDING SUPPLEMENT, OSMOLITE 1.5 CAL,) LIQD    Give 62cc/hr continuous via PEG-Tube every shift   OLANZAPINE (ZYPREXA) 5 MG TABLET    Place 5 mg into feeding tube 2 (two) times daily. Give 1 tablet via G-tube two times daily    OMEGA-3 FATTY ACIDS (FISH OIL) 1000 MG CAPS    Place 1,000 mg into feeding tube daily.   OXYCODONE-ACETAMINOPHEN (PERCOCET/ROXICET) 5-325 MG TABLET    Give  1 tablet via G-tube every 8 hours for pain   OXYGEN    See admin instructions. Oxygen at 28% via trach cuff every shift   POLYETHYLENE GLYCOL (MIRALAX / GLYCOLAX) PACKET    Place 17 g into feeding tube daily. for constipation   SENNA (SENOKOT) 8.6  MG TABS TABLET    Place 1 tablet into feeding tube 2 (two) times daily. For constipation   TAMSULOSIN (FLOMAX) 0.4 MG CAPS CAPSULE    Give 1 capsule via PEG-Tube one time daily   UNABLE TO FIND    HSG NPO (Nothing by mouth). Resident is NPO   WARFARIN (COUMADIN) 6 MG TABLET    Place 6 mg into feeding tube every evening.    WATER FOR IRRIGATION, STERILE (FREE WATER) SOLN    Place 150 mLs into feeding tube every 4 (four) hours.  Modified Medications   No medications on file  Discontinued Medications   ASPIRIN 81 MG TABLET    Place 81 mg into feeding tube daily.   FLUOXETINE (PROZAC) 20 MG CAPSULE    Place 60 mg into feeding tube daily.   NUTRITIONAL SUPPLEMENTS (NUTRITIONAL SUPPLEMENT PO)    Place into feeding tube See admin instructions. nephro 1.8 per PEG tube at 55 ml/hr continuous every shift for diet   VALPROATE SODIUM (DEPAKENE) 250 MG/5ML SOLN SOLUTION    Place 250 mg into feeding tube 3 (three) times daily.      SIGNIFICANT DIAGNOSTIC EXAMS  07-06-16: 2-d echo: 1. technically difficult; 2. Normal left ventricular systolic function EF 55-60%; 3. Mild relaxation abnormality 4. Mild concentric left ventricular hypertrophy 5. Mild sclerotic aortic valve no significant stenosis 6. Trace tricuspid regurgitation . 7. Cannot rule out thrombus secondary to poor quality   07-11-16: chest x-ray; no acute cardiopulmonary process  11-28-16: 2-d echo: - Very limited image quality, however LVEF is severely impaired, estimated at 25-30% with akinesis of all of the apical segments and mid anteroseptal, anterior and anterolateral segments. There is no thrombus on the contrast images.  03-06-17: chest x-ray: 1. Improved airspace opacity in the left mid lung. 2.  Cardiomegaly with indistinct pulmonary vasculature and interstitial accentuation which could reflect mild interstitial edema. 3. Old healed fractures bilaterally. 4. Tracheostomy tube noted, slightly exaggerated angulation between the tube in the trachea although this could be caused by kyphosis.   03-29-17: ct of chest: Aortic atherosclerosis and coronary arterial calcifications aneurysmal dilatation of the ascending thoracic aorta 4.5 cm transverse, recommendation below.  Mural calcification at cardiac apex question prior MI. Bronchitic changes with scattered chronic peripheral interstitial infiltrates in both lungs and improved bibasilar atelectasis. No definite acute infiltrate. Stable 9 mm RIGHT upper lobe nodule. Upper normal sized mediastinal lymph nodes without definite thoracic adenopathy.  04-02-17: left hip and pelvic x-ray: no acute osseous abnormality. Status post femoral prosthesis. No complicating features   04-02-17: left shoulder: no acute osseous abnormality. Degenerative changes.      LABS REVIEWED:   05-18-16: INR 2.7  05-24-16: chol 105; ldl 40; trig 86; hdl 47 urine micro-albumin 9.7 06-21-16: INR 2.3   07-19-16: wbc 8.5; hgb 12.5; hct 40.4; mcv 98.8; plt 233; glucose 70; bun 29.5; creat 1.27; k+ 4.2; na++ 141; INR 2.9  08-21-16: INR 4.2  10-05-16: tsh 2.62; vit B 12: 264; folate 17.8  03-17-17: wbc 14.6 hgb 11.8; hct 36.9; mcv 101.4; plt 223; glucose 130; bun 48; creat 2.04; k+ 3.6; na++ 139; ca 10.5; BNP 601.2; blood cultures; no growth 03-20-17: wbc 13.2; hgb 9.7; hct 31.7; mcv 102.6; plt 280; glucose 151; bun 46; creat 1.83; k+ 3.3; na++ 142; ca 9.5; ammonia 37 03-22-17: wbc 11.3; hgb 10.0; hct 32.1; mcv 104.6; plt 312; glucose 120 bun 53; creat 1.78; k+ 3.9; na++ 142; ca 9.5; hgb a1c 5.3 03-29-17: wbc 11.1; hgb 10.4;  hct 33.9; mcv 103.0; plt 319; glucose 160; bun 82; creat 2.26; k+ 3.7; na++ 145; liver normal albumin 2.2 03-30-17: glucose 128; bun 78; creat 2.18; k+ 3.9; na++  144; ca 9.4  04-03-17: INR 2.8     Review of Systems  Unable to perform ROS: other  He is having left knee pain   Physical Exam  Constitutional: No distress.  Eyes: Conjunctivae are normal.  Neck: Neck supple. No JVD present. No thyromegaly present.  Cardiovascular: wheezing present 02 6L via collar  has trach  Respiratory: Effort normal and breath sounds normal. No respiratory distress. He has no wheezes.  GI: Soft. Bowel sounds are normal. He exhibits no distension. There is no tenderness.  Has peg tube  Musculoskeletal: He exhibits no edema.  Left hand contracture left knee contracture has splint in place  Lymphadenopathy:    He has no cervical adenopathy.  Neurological: He is alert.  Skin: Skin is warm and dry. He is not diaphoretic.  Psychiatric: He has a normal mood and affect.    ASSESSMENT/ PLAN:  1. Acute on systolic heart failure:  EF25-30% (12-08-16) his diuretic was stopped in the hospital due to his renal failure. Will continue lopressor 25 mg twice daily will monitor   2. Acute on chronic respiratory failure with COPD: is trach dependent 28% trach collar; will continue guaifenesin 200 mg three times daily   3. Apical mural thrombus: is on long term coumadin therapy will continue to monitor   4. Dyslipidemia: will continue lipitor 40 mg daily and fish oil 1 gm daily   5, BPH: will continue flomax 0.4 mg daily  6. Left femoral neck fracture: is status post left hemiarthroplasty: has knee splint in placewill continue lidoderm patch; percocet 5/325 mg every 8 hours   7. Gerd: will continue pepcid 20 mg daily  8. Constipation: will continue senna twice daily and miralax daily   9. Depression with anxiety and psychosis: will continue zyprexa 5 mg twice daily and will restart prozac at 40 mg daily and will monitor his status   10. Stage III renal failure: bun 78; creat 2.18; will monitor   11. Dysphagia: no signs of aspiration present; is npo and is on tube  feeinds for his nutritional support   Will recheck cbc cmp; will repeat chest x-ray in 4 weeks Will do weekly bmp until renal function is stable   Time spent with patient  50  minutes >50% time spent counseling; reviewing medical record; tests; labs; and developing future plan of care   MD is aware of resident's narcotic use and is in agreement with current plan of care. We will attempt to wean resident as apropriate     Synthia Innocent NP [facility]  Contact [phone] Monday through Friday 8am- 5pm  After hours call [phone]

## 2017-04-03 NOTE — Telephone Encounter (Signed)
[facility] #[phone] Fax:[phone]  

## 2017-04-04 ENCOUNTER — Telehealth: Payer: Self-pay | Admitting: Internal Medicine

## 2017-04-04 ENCOUNTER — Non-Acute Institutional Stay (SKILLED_NURSING_FACILITY): Payer: Medicare Other | Admitting: Internal Medicine

## 2017-04-04 ENCOUNTER — Other Ambulatory Visit: Payer: Self-pay | Admitting: *Deleted

## 2017-04-04 ENCOUNTER — Encounter: Payer: Self-pay | Admitting: Internal Medicine

## 2017-04-04 DIAGNOSIS — I513 Intracardiac thrombosis, not elsewhere classified: Secondary | ICD-10-CM

## 2017-04-04 DIAGNOSIS — Z794 Long term (current) use of insulin: Secondary | ICD-10-CM | POA: Diagnosis not present

## 2017-04-04 DIAGNOSIS — J9611 Chronic respiratory failure with hypoxia: Secondary | ICD-10-CM

## 2017-04-04 DIAGNOSIS — I5022 Chronic systolic (congestive) heart failure: Secondary | ICD-10-CM

## 2017-04-04 DIAGNOSIS — Z931 Gastrostomy status: Secondary | ICD-10-CM | POA: Diagnosis not present

## 2017-04-04 DIAGNOSIS — R5381 Other malaise: Secondary | ICD-10-CM | POA: Diagnosis not present

## 2017-04-04 DIAGNOSIS — E119 Type 2 diabetes mellitus without complications: Secondary | ICD-10-CM

## 2017-04-04 DIAGNOSIS — F418 Other specified anxiety disorders: Secondary | ICD-10-CM

## 2017-04-04 DIAGNOSIS — E46 Unspecified protein-calorie malnutrition: Secondary | ICD-10-CM | POA: Diagnosis not present

## 2017-04-04 DIAGNOSIS — I10 Essential (primary) hypertension: Secondary | ICD-10-CM

## 2017-04-04 DIAGNOSIS — F0390 Unspecified dementia without behavioral disturbance: Secondary | ICD-10-CM | POA: Diagnosis not present

## 2017-04-04 DIAGNOSIS — E782 Mixed hyperlipidemia: Secondary | ICD-10-CM | POA: Diagnosis not present

## 2017-04-04 DIAGNOSIS — I2109 ST elevation (STEMI) myocardial infarction involving other coronary artery of anterior wall: Secondary | ICD-10-CM

## 2017-04-04 MED ORDER — OXYCODONE-ACETAMINOPHEN 5-325 MG PO TABS: ORAL_TABLET | ORAL | 0 refills | Status: DC

## 2017-04-04 NOTE — Telephone Encounter (Signed)
JJ- can you look at TP's schedule? No provider has any openings but TP has her blocked spots Thanks

## 2017-04-04 NOTE — Progress Notes (Signed)
Patient ID: Curtis Keller, male   DOB: [birth year], 61 y.o.   MRN: [id number]    HISTORY AND PHYSICAL   DATE: 04/04/2017 Location:    [facility] Room Number: 144 A Place of Service: SNF (31)   Extended Emergency Contact Information Primary Emergency Contact: Dimple Casey [place] of [place] Phone: [phone] Mobile Phone: [phone] Relation: Sister Secondary Emergency Contact: Elease Etienne [place] of [place] Phone: [phone] Relation: Relative  Advanced Directive information Does Patient Have a Medical Advance Directive?: No (Full Code), Would patient like information on creating a medical advance directive?: No - Patient declined  Chief Complaint  Patient presents with  . New Admit To SNF    admission    HPI:  61 yo male seen today as a new admission into SNF following hospital stay for acute/chronic sHF, HCAP, pressure injury of skin,  SOB and hypoxia, acute/chronic respiratory failure, trach status, Peg status, apical mural thrombus, protein calorie malnutrition, COPD, GERD, DM, depression, hx CVA, dementia. He has had a complicated several mos with prolonged hospitalization followed by LTAC. Most recent hospital stay from 03/17/17 - 03/30/17. He completed 8 days of  IV abx (IV vanco/cefepime-->unasyn) for aspiration pneumonia. He rec'd IV diuresis with lasix. 2D echo revealed EF 25-30%. RUE doppler US neg for DVT. No ACEI/ARB due to A/CKD. WBC 14.6K-->11.1K; abs neutrophils 8.2K; Hgb 11.8-->10.4; A1c 5.3%; albumin 2.2; Cr peaked 2.26-->2.18 but did go as low as 1.68 at d/c. He presents to SNF for short term rehab with potential for long term care.  Today he reports no concerns. He was seen in the ED on 6/10th and had trach changed. He fell OOB on 04/01/17 and c/o left shoulder and left hip pain. xrays taken were neg for acute fx. He is a poor historian due to dementia. Hx obtained from chart. He is currently NPO and receives TF via  Peg. He has LUE/LLE contractures from previous CVA  chronic systolic heart failure - reduced EF25-30% (12-08-16) no longer on diuretic 2/2 AKI. Takes lopressor 25 mg twice daily  chronic respiratory failure with COPD - s/p trach and on 28% FiO2 collar; takes guaifenesin 200 mg three times daily. Followed by pulmonary  Apical mural thrombus - takes long term coumadin therapy. INR 2.65. Goal INR 2-3  Dyslipidemia - stable on lipitor 40 mg daily and fish oil 1 gm daily   BPH - stable on flomax 0.4 mg daily  Hx Left femoral neck fracture - he is s/p left hemiarthroplasty 02/10/17; he uses left knee splint; pain stable on lidoderm patch; percocet 5/325 mg every 8 hours   GERD - stable on pepcid 20 mg daily  Constipation - stable on senna twice daily and miralax daily   Depression with anxiety and psychosis - mood stable on zyprexa 5 mg twice daily and  prozac 40 mg daily  CKD - stage 3. Cr 2.18   Dysphagia - due to previous CVA. no signs of aspiration present. He is NPO and is on TF with water flushes via Peg tube for his nutritional support   Protein calorie malnutrition - albumin 2.2. He gets nutritional supplements via TF along with vitamins and minerals.   DM - diet controlled. A1c 5.3%  Past Medical History:  Diagnosis Date  . Acute respiratory failure ([place])    a. Spring 2017 following fall/splenectomy -->admitted to [facility]-->trach/g tube.  . Anemia    a. 12/2015 ABL in setting of fall/hematomas/splenic laceration req splenectomy.  Marland Kitchen  Apical mural thrombus    a. 11/2015 Echo: EF 25-30%, moderate size apical thrombus-->coumadin;  b. 12/2015 f/u Echo: EF 25-30%, ant/septa HK, no obvious large thrombus but cannot exclude small mural thrombus.  Marland Kitchen CAD (coronary artery disease)    a. 12/2014 s/p BMS to the RCA.  Marland Kitchen Cerebral infarction ([place])   . Chronic systolic CHF (congestive heart failure) ([place])    a. 12/2015 Echo: EF 25-30%.  . Dementia   . Depression   . Dysphagia   .  Falls    a. 11/2015 traumatic fall with resultant trauma req splenectomy and prolonged hospitalization complicated by resp failure.  Marland Kitchen History of pneumonia   . Hyperlipidemia   . Hypertension   . Ischemic cardiomyopathy    a. 12/2015 Echo: EF 25-30%, anterior and septal HK.  Marland Kitchen Left hemiplegia ([place])   . Protein calorie malnutrition ([place])   . Respiratory failure ([place])   . Spleen injury   . Status post tracheostomy ([place])    a. 01/2016 in setting of ongoing resp failure and aspiration.    Past Surgical History:  Procedure Laterality Date  . CARDIAC CATHETERIZATION    . CORONARY ANGIOPLASTY WITH STENT PLACEMENT    . HIP ARTHROPLASTY Left 02/10/2017   Procedure: LEFT HIP HEMIARTHROPLASTY;  Surgeon: Newt Minion, MD;  Location: [facility];  Service: Orthopedics;  Laterality: Left;  . IR GASTROSTOMY TUBE MOD SED  02/02/2017  . IR GENERIC HISTORICAL  07/22/2016   IR GASTROSTOMY TUBE REMOVAL 07/22/2016 Sandi Mariscal, MD [facility]-INTERV RAD  . PR LAP, SPLENECTOMY  01/07/2016  . TRACHEOSTOMY TUBE PLACEMENT N/A 02/22/2016   Procedure: TRACHEOSTOMY;  Surgeon: Rozetta Nunnery, MD;  Location: [facility];  Service: ENT;  Laterality: N/A;  . TRACHEOSTOMY TUBE PLACEMENT N/A 01/14/2017   Procedure: TRACHEOSTOMY;  Surgeon: Rozetta Nunnery, MD;  Location: [facility];  Service: ENT;  Laterality: N/A;  inserted #[id number] VQM#[id number]    Patient Care Team: System, Pcp Not In as PCP - General  Social History   Social History  . Marital status: Single    Spouse name: N/A  . Number of children: N/A  . Years of education: N/A   Occupational History  . Not on file.   Social History Main Topics  . Smoking status: Former Smoker    Packs/day: 2.00    Years: 38.00    Types: Cigarettes  . Smokeless tobacco: Never Used     Comment: quit spring of 2017.  Marland Kitchen Alcohol use No  . Drug use: No  . Sexual activity: Not on file   Other Topics Concern  . Not on file   Social History Narrative   Lives @ SNF since Spring 2017.   Sedentary.  Unsteady on his feet.  Falls about 1x/wk.     reports that he has quit smoking. His smoking use included Cigarettes. He has a 76.00 pack-year smoking history. He has never used smokeless tobacco. He reports that he does not drink alcohol or use drugs.  Family History  Problem Relation Age of Onset  . Alcohol abuse Mother   . Hypertension Mother   . Coronary artery disease Mother   . Alcohol abuse Father   . Hypertension Father   . Coronary artery disease Father   . Alcohol abuse Maternal Uncle    Family Status  Relation Status  . Mother (Not Specified)  . Father (Not Specified)  . Mat Uncle (Not Specified)    Immunization History  Administered Date(s) Administered  . PPD  Test 07/01/2016, 07/08/2016, 03/30/2017  . Pneumococcal Polysaccharide-23 08/03/2012  . Tdap 12/11/2011    Allergies  Allergen Reactions  . Codeine Anaphylaxis and Nausea And Vomiting  . Lisinopril Itching and Rash  . Hydrocodone Other (See Comments)    On MAR  . Hydrocodone-Acetaminophen Nausea And Vomiting    Cannot take any acetaminophen due to liver issues per sister  . Tegaderm Ag Mesh [Silver] Other (See Comments)    On MAR  . Tylenol [Acetaminophen] Nausea Only and Other (See Comments)    Cannot take any acetaminophen due to liver issues per sister    Medications: Patient's Medications  New Prescriptions   No medications on file  Previous Medications   AMINO ACIDS-PROTEIN HYDROLYS (FEEDING SUPPLEMENT, PRO-STAT SUGAR FREE 64,) LIQD    Place 60 mLs into feeding tube 2 (two) times daily.    ATORVASTATIN (LIPITOR) 40 MG TABLET    Place 40 mg into feeding tube at bedtime. for Lipids   BACITRACIN 500 UNIT/GM OINTMENT    Apply 1 application topically See admin instructions. Apply to left hip topically every day shift for wound care - cover with dry dressing   CYANOCOBALAMIN 500 MCG TABLET    Place 1,000 mcg into feeding tube daily.   FAMOTIDINE (PEPCID) 20 MG TABLET    Place 20 mg into  feeding tube 2 (two) times daily. for GERD   FOLIC ACID (FOLVITE) 1 MG TABLET    Place 1 mg into feeding tube daily.    GUAIFENESIN 200 MG TABLET    Place 200 mg into feeding tube 3 (three) times daily. for CHF   LIDOCAINE (LIDODERM) 5 %    Place 1 patch onto the skin daily. Remove & Discard patch within 12 hours or as directed by MD   METOCLOPRAMIDE (REGLAN) 5 MG TABLET    Place 5 mg into feeding tube See admin instructions. Ordered 03/16/17 - 1 tablet (5 mg) per tube every 8 hours for GERD for 3 days   METOPROLOL TARTRATE (LOPRESSOR) 25 MG TABLET    Place 25 mg into feeding tube 2 (two) times daily.   MULTIPLE VITAMIN (MULTIVITAMIN WITH MINERALS) TABS TABLET    Place 1 tablet into feeding tube daily.   NUTRITIONAL SUPPLEMENTS (FEEDING SUPPLEMENT, OSMOLITE 1.5 CAL,) LIQD    Give 62cc/hr continuous via PEG-Tube every shift   OLANZAPINE (ZYPREXA) 5 MG TABLET    Place 5 mg into feeding tube 2 (two) times daily. Give 1 tablet via G-tube two times daily    OMEGA-3 FATTY ACIDS (FISH OIL) 1000 MG CAPS    Place 1,000 mg into feeding tube daily.   OXYCODONE-ACETAMINOPHEN (PERCOCET/ROXICET) 5-325 MG TABLET    Give 1 tablet via G-tube every 8 hours for pain   OXYGEN    See admin instructions. Oxygen at 28% via trach cuff every shift   POLYETHYLENE GLYCOL (MIRALAX / GLYCOLAX) PACKET    Place 17 g into feeding tube daily. for constipation   SENNA (SENOKOT) 8.6 MG TABS TABLET    Place 1 tablet into feeding tube 2 (two) times daily. For constipation   TAMSULOSIN (FLOMAX) 0.4 MG CAPS CAPSULE    Give 1 capsule via PEG-Tube one time daily   UNABLE TO FIND    HSG NPO (Nothing by mouth). Resident is NPO   WARFARIN (COUMADIN) 6 MG TABLET    Place 6 mg into feeding tube every evening.    WATER FOR IRRIGATION, STERILE (FREE WATER) SOLN    Place 150 mLs into feeding  tube every 4 (four) hours.  Modified Medications   No medications on file  Discontinued Medications   No medications on file    Review of Systems    Unable to perform ROS: Dementia    Vitals:   04/04/17 1101  BP: 105/67  Pulse: 86  Resp: (!) 21  Temp: 99.1 F (37.3 C)  TempSrc: Oral  SpO2: 95%  Weight: 204 lb (92.5 kg)  Height: 5' 11"  (1.803 m)   Body mass index is 28.45 kg/m.  Physical Exam  Constitutional: He appears well-developed.  Frail appearing sitting up in bed in NAD; voice is hoarse  HENT:  Mouth/Throat: Oropharynx is clear and moist.  MMM; no oral thrush  Eyes: Pupils are equal, round, and reactive to light. No scleral icterus.  Neck: Neck supple. Carotid bruit is not present. No thyromegaly present.  Trach collar intact with no purulent d/c/blood  Cardiovascular: Normal rate, regular rhythm and intact distal pulses.  Exam reveals no gallop and no friction rub.   Murmur heard.  Systolic murmur is present with a grade of 1/6  L>RLE edema b/l. No calf TTP  Pulmonary/Chest: Effort normal. He has decreased breath sounds. He has no wheezes. He has no rales. He exhibits no tenderness.  Abdominal: Soft. Normal appearance and bowel sounds are normal. He exhibits no distension, no abdominal bruit, no pulsatile midline mass and no mass. There is no hepatomegaly. There is no tenderness. There is no rigidity, no rebound and no guarding. No hernia.  Peg tube intact with no redness or d/c at insertion site  Musculoskeletal: He exhibits edema and deformity (LUE/LLE flexion contractures). He exhibits no tenderness.  Reduced ROM in left shoulder and left hip/knee  Lymphadenopathy:    He has no cervical adenopathy.  Neurological: He is alert.  Skin: Skin is warm and dry. No rash noted.  Pressure injury followed by wound care  Psychiatric: He has a normal mood and affect. His behavior is normal.     Labs reviewed: Nursing Home on 04/03/2017  Component Date Value Ref Range Status  . INR 03/31/2017 2.9* 0.9 - 1.1 Final  . Protime 03/31/2017 30.2* 10.0 - 13.8 Final  Admission on 04/02/2017, Discharged on 04/03/2017   Component Date Value Ref Range Status  . pH, Arterial 04/02/2017 7.533* 7.350 - 7.450 Final  . pCO2 arterial 04/02/2017 35.3  32.0 - 48.0 mmHg Final  . pO2, Arterial 04/02/2017 53.0* 83.0 - 108.0 mmHg Final  . Bicarbonate 04/02/2017 29.7* 20.0 - 28.0 mmol/L Final  . TCO2 04/02/2017 31  0 - 100 mmol/L Final  . O2 Saturation 04/02/2017 91.0  % Final  . Acid-Base Excess 04/02/2017 7.0* 0.0 - 2.0 mmol/L Final  . Patient temperature 04/02/2017 98.6 F   Final  . Collection site 04/02/2017 RADIAL, ALLEN'S TEST ACCEPTABLE   Final  . Drawn by 04/02/2017 RT   Final  . Sample type 04/02/2017 ARTERIAL   Final  Admission on 03/17/2017, Discharged on 03/30/2017  No results displayed because visit has over 200 results.    Admission on 01/22/2017, Discharged on 03/16/2017  No results displayed because visit has over 200 results.    Admission on 01/14/2017, Discharged on 01/22/2017  Component Date Value Ref Range Status  . WBC 01/16/2017 15.2* 4.0 - 10.5 K/uL Final  . RBC 01/16/2017 3.96* 4.22 - 5.81 MIL/uL Final  . Hemoglobin 01/16/2017 12.7* 13.0 - 17.0 g/dL Final  . HCT 01/16/2017 41.1  39.0 - 52.0 % Final  . MCV 01/16/2017 103.8*  78.0 - 100.0 fL Final  . MCH 01/16/2017 32.1  26.0 - 34.0 pg Final  . MCHC 01/16/2017 30.9  30.0 - 36.0 g/dL Final  . RDW 01/16/2017 15.8* 11.5 - 15.5 % Final  . Platelets 01/16/2017 178  150 - 400 K/uL Final  . Neutrophils Relative % 01/16/2017 60  % Final  . Lymphocytes Relative 01/16/2017 24  % Final  . Monocytes Relative 01/16/2017 8  % Final  . Eosinophils Relative 01/16/2017 7  % Final  . Basophils Relative 01/16/2017 1  % Final  . Neutro Abs 01/16/2017 9.1* 1.7 - 7.7 K/uL Final  . Lymphs Abs 01/16/2017 3.6  0.7 - 4.0 K/uL Final  . Monocytes Absolute 01/16/2017 1.2* 0.1 - 1.0 K/uL Final  . Eosinophils Absolute 01/16/2017 1.1* 0.0 - 0.7 K/uL Final  . Basophils Absolute 01/16/2017 0.2* 0.0 - 0.1 K/uL Final  . RBC Morphology 01/16/2017 RARE NRBCs   Final    Comment: POLYCHROMASIA PRESENT TARGET CELLS   . Smear Review 01/16/2017 LARGE PLATELETS PRESENT   Final  . Sodium 01/16/2017 152* 135 - 145 mmol/L Final  . Potassium 01/16/2017 3.6  3.5 - 5.1 mmol/L Final  . Chloride 01/16/2017 108  101 - 111 mmol/L Final  . CO2 01/16/2017 35* 22 - 32 mmol/L Final  . Glucose, Bld 01/16/2017 189* 65 - 99 mg/dL Final  . BUN 01/16/2017 66* 6 - 20 mg/dL Final  . Creatinine, Ser 01/16/2017 2.47* 0.61 - 1.24 mg/dL Final  . Calcium 01/16/2017 10.3  8.9 - 10.3 mg/dL Final  . GFR calc non Af Amer 01/16/2017 27* >60 mL/min Final  . GFR calc Af Amer 01/16/2017 31* >60 mL/min Final   Comment: (NOTE) The eGFR has been calculated using the CKD EPI equation. This calculation has not been validated in all clinical situations. eGFR's persistently <60 mL/min signify possible Chronic Kidney Disease.   . Anion gap 01/16/2017 9  5 - 15 Final  . Magnesium 01/16/2017 2.4  1.7 - 2.4 mg/dL Final  . Total Protein 01/16/2017 7.3  6.5 - 8.1 g/dL Final  . Albumin 01/16/2017 2.1* 3.5 - 5.0 g/dL Final  . AST 01/16/2017 34  15 - 41 U/L Final  . ALT 01/16/2017 23  17 - 63 U/L Final  . Alkaline Phosphatase 01/16/2017 131* 38 - 126 U/L Final  . Total Bilirubin 01/16/2017 0.5  0.3 - 1.2 mg/dL Final  . Bilirubin, Direct 01/16/2017 <0.1* 0.1 - 0.5 mg/dL Final  . Indirect Bilirubin 01/16/2017 NOT CALCULATED  0.3 - 0.9 mg/dL Final  . Specimen Description 01/16/2017 TRACHEAL ASPIRATE   Final  . Special Requests 01/16/2017 NONE   Final  . Gram Stain 01/16/2017    Final                   Value:ABUNDANT WBC PRESENT,BOTH PMN AND MONONUCLEAR FEW GRAM POSITIVE COCCI IN PAIRS RARE GRAM POSITIVE RODS   . Culture 01/16/2017 ABUNDANT METHICILLIN RESISTANT STAPHYLOCOCCUS AUREUS   Final  . Report Status 01/16/2017 01/19/2017 FINAL   Final  . Organism ID, Bacteria 01/16/2017 METHICILLIN RESISTANT STAPHYLOCOCCUS AUREUS   Final  . Prothrombin Time 01/17/2017 13.9  11.4 - 15.2 seconds Final  . INR  01/17/2017 1.07   Final  . Prothrombin Time 01/18/2017 13.9  11.4 - 15.2 seconds Final  . INR 01/18/2017 1.07   Final  . Prothrombin Time 01/19/2017 14.6  11.4 - 15.2 seconds Final  . INR 01/19/2017 1.13   Final  . WBC 01/20/2017 16.5* 4.0 -  10.5 K/uL Final  . RBC 01/20/2017 3.86* 4.22 - 5.81 MIL/uL Final  . Hemoglobin 01/20/2017 12.5* 13.0 - 17.0 g/dL Final  . HCT 01/20/2017 39.1  39.0 - 52.0 % Final  . MCV 01/20/2017 101.3* 78.0 - 100.0 fL Final  . MCH 01/20/2017 32.4  26.0 - 34.0 pg Final  . MCHC 01/20/2017 32.0  30.0 - 36.0 g/dL Final  . RDW 01/20/2017 15.0  11.5 - 15.5 % Final  . Platelets 01/20/2017 155  150 - 400 K/uL Final  . Sodium 01/20/2017 146* 135 - 145 mmol/L Final  . Potassium 01/20/2017 3.8  3.5 - 5.1 mmol/L Final  . Chloride 01/20/2017 104  101 - 111 mmol/L Final  . CO2 01/20/2017 33* 22 - 32 mmol/L Final  . Glucose, Bld 01/20/2017 166* 65 - 99 mg/dL Final  . BUN 01/20/2017 72* 6 - 20 mg/dL Final  . Creatinine, Ser 01/20/2017 2.31* 0.61 - 1.24 mg/dL Final  . Calcium 01/20/2017 10.6* 8.9 - 10.3 mg/dL Final  . GFR calc non Af Amer 01/20/2017 29* >60 mL/min Final  . GFR calc Af Amer 01/20/2017 33* >60 mL/min Final   Comment: (NOTE) The eGFR has been calculated using the CKD EPI equation. This calculation has not been validated in all clinical situations. eGFR's persistently <60 mL/min signify possible Chronic Kidney Disease.   . Anion gap 01/20/2017 9  5 - 15 Final  . Prothrombin Time 01/20/2017 15.7* 11.4 - 15.2 seconds Final  . INR 01/20/2017 1.24   Final  . Prothrombin Time 01/21/2017 15.1  11.4 - 15.2 seconds Final  . INR 01/21/2017 1.18   Final  Admission on 12/13/2016, Discharged on 01/14/2017  No results displayed because visit has over 200 results.      Dg Chest 2 View  Result Date: 03/17/2017 CLINICAL DATA:  61 year old with chronic ventilator dependent respiratory failure presenting with cough. Current history of chronic systolic congestive heart  failure, hypertension, cardiomyopathy and COPD. EXAM: CHEST  2 VIEW 3:48 p.m.: COMPARISON:  Portable chest x-ray earlier today 1:43 p.m., 03/06/2017 and earlier. FINDINGS: AP erect and lateral images were obtained. Tracheostomy tube tip in satisfactory position below the thoracic inlet projecting approximately 5 cm above carina. Patchy airspace opacities in the lung bases, left greater than right, similar to the examination earlier today, new since 03/06/2017. Slight improvement in the interstitial pulmonary edema since earlier today. No new pulmonary parenchymal abnormalities since earlier today. IMPRESSION: 1. Tracheostomy tube tip in satisfactory position approximately 5 cm above carina. 2. Pneumonia involving the lung bases, left greater than right. 3. Improving interstitial pulmonary edema. Electronically Signed   By: Evangeline Dakin M.D.   On: 03/17/2017 16:14   Ct Chest Wo Contrast  Result Date: 03/29/2017 CLINICAL DATA:  Shortness of breath, history respiratory failure requiring increased oxygen usage today, tracheostomy dependent, denies chest pain EXAM: CT CHEST WITHOUT CONTRAST TECHNIQUE: Multidetector CT imaging of the chest was performed following the standard protocol without IV contrast. Sagittal and coronal MPR images reconstructed from axial data set. COMPARISON:  12/23/2016 FINDINGS: Cardiovascular: Atherosclerotic calcifications aorta, proximal great vessels, and coronary arteries ascending aorta aneurysmally dilated 4.5 cm transverse image 62. No pericardial effusion. Calcifications at cardiac apex question prior MI. Mediastinum/Nodes: Esophagus unremarkable. Tracheostomy tube within trachea. Scattered normal sized mediastinal lymph nodes largest 10 mm RIGHT paratracheal node image 47 and 10 mm para-aortic node image 53. Base of cervical region unremarkable. Lungs/Pleura: Scattered peripheral chronic interstitial infiltrates in both lungs. Dependent atelectasis in both lower lobes improved  since previous exam. Central peribronchial thickening greatest in lower lobes. Scattered subpleural blebs at the apices greater on RIGHT. RIGHT upper lobe nodular density 9 mm diameter image 42 unchanged. No pleural effusion or pneumothorax. Upper Abdomen: Gastrostomy tube within collapsed stomach. Post splenectomy with question splenule at splenectomy bed. Visualized upper abdomen otherwise unremarkable. Musculoskeletal: Old appearing compression deformity of an upper thoracic vertebra approximately T6. Bones demineralized. No other focal bony abnormalities. IMPRESSION: Aortic atherosclerosis and coronary arterial calcifications aneurysmal dilatation of the ascending thoracic aorta 4.5 cm transverse, recommendation below. Ascending thoracic aortic aneurysm. Recommend semi-annual imaging followup by CTA or MRA and referral to cardiothoracic surgery if not already obtained. This recommendation follows 2010 ACCF/AHA/AATS/ACR/ASA/SCA/SCAI/SIR/STS/SVM Guidelines for the Diagnosis and Management of Patients With Thoracic Aortic Disease. Circulation. 2010; 121: E751-Z001 Mural calcification at cardiac apex question prior MI. Bronchitic changes with scattered chronic peripheral interstitial infiltrates in both lungs and improved bibasilar atelectasis. No definite acute infiltrate. Stable 9 mm RIGHT upper lobe nodule. Upper normal sized mediastinal lymph nodes without definite thoracic adenopathy. Electronically Signed   By: Lavonia Dana M.D.   On: 03/29/2017 12:45   Dg Chest Port 1 View  Result Date: 03/27/2017 CLINICAL DATA:  Shortness of Breath EXAM: PORTABLE CHEST 1 VIEW COMPARISON:  March 24, 2017 FINDINGS: Tracheostomy catheter tip is 4.5 cm above the carina. No pneumothorax. There is patchy airspace consolidation in the left base. There is mild bibasilar atelectasis as well. Heart is borderline enlarged with pulmonary vascularity within normal limits. No adenopathy. No bone lesions. IMPRESSION: Tracheostomy as  described without pneumothorax. Patchy airspace opacity left base, suspect a degree of pneumonia. Patchy atelectasis in the bases as well. Stable cardiac silhouette. Electronically Signed   By: Lowella Grip III M.D.   On: 03/27/2017 09:32   Dg Chest Port 1 View  Result Date: 03/24/2017 CLINICAL DATA:  Hypoxia EXAM: PORTABLE CHEST 1 VIEW COMPARISON:  Two days ago FINDINGS: Tracheostomy tube in place. Cardiomegaly and diffuse interstitial opacity. Low lung volumes. Small layering pleural effusions are not excluded. No evidence of pneumothorax. IMPRESSION: 1. Cardiomegaly and mild pulmonary edema. 2. Low volume chest with atelectasis. 3. No change from study 2 days prior. Electronically Signed   By: Monte Fantasia M.D.   On: 03/24/2017 14:47   Dg Chest Port 1 View  Result Date: 03/22/2017 CLINICAL DATA:  Hypoxia EXAM: PORTABLE CHEST 1 VIEW COMPARISON:  03/19/2017; 03/17/2017; 03/06/2017 FINDINGS: Grossly unchanged enlarged cardiac silhouette and mediastinal contours given AP projection. Stable position of support apparatus. Pulmonary vasculature remains indistinct with cephalization of flow. Minimally improved aeration of lungs with persistent bibasilar opacities, left greater than right. No new focal airspace opacities. No definite pleural effusion or pneumothorax. No acute osseus abnormalities. IMPRESSION: 1.  Stable positioning of support apparatus.  No pneumothorax. 2. Minimally improved bibasilar atelectasis with otherwise similar findings of mild pulmonary edema. Electronically Signed   By: Sandi Mariscal M.D.   On: 03/22/2017 09:15   Dg Chest Port 1 View  Result Date: 03/19/2017 CLINICAL DATA:  Shortness of breath EXAM: PORTABLE CHEST 1 VIEW COMPARISON:  03/17/2017; 03/06/2017; 01/27/2017 FINDINGS: Unchanged enlarged cardiac silhouette and mediastinal contours. Stable position of support apparatus. Pulmonary vasculature appears less distinct with cephalization of flow. Interval development of trace  right and small left-sided effusion with associated worsening bibasilar opacities, left greater than right. No pneumothorax. No acute osseus abnormalities. Old right anterior rib fractures. IMPRESSION: 1.  No acute cardiopulmonary disease. 2. Suspected worsening pulmonary edema and bibasilar atelectasis, left  greater than right. Electronically Signed   By: Sandi Mariscal M.D.   On: 03/19/2017 13:40   Dg Chest Port 1 View  Result Date: 03/17/2017 CLINICAL DATA:  Shortness of breath EXAM: PORTABLE CHEST 1 VIEW COMPARISON:  03/06/2017, 02/20/2017, 02/08/2017 FINDINGS: Tracheostomy tube tip about 2.4 cm superior to the carina and projects over the tracheal air column. Cardiomegaly with central vascular congestion and mild interstitial edema. Streaky opacity at the left base may reflect atelectasis or an infiltrate. Old right clavicular fracture and old rib fractures. IMPRESSION: Tracheostomy tube tip projects over tracheal air column and tip is about 2.4 cm superior to carina Cardiomegaly with central vascular congestion and mild diffuse edema. Streaky atelectasis or pneumonia at the left base. Electronically Signed   By: Donavan Foil M.D.   On: 03/17/2017 14:02   Dg Chest Port 1 View  Result Date: 03/06/2017 CLINICAL DATA:  RESP FAILURE,,HX ASPIRATION PNA EXAM: PORTABLE CHEST 1 VIEW COMPARISON:  02/20/2017 FINDINGS: Tracheostomy tube tip projects over the tracheal air shadow with a mildly exaggerated angulation some of which could possibly be due to thoracic kyphosis, and similar to prior exams. Mild enlargement of the cardiopericardial silhouette with indistinct pulmonary vasculature and interstitial accentuation. The indistinct left perihilar airspace opacities improved. No visible pleural effusion. Evidence of prior bilateral rib fractures. I do not see a current pneumothorax. Deformity from old healed right clavicular fracture. IMPRESSION: 1. Improved airspace opacity in the left mid lung. 2. Cardiomegaly  with indistinct pulmonary vasculature and interstitial accentuation which could reflect mild interstitial edema. 3. Old healed fractures bilaterally. 4. Tracheostomy tube noted, slightly exaggerated angulation between the tube in the trachea although this could be caused by kyphosis. Electronically Signed   By: Van Clines M.D.   On: 03/06/2017 07:13     Assessment/Plan   ICD-10-CM   1. Physical deconditioning R53.81   2. Protein-calorie malnutrition, unspecified severity ([place]) E46   3. Apical mural thrombus I51.3   4. PEG (percutaneous endoscopic gastrostomy) status ([place]) Z93.1   5. Chronic systolic heart failure (HCC) I50.22    EF 25-30%  6. Chronic respiratory failure with hypoxia (HCC) J96.11   7. Diabetes mellitus, type II, insulin dependent (HCC) E11.9    Z79.4   8. Essential hypertension I10   9. Depression with anxiety F41.8   10. Dementia without behavioral disturbance, unspecified dementia type F03.90   11. Mixed hyperlipidemia E78.2      Check CBC w diff, CMP  Will need lipid panel to follow hyperlipidemia  Follow INR closely. GOAL INR 2-3 for apical mural thrombus  He is NPO. Cont TF with water flushes as ordered  Cont current meds as ordered  F/u with specialists as scheduled  PT/OT/ST as ordered  GOAL: short term rehab with potential for long term care. Overall prognosis is poor due to multiple comorbidities. Communicated with pt and nursing.  Will follow  Jaquetta Currier S. Perlie Gold  [facility] [address] [place], [place] [address] [phone] Cell (Monday-Friday 8 AM - 5 PM) [phone] After 5 PM and follow prompts

## 2017-04-04 NOTE — Telephone Encounter (Signed)
[facility] #: [phone] Fax#: [phone]  

## 2017-04-06 NOTE — Telephone Encounter (Signed)
JJ please advise. Thanks.

## 2017-04-07 ENCOUNTER — Encounter (HOSPITAL_COMMUNITY): Payer: Self-pay | Admitting: General Practice

## 2017-04-07 ENCOUNTER — Emergency Department (HOSPITAL_COMMUNITY): Payer: Medicare Other

## 2017-04-07 ENCOUNTER — Inpatient Hospital Stay (HOSPITAL_COMMUNITY)
Admission: EM | Admit: 2017-04-07 | Discharge: 2017-04-13 | DRG: 871 | Disposition: A | Discharge status: Skilled Nursing Facility | Payer: Medicare Other | Attending: Internal Medicine | Admitting: Internal Medicine

## 2017-04-07 ENCOUNTER — Inpatient Hospital Stay (HOSPITAL_COMMUNITY): Payer: Medicare Other

## 2017-04-07 DIAGNOSIS — N184 Chronic kidney disease, stage 4 (severe): Secondary | ICD-10-CM | POA: Diagnosis present

## 2017-04-07 DIAGNOSIS — Z96642 Presence of left artificial hip joint: Secondary | ICD-10-CM | POA: Diagnosis present

## 2017-04-07 DIAGNOSIS — F329 Major depressive disorder, single episode, unspecified: Secondary | ICD-10-CM | POA: Diagnosis present

## 2017-04-07 DIAGNOSIS — E1122 Type 2 diabetes mellitus with diabetic chronic kidney disease: Secondary | ICD-10-CM | POA: Diagnosis present

## 2017-04-07 DIAGNOSIS — N179 Acute kidney failure, unspecified: Secondary | ICD-10-CM | POA: Diagnosis present

## 2017-04-07 DIAGNOSIS — D649 Anemia, unspecified: Secondary | ICD-10-CM | POA: Diagnosis not present

## 2017-04-07 DIAGNOSIS — I2510 Atherosclerotic heart disease of native coronary artery without angina pectoris: Secondary | ICD-10-CM | POA: Diagnosis present

## 2017-04-07 DIAGNOSIS — A419 Sepsis, unspecified organism: Secondary | ICD-10-CM | POA: Diagnosis not present

## 2017-04-07 DIAGNOSIS — I69354 Hemiplegia and hemiparesis following cerebral infarction affecting left non-dominant side: Secondary | ICD-10-CM

## 2017-04-07 DIAGNOSIS — R1312 Dysphagia, oropharyngeal phase: Secondary | ICD-10-CM

## 2017-04-07 DIAGNOSIS — R0602 Shortness of breath: Secondary | ICD-10-CM

## 2017-04-07 DIAGNOSIS — F0390 Unspecified dementia without behavioral disturbance: Secondary | ICD-10-CM | POA: Diagnosis present

## 2017-04-07 DIAGNOSIS — D62 Acute posthemorrhagic anemia: Secondary | ICD-10-CM | POA: Diagnosis present

## 2017-04-07 DIAGNOSIS — R0902 Hypoxemia: Secondary | ICD-10-CM | POA: Diagnosis not present

## 2017-04-07 DIAGNOSIS — E46 Unspecified protein-calorie malnutrition: Secondary | ICD-10-CM | POA: Diagnosis present

## 2017-04-07 DIAGNOSIS — Z931 Gastrostomy status: Secondary | ICD-10-CM

## 2017-04-07 DIAGNOSIS — I429 Cardiomyopathy, unspecified: Secondary | ICD-10-CM

## 2017-04-07 DIAGNOSIS — Z930 Tracheostomy status: Secondary | ICD-10-CM

## 2017-04-07 DIAGNOSIS — J9610 Chronic respiratory failure, unspecified whether with hypoxia or hypercapnia: Secondary | ICD-10-CM | POA: Diagnosis present

## 2017-04-07 DIAGNOSIS — J690 Pneumonitis due to inhalation of food and vomit: Secondary | ICD-10-CM | POA: Diagnosis present

## 2017-04-07 DIAGNOSIS — J9621 Acute and chronic respiratory failure with hypoxia: Secondary | ICD-10-CM | POA: Diagnosis present

## 2017-04-07 DIAGNOSIS — J449 Chronic obstructive pulmonary disease, unspecified: Secondary | ICD-10-CM | POA: Diagnosis present

## 2017-04-07 DIAGNOSIS — E119 Type 2 diabetes mellitus without complications: Secondary | ICD-10-CM

## 2017-04-07 DIAGNOSIS — I513 Intracardiac thrombosis, not elsewhere classified: Secondary | ICD-10-CM | POA: Diagnosis present

## 2017-04-07 DIAGNOSIS — I5022 Chronic systolic (congestive) heart failure: Secondary | ICD-10-CM

## 2017-04-07 DIAGNOSIS — K219 Gastro-esophageal reflux disease without esophagitis: Secondary | ICD-10-CM

## 2017-04-07 DIAGNOSIS — Z888 Allergy status to other drugs, medicaments and biological substances status: Secondary | ICD-10-CM

## 2017-04-07 DIAGNOSIS — I10 Essential (primary) hypertension: Secondary | ICD-10-CM | POA: Diagnosis present

## 2017-04-07 DIAGNOSIS — R1310 Dysphagia, unspecified: Secondary | ICD-10-CM | POA: Diagnosis present

## 2017-04-07 DIAGNOSIS — J9503 Malfunction of tracheostomy stoma: Secondary | ICD-10-CM

## 2017-04-07 DIAGNOSIS — I130 Hypertensive heart and chronic kidney disease with heart failure and stage 1 through stage 4 chronic kidney disease, or unspecified chronic kidney disease: Secondary | ICD-10-CM | POA: Diagnosis present

## 2017-04-07 DIAGNOSIS — Z794 Long term (current) use of insulin: Secondary | ICD-10-CM

## 2017-04-07 DIAGNOSIS — F32A Depression, unspecified: Secondary | ICD-10-CM | POA: Diagnosis present

## 2017-04-07 DIAGNOSIS — Z8679 Personal history of other diseases of the circulatory system: Secondary | ICD-10-CM

## 2017-04-07 DIAGNOSIS — Z9081 Acquired absence of spleen: Secondary | ICD-10-CM

## 2017-04-07 DIAGNOSIS — J9509 Other tracheostomy complication: Secondary | ICD-10-CM | POA: Diagnosis not present

## 2017-04-07 DIAGNOSIS — I714 Abdominal aortic aneurysm, without rupture: Secondary | ICD-10-CM | POA: Diagnosis present

## 2017-04-07 DIAGNOSIS — J9600 Acute respiratory failure, unspecified whether with hypoxia or hypercapnia: Secondary | ICD-10-CM | POA: Diagnosis not present

## 2017-04-07 DIAGNOSIS — A4102 Sepsis due to Methicillin resistant Staphylococcus aureus: Principal | ICD-10-CM | POA: Diagnosis present

## 2017-04-07 DIAGNOSIS — J411 Mucopurulent chronic bronchitis: Secondary | ICD-10-CM

## 2017-04-07 DIAGNOSIS — E785 Hyperlipidemia, unspecified: Secondary | ICD-10-CM | POA: Diagnosis present

## 2017-04-07 DIAGNOSIS — F05 Delirium due to known physiological condition: Secondary | ICD-10-CM | POA: Diagnosis present

## 2017-04-07 DIAGNOSIS — J9611 Chronic respiratory failure with hypoxia: Secondary | ICD-10-CM

## 2017-04-07 DIAGNOSIS — Z79899 Other long term (current) drug therapy: Secondary | ICD-10-CM

## 2017-04-07 DIAGNOSIS — Z7901 Long term (current) use of anticoagulants: Secondary | ICD-10-CM

## 2017-04-07 DIAGNOSIS — I4581 Long QT syndrome: Secondary | ICD-10-CM | POA: Diagnosis present

## 2017-04-07 HISTORY — DX: Type 2 diabetes mellitus without complications: E11.9

## 2017-04-07 HISTORY — DX: Personal history of other diseases of the musculoskeletal system and connective tissue: Z87.39

## 2017-04-07 HISTORY — DX: Dependence on supplemental oxygen: Z99.81

## 2017-04-07 HISTORY — DX: Acute myocardial infarction, unspecified: I21.9

## 2017-04-07 HISTORY — DX: Cerebral infarction, unspecified: I63.9

## 2017-04-07 HISTORY — DX: Unspecified chronic bronchitis: J42

## 2017-04-07 HISTORY — DX: Anxiety disorder, unspecified: F41.9

## 2017-04-07 HISTORY — DX: Pneumonia, unspecified organism: J18.9

## 2017-04-07 HISTORY — DX: Personal history of other medical treatment: Z92.89

## 2017-04-07 HISTORY — DX: Unspecified viral hepatitis C without hepatic coma: B19.20

## 2017-04-07 LAB — CBC WITH DIFFERENTIAL/PLATELET
BAND NEUTROPHILS: 0 %
BASOS PCT: 0 %
Basophils Absolute: 0 10*3/uL (ref 0.0–0.1)
Blasts: 0 %
EOS PCT: 10 %
Eosinophils Absolute: 2.5 10*3/uL — ABNORMAL HIGH (ref 0.0–0.7)
HCT: 14.7 % — ABNORMAL LOW (ref 39.0–52.0)
Hemoglobin: 4.6 g/dL — CL (ref 13.0–17.0)
LYMPHS PCT: 30 %
Lymphs Abs: 7.6 10*3/uL — ABNORMAL HIGH (ref 0.7–4.0)
MCH: 31.7 pg (ref 26.0–34.0)
MCHC: 31.3 g/dL (ref 30.0–36.0)
MCV: 101.4 fL — ABNORMAL HIGH (ref 78.0–100.0)
MONOS PCT: 5 %
Metamyelocytes Relative: 0 %
Monocytes Absolute: 1.3 10*3/uL — ABNORMAL HIGH (ref 0.1–1.0)
Myelocytes: 0 %
NEUTROS ABS: 13.9 10*3/uL — AB (ref 1.7–7.7)
NEUTROS PCT: 55 %
NRBC: 0 /100{WBCs}
OTHER: 0 %
Platelets: 389 10*3/uL (ref 150–400)
Promyelocytes Absolute: 0 %
RBC: 1.45 MIL/uL — ABNORMAL LOW (ref 4.22–5.81)
RDW: 15 % (ref 11.5–15.5)
WBC: 25.3 10*3/uL — ABNORMAL HIGH (ref 4.0–10.5)

## 2017-04-07 LAB — COMPREHENSIVE METABOLIC PANEL
ALBUMIN: 2.4 g/dL — AB (ref 3.5–5.0)
ALK PHOS: 109 U/L (ref 38–126)
ALT: 28 U/L (ref 17–63)
ANION GAP: 9 (ref 5–15)
AST: 38 U/L (ref 15–41)
BUN: 52 mg/dL — ABNORMAL HIGH (ref 6–20)
CHLORIDE: 107 mmol/L (ref 101–111)
CO2: 27 mmol/L (ref 22–32)
Calcium: 9.7 mg/dL (ref 8.9–10.3)
Creatinine, Ser: 2.59 mg/dL — ABNORMAL HIGH (ref 0.61–1.24)
GFR calc non Af Amer: 25 mL/min — ABNORMAL LOW (ref >60)
GFR, EST AFRICAN AMERICAN: 29 mL/min — AB (ref >60)
Glucose, Bld: 102 mg/dL — ABNORMAL HIGH (ref 65–99)
POTASSIUM: 4 mmol/L (ref 3.5–5.1)
SODIUM: 143 mmol/L (ref 135–145)
Total Bilirubin: 0.6 mg/dL (ref 0.3–1.2)
Total Protein: 8.4 g/dL — ABNORMAL HIGH (ref 6.5–8.1)

## 2017-04-07 LAB — I-STAT CHEM 8, ED
BUN: 48 mg/dL — ABNORMAL HIGH (ref 6–20)
CALCIUM ION: 1.26 mmol/L (ref 1.15–1.40)
Chloride: 109 mmol/L (ref 101–111)
Creatinine, Ser: 2.6 mg/dL — ABNORMAL HIGH (ref 0.61–1.24)
Glucose, Bld: 98 mg/dL (ref 65–99)
HCT: 34 % — ABNORMAL LOW (ref 39.0–52.0)
HEMOGLOBIN: 11.6 g/dL — AB (ref 13.0–17.0)
Potassium: 3.7 mmol/L (ref 3.5–5.1)
SODIUM: 146 mmol/L — AB (ref 135–145)
TCO2: 27 mmol/L (ref 0–100)

## 2017-04-07 LAB — I-STAT ARTERIAL BLOOD GAS, ED
ACID-BASE EXCESS: 4 mmol/L — AB (ref 0.0–2.0)
BICARBONATE: 27 mmol/L (ref 20.0–28.0)
O2 Saturation: 87 %
PH ART: 7.508 — AB (ref 7.350–7.450)
TCO2: 28 mmol/L (ref 0–100)
pCO2 arterial: 34 mmHg (ref 32.0–48.0)
pO2, Arterial: 48 mmHg — ABNORMAL LOW (ref 83.0–108.0)

## 2017-04-07 LAB — LACTIC ACID, PLASMA
LACTIC ACID, VENOUS: 2 mmol/L — AB (ref 0.5–1.9)
Lactic Acid, Venous: 1.5 mmol/L (ref 0.5–1.9)

## 2017-04-07 LAB — URINALYSIS, ROUTINE W REFLEX MICROSCOPIC
Bilirubin Urine: NEGATIVE
Glucose, UA: NEGATIVE mg/dL
Ketones, ur: NEGATIVE mg/dL
Leukocytes, UA: NEGATIVE
NITRITE: NEGATIVE
PROTEIN: 100 mg/dL — AB
SPECIFIC GRAVITY, URINE: 1.016 (ref 1.005–1.030)
pH: 7 (ref 5.0–8.0)

## 2017-04-07 LAB — PROCALCITONIN: Procalcitonin: 0.14 ng/mL

## 2017-04-07 LAB — GLUCOSE, CAPILLARY: GLUCOSE-CAPILLARY: 132 mg/dL — AB (ref 65–99)

## 2017-04-07 LAB — PREPARE RBC (CROSSMATCH)

## 2017-04-07 LAB — PROTIME-INR
INR: 2.72
Prothrombin Time: 29.4 seconds — ABNORMAL HIGH (ref 11.4–15.2)

## 2017-04-07 LAB — I-STAT CG4 LACTIC ACID, ED
LACTIC ACID, VENOUS: 1.79 mmol/L (ref 0.5–1.9)
LACTIC ACID, VENOUS: 1.86 mmol/L (ref 0.5–1.9)

## 2017-04-07 MED ORDER — ORAL CARE MOUTH RINSE
15.0000 mL | Freq: Two times a day (BID) | OROMUCOSAL | Status: DC
  Administered 2017-04-08 – 2017-04-11 (×8): 15 mL via OROMUCOSAL

## 2017-04-07 MED ORDER — ONDANSETRON HCL 4 MG PO TABS: 4.0000 mg | ORAL_TABLET | Freq: Four times a day (QID) | ORAL | Status: DC | PRN

## 2017-04-07 MED ORDER — FAMOTIDINE 20 MG PO TABS
20.0000 mg | ORAL_TABLET | ORAL | Status: DC
  Administered 2017-04-07 – 2017-04-12 (×6): 20 mg
  Filled 2017-04-07 (×6): qty 1

## 2017-04-07 MED ORDER — FREE WATER
150.0000 mL | Status: DC
  Administered 2017-04-07 – 2017-04-13 (×35): 150 mL

## 2017-04-07 MED ORDER — HYDROMORPHONE HCL 1 MG/ML IJ SOLN: 0.5000 mg | INTRAMUSCULAR | Status: DC | PRN

## 2017-04-07 MED ORDER — PRO-STAT SUGAR FREE PO LIQD
60.0000 mL | Freq: Two times a day (BID) | ORAL | Status: DC
  Administered 2017-04-07 – 2017-04-13 (×12): 60 mL
  Filled 2017-04-07 (×13): qty 60

## 2017-04-07 MED ORDER — GUAIFENESIN 200 MG PO TABS
200.0000 mg | ORAL_TABLET | Freq: Three times a day (TID) | ORAL | Status: DC
  Administered 2017-04-07 – 2017-04-13 (×15): 200 mg
  Filled 2017-04-07 (×20): qty 1

## 2017-04-07 MED ORDER — SODIUM CHLORIDE 0.9 % IV SOLN
10.0000 mL/h | Freq: Once | INTRAVENOUS | Status: AC
  Administered 2017-04-07: 10 mL/h via INTRAVENOUS

## 2017-04-07 MED ORDER — OSMOLITE 1.5 CAL PO LIQD
1000.0000 mL | ORAL | Status: DC
  Administered 2017-04-07 – 2017-04-12 (×6): 1000 mL
  Filled 2017-04-07 (×11): qty 1000

## 2017-04-07 MED ORDER — PIPERACILLIN-TAZOBACTAM IN DEX 2-0.25 GM/50ML IV SOLN: 2.2500 g | Freq: Once | INTRAVENOUS | Status: DC

## 2017-04-07 MED ORDER — METOPROLOL TARTRATE 25 MG PO TABS
25.0000 mg | ORAL_TABLET | Freq: Two times a day (BID) | ORAL | Status: DC
  Administered 2017-04-07 – 2017-04-13 (×11): 25 mg
  Filled 2017-04-07 (×11): qty 1

## 2017-04-07 MED ORDER — CHLORHEXIDINE GLUCONATE 0.12 % MT SOLN
15.0000 mL | Freq: Two times a day (BID) | OROMUCOSAL | Status: DC
  Administered 2017-04-07 – 2017-04-13 (×10): 15 mL via OROMUCOSAL
  Filled 2017-04-07 (×11): qty 15

## 2017-04-07 MED ORDER — VANCOMYCIN HCL 10 G IV SOLR: 1250.0000 mg | INTRAVENOUS | Status: DC

## 2017-04-07 MED ORDER — OLANZAPINE 5 MG PO TABS
5.0000 mg | ORAL_TABLET | Freq: Two times a day (BID) | ORAL | Status: DC
  Administered 2017-04-07 – 2017-04-13 (×12): 5 mg
  Filled 2017-04-07 (×14): qty 1

## 2017-04-07 MED ORDER — OSMOLITE 1.5 CAL PO LIQD
237.0000 mL | ORAL | Status: DC
  Filled 2017-04-07 (×7): qty 237

## 2017-04-07 MED ORDER — WARFARIN SODIUM 6 MG PO TABS
6.0000 mg | ORAL_TABLET | Freq: Every day | ORAL | Status: AC
  Administered 2017-04-08: 6 mg via ORAL
  Filled 2017-04-07: qty 1

## 2017-04-07 MED ORDER — ATORVASTATIN CALCIUM 40 MG PO TABS
40.0000 mg | ORAL_TABLET | Freq: Every day | ORAL | Status: DC
  Administered 2017-04-07 – 2017-04-12 (×6): 40 mg
  Filled 2017-04-07 (×6): qty 1

## 2017-04-07 MED ORDER — VANCOMYCIN HCL 10 G IV SOLR
1250.0000 mg | INTRAVENOUS | Status: DC
  Administered 2017-04-09 – 2017-04-10 (×3): 1250 mg via INTRAVENOUS
  Filled 2017-04-07 (×3): qty 1250

## 2017-04-07 MED ORDER — PIPERACILLIN-TAZOBACTAM 3.375 G IVPB 30 MIN
3.3750 g | Freq: Once | INTRAVENOUS | Status: AC
  Administered 2017-04-07: 3.375 g via INTRAVENOUS
  Filled 2017-04-07: qty 50

## 2017-04-07 MED ORDER — FOLIC ACID 1 MG PO TABS
1.0000 mg | ORAL_TABLET | Freq: Every day | ORAL | Status: DC
  Administered 2017-04-07 – 2017-04-13 (×7): 1 mg
  Filled 2017-04-07 (×8): qty 1

## 2017-04-07 MED ORDER — DEXTROSE 5 % IV SOLN
1.0000 g | INTRAVENOUS | Status: DC
  Administered 2017-04-07 – 2017-04-11 (×4): 1 g via INTRAVENOUS
  Filled 2017-04-07 (×5): qty 1

## 2017-04-07 MED ORDER — ONDANSETRON HCL 4 MG/2ML IJ SOLN
4.0000 mg | Freq: Four times a day (QID) | INTRAMUSCULAR | Status: DC | PRN
Start: 2017-04-07 — End: 2017-04-13

## 2017-04-07 MED ORDER — ADULT MULTIVITAMIN W/MINERALS CH
1.0000 | ORAL_TABLET | Freq: Every day | ORAL | Status: DC
  Administered 2017-04-07 – 2017-04-13 (×7): 1
  Filled 2017-04-07 (×7): qty 1

## 2017-04-07 MED ORDER — SENNOSIDES-DOCUSATE SODIUM 8.6-50 MG PO TABS
1.0000 | ORAL_TABLET | Freq: Every evening | ORAL | Status: DC | PRN
  Administered 2017-04-07: 1 via ORAL
  Filled 2017-04-07: qty 1

## 2017-04-07 MED ORDER — BISACODYL 10 MG RE SUPP: 10.0000 mg | Freq: Every day | RECTAL | Status: DC | PRN

## 2017-04-07 MED ORDER — VANCOMYCIN HCL 10 G IV SOLR
1500.0000 mg | Freq: Once | INTRAVENOUS | Status: AC
  Administered 2017-04-07: 1500 mg via INTRAVENOUS
  Filled 2017-04-07: qty 1500

## 2017-04-07 MED ORDER — POLYETHYLENE GLYCOL 3350 17 G PO PACK: 17.0000 g | PACK | Freq: Every day | ORAL | Status: DC | PRN

## 2017-04-07 MED ORDER — VITAMIN B-12 1000 MCG PO TABS
1000.0000 ug | ORAL_TABLET | Freq: Every day | ORAL | Status: DC
  Administered 2017-04-07 – 2017-04-13 (×7): 1000 ug
  Filled 2017-04-07 (×7): qty 1

## 2017-04-07 MED ORDER — WARFARIN - PHYSICIAN DOSING INPATIENT
Freq: Every day | Status: DC
  Administered 2017-04-08: 18:00:00

## 2017-04-07 NOTE — ED Provider Notes (Signed)
[facility]-EMERGENCY DEPT Provider Note   CSN: [id number] Arrival date & time: 04/07/17  1058     History   Chief Complaint Chief Complaint  Patient presents with  . Fever  . Shortness of Breath    HPI ALFONSA VAILE is a 61 y.o. male.  The history is provided by the patient, the EMS personnel and the nursing home. No language interpreter was used.  Fever    Shortness of Breath  Associated symptoms include a fever.    NIKOLI NASSER is a 61 y.o. male who presents to the Emergency Department complaining of fever, sob.  He presents from his nursing facility for evaluation of shortness of breath and hypoxia. He reports feeling short of breath starting today. The facility was unable to suction him and unable to get his sats above 70-80%. He was noted to be febrile today as well to 101.5. Other than shortness of breath he denies any additional complaints.Level 5 caveat due to dementia.  Past Medical History:  Diagnosis Date  . Acute respiratory failure (HCC)    a. Spring 2017 following fall/splenectomy -->admitted to [facility]-->trach/g tube.  . Anemia    a. 12/2015 ABL in setting of fall/hematomas/splenic laceration req splenectomy.  Marland Kitchen Apical mural thrombus    a. 11/2015 Echo: EF 25-30%, moderate size apical thrombus-->coumadin;  b. 12/2015 f/u Echo: EF 25-30%, ant/septa HK, no obvious large thrombus but cannot exclude small mural thrombus.  Marland Kitchen CAD (coronary artery disease)    a. 12/2014 s/p BMS to the RCA.  Marland Kitchen Cerebral infarction (HCC)   . Chronic systolic CHF (congestive heart failure) (HCC)    a. 12/2015 Echo: EF 25-30%.  . Dementia   . Depression   . Dysphagia   . Falls    a. 11/2015 traumatic fall with resultant trauma req splenectomy and prolonged hospitalization complicated by resp failure.  Marland Kitchen History of pneumonia   . Hyperlipidemia   . Hypertension   . Ischemic cardiomyopathy    a. 12/2015 Echo: EF 25-30%, anterior and septal HK.  Marland Kitchen Left hemiplegia (HCC)   .  Protein calorie malnutrition (HCC)   . Respiratory failure (HCC)   . Spleen injury   . Status post tracheostomy (HCC)    a. 01/2016 in setting of ongoing resp failure and aspiration.    Patient Active Problem List   Diagnosis Date Noted  . Acute on chronic respiratory failure with hypoxia (HCC)   . Tracheostomy status (HCC)   . SOB (shortness of breath)   . Hypoxia   . Pressure injury of skin 03/20/2017  . HCAP (healthcare-associated pneumonia) 03/18/2017  . Pneumonia 03/17/2017  . 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 Depression with anxiety 01/23/2017  . Encounter for hospice care discussion   . DNR (do not resuscitate)   . Ischemic cardiomyopathy   . Goals of care, counseling/discussion   . Palliative care encounter   . Acute systolic congestive heart failure (HCC)   . Acute and chronic respiratory failure with hypoxia (HCC) 11/28/2016  . Hip fracture (HCC) 11/27/2016  . Closed nondisplaced intertrochanteric fracture of left femur (HCC) 11/27/2016  . Chronic respiratory failure (HCC) 11/27/2016  . Fall at nursing home 07/17/2016  . Diabetes mellitus, type II, insulin dependent (HCC) 06/12/2016  . GERD (gastroesophageal reflux disease) 05/07/2016  . COPD (chronic obstructive pulmonary disease) (HCC) 04/18/2016  . Chronic anticoagulation 03/22/2016  . S/P splenectomy 03/13/2016  . PEG (percutaneous endoscopic gastrostomy) status (HCC) 03/13/2016  .  MRSA (methicillin resistant Staphylococcus aureus) septicemia (HCC) 03/13/2016  . Aspiration pneumonia (HCC) 03/13/2016  . Dysphagia 03/13/2016  . Acute blood loss anemia 03/13/2016  . Hyperlipidemia 03/13/2016  . Heart failure, chronic systolic (HCC)   . Hypertension   . Depression   . Status post tracheostomy (HCC)   . Cardiomyopathy (HCC)   . Apical mural thrombus   . Protein calorie malnutrition (HCC)   . Intraparenchymal hematoma of brain due to trauma [facility])     Past Surgical History:    Procedure Laterality Date  . CARDIAC CATHETERIZATION    . CORONARY ANGIOPLASTY WITH STENT PLACEMENT    . HIP ARTHROPLASTY Left 02/10/2017   Procedure: LEFT HIP HEMIARTHROPLASTY;  Surgeon: [name], MD;  Location: [facility] OR;  Service: Orthopedics;  Laterality: Left;  . IR GASTROSTOMY TUBE MOD SED  02/02/2017  . IR GENERIC HISTORICAL  07/22/2016   IR GASTROSTOMY TUBE REMOVAL 07/22/2016 [name], MD [facility]-INTERV RAD  . PR LAP, SPLENECTOMY  01/07/2016  . TRACHEOSTOMY TUBE PLACEMENT N/A 02/22/2016   Procedure: TRACHEOSTOMY;  Surgeon: [name], MD;  Location[facility] OR;  Service: ENT;  Laterality: N/A;  . TRACHEOSTOMY TUBE PLACEMENT N/A 01/14/2017   Procedure: TRACHEOSTOMY;  Surgeon: [name], MD;  Location: [facility] OR;  Service: ENT;  Laterality: N/A;  inserted #[id number]       Home Medications    Prior to Admission medications   Medication Sig Start Date End Date Taking? Authorizing Provider  Amino Acids-Protein Hydrolys (FEEDING SUPPLEMENT, PRO-STAT SUGAR FREE 64,) LIQD Place 60 mLs into feeding tube 2 (two) times daily.     [provider]  atorvastatin (LIPITOR) 40 MG tablet Place 40 mg into feeding tube at bedtime. for Lipids    [provider]  bacitracin 500 UNIT/GM ointment Apply 1 application topically See admin instructions. Apply to left hip topically every day shift for wound care - cover with dry dressing    [provider]  cyanocobalamin 500 MCG tablet Place 1,000 mcg into feeding tube daily.    [provider]  famotidine (PEPCID) 20 MG tablet Place 20 mg into feeding tube 2 (two) times daily. for GERD    [provider]  folic acid (FOLVITE) 1 MG tablet Place 1 mg into feeding tube daily.     [provider]  guaiFENesin 200 MG tablet Place 200 mg into feeding tube 3 (three) times daily. for CHF    [provider]  lidocaine (LIDODERM) 5 % Place 1 patch onto the skin daily. Remove & Discard  patch within 12 hours or as directed by MD 12/13/16   Leroy Sea, MD  metoCLOPramide (REGLAN) 5 MG tablet Place 5 mg into feeding tube See admin instructions. Ordered 03/16/17 - 1 tablet (5 mg) per tube every 8 hours for GERD for 3 days 03/17/17 03/19/17  [provider]  metoprolol tartrate (LOPRESSOR) 25 MG tablet Place 25 mg into feeding tube 2 (two) times daily.    [provider]  Multiple Vitamin (MULTIVITAMIN WITH MINERALS) TABS tablet Place 1 tablet into feeding tube daily.    [provider]  Nutritional Supplements (FEEDING SUPPLEMENT, OSMOLITE 1.5 CAL,) LIQD Give 62cc/hr continuous via PEG-Tube every shift    [provider]  OLANZapine (ZYPREXA) 5 MG tablet Place 5 mg into feeding tube 2 (two) times daily. Give 1 tablet via G-tube two times daily     [provider]  Omega-3 Fatty Acids (FISH OIL) 1000 MG  CAPS Place 1,000 mg into feeding tube daily.    [provider]  oxyCODONE-acetaminophen (PERCOCET/ROXICET) 5-325 MG tablet Give 1 tablet via G-tube every 8 hours for pain 04/04/17   Sharon Seller, NP  OXYGEN See admin instructions. Oxygen at 28% via trach cuff every shift    [provider]  polyethylene glycol (MIRALAX / GLYCOLAX) packet Place 17 g into feeding tube daily. for constipation    [provider]  senna (SENOKOT) 8.6 MG TABS tablet Place 1 tablet into feeding tube 2 (two) times daily. For constipation    [provider]  tamsulosin (FLOMAX) 0.4 MG CAPS capsule Give 1 capsule via PEG-Tube one time daily    [provider]  UNABLE TO FIND HSG NPO (Nothing by mouth). Resident is NPO    [provider]  warfarin (COUMADIN) 6 MG tablet Place 6 mg into feeding tube every evening.     [provider]  Water For Irrigation, Sterile (FREE WATER) SOLN Place 150 mLs into feeding tube every 4 (four) hours. 03/30/17   Richarda Overlie, MD    Family History Family History   Problem Relation Age of Onset  . Alcohol abuse Mother   . Hypertension Mother   . Coronary artery disease Mother   . Alcohol abuse Father   . Hypertension Father   . Coronary artery disease Father   . Alcohol abuse Maternal Uncle     Social History Social History  Substance Use Topics  . Smoking status: Former Smoker    Packs/day: 2.00    Years: 38.00    Types: Cigarettes  . Smokeless tobacco: Never Used     Comment: quit spring of 2017.  Marland Kitchen Alcohol use No     Allergies   Codeine; Lisinopril; Hydrocodone; Hydrocodone-acetaminophen; Tegaderm ag mesh [silver]; and Tylenol [acetaminophen]   Review of Systems Review of Systems  Constitutional: Positive for fever.  Respiratory: Positive for shortness of breath.   All other systems reviewed and are negative.    Physical Exam Updated Vital Signs BP 115/71 (BP Location: Right Arm)   Pulse 85   Temp 98.5 F (36.9 C) (Oral)   Resp (!) 21   SpO2 94%   Physical Exam  Constitutional: He appears well-developed and well-nourished.  HENT:  Head: Normocephalic and atraumatic.  Neck:  Tracheostomy in anterior neck  Cardiovascular: Normal rate and regular rhythm.   No murmur heard. Pulmonary/Chest: Effort normal. No respiratory distress.  Rhonchi in right lung fields, decreased air movement in left lung fields.  Abdominal: Soft. There is no tenderness. There is no rebound and no guarding.  Genitourinary:  Genitourinary Comments: Nontender rectal examination with brown stool. No gross blood.  Musculoskeletal:  Contractures of left arm and bilateral lower extremities  Neurological: He is alert.  4/5 strength in the right upper extremity. Paralysis to left upper extremity and BLE  Skin: Skin is warm and dry.  Psychiatric: He has a normal mood and affect. His behavior is normal.  Nursing note and vitals reviewed.    ED Treatments / Results  Labs (all labs ordered are listed, but only abnormal results are  displayed) Labs Reviewed  CULTURE, BLOOD (ROUTINE X 2)  CULTURE, BLOOD (ROUTINE X 2)  CULTURE, EXPECTORATED SPUTUM-ASSESSMENT  URINE CULTURE  COMPREHENSIVE METABOLIC PANEL  CBC WITH DIFFERENTIAL/PLATELET  URINALYSIS, ROUTINE W REFLEX MICROSCOPIC  PROTIME-INR  I-STAT CG4 LACTIC ACID, ED  I-STAT TROPOININ, ED  I-STAT ARTERIAL BLOOD GAS, ED    EKG  EKG Interpretation  None       Radiology No results found.  Procedures Procedures (including critical care time) CRITICAL CARE Performed by: Tilden Fossa   Total critical care time: 35 minutes  Critical care time was exclusive of separately billable procedures and treating other patients.  Critical care was necessary to treat or prevent imminent or life-threatening deterioration.  Critical care was time spent personally by me on the following activities: development of treatment plan with patient and/or surrogate as well as nursing, discussions with consultants, evaluation of patient's response to treatment, examination of patient, obtaining history from patient or surrogate, ordering and performing treatments and interventions, ordering and review of laboratory studies, ordering and review of radiographic studies, pulse oximetry and re-evaluation of patient's condition.  Medications Ordered in ED Medications - No data to display   Initial Impression / Assessment and Plan / ED Course  I have reviewed the triage vital signs and the nursing notes.  Pertinent labs & imaging results that were available during my care of the patient were reviewed by me and considered in my medical decision making (see chart for details).     Patient here for evaluation of hypoxia, fever. On initial ED evaluation he endorses shortness of breath but on repeat assessment this has resolved. Chest x-ray with no concerning evidence of pneumonia but given his symptoms and history with antibiotics. CBCs demonstrate significant anemia with leukocytosis.  There is no evidence of hemorrhage on examination. His wife reports that he did have some bleeding from his tracheostomy site but it was only a small amount. He has no active trach bleeding in the emergency department. Providing blood transfusion, antibiotics will admit for further management.  Final Clinical Impressions(s) / ED Diagnoses   Final diagnoses:  None    New Prescriptions New Prescriptions   No medications on file     Tilden Fossa, MD 04/08/17 [phone]

## 2017-04-07 NOTE — ED Notes (Signed)
Per blood bank, 3 units of blood ready. Windell Moulding RN notified @ [phone].

## 2017-04-07 NOTE — ED Notes (Signed)
IV team at bedside 

## 2017-04-07 NOTE — Progress Notes (Signed)
Spoke with MD Danford about probable trach dislodgement.  Unable to pass catheter through clean inner cannula, patient is able to vocalize, no color change on easy cap C02 detector.  Patient is in no distress, sats 97%, RR 19, HR 89.  RN at bedside, MD to notify with any orders.

## 2017-04-07 NOTE — ED Notes (Signed)
ED Provider at bedside. 

## 2017-04-07 NOTE — Progress Notes (Addendum)
Pharmacy Antibiotic Note  Curtis Keller is a 61 y.o. male admitted on 04/07/2017 with sepsis.  Pharmacy has been consulted for vancomycin and cefepime dosing. Patient is currently afebrile, but had reported temp of 101.5 PTA. WBC elevated at 25.3 and LA 1.86. Noted renal impairment with SCr 2.60 (baseline 1.8-2) for estimated CrCl ~ 30-35 mL/min.   Plan: Vancomycin 1.5g IV x1, then 1250 mg IV q24hr  Cefepime 1g IV q24hr  Vancomycin trough at SS and as needed (goal 15-20 mcg/mL) Monitor renal function, clinical picture, and culture results F/u length of therapy   Temp (24hrs), Avg:98.5 F (36.9 C), Min:98.5 F (36.9 C), Max:98.5 F (36.9 C)   Recent Labs Lab 04/07/17 1153 04/07/17 1243 04/07/17 1426  WBC  --  25.3*  --   CREATININE  --  2.59* 2.60*  LATICACIDVEN 1.79  --  1.86    Estimated Creatinine Clearance: 34.3 mL/min (A) (by C-G formula based on SCr of 2.6 mg/dL (H)).    Allergies  Allergen Reactions  . Codeine Anaphylaxis and Nausea And Vomiting  . Lisinopril Itching and Rash  . Hydrocodone Other (See Comments)    On MAR  . Hydrocodone-Acetaminophen Nausea And Vomiting    Cannot take any acetaminophen due to liver issues per sister  . Tegaderm Ag Mesh [Silver] Other (See Comments)    On MAR  . Tylenol [Acetaminophen] Nausea Only and Other (See Comments)    Cannot take any acetaminophen due to liver issues per sister    Antimicrobials this admission: 6/15 Vanc >>  6/15 Zosyn x1  6/15 Cefepime >>   Dose adjustments this admission: n/a  Microbiology results: pending   York Cerise, PharmD Pharmacy Resident  Pager [phone] 04/07/17 2:43 PM

## 2017-04-07 NOTE — ED Notes (Addendum)
1 set of blood cultures and urine sample obtained prior to starting antibiotics. Pt receiving 30 min infusion of zosyn, then will start blood admin. Pt has one PIV at this time. IV team consulted, states "pt does not need second PIV at this time so that he can be monitored for blood vs antibiotic reaction." IV team not attempting to insert 2nd PIV. Vanc being held at this time to start blood. EDP aware.

## 2017-04-07 NOTE — Progress Notes (Addendum)
04/07/2017 Patient transfer from emergency room to 3M03 at 1820. Patient is alert to self, was not aware he was at the hospital and non ambulatory. Patient arrive to the unit blood was running. Patient is a trach patient from [facility] facility. Patient sacrum is pink but blanched, have a scar on the left hip from a prior hip surgery and heels dry. Peg tube is intact the area is pink around the tube. Patient weak on the left side contracture to the left arm. He arrive to the unit with a Shiley # 4 cuffless and suture in the trach from the facility. He receive a CHG bath, place on contact for MRSA he was positive on 03/17/2017. Elink and Central Monitor aware patient is on unit. [facility] Rn.

## 2017-04-07 NOTE — Progress Notes (Signed)
CRITICAL VALUE ALERT  Critical Value:  Lactic Acid (2.0)  Date & Time Notied:  04/07/2217 1924  Provider Notified: Dr Maryfrances Bunnell @2015  via Loretha Stapler  And spoke to him on the unit at 2100  Orders Received/Actions taken: No orders given

## 2017-04-07 NOTE — ED Notes (Signed)
Attempted IV. 

## 2017-04-07 NOTE — ED Triage Notes (Signed)
Pt arrives via EMS from [facility] with desat this morning to 76% per NH staff. Pt also reported to have fever to 102 this AM. Pt with rhochi on assessment. Pt on trach collar 15L 100% NRB. VSS. Afebrile at present. Pt with chronic sacral wound on arrival. NPO. Gtube to abdomen.

## 2017-04-07 NOTE — H&P (Signed)
History and Physical    Curtis Keller WUJ:[id number] DOB: 01-11-1956 DOA: 04/07/2017   PCP: System, Pcp Not In   Patient coming from:  Home    Chief Complaint: Shortness of breath   HPI: Curtis Keller is a 61 y.o. male with extensive medical history listed below, including tracheostomy dependent, on oxygen via trach, dysphagia,  status post PEG tube feeding, COPD, CVA in February 2017 with residual L hemiparesis/contracturs,  chronic systolic CHF, ICM with EF  25-30 %, CKD 3, chronic anemia, dementia, recurrent falls, recent heat fracture surgery, a history of LV apical thrombosis initially on  Eliquis then switched towarfarin due to development of CVA , history od AAA without rupture as of last discharge, Hep C and recent admission for aspiration pneumonia with hypoxia discharged on 03/30/2017 to SNF,  Brought  today for evaluation of fever and shortness of breath since this morning. The patient has a tracheostomy, which was felt to be the culprit of his hypoxia. The facility was unable to suction him, and able to get his saturations of both 70 to 80%. In the interim, he was noted to be febrile 101.5. History is obtained by his sister who is at his bedside as he is unable to engage in conversation, and she is very focused on his renal functions. He is not confused.    ED Course:  BP 115/67   Pulse (!) 113   Temp 98.5 F (36.9 C) (Oral)   Resp (!) 24   SpO2 (!) 88%    white count 25.3 hemoglobin 4.6 the  increasing to 11 after 2 units of blood.  platelets 389 creatinine 2.59 with BUN of 52, glucose 102   INR 2.72 lactic acid 1.79 creatinine 2.59 CXR with improving atx/infiltrate, no effusion   Review of Systems:  As per HPI otherwise all other systems reviewed and are negative  Past Medical History:  Diagnosis Date  . Acute respiratory failure (HCC)    a. Spring 2017 following fall/splenectomy -->admitted to [facility]-->trach/g tube.  . Anemia    a. 12/2015 ABL in  setting of fall/hematomas/splenic laceration req splenectomy.  Marland Kitchen Apical mural thrombus    a. 11/2015 Echo: EF 25-30%, moderate size apical thrombus-->coumadin;  b. 12/2015 f/u Echo: EF 25-30%, ant/septa HK, no obvious large thrombus but cannot exclude small mural thrombus.  Marland Kitchen CAD (coronary artery disease)    a. 12/2014 s/p BMS to the RCA.  Marland Kitchen Cerebral infarction (HCC)   . Chronic systolic CHF (congestive heart failure) (HCC)    a. 12/2015 Echo: EF 25-30%.  . Dementia   . Depression   . Dysphagia   . Falls    a. 11/2015 traumatic fall with resultant trauma req splenectomy and prolonged hospitalization complicated by resp failure.  Marland Kitchen History of pneumonia   . Hyperlipidemia   . Hypertension   . Ischemic cardiomyopathy    a. 12/2015 Echo: EF 25-30%, anterior and septal HK.  Marland Kitchen Left hemiplegia (HCC)   . Protein calorie malnutrition (HCC)   . Respiratory failure (HCC)   . Spleen injury   . Status post tracheostomy (HCC)    a. 01/2016 in setting of ongoing resp failure and aspiration.    Past Surgical History:  Procedure Laterality Date  . CARDIAC CATHETERIZATION    . CORONARY ANGIOPLASTY WITH STENT PLACEMENT    . HIP ARTHROPLASTY Left 02/10/2017   Procedure: LEFT HIP HEMIARTHROPLASTY;  Surgeon: [name], MD;  Location: [facility] OR;  Service: Orthopedics;  Laterality:  Left;  . IR GASTROSTOMY TUBE MOD SED  02/02/2017  . IR GENERIC HISTORICAL  07/22/2016   IR GASTROSTOMY TUBE REMOVAL 07/22/2016 [name], MD [facility]-INTERV RAD  . PR LAP, SPLENECTOMY  01/07/2016  . TRACHEOSTOMY TUBE PLACEMENT N/A 02/22/2016   Procedure: TRACHEOSTOMY;  Surgeon: [name], MD;  Location: [facility] OR;  Service: ENT;  Laterality: N/A;  . TRACHEOSTOMY TUBE PLACEMENT N/A 01/14/2017   Procedure: TRACHEOSTOMY;  Surgeon: [name], MD;  Location: [facility] OR;  Service: ENT;  Laterality: N/A;  inserted #[id number] UEA#[id number]    Social History Social History   Social History  . Marital status: Single    Spouse name: N/A    . Number of children: N/A  . Years of education: N/A   Occupational History  . Not on file.   Social History Main Topics  . Smoking status: Former Smoker    Packs/day: 2.00    Years: 38.00    Types: Cigarettes  . Smokeless tobacco: Never Used     Comment: quit spring of 2017.  Marland Kitchen Alcohol use No  . Drug use: No  . Sexual activity: Not on file   Other Topics Concern  . Not on file   Social History Narrative   Lives @ SNF since Spring 2017.  Sedentary.  Unsteady on his feet.  Falls about 1x/wk.     Allergies  Allergen Reactions  . Codeine Anaphylaxis and Nausea And Vomiting  . Lisinopril Itching and Rash  . Hydrocodone Other (See Comments)    On MAR  . Hydrocodone-Acetaminophen Nausea And Vomiting    Cannot take any acetaminophen due to liver issues per sister  . Tegaderm Ag Mesh [Silver] Other (See Comments)    On MAR  . Tylenol [Acetaminophen] Nausea Only and Other (See Comments)    Cannot take any acetaminophen due to liver issues per sister    Family History  Problem Relation Age of Onset  . Alcohol abuse Mother   . Hypertension Mother   . Coronary artery disease Mother   . Alcohol abuse Father   . Hypertension Father   . Coronary artery disease Father   . Alcohol abuse Maternal Uncle       Prior to Admission medications   Medication Sig Start Date End Date Taking? Authorizing Provider  Amino Acids-Protein Hydrolys (FEEDING SUPPLEMENT, PRO-STAT SUGAR FREE 64,) LIQD Place 60 mLs into feeding tube 2 (two) times daily.     [provider]  atorvastatin (LIPITOR) 40 MG tablet Place 40 mg into feeding tube at bedtime. for Lipids    [provider]  bacitracin 500 UNIT/GM ointment Apply 1 application topically See admin instructions. Apply to left hip topically every day shift for wound care - cover with dry dressing    [provider]  cyanocobalamin 500 MCG tablet Place 1,000 mcg into feeding tube daily.    [provider]   famotidine (PEPCID) 20 MG tablet Place 20 mg into feeding tube 2 (two) times daily. for GERD    [provider]  folic acid (FOLVITE) 1 MG tablet Place 1 mg into feeding tube daily.     [provider]  guaiFENesin 200 MG tablet Place 200 mg into feeding tube 3 (three) times daily. for CHF    [provider]  lidocaine (LIDODERM) 5 % Place 1 patch onto the skin daily. Remove & Discard patch within 12 hours or as directed by MD 12/13/16   Leroy Sea, MD  metoCLOPramide [facility])  5 MG tablet Place 5 mg into feeding tube See admin instructions. Ordered 03/16/17 - 1 tablet (5 mg) per tube every 8 hours for GERD for 3 days 03/17/17 03/19/17  [provider]  metoprolol tartrate (LOPRESSOR) 25 MG tablet Place 25 mg into feeding tube 2 (two) times daily.    [provider]  Multiple Vitamin (MULTIVITAMIN WITH MINERALS) TABS tablet Place 1 tablet into feeding tube daily.    [provider]  Nutritional Supplements (FEEDING SUPPLEMENT, OSMOLITE 1.5 CAL,) LIQD Give 62cc/hr continuous via PEG-Tube every shift    [provider]  OLANZapine (ZYPREXA) 5 MG tablet Place 5 mg into feeding tube 2 (two) times daily. Give 1 tablet via G-tube two times daily     [provider]  Omega-3 Fatty Acids (FISH OIL) 1000 MG CAPS Place 1,000 mg into feeding tube daily.    [provider]  oxyCODONE-acetaminophen (PERCOCET/ROXICET) 5-325 MG tablet Give 1 tablet via G-tube every 8 hours for pain 04/04/17   Sharon Seller, NP  OXYGEN See admin instructions. Oxygen at 28% via trach cuff every shift    [provider]  polyethylene glycol (MIRALAX / GLYCOLAX) packet Place 17 g into feeding tube daily. for constipation    [provider]  senna (SENOKOT) 8.6 MG TABS tablet Place 1 tablet into feeding tube 2 (two) times daily. For constipation    [provider]  tamsulosin (FLOMAX) 0.4 MG CAPS capsule Give 1 capsule via  PEG-Tube one time daily    [provider]  UNABLE TO FIND HSG NPO (Nothing by mouth). Resident is NPO    [provider]  warfarin (COUMADIN) 6 MG tablet Place 6 mg into feeding tube every evening.     [provider]  Water For Irrigation, Sterile (FREE WATER) SOLN Place 150 mLs into feeding tube every 4 (four) hours. 03/30/17   Richarda Overlie, MD    Physical Exam:  Vitals:   04/07/17 1300 04/07/17 1315 04/07/17 1330 04/07/17 1345  BP: 112/75 112/80 102/67 115/67  Pulse: 89 (!) 103 (!) 110 (!) 113  Resp: 18 18 20  (!) 24  Temp:      TempSrc:      SpO2: 95% 96% 94% (!) 88%   Constitutional: NAD, ill appearing  Eyes: PERRL, lids and conjunctivae normal ENMT: Mucous membranes are moist, without exudate or lesions  Neck: trach site without blood or thick secretions  Respiratory: cRight side rhonchi, decreased breath sounds on the left t  Cardiovascular: Regular rate and rhythm, no murmurs, rubs or gallops. No extremity edema. 2+ pedal pulses. No carotid bruits.  Abdomen: Soft, non tender, No hepatosplenomegaly. Bowel sounds positive. + Gtube site  Musculoskeletal: no clubbing / cyanosis. Moves all extremities Skin: no jaundice, No lesions.  Neurologic: Sensation intact  Bilateral lower extremities contracted, RUE 4/5, BLE paralysis LUE weakness, Psychiatric:   Alert and oriented x 3. Normal mood.     Labs on Admission: I have personally reviewed following labs and imaging studies  CBC:  Recent Labs Lab 04/07/17 1243 04/07/17 1426  WBC 25.3*  --   NEUTROABS 13.9*  --   HGB 4.6* 11.6*  HCT 14.7* 34.0*  MCV 101.4*  --   PLT 389  --     Basic Metabolic Panel:  Recent Labs Lab 04/07/17 1243 04/07/17 1426  NA 143 146*  K 4.0 3.7  CL 107 109  CO2 27  --   GLUCOSE 102* 98  BUN 52* 48*  CREATININE 2.59* 2.60*  CALCIUM 9.7  --     GFR: Estimated Creatinine Clearance: 34.3 mL/min (A) (by C-G formula based on SCr of 2.6 mg/dL (H)).  Liver  Function Tests:  Recent Labs Lab 04/07/17 1243  AST 38  ALT 28  ALKPHOS 109  BILITOT 0.6  PROT 8.4*  ALBUMIN 2.4*   No results for input(s): LIPASE, AMYLASE in the last 168 hours. No results for input(s): AMMONIA in the last 168 hours.  Coagulation Profile:  Recent Labs Lab 04/07/17 1243  INR 2.72    Cardiac Enzymes: No results for input(s): CKTOTAL, CKMB, CKMBINDEX, TROPONINI in the last 168 hours.  BNP (last 3 results) No results for input(s): PROBNP in the last 8760 hours.  HbA1C: No results for input(s): HGBA1C in the last 72 hours.  CBG: No results for input(s): GLUCAP in the last 168 hours.  Lipid Profile: No results for input(s): CHOL, HDL, LDLCALC, TRIG, CHOLHDL, LDLDIRECT in the last 72 hours.  Thyroid Function Tests: No results for input(s): TSH, T4TOTAL, FREET4, T3FREE, THYROIDAB in the last 72 hours.  Anemia Panel: No results for input(s): VITAMINB12, FOLATE, FERRITIN, TIBC, IRON, RETICCTPCT in the last 72 hours.  Urine analysis:    Component Value Date/Time   COLORURINE YELLOW 12/21/2016 0600   APPEARANCEUR HAZY (A) 12/21/2016 0600   LABSPEC 1.021 12/21/2016 0600   PHURINE 5.0 12/21/2016 0600   GLUCOSEU NEGATIVE 12/21/2016 0600   HGBUR LARGE (A) 12/21/2016 0600   BILIRUBINUR NEGATIVE 12/21/2016 0600   KETONESUR NEGATIVE 12/21/2016 0600   PROTEINUR 100 (A) 12/21/2016 0600   NITRITE NEGATIVE 12/21/2016 0600   LEUKOCYTESUR NEGATIVE 12/21/2016 0600    Sepsis Labs: @LABRCNTIP (procalcitonin:4,lacticidven:4) )No results found for this or any previous visit (from the past 240 hour(s)).   Radiological Exams on Admission: Dg Chest Port 1 View  Result Date: 04/07/2017 CLINICAL DATA:  Short of breath fever EXAM: PORTABLE CHEST 1 VIEW COMPARISON:  03/27/2017 FINDINGS: Tracheostomy remains in good position. Bibasilar airspace disease is mildly improved. Negative for heart failure or effusion. IMPRESSION: Partial clearing of bibasilar  atelectasis/infiltrate. Electronically Signed   By: Marlan Palau M.D.   On: 04/07/2017 13:01    EKG: Independently reviewed.  Assessment/Plan Active Problems:   Acute and chronic respiratory failure with hypoxia (HCC)   Sepsis (HCC)   Heart failure, chronic systolic (HCC)   Hypertension   Depression   Status post tracheostomy (HCC)   Cardiomyopathy (HCC)   Apical mural thrombus   Protein calorie malnutrition (HCC)   S/P splenectomy   PEG (percutaneous endoscopic gastrostomy) status (HCC)   MRSA (methicillin resistant Staphylococcus aureus) septicemia (HCC)   Aspiration pneumonia (HCC)   Dysphagia   Acute blood loss anemia   Hyperlipidemia   Chronic anticoagulation   COPD (chronic obstructive pulmonary disease) (HCC)   GERD (gastroesophageal reflux disease)   Diabetes mellitus, type II, insulin dependent (HCC)   Chronic respiratory failure (HCC)   Sepsis likely due to respiratory source, organism unknown in the setting of recent admission for aspiration pneumonia, with acute on chornic respiratory failure with hypoxia   Patient meets criteria given tachycardia, fever, leukocytosis Lactic acid reassuring   Antibiotics delivered in the ED with Vanc and Zosyn . UA pending  EKG Sinus Tach,  .Chest x-ray suggest clearing of bibasilar atelectasis/ infiltrate  Will place patient on oxygen and titrate O2 as needed   Admit to SDU Sepsis order set  IV antibiotics by pharmacy with Cefepime and Vanco  Follow lactic acid  Follow  blood cultures IV fluids at 100 cc/h.  Procalcitonin order set  Mucinex prn  antipyretics Repeat CBC in am NPO to prevent further aspiration Aggressive pulmonary toilet by RT Aspiration precautions   Chronic systolic  CHF, appears compensated    Last 2 D echo with EF 25 to 30%, does not appear volume overloaded. Chest x-ray not show any acute CHF changes such as effusion. Weight 204 pounds  did not receive Lasix in the ED telemetry IV Lasix as needed,  cautiously, in view of his worsening kidney functions monitor I/Os and daily weights    History of LV apical mural thrombus,chronic anticoagulation with Coumadin. Coumadin would be held, in view of severe dropping his hemoglobin place the patient and his CDs in the interim. Check PT/ INR   Hypertension BP 120/72  Pulse 96  Controlled Continue home anti-hypertensive medications    Hyperlipidemia Continue home statins   Anemia, severe acute on chronic  Hemoglobin on admission 4.6, baseline 10 .Hcult pending. History of AAA without rupture . Received 3 units of blood  Stat CT abdomen and pelvis rule out dissection  Repeat CBC in am  Continue to support with transfusion to maintain Hb 8 or above or if acutely bleeding    Acute on Chronic kidney disease stage  baseline creatinine 2.1, current at 2.6    Lab Results  Component Value Date   CREATININE 2.60 (H) 04/07/2017   CREATININE 2.59 (H) 04/07/2017   CREATININE 2.18 (H) 03/30/2017  Gentle IVF Hold diuretics  Repeat CMET in am Continue free water by PEG If continues to worsen, will consider Renal consult   Mood disorder/ Dementia . Mental status at baseline Continue Zyprexa  History of CVA with residual dual L hemiparesis  Coumadin on hold due to possible acute bleed, currently on SCD   Hyperglycemia without a diagnosis of DM. Current Glu 102. Last A1C 5.3  Continue to monitor   History of NSVT Continue metoprolol . NO ACE I due to renal insufficiency  Deconditioning PT/ OT consult   DVT prophylaxis:  SCD's.  Coumadin on hold   Code Status:   Full   Family Communication:  Discussed with patient and sister  Disposition Plan: Expect patient to be discharged to SNF after condition improves Consults called:   None  Admission status:  SDU    [facility] E, PA-C [facility]   04/07/2017, 3:11 PM

## 2017-04-08 ENCOUNTER — Inpatient Hospital Stay (HOSPITAL_COMMUNITY): Payer: Medicare Other

## 2017-04-08 DIAGNOSIS — Z4682 Encounter for fitting and adjustment of non-vascular catheter: Secondary | ICD-10-CM | POA: Diagnosis not present

## 2017-04-08 DIAGNOSIS — D649 Anemia, unspecified: Secondary | ICD-10-CM | POA: Diagnosis not present

## 2017-04-08 DIAGNOSIS — J9503 Malfunction of tracheostomy stoma: Secondary | ICD-10-CM

## 2017-04-08 DIAGNOSIS — A419 Sepsis, unspecified organism: Secondary | ICD-10-CM | POA: Diagnosis not present

## 2017-04-08 DIAGNOSIS — Z8679 Personal history of other diseases of the circulatory system: Secondary | ICD-10-CM | POA: Diagnosis not present

## 2017-04-08 DIAGNOSIS — J9621 Acute and chronic respiratory failure with hypoxia: Secondary | ICD-10-CM | POA: Diagnosis not present

## 2017-04-08 LAB — COMPREHENSIVE METABOLIC PANEL
ALBUMIN: 2 g/dL — AB (ref 3.5–5.0)
ALT: 26 U/L (ref 17–63)
AST: 34 U/L (ref 15–41)
Alkaline Phosphatase: 105 U/L (ref 38–126)
Anion gap: 8 (ref 5–15)
BILIRUBIN TOTAL: 0.8 mg/dL (ref 0.3–1.2)
BUN: 50 mg/dL — AB (ref 6–20)
CHLORIDE: 108 mmol/L (ref 101–111)
CO2: 26 mmol/L (ref 22–32)
CREATININE: 2.43 mg/dL — AB (ref 0.61–1.24)
Calcium: 9.3 mg/dL (ref 8.9–10.3)
GFR calc Af Amer: 31 mL/min — ABNORMAL LOW (ref >60)
GFR, EST NON AFRICAN AMERICAN: 27 mL/min — AB (ref >60)
GLUCOSE: 146 mg/dL — AB (ref 65–99)
POTASSIUM: 4 mmol/L (ref 3.5–5.1)
Sodium: 142 mmol/L (ref 135–145)
Total Protein: 7.5 g/dL (ref 6.5–8.1)

## 2017-04-08 LAB — CBC
HCT: 39.9 % (ref 39.0–52.0)
Hemoglobin: 12.7 g/dL — ABNORMAL LOW (ref 13.0–17.0)
MCH: 30.8 pg (ref 26.0–34.0)
MCHC: 31.8 g/dL (ref 30.0–36.0)
MCV: 96.8 fL (ref 78.0–100.0)
PLATELETS: 220 10*3/uL (ref 150–400)
RBC: 4.12 MIL/uL — ABNORMAL LOW (ref 4.22–5.81)
RDW: 17.2 % — AB (ref 11.5–15.5)
WBC: 11.3 10*3/uL — AB (ref 4.0–10.5)

## 2017-04-08 LAB — TYPE AND SCREEN
ABO/RH(D): A POS
ANTIBODY SCREEN: NEGATIVE
UNIT DIVISION: 0
Unit division: 0
Unit division: 0

## 2017-04-08 LAB — GLUCOSE, CAPILLARY
GLUCOSE-CAPILLARY: 148 mg/dL — AB (ref 65–99)
GLUCOSE-CAPILLARY: 148 mg/dL — AB (ref 65–99)
GLUCOSE-CAPILLARY: 162 mg/dL — AB (ref 65–99)
GLUCOSE-CAPILLARY: 171 mg/dL — AB (ref 65–99)
Glucose-Capillary: 223 mg/dL — ABNORMAL HIGH (ref 65–99)

## 2017-04-08 LAB — BPAM RBC
BLOOD PRODUCT EXPIRATION DATE: 201807022359
Blood Product Expiration Date: 201807022359
Blood Product Expiration Date: 201807022359
ISSUE DATE / TIME: 201806151530
ISSUE DATE / TIME: 201806151952
ISSUE DATE / TIME: 201806152242
UNIT TYPE AND RH: 6200
Unit Type and Rh: 6200
Unit Type and Rh: 6200

## 2017-04-08 LAB — URINE CULTURE: Culture: NO GROWTH

## 2017-04-08 LAB — PROTIME-INR
INR: 2.95
Prothrombin Time: 31.3 seconds — ABNORMAL HIGH (ref 11.4–15.2)

## 2017-04-08 MED ORDER — ACETYLCYSTEINE 20 % IN SOLN
4.0000 mL | Freq: Two times a day (BID) | RESPIRATORY_TRACT | Status: DC
  Administered 2017-04-09 – 2017-04-13 (×10): 4 mL via RESPIRATORY_TRACT
  Filled 2017-04-08 (×12): qty 4

## 2017-04-08 MED ORDER — IPRATROPIUM-ALBUTEROL 0.5-2.5 (3) MG/3ML IN SOLN
3.0000 mL | Freq: Four times a day (QID) | RESPIRATORY_TRACT | Status: DC
  Administered 2017-04-08 – 2017-04-09 (×6): 3 mL via RESPIRATORY_TRACT
  Filled 2017-04-08 (×6): qty 3

## 2017-04-08 MED ORDER — ALBUTEROL SULFATE (2.5 MG/3ML) 0.083% IN NEBU
2.5000 mg | INHALATION_SOLUTION | RESPIRATORY_TRACT | Status: DC | PRN
  Administered 2017-04-08: 2.5 mg via RESPIRATORY_TRACT
  Filled 2017-04-08: qty 3

## 2017-04-08 MED ORDER — INSULIN ASPART 100 UNIT/ML ~~LOC~~ SOLN
0.0000 [IU] | SUBCUTANEOUS | Status: DC
  Administered 2017-04-09 (×2): 2 [IU] via SUBCUTANEOUS
  Administered 2017-04-09 (×2): 1 [IU] via SUBCUTANEOUS
  Administered 2017-04-10: 2 [IU] via SUBCUTANEOUS
  Administered 2017-04-10: 1 [IU] via SUBCUTANEOUS
  Administered 2017-04-10 (×3): 2 [IU] via SUBCUTANEOUS
  Administered 2017-04-11: 1 [IU] via SUBCUTANEOUS
  Administered 2017-04-11: 2 [IU] via SUBCUTANEOUS
  Administered 2017-04-11 – 2017-04-12 (×8): 1 [IU] via SUBCUTANEOUS
  Administered 2017-04-13 (×2): 2 [IU] via SUBCUTANEOUS
  Administered 2017-04-13: 1 [IU] via SUBCUTANEOUS
  Administered 2017-04-13: 2 [IU] via SUBCUTANEOUS

## 2017-04-08 MED ORDER — ACETYLCYSTEINE 10 % IN SOLN: 4.0000 mL | Freq: Two times a day (BID) | RESPIRATORY_TRACT | Status: DC

## 2017-04-08 NOTE — Progress Notes (Signed)
@IPLOG         PROGRESS NOTE                                                                                                                                                                                                             Patient Demographics:    Curtis Keller, is a 61 y.o. male, DOB - 1956/08/21, ZOX:[id number]  Admit date - 04/07/2017   Admitting Physician Clydie Braun, MD  Outpatient Primary MD for the patient is [facility]  LOS - 1  Chief Complaint  Patient presents with  . Fever  . Shortness of Breath       Brief Narrative   Curtis Keller is a 61 y.o. male with extensive medical history listed below, including tracheostomy dependent, on oxygen via trach, dysphagia,  status post PEG tube feeding, COPD, CVA in February 2017 with residual L hemiparesis/contracturs,  chronic systolic CHF, ICM with EF  25-30 %, CKD 3, chronic anemia, dementia, recurrent falls, recent heat fracture surgery, a history of LV apical thrombosis initially on  Eliquis then switched towarfarin due to development of CVA , history od AAA without rupture as of last discharge, Hep C and recent admission for aspiration pneumonia with hypoxia discharged on 03/30/2017 to SNF, was readmitted on 04/07/2017 for fever and shortness of breath due to pneumonia.   Subjective:    Liban Guedes today has, No headache, No chest pain, No abdominal pain - No Nausea, No new weakness tingling or numbness, No Cough - Mild SOB.     Assessment  & Plan :     1.Recurrent pneumonia with acute on chronic hypoxic respiratory failure , no sepsis today. Patient likely has recurrent aspiration, despite being nothing by mouth and on PEG tube feeds he probably is aspirating his oral secretions and his PEG input regurgitation. Currently on empiric IV antibiotics and improving, continue supportive care with oxygen and nebulizer treatments, continue nothing by mouth, elevate head of the bed, continue trach  collar and monitor.  2. Chronic systolic heart failure EF 25%, history of LV apical thrombus. Continue beta blocker via PEG tube, no ACE/ARB/Entresto due to renal insufficiency, continue Coumadin. Currently seems to be compensated from CHF standpoint.  3. CVA and stroke. Dense left-sided hemiparesis and dysphagia, continue Coumadin and statin for secondary prevention. SNF upon discharge.  4. Dyslipidemia. On statin.  5. Dysphagia. For now nothing by mouth and PEG tube feeds, will request patient to evaluate one time.  6. Essential hypertension. Continue beta blocker.  7. Anemia of chronic disease. Stable continue to monitor, there was question that his hemoglobin was 4.6 upon admission I think that was a lab.  8. Mild acute renal failure on chronic kidney disease stage IV. Baseline creatinine around 2.4. With gentle hydration back to baseline.  9. Dementia and delirium. Continue Zyprexa.  10. History of nonsustained ventricular tachycardia. EF less than 50%. For now on beta blocker continue.  11. COPD. No wheezing. Supportive care.    Diet : Diet NPO time specified    Family Communication  : None  Code Status :  Ful  Disposition Plan  :  Step Down  Consults  : Requested [facility] and evaluate his trach.  Procedures  :     CT abdomen and pelvis. Nonacute.  DVT Prophylaxis  : Coumadin  Lab Results  Component Value Date   INR 2.95 04/08/2017   INR 2.72 04/07/2017   INR 2.9 (A) 03/31/2017   PROTIME 30.2 (A) 03/31/2017   PROTIME 25.4 (A) 07/28/2016   PROTIME 28.5 (A) 07/13/2016     Lab Results  Component Value Date   PLT 220 04/08/2017    Inpatient Medications  Scheduled Meds: . atorvastatin  40 mg Per Tube QHS  . chlorhexidine  15 mL Mouth Rinse BID  . famotidine  20 mg Per Tube Q24H  . feeding supplement (PRO-STAT SUGAR FREE 64)  60 mL Per Tube BID  . folic acid  1 mg Per Tube Daily  . free water  150 mL Per Tube Q4H  . guaiFENesin  200 mg Per Tube TID  .  mouth rinse  15 mL Mouth Rinse q12n4p  . metoprolol tartrate  25 mg Per Tube BID  . multivitamin with minerals  1 tablet Per Tube Daily  . OLANZapine  5 mg Per Tube BID  . cyanocobalamin  1,000 mcg Per Tube Daily  . warfarin  6 mg Oral q1800  . Warfarin - Physician Dosing Inpatient   Does not apply q1800   Continuous Infusions: . ceFEPime (MAXIPIME) IV Stopped (04/07/17 2357)  . feeding supplement (OSMOLITE 1.5 CAL) 1,000 mL (04/07/17 2117)  . vancomycin     PRN Meds:.bisacodyl, HYDROmorphone (DILAUDID) injection, ondansetron **OR** ondansetron (ZOFRAN) IV, polyethylene glycol, senna-docusate  Antibiotics  :    Anti-infectives    Start     Dose/Rate Route Frequency Ordered Stop   04/08/17 2100  vancomycin (VANCOCIN) 1,250 mg in sodium chloride 0.9 % 250 mL IVPB     1,250 mg 166.7 mL/hr over 90 Minutes Intravenous Every 24 hours 04/07/17 1951     04/08/17 1500  vancomycin (VANCOCIN) 1,250 mg in sodium chloride 0.9 % 250 mL IVPB  Status:  Discontinued     1,250 mg 166.7 mL/hr over 90 Minutes Intravenous Every 24 hours 04/07/17 1542 04/07/17 1951   04/08/17 1400  vancomycin (VANCOCIN) 1,250 mg in sodium chloride 0.9 % 250 mL IVPB  Status:  Discontinued     1,250 mg 166.7 mL/hr over 90 Minutes Intravenous Every 24 hours 04/07/17 1446 04/07/17 1542   04/07/17 2300  ceFEPIme (MAXIPIME) 1 g in dextrose 5 % 50 mL IVPB     1 g 100 mL/hr over 30 Minutes Intravenous Every 24 hours 04/07/17 1536     04/07/17 1500  vancomycin (VANCOCIN) 1,500 mg in sodium chloride 0.9 % 500 mL IVPB     1,500 mg 250 mL/hr over 120 Minutes Intravenous  Once 04/07/17 1446 04/07/17 2257   04/07/17 1445  piperacillin-tazobactam (ZOSYN) IVPB 2.25  g  Status:  Discontinued     2.25 g 100 mL/hr over 30 Minutes Intravenous  Once 04/07/17 1433 04/07/17 1438   04/07/17 1445  piperacillin-tazobactam (ZOSYN) IVPB 3.375 g     3.375 g 100 mL/hr over 30 Minutes Intravenous  Once 04/07/17 1439 04/07/17 1537          Objective:   Vitals:   04/08/17 0746 04/08/17 0757 04/08/17 0800 04/08/17 0900  BP:   (!) 126/99 118/89  Pulse:   76 70  Resp:   (!) 21 18  Temp: 98.4 F (36.9 C)     TempSrc: Oral     SpO2:  97% 96% 95%  Weight:      Height:        Wt Readings from Last 3 Encounters:  04/08/17 94.1 kg (207 lb 7.3 oz)  04/04/17 92.5 kg (204 lb)  04/03/17 92.5 kg (204 lb)     Intake/Output Summary (Last 24 hours) at 04/08/17 0943 Last data filed at 04/08/17 0900  Gross per 24 hour  Intake          2867.43 ml  Output              500 ml  Net          2367.43 ml     Physical Exam  Awake Alert, Dense left-sided hemiparesis, PEG tube in place, wearing trach collar Kirk.AT,PERRAL Supple Neck,No JVD, No cervical lymphadenopathy appriciated.  Symmetrical Chest wall movement, Good air movement bilaterally, bibasilar rales left more than right RRR,No Gallops,Rubs or new Murmurs, No Parasternal Heave +ve B.Sounds, Abd Soft, No tenderness, No organomegaly appriciated, No rebound - guarding or rigidity. No Cyanosis, Clubbing or edema, No new Rash or bruise      Data Review:    CBC  Recent Labs Lab 04/07/17 1243 04/07/17 1426 04/08/17 0429  WBC 25.3*  --  11.3*  HGB 4.6* 11.6* 12.7*  HCT 14.7* 34.0* 39.9  PLT 389  --  220  MCV 101.4*  --  96.8  MCH 31.7  --  30.8  MCHC 31.3  --  31.8  RDW 15.0  --  17.2*  LYMPHSABS 7.6*  --   --   MONOABS 1.3*  --   --   EOSABS 2.5*  --   --   BASOSABS 0.0  --   --     Chemistries   Recent Labs Lab 04/07/17 1243 04/07/17 1426 04/08/17 0429  NA 143 146* 142  K 4.0 3.7 4.0  CL 107 109 108  CO2 27  --  26  GLUCOSE 102* 98 146*  BUN 52* 48* 50*  CREATININE 2.59* 2.60* 2.43*  CALCIUM 9.7  --  9.3  AST 38  --  34  ALT 28  --  26  ALKPHOS 109  --  105  BILITOT 0.6  --  0.8   ------------------------------------------------------------------------------------------------------------------ No results for input(s): CHOL, HDL, LDLCALC,  TRIG, CHOLHDL, LDLDIRECT in the last 72 hours.  Lab Results  Component Value Date   HGBA1C 5.3 03/22/2017   ------------------------------------------------------------------------------------------------------------------ No results for input(s): TSH, T4TOTAL, T3FREE, THYROIDAB in the last 72 hours.  Invalid input(s): FREET3 ------------------------------------------------------------------------------------------------------------------ No results for input(s): VITAMINB12, FOLATE, FERRITIN, TIBC, IRON, RETICCTPCT in the last 72 hours.  Coagulation profile  Recent Labs Lab 04/07/17 1243 04/08/17 0429  INR 2.72 2.95    No results for input(s): DDIMER in the last 72 hours.  Cardiac Enzymes No results for input(s): CKMB, TROPONINI, MYOGLOBIN in the  last 168 hours.  Invalid input(s): CK ------------------------------------------------------------------------------------------------------------------    Component Value Date/Time   BNP 199.5 (H) 03/30/2017 1610    Micro Results No results found for this or any previous visit (from the past 240 hour(s)).  Radiology Reports Ct Abdomen Pelvis Wo Contrast  Result Date: 04/07/2017 CLINICAL DATA:  Anemia history of aortic aneurysm EXAM: CT ABDOMEN AND PELVIS WITHOUT CONTRAST TECHNIQUE: Multidetector CT imaging of the abdomen and pelvis was performed following the standard protocol without IV contrast. COMPARISON:  01/30/2017 FINDINGS: Lower chest: Patchy bibasilar opacities. No pleural effusion. Cardiomegaly with coronary artery calcification. Calcifications at the ventricular apex may relate to old infarct. Hepatobiliary: No focal hepatic abnormality. Possible small stones or sludge in the gallbladder. No biliary dilatation Pancreas: Unremarkable. No pancreatic ductal dilatation or surrounding inflammatory changes. Spleen: Small splenic tissue in the left upper quadrant with adjacent surgical change or calcification. Adrenals/Urinary  Tract: Adrenal glands are within normal limits. Punctate nonobstructing stone in the mid right kidney. No hydronephrosis. The bladder is unremarkable Stomach/Bowel: Stomach is nonenlarged. There is a gastrostomy tube. No dilated small bowel. No colon wall thickening. Vascular/Lymphatic: Atherosclerotic vascular disease. No aneurysmal dilatation. No significantly enlarged lymph nodes. Reproductive: Prostate glands calcifications. Other: Ventral hernia containing mesenteric fat and small amount of bowel. No free air or free fluid Musculoskeletal: Status post left hip replacement with associated artifact. Probable old fracture left inferior pubic ramus. Old right-sided rib fractures. IMPRESSION: 1. Aortic atherosclerotic vascular disease but no aneurysmal dilatation. No evidence for retroperitoneal mass or hematoma. 2. Patchy bibasilar lung opacities, not significantly changed 3. Punctate nonobstructing stone in the mid right kidney 4. Possible small stones or sludge in the gallbladder 5. Ventral hernia containing mesenteric fat and small amount of bowel but no evidence for an obstruction Electronically Signed   By: Jasmine Pang M.D.   On: 04/07/2017 22:43   Dg Chest 2 View  Result Date: 03/17/2017 CLINICAL DATA:  61 year old with chronic ventilator dependent respiratory failure presenting with cough. Current history of chronic systolic congestive heart failure, hypertension, cardiomyopathy and COPD. EXAM: CHEST  2 VIEW 3:48 p.m.: COMPARISON:  Portable chest x-ray earlier today 1:43 p.m., 03/06/2017 and earlier. FINDINGS: AP erect and lateral images were obtained. Tracheostomy tube tip in satisfactory position below the thoracic inlet projecting approximately 5 cm above carina. Patchy airspace opacities in the lung bases, left greater than right, similar to the examination earlier today, new since 03/06/2017. Slight improvement in the interstitial pulmonary edema since earlier today. No new pulmonary parenchymal  abnormalities since earlier today. IMPRESSION: 1. Tracheostomy tube tip in satisfactory position approximately 5 cm above carina. 2. Pneumonia involving the lung bases, left greater than right. 3. Improving interstitial pulmonary edema. Electronically Signed   By: Hulan Saas M.D.   On: 03/17/2017 16:14   Ct Chest Wo Contrast  Result Date: 03/29/2017 CLINICAL DATA:  Shortness of breath, history respiratory failure requiring increased oxygen usage today, tracheostomy dependent, denies chest pain EXAM: CT CHEST WITHOUT CONTRAST TECHNIQUE: Multidetector CT imaging of the chest was performed following the standard protocol without IV contrast. Sagittal and coronal MPR images reconstructed from axial data set. COMPARISON:  12/23/2016 FINDINGS: Cardiovascular: Atherosclerotic calcifications aorta, proximal great vessels, and coronary arteries ascending aorta aneurysmally dilated 4.5 cm transverse image 62. No pericardial effusion. Calcifications at cardiac apex question prior MI. Mediastinum/Nodes: Esophagus unremarkable. Tracheostomy tube within trachea. Scattered normal sized mediastinal lymph nodes largest 10 mm RIGHT paratracheal node image 47 and 10 mm para-aortic node image 53.  Base of cervical region unremarkable. Lungs/Pleura: Scattered peripheral chronic interstitial infiltrates in both lungs. Dependent atelectasis in both lower lobes improved since previous exam. Central peribronchial thickening greatest in lower lobes. Scattered subpleural blebs at the apices greater on RIGHT. RIGHT upper lobe nodular density 9 mm diameter image 42 unchanged. No pleural effusion or pneumothorax. Upper Abdomen: Gastrostomy tube within collapsed stomach. Post splenectomy with question splenule at splenectomy bed. Visualized upper abdomen otherwise unremarkable. Musculoskeletal: Old appearing compression deformity of an upper thoracic vertebra approximately T6. Bones demineralized. No other focal bony abnormalities.  IMPRESSION: Aortic atherosclerosis and coronary arterial calcifications aneurysmal dilatation of the ascending thoracic aorta 4.5 cm transverse, recommendation below. Ascending thoracic aortic aneurysm. Recommend semi-annual imaging followup by CTA or MRA and referral to cardiothoracic surgery if not already obtained. This recommendation follows 2010 ACCF/AHA/AATS/ACR/ASA/SCA/SCAI/SIR/STS/SVM Guidelines for the Diagnosis and Management of Patients With Thoracic Aortic Disease. Circulation. 2010; 121: Z610-R604 Mural calcification at cardiac apex question prior MI. Bronchitic changes with scattered chronic peripheral interstitial infiltrates in both lungs and improved bibasilar atelectasis. No definite acute infiltrate. Stable 9 mm RIGHT upper lobe nodule. Upper normal sized mediastinal lymph nodes without definite thoracic adenopathy. Electronically Signed   By: Ulyses Southward M.D.   On: 03/29/2017 12:45   Dg Chest Port 1 View  Result Date: 04/08/2017 CLINICAL DATA:  Sepsis EXAM: PORTABLE CHEST 1 VIEW COMPARISON:  04/07/2017 FINDINGS: Left lower lobe airspace disease unchanged. Mild right upper lobe airspace disease has progressed. Negative for effusion. Tracheostomy remains in good position. IMPRESSION: Left lower lobe airspace disease unchanged. Progression of mild right upper lobe airspace disease. Possible pneumonia. Electronically Signed   By: Marlan Palau M.D.   On: 04/08/2017 09:34   Dg Chest Port 1 View  Result Date: 04/07/2017 CLINICAL DATA:  Short of breath fever EXAM: PORTABLE CHEST 1 VIEW COMPARISON:  03/27/2017 FINDINGS: Tracheostomy remains in good position. Bibasilar airspace disease is mildly improved. Negative for heart failure or effusion. IMPRESSION: Partial clearing of bibasilar atelectasis/infiltrate. Electronically Signed   By: Marlan Palau M.D.   On: 04/07/2017 13:01   Dg Chest Port 1 View  Result Date: 03/27/2017 CLINICAL DATA:  Shortness of Breath EXAM: PORTABLE CHEST 1 VIEW  COMPARISON:  March 24, 2017 FINDINGS: Tracheostomy catheter tip is 4.5 cm above the carina. No pneumothorax. There is patchy airspace consolidation in the left base. There is mild bibasilar atelectasis as well. Heart is borderline enlarged with pulmonary vascularity within normal limits. No adenopathy. No bone lesions. IMPRESSION: Tracheostomy as described without pneumothorax. Patchy airspace opacity left base, suspect a degree of pneumonia. Patchy atelectasis in the bases as well. Stable cardiac silhouette. Electronically Signed   By: Bretta Bang III M.D.   On: 03/27/2017 09:32   Dg Chest Port 1 View  Result Date: 03/24/2017 CLINICAL DATA:  Hypoxia EXAM: PORTABLE CHEST 1 VIEW COMPARISON:  Two days ago FINDINGS: Tracheostomy tube in place. Cardiomegaly and diffuse interstitial opacity. Low lung volumes. Small layering pleural effusions are not excluded. No evidence of pneumothorax. IMPRESSION: 1. Cardiomegaly and mild pulmonary edema. 2. Low volume chest with atelectasis. 3. No change from study 2 days prior. Electronically Signed   By: Marnee Spring M.D.   On: 03/24/2017 14:47   Dg Chest Port 1 View  Result Date: 03/22/2017 CLINICAL DATA:  Hypoxia EXAM: PORTABLE CHEST 1 VIEW COMPARISON:  03/19/2017; 03/17/2017; 03/06/2017 FINDINGS: Grossly unchanged enlarged cardiac silhouette and mediastinal contours given AP projection. Stable position of support apparatus. Pulmonary vasculature remains indistinct with cephalization  of flow. Minimally improved aeration of lungs with persistent bibasilar opacities, left greater than right. No new focal airspace opacities. No definite pleural effusion or pneumothorax. No acute osseus abnormalities. IMPRESSION: 1.  Stable positioning of support apparatus.  No pneumothorax. 2. Minimally improved bibasilar atelectasis with otherwise similar findings of mild pulmonary edema. Electronically Signed   By: [name] M.D.   On: 03/22/2017 09:15   Dg Chest Port 1  View  Result Date: 03/19/2017 CLINICAL DATA:  Shortness of breath EXAM: PORTABLE CHEST 1 VIEW COMPARISON:  03/17/2017; 03/06/2017; 01/27/2017 FINDINGS: Unchanged enlarged cardiac silhouette and mediastinal contours. Stable position of support apparatus. Pulmonary vasculature appears less distinct with cephalization of flow. Interval development of trace right and small left-sided effusion with associated worsening bibasilar opacities, left greater than right. No pneumothorax. No acute osseus abnormalities. Old right anterior rib fractures. IMPRESSION: 1.  No acute cardiopulmonary disease. 2. Suspected worsening pulmonary edema and bibasilar atelectasis, left greater than right. Electronically Signed   By: [name] M.D.   On: 03/19/2017 13:40   Dg Chest Port 1 View  Result Date: 03/17/2017 CLINICAL DATA:  Shortness of breath EXAM: PORTABLE CHEST 1 VIEW COMPARISON:  03/06/2017, 02/20/2017, 02/08/2017 FINDINGS: Tracheostomy tube tip about 2.4 cm superior to the carina and projects over the tracheal air column. Cardiomegaly with central vascular congestion and mild interstitial edema. Streaky opacity at the left base may reflect atelectasis or an infiltrate. Old right clavicular fracture and old rib fractures. IMPRESSION: Tracheostomy tube tip projects over tracheal air column and tip is about 2.4 cm superior to carina Cardiomegaly with central vascular congestion and mild diffuse edema. Streaky atelectasis or pneumonia at the left base. Electronically Signed   By: Jasmine Pang M.D.   On: 03/17/2017 14:02    Time Spent in minutes  30   Susa Raring M.D on 04/08/2017 at 9:43 AM  Between 7am to 7pm - Pager - [phone] ( page via [email or web address], text pages only, please mention full 10 digit call back number). After 7pm go to [email or web address] - password [facility]

## 2017-04-08 NOTE — Plan of Care (Signed)
Was called by Dr Thedore Mins with concern that the trach got displaced based on what he heard from Rt.  On my exam, the trach looks in good position, patient is saturating 100% on 10L via trach collar, we are able tos uction tracheal secretions out easily.  I will get a CXR to look for positioning.  Call us back if needed.

## 2017-04-08 NOTE — Consult Note (Signed)
I was consulted to see patient as RT was unable to pass a suction catheter down his #4 Shiley uncuffed trach. They were questioning whether the trach was dislodged. On my review of his CXR from 1 hour prior, his trach is in his trachea and in good position. On exam, his vital signs are stable. Pox in low 90's on trach collar. Lungs CTA b/l in no respiratory distress. Janina Mayo is sutured in place and flush against the skin. It does not appear dislodged. There is thick copious secretions lining inside of trach. While discussing bronchoscopy and obtaining consent over the phone with patient's family member, RT was then able to finally pass suction catheter. They suctioned and removed thick large mucous plug. Patient stable throughout. He says he feels he can breathe through his trach. I notice he's already receiving Duonebs q6 hours. Will add Mucomyst nebs BID as well to hopefully thin the secretions.

## 2017-04-08 NOTE — Evaluation (Signed)
Physical Therapy Evaluation Patient Details Name: Curtis Keller MRN: [id number] DOB: Oct 19, 1956 Today's Date: 04/08/2017   History of Present Illness  Patient is a 61 y/o male who presents from [facility] with SOB, fever. Found to have sepsis and possibly PNA. PMH includes respiratory failure and trach dependent rquiring 02, dysphagia with PEG, COPD, CVA, contractures LUE/LE, CHF, falls, hip fx, LV apical thrombus, recently admitted 5/25 with hypoxia and PNA.   Clinical Impression  Patient presents with residual left sided hemiparesis and contractures from prior CVA, impaired sitting balance, decreased cognition and decreased overall functional mobility s/p above. Tolerated sitting EOB unsupported and standing with MAx A of 2 to chair sheets. Able to fully extend LUE passively but pain with extension of LLE. Pt from [facility]. Pt not a great historian but reports he is bedbound at SNF and requires assist for all ADls. Recommend return to SNF to maximize independence and mobility and ease burden of care.     Follow Up Recommendations SNF;Supervision/Assistance - 24 hour    Equipment Recommendations  None recommended by PT    Recommendations for Other Services OT consult     Precautions / Restrictions Precautions Precautions: Fall Precaution Comments: L hip hemi 02/10/17 from injury 2/18 Other Brace/Splint: Per last PT note, pt has soft posey knee spling to help keep knee straighter, not present today.  Restrictions Weight Bearing Restrictions: No      Mobility  Bed Mobility Overal bed mobility: Needs Assistance Bed Mobility: Supine to Sit     Supine to sit: HOB elevated;Max assist Sit to supine: Mod assist;+2 for physical assistance   General bed mobility comments: Able to use RUE to assist to pull up, help needed with trunk and to bring LLE off bed. Needs assist bringing LEs into bed.  Transfers Overall transfer level: Needs assistance Equipment used:  (back of  chair) Transfers: Sit to/from Stand Sit to Stand: Max assist;+2 physical assistance         General transfer comment: Assist of 2 to power to standing having RUE pull up on back of chair; Left knee stabilized by PT and cues for hip ext and upright.   Ambulation/Gait                Stairs            Wheelchair Mobility    Modified Rankin (Stroke Patients Only) Modified Rankin (Stroke Patients Only) Pre-Morbid Rankin Score: Severe disability Modified Rankin: Severe disability     Balance Overall balance assessment: Needs assistance Sitting-balance support: Feet supported;No upper extremity supported Sitting balance-Leahy Scale: Fair Sitting balance - Comments: Able to sit EOB without support while performing AROM BLEs.   Standing balance support: During functional activity Standing balance-Leahy Scale: Zero Standing balance comment: Requires Max A of 2 for static standing to change sheets supporting left knee from buckling.                             Pertinent Vitals/Pain Pain Assessment: Faces Faces Pain Scale: Hurts little more Pain Location: LLE with AROM Pain Descriptors / Indicators: Discomfort;Grimacing;Guarding Pain Intervention(s): Monitored during session;Repositioned;Limited activity within patient's tolerance    Home Living Family/patient expects to be discharged to:: Skilled nursing facility                 Additional Comments: [facility]    Prior Function Level of Independence: Needs assistance   Gait / Transfers  Assistance Needed: Per pt he is non ambulatory and stays in bed all day.  ADL's / Homemaking Assistance Needed: total A for ADLs        Hand Dominance   Dominant Hand: Right    Extremity/Trunk Assessment   Upper Extremity Assessment Upper Extremity Assessment: Defer to OT evaluation LUE Deficits / Details: tight with flexor tone and contracted fingers and shoulder.    Lower Extremity  Assessment Lower Extremity Assessment: LLE deficits/detail LLE Deficits / Details: increased flexor tone, unable to extend knee more than about 60 degrees due to pain, guarding, and limited hip extension, no active movement noted in foot LLE Sensation:  [facility])       Communication   Communication: Tracheostomy  Cognition Arousal/Alertness: Awake/alert Behavior During Therapy: Flat affect Overall Cognitive Status: Difficult to assess                                 General Comments: A&Ox1. Able to follow simple 1-2 step commands without difficulty.       General Comments General comments (skin integrity, edema, etc.): Pt on 10L 02 via trach.    Exercises     Assessment/Plan    PT Assessment Patient needs continued PT services  PT Problem List Decreased strength;Decreased range of motion;Decreased mobility;Decreased activity tolerance;Decreased balance;Decreased knowledge of use of DME;Pain;Impaired tone;Decreased cognition;Decreased coordination       PT Treatment Interventions DME instruction;Functional mobility training;Therapeutic activities;Therapeutic exercise;Balance training;Neuromuscular re-education;Patient/family education;Wheelchair mobility training;Cognitive remediation    PT Goals (Current goals can be found in the Care Plan section)  Acute Rehab PT Goals PT Goal Formulation: With patient Time For Goal Achievement: 04/22/17 Potential to Achieve Goals: Fair    Frequency Min 2X/week   Barriers to discharge        Co-evaluation               AM-PAC PT "6 Clicks" Daily Activity  Outcome Measure Difficulty turning over in bed (including adjusting bedclothes, sheets and blankets)?: Total Difficulty moving from lying on back to sitting on the side of the bed? : Total Difficulty sitting down on and standing up from a chair with arms (e.g., wheelchair, bedside commode, etc,.)?: Total Help needed moving to and from a bed to chair (including a  wheelchair)?: Total Help needed walking in hospital room?: Total Help needed climbing 3-5 steps with a railing? : Total 6 Click Score: 6    End of Session Equipment Utilized During Treatment: Oxygen (10L) Activity Tolerance: Patient tolerated treatment well;Patient limited by pain Patient left: in bed;with call bell/phone within reach;with nursing/sitter in room;with bed alarm set;with SCD's reapplied Nurse Communication: Mobility status;Need for lift equipment PT Visit Diagnosis: Hemiplegia and hemiparesis;Pain;Unsteadiness on feet (R26.81);Other abnormalities of gait and mobility (R26.89) Hemiplegia - Right/Left: Left Hemiplegia - dominant/non-dominant: Non-dominant Pain - Right/Left: Left Pain - part of body: Leg    Time: 8325-4982 PT Time Calculation (min) (ACUTE ONLY): 19 min   Charges:   PT Evaluation $PT Eval Moderate Complexity: 1 Procedure     PT G Codes:        Mylo Red, PT, [name] [phone]    Blake Divine A Daurice Ovando 04/08/2017, 10:34 AM

## 2017-04-08 NOTE — Evaluation (Signed)
Clinical/Bedside Swallow Evaluation Patient Details  Name: Curtis Keller MRN: [id number] Date of Birth: 07/21/1956  Today's Date: 04/08/2017 Time: SLP Start Time (ACUTE ONLY): 1125 SLP Stop Time (ACUTE ONLY): 1130 SLP Time Calculation (min) (ACUTE ONLY): 5 min  Past Medical History:  Past Medical History:  Diagnosis Date  . Acute respiratory failure (HCC)    a. Spring 2017 following fall/splenectomy -->admitted to [facility]-->trach/g tube.  . Anemia    a. 12/2015 ABL in setting of fall/hematomas/splenic laceration req splenectomy.  Marland Kitchen Anxiety   . Apical mural thrombus    a. 11/2015 Echo: EF 25-30%, moderate size apical thrombus-->coumadin;  b. 12/2015 f/u Echo: EF 25-30%, ant/septa HK, no obvious large thrombus but cannot exclude small mural thrombus.  Marland Kitchen CAD (coronary artery disease)    a. 12/2014 s/p BMS to the RCA.  Marland Kitchen Cerebral infarction (HCC)   . Chronic bronchitis (HCC)    "probably"/sister 04/07/2017  . Chronic systolic CHF (congestive heart failure) (HCC)    a. 12/2015 Echo: EF 25-30%.  . Dementia   . Depression   . Dysphagia   . Falls    a. 11/2015 traumatic fall with resultant trauma req splenectomy and prolonged hospitalization complicated by resp failure.  . Hepatitis C    "never treated" (04/07/2017)  . History of blood transfusion 12/2015; 04/07/2017   "related to spleen OR; related to low HgB"   . History of gout   . Hyperlipidemia   . Hypertension   . Ischemic cardiomyopathy    a. 12/2015 Echo: EF 25-30%, anterior and septal HK.  Marland Kitchen Left hemiplegia (HCC)   . Myocardial infarction (HCC) 1999; ?2010  . On home oxygen therapy    "28% at the SNF" (6/15/2018Z)  . Pneumonia    "several times since 12/19/2015" (6/15/20180)  . Protein calorie malnutrition (HCC)   . Respiratory failure (HCC)   . Spleen injury   . Status post tracheostomy (HCC)    a. 01/2016 in setting of ongoing resp failure and aspiration.  . Stroke (HCC) 12/19/2015   "left side paralyzed  since" (04/07/2017)  . Type II diabetes mellitus (HCC)    Past Surgical History:  Past Surgical History:  Procedure Laterality Date  . ABDOMINAL HERNIA REPAIR    . CARDIAC CATHETERIZATION    . CORONARY ANGIOPLASTY WITH STENT PLACEMENT  1999   "[facility]"  . HERNIA REPAIR    . HIP ARTHROPLASTY Left 02/10/2017   Procedure: LEFT HIP HEMIARTHROPLASTY;  Surgeon: [name], MD;  Location: [facility] OR;  Service: Orthopedics;  Laterality: Left;  . IR GASTROSTOMY TUBE MOD SED  02/02/2017  . IR GENERIC HISTORICAL  07/22/2016   IR GASTROSTOMY TUBE REMOVAL 07/22/2016 [name], MD [facility]-INTERV RAD  . PR LAP, SPLENECTOMY  01/07/2016  . TRACHEOSTOMY TUBE PLACEMENT N/A 02/22/2016   Procedure: TRACHEOSTOMY;  Surgeon: [name], MD;  Location: [facility] OR;  Service: ENT;  Laterality: N/A;  . TRACHEOSTOMY TUBE PLACEMENT N/A 01/14/2017   Procedure: TRACHEOSTOMY;  Surgeon: [name], MD;  Location[facility] OR;  Service: ENT;  Laterality: N/A;  inserted #[id number] CVE#[id number]   HPI:  Pt is a 61 year old male with a history of respiratory failure, chronic illness, and dysphagia. He has transitioned repeatedly through acute care, LTACh and SNF settings over the past year. He has a PEG tube but when he is in his best condition ahs toelrated thickened liquids and soft solids as well as thin liquids in between meals. He is often impulsive and  has had aspiration events with solid foods due to lack of dentition and poor safety awarenes. Pt has multiple prior MBS with the last on [facility] though those records are not available. Pt was recently admitted and seen by SLP 03/30/17, who noted pt had been strictly NPO for at least a month, recommended pt remain NPO with f/u with SLP at SNF, gradual return to PO trials and eventual objective testing. Pt readmitted 04/07/17 with recurrent aspiration pneuomia; given pt's continued NPO status it is unlikely current diagnosis of aspiration pna related to PO intake. CXR 04/08/17 findings of left  lower lobe airspace disease unchanged, progression of mild right upper lobe airspace disease. Possible pneumonia.   Assessment / Plan / Recommendation Clinical Impression  Patient presents with severe risk for aspiration in the setting of chronic dysphagia, recurrent aspiration pneumonia, prolonged NPO status with high oral bacterial load and poor oral care. Patient presents today with improved alertness compared with ST evaluation during admission 03/30/17, though PO trials held this date given pt's poor oral care, multiple risk factors for aspiration. Recommend instrumental assessment (MBS) to evaluate improvements in pt's swallow function and to aid in determining pt's prognosis for returning to oral feeding. SLP will f/u next date for MBS, pt arousal permitting. SLP Visit Diagnosis: Dysphagia, oropharyngeal phase (R13.12)    Aspiration Risk  Severe aspiration risk;Risk for inadequate nutrition/hydration    Diet Recommendation NPO;Alternative means - long-term        Other  Recommendations Oral Care Recommendations: Oral care QID   Follow up Recommendations Skilled Nursing facility      Frequency and Duration            Prognosis Prognosis for Safe Diet Advancement: Guarded Barriers to Reach Goals: Cognitive deficits;Time post onset;Severity of deficits      Swallow Study   General Date of Onset: 04/07/17 HPI: Pt is a 61 year old male with a history of respiratory failure, chronic illness, and dysphagia. He has transitioned repeatedly through acute care, [facility]TA[facility] and SNF settings over the past year. He has a PEG tube but when he is in his best condition ahs toelrated thickened liquids and soft solids as well as thin liquids in between meals. He is often impulsive and has had aspiration events with solid foods due to lack of dentition and poor safety awarenes. Pt has multiple prior MBS with the last on [facility] though those records are not available. Pt was recently admitted and seen by SLP  03/30/17, who noted pt had been strictly NPO for at least a month, recommended pt remain NPO with f/u with SLP at SNF, gradual return to PO trials and eventual objective testing. Pt readmitted 04/07/17 with recurrent aspiration pneuomia; given pt's continued NPO status it is unlikely current diagnosis of aspiration pna related to PO intake. CXR 04/08/17 findings of left lower lobe airspace disease unchanged, progression of mild right upper lobe airspace disease. Possible pneumonia. Type of Study: Bedside Swallow Evaluation Previous Swallow Assessment: see HPI Diet Prior to this Study: NPO Temperature Spikes Noted: No Respiratory Status: Trach Collar History of Recent Intubation: No Behavior/Cognition: Alert;Cooperative Oral Cavity Assessment: Dried secretions;Dry;Excessive secretions Oral Care Completed by SLP: No Oral Cavity - Dentition: Edentulous Baseline Vocal Quality: Low vocal intensity Volitional Cough: Cognitively unable to elicit Volitional Swallow: Able to elicit    Oral/Motor/Sensory Function Overall Oral Motor/Sensory Function: Generalized oral weakness Facial ROM: Reduced right;Reduced left Facial Symmetry: Within Functional Limits Facial Sensation: Within Functional Limits Lingual ROM: Within Functional Limits  Lingual Symmetry: Within Functional Limits Lingual Strength: Reduced Velum: Within Functional Limits Mandible: Impaired   Ice Chips Ice chips: Not tested   Thin Liquid Thin Liquid: Not tested    Nectar Thick Nectar Thick Liquid: Not tested   Honey Thick Honey Thick Liquid: Not tested   Puree Puree: Not tested   Solid   GO   Solid: Not tested        Arlana Lindau 04/08/2017,11:44 AM  Rondel Baton, MS, CCC-SLP Speech-Language Pathologist [phone]

## 2017-04-09 DIAGNOSIS — J9503 Malfunction of tracheostomy stoma: Secondary | ICD-10-CM | POA: Diagnosis not present

## 2017-04-09 DIAGNOSIS — D649 Anemia, unspecified: Secondary | ICD-10-CM | POA: Diagnosis not present

## 2017-04-09 DIAGNOSIS — D62 Acute posthemorrhagic anemia: Secondary | ICD-10-CM | POA: Diagnosis not present

## 2017-04-09 DIAGNOSIS — J9621 Acute and chronic respiratory failure with hypoxia: Secondary | ICD-10-CM | POA: Diagnosis not present

## 2017-04-09 DIAGNOSIS — I513 Intracardiac thrombosis, not elsewhere classified: Secondary | ICD-10-CM | POA: Diagnosis not present

## 2017-04-09 LAB — COMPREHENSIVE METABOLIC PANEL
ALK PHOS: 129 U/L — AB (ref 38–126)
ALT: 21 U/L (ref 17–63)
AST: 27 U/L (ref 15–41)
Albumin: 1.8 g/dL — ABNORMAL LOW (ref 3.5–5.0)
Anion gap: 7 (ref 5–15)
BILIRUBIN TOTAL: 0.6 mg/dL (ref 0.3–1.2)
BUN: 48 mg/dL — ABNORMAL HIGH (ref 6–20)
CALCIUM: 9.2 mg/dL (ref 8.9–10.3)
CO2: 25 mmol/L (ref 22–32)
CREATININE: 2.08 mg/dL — AB (ref 0.61–1.24)
Chloride: 110 mmol/L (ref 101–111)
GFR, EST AFRICAN AMERICAN: 38 mL/min — AB (ref >60)
GFR, EST NON AFRICAN AMERICAN: 32 mL/min — AB (ref >60)
Glucose, Bld: 135 mg/dL — ABNORMAL HIGH (ref 65–99)
Potassium: 3.7 mmol/L (ref 3.5–5.1)
Sodium: 142 mmol/L (ref 135–145)
Total Protein: 7.1 g/dL (ref 6.5–8.1)

## 2017-04-09 LAB — GLUCOSE, CAPILLARY
GLUCOSE-CAPILLARY: 136 mg/dL — AB (ref 65–99)
GLUCOSE-CAPILLARY: 171 mg/dL — AB (ref 65–99)
GLUCOSE-CAPILLARY: 179 mg/dL — AB (ref 65–99)
Glucose-Capillary: 146 mg/dL — ABNORMAL HIGH (ref 65–99)
Glucose-Capillary: 119 mg/dL — ABNORMAL HIGH (ref 65–99)
Glucose-Capillary: 172 mg/dL — ABNORMAL HIGH (ref 65–99)
Glucose-Capillary: 160 mg/dL — ABNORMAL HIGH (ref 65–99)

## 2017-04-09 LAB — CBC
HEMATOCRIT: 37.7 % — AB (ref 39.0–52.0)
HEMOGLOBIN: 11.8 g/dL — AB (ref 13.0–17.0)
MCH: 30.6 pg (ref 26.0–34.0)
MCHC: 31.3 g/dL (ref 30.0–36.0)
MCV: 97.9 fL (ref 78.0–100.0)
Platelets: 205 10*3/uL (ref 150–400)
RBC: 3.85 MIL/uL — AB (ref 4.22–5.81)
RDW: 16.6 % — ABNORMAL HIGH (ref 11.5–15.5)
WBC: 11.3 10*3/uL — ABNORMAL HIGH (ref 4.0–10.5)

## 2017-04-09 LAB — PROTIME-INR
INR: 2.72
Prothrombin Time: 29.4 seconds — ABNORMAL HIGH (ref 11.4–15.2)

## 2017-04-09 MED ORDER — SCOPOLAMINE 1 MG/3DAYS TD PT72
1.0000 | MEDICATED_PATCH | TRANSDERMAL | Status: DC
  Administered 2017-04-09: 1.5 mg via TRANSDERMAL
  Filled 2017-04-09: qty 1

## 2017-04-09 NOTE — Progress Notes (Signed)
SLP Cancellation Note  Patient Details Name: Curtis Keller MRN: [id number] DOB: 1955/11/12   Cancelled treatment:       Reason Eval/Treat Not Completed: Fatigue/lethargy limiting ability to participate. Will f/u on next date for readiness to complete MBS.   Maxcine Ham 04/09/2017, 12:03 PM  Maxcine Ham, M.A. CCC-SLP [phone]

## 2017-04-09 NOTE — Progress Notes (Signed)
@IPLOG         PROGRESS NOTE                                                                                                                                                                                                             Patient Demographics:    Juel Bellerose, is a 61 y.o. male, DOB - [birth year], ZOX:[id number]  Admit date - 04/07/2017   Admitting Physician Clydie Braun, MD  Outpatient Primary MD for the patient is [facility]  LOS - 2  Chief Complaint  Patient presents with  . Fever  . Shortness of Breath       Brief Narrative   HUSSIEN GREENBLATT is a 61 y.o. male with extensive medical history listed below, including tracheostomy dependent, on oxygen via trach, dysphagia,  status post PEG tube feeding, COPD, CVA in February 2017 with residual L hemiparesis/contracturs,  chronic systolic CHF, ICM with EF  25-30 %, CKD 3, chronic anemia, dementia, recurrent falls, recent heat fracture surgery, a history of LV apical thrombosis initially on  Eliquis then switched towarfarin due to development of CVA , history od AAA without rupture as of last discharge, Hep C and recent admission for aspiration pneumonia with hypoxia discharged on 03/30/2017 to SNF, was readmitted on 04/07/2017 for fever and shortness of breath due to pneumonia.   Subjective:   Patient in bed, appears comfortable, denies any headache, no fever, no chest pain or pressure, no shortness of breath , no abdominal pain. No focal weakness.     Assessment  & Plan :     1. Recurrent pneumonia with acute on chronic hypoxic respiratory failure , no sepsis today. Likely due to recurrent aspiration with increased respiratory secretions, he has underlying dysphagia from previous stroke and currently on PEG tube feedings, cultures so far are negative, continue empiric IV antibiotics, continue trach collar oxygen supplementation along with nebulizer treatments. Critical care has evaluated his trach, due  to increased secretions critical care has added Mucomyst treatments as well, will try scopolamine patch in hopes of reducing tracheal secretions and chances of aspiration. Will monitor mental status.  2. Chronic systolic heart failure EF 25%, history of LV apical thrombus. Continue beta blocker via PEG tube, no ACE/R/Entresto due to renal insufficiency, continue Coumadin. Currently appears compensated from CHF standpoint.  3. CVA and stroke. Dense left-sided hemiparesis and dysphagia, continue Coumadin and statin for secondary prevention, will require discharge to SNF.   4. Dyslipidemia. On statin.  5. Dysphagia. For now nothing  by mouth and PEG tube feeds, will request patient to evaluate one time.  6. Essential hypertension. Continue beta blocker.  7. Anemia of chronic disease. Stable continue to monitor, there was question that his hemoglobin was 4.6 upon admission I think that was a lab.  8. Mild acute renal failure on chronic kidney disease stage IV. Baseline creatinine around 2.4. With gentle hydration back to baseline.  9. Dementia and delirium. Continue Zyprexa.  10. History of nonsustained ventricular tachycardia. EF less than 50%. For now on beta blocker continue.  11. COPD. No wheezing. Supportive care.    Diet : Diet NPO time specified    Family Communication  : None  Code Status :  Ful  Disposition Plan  :  Step Down  Consults  : Requested PCCM to evaluate his trach.  Procedures  :     CT abdomen and pelvis. Nonacute.  DVT Prophylaxis  : Coumadin  Lab Results  Component Value Date   INR 2.72 04/09/2017   INR 2.95 04/08/2017   INR 2.72 04/07/2017   PROTIME 30.2 (A) 03/31/2017   PROTIME 25.4 (A) 07/28/2016   PROTIME 28.5 (A) 07/13/2016     Lab Results  Component Value Date   PLT 205 04/09/2017    Inpatient Medications  Scheduled Meds: . acetylcysteine  4 mL Nebulization BID  . atorvastatin  40 mg Per Tube QHS  . chlorhexidine  15 mL Mouth Rinse  BID  . famotidine  20 mg Per Tube Q24H  . feeding supplement (PRO-STAT SUGAR FREE 64)  60 mL Per Tube BID  . folic acid  1 mg Per Tube Daily  . free water  150 mL Per Tube Q4H  . guaiFENesin  200 mg Per Tube TID  . insulin aspart  0-9 Units Subcutaneous Q4H  . ipratropium-albuterol  3 mL Nebulization Q6H  . mouth rinse  15 mL Mouth Rinse q12n4p  . metoprolol tartrate  25 mg Per Tube BID  . multivitamin with minerals  1 tablet Per Tube Daily  . OLANZapine  5 mg Per Tube BID  . cyanocobalamin  1,000 mcg Per Tube Daily  . Warfarin - Physician Dosing Inpatient   Does not apply q1800   Continuous Infusions: . ceFEPime (MAXIPIME) IV Stopped (04/09/17 0400)  . feeding supplement (OSMOLITE 1.5 CAL) 1,000 mL (04/07/17 2117)  . vancomycin Stopped (04/09/17 0321)   PRN Meds:.albuterol, bisacodyl, ondansetron **OR** ondansetron (ZOFRAN) IV, polyethylene glycol, senna-docusate  Antibiotics  :    Anti-infectives    Start     Dose/Rate Route Frequency Ordered Stop   04/08/17 2100  vancomycin (VANCOCIN) 1,250 mg in sodium chloride 0.9 % 250 mL IVPB     1,250 mg 166.7 mL/hr over 90 Minutes Intravenous Every 24 hours 04/07/17 1951     04/08/17 1500  vancomycin (VANCOCIN) 1,250 mg in sodium chloride 0.9 % 250 mL IVPB  Status:  Discontinued     1,250 mg 166.7 mL/hr over 90 Minutes Intravenous Every 24 hours 04/07/17 1542 04/07/17 1951   04/08/17 1400  vancomycin (VANCOCIN) 1,250 mg in sodium chloride 0.9 % 250 mL IVPB  Status:  Discontinued     1,250 mg 166.7 mL/hr over 90 Minutes Intravenous Every 24 hours 04/07/17 1446 04/07/17 1542   04/07/17 2300  ceFEPIme (MAXIPIME) 1 g in dextrose 5 % 50 mL IVPB     1 g 100 mL/hr over 30 Minutes Intravenous Every 24 hours 04/07/17 1536     04/07/17 1500  vancomycin (VANCOCIN) 1,500  mg in sodium chloride 0.9 % 500 mL IVPB     1,500 mg 250 mL/hr over 120 Minutes Intravenous  Once 04/07/17 1446 04/07/17 2257   04/07/17 1445  piperacillin-tazobactam (ZOSYN)  IVPB 2.25 g  Status:  Discontinued     2.25 g 100 mL/hr over 30 Minutes Intravenous  Once 04/07/17 1433 04/07/17 1438   04/07/17 1445  piperacillin-tazobactam (ZOSYN) IVPB 3.375 g     3.375 g 100 mL/hr over 30 Minutes Intravenous  Once 04/07/17 1439 04/07/17 1537         Objective:   Vitals:   04/09/17 0500 04/09/17 0510 04/09/17 0726 04/09/17 0728  BP: 127/83     Pulse: 76     Resp: (!) 22     Temp:  98.4 F (36.9 C)    TempSrc:  Oral    SpO2: 96%  97% 97%  Weight:      Height:        Wt Readings from Last 3 Encounters:  04/09/17 96.2 kg (212 lb 1.3 oz)  04/04/17 92.5 kg (204 lb)  04/03/17 92.5 kg (204 lb)     Intake/Output Summary (Last 24 hours) at 04/09/17 0903 Last data filed at 04/09/17 0500  Gross per 24 hour  Intake             1715 ml  Output              875 ml  Net              840 ml     Physical Exam  Awake Alert, Oriented X 3, Dense left-sided hemiparesis, PEG tube in place, wearing trach collar [place].AT,PERRAL Supple Neck,No JVD, No cervical lymphadenopathy appriciated.  Symmetrical Chest wall movement, Good air movement bilaterally, coarse bibasilar L>R breath sounds RRR,No Gallops,Rubs or new Murmurs, No Parasternal Heave +ve B.Sounds, Abd Soft, No tenderness, No organomegaly appriciated, No rebound - guarding or rigidity. No Cyanosis, Clubbing or edema, No new Rash or bruise      Data Review:    CBC  Recent Labs Lab 04/07/17 1243 04/07/17 1426 04/08/17 0429 04/09/17 0545  WBC 25.3*  --  11.3* 11.3*  HGB 4.6* 11.6* 12.7* 11.8*  HCT 14.7* 34.0* 39.9 37.7*  PLT 389  --  220 205  MCV 101.4*  --  96.8 97.9  MCH 31.7  --  30.8 30.6  MCHC 31.3  --  31.8 31.3  RDW 15.0  --  17.2* 16.6*  LYMPHSABS 7.6*  --   --   --   MONOABS 1.3*  --   --   --   EOSABS 2.5*  --   --   --   BASOSABS 0.0  --   --   --     Chemistries   Recent Labs Lab 04/07/17 1243 04/07/17 1426 04/08/17 0429 04/09/17 0545  NA 143 146* 142 142  K 4.0 3.7 4.0  3.7  CL 107 109 108 110  CO2 27  --  26 25  GLUCOSE 102* 98 146* 135*  BUN 52* 48* 50* 48*  CREATININE 2.59* 2.60* 2.43* 2.08*  CALCIUM 9.7  --  9.3 9.2  AST 38  --  34 27  ALT 28  --  26 21  ALKPHOS 109  --  105 129*  BILITOT 0.6  --  0.8 0.6   ------------------------------------------------------------------------------------------------------------------ No results for input(s): CHOL, HDL, LDLCALC, TRIG, CHOLHDL, LDLDIRECT in the last 72 hours.  Lab Results  Component Value Date  HGBA1C 5.3 03/22/2017   ------------------------------------------------------------------------------------------------------------------ No results for input(s): TSH, T4TOTAL, T3FREE, THYROIDAB in the last 72 hours.  Invalid input(s): FREET3 ------------------------------------------------------------------------------------------------------------------ No results for input(s): VITAMINB12, FOLATE, FERRITIN, TIBC, IRON, RETICCTPCT in the last 72 hours.  Coagulation profile  Recent Labs Lab 04/07/17 1243 04/08/17 0429 04/09/17 0545  INR 2.72 2.95 2.72    No results for input(s): DDIMER in the last 72 hours.  Cardiac Enzymes No results for input(s): CKMB, TROPONINI, MYOGLOBIN in the last 168 hours.  Invalid input(s): CK ------------------------------------------------------------------------------------------------------------------    Component Value Date/Time   BNP 199.5 (H) 03/30/2017 1610    Micro Results Recent Results (from the past 240 hour(s))  Blood Culture (routine x 2)     Status: None (Preliminary result)   Collection Time: 04/07/17 12:50 PM  Result Value Ref Range Status   Specimen Description BLOOD RIGHT FOREARM  Final   Special Requests   Final    BOTTLES DRAWN AEROBIC AND ANAEROBIC Blood Culture adequate volume   Culture NO GROWTH < 24 HOURS  Final   Report Status PENDING  Incomplete  Urine culture     Status: None   Collection Time: 04/07/17  3:12 PM  Result  Value Ref Range Status   Specimen Description URINE, CATHETERIZED  Final   Special Requests NONE  Final   Culture NO GROWTH  Final   Report Status 04/08/2017 FINAL  Final  Blood Culture (routine x 2)     Status: None (Preliminary result)   Collection Time: 04/07/17  3:44 PM  Result Value Ref Range Status   Specimen Description BLOOD RIGHT FOREARM  Final   Special Requests IN PEDIATRIC BOTTLE Blood Culture adequate volume  Final   Culture NO GROWTH < 24 HOURS  Final   Report Status PENDING  Incomplete    Radiology Reports Ct Abdomen Pelvis Wo Contrast  Result Date: 04/07/2017 CLINICAL DATA:  Anemia history of aortic aneurysm EXAM: CT ABDOMEN AND PELVIS WITHOUT CONTRAST TECHNIQUE: Multidetector CT imaging of the abdomen and pelvis was performed following the standard protocol without IV contrast. COMPARISON:  01/30/2017 FINDINGS: Lower chest: Patchy bibasilar opacities. No pleural effusion. Cardiomegaly with coronary artery calcification. Calcifications at the ventricular apex may relate to old infarct. Hepatobiliary: No focal hepatic abnormality. Possible small stones or sludge in the gallbladder. No biliary dilatation Pancreas: Unremarkable. No pancreatic ductal dilatation or surrounding inflammatory changes. Spleen: Small splenic tissue in the left upper quadrant with adjacent surgical change or calcification. Adrenals/Urinary Tract: Adrenal glands are within normal limits. Punctate nonobstructing stone in the mid right kidney. No hydronephrosis. The bladder is unremarkable Stomach/Bowel: Stomach is nonenlarged. There is a gastrostomy tube. No dilated small bowel. No colon wall thickening. Vascular/Lymphatic: Atherosclerotic vascular disease. No aneurysmal dilatation. No significantly enlarged lymph nodes. Reproductive: Prostate glands calcifications. Other: Ventral hernia containing mesenteric fat and small amount of bowel. No free air or free fluid Musculoskeletal: Status post left hip  replacement with associated artifact. Probable old fracture left inferior pubic ramus. Old right-sided rib fractures. IMPRESSION: 1. Aortic atherosclerotic vascular disease but no aneurysmal dilatation. No evidence for retroperitoneal mass or hematoma. 2. Patchy bibasilar lung opacities, not significantly changed 3. Punctate nonobstructing stone in the mid right kidney 4. Possible small stones or sludge in the gallbladder 5. Ventral hernia containing mesenteric fat and small amount of bowel but no evidence for an obstruction Electronically Signed   By: Jasmine Pang M.D.   On: 04/07/2017 22:43   Dg Chest 2 View  Result Date: 03/17/2017  CLINICAL DATA:  61 year old with chronic ventilator dependent respiratory failure presenting with cough. Current history of chronic systolic congestive heart failure, hypertension, cardiomyopathy and COPD. EXAM: CHEST  2 VIEW 3:48 p.m.: COMPARISON:  Portable chest x-ray earlier today 1:43 p.m., 03/06/2017 and earlier. FINDINGS: AP erect and lateral images were obtained. Tracheostomy tube tip in satisfactory position below the thoracic inlet projecting approximately 5 cm above carina. Patchy airspace opacities in the lung bases, left greater than right, similar to the examination earlier today, new since 03/06/2017. Slight improvement in the interstitial pulmonary edema since earlier today. No new pulmonary parenchymal abnormalities since earlier today. IMPRESSION: 1. Tracheostomy tube tip in satisfactory position approximately 5 cm above carina. 2. Pneumonia involving the lung bases, left greater than right. 3. Improving interstitial pulmonary edema. Electronically Signed   By: Hulan Saas M.D.   On: 03/17/2017 16:14   Ct Chest Wo Contrast  Result Date: 03/29/2017 CLINICAL DATA:  Shortness of breath, history respiratory failure requiring increased oxygen usage today, tracheostomy dependent, denies chest pain EXAM: CT CHEST WITHOUT CONTRAST TECHNIQUE: Multidetector CT  imaging of the chest was performed following the standard protocol without IV contrast. Sagittal and coronal MPR images reconstructed from axial data set. COMPARISON:  12/23/2016 FINDINGS: Cardiovascular: Atherosclerotic calcifications aorta, proximal great vessels, and coronary arteries ascending aorta aneurysmally dilated 4.5 cm transverse image 62. No pericardial effusion. Calcifications at cardiac apex question prior MI. Mediastinum/Nodes: Esophagus unremarkable. Tracheostomy tube within trachea. Scattered normal sized mediastinal lymph nodes largest 10 mm RIGHT paratracheal node image 47 and 10 mm para-aortic node image 53. Base of cervical region unremarkable. Lungs/Pleura: Scattered peripheral chronic interstitial infiltrates in both lungs. Dependent atelectasis in both lower lobes improved since previous exam. Central peribronchial thickening greatest in lower lobes. Scattered subpleural blebs at the apices greater on RIGHT. RIGHT upper lobe nodular density 9 mm diameter image 42 unchanged. No pleural effusion or pneumothorax. Upper Abdomen: Gastrostomy tube within collapsed stomach. Post splenectomy with question splenule at splenectomy bed. Visualized upper abdomen otherwise unremarkable. Musculoskeletal: Old appearing compression deformity of an upper thoracic vertebra approximately T6. Bones demineralized. No other focal bony abnormalities. IMPRESSION: Aortic atherosclerosis and coronary arterial calcifications aneurysmal dilatation of the ascending thoracic aorta 4.5 cm transverse, recommendation below. Ascending thoracic aortic aneurysm. Recommend semi-annual imaging followup by CTA or MRA and referral to cardiothoracic surgery if not already obtained. This recommendation follows 2010 ACCF/AHA/AATS/ACR/ASA/SCA/SCAI/SIR/STS/SVM Guidelines for the Diagnosis and Management of Patients With Thoracic Aortic Disease. Circulation. 2010; 121: Z610-R604 Mural calcification at cardiac apex question prior MI.  Bronchitic changes with scattered chronic peripheral interstitial infiltrates in both lungs and improved bibasilar atelectasis. No definite acute infiltrate. Stable 9 mm RIGHT upper lobe nodule. Upper normal sized mediastinal lymph nodes without definite thoracic adenopathy. Electronically Signed   By: Ulyses Southward M.D.   On: 03/29/2017 12:45   Dg Chest Port 1 View  Result Date: 04/08/2017 CLINICAL DATA:  Tracheostomy tube malfunctioning. Initial encounter. EXAM: PORTABLE CHEST 1 VIEW COMPARISON:  Chest radiograph performed earlier today at 6:44 a.m. FINDINGS: The patient's tracheostomy tube is seen ending 7-8 cm above the carina. It is grossly stable in appearance. Patchy bibasilar airspace opacities raise concern for multifocal pneumonia, redistributed from the prior study, though interstitial edema might have a similar appearance. Underlying vascular congestion is noted. No pleural effusion or pneumothorax is seen, though the left costophrenic angle is incompletely imaged on this study. The cardiomediastinal silhouette is borderline enlarged. No acute osseous abnormalities are seen. There is chronic deformity of  the right mid clavicle. IMPRESSION: 1. Tracheostomy tube seen ending 7-8 cm above the carina. It is grossly stable in appearance. 2. Patchy bibasilar airspace opacities raise concern for multifocal pneumonia, redistributed from the recent prior study, though interstitial edema might have a similar appearance. 3. Underlying vascular congestion and borderline cardiomegaly. Electronically Signed   By: [name] M.D.   On: 04/08/2017 21:37   Dg Chest Port 1 View  Result Date: 04/08/2017 CLINICAL DATA:  Sepsis EXAM: PORTABLE CHEST 1 VIEW COMPARISON:  04/07/2017 FINDINGS: Left lower lobe airspace disease unchanged. Mild right upper lobe airspace disease has progressed. Negative for effusion. Tracheostomy remains in good position. IMPRESSION: Left lower lobe airspace disease unchanged. Progression of  mild right upper lobe airspace disease. Possible pneumonia. Electronically Signed   By: Marlan Palau M.D.   On: 04/08/2017 09:34   Dg Chest Port 1 View  Result Date: 04/07/2017 CLINICAL DATA:  Short of breath fever EXAM: PORTABLE CHEST 1 VIEW COMPARISON:  03/27/2017 FINDINGS: Tracheostomy remains in good position. Bibasilar airspace disease is mildly improved. Negative for heart failure or effusion. IMPRESSION: Partial clearing of bibasilar atelectasis/infiltrate. Electronically Signed   By: Marlan Palau M.D.   On: 04/07/2017 13:01   Dg Chest Port 1 View  Result Date: 03/27/2017 CLINICAL DATA:  Shortness of Breath EXAM: PORTABLE CHEST 1 VIEW COMPARISON:  March 24, 2017 FINDINGS: Tracheostomy catheter tip is 4.5 cm above the carina. No pneumothorax. There is patchy airspace consolidation in the left base. There is mild bibasilar atelectasis as well. Heart is borderline enlarged with pulmonary vascularity within normal limits. No adenopathy. No bone lesions. IMPRESSION: Tracheostomy as described without pneumothorax. Patchy airspace opacity left base, suspect a degree of pneumonia. Patchy atelectasis in the bases as well. Stable cardiac silhouette. Electronically Signed   By: Bretta Bang III M.D.   On: 03/27/2017 09:32   Dg Chest Port 1 View  Result Date: 03/24/2017 CLINICAL DATA:  Hypoxia EXAM: PORTABLE CHEST 1 VIEW COMPARISON:  Two days ago FINDINGS: Tracheostomy tube in place. Cardiomegaly and diffuse interstitial opacity. Low lung volumes. Small layering pleural effusions are not excluded. No evidence of pneumothorax. IMPRESSION: 1. Cardiomegaly and mild pulmonary edema. 2. Low volume chest with atelectasis. 3. No change from study 2 days prior. Electronically Signed   By: Marnee Spring M.D.   On: 03/24/2017 14:47   Dg Chest Port 1 View  Result Date: 03/22/2017 CLINICAL DATA:  Hypoxia EXAM: PORTABLE CHEST 1 VIEW COMPARISON:  03/19/2017; 03/17/2017; 03/06/2017 FINDINGS: Grossly unchanged  enlarged cardiac silhouette and mediastinal contours given AP projection. Stable position of support apparatus. Pulmonary vasculature remains indistinct with cephalization of flow. Minimally improved aeration of lungs with persistent bibasilar opacities, left greater than right. No new focal airspace opacities. No definite pleural effusion or pneumothorax. No acute osseus abnormalities. IMPRESSION: 1.  Stable positioning of support apparatus.  No pneumothorax. 2. Minimally improved bibasilar atelectasis with otherwise similar findings of mild pulmonary edema. Electronically Signed   By: [name] M.D.   On: 03/22/2017 09:15   Dg Chest Port 1 View  Result Date: 03/19/2017 CLINICAL DATA:  Shortness of breath EXAM: PORTABLE CHEST 1 VIEW COMPARISON:  03/17/2017; 03/06/2017; 01/27/2017 FINDINGS: Unchanged enlarged cardiac silhouette and mediastinal contours. Stable position of support apparatus. Pulmonary vasculature appears less distinct with cephalization of flow. Interval development of trace right and small left-sided effusion with associated worsening bibasilar opacities, left greater than right. No pneumothorax. No acute osseus abnormalities. Old right anterior rib fractures. IMPRESSION: 1.  No acute cardiopulmonary disease. 2. Suspected worsening pulmonary edema and bibasilar atelectasis, left greater than right. Electronically Signed   By: [name] M.D.   On: 03/19/2017 13:40   Dg Chest Port 1 View  Result Date: 03/17/2017 CLINICAL DATA:  Shortness of breath EXAM: PORTABLE CHEST 1 VIEW COMPARISON:  03/06/2017, 02/20/2017, 02/08/2017 FINDINGS: Tracheostomy tube tip about 2.4 cm superior to the carina and projects over the tracheal air column. Cardiomegaly with central vascular congestion and mild interstitial edema. Streaky opacity at the left base may reflect atelectasis or an infiltrate. Old right clavicular fracture and old rib fractures. IMPRESSION: Tracheostomy tube tip projects over tracheal air  column and tip is about 2.4 cm superior to carina Cardiomegaly with central vascular congestion and mild diffuse edema. Streaky atelectasis or pneumonia at the left base. Electronically Signed   By: Jasmine Pang M.D.   On: 03/17/2017 14:02    Time Spent in minutes  30   Susa Raring M.D on 04/09/2017 at 9:03 AM  Between 7am to 7pm - Pager - [phone] ( page via [email or web address], text pages only, please mention full 10 digit call back number). After 7pm go to [email or web address] - password [facility]

## 2017-04-10 ENCOUNTER — Inpatient Hospital Stay (HOSPITAL_COMMUNITY): Payer: Medicare Other

## 2017-04-10 DIAGNOSIS — R0602 Shortness of breath: Secondary | ICD-10-CM | POA: Diagnosis not present

## 2017-04-10 DIAGNOSIS — J9503 Malfunction of tracheostomy stoma: Secondary | ICD-10-CM | POA: Diagnosis not present

## 2017-04-10 DIAGNOSIS — D649 Anemia, unspecified: Secondary | ICD-10-CM | POA: Diagnosis not present

## 2017-04-10 DIAGNOSIS — J9621 Acute and chronic respiratory failure with hypoxia: Secondary | ICD-10-CM | POA: Diagnosis not present

## 2017-04-10 LAB — GLUCOSE, CAPILLARY
GLUCOSE-CAPILLARY: 140 mg/dL — AB (ref 65–99)
Glucose-Capillary: 148 mg/dL — ABNORMAL HIGH (ref 65–99)
Glucose-Capillary: 164 mg/dL — ABNORMAL HIGH (ref 65–99)
Glucose-Capillary: 158 mg/dL — ABNORMAL HIGH (ref 65–99)
Glucose-Capillary: 126 mg/dL — ABNORMAL HIGH (ref 65–99)
Glucose-Capillary: 117 mg/dL — ABNORMAL HIGH (ref 65–99)

## 2017-04-10 LAB — CBC
HCT: 38.6 % — ABNORMAL LOW (ref 39.0–52.0)
Hemoglobin: 12.1 g/dL — ABNORMAL LOW (ref 13.0–17.0)
MCH: 30.7 pg (ref 26.0–34.0)
MCHC: 31.3 g/dL (ref 30.0–36.0)
MCV: 98 fL (ref 78.0–100.0)
Platelets: 206 10*3/uL (ref 150–400)
RBC: 3.94 MIL/uL — AB (ref 4.22–5.81)
RDW: 15.7 % — ABNORMAL HIGH (ref 11.5–15.5)
WBC: 11.5 10*3/uL — AB (ref 4.0–10.5)

## 2017-04-10 LAB — PROTIME-INR
INR: 2.43
PROTHROMBIN TIME: 26.9 s — AB (ref 11.4–15.2)

## 2017-04-10 MED ORDER — IPRATROPIUM-ALBUTEROL 0.5-2.5 (3) MG/3ML IN SOLN
3.0000 mL | Freq: Four times a day (QID) | RESPIRATORY_TRACT | Status: DC
  Administered 2017-04-10 – 2017-04-13 (×14): 3 mL via RESPIRATORY_TRACT
  Filled 2017-04-10 (×15): qty 3

## 2017-04-10 MED ORDER — SODIUM CHLORIDE 0.9 % IV SOLN
INTRAVENOUS | Status: DC
  Administered 2017-04-10: 09:00:00 via INTRAVENOUS

## 2017-04-10 NOTE — Care Management Note (Addendum)
Case Management Note  Patient Details  Name: Curtis Keller MRN: [id number] Date of Birth: August 23, 1956  Subjective/Objective:    From [facility], trach dependent, bed bound, peg, copd, hx cva, presents with  Sepsis and poss recurrent pna,chronic sys HF,  CVA, Dyslipidemia, dysphagia, htn, anemia, arf, dementia.   6/19 1245 [name] RN, BSN - Patient stated that NCM can speak with his sister Curtis Keller.  NCM called Curtis Keller, she states she is his POA, she does not want him to go back to [facility] she wants to bring him home.  She stays in a double wide, she will need a hospital bed and a bsc.  She states she will have to quit her job and be home with him, but she will be there 24/7.  She asked about the 16 hr RN with Frances Furbish.  NCM made referral to [name] and she will be giving [name] a call to discuss the 16 hr RN care for trachs.  Patient will need order for [facility] and Hospital Bed.             Action/Plan: NCM will follow for dc needs.   Expected Discharge Date:                  Expected Discharge Plan:  Skilled Nursing Facility  In-House Referral:  Clinical Social Work  Discharge planning Services     Post Acute Care Choice:    Choice offered to:     DME Arranged:    DME Agency:     HH Arranged:    HH Agency:     Status of Service:  In process, will continue to follow  If discussed at Long Length of Stay Meetings, dates discussed:    Additional Comments:  [name], RN 04/10/2017, 4:21 PM

## 2017-04-10 NOTE — Progress Notes (Signed)
Attempted to suction pt trach.  Pt attempting to hit RT and RN. Unable to suction

## 2017-04-10 NOTE — Progress Notes (Signed)
Pharmacy Antibiotic Note  Curtis Keller is a 61 y.o. male admitted on 04/07/2017 with sepsis.  Pharmacy has been consulted for vancomycin and cefepime dosing.   Day #4 of abx for sepsis 2/2 to likely asp PNA (secretions) Afebrile, WBC stable at 11.5.  Plan: Continue cefepime 1g IV Q24h Continue vancomycin 1,250mg  IV Q24h Monitor clinical picture, renal function, VT prn  F/U abx deescalation / LOT  Consider abx de-escalation today?  Temp (24hrs), Avg:98.5 F (36.9 C), Min:98.2 F (36.8 C), Max:99.6 F (37.6 C)   Recent Labs Lab 04/07/17 1153 04/07/17 1243 04/07/17 1426 04/07/17 1443 04/07/17 1833 04/08/17 0429 04/09/17 0545 04/10/17 0311  WBC  --  25.3*  --   --   --  11.3* 11.3* 11.5*  CREATININE  --  2.59* 2.60*  --   --  2.43* 2.08*  --   LATICACIDVEN 1.79  --  1.86 1.5 2.0*  --   --   --     Estimated Creatinine Clearance: 42.8 mL/min (A) (by C-G formula based on SCr of 2.08 mg/dL (H)).    Allergies  Allergen Reactions  . Codeine Anaphylaxis and Nausea And Vomiting  . Lisinopril Itching and Rash  . Hydrocodone Other (See Comments)    On MAR  . Hydrocodone-Acetaminophen Nausea And Vomiting    Cannot take any acetaminophen due to liver issues per sister  . Tegaderm Ag Mesh [Silver] Other (See Comments)    Allergy to "silver compounds" listed on MAR  . Tylenol [Acetaminophen] Nausea Only and Other (See Comments)    Cannot take any acetaminophen due to liver issues per sister    Antimicrobials this admission: 6/15 Vanc >>  6/15 Zosyn x1  6/15 cefepime >>    Dose adjustments this admission:  n/a   Microbiology results:   6/15 BCxr: ngtd 6/15 UCx: ngF 5/25 MRSA PCR: positive 5/27 (prev admission) Trach asp: moderate candida glabrata - not treated  Enzo Bi, PharmD, BCPS Clinical Pharmacist Pager [phone] 04/10/2017 10:35 AM

## 2017-04-10 NOTE — Telephone Encounter (Signed)
Called and left a message with Elease Hashimoto letting her know that we are still trying to find an appointment for the pt. JJ - please advise if we can use one of TP's held spots. Thanks.

## 2017-04-10 NOTE — Evaluation (Signed)
Passy-Muir Speaking Valve - Evaluation Patient Details  Name: Curtis Keller MRN: [id number] Date of Birth: December 25, 1955  Today's Date: 04/10/2017 Time: 1035-1100 SLP Time Calculation (min) (ACUTE ONLY): 25 min  Past Medical History:  Past Medical History:  Diagnosis Date  . Acute respiratory failure (HCC)    a. Spring 2017 following fall/splenectomy -->admitted to [facility]-->trach/g tube.  . Anemia    a. 12/2015 ABL in setting of fall/hematomas/splenic laceration req splenectomy.  Marland Kitchen Anxiety   . Apical mural thrombus    a. 11/2015 Echo: EF 25-30%, moderate size apical thrombus-->coumadin;  b. 12/2015 f/u Echo: EF 25-30%, ant/septa HK, no obvious large thrombus but cannot exclude small mural thrombus.  Marland Kitchen CAD (coronary artery disease)    a. 12/2014 s/p BMS to the RCA.  Marland Kitchen Cerebral infarction (HCC)   . Chronic bronchitis (HCC)    "probably"/sister 04/07/2017  . Chronic systolic CHF (congestive heart failure) (HCC)    a. 12/2015 Echo: EF 25-30%.  . Dementia   . Depression   . Dysphagia   . Falls    a. 11/2015 traumatic fall with resultant trauma req splenectomy and prolonged hospitalization complicated by resp failure.  . Hepatitis C    "never treated" (04/07/2017)  . History of blood transfusion 12/2015; 04/07/2017   "related to spleen OR; related to low HgB"   . History of gout   . Hyperlipidemia   . Hypertension   . Ischemic cardiomyopathy    a. 12/2015 Echo: EF 25-30%, anterior and septal HK.  Marland Kitchen Left hemiplegia (HCC)   . Myocardial infarction (HCC) 1999; ?2010  . On home oxygen therapy    "28% at the SNF" (6/15/2018Z)  . Pneumonia    "several times since 12/19/2015" (6/15/20180)  . Protein calorie malnutrition (HCC)   . Respiratory failure (HCC)   . Spleen injury   . Status post tracheostomy (HCC)    a. 01/2016 in setting of ongoing resp failure and aspiration.  . Stroke (HCC) 12/19/2015   "left side paralyzed since" (04/07/2017)  . Type II diabetes mellitus (HCC)     Past Surgical History:  Past Surgical History:  Procedure Laterality Date  . ABDOMINAL HERNIA REPAIR    . CARDIAC CATHETERIZATION    . CORONARY ANGIOPLASTY WITH STENT PLACEMENT  1999   "[facility]"  . HERNIA REPAIR    . HIP ARTHROPLASTY Left 02/10/2017   Procedure: LEFT HIP HEMIARTHROPLASTY;  Surgeon: [name], MD;  Location: [facility] OR;  Service: Orthopedics;  Laterality: Left;  . IR GASTROSTOMY TUBE MOD SED  02/02/2017  . IR GENERIC HISTORICAL  07/22/2016   IR GASTROSTOMY TUBE REMOVAL 07/22/2016 [name], MD [facility]-INTERV RAD  . PR LAP, SPLENECTOMY  01/07/2016  . TRACHEOSTOMY TUBE PLACEMENT N/A 02/22/2016   Procedure: TRACHEOSTOMY;  Surgeon: [name], MD;  Location: [facility] OR;  Service: ENT;  Laterality: N/A;  . TRACHEOSTOMY TUBE PLACEMENT N/A 01/14/2017   Procedure: TRACHEOSTOMY;  Surgeon: [name], MD;  Location: [facility] OR;  Service: ENT;  Laterality: N/A;  inserted #[id number] VFI#[id number]   HPI:  Pt is a 61 year old male with a history of respiratory failure, chronic illness, and dysphagia. He has transitioned repeatedly through acute care, [facility] and SNF settings over the past year. He has a PEG tube but when he is in his best condition ahs toelrated thickened liquids and soft solids as well as thin liquids in between meals. He is often impulsive and has had aspiration events with solid foods due to  lack of dentition and poor safety awarenes. Pt has multiple prior MBS with the last on [facility] though those records are not available. Pt was recently admitted and seen by SLP 03/30/17, who noted pt had been strictly NPO for at least a month, recommended pt remain NPO with f/u with SLP at SNF, gradual return to PO trials and eventual objective testing. Pt readmitted 04/07/17 with recurrent aspiration pneuomia; given pt's continued NPO status it is unlikely current diagnosis of aspiration pna related to PO intake. CXR 04/08/17 findings of left lower lobe airspace disease unchanged, progression of  mild right upper lobe airspace disease. Possible pneumonia.   Assessment / Plan / Recommendation Clinical Impression  Pt demonstrates excellent tolerance of PMSV over session of MBS. Though vital signs were not available, there were no signs of distress or air trapping with periodic removal. Breath support for speech adequate though force for expectoration of aspirate/penetrate poor. Pts mentation and arousal waxes and wanes, so pt is recommended to wear PMSV with full staff supervision for communication of wants and needs during care. Unsupervised use not recommended as pt will probably sleep. Will follow for tolerance.  SLP Visit Diagnosis: Aphonia (R49.1)    SLP Assessment  Patient needs continued Speech Lanaguage Pathology Services    Follow Up Recommendations  Skilled Nursing facility    Frequency and Duration min 2x/week  2 weeks    PMSV Trial PMSV was placed for: 30 mintues Able to redirect subglottic air through upper airway: Yes Able to Attain Phonation: Yes Voice Quality: Normal Able to Expectorate Secretions: Yes Level of Secretion Expectoration with PMSV: Oral Breath Support for Phonation: Adequate Intelligibility: Intelligible Respirations During Trial:  (not available, appeared WNL) SpO2 During Trial:  (No reading) Pulse During Trial:  (no reading) Behavior: Alert   Tracheostomy Tube  Additional Tracheostomy Tube Assessment Trach Collar Period: all waking hours Secretion Description: not observed    Vent Dependency  Vent Dependent: No FiO2 (%): 28 %    Cuff Deflation Trial  GO          Elantra Caprara, Riley Nearing 04/10/2017, 12:44 PM

## 2017-04-10 NOTE — Evaluation (Signed)
Occupational Therapy Evaluation and Discharge Patient Details Name: Curtis Keller MRN: [id number] DOB: 1956/09/25 Today's Date: 04/10/2017    History of Present Illness Patient is a 61 y/o male who presents from [facility] with SOB, fever. Found to have sepsis and possibly PNA. PMH includes respiratory failure and trach dependent rquiring 02, dysphagia with PEG, COPD, CVA, contractures LUE/LE, CHF, falls, hip fx, LV apical thrombus, recently admitted 5/25 with hypoxia and PNA.    Clinical Impression   Pt is baseline dependent in ADL with exception of some grooming tasks. He is primarily bed bound at [facility]. No acute OT needs.    Follow Up Recommendations  SNF    Equipment Recommendations       Recommendations for Other Services       Precautions / Restrictions Precautions Precautions: Fall Precaution Comments: L hip hemi 02/10/17 from injury 2/18 Other Brace/Splint: Per last PT note, pt has soft posey knee spling to help keep knee straighter, not present today.  Restrictions Weight Bearing Restrictions: No      Mobility Bed Mobility Overal bed mobility: Needs Assistance Bed Mobility: Sit to Supine;Supine to Sit     Supine to sit: Max assist;+2 for physical assistance Sit to supine: +2 for physical assistance;Mod assist      Transfers                      Balance                                           ADL either performed or assessed with clinical judgement   ADL Overall ADL's : At baseline                                             Vision Patient Visual Report: No change from baseline       Perception     Praxis      Pertinent Vitals/Pain Pain Assessment: Faces Faces Pain Scale: Hurts a little bit Pain Location: back with positioning for xray Pain Descriptors / Indicators: Grimacing Pain Intervention(s): Monitored during session;Repositioned     Hand Dominance Right   Extremity/Trunk  Assessment Upper Extremity Assessment Upper Extremity Assessment: RUE deficits/detail;LUE deficits/detail RUE Deficits / Details: 4+/5 strength LUE Deficits / Details: no functional use, increased flexor tone and contracted fingers with limited shoulder ROM LUE Coordination: decreased fine motor;decreased gross motor   Lower Extremity Assessment Lower Extremity Assessment: Defer to PT evaluation       Communication Communication Communication: Passy-Muir valve;Tracheostomy   Cognition Arousal/Alertness: Awake/alert Behavior During Therapy: Flat affect Overall Cognitive Status: No family/caregiver present to determine baseline cognitive functioning                                 General Comments: follows commands, answers PLOF questions   General Comments       Exercises     Shoulder Instructions      Home Living Family/patient expects to be discharged to:: Skilled nursing facility  Additional Comments[facility]      Prior Functioning/Environment Level of Independence: Needs assistance  Gait / Transfers Assistance Needed: Per pt he is non ambulatory and stays in bed all day. ADL's / Homemaking Assistance Needed: tA for ADLs, g tube fed, can wash his face, comb his hair            OT Problem List: Decreased strength;Decreased range of motion;Decreased activity tolerance;Impaired balance (sitting and/or standing);Decreased coordination;Decreased cognition;Pain;Impaired UE functional use;Impaired tone      OT Treatment/Interventions:      OT Goals(Current goals can be found in the care plan section) Acute Rehab OT Goals Patient Stated Goal: None stated  OT Frequency:     Barriers to D/C:            Co-evaluation              AM-PAC PT "6 Clicks" Daily Activity     Outcome Measure Help from another person eating meals?: Total Help from another person taking care of personal grooming?: A  Lot Help from another person toileting, which includes using toliet, bedpan, or urinal?: Total Help from another person bathing (including washing, rinsing, drying)?: Total Help from another person to put on and taking off regular upper body clothing?: Total Help from another person to put on and taking off regular lower body clothing?: Total 6 Click Score: 7   End of Session Equipment Utilized During Treatment: Oxygen  Activity Tolerance: Patient tolerated treatment well Patient left: in bed;with call bell/phone within reach;with bed alarm set  OT Visit Diagnosis: Muscle weakness (generalized) (M62.81);Pain;Hemiplegia and hemiparesis;Cognitive communication deficit (R41.841);Other symptoms and signs involving cognitive function Hemiplegia - Right/Left: Left Hemiplegia - dominant/non-dominant: Non-Dominant                Time: 8938-1017 OT Time Calculation (min): 18 min Charges:  OT General Charges $OT Visit: 1 Procedure OT Evaluation $OT Eval Moderate Complexity: 1 Procedure G-Codes:      Evern Bio 04/10/2017, 11:59 AM  [phone]

## 2017-04-10 NOTE — Progress Notes (Signed)
Modified Barium Swallow Progress Note  Patient Details  Name: Curtis Keller MRN: [id number] Date of Birth: 1956-07-02  Today's Date: 04/10/2017  Modified Barium Swallow completed.  Full report located under Chart Review in the Imaging Section.  Brief recommendations include the following:  Clinical Impression  Pt demonstrates ongoing mild to moderate oropharyngeal dysphagia (PMSV placed during MBS, see next note). Primary problems are delayed swallow initiation with liquids, most significant with thin liquids penetrating to the cords before and during the swallow without sensation, with trace coating below the cords. Though hyolaryngeal excursion appears quite good there is moderate vallecular residual with puree, mild with nectar thick liquids that does not clear with multiple swallows. Though pt tolerates puree and nectar thick liquids without aspiration during this study, PO expected to mix with secretions and gradually penetrate the airway with increased potential for particulate matter. A chin tuck was not beneficial and cues throat clear and second swallows did not fully clear any penetrate. Overall, pt has recurrent aspiration pna regardless of PO intake, likely secondary to aspiration of oral bacteria and/or gastric content. Pt is recommended to begin PO with SLP and potentially progress with a dys 1/nectar thick diet if trials at bedside go well. Will need discussion with pts sister to decide plan and wishes for intake.    Swallow Evaluation Recommendations       SLP Diet Recommendations: Dysphagia 1 (Puree) solids;Nectar thick liquid   Liquid Administration via: Cup   Medication Administration: Via alternative means               Oral Care Recommendations: Oral care QID   Other Recommendations: Order thickener from pharmacy   [employer], [place] CCC-SLP [phone]  Claudine Mouton 04/10/2017,12:34 PM

## 2017-04-10 NOTE — Progress Notes (Signed)
@IPLOG         PROGRESS NOTE                                                                                                                                                                                                             Patient Demographics:    Curtis Keller, is a 61 y.o. male, DOB - 12/12/1955, LKH:[id number]  Admit date - 04/07/2017   Admitting Physician Clydie Braun, MD  Outpatient Primary MD for the patient is [facility]  LOS - 3  Chief Complaint  Patient presents with  . Fever  . Shortness of Breath       Brief Narrative   Curtis Keller is a 61 y.o. male with extensive medical history listed below, including tracheostomy dependent, on oxygen via trach, dysphagia,  status post PEG tube feeding, COPD, CVA in February 2017 with residual L hemiparesis/contracturs,  chronic systolic CHF, ICM with EF  25-30 %, CKD 3, chronic anemia, dementia, recurrent falls, recent heat fracture surgery, a history of LV apical thrombosis initially on  Eliquis then switched towarfarin due to development of CVA , history od AAA without rupture as of last discharge, Hep C and recent admission for aspiration pneumonia with hypoxia discharged on 03/30/2017 to SNF, was readmitted on 04/07/2017 for fever and shortness of breath due to pneumonia.   Subjective:   Patient in bed, appears comfortable, denies any headache, no fever, no chest pain or pressure, no shortness of breath , no abdominal pain. No focal weakness.     Assessment  & Plan :     1. Recurrent pneumonia with acute on chronic hypoxic respiratory failure , no sepsis today. Due to recurrent aspiration of gastric contents and oral secretions, improving on empiric IV antibiotics, continue trach collar and nebulizer treatments, trach is in position and was evaluated by critical care, Mucomyst has been added by critical care to augment removal of respiratory secretions. Had tried scopolamine patch but it caused  increased somnolence on 04/10/2017 and it had to be stopped. Discussed plan of care with patient's sister who for now wants full code, full treatment and eventually take the patient home with her. She was not satisfied with SNF care.  2. Chronic systolic heart failure EF 25%, history of LV apical thrombus. On beta blocker via PEG, continue Coumadin, compensated from CHF standpoint, no ACE/ARB/Entresto due to renal insufficiency.   3. CVA and stroke. Dense left-sided hemiparesis and dysphagia, continue Coumadin and statin for secondary prevention, will require discharge to  SNF.   4. Dyslipidemia. On statin.  5. Dysphagia. For now nothing by mouth and PEG tube feeds, will request patient to evaluate one time.  6. Essential hypertension. Continue beta blocker.  7. Anemia of chronic disease. Stable continue to monitor, there was question that his hemoglobin was 4.6 upon admission I think that was a lab.  8. Mild acute renal failure on chronic kidney disease stage IV. Baseline creatinine around 2.4. With gentle hydration back to baseline.  9. Dementia and delirium. Continue Zyprexa.  10. History of nonsustained ventricular tachycardia. EF less than 50%. For now on beta blocker continue.  11. COPD. No wheezing. Supportive care.    Diet : Diet NPO time specified    Family Communication  : None  Code Status :  Ful  Disposition Plan  :  Step Down  Consults  : Requested PCCM to evaluate his trach.  Procedures  :     CT abdomen and pelvis. Nonacute.  DVT Prophylaxis  : Coumadin  Lab Results  Component Value Date   INR 2.43 04/10/2017   INR 2.72 04/09/2017   INR 2.95 04/08/2017   PROTIME 30.2 (A) 03/31/2017   PROTIME 25.4 (A) 07/28/2016   PROTIME 28.5 (A) 07/13/2016     Lab Results  Component Value Date   PLT 206 04/10/2017    Inpatient Medications  Scheduled Meds: . acetylcysteine  4 mL Nebulization BID  . atorvastatin  40 mg Per Tube QHS  . chlorhexidine  15 mL  Mouth Rinse BID  . famotidine  20 mg Per Tube Q24H  . feeding supplement (PRO-STAT SUGAR FREE 64)  60 mL Per Tube BID  . folic acid  1 mg Per Tube Daily  . free water  150 mL Per Tube Q4H  . guaiFENesin  200 mg Per Tube TID  . insulin aspart  0-9 Units Subcutaneous Q4H  . ipratropium-albuterol  3 mL Nebulization QID  . mouth rinse  15 mL Mouth Rinse q12n4p  . metoprolol tartrate  25 mg Per Tube BID  . multivitamin with minerals  1 tablet Per Tube Daily  . OLANZapine  5 mg Per Tube BID  . cyanocobalamin  1,000 mcg Per Tube Daily  . Warfarin - Physician Dosing Inpatient   Does not apply q1800   Continuous Infusions: . sodium chloride 100 mL/hr at 04/10/17 0841  . ceFEPime (MAXIPIME) IV Stopped (04/10/17 0538)  . feeding supplement (OSMOLITE 1.5 CAL) 1,000 mL (04/10/17 0250)  . vancomycin Stopped (04/10/17 0215)   PRN Meds:.albuterol, bisacodyl, [DISCONTINUED] ondansetron **OR** ondansetron (ZOFRAN) IV, polyethylene glycol, senna-docusate  Antibiotics  :    Anti-infectives    Start     Dose/Rate Route Frequency Ordered Stop   04/08/17 2100  vancomycin (VANCOCIN) 1,250 mg in sodium chloride 0.9 % 250 mL IVPB     1,250 mg 166.7 mL/hr over 90 Minutes Intravenous Every 24 hours 04/07/17 1951     04/08/17 1500  vancomycin (VANCOCIN) 1,250 mg in sodium chloride 0.9 % 250 mL IVPB  Status:  Discontinued     1,250 mg 166.7 mL/hr over 90 Minutes Intravenous Every 24 hours 04/07/17 1542 04/07/17 1951   04/08/17 1400  vancomycin (VANCOCIN) 1,250 mg in sodium chloride 0.9 % 250 mL IVPB  Status:  Discontinued     1,250 mg 166.7 mL/hr over 90 Minutes Intravenous Every 24 hours 04/07/17 1446 04/07/17 1542   04/07/17 2300  ceFEPIme (MAXIPIME) 1 g in dextrose 5 % 50 mL IVPB  1 g 100 mL/hr over 30 Minutes Intravenous Every 24 hours 04/07/17 1536     04/07/17 1500  vancomycin (VANCOCIN) 1,500 mg in sodium chloride 0.9 % 500 mL IVPB     1,500 mg 250 mL/hr over 120 Minutes Intravenous  Once  04/07/17 1446 04/07/17 2257   04/07/17 1445  piperacillin-tazobactam (ZOSYN) IVPB 2.25 g  Status:  Discontinued     2.25 g 100 mL/hr over 30 Minutes Intravenous  Once 04/07/17 1433 04/07/17 1438   04/07/17 1445  piperacillin-tazobactam (ZOSYN) IVPB 3.375 g     3.375 g 100 mL/hr over 30 Minutes Intravenous  Once 04/07/17 1439 04/07/17 1537         Objective:   Vitals:   04/10/17 0500 04/10/17 0600 04/10/17 0811 04/10/17 0857  BP: 100/73 117/78 108/84   Pulse: 77 77 67 74  Resp: (!) 24 (!) 21 17 (!) 22  Temp:   98.2 F (36.8 C)   TempSrc:   Oral   SpO2: 91% 95% 95% 94%  Weight:      Height:        Wt Readings from Last 3 Encounters:  04/10/17 97.6 kg (215 lb 2.7 oz)  04/04/17 92.5 kg (204 lb)  04/03/17 92.5 kg (204 lb)     Intake/Output Summary (Last 24 hours) at 04/10/17 1114 Last data filed at 04/10/17 0600  Gross per 24 hour  Intake          1462.66 ml  Output             1050 ml  Net           412.66 ml     Physical Exam  Awake Alert, Dense left-sided hemiparesis, PEG tube in place, wearing trach collar [place].AT,PERRAL Supple Neck,No JVD, No cervical lymphadenopathy appriciated.  Symmetrical Chest wall movement, Good air movement bilaterally, minimal rales RRR,No Gallops,Rubs or new Murmurs, No Parasternal Heave +ve B.Sounds, Abd Soft, No tenderness, No organomegaly appriciated, No rebound - guarding or rigidity. No Cyanosis, Clubbing or edema, No new Rash or bruise    Data Review:    CBC  Recent Labs Lab 04/07/17 1243 04/07/17 1426 04/08/17 0429 04/09/17 0545 04/10/17 0311  WBC 25.3*  --  11.3* 11.3* 11.5*  HGB 4.6* 11.6* 12.7* 11.8* 12.1*  HCT 14.7* 34.0* 39.9 37.7* 38.6*  PLT 389  --  220 205 206  MCV 101.4*  --  96.8 97.9 98.0  MCH 31.7  --  30.8 30.6 30.7  MCHC 31.3  --  31.8 31.3 31.3  RDW 15.0  --  17.2* 16.6* 15.7*  LYMPHSABS 7.6*  --   --   --   --   MONOABS 1.3*  --   --   --   --   EOSABS 2.5*  --   --   --   --   BASOSABS 0.0  --    --   --   --     Chemistries   Recent Labs Lab 04/07/17 1243 04/07/17 1426 04/08/17 0429 04/09/17 0545  NA 143 146* 142 142  K 4.0 3.7 4.0 3.7  CL 107 109 108 110  CO2 27  --  26 25  GLUCOSE 102* 98 146* 135*  BUN 52* 48* 50* 48*  CREATININE 2.59* 2.60* 2.43* 2.08*  CALCIUM 9.7  --  9.3 9.2  AST 38  --  34 27  ALT 28  --  26 21  ALKPHOS 109  --  105 129*  BILITOT  0.6  --  0.8 0.6   ------------------------------------------------------------------------------------------------------------------ No results for input(s): CHOL, HDL, LDLCALC, TRIG, CHOLHDL, LDLDIRECT in the last 72 hours.  Lab Results  Component Value Date   HGBA1C 5.3 03/22/2017   ------------------------------------------------------------------------------------------------------------------ No results for input(s): TSH, T4TOTAL, T3FREE, THYROIDAB in the last 72 hours.  Invalid input(s): FREET3 ------------------------------------------------------------------------------------------------------------------ No results for input(s): VITAMINB12, FOLATE, FERRITIN, TIBC, IRON, RETICCTPCT in the last 72 hours.  Coagulation profile  Recent Labs Lab 04/07/17 1243 04/08/17 0429 04/09/17 0545 04/10/17 0311  INR 2.72 2.95 2.72 2.43    No results for input(s): DDIMER in the last 72 hours.  Cardiac Enzymes No results for input(s): CKMB, TROPONINI, MYOGLOBIN in the last 168 hours.  Invalid input(s): CK ------------------------------------------------------------------------------------------------------------------    Component Value Date/Time   BNP 199.5 (H) 03/30/2017 1610    Micro Results Recent Results (from the past 240 hour(s))  Blood Culture (routine x 2)     Status: None (Preliminary result)   Collection Time: 04/07/17 12:50 PM  Result Value Ref Range Status   Specimen Description BLOOD RIGHT FOREARM  Final   Special Requests   Final    BOTTLES DRAWN AEROBIC AND ANAEROBIC Blood Culture  adequate volume   Culture NO GROWTH 2 DAYS  Final   Report Status PENDING  Incomplete  Urine culture     Status: None   Collection Time: 04/07/17  3:12 PM  Result Value Ref Range Status   Specimen Description URINE, CATHETERIZED  Final   Special Requests NONE  Final   Culture NO GROWTH  Final   Report Status 04/08/2017 FINAL  Final  Blood Culture (routine x 2)     Status: None (Preliminary result)   Collection Time: 04/07/17  3:44 PM  Result Value Ref Range Status   Specimen Description BLOOD RIGHT FOREARM  Final   Special Requests IN PEDIATRIC BOTTLE Blood Culture adequate volume  Final   Culture NO GROWTH 2 DAYS  Final   Report Status PENDING  Incomplete    Radiology Reports Ct Abdomen Pelvis Wo Contrast  Result Date: 04/07/2017 CLINICAL DATA:  Anemia history of aortic aneurysm EXAM: CT ABDOMEN AND PELVIS WITHOUT CONTRAST TECHNIQUE: Multidetector CT imaging of the abdomen and pelvis was performed following the standard protocol without IV contrast. COMPARISON:  01/30/2017 FINDINGS: Lower chest: Patchy bibasilar opacities. No pleural effusion. Cardiomegaly with coronary artery calcification. Calcifications at the ventricular apex may relate to old infarct. Hepatobiliary: No focal hepatic abnormality. Possible small stones or sludge in the gallbladder. No biliary dilatation Pancreas: Unremarkable. No pancreatic ductal dilatation or surrounding inflammatory changes. Spleen: Small splenic tissue in the left upper quadrant with adjacent surgical change or calcification. Adrenals/Urinary Tract: Adrenal glands are within normal limits. Punctate nonobstructing stone in the mid right kidney. No hydronephrosis. The bladder is unremarkable Stomach/Bowel: Stomach is nonenlarged. There is a gastrostomy tube. No dilated small bowel. No colon wall thickening. Vascular/Lymphatic: Atherosclerotic vascular disease. No aneurysmal dilatation. No significantly enlarged lymph nodes. Reproductive: Prostate glands  calcifications. Other: Ventral hernia containing mesenteric fat and small amount of bowel. No free air or free fluid Musculoskeletal: Status post left hip replacement with associated artifact. Probable old fracture left inferior pubic ramus. Old right-sided rib fractures. IMPRESSION: 1. Aortic atherosclerotic vascular disease but no aneurysmal dilatation. No evidence for retroperitoneal mass or hematoma. 2. Patchy bibasilar lung opacities, not significantly changed 3. Punctate nonobstructing stone in the mid right kidney 4. Possible small stones or sludge in the gallbladder 5. Ventral hernia containing mesenteric fat and  small amount of bowel but no evidence for an obstruction Electronically Signed   By: Jasmine Pang M.D.   On: 04/07/2017 22:43   Dg Chest 2 View  Result Date: 03/17/2017 CLINICAL DATA:  61 year old with chronic ventilator dependent respiratory failure presenting with cough. Current history of chronic systolic congestive heart failure, hypertension, cardiomyopathy and COPD. EXAM: CHEST  2 VIEW 3:48 p.m.: COMPARISON:  Portable chest x-ray earlier today 1:43 p.m., 03/06/2017 and earlier. FINDINGS: AP erect and lateral images were obtained. Tracheostomy tube tip in satisfactory position below the thoracic inlet projecting approximately 5 cm above carina. Patchy airspace opacities in the lung bases, left greater than right, similar to the examination earlier today, new since 03/06/2017. Slight improvement in the interstitial pulmonary edema since earlier today. No new pulmonary parenchymal abnormalities since earlier today. IMPRESSION: 1. Tracheostomy tube tip in satisfactory position approximately 5 cm above carina. 2. Pneumonia involving the lung bases, left greater than right. 3. Improving interstitial pulmonary edema. Electronically Signed   By: Hulan Saas M.D.   On: 03/17/2017 16:14   Ct Chest Wo Contrast  Result Date: 03/29/2017 CLINICAL DATA:  Shortness of breath, history respiratory  failure requiring increased oxygen usage today, tracheostomy dependent, denies chest pain EXAM: CT CHEST WITHOUT CONTRAST TECHNIQUE: Multidetector CT imaging of the chest was performed following the standard protocol without IV contrast. Sagittal and coronal MPR images reconstructed from axial data set. COMPARISON:  12/23/2016 FINDINGS: Cardiovascular: Atherosclerotic calcifications aorta, proximal great vessels, and coronary arteries ascending aorta aneurysmally dilated 4.5 cm transverse image 62. No pericardial effusion. Calcifications at cardiac apex question prior MI. Mediastinum/Nodes: Esophagus unremarkable. Tracheostomy tube within trachea. Scattered normal sized mediastinal lymph nodes largest 10 mm RIGHT paratracheal node image 47 and 10 mm para-aortic node image 53. Base of cervical region unremarkable. Lungs/Pleura: Scattered peripheral chronic interstitial infiltrates in both lungs. Dependent atelectasis in both lower lobes improved since previous exam. Central peribronchial thickening greatest in lower lobes. Scattered subpleural blebs at the apices greater on RIGHT. RIGHT upper lobe nodular density 9 mm diameter image 42 unchanged. No pleural effusion or pneumothorax. Upper Abdomen: Gastrostomy tube within collapsed stomach. Post splenectomy with question splenule at splenectomy bed. Visualized upper abdomen otherwise unremarkable. Musculoskeletal: Old appearing compression deformity of an upper thoracic vertebra approximately T6. Bones demineralized. No other focal bony abnormalities. IMPRESSION: Aortic atherosclerosis and coronary arterial calcifications aneurysmal dilatation of the ascending thoracic aorta 4.5 cm transverse, recommendation below. Ascending thoracic aortic aneurysm. Recommend semi-annual imaging followup by CTA or MRA and referral to cardiothoracic surgery if not already obtained. This recommendation follows 2010 ACCF/AHA/AATS/ACR/ASA/SCA/SCAI/SIR/STS/SVM Guidelines for the Diagnosis  and Management of Patients With Thoracic Aortic Disease. Circulation. 2010; 121: W098-J191 Mural calcification at cardiac apex question prior MI. Bronchitic changes with scattered chronic peripheral interstitial infiltrates in both lungs and improved bibasilar atelectasis. No definite acute infiltrate. Stable 9 mm RIGHT upper lobe nodule. Upper normal sized mediastinal lymph nodes without definite thoracic adenopathy. Electronically Signed   By: Ulyses Southward M.D.   On: 03/29/2017 12:45   Dg Chest Port 1 View  Result Date: 04/10/2017 CLINICAL DATA:  Short of breath EXAM: PORTABLE CHEST 1 VIEW COMPARISON:  04/08/2017 FINDINGS: Cardiac enlargement. Progression of bilateral airspace disease most likely due to pulmonary edema. Bibasilar airspace disease with some progression on the right. IMPRESSION: Mild progression of bilateral airspace disease.  Probable edema. Progression of right lower lobe airspace disease. Possible pneumonia Electronically Signed   By: Marlan Palau M.D.   On: 04/10/2017 07:30  Dg Chest Port 1 View  Result Date: 04/08/2017 CLINICAL DATA:  Tracheostomy tube malfunctioning. Initial encounter. EXAM: PORTABLE CHEST 1 VIEW COMPARISON:  Chest radiograph performed earlier today at 6:44 a.m. FINDINGS: The patient's tracheostomy tube is seen ending 7-8 cm above the carina. It is grossly stable in appearance. Patchy bibasilar airspace opacities raise concern for multifocal pneumonia, redistributed from the prior study, though interstitial edema might have a similar appearance. Underlying vascular congestion is noted. No pleural effusion or pneumothorax is seen, though the left costophrenic angle is incompletely imaged on this study. The cardiomediastinal silhouette is borderline enlarged. No acute osseous abnormalities are seen. There is chronic deformity of the right mid clavicle. IMPRESSION: 1. Tracheostomy tube seen ending 7-8 cm above the carina. It is grossly stable in appearance. 2. Patchy  bibasilar airspace opacities raise concern for multifocal pneumonia, redistributed from the recent prior study, though interstitial edema might have a similar appearance. 3. Underlying vascular congestion and borderline cardiomegaly. Electronically Signed   By: [name] M.D.   On: 04/08/2017 21:37   Dg Chest Port 1 View  Result Date: 04/08/2017 CLINICAL DATA:  Sepsis EXAM: PORTABLE CHEST 1 VIEW COMPARISON:  04/07/2017 FINDINGS: Left lower lobe airspace disease unchanged. Mild right upper lobe airspace disease has progressed. Negative for effusion. Tracheostomy remains in good position. IMPRESSION: Left lower lobe airspace disease unchanged. Progression of mild right upper lobe airspace disease. Possible pneumonia. Electronically Signed   By: Marlan Palau M.D.   On: 04/08/2017 09:34   Dg Chest Port 1 View  Result Date: 04/07/2017 CLINICAL DATA:  Short of breath fever EXAM: PORTABLE CHEST 1 VIEW COMPARISON:  03/27/2017 FINDINGS: Tracheostomy remains in good position. Bibasilar airspace disease is mildly improved. Negative for heart failure or effusion. IMPRESSION: Partial clearing of bibasilar atelectasis/infiltrate. Electronically Signed   By: Marlan Palau M.D.   On: 04/07/2017 13:01   Dg Chest Port 1 View  Result Date: 03/27/2017 CLINICAL DATA:  Shortness of Breath EXAM: PORTABLE CHEST 1 VIEW COMPARISON:  March 24, 2017 FINDINGS: Tracheostomy catheter tip is 4.5 cm above the carina. No pneumothorax. There is patchy airspace consolidation in the left base. There is mild bibasilar atelectasis as well. Heart is borderline enlarged with pulmonary vascularity within normal limits. No adenopathy. No bone lesions. IMPRESSION: Tracheostomy as described without pneumothorax. Patchy airspace opacity left base, suspect a degree of pneumonia. Patchy atelectasis in the bases as well. Stable cardiac silhouette. Electronically Signed   By: Bretta Bang III M.D.   On: 03/27/2017 09:32   Dg Chest Port 1  View  Result Date: 03/24/2017 CLINICAL DATA:  Hypoxia EXAM: PORTABLE CHEST 1 VIEW COMPARISON:  Two days ago FINDINGS: Tracheostomy tube in place. Cardiomegaly and diffuse interstitial opacity. Low lung volumes. Small layering pleural effusions are not excluded. No evidence of pneumothorax. IMPRESSION: 1. Cardiomegaly and mild pulmonary edema. 2. Low volume chest with atelectasis. 3. No change from study 2 days prior. Electronically Signed   By: Marnee Spring M.D.   On: 03/24/2017 14:47   Dg Chest Port 1 View  Result Date: 03/22/2017 CLINICAL DATA:  Hypoxia EXAM: PORTABLE CHEST 1 VIEW COMPARISON:  03/19/2017; 03/17/2017; 03/06/2017 FINDINGS: Grossly unchanged enlarged cardiac silhouette and mediastinal contours given AP projection. Stable position of support apparatus. Pulmonary vasculature remains indistinct with cephalization of flow. Minimally improved aeration of lungs with persistent bibasilar opacities, left greater than right. No new focal airspace opacities. No definite pleural effusion or pneumothorax. No acute osseus abnormalities. IMPRESSION: 1.  Stable positioning  of support apparatus.  No pneumothorax. 2. Minimally improved bibasilar atelectasis with otherwise similar findings of mild pulmonary edema. Electronically Signed   By: [name] M.D.   On: 03/22/2017 09:15   Dg Chest Port 1 View  Result Date: 03/19/2017 CLINICAL DATA:  Shortness of breath EXAM: PORTABLE CHEST 1 VIEW COMPARISON:  03/17/2017; 03/06/2017; 01/27/2017 FINDINGS: Unchanged enlarged cardiac silhouette and mediastinal contours. Stable position of support apparatus. Pulmonary vasculature appears less distinct with cephalization of flow. Interval development of trace right and small left-sided effusion with associated worsening bibasilar opacities, left greater than right. No pneumothorax. No acute osseus abnormalities. Old right anterior rib fractures. IMPRESSION: 1.  No acute cardiopulmonary disease. 2. Suspected worsening  pulmonary edema and bibasilar atelectasis, left greater than right. Electronically Signed   By: [name] M.D.   On: 03/19/2017 13:40   Dg Chest Port 1 View  Result Date: 03/17/2017 CLINICAL DATA:  Shortness of breath EXAM: PORTABLE CHEST 1 VIEW COMPARISON:  03/06/2017, 02/20/2017, 02/08/2017 FINDINGS: Tracheostomy tube tip about 2.4 cm superior to the carina and projects over the tracheal air column. Cardiomegaly with central vascular congestion and mild interstitial edema. Streaky opacity at the left base may reflect atelectasis or an infiltrate. Old right clavicular fracture and old rib fractures. IMPRESSION: Tracheostomy tube tip projects over tracheal air column and tip is about 2.4 cm superior to carina Cardiomegaly with central vascular congestion and mild diffuse edema. Streaky atelectasis or pneumonia at the left base. Electronically Signed   By: Jasmine Pang M.D.   On: 03/17/2017 14:02    Time Spent in minutes  30   Susa Raring M.D on 04/10/2017 at 11:14 AM  Between 7am to 7pm - Pager - [phone] ( page via [email or web address], text pages only, please mention full 10 digit call back number). After 7pm go to [email or web address] - password [email or web address]

## 2017-04-11 DIAGNOSIS — J9503 Malfunction of tracheostomy stoma: Secondary | ICD-10-CM | POA: Diagnosis not present

## 2017-04-11 DIAGNOSIS — J690 Pneumonitis due to inhalation of food and vomit: Secondary | ICD-10-CM | POA: Diagnosis not present

## 2017-04-11 DIAGNOSIS — I513 Intracardiac thrombosis, not elsewhere classified: Secondary | ICD-10-CM | POA: Diagnosis not present

## 2017-04-11 DIAGNOSIS — R0602 Shortness of breath: Secondary | ICD-10-CM | POA: Diagnosis not present

## 2017-04-11 LAB — GLUCOSE, CAPILLARY
GLUCOSE-CAPILLARY: 142 mg/dL — AB (ref 65–99)
Glucose-Capillary: 132 mg/dL — ABNORMAL HIGH (ref 65–99)
Glucose-Capillary: 173 mg/dL — ABNORMAL HIGH (ref 65–99)
Glucose-Capillary: 130 mg/dL — ABNORMAL HIGH (ref 65–99)

## 2017-04-11 LAB — BASIC METABOLIC PANEL
ANION GAP: 7 (ref 5–15)
BUN: 51 mg/dL — ABNORMAL HIGH (ref 6–20)
CHLORIDE: 108 mmol/L (ref 101–111)
CO2: 25 mmol/L (ref 22–32)
Calcium: 9.2 mg/dL (ref 8.9–10.3)
Creatinine, Ser: 1.73 mg/dL — ABNORMAL HIGH (ref 0.61–1.24)
GFR calc non Af Amer: 41 mL/min — ABNORMAL LOW (ref >60)
GFR, EST AFRICAN AMERICAN: 47 mL/min — AB (ref >60)
GLUCOSE: 128 mg/dL — AB (ref 65–99)
POTASSIUM: 4.3 mmol/L (ref 3.5–5.1)
SODIUM: 140 mmol/L (ref 135–145)

## 2017-04-11 LAB — CBC
HCT: 38.6 % — ABNORMAL LOW (ref 39.0–52.0)
Hemoglobin: 11.9 g/dL — ABNORMAL LOW (ref 13.0–17.0)
MCH: 30.3 pg (ref 26.0–34.0)
MCHC: 30.8 g/dL (ref 30.0–36.0)
MCV: 98.2 fL (ref 78.0–100.0)
PLATELETS: 218 10*3/uL (ref 150–400)
RBC: 3.93 MIL/uL — AB (ref 4.22–5.81)
RDW: 15.5 % (ref 11.5–15.5)
WBC: 11.6 10*3/uL — ABNORMAL HIGH (ref 4.0–10.5)

## 2017-04-11 LAB — PROTIME-INR
INR: 1.68
PROTHROMBIN TIME: 20 s — AB (ref 11.4–15.2)

## 2017-04-11 LAB — PATHOLOGIST SMEAR REVIEW

## 2017-04-11 LAB — MAGNESIUM: MAGNESIUM: 2 mg/dL (ref 1.7–2.4)

## 2017-04-11 MED ORDER — AMOXICILLIN-POT CLAVULANATE 400-57 MG/5ML PO SUSR
800.0000 mg | Freq: Two times a day (BID) | ORAL | Status: DC
  Administered 2017-04-11 – 2017-04-13 (×5): 800 mg
  Filled 2017-04-11 (×7): qty 10

## 2017-04-11 MED ORDER — WARFARIN SODIUM 6 MG PO TABS
6.0000 mg | ORAL_TABLET | Freq: Every day | ORAL | Status: DC
  Administered 2017-04-11: 6 mg
  Filled 2017-04-11 (×4): qty 1

## 2017-04-11 MED ORDER — HALOPERIDOL LACTATE 5 MG/ML IJ SOLN: 2.0000 mg | Freq: Four times a day (QID) | INTRAMUSCULAR | Status: DC | PRN

## 2017-04-11 NOTE — Care Management Important Message (Signed)
Important Message  Patient Details  Name: Curtis Keller MRN: [id number] Date of Birth: July 19, 1956   Medicare Important Message Given:  Yes    [name], RN 04/11/2017, 12:42 PMImportant Message  Patient Details  Name: Curtis Keller MRN: [id number] Date of Birth: 1956-05-29   Medicare Important Message Given:  Yes    [name], RN 04/11/2017, 12:42 PM

## 2017-04-11 NOTE — Clinical Social Work Note (Signed)
Clinical Social Work Assessment  Patient Details  Name: Curtis Keller MRN: [id number] Date of Birth: 1955-12-17  Date of referral:  04/11/17               Reason for consult:  Facility Placement                Permission sought to share information with:  Facility Industrial/product designer granted to share information::     Name::     Multimedia programmer::  [facility]  Relationship::  sister  Contact Information:     Housing/Transportation Living arrangements for the past 2 months:  Skilled Building surveyor of Information:  Other (Comment Required) (sibling) Patient Interpreter Needed:  None Criminal Activity/Legal Involvement Pertinent to Current Situation/Hospitalization:  No - Comment as needed Significant Relationships:  Siblings Lives with:  Facility Resident Do you feel safe going back to the place where you live?  Yes Need for family participation in patient care:  Yes (Comment) (decision making and care coordination)  Care giving concerns:  Pt was living at [facility] for about a year then had to be transferred to [facility]- pt sister does not think [facility] has done an efficient job of handling patients trach needs and is not sure she feels safe with him returning there.   Social Worker assessment / plan:  CSW spoke with patient concerning discharge planning.  CSW discussed home health in basic details with pt sister since she had inquired about taking home.  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  Discussed possible barriers to patient return home- sister states he would not have his own room and sounds like his hospital bed would have to be in the living room/kitchen area.  Sister still seriously considering this option because right now she is coming to SNF 5-7 days a week to supervise patient though she lives 55 miles away.  CSW then spoke  with sister about SNF options.  Informed that we could see if other LTC options that accept trachs were available that we may have no choice but for pt to return to [facility] where he has a bed until sister can take him home or find alternative facility option.  Sister understands limitations of immediate facility change and is willing to talk to [employer] about her concerns to see if they can work through her complaints.  Employment status:  Disabled (Comment on whether or not currently receiving Disability) Insurance information:  Medicare PT Recommendations:  Skilled Nursing Facility Information / Referral to community resources:  Skilled Nursing Facility  Patient/Family's Response to care:  Sister is frustrated with current care but open to returning or to look into alternative options.  Patient/Family's Understanding of and Emotional Response to Diagnosis, Current Treatment, and Prognosis:  Patient sister frustrated with lack of options for patients with trachs and upset that she can't take better care of patient at this time.  Emotional Assessment Appearance:  Appears stated age Attitude/Demeanor/Rapport:  Unable to Assess Affect (typically observed):  Unable to Assess Orientation:    Alcohol / Substance use:  Not Applicable Psych involvement (Current and /or in the community):  No (Comment)  Discharge Needs  Concerns to be addressed:  Care Coordination Readmission within the last 30 days:  Yes Current discharge risk:  Dependent with Mobility Barriers to Discharge:  Continued Medical Work up   [employer],  Wyn Quaker, LCSW 04/11/2017, 4:19 PM

## 2017-04-11 NOTE — Progress Notes (Signed)
PT Cancellation Note  Patient Details Name: Curtis Keller MRN: [id number] DOB: 1955-12-13   Cancelled Treatment:    Reason Eval/Treat Not Completed: Patient's level of consciousness Pt did not sleep much last night. Difficult to arouse and declined mobility. Will follow up.   Blake Divine A Laurella Tull 04/11/2017, 1:15 PM Mylo Red, PT, [name] [phone]

## 2017-04-11 NOTE — Progress Notes (Signed)
@IPLOG         PROGRESS NOTE                                                                                                                                                                                                             Patient Demographics:    Curtis Keller, is a 61 y.o. male, DOB - 12-24-1955, WUJ:[id number]  Admit date - 04/07/2017   Admitting Physician Clydie Braun, MD  Outpatient Primary MD for the patient is [facility]  LOS - 4  Chief Complaint  Patient presents with  . Fever  . Shortness of Breath       Brief Narrative   Curtis Keller is a 61 y.o. male with extensive medical history listed below, including tracheostomy dependent, on oxygen via trach, dysphagia,  status post PEG tube feeding, COPD, CVA in February 2017 with residual L hemiparesis/contracturs,  chronic systolic CHF, ICM with EF  25-30 %, CKD 3, chronic anemia, dementia, recurrent falls, recent heat fracture surgery, a history of LV apical thrombosis initially on  Eliquis then switched towarfarin due to development of CVA , history od AAA without rupture as of last discharge, Hep C and recent admission for aspiration pneumonia with hypoxia discharged on 03/30/2017 to SNF, was readmitted on 04/07/2017 for fever and shortness of breath due to pneumonia.  Patient now improves will transfer him out of stepdown on 04/11/2017, discussed at length with patient's sister on 04/10/2017, she agrees that he has poor quality of life but for now she would want full medical treatment and full code, she will eventually want to take him home with her and not to SNF. Case management and social work consulted for the same.   Subjective:   Patient in bed, appears comfortable, denies any headache, no fever, no chest pain or pressure, no shortness of breath , no abdominal pain. No focal weakness.    Assessment  & Plan :     1. Recurrent pneumonia with acute on chronic hypoxic respiratory failure ,  no sepsis today. Due to recurrent aspiration of gastric contents and oral secretions, improving on empiric IV antibiotics, continue trach collar and nebulizer treatments, trach is in position and was evaluated by critical care, Mucomyst has been added by critical care to augment removal of respiratory secretions.   Had tried scopolamine patch but it caused increased somnolence on 04/10/2017 and it had to be stopped. Discussed at length his plan of care with his sister on 04/10/2017, she understands that he has  poor quality of life however she wants full code and full treatment for now, she also wishes to take him home from hospital and not to a SNF. Overall clinically on 04/11/2017 he is somewhat improved, we'll transition to a single oral antibiotic via PEG tube, transfer him out of stepdown and monitor for a day. Case management requested to arrange for home discharge. If stable likely home discharge in 1 or 2 days.   2. Chronic systolic heart failure EF 25%, history of LV apical thrombus. He is on beta blocker via PEG tube, continue Coumadin which is being monitored by pharmacy, compensated from CHF standpoint. No ACE/ARB/Entresto due to underlying renal insufficiency.   3. CVA and stroke. Dense left-sided hemiparesis and dysphagia, continue Coumadin and statin for secondary prevention, will require discharge to SNF.   4. Dyslipidemia. Continue on statin via PEG tube  5. Dysphagia and aphonia. All medications and diet via PEG tube, speech therapy following, I see a note where dysphagia 1 diet and nectar thick liquids were mentioned to be considered. At this time I'm not comfortable starting it has I think risk of aspiration is very high.  6. Essential hypertension. Continue beta blocker.  7. Anemia of chronic disease. Stable continue to monitor, there was question that his hemoglobin was 4.6 upon admission I think that was a lab.  8. Mild acute renal failure on chronic kidney disease stage IV.  Baseline creatinine around 2.4. With gentle hydration back to baseline.  9. Dementia and delirium. Continue Zyprexa.  10. History of nonsustained ventricular tachycardia. EF less than 50%. For now on beta blocker continue.  11. COPD. No wheezing. Supportive care.    Diet : Diet NPO time specified    Family Communication  : None  Code Status :  Ful  Disposition Plan  :  Step Down  Consults  : Requested PCCM to evaluate his trach.  Procedures  :     CT abdomen and pelvis. Nonacute.  DVT Prophylaxis  : Coumadin  Lab Results  Component Value Date   INR 1.68 04/11/2017   INR 2.43 04/10/2017   INR 2.72 04/09/2017   PROTIME 30.2 (A) 03/31/2017   PROTIME 25.4 (A) 07/28/2016   PROTIME 28.5 (A) 07/13/2016     Lab Results  Component Value Date   PLT 218 04/11/2017    Inpatient Medications  Scheduled Meds: . acetylcysteine  4 mL Nebulization BID  . amoxicillin-clavulanate  800 mg Per Tube Q12H  . atorvastatin  40 mg Per Tube QHS  . chlorhexidine  15 mL Mouth Rinse BID  . famotidine  20 mg Per Tube Q24H  . feeding supplement (PRO-STAT SUGAR FREE 64)  60 mL Per Tube BID  . folic acid  1 mg Per Tube Daily  . free water  150 mL Per Tube Q4H  . guaiFENesin  200 mg Per Tube TID  . insulin aspart  0-9 Units Subcutaneous Q4H  . ipratropium-albuterol  3 mL Nebulization QID  . mouth rinse  15 mL Mouth Rinse q12n4p  . metoprolol tartrate  25 mg Per Tube BID  . multivitamin with minerals  1 tablet Per Tube Daily  . OLANZapine  5 mg Per Tube BID  . cyanocobalamin  1,000 mcg Per Tube Daily  . warfarin  6 mg Per Tube Daily  . Warfarin - Physician Dosing Inpatient   Does not apply q1800   Continuous Infusions: . feeding supplement (OSMOLITE 1.5 CAL) 1,000 mL (04/10/17 1847)   PRN Meds:.albuterol, bisacodyl,  haloperidol lactate, [DISCONTINUED] ondansetron **OR** ondansetron (ZOFRAN) IV, polyethylene glycol, senna-docusate  Antibiotics  :    Anti-infectives    Start      Dose/Rate Route Frequency Ordered Stop   04/11/17 1000  amoxicillin-clavulanate (AUGMENTIN) 400-57 MG/5ML suspension 800 mg     800 mg Per Tube Every 12 hours 04/11/17 0759     04/08/17 2100  vancomycin (VANCOCIN) 1,250 mg in sodium chloride 0.9 % 250 mL IVPB  Status:  Discontinued     1,250 mg 166.7 mL/hr over 90 Minutes Intravenous Every 24 hours 04/07/17 1951 04/11/17 0759   04/08/17 1500  vancomycin (VANCOCIN) 1,250 mg in sodium chloride 0.9 % 250 mL IVPB  Status:  Discontinued     1,250 mg 166.7 mL/hr over 90 Minutes Intravenous Every 24 hours 04/07/17 1542 04/07/17 1951   04/08/17 1400  vancomycin (VANCOCIN) 1,250 mg in sodium chloride 0.9 % 250 mL IVPB  Status:  Discontinued     1,250 mg 166.7 mL/hr over 90 Minutes Intravenous Every 24 hours 04/07/17 1446 04/07/17 1542   04/07/17 2300  ceFEPIme (MAXIPIME) 1 g in dextrose 5 % 50 mL IVPB  Status:  Discontinued     1 g 100 mL/hr over 30 Minutes Intravenous Every 24 hours 04/07/17 1536 04/11/17 0759   04/07/17 1500  vancomycin (VANCOCIN) 1,500 mg in sodium chloride 0.9 % 500 mL IVPB     1,500 mg 250 mL/hr over 120 Minutes Intravenous  Once 04/07/17 1446 04/07/17 2257   04/07/17 1445  piperacillin-tazobactam (ZOSYN) IVPB 2.25 g  Status:  Discontinued     2.25 g 100 mL/hr over 30 Minutes Intravenous  Once 04/07/17 1433 04/07/17 1438   04/07/17 1445  piperacillin-tazobactam (ZOSYN) IVPB 3.375 g     3.375 g 100 mL/hr over 30 Minutes Intravenous  Once 04/07/17 1439 04/07/17 1537         Objective:   Vitals:   04/10/17 2334 04/11/17 0343 04/11/17 0442 04/11/17 0800  BP: 109/70 122/80  113/71  Pulse: 78 84  91  Resp: (!) 26 20 (!) 21 20  Temp:  98.2 F (36.8 C)  98.9 F (37.2 C)  TempSrc:  Oral  Oral  SpO2: 93% 91% 92% 92%  Weight:  93.2 kg (205 lb 7.5 oz)    Height:        Wt Readings from Last 3 Encounters:  04/11/17 93.2 kg (205 lb 7.5 oz)  04/04/17 92.5 kg (204 lb)  04/03/17 92.5 kg (204 lb)     Intake/Output  Summary (Last 24 hours) at 04/11/17 0857 Last data filed at 04/11/17 0630  Gross per 24 hour  Intake             1819 ml  Output              850 ml  Net              969 ml     Physical Exam  Awake Alert, Dense left-sided hemiparesis, PEG tube in place, wearing trach collar [place].AT,PERRAL Supple Neck,No JVD, No cervical lymphadenopathy appriciated.  Symmetrical Chest wall movement, Good air movement bilaterally, few rales RRR,No Gallops,Rubs or new Murmurs, No Parasternal Heave +ve B.Sounds, Abd Soft, No tenderness, No organomegaly appriciated, No rebound - guarding or rigidity. No Cyanosis, Clubbing or edema, No new Rash or bruise     Data Review:    CBC  Recent Labs Lab 04/07/17 1243 04/07/17 1426 04/08/17 0429 04/09/17 0545 04/10/17 0311 04/11/17 0359  WBC 25.3*  --  11.3* 11.3* 11.5* 11.6*  HGB 4.6* 11.6* 12.7* 11.8* 12.1* 11.9*  HCT 14.7* 34.0* 39.9 37.7* 38.6* 38.6*  PLT 389  --  220 205 206 218  MCV 101.4*  --  96.8 97.9 98.0 98.2  MCH 31.7  --  30.8 30.6 30.7 30.3  MCHC 31.3  --  31.8 31.3 31.3 30.8  RDW 15.0  --  17.2* 16.6* 15.7* 15.5  LYMPHSABS 7.6*  --   --   --   --   --   MONOABS 1.3*  --   --   --   --   --   EOSABS 2.5*  --   --   --   --   --   BASOSABS 0.0  --   --   --   --   --     Chemistries   Recent Labs Lab 04/07/17 1243 04/07/17 1426 04/08/17 0429 04/09/17 0545 04/11/17 0359  NA 143 146* 142 142 140  K 4.0 3.7 4.0 3.7 4.3  CL 107 109 108 110 108  CO2 27  --  26 25 25   GLUCOSE 102* 98 146* 135* 128*  BUN 52* 48* 50* 48* 51*  CREATININE 2.59* 2.60* 2.43* 2.08* 1.73*  CALCIUM 9.7  --  9.3 9.2 9.2  AST 38  --  34 27  --   ALT 28  --  26 21  --   ALKPHOS 109  --  105 129*  --   BILITOT 0.6  --  0.8 0.6  --    ------------------------------------------------------------------------------------------------------------------ No results for input(s): CHOL, HDL, LDLCALC, TRIG, CHOLHDL, LDLDIRECT in the last 72 hours.  Lab Results   Component Value Date   HGBA1C 5.3 03/22/2017   ------------------------------------------------------------------------------------------------------------------ No results for input(s): TSH, T4TOTAL, T3FREE, THYROIDAB in the last 72 hours.  Invalid input(s): FREET3 ------------------------------------------------------------------------------------------------------------------ No results for input(s): VITAMINB12, FOLATE, FERRITIN, TIBC, IRON, RETICCTPCT in the last 72 hours.  Coagulation profile  Recent Labs Lab 04/07/17 1243 04/08/17 0429 04/09/17 0545 04/10/17 0311 04/11/17 0359  INR 2.72 2.95 2.72 2.43 1.68    No results for input(s): DDIMER in the last 72 hours.  Cardiac Enzymes No results for input(s): CKMB, TROPONINI, MYOGLOBIN in the last 168 hours.  Invalid input(s): CK ------------------------------------------------------------------------------------------------------------------    Component Value Date/Time   BNP 199.5 (H) 03/30/2017 1610    Micro Results Recent Results (from the past 240 hour(s))  Blood Culture (routine x 2)     Status: None (Preliminary result)   Collection Time: 04/07/17 12:50 PM  Result Value Ref Range Status   Specimen Description BLOOD RIGHT FOREARM  Final   Special Requests   Final    BOTTLES DRAWN AEROBIC AND ANAEROBIC Blood Culture adequate volume   Culture NO GROWTH 3 DAYS  Final   Report Status PENDING  Incomplete  Urine culture     Status: None   Collection Time: 04/07/17  3:12 PM  Result Value Ref Range Status   Specimen Description URINE, CATHETERIZED  Final   Special Requests NONE  Final   Culture NO GROWTH  Final   Report Status 04/08/2017 FINAL  Final  Blood Culture (routine x 2)     Status: None (Preliminary result)   Collection Time: 04/07/17  3:44 PM  Result Value Ref Range Status   Specimen Description BLOOD RIGHT FOREARM  Final   Special Requests IN PEDIATRIC BOTTLE Blood Culture adequate volume  Final    Culture NO GROWTH 3 DAYS  Final  Report Status PENDING  Incomplete    Radiology Reports Ct Abdomen Pelvis Wo Contrast  Result Date: 04/07/2017 CLINICAL DATA:  Anemia history of aortic aneurysm EXAM: CT ABDOMEN AND PELVIS WITHOUT CONTRAST TECHNIQUE: Multidetector CT imaging of the abdomen and pelvis was performed following the standard protocol without IV contrast. COMPARISON:  01/30/2017 FINDINGS: Lower chest: Patchy bibasilar opacities. No pleural effusion. Cardiomegaly with coronary artery calcification. Calcifications at the ventricular apex may relate to old infarct. Hepatobiliary: No focal hepatic abnormality. Possible small stones or sludge in the gallbladder. No biliary dilatation Pancreas: Unremarkable. No pancreatic ductal dilatation or surrounding inflammatory changes. Spleen: Small splenic tissue in the left upper quadrant with adjacent surgical change or calcification. Adrenals/Urinary Tract: Adrenal glands are within normal limits. Punctate nonobstructing stone in the mid right kidney. No hydronephrosis. The bladder is unremarkable Stomach/Bowel: Stomach is nonenlarged. There is a gastrostomy tube. No dilated small bowel. No colon wall thickening. Vascular/Lymphatic: Atherosclerotic vascular disease. No aneurysmal dilatation. No significantly enlarged lymph nodes. Reproductive: Prostate glands calcifications. Other: Ventral hernia containing mesenteric fat and small amount of bowel. No free air or free fluid Musculoskeletal: Status post left hip replacement with associated artifact. Probable old fracture left inferior pubic ramus. Old right-sided rib fractures. IMPRESSION: 1. Aortic atherosclerotic vascular disease but no aneurysmal dilatation. No evidence for retroperitoneal mass or hematoma. 2. Patchy bibasilar lung opacities, not significantly changed 3. Punctate nonobstructing stone in the mid right kidney 4. Possible small stones or sludge in the gallbladder 5. Ventral hernia containing  mesenteric fat and small amount of bowel but no evidence for an obstruction Electronically Signed   By: Jasmine Pang M.D.   On: 04/07/2017 22:43   Dg Chest 2 View  Result Date: 03/17/2017 CLINICAL DATA:  61 year old with chronic ventilator dependent respiratory failure presenting with cough. Current history of chronic systolic congestive heart failure, hypertension, cardiomyopathy and COPD. EXAM: CHEST  2 VIEW 3:48 p.m.: COMPARISON:  Portable chest x-ray earlier today 1:43 p.m., 03/06/2017 and earlier. FINDINGS: AP erect and lateral images were obtained. Tracheostomy tube tip in satisfactory position below the thoracic inlet projecting approximately 5 cm above carina. Patchy airspace opacities in the lung bases, left greater than right, similar to the examination earlier today, new since 03/06/2017. Slight improvement in the interstitial pulmonary edema since earlier today. No new pulmonary parenchymal abnormalities since earlier today. IMPRESSION: 1. Tracheostomy tube tip in satisfactory position approximately 5 cm above carina. 2. Pneumonia involving the lung bases, left greater than right. 3. Improving interstitial pulmonary edema. Electronically Signed   By: Hulan Saas M.D.   On: 03/17/2017 16:14   Ct Chest Wo Contrast  Result Date: 03/29/2017 CLINICAL DATA:  Shortness of breath, history respiratory failure requiring increased oxygen usage today, tracheostomy dependent, denies chest pain EXAM: CT CHEST WITHOUT CONTRAST TECHNIQUE: Multidetector CT imaging of the chest was performed following the standard protocol without IV contrast. Sagittal and coronal MPR images reconstructed from axial data set. COMPARISON:  12/23/2016 FINDINGS: Cardiovascular: Atherosclerotic calcifications aorta, proximal great vessels, and coronary arteries ascending aorta aneurysmally dilated 4.5 cm transverse image 62. No pericardial effusion. Calcifications at cardiac apex question prior MI. Mediastinum/Nodes: Esophagus  unremarkable. Tracheostomy tube within trachea. Scattered normal sized mediastinal lymph nodes largest 10 mm RIGHT paratracheal node image 47 and 10 mm para-aortic node image 53. Base of cervical region unremarkable. Lungs/Pleura: Scattered peripheral chronic interstitial infiltrates in both lungs. Dependent atelectasis in both lower lobes improved since previous exam. Central peribronchial thickening greatest in lower lobes. Scattered subpleural  blebs at the apices greater on RIGHT. RIGHT upper lobe nodular density 9 mm diameter image 42 unchanged. No pleural effusion or pneumothorax. Upper Abdomen: Gastrostomy tube within collapsed stomach. Post splenectomy with question splenule at splenectomy bed. Visualized upper abdomen otherwise unremarkable. Musculoskeletal: Old appearing compression deformity of an upper thoracic vertebra approximately T6. Bones demineralized. No other focal bony abnormalities. IMPRESSION: Aortic atherosclerosis and coronary arterial calcifications aneurysmal dilatation of the ascending thoracic aorta 4.5 cm transverse, recommendation below. Ascending thoracic aortic aneurysm. Recommend semi-annual imaging followup by CTA or MRA and referral to cardiothoracic surgery if not already obtained. This recommendation follows 2010 ACCF/AHA/AATS/ACR/ASA/SCA/SCAI/SIR/STS/SVM Guidelines for the Diagnosis and Management of Patients With Thoracic Aortic Disease. Circulation. 2010; 121: Z610-R604 Mural calcification at cardiac apex question prior MI. Bronchitic changes with scattered chronic peripheral interstitial infiltrates in both lungs and improved bibasilar atelectasis. No definite acute infiltrate. Stable 9 mm RIGHT upper lobe nodule. Upper normal sized mediastinal lymph nodes without definite thoracic adenopathy. Electronically Signed   By: Ulyses Southward M.D.   On: 03/29/2017 12:45   Dg Chest Port 1 View  Result Date: 04/10/2017 CLINICAL DATA:  Shortness of breath EXAM: PORTABLE CHEST 1 VIEW  COMPARISON:  04/10/2017 FINDINGS: Tracheostomy tube in place. Bilateral interstitial and alveolar airspace opacities. No pleural effusion or pneumothorax. Stable cardiomegaly. No acute osseous abnormality. IMPRESSION: Mild pulmonary edema. Electronically Signed   By: Elige Ko   On: 04/10/2017 11:51   Dg Chest Port 1 View  Result Date: 04/10/2017 CLINICAL DATA:  Short of breath EXAM: PORTABLE CHEST 1 VIEW COMPARISON:  04/08/2017 FINDINGS: Cardiac enlargement. Progression of bilateral airspace disease most likely due to pulmonary edema. Bibasilar airspace disease with some progression on the right. IMPRESSION: Mild progression of bilateral airspace disease.  Probable edema. Progression of right lower lobe airspace disease. Possible pneumonia Electronically Signed   By: Marlan Palau M.D.   On: 04/10/2017 07:30   Dg Chest Port 1 View  Result Date: 04/08/2017 CLINICAL DATA:  Tracheostomy tube malfunctioning. Initial encounter. EXAM: PORTABLE CHEST 1 VIEW COMPARISON:  Chest radiograph performed earlier today at 6:44 a.m. FINDINGS: The patient's tracheostomy tube is seen ending 7-8 cm above the carina. It is grossly stable in appearance. Patchy bibasilar airspace opacities raise concern for multifocal pneumonia, redistributed from the prior study, though interstitial edema might have a similar appearance. Underlying vascular congestion is noted. No pleural effusion or pneumothorax is seen, though the left costophrenic angle is incompletely imaged on this study. The cardiomediastinal silhouette is borderline enlarged. No acute osseous abnormalities are seen. There is chronic deformity of the right mid clavicle. IMPRESSION: 1. Tracheostomy tube seen ending 7-8 cm above the carina. It is grossly stable in appearance. 2. Patchy bibasilar airspace opacities raise concern for multifocal pneumonia, redistributed from the recent prior study, though interstitial edema might have a similar appearance. 3. Underlying  vascular congestion and borderline cardiomegaly. Electronically Signed   By: [name] M.D.   On: 04/08/2017 21:37   Dg Chest Port 1 View  Result Date: 04/08/2017 CLINICAL DATA:  Sepsis EXAM: PORTABLE CHEST 1 VIEW COMPARISON:  04/07/2017 FINDINGS: Left lower lobe airspace disease unchanged. Mild right upper lobe airspace disease has progressed. Negative for effusion. Tracheostomy remains in good position. IMPRESSION: Left lower lobe airspace disease unchanged. Progression of mild right upper lobe airspace disease. Possible pneumonia. Electronically Signed   By: Marlan Palau M.D.   On: 04/08/2017 09:34   Dg Chest Port 1 View  Result Date: 04/07/2017 CLINICAL DATA:  Short of breath fever EXAM: PORTABLE CHEST 1 VIEW COMPARISON:  03/27/2017 FINDINGS: Tracheostomy remains in good position. Bibasilar airspace disease is mildly improved. Negative for heart failure or effusion. IMPRESSION: Partial clearing of bibasilar atelectasis/infiltrate. Electronically Signed   By: Marlan Palau M.D.   On: 04/07/2017 13:01   Dg Chest Port 1 View  Result Date: 03/27/2017 CLINICAL DATA:  Shortness of Breath EXAM: PORTABLE CHEST 1 VIEW COMPARISON:  March 24, 2017 FINDINGS: Tracheostomy catheter tip is 4.5 cm above the carina. No pneumothorax. There is patchy airspace consolidation in the left base. There is mild bibasilar atelectasis as well. Heart is borderline enlarged with pulmonary vascularity within normal limits. No adenopathy. No bone lesions. IMPRESSION: Tracheostomy as described without pneumothorax. Patchy airspace opacity left base, suspect a degree of pneumonia. Patchy atelectasis in the bases as well. Stable cardiac silhouette. Electronically Signed   By: Bretta Bang III M.D.   On: 03/27/2017 09:32   Dg Chest Port 1 View  Result Date: 03/24/2017 CLINICAL DATA:  Hypoxia EXAM: PORTABLE CHEST 1 VIEW COMPARISON:  Two days ago FINDINGS: Tracheostomy tube in place. Cardiomegaly and diffuse interstitial  opacity. Low lung volumes. Small layering pleural effusions are not excluded. No evidence of pneumothorax. IMPRESSION: 1. Cardiomegaly and mild pulmonary edema. 2. Low volume chest with atelectasis. 3. No change from study 2 days prior. Electronically Signed   By: Marnee Spring M.D.   On: 03/24/2017 14:47   Dg Chest Port 1 View  Result Date: 03/22/2017 CLINICAL DATA:  Hypoxia EXAM: PORTABLE CHEST 1 VIEW COMPARISON:  03/19/2017; 03/17/2017; 03/06/2017 FINDINGS: Grossly unchanged enlarged cardiac silhouette and mediastinal contours given AP projection. Stable position of support apparatus. Pulmonary vasculature remains indistinct with cephalization of flow. Minimally improved aeration of lungs with persistent bibasilar opacities, left greater than right. No new focal airspace opacities. No definite pleural effusion or pneumothorax. No acute osseus abnormalities. IMPRESSION: 1.  Stable positioning of support apparatus.  No pneumothorax. 2. Minimally improved bibasilar atelectasis with otherwise similar findings of mild pulmonary edema. Electronically Signed   By: [name] M.D.   On: 03/22/2017 09:15   Dg Chest Port 1 View  Result Date: 03/19/2017 CLINICAL DATA:  Shortness of breath EXAM: PORTABLE CHEST 1 VIEW COMPARISON:  03/17/2017; 03/06/2017; 01/27/2017 FINDINGS: Unchanged enlarged cardiac silhouette and mediastinal contours. Stable position of support apparatus. Pulmonary vasculature appears less distinct with cephalization of flow. Interval development of trace right and small left-sided effusion with associated worsening bibasilar opacities, left greater than right. No pneumothorax. No acute osseus abnormalities. Old right anterior rib fractures. IMPRESSION: 1.  No acute cardiopulmonary disease. 2. Suspected worsening pulmonary edema and bibasilar atelectasis, left greater than right. Electronically Signed   By: [name] M.D.   On: 03/19/2017 13:40   Dg Chest Port 1 View  Result Date:  03/17/2017 CLINICAL DATA:  Shortness of breath EXAM: PORTABLE CHEST 1 VIEW COMPARISON:  03/06/2017, 02/20/2017, 02/08/2017 FINDINGS: Tracheostomy tube tip about 2.4 cm superior to the carina and projects over the tracheal air column. Cardiomegaly with central vascular congestion and mild interstitial edema. Streaky opacity at the left base may reflect atelectasis or an infiltrate. Old right clavicular fracture and old rib fractures. IMPRESSION: Tracheostomy tube tip projects over tracheal air column and tip is about 2.4 cm superior to carina Cardiomegaly with central vascular congestion and mild diffuse edema. Streaky atelectasis or pneumonia at the left base. Electronically Signed   By: Jasmine Pang M.D.   On: 03/17/2017 14:02  Time Spent in minutes  30   Susa Raring M.D on 04/11/2017 at 8:57 AM  Between 7am to 7pm - Pager - [phone] ( page via [email or web address], text pages only, please mention full 10 digit call back number). After 7pm go to [email or web address] password [email or web address]

## 2017-04-11 NOTE — Progress Notes (Signed)
SLP Cancellation Note  Patient Details Name: Curtis Keller MRN: [id number] DOB: 11-30-1955   Cancelled treatment:       Reason Eval/Treat Not Completed: Pt requires RN care. Per MD note, he does not want to proceed with PO intake at this time due to risk of aspiration. Discussed MBS results at length with pts sister. Will follow for pharyngeal exercises as pt is able to participate.   Margaretha Mahan, Riley Nearing 04/11/2017, 11:38 AM

## 2017-04-11 NOTE — NC FL2 (Signed)
[place] MEDICAID FL2 LEVEL OF CARE SCREENING TOOL     IDENTIFICATION  Patient Name: Curtis Keller Birthdate: [birth year] Sex: male Admission Date (Current Location): 04/07/2017  [facility] and [place] Number:  Producer, television/film/video and Address:  [facility] [place], [address] [place], [place] [address]      Provider Number: [id number]  Attending Physician Name and Address:  Leroy Sea, MD  Relative Name and Phone Number:       Current Level of Care: Hospital Recommended Level of Care: Skilled Nursing Facility Prior Approval Number:    Date Approved/Denied:   PASRR Number: [id number]  Discharge Plan: SNF    Current Diagnoses: Patient Active Problem List   Diagnosis Date Noted  . Sepsis (HCC) 04/07/2017  . Acute on chronic respiratory failure with hypoxia (HCC)   . Tracheostomy status (HCC)   . SOB (shortness of breath)   . Hypoxia   . Pressure injury of skin 03/20/2017  . HCAP (healthcare-associated pneumonia) 03/18/2017  . Pneumonia 03/17/2017  . 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 Depression with anxiety 01/23/2017  . Encounter for hospice care discussion   . DNR (do not resuscitate)   . Ischemic cardiomyopathy   . Goals of care, counseling/discussion   . Palliative care encounter   . Acute systolic congestive heart failure (HCC)   . Acute and chronic respiratory failure with hypoxia (HCC) 11/28/2016  . Hip fracture (HCC) 11/27/2016  . Closed nondisplaced intertrochanteric fracture of left femur (HCC) 11/27/2016  . Chronic respiratory failure (HCC) 11/27/2016  . Fall at nursing home 07/17/2016  . Diabetes mellitus, type II, insulin dependent (HCC) 06/12/2016  . GERD (gastroesophageal reflux disease) 05/07/2016  . COPD (chronic obstructive pulmonary disease) (HCC) 04/18/2016  . Chronic anticoagulation 03/22/2016  . S/P splenectomy 03/13/2016  . PEG (percutaneous endoscopic gastrostomy) status (HCC)  03/13/2016  . MRSA (methicillin resistant Staphylococcus aureus) septicemia (HCC) 03/13/2016  . Aspiration pneumonia (HCC) 03/13/2016  . Dysphagia 03/13/2016  . Acute blood loss anemia 03/13/2016  . Hyperlipidemia 03/13/2016  . Heart failure, chronic systolic (HCC)   . Hypertension   . Depression   . Status post tracheostomy (HCC)   . Cardiomyopathy (HCC)   . Apical mural thrombus   . Protein calorie malnutrition (HCC)   . Intraparenchymal hematoma of brain due to trauma [facility])     Orientation RESPIRATION BLADDER Height & Weight     Self, Place  Tracheostomy, O2 (4mm shiley uncuffed at 28%) Incontinent, External catheter Weight: 205 lb 7.5 oz (93.2 kg) Height:  6\' 2"  (188 cm)  BEHAVIORAL SYMPTOMS/MOOD NEUROLOGICAL BOWEL NUTRITION STATUS      Incontinent (gastrostomy) Feeding tube  AMBULATORY STATUS COMMUNICATION OF NEEDS Skin   Extensive Assist Non-Verbally PU Stage and Appropriate Care                       Personal Care Assistance Level of Assistance  Bathing, Dressing Bathing Assistance: Maximum assistance   Dressing Assistance: Maximum assistance     Functional Limitations Info             SPECIAL CARE FACTORS FREQUENCY                       Contractures      Additional Factors Info  Code Status, Allergies, Suctioning Needs, Insulin Sliding Scale Code Status Info: FULL Allergies Info: Codeine, Lisinopril, Hydrocodone, Hydrocodone-acetaminophen, Tegaderm Ag Mesh Silver,  Tylenol Acetaminophen   Insulin Sliding Scale Info: 6/day Isolation Precautions Info: MRSA     Current Medications (04/11/2017):  This is the current hospital active medication list Current Facility-Administered Medications  Medication Dose Route Frequency Provider Last Rate Last Dose  . acetylcysteine (MUCOMYST) 20 % nebulizer / oral solution 4 mL  4 mL Nebulization BID Leroy Sea, MD   4 mL at 04/11/17 0737  . albuterol (PROVENTIL) (2.5 MG/3ML) 0.083% nebulizer solution  2.5 mg  2.5 mg Nebulization Q4H PRN Leroy Sea, MD   2.5 mg at 04/08/17 1145  . amoxicillin-clavulanate (AUGMENTIN) 400-57 MG/5ML suspension 800 mg  800 mg Per Tube Q12H Leroy Sea, MD   800 mg at 04/11/17 1026  . atorvastatin (LIPITOR) tablet 40 mg  40 mg Per Tube QHS Marcos Eke, PA-C   40 mg at 04/10/17 2157  . bisacodyl (DULCOLAX) suppository 10 mg  10 mg Rectal Daily PRN Marcos Eke, PA-C      . chlorhexidine (PERIDEX) 0.12 % solution 15 mL  15 mL Mouth Rinse BID Katrinka Blazing, Rondell A, MD   15 mL at 04/11/17 1024  . famotidine (PEPCID) tablet 20 mg  20 mg Per Tube Q24H Marcos Eke, PA-C   20 mg at 04/10/17 1845  . feeding supplement (OSMOLITE 1.5 CAL) liquid 1,000 mL  1,000 mL Per Tube Continuous Rumbarger, Faye Ramsay, RPH 62 mL/hr at 04/10/17 1847 1,000 mL at 04/10/17 1847  . feeding supplement (PRO-STAT SUGAR FREE 64) liquid 60 mL  60 mL Per Tube BID Marcos Eke, PA-C   60 mL at 04/11/17 1021  . folic acid (FOLVITE) tablet 1 mg  1 mg Per Tube Daily Marcos Eke, PA-C   1 mg at 04/11/17 1227  . free water 150 mL  150 mL Per Tube Q4H Wertman, Sara E, PA-C   150 mL at 04/11/17 1200  . guaiFENesin tablet 200 mg  200 mg Per Tube TID Marlowe Kays E, PA-C   200 mg at 04/11/17 1000  . haloperidol lactate (HALDOL) injection 2 mg  2 mg Intravenous Q6H PRN Leroy Sea, MD      . insulin aspart (novoLOG) injection 0-9 Units  0-9 Units Subcutaneous Q4H Madelyn Flavors A, MD   1 Units at 04/11/17 1227  . ipratropium-albuterol (DUONEB) 0.5-2.5 (3) MG/3ML nebulizer solution 3 mL  3 mL Nebulization QID Leroy Sea, MD   3 mL at 04/11/17 1119  . MEDLINE mouth rinse  15 mL Mouth Rinse q12n4p Smith, Rondell A, MD   15 mL at 04/11/17 1228  . metoprolol tartrate (LOPRESSOR) tablet 25 mg  25 mg Per Tube BID Marcos Eke, PA-C   25 mg at 04/11/17 1023  . multivitamin with minerals tablet 1 tablet  1 tablet Per Tube Daily Marcos Eke, PA-C   1 tablet at 04/11/17 1023  .  OLANZapine (ZYPREXA) tablet 5 mg  5 mg Per Tube BID Marcos Eke, PA-C   5 mg at 04/11/17 1000  . ondansetron (ZOFRAN) injection 4 mg  4 mg Intravenous Q6H PRN Marlowe Kays E, PA-C      . polyethylene glycol (MIRALAX / GLYCOLAX) packet 17 g  17 g Per Tube Daily PRN Gwynneth Munson, Sung Amabile, PA-C      . senna-docusate (Senokot-S) tablet 1 tablet  1 tablet Oral QHS PRN Marcos Eke, PA-C   1 tablet at 04/07/17 2109  . vitamin B-12 (CYANOCOBALAMIN) tablet 1,000 mcg  1,000 mcg  Per Tube Daily Marcos Eke, PA-C   1,000 mcg at 04/11/17 1026  . warfarin (COUMADIN) tablet 6 mg  6 mg Per Tube Daily Armandina Stammer, RPH      . Warfarin - Physician Dosing Inpatient   Does not apply q1800 Rumbarger, Faye Ramsay [facility]         Discharge Medications: Please see discharge summary for a list of discharge medications.  Relevant Imaging Results:  Relevant Lab Results:   Additional Information SS#: [id number]  Burna Sis, LCSW

## 2017-04-12 DIAGNOSIS — J9621 Acute and chronic respiratory failure with hypoxia: Secondary | ICD-10-CM

## 2017-04-12 DIAGNOSIS — J690 Pneumonitis due to inhalation of food and vomit: Secondary | ICD-10-CM

## 2017-04-12 DIAGNOSIS — I429 Cardiomyopathy, unspecified: Secondary | ICD-10-CM | POA: Diagnosis not present

## 2017-04-12 DIAGNOSIS — D62 Acute posthemorrhagic anemia: Secondary | ICD-10-CM | POA: Diagnosis not present

## 2017-04-12 DIAGNOSIS — I513 Intracardiac thrombosis, not elsewhere classified: Secondary | ICD-10-CM | POA: Diagnosis not present

## 2017-04-12 LAB — CBC
HCT: 39.4 % (ref 39.0–52.0)
Hemoglobin: 12.3 g/dL — ABNORMAL LOW (ref 13.0–17.0)
MCH: 30.5 pg (ref 26.0–34.0)
MCHC: 31.2 g/dL (ref 30.0–36.0)
MCV: 97.8 fL (ref 78.0–100.0)
PLATELETS: 199 10*3/uL (ref 150–400)
RBC: 4.03 MIL/uL — AB (ref 4.22–5.81)
RDW: 15.1 % (ref 11.5–15.5)
WBC: 11.7 10*3/uL — ABNORMAL HIGH (ref 4.0–10.5)

## 2017-04-12 LAB — BASIC METABOLIC PANEL
Anion gap: 9 (ref 5–15)
BUN: 55 mg/dL — ABNORMAL HIGH (ref 6–20)
CO2: 22 mmol/L (ref 22–32)
Calcium: 9.4 mg/dL (ref 8.9–10.3)
Chloride: 107 mmol/L (ref 101–111)
Creatinine, Ser: 1.68 mg/dL — ABNORMAL HIGH (ref 0.61–1.24)
GFR calc Af Amer: 49 mL/min — ABNORMAL LOW (ref >60)
GFR, EST NON AFRICAN AMERICAN: 42 mL/min — AB (ref >60)
GLUCOSE: 134 mg/dL — AB (ref 65–99)
POTASSIUM: 4.4 mmol/L (ref 3.5–5.1)
SODIUM: 138 mmol/L (ref 135–145)

## 2017-04-12 LAB — CULTURE, BLOOD (ROUTINE X 2)
Culture: NO GROWTH
Culture: NO GROWTH
SPECIAL REQUESTS: ADEQUATE
Special Requests: ADEQUATE

## 2017-04-12 LAB — GLUCOSE, CAPILLARY
GLUCOSE-CAPILLARY: 130 mg/dL — AB (ref 65–99)
GLUCOSE-CAPILLARY: 150 mg/dL — AB (ref 65–99)
GLUCOSE-CAPILLARY: 151 mg/dL — AB (ref 65–99)
Glucose-Capillary: 139 mg/dL — ABNORMAL HIGH (ref 65–99)
Glucose-Capillary: 139 mg/dL — ABNORMAL HIGH (ref 65–99)
Glucose-Capillary: 142 mg/dL — ABNORMAL HIGH (ref 65–99)

## 2017-04-12 LAB — PROTIME-INR
INR: 1.4
PROTHROMBIN TIME: 17.3 s — AB (ref 11.4–15.2)

## 2017-04-12 MED ORDER — WARFARIN SODIUM 6 MG PO TABS
6.0000 mg | ORAL_TABLET | Freq: Every day | ORAL | Status: DC
  Filled 2017-04-12: qty 1

## 2017-04-12 MED ORDER — WARFARIN SODIUM 7.5 MG PO TABS
7.5000 mg | ORAL_TABLET | Freq: Once | ORAL | Status: AC
  Administered 2017-04-12: 7.5 mg via ORAL
  Filled 2017-04-12: qty 1

## 2017-04-12 MED ORDER — ENOXAPARIN SODIUM 100 MG/ML ~~LOC~~ SOLN
100.0000 mg | Freq: Two times a day (BID) | SUBCUTANEOUS | Status: DC
  Administered 2017-04-12 – 2017-04-13 (×2): 100 mg via SUBCUTANEOUS
  Filled 2017-04-12 (×3): qty 1

## 2017-04-12 NOTE — Progress Notes (Signed)
Physical Therapy Treatment Patient Details Name: Curtis Keller MRN: [id number] DOB: 07-26-1956 Today's Date: 04/12/2017    History of Present Illness Patient is a 61 y/o male who presents from [facility] with SOB, fever. Found to have sepsis and possibly PNA. PMH includes respiratory failure and trach dependent rquiring 02, dysphagia with PEG, COPD, CVA, contractures LUE/LE, CHF, falls, hip fx, LV apical thrombus, recently admitted 5/25 with hypoxia and PNA.     PT Comments    Pt presented supine in bed with HOB elevated, awake and willing to participate in therapy session. Pt with reports of pain in L LE during mobility and with sitting EOB. During bed mobility pt's condom cath was dislodged and pt perseverating on this towards end of session. Pt's nurse tech was notified. Pt would continue to benefit from skilled physical therapy services at this time while admitted and after d/c to address the below listed limitations in order to improve overall safety and independence with functional mobility.    Follow Up Recommendations  SNF;Supervision/Assistance - 24 hour     Equipment Recommendations  None recommended by PT    Recommendations for Other Services       Precautions / Restrictions Precautions Precautions: Fall Precaution Comments: L hip hemi 02/10/17 from injury 2/18 Restrictions Weight Bearing Restrictions: No    Mobility  Bed Mobility Overal bed mobility: Needs Assistance Bed Mobility: Sit to Supine;Supine to Sit     Supine to sit: Max assist;+2 for physical assistance Sit to supine: +2 for physical assistance;Max assist   General bed mobility comments: assist at bilateral LEs and to elevate trunk to achieve sitting EOB. max A x2 to return to supine  Transfers                 General transfer comment: deferred at this time secondary to fatigue and increased pain in L LE  Ambulation/Gait                 Stairs            Wheelchair  Mobility    Modified Rankin (Stroke Patients Only) Modified Rankin (Stroke Patients Only) Pre-Morbid Rankin Score: Severe disability Modified Rankin: Severe disability     Balance Overall balance assessment: Needs assistance Sitting-balance support: Feet supported;Single extremity supported Sitting balance-Leahy Scale: Poor Sitting balance - Comments: progressing from max A to close min guard for brief periods of time Postural control: Left lateral lean;Right lateral lean;Posterior lean                                  Cognition Arousal/Alertness: Awake/alert Behavior During Therapy: Flat affect Overall Cognitive Status: No family/caregiver present to determine baseline cognitive functioning Area of Impairment: Attention;Following commands;Safety/judgement;Awareness;Problem solving                   Current Attention Level: Selective   Following Commands: Follows one step commands with increased time Safety/Judgement: Decreased awareness of safety;Decreased awareness of deficits Awareness: Intellectual Problem Solving: Slow processing;Decreased initiation;Difficulty sequencing;Requires verbal cues;Requires tactile cues        Exercises Other Exercises Other Exercises: seated lateral weight shifting activity with readjustment to midline    General Comments        Pertinent Vitals/Pain Pain Assessment: Faces Faces Pain Scale: Hurts little more Pain Location: L LE Pain Descriptors / Indicators: Grimacing Pain Intervention(s): Monitored during session;Repositioned    Home Living  Prior Function            PT Goals (current goals can now be found in the care plan section) Acute Rehab PT Goals PT Goal Formulation: With patient Time For Goal Achievement: 04/22/17 Potential to Achieve Goals: Fair Progress towards PT goals: Progressing toward goals    Frequency    Min 2X/week      PT Plan Current plan  remains appropriate    Co-evaluation              AM-PAC PT "6 Clicks" Daily Activity  Outcome Measure  Difficulty turning over in bed (including adjusting bedclothes, sheets and blankets)?: Total Difficulty moving from lying on back to sitting on the side of the bed? : Total Difficulty sitting down on and standing up from a chair with arms (e.g., wheelchair, bedside commode, etc,.)?: Total Help needed moving to and from a bed to chair (including a wheelchair)?: Total Help needed walking in hospital room?: Total Help needed climbing 3-5 steps with a railing? : Total 6 Click Score: 6    End of Session Equipment Utilized During Treatment: Oxygen (trach collar) Activity Tolerance: Patient limited by pain Patient left: in bed;with call bell/phone within reach;with bed alarm set Nurse Communication: Mobility status;Need for lift equipment;Other (comment) (condom cath dislodged) PT Visit Diagnosis: Pain;Unsteadiness on feet (R26.81);Other abnormalities of gait and mobility (R26.89)     Time: 1610-9604 PT Time Calculation (min) (ACUTE ONLY): 15 min  Charges:  $Therapeutic Activity: 8-22 mins                    G Codes:       [name], [name], [name] [phone]    [name] 04/12/2017, 2:04 PM

## 2017-04-12 NOTE — Progress Notes (Addendum)
ANTICOAGULATION CONSULT NOTE - Initial Consult  Pharmacy Consult for coumadin/lovenox Indication: CVA/ Hx of apical thrombus  Allergies  Allergen Reactions  . Codeine Anaphylaxis and Nausea And Vomiting  . Lisinopril Itching and Rash  . Hydrocodone Other (See Comments)    On MAR  . Hydrocodone-Acetaminophen Nausea And Vomiting    Cannot take any acetaminophen due to liver issues per sister  . Tegaderm Ag Mesh [Silver] Other (See Comments)    Allergy to "silver compounds" listed on MAR  . Tylenol [Acetaminophen] Nausea Only and Other (See Comments)    Cannot take any acetaminophen due to liver issues per sister   Patient Measurements: Height: 6\' 2"  (188 cm) Weight: 224 lb 10.4 oz (101.9 kg) IBW/kg (Calculated) : 82.2  Vital Signs: Temp: 98.3 F (36.8 C) (06/20 1402) Temp Source: Oral (06/20 1402) BP: 105/76 (06/20 1402) Pulse Rate: 79 (06/20 1402)  Labs:  Recent Labs  04/10/17 0311 04/11/17 0359 04/12/17 0525  HGB 12.1* 11.9* 12.3*  HCT 38.6* 38.6* 39.4  PLT 206 218 199  LABPROT 26.9* 20.0* 17.3*  INR 2.43 1.68 1.40  CREATININE  --  1.73* 1.68*   Estimated Creatinine Clearance: 58.1 mL/min (A) (by C-G formula based on SCr of 1.68 mg/dL (H)).  Medical History: Past Medical History:  Diagnosis Date  . Acute respiratory failure (HCC)    a. Spring 2017 following fall/splenectomy -->admitted to [facility]-->trach/g tube.  . Anemia    a. 12/2015 ABL in setting of fall/hematomas/splenic laceration req splenectomy.  Marland Kitchen Anxiety   . Apical mural thrombus    a. 11/2015 Echo: EF 25-30%, moderate size apical thrombus-->coumadin;  b. 12/2015 f/u Echo: EF 25-30%, ant/septa HK, no obvious large thrombus but cannot exclude small mural thrombus.  Marland Kitchen CAD (coronary artery disease)    a. 12/2014 s/p BMS to the RCA.  Marland Kitchen Cerebral infarction (HCC)   . Chronic bronchitis (HCC)    "probably"/sister 04/07/2017  . Chronic systolic CHF (congestive heart failure) (HCC)    a. 12/2015  Echo: EF 25-30%.  . Dementia   . Depression   . Dysphagia   . Falls    a. 11/2015 traumatic fall with resultant trauma req splenectomy and prolonged hospitalization complicated by resp failure.  . Hepatitis C    "never treated" (04/07/2017)  . History of blood transfusion 12/2015; 04/07/2017   "related to spleen OR; related to low HgB"   . History of gout   . Hyperlipidemia   . Hypertension   . Ischemic cardiomyopathy    a. 12/2015 Echo: EF 25-30%, anterior and septal HK.  Marland Kitchen Left hemiplegia (HCC)   . Myocardial infarction (HCC) 1999; ?2010  . On home oxygen therapy    "28% at the SNF" (6/15/2018Z)  . Pneumonia    "several times since 12/19/2015" (6/15/20180)  . Protein calorie malnutrition (HCC)   . Respiratory failure (HCC)   . Spleen injury   . Status post tracheostomy (HCC)    a. 01/2016 in setting of ongoing resp failure and aspiration.  . Stroke (HCC) 12/19/2015   "left side paralyzed since" (04/07/2017)  . Type II diabetes mellitus (HCC)    Medications:  Prescriptions Prior to Admission  Medication Sig Dispense Refill Last Dose  . Amino Acids-Protein Hydrolys (FEEDING SUPPLEMENT, PRO-STAT SUGAR FREE 64,) LIQD Place 60 mLs into feeding tube 2 (two) times daily.    04/06/2017 at 1800  . atorvastatin (LIPITOR) 40 MG tablet Place 40 mg into feeding tube at bedtime. for Lipids   04/06/2017  at 2100  . famotidine (PEPCID) 20 MG tablet Place 20 mg into feeding tube 2 (two) times daily. for GERD   04/06/2017 at 1800  . folic acid (FOLVITE) 1 MG tablet Place 1 mg into feeding tube daily.    04/06/2017 at 900  . guaiFENesin 200 MG tablet Place 200 mg into feeding tube 3 (three) times daily. for CHF   04/06/2017 at 1800  . lidocaine (LIDODERM) 5 % Place 1 patch onto the skin daily. Remove & Discard patch within 12 hours or as directed by MD (Patient taking differently: Place 2 patches onto the skin See admin instructions. Apply 2 patches to left knee every morning -remove & discard patch within 12  hours or as directed by MD) 5 patch 0 04/06/2017 at 900  . metoprolol tartrate (LOPRESSOR) 25 MG tablet Place 25 mg into feeding tube 2 (two) times daily.   04/06/2017 at 1800  . Multiple Vitamin (MULTIVITAMIN WITH MINERALS) TABS tablet Place 1 tablet into feeding tube daily.   04/06/2017 at 800  . Nutritional Supplements (FEEDING SUPPLEMENT, OSMOLITE 1.5 CAL,) LIQD Place into feeding tube See admin instructions. 80 cc/hr 4p to 10a per order dated 04/06/17   04/05/2017  . OLANZapine (ZYPREXA) 5 MG tablet Place 5 mg into feeding tube 2 (two) times daily.    04/06/2017 at 1800  . Omega-3 Fatty Acids (FISH OIL) 1000 MG CAPS Place 1,000 mg into feeding tube daily.   04/06/2017 at 800  . oxyCODONE-acetaminophen (PERCOCET/ROXICET) 5-325 MG tablet Give 1 tablet via G-tube every 8 hours for pain (Patient taking differently: Place 1 tablet into feeding tube every 8 (eight) hours. for pain) 90 tablet 0 04/07/2017 at 600  . OXYGEN See admin instructions. Oxygen at 28% via trach cuff every shift   04/06/2017 at Unknown time  . polyethylene glycol (MIRALAX / GLYCOLAX) packet Place 17 g into feeding tube daily. for constipation   04/06/2017 at 900  . senna (SENOKOT) 8.6 MG TABS tablet Place 1 tablet into feeding tube 2 (two) times daily. For constipation   04/06/2017 at 1800  . tamsulosin (FLOMAX) 0.4 MG CAPS capsule 0.4 mg daily at 6 PM. Give 1 capsule (0.4 mg) via PEG-Tube one time daily   04/06/2017 at 1800  . tiZANidine (ZANAFLEX) 2 MG tablet Take 2 mg by mouth 2 (two) times daily.   04/06/2017 at 1800  . vitamin B-12 (CYANOCOBALAMIN) 1000 MCG tablet Take 1,000 mcg by mouth daily.   04/06/2017 at 900  . warfarin (COUMADIN) 6 MG tablet Place 6 mg into feeding tube daily at 6 PM.    04/06/2017 at 1800  . Water For Irrigation, Sterile (FREE WATER) SOLN Place 150 mLs into feeding tube every 4 (four) hours. 1000 mL 1 04/07/2017 at 600   Assessment: Curtis Keller a 61 y.o.malewith history of LV apical thrombosis initially  on Eliquis thenswitched towarfarin due to development of CVA. Warfarin was resumed yesterday after being held for 2 days prior. INR today is down 1.68> 1.4. Previously therapeutic on warfarin 6mg  daily. CBC stable, no bleeding noted. Bridge with lovenox until INR therapeutic  Goal of Therapy:  INR 2-3 Monitor platelets by anticoagulation protocol: Yes   Plan:  Warfarin 7.5mg  daily Lovenox 100mg  BID Daily INR and CBC Monitor for s/sx of bleeding  Curtis Keller 04/12/2017,4:01 PM

## 2017-04-12 NOTE — Progress Notes (Signed)
  Speech Language Pathology Treatment: Dysphagia;Passy Muir Speaking valve  Patient Details Name: Curtis Keller MRN: [id number] DOB: [birth year] Today's Date: 04/12/2017 Time: 0086-7619 SLP Time Calculation (min) (ACUTE ONLY): 23 min  Assessment / Plan / Recommendation Clinical Impression  SLP followed up for PMSV therapy and swallow therapy. Pt continues on trach collar with FIO2 of 30 percent and 7 liters via trach collar. PMSV placed following suction by RN. No secretions noted at trach site despite deep open suctioning. PMSV utilized for 20 minute duration. Pt required cues for verbal communicatory interactions and defaulted to nonverbal communication. Phonation with mildly reduced vocal intensity that improved with cues for increased breath support. Pharyngeal and laryngeal strength exercises initiated as well as base of tongue strength exercises. Pt did require increased cueing for implementation of exercises. PMSV removed and pt left stable at bedside. ST to continue to follow up.     HPI HPI: Pt is a 61 year old male with a history of respiratory failure, chronic illness, and dysphagia. He has transitioned repeatedly through acute care, [facility] and SNF settings over the past year. He has a PEG tube but when he is in his best condition ahs toelrated thickened liquids and soft solids as well as thin liquids in between meals. He is often impulsive and has had aspiration events with solid foods due to lack of dentition and poor safety awarenes. Pt has multiple prior MBS with the last on [facility] though those records are not available. Pt was recently admitted and seen by SLP 03/30/17, who noted pt had been strictly NPO for at least a month, recommended pt remain NPO with f/u with SLP at SNF, gradual return to PO trials and eventual objective testing. Pt readmitted 04/07/17 with recurrent aspiration pneuomia; given pt's continued NPO status it is unlikely current diagnosis of aspiration pna related to PO  intake. CXR 04/08/17 findings of left lower lobe airspace disease unchanged, progression of mild right upper lobe airspace disease. Possible pneumonia.      SLP Plan  Continue with current plan of care       Recommendations  Diet recommendations: NPO      Patient may use Passy-Muir Speech Valve: Intermittently with supervision;During all therapies with supervision;During PO intake/meals PMSV Supervision: Full         Oral Care Recommendations: Oral care QID Follow up Recommendations: Skilled Nursing facility SLP Visit Diagnosis: Aphonia (R49.1) Plan: Continue with current plan of care       GO               Marcene Duos MA, CCC-SLP Acute Care Speech Language Pathologist    Kennieth Rad 04/12/2017, 4:14 PM

## 2017-04-12 NOTE — Progress Notes (Signed)
CM spoke to to [name] with [facility] this am. She had spoken with Massie Bougie (patients sister and POA) last night and had gone over the requirements for taking patient home with [facility] and trach. Massie Bougie is not able to provide 2 caregivers that can be trained for the trach. She also currently is tight with space in her home for the hospital bed and equipment the patient will need.  CM spoke to [place] this afternoon and she states she is not able to take him home. She very much would like to but just does not have the resources or space needed. She would like a different SNF if possible but understands that [facility] may be the only possibility. CSW updated.

## 2017-04-12 NOTE — Progress Notes (Signed)
[facility]                                                                              Patient Demographics  Curtis Keller, is a 61 y.o. male, DOB - [birth year], XBJ:[id number]  Admit date - 04/07/2017   Admitting Physician Clydie Braun, MD  Outpatient Primary MD for the patient is [facility]  Outpatient specialists:   LOS - 5  days   Medical records reviewed and are as summarized below:    Chief Complaint  Patient presents with  . Fever  . Shortness of Breath       Brief summary   Brief Narrative   Curtis Keller a 61 y.o.malewith extensive medical history listed below, including tracheostomy dependent, on oxygen via trach, dysphagia, status post PEG tubefeeding, COPD, CVA in February 2017 with residual L hemiparesis/contracturs, chronic systolic CHF, ICM with EF 25-30 %, CKD 3, chronic anemia, dementia, recurrent falls, recent heat fracture surgery, a history of LV apical thrombosis initially on Eliquis thenswitched towarfarin due to development of CVA , history od AAA without rupture as of last discharge, Hep C andrecent admission for aspiration pneumonia with hypoxia discharged on 03/30/2017 to SNF, was readmitted on 04/07/2017 for fever and shortness of breath due to pneumonia.   Assessment & Plan   Principal problem Recurrent pneumonia, acute on chronic hypoxic respiratory failure - Continue empiric IV antibiotics - Continue trach collar, nebulizer treatment, trach in position, critical care has evaluated the patient - Overall poor quality of life, planning for SNF although patient's sister has wished to take him home from the hospital  Active problems Chronic systolic CHF, EF 30%, history of LV apical thrombus - EF 25%, continue beta blocker via PEG tube - Continue Coumadin per pharmacy, INR subtherapeutic, 1.4 - Lovenox bridge until INR therapeutic   CVA - Dense left-sided hemiparesis, dysphagia, continue  Coumadin, statin  Dyslipidemia - Continue statin via PEG tube  Hypertension - Continue beta blocker  Anemia of chronic disease - H&H currently stable, continue to monitor   Mild acute renal failure on chronic kidney disease stage IV - Baseline creatinine around 2.4, currently stable  Dementia - Continue Zyprexa  Dysphagia - Continue PEG tube, tube feedings via PEG tube   NSVT - EF less than 50%, continue beta blocker  COPD - No wheezing, continue supportive care  Code Status: Full code DVT Prophylaxis:  Lovenox + Coumadin Family Communication: No family member at the bedside   Disposition Plan:    Time Spent in minutes 25 minutes  Procedures:    Consultants:   Critical care  Antimicrobials:      Medications  Scheduled Meds: . acetylcysteine  4 mL Nebulization BID  . amoxicillin-clavulanate  800 mg Per Tube Q12H  . atorvastatin  40 mg Per Tube QHS  . chlorhexidine  15 mL Mouth Rinse BID  . famotidine  20 mg Per Tube Q24H  . feeding supplement (PRO-STAT SUGAR FREE 64)  60 mL Per Tube BID  . folic acid  1 mg Per Tube Daily  . free water  150 mL Per Tube Q4H  .  guaiFENesin  200 mg Per Tube TID  . insulin aspart  0-9 Units Subcutaneous Q4H  . ipratropium-albuterol  3 mL Nebulization QID  . mouth rinse  15 mL Mouth Rinse q12n4p  . metoprolol tartrate  25 mg Per Tube BID  . multivitamin with minerals  1 tablet Per Tube Daily  . OLANZapine  5 mg Per Tube BID  . cyanocobalamin  1,000 mcg Per Tube Daily  . [START ON 04/13/2017] warfarin  6 mg Per Tube Daily  . warfarin  7.5 mg Oral ONCE-1800  . Warfarin - Physician Dosing Inpatient   Does not apply q1800   Continuous Infusions: . feeding supplement (OSMOLITE 1.5 CAL) 1,000 mL (04/12/17 1212)   PRN Meds:.albuterol, bisacodyl, haloperidol lactate, [DISCONTINUED] ondansetron **OR** ondansetron (ZOFRAN) IV, polyethylene glycol, senna-docusate   Antibiotics   Anti-infectives    Start     Dose/Rate Route  Frequency Ordered Stop   04/11/17 1000  amoxicillin-clavulanate (AUGMENTIN) 400-57 MG/5ML suspension 800 mg     800 mg Per Tube Every 12 hours 04/11/17 0759     04/08/17 2100  vancomycin (VANCOCIN) 1,250 mg in sodium chloride 0.9 % 250 mL IVPB  Status:  Discontinued     1,250 mg 166.7 mL/hr over 90 Minutes Intravenous Every 24 hours 04/07/17 1951 04/11/17 0759   04/08/17 1500  vancomycin (VANCOCIN) 1,250 mg in sodium chloride 0.9 % 250 mL IVPB  Status:  Discontinued     1,250 mg 166.7 mL/hr over 90 Minutes Intravenous Every 24 hours 04/07/17 1542 04/07/17 1951   04/08/17 1400  vancomycin (VANCOCIN) 1,250 mg in sodium chloride 0.9 % 250 mL IVPB  Status:  Discontinued     1,250 mg 166.7 mL/hr over 90 Minutes Intravenous Every 24 hours 04/07/17 1446 04/07/17 1542   04/07/17 2300  ceFEPIme (MAXIPIME) 1 g in dextrose 5 % 50 mL IVPB  Status:  Discontinued     1 g 100 mL/hr over 30 Minutes Intravenous Every 24 hours 04/07/17 1536 04/11/17 0759   04/07/17 1500  vancomycin (VANCOCIN) 1,500 mg in sodium chloride 0.9 % 500 mL IVPB     1,500 mg 250 mL/hr over 120 Minutes Intravenous  Once 04/07/17 1446 04/07/17 2257   04/07/17 1445  piperacillin-tazobactam (ZOSYN) IVPB 2.25 g  Status:  Discontinued     2.25 g 100 mL/hr over 30 Minutes Intravenous  Once 04/07/17 1433 04/07/17 1438   04/07/17 1445  piperacillin-tazobactam (ZOSYN) IVPB 3.375 g     3.375 g 100 mL/hr over 30 Minutes Intravenous  Once 04/07/17 1439 04/07/17 1537        Subjective:   Curtis Keller was seen and examined today. Patient was trach and PEG, unable to obtain any review of system from the patient.    Objective:   Vitals:   04/12/17 0858 04/12/17 1020 04/12/17 1242 04/12/17 1402  BP:  118/68  105/76  Pulse:  80  79  Resp:  18  18  Temp:  98.3 F (36.8 C)  98.3 F (36.8 C)  TempSrc:  Oral  Oral  SpO2: 94% 95% 91% 92%  Weight:      Height:        Intake/Output Summary (Last 24 hours) at 04/12/17 1629 Last data  filed at 04/12/17 1212  Gross per 24 hour  Intake           1774.4 ml  Output             1450 ml  Net  324.4 ml     Wt Readings from Last 3 Encounters:  04/12/17 101.9 kg (224 lb 10.4 oz)  04/04/17 92.5 kg (204 lb)  04/03/17 92.5 kg (204 lb)     Exam  General: Left hemiparesis, PEG tube, trach collar  Eyes: PERRLA, EOMI, Anicteric Sclera,  HEENT:  trach collar, atraumatic, normocephalic  Cardiovascular: S1 S2 auscultated, no rubs, murmurs or gallops. Regular rate and rhythm.  Respiratory: Clear to auscultation bilaterally, no wheezing, rales or rhonchi  Gastrointestinal: Soft, nontender, nondistended, + bowel sounds  Ext: no pedal edema bilaterally  Neuro: does not follow any commands  Musculoskeletal: No digital cyanosis, clubbing  Skin: No rashes  Psych: does not follow any commands   Data Reviewed:  I have personally reviewed following labs and imaging studies  Micro Results Recent Results (from the past 240 hour(s))  Blood Culture (routine x 2)     Status: None (Preliminary result)   Collection Time: 04/07/17 12:50 PM  Result Value Ref Range Status   Specimen Description BLOOD RIGHT FOREARM  Final   Special Requests   Final    BOTTLES DRAWN AEROBIC AND ANAEROBIC Blood Culture adequate volume   Culture NO GROWTH 4 DAYS  Final   Report Status PENDING  Incomplete  Urine culture     Status: None   Collection Time: 04/07/17  3:12 PM  Result Value Ref Range Status   Specimen Description URINE, CATHETERIZED  Final   Special Requests NONE  Final   Culture NO GROWTH  Final   Report Status 04/08/2017 FINAL  Final  Blood Culture (routine x 2)     Status: None (Preliminary result)   Collection Time: 04/07/17  3:44 PM  Result Value Ref Range Status   Specimen Description BLOOD RIGHT FOREARM  Final   Special Requests IN PEDIATRIC BOTTLE Blood Culture adequate volume  Final   Culture NO GROWTH 4 DAYS  Final   Report Status PENDING  Incomplete     Radiology Reports Ct Abdomen Pelvis Wo Contrast  Result Date: 04/07/2017 CLINICAL DATA:  Anemia history of aortic aneurysm EXAM: CT ABDOMEN AND PELVIS WITHOUT CONTRAST TECHNIQUE: Multidetector CT imaging of the abdomen and pelvis was performed following the standard protocol without IV contrast. COMPARISON:  01/30/2017 FINDINGS: Lower chest: Patchy bibasilar opacities. No pleural effusion. Cardiomegaly with coronary artery calcification. Calcifications at the ventricular apex may relate to old infarct. Hepatobiliary: No focal hepatic abnormality. Possible small stones or sludge in the gallbladder. No biliary dilatation Pancreas: Unremarkable. No pancreatic ductal dilatation or surrounding inflammatory changes. Spleen: Small splenic tissue in the left upper quadrant with adjacent surgical change or calcification. Adrenals/Urinary Tract: Adrenal glands are within normal limits. Punctate nonobstructing stone in the mid right kidney. No hydronephrosis. The bladder is unremarkable Stomach/Bowel: Stomach is nonenlarged. There is a gastrostomy tube. No dilated small bowel. No colon wall thickening. Vascular/Lymphatic: Atherosclerotic vascular disease. No aneurysmal dilatation. No significantly enlarged lymph nodes. Reproductive: Prostate glands calcifications. Other: Ventral hernia containing mesenteric fat and small amount of bowel. No free air or free fluid Musculoskeletal: Status post left hip replacement with associated artifact. Probable old fracture left inferior pubic ramus. Old right-sided rib fractures. IMPRESSION: 1. Aortic atherosclerotic vascular disease but no aneurysmal dilatation. No evidence for retroperitoneal mass or hematoma. 2. Patchy bibasilar lung opacities, not significantly changed 3. Punctate nonobstructing stone in the mid right kidney 4. Possible small stones or sludge in the gallbladder 5. Ventral hernia containing mesenteric fat and small amount of bowel but no evidence  for an  obstruction Electronically Signed   By: Jasmine Pang M.D.   On: 04/07/2017 22:43   Dg Chest 2 View  Result Date: 03/17/2017 CLINICAL DATA:  61 year old with chronic ventilator dependent respiratory failure presenting with cough. Current history of chronic systolic congestive heart failure, hypertension, cardiomyopathy and COPD. EXAM: CHEST  2 VIEW 3:48 p.m.: COMPARISON:  Portable chest x-ray earlier today 1:43 p.m., 03/06/2017 and earlier. FINDINGS: AP erect and lateral images were obtained. Tracheostomy tube tip in satisfactory position below the thoracic inlet projecting approximately 5 cm above carina. Patchy airspace opacities in the lung bases, left greater than right, similar to the examination earlier today, new since 03/06/2017. Slight improvement in the interstitial pulmonary edema since earlier today. No new pulmonary parenchymal abnormalities since earlier today. IMPRESSION: 1. Tracheostomy tube tip in satisfactory position approximately 5 cm above carina. 2. Pneumonia involving the lung bases, left greater than right. 3. Improving interstitial pulmonary edema. Electronically Signed   By: Hulan Saas M.D.   On: 03/17/2017 16:14   Ct Chest Wo Contrast  Result Date: 03/29/2017 CLINICAL DATA:  Shortness of breath, history respiratory failure requiring increased oxygen usage today, tracheostomy dependent, denies chest pain EXAM: CT CHEST WITHOUT CONTRAST TECHNIQUE: Multidetector CT imaging of the chest was performed following the standard protocol without IV contrast. Sagittal and coronal MPR images reconstructed from axial data set. COMPARISON:  12/23/2016 FINDINGS: Cardiovascular: Atherosclerotic calcifications aorta, proximal great vessels, and coronary arteries ascending aorta aneurysmally dilated 4.5 cm transverse image 62. No pericardial effusion. Calcifications at cardiac apex question prior MI. Mediastinum/Nodes: Esophagus unremarkable. Tracheostomy tube within trachea. Scattered normal  sized mediastinal lymph nodes largest 10 mm RIGHT paratracheal node image 47 and 10 mm para-aortic node image 53. Base of cervical region unremarkable. Lungs/Pleura: Scattered peripheral chronic interstitial infiltrates in both lungs. Dependent atelectasis in both lower lobes improved since previous exam. Central peribronchial thickening greatest in lower lobes. Scattered subpleural blebs at the apices greater on RIGHT. RIGHT upper lobe nodular density 9 mm diameter image 42 unchanged. No pleural effusion or pneumothorax. Upper Abdomen: Gastrostomy tube within collapsed stomach. Post splenectomy with question splenule at splenectomy bed. Visualized upper abdomen otherwise unremarkable. Musculoskeletal: Old appearing compression deformity of an upper thoracic vertebra approximately T6. Bones demineralized. No other focal bony abnormalities. IMPRESSION: Aortic atherosclerosis and coronary arterial calcifications aneurysmal dilatation of the ascending thoracic aorta 4.5 cm transverse, recommendation below. Ascending thoracic aortic aneurysm. Recommend semi-annual imaging followup by CTA or MRA and referral to cardiothoracic surgery if not already obtained. This recommendation follows 2010 ACCF/AHA/AATS/ACR/ASA/SCA/SCAI/SIR/STS/SVM Guidelines for the Diagnosis and Management of Patients With Thoracic Aortic Disease. Circulation. 2010; 121: K093-G182 Mural calcification at cardiac apex question prior MI. Bronchitic changes with scattered chronic peripheral interstitial infiltrates in both lungs and improved bibasilar atelectasis. No definite acute infiltrate. Stable 9 mm RIGHT upper lobe nodule. Upper normal sized mediastinal lymph nodes without definite thoracic adenopathy. Electronically Signed   By: Ulyses Southward M.D.   On: 03/29/2017 12:45   Dg Chest Port 1 View  Result Date: 04/10/2017 CLINICAL DATA:  Shortness of breath EXAM: PORTABLE CHEST 1 VIEW COMPARISON:  04/10/2017 FINDINGS: Tracheostomy tube in place.  Bilateral interstitial and alveolar airspace opacities. No pleural effusion or pneumothorax. Stable cardiomegaly. No acute osseous abnormality. IMPRESSION: Mild pulmonary edema. Electronically Signed   By: Elige Ko   On: 04/10/2017 11:51   Dg Chest Port 1 View  Result Date: 04/10/2017 CLINICAL DATA:  Short of breath EXAM: PORTABLE CHEST 1 VIEW COMPARISON:  04/08/2017 FINDINGS: Cardiac enlargement. Progression of bilateral airspace disease most likely due to pulmonary edema. Bibasilar airspace disease with some progression on the right. IMPRESSION: Mild progression of bilateral airspace disease.  Probable edema. Progression of right lower lobe airspace disease. Possible pneumonia Electronically Signed   By: Marlan Palau M.D.   On: 04/10/2017 07:30   Dg Chest Port 1 View  Result Date: 04/08/2017 CLINICAL DATA:  Tracheostomy tube malfunctioning. Initial encounter. EXAM: PORTABLE CHEST 1 VIEW COMPARISON:  Chest radiograph performed earlier today at 6:44 a.m. FINDINGS: The patient's tracheostomy tube is seen ending 7-8 cm above the carina. It is grossly stable in appearance. Patchy bibasilar airspace opacities raise concern for multifocal pneumonia, redistributed from the prior study, though interstitial edema might have a similar appearance. Underlying vascular congestion is noted. No pleural effusion or pneumothorax is seen, though the left costophrenic angle is incompletely imaged on this study. The cardiomediastinal silhouette is borderline enlarged. No acute osseous abnormalities are seen. There is chronic deformity of the right mid clavicle. IMPRESSION: 1. Tracheostomy tube seen ending 7-8 cm above the carina. It is grossly stable in appearance. 2. Patchy bibasilar airspace opacities raise concern for multifocal pneumonia, redistributed from the recent prior study, though interstitial edema might have a similar appearance. 3. Underlying vascular congestion and borderline cardiomegaly. Electronically  Signed   By: [name] M.D.   On: 04/08/2017 21:37   Dg Chest Port 1 View  Result Date: 04/08/2017 CLINICAL DATA:  Sepsis EXAM: PORTABLE CHEST 1 VIEW COMPARISON:  04/07/2017 FINDINGS: Left lower lobe airspace disease unchanged. Mild right upper lobe airspace disease has progressed. Negative for effusion. Tracheostomy remains in good position. IMPRESSION: Left lower lobe airspace disease unchanged. Progression of mild right upper lobe airspace disease. Possible pneumonia. Electronically Signed   By: Marlan Palau M.D.   On: 04/08/2017 09:34   Dg Chest Port 1 View  Result Date: 04/07/2017 CLINICAL DATA:  Short of breath fever EXAM: PORTABLE CHEST 1 VIEW COMPARISON:  03/27/2017 FINDINGS: Tracheostomy remains in good position. Bibasilar airspace disease is mildly improved. Negative for heart failure or effusion. IMPRESSION: Partial clearing of bibasilar atelectasis/infiltrate. Electronically Signed   By: Marlan Palau M.D.   On: 04/07/2017 13:01   Dg Chest Port 1 View  Result Date: 03/27/2017 CLINICAL DATA:  Shortness of Breath EXAM: PORTABLE CHEST 1 VIEW COMPARISON:  March 24, 2017 FINDINGS: Tracheostomy catheter tip is 4.5 cm above the carina. No pneumothorax. There is patchy airspace consolidation in the left base. There is mild bibasilar atelectasis as well. Heart is borderline enlarged with pulmonary vascularity within normal limits. No adenopathy. No bone lesions. IMPRESSION: Tracheostomy as described without pneumothorax. Patchy airspace opacity left base, suspect a degree of pneumonia. Patchy atelectasis in the bases as well. Stable cardiac silhouette. Electronically Signed   By: Bretta Bang III M.D.   On: 03/27/2017 09:32   Dg Chest Port 1 View  Result Date: 03/24/2017 CLINICAL DATA:  Hypoxia EXAM: PORTABLE CHEST 1 VIEW COMPARISON:  Two days ago FINDINGS: Tracheostomy tube in place. Cardiomegaly and diffuse interstitial opacity. Low lung volumes. Small layering pleural effusions are not  excluded. No evidence of pneumothorax. IMPRESSION: 1. Cardiomegaly and mild pulmonary edema. 2. Low volume chest with atelectasis. 3. No change from study 2 days prior. Electronically Signed   By: Marnee Spring M.D.   On: 03/24/2017 14:47   Dg Chest Port 1 View  Result Date: 03/22/2017 CLINICAL DATA:  Hypoxia EXAM: PORTABLE CHEST 1 VIEW COMPARISON:  03/19/2017; 03/17/2017;  03/06/2017 FINDINGS: Grossly unchanged enlarged cardiac silhouette and mediastinal contours given AP projection. Stable position of support apparatus. Pulmonary vasculature remains indistinct with cephalization of flow. Minimally improved aeration of lungs with persistent bibasilar opacities, left greater than right. No new focal airspace opacities. No definite pleural effusion or pneumothorax. No acute osseus abnormalities. IMPRESSION: 1.  Stable positioning of support apparatus.  No pneumothorax. 2. Minimally improved bibasilar atelectasis with otherwise similar findings of mild pulmonary edema. Electronically Signed   By: [name] M.D.   On: 03/22/2017 09:15   Dg Chest Port 1 View  Result Date: 03/19/2017 CLINICAL DATA:  Shortness of breath EXAM: PORTABLE CHEST 1 VIEW COMPARISON:  03/17/2017; 03/06/2017; 01/27/2017 FINDINGS: Unchanged enlarged cardiac silhouette and mediastinal contours. Stable position of support apparatus. Pulmonary vasculature appears less distinct with cephalization of flow. Interval development of trace right and small left-sided effusion with associated worsening bibasilar opacities, left greater than right. No pneumothorax. No acute osseus abnormalities. Old right anterior rib fractures. IMPRESSION: 1.  No acute cardiopulmonary disease. 2. Suspected worsening pulmonary edema and bibasilar atelectasis, left greater than right. Electronically Signed   By: [name] M.D.   On: 03/19/2017 13:40   Dg Chest Port 1 View  Result Date: 03/17/2017 CLINICAL DATA:  Shortness of breath EXAM: PORTABLE CHEST 1 VIEW  COMPARISON:  03/06/2017, 02/20/2017, 02/08/2017 FINDINGS: Tracheostomy tube tip about 2.4 cm superior to the carina and projects over the tracheal air column. Cardiomegaly with central vascular congestion and mild interstitial edema. Streaky opacity at the left base may reflect atelectasis or an infiltrate. Old right clavicular fracture and old rib fractures. IMPRESSION: Tracheostomy tube tip projects over tracheal air column and tip is about 2.4 cm superior to carina Cardiomegaly with central vascular congestion and mild diffuse edema. Streaky atelectasis or pneumonia at the left base. Electronically Signed   By: Jasmine Pang M.D.   On: 03/17/2017 14:02    Lab Data:  CBC:  Recent Labs Lab 04/07/17 1243  04/08/17 0429 04/09/17 0545 04/10/17 0311 04/11/17 0359 04/12/17 0525  WBC 25.3*  --  11.3* 11.3* 11.5* 11.6* 11.7*  NEUTROABS 13.9*  --   --   --   --   --   --   HGB 4.6*  < > 12.7* 11.8* 12.1* 11.9* 12.3*  HCT 14.7*  < > 39.9 37.7* 38.6* 38.6* 39.4  MCV 101.4*  --  96.8 97.9 98.0 98.2 97.8  PLT 389  --  220 205 206 218 199  < > = values in this interval not displayed. Basic Metabolic Panel:  Recent Labs Lab 04/07/17 1243 04/07/17 1426 04/08/17 0429 04/09/17 0545 04/11/17 0359 04/11/17 0730 04/12/17 0525  NA 143 146* 142 142 140  --  138  K 4.0 3.7 4.0 3.7 4.3  --  4.4  CL 107 109 108 110 108  --  107  CO2 27  --  26 25 25   --  22  GLUCOSE 102* 98 146* 135* 128*  --  134*  BUN 52* 48* 50* 48* 51*  --  55*  CREATININE 2.59* 2.60* 2.43* 2.08* 1.73*  --  1.68*  CALCIUM 9.7  --  9.3 9.2 9.2  --  9.4  MG  --   --   --   --   --  2.0  --    GFR: Estimated Creatinine Clearance: 58.1 mL/min (A) (by C-G formula based on SCr of 1.68 mg/dL (H)). Liver Function Tests:  Recent Labs Lab 04/07/17 1243 04/08/17  1610 04/09/17 0545  AST 38 34 27  ALT 28 26 21   ALKPHOS 109 105 129*  BILITOT 0.6 0.8 0.6  PROT 8.4* 7.5 7.1  ALBUMIN 2.4* 2.0* 1.8*   No results for input(s):  LIPASE, AMYLASE in the last 168 hours. No results for input(s): AMMONIA in the last 168 hours. Coagulation Profile:  Recent Labs Lab 04/08/17 0429 04/09/17 0545 04/10/17 0311 04/11/17 0359 04/12/17 0525  INR 2.95 2.72 2.43 1.68 1.40   Cardiac Enzymes: No results for input(s): CKTOTAL, CKMB, CKMBINDEX, TROPONINI in the last 168 hours. BNP (last 3 results) No results for input(s): PROBNP in the last 8760 hours. HbA1C: No results for input(s): HGBA1C in the last 72 hours. CBG:  Recent Labs Lab 04/11/17 1127 04/11/17 2000 04/12/17 0001 04/12/17 0342 04/12/17 1215  GLUCAP 130* 142* 130* 139* 139*   Lipid Profile: No results for input(s): CHOL, HDL, LDLCALC, TRIG, CHOLHDL, LDLDIRECT in the last 72 hours. Thyroid Function Tests: No results for input(s): TSH, T4TOTAL, FREET4, T3FREE, THYROIDAB in the last 72 hours. Anemia Panel: No results for input(s): VITAMINB12, FOLATE, FERRITIN, TIBC, IRON, RETICCTPCT in the last 72 hours. Urine analysis:    Component Value Date/Time   COLORURINE YELLOW 04/07/2017 1512   APPEARANCEUR CLEAR 04/07/2017 1512   LABSPEC 1.016 04/07/2017 1512   PHURINE 7.0 04/07/2017 1512   GLUCOSEU NEGATIVE 04/07/2017 1512   HGBUR LARGE (A) 04/07/2017 1512   BILIRUBINUR NEGATIVE 04/07/2017 1512   KETONESUR NEGATIVE 04/07/2017 1512   PROTEINUR 100 (A) 04/07/2017 1512   NITRITE NEGATIVE 04/07/2017 1512   LEUKOCYTESUR NEGATIVE 04/07/2017 1512     Moss Berry M.D. [facility] 04/12/2017, 4:29 PM  Pager: [phone] Between 7am to 7pm - call Pager - [phone]  After 7pm go to [email or web address] - password [email or web address]  Call night coverage person covering after 7pm

## 2017-04-12 NOTE — Progress Notes (Signed)
Spoke with RRT regarding pt's oxygen sat staying in high 80s, therapist stated is okay with patient's levels as patient not in any distress.  Continue to monitor.

## 2017-04-13 ENCOUNTER — Emergency Department (HOSPITAL_COMMUNITY): Payer: Medicare Other

## 2017-04-13 ENCOUNTER — Emergency Department (HOSPITAL_COMMUNITY)
Admission: EM | Admit: 2017-04-13 | Discharge: 2017-04-14 | Disposition: A | Discharge status: Home / Self Care | Payer: Medicare Other | Attending: Emergency Medicine | Admitting: Emergency Medicine

## 2017-04-13 ENCOUNTER — Encounter (HOSPITAL_COMMUNITY): Payer: Self-pay | Admitting: Emergency Medicine

## 2017-04-13 DIAGNOSIS — F05 Delirium due to known physiological condition: Secondary | ICD-10-CM | POA: Diagnosis not present

## 2017-04-13 DIAGNOSIS — J9503 Malfunction of tracheostomy stoma: Secondary | ICD-10-CM | POA: Diagnosis not present

## 2017-04-13 DIAGNOSIS — I2510 Atherosclerotic heart disease of native coronary artery without angina pectoris: Secondary | ICD-10-CM | POA: Diagnosis not present

## 2017-04-13 DIAGNOSIS — Z794 Long term (current) use of insulin: Secondary | ICD-10-CM | POA: Diagnosis not present

## 2017-04-13 DIAGNOSIS — N179 Acute kidney failure, unspecified: Secondary | ICD-10-CM | POA: Diagnosis not present

## 2017-04-13 DIAGNOSIS — I69154 Hemiplegia and hemiparesis following nontraumatic intracerebral hemorrhage affecting left non-dominant side: Secondary | ICD-10-CM | POA: Diagnosis not present

## 2017-04-13 DIAGNOSIS — I509 Heart failure, unspecified: Secondary | ICD-10-CM | POA: Diagnosis not present

## 2017-04-13 DIAGNOSIS — J411 Mucopurulent chronic bronchitis: Secondary | ICD-10-CM | POA: Diagnosis not present

## 2017-04-13 DIAGNOSIS — Z87891 Personal history of nicotine dependence: Secondary | ICD-10-CM | POA: Diagnosis not present

## 2017-04-13 DIAGNOSIS — A4102 Sepsis due to Methicillin resistant Staphylococcus aureus: Secondary | ICD-10-CM | POA: Diagnosis not present

## 2017-04-13 DIAGNOSIS — I5022 Chronic systolic (congestive) heart failure: Secondary | ICD-10-CM | POA: Diagnosis not present

## 2017-04-13 DIAGNOSIS — E46 Unspecified protein-calorie malnutrition: Secondary | ICD-10-CM | POA: Diagnosis not present

## 2017-04-13 DIAGNOSIS — I429 Cardiomyopathy, unspecified: Secondary | ICD-10-CM | POA: Diagnosis not present

## 2017-04-13 DIAGNOSIS — R4182 Altered mental status, unspecified: Secondary | ICD-10-CM | POA: Diagnosis not present

## 2017-04-13 DIAGNOSIS — I252 Old myocardial infarction: Secondary | ICD-10-CM | POA: Diagnosis not present

## 2017-04-13 DIAGNOSIS — J9621 Acute and chronic respiratory failure with hypoxia: Secondary | ICD-10-CM | POA: Diagnosis not present

## 2017-04-13 DIAGNOSIS — R6520 Severe sepsis without septic shock: Secondary | ICD-10-CM | POA: Diagnosis not present

## 2017-04-13 DIAGNOSIS — E119 Type 2 diabetes mellitus without complications: Secondary | ICD-10-CM | POA: Insufficient documentation

## 2017-04-13 DIAGNOSIS — I255 Ischemic cardiomyopathy: Secondary | ICD-10-CM | POA: Diagnosis not present

## 2017-04-13 DIAGNOSIS — R0902 Hypoxemia: Secondary | ICD-10-CM | POA: Diagnosis not present

## 2017-04-13 DIAGNOSIS — Z96642 Presence of left artificial hip joint: Secondary | ICD-10-CM | POA: Insufficient documentation

## 2017-04-13 DIAGNOSIS — A419 Sepsis, unspecified organism: Secondary | ICD-10-CM | POA: Diagnosis not present

## 2017-04-13 DIAGNOSIS — Z431 Encounter for attention to gastrostomy: Secondary | ICD-10-CM | POA: Diagnosis not present

## 2017-04-13 DIAGNOSIS — J9509 Other tracheostomy complication: Secondary | ICD-10-CM | POA: Diagnosis not present

## 2017-04-13 DIAGNOSIS — Z930 Tracheostomy status: Secondary | ICD-10-CM | POA: Diagnosis not present

## 2017-04-13 DIAGNOSIS — J9620 Acute and chronic respiratory failure, unspecified whether with hypoxia or hypercapnia: Secondary | ICD-10-CM | POA: Diagnosis not present

## 2017-04-13 DIAGNOSIS — G8114 Spastic hemiplegia affecting left nondominant side: Secondary | ICD-10-CM | POA: Diagnosis not present

## 2017-04-13 DIAGNOSIS — I236 Thrombosis of atrium, auricular appendage, and ventricle as current complications following acute myocardial infarction: Secondary | ICD-10-CM | POA: Diagnosis not present

## 2017-04-13 DIAGNOSIS — I639 Cerebral infarction, unspecified: Secondary | ICD-10-CM | POA: Diagnosis not present

## 2017-04-13 DIAGNOSIS — N183 Chronic kidney disease, stage 3 (moderate): Secondary | ICD-10-CM | POA: Diagnosis not present

## 2017-04-13 DIAGNOSIS — R092 Respiratory arrest: Secondary | ICD-10-CM | POA: Diagnosis not present

## 2017-04-13 DIAGNOSIS — R05 Cough: Secondary | ICD-10-CM | POA: Diagnosis not present

## 2017-04-13 DIAGNOSIS — N184 Chronic kidney disease, stage 4 (severe): Secondary | ICD-10-CM | POA: Diagnosis not present

## 2017-04-13 DIAGNOSIS — J9500 Unspecified tracheostomy complication: Secondary | ICD-10-CM | POA: Diagnosis not present

## 2017-04-13 DIAGNOSIS — F419 Anxiety disorder, unspecified: Secondary | ICD-10-CM | POA: Diagnosis not present

## 2017-04-13 DIAGNOSIS — I130 Hypertensive heart and chronic kidney disease with heart failure and stage 1 through stage 4 chronic kidney disease, or unspecified chronic kidney disease: Secondary | ICD-10-CM | POA: Diagnosis not present

## 2017-04-13 DIAGNOSIS — T83498A Other mechanical complication of other prosthetic devices, implants and grafts of genital tract, initial encounter: Secondary | ICD-10-CM | POA: Diagnosis not present

## 2017-04-13 DIAGNOSIS — J449 Chronic obstructive pulmonary disease, unspecified: Secondary | ICD-10-CM | POA: Insufficient documentation

## 2017-04-13 DIAGNOSIS — I110 Hypertensive heart disease with heart failure: Secondary | ICD-10-CM | POA: Insufficient documentation

## 2017-04-13 DIAGNOSIS — J9611 Chronic respiratory failure with hypoxia: Secondary | ICD-10-CM | POA: Diagnosis not present

## 2017-04-13 DIAGNOSIS — F0390 Unspecified dementia without behavioral disturbance: Secondary | ICD-10-CM | POA: Diagnosis not present

## 2017-04-13 DIAGNOSIS — R0989 Other specified symptoms and signs involving the circulatory and respiratory systems: Secondary | ICD-10-CM | POA: Diagnosis not present

## 2017-04-13 DIAGNOSIS — L89159 Pressure ulcer of sacral region, unspecified stage: Secondary | ICD-10-CM | POA: Diagnosis not present

## 2017-04-13 DIAGNOSIS — Z7901 Long term (current) use of anticoagulants: Secondary | ICD-10-CM | POA: Diagnosis not present

## 2017-04-13 DIAGNOSIS — I517 Cardiomegaly: Secondary | ICD-10-CM | POA: Diagnosis not present

## 2017-04-13 DIAGNOSIS — Z9981 Dependence on supplemental oxygen: Secondary | ICD-10-CM | POA: Diagnosis not present

## 2017-04-13 DIAGNOSIS — I69354 Hemiplegia and hemiparesis following cerebral infarction affecting left non-dominant side: Secondary | ICD-10-CM | POA: Diagnosis not present

## 2017-04-13 DIAGNOSIS — Z7983 Long term (current) use of bisphosphonates: Secondary | ICD-10-CM | POA: Diagnosis not present

## 2017-04-13 DIAGNOSIS — I69359 Hemiplegia and hemiparesis following cerebral infarction affecting unspecified side: Secondary | ICD-10-CM | POA: Diagnosis not present

## 2017-04-13 DIAGNOSIS — F39 Unspecified mood [affective] disorder: Secondary | ICD-10-CM | POA: Diagnosis not present

## 2017-04-13 DIAGNOSIS — Z22322 Carrier or suspected carrier of Methicillin resistant Staphylococcus aureus: Secondary | ICD-10-CM | POA: Diagnosis not present

## 2017-04-13 DIAGNOSIS — Z430 Encounter for attention to tracheostomy: Secondary | ICD-10-CM | POA: Diagnosis not present

## 2017-04-13 DIAGNOSIS — E785 Hyperlipidemia, unspecified: Secondary | ICD-10-CM | POA: Diagnosis not present

## 2017-04-13 DIAGNOSIS — D62 Acute posthemorrhagic anemia: Secondary | ICD-10-CM | POA: Diagnosis not present

## 2017-04-13 DIAGNOSIS — Z9889 Other specified postprocedural states: Secondary | ICD-10-CM | POA: Diagnosis not present

## 2017-04-13 DIAGNOSIS — K219 Gastro-esophageal reflux disease without esophagitis: Secondary | ICD-10-CM | POA: Diagnosis not present

## 2017-04-13 DIAGNOSIS — Z79899 Other long term (current) drug therapy: Secondary | ICD-10-CM | POA: Diagnosis not present

## 2017-04-13 DIAGNOSIS — J690 Pneumonitis due to inhalation of food and vomit: Secondary | ICD-10-CM | POA: Diagnosis not present

## 2017-04-13 DIAGNOSIS — E1122 Type 2 diabetes mellitus with diabetic chronic kidney disease: Secondary | ICD-10-CM | POA: Diagnosis not present

## 2017-04-13 DIAGNOSIS — J189 Pneumonia, unspecified organism: Secondary | ICD-10-CM | POA: Diagnosis not present

## 2017-04-13 DIAGNOSIS — R0602 Shortness of breath: Secondary | ICD-10-CM | POA: Diagnosis not present

## 2017-04-13 DIAGNOSIS — J9600 Acute respiratory failure, unspecified whether with hypoxia or hypercapnia: Secondary | ICD-10-CM | POA: Diagnosis not present

## 2017-04-13 DIAGNOSIS — Z955 Presence of coronary angioplasty implant and graft: Secondary | ICD-10-CM | POA: Insufficient documentation

## 2017-04-13 DIAGNOSIS — I513 Intracardiac thrombosis, not elsewhere classified: Secondary | ICD-10-CM | POA: Diagnosis not present

## 2017-04-13 DIAGNOSIS — F329 Major depressive disorder, single episode, unspecified: Secondary | ICD-10-CM | POA: Diagnosis not present

## 2017-04-13 DIAGNOSIS — I5023 Acute on chronic systolic (congestive) heart failure: Secondary | ICD-10-CM | POA: Insufficient documentation

## 2017-04-13 DIAGNOSIS — B1920 Unspecified viral hepatitis C without hepatic coma: Secondary | ICD-10-CM | POA: Diagnosis not present

## 2017-04-13 LAB — BASIC METABOLIC PANEL
Anion gap: 9 (ref 5–15)
BUN: 59 mg/dL — AB (ref 6–20)
CO2: 26 mmol/L (ref 22–32)
Calcium: 9.6 mg/dL (ref 8.9–10.3)
Chloride: 103 mmol/L (ref 101–111)
Creatinine, Ser: 1.65 mg/dL — ABNORMAL HIGH (ref 0.61–1.24)
GFR calc Af Amer: 50 mL/min — ABNORMAL LOW (ref >60)
GFR calc non Af Amer: 43 mL/min — ABNORMAL LOW (ref >60)
GLUCOSE: 137 mg/dL — AB (ref 65–99)
POTASSIUM: 5.1 mmol/L (ref 3.5–5.1)
Sodium: 138 mmol/L (ref 135–145)

## 2017-04-13 LAB — CBC WITH DIFFERENTIAL/PLATELET
Basophils Absolute: 0.1 10*3/uL (ref 0.0–0.1)
Basophils Relative: 1 %
EOS ABS: 0.9 10*3/uL — AB (ref 0.0–0.7)
Eosinophils Relative: 8 %
HEMATOCRIT: 42.5 % (ref 39.0–52.0)
HEMOGLOBIN: 13.8 g/dL (ref 13.0–17.0)
LYMPHS ABS: 4.7 10*3/uL — AB (ref 0.7–4.0)
LYMPHS PCT: 42 %
MCH: 31.5 pg (ref 26.0–34.0)
MCHC: 32.5 g/dL (ref 30.0–36.0)
MCV: 97 fL (ref 78.0–100.0)
MONOS PCT: 10 %
Monocytes Absolute: 1.1 10*3/uL — ABNORMAL HIGH (ref 0.1–1.0)
NEUTROS ABS: 4.4 10*3/uL (ref 1.7–7.7)
NEUTROS PCT: 39 %
Platelets: 226 10*3/uL (ref 150–400)
RBC: 4.38 MIL/uL (ref 4.22–5.81)
RDW: 14.6 % (ref 11.5–15.5)
WBC: 11.2 10*3/uL — AB (ref 4.0–10.5)

## 2017-04-13 LAB — PROTIME-INR
INR: 1.43
INR: 1.3
PROTHROMBIN TIME: 16.3 s — AB (ref 11.4–15.2)
Prothrombin Time: 17.6 seconds — ABNORMAL HIGH (ref 11.4–15.2)

## 2017-04-13 LAB — GLUCOSE, CAPILLARY
GLUCOSE-CAPILLARY: 151 mg/dL — AB (ref 65–99)
GLUCOSE-CAPILLARY: 151 mg/dL — AB (ref 65–99)
Glucose-Capillary: 125 mg/dL — ABNORMAL HIGH (ref 65–99)

## 2017-04-13 LAB — CBC
HEMATOCRIT: 40.6 % (ref 39.0–52.0)
Hemoglobin: 12.8 g/dL — ABNORMAL LOW (ref 13.0–17.0)
MCH: 30.8 pg (ref 26.0–34.0)
MCHC: 31.5 g/dL (ref 30.0–36.0)
MCV: 97.6 fL (ref 78.0–100.0)
PLATELETS: 196 10*3/uL (ref 150–400)
RBC: 4.16 MIL/uL — AB (ref 4.22–5.81)
RDW: 14.8 % (ref 11.5–15.5)
WBC: 10.6 10*3/uL — AB (ref 4.0–10.5)

## 2017-04-13 MED ORDER — SODIUM POLYSTYRENE SULFONATE 15 GM/60ML PO SUSP
30.0000 g | Freq: Once | ORAL | Status: AC
  Administered 2017-04-13: 30 g
  Filled 2017-04-13: qty 120

## 2017-04-13 MED ORDER — AMOXICILLIN-POT CLAVULANATE 400-57 MG/5ML PO SUSR: 800.0000 mg | Freq: Two times a day (BID) | ORAL | 0 refills | Status: DC

## 2017-04-13 MED ORDER — CYANOCOBALAMIN 1000 MCG PO TABS: 1000.0000 ug | ORAL_TABLET | Freq: Every day | ORAL | Status: DC

## 2017-04-13 MED ORDER — ENOXAPARIN SODIUM 100 MG/ML ~~LOC~~ SOLN: 100.0000 mg | Freq: Two times a day (BID) | SUBCUTANEOUS | Status: DC

## 2017-04-13 MED ORDER — SODIUM CHLORIDE 0.9 % IV SOLN
1.0000 g | Freq: Once | INTRAVENOUS | Status: AC
  Administered 2017-04-13: 1 g via INTRAVENOUS
  Filled 2017-04-13: qty 10

## 2017-04-13 MED ORDER — ACETYLCYSTEINE 20 % IN SOLN: 4.0000 mL | Freq: Two times a day (BID) | RESPIRATORY_TRACT | 12 refills | Status: DC

## 2017-04-13 MED ORDER — BISACODYL 10 MG RE SUPP: 10.0000 mg | Freq: Every day | RECTAL | 0 refills | Status: DC | PRN

## 2017-04-13 MED ORDER — LIDOCAINE 5 % EX PTCH: 2.0000 | MEDICATED_PATCH | CUTANEOUS | 0 refills | Status: DC

## 2017-04-13 MED ORDER — SENNA 8.6 MG PO TABS: 1.0000 | ORAL_TABLET | Freq: Every day | ORAL | 0 refills | Status: DC | PRN

## 2017-04-13 MED ORDER — ALBUTEROL SULFATE (2.5 MG/3ML) 0.083% IN NEBU: 2.5000 mg | INHALATION_SOLUTION | RESPIRATORY_TRACT | 12 refills | Status: DC | PRN

## 2017-04-13 MED ORDER — IPRATROPIUM-ALBUTEROL 0.5-2.5 (3) MG/3ML IN SOLN: 3.0000 mL | Freq: Four times a day (QID) | RESPIRATORY_TRACT | Status: DC

## 2017-04-13 NOTE — Progress Notes (Signed)
[facility] will [facility] Anticipated discharge date: 6/21 Family notified: pt sister Transportation by [employer]- called at 2:15pm- they currently estimate an hour behind  CSW signing off.  Burna Sis, LCSW Clinical Social Worker [phone]

## 2017-04-13 NOTE — Progress Notes (Signed)
Patient's sister reports that  She would like for patient to discharge back to [facility]. She has spoken with their Nursing Director and feels that her concerns will be addressed.   Osborne Casco Velencia Lenart LCSWA [phone]

## 2017-04-13 NOTE — ED Triage Notes (Signed)
Patient arrived with EMS from [facility] nursing home , staff reported pt. pulled his tracheostomy and condom catheter out this evening . O2 sat= 97 % at 2 lpm per EMS prior to arrival .

## 2017-04-13 NOTE — Discharge Summary (Addendum)
Physician Discharge Summary   Patient ID: Curtis Keller MRN: [id number] DOB/AGE: 1955-10-26 61 y.o.  Admit date: 04/07/2017 Discharge date: 04/13/2017  Primary Care Physician:  [facility]  Discharge Diagnoses:   . Acute on chronic respiratory failure with hypoxia (HCC) . Sepsis (HCC) . Apical mural thrombus . Heart failure, chronic systolic (HCC) . Hypertension . Depression . Protein calorie malnutrition (HCC) . MRSA (methicillin resistant Staphylococcus aureus) septicemia (HCC) . Aspiration pneumonia (HCC) . Acute blood loss anemia . Hyperlipidemia . COPD (chronic obstructive pulmonary disease) (HCC) . GERD (gastroesophageal reflux disease) . Chronic respiratory failure (HCC) . dysphagia   Prolonged QTC   Consults:  Critical care  Recommendations for Outpatient Follow-up:  1. Please continue Lovenox bridging with Coumadin until INR therapeutic between 2-3 and stable. Please check INR on 6/22 and then on 6/25 2. Please repeat CBC/BMET at next visit 3. Follow EKG periodically   DIET: Tube feeds Osmolite 1.5 CAL     Allergies:   Allergies  Allergen Reactions  . Codeine Anaphylaxis and Nausea And Vomiting  . Lisinopril Itching and Rash  . Hydrocodone Other (See Comments)    On MAR  . Hydrocodone-Acetaminophen Nausea And Vomiting    Cannot take any acetaminophen due to liver issues per sister  . Tegaderm Ag Mesh [Silver] Other (See Comments)    Allergy to "silver compounds" listed on MAR  . Tylenol [Acetaminophen] Nausea Only and Other (See Comments)    Cannot take any acetaminophen due to liver issues per sister     DISCHARGE MEDICATIONS: Current Discharge Medication List    START taking these medications   Details  acetylcysteine (MUCOMYST) 20 % nebulizer solution Take 4 mLs by nebulization 2 (two) times daily. Qty: 30 mL, Refills: 12    albuterol (PROVENTIL) (2.5 MG/3ML) 0.083% nebulizer solution Take 3 mLs (2.5 mg total) by  nebulization every 4 (four) hours as needed for wheezing or shortness of breath. Qty: 75 mL, Refills: 12    amoxicillin-clavulanate (AUGMENTIN) 400-57 MG/5ML suspension Place 10 mLs (800 mg total) into feeding tube 2 (two) times daily. X 7 more days Qty: 100 mL, Refills: 0    bisacodyl (DULCOLAX) 10 MG suppository Place 1 suppository (10 mg total) rectally daily as needed for moderate constipation. Qty: 12 suppository, Refills: 0    enoxaparin (LOVENOX) 100 MG/ML injection Inject 1 mL (100 mg total) into the skin 2 (two) times daily. Continue lovenox bridge until INR therapeutic stable between 2-3, then off Qty: 0 Syringe    ipratropium-albuterol (DUONEB) 0.5-2.5 (3) MG/3ML SOLN Take 3 mLs by nebulization 4 (four) times daily. Qty: 360 mL      CONTINUE these medications which have CHANGED   Details  lidocaine (LIDODERM) 5 % Place 2 patches onto the skin See admin instructions. Apply 2 patches to left knee every morning -remove & discard patch within 12 hours or as directed by MD Qty: 5 patch, Refills: 0    senna (SENOKOT) 8.6 MG TABS tablet Place 1 tablet (8.6 mg total) into feeding tube daily as needed for mild constipation. For constipation Qty: 120 each, Refills: 0    !! vitamin B-12 1000 MCG tablet Place 1 tablet (1,000 mcg total) into feeding tube daily.     !! - Potential duplicate medications found. Please discuss with provider.    CONTINUE these medications which have NOT CHANGED   Details  Amino Acids-Protein Hydrolys (FEEDING SUPPLEMENT, PRO-STAT SUGAR FREE 64,) LIQD Place 60 mLs into feeding tube 2 (two) times  daily.     atorvastatin (LIPITOR) 40 MG tablet Place 40 mg into feeding tube at bedtime. for Lipids    famotidine (PEPCID) 20 MG tablet Place 20 mg into feeding tube 2 (two) times daily. for GERD    folic acid (FOLVITE) 1 MG tablet Place 1 mg into feeding tube daily.     guaiFENesin 200 MG tablet Place 200 mg into feeding tube 3 (three) times daily. for CHF     metoprolol tartrate (LOPRESSOR) 25 MG tablet Place 25 mg into feeding tube 2 (two) times daily.    Multiple Vitamin (MULTIVITAMIN WITH MINERALS) TABS tablet Place 1 tablet into feeding tube daily.    Nutritional Supplements (FEEDING SUPPLEMENT, OSMOLITE 1.5 CAL,) LIQD Place into feeding tube See admin instructions. 80 cc/hr 4p to 10a per order dated 04/06/17    OLANZapine (ZYPREXA) 5 MG tablet Place 5 mg into feeding tube 2 (two) times daily.     Omega-3 Fatty Acids (FISH OIL) 1000 MG CAPS Place 1,000 mg into feeding tube daily.    OXYGEN See admin instructions. Oxygen at 28% via trach cuff every shift    polyethylene glycol (MIRALAX / GLYCOLAX) packet Place 17 g into feeding tube daily. for constipation    tamsulosin (FLOMAX) 0.4 MG CAPS capsule 0.4 mg daily at 6 PM. Give 1 capsule (0.4 mg) via PEG-Tube one time daily    !! vitamin B-12 (CYANOCOBALAMIN) 1000 MCG tablet Take 1,000 mcg by mouth daily.    warfarin (COUMADIN) 6 MG tablet Place 6 mg into feeding tube daily at 6 PM.     Water For Irrigation, Sterile (FREE WATER) SOLN Place 150 mLs into feeding tube every 4 (four) hours. Qty: 1000 mL, Refills: 1     !! - Potential duplicate medications found. Please discuss with provider.    STOP taking these medications     oxyCODONE-acetaminophen (PERCOCET/ROXICET) 5-325 MG tablet      tiZANidine (ZANAFLEX) 2 MG tablet          Brief H and P: For complete details please refer to admission H and P, but in brief Curtis Keller a 61 y.o.malewith extensive medical history listed below, including tracheostomy dependent, on oxygen via trach, dysphagia, status post PEG tubefeeding, COPD, CVA in February 2017 with residual L hemiparesis/contracturs, chronic systolic CHF, ICM with EF 25-30 %, CKD 3, chronic anemia, dementia, recurrent falls, recent heat fracture surgery, a history of LV apical thrombosis initially on Eliquis thenswitched towarfarin due to development of CVA ,  history od AAA without rupture as of last discharge, Hep C andrecent admission for aspiration pneumonia with hypoxia discharged on 03/30/2017 to SNF, was readmitted on 04/07/2017 for fever and shortness of breath due to pneumonia.  Hospital Course:  Recurrent pneumonia, acute on chronic hypoxic respiratory failure - Patient was placed on empiric IV antibiotics, now positioned to Augmentin via PEG tube for 7 days - Continue trach collar, nebulizer treatment, Mucomyst  - trach in position, critical care has evaluated the patient   Chronic systolic CHF, EF 83%, history of LV apical thrombus - EF 25%, continue beta blocker via PEG tube - Continue Coumadin with Lovenox, INR subtherapeutic, 1.43 at discharge - Continue Lovenox bridge until INR therapeutic between 2 and 3.    CVA - Dense left-sided hemiparesis, dysphagia, continue Coumadin, statin  Dyslipidemia - Continue statin via PEG tube  Hypertension - Continue beta blocker  Anemia of chronic disease - H&H currently stable, continue to monitor   Mild acute renal failure  on chronic kidney disease stage IV - Baseline creatinine around 2.4, currently stable, creatinine 1.65 at the time of discharge  Dementia - Continue Zyprexa  Dysphagia - Continue PEG tube, tube feedings via PEG tube  NSVT - EF less than 50%, continue beta blocker - EKG on 6/21 showed QTC of 510, patient received calcium gluconate 1 g IV, Kayexalate 30 g for potassium of 5.1 - Reviewed all the medications, not on any QTC prolonging medications, avoid any Haldol or fluoroquinolones. Currently asymptomatic.  COPD - No wheezing, continue supportive care   Day of Discharge BP 113/80 (BP Location: Right Arm)   Pulse 68   Temp 97.7 F (36.5 C) (Oral)   Resp 18   Ht 6\' 2"  (1.88 m)   Wt 98.5 kg (217 lb 2.5 oz)   SpO2 95%   BMI 27.88 kg/m   Physical Exam: General: Alert and awake, oriented, not in any acute distress, trach collar. HEENT:  anicteric sclera, pupils reactive to light and accommodation CVS: S1-S2 clear no murmur rubs or gallops Chest: clear to auscultation bilaterally, no wheezing rales or rhonchi Abdomen: soft nontender, nondistended, normal bowel sounds, PEG tube Extremities: no cyanosis, clubbing or edema noted bilaterally    The results of significant diagnostics from this hospitalization (including imaging, microbiology, ancillary and laboratory) are listed below for reference.    LAB RESULTS: Basic Metabolic Panel:  Recent Labs Lab 04/11/17 0730 04/12/17 0525 04/13/17 1149  NA  --  138 138  K  --  4.4 5.1  CL  --  107 103  CO2  --  22 26  GLUCOSE  --  134* 137*  BUN  --  55* 59*  CREATININE  --  1.68* 1.65*  CALCIUM  --  9.4 9.6  MG 2.0  --   --    Liver Function Tests:  Recent Labs Lab 04/08/17 0429 04/09/17 0545  AST 34 27  ALT 26 21  ALKPHOS 105 129*  BILITOT 0.8 0.6  PROT 7.5 7.1  ALBUMIN 2.0* 1.8*   No results for input(s): LIPASE, AMYLASE in the last 168 hours. No results for input(s): AMMONIA in the last 168 hours. CBC:  Recent Labs Lab 04/07/17 1243  04/12/17 0525 04/13/17 0501  WBC 25.3*  < > 11.7* 10.6*  NEUTROABS 13.9*  --   --   --   HGB 4.6*  < > 12.3* 12.8*  HCT 14.7*  < > 39.4 40.6  MCV 101.4*  < > 97.8 97.6  PLT 389  < > 199 196  < > = values in this interval not displayed. Cardiac Enzymes: No results for input(s): CKTOTAL, CKMB, CKMBINDEX, TROPONINI in the last 168 hours. BNP: Invalid input(s): POCBNP CBG:  Recent Labs Lab 04/13/17 0850 04/13/17 1147  GLUCAP 151* 151*    Significant Diagnostic Studies:  Ct Abdomen Pelvis Wo Contrast  Result Date: 04/07/2017 CLINICAL DATA:  Anemia history of aortic aneurysm EXAM: CT ABDOMEN AND PELVIS WITHOUT CONTRAST TECHNIQUE: Multidetector CT imaging of the abdomen and pelvis was performed following the standard protocol without IV contrast. COMPARISON:  01/30/2017 FINDINGS: Lower chest: Patchy bibasilar  opacities. No pleural effusion. Cardiomegaly with coronary artery calcification. Calcifications at the ventricular apex may relate to old infarct. Hepatobiliary: No focal hepatic abnormality. Possible small stones or sludge in the gallbladder. No biliary dilatation Pancreas: Unremarkable. No pancreatic ductal dilatation or surrounding inflammatory changes. Spleen: Small splenic tissue in the left upper quadrant with adjacent surgical change or calcification. Adrenals/Urinary Tract: Adrenal glands  are within normal limits. Punctate nonobstructing stone in the mid right kidney. No hydronephrosis. The bladder is unremarkable Stomach/Bowel: Stomach is nonenlarged. There is a gastrostomy tube. No dilated small bowel. No colon wall thickening. Vascular/Lymphatic: Atherosclerotic vascular disease. No aneurysmal dilatation. No significantly enlarged lymph nodes. Reproductive: Prostate glands calcifications. Other: Ventral hernia containing mesenteric fat and small amount of bowel. No free air or free fluid Musculoskeletal: Status post left hip replacement with associated artifact. Probable old fracture left inferior pubic ramus. Old right-sided rib fractures. IMPRESSION: 1. Aortic atherosclerotic vascular disease but no aneurysmal dilatation. No evidence for retroperitoneal mass or hematoma. 2. Patchy bibasilar lung opacities, not significantly changed 3. Punctate nonobstructing stone in the mid right kidney 4. Possible small stones or sludge in the gallbladder 5. Ventral hernia containing mesenteric fat and small amount of bowel but no evidence for an obstruction Electronically Signed   By: Jasmine Pang M.D.   On: 04/07/2017 22:43   Dg Chest Port 1 View  Result Date: 04/08/2017 CLINICAL DATA:  Tracheostomy tube malfunctioning. Initial encounter. EXAM: PORTABLE CHEST 1 VIEW COMPARISON:  Chest radiograph performed earlier today at 6:44 a.m. FINDINGS: The patient's tracheostomy tube is seen ending 7-8 cm above the  carina. It is grossly stable in appearance. Patchy bibasilar airspace opacities raise concern for multifocal pneumonia, redistributed from the prior study, though interstitial edema might have a similar appearance. Underlying vascular congestion is noted. No pleural effusion or pneumothorax is seen, though the left costophrenic angle is incompletely imaged on this study. The cardiomediastinal silhouette is borderline enlarged. No acute osseous abnormalities are seen. There is chronic deformity of the right mid clavicle. IMPRESSION: 1. Tracheostomy tube seen ending 7-8 cm above the carina. It is grossly stable in appearance. 2. Patchy bibasilar airspace opacities raise concern for multifocal pneumonia, redistributed from the recent prior study, though interstitial edema might have a similar appearance. 3. Underlying vascular congestion and borderline cardiomegaly. Electronically Signed   By: [name] M.D.   On: 04/08/2017 21:37   Dg Chest Port 1 View  Result Date: 04/08/2017 CLINICAL DATA:  Sepsis EXAM: PORTABLE CHEST 1 VIEW COMPARISON:  04/07/2017 FINDINGS: Left lower lobe airspace disease unchanged. Mild right upper lobe airspace disease has progressed. Negative for effusion. Tracheostomy remains in good position. IMPRESSION: Left lower lobe airspace disease unchanged. Progression of mild right upper lobe airspace disease. Possible pneumonia. Electronically Signed   By: Marlan Palau M.D.   On: 04/08/2017 09:34   Dg Chest Port 1 View  Result Date: 04/07/2017 CLINICAL DATA:  Short of breath fever EXAM: PORTABLE CHEST 1 VIEW COMPARISON:  03/27/2017 FINDINGS: Tracheostomy remains in good position. Bibasilar airspace disease is mildly improved. Negative for heart failure or effusion. IMPRESSION: Partial clearing of bibasilar atelectasis/infiltrate. Electronically Signed   By: Marlan Palau M.D.   On: 04/07/2017 13:01    2D ECHO:   Disposition and Follow-up:    DISPOSITION: Skilled nursing  facility   DISCHARGE FOLLOW-UP  Contact information for follow-up providers    [facility] Follow up.   Specialty:  Skilled Nursing Facility Contact information: [address] [place] [address] [phone]            Contact information for after-discharge care    Destination    [facility] .   Specialty:  Skilled Nursing Chief of Staff information: [id number] [place] [address] [phone]  Time spent on Discharge: 37 minutes  Signed:   Thad Ranger M.D. [facility] 04/13/2017, 12:47 PM Pager: [phone]

## 2017-04-13 NOTE — Discharge Instructions (Addendum)

## 2017-04-13 NOTE — ED Provider Notes (Signed)
[facility]-EMERGENCY DEPT Provider Note   CSN: [id number] Arrival date & time: 04/13/17  2141     History   Chief Complaint Chief Complaint  Patient presents with  . Tracheostomy Pulled out by patient    HPI Curtis Keller is a 61 y.o. male history of chronic respiratory failure from heart failure and recurrent pneumonia, here presenting with trach dislodged. Patient was admitted to the hospital and just got discharged today for pneumonia. Patient went to a nursing home and pulled out his own trach as well as pulled out the condom catheter. He is here for trach replacement.    The history is provided by the patient. The history is limited by the condition of the patient.   Level V caveat- dementia, condition of patient   Past Medical History:  Diagnosis Date  . Acute respiratory failure (HCC)    a. Spring 2017 following fall/splenectomy -->admitted to [facility]-->trach/g tube.  . Anemia    a. 12/2015 ABL in setting of fall/hematomas/splenic laceration req splenectomy.  Marland Kitchen Anxiety   . Apical mural thrombus    a. 11/2015 Echo: EF 25-30%, moderate size apical thrombus-->coumadin;  b. 12/2015 f/u Echo: EF 25-30%, ant/septa HK, no obvious large thrombus but cannot exclude small mural thrombus.  Marland Kitchen CAD (coronary artery disease)    a. 12/2014 s/p BMS to the RCA.  Marland Kitchen Cerebral infarction (HCC)   . Chronic bronchitis (HCC)    "probably"/sister 04/07/2017  . Chronic systolic CHF (congestive heart failure) (HCC)    a. 12/2015 Echo: EF 25-30%.  . Dementia   . Depression   . Dysphagia   . Falls    a. 11/2015 traumatic fall with resultant trauma req splenectomy and prolonged hospitalization complicated by resp failure.  . Hepatitis C    "never treated" (04/07/2017)  . History of blood transfusion 12/2015; 04/07/2017   "related to spleen OR; related to low HgB"   . History of gout   . Hyperlipidemia   . Hypertension   . Ischemic cardiomyopathy    a. 12/2015 Echo: EF 25-30%, anterior and  septal HK.  Marland Kitchen Left hemiplegia (HCC)   . Myocardial infarction (HCC) 1999; ?2010  . On home oxygen therapy    "28% at the SNF" (6/15/2018Z)  . Pneumonia    "several times since 12/19/2015" (6/15/20180)  . Protein calorie malnutrition (HCC)   . Respiratory failure (HCC)   . Spleen injury   . Status post tracheostomy (HCC)    a. 01/2016 in setting of ongoing resp failure and aspiration.  . Stroke (HCC) 12/19/2015   "left side paralyzed since" (04/07/2017)  . Type II diabetes mellitus [facility])     Patient Active Problem List   Diagnosis Date Noted  . Sepsis (HCC) 04/07/2017  . Acute on chronic respiratory failure with hypoxia (HCC)   . Tracheostomy status (HCC)   . SOB (shortness of breath)   . Hypoxia   . Pressure injury of skin 03/20/2017  . HCAP (healthcare-associated pneumonia) 03/18/2017  . Pneumonia 03/17/2017  . 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 Depression with anxiety 01/23/2017  . Encounter for hospice care discussion   . DNR (do not resuscitate)   . Ischemic cardiomyopathy   . Goals of care, counseling/discussion   . Palliative care encounter   . Acute systolic congestive heart failure (HCC)   . Acute and chronic respiratory failure with hypoxia (HCC) 11/28/2016  . Hip fracture (HCC) 11/27/2016  . Closed nondisplaced intertrochanteric fracture  of left femur (HCC) 11/27/2016  . Chronic respiratory failure (HCC) 11/27/2016  . Fall at nursing home 07/17/2016  . Diabetes mellitus, type II, insulin dependent (HCC) 06/12/2016  . GERD (gastroesophageal reflux disease) 05/07/2016  . COPD (chronic obstructive pulmonary disease) (HCC) 04/18/2016  . Chronic anticoagulation 03/22/2016  . S/P splenectomy 03/13/2016  . PEG (percutaneous endoscopic gastrostomy) status (HCC) 03/13/2016  . MRSA (methicillin resistant Staphylococcus aureus) septicemia (HCC) 03/13/2016  . Aspiration pneumonia (HCC) 03/13/2016  . Dysphagia 03/13/2016  . Acute blood loss  anemia 03/13/2016  . Hyperlipidemia 03/13/2016  . Heart failure, chronic systolic (HCC)   . Hypertension   . Depression   . Status post tracheostomy (HCC)   . Cardiomyopathy (HCC)   . Apical mural thrombus   . Protein calorie malnutrition (HCC)   . Intraparenchymal hematoma of brain due to trauma [facility])     Past Surgical History:  Procedure Laterality Date  . ABDOMINAL HERNIA REPAIR    . CARDIAC CATHETERIZATION    . CORONARY ANGIOPLASTY WITH STENT PLACEMENT  1999   "[facility]"  . HERNIA REPAIR    . HIP ARTHROPLASTY Left 02/10/2017   Procedure: LEFT HIP HEMIARTHROPLASTY;  Surgeon: [name], MD;  Location: [facility] OR;  Service: Orthopedics;  Laterality: Left;  . IR GASTROSTOMY TUBE MOD SED  02/02/2017  . IR GENERIC HISTORICAL  07/22/2016   IR GASTROSTOMY TUBE REMOVAL 07/22/2016 [name], MD [facility]-INTERV RAD  . PR LAP, SPLENECTOMY  01/07/2016  . TRACHEOSTOMY TUBE PLACEMENT N/A 02/22/2016   Procedure: TRACHEOSTOMY;  Surgeon: [name], MD;  Location: [facility] OR;  Service: ENT;  Laterality: N/A;  . TRACHEOSTOMY TUBE PLACEMENT N/A 01/14/2017   Procedure: TRACHEOSTOMY;  Surgeon: [name], MD;  Location: [facility] OR;  Service: ENT;  Laterality: N/A;  inserted #6DCT [id number]       Home Medications    Prior to Admission medications   Medication Sig Start Date End Date Taking? Authorizing Provider  acetylcysteine (MUCOMYST) 20 % nebulizer solution Take 4 mLs by nebulization 2 (two) times daily. 04/13/17   Rai, Ripudeep Kirtland Bouchard, MD  albuterol (PROVENTIL) (2.5 MG/3ML) 0.083% nebulizer solution Take 3 mLs (2.5 mg total) by nebulization every 4 (four) hours as needed for wheezing or shortness of breath. 04/13/17   Rai, Ripudeep K, MD  Amino Acids-Protein Hydrolys (FEEDING SUPPLEMENT, PRO-STAT SUGAR FREE 64,) LIQD Place 60 mLs into feeding tube 2 (two) times daily.     [provider]  amoxicillin-clavulanate (AUGMENTIN) 400-57 MG/5ML suspension Place 10 mLs (800 mg total) into  feeding tube 2 (two) times daily. X 7 more days 04/13/17   Rai, Delene Ruffini, MD  atorvastatin (LIPITOR) 40 MG tablet Place 40 mg into feeding tube at bedtime. for Lipids    [provider]  bisacodyl (DULCOLAX) 10 MG suppository Place 1 suppository (10 mg total) rectally daily as needed for moderate constipation. 04/13/17   Rai, Ripudeep K, MD  enoxaparin (LOVENOX) 100 MG/ML injection Inject 1 mL (100 mg total) into the skin 2 (two) times daily. Continue lovenox bridge until INR therapeutic stable between 2-3, then off 04/13/17   Rai, Ripudeep K, MD  famotidine (PEPCID) 20 MG tablet Place 20 mg into feeding tube 2 (two) times daily. for GERD    [provider]  folic acid (FOLVITE) 1 MG tablet Place 1 mg into feeding tube daily.     [provider]  guaiFENesin 200 MG tablet Place 200 mg into feeding tube 3 (three) times daily. for CHF  [provider]  ipratropium-albuterol (DUONEB) 0.5-2.5 (3) MG/3ML SOLN Take 3 mLs by nebulization 4 (four) times daily. 04/13/17   Rai, Ripudeep K, MD  lidocaine (LIDODERM) 5 % Place 2 patches onto the skin See admin instructions. Apply 2 patches to left knee every morning -remove & discard patch within 12 hours or as directed by MD 04/13/17   Rai, Delene Ruffini, MD  metoprolol tartrate (LOPRESSOR) 25 MG tablet Place 25 mg into feeding tube 2 (two) times daily.    [provider]  Multiple Vitamin (MULTIVITAMIN WITH MINERALS) TABS tablet Place 1 tablet into feeding tube daily.    [provider]  Nutritional Supplements (FEEDING SUPPLEMENT, OSMOLITE 1.5 CAL,) LIQD Place into feeding tube See admin instructions. 80 cc/hr 4p to 10a per order dated 04/06/17    [provider]  OLANZapine (ZYPREXA) 5 MG tablet Place 5 mg into feeding tube 2 (two) times daily.     [provider]  Omega-3 Fatty Acids (FISH OIL) 1000 MG CAPS Place 1,000 mg into feeding tube daily.    [provider]  OXYGEN See admin  instructions. Oxygen at 28% via trach cuff every shift    [provider]  polyethylene glycol (MIRALAX / GLYCOLAX) packet Place 17 g into feeding tube daily. for constipation    [provider]  senna (SENOKOT) 8.6 MG TABS tablet Place 1 tablet (8.6 mg total) into feeding tube daily as needed for mild constipation. For constipation 04/13/17   Rai, Delene Ruffini, MD  tamsulosin (FLOMAX) 0.4 MG CAPS capsule 0.4 mg daily at 6 PM. Give 1 capsule (0.4 mg) via PEG-Tube one time daily    [provider]  vitamin B-12 (CYANOCOBALAMIN) 1000 MCG tablet Take 1,000 mcg by mouth daily.    [provider]  vitamin B-12 1000 MCG tablet Place 1 tablet (1,000 mcg total) into feeding tube daily. 04/14/17   Rai, Delene Ruffini, MD  warfarin (COUMADIN) 6 MG tablet Place 6 mg into feeding tube daily at 6 PM.     [provider]  Water For Irrigation, Sterile (FREE WATER) SOLN Place 150 mLs into feeding tube every 4 (four) hours. 03/30/17   Richarda Overlie, MD    Family History Family History  Problem Relation Age of Onset  . Alcohol abuse Mother   . Hypertension Mother   . Coronary artery disease Mother   . Alcohol abuse Father   . Hypertension Father   . Coronary artery disease Father   . Alcohol abuse Maternal Uncle     Social History Social History  Substance Use Topics  . Smoking status: Former Smoker    Packs/day: 2.00    Years: 38.00    Types: Cigarettes  . Smokeless tobacco: Never Used     Comment: quit spring of 2017.  Marland Kitchen Alcohol use No     Allergies   Codeine; Lisinopril; Hydrocodone; Hydrocodone-acetaminophen; Tegaderm ag mesh [silver]; and Tylenol [acetaminophen]   Review of Systems Review of Systems  Respiratory: Positive for cough and shortness of breath.   All other systems reviewed and are negative.    Physical Exam Updated Vital Signs BP (!) 120/92   Pulse 90   Temp 97.8 F (36.6 C) (Rectal)   Resp (!) 21   SpO2 96%   Physical Exam    Constitutional:  Chronically ill   HENT:  Head: Normocephalic.  Mouth/Throat: Oropharynx is clear and moist.  Eyes: EOM are normal. Pupils are equal, round, and reactive to light.  Neck:  Trach site with trach dislodged   Cardiovascular: Normal rate, regular rhythm and normal heart sounds.   Pulmonary/Chest:  Diminished bilateral bases   Abdominal: Soft. Bowel sounds are normal. He exhibits no distension. There is no tenderness.  Musculoskeletal: Normal range of motion.  Neurological: He is alert.  Skin: Skin is warm.  Psychiatric: He has a normal mood and affect.  Nursing note and vitals reviewed.    ED Treatments / Results  Labs (all labs ordered are listed, but only abnormal results are displayed) Labs Reviewed  CBC WITH DIFFERENTIAL/PLATELET - Abnormal; Notable for the following:       Result Value   WBC 11.2 (*)    Lymphs Abs 4.7 (*)    Monocytes Absolute 1.1 (*)    Eosinophils Absolute 0.9 (*)    All other components within normal limits  COMPREHENSIVE METABOLIC PANEL - Abnormal; Notable for the following:    Glucose, Bld 119 (*)    BUN 55 (*)    Creatinine, Ser 1.73 (*)    Albumin 2.3 (*)    AST 47 (*)    Alkaline Phosphatase 176 (*)    GFR calc non Af Amer 41 (*)    GFR calc Af Amer 47 (*)    All other components within normal limits  PROTIME-INR - Abnormal; Notable for the following:    Prothrombin Time 16.3 (*)    All other components within normal limits    EKG  EKG Interpretation None       Radiology Dg Chest Port 1 View  Result Date: 04/13/2017 CLINICAL DATA:  Cough and self removal of tracheostomy EXAM: PORTABLE CHEST 1 VIEW COMPARISON:  Chest radiograph 04/10/2017 FINDINGS: Tracheostomy tube is no longer present. Cardiomegaly is unchanged. There is mild pulmonary edema, unchanged from the 04/10/2017 radiograph. No pneumothorax or pleural effusion. IMPRESSION: 1. Removal of tracheostomy tube. 2. Cardiomegaly and unchanged mild pulmonary edema.  Electronically Signed   By: Deatra Robinson M.D.   On: 04/13/2017 22:11    Procedures Procedures (including critical care time)  Medications Ordered in ED Medications - No data to display   Initial Impression / Assessment and Plan / ED Course  I have reviewed the triage vital signs and the nursing notes.  Pertinent labs & imaging results that were available during my care of the patient were reviewed by me and considered in my medical decision making (see chart for details).     ADVITH MARTINE is a 61 y.o. male here after he pulled out trach. I attempted to replace his trach with size 4 trach but was unsuccessful. I called Dr. Ezzard Standing, who was able to replace the trach. He also sutured the trach in and states that there is no need for repeat xray to confirm placement. WBC 11. CXR showed no new pneumonia. Chemistry stable. Nurse replaced condom cath. Stable to return to nursing home. Will continue current meds.    Final Clinical Impressions(s) / ED Diagnoses   Final diagnoses:  Other tracheostomy complication [facility])    New Prescriptions New Prescriptions   No medications on file     Charlynne Pander, MD 04/14/17 [phone]

## 2017-04-13 NOTE — Consult Note (Signed)
Reason for Consult:Replace trach Referring Physician: er   Curtis Keller is an 61 y.o. male.  HPI: patient removed trach and it couldn't be replaced  Past Medical History:  Diagnosis Date  . Acute respiratory failure (HCC)    a. Spring 2017 following fall/splenectomy -->admitted to [facility]-->trach/g tube.  . Anemia    a. 12/2015 ABL in setting of fall/hematomas/splenic laceration req splenectomy.  Marland Kitchen Anxiety   . Apical mural thrombus    a. 11/2015 Echo: EF 25-30%, moderate size apical thrombus-->coumadin;  b. 12/2015 f/u Echo: EF 25-30%, ant/septa HK, no obvious large thrombus but cannot exclude small mural thrombus.  Marland Kitchen CAD (coronary artery disease)    a. 12/2014 s/p BMS to the RCA.  Marland Kitchen Cerebral infarction (HCC)   . Chronic bronchitis (HCC)    "probably"/sister 04/07/2017  . Chronic systolic CHF (congestive heart failure) (HCC)    a. 12/2015 Echo: EF 25-30%.  . Dementia   . Depression   . Dysphagia   . Falls    a. 11/2015 traumatic fall with resultant trauma req splenectomy and prolonged hospitalization complicated by resp failure.  . Hepatitis C    "never treated" (04/07/2017)  . History of blood transfusion 12/2015; 04/07/2017   "related to spleen OR; related to low HgB"   . History of gout   . Hyperlipidemia   . Hypertension   . Ischemic cardiomyopathy    a. 12/2015 Echo: EF 25-30%, anterior and septal HK.  Marland Kitchen Left hemiplegia (HCC)   . Myocardial infarction (HCC) 1999; ?2010  . On home oxygen therapy    "28% at the SNF" (6/15/2018Z)  . Pneumonia    "several times since 12/19/2015" (6/15/20180)  . Protein calorie malnutrition (HCC)   . Respiratory failure (HCC)   . Spleen injury   . Status post tracheostomy (HCC)    a. 01/2016 in setting of ongoing resp failure and aspiration.  . Stroke (HCC) 12/19/2015   "left side paralyzed since" (04/07/2017)  . Type II diabetes mellitus (HCC)     Past Surgical History:  Procedure Laterality Date  . ABDOMINAL HERNIA REPAIR     . CARDIAC CATHETERIZATION    . CORONARY ANGIOPLASTY WITH STENT PLACEMENT  1999   "[facility]"  . HERNIA REPAIR    . HIP ARTHROPLASTY Left 02/10/2017   Procedure: LEFT HIP HEMIARTHROPLASTY;  Surgeon: [name], MD;  Location: [facility] OR;  Service: Orthopedics;  Laterality: Left;  . IR GASTROSTOMY TUBE MOD SED  02/02/2017  . IR GENERIC HISTORICAL  07/22/2016   IR GASTROSTOMY TUBE REMOVAL 07/22/2016 [name], MD [facility]-INTERV RAD  . PR LAP, SPLENECTOMY  01/07/2016  . TRACHEOSTOMY TUBE PLACEMENT N/A 02/22/2016   Procedure: TRACHEOSTOMY;  Surgeon: [name], MD;  Location: [facility] OR;  Service: ENT;  Laterality: N/A;  . TRACHEOSTOMY TUBE PLACEMENT N/A 01/14/2017   Procedure: TRACHEOSTOMY;  Surgeon: [name], MD;  Location: [facility] OR;  Service: ENT;  Laterality: N/A;  inserted #6DCT EZM#[id number]    Social History:  reports that he has quit smoking. His smoking use included Cigarettes. He has a 76.00 pack-year smoking history. He has never used smokeless tobacco. He reports that he does not drink alcohol or use drugs.  Allergies:  Allergies  Allergen Reactions  . Codeine Anaphylaxis and Nausea And Vomiting  . Lisinopril Itching and Rash  . Hydrocodone Other (See Comments)    On MAR  . Hydrocodone-Acetaminophen Nausea And Vomiting    Cannot take any acetaminophen due to liver issues  per sister  . Tegaderm Ag Mesh [Silver] Other (See Comments)    Allergy to "silver compounds" listed on MAR  . Tylenol [Acetaminophen] Nausea Only and Other (See Comments)    Cannot take any acetaminophen due to liver issues per sister    Medications: I have reviewed the patient's current medications.  Results for orders placed or performed during the hospital encounter of 04/13/17 (from the past 48 hour(s))  CBC with Differential/Platelet     Status: Abnormal   Collection Time: 04/13/17 10:55 PM  Result Value Ref Range   WBC 11.2 (H) 4.0 - 10.5 K/uL   RBC 4.38 4.22 - 5.81 MIL/uL   Hemoglobin 13.8  13.0 - 17.0 g/dL   HCT 16.1 09.6 - 04.5 %   MCV 97.0 78.0 - 100.0 fL   MCH 31.5 26.0 - 34.0 pg   MCHC 32.5 30.0 - 36.0 g/dL   RDW 40.9 81.1 - 91.4 %   Platelets 226 150 - 400 K/uL   Neutrophils Relative % 39 %   Neutro Abs 4.4 1.7 - 7.7 K/uL   Lymphocytes Relative 42 %   Lymphs Abs 4.7 (H) 0.7 - 4.0 K/uL   Monocytes Relative 10 %   Monocytes Absolute 1.1 (H) 0.1 - 1.0 K/uL   Eosinophils Relative 8 %   Eosinophils Absolute 0.9 (H) 0.0 - 0.7 K/uL   Basophils Relative 1 %   Basophils Absolute 0.1 0.0 - 0.1 K/uL    Dg Chest Port 1 View  Result Date: 04/13/2017 CLINICAL DATA:  Cough and self removal of tracheostomy EXAM: PORTABLE CHEST 1 VIEW COMPARISON:  Chest radiograph 04/10/2017 FINDINGS: Tracheostomy tube is no longer present. Cardiomegaly is unchanged. There is mild pulmonary edema, unchanged from the 04/10/2017 radiograph. No pneumothorax or pleural effusion. IMPRESSION: 1. Removal of tracheostomy tube. 2. Cardiomegaly and unchanged mild pulmonary edema. Electronically Signed   By: Deatra Robinson M.D.   On: 04/13/2017 22:11    ROS:neg   NW:GNFAO out No distress  Assessment/Plan: # 4 Shiley replaced without difficulty and secured with 3.0 silk sutures x 4 and tach collar  CHRISTOPHER NEWMAN 04/13/2017, 11:41 PM

## 2017-04-13 NOTE — Care Management Note (Signed)
Case Management Note  Patient Details  Name: Curtis Keller MRN: [id number] Date of Birth: 02-12-1956  Subjective/Objective:                    Action/Plan: Pt discharging back to [facility] today. No further needs per CM.   Expected Discharge Date:  04/13/17               Expected Discharge Plan:  Skilled Nursing Facility  In-House Referral:  Clinical Social Work  Discharge planning Services     Post Acute Care Choice:    Choice offered to:     DME Arranged:    DME Agency:     HH Arranged:    HH Agency:     Status of Service:  Completed, signed off  If discussed at [employer], dates discussed:    Additional Comments:  Kermit Balo, RN 04/13/2017, 2:41 PM

## 2017-04-13 NOTE — Telephone Encounter (Signed)
Attempted to contact Curtis Keller. No answer and I could not leave a message. Will try back. We can schedule with MW on 04/20/2017.

## 2017-04-14 DIAGNOSIS — F329 Major depressive disorder, single episode, unspecified: Secondary | ICD-10-CM | POA: Diagnosis not present

## 2017-04-14 DIAGNOSIS — E46 Unspecified protein-calorie malnutrition: Secondary | ICD-10-CM | POA: Diagnosis not present

## 2017-04-14 DIAGNOSIS — J449 Chronic obstructive pulmonary disease, unspecified: Secondary | ICD-10-CM | POA: Diagnosis not present

## 2017-04-14 LAB — COMPREHENSIVE METABOLIC PANEL
ALT: 38 U/L (ref 17–63)
AST: 47 U/L — ABNORMAL HIGH (ref 15–41)
Albumin: 2.3 g/dL — ABNORMAL LOW (ref 3.5–5.0)
Alkaline Phosphatase: 176 U/L — ABNORMAL HIGH (ref 38–126)
Anion gap: 9 (ref 5–15)
BILIRUBIN TOTAL: 0.4 mg/dL (ref 0.3–1.2)
BUN: 55 mg/dL — AB (ref 6–20)
CHLORIDE: 101 mmol/L (ref 101–111)
CO2: 29 mmol/L (ref 22–32)
CREATININE: 1.73 mg/dL — AB (ref 0.61–1.24)
Calcium: 9.9 mg/dL (ref 8.9–10.3)
GFR calc Af Amer: 47 mL/min — ABNORMAL LOW (ref >60)
GFR, EST NON AFRICAN AMERICAN: 41 mL/min — AB (ref >60)
Glucose, Bld: 119 mg/dL — ABNORMAL HIGH (ref 65–99)
Potassium: 4.3 mmol/L (ref 3.5–5.1)
Sodium: 139 mmol/L (ref 135–145)
Total Protein: 7.8 g/dL (ref 6.5–8.1)

## 2017-04-14 NOTE — Discharge Instructions (Signed)
The trach is sutured in. Please do not remove the sutures until he sees Dr. Ezzard Standing.   Continue his current meds.   See Dr. Ezzard Standing next week   Return to ER if he has fever, vomiting, increased suctioning requirement.

## 2017-04-14 NOTE — Telephone Encounter (Signed)
Called [employer] and spoke to [employer]. Elease Hashimoto is off this week. She was unwilling to help get this pt scheduled. Stated, "I didn't call you on this pt." Will await Patricia's return next week.

## 2017-04-17 DIAGNOSIS — I509 Heart failure, unspecified: Secondary | ICD-10-CM | POA: Diagnosis not present

## 2017-04-17 DIAGNOSIS — E46 Unspecified protein-calorie malnutrition: Secondary | ICD-10-CM | POA: Diagnosis not present

## 2017-04-17 DIAGNOSIS — R092 Respiratory arrest: Secondary | ICD-10-CM | POA: Diagnosis not present

## 2017-04-17 NOTE — Telephone Encounter (Signed)
Waiting for Curtis Keller to call back about scheduling this appt for this pt

## 2017-04-18 DIAGNOSIS — G8114 Spastic hemiplegia affecting left nondominant side: Secondary | ICD-10-CM | POA: Diagnosis not present

## 2017-04-18 NOTE — Telephone Encounter (Signed)
lmomcb x 2 for Curtis Keller

## 2017-04-19 NOTE — Telephone Encounter (Signed)
lmomtcb x 3 for Curtis Keller to get the pt scheduled for HFU appt.

## 2017-04-20 NOTE — Telephone Encounter (Signed)
We have attempted to contact Curtis Keller several time with no call back. Per triage protocol, message will be closed.

## 2017-04-24 DIAGNOSIS — G8114 Spastic hemiplegia affecting left nondominant side: Secondary | ICD-10-CM | POA: Diagnosis not present

## 2017-05-01 ENCOUNTER — Ambulatory Visit (INDEPENDENT_AMBULATORY_CARE_PROVIDER_SITE_OTHER): Payer: Medicare Other | Admitting: Adult Health

## 2017-05-01 ENCOUNTER — Encounter: Payer: Self-pay | Admitting: Adult Health

## 2017-05-01 DIAGNOSIS — J9611 Chronic respiratory failure with hypoxia: Secondary | ICD-10-CM

## 2017-05-01 DIAGNOSIS — J690 Pneumonitis due to inhalation of food and vomit: Secondary | ICD-10-CM

## 2017-05-01 DIAGNOSIS — J411 Mucopurulent chronic bronchitis: Secondary | ICD-10-CM

## 2017-05-01 NOTE — Assessment & Plan Note (Signed)
Trach dependent Resp Failure . Recent trach dislodgement with replacement by ENT with sutures in place since 6/21. Stoma is somewhat enlarged and pt continues to pull at trach . Will set up for trach clinic follow up . Cont w/ Oxygen with trach collar. to keep sats >88-90%.

## 2017-05-01 NOTE — Assessment & Plan Note (Signed)
Dysphagia with previous CVA prone to aspiration pneumonia .  Clinically Curtis Keller resolved  cont w Awilda Metro and Peg  cont w/ ST at SNF.

## 2017-05-01 NOTE — Patient Instructions (Signed)
Set up at [facility] clinic .  Continue trach care daily .  Continue on oxygen with trach collar.  Follow up with Dr. Jamison Neighbor in 2 months and As needed

## 2017-05-01 NOTE — Progress Notes (Signed)
@Patient  ID: Curtis Keller, male    DOB: July 30, 1956, 61 y.o.   MRN: [id number]  Chief Complaint  Patient presents with  . Follow-up    COPD /Trach     Referring provider: [facility] [place]*  HPI: 61 year old male former smoker with previous CVA in February 2017 with residual left hemiparesis and contractures, chronic systolic congestive heart failure, dementia.  Hypoxic respiratory failure with CHF 11/2016 after hip fracture with trach /peg placed  Trach  with decanulation 03/2016  05/01/2017 Follow up : Post hospital follow-up/COPD Janina Mayo  Patient presents for a post hospital follow-up. Patient has had several hospitalizations over the last 6 months. Patient was seen by pulmonary and critical care during his hospitalizations. Patient was initially admitted in February with a hip fracture. He developed congestive heart failure and hypoxic respiratory failure. And was intubated. Trach and PEG were placed . He had aspiration Pneumonia and required hospitalization in June .  Patient was discharged to a nursing home. He did go back to ER 2 weeks after trach was dislodged and was seen by ENT with #4 shiley  and sutures placed  He is accompanied by EMS and sister today on stretcher.  He has intermittent confusion. Most of info is from sister.  CXR on 6/21 with no acute process.  He remains on trach collar Sister says this adjusted intermitently . Today in office on 3l/m he was 96% on room air.  Sister has multiple complaints about his care over last year and with SNF.  Tried to answer all her questions and review pt care and several comorbidiites.  Pt is currently bed bound with full care. Limited PT . NPO with tube feeds via Peg.    Allergies  Allergen Reactions  . Codeine Anaphylaxis and Nausea And Vomiting  . Lisinopril Itching and Rash  . Hydrocodone Other (See Comments)    On MAR  . Hydrocodone-Acetaminophen Nausea And Vomiting    Cannot take any acetaminophen due to liver  issues per sister  . Tegaderm Ag Mesh [Silver] Other (See Comments)    Allergy to "silver compounds" listed on MAR  . Tylenol [Acetaminophen] Nausea Only and Other (See Comments)    Cannot take any acetaminophen due to liver issues per sister    Immunization History  Administered Date(s) Administered  . PPD Test 07/01/2016, 07/08/2016, 03/30/2017  . Pneumococcal Polysaccharide-23 08/03/2012  . Tdap 12/11/2011    Past Medical History:  Diagnosis Date  . Acute respiratory failure (HCC)    a. Spring 2017 following fall/splenectomy -->admitted to [facility]-->trach/g tube.  . Anemia    a. 12/2015 ABL in setting of fall/hematomas/splenic laceration req splenectomy.  Marland Kitchen Anxiety   . Apical mural thrombus    a. 11/2015 Echo: EF 25-30%, moderate size apical thrombus-->coumadin;  b. 12/2015 f/u Echo: EF 25-30%, ant/septa HK, no obvious large thrombus but cannot exclude small mural thrombus.  Marland Kitchen CAD (coronary artery disease)    a. 12/2014 s/p BMS to the RCA.  Marland Kitchen Cerebral infarction (HCC)   . Chronic bronchitis (HCC)    "probably"/sister 04/07/2017  . Chronic systolic CHF (congestive heart failure) (HCC)    a. 12/2015 Echo: EF 25-30%.  . Dementia   . Depression   . Dysphagia   . Falls    a. 11/2015 traumatic fall with resultant trauma req splenectomy and prolonged hospitalization complicated by resp failure.  . Hepatitis C    "never treated" (04/07/2017)  . History of blood transfusion 12/2015; 04/07/2017   "  related to spleen OR; related to low HgB"   . History of gout   . Hyperlipidemia   . Hypertension   . Ischemic cardiomyopathy    a. 12/2015 Echo: EF 25-30%, anterior and septal HK.  Marland Kitchen Left hemiplegia (HCC)   . Myocardial infarction (HCC) 1999; ?2010  . On home oxygen therapy    "28% at the SNF" (6/15/2018Z)  . Pneumonia    "several times since 12/19/2015" (6/15/20180)  . Protein calorie malnutrition (HCC)   . Respiratory failure (HCC)   . Spleen injury   . Status post  tracheostomy (HCC)    a. 01/2016 in setting of ongoing resp failure and aspiration.  . Stroke (HCC) 12/19/2015   "left side paralyzed since" (04/07/2017)  . Type II diabetes mellitus (HCC)     Tobacco History: History  Smoking Status  . Former Smoker  . Packs/day: 2.00  . Years: 38.00  . Types: Cigarettes  . Quit date: 11/25/2015  Smokeless Tobacco  . Never Used    Comment: quit spring of 2017.   Counseling given: Not Answered   Outpatient Encounter Prescriptions as of 05/01/2017  Medication Sig  . acetylcysteine (MUCOMYST) 20 % nebulizer solution Take 4 mLs by nebulization 2 (two) times daily.  Marland Kitchen albuterol (PROVENTIL) (2.5 MG/3ML) 0.083% nebulizer solution Take 3 mLs (2.5 mg total) by nebulization every 4 (four) hours as needed for wheezing or shortness of breath.  . Amino Acids-Protein Hydrolys (FEEDING SUPPLEMENT, PRO-STAT SUGAR FREE 64,) LIQD Place 60 mLs into feeding tube 2 (two) times daily.   Marland Kitchen atorvastatin (LIPITOR) 40 MG tablet Place 40 mg into feeding tube at bedtime. for Lipids  . baclofen (LIORESAL) 10 MG tablet Take 10 mg by mouth 3 (three) times daily.  . bisacodyl (DULCOLAX) 10 MG suppository Place 1 suppository (10 mg total) rectally daily as needed for moderate constipation.  . busPIRone (BUSPAR) 5 MG tablet Take 5 mg by mouth 2 (two) times daily.  Marland Kitchen enoxaparin (LOVENOX) 100 MG/ML injection Inject 1 mL (100 mg total) into the skin 2 (two) times daily. Continue lovenox bridge until INR therapeutic stable between 2-3, then off  . famotidine (PEPCID) 20 MG tablet Place 20 mg into feeding tube 2 (two) times daily. for GERD  . folic acid (FOLVITE) 1 MG tablet Place 1 mg into feeding tube daily.   Marland Kitchen guaiFENesin 200 MG tablet Place 200 mg into feeding tube 3 (three) times daily. for CHF  . ipratropium-albuterol (DUONEB) 0.5-2.5 (3) MG/3ML SOLN Take 3 mLs by nebulization 4 (four) times daily.  Marland Kitchen lidocaine (LIDODERM) 5 % Place 2 patches onto the skin See admin instructions.  Apply 2 patches to left knee every morning -remove & discard patch within 12 hours or as directed by MD  . metoprolol tartrate (LOPRESSOR) 25 MG tablet Place 25 mg into feeding tube 2 (two) times daily.  . Multiple Vitamin (MULTIVITAMIN WITH MINERALS) TABS tablet Place 1 tablet into feeding tube daily.  . Nutritional Supplements (FEEDING SUPPLEMENT, OSMOLITE 1.5 CAL,) LIQD Place into feeding tube See admin instructions. 80 cc/hr 4p to 10a per order dated 04/06/17  . OLANZapine (ZYPREXA) 5 MG tablet Place 5 mg into feeding tube 2 (two) times daily.   . Omega-3 Fatty Acids (FISH OIL) 1000 MG CAPS Place 1,000 mg into feeding tube daily.  . OXYGEN See admin instructions. Oxygen at 28% via trach cuff every shift  . polyethylene glycol (MIRALAX / GLYCOLAX) packet Place 17 g into feeding tube daily. for constipation  .  senna (SENOKOT) 8.6 MG TABS tablet Place 1 tablet (8.6 mg total) into feeding tube daily as needed for mild constipation. For constipation  . tamsulosin (FLOMAX) 0.4 MG CAPS capsule 0.4 mg daily at 6 PM. Give 1 capsule (0.4 mg) via PEG-Tube one time daily  . vitamin B-12 1000 MCG tablet Place 1 tablet (1,000 mcg total) into feeding tube daily.  Marland Kitchen warfarin (COUMADIN) 6 MG tablet Place 6 mg into feeding tube daily at 6 PM.   . Water For Irrigation, Sterile (FREE WATER) SOLN Place 150 mLs into feeding tube every 4 (four) hours.  . [DISCONTINUED] amoxicillin-clavulanate (AUGMENTIN) 400-57 MG/5ML suspension Place 10 mLs (800 mg total) into feeding tube 2 (two) times daily. X 7 more days (Patient not taking: Reported on 05/01/2017)  . [DISCONTINUED] vitamin B-12 (CYANOCOBALAMIN) 1000 MCG tablet Take 1,000 mcg by mouth daily.   No facility-administered encounter medications on file as of 05/01/2017.      Review of Systems  Constitutional:   No  weight loss, night sweats,  Fevers, chills, fatigue, or  lassitude.  HEENT:   No headaches,  Difficulty swallowing,  Tooth/dental problems, or  Sore  throat,                No sneezing, itching, ear ache, nasal congestion, post nasal drip,   CV:  No chest pain,  Orthopnea, PND, swelling in lower extremities, anasarca, dizziness, palpitations, syncope.   GI  +dysphagia , PEG   Resp:  .  No chest wall deformity  Skin: no rash or lesions.  GU: no dysuria, change in color of urine, no urgency or frequency.  No flank pain, no hematuria   MS:  No joint pain or swelling.  No decreased range of motion.  No back pain.    Physical Exam  BP 96/64 (BP Location: Right Arm, Cuff Size: Normal)   Pulse (!) 53   SpO2 96%   GEN: A/Ox3; pleasant , NAD, elderly on stretcher , chronically ill appearing on trach collar    HEENT:  [place]/AT,   THROAT-clear, no lesions, no postnasal drip or exudate noted.   NECK:  Supple w/ fair ROM; no JVD; normal carotid impulses w/o bruits; no thyromegaly or nodules palpated; no lymphadenopathy.  Trach midline with large stoma, slight drainage . Tender to touch , sutures in place.   RESP  Clear  P & A; w/o, wheezes/ rales/ or rhonchi. no accessory muscle use, no dullness to percussion  CARD:  RRR, no m/r/g, no peripheral edema, pulses intact, no cyanosis or clubbing.  GI:   Soft & nt; nml bowel sounds; no organomegaly or masses detected.   Musco: Warm bil, no deformities or joint swelling noted.   Neuro: alert, no focal deficits noted.    Skin: Warm, no lesions or rashes   ProBNP No results found for: PROBNP  Imaging:   Assessment & Plan:   Aspiration pneumonia (HCC) Dysphagia with previous CVA prone to aspiration pneumonia .  Clinically Theodosia Quay resolved  cont w Awilda Metro and Peg  cont w/ ST at SNF.   COPD (chronic obstructive pulmonary disease) (HCC) Cont on Duoneb Four times a day  .   Chronic respiratory failure (HCC) Trach dependent Resp Failure . Recent trach dislodgement with replacement by ENT with sutures in place since 6/21. Stoma is somewhat enlarged and pt continues to pull at trach . Will  set up for trach clinic follow up . Cont w/ Oxygen with trach collar. to keep sats >88-90%.  Rubye Oaks, NP 05/01/2017

## 2017-05-01 NOTE — Assessment & Plan Note (Signed)
Cont on Duoneb Four times a day  .

## 2017-05-02 NOTE — Progress Notes (Signed)
Note reviewed.  Donna Christen Jamison Neighbor, M.D. [facility] Critical [facility] Pager:  [phone] After 3pm or if no response, call [phone] 7:35 AM 05/02/17

## 2017-05-04 DIAGNOSIS — G8114 Spastic hemiplegia affecting left nondominant side: Secondary | ICD-10-CM | POA: Diagnosis not present

## 2017-05-05 ENCOUNTER — Encounter (HOSPITAL_COMMUNITY): Payer: Self-pay | Admitting: Emergency Medicine

## 2017-05-05 ENCOUNTER — Observation Stay (HOSPITAL_COMMUNITY)
Admission: EM | Admit: 2017-05-05 | Discharge: 2017-05-07 | Disposition: A | Discharge status: Skilled Nursing Facility | Payer: Medicare Other | Attending: Internal Medicine | Admitting: Internal Medicine

## 2017-05-05 DIAGNOSIS — Z430 Encounter for attention to tracheostomy: Principal | ICD-10-CM | POA: Insufficient documentation

## 2017-05-05 DIAGNOSIS — F329 Major depressive disorder, single episode, unspecified: Secondary | ICD-10-CM | POA: Diagnosis present

## 2017-05-05 DIAGNOSIS — Z9889 Other specified postprocedural states: Secondary | ICD-10-CM

## 2017-05-05 DIAGNOSIS — J690 Pneumonitis due to inhalation of food and vomit: Secondary | ICD-10-CM | POA: Diagnosis not present

## 2017-05-05 DIAGNOSIS — I10 Essential (primary) hypertension: Secondary | ICD-10-CM | POA: Diagnosis present

## 2017-05-05 DIAGNOSIS — J9500 Unspecified tracheostomy complication: Secondary | ICD-10-CM | POA: Diagnosis present

## 2017-05-05 DIAGNOSIS — I5022 Chronic systolic (congestive) heart failure: Secondary | ICD-10-CM | POA: Diagnosis not present

## 2017-05-05 DIAGNOSIS — E46 Unspecified protein-calorie malnutrition: Secondary | ICD-10-CM | POA: Diagnosis present

## 2017-05-05 DIAGNOSIS — E119 Type 2 diabetes mellitus without complications: Secondary | ICD-10-CM

## 2017-05-05 DIAGNOSIS — Z7983 Long term (current) use of bisphosphonates: Secondary | ICD-10-CM | POA: Insufficient documentation

## 2017-05-05 DIAGNOSIS — I2510 Atherosclerotic heart disease of native coronary artery without angina pectoris: Secondary | ICD-10-CM | POA: Insufficient documentation

## 2017-05-05 DIAGNOSIS — Z87891 Personal history of nicotine dependence: Secondary | ICD-10-CM | POA: Insufficient documentation

## 2017-05-05 DIAGNOSIS — E785 Hyperlipidemia, unspecified: Secondary | ICD-10-CM | POA: Diagnosis not present

## 2017-05-05 DIAGNOSIS — J9509 Other tracheostomy complication: Secondary | ICD-10-CM | POA: Diagnosis not present

## 2017-05-05 DIAGNOSIS — Z9081 Acquired absence of spleen: Secondary | ICD-10-CM

## 2017-05-05 DIAGNOSIS — F32A Depression, unspecified: Secondary | ICD-10-CM | POA: Diagnosis present

## 2017-05-05 DIAGNOSIS — I255 Ischemic cardiomyopathy: Secondary | ICD-10-CM | POA: Diagnosis present

## 2017-05-05 DIAGNOSIS — Z22322 Carrier or suspected carrier of Methicillin resistant Staphylococcus aureus: Secondary | ICD-10-CM | POA: Insufficient documentation

## 2017-05-05 DIAGNOSIS — I69359 Hemiplegia and hemiparesis following cerebral infarction affecting unspecified side: Secondary | ICD-10-CM | POA: Insufficient documentation

## 2017-05-05 DIAGNOSIS — I252 Old myocardial infarction: Secondary | ICD-10-CM | POA: Insufficient documentation

## 2017-05-05 DIAGNOSIS — F0390 Unspecified dementia without behavioral disturbance: Secondary | ICD-10-CM | POA: Insufficient documentation

## 2017-05-05 DIAGNOSIS — Z930 Tracheostomy status: Secondary | ICD-10-CM

## 2017-05-05 DIAGNOSIS — B1920 Unspecified viral hepatitis C without hepatic coma: Secondary | ICD-10-CM | POA: Insufficient documentation

## 2017-05-05 DIAGNOSIS — Z9981 Dependence on supplemental oxygen: Secondary | ICD-10-CM | POA: Insufficient documentation

## 2017-05-05 DIAGNOSIS — Z79899 Other long term (current) drug therapy: Secondary | ICD-10-CM | POA: Insufficient documentation

## 2017-05-05 DIAGNOSIS — N183 Chronic kidney disease, stage 3 (moderate): Secondary | ICD-10-CM | POA: Insufficient documentation

## 2017-05-05 DIAGNOSIS — I130 Hypertensive heart and chronic kidney disease with heart failure and stage 1 through stage 4 chronic kidney disease, or unspecified chronic kidney disease: Secondary | ICD-10-CM | POA: Insufficient documentation

## 2017-05-05 DIAGNOSIS — J449 Chronic obstructive pulmonary disease, unspecified: Secondary | ICD-10-CM | POA: Diagnosis present

## 2017-05-05 DIAGNOSIS — R0902 Hypoxemia: Secondary | ICD-10-CM

## 2017-05-05 DIAGNOSIS — I513 Intracardiac thrombosis, not elsewhere classified: Secondary | ICD-10-CM | POA: Diagnosis present

## 2017-05-05 DIAGNOSIS — E1122 Type 2 diabetes mellitus with diabetic chronic kidney disease: Secondary | ICD-10-CM | POA: Insufficient documentation

## 2017-05-05 DIAGNOSIS — Z931 Gastrostomy status: Secondary | ICD-10-CM

## 2017-05-05 DIAGNOSIS — Z7901 Long term (current) use of anticoagulants: Secondary | ICD-10-CM

## 2017-05-05 DIAGNOSIS — Z794 Long term (current) use of insulin: Secondary | ICD-10-CM

## 2017-05-05 NOTE — Progress Notes (Signed)
Reason for Consult: Dislodged tracheostomy tube Referring Physician: Margarita Grizzle, MD  HPI:  Curtis Keller is an 61 y.o. male who was transported to the [facility] ER today with a dislodged trach. The tracheostomy tube was placed by Dr. Narda Bonds in March of this year. This was placed due to prolonged intubation. He also has a history of CHF, CVA and multiple admissions for respiratory problems.   Patient is at [facility]. The patient has no report of any respiratory distress.  Past Medical History:  Diagnosis Date  . Acute respiratory failure (HCC)    a. Spring 2017 following fall/splenectomy -->admitted to [facility]-->trach/g tube.  . Anemia    a. 12/2015 ABL in setting of fall/hematomas/splenic laceration req splenectomy.  Marland Kitchen Anxiety   . Apical mural thrombus    a. 11/2015 Echo: EF 25-30%, moderate size apical thrombus-->coumadin;  b. 12/2015 f/u Echo: EF 25-30%, ant/septa HK, no obvious large thrombus but cannot exclude small mural thrombus.  Marland Kitchen CAD (coronary artery disease)    a. 12/2014 s/p BMS to the RCA.  Marland Kitchen Cerebral infarction (HCC)   . Chronic bronchitis (HCC)    "probably"/sister 04/07/2017  . Chronic systolic CHF (congestive heart failure) (HCC)    a. 12/2015 Echo: EF 25-30%.  . Dementia   . Depression   . Dysphagia   . Falls    a. 11/2015 traumatic fall with resultant trauma req splenectomy and prolonged hospitalization complicated by resp failure.  . Hepatitis C    "never treated" (04/07/2017)  . History of blood transfusion 12/2015; 04/07/2017   "related to spleen OR; related to low HgB"   . History of gout   . Hyperlipidemia   . Hypertension   . Ischemic cardiomyopathy    a. 12/2015 Echo: EF 25-30%, anterior and septal HK.  Marland Kitchen Left hemiplegia (HCC)   . Myocardial infarction (HCC) 1999; ?2010  . On home oxygen therapy    "28% at the SNF" (6/15/2018Z)  . Pneumonia    "several times since 12/19/2015" (6/15/20180)  . Protein calorie malnutrition (HCC)    . Respiratory failure (HCC)   . Spleen injury   . Status post tracheostomy (HCC)    a. 01/2016 in setting of ongoing resp failure and aspiration.  . Stroke (HCC) 12/19/2015   "left side paralyzed since" (04/07/2017)  . Type II diabetes mellitus (HCC)     Past Surgical History:  Procedure Laterality Date  . ABDOMINAL HERNIA REPAIR    . CARDIAC CATHETERIZATION    . CORONARY ANGIOPLASTY WITH STENT PLACEMENT  1999   "[place]"  . HERNIA REPAIR    . HIP ARTHROPLASTY Left 02/10/2017   Procedure: LEFT HIP HEMIARTHROPLASTY;  Surgeon: [name], MD;  Location: [facility] OR;  Service: Orthopedics;  Laterality: Left;  . IR GASTROSTOMY TUBE MOD SED  02/02/2017  . IR GENERIC HISTORICAL  07/22/2016   IR GASTROSTOMY TUBE REMOVAL 07/22/2016 [name], MD [facility]-INTERV RAD  . PR LAP, SPLENECTOMY  01/07/2016  . TRACHEOSTOMY TUBE PLACEMENT N/A 02/22/2016   Procedure: TRACHEOSTOMY;  Surgeon: [name], MD;  Location: [facility] OR;  Service: ENT;  Laterality: N/A;  . TRACHEOSTOMY TUBE PLACEMENT N/A 01/14/2017   Procedure: TRACHEOSTOMY;  Surgeon: [name], MD;  Location: [facility] OR;  Service: ENT;  Laterality: N/A;  inserted #6DCT [id number]    Family History  Problem Relation Age of Onset  . Alcohol abuse Mother   . Hypertension Mother   . Coronary artery disease Mother   .  Alcohol abuse Father   . Hypertension Father   . Coronary artery disease Father   . Alcohol abuse Maternal Uncle     Social History:  reports that he quit smoking about 17 months ago. His smoking use included Cigarettes. He has a 76.00 pack-year smoking history. He has never used smokeless tobacco. He reports that he does not drink alcohol or use drugs.  Allergies:  Allergies  Allergen Reactions  . Codeine Anaphylaxis and Nausea And Vomiting  . Lisinopril Itching and Rash  . Hydrocodone Other (See Comments)    On MAR  . Hydrocodone-Acetaminophen Nausea And Vomiting    Cannot take any acetaminophen due to liver issues  per sister  . Tegaderm Ag Mesh [Silver] Other (See Comments)    Allergy to "silver compounds" listed on MAR  . Tylenol [Acetaminophen] Nausea Only and Other (See Comments)    Cannot take any acetaminophen due to liver issues per sister    Prior to Admission medications   Medication Sig Start Date End Date Taking? Authorizing Provider  acetylcysteine (MUCOMYST) 20 % nebulizer solution Take 4 mLs by nebulization 2 (two) times daily. 04/13/17  Yes Rai, Ripudeep K, MD  Amino Acids-Protein Hydrolys (FEEDING SUPPLEMENT, PRO-STAT SUGAR FREE 64,) LIQD Place 60 mLs into feeding tube 2 (two) times daily.    Yes [provider]  atorvastatin (LIPITOR) 40 MG tablet Place 40 mg into feeding tube at bedtime. for Lipids   Yes [provider]  baclofen (LIORESAL) 10 MG tablet Take 5-10 mg by mouth See admin instructions. Take 5 mg to 10 mg three times a day for spasms   Yes [provider]  busPIRone (BUSPAR) 5 MG tablet Take 5 mg by mouth 2 (two) times daily.   Yes [provider]  enoxaparin (LOVENOX) 100 MG/ML injection Inject 1 mL (100 mg total) into the skin 2 (two) times daily. Continue lovenox bridge until INR therapeutic stable between 2-3, then off 04/13/17  Yes Rai, Ripudeep K, MD  famotidine (PEPCID) 20 MG tablet Place 20 mg into feeding tube 2 (two) times daily. for GERD   Yes [provider]  folic acid (FOLVITE) 1 MG tablet Place 1 mg into feeding tube daily.    Yes [provider]  guaiFENesin 200 MG tablet Place 200 mg into feeding tube 3 (three) times daily. for CHF   Yes [provider]  ipratropium-albuterol (DUONEB) 0.5-2.5 (3) MG/3ML SOLN Take 3 mLs by nebulization 4 (four) times daily. 04/13/17  Yes Rai, Ripudeep K, MD  lidocaine (LIDODERM) 5 % Place 2 patches onto the skin See admin instructions. Apply 2 patches to left knee every morning -remove & discard patch within 12 hours or as directed by MD 04/13/17  Yes Rai, Ripudeep K, MD   metoprolol tartrate (LOPRESSOR) 25 MG tablet Place 25 mg into feeding tube 2 (two) times daily.   Yes [provider]  Multiple Vitamin (MULTIVITAMIN WITH MINERALS) TABS tablet Place 1 tablet into feeding tube daily.   Yes [provider]  Nutritional Supplements (FEEDING SUPPLEMENT, OSMOLITE 1.5 CAL,) LIQD Place 1,000 mLs into feeding tube See admin instructions. Run at 80 cc/hr 8 hrs via peg tube. Start at 1600 and stop  @@ 10 am two times a day related to tracheostomy status   Yes [provider]  OLANZapine (ZYPREXA) 5 MG tablet Place 5 mg into feeding tube 2 (two) times daily.    Yes [provider]  Omega-3 Fatty Acids (FISH OIL) 1000 MG  CAPS Place 1,000 mg into feeding tube daily.   Yes [provider]  polyethylene glycol (MIRALAX / GLYCOLAX) packet Place 17 g into feeding tube daily. for constipation   Yes [provider]  tamsulosin (FLOMAX) 0.4 MG CAPS capsule 0.4 mg daily at 6 PM. Give 1 capsule (0.4 mg) via PEG-Tube one time daily   Yes [provider]  vitamin B-12 1000 MCG tablet Place 1 tablet (1,000 mcg total) into feeding tube daily. 04/14/17  Yes Rai, Ripudeep K, MD  warfarin (COUMADIN) 6 MG tablet Place 1-6 mg into feeding tube daily at 6 PM.    Yes [provider]  albuterol (PROVENTIL) (2.5 MG/3ML) 0.083% nebulizer solution Take 3 mLs (2.5 mg total) by nebulization every 4 (four) hours as needed for wheezing or shortness of breath. 04/13/17   Rai, Ripudeep K, MD  bisacodyl (DULCOLAX) 10 MG suppository Place 1 suppository (10 mg total) rectally daily as needed for moderate constipation. 04/13/17   Cathren Harsh, MD  OXYGEN See admin instructions. Oxygen at 28% via trach cuff every shift    [provider]  senna (SENOKOT) 8.6 MG TABS tablet Place 1 tablet (8.6 mg total) into feeding tube daily as needed for mild constipation. For constipation 04/13/17   Rai, Delene Ruffini, MD  Water For Irrigation, Sterile  (FREE WATER) SOLN Place 150 mLs into feeding tube every 4 (four) hours. 03/30/17   Richarda Overlie, MD    No results found for this or any previous visit (from the past 48 hour(s)).  No results found.  Review of Systems  Unable to perform ROS: Dementia   Blood pressure 94/65, pulse 68, temperature 98.4 F (36.9 C), temperature source Oral, resp. rate 11, height 6\' 2"  (1.88 m), weight 217 lb (98.4 kg), SpO2 94 %. on room air. Physical Exam  Constitutional: He is awake and alert. He appears well-developed.  Head: Normocephalic and atraumatic.  Ears: Normal auricles and EACs. Nose: Normal mucosa, septum, and turbinates. Eyes: PERRL, EOM are normal.  Neck: No tracheal deviation present. The trach stoma is closed. Cardiovascular: Normal rate and regular rhythm.   Pulmonary/Chest: Effort normal and breath sounds normal. No respiratory distress.  Abdominal: Soft. Bowel sounds are normal.  Musculoskeletal: Normal range of motion.  Skin: Skin is warm and dry. Capillary refill takes less than 2 seconds.  Psychiatric: He has a normal mood and affect. His behavior is normal.  Nursing note and vitals reviewed.  Assessment/Plan: Pt's trach tract has healed. Unable to replace the #4 Shiley trach tube. He is in no respiratory distress. Janina Mayo was previously placed for prolonged intubation. Currently O2 saturation above 90% on room air. I suspect he no longer needs the trach tube. Pt to be observed overnight. May d/c back to nursing home tomorrow is he does well overnight.   Torrez Renfroe W Odena Mcquaid 05/05/2017, 10:58 PM

## 2017-05-05 NOTE — Progress Notes (Signed)
RTX2 tried to put trach back into place, and were unsuccessful due to stoma being mostly closed. RT informed RN ENT will have to be called in order for trach to be placed. RT to monitor as needed

## 2017-05-05 NOTE — ED Triage Notes (Signed)
Pt brought to ED by [facility] from [facility], that pull out his trach and became combative with Rehab staff and refuses to let them put the trach back in place, pt is AO x4, no combative on arrival, no respiratory distress noticed.

## 2017-05-05 NOTE — H&P (Signed)
TRH H&P   Patient Demographics:    Curtis Keller, is a 61 y.o. male  MRN: [id number]   DOB - Dec 21, 1955  Admit Date - 05/05/2017  Outpatient Primary MD for the patient is [facility] [place]   Referring MD/NP/PA: Margarita Grizzle  Outpatient Specialists: [employer]   Patient coming from: [facility]  Chief Complaint  Patient presents with  . Tracheostomy Tube Change    pt pull trach out      HPI:    Curtis Keller  is a 60 y.o. male, who is transported to [facility]  ER today with a dislodge trach.  The Janina Mayo was placed by Donnal Debar 12/2016.  Placed due to prolonged intubation.    In ED, ENT attempted to replace trach without success.  ENT believes per ED that he may be fine without the tach.  Recommended overnite observation and can d/c to [facility] if doing well.     Review of systems:    In addition to the HPI above,    No Fever-chills, No Headache, No changes with Vision or hearing, No problems swallowing food or Liquids, No Chest pain, Cough or Shortness of Breath, No Abdominal pain, No Nausea or Vommitting, Bowel movements are regular, No Blood in stool or Urine, No dysuria, No new skin rashes or bruises, No new joints pains-aches,  No new weakness, tingling, numbness in any extremity, No recent weight gain or loss, No polyuria, polydypsia or polyphagia, No significant Mental Stressors.  A full 10 point Review of Systems was done, except as stated above, all other Review of Systems were negative.   With Past History of the following :    Past Medical History:  Diagnosis Date  . Acute respiratory failure (HCC)    a. Spring 2017 following fall/splenectomy -->admitted to [facility]-->trach/g tube.  . Anemia    a. 12/2015 ABL in setting of fall/hematomas/splenic laceration req splenectomy.  Marland Kitchen Anxiety   .  Apical mural thrombus    a. 11/2015 Echo: EF 25-30%, moderate size apical thrombus-->coumadin;  b. 12/2015 f/u Echo: EF 25-30%, ant/septa HK, no obvious large thrombus but cannot exclude small mural thrombus.  Marland Kitchen CAD (coronary artery disease)    a. 12/2014 s/p BMS to the RCA.  Marland Kitchen Cerebral infarction (HCC)   . Chronic bronchitis (HCC)    "probably"/sister 04/07/2017  . Chronic systolic CHF (congestive heart failure) (HCC)    a. 12/2015 Echo: EF 25-30%.  . Dementia   . Depression   . Dysphagia   . Falls    a. 11/2015 traumatic fall with resultant trauma req splenectomy and prolonged hospitalization complicated by resp failure.  . Hepatitis C    "never treated" (04/07/2017)  . History of blood transfusion 12/2015; 04/07/2017   "related to spleen OR; related to low HgB"   . History of  gout   . Hyperlipidemia   . Hypertension   . Ischemic cardiomyopathy    a. 12/2015 Echo: EF 25-30%, anterior and septal HK.  Marland Kitchen Left hemiplegia (HCC)   . Myocardial infarction (HCC) 1999; ?2010  . On home oxygen therapy    "28% at the SNF" (6/15/2018Z)  . Pneumonia    "several times since 12/19/2015" (6/15/20180)  . Protein calorie malnutrition (HCC)   . Respiratory failure (HCC)   . Spleen injury   . Status post tracheostomy (HCC)    a. 01/2016 in setting of ongoing resp failure and aspiration.  . Stroke (HCC) 12/19/2015   "left side paralyzed since" (04/07/2017)  . Type II diabetes mellitus (HCC)       Past Surgical History:  Procedure Laterality Date  . ABDOMINAL HERNIA REPAIR    . CARDIAC CATHETERIZATION    . CORONARY ANGIOPLASTY WITH STENT PLACEMENT  1999   "[place]"  . HERNIA REPAIR    . HIP ARTHROPLASTY Left 02/10/2017   Procedure: LEFT HIP HEMIARTHROPLASTY;  Surgeon: [name], MD;  Location: [facility] OR;  Service: Orthopedics;  Laterality: Left;  . IR GASTROSTOMY TUBE MOD SED  02/02/2017  . IR GENERIC HISTORICAL  07/22/2016   IR GASTROSTOMY TUBE REMOVAL 07/22/2016 [name], MD [facility]-INTERV RAD  . PR  LAP, SPLENECTOMY  01/07/2016  . TRACHEOSTOMY TUBE PLACEMENT N/A 02/22/2016   Procedure: TRACHEOSTOMY;  Surgeon: [name], MD;  Location: [facility] OR;  Service: ENT;  Laterality: N/A;  . TRACHEOSTOMY TUBE PLACEMENT N/A 01/14/2017   Procedure: TRACHEOSTOMY;  Surgeon: [name], MD;  Location: [facility] OR;  Service: ENT;  Laterality: N/A;  inserted #6DCT EXH#[id number]      Social History:     Social History  Substance Use Topics  . Smoking status: Former Smoker    Packs/day: 2.00    Years: 38.00    Types: Cigarettes    Quit date: 11/25/2015  . Smokeless tobacco: Never Used     Comment: quit spring of 2017.  Marland Kitchen Alcohol use No     Lives - at [facility]  Mobility - unclear   Family History :     Family History  Problem Relation Age of Onset  . Alcohol abuse Mother   . Hypertension Mother   . Coronary artery disease Mother   . Alcohol abuse Father   . Hypertension Father   . Coronary artery disease Father   . Alcohol abuse Maternal Uncle       Home Medications:   Prior to Admission medications   Medication Sig Start Date End Date Taking? Authorizing Provider  acetylcysteine (MUCOMYST) 20 % nebulizer solution Take 4 mLs by nebulization 2 (two) times daily. 04/13/17  Yes Rai, Ripudeep K, MD  Amino Acids-Protein Hydrolys (FEEDING SUPPLEMENT, PRO-STAT SUGAR FREE 64,) LIQD Place 60 mLs into feeding tube 2 (two) times daily.    Yes [provider]  atorvastatin (LIPITOR) 40 MG tablet Place 40 mg into feeding tube at bedtime. for Lipids   Yes [provider]  baclofen (LIORESAL) 10 MG tablet Take 5-10 mg by mouth See admin instructions. Take 5 mg to 10 mg three times a day for spasms   Yes [provider]  busPIRone (BUSPAR) 5 MG tablet Take 5 mg by mouth 2 (two) times daily.   Yes [provider]  enoxaparin (LOVENOX) 100 MG/ML injection Inject 1 mL (100 mg total) into the skin 2 (two) times daily. Continue lovenox bridge until INR  therapeutic  stable between 2-3, then off 04/13/17  Yes Rai, Ripudeep K, MD  famotidine (PEPCID) 20 MG tablet Place 20 mg into feeding tube 2 (two) times daily. for GERD   Yes [provider]  folic acid (FOLVITE) 1 MG tablet Place 1 mg into feeding tube daily.    Yes [provider]  guaiFENesin 200 MG tablet Place 200 mg into feeding tube 3 (three) times daily. for CHF   Yes [provider]  ipratropium-albuterol (DUONEB) 0.5-2.5 (3) MG/3ML SOLN Take 3 mLs by nebulization 4 (four) times daily. 04/13/17  Yes Rai, Ripudeep K, MD  lidocaine (LIDODERM) 5 % Place 2 patches onto the skin See admin instructions. Apply 2 patches to left knee every morning -remove & discard patch within 12 hours or as directed by MD 04/13/17  Yes Rai, Ripudeep K, MD  metoprolol tartrate (LOPRESSOR) 25 MG tablet Place 25 mg into feeding tube 2 (two) times daily.   Yes [provider]  Multiple Vitamin (MULTIVITAMIN WITH MINERALS) TABS tablet Place 1 tablet into feeding tube daily.   Yes [provider]  Nutritional Supplements (FEEDING SUPPLEMENT, OSMOLITE 1.5 CAL,) LIQD Place 1,000 mLs into feeding tube See admin instructions. Run at 80 cc/hr 8 hrs via peg tube. Start at 1600 and stop  @@ 10 am two times a day related to tracheostomy status   Yes [provider]  OLANZapine (ZYPREXA) 5 MG tablet Place 5 mg into feeding tube 2 (two) times daily.    Yes [provider]  Omega-3 Fatty Acids (FISH OIL) 1000 MG CAPS Place 1,000 mg into feeding tube daily.   Yes [provider]  polyethylene glycol (MIRALAX / GLYCOLAX) packet Place 17 g into feeding tube daily. for constipation   Yes [provider]  tamsulosin (FLOMAX) 0.4 MG CAPS capsule 0.4 mg daily at 6 PM. Give 1 capsule (0.4 mg) via PEG-Tube one time daily   Yes [provider]  vitamin B-12 1000 MCG tablet Place 1 tablet (1,000 mcg total) into feeding tube daily. 04/14/17  Yes Rai, Ripudeep  K, MD  warfarin (COUMADIN) 6 MG tablet Place 1-6 mg into feeding tube daily at 6 PM.    Yes [provider]  albuterol (PROVENTIL) (2.5 MG/3ML) 0.083% nebulizer solution Take 3 mLs (2.5 mg total) by nebulization every 4 (four) hours as needed for wheezing or shortness of breath. 04/13/17   Rai, Ripudeep K, MD  bisacodyl (DULCOLAX) 10 MG suppository Place 1 suppository (10 mg total) rectally daily as needed for moderate constipation. 04/13/17   Cathren Harsh, MD  OXYGEN See admin instructions. Oxygen at 28% via trach cuff every shift    [provider]  senna (SENOKOT) 8.6 MG TABS tablet Place 1 tablet (8.6 mg total) into feeding tube daily as needed for mild constipation. For constipation 04/13/17   Rai, Delene Ruffini, MD  Water For Irrigation, Sterile (FREE WATER) SOLN Place 150 mLs into feeding tube every 4 (four) hours. 03/30/17   Richarda Overlie, MD     Allergies:     Allergies  Allergen Reactions  . Codeine Anaphylaxis and Nausea And Vomiting  . Lisinopril Itching and Rash  . Hydrocodone Other (See Comments)    On MAR  . Hydrocodone-Acetaminophen Nausea And Vomiting    Cannot take any acetaminophen due to liver issues per sister  . Tegaderm Ag Mesh [Silver] Other (See Comments)    Allergy to "silver compounds" listed on MAR  . Tylenol [Acetaminophen] Nausea Only and Other (  See Comments)    Cannot take any acetaminophen due to liver issues per sister     Physical Exam:   Vitals  Blood pressure 94/65, pulse 68, temperature 98.4 F (36.9 C), temperature source Oral, resp. rate 11, height 6\' 2"  (1.88 m), weight 98.4 kg (217 lb), SpO2 94 %.   1. General  lying in bed in NAD,    2. Normal affect and insight, Not Suicidal or Homicidal, Awake Alert, Oriented X 3.  3. No F.N deficits, ALL C.Nerves Intact, Strength 5/5 all 4 extremities, Sensation intact all 4 extremities, Plantars down going.  4. Ears and Eyes appear Normal, Conjunctivae clear, PERRLA. Moist Oral  Mucosa.  5. Supple Neck, evidence of prior trach.   No JVD, No cervical lymphadenopathy appriciated, No Carotid Bruits.  6. Symmetrical Chest wall movement, Good air movement bilaterally, CTAB.  7. RRR, No Gallops, Rubs or Murmurs, No Parasternal Heave.  8. Positive Bowel Sounds, Abdomen Soft, No tenderness, No organomegaly appriciated,No rebound -guarding or rigidity.  9.  No Cyanosis, Normal Skin Turgor, No Skin Rash or Bruise.  10. Good muscle tone,  joints appear normal , no effusions, Normal ROM.  11. No Palpable Lymph Nodes in Neck or Axillae  +PEG    Data Review:    CBC No results for input(s): WBC, HGB, HCT, PLT, MCV, MCH, MCHC, RDW, LYMPHSABS, MONOABS, EOSABS, BASOSABS, BANDABS in the last 168 hours.  Invalid input(s): NEUTRABS, BANDSABD ------------------------------------------------------------------------------------------------------------------  Chemistries  No results for input(s): NA, K, CL, CO2, GLUCOSE, BUN, CREATININE, CALCIUM, MG, AST, ALT, ALKPHOS, BILITOT in the last 168 hours.  Invalid input(s): GFRCGP ------------------------------------------------------------------------------------------------------------------ CrCl cannot be calculated (Patient's most recent lab result is older than the maximum 21 days allowed.). ------------------------------------------------------------------------------------------------------------------ No results for input(s): TSH, T4TOTAL, T3FREE, THYROIDAB in the last 72 hours.  Invalid input(s): FREET3  Coagulation profile No results for input(s): INR, PROTIME in the last 168 hours. ------------------------------------------------------------------------------------------------------------------- No results for input(s): DDIMER in the last 72 hours. -------------------------------------------------------------------------------------------------------------------  Cardiac Enzymes No results for input(s): CKMB,  TROPONINI, MYOGLOBIN in the last 168 hours.  Invalid input(s): CK ------------------------------------------------------------------------------------------------------------------    Component Value Date/Time   BNP 199.5 (H) 03/30/2017 0629     ---------------------------------------------------------------------------------------------------------------  Urinalysis    Component Value Date/Time   COLORURINE YELLOW 04/07/2017 1512   APPEARANCEUR CLEAR 04/07/2017 1512   LABSPEC 1.016 04/07/2017 1512   PHURINE 7.0 04/07/2017 1512   GLUCOSEU NEGATIVE 04/07/2017 1512   HGBUR LARGE (A) 04/07/2017 1512   BILIRUBINUR NEGATIVE 04/07/2017 1512   KETONESUR NEGATIVE 04/07/2017 1512   PROTEINUR 100 (A) 04/07/2017 1512   NITRITE NEGATIVE 04/07/2017 1512   LEUKOCYTESUR NEGATIVE 04/07/2017 1512    ----------------------------------------------------------------------------------------------------------------   Imaging Results:    No results found.    Assessment & Plan:    Active Problems:   Heart failure, chronic systolic (HCC)   Diabetes mellitus, type II, insulin dependent (HCC)   History of tracheostomy    Tracheostomy removed by pt Leave out overnite, if o2 sat >90% then may send back without replacement to SNF  Dm2 fsbs q4h iss  CHF (EF 25-30%) Cont metoprolol 25mg  bid  CVA, hx of thrombus (atrial) Cont lipitor 40mg  po qhs Cont coumadin , pharmacy to dose  L hemiplegia Cont baclofen  Hx of pneumonia duoneb qid   FEN osmolite 1.5 80ml per hour start 4pm and end 10 am Water flush 150 ml peg q4h  DVT Prophylaxis Lovenox 1mg  /kg [place] bid,  Coumadin pharmacy to dose, SCD  AM  Labs Ordered, also please review Full Orders  Family Communication: Admission, patients condition and plan of care including tests being ordered have been discussed with the patient  who indicate understanding and agree with the plan and Code Status.  Code Status FULL CODE  Likely DC to   SNF [place]  Condition GUARDED    Consults called: ENT by ED  Admission status: observation  Time spent in minutes : 45 minutes   Pearson Grippe M.D on 05/05/2017 at 11:38 PM  Between 7pm to 7am - Pager - [phone]. After 7am go to [email or web address] - password [facility]  [facility] - Office  [phone]

## 2017-05-05 NOTE — ED Provider Notes (Signed)
[facility]-EMERGENCY DEPT Provider Note   CSN: [id number] Arrival date & time: 05/05/17  1939     History   Chief Complaint Chief Complaint  Patient presents with  . Tracheostomy Tube Change    pt pull trach out    HPI CESAR ROGERSON is a 61 y.o. male.  HPI  Is a 61 year old man history of CVA chronic respiratory failure presents today with his tracheostomy out. Tracheostomy tube was placed by Dr. Ezzard Standing in March of this year. This was placed due to history of CHF, CVA and multiple admissions for respiratory problems.  Patient is at [facility]. They report that his tracheostomy tube is out.  Previously patient had pulled out. Had no report of any respiratory distress.  Past Medical History:  Diagnosis Date  . Acute respiratory failure (HCC)    a. Spring 2017 following fall/splenectomy -->admitted to [facility]-->trach/g tube.  . Anemia    a. 12/2015 ABL in setting of fall/hematomas/splenic laceration req splenectomy.  Marland Kitchen Anxiety   . Apical mural thrombus    a. 11/2015 Echo: EF 25-30%, moderate size apical thrombus-->coumadin;  b. 12/2015 f/u Echo: EF 25-30%, ant/septa HK, no obvious large thrombus but cannot exclude small mural thrombus.  Marland Kitchen CAD (coronary artery disease)    a. 12/2014 s/p BMS to the RCA.  Marland Kitchen Cerebral infarction (HCC)   . Chronic bronchitis (HCC)    "probably"/sister 04/07/2017  . Chronic systolic CHF (congestive heart failure) (HCC)    a. 12/2015 Echo: EF 25-30%.  . Dementia   . Depression   . Dysphagia   . Falls    a. 11/2015 traumatic fall with resultant trauma req splenectomy and prolonged hospitalization complicated by resp failure.  . Hepatitis C    "never treated" (04/07/2017)  . History of blood transfusion 12/2015; 04/07/2017   "related to spleen OR; related to low HgB"   . History of gout   . Hyperlipidemia   . Hypertension   . Ischemic cardiomyopathy    a. 12/2015 Echo: EF 25-30%, anterior and septal HK.  Marland Kitchen Left hemiplegia (HCC)    . Myocardial infarction (HCC) 1999; ?2010  . On home oxygen therapy    "28% at the SNF" (6/15/2018Z)  . Pneumonia    "several times since 12/19/2015" (6/15/20180)  . Protein calorie malnutrition (HCC)   . Respiratory failure (HCC)   . Spleen injury   . Status post tracheostomy (HCC)    a. 01/2016 in setting of ongoing resp failure and aspiration.  . Stroke (HCC) 12/19/2015   "left side paralyzed since" (04/07/2017)  . Type II diabetes mellitus [facility])     Patient Active Problem List   Diagnosis Date Noted  . Sepsis (HCC) 04/07/2017  . Acute on chronic respiratory failure with hypoxia (HCC)   . Tracheostomy status (HCC)   . SOB (shortness of breath)   . Hypoxia   . Pressure injury of skin 03/20/2017  . HCAP (healthcare-associated pneumonia) 03/18/2017  . Pneumonia 03/17/2017  . 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 Depression with anxiety 01/23/2017  . Encounter for hospice care discussion   . DNR (do not resuscitate)   . Ischemic cardiomyopathy   . Goals of care, counseling/discussion   . Palliative care encounter   . Acute systolic congestive heart failure (HCC)   . Acute and chronic respiratory failure with hypoxia (HCC) 11/28/2016  . Hip fracture (HCC) 11/27/2016  . Closed nondisplaced intertrochanteric fracture of left femur (HCC) 11/27/2016  .  Chronic respiratory failure (HCC) 11/27/2016  . Fall at nursing home 07/17/2016  . Diabetes mellitus, type II, insulin dependent (HCC) 06/12/2016  . GERD (gastroesophageal reflux disease) 05/07/2016  . COPD (chronic obstructive pulmonary disease) (HCC) 04/18/2016  . Chronic anticoagulation 03/22/2016  . S/P splenectomy 03/13/2016  . PEG (percutaneous endoscopic gastrostomy) status (HCC) 03/13/2016  . MRSA (methicillin resistant Staphylococcus aureus) septicemia (HCC) 03/13/2016  . Aspiration pneumonia (HCC) 03/13/2016  . Dysphagia 03/13/2016  . Acute blood loss anemia 03/13/2016  . Hyperlipidemia  03/13/2016  . Heart failure, chronic systolic (HCC)   . Hypertension   . Depression   . Status post tracheostomy (HCC)   . Cardiomyopathy (HCC)   . Apical mural thrombus   . Protein calorie malnutrition (HCC)   . Intraparenchymal hematoma of brain due to trauma [facility])     Past Surgical History:  Procedure Laterality Date  . ABDOMINAL HERNIA REPAIR    . CARDIAC CATHETERIZATION    . CORONARY ANGIOPLASTY WITH STENT PLACEMENT  1999   "[place]"  . HERNIA REPAIR    . HIP ARTHROPLASTY Left 02/10/2017   Procedure: LEFT HIP HEMIARTHROPLASTY;  Surgeon: [name], MD;  Location: [facility] OR;  Service: Orthopedics;  Laterality: Left;  . IR GASTROSTOMY TUBE MOD SED  02/02/2017  . IR GENERIC HISTORICAL  07/22/2016   IR GASTROSTOMY TUBE REMOVAL 07/22/2016 [name], MD [facility]-INTERV RAD  . PR LAP, SPLENECTOMY  01/07/2016  . TRACHEOSTOMY TUBE PLACEMENT N/A 02/22/2016   Procedure: TRACHEOSTOMY;  Surgeon: [name], MD;  Location: [facility] OR;  Service: ENT;  Laterality: N/A;  . TRACHEOSTOMY TUBE PLACEMENT N/A 01/14/2017   Procedure: TRACHEOSTOMY;  Surgeon: [name], MD;  Location: [facility] OR;  Service: ENT;  Laterality: N/A;  inserted #[id number] ZOX#[id number]       Home Medications    Prior to Admission medications   Medication Sig Start Date End Date Taking? Authorizing Provider  acetylcysteine (MUCOMYST) 20 % nebulizer solution Take 4 mLs by nebulization 2 (two) times daily. 04/13/17  Yes Rai, Ripudeep K, MD  Amino Acids-Protein Hydrolys (FEEDING SUPPLEMENT, PRO-STAT SUGAR FREE 64,) LIQD Place 60 mLs into feeding tube 2 (two) times daily.    Yes [provider]  atorvastatin (LIPITOR) 40 MG tablet Place 40 mg into feeding tube at bedtime. for Lipids   Yes [provider]  baclofen (LIORESAL) 10 MG tablet Take 5-10 mg by mouth See admin instructions. Take 5 mg to 10 mg three times a day for spasms   Yes [provider]  busPIRone (BUSPAR) 5 MG tablet Take 5 mg by mouth 2  (two) times daily.   Yes [provider]  enoxaparin (LOVENOX) 100 MG/ML injection Inject 1 mL (100 mg total) into the skin 2 (two) times daily. Continue lovenox bridge until INR therapeutic stable between 2-3, then off 04/13/17  Yes Rai, Ripudeep K, MD  famotidine (PEPCID) 20 MG tablet Place 20 mg into feeding tube 2 (two) times daily. for GERD   Yes [provider]  folic acid (FOLVITE) 1 MG tablet Place 1 mg into feeding tube daily.    Yes [provider]  guaiFENesin 200 MG tablet Place 200 mg into feeding tube 3 (three) times daily. for CHF   Yes [provider]  ipratropium-albuterol (DUONEB) 0.5-2.5 (3) MG/3ML SOLN Take 3 mLs by nebulization 4 (four) times daily. 04/13/17  Yes Rai, Ripudeep K, MD  lidocaine (LIDODERM) 5 % Place 2 patches onto the skin See admin instructions. Apply 2  patches to left knee every morning -remove & discard patch within 12 hours or as directed by MD 04/13/17  Yes Rai, Ripudeep K, MD  metoprolol tartrate (LOPRESSOR) 25 MG tablet Place 25 mg into feeding tube 2 (two) times daily.   Yes [provider]  Multiple Vitamin (MULTIVITAMIN WITH MINERALS) TABS tablet Place 1 tablet into feeding tube daily.   Yes [provider]  Nutritional Supplements (FEEDING SUPPLEMENT, OSMOLITE 1.5 CAL,) LIQD Place 1,000 mLs into feeding tube See admin instructions. Run at 80 cc/hr 8 hrs via peg tube. Start at 1600 and stop  @@ 10 am two times a day related to tracheostomy status   Yes [provider]  OLANZapine (ZYPREXA) 5 MG tablet Place 5 mg into feeding tube 2 (two) times daily.    Yes [provider]  Omega-3 Fatty Acids (FISH OIL) 1000 MG CAPS Place 1,000 mg into feeding tube daily.   Yes [provider]  polyethylene glycol (MIRALAX / GLYCOLAX) packet Place 17 g into feeding tube daily. for constipation   Yes [provider]  tamsulosin (FLOMAX) 0.4 MG CAPS capsule 0.4 mg daily at 6 PM. Give 1  capsule (0.4 mg) via PEG-Tube one time daily   Yes [provider]  vitamin B-12 1000 MCG tablet Place 1 tablet (1,000 mcg total) into feeding tube daily. 04/14/17  Yes Rai, Ripudeep K, MD  warfarin (COUMADIN) 6 MG tablet Place 1-6 mg into feeding tube daily at 6 PM.    Yes [provider]  albuterol (PROVENTIL) (2.5 MG/3ML) 0.083% nebulizer solution Take 3 mLs (2.5 mg total) by nebulization every 4 (four) hours as needed for wheezing or shortness of breath. 04/13/17   Rai, Ripudeep K, MD  bisacodyl (DULCOLAX) 10 MG suppository Place 1 suppository (10 mg total) rectally daily as needed for moderate constipation. 04/13/17   Cathren Harsh, MD  OXYGEN See admin instructions. Oxygen at 28% via trach cuff every shift    [provider]  senna (SENOKOT) 8.6 MG TABS tablet Place 1 tablet (8.6 mg total) into feeding tube daily as needed for mild constipation. For constipation 04/13/17   Rai, Delene Ruffini, MD  Water For Irrigation, Sterile (FREE WATER) SOLN Place 150 mLs into feeding tube every 4 (four) hours. 03/30/17   Richarda Overlie, MD    Family History Family History  Problem Relation Age of Onset  . Alcohol abuse Mother   . Hypertension Mother   . Coronary artery disease Mother   . Alcohol abuse Father   . Hypertension Father   . Coronary artery disease Father   . Alcohol abuse Maternal Uncle     Social History Social History  Substance Use Topics  . Smoking status: Former Smoker    Packs/day: 2.00    Years: 38.00    Types: Cigarettes    Quit date: 11/25/2015  . Smokeless tobacco: Never Used     Comment: quit spring of 2017.  Marland Kitchen Alcohol use No     Allergies   Codeine; Lisinopril; Hydrocodone; Hydrocodone-acetaminophen; Tegaderm ag mesh [silver]; and Tylenol [acetaminophen]   Review of Systems Review of Systems  Unable to perform ROS: Dementia     Physical Exam Updated Vital Signs BP 94/65   Pulse 68   Temp 98.4 F (36.9 C) (Oral)   Resp 11   Ht 1.88  m (6\' 2" )   Wt 98.4 kg (217 lb)   SpO2 94%   BMI 27.86 kg/m   Physical Exam  Constitutional:  He is oriented to person, place, and time. He appears well-developed.  HENT:  Head: Normocephalic and atraumatic.  Right Ear: External ear normal.  Left Ear: External ear normal.  Nose: Nose normal.  Eyes: EOM are normal.  Neck: No tracheal deviation present.  Stoma in neck  Cardiovascular: Normal rate and regular rhythm.   Pulmonary/Chest: Effort normal and breath sounds normal. No respiratory distress.  Abdominal: Soft. Bowel sounds are normal.  Musculoskeletal: Normal range of motion.  Neurological: He is alert and oriented to person, place, and time.  Skin: Skin is warm and dry. Capillary refill takes less than 2 seconds.  Psychiatric: He has a normal mood and affect. His behavior is normal.  Nursing note and vitals reviewed.    ED Treatments / Results  Labs (all labs ordered are listed, but only abnormal results are displayed) Labs Reviewed - No data to display  EKG  EKG Interpretation None       Radiology No results found.  Procedures Procedures (including critical care time)  Medications Ordered in ED Medications - No data to display   Initial Impression / Assessment and Plan / ED Course  I have reviewed the triage vital signs and the nursing notes.  Pertinent labs & imaging results that were available during my care of the patient were reviewed by me and considered in my medical decision making (see chart for details).   respiratory therapy and I each attempt to replace trach but were unable. I consulted with Dr. Suszanne Conners. He has seen patient is also unable to replace trach. He should appears to have stable respiratory status. ENT consult advises the patient should be admitted and observed tonight for his respiratory status. Dr. Suszanne Conners feels that he may be able to continue without trach if his respiratory status remains stable. He is available for calls if he has any  respiratory distress.  Final Clinical Impressions(s) / ED Diagnoses   Final diagnoses:  Complication of tracheostomy tube [facility])    New Prescriptions New Prescriptions   No medications on file     Margarita Grizzle, MD 05/06/17 1622

## 2017-05-06 ENCOUNTER — Observation Stay (HOSPITAL_COMMUNITY): Payer: Medicare Other

## 2017-05-06 DIAGNOSIS — Z931 Gastrostomy status: Secondary | ICD-10-CM

## 2017-05-06 DIAGNOSIS — Z7901 Long term (current) use of anticoagulants: Secondary | ICD-10-CM

## 2017-05-06 DIAGNOSIS — J690 Pneumonitis due to inhalation of food and vomit: Secondary | ICD-10-CM

## 2017-05-06 DIAGNOSIS — R918 Other nonspecific abnormal finding of lung field: Secondary | ICD-10-CM | POA: Diagnosis not present

## 2017-05-06 DIAGNOSIS — I255 Ischemic cardiomyopathy: Secondary | ICD-10-CM

## 2017-05-06 DIAGNOSIS — J9621 Acute and chronic respiratory failure with hypoxia: Secondary | ICD-10-CM | POA: Diagnosis not present

## 2017-05-06 DIAGNOSIS — E46 Unspecified protein-calorie malnutrition: Secondary | ICD-10-CM

## 2017-05-06 DIAGNOSIS — I517 Cardiomegaly: Secondary | ICD-10-CM | POA: Diagnosis not present

## 2017-05-06 DIAGNOSIS — J9509 Other tracheostomy complication: Secondary | ICD-10-CM | POA: Diagnosis not present

## 2017-05-06 DIAGNOSIS — Z9081 Acquired absence of spleen: Secondary | ICD-10-CM

## 2017-05-06 LAB — CBC
HCT: 41.9 % (ref 39.0–52.0)
HCT: 40.3 % (ref 39.0–52.0)
Hemoglobin: 13.7 g/dL (ref 13.0–17.0)
Hemoglobin: 13.1 g/dL (ref 13.0–17.0)
MCH: 31.8 pg (ref 26.0–34.0)
MCH: 31.2 pg (ref 26.0–34.0)
MCHC: 32.7 g/dL (ref 30.0–36.0)
MCHC: 32.5 g/dL (ref 30.0–36.0)
MCV: 97.2 fL (ref 78.0–100.0)
MCV: 96 fL (ref 78.0–100.0)
PLATELETS: 247 10*3/uL (ref 150–400)
Platelets: 268 10*3/uL (ref 150–400)
RBC: 4.31 MIL/uL (ref 4.22–5.81)
RBC: 4.2 MIL/uL — AB (ref 4.22–5.81)
RDW: 15 % (ref 11.5–15.5)
RDW: 15.2 % (ref 11.5–15.5)
WBC: 10.8 10*3/uL — AB (ref 4.0–10.5)
WBC: 10.3 10*3/uL (ref 4.0–10.5)

## 2017-05-06 LAB — PROTIME-INR
INR: 1.26
Prothrombin Time: 15.9 seconds — ABNORMAL HIGH (ref 11.4–15.2)

## 2017-05-06 LAB — MRSA PCR SCREENING: MRSA BY PCR: POSITIVE — AB

## 2017-05-06 LAB — COMPREHENSIVE METABOLIC PANEL
ALBUMIN: 2.3 g/dL — AB (ref 3.5–5.0)
ALT: 33 U/L (ref 17–63)
ANION GAP: 9 (ref 5–15)
AST: 36 U/L (ref 15–41)
Alkaline Phosphatase: 142 U/L — ABNORMAL HIGH (ref 38–126)
BUN: 38 mg/dL — AB (ref 6–20)
CHLORIDE: 106 mmol/L (ref 101–111)
CO2: 25 mmol/L (ref 22–32)
Calcium: 9.9 mg/dL (ref 8.9–10.3)
Creatinine, Ser: 1.87 mg/dL — ABNORMAL HIGH (ref 0.61–1.24)
GFR calc Af Amer: 43 mL/min — ABNORMAL LOW (ref >60)
GFR, EST NON AFRICAN AMERICAN: 37 mL/min — AB (ref >60)
Glucose, Bld: 98 mg/dL (ref 65–99)
POTASSIUM: 4.1 mmol/L (ref 3.5–5.1)
Sodium: 140 mmol/L (ref 135–145)
TOTAL PROTEIN: 7.6 g/dL (ref 6.5–8.1)
Total Bilirubin: 0.7 mg/dL (ref 0.3–1.2)

## 2017-05-06 LAB — CBG MONITORING, ED: Glucose-Capillary: 94 mg/dL (ref 65–99)

## 2017-05-06 LAB — GLUCOSE, CAPILLARY
Glucose-Capillary: 125 mg/dL — ABNORMAL HIGH (ref 65–99)
Glucose-Capillary: 141 mg/dL — ABNORMAL HIGH (ref 65–99)

## 2017-05-06 MED ORDER — SENNA 8.6 MG PO TABS
1.0000 | ORAL_TABLET | Freq: Every day | ORAL | Status: DC | PRN
  Filled 2017-05-06: qty 1

## 2017-05-06 MED ORDER — WARFARIN SODIUM 4 MG PO TABS
8.0000 mg | ORAL_TABLET | Freq: Every day | ORAL | Status: DC
  Administered 2017-05-06: 8 mg via ORAL
  Filled 2017-05-06 (×2): qty 2

## 2017-05-06 MED ORDER — IPRATROPIUM-ALBUTEROL 0.5-2.5 (3) MG/3ML IN SOLN
3.0000 mL | Freq: Four times a day (QID) | RESPIRATORY_TRACT | Status: DC
  Administered 2017-05-06 (×2): 3 mL via RESPIRATORY_TRACT
  Filled 2017-05-06 (×2): qty 3

## 2017-05-06 MED ORDER — SODIUM CHLORIDE 0.9 % IV SOLN: 250.0000 mL | INTRAVENOUS | Status: DC | PRN

## 2017-05-06 MED ORDER — FOLIC ACID 1 MG PO TABS
1.0000 mg | ORAL_TABLET | Freq: Every day | ORAL | Status: DC
  Administered 2017-05-06 – 2017-05-07 (×2): 1 mg
  Filled 2017-05-06 (×2): qty 1

## 2017-05-06 MED ORDER — ALBUTEROL SULFATE (2.5 MG/3ML) 0.083% IN NEBU: 2.5000 mg | INHALATION_SOLUTION | RESPIRATORY_TRACT | Status: DC | PRN

## 2017-05-06 MED ORDER — FAMOTIDINE 20 MG PO TABS
20.0000 mg | ORAL_TABLET | Freq: Two times a day (BID) | ORAL | Status: DC
  Administered 2017-05-06 – 2017-05-07 (×3): 20 mg
  Filled 2017-05-06 (×3): qty 1

## 2017-05-06 MED ORDER — BISACODYL 10 MG RE SUPP: 10.0000 mg | Freq: Every day | RECTAL | Status: DC | PRN

## 2017-05-06 MED ORDER — CHLORHEXIDINE GLUCONATE 0.12 % MT SOLN
15.0000 mL | Freq: Four times a day (QID) | OROMUCOSAL | Status: DC
  Administered 2017-05-06 – 2017-05-07 (×4): 15 mL via OROMUCOSAL
  Filled 2017-05-06 (×3): qty 15

## 2017-05-06 MED ORDER — METOPROLOL TARTRATE 25 MG PO TABS
25.0000 mg | ORAL_TABLET | Freq: Two times a day (BID) | ORAL | Status: DC
  Administered 2017-05-06 – 2017-05-07 (×3): 25 mg
  Filled 2017-05-06 (×3): qty 1

## 2017-05-06 MED ORDER — TAMSULOSIN HCL 0.4 MG PO CAPS
0.4000 mg | ORAL_CAPSULE | Freq: Every day | ORAL | Status: DC
  Administered 2017-05-06 – 2017-05-07 (×2): 0.4 mg via ORAL
  Filled 2017-05-06 (×2): qty 1

## 2017-05-06 MED ORDER — ACETYLCYSTEINE 20 % IN SOLN: 600.0000 mg | RESPIRATORY_TRACT | Status: DC

## 2017-05-06 MED ORDER — MUPIROCIN 2 % EX OINT
1.0000 "application " | TOPICAL_OINTMENT | Freq: Two times a day (BID) | CUTANEOUS | Status: DC
  Administered 2017-05-06 – 2017-05-07 (×2): 1 via NASAL

## 2017-05-06 MED ORDER — ADULT MULTIVITAMIN W/MINERALS CH
1.0000 | ORAL_TABLET | Freq: Every day | ORAL | Status: DC
  Administered 2017-05-06 – 2017-05-07 (×2): 1
  Filled 2017-05-06 (×2): qty 1

## 2017-05-06 MED ORDER — INSULIN ASPART 100 UNIT/ML ~~LOC~~ SOLN
0.0000 [IU] | Freq: Three times a day (TID) | SUBCUTANEOUS | Status: DC
  Administered 2017-05-07: 2 [IU] via SUBCUTANEOUS

## 2017-05-06 MED ORDER — FREE WATER
150.0000 mL | Status: DC
  Administered 2017-05-06 – 2017-05-07 (×7): 150 mL

## 2017-05-06 MED ORDER — ENOXAPARIN SODIUM 100 MG/ML ~~LOC~~ SOLN
100.0000 mg | Freq: Two times a day (BID) | SUBCUTANEOUS | Status: DC
  Administered 2017-05-06 – 2017-05-07 (×3): 100 mg via SUBCUTANEOUS
  Filled 2017-05-06 (×4): qty 1

## 2017-05-06 MED ORDER — OSMOLITE 1.5 CAL PO LIQD
640.0000 mL | ORAL | Status: DC
  Administered 2017-05-06 – 2017-05-07 (×2): 640 mL
  Filled 2017-05-06 (×4): qty 711

## 2017-05-06 MED ORDER — POLYETHYLENE GLYCOL 3350 17 G PO PACK
17.0000 g | PACK | Freq: Every day | ORAL | Status: DC
  Administered 2017-05-06 – 2017-05-07 (×2): 17 g
  Filled 2017-05-06 (×2): qty 1

## 2017-05-06 MED ORDER — ATORVASTATIN CALCIUM 40 MG PO TABS
40.0000 mg | ORAL_TABLET | Freq: Every day | ORAL | Status: DC
  Administered 2017-05-06: 40 mg
  Filled 2017-05-06: qty 1

## 2017-05-06 MED ORDER — OLANZAPINE 5 MG PO TABS
5.0000 mg | ORAL_TABLET | Freq: Two times a day (BID) | ORAL | Status: DC
  Administered 2017-05-06 – 2017-05-07 (×3): 5 mg
  Filled 2017-05-06 (×4): qty 1

## 2017-05-06 MED ORDER — CHLORHEXIDINE GLUCONATE 0.12 % MT SOLN: 15.0000 mL | Freq: Four times a day (QID) | OROMUCOSAL | 0 refills | Status: DC

## 2017-05-06 MED ORDER — WARFARIN - PHARMACIST DOSING INPATIENT: Freq: Every day | Status: DC

## 2017-05-06 MED ORDER — CHLORHEXIDINE GLUCONATE CLOTH 2 % EX PADS
6.0000 | MEDICATED_PAD | Freq: Every day | CUTANEOUS | Status: DC
  Administered 2017-05-07: 6 via TOPICAL

## 2017-05-06 MED ORDER — IPRATROPIUM-ALBUTEROL 0.5-2.5 (3) MG/3ML IN SOLN: 3.0000 mL | Freq: Four times a day (QID) | RESPIRATORY_TRACT | Status: DC

## 2017-05-06 MED ORDER — IPRATROPIUM-ALBUTEROL 0.5-2.5 (3) MG/3ML IN SOLN
3.0000 mL | RESPIRATORY_TRACT | Status: DC
  Administered 2017-05-06: 3 mL via RESPIRATORY_TRACT
  Filled 2017-05-06: qty 3

## 2017-05-06 MED ORDER — GUAIFENESIN 200 MG PO TABS
200.0000 mg | ORAL_TABLET | Freq: Three times a day (TID) | ORAL | Status: DC
  Administered 2017-05-06 – 2017-05-07 (×3): 200 mg
  Filled 2017-05-06 (×6): qty 1

## 2017-05-06 MED ORDER — BUSPIRONE HCL 5 MG PO TABS
5.0000 mg | ORAL_TABLET | Freq: Two times a day (BID) | ORAL | Status: DC
  Administered 2017-05-06 – 2017-05-07 (×3): 5 mg via ORAL
  Filled 2017-05-06 (×3): qty 1

## 2017-05-06 MED ORDER — SODIUM CHLORIDE 0.9% FLUSH
3.0000 mL | Freq: Two times a day (BID) | INTRAVENOUS | Status: DC
  Administered 2017-05-06 – 2017-05-07 (×3): 3 mL via INTRAVENOUS

## 2017-05-06 MED ORDER — SODIUM CHLORIDE 0.9% FLUSH: 3.0000 mL | INTRAVENOUS | Status: DC | PRN

## 2017-05-06 MED ORDER — ACETYLCYSTEINE 20 % IN SOLN
3.0000 mL | RESPIRATORY_TRACT | Status: DC
  Administered 2017-05-06: 4 mL via RESPIRATORY_TRACT
  Filled 2017-05-06: qty 4

## 2017-05-06 MED ORDER — SODIUM CHLORIDE 0.9% FLUSH
3.0000 mL | Freq: Two times a day (BID) | INTRAVENOUS | Status: DC
  Administered 2017-05-06 (×2): 3 mL via INTRAVENOUS

## 2017-05-06 MED ORDER — LIDOCAINE 5 % EX PTCH
2.0000 | MEDICATED_PATCH | CUTANEOUS | Status: DC
  Filled 2017-05-06: qty 2

## 2017-05-06 MED ORDER — BACLOFEN 5 MG HALF TABLET
5.0000 mg | ORAL_TABLET | Freq: Three times a day (TID) | ORAL | Status: DC | PRN
  Filled 2017-05-06: qty 2

## 2017-05-06 MED ORDER — PRO-STAT SUGAR FREE PO LIQD
60.0000 mL | Freq: Two times a day (BID) | ORAL | Status: DC
Start: 2017-05-06 — End: 2017-05-07
  Administered 2017-05-06 – 2017-05-07 (×3): 60 mL
  Filled 2017-05-06 (×3): qty 60

## 2017-05-06 MED ORDER — VITAMIN B-12 1000 MCG PO TABS
1000.0000 ug | ORAL_TABLET | Freq: Every day | ORAL | Status: DC
  Administered 2017-05-06 – 2017-05-07 (×2): 1000 ug
  Filled 2017-05-06 (×2): qty 1

## 2017-05-06 NOTE — ED Notes (Signed)
HOB elevated, pt repositioned in bed d/t moving around. Redirectable, but quick to move again.

## 2017-05-06 NOTE — Progress Notes (Signed)
Dr Butler Denmark notified of patient's decrease in O2 Sats with 2L/[place] and 8L Vented mask.  Dr asked to call respiratory for nebulizer treatment then for Respiratory to call her.

## 2017-05-06 NOTE — Discharge Summary (Addendum)
Physician Discharge Summary  Curtis Keller:[id number] DOB: [birth year] DOA: 05/05/2017  PCP: [facility]  Admit date: 05/05/2017 Discharge date: 05/07/2017  Admitted From: SNF Disposition:  SNF   Recommendations for Outpatient Follow-up:  1. Pulse ox BID- give O2 as needed and titrate O2 to keep pulse ox > 90% 2. Keep upright at least 30 degrees to prevent aspiration of tube feeds which should be given slowly.  3. Produces large quantities of mucous and is subject to mucous plugging- may need to be sent to ER for NT suction if hypoxic 4. Give 8 mg of Coumadin tonight and check INR tomorrow AM 5. Sister wants Zyprexa to be stopped. She is a Engineer, civil (consulting) and feels it is oversedating him. I will d/c it.   Discharge Condition:  stable   CODE STATUS:  Full code   Diet recommendation:  See orderes Consultations:  ENT- Dr Suszanne Conners    Discharge Diagnoses:  Principal Problem:   Tracheostomy complication (HCC) Active Problems:   Acute on chronic resp failure due to excess mucous production   MRSA PCR +   Recurrent aspiration pneumonia (HCC)   Heart failure, chronic systolic (HCC)   Hypertension   Depression   Apical mural thrombus   Protein calorie malnutrition (HCC)   S/P splenectomy   PEG (percutaneous endoscopic gastrostomy) status (HCC)   Hyperlipidemia   Chronic anticoagulation   COPD (chronic obstructive pulmonary disease) (HCC)   Diabetes mellitus, type II, insulin dependent (HCC)   Ischemic cardiomyopathy   CKD 3   Hospital Course:  Curtis Keller a 61 y.o.male from [facility] facility with a tracheostomy due to prolonged vent dependence on oxygen via trach, recurrent aspiration pneumonias, dysphagia with PEG tubefeeding, COPD, CVA in February 2017 with residual L hemiparesis,  history of LV apical thrombosis initially on Eliquis thenswitched towarfarin due to CVA , chronic systolic CHF, ICM with EF 25-30 %, CKD 3, recurrent falls & ventral  hernia who is a Full Code.   He had 2 recent admission for aspiration pneumonia from 5/25- 06/07 and again from 04/07/2017 - 04/13/17.  He was sent to the ER this time for a dislodged trach (placed by ENT, Dr Ezzard Standing, in 3/18).  He was evaluated in the ER by ENT, Dr Suszanne Conners, who felt that he may be to manage without the trach. It was recommended that he be monitored over night.    7/14:  Morning>> he is breathing well on 2 L NS with a pulse ox of 91-93%, afebrile, NSR with QTc 430 (has been prolonged in the past), Cr stable.  He is alert, nods and shakes his head to questions, responds to commands and tries to speak but speech is unintelligible. Able to clear his throat quite well. MRSA PCR +- have started Chlorhexidine wipes and Mupirocin  Tongue is quite dry- he appears to be a mouth breather. When asked if tongue hurts, he shakes his head. Starting Peridex. After noon >> called by RN because hypoxic and restless- pulse ox 92% on 10 L Hi flow [place]. NT suctioning performed and large amount of thick mucous suctioned- O2 was weaned down and he was less restless. Had a calm night without respiratory distress.  7/15- AM  -  stable respiratory status- pulse ox 92% on 2 L- mouth breathing - INR 1.58 - on Lovenox until INR therapeutic.  Discharge Instructions   Allergies as of 05/06/2017      Reactions   Codeine Anaphylaxis, Nausea And Vomiting  Lisinopril Itching, Rash   Hydrocodone Other (See Comments)   On MAR   Hydrocodone-acetaminophen Nausea And Vomiting   Cannot take any acetaminophen due to liver issues per sister   Tegaderm Ag Mesh [silver] Other (See Comments)   Allergy to "silver compounds" listed on MAR   Tylenol [acetaminophen] Nausea Only, Other (See Comments)   Cannot take any acetaminophen due to liver issues per sister      Medication List      TAKE these medications   acetylcysteine 20 % nebulizer solution Commonly known as:  MUCOMYST Take 4 mLs by nebulization 2 (two)  times daily.   albuterol (2.5 MG/3ML) 0.083% nebulizer solution Commonly known as:  PROVENTIL Take 3 mLs (2.5 mg total) by nebulization every 4 (four) hours as needed for wheezing or shortness of breath.   atorvastatin 40 MG tablet Commonly known as:  LIPITOR Place 40 mg into feeding tube at bedtime. for Lipids   baclofen 10 MG tablet Commonly known as:  LIORESAL Take 5-10 mg by mouth See admin instructions. Take 5 mg to 10 mg three times a day for spasms   bisacodyl 10 MG suppository Commonly known as:  DULCOLAX Place 1 suppository (10 mg total) rectally daily as needed for moderate constipation.   busPIRone 5 MG tablet Commonly known as:  BUSPAR Take 5 mg by mouth 2 (two) times daily.   chlorhexidine 0.12 % solution Commonly known as:  PERIDEX Use as directed 15 mLs in the mouth or throat 4 (four) times daily.   cyanocobalamin 1000 MCG tablet Place 1 tablet (1,000 mcg total) into feeding tube daily.   enoxaparin 100 MG/ML injection Commonly known as:  LOVENOX Inject 1 mL (100 mg total) into the skin 2 (two) times daily. Continue lovenox bridge until INR therapeutic stable between 2-3, then off   famotidine 20 MG tablet Commonly known as:  PEPCID Place 20 mg into feeding tube 2 (two) times daily. for GERD   feeding supplement (OSMOLITE 1.5 CAL) Liqd Place 1,000 mLs into feeding tube See admin instructions. Run at 80 cc/hr 8 hrs via peg tube. Start at 1600 and stop  @@ 10 am two times a day related to tracheostomy status   feeding supplement (PRO-STAT SUGAR FREE 64) Liqd Place 60 mLs into feeding tube 2 (two) times daily.   Fish Oil 1000 MG Caps Place 1,000 mg into feeding tube daily.   folic acid 1 MG tablet Commonly known as:  FOLVITE Place 1 mg into feeding tube daily.   free water Soln Place 150 mLs into feeding tube every 4 (four) hours.   guaiFENesin 200 MG tablet Place 200 mg into feeding tube 3 (three) times daily. for CHF   ipratropium-albuterol  0.5-2.5 (3) MG/3ML Soln Commonly known as:  DUONEB Take 3 mLs by nebulization 4 (four) times daily.   lidocaine 5 % Commonly known as:  LIDODERM Place 2 patches onto the skin See admin instructions. Apply 2 patches to left knee every morning -remove & discard patch within 12 hours or as directed by MD   metoprolol tartrate 25 MG tablet Commonly known as:  LOPRESSOR Place 25 mg into feeding tube 2 (two) times daily.   multivitamin with minerals Tabs tablet Place 1 tablet into feeding tube daily.   OLANZapine 5 MG tablet Commonly known as:  ZYPREXA Place 5 mg into feeding tube 2 (two) times daily.   polyethylene glycol packet Commonly known as:  MIRALAX / GLYCOLAX Place 17 g into feeding tube daily.  for constipation   senna 8.6 MG Tabs tablet Commonly known as:  SENOKOT Place 1 tablet (8.6 mg total) into feeding tube daily as needed for mild constipation. For constipation   tamsulosin 0.4 MG Caps capsule Commonly known as:  FLOMAX 0.4 mg daily at 6 PM. Give 1 capsule (0.4 mg) via PEG-Tube one time daily   warfarin 6 MG tablet Commonly known as:  COUMADIN Place 1-6 mg into feeding tube daily at 6 PM.       Allergies  Allergen Reactions  . Codeine Anaphylaxis and Nausea And Vomiting  . Lisinopril Itching and Rash  . Hydrocodone Other (See Comments)    On MAR  . Hydrocodone-Acetaminophen Nausea And Vomiting    Cannot take any acetaminophen due to liver issues per sister  . Tegaderm Ag Mesh [Silver] Other (See Comments)    Allergy to "silver compounds" listed on MAR  . Tylenol [Acetaminophen] Nausea Only and Other (See Comments)    Cannot take any acetaminophen due to liver issues per sister     Procedures/Studies:    Ct Abdomen Pelvis Wo Contrast  Result Date: 04/07/2017 CLINICAL DATA:  Anemia history of aortic aneurysm EXAM: CT ABDOMEN AND PELVIS WITHOUT CONTRAST TECHNIQUE: Multidetector CT imaging of the abdomen and pelvis was performed following the standard  protocol without IV contrast. COMPARISON:  01/30/2017 FINDINGS: Lower chest: Patchy bibasilar opacities. No pleural effusion. Cardiomegaly with coronary artery calcification. Calcifications at the ventricular apex may relate to old infarct. Hepatobiliary: No focal hepatic abnormality. Possible small stones or sludge in the gallbladder. No biliary dilatation Pancreas: Unremarkable. No pancreatic ductal dilatation or surrounding inflammatory changes. Spleen: Small splenic tissue in the left upper quadrant with adjacent surgical change or calcification. Adrenals/Urinary Tract: Adrenal glands are within normal limits. Punctate nonobstructing stone in the mid right kidney. No hydronephrosis. The bladder is unremarkable Stomach/Bowel: Stomach is nonenlarged. There is a gastrostomy tube. No dilated small bowel. No colon wall thickening. Vascular/Lymphatic: Atherosclerotic vascular disease. No aneurysmal dilatation. No significantly enlarged lymph nodes. Reproductive: Prostate glands calcifications. Other: Ventral hernia containing mesenteric fat and small amount of bowel. No free air or free fluid Musculoskeletal: Status post left hip replacement with associated artifact. Probable old fracture left inferior pubic ramus. Old right-sided rib fractures. IMPRESSION: 1. Aortic atherosclerotic vascular disease but no aneurysmal dilatation. No evidence for retroperitoneal mass or hematoma. 2. Patchy bibasilar lung opacities, not significantly changed 3. Punctate nonobstructing stone in the mid right kidney 4. Possible small stones or sludge in the gallbladder 5. Ventral hernia containing mesenteric fat and small amount of bowel but no evidence for an obstruction Electronically Signed   By: Jasmine Pang M.D.   On: 04/07/2017 22:43   Dg Chest Port 1 View  Result Date: 05/06/2017 CLINICAL DATA:  Hypoxia EXAM: PORTABLE CHEST 1 VIEW COMPARISON:  04/13/2017 FINDINGS: Mild cardiomegaly. Patchy opacity at the left lung base, similar  to prior study may reflect scarring. No visible effusions or acute bony abnormality appear IMPRESSION: Cardiomegaly. Left basilar opacity is stable since prior study, likely scarring. Electronically Signed   By: Charlett Nose M.D.   On: 05/06/2017 17:08   Dg Chest Port 1 View  Result Date: 04/13/2017 CLINICAL DATA:  Cough and self removal of tracheostomy EXAM: PORTABLE CHEST 1 VIEW COMPARISON:  Chest radiograph 04/10/2017 FINDINGS: Tracheostomy tube is no longer present. Cardiomegaly is unchanged. There is mild pulmonary edema, unchanged from the 04/10/2017 radiograph. No pneumothorax or pleural effusion. IMPRESSION: 1. Removal of tracheostomy tube. 2. Cardiomegaly and  unchanged mild pulmonary edema. Electronically Signed   By: Deatra Robinson M.D.   On: 04/13/2017 22:11   Dg Chest Port 1 View  Result Date: 04/10/2017 CLINICAL DATA:  Shortness of breath EXAM: PORTABLE CHEST 1 VIEW COMPARISON:  04/10/2017 FINDINGS: Tracheostomy tube in place. Bilateral interstitial and alveolar airspace opacities. No pleural effusion or pneumothorax. Stable cardiomegaly. No acute osseous abnormality. IMPRESSION: Mild pulmonary edema. Electronically Signed   By: Elige Ko   On: 04/10/2017 11:51   Dg Chest Port 1 View  Result Date: 04/10/2017 CLINICAL DATA:  Short of breath EXAM: PORTABLE CHEST 1 VIEW COMPARISON:  04/08/2017 FINDINGS: Cardiac enlargement. Progression of bilateral airspace disease most likely due to pulmonary edema. Bibasilar airspace disease with some progression on the right. IMPRESSION: Mild progression of bilateral airspace disease.  Probable edema. Progression of right lower lobe airspace disease. Possible pneumonia Electronically Signed   By: Marlan Palau M.D.   On: 04/10/2017 07:30   Dg Chest Port 1 View  Result Date: 04/08/2017 CLINICAL DATA:  Tracheostomy tube malfunctioning. Initial encounter. EXAM: PORTABLE CHEST 1 VIEW COMPARISON:  Chest radiograph performed earlier today at 6:44 a.m.  FINDINGS: The patient's tracheostomy tube is seen ending 7-8 cm above the carina. It is grossly stable in appearance. Patchy bibasilar airspace opacities raise concern for multifocal pneumonia, redistributed from the prior study, though interstitial edema might have a similar appearance. Underlying vascular congestion is noted. No pleural effusion or pneumothorax is seen, though the left costophrenic angle is incompletely imaged on this study. The cardiomediastinal silhouette is borderline enlarged. No acute osseous abnormalities are seen. There is chronic deformity of the right mid clavicle. IMPRESSION: 1. Tracheostomy tube seen ending 7-8 cm above the carina. It is grossly stable in appearance. 2. Patchy bibasilar airspace opacities raise concern for multifocal pneumonia, redistributed from the recent prior study, though interstitial edema might have a similar appearance. 3. Underlying vascular congestion and borderline cardiomegaly. Electronically Signed   By: [name] M.D.   On: 04/08/2017 21:37   Dg Chest Port 1 View  Result Date: 04/08/2017 CLINICAL DATA:  Sepsis EXAM: PORTABLE CHEST 1 VIEW COMPARISON:  04/07/2017 FINDINGS: Left lower lobe airspace disease unchanged. Mild right upper lobe airspace disease has progressed. Negative for effusion. Tracheostomy remains in good position. IMPRESSION: Left lower lobe airspace disease unchanged. Progression of mild right upper lobe airspace disease. Possible pneumonia. Electronically Signed   By: Marlan Palau M.D.   On: 04/08/2017 09:34       Discharge Exam: Vitals:   05/07/17 1200 05/07/17 1300  BP: (!) 81/62 100/69  Pulse: 80 71  Resp: 20 13  Temp: 98 F (36.7 C)    Vitals:   05/07/17 1100 05/07/17 1130 05/07/17 1200 05/07/17 1300  BP: 98/72  (!) 81/62 100/69  Pulse: 68 79 80 71  Resp: 14 16 20 13   Temp:   98 F (36.7 C)   TempSrc:   Oral   SpO2: 95% 96% 91% 95%  Weight:      Height:        General: Pt is alert, awake, not in  acute distress Cardiovascular: RRR, S1/S2 +, no rubs, no gallops Respiratory: CTA bilaterally, no wheezing, no rhonchi Abdominal: Soft, NT, ND, bowel sounds + Extremities: no edema, no cyanosis    The results of significant diagnostics from this hospitalization (including imaging, microbiology, ancillary and laboratory) are listed below for reference.     Microbiology: Recent Results (from the past 240 hour(s))  MRSA PCR Screening  Status: Abnormal   Collection Time: 05/06/17  9:22 AM  Result Value Ref Range Status   MRSA by PCR POSITIVE (A) NEGATIVE Final    Comment:        The GeneXpert MRSA Assay (FDA approved for NASAL specimens only), is one component of a comprehensive MRSA colonization surveillance program. It is not intended to diagnose MRSA infection nor to guide or monitor treatment for MRSA infections. RESULT CALLED TO, READ BACK BY AND VERIFIED WITH: N.BROWN RN AT 1150 05/06/17 BY A.DAVIS      Labs: BNP (last 3 results)  Recent Labs  12/10/16 1830 03/17/17 1438 03/30/17 0629  BNP 250.0* 601.2* 199.5*   Basic Metabolic Panel:  Recent Labs Lab 05/06/17 0622  NA 140  K 4.1  CL 106  CO2 25  GLUCOSE 98  BUN 38*  CREATININE 1.87*  CALCIUM 9.9   Liver Function Tests:  Recent Labs Lab 05/06/17 0622  AST 36  ALT 33  ALKPHOS 142*  BILITOT 0.7  PROT 7.6  ALBUMIN 2.3*   No results for input(s): LIPASE, AMYLASE in the last 168 hours. No results for input(s): AMMONIA in the last 168 hours. CBC:  Recent Labs Lab 05/06/17 0211 05/06/17 0622  WBC 10.8* 10.3  HGB 13.7 13.1  HCT 41.9 40.3  MCV 97.2 96.0  PLT 247 268   Cardiac Enzymes: No results for input(s): CKTOTAL, CKMB, CKMBINDEX, TROPONINI in the last 168 hours. BNP: Invalid input(s): POCBNP CBG:  Recent Labs Lab 05/06/17 2030 05/07/17 0040 05/07/17 0500 05/07/17 0752 05/07/17 1151  GLUCAP 141* 146* 153* 164* 110*   D-Dimer No results for input(s): DDIMER in the last 72  hours. Hgb A1c No results for input(s): HGBA1C in the last 72 hours. Lipid Profile No results for input(s): CHOL, HDL, LDLCALC, TRIG, CHOLHDL, LDLDIRECT in the last 72 hours. Thyroid function studies No results for input(s): TSH, T4TOTAL, T3FREE, THYROIDAB in the last 72 hours.  Invalid input(s): FREET3 Anemia work up No results for input(s): VITAMINB12, FOLATE, FERRITIN, TIBC, IRON, RETICCTPCT in the last 72 hours. Urinalysis    Component Value Date/Time   COLORURINE YELLOW 04/07/2017 1512   APPEARANCEUR CLEAR 04/07/2017 1512   LABSPEC 1.016 04/07/2017 1512   PHURINE 7.0 04/07/2017 1512   GLUCOSEU NEGATIVE 04/07/2017 1512   HGBUR LARGE (A) 04/07/2017 1512   BILIRUBINUR NEGATIVE 04/07/2017 1512   KETONESUR NEGATIVE 04/07/2017 1512   PROTEINUR 100 (A) 04/07/2017 1512   NITRITE NEGATIVE 04/07/2017 1512   LEUKOCYTESUR NEGATIVE 04/07/2017 1512   Sepsis Labs Invalid input(s): PROCALCITONIN,  WBC,  LACTICIDVEN Microbiology Recent Results (from the past 240 hour(s))  MRSA PCR Screening     Status: Abnormal   Collection Time: 05/06/17  9:22 AM  Result Value Ref Range Status   MRSA by PCR POSITIVE (A) NEGATIVE Final    Comment:        The GeneXpert MRSA Assay (FDA approved for NASAL specimens only), is one component of a comprehensive MRSA colonization surveillance program. It is not intended to diagnose MRSA infection nor to guide or monitor treatment for MRSA infections. RESULT CALLED TO, READ BACK BY AND VERIFIED WITH: N.BROWN RN AT 1150 05/06/17 BY A.DAVIS      Time coordinating discharge: Over 30 minutes  SIGNED:   Calvert Cantor, MD  [facility] 05/07/2017, 1:16 PM Pager   If 7PM-7AM, please contact night-coverage [email or web address] Password [email or web address]

## 2017-05-06 NOTE — Progress Notes (Signed)
ANTICOAGULATION CONSULT NOTE - Initial Consult  Pharmacy Consult for LMWH and Coumadin Indication: h/o stroke/atrial thrombus  Allergies  Allergen Reactions  . Codeine Anaphylaxis and Nausea And Vomiting  . Lisinopril Itching and Rash  . Hydrocodone Other (See Comments)    On MAR  . Hydrocodone-Acetaminophen Nausea And Vomiting    Cannot take any acetaminophen due to liver issues per sister  . Tegaderm Ag Mesh [Silver] Other (See Comments)    Allergy to "silver compounds" listed on MAR  . Tylenol [Acetaminophen] Nausea Only and Other (See Comments)    Cannot take any acetaminophen due to liver issues per sister    Patient Measurements: Height: 6\' 2"  (188 cm) Weight: 217 lb (98.4 kg) IBW/kg (Calculated) : 82.2  Vital Signs: Temp: 98.4 F (36.9 C) (07/13 2002) Temp Source: Oral (07/13 2002) BP: 114/86 (07/14 0030) Pulse Rate: 74 (07/14 0015)   Medical History: Past Medical History:  Diagnosis Date  . Acute respiratory failure (HCC)    a. Spring 2017 following fall/splenectomy -->admitted to [facility]-->trach/g tube.  . Anemia    a. 12/2015 ABL in setting of fall/hematomas/splenic laceration req splenectomy.  Marland Kitchen Anxiety   . Apical mural thrombus    a. 11/2015 Echo: EF 25-30%, moderate size apical thrombus-->coumadin;  b. 12/2015 f/u Echo: EF 25-30%, ant/septa HK, no obvious large thrombus but cannot exclude small mural thrombus.  Marland Kitchen CAD (coronary artery disease)    a. 12/2014 s/p BMS to the RCA.  Marland Kitchen Cerebral infarction (HCC)   . Chronic bronchitis (HCC)    "probably"/sister 04/07/2017  . Chronic systolic CHF (congestive heart failure) (HCC)    a. 12/2015 Echo: EF 25-30%.  . Dementia   . Depression   . Dysphagia   . Falls    a. 11/2015 traumatic fall with resultant trauma req splenectomy and prolonged hospitalization complicated by resp failure.  . Hepatitis C    "never treated" (04/07/2017)  . History of blood transfusion 12/2015; 04/07/2017   "related to spleen  OR; related to low HgB"   . History of gout   . Hyperlipidemia   . Hypertension   . Ischemic cardiomyopathy    a. 12/2015 Echo: EF 25-30%, anterior and septal HK.  Marland Kitchen Left hemiplegia (HCC)   . Myocardial infarction (HCC) 1999; ?2010  . On home oxygen therapy    "28% at the SNF" (6/15/2018Z)  . Pneumonia    "several times since 12/19/2015" (6/15/20180)  . Protein calorie malnutrition (HCC)   . Respiratory failure (HCC)   . Spleen injury   . Status post tracheostomy (HCC)    a. 01/2016 in setting of ongoing resp failure and aspiration.  . Stroke (HCC) 12/19/2015   "left side paralyzed since" (04/07/2017)  . Type II diabetes mellitus (HCC)     Assessment: 61yo male presents from SNF to replace trach, ENT in ED could not replace it, to be observed without trach prior to discharge; to continue Coumadin and LMWH for h/o stroke/atrial thrombus; pt has been on LMWH bridge at SNF.  Goal of Therapy:  INR 2-3 Anti-Xa level 0.6-1 units/ml 4hrs after LMWH dose given Monitor platelets by anticoagulation protocol: Yes   Plan:  Will continue Lovenox 100mg  SQ Q12H (last dose 7/13 1800) and monitor CBC.  Will continue Coumadin 8mg  PT daily and monitor INR.  Vernard Gambles, PharmD, BCPS  05/06/2017,1:02 AM

## 2017-05-06 NOTE — [place] FL2 (Signed)
[place] MEDICAID FL2 LEVEL OF CARE SCREENING TOOL     IDENTIFICATION  Patient Name: Curtis Keller Birthdate: [birth year] Sex: male Admission Date (Current Location): 05/05/2017  [facility] and [place] Number:   Ignacia Palma)   Facility and Address:  [facility], [address] [place], [place] [address]      Provider Number: [id number]  Attending Physician Name and Address:  Calvert Cantor, MD  Relative Name and Phone Number:       Current Level of Care: Hospital Recommended Level of Care: Skilled Nursing Facility Prior Approval Number:    Date Approved/Denied:   PASRR Number: [id number]  Discharge Plan: SNF    Current Diagnoses: Patient Active Problem List   Diagnosis Date Noted  . Recurrent aspiration pneumonia (HCC) 05/06/2017  . Tracheostomy complication (HCC) 05/05/2017  . Complication of tracheostomy tube (HCC)   . Sepsis (HCC) 04/07/2017  . Acute on chronic respiratory failure with hypoxia (HCC)   . SOB (shortness of breath)   . Hypoxia   . Pressure injury of skin 03/20/2017  . HCAP (healthcare-associated pneumonia) 03/18/2017  . Pneumonia 03/17/2017  . 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 Depression with anxiety 01/23/2017  . Encounter for hospice care discussion   . DNR (do not resuscitate)   . Ischemic cardiomyopathy   . Goals of care, counseling/discussion   . Palliative care encounter   . Acute systolic congestive heart failure (HCC)   . Acute and chronic respiratory failure with hypoxia (HCC) 11/28/2016  . Hip fracture (HCC) 11/27/2016  . Closed nondisplaced intertrochanteric fracture of left femur (HCC) 11/27/2016  . Chronic respiratory failure (HCC) 11/27/2016  . Fall at nursing home 07/17/2016  . Diabetes mellitus, type II, insulin dependent (HCC) 06/12/2016  . GERD (gastroesophageal reflux disease) 05/07/2016  . COPD (chronic obstructive pulmonary disease) (HCC) 04/18/2016  . Chronic  anticoagulation 03/22/2016  . S/P splenectomy 03/13/2016  . PEG (percutaneous endoscopic gastrostomy) status (HCC) 03/13/2016  . MRSA (methicillin resistant Staphylococcus aureus) septicemia (HCC) 03/13/2016  . Aspiration pneumonia (HCC) 03/13/2016  . Dysphagia 03/13/2016  . Acute blood loss anemia 03/13/2016  . Hyperlipidemia 03/13/2016  . Heart failure, chronic systolic (HCC)   . Hypertension   . Depression   . Cardiomyopathy (HCC)   . Apical mural thrombus   . Protein calorie malnutrition (HCC)   . Intraparenchymal hematoma of brain due to trauma [facility])     Orientation RESPIRATION BLADDER Height & Weight     Self  Other (Comment) (see DC summary) External catheter Weight: 217 lb (98.4 kg) Height:  6\' 2"  (188 cm)  BEHAVIORAL SYMPTOMS/MOOD NEUROLOGICAL BOWEL NUTRITION STATUS      Incontinent Feeding tube  AMBULATORY STATUS COMMUNICATION OF NEEDS Skin   Total Care Verbally Normal                       Personal Care Assistance Level of Assistance  Bathing, Dressing Bathing Assistance: Maximum assistance   Dressing Assistance: Maximum assistance     Functional Limitations Info             SPECIAL CARE FACTORS FREQUENCY  PT (By licensed PT), OT (By licensed OT)     PT Frequency: 5x/wk OT Frequency: 5x/wk            Contractures      Additional Factors Info  Code Status, Allergies, Psychotropic, Insulin Sliding Scale, Isolation Precautions Code Status Info: full Allergies Info: Codeine, Lisinopril,  Hydrocodone, Hydrocodone-acetaminophen, Tegaderm Ag Mesh Silver, Tylenol Acetaminophen Psychotropic Info: Buspar 5mg , Zyprexa 5mg  Insulin Sliding Scale Info: 3x/day Isolation Precautions Info: MRSA     Current Medications (05/06/2017):  This is the current hospital active medication list Current Facility-Administered Medications  Medication Dose Route Frequency Provider Last Rate Last Dose  . 0.9 %  sodium chloride infusion  250 mL Intravenous PRN Pearson Grippe, MD      . acetylcysteine (MUCOMYST) 20 % nebulizer / oral solution 600 mg  600 mg Oral Q4H Rizwan, Saima, MD      . albuterol (PROVENTIL) (2.5 MG/3ML) 0.083% nebulizer solution 2.5 mg  2.5 mg Nebulization Q4H PRN Pearson Grippe, MD      . atorvastatin (LIPITOR) tablet 40 mg  40 mg Per Tube QHS Pearson Grippe, MD      . baclofen (LIORESAL) tablet 5-10 mg  5-10 mg Oral TID PRN Pearson Grippe, MD      . bisacodyl (DULCOLAX) suppository 10 mg  10 mg Rectal Daily PRN Pearson Grippe, MD      . busPIRone (BUSPAR) tablet 5 mg  5 mg Oral BID Pearson Grippe, MD   5 mg at 05/06/17 1015  . chlorhexidine (PERIDEX) 0.12 % solution 15 mL  15 mL Mouth/Throat QID Calvert Cantor, MD   15 mL at 05/06/17 1015  . enoxaparin (LOVENOX) injection 100 mg  100 mg Subcutaneous Q12H Vernard Gambles P, RPH   100 mg at 05/06/17 1200  . famotidine (PEPCID) tablet 20 mg  20 mg Per Tube BID Pearson Grippe, MD   20 mg at 05/06/17 1015  . feeding supplement (OSMOLITE 1.5 CAL) liquid 640 mL  640 mL Per Tube Q24H Pearson Grippe, MD      . feeding supplement (PRO-STAT SUGAR FREE 64) liquid 60 mL  60 mL Per Tube BID Pearson Grippe, MD   60 mL at 05/06/17 1016  . folic acid (FOLVITE) tablet 1 mg  1 mg Per Tube Daily Pearson Grippe, MD   1 mg at 05/06/17 1022  . free water 150 mL  150 mL Per Tube Q4H Pearson Grippe, MD   150 mL at 05/06/17 1016  . guaiFENesin tablet 200 mg  200 mg Per Tube TID Pearson Grippe, MD   200 mg at 05/06/17 1016  . insulin aspart (novoLOG) injection 0-9 Units  0-9 Units Subcutaneous TID WC Pearson Grippe, MD      . ipratropium-albuterol (DUONEB) 0.5-2.5 (3) MG/3ML nebulizer solution 3 mL  3 mL Nebulization QID Pearson Grippe, MD   3 mL at 05/06/17 1540  . lidocaine (LIDODERM) 5 % 2 patch  2 patch Transdermal See admin instructions Pearson Grippe, MD      . metoprolol tartrate (LOPRESSOR) tablet 25 mg  25 mg Per Tube BID Pearson Grippe, MD   25 mg at 05/06/17 1015  . multivitamin with minerals tablet 1 tablet  1 tablet Per Tube Daily Pearson Grippe, MD   1 tablet at  05/06/17 1015  . OLANZapine (ZYPREXA) tablet 5 mg  5 mg Per Tube BID Pearson Grippe, MD   5 mg at 05/06/17 1015  . polyethylene glycol (MIRALAX / GLYCOLAX) packet 17 g  17 g Per Tube Daily Pearson Grippe, MD   17 g at 05/06/17 1014  . senna (SENOKOT) tablet 8.6 mg  1 tablet Per Tube Daily PRN Pearson Grippe, MD      . sodium chloride flush (NS) 0.9 % injection 3 mL  3 mL Intravenous Q12H Pearson Grippe, MD  3 mL at 05/06/17 1022  . sodium chloride flush (NS) 0.9 % injection 3 mL  3 mL Intravenous Q12H Pearson Grippe, MD   3 mL at 05/06/17 1022  . sodium chloride flush (NS) 0.9 % injection 3 mL  3 mL Intravenous PRN Pearson Grippe, MD      . tamsulosin [facility]) capsule 0.4 mg  0.4 mg Oral Daily Pearson Grippe, MD   0.4 mg at 05/06/17 1021  . vitamin B-12 (CYANOCOBALAMIN) tablet 1,000 mcg  1,000 mcg Per Tube Daily Pearson Grippe, MD   1,000 mcg at 05/06/17 1014  . warfarin (COUMADIN) tablet 8 mg  8 mg Oral q1800 Juliette Mangle, RPH      . Warfarin - Pharmacist Dosing Inpatient   Does not apply q1800 Juliette Mangle, [facility]         Discharge Medications: Please see discharge summary for a list of discharge medications.  Relevant Imaging Results:  Relevant Lab Results:   Additional Information SS#: [id number]  Baldemar Lenis, LCSW

## 2017-05-06 NOTE — Progress Notes (Signed)
RT note- Patient suctioned for large amount thick green secretions, MD aware, mucomyst ordered, sp02 improved. Continue to monitor.

## 2017-05-06 NOTE — Progress Notes (Signed)
Call to respiratory for treatment and follow up with Dr Butler Denmark.

## 2017-05-06 NOTE — Progress Notes (Addendum)
[facility] Hospital[facility] note  Called for hypoxia. Has a h/o aspiration pneumonias and mucous plugging. I ordered a CXR and I went to evaluate the patient. He appeared comfortable on 10 L high flow cannula. Did not follow commands when asked to take a deep breath. I asked RT to do NT suction. Large amounts of tan mucous suctioned out. Have ordered NT suction PRN and Mucomyst nebs Q 4 hrs.   Calvert Cantor, MD Time: 4:35 PM- 4:50 PM

## 2017-05-06 NOTE — ED Notes (Signed)
Pt was incontinent and was cleaned and placed in clean dry brief

## 2017-05-06 NOTE — Care Management Note (Signed)
Case Management Note  Patient Details  Name: Curtis Keller MRN: [id number] Date of Birth: January 16, 1956  Subjective/Objective:                 Patient [facility] will return there at DC today as facilitated by CSW.    Action/Plan:   Expected Discharge Date:  05/06/17               Expected Discharge Plan:  Skilled Nursing Facility  In-House Referral:  Clinical Social Work  Discharge planning Services  CM Consult  Post Acute Care Choice:    Choice offered to:     DME Arranged:    DME Agency:     HH Arranged:    HH Agency:     Status of Service:  Completed, signed off  If discussed at [employer], dates discussed:    Additional Comments:  Lawerance Sabal, RN 05/06/2017, 10:25 AM

## 2017-05-06 NOTE — Progress Notes (Signed)
Call to Marisue Ivan, [facility] for update about SNF placement and current condition.  Marisue Ivan to call back with update.

## 2017-05-06 NOTE — Progress Notes (Signed)
CSW contacted by RN with information on medical condition that could effect discharge for today. RN indicated that the MD reported patient not medically stable for discharge today. CSW to complete FL2 and coordinate discharge when patient is medically stable.  Blenda Nicely, [place] Clinical Social Worker [phone]

## 2017-05-06 NOTE — Progress Notes (Signed)
RT called by RN for patient desat and treatment. When RT arrived pt had been placed on venturi mask. RT administered scheduled treatment and protocol assessment. Pt currently has 02 sat of 91%. Will continue to monitor.

## 2017-05-06 NOTE — Progress Notes (Signed)
Patient now more relaxed and calm after naso suctioned, cleaned up, repositioned in the bed. Lung sounds better.

## 2017-05-07 DIAGNOSIS — J80 Acute respiratory distress syndrome: Secondary | ICD-10-CM | POA: Diagnosis not present

## 2017-05-07 DIAGNOSIS — F0390 Unspecified dementia without behavioral disturbance: Secondary | ICD-10-CM | POA: Diagnosis not present

## 2017-05-07 DIAGNOSIS — T17300A Unspecified foreign body in larynx causing asphyxiation, initial encounter: Secondary | ICD-10-CM | POA: Diagnosis not present

## 2017-05-07 DIAGNOSIS — I639 Cerebral infarction, unspecified: Secondary | ICD-10-CM | POA: Diagnosis not present

## 2017-05-07 DIAGNOSIS — R41841 Cognitive communication deficit: Secondary | ICD-10-CM | POA: Diagnosis not present

## 2017-05-07 DIAGNOSIS — R1312 Dysphagia, oropharyngeal phase: Secondary | ICD-10-CM | POA: Diagnosis not present

## 2017-05-07 DIAGNOSIS — J189 Pneumonia, unspecified organism: Secondary | ICD-10-CM | POA: Diagnosis not present

## 2017-05-07 DIAGNOSIS — F802 Mixed receptive-expressive language disorder: Secondary | ICD-10-CM | POA: Diagnosis not present

## 2017-05-07 DIAGNOSIS — M24542 Contracture, left hand: Secondary | ICD-10-CM | POA: Diagnosis not present

## 2017-05-07 DIAGNOSIS — R7989 Other specified abnormal findings of blood chemistry: Secondary | ICD-10-CM | POA: Diagnosis not present

## 2017-05-07 DIAGNOSIS — R0902 Hypoxemia: Secondary | ICD-10-CM | POA: Diagnosis not present

## 2017-05-07 DIAGNOSIS — I213 ST elevation (STEMI) myocardial infarction of unspecified site: Secondary | ICD-10-CM | POA: Diagnosis not present

## 2017-05-07 DIAGNOSIS — Z430 Encounter for attention to tracheostomy: Secondary | ICD-10-CM | POA: Diagnosis not present

## 2017-05-07 DIAGNOSIS — I2510 Atherosclerotic heart disease of native coronary artery without angina pectoris: Secondary | ICD-10-CM | POA: Diagnosis not present

## 2017-05-07 DIAGNOSIS — I509 Heart failure, unspecified: Secondary | ICD-10-CM | POA: Diagnosis not present

## 2017-05-07 DIAGNOSIS — I10 Essential (primary) hypertension: Secondary | ICD-10-CM | POA: Diagnosis not present

## 2017-05-07 DIAGNOSIS — I69154 Hemiplegia and hemiparesis following nontraumatic intracerebral hemorrhage affecting left non-dominant side: Secondary | ICD-10-CM | POA: Diagnosis not present

## 2017-05-07 DIAGNOSIS — G8114 Spastic hemiplegia affecting left nondominant side: Secondary | ICD-10-CM | POA: Diagnosis not present

## 2017-05-07 DIAGNOSIS — L89159 Pressure ulcer of sacral region, unspecified stage: Secondary | ICD-10-CM | POA: Diagnosis not present

## 2017-05-07 DIAGNOSIS — M24532 Contracture, left wrist: Secondary | ICD-10-CM | POA: Diagnosis not present

## 2017-05-07 DIAGNOSIS — N189 Chronic kidney disease, unspecified: Secondary | ICD-10-CM | POA: Diagnosis not present

## 2017-05-07 DIAGNOSIS — F0280 Dementia in other diseases classified elsewhere without behavioral disturbance: Secondary | ICD-10-CM | POA: Diagnosis not present

## 2017-05-07 DIAGNOSIS — J9620 Acute and chronic respiratory failure, unspecified whether with hypoxia or hypercapnia: Secondary | ICD-10-CM | POA: Diagnosis not present

## 2017-05-07 DIAGNOSIS — F419 Anxiety disorder, unspecified: Secondary | ICD-10-CM | POA: Diagnosis not present

## 2017-05-07 DIAGNOSIS — I5022 Chronic systolic (congestive) heart failure: Secondary | ICD-10-CM | POA: Diagnosis not present

## 2017-05-07 DIAGNOSIS — I236 Thrombosis of atrium, auricular appendage, and ventricle as current complications following acute myocardial infarction: Secondary | ICD-10-CM | POA: Diagnosis not present

## 2017-05-07 DIAGNOSIS — F329 Major depressive disorder, single episode, unspecified: Secondary | ICD-10-CM | POA: Diagnosis not present

## 2017-05-07 DIAGNOSIS — J449 Chronic obstructive pulmonary disease, unspecified: Secondary | ICD-10-CM | POA: Diagnosis not present

## 2017-05-07 DIAGNOSIS — M24562 Contracture, left knee: Secondary | ICD-10-CM | POA: Diagnosis not present

## 2017-05-07 DIAGNOSIS — E119 Type 2 diabetes mellitus without complications: Secondary | ICD-10-CM | POA: Diagnosis not present

## 2017-05-07 DIAGNOSIS — K219 Gastro-esophageal reflux disease without esophagitis: Secondary | ICD-10-CM | POA: Diagnosis not present

## 2017-05-07 DIAGNOSIS — N183 Chronic kidney disease, stage 3 (moderate): Secondary | ICD-10-CM | POA: Diagnosis not present

## 2017-05-07 DIAGNOSIS — N400 Enlarged prostate without lower urinary tract symptoms: Secondary | ICD-10-CM | POA: Diagnosis not present

## 2017-05-07 DIAGNOSIS — Z794 Long term (current) use of insulin: Secondary | ICD-10-CM | POA: Diagnosis not present

## 2017-05-07 DIAGNOSIS — R1310 Dysphagia, unspecified: Secondary | ICD-10-CM | POA: Diagnosis not present

## 2017-05-07 DIAGNOSIS — M6281 Muscle weakness (generalized): Secondary | ICD-10-CM | POA: Diagnosis not present

## 2017-05-07 DIAGNOSIS — E785 Hyperlipidemia, unspecified: Secondary | ICD-10-CM | POA: Diagnosis not present

## 2017-05-07 DIAGNOSIS — N179 Acute kidney failure, unspecified: Secondary | ICD-10-CM | POA: Diagnosis not present

## 2017-05-07 DIAGNOSIS — J9600 Acute respiratory failure, unspecified whether with hypoxia or hypercapnia: Secondary | ICD-10-CM | POA: Diagnosis not present

## 2017-05-07 DIAGNOSIS — J9503 Malfunction of tracheostomy stoma: Secondary | ICD-10-CM | POA: Diagnosis not present

## 2017-05-07 DIAGNOSIS — B1920 Unspecified viral hepatitis C without hepatic coma: Secondary | ICD-10-CM | POA: Diagnosis not present

## 2017-05-07 DIAGNOSIS — J9610 Chronic respiratory failure, unspecified whether with hypoxia or hypercapnia: Secondary | ICD-10-CM | POA: Diagnosis not present

## 2017-05-07 DIAGNOSIS — E46 Unspecified protein-calorie malnutrition: Secondary | ICD-10-CM | POA: Diagnosis not present

## 2017-05-07 DIAGNOSIS — F39 Unspecified mood [affective] disorder: Secondary | ICD-10-CM | POA: Diagnosis not present

## 2017-05-07 DIAGNOSIS — Z431 Encounter for attention to gastrostomy: Secondary | ICD-10-CM | POA: Diagnosis not present

## 2017-05-07 LAB — GLUCOSE, CAPILLARY
GLUCOSE-CAPILLARY: 110 mg/dL — AB (ref 65–99)
GLUCOSE-CAPILLARY: 146 mg/dL — AB (ref 65–99)
GLUCOSE-CAPILLARY: 153 mg/dL — AB (ref 65–99)
GLUCOSE-CAPILLARY: 164 mg/dL — AB (ref 65–99)
Glucose-Capillary: 126 mg/dL — ABNORMAL HIGH (ref 65–99)

## 2017-05-07 LAB — PROTIME-INR
INR: 1.58
PROTHROMBIN TIME: 19.1 s — AB (ref 11.4–15.2)

## 2017-05-07 MED ORDER — WARFARIN SODIUM 4 MG PO TABS: 8.0000 mg | ORAL_TABLET | Freq: Once | ORAL | 0 refills | Status: DC

## 2017-05-07 MED ORDER — CHLORHEXIDINE GLUCONATE CLOTH 2 % EX PADS: 6.0000 | MEDICATED_PAD | Freq: Every day | CUTANEOUS | 0 refills | Status: DC

## 2017-05-07 MED ORDER — IPRATROPIUM-ALBUTEROL 0.5-2.5 (3) MG/3ML IN SOLN
3.0000 mL | Freq: Three times a day (TID) | RESPIRATORY_TRACT | Status: DC
  Administered 2017-05-07 (×2): 3 mL via RESPIRATORY_TRACT
  Filled 2017-05-07 (×2): qty 3

## 2017-05-07 MED ORDER — ACETYLCYSTEINE 20 % IN SOLN
3.0000 mL | Freq: Three times a day (TID) | RESPIRATORY_TRACT | Status: DC
  Administered 2017-05-07: 4 mL via RESPIRATORY_TRACT
  Administered 2017-05-07: 3 mL via RESPIRATORY_TRACT
  Filled 2017-05-07 (×2): qty 4

## 2017-05-07 MED ORDER — MUPIROCIN 2 % EX OINT
1.0000 | TOPICAL_OINTMENT | Freq: Two times a day (BID) | CUTANEOUS | 0 refills | Status: DC
Start: 2017-05-07 — End: 2017-09-20

## 2017-05-07 MED ORDER — WARFARIN SODIUM 4 MG PO TABS
8.0000 mg | ORAL_TABLET | Freq: Every day | ORAL | Status: DC
  Filled 2017-05-07: qty 2

## 2017-05-07 MED ORDER — LIDOCAINE 5 % EX PTCH
2.0000 | MEDICATED_PATCH | CUTANEOUS | Status: DC
  Administered 2017-05-07: 2 via TRANSDERMAL
  Filled 2017-05-07: qty 2

## 2017-05-07 NOTE — Progress Notes (Signed)
CSW following for DC planning. Pt from [facility]. Spoke with facility- prepared to accept pt back today. All information provided to facility via the HUB. Spoke with pt's sister to advise of DC/transfer.   Pt will transport via [facility]- CSW completed medical necessity form and arranged transport.  RN Report # [phone].  Ilean Skill, MSW, LCSW Clinical Social Work 05/07/2017 Weekend coverage [phone]

## 2017-05-07 NOTE — Progress Notes (Signed)
ANTICOAGULATION CONSULT NOTE - Follow up  Pharmacy Consult for LMWH and Coumadin Indication: h/o stroke/atrial thrombus  Allergies  Allergen Reactions  . Codeine Anaphylaxis and Nausea And Vomiting  . Lisinopril Itching and Rash  . Hydrocodone Other (See Comments)    On MAR  . Hydrocodone-Acetaminophen Nausea And Vomiting    Cannot take any acetaminophen due to liver issues per sister  . Tegaderm Ag Mesh [Silver] Other (See Comments)    Allergy to "silver compounds" listed on MAR  . Tylenol [Acetaminophen] Nausea Only and Other (See Comments)    Cannot take any acetaminophen due to liver issues per sister    Patient Measurements: Height: 6\' 2"  (188 cm) Weight: 216 lb 4.3 oz (98.1 kg) IBW/kg (Calculated) : 82.2  Vital Signs: Temp: 97.6 F (36.4 C) (07/15 0800) Temp Source: Oral (07/15 0800) BP: 98/72 (07/15 1100) Pulse Rate: 79 (07/15 1130)   Medical History: Past Medical History:  Diagnosis Date  . Acute respiratory failure (HCC)    a. Spring 2017 following fall/splenectomy -->admitted to [facility]-->trach/g tube.  . Anemia    a. 12/2015 ABL in setting of fall/hematomas/splenic laceration req splenectomy.  Marland Kitchen Anxiety   . Apical mural thrombus    a. 11/2015 Echo: EF 25-30%, moderate size apical thrombus-->coumadin;  b. 12/2015 f/u Echo: EF 25-30%, ant/septa HK, no obvious large thrombus but cannot exclude small mural thrombus.  Marland Kitchen CAD (coronary artery disease)    a. 12/2014 s/p BMS to the RCA.  Marland Kitchen Cerebral infarction (HCC)   . Chronic bronchitis (HCC)    "probably"/sister 04/07/2017  . Chronic systolic CHF (congestive heart failure) (HCC)    a. 12/2015 Echo: EF 25-30%.  . Dementia   . Depression   . Dysphagia   . Falls    a. 11/2015 traumatic fall with resultant trauma req splenectomy and prolonged hospitalization complicated by resp failure.  . Hepatitis C    "never treated" (04/07/2017)  . History of blood transfusion 12/2015; 04/07/2017   "related to spleen  OR; related to low HgB"   . History of gout   . Hyperlipidemia   . Hypertension   . Ischemic cardiomyopathy    a. 12/2015 Echo: EF 25-30%, anterior and septal HK.  Marland Kitchen Left hemiplegia (HCC)   . Myocardial infarction (HCC) 1999; ?2010  . On home oxygen therapy    "28% at the SNF" (6/15/2018Z)  . Pneumonia    "several times since 12/19/2015" (6/15/20180)  . Protein calorie malnutrition (HCC)   . Respiratory failure (HCC)   . Spleen injury   . Status post tracheostomy (HCC)    a. 01/2016 in setting of ongoing resp failure and aspiration.  . Stroke (HCC) 12/19/2015   "left side paralyzed since" (04/07/2017)  . Type II diabetes mellitus (HCC)     Assessment: 61yo male presents from SNF to replace trach, ENT in ED could not replace it, to be observed without trach prior to discharge; to continue Coumadin per Tube and LMWH for h/o stroke/atrial thrombus; pt has been on LMWH bridge at SNF.  Per pta med hx: Lovenox  started 04/13/17 at SNF per MAR .   INR was subtherapeutic on admit  [facility] on Coumadin  dose 1-6 mg per TUBE @ SNF: Took 1 mg 04/24/17-05/03/17 took 2 mg 07/12 and 07/13 in addition took 6 mg 07/12/ and 07/13 it was taken at 1800 (=8mg  per tube daily since 7/12)  and INR was SUBtherapeutic on admit 7/13 INR 1.58 today 05/07/17, INR trending up  on 8mg  per tube daily Wt =98kg, Scr 1.87, crcl 47 ml/min , LMWH 1mg /kg q12h dose okay No bleeding noted.  Goal of Therapy:  INR 2-3 Anti-Xa level 0.6-1 units/ml 4hrs after LMWH dose given Monitor platelets by anticoagulation protocol: Yes   Plan: Lovenox 100mg  SQ Q12H bridge  continue Coumadin 8mg  per Tube daily Daily  INR Monitor cbc, renal fxn & adjust lmwh if needed  Noah Delaine, RPh Clinical Pharmacist Pager: [phone]-[phone] Main Pharmacy [phone] 05/07/2017,11:58 AM

## 2017-05-07 NOTE — Progress Notes (Signed)
[facility]  I have examined the patient, discussed the plan with the RN and feel that he is ready for discharge to SNF today. Please the d/c summary from 7/14 which I have updated today.   Calvert Cantor, MD Pager: [email or web address]

## 2017-05-07 NOTE — Progress Notes (Signed)
Report called to [facility]. S/W Alethia Berthold. Barbera Setters RN

## 2017-05-07 NOTE — Progress Notes (Signed)
RT note- patient suctioned post nebulizer for thick green secretions

## 2017-05-08 DIAGNOSIS — G8114 Spastic hemiplegia affecting left nondominant side: Secondary | ICD-10-CM | POA: Diagnosis not present

## 2017-05-08 LAB — GLUCOSE, CAPILLARY: GLUCOSE-CAPILLARY: 135 mg/dL — AB (ref 65–99)

## 2017-05-09 LAB — HEMOGLOBIN A1C
Hgb A1c MFr Bld: 6 % — ABNORMAL HIGH (ref 4.8–5.6)
Mean Plasma Glucose: 126 mg/dL

## 2017-05-10 DIAGNOSIS — F419 Anxiety disorder, unspecified: Secondary | ICD-10-CM | POA: Diagnosis not present

## 2017-05-10 DIAGNOSIS — F329 Major depressive disorder, single episode, unspecified: Secondary | ICD-10-CM | POA: Diagnosis not present

## 2017-05-10 DIAGNOSIS — F39 Unspecified mood [affective] disorder: Secondary | ICD-10-CM | POA: Diagnosis not present

## 2017-05-10 DIAGNOSIS — F0390 Unspecified dementia without behavioral disturbance: Secondary | ICD-10-CM | POA: Diagnosis not present

## 2017-05-11 DIAGNOSIS — G8114 Spastic hemiplegia affecting left nondominant side: Secondary | ICD-10-CM | POA: Diagnosis not present

## 2017-05-11 DIAGNOSIS — J449 Chronic obstructive pulmonary disease, unspecified: Secondary | ICD-10-CM | POA: Diagnosis not present

## 2017-05-11 DIAGNOSIS — I236 Thrombosis of atrium, auricular appendage, and ventricle as current complications following acute myocardial infarction: Secondary | ICD-10-CM | POA: Diagnosis not present

## 2017-05-11 DIAGNOSIS — K219 Gastro-esophageal reflux disease without esophagitis: Secondary | ICD-10-CM | POA: Diagnosis not present

## 2017-05-11 DIAGNOSIS — I213 ST elevation (STEMI) myocardial infarction of unspecified site: Secondary | ICD-10-CM | POA: Diagnosis not present

## 2017-05-14 DIAGNOSIS — E46 Unspecified protein-calorie malnutrition: Secondary | ICD-10-CM | POA: Diagnosis not present

## 2017-05-14 DIAGNOSIS — E119 Type 2 diabetes mellitus without complications: Secondary | ICD-10-CM | POA: Diagnosis not present

## 2017-05-14 DIAGNOSIS — F329 Major depressive disorder, single episode, unspecified: Secondary | ICD-10-CM | POA: Diagnosis not present

## 2017-05-16 DIAGNOSIS — G8114 Spastic hemiplegia affecting left nondominant side: Secondary | ICD-10-CM | POA: Diagnosis not present

## 2017-05-17 ENCOUNTER — Inpatient Hospital Stay (HOSPITAL_COMMUNITY): Admission: RE | Admit: 2017-05-17 | Payer: Medicare Other | Source: Ambulatory Visit

## 2017-05-18 ENCOUNTER — Other Ambulatory Visit (HOSPITAL_COMMUNITY): Payer: Self-pay | Admitting: Internal Medicine

## 2017-05-18 DIAGNOSIS — R1319 Other dysphagia: Secondary | ICD-10-CM

## 2017-05-18 NOTE — Progress Notes (Signed)
PT eval addendum- added G-codes    04/18/2017 1038  PT G-Codes **NOT FOR INPATIENT CLASS**  Functional Assessment Tool Used Clinical judgement  Functional Limitation Mobility: Walking and moving around  Mobility: Walking and Moving Around Current Status (J8563) CM  Mobility: Walking and Moving Around Goal Status (J4970) CL   Mylo Red, PT, [name] [phone]

## 2017-05-18 NOTE — Progress Notes (Signed)
Late entry due to missed g code.   16-Jun-2017 1500  OT G-codes **NOT FOR INPATIENT CLASS**  Functional Assessment Tool Used Clinical judgement  Functional Limitation Self care  Self Care Current Status [phone]) CN  Self Care Goal Status (J0932) CN  Self Care Discharge Status [phone]) CN  06-16-17 Martie Round, OTR/L Pager: [phone]

## 2017-05-19 DIAGNOSIS — G8114 Spastic hemiplegia affecting left nondominant side: Secondary | ICD-10-CM | POA: Diagnosis not present

## 2017-05-19 NOTE — Progress Notes (Signed)
   04/10/17 1230  SLP G-Codes **NOT FOR INPATIENT CLASS**  Functional Assessment Tool Used skilled clinical judgment  Functional Limitations Voice  Voice Current Status (G9171) CK  Voice Goal Status (G9172) CK  SLP Evaluations  $ SLP Speech Visit 1 Procedure  SLP Evaluations  $$ Passy Muir Speaking Valve yes  $ SLP Eval Voice Prosthetic Device Procedure

## 2017-05-19 NOTE — Progress Notes (Signed)
   04/10/17 1200  SLP G-Codes **NOT FOR INPATIENT CLASS**  Functional Assessment Tool Used skilled clinical judgment  Functional Limitations Swallowing  Swallow Current Status (U0370) CL  Swallow Goal Status (D6438) CL  SLP Evaluations  $ SLP Speech Visit 1 Procedure  SLP Evaluations  $MBS Swallow 1 Procedure  $Swallowing Treatment 1 Procedure

## 2017-05-22 ENCOUNTER — Ambulatory Visit (HOSPITAL_COMMUNITY)
Admission: RE | Admit: 2017-05-22 | Discharge: 2017-05-22 | Disposition: A | Discharge status: Home / Self Care | Payer: No Typology Code available for payment source | Source: Ambulatory Visit | Attending: Internal Medicine | Admitting: Internal Medicine

## 2017-05-22 DIAGNOSIS — R1310 Dysphagia, unspecified: Secondary | ICD-10-CM | POA: Insufficient documentation

## 2017-05-22 DIAGNOSIS — J9610 Chronic respiratory failure, unspecified whether with hypoxia or hypercapnia: Secondary | ICD-10-CM | POA: Insufficient documentation

## 2017-05-22 DIAGNOSIS — R1319 Other dysphagia: Secondary | ICD-10-CM

## 2017-05-22 DIAGNOSIS — R1312 Dysphagia, oropharyngeal phase: Secondary | ICD-10-CM | POA: Insufficient documentation

## 2017-05-22 DIAGNOSIS — T17300A Unspecified foreign body in larynx causing asphyxiation, initial encounter: Secondary | ICD-10-CM | POA: Diagnosis not present

## 2017-05-22 NOTE — Progress Notes (Signed)
Objective Swallowing Evaluation: Type of Study: MBS-Modified Barium Swallow Study  Patient Details  Name: Curtis Keller MRN: [id number] Date of Birth: 07/11/1956  Today's Date: 05/22/2017 Time: SLP Start Time (ACUTE ONLY): 1130-SLP Stop Time (ACUTE ONLY): 1150 SLP Time Calculation (min) (ACUTE ONLY): 20 min  Past Medical History:  Past Medical History:  Diagnosis Date  . Acute respiratory failure (HCC)    a. Spring 2017 following fall/splenectomy -->admitted to [facility]-->trach/g tube.  . Anemia    a. 12/2015 ABL in setting of fall/hematomas/splenic laceration req splenectomy.  Marland Kitchen Anxiety   . Apical mural thrombus    a. 11/2015 Echo: EF 25-30%, moderate size apical thrombus-->coumadin;  b. 12/2015 f/u Echo: EF 25-30%, ant/septa HK, no obvious large thrombus but cannot exclude small mural thrombus.  Marland Kitchen CAD (coronary artery disease)    a. 12/2014 s/p BMS to the RCA.  Marland Kitchen Cerebral infarction (HCC)   . Chronic bronchitis (HCC)    "probably"/sister 04/07/2017  . Chronic systolic CHF (congestive heart failure) (HCC)    a. 12/2015 Echo: EF 25-30%.  . Dementia   . Depression   . Dysphagia   . Falls    a. 11/2015 traumatic fall with resultant trauma req splenectomy and prolonged hospitalization complicated by resp failure.  . Hepatitis C    "never treated" (04/07/2017)  . History of blood transfusion 12/2015; 04/07/2017   "related to spleen OR; related to low HgB"   . History of gout   . Hyperlipidemia   . Hypertension   . Ischemic cardiomyopathy    a. 12/2015 Echo: EF 25-30%, anterior and septal HK.  Marland Kitchen Left hemiplegia (HCC)   . Myocardial infarction (HCC) 1999; ?2010  . On home oxygen therapy    "28% at the SNF" (6/15/2018Z)  . Pneumonia    "several times since 12/19/2015" (6/15/20180)  . Protein calorie malnutrition (HCC)   . Respiratory failure (HCC)   . Spleen injury   . Status post tracheostomy (HCC)    a. 01/2016 in setting of ongoing resp failure and aspiration.  .  Stroke (HCC) 12/19/2015   "left side paralyzed since" (04/07/2017)  . Type II diabetes mellitus (HCC)    Past Surgical History:  Past Surgical History:  Procedure Laterality Date  . ABDOMINAL HERNIA REPAIR    . CARDIAC CATHETERIZATION    . CORONARY ANGIOPLASTY WITH STENT PLACEMENT  1999   "[facility]"  . HERNIA REPAIR    . HIP ARTHROPLASTY Left 02/10/2017   Procedure: LEFT HIP HEMIARTHROPLASTY;  Surgeon: [name], MD;  Location: [facility] OR;  Service: Orthopedics;  Laterality: Left;  . IR GASTROSTOMY TUBE MOD SED  02/02/2017  . IR GENERIC HISTORICAL  07/22/2016   IR GASTROSTOMY TUBE REMOVAL 07/22/2016 [name], MD [facility]-INTERV RAD  . PR LAP, SPLENECTOMY  01/07/2016  . TRACHEOSTOMY TUBE PLACEMENT N/A 02/22/2016   Procedure: TRACHEOSTOMY;  Surgeon: [name], MD;  Location: [facility] OR;  Service: ENT;  Laterality: N/A;  . TRACHEOSTOMY TUBE PLACEMENT N/A 01/14/2017   Procedure: TRACHEOSTOMY;  Surgeon: [name], MD;  Location: [facility] OR;  Service: ENT;  Laterality: N/A;  inserted #[id number] KZS#[id number]   HPI: Pt is a 61 year old male with a history of respiratory failure, chronic illness, and dysphagia. He has transitioned repeatedly through acute care, LTACh and SNF settings over the past year. He has a PEG tube but when he is in his best condition ahs toelrated thickened liquids and soft solids as well as thin liquids in between  meals. He is often impulsive and has had aspiration events with solid foods due to lack of dentition and poor safety awareness. Pt has multiple prior MBS with the last pm 04/10/17 which showed ongoing moderate dysphag and risk of aspriation with thin and nectar thick liquids.   Subjective: Pt alert, in bed   Assessment / Plan / Recommendation  CHL IP CLINICAL IMPRESSIONS 05/22/2017  Clinical Impression Pt demonstrates a moderate oropharyngeal dysphagia with delayed airway protection and sensed aspiration before the swallow with thin boluses. Moderate weakness of the  hyolaryngeal mechanism, particularly epiglottic deflection and hyoid excursion noted, resulting in moderate residue with nectar, honey and solids.  Thickened liquids silently penetrate the vestibule post swallow if residue does not clear.  A chin tuck is beneficial both to decrease penetration/aspiration before the swallow and to reduce vallecular residuals. Pt needed cues/assist to fully bring chin to chest. In this position pt successful with purees, solids, nectar with a straw. Penetration during the swallow still occurs with thin with a straw, but minimal. Suggest initiating POs with SLP only, to assist in training with a chin tuck.   SLP Visit Diagnosis Dysphagia, oropharyngeal phase (R13.12)  Attention and concentration deficit following --  Frontal lobe and executive function deficit following --  Impact on safety and function Moderate aspiration risk      CHL IP TREATMENT RECOMMENDATION 05/22/2017  Treatment Recommendations Defer treatment plan to f/u with SLP     Prognosis 04/08/2017  Prognosis for Safe Diet Advancement Guarded  Barriers to Reach Goals Cognitive deficits;Time post onset;Severity of deficits  Barriers/Prognosis Comment --    CHL IP DIET RECOMMENDATION 05/22/2017  SLP Diet Recommendations Dysphagia 3 (Mech soft) solids;Nectar thick liquid;Thin liquid  Liquid Administration via Cup;Straw  Medication Administration Crushed with puree  Compensations Slow rate;Clear throat intermittently;Effortful swallow;Chin tuck;Use straw to facilitate chin tuck;Multiple dry swallows after each bite/sip  Postural Changes Remain semi-upright after after feeds/meals (Comment);Seated upright at 90 degrees      CHL IP OTHER RECOMMENDATIONS 05/22/2017  Recommended Consults --  Oral Care Recommendations Oral care BID  Other Recommendations --      CHL IP FOLLOW UP RECOMMENDATIONS 05/22/2017  Follow up Recommendations Skilled Nursing facility      [facility] IP FREQUENCY AND DURATION 04/10/2017   Speech Therapy Frequency (ACUTE ONLY) min 2x/week  Treatment Duration --           CHL IP ORAL PHASE 05/22/2017  Oral Phase WFL  Oral - Pudding Teaspoon --  Oral - Pudding Cup --  Oral - Honey Teaspoon --  Oral - Honey Cup --  Oral - Nectar Teaspoon --  Oral - Nectar Cup --  Oral - Nectar Straw --  Oral - Thin Teaspoon --  Oral - Thin Cup --  Oral - Thin Straw --  Oral - Puree --  Oral - Mech Soft --  Oral - Regular --  Oral - Multi-Consistency --  Oral - Pill --  Oral Phase - Comment --    CHL IP PHARYNGEAL PHASE 05/22/2017  Pharyngeal Phase Impaired  Pharyngeal- Pudding Teaspoon --  Pharyngeal --  Pharyngeal- Pudding Cup --  Pharyngeal --  Pharyngeal- Honey Teaspoon Pharyngeal residue - valleculae;Reduced tongue base retraction;Reduced laryngeal elevation  Pharyngeal --  Pharyngeal- Honey Cup --  Pharyngeal --  Pharyngeal- Nectar Teaspoon Reduced tongue base retraction;Penetration/Apiration after swallow;Pharyngeal residue - valleculae;Pharyngeal residue - pyriform;Reduced laryngeal elevation;Reduced anterior laryngeal mobility;Compensatory strategies attempted (with notebox);Delayed swallow initiation-vallecula  Pharyngeal Material enters airway, passes BELOW  cords and not ejected out despite cough attempt by patient;Material does not enter airway  Pharyngeal- Nectar Cup --  Pharyngeal --  Pharyngeal- Nectar Straw Reduced tongue base retraction;Penetration/Apiration after swallow;Pharyngeal residue - valleculae;Pharyngeal residue - pyriform;Reduced laryngeal elevation;Reduced anterior laryngeal mobility;Compensatory strategies attempted (with notebox);Delayed swallow initiation-pyriform sinuses  Pharyngeal Material does not enter airway  Pharyngeal- Thin Teaspoon --  Pharyngeal --  Pharyngeal- Thin Cup Penetration/Aspiration before swallow;Reduced epiglottic inversion;Delayed swallow initiation-pyriform sinuses;Reduced laryngeal elevation;Reduced anterior laryngeal  mobility;Reduced tongue base retraction;Significant aspiration (Amount);Pharyngeal residue - valleculae;Pharyngeal residue - pyriform  Pharyngeal Material enters airway, passes BELOW cords and not ejected out despite cough attempt by patient  Pharyngeal- Thin Straw Penetration/Aspiration during swallow;Delayed swallow initiation-pyriform sinuses;Reduced airway/laryngeal closure;Pharyngeal residue - valleculae;Pharyngeal residue - pyriform  Pharyngeal Material enters airway, remains ABOVE vocal cords then ejected out  Pharyngeal- Puree Reduced laryngeal elevation  Pharyngeal --  Pharyngeal- Mechanical Soft Pharyngeal residue - valleculae;Reduced tongue base retraction;Reduced laryngeal elevation  Pharyngeal --  Pharyngeal- Regular --  Pharyngeal --  Pharyngeal- Multi-consistency --  Pharyngeal --  Pharyngeal- Pill --  Pharyngeal --  Pharyngeal Comment --     No flowsheet data found.  CHL IP GO 05/22/2017  Functional Assessment Tool Used clinical judgement  Functional Limitations Swallowing  Swallow Current Status [phone]) CK  Swallow Goal Status (U0454) CK  Swallow Discharge Status (U9811) CK  Motor Speech Current Status (B1478) (None)  Motor Speech Goal Status (G9562) (None)  Motor Speech Goal Status (Z3086) (None)  Spoken Language Comprehension Current Status (V7846) (None)  Spoken Language Comprehension Goal Status (N6295) (None)  Spoken Language Comprehension Discharge Status [phone]) (None)  Spoken Language Expression Current Status [phone]) (None)  Spoken Language Expression Goal Status (U2725) (None)  Spoken Language Expression Discharge Status [phone]) (None)  Attention Current Status (I3474) (None)  Attention Goal Status (Q5956) (None)  Attention Discharge Status [phone]) (None)  Memory Current Status (E3329) (None)  Memory Goal Status (J1884) (None)  Memory Discharge Status (Z6606) (None)  Voice Current Status (T0160) (None)  Voice Goal Status (F0932) (None)  Voice  Discharge Status (T5573) (None)  Other Speech-Language Pathology Functional Limitation Current Status (U2025) (None)  Other Speech-Language Pathology Functional Limitation Goal Status (K2706) (None)  Other Speech-Language Pathology Functional Limitation Discharge Status [phone]) (None)   Harlon Ditty, MA CCC-SLP [phone]  Mellissa Conley, Riley Nearing 05/22/2017, 1:38 PM

## 2017-05-23 ENCOUNTER — Ambulatory Visit (HOSPITAL_COMMUNITY): Payer: Medicare Other

## 2017-05-23 DIAGNOSIS — F39 Unspecified mood [affective] disorder: Secondary | ICD-10-CM | POA: Diagnosis not present

## 2017-05-23 DIAGNOSIS — F329 Major depressive disorder, single episode, unspecified: Secondary | ICD-10-CM | POA: Diagnosis not present

## 2017-05-23 DIAGNOSIS — F419 Anxiety disorder, unspecified: Secondary | ICD-10-CM | POA: Diagnosis not present

## 2017-05-23 DIAGNOSIS — F0390 Unspecified dementia without behavioral disturbance: Secondary | ICD-10-CM | POA: Diagnosis not present

## 2017-06-06 DIAGNOSIS — G8114 Spastic hemiplegia affecting left nondominant side: Secondary | ICD-10-CM | POA: Diagnosis not present

## 2017-06-12 DIAGNOSIS — J9600 Acute respiratory failure, unspecified whether with hypoxia or hypercapnia: Secondary | ICD-10-CM | POA: Diagnosis not present

## 2017-06-12 DIAGNOSIS — E119 Type 2 diabetes mellitus without complications: Secondary | ICD-10-CM | POA: Diagnosis not present

## 2017-06-12 DIAGNOSIS — F329 Major depressive disorder, single episode, unspecified: Secondary | ICD-10-CM | POA: Diagnosis not present

## 2017-06-12 DIAGNOSIS — J449 Chronic obstructive pulmonary disease, unspecified: Secondary | ICD-10-CM | POA: Diagnosis not present

## 2017-06-12 DIAGNOSIS — G8114 Spastic hemiplegia affecting left nondominant side: Secondary | ICD-10-CM | POA: Diagnosis not present

## 2017-06-17 DIAGNOSIS — I509 Heart failure, unspecified: Secondary | ICD-10-CM | POA: Diagnosis not present

## 2017-06-17 DIAGNOSIS — E46 Unspecified protein-calorie malnutrition: Secondary | ICD-10-CM | POA: Diagnosis not present

## 2017-06-17 DIAGNOSIS — K219 Gastro-esophageal reflux disease without esophagitis: Secondary | ICD-10-CM | POA: Diagnosis not present

## 2017-06-17 DIAGNOSIS — J449 Chronic obstructive pulmonary disease, unspecified: Secondary | ICD-10-CM | POA: Diagnosis not present

## 2017-06-19 DIAGNOSIS — G8114 Spastic hemiplegia affecting left nondominant side: Secondary | ICD-10-CM | POA: Diagnosis not present

## 2017-07-03 DIAGNOSIS — G8114 Spastic hemiplegia affecting left nondominant side: Secondary | ICD-10-CM | POA: Diagnosis not present

## 2017-07-05 DIAGNOSIS — R7989 Other specified abnormal findings of blood chemistry: Secondary | ICD-10-CM | POA: Diagnosis not present

## 2017-07-05 DIAGNOSIS — I509 Heart failure, unspecified: Secondary | ICD-10-CM | POA: Diagnosis not present

## 2017-07-07 DIAGNOSIS — G8114 Spastic hemiplegia affecting left nondominant side: Secondary | ICD-10-CM | POA: Diagnosis not present

## 2017-07-10 DIAGNOSIS — I236 Thrombosis of atrium, auricular appendage, and ventricle as current complications following acute myocardial infarction: Secondary | ICD-10-CM | POA: Diagnosis not present

## 2017-07-10 DIAGNOSIS — R7989 Other specified abnormal findings of blood chemistry: Secondary | ICD-10-CM | POA: Diagnosis not present

## 2017-07-10 DIAGNOSIS — J449 Chronic obstructive pulmonary disease, unspecified: Secondary | ICD-10-CM | POA: Diagnosis not present

## 2017-07-10 DIAGNOSIS — I509 Heart failure, unspecified: Secondary | ICD-10-CM | POA: Diagnosis not present

## 2017-07-11 DIAGNOSIS — G8114 Spastic hemiplegia affecting left nondominant side: Secondary | ICD-10-CM | POA: Diagnosis not present

## 2017-07-13 DIAGNOSIS — N189 Chronic kidney disease, unspecified: Secondary | ICD-10-CM | POA: Diagnosis not present

## 2017-07-13 DIAGNOSIS — N179 Acute kidney failure, unspecified: Secondary | ICD-10-CM | POA: Diagnosis not present

## 2017-07-13 DIAGNOSIS — J449 Chronic obstructive pulmonary disease, unspecified: Secondary | ICD-10-CM | POA: Diagnosis not present

## 2017-07-13 DIAGNOSIS — I509 Heart failure, unspecified: Secondary | ICD-10-CM | POA: Diagnosis not present

## 2017-07-17 DIAGNOSIS — G8114 Spastic hemiplegia affecting left nondominant side: Secondary | ICD-10-CM | POA: Diagnosis not present

## 2017-07-19 DIAGNOSIS — G8114 Spastic hemiplegia affecting left nondominant side: Secondary | ICD-10-CM | POA: Diagnosis not present

## 2017-07-21 DIAGNOSIS — G8114 Spastic hemiplegia affecting left nondominant side: Secondary | ICD-10-CM | POA: Diagnosis not present

## 2017-07-24 DIAGNOSIS — L299 Pruritus, unspecified: Secondary | ICD-10-CM | POA: Diagnosis not present

## 2017-07-24 DIAGNOSIS — I69154 Hemiplegia and hemiparesis following nontraumatic intracerebral hemorrhage affecting left non-dominant side: Secondary | ICD-10-CM | POA: Diagnosis not present

## 2017-07-24 DIAGNOSIS — M24562 Contracture, left knee: Secondary | ICD-10-CM | POA: Diagnosis not present

## 2017-07-24 DIAGNOSIS — M6281 Muscle weakness (generalized): Secondary | ICD-10-CM | POA: Diagnosis not present

## 2017-07-24 DIAGNOSIS — R2681 Unsteadiness on feet: Secondary | ICD-10-CM | POA: Diagnosis not present

## 2017-07-24 DIAGNOSIS — J449 Chronic obstructive pulmonary disease, unspecified: Secondary | ICD-10-CM | POA: Diagnosis not present

## 2017-07-24 DIAGNOSIS — F0280 Dementia in other diseases classified elsewhere without behavioral disturbance: Secondary | ICD-10-CM | POA: Diagnosis not present

## 2017-07-24 DIAGNOSIS — K219 Gastro-esophageal reflux disease without esophagitis: Secondary | ICD-10-CM | POA: Diagnosis not present

## 2017-07-24 DIAGNOSIS — J9620 Acute and chronic respiratory failure, unspecified whether with hypoxia or hypercapnia: Secondary | ICD-10-CM | POA: Diagnosis not present

## 2017-07-24 DIAGNOSIS — R1312 Dysphagia, oropharyngeal phase: Secondary | ICD-10-CM | POA: Diagnosis not present

## 2017-07-24 DIAGNOSIS — R21 Rash and other nonspecific skin eruption: Secondary | ICD-10-CM | POA: Diagnosis not present

## 2017-07-24 DIAGNOSIS — I639 Cerebral infarction, unspecified: Secondary | ICD-10-CM | POA: Diagnosis not present

## 2017-07-25 DIAGNOSIS — J9620 Acute and chronic respiratory failure, unspecified whether with hypoxia or hypercapnia: Secondary | ICD-10-CM | POA: Diagnosis not present

## 2017-07-25 DIAGNOSIS — M6281 Muscle weakness (generalized): Secondary | ICD-10-CM | POA: Diagnosis not present

## 2017-07-25 DIAGNOSIS — G8114 Spastic hemiplegia affecting left nondominant side: Secondary | ICD-10-CM | POA: Diagnosis not present

## 2017-07-25 DIAGNOSIS — R2681 Unsteadiness on feet: Secondary | ICD-10-CM | POA: Diagnosis not present

## 2017-07-25 DIAGNOSIS — R1312 Dysphagia, oropharyngeal phase: Secondary | ICD-10-CM | POA: Diagnosis not present

## 2017-07-25 DIAGNOSIS — I69154 Hemiplegia and hemiparesis following nontraumatic intracerebral hemorrhage affecting left non-dominant side: Secondary | ICD-10-CM | POA: Diagnosis not present

## 2017-07-25 DIAGNOSIS — F0280 Dementia in other diseases classified elsewhere without behavioral disturbance: Secondary | ICD-10-CM | POA: Diagnosis not present

## 2017-07-26 DIAGNOSIS — F0280 Dementia in other diseases classified elsewhere without behavioral disturbance: Secondary | ICD-10-CM | POA: Diagnosis not present

## 2017-07-26 DIAGNOSIS — M6281 Muscle weakness (generalized): Secondary | ICD-10-CM | POA: Diagnosis not present

## 2017-07-26 DIAGNOSIS — R2681 Unsteadiness on feet: Secondary | ICD-10-CM | POA: Diagnosis not present

## 2017-07-26 DIAGNOSIS — J9620 Acute and chronic respiratory failure, unspecified whether with hypoxia or hypercapnia: Secondary | ICD-10-CM | POA: Diagnosis not present

## 2017-07-26 DIAGNOSIS — R1312 Dysphagia, oropharyngeal phase: Secondary | ICD-10-CM | POA: Diagnosis not present

## 2017-07-26 DIAGNOSIS — I4891 Unspecified atrial fibrillation: Secondary | ICD-10-CM | POA: Diagnosis not present

## 2017-07-26 DIAGNOSIS — I236 Thrombosis of atrium, auricular appendage, and ventricle as current complications following acute myocardial infarction: Secondary | ICD-10-CM | POA: Diagnosis not present

## 2017-07-26 DIAGNOSIS — I69154 Hemiplegia and hemiparesis following nontraumatic intracerebral hemorrhage affecting left non-dominant side: Secondary | ICD-10-CM | POA: Diagnosis not present

## 2017-07-27 DIAGNOSIS — I69154 Hemiplegia and hemiparesis following nontraumatic intracerebral hemorrhage affecting left non-dominant side: Secondary | ICD-10-CM | POA: Diagnosis not present

## 2017-07-27 DIAGNOSIS — R1312 Dysphagia, oropharyngeal phase: Secondary | ICD-10-CM | POA: Diagnosis not present

## 2017-07-27 DIAGNOSIS — R2681 Unsteadiness on feet: Secondary | ICD-10-CM | POA: Diagnosis not present

## 2017-07-27 DIAGNOSIS — M6281 Muscle weakness (generalized): Secondary | ICD-10-CM | POA: Diagnosis not present

## 2017-07-27 DIAGNOSIS — J9620 Acute and chronic respiratory failure, unspecified whether with hypoxia or hypercapnia: Secondary | ICD-10-CM | POA: Diagnosis not present

## 2017-07-27 DIAGNOSIS — F0280 Dementia in other diseases classified elsewhere without behavioral disturbance: Secondary | ICD-10-CM | POA: Diagnosis not present

## 2017-07-30 DIAGNOSIS — I69154 Hemiplegia and hemiparesis following nontraumatic intracerebral hemorrhage affecting left non-dominant side: Secondary | ICD-10-CM | POA: Diagnosis not present

## 2017-07-30 DIAGNOSIS — F0280 Dementia in other diseases classified elsewhere without behavioral disturbance: Secondary | ICD-10-CM | POA: Diagnosis not present

## 2017-07-30 DIAGNOSIS — M6281 Muscle weakness (generalized): Secondary | ICD-10-CM | POA: Diagnosis not present

## 2017-07-30 DIAGNOSIS — J9620 Acute and chronic respiratory failure, unspecified whether with hypoxia or hypercapnia: Secondary | ICD-10-CM | POA: Diagnosis not present

## 2017-07-30 DIAGNOSIS — R1312 Dysphagia, oropharyngeal phase: Secondary | ICD-10-CM | POA: Diagnosis not present

## 2017-07-30 DIAGNOSIS — R2681 Unsteadiness on feet: Secondary | ICD-10-CM | POA: Diagnosis not present

## 2017-08-01 DIAGNOSIS — J9620 Acute and chronic respiratory failure, unspecified whether with hypoxia or hypercapnia: Secondary | ICD-10-CM | POA: Diagnosis not present

## 2017-08-01 DIAGNOSIS — R1312 Dysphagia, oropharyngeal phase: Secondary | ICD-10-CM | POA: Diagnosis not present

## 2017-08-01 DIAGNOSIS — R2681 Unsteadiness on feet: Secondary | ICD-10-CM | POA: Diagnosis not present

## 2017-08-01 DIAGNOSIS — F0280 Dementia in other diseases classified elsewhere without behavioral disturbance: Secondary | ICD-10-CM | POA: Diagnosis not present

## 2017-08-01 DIAGNOSIS — I69154 Hemiplegia and hemiparesis following nontraumatic intracerebral hemorrhage affecting left non-dominant side: Secondary | ICD-10-CM | POA: Diagnosis not present

## 2017-08-01 DIAGNOSIS — Z794 Long term (current) use of insulin: Secondary | ICD-10-CM | POA: Diagnosis not present

## 2017-08-01 DIAGNOSIS — M6281 Muscle weakness (generalized): Secondary | ICD-10-CM | POA: Diagnosis not present

## 2017-08-01 DIAGNOSIS — B351 Tinea unguium: Secondary | ICD-10-CM | POA: Diagnosis not present

## 2017-08-01 DIAGNOSIS — E1140 Type 2 diabetes mellitus with diabetic neuropathy, unspecified: Secondary | ICD-10-CM | POA: Diagnosis not present

## 2017-08-02 DIAGNOSIS — R2681 Unsteadiness on feet: Secondary | ICD-10-CM | POA: Diagnosis not present

## 2017-08-02 DIAGNOSIS — I236 Thrombosis of atrium, auricular appendage, and ventricle as current complications following acute myocardial infarction: Secondary | ICD-10-CM | POA: Diagnosis not present

## 2017-08-02 DIAGNOSIS — I4891 Unspecified atrial fibrillation: Secondary | ICD-10-CM | POA: Diagnosis not present

## 2017-08-02 DIAGNOSIS — M6281 Muscle weakness (generalized): Secondary | ICD-10-CM | POA: Diagnosis not present

## 2017-08-02 DIAGNOSIS — I69154 Hemiplegia and hemiparesis following nontraumatic intracerebral hemorrhage affecting left non-dominant side: Secondary | ICD-10-CM | POA: Diagnosis not present

## 2017-08-02 DIAGNOSIS — G8114 Spastic hemiplegia affecting left nondominant side: Secondary | ICD-10-CM | POA: Diagnosis not present

## 2017-08-02 DIAGNOSIS — R1312 Dysphagia, oropharyngeal phase: Secondary | ICD-10-CM | POA: Diagnosis not present

## 2017-08-02 DIAGNOSIS — J9620 Acute and chronic respiratory failure, unspecified whether with hypoxia or hypercapnia: Secondary | ICD-10-CM | POA: Diagnosis not present

## 2017-08-02 DIAGNOSIS — F0280 Dementia in other diseases classified elsewhere without behavioral disturbance: Secondary | ICD-10-CM | POA: Diagnosis not present

## 2017-08-04 DIAGNOSIS — G8114 Spastic hemiplegia affecting left nondominant side: Secondary | ICD-10-CM | POA: Diagnosis not present

## 2017-08-07 DIAGNOSIS — I69154 Hemiplegia and hemiparesis following nontraumatic intracerebral hemorrhage affecting left non-dominant side: Secondary | ICD-10-CM | POA: Diagnosis not present

## 2017-08-07 DIAGNOSIS — R2681 Unsteadiness on feet: Secondary | ICD-10-CM | POA: Diagnosis not present

## 2017-08-07 DIAGNOSIS — M6281 Muscle weakness (generalized): Secondary | ICD-10-CM | POA: Diagnosis not present

## 2017-08-07 DIAGNOSIS — N189 Chronic kidney disease, unspecified: Secondary | ICD-10-CM | POA: Diagnosis not present

## 2017-08-07 DIAGNOSIS — F0280 Dementia in other diseases classified elsewhere without behavioral disturbance: Secondary | ICD-10-CM | POA: Diagnosis not present

## 2017-08-07 DIAGNOSIS — I236 Thrombosis of atrium, auricular appendage, and ventricle as current complications following acute myocardial infarction: Secondary | ICD-10-CM | POA: Diagnosis not present

## 2017-08-07 DIAGNOSIS — E782 Mixed hyperlipidemia: Secondary | ICD-10-CM | POA: Diagnosis not present

## 2017-08-07 DIAGNOSIS — J9620 Acute and chronic respiratory failure, unspecified whether with hypoxia or hypercapnia: Secondary | ICD-10-CM | POA: Diagnosis not present

## 2017-08-07 DIAGNOSIS — R1312 Dysphagia, oropharyngeal phase: Secondary | ICD-10-CM | POA: Diagnosis not present

## 2017-08-07 DIAGNOSIS — J449 Chronic obstructive pulmonary disease, unspecified: Secondary | ICD-10-CM | POA: Diagnosis not present

## 2017-08-08 DIAGNOSIS — J9620 Acute and chronic respiratory failure, unspecified whether with hypoxia or hypercapnia: Secondary | ICD-10-CM | POA: Diagnosis not present

## 2017-08-08 DIAGNOSIS — F419 Anxiety disorder, unspecified: Secondary | ICD-10-CM | POA: Diagnosis not present

## 2017-08-08 DIAGNOSIS — R4189 Other symptoms and signs involving cognitive functions and awareness: Secondary | ICD-10-CM | POA: Diagnosis not present

## 2017-08-08 DIAGNOSIS — D649 Anemia, unspecified: Secondary | ICD-10-CM | POA: Diagnosis not present

## 2017-08-08 DIAGNOSIS — E119 Type 2 diabetes mellitus without complications: Secondary | ICD-10-CM | POA: Diagnosis not present

## 2017-08-08 DIAGNOSIS — F0390 Unspecified dementia without behavioral disturbance: Secondary | ICD-10-CM | POA: Diagnosis not present

## 2017-08-08 DIAGNOSIS — F329 Major depressive disorder, single episode, unspecified: Secondary | ICD-10-CM | POA: Diagnosis not present

## 2017-08-09 DIAGNOSIS — I4891 Unspecified atrial fibrillation: Secondary | ICD-10-CM | POA: Diagnosis not present

## 2017-08-09 DIAGNOSIS — I236 Thrombosis of atrium, auricular appendage, and ventricle as current complications following acute myocardial infarction: Secondary | ICD-10-CM | POA: Diagnosis not present

## 2017-08-09 DIAGNOSIS — G8114 Spastic hemiplegia affecting left nondominant side: Secondary | ICD-10-CM | POA: Diagnosis not present

## 2017-08-10 DIAGNOSIS — F329 Major depressive disorder, single episode, unspecified: Secondary | ICD-10-CM | POA: Diagnosis not present

## 2017-08-10 DIAGNOSIS — R1310 Dysphagia, unspecified: Secondary | ICD-10-CM | POA: Diagnosis not present

## 2017-08-10 DIAGNOSIS — E782 Mixed hyperlipidemia: Secondary | ICD-10-CM | POA: Diagnosis not present

## 2017-08-10 DIAGNOSIS — N189 Chronic kidney disease, unspecified: Secondary | ICD-10-CM | POA: Diagnosis not present

## 2017-08-15 DIAGNOSIS — G8114 Spastic hemiplegia affecting left nondominant side: Secondary | ICD-10-CM | POA: Diagnosis not present

## 2017-08-15 DIAGNOSIS — F329 Major depressive disorder, single episode, unspecified: Secondary | ICD-10-CM | POA: Diagnosis not present

## 2017-08-15 DIAGNOSIS — F419 Anxiety disorder, unspecified: Secondary | ICD-10-CM | POA: Diagnosis not present

## 2017-08-15 DIAGNOSIS — F0390 Unspecified dementia without behavioral disturbance: Secondary | ICD-10-CM | POA: Diagnosis not present

## 2017-08-15 DIAGNOSIS — F39 Unspecified mood [affective] disorder: Secondary | ICD-10-CM | POA: Diagnosis not present

## 2017-08-18 DIAGNOSIS — N390 Urinary tract infection, site not specified: Secondary | ICD-10-CM | POA: Diagnosis not present

## 2017-08-18 DIAGNOSIS — Z79899 Other long term (current) drug therapy: Secondary | ICD-10-CM | POA: Diagnosis not present

## 2017-08-18 DIAGNOSIS — R319 Hematuria, unspecified: Secondary | ICD-10-CM | POA: Diagnosis not present

## 2017-08-20 ENCOUNTER — Emergency Department (HOSPITAL_COMMUNITY)
Admission: EM | Admit: 2017-08-20 | Discharge: 2017-08-20 | Disposition: A | Discharge status: Home / Self Care | Payer: Medicare Other | Attending: Emergency Medicine | Admitting: Emergency Medicine

## 2017-08-20 ENCOUNTER — Emergency Department (HOSPITAL_COMMUNITY): Payer: Medicare Other

## 2017-08-20 ENCOUNTER — Encounter (HOSPITAL_COMMUNITY): Payer: Self-pay | Admitting: *Deleted

## 2017-08-20 DIAGNOSIS — J449 Chronic obstructive pulmonary disease, unspecified: Secondary | ICD-10-CM | POA: Insufficient documentation

## 2017-08-20 DIAGNOSIS — Z8673 Personal history of transient ischemic attack (TIA), and cerebral infarction without residual deficits: Secondary | ICD-10-CM | POA: Insufficient documentation

## 2017-08-20 DIAGNOSIS — R791 Abnormal coagulation profile: Secondary | ICD-10-CM | POA: Diagnosis not present

## 2017-08-20 DIAGNOSIS — Z7901 Long term (current) use of anticoagulants: Secondary | ICD-10-CM | POA: Insufficient documentation

## 2017-08-20 DIAGNOSIS — M79672 Pain in left foot: Secondary | ICD-10-CM | POA: Diagnosis not present

## 2017-08-20 DIAGNOSIS — I110 Hypertensive heart disease with heart failure: Secondary | ICD-10-CM | POA: Insufficient documentation

## 2017-08-20 DIAGNOSIS — E119 Type 2 diabetes mellitus without complications: Secondary | ICD-10-CM | POA: Insufficient documentation

## 2017-08-20 DIAGNOSIS — Z66 Do not resuscitate: Secondary | ICD-10-CM | POA: Insufficient documentation

## 2017-08-20 DIAGNOSIS — I10 Essential (primary) hypertension: Secondary | ICD-10-CM | POA: Diagnosis not present

## 2017-08-20 DIAGNOSIS — R031 Nonspecific low blood-pressure reading: Secondary | ICD-10-CM | POA: Diagnosis not present

## 2017-08-20 DIAGNOSIS — F0390 Unspecified dementia without behavioral disturbance: Secondary | ICD-10-CM | POA: Insufficient documentation

## 2017-08-20 DIAGNOSIS — Z87891 Personal history of nicotine dependence: Secondary | ICD-10-CM | POA: Insufficient documentation

## 2017-08-20 DIAGNOSIS — I252 Old myocardial infarction: Secondary | ICD-10-CM | POA: Diagnosis not present

## 2017-08-20 DIAGNOSIS — I2510 Atherosclerotic heart disease of native coronary artery without angina pectoris: Secondary | ICD-10-CM | POA: Insufficient documentation

## 2017-08-20 DIAGNOSIS — R040 Epistaxis: Secondary | ICD-10-CM | POA: Insufficient documentation

## 2017-08-20 DIAGNOSIS — I5022 Chronic systolic (congestive) heart failure: Secondary | ICD-10-CM | POA: Diagnosis not present

## 2017-08-20 DIAGNOSIS — Z79899 Other long term (current) drug therapy: Secondary | ICD-10-CM | POA: Diagnosis not present

## 2017-08-20 LAB — COMPREHENSIVE METABOLIC PANEL
ALBUMIN: 2.6 g/dL — AB (ref 3.5–5.0)
ALK PHOS: 108 U/L (ref 38–126)
ALT: 19 U/L (ref 17–63)
ANION GAP: 10 (ref 5–15)
AST: 29 U/L (ref 15–41)
BILIRUBIN TOTAL: 0.6 mg/dL (ref 0.3–1.2)
BUN: 41 mg/dL — AB (ref 6–20)
CALCIUM: 8.9 mg/dL (ref 8.9–10.3)
CO2: 20 mmol/L — ABNORMAL LOW (ref 22–32)
CREATININE: 2.56 mg/dL — AB (ref 0.61–1.24)
Chloride: 110 mmol/L (ref 101–111)
GFR calc Af Amer: 29 mL/min — ABNORMAL LOW (ref >60)
GFR calc non Af Amer: 25 mL/min — ABNORMAL LOW (ref >60)
GLUCOSE: 90 mg/dL (ref 65–99)
Potassium: 4.2 mmol/L (ref 3.5–5.1)
Sodium: 140 mmol/L (ref 135–145)
TOTAL PROTEIN: 7.5 g/dL (ref 6.5–8.1)

## 2017-08-20 LAB — CBC WITH DIFFERENTIAL/PLATELET
BASOS ABS: 0.1 10*3/uL (ref 0.0–0.1)
BASOS PCT: 1 %
Eosinophils Absolute: 0.7 10*3/uL (ref 0.0–0.7)
Eosinophils Relative: 8 %
HEMATOCRIT: 36.9 % — AB (ref 39.0–52.0)
HEMOGLOBIN: 12.2 g/dL — AB (ref 13.0–17.0)
LYMPHS PCT: 29 %
Lymphs Abs: 2.5 10*3/uL (ref 0.7–4.0)
MCH: 32.2 pg (ref 26.0–34.0)
MCHC: 33.1 g/dL (ref 30.0–36.0)
MCV: 97.4 fL (ref 78.0–100.0)
MONOS PCT: 14 %
Monocytes Absolute: 1.2 10*3/uL — ABNORMAL HIGH (ref 0.1–1.0)
Neutro Abs: 4.1 10*3/uL (ref 1.7–7.7)
Neutrophils Relative %: 48 %
Platelets: ADEQUATE 10*3/uL (ref 150–400)
RBC: 3.79 MIL/uL — ABNORMAL LOW (ref 4.22–5.81)
RDW: 13.7 % (ref 11.5–15.5)
Smear Review: ADEQUATE
WBC: 8.6 10*3/uL (ref 4.0–10.5)

## 2017-08-20 LAB — PROTIME-INR

## 2017-08-20 MED ORDER — TRANEXAMIC ACID 1000 MG/10ML IV SOLN
500.0000 mg | Freq: Once | INTRAVENOUS | Status: AC
  Administered 2017-08-20: 500 mg via TOPICAL
  Filled 2017-08-20 (×2): qty 10

## 2017-08-20 MED ORDER — PHYTONADIONE 5 MG PO TABS
5.0000 mg | ORAL_TABLET | Freq: Once | ORAL | Status: AC
  Administered 2017-08-20: 5 mg via ORAL
  Filled 2017-08-20: qty 1

## 2017-08-20 NOTE — ED Notes (Signed)
ED Provider at bedside. 

## 2017-08-20 NOTE — Discharge Instructions (Signed)
Your INR was extremely elevated today. We gave you 5mg  of vitamin K to help correct this, however, you MUST get your INR checked again in the morning and follow up with your primary doctor for discussion of INR management.   Your nosebleed and now clotted. If bleeding resumes and is not resolved with 20 minutes of direct pressure, please return to ER as you will likely need further intervention to stop bleeding.   You may also return to ER for new or worsening symptoms, any additional concerns.

## 2017-08-20 NOTE — ED Provider Notes (Signed)
[facility] Provider Note   CSN: [id number] Arrival date & time: 08/20/17  1125     History   Chief Complaint Chief Complaint  Patient presents with  . Epistaxis    HPI Curtis Keller is a 61 y.o. male.  The history is provided by the patient and medical records. No language interpreter was used.  Epistaxis     Curtis Keller is a 61 y.o. male  with a PMH of prior cerebral infarct and apical mural thrombus on coumadin, DM who [facility] for epistaxis which began yesterday afternoon around 5pm. Nosebleed was controlled, but then returned this morning. Upon ED arrival, patient states that his nose was bleeding on both sides, but bleeding has now stopped. His main complaint is left foot pain. He denies known injury / fall / inciting event. Pain is worse with palpation. Denies history of similar. No medications taken prior to arrival for either complaint.  Past Medical History:  Diagnosis Date  . Acute respiratory failure (HCC)    a. Spring 2017 following fall/splenectomy -->admitted to [facility]-->trach/g tube.  . Anemia    a. 12/2015 ABL in setting of fall/hematomas/splenic laceration req splenectomy.  Marland Kitchen. Anxiety   . Apical mural thrombus    a. 11/2015 Echo: EF 25-30%, moderate size apical thrombus-->coumadin;  b. 12/2015 f/u Echo: EF 25-30%, ant/septa HK, no obvious large thrombus but cannot exclude small mural thrombus.  Marland Kitchen. CAD (coronary artery disease)    a. 12/2014 s/p BMS to the RCA.  Marland Kitchen. Cerebral infarction (HCC)   . Chronic bronchitis (HCC)    "probably"/sister 04/07/2017  . Chronic systolic CHF (congestive heart failure) (HCC)    a. 12/2015 Echo: EF 25-30%.  . Dementia   . Depression   . Dysphagia   . Falls    a. 11/2015 traumatic fall with resultant trauma req splenectomy and prolonged hospitalization complicated by resp failure.  . Hepatitis C    "never treated"  (04/07/2017)  . History of blood transfusion 12/2015; 04/07/2017   "related to spleen OR; related to low HgB"   . History of gout   . Hyperlipidemia   . Hypertension   . Ischemic cardiomyopathy    a. 12/2015 Echo: EF 25-30%, anterior and septal HK.  Marland Kitchen. Left hemiplegia (HCC)   . Myocardial infarction (HCC) 1999; ?2010  . On home oxygen therapy    "28% at the SNF" (6/15/2018Z)  . Pneumonia    "several times since 12/19/2015" (6/15/20180)  . Protein calorie malnutrition (HCC)   . Respiratory failure (HCC)   . Spleen injury   . Status post tracheostomy (HCC)    a. 01/2016 in setting of ongoing resp failure and aspiration.  . Stroke (HCC) 12/19/2015   "left side paralyzed since" (04/07/2017)  . Type II diabetes mellitus [facility](HCC)     Patient Active Problem List   Diagnosis Date Noted  . Recurrent aspiration pneumonia (HCC) 05/06/2017  . Tracheostomy complication (HCC) 05/05/2017  . Complication of tracheostomy tube (HCC)   . Sepsis (HCC) 04/07/2017  . Acute on chronic respiratory failure with hypoxia (HCC)   . SOB (shortness of breath)   . Hypoxia   . Pressure injury of skin 03/20/2017  . HCAP (healthcare-associated pneumonia) 03/18/2017  . Pneumonia 03/17/2017  . 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 Depression with anxiety 01/23/2017  . Encounter for hospice care discussion   . DNR (do  not resuscitate)   . Ischemic cardiomyopathy   . Goals of care, counseling/discussion   . Palliative care encounter   . Acute systolic congestive heart failure (HCC)   . Acute and chronic respiratory failure with hypoxia (HCC) 11/28/2016  . Hip fracture (HCC) 11/27/2016  . Closed nondisplaced intertrochanteric fracture of left femur (HCC) 11/27/2016  . Chronic respiratory failure (HCC) 11/27/2016  . Fall at nursing home 07/17/2016  . Diabetes mellitus, type II, insulin dependent (HCC) 06/12/2016  . GERD (gastroesophageal reflux disease) 05/07/2016  . COPD (chronic  obstructive pulmonary disease) (HCC) 04/18/2016  . Chronic anticoagulation 03/22/2016  . S/P splenectomy 03/13/2016  . PEG (percutaneous endoscopic gastrostomy) status (HCC) 03/13/2016  . MRSA (methicillin resistant Staphylococcus aureus) septicemia (HCC) 03/13/2016  . Aspiration pneumonia (HCC) 03/13/2016  . Dysphagia 03/13/2016  . Acute blood loss anemia 03/13/2016  . Hyperlipidemia 03/13/2016  . Heart failure, chronic systolic (HCC)   . Hypertension   . Depression   . Cardiomyopathy (HCC)   . Apical mural thrombus   . Protein calorie malnutrition (HCC)   . Intraparenchymal hematoma of brain due to trauma [facility])     Past Surgical History:  Procedure Laterality Date  . ABDOMINAL HERNIA REPAIR    . CARDIAC CATHETERIZATION    . CORONARY ANGIOPLASTY WITH STENT PLACEMENT  1999   "[place]"  . HERNIA REPAIR    . HIP ARTHROPLASTY Left 02/10/2017   Procedure: LEFT HIP HEMIARTHROPLASTY;  Surgeon: [name], MD;  Location: [facility] OR;  Service: Orthopedics;  Laterality: Left;  . IR GASTROSTOMY TUBE MOD SED  02/02/2017  . IR GENERIC HISTORICAL  07/22/2016   IR GASTROSTOMY TUBE REMOVAL 07/22/2016 [name], MD [facility]-INTERV RAD  . PR LAP, SPLENECTOMY  01/07/2016  . TRACHEOSTOMY TUBE PLACEMENT N/A 02/22/2016   Procedure: TRACHEOSTOMY;  Surgeon: [name], MD;  Location: [facility] OR;  Service: ENT;  Laterality: N/A;  . TRACHEOSTOMY TUBE PLACEMENT N/A 01/14/2017   Procedure: TRACHEOSTOMY;  Surgeon: [name], MD;  Location: [facility] OR;  Service: ENT;  Laterality: N/A;  inserted #[id number] [id number]       Home Medications    Prior to Admission medications   Medication Sig Start Date End Date Taking? Authorizing Provider  acetylcysteine (MUCOMYST) 20 % nebulizer solution Take 4 mLs by nebulization 2 (two) times daily. 04/13/17   Rai, Ripudeep Kirtland Bouchard, MD  albuterol (PROVENTIL) (2.5 MG/3ML) 0.083% nebulizer solution Take 3 mLs (2.5 mg total) by nebulization every 4 (four) hours as needed for  wheezing or shortness of breath. 04/13/17   Rai, Ripudeep K, MD  Amino Acids-Protein Hydrolys (FEEDING SUPPLEMENT, PRO-STAT SUGAR FREE 64,) LIQD Place 60 mLs into feeding tube 2 (two) times daily.     [provider]  atorvastatin (LIPITOR) 40 MG tablet Place 40 mg into feeding tube at bedtime. for Lipids    [provider]  baclofen (LIORESAL) 10 MG tablet Take 5-10 mg by mouth See admin instructions. Take 5 mg to 10 mg three times a day for spasms    [provider]  bisacodyl (DULCOLAX) 10 MG suppository Place 1 suppository (10 mg total) rectally daily as needed for moderate constipation. 04/13/17   Rai, Ripudeep K, MD  busPIRone (BUSPAR) 5 MG tablet Take 5 mg by mouth 2 (two) times daily.    [provider]  chlorhexidine (PERIDEX) 0.12 % solution Use as directed 15 mLs in the mouth or throat 4 (four) times daily. 05/06/17   Calvert Cantor, MD  Chlorhexidine Gluconate Cloth  2 % PADS Apply 6 each topically daily at 6 (six) AM. 05/08/17   Calvert Cantor, MD  enoxaparin (LOVENOX) 100 MG/ML injection Inject 1 mL (100 mg total) into the skin 2 (two) times daily. Continue lovenox bridge until INR therapeutic stable between 2-3, then off 04/13/17   Rai, Ripudeep K, MD  famotidine (PEPCID) 20 MG tablet Place 20 mg into feeding tube 2 (two) times daily. for GERD    [provider]  folic acid (FOLVITE) 1 MG tablet Place 1 mg into feeding tube daily.     [provider]  guaiFENesin 200 MG tablet Place 200 mg into feeding tube 3 (three) times daily. for CHF    [provider]  ipratropium-albuterol (DUONEB) 0.5-2.5 (3) MG/3ML SOLN Take 3 mLs by nebulization 4 (four) times daily. 04/13/17   Rai, Ripudeep K, MD  lidocaine (LIDODERM) 5 % Place 2 patches onto the skin See admin instructions. Apply 2 patches to left knee every morning -remove & discard patch within 12 hours or as directed by MD 04/13/17   Rai, Delene Ruffini, MD  metoprolol tartrate (LOPRESSOR) 25  MG tablet Place 25 mg into feeding tube 2 (two) times daily.    [provider]  Multiple Vitamin (MULTIVITAMIN WITH MINERALS) TABS tablet Place 1 tablet into feeding tube daily.    [provider]  mupirocin ointment (BACTROBAN) 2 % Place 1 application into the nose 2 (two) times daily. 05/07/17   Calvert Cantor, MD  Nutritional Supplements (FEEDING SUPPLEMENT, OSMOLITE 1.5 CAL,) LIQD Place 1,000 mLs into feeding tube See admin instructions. Run at 80 cc/hr 8 hrs via peg tube. Start at 1600 and stop  @@ 10 am two times a day related to tracheostomy status    [provider]  Omega-3 Fatty Acids (FISH OIL) 1000 MG CAPS Place 1,000 mg into feeding tube daily.    [provider]  polyethylene glycol (MIRALAX / GLYCOLAX) packet Place 17 g into feeding tube daily. for constipation    [provider]  senna (SENOKOT) 8.6 MG TABS tablet Place 1 tablet (8.6 mg total) into feeding tube daily as needed for mild constipation. For constipation 04/13/17   Rai, Delene Ruffini, MD  tamsulosin (FLOMAX) 0.4 MG CAPS capsule 0.4 mg daily at 6 PM. Give 1 capsule (0.4 mg) via PEG-Tube one time daily    [provider]  vitamin B-12 1000 MCG tablet Place 1 tablet (1,000 mcg total) into feeding tube daily. 04/14/17   Rai, Delene Ruffini, MD  warfarin (COUMADIN) 4 MG tablet Place 2 tablets (8 mg total) into feeding tube one time only at 6 PM. 05/07/17 05/08/17  Calvert Cantor, MD  Water For Irrigation, Sterile (FREE WATER) SOLN Place 150 mLs into feeding tube every 4 (four) hours. 03/30/17   Richarda Overlie, MD    Family History Family History  Problem Relation Age of Onset  . Alcohol abuse Mother   . Hypertension Mother   . Coronary artery disease Mother   . Alcohol abuse Father   . Hypertension Father   . Coronary artery disease Father   . Alcohol abuse Maternal Uncle     Social History Social History  Substance Use Topics  . Smoking status: Former Smoker    Packs/day: 2.00     Years: 38.00    Types: Cigarettes    Quit date: 11/25/2015  . Smokeless tobacco: Never Used     Comment: quit spring of 2017.  Marland Kitchen Alcohol use No  Allergies   Codeine; Lisinopril; Hydrocodone; Hydrocodone-acetaminophen; Tegaderm ag mesh [silver]; and Tylenol [acetaminophen]   Review of Systems Review of Systems  Constitutional: Negative for fever.  HENT: Positive for nosebleeds. Negative for congestion.   Musculoskeletal: Positive for arthralgias.  Neurological: Negative for headaches.  All other systems reviewed and are negative.    Physical Exam Updated Vital Signs BP 99/71   Pulse 63   Temp 98 F (36.7 C) (Oral)   Resp (!) 22   SpO2 94%   Physical Exam  Constitutional: He is oriented to person, place, and time. He appears well-developed and well-nourished. No distress.  HENT:  Head: Normocephalic.  Clotted blood noted in right nare with no active bleeding.  Cardiovascular: Normal rate, regular rhythm and normal heart sounds.   No murmur heard. Pulmonary/Chest: Effort normal and breath sounds normal. No respiratory distress.  Abdominal: Soft. He exhibits no distension. There is no tenderness.  Musculoskeletal:  Tenderness to palpation along the dorsum of the left foot with associated ecchymosis. Full ROM. 2+ DP and sensation intact.  Neurological: He is alert and oriented to person, place, and time.  Skin: Skin is warm and dry.  Nursing note and vitals reviewed.    ED Treatments / Results  Labs (all labs ordered are listed, but only abnormal results are displayed) Labs Reviewed  PROTIME-INR - Abnormal; Notable for the following:       Result Value   Prothrombin Time >90.0 (*)    INR >10.00 (*)    All other components within normal limits  CBC WITH DIFFERENTIAL/PLATELET - Abnormal; Notable for the following:    RBC 3.79 (*)    Hemoglobin 12.2 (*)    HCT 36.9 (*)    Monocytes Absolute 1.2 (*)    All other components within normal limits   COMPREHENSIVE METABOLIC PANEL    EKG  EKG Interpretation None       Radiology Dg Foot Complete Left  Result Date: 08/20/2017 CLINICAL DATA:  Left foot pain.  No known injury. EXAM: LEFT FOOT - COMPLETE 3+ VIEW COMPARISON:  None. FINDINGS: There is no evidence of acute fracture or dislocation. Old fracture deformity of fifth metatarsal noted. There is no evidence of arthropathy. Diffuse osteopenia. Prominent plantar calcaneal bone spur. IMPRESSION: No acute findings. Electronically Signed   By: Myles Rosenthal M.D.   On: 08/20/2017 14:06    Procedures Procedures (including critical care time)  Medications Ordered in ED Medications  tranexamic acid (CYKLOKAPRON) injection 500 mg (500 mg Topical Given 08/20/17 1249)  phytonadione (VITAMIN K) tablet 5 mg (5 mg Oral Given 08/20/17 1444)     Initial Impression / Assessment and Plan / ED Course  I have reviewed the triage vital signs and the nursing notes.  Pertinent labs & imaging results that were available during my care of the patient were reviewed by me and considered in my medical decision making (see chart for details).    Curtis Keller is a 60 y.o. male who presents to ED for two complaints:  1. Epistaxis on coumadin: TXA placed in ED. Patient re-evaluated with bleeding controlled. INR > 10. Vitamin K given. Patient to have INR checked tomorrow morning and have close PCP follow up for INR management.   2. Left foot pain: X-ray negative. No signs of infection. NVI. Symptomatic home care.   Return precautions discussed with patient and included in discharge information for facility. Should return immediately if bleeding returns.   Patient seen by and discussed with Dr.  Preston Fleeting who agrees with treatment plan.    Addendum: while awaiting transport back to facility, nose began bleeding again. Attending, Dr. Preston Fleeting, performed epistaxis management and nasal tampon placed. Bleeding controlled. ENT and PCP follow up.   Final  Clinical Impressions(s) / ED Diagnoses   Final diagnoses:  Epistaxis  Abnormal INR  Left foot pain    New Prescriptions New Prescriptions   No medications on file     Yeraldin Litzenberger, Chase Picket, PA-C 08/20/17 1610    Rashawn Rolon, Chase Picket, PA-C 08/20/17 1909    Dione Booze, MD 08/23/17 1429

## 2017-08-20 NOTE — ED Notes (Signed)
Attempted try patient in hall. Other nurse found patient attempted to get out of bed, confused. Patient back in room but still making attempts to get out of bed.

## 2017-08-20 NOTE — ED Triage Notes (Signed)
Per EMS, pt from [facility] and rehab, staff reports epistaxis, onset was yesterday afternoon at 1700.  Pt is alert and oriented per norm.

## 2017-08-20 NOTE — ED Notes (Signed)
Patient confused. Unable to sign

## 2017-08-20 NOTE — ED Notes (Signed)
Patient transported to X-ray 

## 2017-08-21 DIAGNOSIS — I69154 Hemiplegia and hemiparesis following nontraumatic intracerebral hemorrhage affecting left non-dominant side: Secondary | ICD-10-CM | POA: Diagnosis not present

## 2017-08-21 DIAGNOSIS — J9620 Acute and chronic respiratory failure, unspecified whether with hypoxia or hypercapnia: Secondary | ICD-10-CM | POA: Diagnosis not present

## 2017-08-21 DIAGNOSIS — M6281 Muscle weakness (generalized): Secondary | ICD-10-CM | POA: Diagnosis not present

## 2017-08-21 DIAGNOSIS — R040 Epistaxis: Secondary | ICD-10-CM | POA: Diagnosis not present

## 2017-08-21 DIAGNOSIS — R2681 Unsteadiness on feet: Secondary | ICD-10-CM | POA: Diagnosis not present

## 2017-08-21 DIAGNOSIS — F0280 Dementia in other diseases classified elsewhere without behavioral disturbance: Secondary | ICD-10-CM | POA: Diagnosis not present

## 2017-08-21 DIAGNOSIS — R1312 Dysphagia, oropharyngeal phase: Secondary | ICD-10-CM | POA: Diagnosis not present

## 2017-08-21 DIAGNOSIS — T45515A Adverse effect of anticoagulants, initial encounter: Secondary | ICD-10-CM | POA: Diagnosis not present

## 2017-08-22 DIAGNOSIS — I69154 Hemiplegia and hemiparesis following nontraumatic intracerebral hemorrhage affecting left non-dominant side: Secondary | ICD-10-CM | POA: Diagnosis not present

## 2017-08-22 DIAGNOSIS — F0280 Dementia in other diseases classified elsewhere without behavioral disturbance: Secondary | ICD-10-CM | POA: Diagnosis not present

## 2017-08-22 DIAGNOSIS — M6281 Muscle weakness (generalized): Secondary | ICD-10-CM | POA: Diagnosis not present

## 2017-08-22 DIAGNOSIS — R2681 Unsteadiness on feet: Secondary | ICD-10-CM | POA: Diagnosis not present

## 2017-08-22 DIAGNOSIS — R1312 Dysphagia, oropharyngeal phase: Secondary | ICD-10-CM | POA: Diagnosis not present

## 2017-08-22 DIAGNOSIS — J9620 Acute and chronic respiratory failure, unspecified whether with hypoxia or hypercapnia: Secondary | ICD-10-CM | POA: Diagnosis not present

## 2017-08-23 DIAGNOSIS — Z7901 Long term (current) use of anticoagulants: Secondary | ICD-10-CM | POA: Diagnosis not present

## 2017-08-23 DIAGNOSIS — I4891 Unspecified atrial fibrillation: Secondary | ICD-10-CM | POA: Diagnosis not present

## 2017-08-24 DIAGNOSIS — G8114 Spastic hemiplegia affecting left nondominant side: Secondary | ICD-10-CM | POA: Diagnosis not present

## 2017-08-24 DIAGNOSIS — Z7901 Long term (current) use of anticoagulants: Secondary | ICD-10-CM | POA: Diagnosis not present

## 2017-08-24 DIAGNOSIS — K219 Gastro-esophageal reflux disease without esophagitis: Secondary | ICD-10-CM | POA: Diagnosis not present

## 2017-08-24 DIAGNOSIS — F0280 Dementia in other diseases classified elsewhere without behavioral disturbance: Secondary | ICD-10-CM | POA: Diagnosis not present

## 2017-08-24 DIAGNOSIS — J9620 Acute and chronic respiratory failure, unspecified whether with hypoxia or hypercapnia: Secondary | ICD-10-CM | POA: Diagnosis not present

## 2017-08-24 DIAGNOSIS — I69154 Hemiplegia and hemiparesis following nontraumatic intracerebral hemorrhage affecting left non-dominant side: Secondary | ICD-10-CM | POA: Diagnosis not present

## 2017-08-24 DIAGNOSIS — I4891 Unspecified atrial fibrillation: Secondary | ICD-10-CM | POA: Diagnosis not present

## 2017-08-24 DIAGNOSIS — M6281 Muscle weakness (generalized): Secondary | ICD-10-CM | POA: Diagnosis not present

## 2017-08-24 DIAGNOSIS — J449 Chronic obstructive pulmonary disease, unspecified: Secondary | ICD-10-CM | POA: Diagnosis not present

## 2017-08-24 DIAGNOSIS — R1312 Dysphagia, oropharyngeal phase: Secondary | ICD-10-CM | POA: Diagnosis not present

## 2017-08-24 DIAGNOSIS — I10 Essential (primary) hypertension: Secondary | ICD-10-CM | POA: Diagnosis not present

## 2017-08-24 DIAGNOSIS — M24562 Contracture, left knee: Secondary | ICD-10-CM | POA: Diagnosis not present

## 2017-08-24 DIAGNOSIS — I639 Cerebral infarction, unspecified: Secondary | ICD-10-CM | POA: Diagnosis not present

## 2017-08-24 DIAGNOSIS — Z79899 Other long term (current) drug therapy: Secondary | ICD-10-CM | POA: Diagnosis not present

## 2017-08-24 DIAGNOSIS — R2681 Unsteadiness on feet: Secondary | ICD-10-CM | POA: Diagnosis not present

## 2017-08-25 DIAGNOSIS — F39 Unspecified mood [affective] disorder: Secondary | ICD-10-CM | POA: Diagnosis not present

## 2017-08-25 DIAGNOSIS — F0390 Unspecified dementia without behavioral disturbance: Secondary | ICD-10-CM | POA: Diagnosis not present

## 2017-08-25 DIAGNOSIS — F329 Major depressive disorder, single episode, unspecified: Secondary | ICD-10-CM | POA: Diagnosis not present

## 2017-08-25 DIAGNOSIS — F419 Anxiety disorder, unspecified: Secondary | ICD-10-CM | POA: Diagnosis not present

## 2017-08-25 DIAGNOSIS — Z7901 Long term (current) use of anticoagulants: Secondary | ICD-10-CM | POA: Diagnosis not present

## 2017-08-25 DIAGNOSIS — I4891 Unspecified atrial fibrillation: Secondary | ICD-10-CM | POA: Diagnosis not present

## 2017-08-28 DIAGNOSIS — I482 Chronic atrial fibrillation: Secondary | ICD-10-CM | POA: Diagnosis not present

## 2017-08-28 DIAGNOSIS — I4891 Unspecified atrial fibrillation: Secondary | ICD-10-CM | POA: Diagnosis not present

## 2017-08-29 DIAGNOSIS — R1312 Dysphagia, oropharyngeal phase: Secondary | ICD-10-CM | POA: Diagnosis not present

## 2017-08-29 DIAGNOSIS — F0280 Dementia in other diseases classified elsewhere without behavioral disturbance: Secondary | ICD-10-CM | POA: Diagnosis not present

## 2017-08-29 DIAGNOSIS — R2681 Unsteadiness on feet: Secondary | ICD-10-CM | POA: Diagnosis not present

## 2017-08-29 DIAGNOSIS — M6281 Muscle weakness (generalized): Secondary | ICD-10-CM | POA: Diagnosis not present

## 2017-08-29 DIAGNOSIS — I69154 Hemiplegia and hemiparesis following nontraumatic intracerebral hemorrhage affecting left non-dominant side: Secondary | ICD-10-CM | POA: Diagnosis not present

## 2017-08-29 DIAGNOSIS — J9620 Acute and chronic respiratory failure, unspecified whether with hypoxia or hypercapnia: Secondary | ICD-10-CM | POA: Diagnosis not present

## 2017-08-30 DIAGNOSIS — M6281 Muscle weakness (generalized): Secondary | ICD-10-CM | POA: Diagnosis not present

## 2017-08-30 DIAGNOSIS — J9620 Acute and chronic respiratory failure, unspecified whether with hypoxia or hypercapnia: Secondary | ICD-10-CM | POA: Diagnosis not present

## 2017-08-30 DIAGNOSIS — I69154 Hemiplegia and hemiparesis following nontraumatic intracerebral hemorrhage affecting left non-dominant side: Secondary | ICD-10-CM | POA: Diagnosis not present

## 2017-08-30 DIAGNOSIS — R1312 Dysphagia, oropharyngeal phase: Secondary | ICD-10-CM | POA: Diagnosis not present

## 2017-08-30 DIAGNOSIS — F0280 Dementia in other diseases classified elsewhere without behavioral disturbance: Secondary | ICD-10-CM | POA: Diagnosis not present

## 2017-08-30 DIAGNOSIS — R2681 Unsteadiness on feet: Secondary | ICD-10-CM | POA: Diagnosis not present

## 2017-08-31 DIAGNOSIS — I4891 Unspecified atrial fibrillation: Secondary | ICD-10-CM | POA: Diagnosis not present

## 2017-08-31 DIAGNOSIS — Z7901 Long term (current) use of anticoagulants: Secondary | ICD-10-CM | POA: Diagnosis not present

## 2017-09-01 DIAGNOSIS — I69154 Hemiplegia and hemiparesis following nontraumatic intracerebral hemorrhage affecting left non-dominant side: Secondary | ICD-10-CM | POA: Diagnosis not present

## 2017-09-01 DIAGNOSIS — J9620 Acute and chronic respiratory failure, unspecified whether with hypoxia or hypercapnia: Secondary | ICD-10-CM | POA: Diagnosis not present

## 2017-09-01 DIAGNOSIS — E46 Unspecified protein-calorie malnutrition: Secondary | ICD-10-CM | POA: Diagnosis not present

## 2017-09-01 DIAGNOSIS — R1312 Dysphagia, oropharyngeal phase: Secondary | ICD-10-CM | POA: Diagnosis not present

## 2017-09-01 DIAGNOSIS — R2681 Unsteadiness on feet: Secondary | ICD-10-CM | POA: Diagnosis not present

## 2017-09-01 DIAGNOSIS — M6281 Muscle weakness (generalized): Secondary | ICD-10-CM | POA: Diagnosis not present

## 2017-09-01 DIAGNOSIS — F0280 Dementia in other diseases classified elsewhere without behavioral disturbance: Secondary | ICD-10-CM | POA: Diagnosis not present

## 2017-09-01 DIAGNOSIS — I509 Heart failure, unspecified: Secondary | ICD-10-CM | POA: Diagnosis not present

## 2017-09-01 DIAGNOSIS — J449 Chronic obstructive pulmonary disease, unspecified: Secondary | ICD-10-CM | POA: Diagnosis not present

## 2017-09-01 DIAGNOSIS — I236 Thrombosis of atrium, auricular appendage, and ventricle as current complications following acute myocardial infarction: Secondary | ICD-10-CM | POA: Diagnosis not present

## 2017-09-05 DIAGNOSIS — Z7901 Long term (current) use of anticoagulants: Secondary | ICD-10-CM | POA: Diagnosis not present

## 2017-09-05 DIAGNOSIS — I10 Essential (primary) hypertension: Secondary | ICD-10-CM | POA: Diagnosis not present

## 2017-09-05 DIAGNOSIS — R2681 Unsteadiness on feet: Secondary | ICD-10-CM | POA: Diagnosis not present

## 2017-09-05 DIAGNOSIS — M6281 Muscle weakness (generalized): Secondary | ICD-10-CM | POA: Diagnosis not present

## 2017-09-05 DIAGNOSIS — I69154 Hemiplegia and hemiparesis following nontraumatic intracerebral hemorrhage affecting left non-dominant side: Secondary | ICD-10-CM | POA: Diagnosis not present

## 2017-09-05 DIAGNOSIS — E119 Type 2 diabetes mellitus without complications: Secondary | ICD-10-CM | POA: Diagnosis not present

## 2017-09-05 DIAGNOSIS — I4891 Unspecified atrial fibrillation: Secondary | ICD-10-CM | POA: Diagnosis not present

## 2017-09-05 DIAGNOSIS — F0280 Dementia in other diseases classified elsewhere without behavioral disturbance: Secondary | ICD-10-CM | POA: Diagnosis not present

## 2017-09-05 DIAGNOSIS — J9620 Acute and chronic respiratory failure, unspecified whether with hypoxia or hypercapnia: Secondary | ICD-10-CM | POA: Diagnosis not present

## 2017-09-05 DIAGNOSIS — R1312 Dysphagia, oropharyngeal phase: Secondary | ICD-10-CM | POA: Diagnosis not present

## 2017-09-07 DIAGNOSIS — I236 Thrombosis of atrium, auricular appendage, and ventricle as current complications following acute myocardial infarction: Secondary | ICD-10-CM | POA: Diagnosis not present

## 2017-09-07 DIAGNOSIS — R296 Repeated falls: Secondary | ICD-10-CM | POA: Diagnosis not present

## 2017-09-07 DIAGNOSIS — R1312 Dysphagia, oropharyngeal phase: Secondary | ICD-10-CM | POA: Diagnosis not present

## 2017-09-07 DIAGNOSIS — J9620 Acute and chronic respiratory failure, unspecified whether with hypoxia or hypercapnia: Secondary | ICD-10-CM | POA: Diagnosis not present

## 2017-09-07 DIAGNOSIS — M6281 Muscle weakness (generalized): Secondary | ICD-10-CM | POA: Diagnosis not present

## 2017-09-07 DIAGNOSIS — I69154 Hemiplegia and hemiparesis following nontraumatic intracerebral hemorrhage affecting left non-dominant side: Secondary | ICD-10-CM | POA: Diagnosis not present

## 2017-09-07 DIAGNOSIS — N184 Chronic kidney disease, stage 4 (severe): Secondary | ICD-10-CM | POA: Diagnosis not present

## 2017-09-07 DIAGNOSIS — R2681 Unsteadiness on feet: Secondary | ICD-10-CM | POA: Diagnosis not present

## 2017-09-07 DIAGNOSIS — F0280 Dementia in other diseases classified elsewhere without behavioral disturbance: Secondary | ICD-10-CM | POA: Diagnosis not present

## 2017-09-08 DIAGNOSIS — R2681 Unsteadiness on feet: Secondary | ICD-10-CM | POA: Diagnosis not present

## 2017-09-08 DIAGNOSIS — R1312 Dysphagia, oropharyngeal phase: Secondary | ICD-10-CM | POA: Diagnosis not present

## 2017-09-08 DIAGNOSIS — J9620 Acute and chronic respiratory failure, unspecified whether with hypoxia or hypercapnia: Secondary | ICD-10-CM | POA: Diagnosis not present

## 2017-09-08 DIAGNOSIS — F0280 Dementia in other diseases classified elsewhere without behavioral disturbance: Secondary | ICD-10-CM | POA: Diagnosis not present

## 2017-09-08 DIAGNOSIS — I69154 Hemiplegia and hemiparesis following nontraumatic intracerebral hemorrhage affecting left non-dominant side: Secondary | ICD-10-CM | POA: Diagnosis not present

## 2017-09-08 DIAGNOSIS — M6281 Muscle weakness (generalized): Secondary | ICD-10-CM | POA: Diagnosis not present

## 2017-09-11 DIAGNOSIS — F0280 Dementia in other diseases classified elsewhere without behavioral disturbance: Secondary | ICD-10-CM | POA: Diagnosis not present

## 2017-09-11 DIAGNOSIS — M6281 Muscle weakness (generalized): Secondary | ICD-10-CM | POA: Diagnosis not present

## 2017-09-11 DIAGNOSIS — R2681 Unsteadiness on feet: Secondary | ICD-10-CM | POA: Diagnosis not present

## 2017-09-11 DIAGNOSIS — R1312 Dysphagia, oropharyngeal phase: Secondary | ICD-10-CM | POA: Diagnosis not present

## 2017-09-11 DIAGNOSIS — I69154 Hemiplegia and hemiparesis following nontraumatic intracerebral hemorrhage affecting left non-dominant side: Secondary | ICD-10-CM | POA: Diagnosis not present

## 2017-09-11 DIAGNOSIS — J9620 Acute and chronic respiratory failure, unspecified whether with hypoxia or hypercapnia: Secondary | ICD-10-CM | POA: Diagnosis not present

## 2017-09-12 DIAGNOSIS — R1312 Dysphagia, oropharyngeal phase: Secondary | ICD-10-CM | POA: Diagnosis not present

## 2017-09-12 DIAGNOSIS — I4891 Unspecified atrial fibrillation: Secondary | ICD-10-CM | POA: Diagnosis not present

## 2017-09-12 DIAGNOSIS — R2681 Unsteadiness on feet: Secondary | ICD-10-CM | POA: Diagnosis not present

## 2017-09-12 DIAGNOSIS — I219 Acute myocardial infarction, unspecified: Secondary | ICD-10-CM | POA: Diagnosis not present

## 2017-09-12 DIAGNOSIS — I482 Chronic atrial fibrillation: Secondary | ICD-10-CM | POA: Diagnosis not present

## 2017-09-12 DIAGNOSIS — F0280 Dementia in other diseases classified elsewhere without behavioral disturbance: Secondary | ICD-10-CM | POA: Diagnosis not present

## 2017-09-12 DIAGNOSIS — J9620 Acute and chronic respiratory failure, unspecified whether with hypoxia or hypercapnia: Secondary | ICD-10-CM | POA: Diagnosis not present

## 2017-09-12 DIAGNOSIS — I69154 Hemiplegia and hemiparesis following nontraumatic intracerebral hemorrhage affecting left non-dominant side: Secondary | ICD-10-CM | POA: Diagnosis not present

## 2017-09-12 DIAGNOSIS — M6281 Muscle weakness (generalized): Secondary | ICD-10-CM | POA: Diagnosis not present

## 2017-09-12 DIAGNOSIS — I236 Thrombosis of atrium, auricular appendage, and ventricle as current complications following acute myocardial infarction: Secondary | ICD-10-CM | POA: Diagnosis not present

## 2017-09-12 DIAGNOSIS — I63119 Cerebral infarction due to embolism of unspecified vertebral artery: Secondary | ICD-10-CM | POA: Diagnosis not present

## 2017-09-13 DIAGNOSIS — J9620 Acute and chronic respiratory failure, unspecified whether with hypoxia or hypercapnia: Secondary | ICD-10-CM | POA: Diagnosis not present

## 2017-09-13 DIAGNOSIS — F0280 Dementia in other diseases classified elsewhere without behavioral disturbance: Secondary | ICD-10-CM | POA: Diagnosis not present

## 2017-09-13 DIAGNOSIS — M6281 Muscle weakness (generalized): Secondary | ICD-10-CM | POA: Diagnosis not present

## 2017-09-13 DIAGNOSIS — I69154 Hemiplegia and hemiparesis following nontraumatic intracerebral hemorrhage affecting left non-dominant side: Secondary | ICD-10-CM | POA: Diagnosis not present

## 2017-09-13 DIAGNOSIS — R1312 Dysphagia, oropharyngeal phase: Secondary | ICD-10-CM | POA: Diagnosis not present

## 2017-09-13 DIAGNOSIS — R2681 Unsteadiness on feet: Secondary | ICD-10-CM | POA: Diagnosis not present

## 2017-09-19 DIAGNOSIS — F329 Major depressive disorder, single episode, unspecified: Secondary | ICD-10-CM | POA: Diagnosis not present

## 2017-09-19 DIAGNOSIS — F0390 Unspecified dementia without behavioral disturbance: Secondary | ICD-10-CM | POA: Diagnosis not present

## 2017-09-19 DIAGNOSIS — F419 Anxiety disorder, unspecified: Secondary | ICD-10-CM | POA: Diagnosis not present

## 2017-09-19 DIAGNOSIS — F39 Unspecified mood [affective] disorder: Secondary | ICD-10-CM | POA: Diagnosis not present

## 2017-09-20 ENCOUNTER — Inpatient Hospital Stay (HOSPITAL_COMMUNITY)
Admission: EM | Admit: 2017-09-20 | Discharge: 2017-10-10 | DRG: 004 | Disposition: A | Discharge status: Skilled Nursing Facility | Payer: Medicare Other | Attending: Internal Medicine | Admitting: Internal Medicine

## 2017-09-20 ENCOUNTER — Emergency Department (HOSPITAL_COMMUNITY): Payer: Medicare Other

## 2017-09-20 ENCOUNTER — Encounter (HOSPITAL_COMMUNITY): Payer: Self-pay | Admitting: Emergency Medicine

## 2017-09-20 ENCOUNTER — Other Ambulatory Visit: Payer: Self-pay

## 2017-09-20 ENCOUNTER — Inpatient Hospital Stay (HOSPITAL_COMMUNITY): Payer: Medicare Other

## 2017-09-20 DIAGNOSIS — I509 Heart failure, unspecified: Secondary | ICD-10-CM | POA: Diagnosis not present

## 2017-09-20 DIAGNOSIS — I5030 Unspecified diastolic (congestive) heart failure: Secondary | ICD-10-CM

## 2017-09-20 DIAGNOSIS — R4702 Dysphasia: Secondary | ICD-10-CM | POA: Diagnosis present

## 2017-09-20 DIAGNOSIS — R6521 Severe sepsis with septic shock: Secondary | ICD-10-CM | POA: Diagnosis not present

## 2017-09-20 DIAGNOSIS — R1310 Dysphagia, unspecified: Secondary | ICD-10-CM | POA: Diagnosis present

## 2017-09-20 DIAGNOSIS — Z8249 Family history of ischemic heart disease and other diseases of the circulatory system: Secondary | ICD-10-CM

## 2017-09-20 DIAGNOSIS — R0902 Hypoxemia: Secondary | ICD-10-CM | POA: Diagnosis not present

## 2017-09-20 DIAGNOSIS — B9562 Methicillin resistant Staphylococcus aureus infection as the cause of diseases classified elsewhere: Secondary | ICD-10-CM | POA: Diagnosis present

## 2017-09-20 DIAGNOSIS — I252 Old myocardial infarction: Secondary | ICD-10-CM

## 2017-09-20 DIAGNOSIS — J9600 Acute respiratory failure, unspecified whether with hypoxia or hypercapnia: Secondary | ICD-10-CM | POA: Diagnosis present

## 2017-09-20 DIAGNOSIS — Z95828 Presence of other vascular implants and grafts: Secondary | ICD-10-CM

## 2017-09-20 DIAGNOSIS — J9621 Acute and chronic respiratory failure with hypoxia: Secondary | ICD-10-CM | POA: Diagnosis present

## 2017-09-20 DIAGNOSIS — N179 Acute kidney failure, unspecified: Secondary | ICD-10-CM | POA: Diagnosis not present

## 2017-09-20 DIAGNOSIS — F0390 Unspecified dementia without behavioral disturbance: Secondary | ICD-10-CM | POA: Diagnosis not present

## 2017-09-20 DIAGNOSIS — Z5181 Encounter for therapeutic drug level monitoring: Secondary | ICD-10-CM | POA: Diagnosis not present

## 2017-09-20 DIAGNOSIS — N183 Chronic kidney disease, stage 3 (moderate): Secondary | ICD-10-CM | POA: Diagnosis present

## 2017-09-20 DIAGNOSIS — D638 Anemia in other chronic diseases classified elsewhere: Secondary | ICD-10-CM | POA: Diagnosis present

## 2017-09-20 DIAGNOSIS — A419 Sepsis, unspecified organism: Principal | ICD-10-CM | POA: Diagnosis present

## 2017-09-20 DIAGNOSIS — R0689 Other abnormalities of breathing: Secondary | ICD-10-CM | POA: Diagnosis not present

## 2017-09-20 DIAGNOSIS — S27309A Unspecified injury of lung, unspecified, initial encounter: Secondary | ICD-10-CM | POA: Diagnosis not present

## 2017-09-20 DIAGNOSIS — J15212 Pneumonia due to Methicillin resistant Staphylococcus aureus: Secondary | ICD-10-CM | POA: Diagnosis not present

## 2017-09-20 DIAGNOSIS — B9689 Other specified bacterial agents as the cause of diseases classified elsewhere: Secondary | ICD-10-CM | POA: Diagnosis present

## 2017-09-20 DIAGNOSIS — E872 Acidosis, unspecified: Secondary | ICD-10-CM

## 2017-09-20 DIAGNOSIS — I5022 Chronic systolic (congestive) heart failure: Secondary | ICD-10-CM

## 2017-09-20 DIAGNOSIS — J440 Chronic obstructive pulmonary disease with acute lower respiratory infection: Secondary | ICD-10-CM | POA: Diagnosis present

## 2017-09-20 DIAGNOSIS — R0602 Shortness of breath: Secondary | ICD-10-CM | POA: Diagnosis not present

## 2017-09-20 DIAGNOSIS — J189 Pneumonia, unspecified organism: Secondary | ICD-10-CM

## 2017-09-20 DIAGNOSIS — J9601 Acute respiratory failure with hypoxia: Secondary | ICD-10-CM | POA: Diagnosis not present

## 2017-09-20 DIAGNOSIS — Z7901 Long term (current) use of anticoagulants: Secondary | ICD-10-CM

## 2017-09-20 DIAGNOSIS — R4182 Altered mental status, unspecified: Secondary | ICD-10-CM | POA: Diagnosis not present

## 2017-09-20 DIAGNOSIS — I5023 Acute on chronic systolic (congestive) heart failure: Secondary | ICD-10-CM | POA: Diagnosis present

## 2017-09-20 DIAGNOSIS — R7881 Bacteremia: Secondary | ICD-10-CM | POA: Diagnosis present

## 2017-09-20 DIAGNOSIS — M549 Dorsalgia, unspecified: Secondary | ICD-10-CM | POA: Diagnosis not present

## 2017-09-20 DIAGNOSIS — G9341 Metabolic encephalopathy: Secondary | ICD-10-CM | POA: Diagnosis present

## 2017-09-20 DIAGNOSIS — B1920 Unspecified viral hepatitis C without hepatic coma: Secondary | ICD-10-CM | POA: Diagnosis present

## 2017-09-20 DIAGNOSIS — E785 Hyperlipidemia, unspecified: Secondary | ICD-10-CM | POA: Diagnosis present

## 2017-09-20 DIAGNOSIS — Z955 Presence of coronary angioplasty implant and graft: Secondary | ICD-10-CM

## 2017-09-20 DIAGNOSIS — L89153 Pressure ulcer of sacral region, stage 3: Secondary | ICD-10-CM | POA: Diagnosis not present

## 2017-09-20 DIAGNOSIS — I493 Ventricular premature depolarization: Secondary | ICD-10-CM | POA: Diagnosis not present

## 2017-09-20 DIAGNOSIS — Z452 Encounter for adjustment and management of vascular access device: Secondary | ICD-10-CM | POA: Diagnosis not present

## 2017-09-20 DIAGNOSIS — Z930 Tracheostomy status: Secondary | ICD-10-CM

## 2017-09-20 DIAGNOSIS — G9340 Encephalopathy, unspecified: Secondary | ICD-10-CM | POA: Diagnosis not present

## 2017-09-20 DIAGNOSIS — E1122 Type 2 diabetes mellitus with diabetic chronic kidney disease: Secondary | ICD-10-CM | POA: Diagnosis present

## 2017-09-20 DIAGNOSIS — Y95 Nosocomial condition: Secondary | ICD-10-CM | POA: Diagnosis present

## 2017-09-20 DIAGNOSIS — I255 Ischemic cardiomyopathy: Secondary | ICD-10-CM | POA: Diagnosis present

## 2017-09-20 DIAGNOSIS — J811 Chronic pulmonary edema: Secondary | ICD-10-CM

## 2017-09-20 DIAGNOSIS — I472 Ventricular tachycardia: Secondary | ICD-10-CM | POA: Diagnosis not present

## 2017-09-20 DIAGNOSIS — J9690 Respiratory failure, unspecified, unspecified whether with hypoxia or hypercapnia: Secondary | ICD-10-CM | POA: Diagnosis not present

## 2017-09-20 DIAGNOSIS — Z87892 Personal history of anaphylaxis: Secondary | ICD-10-CM | POA: Diagnosis not present

## 2017-09-20 DIAGNOSIS — Z9081 Acquired absence of spleen: Secondary | ICD-10-CM

## 2017-09-20 DIAGNOSIS — I248 Other forms of acute ischemic heart disease: Secondary | ICD-10-CM | POA: Diagnosis present

## 2017-09-20 DIAGNOSIS — J9819 Other pulmonary collapse: Secondary | ICD-10-CM | POA: Diagnosis not present

## 2017-09-20 DIAGNOSIS — K5900 Constipation, unspecified: Secondary | ICD-10-CM | POA: Diagnosis present

## 2017-09-20 DIAGNOSIS — L89611 Pressure ulcer of right heel, stage 1: Secondary | ICD-10-CM | POA: Diagnosis present

## 2017-09-20 DIAGNOSIS — I2510 Atherosclerotic heart disease of native coronary artery without angina pectoris: Secondary | ICD-10-CM | POA: Diagnosis not present

## 2017-09-20 DIAGNOSIS — I69354 Hemiplegia and hemiparesis following cerebral infarction affecting left non-dominant side: Secondary | ICD-10-CM | POA: Diagnosis not present

## 2017-09-20 DIAGNOSIS — E875 Hyperkalemia: Secondary | ICD-10-CM | POA: Diagnosis present

## 2017-09-20 DIAGNOSIS — Z9911 Dependence on respirator [ventilator] status: Secondary | ICD-10-CM

## 2017-09-20 DIAGNOSIS — I340 Nonrheumatic mitral (valve) insufficiency: Secondary | ICD-10-CM | POA: Diagnosis not present

## 2017-09-20 DIAGNOSIS — Z96642 Presence of left artificial hip joint: Secondary | ICD-10-CM | POA: Diagnosis present

## 2017-09-20 DIAGNOSIS — J9382 Other air leak: Secondary | ICD-10-CM | POA: Diagnosis not present

## 2017-09-20 DIAGNOSIS — I513 Intracardiac thrombosis, not elsewhere classified: Secondary | ICD-10-CM | POA: Diagnosis present

## 2017-09-20 DIAGNOSIS — T50902A Poisoning by unspecified drugs, medicaments and biological substances, intentional self-harm, initial encounter: Secondary | ICD-10-CM | POA: Diagnosis present

## 2017-09-20 DIAGNOSIS — I5021 Acute systolic (congestive) heart failure: Secondary | ICD-10-CM | POA: Diagnosis present

## 2017-09-20 DIAGNOSIS — Z0189 Encounter for other specified special examinations: Secondary | ICD-10-CM

## 2017-09-20 DIAGNOSIS — Z888 Allergy status to other drugs, medicaments and biological substances status: Secondary | ICD-10-CM

## 2017-09-20 DIAGNOSIS — Z430 Encounter for attention to tracheostomy: Secondary | ICD-10-CM

## 2017-09-20 DIAGNOSIS — Z79899 Other long term (current) drug therapy: Secondary | ICD-10-CM

## 2017-09-20 DIAGNOSIS — R918 Other nonspecific abnormal finding of lung field: Secondary | ICD-10-CM | POA: Diagnosis not present

## 2017-09-20 DIAGNOSIS — Z8781 Personal history of (healed) traumatic fracture: Secondary | ICD-10-CM

## 2017-09-20 DIAGNOSIS — L89152 Pressure ulcer of sacral region, stage 2: Secondary | ICD-10-CM | POA: Diagnosis not present

## 2017-09-20 DIAGNOSIS — E1165 Type 2 diabetes mellitus with hyperglycemia: Secondary | ICD-10-CM | POA: Diagnosis not present

## 2017-09-20 DIAGNOSIS — L8990 Pressure ulcer of unspecified site, unspecified stage: Secondary | ICD-10-CM | POA: Diagnosis present

## 2017-09-20 DIAGNOSIS — Z01818 Encounter for other preprocedural examination: Secondary | ICD-10-CM

## 2017-09-20 DIAGNOSIS — T884XXA Failed or difficult intubation, initial encounter: Secondary | ICD-10-CM | POA: Diagnosis present

## 2017-09-20 DIAGNOSIS — E878 Other disorders of electrolyte and fluid balance, not elsewhere classified: Secondary | ICD-10-CM | POA: Diagnosis not present

## 2017-09-20 DIAGNOSIS — J9811 Atelectasis: Secondary | ICD-10-CM | POA: Diagnosis not present

## 2017-09-20 DIAGNOSIS — J984 Other disorders of lung: Secondary | ICD-10-CM | POA: Diagnosis not present

## 2017-09-20 DIAGNOSIS — B967 Clostridium perfringens [C. perfringens] as the cause of diseases classified elsewhere: Secondary | ICD-10-CM | POA: Diagnosis not present

## 2017-09-20 DIAGNOSIS — J810 Acute pulmonary edema: Secondary | ICD-10-CM

## 2017-09-20 DIAGNOSIS — J90 Pleural effusion, not elsewhere classified: Secondary | ICD-10-CM | POA: Diagnosis not present

## 2017-09-20 DIAGNOSIS — E118 Type 2 diabetes mellitus with unspecified complications: Secondary | ICD-10-CM | POA: Diagnosis not present

## 2017-09-20 DIAGNOSIS — Z885 Allergy status to narcotic agent status: Secondary | ICD-10-CM

## 2017-09-20 DIAGNOSIS — E669 Obesity, unspecified: Secondary | ICD-10-CM | POA: Diagnosis present

## 2017-09-20 DIAGNOSIS — R197 Diarrhea, unspecified: Secondary | ICD-10-CM | POA: Diagnosis not present

## 2017-09-20 DIAGNOSIS — Z9981 Dependence on supplemental oxygen: Secondary | ICD-10-CM

## 2017-09-20 DIAGNOSIS — Z87891 Personal history of nicotine dependence: Secondary | ICD-10-CM

## 2017-09-20 DIAGNOSIS — I130 Hypertensive heart and chronic kidney disease with heart failure and stage 1 through stage 4 chronic kidney disease, or unspecified chronic kidney disease: Secondary | ICD-10-CM | POA: Diagnosis present

## 2017-09-20 DIAGNOSIS — I236 Thrombosis of atrium, auricular appendage, and ventricle as current complications following acute myocardial infarction: Secondary | ICD-10-CM | POA: Diagnosis not present

## 2017-09-20 DIAGNOSIS — Z4682 Encounter for fitting and adjustment of non-vascular catheter: Secondary | ICD-10-CM | POA: Diagnosis not present

## 2017-09-20 DIAGNOSIS — R4020 Unspecified coma: Secondary | ICD-10-CM | POA: Diagnosis not present

## 2017-09-20 DIAGNOSIS — Z6828 Body mass index (BMI) 28.0-28.9, adult: Secondary | ICD-10-CM

## 2017-09-20 DIAGNOSIS — D72829 Elevated white blood cell count, unspecified: Secondary | ICD-10-CM | POA: Diagnosis not present

## 2017-09-20 DIAGNOSIS — Z9289 Personal history of other medical treatment: Secondary | ICD-10-CM

## 2017-09-20 DIAGNOSIS — R5381 Other malaise: Secondary | ICD-10-CM | POA: Diagnosis present

## 2017-09-20 DIAGNOSIS — R748 Abnormal levels of other serum enzymes: Secondary | ICD-10-CM | POA: Diagnosis not present

## 2017-09-20 DIAGNOSIS — R509 Fever, unspecified: Secondary | ICD-10-CM | POA: Diagnosis not present

## 2017-09-20 LAB — BASIC METABOLIC PANEL
ANION GAP: 13 (ref 5–15)
BUN: 48 mg/dL — ABNORMAL HIGH (ref 6–20)
CALCIUM: 9.4 mg/dL (ref 8.9–10.3)
CO2: 19 mmol/L — ABNORMAL LOW (ref 22–32)
CREATININE: 2.66 mg/dL — AB (ref 0.61–1.24)
Chloride: 111 mmol/L (ref 101–111)
GFR calc non Af Amer: 24 mL/min — ABNORMAL LOW (ref >60)
GFR, EST AFRICAN AMERICAN: 28 mL/min — AB (ref >60)
Glucose, Bld: 166 mg/dL — ABNORMAL HIGH (ref 65–99)
Potassium: 5.3 mmol/L — ABNORMAL HIGH (ref 3.5–5.1)
SODIUM: 143 mmol/L (ref 135–145)

## 2017-09-20 LAB — BLOOD CULTURE ID PANEL (REFLEXED)
ACINETOBACTER BAUMANNII: NOT DETECTED
ACINETOBACTER BAUMANNII: NOT DETECTED
CANDIDA ALBICANS: NOT DETECTED
CANDIDA GLABRATA: NOT DETECTED
CANDIDA KRUSEI: NOT DETECTED
CANDIDA KRUSEI: NOT DETECTED
Candida albicans: NOT DETECTED
Candida glabrata: NOT DETECTED
Candida parapsilosis: NOT DETECTED
Candida parapsilosis: NOT DETECTED
Candida tropicalis: NOT DETECTED
Candida tropicalis: NOT DETECTED
ENTEROBACTER CLOACAE COMPLEX: NOT DETECTED
ENTEROBACTERIACEAE SPECIES: NOT DETECTED
ENTEROCOCCUS SPECIES: NOT DETECTED
ESCHERICHIA COLI: NOT DETECTED
ESCHERICHIA COLI: NOT DETECTED
Enterobacter cloacae complex: NOT DETECTED
Enterobacteriaceae species: NOT DETECTED
Enterococcus species: NOT DETECTED
HAEMOPHILUS INFLUENZAE: NOT DETECTED
Haemophilus influenzae: NOT DETECTED
Klebsiella oxytoca: NOT DETECTED
Klebsiella oxytoca: NOT DETECTED
Klebsiella pneumoniae: NOT DETECTED
Klebsiella pneumoniae: NOT DETECTED
LISTERIA MONOCYTOGENES: NOT DETECTED
LISTERIA MONOCYTOGENES: NOT DETECTED
METHICILLIN RESISTANCE: DETECTED — AB
NEISSERIA MENINGITIDIS: NOT DETECTED
Neisseria meningitidis: NOT DETECTED
PROTEUS SPECIES: NOT DETECTED
PSEUDOMONAS AERUGINOSA: NOT DETECTED
Proteus species: NOT DETECTED
Pseudomonas aeruginosa: NOT DETECTED
SERRATIA MARCESCENS: NOT DETECTED
STAPHYLOCOCCUS AUREUS BCID: DETECTED — AB
STAPHYLOCOCCUS SPECIES: DETECTED — AB
STREPTOCOCCUS AGALACTIAE: NOT DETECTED
STREPTOCOCCUS AGALACTIAE: NOT DETECTED
STREPTOCOCCUS PNEUMONIAE: NOT DETECTED
STREPTOCOCCUS PYOGENES: NOT DETECTED
Serratia marcescens: NOT DETECTED
Staphylococcus aureus (BCID): NOT DETECTED
Staphylococcus species: NOT DETECTED
Streptococcus pneumoniae: NOT DETECTED
Streptococcus pyogenes: NOT DETECTED
Streptococcus species: NOT DETECTED
Streptococcus species: NOT DETECTED

## 2017-09-20 LAB — BLOOD GAS, ARTERIAL
Acid-base deficit: 3.7 mmol/L — ABNORMAL HIGH (ref 0.0–2.0)
Bicarbonate: 20.8 mmol/L (ref 20.0–28.0)
DRAWN BY: 406621
FIO2: 90
LHR: 24 {breaths}/min
MECHVT: 470 mL
O2 SAT: 99.3 %
PATIENT TEMPERATURE: 98.6
PCO2 ART: 37.4 mmHg (ref 32.0–48.0)
PEEP: 10 cmH2O
PO2 ART: 274 mmHg — AB (ref 83.0–108.0)
pH, Arterial: 7.364 (ref 7.350–7.450)

## 2017-09-20 LAB — CBC
HCT: 36.7 % — ABNORMAL LOW (ref 39.0–52.0)
HEMOGLOBIN: 12.1 g/dL — AB (ref 13.0–17.0)
MCH: 33.2 pg (ref 26.0–34.0)
MCHC: 33 g/dL (ref 30.0–36.0)
MCV: 100.8 fL — ABNORMAL HIGH (ref 78.0–100.0)
PLATELETS: 236 10*3/uL (ref 150–400)
RBC: 3.64 MIL/uL — AB (ref 4.22–5.81)
RDW: 14.7 % (ref 11.5–15.5)
WBC: 19 10*3/uL — ABNORMAL HIGH (ref 4.0–10.5)

## 2017-09-20 LAB — MAGNESIUM: MAGNESIUM: 1.8 mg/dL (ref 1.7–2.4)

## 2017-09-20 LAB — I-STAT ARTERIAL BLOOD GAS, ED
ACID-BASE DEFICIT: 4 mmol/L — AB (ref 0.0–2.0)
Acid-base deficit: 9 mmol/L — ABNORMAL HIGH (ref 0.0–2.0)
BICARBONATE: 17.6 mmol/L — AB (ref 20.0–28.0)
BICARBONATE: 20.6 mmol/L (ref 20.0–28.0)
O2 Saturation: 92 %
O2 Saturation: 97 %
PH ART: 7.272 — AB (ref 7.350–7.450)
PH ART: 7.375 (ref 7.350–7.450)
PO2 ART: 68 mmHg — AB (ref 83.0–108.0)
Patient temperature: 96.9
TCO2: 19 mmol/L — ABNORMAL LOW (ref 22–32)
TCO2: 22 mmol/L (ref 22–32)
pCO2 arterial: 37.8 mmHg (ref 32.0–48.0)
pCO2 arterial: 34.9 mmHg (ref 32.0–48.0)
pO2, Arterial: 93 mmHg (ref 83.0–108.0)

## 2017-09-20 LAB — I-STAT CG4 LACTIC ACID, ED
LACTIC ACID, VENOUS: 4.63 mmol/L — AB (ref 0.5–1.9)
Lactic Acid, Venous: 6.66 mmol/L (ref 0.5–1.9)

## 2017-09-20 LAB — DIFFERENTIAL
BASOS ABS: 0 10*3/uL (ref 0.0–0.1)
BLASTS: 0 %
Band Neutrophils: 23 %
Basophils Relative: 0 %
EOS ABS: 0 10*3/uL (ref 0.0–0.7)
EOS PCT: 0 %
Lymphocytes Relative: 9 %
Lymphs Abs: 1.7 10*3/uL (ref 0.7–4.0)
MONOS PCT: 4 %
Metamyelocytes Relative: 0 %
Monocytes Absolute: 0.8 10*3/uL (ref 0.1–1.0)
Myelocytes: 0 %
NEUTROS PCT: 64 %
Neutro Abs: 16.5 10*3/uL — ABNORMAL HIGH (ref 1.7–7.7)
Other: 0 %
Promyelocytes Absolute: 0 %
WBC MORPHOLOGY: INCREASED
nRBC: 0 /100 WBC

## 2017-09-20 LAB — HEPARIN LEVEL (UNFRACTIONATED): Heparin Unfractionated: 0.14 IU/mL — ABNORMAL LOW (ref 0.30–0.70)

## 2017-09-20 LAB — GLUCOSE, CAPILLARY
GLUCOSE-CAPILLARY: 154 mg/dL — AB (ref 65–99)
GLUCOSE-CAPILLARY: 138 mg/dL — AB (ref 65–99)
Glucose-Capillary: 108 mg/dL — ABNORMAL HIGH (ref 65–99)
Glucose-Capillary: 119 mg/dL — ABNORMAL HIGH (ref 65–99)
Glucose-Capillary: 141 mg/dL — ABNORMAL HIGH (ref 65–99)

## 2017-09-20 LAB — I-STAT TROPONIN, ED: TROPONIN I, POC: 2.52 ng/mL — AB (ref 0.00–0.08)

## 2017-09-20 LAB — PROTIME-INR
INR: 1.77
Prothrombin Time: 20.4 seconds — ABNORMAL HIGH (ref 11.4–15.2)

## 2017-09-20 LAB — TRIGLYCERIDES: TRIGLYCERIDES: 104 mg/dL (ref <150)

## 2017-09-20 LAB — TROPONIN I: Troponin I: 3.77 ng/mL (ref <0.03)

## 2017-09-20 LAB — BRAIN NATRIURETIC PEPTIDE: B NATRIURETIC PEPTIDE 5: 1771.9 pg/mL — AB (ref 0.0–100.0)

## 2017-09-20 LAB — MRSA PCR SCREENING: MRSA BY PCR: POSITIVE — AB

## 2017-09-20 LAB — PHOSPHORUS: PHOSPHORUS: 3.2 mg/dL (ref 2.5–4.6)

## 2017-09-20 MED ORDER — SODIUM CHLORIDE 0.9 % IV BOLUS (SEPSIS): 1000.0000 mL | Freq: Once | INTRAVENOUS | Status: DC

## 2017-09-20 MED ORDER — HEPARIN BOLUS VIA INFUSION
2000.0000 [IU] | Freq: Once | INTRAVENOUS | Status: AC
  Administered 2017-09-20: 2000 [IU] via INTRAVENOUS
  Filled 2017-09-20: qty 2000

## 2017-09-20 MED ORDER — PROPOFOL 1000 MG/100ML IV EMUL
INTRAVENOUS | Status: AC
  Filled 2017-09-20: qty 100

## 2017-09-20 MED ORDER — ASPIRIN 300 MG RE SUPP
300.0000 mg | Freq: Once | RECTAL | Status: AC
  Administered 2017-09-20: 300 mg via RECTAL
  Filled 2017-09-20: qty 1

## 2017-09-20 MED ORDER — ENOXAPARIN SODIUM 30 MG/0.3ML ~~LOC~~ SOLN
30.0000 mg | Freq: Every day | SUBCUTANEOUS | Status: DC
  Filled 2017-09-20: qty 0.3

## 2017-09-20 MED ORDER — PIPERACILLIN-TAZOBACTAM 3.375 G IVPB
3.3750 g | Freq: Three times a day (TID) | INTRAVENOUS | Status: DC
  Administered 2017-09-20 – 2017-09-21 (×3): 3.375 g via INTRAVENOUS
  Filled 2017-09-20 (×6): qty 50

## 2017-09-20 MED ORDER — PROPOFOL 1000 MG/100ML IV EMUL
5.0000 ug/kg/min | Freq: Once | INTRAVENOUS | Status: AC
  Administered 2017-09-20: 50 ug/kg/min via INTRAVENOUS

## 2017-09-20 MED ORDER — ETOMIDATE 2 MG/ML IV SOLN
INTRAVENOUS | Status: AC
  Filled 2017-09-20: qty 10

## 2017-09-20 MED ORDER — MUPIROCIN 2 % EX OINT
1.0000 "application " | TOPICAL_OINTMENT | Freq: Two times a day (BID) | CUTANEOUS | Status: AC
  Administered 2017-09-20 – 2017-09-24 (×10): 1 via NASAL
  Filled 2017-09-20: qty 22

## 2017-09-20 MED ORDER — FENTANYL CITRATE (PF) 100 MCG/2ML IJ SOLN
100.0000 ug | INTRAMUSCULAR | Status: DC | PRN
  Administered 2017-09-20 – 2017-09-27 (×14): 100 ug via INTRAVENOUS
  Administered 2017-09-27: 50 ug via INTRAVENOUS
  Administered 2017-09-29 – 2017-09-30 (×3): 100 ug via INTRAVENOUS
  Administered 2017-09-30: 50 ug via INTRAVENOUS
  Administered 2017-09-30 – 2017-10-05 (×16): 100 ug via INTRAVENOUS
  Administered 2017-10-05: 50 ug via INTRAVENOUS
  Administered 2017-10-06 – 2017-10-10 (×9): 100 ug via INTRAVENOUS
  Filled 2017-09-20 (×45): qty 2

## 2017-09-20 MED ORDER — SODIUM CHLORIDE 0.9 % IV BOLUS (SEPSIS)
750.0000 mL | Freq: Once | INTRAVENOUS | Status: AC
  Administered 2017-09-20: 750 mL via INTRAVENOUS

## 2017-09-20 MED ORDER — ORAL CARE MOUTH RINSE
15.0000 mL | Freq: Four times a day (QID) | OROMUCOSAL | Status: DC
  Administered 2017-09-20 – 2017-09-26 (×25): 15 mL via OROMUCOSAL

## 2017-09-20 MED ORDER — VANCOMYCIN HCL IN DEXTROSE 750-5 MG/150ML-% IV SOLN
750.0000 mg | Freq: Once | INTRAVENOUS | Status: AC
  Administered 2017-09-20: 750 mg via INTRAVENOUS
  Filled 2017-09-20: qty 150

## 2017-09-20 MED ORDER — VANCOMYCIN HCL IN DEXTROSE 1-5 GM/200ML-% IV SOLN
1000.0000 mg | Freq: Once | INTRAVENOUS | Status: AC
  Administered 2017-09-20: 1000 mg via INTRAVENOUS
  Filled 2017-09-20: qty 200

## 2017-09-20 MED ORDER — CHLORHEXIDINE GLUCONATE 0.12% ORAL RINSE (MEDLINE KIT)
15.0000 mL | Freq: Two times a day (BID) | OROMUCOSAL | Status: DC
  Administered 2017-09-20 – 2017-09-26 (×14): 15 mL via OROMUCOSAL

## 2017-09-20 MED ORDER — ETOMIDATE 2 MG/ML IV SOLN
INTRAVENOUS | Status: AC | PRN
  Administered 2017-09-20: 30 mg via INTRAVENOUS

## 2017-09-20 MED ORDER — DEXTROSE 5 % IV SOLN
2.0000 g | Freq: Once | INTRAVENOUS | Status: AC
  Administered 2017-09-20: 2 g via INTRAVENOUS
  Filled 2017-09-20: qty 2

## 2017-09-20 MED ORDER — HEPARIN BOLUS VIA INFUSION
4000.0000 [IU] | Freq: Once | INTRAVENOUS | Status: AC
  Administered 2017-09-20: 4000 [IU] via INTRAVENOUS
  Filled 2017-09-20: qty 4000

## 2017-09-20 MED ORDER — SODIUM CHLORIDE 0.9 % IV BOLUS (SEPSIS)
250.0000 mL | Freq: Once | INTRAVENOUS | Status: AC
  Administered 2017-09-20: 250 mL via INTRAVENOUS

## 2017-09-20 MED ORDER — ASPIRIN 300 MG RE SUPP: 300.0000 mg | RECTAL | Status: AC

## 2017-09-20 MED ORDER — VITAL HIGH PROTEIN PO LIQD
1000.0000 mL | ORAL | Status: DC
  Administered 2017-09-20: 1000 mL
  Administered 2017-09-21 (×7)

## 2017-09-20 MED ORDER — SUCCINYLCHOLINE CHLORIDE 20 MG/ML IJ SOLN
INTRAMUSCULAR | Status: AC | PRN
  Administered 2017-09-20: 120 mg via INTRAVENOUS
  Administered 2017-09-20: 100 mg via INTRAVENOUS

## 2017-09-20 MED ORDER — SODIUM CHLORIDE 0.9 % IV BOLUS (SEPSIS)
500.0000 mL | Freq: Once | INTRAVENOUS | Status: AC
  Administered 2017-09-20: 500 mL via INTRAVENOUS

## 2017-09-20 MED ORDER — CHLORHEXIDINE GLUCONATE CLOTH 2 % EX PADS
6.0000 | MEDICATED_PAD | Freq: Every day | CUTANEOUS | Status: AC
  Administered 2017-09-20 – 2017-09-24 (×5): 6 via TOPICAL

## 2017-09-20 MED ORDER — HEPARIN (PORCINE) IN NACL 100-0.45 UNIT/ML-% IJ SOLN
2500.0000 [IU]/h | INTRAMUSCULAR | Status: DC
  Administered 2017-09-20: 1350 [IU]/h via INTRAVENOUS
  Administered 2017-09-21: 1650 [IU]/h via INTRAVENOUS
  Administered 2017-09-21: 2100 [IU]/h via INTRAVENOUS
  Administered 2017-09-22: 2450 [IU]/h via INTRAVENOUS
  Administered 2017-09-22: 2250 [IU]/h via INTRAVENOUS
  Administered 2017-09-23 – 2017-09-24 (×2): 2450 [IU]/h via INTRAVENOUS
  Administered 2017-09-24 – 2017-09-28 (×8): 2800 [IU]/h via INTRAVENOUS
  Administered 2017-09-28: 2500 [IU]/h via INTRAVENOUS
  Filled 2017-09-20 (×27): qty 250

## 2017-09-20 MED ORDER — FUROSEMIDE 10 MG/ML IJ SOLN
80.0000 mg | Freq: Two times a day (BID) | INTRAMUSCULAR | Status: DC
Start: 2017-09-20 — End: 2017-09-20
  Filled 2017-09-20: qty 8

## 2017-09-20 MED ORDER — FAMOTIDINE IN NACL 20-0.9 MG/50ML-% IV SOLN
20.0000 mg | Freq: Two times a day (BID) | INTRAVENOUS | Status: DC
  Administered 2017-09-20 – 2017-09-22 (×5): 20 mg via INTRAVENOUS
  Filled 2017-09-20 (×5): qty 50

## 2017-09-20 MED ORDER — FUROSEMIDE 10 MG/ML IJ SOLN: 40.0000 mg | Freq: Once | INTRAMUSCULAR | Status: AC

## 2017-09-20 MED ORDER — ETOMIDATE 2 MG/ML IV SOLN
INTRAVENOUS | Status: AC
  Filled 2017-09-20: qty 20

## 2017-09-20 MED ORDER — ETOMIDATE 2 MG/ML IV SOLN
INTRAVENOUS | Status: AC | PRN
  Administered 2017-09-20 (×2): 15 mg via INTRAVENOUS

## 2017-09-20 MED ORDER — ASPIRIN 81 MG PO CHEW: 324.0000 mg | CHEWABLE_TABLET | ORAL | Status: AC

## 2017-09-20 MED ORDER — PROPOFOL 1000 MG/100ML IV EMUL
0.0000 ug/kg/min | INTRAVENOUS | Status: DC
  Administered 2017-09-20 (×2): 30 ug/kg/min via INTRAVENOUS
  Administered 2017-09-20: 40.816 ug/kg/min via INTRAVENOUS
  Administered 2017-09-21: 20 ug/kg/min via INTRAVENOUS
  Administered 2017-09-21: 10 ug/kg/min via INTRAVENOUS
  Administered 2017-09-22: 20 ug/kg/min via INTRAVENOUS
  Filled 2017-09-20 (×5): qty 100

## 2017-09-20 MED ORDER — SODIUM CHLORIDE 0.9 % IV SOLN
250.0000 mL | INTRAVENOUS | Status: DC | PRN
  Administered 2017-09-20 – 2017-10-07 (×5): 250 mL via INTRAVENOUS

## 2017-09-20 MED ORDER — FENTANYL CITRATE (PF) 100 MCG/2ML IJ SOLN
100.0000 ug | INTRAMUSCULAR | Status: DC | PRN
  Administered 2017-09-24: 100 ug via INTRAVENOUS
  Filled 2017-09-20 (×3): qty 2

## 2017-09-20 MED ORDER — INSULIN ASPART 100 UNIT/ML ~~LOC~~ SOLN
0.0000 [IU] | SUBCUTANEOUS | Status: DC
  Administered 2017-09-21 – 2017-09-27 (×11): 2 [IU] via SUBCUTANEOUS
  Administered 2017-09-27 (×2): 3 [IU] via SUBCUTANEOUS
  Administered 2017-09-27 – 2017-09-28 (×2): 2 [IU] via SUBCUTANEOUS
  Administered 2017-09-28: 3 [IU] via SUBCUTANEOUS
  Administered 2017-09-28 – 2017-10-09 (×18): 2 [IU] via SUBCUTANEOUS

## 2017-09-20 MED ORDER — VANCOMYCIN HCL IN DEXTROSE 750-5 MG/150ML-% IV SOLN
750.0000 mg | INTRAVENOUS | Status: DC
  Administered 2017-09-21 – 2017-09-28 (×8): 750 mg via INTRAVENOUS
  Filled 2017-09-20 (×9): qty 150

## 2017-09-20 MED ORDER — PRO-STAT SUGAR FREE PO LIQD
30.0000 mL | Freq: Two times a day (BID) | ORAL | Status: DC
  Administered 2017-09-20 – 2017-09-25 (×12): 30 mL
  Filled 2017-09-20 (×13): qty 30

## 2017-09-20 NOTE — ED Notes (Signed)
Dr Elesa Massed given a copy of lactic acid  Results 6.66

## 2017-09-20 NOTE — ED Triage Notes (Signed)
Pt brought to ED by [facility] from [facility] for co SOB SPO2 55% on RAN and 76% on CPAP, hx of CHF and COPD, denies any pain. VS for EMS HR 130, BP 176/90, Afib on the monitor.

## 2017-09-20 NOTE — Progress Notes (Signed)
Pharmacy Antibiotic Note  Curtis Keller is a 61 y.o. male admitted on 09/20/2017 with SOB requiring intubation, possible PNA.  Pharmacy has been consulted for Vancomycin and Zosyn  Dosing.  Vancomycin 1 g IV given in ED at  0415  Plan: Vancomycin 750 mg IV q24h Zosyn 3.375 g IV q8h   Height: 6' (182.9 cm) Weight: 216 lb (98 kg) IBW/kg (Calculated) : 77.6  Temp (24hrs), Avg:98.2 F (36.8 C), Min:98.2 F (36.8 C), Max:98.2 F (36.8 C)  Recent Labs  Lab 09/20/17 0321 09/20/17 0352 09/20/17 0545 09/20/17 0604  WBC  --   --  19.0*  --   CREATININE 2.66*  --   --   --   LATICACIDVEN  --  6.66*  --  4.63*    Estimated Creatinine Clearance: 34.9 mL/min (A) (by C-G formula based on SCr of 2.66 mg/dL (H)).    Allergies  Allergen Reactions  . Codeine Anaphylaxis and Nausea And Vomiting  . Lisinopril Itching and Rash  . Hydrocodone Other (See Comments)    On MAR  . Hydrocodone-Acetaminophen Nausea And Vomiting    Cannot take any acetaminophen due to liver issues per sister  . Tegaderm Ag Mesh [Silver] Other (See Comments)    Allergy to "silver compounds" listed on MAR  . Tylenol [Acetaminophen] Nausea Only and Other (See Comments)    Cannot take any acetaminophen due to liver issues per sister    Eddie Candle 09/20/2017 7:22 AM

## 2017-09-20 NOTE — Progress Notes (Signed)
ANTICOAGULATION CONSULT NOTE Pharmacy Consult for heparin Indication: history of atrial thrombus  Allergies  Allergen Reactions  . Codeine Anaphylaxis and Nausea And Vomiting  . Lisinopril Itching and Rash  . Hydrocodone Other (See Comments)    On MAR  . Hydrocodone-Acetaminophen Nausea And Vomiting    Cannot take any acetaminophen due to liver issues per sister  . Tegaderm Ag Mesh [Silver] Other (See Comments)    Allergy to "silver compounds" listed on MAR  . Tylenol [Acetaminophen] Nausea Only and Other (See Comments)    Cannot take any acetaminophen due to liver issues per sister    Patient Measurements: Height: 6' (182.9 cm) Weight: 220 lb 0.3 oz (99.8 kg) IBW/kg (Calculated) : 77.6 Heparin Dosing Weight: 97 kg  Vital Signs: Temp: 97.9 F (36.6 C) (11/28 1945) Temp Source: Oral (11/28 1945) BP: 103/75 (11/28 1900) Pulse Rate: 91 (11/28 1900)  Labs: Recent Labs    09/20/17 0321 09/20/17 0545 09/20/17 0559 09/20/17 2041  HGB  --  12.1*  --   --   HCT  --  36.7*  --   --   PLT  --  236  --   --   LABPROT 20.4*  --   --   --   INR 1.77  --   --   --   HEPARINUNFRC  --   --   --  0.14*  CREATININE 2.66*  --   --   --   TROPONINI  --   --  3.77*  --      Medical History: Past Medical History:  Diagnosis Date  . Acute respiratory failure (HCC)    a. Spring 2017 following fall/splenectomy -->admitted to [facility]-->trach/g tube.  . Anemia    a. 12/2015 ABL in setting of fall/hematomas/splenic laceration req splenectomy.  Marland Kitchen. Anxiety   . Apical mural thrombus    a. 11/2015 Echo: EF 25-30%, moderate size apical thrombus-->coumadin;  b. 12/2015 f/u Echo: EF 25-30%, ant/septa HK, no obvious large thrombus but cannot exclude small mural thrombus.  Marland Kitchen. CAD (coronary artery disease)    a. 12/2014 s/p BMS to the RCA.  Marland Kitchen. Cerebral infarction (HCC)   . Chronic bronchitis (HCC)    "probably"/sister 04/07/2017  . Chronic systolic CHF (congestive heart failure) (HCC)     a. 12/2015 Echo: EF 25-30%.  . Dementia   . Depression   . Dysphagia   . Falls    a. 11/2015 traumatic fall with resultant trauma req splenectomy and prolonged hospitalization complicated by resp failure.  . Hepatitis C    "never treated" (04/07/2017)  . History of blood transfusion 12/2015; 04/07/2017   "related to spleen OR; related to low HgB"   . History of gout   . Hyperlipidemia   . Hypertension   . Ischemic cardiomyopathy    a. 12/2015 Echo: EF 25-30%, anterior and septal HK.  Marland Kitchen. Left hemiplegia (HCC)   . Myocardial infarction (HCC) 1999; ?2010  . On home oxygen therapy    "28% at the SNF" (6/15/2018Z)  . Pneumonia    "several times since 12/19/2015" (6/15/20180)  . Protein calorie malnutrition (HCC)   . Respiratory failure (HCC)   . Spleen injury   . Status post tracheostomy (HCC)    a. 01/2016 in setting of ongoing resp failure and aspiration.  . Stroke (HCC) 12/19/2015   "left side paralyzed since" (04/07/2017)  . Type II diabetes mellitus (HCC)     Assessment: 61 yo male admitted with SOB and  AMS. Pt is on warfarin prior to admission due to a history of apical thrombus and CVA. Now on heparin while undergoing workup.  -Initial heparin level= 0.14   Goal of Therapy:  Heparin level 0.3-0.7 units/ml Monitor platelets by anticoagulation protocol: Yes    Plan:   -heparin bolus 2000 units then increase to 1650 units/hr -Heparin level in 8 hours and daily wth CBC daily  Harland German, Pharm D 09/20/2017 9:20 PM

## 2017-09-20 NOTE — ED Notes (Signed)
Pt is mostly fluid overload on his second chest x ray, rather than lung infection reason why pt wasn't started on fluid sepsis protocol on the beginning per Dr. Elesa Massed and ICU provider, pt pending to be diurese when BP is more controlled.

## 2017-09-20 NOTE — ED Notes (Signed)
A copy of lactic acid results given to RN Windy Kalata and Dr Elesa Massed

## 2017-09-20 NOTE — Code Documentation (Signed)
Multiple attempt made to place a tube on the pt with no success, anesthesia and surgeon consulted for a tracheostomy placement.

## 2017-09-20 NOTE — Progress Notes (Signed)
Pt transported on vent to CT and then to 2M16.  Pt's vitals remained stable throughout the trip.  Report given to ICU RT. ICU RT in pt's room upon arrival.

## 2017-09-20 NOTE — ED Notes (Signed)
Tim - Chaplain, returned the page and stated that he is in an End of Life on 6[place] and can not make it down right away, but will come down as soon as he is finished with the pt and family on 6N.  

## 2017-09-20 NOTE — Progress Notes (Signed)
PHARMACY - PHYSICIAN COMMUNICATION CRITICAL VALUE ALERT - BLOOD CULTURE IDENTIFICATION (BCID)  Results for orders placed or performed during the hospital encounter of 09/20/17  Blood Culture ID Panel (Reflexed) (Collected: 09/20/2017  3:40 AM)  Result Value Ref Range   Enterococcus species NOT DETECTED NOT DETECTED   Listeria monocytogenes NOT DETECTED NOT DETECTED   Staphylococcus species NOT DETECTED NOT DETECTED   Staphylococcus aureus NOT DETECTED NOT DETECTED   Streptococcus species NOT DETECTED NOT DETECTED   Streptococcus agalactiae NOT DETECTED NOT DETECTED   Streptococcus pneumoniae NOT DETECTED NOT DETECTED   Streptococcus pyogenes NOT DETECTED NOT DETECTED   Acinetobacter baumannii NOT DETECTED NOT DETECTED   Enterobacteriaceae species NOT DETECTED NOT DETECTED   Enterobacter cloacae complex NOT DETECTED NOT DETECTED   Escherichia coli NOT DETECTED NOT DETECTED   Klebsiella oxytoca NOT DETECTED NOT DETECTED   Klebsiella pneumoniae NOT DETECTED NOT DETECTED   Proteus species NOT DETECTED NOT DETECTED   Serratia marcescens NOT DETECTED NOT DETECTED   Haemophilus influenzae NOT DETECTED NOT DETECTED   Neisseria meningitidis NOT DETECTED NOT DETECTED   Pseudomonas aeruginosa NOT DETECTED NOT DETECTED   Candida albicans NOT DETECTED NOT DETECTED   Candida glabrata NOT DETECTED NOT DETECTED   Candida krusei NOT DETECTED NOT DETECTED   Candida parapsilosis NOT DETECTED NOT DETECTED   Candida tropicalis NOT DETECTED NOT DETECTED   On zosyn and vancomycin for PNA.   Blood Culture results: GPR (reported in 1/3 bottles)  Changes to prescribed antibiotics required: no changes  Harland German, Pharm D 09/20/2017 6:41 PM

## 2017-09-20 NOTE — ED Notes (Deleted)
Curtis Keller, returned the page and stated that he is in an End of Life on 6N and can not make it down right away, but will come down as soon as he is finished with the pt and family on 6N.

## 2017-09-20 NOTE — ED Provider Notes (Signed)
TIME SEEN: 3:30 AM  CHIEF COMPLAINT: Shortness of breath  HPI: Patient is a 61 year old male who comes from [facility] facility with shortness of breath and respiratory distress.  He has a documented history of COPD, CHF, CAD, previous stroke, apical mural thrombus on Coumadin, hypertension, diabetes, hyperlipidemia.  Reportedly patient was found in respiratory distress with sats of 55% on room air.  He does not wear oxygen chronically.  EMS placed patient on CPAP and his oxygen saturation improved to 76%.  Patient denies any recent fevers.  He denies chest pain.  ROS: Level 5 caveat secondary to respiratory distress  PAST MEDICAL HISTORY/PAST SURGICAL HISTORY:  Past Medical History:  Diagnosis Date  . Acute respiratory failure ([place])    a. Spring 2017 following fall/splenectomy -->admitted to [facility]-->trach/g tube.  . Anemia    a. 12/2015 ABL in setting of fall/hematomas/splenic laceration req splenectomy.  Marland Kitchen Anxiety   . Apical mural thrombus    a. 11/2015 Echo: EF 25-30%, moderate size apical thrombus-->coumadin;  b. 12/2015 f/u Echo: EF 25-30%, ant/septa HK, no obvious large thrombus but cannot exclude small mural thrombus.  Marland Kitchen CAD (coronary artery disease)    a. 12/2014 s/p BMS to the RCA.  Marland Kitchen Cerebral infarction ([place])   . Chronic bronchitis ([place])    "probably"/sister 04/07/2017  . Chronic systolic CHF (congestive heart failure) ([place])    a. 12/2015 Echo: EF 25-30%.  . Dementia   . Depression   . Dysphagia   . Falls    a. 11/2015 traumatic fall with resultant trauma req splenectomy and prolonged hospitalization complicated by resp failure.  . Hepatitis C    "never treated" (04/07/2017)  . History of blood transfusion 12/2015; 04/07/2017   "related to spleen OR; related to low HgB"   . History of gout   . Hyperlipidemia   . Hypertension   . Ischemic cardiomyopathy    a. 12/2015 Echo: EF 25-30%, anterior and septal HK.  Marland Kitchen Left hemiplegia ([place])   . Myocardial  infarction ([place]) 1999; ?2010  . On home oxygen therapy    "28% at the SNF" (6/15/2018Z)  . Pneumonia    "several times since 12/19/2015" (6/15/20180)  . Protein calorie malnutrition ([place])   . Respiratory failure ([place])   . Spleen injury   . Status post tracheostomy ([place])    a. 01/2016 in setting of ongoing resp failure and aspiration.  . Stroke ([place]) 12/19/2015   "left side paralyzed since" (04/07/2017)  . Type II diabetes mellitus ([facility])     MEDICATIONS:  Prior to Admission medications   Medication Sig Start Date End Date Taking? Authorizing Provider  acetylcysteine (MUCOMYST) 20 % nebulizer solution Take 4 mLs by nebulization 2 (two) times daily. 04/13/17   Rai, Ripudeep Raliegh Ip, MD  albuterol (PROVENTIL) (2.5 MG/3ML) 0.083% nebulizer solution Take 3 mLs (2.5 mg total) by nebulization every 4 (four) hours as needed for wheezing or shortness of breath. 04/13/17   Rai, Ripudeep K, MD  Amino Acids-Protein Hydrolys (FEEDING SUPPLEMENT, PRO-STAT SUGAR FREE 64,) LIQD Place 60 mLs into feeding tube 2 (two) times daily.     [provider]  atorvastatin (LIPITOR) 40 MG tablet Place 40 mg into feeding tube at bedtime. for Lipids    [provider]  baclofen (LIORESAL) 10 MG tablet Take 5-10 mg by mouth See admin instructions. Take 5 mg to 10 mg three times a day for spasms    [provider]  bisacodyl (DULCOLAX) 10 MG suppository Place  1 suppository (10 mg total) rectally daily as needed for moderate constipation. 04/13/17   Rai, Ripudeep K, MD  busPIRone (BUSPAR) 5 MG tablet Take 5 mg by mouth 2 (two) times daily.    [provider]  chlorhexidine (PERIDEX) 0.12 % solution Use as directed 15 mLs in the mouth or throat 4 (four) times daily. 05/06/17   Debbe Odea, MD  Chlorhexidine Gluconate Cloth 2 % PADS Apply 6 each topically daily at 6 (six) AM. 05/08/17   Debbe Odea, MD  enoxaparin (LOVENOX) 100 MG/ML injection Inject 1 mL (100 mg total) into the skin 2 (two) times  daily. Continue lovenox bridge until INR therapeutic stable between 2-3, then off 04/13/17   Rai, Ripudeep K, MD  famotidine (PEPCID) 20 MG tablet Place 20 mg into feeding tube 2 (two) times daily. for GERD    [provider]  folic acid (FOLVITE) 1 MG tablet Place 1 mg into feeding tube daily.     [provider]  guaiFENesin 200 MG tablet Place 200 mg into feeding tube 3 (three) times daily. for CHF    [provider]  ipratropium-albuterol (DUONEB) 0.5-2.5 (3) MG/3ML SOLN Take 3 mLs by nebulization 4 (four) times daily. 04/13/17   Rai, Ripudeep K, MD  lidocaine (LIDODERM) 5 % Place 2 patches onto the skin See admin instructions. Apply 2 patches to left knee every morning -remove & discard patch within 12 hours or as directed by MD 04/13/17   Rai, Vernelle Emerald, MD  metoprolol tartrate (LOPRESSOR) 25 MG tablet Place 25 mg into feeding tube 2 (two) times daily.    [provider]  Multiple Vitamin (MULTIVITAMIN WITH MINERALS) TABS tablet Place 1 tablet into feeding tube daily.    [provider]  mupirocin ointment (BACTROBAN) 2 % Place 1 application into the nose 2 (two) times daily. 05/07/17   Debbe Odea, MD  Nutritional Supplements (FEEDING SUPPLEMENT, OSMOLITE 1.5 CAL,) LIQD Place 1,000 mLs into feeding tube See admin instructions. Run at 80 cc/hr 8 hrs via peg tube. Start at 1600 and stop  @@ 10 am two times a day related to tracheostomy status    [provider]  Omega-3 Fatty Acids (FISH OIL) 1000 MG CAPS Place 1,000 mg into feeding tube daily.    [provider]  polyethylene glycol (MIRALAX / GLYCOLAX) packet Place 17 g into feeding tube daily. for constipation    [provider]  senna (SENOKOT) 8.6 MG TABS tablet Place 1 tablet (8.6 mg total) into feeding tube daily as needed for mild constipation. For constipation 04/13/17   Rai, Vernelle Emerald, MD  tamsulosin (FLOMAX) 0.4 MG CAPS capsule 0.4 mg daily at 6 PM. Give 1 capsule  (0.4 mg) via PEG-Tube one time daily    [provider]  vitamin B-12 1000 MCG tablet Place 1 tablet (1,000 mcg total) into feeding tube daily. 04/14/17   Rai, Vernelle Emerald, MD  warfarin (COUMADIN) 4 MG tablet Place 2 tablets (8 mg total) into feeding tube one time only at 6 PM. 05/07/17 05/08/17  Debbe Odea, MD  Water For Irrigation, Sterile (FREE WATER) SOLN Place 150 mLs into feeding tube every 4 (four) hours. 03/30/17   Reyne Dumas, MD    ALLERGIES:  Allergies  Allergen Reactions  . Codeine Anaphylaxis and Nausea And Vomiting  . Lisinopril Itching and Rash  . Hydrocodone Other (See Comments)    On MAR  . Hydrocodone-Acetaminophen Nausea And Vomiting    Cannot take any acetaminophen  due to liver issues per sister  . Tegaderm Ag Mesh [Silver] Other (See Comments)    Allergy to "silver compounds" listed on MAR  . Tylenol [Acetaminophen] Nausea Only and Other (See Comments)    Cannot take any acetaminophen due to liver issues per sister    SOCIAL HISTORY:  Social History   Tobacco Use  . Smoking status: Former Smoker    Packs/day: 2.00    Years: 38.00    Pack years: 76.00    Types: Cigarettes    Last attempt to quit: 11/25/2015    Years since quitting: 1.8  . Smokeless tobacco: Never Used  . Tobacco comment: quit spring of 2017.  Substance Use Topics  . Alcohol use: No    Alcohol/week: 0.0 oz    FAMILY HISTORY: Family History  Problem Relation Age of Onset  . Alcohol abuse Mother   . Hypertension Mother   . Coronary artery disease Mother   . Alcohol abuse Father   . Hypertension Father   . Coronary artery disease Father   . Alcohol abuse Maternal Uncle     EXAM: BP (!) 145/100   Pulse (!) 136   Temp 98.2 F (36.8 C) (Rectal)   Resp (!) 26   Ht 6' (1.829 m)   Wt 98 kg (216 lb)   SpO2 100%   BMI 29.29 kg/m  CONSTITUTIONAL: Alert but unable to answer all questions as he cannot talk due to respiratory distress.  He can nod and shake his head.  He is  afebrile. HEAD: Normocephalic EYES: Conjunctivae clear, pupils appear equal, EOMI ENT: normal nose; moist mucous membranes NECK: Supple, no meningismus, no nuchal rigidity, no LAD  CARD: Ocular and tachycardic; S1 and S2 appreciated; no murmurs, no clicks, no rubs, no gallops RESP: Patient is hypoxic on CPAP.  He has increased work of breathing and severe respiratory distress.  He is tachypneic.  He has rhonchorous breath sounds diffusely.  No wheezing. ABD/GI: Normal bowel sounds; non-distended; soft, non-tender, no rebound, no guarding, no peritoneal signs, no hepatosplenomegaly, reducible hernias noted to his lower abdomen, he has a G-tube without drainage or surrounding erythema and warmth BACK:  The back appears normal and is non-tender to palpation, there is no CVA tenderness EXT: Normal ROM in all joints; non-tender to palpation; no edema; normal capillary refill; no cyanosis, no calf tenderness or swelling    SKIN: Normal color for age and race; warm; no rash NEURO: Contractures of his left upper extremity which is baseline, normal movement of the right upper extremity and both legs   MEDICAL DECISION MAKING: Patient here with respiratory distress.  He does not appears significantly volume overloaded but does have an EF of 25%.  No wheezing heard on exam.  ABG shows a metabolic acidosis and no CO2 retention.  Rectal temperature is normal but chest x-ray shows possible edema versus pneumonia.  Will obtain labs, cultures, urine.  Will treat with broad-spectrum antibiotics given he is in a nursing facility.  He is hypertensive here.  Will hold on IV fluids at this time.  ED PROGRESS: Patient's lactate is elevated at 6.66.  I think that this could be a combination of infection versus respiratory failure.  I am very concerned about giving him IV fluids given his EF of 25% and that this could be pulmonary edema seen on his chest x-ray.   Clinically patient seems to be deteriorating.  He seemed  more tachypneic with respiratory rate in the 50s and sats of  86% despite an FiO2 of 90% on BiPAP.  Decision was made to intubate patient.  He did nod his head yes that he would want intubation.  Patient previously has had a tracheostomy which his sister states was placed after he had recurrent aspiration.  She states that it was only in for about 6-8 weeks.  Intubation was quite difficult.  We met a lot of resistance just past the vocal cords.  Unclear if there was some scar tissue from his previous trach.  Ultimately able to place a 6.5 endotracheal tube.  His repeat chest x-ray now looks more like volume overload.  Will give IV Lasix.  Discussed with Dr. Jimmy Footman with critical care.  They will see the patient for admission.  He does have some slightly soft blood pressures now but this is from sedation and positive pressure ventilation.  I did speak to the sister who is a nurse at length about the patient's condition and likely poor prognosis.  At this time she would like to keep him a full code.   Patient's repeat blood gas has improved.  His lactate is also improved.  Again I think this is likely secondary to his respiratory status and not as much sepsis.    Date: 09/20/2017 3:21 AM  Rate: 128  Rhythm: sinus tachycardia  QRS Axis: normal  Intervals:  IVCD  ST/T Wave abnormalities: borderline anterior ST elevation  Conduction Disutrbances: IVCD  Narrative Interpretation: sinus tachycardia, IVCD, mild ST elevation in anterior leads but does not meet STEMI criteria, artifact       CRITICAL CARE Performed by: Pryor Curia   Total critical care time: 75 minutes  Critical care time was exclusive of separately billable procedures and treating other patients.  Critical care was necessary to treat or prevent imminent or life-threatening deterioration.  Critical care was time spent personally by me on the following activities: development of treatment plan with patient and/or surrogate as  well as nursing, discussions with consultants, evaluation of patient's response to treatment, examination of patient, obtaining history from patient or surrogate, ordering and performing treatments and interventions, ordering and review of laboratory studies, ordering and review of radiographic studies, pulse oximetry and re-evaluation of patient's condition.     INTUBATION Performed by: Pryor Curia  Required items: required blood products, implants, devices, and special equipment available Patient identity confirmed: provided demographic data and hospital-assigned identification number Time out: Immediately prior to procedure a "time out" was called to verify the correct patient, procedure, equipment, support staff and site/side marked as required.  Indications: Respiratory failure  Intubation method: Glidescope Laryngoscopy   Preoxygenation: BVM  Sedatives: 30 mg Etomidate Paralytic:  120 mg Succinylcholine  Tube Size: 6.5 cuffed  Post-procedure assessment: chest rise and ETCO2 monitor Breath sounds: equal and absent over the epigastrium Tube secured with: ETT holder Chest x-ray interpreted by radiologist and me.  Chest x-ray findings: endotracheal tube in appropriate position  Patient had a very difficult airway.  It was difficult to pass an endotracheal tube or even a bougie past his vocal cords.   Attempted a 7.5, 7.0. 6.5, 6.0, 5.5 tube.  Myself, Lorre Munroe, [place] and Dr. Jola Schmidt all attempted several times before ultimately a 6.5 endotracheal tube was passed over a bougie.      Dawsyn Zurn, Delice Bison, DO 09/20/17 [phone]

## 2017-09-20 NOTE — ED Notes (Signed)
Dr. Jenne Pane paged again to Dr. Elesa Massed @ [phone] to [phone].

## 2017-09-20 NOTE — ED Notes (Signed)
Chaplain paged @ [phone]. Sister is in the waiting room.

## 2017-09-20 NOTE — ED Notes (Signed)
Dr. Ezzard Standing w/ ENT paged to Dr. Elesa Massed @ [phone] to [phone].

## 2017-09-20 NOTE — Procedures (Signed)
Arterial Catheter Insertion Procedure Note ARNIS NAKANISHI [id number] Aug 22, 1956  Procedure: Insertion of Arterial Catheter  Indications: Blood pressure monitoring  Procedure Details Consent: Unable to obtain consent because of altered level of consciousness. Time Out: Verified patient identification, verified procedure, site/side was marked, verified correct patient position, special equipment/implants available, medications/allergies/relevent history reviewed, required imaging and test results available.  Performed  Maximum sterile technique was used including antiseptics, cap, gloves, gown, hand hygiene, mask and sheet. Skin prep: Chlorhexidine; local anesthetic administered 20 gauge catheter was inserted into right radial artery using the Seldinger technique.  Evaluation Blood flow good; BP tracing good. Complications: No apparent complications.   Ave Filter 09/20/2017

## 2017-09-20 NOTE — H&P (Signed)
PULMONARY / CRITICAL CARE MEDICINE   Name: Curtis Keller MRN: [id number] DOB: Jun 21, 1956    ADMISSION DATE:  09/20/2017 CONSULTATION DATE:  10/20/2017  REFERRING MD:  ED  CHIEF COMPLAINT:  Shortness of breath  HISTORY OF PRESENT ILLNESS:   61 yr old with multiple comorbidity including COPD, CHF EF 20%, CAD, apical mural thrombus resides in a nursing home was having altered mental status for 2 days which was thought to be due to UTI. He was altered as per his sister for few days and this morning  he started having severe shortness of breath saturating in the 50s with minimal improvemnet to the 70s on CPAP so a decision was made to intubate in the ED which was very difficult as per the ED physician. Please refer to their note.   CXR was showing bilateral infiltrates and is receiving cefepime and vancomycin.    PAST MEDICAL HISTORY :  He  has a past medical history of Acute respiratory failure (HCC), Anemia, Anxiety, Apical mural thrombus, CAD (coronary artery disease), Cerebral infarction (HCC), Chronic bronchitis (HCC), Chronic systolic CHF (congestive heart failure) (HCC), Dementia, Depression, Dysphagia, Falls, Hepatitis C, History of blood transfusion (12/2015; 04/07/2017), History of gout, Hyperlipidemia, Hypertension, Ischemic cardiomyopathy, Left hemiplegia (HCC), Myocardial infarction (HCC) (1999; ?2010), On home oxygen therapy, Pneumonia, Protein calorie malnutrition (HCC), Respiratory failure (HCC), Spleen injury, Status post tracheostomy (HCC), Stroke (HCC) (12/19/2015), and Type II diabetes mellitus (HCC).  PAST SURGICAL HISTORY: He  has a past surgical history that includes Tracheostomy tube placement (N/A, 02/22/2016); PR LAP, SPLENECTOMY (01/07/2016); ir generic historical (07/22/2016); Tracheostomy tube placement (N/A, 01/14/2017); IR GASTROSTOMY TUBE MOD SED (02/02/2017); Hip Arthroplasty (Left, 02/10/2017); Hernia repair; Abdominal hernia repair; Coronary angioplasty with stent (1999);  and Cardiac catheterization.  Allergies  Allergen Reactions  . Codeine Anaphylaxis and Nausea And Vomiting  . Lisinopril Itching and Rash  . Hydrocodone Other (See Comments)    On MAR  . Hydrocodone-Acetaminophen Nausea And Vomiting    Cannot take any acetaminophen due to liver issues per sister  . Tegaderm Ag Mesh [Silver] Other (See Comments)    Allergy to "silver compounds" listed on MAR  . Tylenol [Acetaminophen] Nausea Only and Other (See Comments)    Cannot take any acetaminophen due to liver issues per sister    No current facility-administered medications on file prior to encounter.    Current Outpatient Medications on File Prior to Encounter  Medication Sig  . acetylcysteine (MUCOMYST) 20 % nebulizer solution Take 4 mLs by nebulization 2 (two) times daily.  Marland Kitchen albuterol (PROVENTIL) (2.5 MG/3ML) 0.083% nebulizer solution Take 3 mLs (2.5 mg total) by nebulization every 4 (four) hours as needed for wheezing or shortness of breath.  . Amino Acids-Protein Hydrolys (FEEDING SUPPLEMENT, PRO-STAT SUGAR FREE 64,) LIQD Place 60 mLs into feeding tube 2 (two) times daily.   Marland Kitchen atorvastatin (LIPITOR) 40 MG tablet Place 40 mg into feeding tube at bedtime. for Lipids  . bisacodyl (DULCOLAX) 10 MG suppository Place 1 suppository (10 mg total) rectally daily as needed for moderate constipation.  . busPIRone (BUSPAR) 5 MG tablet Take 15 mg by mouth 3 (three) times daily.   . diazepam (VALIUM) 5 MG tablet Take 2.5 mg by mouth every morning.  . famotidine (PEPCID) 20 MG tablet Place 20 mg into feeding tube 2 (two) times daily. for GERD  . folic acid (FOLVITE) 1 MG tablet Place 1 mg into feeding tube daily.   Marland Kitchen guaiFENesin 200 MG tablet Place 200 mg  into feeding tube 3 (three) times daily. for CHF  . haloperidol decanoate (HALDOL DECANOATE) 100 MG/ML injection Inject 100 mg into the muscle once.  Marland Kitchen HYDROCORTISONE, TOPICAL, 1 % GEL Apply 1 application topically every 6 (six) hours as needed  (itching).  Marland Kitchen ipratropium-albuterol (DUONEB) 0.5-2.5 (3) MG/3ML SOLN Take 3 mLs by nebulization 4 (four) times daily.  Marland Kitchen lidocaine (LIDODERM) 5 % Place 2 patches onto the skin See admin instructions. Apply 2 patches to left knee every morning -remove & discard patch within 12 hours or as directed by MD  . metoprolol tartrate (LOPRESSOR) 25 MG tablet Place 25 mg into feeding tube 2 (two) times daily.  . Multiple Vitamin (MULTIVITAMIN WITH MINERALS) TABS tablet Place 1 tablet into feeding tube daily.  . Omega-3 Fatty Acids (FISH OIL) 1000 MG CAPS Place 1,000 mg into feeding tube daily.  . polyethylene glycol (MIRALAX / GLYCOLAX) packet Place 17 g into feeding tube daily. for constipation  . senna (SENOKOT) 8.6 MG TABS tablet Place 1 tablet (8.6 mg total) into feeding tube daily as needed for mild constipation. For constipation  . tamsulosin (FLOMAX) 0.4 MG CAPS capsule 0.4 mg daily at 6 PM. Give 1 capsule (0.4 mg) via PEG-Tube one time daily  . tizanidine (ZANAFLEX) 6 MG capsule Take 18 mg by mouth 3 (three) times daily.  . vitamin B-12 1000 MCG tablet Place 1 tablet (1,000 mcg total) into feeding tube daily.  Marland Kitchen warfarin (COUMADIN) 5 MG tablet Take 5 mg by mouth daily.  . Water For Irrigation, Sterile (FREE WATER) SOLN Place 150 mLs into feeding tube every 4 (four) hours.  . Nutritional Supplements (FEEDING SUPPLEMENT, OSMOLITE 1.5 CAL,) LIQD Place 1,000 mLs into feeding tube See admin instructions. Run at 80 cc/hr 8 hrs via peg tube. Start at 1600 and stop  @@ 10 am two times a day related to tracheostomy status  . warfarin (COUMADIN) 4 MG tablet Place 2 tablets (8 mg total) into feeding tube one time only at 6 PM.    FAMILY HISTORY:  His indicated that the status of his mother is unknown. He indicated that the status of his father is unknown. He indicated that the status of his maternal uncle is unknown.   SOCIAL HISTORY: He  reports that he quit smoking about 21 months ago. His smoking use  included cigarettes. He has a 76.00 pack-year smoking history. he has never used smokeless tobacco. He reports that he does not drink alcohol or use drugs.  REVIEW OF SYSTEMS:   Could not be obtained due to sedation   VITAL SIGNS: BP 95/75   Pulse 82   Temp 98.2 F (36.8 C) (Rectal)   Resp (!) 21   Ht 6' (1.829 m)   Wt 98 kg (216 lb)   SpO2 91%   BMI 29.29 kg/m   HEMODYNAMICS:  no pressors stable   VENTILATOR SETTINGS: Vent Mode: PRVC FiO2 (%):  [85 %-100 %] 100 % Set Rate:  [16 bmp] 16 bmp Vt Set:  [600 mL] 600 mL PEEP:  [10 cmH20] 10 cmH20 Plateau Pressure:  [31 cmH20] 31 cmH20  INTAKE / OUTPUT: I/O last 3 completed shifts: In: 500 [I.V.:250; IV Piggyback:250] Out: -   PHYSICAL EXAMINATION: General:  Sedated intubated  Neuro:  Sedated not following commands  HEENT:  Intubated  Cardiovascular:  Normal heart sounds no murmurs  Lungs:  Bilateral coarse crackles no wheezing  Abdomen:  Soft no tenderness  Musculoskeletal: mild edema  Skin:  No rash  LABS:  BMET Recent Labs  Lab 09/20/17 0321  NA 143  K 5.3*  CL 111  CO2 19*  BUN 48*  CREATININE 2.66*  GLUCOSE 166*    Electrolytes Recent Labs  Lab 09/20/17 0321  CALCIUM 9.4    CBC Recent Labs  Lab 09/20/17 0545  WBC 19.0*  HGB 12.1*  HCT 36.7*  PLT 236    Coag's Recent Labs  Lab 09/20/17 0321  INR 1.77    Sepsis Markers Recent Labs  Lab 09/20/17 0352 09/20/17 0604  LATICACIDVEN 6.66* 4.63*    ABG Recent Labs  Lab 09/20/17 0324 09/20/17 0619  PHART 7.272* 7.375  PCO2ART 37.8 34.9  PO2ART 68.0* 93.0    Liver Enzymes No results for input(s): AST, ALT, ALKPHOS, BILITOT, ALBUMIN in the last 168 hours.  Cardiac Enzymes No results for input(s): TROPONINI, PROBNP in the last 168 hours.  Glucose No results for input(s): GLUCAP in the last 168 hours.  Imaging Dg Chest Portable 1 View  Result Date: 09/20/2017 CLINICAL DATA:  Intubation. EXAM: PORTABLE CHEST 1 VIEW  COMPARISON:  New earlier this day at 0316 hour FINDINGS: Endotracheal tube 4.3 cm from the carina. Patient is rotated to the right. Cardiomegaly is similar. Progression of bilateral perihilar opacities since prior exam. No pneumothorax or definite pleural fluid. IMPRESSION: 1. Endotracheal tube 4.3 cm from the carina. 2. Progression of diffuse bilateral lung opacities over the past 2 hours, favoring pulmonary edema. Electronically Signed   By: Rubye OaksMelanie  Ehinger M.D.   On: 09/20/2017 05:44   Dg Chest Portable 1 View  Result Date: 09/20/2017 CLINICAL DATA:  Respiratory distress. Chest discomfort and shortness of breath. EXAM: PORTABLE CHEST 1 VIEW COMPARISON:  Most recent comparison 05/06/2017 FINDINGS: Cardiomegaly, similar to increased from prior. Diffuse patchy and confluent opacities, most prominent in the perihilar and suprahilar regions. Background peribronchial cuffing. No large pleural effusion or pneumothorax. IMPRESSION: Diffuse bilateral perihilar opacities and peribronchial thickening. Considerations include pulmonary edema versus multifocal pneumonia. Cardiomegaly is similar to mildly progressed from prior. Electronically Signed   By: Rubye OaksMelanie  Ehinger M.D.   On: 09/20/2017 03:32   Dg Abd Portable 1v  Result Date: 09/20/2017 CLINICAL DATA:  Orogastric tube placement. EXAM: PORTABLE ABDOMEN - 1 VIEW COMPARISON:  None. FINDINGS: Enteric tube in place with tip and side-port below the diaphragm. Gastrostomy tube in the left upper quadrant. Increased air within small bowel in the central abdomen. No evidence of free air. IMPRESSION: Tip and side port of the enteric tube below the diaphragm in the stomach. Electronically Signed   By: Rubye OaksMelanie  Ehinger M.D.   On: 09/20/2017 05:45       ANTIBIOTICS: Zosyn vanc  ASSESSMENT / PLAN:  Acute hypoxemic respiratory failure requiring mechanical ventilation Acute systolic CHF EF 20% Pulmonary edema HCAP Acute on chronic kidney  disease Hyperkalemia Lactic acidosis Metabolic encephalopathy HX of LV thrombus    Plan: Sedation to be weaned down (soft BP) IV lasix when BP allows - consider levophed if remains hypotensive - panculture - DVT and GI prophylaxis - hold anti HTN meds  - might benefit from cardiology in regards of anticoagulation (will hold off for now until his CT head) - stat CT head since his AMS and being on anticoagulation   I have spent 45 mins of CC time bedside or in the unit exclusive of any billable procedures patient is needing ICU due to resp failure requiring mechanical ventilation     FAMILY  - Updates: sister bedside is a  nurse and updated   - Inter-disciplinary family meet or Palliative Care meeting due by:  09/28/2017    Pulmonary and Critical Care Medicine [facility] Pager: [phone]  09/20/2017, 7:28 AM

## 2017-09-20 NOTE — Progress Notes (Signed)
PHARMACY - PHYSICIAN COMMUNICATION CRITICAL VALUE ALERT - BLOOD CULTURE IDENTIFICATION (BCID)  Results for orders placed or performed during the hospital encounter of 09/20/17  Blood Culture ID Panel (Reflexed) (Collected: 09/20/2017  3:40 AM)  Result Value Ref Range   Enterococcus species NOT DETECTED NOT DETECTED   Listeria monocytogenes NOT DETECTED NOT DETECTED   Staphylococcus species DETECTED (A) NOT DETECTED   Staphylococcus aureus DETECTED (A) NOT DETECTED   Methicillin resistance DETECTED (A) NOT DETECTED   Streptococcus species NOT DETECTED NOT DETECTED   Streptococcus agalactiae NOT DETECTED NOT DETECTED   Streptococcus pneumoniae NOT DETECTED NOT DETECTED   Streptococcus pyogenes NOT DETECTED NOT DETECTED   Acinetobacter baumannii NOT DETECTED NOT DETECTED   Enterobacteriaceae species NOT DETECTED NOT DETECTED   Enterobacter cloacae complex NOT DETECTED NOT DETECTED   Escherichia coli NOT DETECTED NOT DETECTED   Klebsiella oxytoca NOT DETECTED NOT DETECTED   Klebsiella pneumoniae NOT DETECTED NOT DETECTED   Proteus species NOT DETECTED NOT DETECTED   Serratia marcescens NOT DETECTED NOT DETECTED   Haemophilus influenzae NOT DETECTED NOT DETECTED   Neisseria meningitidis NOT DETECTED NOT DETECTED   Pseudomonas aeruginosa NOT DETECTED NOT DETECTED   Candida albicans NOT DETECTED NOT DETECTED   Candida glabrata NOT DETECTED NOT DETECTED   Candida krusei NOT DETECTED NOT DETECTED   Candida parapsilosis NOT DETECTED NOT DETECTED   Candida tropicalis NOT DETECTED NOT DETECTED   On zosyn and vancomycin for PNA.  Blood Culture results: GPC/clusters (reported in 1/3 bottles)  Name of physician (or Provider) Contacted: Dr Deterding  Changes to prescribed antibiotics required: No changes  Harland German, Pharm D 09/20/2017 9:30 PM

## 2017-09-20 NOTE — ED Notes (Signed)
Notified Yasemia RN @ [phone] that I contacted [facility] @ [phone] to page out a Code Sepsis.

## 2017-09-20 NOTE — Code Documentation (Signed)
ET tube placed by Dr. Patria Mane.

## 2017-09-20 NOTE — ED Notes (Signed)
Dr. Jenne Pane w/ ENT paged to [facility] RN @ [phone] to [phone].

## 2017-09-20 NOTE — ED Notes (Signed)
[name] w/ Anesthesia paged to [facility] RN @ [phone] to [phone].

## 2017-09-20 NOTE — ED Notes (Signed)
Dr Elesa Massed given a copy of troponin results 2.52

## 2017-09-20 NOTE — Progress Notes (Signed)
PULMONARY / CRITICAL CARE MEDICINE   Name: Curtis Keller MRN: [id number] DOB: 1956-03-28    ADMISSION DATE:  09/20/2017 CONSULTATION DATE:  10/20/2017  REFERRING MD:  ED  CHIEF COMPLAINT:  Shortness of breath  HISTORY OF PRESENT ILLNESS:   61 yr old with multiple comorbidity including COPD, CHF EF 20%, CAD, apical mural thrombus resides in a nursing home was having altered mental status for 2 days which was thought to be due to UTI. He was altered as per his sister for few days and this morning  he started having severe shortness of breath saturating in the 50s with minimal improvemnet to the 70s on CPAP so a decision was made to intubate in the ED which was very difficult as per the ED physician. Please refer to their note.   CXR was showing bilateral infiltrates and is receiving cefepime and vancomycin.    VITAL SIGNS: BP 106/74   Pulse (!) 44   Temp (!) 97.3 F (36.3 C) (Axillary) Comment: Phylis RN notified   Resp (!) 27   Ht 6' (1.829 m)   Wt 220 lb 0.3 oz (99.8 kg)   SpO2 100%   BMI 29.84 kg/m   HEMODYNAMICS:  no pressors stable   VENTILATOR SETTINGS: Vent Mode: PRVC FiO2 (%):  [85 %-100 %] 90 % Set Rate:  [16 bmp-24 bmp] 24 bmp Vt Set:  [470 mL-620 mL] 470 mL PEEP:  [10 cmH20] 10 cmH20 Plateau Pressure:  [25 cmH20-31 cmH20] 26 cmH20  INTAKE / OUTPUT: I/O last 3 completed shifts: In: 500 [I.V.:250; IV Piggyback:250] Out: -   PHYSICAL EXAMINATION: General: 61 year old male currently heavily sedated on mechanical ventilator HEENT: Orally intubated normocephalic atraumatic Pulmonary: Scattered rhonchi accessory muscle use on ventilator Cardiac: Irregular regular Abdomen: Soft nontender no organomegaly Extremities: Cool, slightly mottled, weak pulses. Neuro: Heavily sedated has chronic left-sided hemiplegia LABS:  BMET Recent Labs  Lab 09/20/17 0321  NA 143  K 5.3*  CL 111  CO2 19*  BUN 48*  CREATININE 2.66*  GLUCOSE 166*    Electrolytes Recent  Labs  Lab 09/20/17 0321  CALCIUM 9.4    CBC Recent Labs  Lab 09/20/17 0545  WBC 19.0*  HGB 12.1*  HCT 36.7*  PLT 236    Coag's Recent Labs  Lab 09/20/17 0321  INR 1.77    Sepsis Markers Recent Labs  Lab 09/20/17 0352 09/20/17 0604  LATICACIDVEN 6.66* 4.63*    ABG Recent Labs  Lab 09/20/17 0324 09/20/17 0619 09/20/17 1238  PHART 7.272* 7.375 7.364  PCO2ART 37.8 34.9 37.4  PO2ART 68.0* 93.0 274*    Liver Enzymes No results for input(s): AST, ALT, ALKPHOS, BILITOT, ALBUMIN in the last 168 hours.  Cardiac Enzymes Recent Labs  Lab 09/20/17 0559  TROPONINI 3.77*    Glucose Recent Labs  Lab 09/20/17 0906 09/20/17 1106  GLUCAP 154* 138*    Imaging Ct Head Wo Contrast  Result Date: 09/20/2017 CLINICAL DATA:  Altered level of consciousness EXAM: CT HEAD WITHOUT CONTRAST TECHNIQUE: Contiguous axial images were obtained from the base of the skull through the vertex without intravenous contrast. COMPARISON:  12/16/2016 FINDINGS: Brain: Bilateral basal ganglia lacunar infarcts. Old infarct in the right brainstem. There is atrophy and chronic small vessel disease changes. No acute intracranial abnormality. Specifically, no hemorrhage, hydrocephalus, mass lesion, acute infarction, or significant intracranial injury. Vascular: No hyperdense vessel or unexpected calcification. Skull: No acute calvarial abnormality. Sinuses/Orbits: No acute finding. Other: None IMPRESSION: Old bilateral basal ganglia lacunar infarcts  and infarct in the right brainstem. No acute intracranial abnormality. Atrophy, chronic small vessel disease. Electronically Signed   By: Charlett NoseKevin  Dover M.D.   On: 09/20/2017 09:00   Dg Chest Portable 1 View  Result Date: 09/20/2017 CLINICAL DATA:  Intubation. EXAM: PORTABLE CHEST 1 VIEW COMPARISON:  New earlier this day at 0316 hour FINDINGS: Endotracheal tube 4.3 cm from the carina. Patient is rotated to the right. Cardiomegaly is similar. Progression of  bilateral perihilar opacities since prior exam. No pneumothorax or definite pleural fluid. IMPRESSION: 1. Endotracheal tube 4.3 cm from the carina. 2. Progression of diffuse bilateral lung opacities over the past 2 hours, favoring pulmonary edema. Electronically Signed   By: Rubye OaksMelanie  Ehinger M.D.   On: 09/20/2017 05:44   Dg Chest Portable 1 View  Result Date: 09/20/2017 CLINICAL DATA:  Respiratory distress. Chest discomfort and shortness of breath. EXAM: PORTABLE CHEST 1 VIEW COMPARISON:  Most recent comparison 05/06/2017 FINDINGS: Cardiomegaly, similar to increased from prior. Diffuse patchy and confluent opacities, most prominent in the perihilar and suprahilar regions. Background peribronchial cuffing. No large pleural effusion or pneumothorax. IMPRESSION: Diffuse bilateral perihilar opacities and peribronchial thickening. Considerations include pulmonary edema versus multifocal pneumonia. Cardiomegaly is similar to mildly progressed from prior. Electronically Signed   By: Rubye OaksMelanie  Ehinger M.D.   On: 09/20/2017 03:32   Dg Abd Portable 1v  Result Date: 09/20/2017 CLINICAL DATA:  Orogastric tube placement. EXAM: PORTABLE ABDOMEN - 1 VIEW COMPARISON:  None. FINDINGS: Enteric tube in place with tip and side-port below the diaphragm. Gastrostomy tube in the left upper quadrant. Increased air within small bowel in the central abdomen. No evidence of free air. IMPRESSION: Tip and side port of the enteric tube below the diaphragm in the stomach. Electronically Signed   By: Rubye OaksMelanie  Ehinger M.D.   On: 09/20/2017 05:45   Culture data Sputum culture: 11/28>>> Blood culture 11/28>>>  ANTIBIOTICS: Zosyn 11/28>>> Vanc 11/28>>>  ASSESSMENT / PLAN:  Acute hypoxemic respiratory failure in the setting of diffuse pulmonary infiltrates .  Favor healthcare associated pneumonia with component of acute lung injury however cannot exclude element of pulmonary edema given known depressed ejection fraction Chest  x-ray personally reviewed: This demonstrates that the endotracheal tube is in satisfactory position and also increase in diffuse bilateral airspace disease. He had more than 15% stroke volume increase associated with volume challenge suggesting ongoing volume depletion.  This leads me to think infiltrates more likely to be acute lung injury Plan  continue full ventilator support Transition to ARDS protocol PAD protocol RASS goal -2 Repeat ABG following ventilator change and as needed Holding off on diuretics for now giving stroke volume response to fluid challenge and recent hypotension on arrival to the ICU  SIRS sepsis-septic shock-->volume responsive  Systolic HFrEF (20%) Elevated troponin, suspect demand ischemia he is not a candidate for intervention Plan MAP goal > 65 Cont MIVFs Cont tele  Trend CEs  History of left ventricular thrombus, on chronic warfarin therapy INR 1.77 Plan Initiate heparin drip while critically ill.  Acute on chronic kidney disease; it appears as though his baseline creatinine ranges from 1.7 to around 2.4; presents with creatinine of 2.66 on admission Plan Now status post aggressive fluid resuscitation, will continue maintenance IV fluids Mean arterial pressure goal greater than 65 Renal dose medications Hourly intake and output Repeat a.m. chemistry  Lactic acidosis in the setting of severe sepsis, septic shock. Initial lactic acid 4.63.  It was unclear if patient's pulmonary infiltrates were pulmonary edema  versus pneumonia/acute lung injury.  We had initially been reluctant to aggressively fluid resuscitate.  Invasive hemodynamic monitoring revealed significant stroke volume improvement following fluid challenge, we have now aggressively volume resuscitated and there is no further significant stroke volume change with fluid challenges noted Plan Repeat lactic acid  Hyperkalemia, likely exacerbated by acidosis and renal dysfunction Plan Repeat  potassium level this afternoon  Metabolic encephalopathy; with history of stroke and left-sided hemiplegia since June 2018 CT head was completed there was no acute abnormality Plan PAD protocol RASS goal -2  History of dysphasia; has chronic PEG, Plan N.p.o.  History of diabetes Plan Sliding scale insulin  Chronic anemia without evidence of bleeding Plan Trend CBC  History of hep C Plan Universal precautions   FAMILY  - Updates: sister bedside is a nurse and updated   My cct 50 minutes.   Simonne Martinet ACNP-BC [facility] Pulmonary/Critical Care Pager # [phone] OR # [phone] if no answer  09/20/2017, 1:22 PM   Attending note: I have seen and examined the patient with nurse practitioner/resident and agree with the note. History, labs and imaging reviewed.  61 year old with COPD, CHF, coronary artery disease, apical thrombus admitted with altered mental status, acute respiratory failure with bilateral pulmonary infiltrates Suspect HCAP with ARDS  On arrival to the ICU patient was hypotensive but appeared to be responding to fluids. Continue IV fluid resuscitation. May need pressors. Hold diruesis ARDS protocol ventialtion Continue abx coverage  The patient is critically ill with multiple organ systems failure and requires high complexity decision making for assessment and support, frequent evaluation and titration of therapies, application of advanced monitoring technologies and extensive interpretation of multiple databases.  Critical care time - 35 mins. This represents my time independent of the NPs time taking care of the pt.  Chilton Greathouse MD [place] [facility] and [facility] Pager [phone] If no answer or after 3pm call: [phone] 09/20/2017, 4:07 PM

## 2017-09-20 NOTE — ED Notes (Signed)
Per ICU doctor hold on Lasix until they start pt on pressors, pt BP drops on the Propofol to 70/50 200 NS bolus given to the pt and fluids at 120 cc/hr continues.

## 2017-09-20 NOTE — Progress Notes (Signed)
   09/20/17 0600  Clinical Encounter Type  Visited With Family  Visit Type Initial;Spiritual support  Referral From Nurse  Consult/Referral To Chaplain  Spiritual Encounters  Spiritual Needs Emotional  Stress Factors  Family Stress Factors Health changes  Chaplain was called to meet with grieving family members of PT.  Sister of PT was in ED, presented with info about brother from doctor.  Chaplain consulted family member, Prayer not requested, Discussion on caregiving as sister of PT is an [employer] fundraiser.

## 2017-09-20 NOTE — Progress Notes (Signed)
ANTICOAGULATION CONSULT NOTE - Initial Consult  Pharmacy Consult for heparin Indication: history of atrial thrombus  Allergies  Allergen Reactions  . Codeine Anaphylaxis and Nausea And Vomiting  . Lisinopril Itching and Rash  . Hydrocodone Other (See Comments)    On MAR  . Hydrocodone-Acetaminophen Nausea And Vomiting    Cannot take any acetaminophen due to liver issues per sister  . Tegaderm Ag Mesh [Silver] Other (See Comments)    Allergy to "silver compounds" listed on MAR  . Tylenol [Acetaminophen] Nausea Only and Other (See Comments)    Cannot take any acetaminophen due to liver issues per sister    Patient Measurements: Height: 6' (182.9 cm) Weight: 220 lb 0.3 oz (99.8 kg) IBW/kg (Calculated) : 77.6 Heparin Dosing Weight: 97 kg  Vital Signs: Temp: 97.3 F (36.3 C) (11/28 1108) Temp Source: Axillary (11/28 1108) BP: 106/74 (11/28 1300) Pulse Rate: 90 (11/28 1340)  Labs: Recent Labs    09/20/17 0321 09/20/17 0545 09/20/17 0559  HGB  --  12.1*  --   HCT  --  36.7*  --   PLT  --  236  --   LABPROT 20.4*  --   --   INR 1.77  --   --   CREATININE 2.66*  --   --   TROPONINI  --   --  3.77*     Medical History: Past Medical History:  Diagnosis Date  . Acute respiratory failure (HCC)    a. Spring 2017 following fall/splenectomy -->admitted to [facility]-->trach/g tube.  . Anemia    a. 12/2015 ABL in setting of fall/hematomas/splenic laceration req splenectomy.  Marland Kitchen. Anxiety   . Apical mural thrombus    a. 11/2015 Echo: EF 25-30%, moderate size apical thrombus-->coumadin;  b. 12/2015 f/u Echo: EF 25-30%, ant/septa HK, no obvious large thrombus but cannot exclude small mural thrombus.  Marland Kitchen. CAD (coronary artery disease)    a. 12/2014 s/p BMS to the RCA.  Marland Kitchen. Cerebral infarction (HCC)   . Chronic bronchitis (HCC)    "probably"/sister 04/07/2017  . Chronic systolic CHF (congestive heart failure) (HCC)    a. 12/2015 Echo: EF 25-30%.  . Dementia   . Depression    . Dysphagia   . Falls    a. 11/2015 traumatic fall with resultant trauma req splenectomy and prolonged hospitalization complicated by resp failure.  . Hepatitis C    "never treated" (04/07/2017)  . History of blood transfusion 12/2015; 04/07/2017   "related to spleen OR; related to low HgB"   . History of gout   . Hyperlipidemia   . Hypertension   . Ischemic cardiomyopathy    a. 12/2015 Echo: EF 25-30%, anterior and septal HK.  Marland Kitchen. Left hemiplegia (HCC)   . Myocardial infarction (HCC) 1999; ?2010  . On home oxygen therapy    "28% at the SNF" (6/15/2018Z)  . Pneumonia    "several times since 12/19/2015" (6/15/20180)  . Protein calorie malnutrition (HCC)   . Respiratory failure (HCC)   . Spleen injury   . Status post tracheostomy (HCC)    a. 01/2016 in setting of ongoing resp failure and aspiration.  . Stroke (HCC) 12/19/2015   "left side paralyzed since" (04/07/2017)  . Type II diabetes mellitus (HCC)     Assessment: 61 yo male admitted with SOB and AMS. Pt is on warfarin prior to admission due to a history of apical thrombus and CVA. INR on admission is 1.7, will transition patient to heparin while undergoing workup.  Goal of Therapy:  Heparin level 0.3-0.7 units/ml Monitor platelets by anticoagulation protocol: Yes    Plan:  -Heparin bolus 4000 units x1 then 1350 units/hr -Daily HL, CBC -Level this evening     [name] 09/20/2017,1:56 PM

## 2017-09-21 ENCOUNTER — Inpatient Hospital Stay (HOSPITAL_COMMUNITY): Payer: Medicare Other

## 2017-09-21 DIAGNOSIS — Z8619 Personal history of other infectious and parasitic diseases: Secondary | ICD-10-CM

## 2017-09-21 DIAGNOSIS — I248 Other forms of acute ischemic heart disease: Secondary | ICD-10-CM

## 2017-09-21 DIAGNOSIS — I2510 Atherosclerotic heart disease of native coronary artery without angina pectoris: Secondary | ICD-10-CM

## 2017-09-21 DIAGNOSIS — B9562 Methicillin resistant Staphylococcus aureus infection as the cause of diseases classified elsewhere: Secondary | ICD-10-CM

## 2017-09-21 DIAGNOSIS — I255 Ischemic cardiomyopathy: Secondary | ICD-10-CM

## 2017-09-21 DIAGNOSIS — I69354 Hemiplegia and hemiparesis following cerebral infarction affecting left non-dominant side: Secondary | ICD-10-CM

## 2017-09-21 DIAGNOSIS — R7881 Bacteremia: Secondary | ICD-10-CM

## 2017-09-21 DIAGNOSIS — E1122 Type 2 diabetes mellitus with diabetic chronic kidney disease: Secondary | ICD-10-CM

## 2017-09-21 DIAGNOSIS — I252 Old myocardial infarction: Secondary | ICD-10-CM

## 2017-09-21 DIAGNOSIS — F0390 Unspecified dementia without behavioral disturbance: Secondary | ICD-10-CM

## 2017-09-21 DIAGNOSIS — B1920 Unspecified viral hepatitis C without hepatic coma: Secondary | ICD-10-CM

## 2017-09-21 DIAGNOSIS — N183 Chronic kidney disease, stage 3 (moderate): Secondary | ICD-10-CM

## 2017-09-21 DIAGNOSIS — Z87891 Personal history of nicotine dependence: Secondary | ICD-10-CM

## 2017-09-21 LAB — BLOOD GAS, ARTERIAL
ACID-BASE DEFICIT: 1.4 mmol/L (ref 0.0–2.0)
Bicarbonate: 22.7 mmol/L (ref 20.0–28.0)
DRAWN BY: 414221
FIO2: 40
MECHVT: 470 mL
O2 Saturation: 97 %
PEEP/CPAP: 8 cmH2O
Patient temperature: 98.6
RATE: 24 resp/min
pCO2 arterial: 37.9 mmHg (ref 32.0–48.0)
pH, Arterial: 7.395 (ref 7.350–7.450)
pO2, Arterial: 92.2 mmHg (ref 83.0–108.0)

## 2017-09-21 LAB — COMPREHENSIVE METABOLIC PANEL
ALBUMIN: 2.2 g/dL — AB (ref 3.5–5.0)
ALT: 19 U/L (ref 17–63)
ANION GAP: 7 (ref 5–15)
AST: 55 U/L — ABNORMAL HIGH (ref 15–41)
Alkaline Phosphatase: 67 U/L (ref 38–126)
BILIRUBIN TOTAL: 0.9 mg/dL (ref 0.3–1.2)
BUN: 58 mg/dL — ABNORMAL HIGH (ref 6–20)
CALCIUM: 8.6 mg/dL — AB (ref 8.9–10.3)
CO2: 22 mmol/L (ref 22–32)
Chloride: 115 mmol/L — ABNORMAL HIGH (ref 101–111)
Creatinine, Ser: 2.51 mg/dL — ABNORMAL HIGH (ref 0.61–1.24)
GFR calc non Af Amer: 26 mL/min — ABNORMAL LOW (ref >60)
GFR, EST AFRICAN AMERICAN: 30 mL/min — AB (ref >60)
GLUCOSE: 128 mg/dL — AB (ref 65–99)
POTASSIUM: 4 mmol/L (ref 3.5–5.1)
Sodium: 144 mmol/L (ref 135–145)
TOTAL PROTEIN: 6.2 g/dL — AB (ref 6.5–8.1)

## 2017-09-21 LAB — LACTIC ACID, PLASMA: LACTIC ACID, VENOUS: 1.1 mmol/L (ref 0.5–1.9)

## 2017-09-21 LAB — URINALYSIS, ROUTINE W REFLEX MICROSCOPIC
BILIRUBIN URINE: NEGATIVE
Glucose, UA: NEGATIVE mg/dL
Ketones, ur: NEGATIVE mg/dL
LEUKOCYTES UA: NEGATIVE
NITRITE: NEGATIVE
PROTEIN: 100 mg/dL — AB
SQUAMOUS EPITHELIAL / LPF: NONE SEEN
Specific Gravity, Urine: 1.024 (ref 1.005–1.030)
pH: 5 (ref 5.0–8.0)

## 2017-09-21 LAB — PHOSPHORUS
PHOSPHORUS: 3 mg/dL (ref 2.5–4.6)
Phosphorus: 3.4 mg/dL (ref 2.5–4.6)

## 2017-09-21 LAB — MAGNESIUM
MAGNESIUM: 1.8 mg/dL (ref 1.7–2.4)
Magnesium: 2 mg/dL (ref 1.7–2.4)

## 2017-09-21 LAB — HEPARIN LEVEL (UNFRACTIONATED)
HEPARIN UNFRACTIONATED: 0.25 [IU]/mL — AB (ref 0.30–0.70)
Heparin Unfractionated: 0.21 IU/mL — ABNORMAL LOW (ref 0.30–0.70)

## 2017-09-21 LAB — GLUCOSE, CAPILLARY
GLUCOSE-CAPILLARY: 104 mg/dL — AB (ref 65–99)
GLUCOSE-CAPILLARY: 141 mg/dL — AB (ref 65–99)
Glucose-Capillary: 134 mg/dL — ABNORMAL HIGH (ref 65–99)
Glucose-Capillary: 131 mg/dL — ABNORMAL HIGH (ref 65–99)

## 2017-09-21 LAB — PROTIME-INR
INR: 2.13
PROTHROMBIN TIME: 23.6 s — AB (ref 11.4–15.2)

## 2017-09-21 LAB — PROCALCITONIN: PROCALCITONIN: 4.14 ng/mL

## 2017-09-21 LAB — CBC
HEMATOCRIT: 31.8 % — AB (ref 39.0–52.0)
Hemoglobin: 10 g/dL — ABNORMAL LOW (ref 13.0–17.0)
MCH: 31.2 pg (ref 26.0–34.0)
MCHC: 31.4 g/dL (ref 30.0–36.0)
MCV: 99.1 fL (ref 78.0–100.0)
Platelets: 170 10*3/uL (ref 150–400)
RBC: 3.21 MIL/uL — ABNORMAL LOW (ref 4.22–5.81)
RDW: 14.6 % (ref 11.5–15.5)
WBC: 16.6 10*3/uL — ABNORMAL HIGH (ref 4.0–10.5)

## 2017-09-21 LAB — TROPONIN I: Troponin I: 8.7 ng/mL (ref <0.03)

## 2017-09-21 MED ORDER — VITAL AF 1.2 CAL PO LIQD
1000.0000 mL | ORAL | Status: DC
  Administered 2017-09-21 – 2017-09-26 (×5): 1000 mL
  Filled 2017-09-21 (×2): qty 1000

## 2017-09-21 MED ORDER — HEPARIN BOLUS VIA INFUSION
3000.0000 [IU] | Freq: Once | INTRAVENOUS | Status: AC
  Administered 2017-09-21: 3000 [IU] via INTRAVENOUS
  Filled 2017-09-21: qty 3000

## 2017-09-21 MED ORDER — HEPARIN BOLUS VIA INFUSION
1500.0000 [IU] | Freq: Once | INTRAVENOUS | Status: AC
  Administered 2017-09-21: 1500 [IU] via INTRAVENOUS
  Filled 2017-09-21: qty 1500

## 2017-09-21 NOTE — Progress Notes (Signed)
ANTICOAGULATION CONSULT NOTE Pharmacy Consult for heparin Indication: history of atrial thrombus  Allergies  Allergen Reactions  . Codeine Anaphylaxis and Nausea And Vomiting  . Lisinopril Itching and Rash  . Hydrocodone Other (See Comments)    On MAR  . Hydrocodone-Acetaminophen Nausea And Vomiting    Cannot take any acetaminophen due to liver issues per sister  . Tegaderm Ag Mesh [Silver] Other (See Comments)    Allergy to "silver compounds" listed on MAR  . Tylenol [Acetaminophen] Nausea Only and Other (See Comments)    Cannot take any acetaminophen due to liver issues per sister    Patient Measurements: Height: 6' (182.9 cm) Weight: 218 lb 11.1 oz (99.2 kg) IBW/kg (Calculated) : 77.6 Heparin Dosing Weight: 97 kg  Vital Signs: Temp: 99.4 F (37.4 C) (11/29 2052) Temp Source: Oral (11/29 2052) BP: 116/90 (11/29 1916) Pulse Rate: 93 (11/29 1900)  Labs: Recent Labs    09/20/17 0321 09/20/17 0545 09/20/17 0559  09/21/17 0431 09/21/17 1257 09/21/17 2127  HGB  --  12.1*  --   --  10.0*  --   --   HCT  --  36.7*  --   --  31.8*  --   --   PLT  --  236  --   --  170  --   --   LABPROT 20.4*  --   --   --  23.6*  --   --   INR 1.77  --   --   --  2.13  --   --   HEPARINUNFRC  --   --   --    < > 0.25* <0.10* 0.21*  CREATININE 2.66*  --   --   --  2.51*  --   --   TROPONINI  --   --  3.77*  --   --  8.70*  --    < > = values in this interval not displayed.   Assessment: 61 yo male admitted with SOB and AMS. Pt is on warfarin prior to admission due to a history of apical thrombus and CVA. Now on heparin while undergoing workup.   Hep lvl this evening low at 0.21 on 2100 units/hr  Goal of Therapy:  Heparin level 0.3-0.7 units/ml Monitor platelets by anticoagulation protocol: Yes    Plan:   Bolus heparin 1500 unitsx1 Increase heparin to 2250 units/hr Daily heparin level, CBC Monitor clinical course, s/sx bleeding  Isaac Bliss, PharmD, BCPS, BCCCP Clinical  Pharmacist Clinical phone for 09/21/2017 from 7a-3:30p: [phone] If after 3:30p, please call main pharmacy at: [phone] 09/21/2017 10:09 PM

## 2017-09-21 NOTE — Consult Note (Signed)
[facility] for Infectious Disease  Total days of antibiotics 3        Day 2 piptazo        Day 3 vanco               Reason for Consult: MRSA bacteremia   Referring Physician: Vaughan Browner  Active Problems:   Acute systolic (congestive) heart failure (HCC)   Acute respiratory failure with hypoxia (HCC)   Acute hypoxemic respiratory failure (HCC)    HPI: Curtis Keller is a 61 y.o. male with extensive medical hx of CAD c/b ICM EF 25-30%, hx of larg mural thrombus in 2017, hx of CVA with left sided weakness, prolonged hospitalization in 2017 2/2 fall c/b splenic lac, s/p splenectomy, hx of prolonged vent wean s/p trach and peg s/p decannulation.   Admitted on 11/27 for AMS x 2 days and worsening shortness of breath with hypoxia of o2sat of 50%, required intubation in the ED. Labs showed leukocytosis of 19K with 23% bandemia, LA 6.66. Infectious work up shows positive blood cx MRSA but blood cx also showing GPR. Chest CT showing bilateral infiltrates per my review. Clinical picture also complicated by demand ischemia  Patient remains intubated, off of sedation   Past Medical History:  Diagnosis Date  . Acute respiratory failure ([place])    a. Spring 2017 following fall/splenectomy -->admitted to [facility]-->trach/g tube.  . Anemia    a. 12/2015 ABL in setting of fall/hematomas/splenic laceration req splenectomy.  Marland Kitchen Anxiety   . Apical mural thrombus    a. 11/2015 Echo: EF 25-30%, moderate size apical thrombus-->coumadin;  b. 12/2015 f/u Echo: EF 25-30%, ant/septa HK, no obvious large thrombus but cannot exclude small mural thrombus.  Marland Kitchen CAD (coronary artery disease)    a. 12/2014 s/p BMS to the RCA.  Marland Kitchen Cerebral infarction ([place])   . Chronic bronchitis ([place])    "probably"/sister 04/07/2017  . Chronic systolic CHF (congestive heart failure) ([place])    a. 12/2015 Echo: EF 25-30%.  . Dementia   . Depression   . Dysphagia   . Falls    a. 11/2015 traumatic fall with resultant trauma  req splenectomy and prolonged hospitalization complicated by resp failure.  . Hepatitis C    "never treated" (04/07/2017)  . History of blood transfusion 12/2015; 04/07/2017   "related to spleen OR; related to low HgB"   . History of gout   . Hyperlipidemia   . Hypertension   . Ischemic cardiomyopathy    a. 12/2015 Echo: EF 25-30%, anterior and septal HK.  Marland Kitchen Left hemiplegia ([place])   . Myocardial infarction ([place]) 1999; ?2010  . On home oxygen therapy    "28% at the SNF" (6/15/2018Z)  . Pneumonia    "several times since 12/19/2015" (6/15/20180)  . Protein calorie malnutrition ([place])   . Respiratory failure ([place])   . Spleen injury   . Status post tracheostomy ([place])    a. 01/2016 in setting of ongoing resp failure and aspiration.  . Stroke ([place]) 12/19/2015   "left side paralyzed since" (04/07/2017)  . Type II diabetes mellitus (HCC)     Allergies:  Allergies  Allergen Reactions  . Codeine Anaphylaxis and Nausea And Vomiting  . Lisinopril Itching and Rash  . Hydrocodone Other (See Comments)    On MAR  . Hydrocodone-Acetaminophen Nausea And Vomiting    Cannot take any acetaminophen due to liver issues per sister  . Tegaderm Ag Mesh [Silver] Other (See Comments)  Allergy to "silver compounds" listed on MAR  . Tylenol [Acetaminophen] Nausea Only and Other (See Comments)    Cannot take any acetaminophen due to liver issues per sister    MEDICATIONS: . chlorhexidine gluconate (MEDLINE KIT)  15 mL Mouth Rinse BID  . Chlorhexidine Gluconate Cloth  6 each Topical Q0600  . feeding supplement (PRO-STAT SUGAR FREE 64)  30 mL Per Tube BID  . insulin aspart  0-15 Units Subcutaneous Q4H  . mouth rinse  15 mL Mouth Rinse QID  . mupirocin ointment  1 application Nasal BID    Social History   Tobacco Use  . Smoking status: Former Smoker    Packs/day: 2.00    Years: 38.00    Pack years: 76.00    Types: Cigarettes    Last attempt to quit: 11/25/2015    Years since quitting: 1.8  .  Smokeless tobacco: Never Used  . Tobacco comment: quit spring of 2017.  Substance Use Topics  . Alcohol use: No    Alcohol/week: 0.0 oz  . Drug use: No    Family History  Problem Relation Age of Onset  . Alcohol abuse Mother   . Hypertension Mother   . Coronary artery disease Mother   . Alcohol abuse Father   . Hypertension Father   . Coronary artery disease Father   . Alcohol abuse Maternal Uncle     Review of Systems -  Unable to obtain since he is [employer] OBJECTIVE: Temp:  [97.9 F (36.6 C)-100.7 F (38.2 C)] 99.3 F (37.4 C) (11/29 1217) Pulse Rate:  [75-115] 84 (11/29 1117) Resp:  [20-29] 28 (11/29 1117) BP: (92-140)/(61-102) 116/83 (11/29 0900) SpO2:  [90 %-100 %] 100 % (11/29 1117) Arterial Line BP: (89-141)/(52-79) 104/61 (11/29 0900) FiO2 (%):  [40 %] 40 % (11/29 1117) Weight:  [218 lb 11.1 oz (99.2 kg)] 218 lb 11.1 oz (99.2 kg) (11/29 0500) Physical Exam  Constitutional: intubated and sedated He appears well-developed and well-nourished. No distress.  HENT:  Mouth/Throat: OETT in place Cardiovascular: tachy, regular rhythm and normal heart sounds. Exam reveals no gallop and no friction rub.  No murmur heard.  Pulmonary/Chest: Effort normal and breath sounds normal. No respiratory distress. He has no wheezes.  Abdominal: Soft. Bowel sounds are decreased He exhibits no distension. There is no tenderness. Peg in place. Mild umbilical hernia Skin: Skin is warm and dry. No rash noted. No erythema. Onychomycosis to toenails bilaterally   LABS: Results for orders placed or performed during the hospital encounter of 09/20/17 (from the past 48 hour(s))  Basic metabolic panel     Status: Abnormal   Collection Time: 09/20/17  3:21 AM  Result Value Ref Range   Sodium 143 135 - 145 mmol/L   Potassium 5.3 (H) 3.5 - 5.1 mmol/L   Chloride 111 101 - 111 mmol/L   CO2 19 (L) 22 - 32 mmol/L   Glucose, Bld 166 (H) 65 - 99 mg/dL   BUN 48 (H) 6 - 20 mg/dL   Creatinine, Ser  2.66 (H) 0.61 - 1.24 mg/dL   Calcium 9.4 8.9 - 10.3 mg/dL   GFR calc non Af Amer 24 (L) >60 mL/min   GFR calc Af Amer 28 (L) >60 mL/min    Comment: (NOTE) The eGFR has been calculated using the CKD EPI equation. This calculation has not been validated in all clinical situations. eGFR's persistently <60 mL/min signify possible Chronic Kidney Disease.    Anion gap 13 5 - 15  Protime-INR  Status: Abnormal   Collection Time: 09/20/17  3:21 AM  Result Value Ref Range   Prothrombin Time 20.4 (H) 11.4 - 15.2 seconds   INR 1.77   I-Stat arterial blood gas, ED     Status: Abnormal   Collection Time: 09/20/17  3:24 AM  Result Value Ref Range   pH, Arterial 7.272 (L) 7.350 - 7.450   pCO2 arterial 37.8 32.0 - 48.0 mmHg   pO2, Arterial 68.0 (L) 83.0 - 108.0 mmHg   Bicarbonate 17.6 (L) 20.0 - 28.0 mmol/L   TCO2 19 (L) 22 - 32 mmol/L   O2 Saturation 92.0 %   Acid-base deficit 9.0 (H) 0.0 - 2.0 mmol/L   Patient temperature 97.0 F    Collection site RADIAL, ALLEN'S TEST ACCEPTABLE    Drawn by RT    Sample type ARTERIAL   Culture, blood (routine x 2)     Status: None (Preliminary result)   Collection Time: 09/20/17  3:30 AM  Result Value Ref Range   Specimen Description BLOOD RIGHT ANTECUBITAL    Special Requests IN PEDIATRIC BOTTLE Blood Culture adequate volume    Culture  Setup Time      GRAM POSITIVE COCCI IN CLUSTERS IN PEDIATRIC BOTTLE CRITICAL VALUE NOTED.  VALUE IS CONSISTENT WITH PREVIOUSLY REPORTED AND CALLED VALUE.    Culture NO GROWTH 1 DAY    Report Status PENDING   Culture, blood (routine x 2)     Status: None (Preliminary result)   Collection Time: 09/20/17  3:40 AM  Result Value Ref Range   Specimen Description BLOOD LEFT HAND    Special Requests      BOTTLES DRAWN AEROBIC AND ANAEROBIC Blood Culture adequate volume   Culture  Setup Time      GRAM POSITIVE RODS ANAEROBIC BOTTLE ONLY CRITICAL RESULT CALLED TO, READ BACK BY AND VERIFIED WITH: AMEYER,PHARMD _0   09/20/17 BY LHOWARD GRAM POSITIVE COCCI IN CLUSTERS AEROBIC BOTTLE ONLY CRITICAL RESULT CALLED TO, READ BACK BY AND VERIFIED WITH: AMEYER.PHARMD _1  09/20/17 BY LHOWARD    Culture GRAM POSITIVE RODS STAPHYLOCOCCUS AUREUS     Report Status PENDING   Blood Culture ID Panel (Reflexed)     Status: None   Collection Time: 09/20/17  3:40 AM  Result Value Ref Range   Enterococcus species NOT DETECTED NOT DETECTED   Listeria monocytogenes NOT DETECTED NOT DETECTED   Staphylococcus species NOT DETECTED NOT DETECTED   Staphylococcus aureus NOT DETECTED NOT DETECTED   Streptococcus species NOT DETECTED NOT DETECTED   Streptococcus agalactiae NOT DETECTED NOT DETECTED   Streptococcus pneumoniae NOT DETECTED NOT DETECTED   Streptococcus pyogenes NOT DETECTED NOT DETECTED   Acinetobacter baumannii NOT DETECTED NOT DETECTED   Enterobacteriaceae species NOT DETECTED NOT DETECTED   Enterobacter cloacae complex NOT DETECTED NOT DETECTED   Escherichia coli NOT DETECTED NOT DETECTED   Klebsiella oxytoca NOT DETECTED NOT DETECTED   Klebsiella pneumoniae NOT DETECTED NOT DETECTED   Proteus species NOT DETECTED NOT DETECTED   Serratia marcescens NOT DETECTED NOT DETECTED   Haemophilus influenzae NOT DETECTED NOT DETECTED   Neisseria meningitidis NOT DETECTED NOT DETECTED   Pseudomonas aeruginosa NOT DETECTED NOT DETECTED   Candida albicans NOT DETECTED NOT DETECTED   Candida glabrata NOT DETECTED NOT DETECTED   Candida krusei NOT DETECTED NOT DETECTED   Candida parapsilosis NOT DETECTED NOT DETECTED   Candida tropicalis NOT DETECTED NOT DETECTED  Blood Culture ID Panel (Reflexed)     Status: Abnormal   Collection Time: 09/20/17  3:40 AM  Result Value Ref Range   Enterococcus species NOT DETECTED NOT DETECTED   Listeria monocytogenes NOT DETECTED NOT DETECTED   Staphylococcus species DETECTED (A) NOT DETECTED    Comment: CRITICAL RESULT CALLED TO, READ BACK BY AND VERIFIED WITH: AMEYER,PHARMD  _0  09/20/17 BY LHOWARD    Staphylococcus aureus DETECTED (A) NOT DETECTED    Comment: Methicillin (oxacillin)-resistant Staphylococcus aureus (MRSA). MRSA is predictably resistant to beta-lactam antibiotics (except ceftaroline). Preferred therapy is vancomycin unless clinically contraindicated. Patient requires contact precautions if  hospitalized. CRITICAL RESULT CALLED TO, READ BACK BY AND VERIFIED WITH: AMEYER,PHARMD _1  09/20/17 BY LHOWARD    Methicillin resistance DETECTED (A) NOT DETECTED    Comment: CRITICAL RESULT CALLED TO, READ BACK BY AND VERIFIED WITH: AMEYER,PHARMD _2  09/20/17    Streptococcus species NOT DETECTED NOT DETECTED   Streptococcus agalactiae NOT DETECTED NOT DETECTED   Streptococcus pneumoniae NOT DETECTED NOT DETECTED   Streptococcus pyogenes NOT DETECTED NOT DETECTED   Acinetobacter baumannii NOT DETECTED NOT DETECTED   Enterobacteriaceae species NOT DETECTED NOT DETECTED   Enterobacter cloacae complex NOT DETECTED NOT DETECTED   Escherichia coli NOT DETECTED NOT DETECTED   Klebsiella oxytoca NOT DETECTED NOT DETECTED   Klebsiella pneumoniae NOT DETECTED NOT DETECTED   Proteus species NOT DETECTED NOT DETECTED   Serratia marcescens NOT DETECTED NOT DETECTED   Haemophilus influenzae NOT DETECTED NOT DETECTED   Neisseria meningitidis NOT DETECTED NOT DETECTED   Pseudomonas aeruginosa NOT DETECTED NOT DETECTED   Candida albicans NOT DETECTED NOT DETECTED   Candida glabrata NOT DETECTED NOT DETECTED   Candida krusei NOT DETECTED NOT DETECTED   Candida parapsilosis NOT DETECTED NOT DETECTED   Candida tropicalis NOT DETECTED NOT DETECTED  I-Stat CG4 Lactic Acid, ED     Status: Abnormal   Collection Time: 09/20/17  3:52 AM  Result Value Ref Range   Lactic Acid, Venous 6.66 (HH) 0.5 - 1.9 mmol/L   Comment NOTIFIED PHYSICIAN   I-stat troponin, ED     Status: Abnormal   Collection Time: 09/20/17  4:04 AM  Result Value Ref Range   Troponin i, poc 2.52  (HH) 0.00 - 0.08 ng/mL   Comment NOTIFIED PHYSICIAN    Comment 3            Comment: Due to the release kinetics of cTnI, a negative result within the first hours of the onset of symptoms does not rule out myocardial infarction with certainty. If myocardial infarction is still suspected, repeat the test at appropriate intervals.   CBC     Status: Abnormal   Collection Time: 09/20/17  5:45 AM  Result Value Ref Range   WBC 19.0 (H) 4.0 - 10.5 K/uL    Comment: REPEATED TO VERIFY WHITE COUNT CONFIRMED ON SMEAR    RBC 3.64 (L) 4.22 - 5.81 MIL/uL   Hemoglobin 12.1 (L) 13.0 - 17.0 g/dL   HCT 36.7 (L) 39.0 - 52.0 %   MCV 100.8 (H) 78.0 - 100.0 fL   MCH 33.2 26.0 - 34.0 pg   MCHC 33.0 30.0 - 36.0 g/dL   RDW 14.7 11.5 - 15.5 %   Platelets 236 150 - 400 K/uL  Brain natriuretic peptide     Status: Abnormal   Collection Time: 09/20/17  5:45 AM  Result Value Ref Range   B Natriuretic Peptide 1,771.9 (H) 0.0 - 100.0 pg/mL  Differential     Status: Abnormal   Collection Time: 09/20/17  5:45 AM  Result Value Ref Range  Neutrophils Relative % 64 %   Lymphocytes Relative 9 %   Monocytes Relative 4 %   Eosinophils Relative 0 %   Basophils Relative 0 %   Band Neutrophils 23 %   Metamyelocytes Relative 0 %   Myelocytes 0 %   Promyelocytes Absolute 0 %   Blasts 0 %   nRBC 0 0 /100 WBC   Other 0 %   Neutro Abs 16.5 (H) 1.7 - 7.7 K/uL   Lymphs Abs 1.7 0.7 - 4.0 K/uL   Monocytes Absolute 0.8 0.1 - 1.0 K/uL   Eosinophils Absolute 0.0 0.0 - 0.7 K/uL   Basophils Absolute 0.0 0.0 - 0.1 K/uL   WBC Morphology INCREASED BANDS (>20% BANDS)   Troponin I     Status: Abnormal   Collection Time: 09/20/17  5:59 AM  Result Value Ref Range   Troponin I 3.77 (HH) <0.03 ng/mL    Comment: CRITICAL RESULT CALLED TO, READ BACK BY AND VERIFIED WITH: CHILDRESS,W RN @ [phone] 09/20/17 LEONARD,A   I-Stat CG4 Lactic Acid, ED     Status: Abnormal   Collection Time: 09/20/17  6:04 AM  Result Value Ref Range    Lactic Acid, Venous 4.63 (HH) 0.5 - 1.9 mmol/L   Comment NOTIFIED PHYSICIAN   I-Stat arterial blood gas, ED     Status: Abnormal   Collection Time: 09/20/17  6:19 AM  Result Value Ref Range   pH, Arterial 7.375 7.350 - 7.450   pCO2 arterial 34.9 32.0 - 48.0 mmHg   pO2, Arterial 93.0 83.0 - 108.0 mmHg   Bicarbonate 20.6 20.0 - 28.0 mmol/L   TCO2 22 22 - 32 mmol/L   O2 Saturation 97.0 %   Acid-base deficit 4.0 (H) 0.0 - 2.0 mmol/L   Patient temperature 96.9 F    Collection site RADIAL, ALLEN'S TEST ACCEPTABLE    Drawn by RT    Sample type ARTERIAL   Triglycerides     Status: None   Collection Time: 09/20/17  6:59 AM  Result Value Ref Range   Triglycerides 104 <150 mg/dL  Culture, respiratory (NON-Expectorated)     Status: None (Preliminary result)   Collection Time: 09/20/17  8:20 AM  Result Value Ref Range   Specimen Description TRACHEAL ASPIRATE    Special Requests NONE    Gram Stain      ABUNDANT WBC PRESENT, PREDOMINANTLY PMN RARE SQUAMOUS EPITHELIAL CELLS PRESENT MODERATE GRAM POSITIVE COCCI IN PAIRS    Culture      MODERATE STAPHYLOCOCCUS AUREUS SUSCEPTIBILITIES TO FOLLOW    Report Status PENDING   Glucose, capillary     Status: Abnormal   Collection Time: 09/20/17  9:06 AM  Result Value Ref Range   Glucose-Capillary 154 (H) 65 - 99 mg/dL   Comment 1 Capillary Specimen    Comment 2 Notify RN   MRSA PCR Screening     Status: Abnormal   Collection Time: 09/20/17  9:16 AM  Result Value Ref Range   MRSA by PCR POSITIVE (A) NEGATIVE    Comment:        The GeneXpert MRSA Assay (FDA approved for NASAL specimens only), is one component of a comprehensive MRSA colonization surveillance program. It is not intended to diagnose MRSA infection nor to guide or monitor treatment for MRSA infections. RESULT CALLED TO, READ BACK BY AND VERIFIED WITH: Jacolyn Reedy RN 11:20 09/20/17 ([email or web address])   Glucose, capillary     Status: Abnormal   Collection Time: 09/20/17 11:06 AM  Result Value Ref Range   Glucose-Capillary 138 (H) 65 - 99 mg/dL   Comment 1 Capillary Specimen    Comment 2 Notify RN   Blood gas, arterial     Status: Abnormal   Collection Time: 09/20/17 12:38 PM  Result Value Ref Range   FIO2 90.00    Delivery systems VENTILATOR    Mode PRESSURE REGULATED VOLUME CONTROL    VT 470 mL   LHR 24 resp/min   Peep/cpap 10.0 cm H20   pH, Arterial 7.364 7.350 - 7.450   pCO2 arterial 37.4 32.0 - 48.0 mmHg   pO2, Arterial 274 (H) 83.0 - 108.0 mmHg   Bicarbonate 20.8 20.0 - 28.0 mmol/L   Acid-base deficit 3.7 (H) 0.0 - 2.0 mmol/L   O2 Saturation 99.3 %   Patient temperature 98.6    Collection site A-LINE    Drawn by [id number]    Sample type ARTERIAL DRAW   Magnesium     Status: None   Collection Time: 09/20/17  3:19 PM  Result Value Ref Range   Magnesium 1.8 1.7 - 2.4 mg/dL  Phosphorus     Status: None   Collection Time: 09/20/17  3:19 PM  Result Value Ref Range   Phosphorus 3.2 2.5 - 4.6 mg/dL  Glucose, capillary     Status: Abnormal   Collection Time: 09/20/17  4:43 PM  Result Value Ref Range   Glucose-Capillary 119 (H) 65 - 99 mg/dL   Comment 1 Capillary Specimen    Comment 2 Notify RN   Glucose, capillary     Status: Abnormal   Collection Time: 09/20/17  7:43 PM  Result Value Ref Range   Glucose-Capillary 108 (H) 65 - 99 mg/dL   Comment 1 Capillary Specimen    Comment 2 Notify RN   Heparin level (unfractionated)     Status: Abnormal   Collection Time: 09/20/17  8:41 PM  Result Value Ref Range   Heparin Unfractionated 0.14 (L) 0.30 - 0.70 IU/mL    Comment:        IF HEPARIN RESULTS ARE BELOW EXPECTED VALUES, AND PATIENT DOSAGE HAS BEEN CONFIRMED, SUGGEST FOLLOW UP TESTING OF ANTITHROMBIN III LEVELS.   Glucose, capillary     Status: Abnormal   Collection Time: 09/20/17 11:52 PM  Result Value Ref Range   Glucose-Capillary 141 (H) 65 - 99 mg/dL   Comment 1 Capillary Specimen    Comment 2 Notify RN   Glucose, capillary     Status:  Abnormal   Collection Time: 09/21/17  3:49 AM  Result Value Ref Range   Glucose-Capillary 104 (H) 65 - 99 mg/dL   Comment 1 Capillary Specimen    Comment 2 Notify RN   Blood gas, arterial     Status: None   Collection Time: 09/21/17  4:18 AM  Result Value Ref Range   FIO2 40.00    Delivery systems VENTILATOR    Mode PRESSURE REGULATED VOLUME CONTROL    VT 470 mL   LHR 24 resp/min   Peep/cpap 8.0 cm H20   pH, Arterial 7.395 7.350 - 7.450   pCO2 arterial 37.9 32.0 - 48.0 mmHg   pO2, Arterial 92.2 83.0 - 108.0 mmHg   Bicarbonate 22.7 20.0 - 28.0 mmol/L   Acid-base deficit 1.4 0.0 - 2.0 mmol/L   O2 Saturation 97.0 %   Patient temperature 98.6    Collection site ARTERIAL LINE    Drawn by [id number]    Sample type ARTERIAL DRAW    Allens  test (pass/fail) PASS PASS  Heparin level (unfractionated)     Status: Abnormal   Collection Time: 09/21/17  4:31 AM  Result Value Ref Range   Heparin Unfractionated 0.25 (L) 0.30 - 0.70 IU/mL    Comment:        IF HEPARIN RESULTS ARE BELOW EXPECTED VALUES, AND PATIENT DOSAGE HAS BEEN CONFIRMED, SUGGEST FOLLOW UP TESTING OF ANTITHROMBIN III LEVELS.   CBC     Status: Abnormal   Collection Time: 09/21/17  4:31 AM  Result Value Ref Range   WBC 16.6 (H) 4.0 - 10.5 K/uL   RBC 3.21 (L) 4.22 - 5.81 MIL/uL   Hemoglobin 10.0 (L) 13.0 - 17.0 g/dL   HCT 31.8 (L) 39.0 - 52.0 %   MCV 99.1 78.0 - 100.0 fL   MCH 31.2 26.0 - 34.0 pg   MCHC 31.4 30.0 - 36.0 g/dL   RDW 14.6 11.5 - 15.5 %   Platelets 170 150 - 400 K/uL  Comprehensive metabolic panel     Status: Abnormal   Collection Time: 09/21/17  4:31 AM  Result Value Ref Range   Sodium 144 135 - 145 mmol/L   Potassium 4.0 3.5 - 5.1 mmol/L    Comment: NO VISIBLE HEMOLYSIS   Chloride 115 (H) 101 - 111 mmol/L   CO2 22 22 - 32 mmol/L   Glucose, Bld 128 (H) 65 - 99 mg/dL   BUN 58 (H) 6 - 20 mg/dL   Creatinine, Ser 2.51 (H) 0.61 - 1.24 mg/dL   Calcium 8.6 (L) 8.9 - 10.3 mg/dL   Total Protein 6.2 (L) 6.5  - 8.1 g/dL   Albumin 2.2 (L) 3.5 - 5.0 g/dL   AST 55 (H) 15 - 41 U/L   ALT 19 17 - 63 U/L   Alkaline Phosphatase 67 38 - 126 U/L   Total Bilirubin 0.9 0.3 - 1.2 mg/dL   GFR calc non Af Amer 26 (L) >60 mL/min   GFR calc Af Amer 30 (L) >60 mL/min    Comment: (NOTE) The eGFR has been calculated using the CKD EPI equation. This calculation has not been validated in all clinical situations. eGFR's persistently <60 mL/min signify possible Chronic Kidney Disease.    Anion gap 7 5 - 15  Magnesium     Status: None   Collection Time: 09/21/17  4:31 AM  Result Value Ref Range   Magnesium 1.8 1.7 - 2.4 mg/dL  Phosphorus     Status: None   Collection Time: 09/21/17  4:31 AM  Result Value Ref Range   Phosphorus 3.0 2.5 - 4.6 mg/dL  Protime-INR     Status: Abnormal   Collection Time: 09/21/17  4:31 AM  Result Value Ref Range   Prothrombin Time 23.6 (H) 11.4 - 15.2 seconds   INR 2.13   Glucose, capillary     Status: Abnormal   Collection Time: 09/21/17  8:39 AM  Result Value Ref Range   Glucose-Capillary 141 (H) 65 - 99 mg/dL   Comment 1 Capillary Specimen    Comment 2 Notify RN   Glucose, capillary     Status: Abnormal   Collection Time: 09/21/17 12:05 PM  Result Value Ref Range   Glucose-Capillary 134 (H) 65 - 99 mg/dL   Comment 1 Capillary Specimen    Comment 2 Notify RN   Heparin level (unfractionated)     Status: Abnormal   Collection Time: 09/21/17 12:57 PM  Result Value Ref Range   Heparin Unfractionated <0.10 (L) 0.30 -  0.70 IU/mL    Comment:        IF HEPARIN RESULTS ARE BELOW EXPECTED VALUES, AND PATIENT DOSAGE HAS BEEN CONFIRMED, SUGGEST FOLLOW UP TESTING OF ANTITHROMBIN III LEVELS.   Troponin I     Status: Abnormal   Collection Time: 09/21/17 12:57 PM  Result Value Ref Range   Troponin I 8.70 (HH) <0.03 ng/mL    Comment: CRITICAL RESULT CALLED TO, READ BACK BY AND VERIFIED WITH: D.TURNER,RN 09/21/17 1434 B.DAVIS   Lactic acid, plasma     Status: None    Collection Time: 09/21/17 12:57 PM  Result Value Ref Range   Lactic Acid, Venous 1.1 0.5 - 1.9 mmol/L  Urinalysis, Routine w reflex microscopic     Status: Abnormal   Collection Time: 09/21/17 12:57 PM  Result Value Ref Range   Color, Urine YELLOW YELLOW   APPearance HAZY (A) CLEAR   Specific Gravity, Urine 1.024 1.005 - 1.030   pH 5.0 5.0 - 8.0   Glucose, UA NEGATIVE NEGATIVE mg/dL   Hgb urine dipstick LARGE (A) NEGATIVE   Bilirubin Urine NEGATIVE NEGATIVE   Ketones, ur NEGATIVE NEGATIVE mg/dL   Protein, ur 100 (A) NEGATIVE mg/dL   Nitrite NEGATIVE NEGATIVE   Leukocytes, UA NEGATIVE NEGATIVE   RBC / HPF TOO NUMEROUS TO COUNT 0 - 5 RBC/hpf   WBC, UA 6-30 0 - 5 WBC/hpf   Bacteria, UA FEW (A) NONE SEEN   Squamous Epithelial / LPF NONE SEEN NONE SEEN   Mucus PRESENT   Procalcitonin - Baseline     Status: None   Collection Time: 09/21/17 12:57 PM  Result Value Ref Range   Procalcitonin 4.14 ng/mL    Comment:        Interpretation: PCT > 2 ng/mL: Systemic infection (sepsis) is likely, unless other causes are known. (NOTE)       Sepsis PCT Algorithm           Lower Respiratory Tract                                      Infection PCT Algorithm    ----------------------------     ----------------------------         PCT < 0.25 ng/mL                PCT < 0.10 ng/mL         Strongly encourage             Strongly discourage   discontinuation of antibiotics    initiation of antibiotics    ----------------------------     -----------------------------       PCT 0.25 - 0.50 ng/mL            PCT 0.10 - 0.25 ng/mL               OR       >80% decrease in PCT            Discourage initiation of                                            antibiotics      Encourage discontinuation           of antibiotics    ----------------------------     -----------------------------  PCT >= 0.50 ng/mL              PCT 0.26 - 0.50 ng/mL               AND       <80% decrease in PCT               Encourage initiation of                                             antibiotics       Encourage continuation           of antibiotics    ----------------------------     -----------------------------        PCT >= 0.50 ng/mL                  PCT > 0.50 ng/mL               AND         increase in PCT                  Strongly encourage                                      initiation of antibiotics    Strongly encourage escalation           of antibiotics                                     -----------------------------                                           PCT <= 0.25 ng/mL                                                 OR                                        > 80% decrease in PCT                                     Discontinue / Do not initiate                                             antibiotics     MICRO: 11/28 trach aspirate cx - staph aureus 11/28 blood cx - staph aureus (BCID suggests MRSA) 11/29 urine cx pending IMAGING: Ct Head Wo Contrast  Result Date: 09/20/2017 CLINICAL DATA:  Altered level of consciousness EXAM: CT HEAD WITHOUT CONTRAST TECHNIQUE: Contiguous axial images were obtained from the base of the skull through the vertex without intravenous contrast. COMPARISON:  12/16/2016 FINDINGS: Brain: Bilateral basal ganglia lacunar infarcts. Old infarct in the right brainstem. There is atrophy and chronic small vessel disease changes. No acute intracranial abnormality. Specifically, no hemorrhage, hydrocephalus, mass lesion, acute infarction, or significant intracranial injury. Vascular: No hyperdense vessel or unexpected calcification. Skull: No acute calvarial abnormality. Sinuses/Orbits: No acute finding. Other: None IMPRESSION: Old bilateral basal ganglia lacunar infarcts and infarct in the right brainstem. No acute intracranial abnormality. Atrophy, chronic small vessel disease. Electronically Signed   By: Rolm Baptise M.D.   On: 09/20/2017 09:00   Dg Chest Port  1 View  Result Date: 09/21/2017 CLINICAL DATA:  Pneumonia EXAM: PORTABLE CHEST 1 VIEW COMPARISON:  09/20/2017 FINDINGS: Extensive bilateral airspace disease. Mild improvement in bilateral airspace disease especially the right upper lobe. Bibasilar consolidation due to atelectasis. Negative for pleural effusion Endotracheal tube in good position. NG tube enters the stomach with the tip not visualized IMPRESSION: Diffuse bilateral airspace disease with interval improvement. Endotracheal tube remains in good position. Electronically Signed   By: Franchot Gallo M.D.   On: 09/21/2017 07:30   Dg Chest Portable 1 View  Result Date: 09/20/2017 CLINICAL DATA:  Intubation. EXAM: PORTABLE CHEST 1 VIEW COMPARISON:  New earlier this day at 0316 hour FINDINGS: Endotracheal tube 4.3 cm from the carina. Patient is rotated to the right. Cardiomegaly is similar. Progression of bilateral perihilar opacities since prior exam. No pneumothorax or definite pleural fluid. IMPRESSION: 1. Endotracheal tube 4.3 cm from the carina. 2. Progression of diffuse bilateral lung opacities over the past 2 hours, favoring pulmonary edema. Electronically Signed   By: Jeb Levering M.D.   On: 09/20/2017 05:44   Dg Chest Portable 1 View  Result Date: 09/20/2017 CLINICAL DATA:  Respiratory distress. Chest discomfort and shortness of breath. EXAM: PORTABLE CHEST 1 VIEW COMPARISON:  Most recent comparison 05/06/2017 FINDINGS: Cardiomegaly, similar to increased from prior. Diffuse patchy and confluent opacities, most prominent in the perihilar and suprahilar regions. Background peribronchial cuffing. No large pleural effusion or pneumothorax. IMPRESSION: Diffuse bilateral perihilar opacities and peribronchial thickening. Considerations include pulmonary edema versus multifocal pneumonia. Cardiomegaly is similar to mildly progressed from prior. Electronically Signed   By: Jeb Levering M.D.   On: 09/20/2017 03:32   Dg Abd Portable  1v  Result Date: 09/20/2017 CLINICAL DATA:  Orogastric tube placement. EXAM: PORTABLE ABDOMEN - 1 VIEW COMPARISON:  None. FINDINGS: Enteric tube in place with tip and side-port below the diaphragm. Gastrostomy tube in the left upper quadrant. Increased air within small bowel in the central abdomen. No evidence of free air. IMPRESSION: Tip and side port of the enteric tube below the diaphragm in the stomach. Electronically Signed   By: Jeb Levering M.D.   On: 09/20/2017 05:45   Assessment/Plan: 61yo M with CAD c/b ICM EF 30%, hx of CVA s/p PEG admitted for respiratory distress and aki  2/2 severe sepsis found to have staph aureus bacteremia plus pneumonia - continue with vancomycin, can d/c piptazo - await sensitivities to isolate,  - ckd 3=  may need to consider to change to a different anti-staphylococcal agent to minimize risk of nephrotoxicity tomorrow if Cr worsens  Staph aureus pna = continue on vancomycin  Hx of hep c = will check hep c viral load

## 2017-09-21 NOTE — Progress Notes (Signed)
ANTICOAGULATION CONSULT NOTE Pharmacy Consult for heparin Indication: history of atrial thrombus  Allergies  Allergen Reactions  . Codeine Anaphylaxis and Nausea And Vomiting  . Lisinopril Itching and Rash  . Hydrocodone Other (See Comments)    On MAR  . Hydrocodone-Acetaminophen Nausea And Vomiting    Cannot take any acetaminophen due to liver issues per sister  . Tegaderm Ag Mesh [Silver] Other (See Comments)    Allergy to "silver compounds" listed on MAR  . Tylenol [Acetaminophen] Nausea Only and Other (See Comments)    Cannot take any acetaminophen due to liver issues per sister    Patient Measurements: Height: 6' (182.9 cm) Weight: 218 lb 11.1 oz (99.2 kg) IBW/kg (Calculated) : 77.6 Heparin Dosing Weight: 97 kg  Vital Signs: Temp: 99.3 F (37.4 C) (11/29 1217) Temp Source: Oral (11/29 1217) BP: 116/83 (11/29 0900) Pulse Rate: 84 (11/29 1117)  Labs: Recent Labs    09/20/17 0321 09/20/17 0545 09/20/17 0559 09/20/17 2041 09/21/17 0431 09/21/17 1257  HGB  --  12.1*  --   --  10.0*  --   HCT  --  36.7*  --   --  31.8*  --   PLT  --  236  --   --  170  --   LABPROT 20.4*  --   --   --  23.6*  --   INR 1.77  --   --   --  2.13  --   HEPARINUNFRC  --   --   --  0.14* 0.25* <0.10*  CREATININE 2.66*  --   --   --  2.51*  --   TROPONINI  --   --  3.77*  --   --  8.70*     Medical History: Past Medical History:  Diagnosis Date  . Acute respiratory failure (HCC)    a. Spring 2017 following fall/splenectomy -->admitted to [facility]-->trach/g tube.  . Anemia    a. 12/2015 ABL in setting of fall/hematomas/splenic laceration req splenectomy.  Marland Kitchen. Anxiety   . Apical mural thrombus    a. 11/2015 Echo: EF 25-30%, moderate size apical thrombus-->coumadin;  b. 12/2015 f/u Echo: EF 25-30%, ant/septa HK, no obvious large thrombus but cannot exclude small mural thrombus.  Marland Kitchen. CAD (coronary artery disease)    a. 12/2014 s/p BMS to the RCA.  Marland Kitchen. Cerebral infarction (HCC)    . Chronic bronchitis (HCC)    "probably"/sister 04/07/2017  . Chronic systolic CHF (congestive heart failure) (HCC)    a. 12/2015 Echo: EF 25-30%.  . Dementia   . Depression   . Dysphagia   . Falls    a. 11/2015 traumatic fall with resultant trauma req splenectomy and prolonged hospitalization complicated by resp failure.  . Hepatitis C    "never treated" (04/07/2017)  . History of blood transfusion 12/2015; 04/07/2017   "related to spleen OR; related to low HgB"   . History of gout   . Hyperlipidemia   . Hypertension   . Ischemic cardiomyopathy    a. 12/2015 Echo: EF 25-30%, anterior and septal HK.  Marland Kitchen. Left hemiplegia (HCC)   . Myocardial infarction (HCC) 1999; ?2010  . On home oxygen therapy    "28% at the SNF" (6/15/2018Z)  . Pneumonia    "several times since 12/19/2015" (6/15/20180)  . Protein calorie malnutrition (HCC)   . Respiratory failure (HCC)   . Spleen injury   . Status post tracheostomy (HCC)    a. 01/2016 in setting of ongoing resp  failure and aspiration.  . Stroke (HCC) 12/19/2015   "left side paralyzed since" (04/07/2017)  . Type II diabetes mellitus (HCC)     Assessment: 61 yo male admitted with SOB and AMS. Pt is on warfarin prior to admission due to a history of apical thrombus and CVA. Now on heparin while undergoing workup.   Follow-up heparin level undetectable. Called to verify with RN about proper heparin level draw. RN noted that rates and all lines were appropriately placed, and heparin drawn from A-line.   Of note, INR has trended up from 1.77 to 2.13, and AST slightly elevated, but patient has been off of warfarin since admission.  No signs/sx of bleeding documented, however H/H trending down. Trops abruptly trending up 3.77>8.7. Renal function improving Scr 2.66>2.51.    Goal of Therapy:  Heparin level 0.3-0.7 units/ml Monitor platelets by anticoagulation protocol: Yes    Plan:   Bolus heparin 3000 unitsx1 Increase heparin to 2100 units/hr 6  hour heparin level Daily heparin level, CBC Monitor clinical course, s/sx bleeding   Donnella Bi, PharmD PGY1 Acute Care Pharmacy Resident Pager: [phone] 09/21/2017 2:31 PM

## 2017-09-21 NOTE — Progress Notes (Signed)
Initial Nutrition Assessment  DOCUMENTATION CODES:   Not applicable  INTERVENTION:    Vital AF 1.2 at 60 ml/h (1440 ml per day)  Pro-stat 30 ml BID  Provides 1928 kcal (2084 kcal total with Propofol), 138 gm protein, 1168 ml free water daily  NUTRITION DIAGNOSIS:   Inadequate oral intake related to inability to eat as evidenced by NPO status.  GOAL:   Patient will meet greater than or equal to 90% of their needs  MONITOR:   Vent status, TF tolerance, Weight trends, I & O's, Skin  REASON FOR ASSESSMENT:   Ventilator, Consult Enteral/tube feeding initiation and management  ASSESSMENT:   61 yo male with PMH of COPD, HTN, malnutrition, dysphagia, PEG, dementia, L hemiplegia, CHF, ischemia cardiomyopathy, CAD, HLD, on home oxygen, DM-2, hepatitis C, stroke who was admitted on 11/28 from SNF with acute hypoxic respiratory failure, diffuse pulmonary infiltrates, requiring intubation in the ED.  Received MD Consult for TF initiation and management.  Spoke with patient's sister who reports that patient was eating a soft diet with chopped/ground meats and nectar thick liquids PTA (since July). He has a PEG, but it has not been used since July because he has been eating well. Per discussion with sister, suspect protein intake was low, related to lack of adequate dentition and need for ground/chopped meats which patient ate small amounts of. He lost weight, but recently started gaining it back.  Patient is currently intubated on ventilator support MV: 11 L/min Temp (24hrs), Avg:99.5 F (37.5 C), Min:97.9 F (36.6 C), Max:100.7 F (38.2 C)  Propofol: 5.9 ml/hr providing 156 kcal from lipid Labs reviewed. CBG's: 141-134 Medications reviewed.  NUTRITION - FOCUSED PHYSICAL EXAM:    Most Recent Value  Orbital Region  Moderate depletion  Upper Arm Region  No depletion  Thoracic and Lumbar Region  Unable to assess  Buccal Region  Unable to assess  Temple Region  Moderate  depletion  Clavicle Bone Region  Moderate depletion  Clavicle and Acromion Bone Region  Moderate depletion  Scapular Bone Region  Unable to assess  Dorsal Hand  Unable to assess  Patellar Region  Mild depletion  Anterior Thigh Region  Mild depletion  Posterior Calf Region  Moderate depletion  Edema (RD Assessment)  None  Hair  Reviewed  Eyes  Unable to assess  Mouth  Unable to assess  Skin  Reviewed  Nails  Unable to assess       Diet Order:  Diet NPO time specified  EDUCATION NEEDS:   No education needs have been identified at this time  Skin:  Skin Assessment: Skin Integrity Issues: Skin Integrity Issues:: Stage I Stage I: coccyx  Last BM:  11/29  Height:   Ht Readings from Last 1 Encounters:  09/20/17 6' (1.829 m)    Weight:   Wt Readings from Last 1 Encounters:  09/21/17 218 lb 11.1 oz (99.2 kg)    Ideal Body Weight:  80.9 kg  BMI:  Body mass index is 29.66 kg/m.  Estimated Nutritional Needs:   Kcal:  2075  Protein:  125-140 gm  Fluid:  2.1 L   Joaquin Courts, RD, LDN, CNSC Pager [phone] After Hours Pager [phone]

## 2017-09-21 NOTE — Progress Notes (Signed)
ANTICOAGULATION CONSULT NOTE Pharmacy Consult for heparin Indication: history of atrial thrombus  Allergies  Allergen Reactions  . Codeine Anaphylaxis and Nausea And Vomiting  . Lisinopril Itching and Rash  . Hydrocodone Other (See Comments)    On MAR  . Hydrocodone-Acetaminophen Nausea And Vomiting    Cannot take any acetaminophen due to liver issues per sister  . Tegaderm Ag Mesh [Silver] Other (See Comments)    Allergy to "silver compounds" listed on MAR  . Tylenol [Acetaminophen] Nausea Only and Other (See Comments)    Cannot take any acetaminophen due to liver issues per sister    Patient Measurements: Height: 6' (182.9 cm) Weight: 218 lb 11.1 oz (99.2 kg) IBW/kg (Calculated) : 77.6 Heparin Dosing Weight: 97 kg  Vital Signs: Temp: 99.6 F (37.6 C) (11/29 0350) Temp Source: Oral (11/29 0350) BP: 118/80 (11/29 0600) Pulse Rate: 91 (11/29 0600)  Labs: Recent Labs    09/20/17 0321 09/20/17 0545 09/20/17 0559 09/20/17 2041 09/21/17 0431  HGB  --  12.1*  --   --  10.0*  HCT  --  36.7*  --   --  31.8*  PLT  --  236  --   --  170  LABPROT 20.4*  --   --   --  23.6*  INR 1.77  --   --   --  2.13  HEPARINUNFRC  --   --   --  0.14* 0.25*  CREATININE 2.66*  --   --   --  2.51*  TROPONINI  --   --  3.77*  --   --      Medical History: Past Medical History:  Diagnosis Date  . Acute respiratory failure (HCC)    a. Spring 2017 following fall/splenectomy -->admitted to [facility]-->trach/g tube.  . Anemia    a. 12/2015 ABL in setting of fall/hematomas/splenic laceration req splenectomy.  Marland Kitchen Anxiety   . Apical mural thrombus    a. 11/2015 Echo: EF 25-30%, moderate size apical thrombus-->coumadin;  b. 12/2015 f/u Echo: EF 25-30%, ant/septa HK, no obvious large thrombus but cannot exclude small mural thrombus.  Marland Kitchen CAD (coronary artery disease)    a. 12/2014 s/p BMS to the RCA.  Marland Kitchen Cerebral infarction (HCC)   . Chronic bronchitis (HCC)    "probably"/sister  04/07/2017  . Chronic systolic CHF (congestive heart failure) (HCC)    a. 12/2015 Echo: EF 25-30%.  . Dementia   . Depression   . Dysphagia   . Falls    a. 11/2015 traumatic fall with resultant trauma req splenectomy and prolonged hospitalization complicated by resp failure.  . Hepatitis C    "never treated" (04/07/2017)  . History of blood transfusion 12/2015; 04/07/2017   "related to spleen OR; related to low HgB"   . History of gout   . Hyperlipidemia   . Hypertension   . Ischemic cardiomyopathy    a. 12/2015 Echo: EF 25-30%, anterior and septal HK.  Marland Kitchen Left hemiplegia (HCC)   . Myocardial infarction (HCC) 1999; ?2010  . On home oxygen therapy    "28% at the SNF" (6/15/2018Z)  . Pneumonia    "several times since 12/19/2015" (6/15/20180)  . Protein calorie malnutrition (HCC)   . Respiratory failure (HCC)   . Spleen injury   . Status post tracheostomy (HCC)    a. 01/2016 in setting of ongoing resp failure and aspiration.  . Stroke (HCC) 12/19/2015   "left side paralyzed since" (04/07/2017)  . Type II diabetes mellitus (HCC)  Assessment: 61 yo male admitted with SOB and AMS. Pt is on warfarin prior to admission due to a history of apical thrombus and CVA. Now on heparin while undergoing workup.  -Initial heparin level= 0.14 - F/u level subtherapeutic at 0.25, but drawn around 6 hours.  No signs/sx of bleeding documented, however H/H trending down. Renal function improving Scr 2.66>2.51.   Goal of Therapy:  Heparin level 0.3-0.7 units/ml Monitor platelets by anticoagulation protocol: Yes    Plan:   Increase heparin to 1700 units/hr 6 hour heparin level Daily heparin level, CBC Monitor clinical course, s/sx bleeding   Donnella Biyler Alese Furniss, PharmD PGY1 Acute Care Pharmacy Resident Pager: [phone] 09/21/2017 6:52 AM

## 2017-09-21 NOTE — Progress Notes (Addendum)
PULMONARY / CRITICAL CARE MEDICINE   Name: Curtis Keller MRN: [id number] DOB: Mar 15, 1956    ADMISSION DATE:  09/20/2017 CONSULTATION DATE:  10/20/2017  REFERRING MD:  ED  CHIEF COMPLAINT:  Shortness of breath  HISTORY OF PRESENT ILLNESS:   61 yr old with multiple comorbidity including COPD, CHF EF 20%, CAD, apical mural thrombus resides in a nursing home was having altered mental status for 2 days which was thought to be due to UTI. He was altered as per his sister for few days and this morning  he started having severe shortness of breath saturating in the 50s with minimal improvemnet to the 70s on CPAP so a decision was made to intubate in the ED which was very difficult as per the ED physician. Please refer to their note.   CXR was showing bilateral infiltrates and is receiving cefepime and vancomycin.    VITAL SIGNS: BP 116/83   Pulse 75   Temp 99.6 F (37.6 C) (Oral)   Resp 20   Ht 6' (1.829 m)   Wt 218 lb 11.1 oz (99.2 kg)   SpO2 100%   BMI 29.66 kg/m   HEMODYNAMICS:  no pressors stable   VENTILATOR SETTINGS: Vent Mode: PRVC FiO2 (%):  [40 %-90 %] 40 % Set Rate:  [24 bmp] 24 bmp Vt Set:  [470 mL-620 mL] 470 mL PEEP:  [8 cmH20-10 cmH20] 8 cmH20 Plateau Pressure:  [13 cmH20-26 cmH20] 13 cmH20  INTAKE / OUTPUT: I/O last 3 completed shifts: In: 3337.1 [I.V.:817.1; Other:30; NG/GT:640; IV Piggyback:1850] Out: 490 [Urine:415; Drains:75]  PHYSICAL EXAMINATION: Blood pressure 116/83, pulse 75, temperature 99.6 F (37.6 C), temperature source Oral, resp. rate 20, height 6' (1.829 m), weight 218 lb 11.1 oz (99.2 kg), SpO2 100 %. Gen:      No acute distress HEENT:  EOMI, sclera anicteric, ETT Neck:     No masses; no thyromegaly Lungs:   Irregular; normal respiratory effort CV:         Rhonchi; no murmurs Abd:      + bowel sounds; soft, non-tender; no palpable masses, no distension Ext:    No edema; adequate peripheral perfusion Skin:      Warm and dry; no  rash Neuro: Sedated, lt hemiplegia  LABS:  BMET Recent Labs  Lab 09/20/17 0321 09/21/17 0431  NA 143 144  K 5.3* 4.0  CL 111 115*  CO2 19* 22  BUN 48* 58*  CREATININE 2.66* 2.51*  GLUCOSE 166* 128*    Electrolytes Recent Labs  Lab 09/20/17 0321 09/20/17 1519 09/21/17 0431  CALCIUM 9.4  --  8.6*  MG  --  1.8 1.8  PHOS  --  3.2 3.0    CBC Recent Labs  Lab 09/20/17 0545 09/21/17 0431  WBC 19.0* 16.6*  HGB 12.1* 10.0*  HCT 36.7* 31.8*  PLT 236 170    Coag's Recent Labs  Lab 09/20/17 0321 09/21/17 0431  INR 1.77 2.13    Sepsis Markers Recent Labs  Lab 09/20/17 0352 09/20/17 0604  LATICACIDVEN 6.66* 4.63*    ABG Recent Labs  Lab 09/20/17 0619 09/20/17 1238 09/21/17 0418  PHART 7.375 7.364 7.395  PCO2ART 34.9 37.4 37.9  PO2ART 93.0 274* 92.2    Liver Enzymes Recent Labs  Lab 09/21/17 0431  AST 55*  ALT 19  ALKPHOS 67  BILITOT 0.9  ALBUMIN 2.2*    Cardiac Enzymes Recent Labs  Lab 09/20/17 0559  TROPONINI 3.77*    Glucose Recent Labs  Lab  09/20/17 1106 09/20/17 1643 09/20/17 1943 09/20/17 2352 09/21/17 0349 09/21/17 0839  GLUCAP 138* 119* 108* 141* 104* 141*    Imaging Dg Chest Port 1 View  Result Date: 09/21/2017 CLINICAL DATA:  Pneumonia EXAM: PORTABLE CHEST 1 VIEW COMPARISON:  09/20/2017 FINDINGS: Extensive bilateral airspace disease. Mild improvement in bilateral airspace disease especially the right upper lobe. Bibasilar consolidation due to atelectasis. Negative for pleural effusion Endotracheal tube in good position. NG tube enters the stomach with the tip not visualized IMPRESSION: Diffuse bilateral airspace disease with interval improvement. Endotracheal tube remains in good position. Electronically Signed   By: Marlan Palauharles  Clark M.D.   On: 09/21/2017 07:30   Culture data Sputum culture: 11/28>>> Blood culture 11/28>>> MRSA  ANTIBIOTICS: Zosyn 11/28>>> Vanc 11/28>>>  ASSESSMENT / PLAN: Acute hypoxemic  respiratory failure in the setting of diffuse pulmonary infiltrates .   Likely has healthcare associated pneumonia with ALI  Plan Continue vent support ARDS protocol Follow CXR  SIRS sepsis-septic shock-->volume responsive  Systolic HFrEF (20%) Elevated troponin, suspect demand ischemia he is not a candidate for intervention Plan Cont MIVFs Tele monitoring Follow cardiac enzymes Echocardiogram  History of left ventricular thrombus, on chronic warfarin therapy Plan Hold heparin drip and INR is > 2  Acute on chronic kidney disease; it appears as though his baseline creatinine ranges from 1.7 to around 2.4; presents with creatinine of 2.66 on admission Plan Continue heparin gtt  Lactic acidosis in the setting of severe sepsis, septic shock. Plan Follow lactic acid  Metabolic encephalopathy; with history of stroke and left-sided hemiplegia since June 2018 CT head was completed there was no acute abnormality Plan PAD protocol RASS goal -2  History of dysphasia; has chronic PEG, Plan Start tube feeds  History of diabetes Plan SSI  Chronic anemia without evidence of bleeding Plan Trend CBC  History of hep C Plan Universal precautions  FAMILY  - Updates: sister bedside is a nurse and updated   The patient is critically ill with multiple organ system failure and requires high complexity decision making for assessment and support, frequent evaluation and titration of therapies, advanced monitoring, review of radiographic studies and interpretation of complex data.   Critical Care Time devoted to patient care services, exclusive of separately billable procedures, described in this note is 35 minutes.   Chilton GreathousePraveen Laron Boorman MD [facility] Pager [phone] If no answer or after 3pm call: [phone] 09/21/2017, 10:42 AM

## 2017-09-22 ENCOUNTER — Inpatient Hospital Stay (HOSPITAL_COMMUNITY): Payer: Medicare Other

## 2017-09-22 DIAGNOSIS — J9601 Acute respiratory failure with hypoxia: Secondary | ICD-10-CM

## 2017-09-22 LAB — PROTIME-INR
INR: 1.77
Prothrombin Time: 20.5 seconds — ABNORMAL HIGH (ref 11.4–15.2)

## 2017-09-22 LAB — COMPREHENSIVE METABOLIC PANEL
ALK PHOS: 66 U/L (ref 38–126)
ALT: 16 U/L — AB (ref 17–63)
AST: 30 U/L (ref 15–41)
Albumin: 2.1 g/dL — ABNORMAL LOW (ref 3.5–5.0)
Anion gap: 6 (ref 5–15)
BILIRUBIN TOTAL: 0.5 mg/dL (ref 0.3–1.2)
BUN: 65 mg/dL — AB (ref 6–20)
CALCIUM: 8.6 mg/dL — AB (ref 8.9–10.3)
CO2: 23 mmol/L (ref 22–32)
CREATININE: 2.59 mg/dL — AB (ref 0.61–1.24)
Chloride: 114 mmol/L — ABNORMAL HIGH (ref 101–111)
GFR calc Af Amer: 29 mL/min — ABNORMAL LOW (ref >60)
GFR, EST NON AFRICAN AMERICAN: 25 mL/min — AB (ref >60)
Glucose, Bld: 133 mg/dL — ABNORMAL HIGH (ref 65–99)
Potassium: 4 mmol/L (ref 3.5–5.1)
Sodium: 143 mmol/L (ref 135–145)
TOTAL PROTEIN: 6.2 g/dL — AB (ref 6.5–8.1)

## 2017-09-22 LAB — ECHOCARDIOGRAM COMPLETE
HEIGHTINCHES: 72 in
Weight: 3453.29 oz

## 2017-09-22 LAB — BLOOD GAS, ARTERIAL
ACID-BASE DEFICIT: 1.8 mmol/L (ref 0.0–2.0)
Bicarbonate: 22.7 mmol/L (ref 20.0–28.0)
DRAWN BY: 511911
FIO2: 30
LHR: 24 {breaths}/min
O2 SAT: 98.2 %
PEEP/CPAP: 8 cmH2O
PH ART: 7.372 (ref 7.350–7.450)
Patient temperature: 98.7
VT: 470 mL
pCO2 arterial: 40 mmHg (ref 32.0–48.0)
pO2, Arterial: 114 mmHg — ABNORMAL HIGH (ref 83.0–108.0)

## 2017-09-22 LAB — CULTURE, BLOOD (ROUTINE X 2)
SPECIAL REQUESTS: ADEQUATE
SPECIAL REQUESTS: ADEQUATE

## 2017-09-22 LAB — URINE CULTURE: CULTURE: NO GROWTH

## 2017-09-22 LAB — CULTURE, RESPIRATORY

## 2017-09-22 LAB — PHOSPHORUS: PHOSPHORUS: 3.3 mg/dL (ref 2.5–4.6)

## 2017-09-22 LAB — GLUCOSE, CAPILLARY
GLUCOSE-CAPILLARY: 108 mg/dL — AB (ref 65–99)
GLUCOSE-CAPILLARY: 124 mg/dL — AB (ref 65–99)
GLUCOSE-CAPILLARY: 139 mg/dL — AB (ref 65–99)
Glucose-Capillary: 117 mg/dL — ABNORMAL HIGH (ref 65–99)
Glucose-Capillary: 118 mg/dL — ABNORMAL HIGH (ref 65–99)
Glucose-Capillary: 122 mg/dL — ABNORMAL HIGH (ref 65–99)
Glucose-Capillary: 131 mg/dL — ABNORMAL HIGH (ref 65–99)
Glucose-Capillary: 97 mg/dL (ref 65–99)

## 2017-09-22 LAB — CULTURE, RESPIRATORY W GRAM STAIN

## 2017-09-22 LAB — CBC
HCT: 31.3 % — ABNORMAL LOW (ref 39.0–52.0)
HEMOGLOBIN: 9.9 g/dL — AB (ref 13.0–17.0)
MCH: 31.5 pg (ref 26.0–34.0)
MCHC: 31.6 g/dL (ref 30.0–36.0)
MCV: 99.7 fL (ref 78.0–100.0)
Platelets: 153 10*3/uL (ref 150–400)
RBC: 3.14 MIL/uL — AB (ref 4.22–5.81)
RDW: 15 % (ref 11.5–15.5)
WBC: 12.6 10*3/uL — ABNORMAL HIGH (ref 4.0–10.5)

## 2017-09-22 LAB — TROPONIN I: Troponin I: 6.19 ng/mL (ref <0.03)

## 2017-09-22 LAB — HEPARIN LEVEL (UNFRACTIONATED)
HEPARIN UNFRACTIONATED: 0.34 [IU]/mL (ref 0.30–0.70)
HEPARIN UNFRACTIONATED: 0.26 [IU]/mL — AB (ref 0.30–0.70)
HEPARIN UNFRACTIONATED: 0.36 [IU]/mL (ref 0.30–0.70)

## 2017-09-22 LAB — LACTIC ACID, PLASMA: LACTIC ACID, VENOUS: 0.9 mmol/L (ref 0.5–1.9)

## 2017-09-22 LAB — MAGNESIUM: Magnesium: 2 mg/dL (ref 1.7–2.4)

## 2017-09-22 LAB — VANCOMYCIN, TROUGH: Vancomycin Tr: 17 ug/mL (ref 15–20)

## 2017-09-22 MED ORDER — IOPAMIDOL (ISOVUE-300) INJECTION 61%
INTRAVENOUS | Status: AC
  Administered 2017-09-22: 16:00:00
  Filled 2017-09-22: qty 30

## 2017-09-22 MED ORDER — FREE WATER
200.0000 mL | Freq: Three times a day (TID) | Status: DC
  Administered 2017-09-22 – 2017-09-30 (×23): 200 mL

## 2017-09-22 MED ORDER — PERFLUTREN LIPID MICROSPHERE
1.0000 mL | INTRAVENOUS | Status: AC | PRN
  Administered 2017-09-22: 2 mL via INTRAVENOUS
  Filled 2017-09-22: qty 10

## 2017-09-22 MED ORDER — FAMOTIDINE IN NACL 20-0.9 MG/50ML-% IV SOLN
20.0000 mg | INTRAVENOUS | Status: DC
  Administered 2017-09-23 – 2017-09-26 (×4): 20 mg via INTRAVENOUS
  Filled 2017-09-22 (×5): qty 50

## 2017-09-22 MED ORDER — METRONIDAZOLE IN NACL 5-0.79 MG/ML-% IV SOLN
500.0000 mg | Freq: Three times a day (TID) | INTRAVENOUS | Status: AC
  Administered 2017-09-22 – 2017-10-05 (×41): 500 mg via INTRAVENOUS
  Filled 2017-09-22 (×41): qty 100

## 2017-09-22 MED ORDER — HEPARIN BOLUS VIA INFUSION
1500.0000 [IU] | Freq: Once | INTRAVENOUS | Status: AC
  Administered 2017-09-22: 1500 [IU] via INTRAVENOUS
  Filled 2017-09-22: qty 1500

## 2017-09-22 NOTE — Progress Notes (Addendum)
Pharmacy Antibiotic Note  Curtis Keller is a 61 y.o. male admitted on 09/20/2017 with SOB requiring intubation, possible PNA. With MRSA bacteremia. Pharmacy has been consulted for Vancomycin. Day 3 of vancomycin.  Vanc random = 17 (goal 15-20), drawn prior to 3rd dose to ensure vanc was not accumulating. WBC trending down, afebrile. Renal function slightly bumped overnight but relatively stable. UOP is 0.4 currently.  Plan: Continue Vancomycin 750 mg IV q24h Monitor clinic progress, renal function, electrolytes F/u BCx, C/S, vancomycin trough levels as needed, future de-escalation, & length of therapy  Height: 6' (182.9 cm) Weight: 215 lb 13.3 oz (97.9 kg) IBW/kg (Calculated) : 77.6  Temp (24hrs), Avg:99.2 F (37.3 C), Min:98.7 F (37.1 C), Max:99.6 F (37.6 C)  Recent Labs  Lab 09/20/17 0321 09/20/17 0352 09/20/17 0545 09/20/17 0604 09/21/17 0431 09/21/17 1257 09/22/17 0459  WBC  --   --  19.0*  --  16.6*  --  12.6*  CREATININE 2.66*  --   --   --  2.51*  --  2.59*  LATICACIDVEN  --  6.66*  --  4.63*  --  1.1 0.9  VANCOTROUGH  --   --   --   --   --   --  17    Estimated Creatinine Clearance: 35.8 mL/min (A) (by C-G formula based on SCr of 2.59 mg/dL (H)).    Allergies  Allergen Reactions  . Codeine Anaphylaxis and Nausea And Vomiting  . Lisinopril Itching and Rash  . Hydrocodone Other (See Comments)    On MAR  . Hydrocodone-Acetaminophen Nausea And Vomiting    Cannot take any acetaminophen due to liver issues per sister  . Tegaderm Ag Mesh [Silver] Other (See Comments)    Allergy to "silver compounds" listed on MAR  . Tylenol [Acetaminophen] Nausea Only and Other (See Comments)    Cannot take any acetaminophen due to liver issues per sister    Antimicrobials this admission: 11/28 Vanc >>  11/28 Zosyn >> 11/29  Dose adjustments this admission: none  Microbiology results: 11/28 BCx: ngtd 11/28 BCID: MRSA detected 11/29 BCx: sent 11/29 UCx: sent 11/29  Sputum: moderate staph arueus, susceptibilities pending    Thank you for allowing pharmacy to be a part of this patient's care.  Donnella Bi, PharmD PGY1 Acute Care Pharmacy Resident Pager: [phone] 09/22/2017 8:17 AM

## 2017-09-22 NOTE — Progress Notes (Signed)
PULMONARY / CRITICAL CARE MEDICINE   Name: Curtis Keller MRN: [id number] DOB: 11-01-1955    ADMISSION DATE:  09/20/2017 CONSULTATION DATE:  10/20/2017  REFERRING MD:  ED  CHIEF COMPLAINT:  Shortness of breath  HISTORY OF PRESENT ILLNESS:   61 yr old with multiple comorbidity including COPD, CHF EF 20%, CAD, apical mural thrombus resides in a nursing home was having altered mental status for 2 days which was thought to be due to UTI. He was altered as per his sister for few days and this morning  he started having severe shortness of breath saturating in the 50s with minimal improvemnet to the 70s on CPAP so a decision was made to intubate in the ED which was very difficult as per the ED physician. Please refer to their note.   CXR was showing bilateral infiltrates and is receiving cefepime and vancomycin.   Subjective: Remains minimally responsive, hemodynamically stable VITAL SIGNS: BP 117/72 (BP Location: Right Arm)   Pulse 87   Temp 99.2 F (37.3 C) (Oral)   Resp 20   Ht 6' (1.829 m)   Wt 215 lb 13.3 oz (97.9 kg)   SpO2 100%   BMI 29.27 kg/m   HEMODYNAMICS:  no pressors stable   VENTILATOR SETTINGS: Vent Mode: PRVC FiO2 (%):  [30 %-40 %] 30 % Set Rate:  [24 bmp] 24 bmp Vt Set:  [470 mL] 470 mL PEEP:  [8 cmH20] 8 cmH20 Plateau Pressure:  [11 cmH20-20 cmH20] 18 cmH20  INTAKE / OUTPUT: I/O last 3 completed shifts: In: 3480.6 [I.V.:1390.6; Other:30; NG/GT:1660; IV Piggyback:400] Out: 1105 [Urine:1105]  PHYSICAL EXAMINATION: General: Chronically ill-appearing 61 year old male sedated on the ventilator. HEENT: Orally intubated, no jugular venous distention, mucous membranes are moist. Pulmonary: Scattered rhonchi, equal breath sounds, develops some accessory muscle use when attempting to do pressure support ventilation. Cardiac: Regular regular with frequent ectopy on telemetry. Abdomen: Soft nontender no organomegaly with exception of abdominal hernia notable with  cough.  Positive bowel sounds, tolerating tube feeds. Extremities/musculoskeletal: Warm, dry, weak pulses.   neuro/psych: Grimaces to noxious stimulus only LABS:  BMET Recent Labs  Lab 09/20/17 0321 09/21/17 0431 09/22/17 0459  NA 143 144 143  K 5.3* 4.0 4.0  CL 111 115* 114*  CO2 19* 22 23  BUN 48* 58* 65*  CREATININE 2.66* 2.51* 2.59*  GLUCOSE 166* 128* 133*    Electrolytes Recent Labs  Lab 09/20/17 0321  09/21/17 0431 09/21/17 2127 09/22/17 0459  CALCIUM 9.4  --  8.6*  --  8.6*  MG  --    < > 1.8 2.0 2.0  PHOS  --    < > 3.0 3.4 3.3   < > = values in this interval not displayed.    CBC Recent Labs  Lab 09/20/17 0545 09/21/17 0431 09/22/17 0459  WBC 19.0* 16.6* 12.6*  HGB 12.1* 10.0* 9.9*  HCT 36.7* 31.8* 31.3*  PLT 236 170 153    Coag's Recent Labs  Lab 09/20/17 0321 09/21/17 0431 09/22/17 0459  INR 1.77 2.13 1.77    Sepsis Markers Recent Labs  Lab 09/20/17 0604 09/21/17 1257 09/22/17 0459  LATICACIDVEN 4.63* 1.1 0.9  PROCALCITON  --  4.14  --     ABG Recent Labs  Lab 09/20/17 1238 09/21/17 0418 09/22/17 0343  PHART 7.364 7.395 7.372  PCO2ART 37.4 37.9 40.0  PO2ART 274* 92.2 114*    Liver Enzymes Recent Labs  Lab 09/21/17 0431 09/22/17 0459  AST 55* 30  ALT  19 16*  ALKPHOS 67 66  BILITOT 0.9 0.5  ALBUMIN 2.2* 2.1*    Cardiac Enzymes Recent Labs  Lab 09/20/17 0559 09/21/17 1257 09/22/17 0459  TROPONINI 3.77* 8.70* 6.19*    Glucose Recent Labs  Lab 09/21/17 1205 09/21/17 1629 09/21/17 2049 09/22/17 0013 09/22/17 0429 09/22/17 0850  GLUCAP 134* 131* 124* 122* 131* 139*    Imaging Dg Chest Port 1 View  Result Date: 09/22/2017 CLINICAL DATA:  Acute respiratory failure EXAM: PORTABLE CHEST 1 VIEW COMPARISON:  09/21/2017 FINDINGS: Endotracheal tube in good position.  Gastric tube in the stomach. COPD. Improvement in diffuse bilateral airspace disease since the prior study. Airspace disease most prominent in  the left base. No significant effusion IMPRESSION: COPD. Diffuse bilateral airspace disease with interval improvement. Possible clearing edema. Electronically Signed   By: Marlan Palau M.D.   On: 09/22/2017 09:18   Culture data Sputum culture: 11/28>>> Blood culture 11/28>>> MRSA  ANTIBIOTICS: Zosyn 11/28>>> Vanc 11/28>>>  ASSESSMENT / PLAN: Acute hypoxemic respiratory failure in the setting of diffuse pulmonary infiltrates .   MRSA  pneumonia with ALI  -pcxr personally reviewed: this demonstrates improved aeration and decrease in bilateral infiltrates.  -~ 5 liters + since admit Plan  decrease PEEP to 5 Continue daily assessment for weaning PAD protocol with goal RASS 0 Continue antibiotics for MRSA pneumonia  SIRS sepsis-septic shock-->volume responsive w/ Clostridium Perfringens and MRSA bacteremia  Systolic HFrEF (20%) Elevated troponin, suspect demand ischemia he is not a candidate for intervention; this has trended down  Plan abx per ID (day # 4/x vanc; #1 flagyl) F/u echo Getting abd/pelvis CT to look for source of C perfringens  Tele  Will hold off on resuming bb for now   History of left ventricular thrombus, on chronic warfarin therapy Plan Cont heparin gtt for now    Acute on chronic kidney disease; it appears as though his baseline creatinine ranges from 1.7 to around 2.4; presents with creatinine of 2.66 on admission (stable) Mild hyperchloremia  Plan Add some free water replacement  Repeat chem in am    Metabolic encephalopathy; with history of stroke and left-sided hemiplegia since June 2018 CT head was completed there was no acute abnormality Plan PAD protocol  Changing RASS to 0 to -1  CT head  History of dysphasia; has chronic PEG, Plan Cont tube feeds  History of diabetes-->stable  Plan ssi   Chronic anemia without evidence of bleeding Plan Trend cbc  History of hep C Plan Continue universal precautions    Resolved issues   Lactic acidosis  FAMILY  - Updates: sister bedside is a nurse and updated   My cct 40 min  Simonne Martinet ACNP-BC [facility] Pulmonary/Critical Care Pager # [phone] OR # [phone] if no answer

## 2017-09-22 NOTE — Progress Notes (Signed)
ANTICOAGULATION CONSULT NOTE - Follow Up Consult  Pharmacy Consult for heparin Indication: h/o apical thrombus  Labs: Recent Labs    09/20/17 0321  09/20/17 0545 09/20/17 0559  09/21/17 0431 09/21/17 1257 09/21/17 2127 09/22/17 0459 09/22/17 1300  HGB  --    < > 12.1*  --   --  10.0*  --   --  9.9*  --   HCT  --   --  36.7*  --   --  31.8*  --   --  31.3*  --   PLT  --   --  236  --   --  170  --   --  153  --   LABPROT 20.4*  --   --   --   --  23.6*  --   --  20.5*  --   INR 1.77  --   --   --   --  2.13  --   --  1.77  --   HEPARINUNFRC  --   --   --   --    < > 0.25* <0.10* 0.21* 0.34 0.26*  CREATININE 2.66*  --   --   --   --  2.51*  --   --  2.59*  --   TROPONINI  --   --   --  3.77*  --   --  8.70*  --  6.19*  --    < > = values in this interval not displayed.    Assessment/Plan:  61yo male being treated with heparin for mural thrombus is subtherapeutic, 0.26 on current heparin infusion rate of 2250 units/hr. No signs or symptoms of bleeding per RN, and all lines attached appropriately. CBCs stable and troponins trending down. Of note, lab was sent down 3 hours later than 1200 heparin level. Dosing based on TBW.   Plan Rebolus heparin 1500 units x1 Increase heparin infusion to 2450 units/hr 6 hour heparin level Daily heparin level, CBC Monitor clinical course, s/sx bleeding  Donnella Bi, PharmD PGY1 Acute Care Pharmacy Resident Pager: [phone] 09/22/2017 3:31 PM

## 2017-09-22 NOTE — Progress Notes (Signed)
ANTICOAGULATION CONSULT NOTE - Follow Up Consult  Pharmacy Consult for heparin Indication: h/o apical thrombus  Labs: Recent Labs    09/20/17 0321  09/20/17 0545 09/20/17 0559  09/21/17 0431 09/21/17 1257  09/22/17 0459 09/22/17 1300 09/22/17 2230  HGB  --    < > 12.1*  --   --  10.0*  --   --  9.9*  --   --   HCT  --   --  36.7*  --   --  31.8*  --   --  31.3*  --   --   PLT  --   --  236  --   --  170  --   --  153  --   --   LABPROT 20.4*  --   --   --   --  23.6*  --   --  20.5*  --   --   INR 1.77  --   --   --   --  2.13  --   --  1.77  --   --   HEPARINUNFRC  --   --   --   --    < > 0.25* <0.10*   < > 0.34 0.26* 0.36  CREATININE 2.66*  --   --   --   --  2.51*  --   --  2.59*  --   --   TROPONINI  --   --   --  3.77*  --   --  8.70*  --  6.19*  --   --    < > = values in this interval not displayed.   Assessment:  61yo male admitted with SOB. Patient on warfarin PTA for h/o of apical mural thrombus. Pharmacy consulted to dose heparin while warfarin on hold.   Heparin level is therapeutic at 0.36. No new CBC, however, no s/s overt bleeding documented.   Plan Continue heparin gtt at 2450 units/hr Confirm heparin level with AM labs Daily heparin level and CBC Monitor for s/s bleeding   Einar Crow, PharmD Clinical Pharmacist 09/22/17 11:29 PM

## 2017-09-22 NOTE — Progress Notes (Signed)
ANTICOAGULATION CONSULT NOTE - Follow Up Consult  Pharmacy Consult for heparin Indication: h/o apical thrombus  Labs: Recent Labs    09/20/17 0321  09/20/17 0545 09/20/17 0559  09/21/17 0431 09/21/17 1257 09/21/17 2127 09/22/17 0459  HGB  --    < > 12.1*  --   --  10.0*  --   --  9.9*  HCT  --   --  36.7*  --   --  31.8*  --   --  31.3*  PLT  --   --  236  --   --  170  --   --  153  LABPROT 20.4*  --   --   --   --  23.6*  --   --  20.5*  INR 1.77  --   --   --   --  2.13  --   --  1.77  HEPARINUNFRC  --   --   --   --    < > 0.25* <0.10* 0.21* 0.34  CREATININE 2.66*  --   --   --   --  2.51*  --   --   --   TROPONINI  --   --   --  3.77*  --   --  8.70*  --   --    < > = values in this interval not displayed.    Assessment/Plan:  61yo male therapeutic on heparin after rate change. Will continue gtt at current rate and confirm stable with additional level.   Vernard Gambles, PharmD, BCPS  09/22/2017,6:08 AM

## 2017-09-22 NOTE — Progress Notes (Signed)
Patient with Clostridium Perfringes from 11/27 BCx.   Will add Flagyl 500 mg TID IV.   Check abdominal/pelvis CT w/o contrast to look for source.   Rexene Alberts, MSN, NP-C [facility] Infectious [facility] Cell: [phone] Pager: [phone] 09/22/2017  10:10 AM

## 2017-09-22 NOTE — Progress Notes (Signed)
[facility] for Infectious Disease    Date of Admission:  09/20/2017   Total days of antibiotics 4        Day 4 vanco        Day 1 metro           ID: Curtis Keller is a 61 y.o. male with severe sepsis with acute respiratory failure found to have mrsa pneumonia plus bacteremia but also + blood cx for c.perfringis Active Problems:   Acute systolic (congestive) heart failure (HCC)   Acute respiratory failure with hypoxia (HCC)   Acute hypoxemic respiratory failure (HCC)    Subjective: dysynchronous on vent yesteday requiring to be back on propofol. Remains afebrile, still having thick secretions.  Blood cx identified in 1 of 2 sets c.perfringens. No diarrhea. abd exam is non-revealing  Medications:  . iopamidol      . chlorhexidine gluconate (MEDLINE KIT)  15 mL Mouth Rinse BID  . Chlorhexidine Gluconate Cloth  6 each Topical Q0600  . feeding supplement (PRO-STAT SUGAR FREE 64)  30 mL Per Tube BID  . free water  200 mL Per Tube Q8H  . insulin aspart  0-15 Units Subcutaneous Q4H  . mouth rinse  15 mL Mouth Rinse QID  . mupirocin ointment  1 application Nasal BID    Objective: Vital signs in last 24 hours: Temp:  [98.7 F (37.1 C)-99.6 F (37.6 C)] 99.6 F (37.6 C) (11/30 1145) Pulse Rate:  [68-111] 76 (11/30 1200) Resp:  [19-34] 19 (11/30 1200) BP: (107-138)/(65-95) 117/84 (11/30 1200) SpO2:  [99 %-100 %] 100 % (11/30 1200) Arterial Line BP: (97-121)/(52-67) 114/63 (11/30 1200) FiO2 (%):  [30 %-40 %] 30 % (11/30 1150) Weight:  [215 lb 13.3 oz (97.9 kg)] 215 lb 13.3 oz (97.9 kg) (11/30 0430) Physical Exam  Constitutional: intubated/sedated. He appears well-developed and well-nourished. No distress.  HENT: oett in place Cardiovascular: Normal rate, regular rhythm and normal heart sounds. Exam reveals no gallop and no friction rub.  No murmur heard.  Pulmonary/Chest: bilateral rhonchi. He has no wheezes.  Abdominal: Soft. Bowel sounds are normal. He exhibits no  distension. There is no tenderness. Peg in place Ext: upper extremity anasarca bilaterally Skin: Skin is warm and dry. No rash noted. No erythema.     Lab Results Recent Labs    09/21/17 0431 09/22/17 0459  WBC 16.6* 12.6*  HGB 10.0* 9.9*  HCT 31.8* 31.3*  NA 144 143  K 4.0 4.0  CL 115* 114*  CO2 22 23  BUN 58* 65*  CREATININE 2.51* 2.59*   Liver Panel Recent Labs    09/21/17 0431 09/22/17 0459  PROT 6.2* 6.2*  ALBUMIN 2.2* 2.1*  AST 55* 30  ALT 19 16*  ALKPHOS 67 66  BILITOT 0.9 0.5    Microbiology: 11/28 sputum -MRSA 11/28 blood cx in 1 set MRSA 11/28 blood cx CoNS in 1 set 11/28 1 of 2 sets c.perfringens 11/30 blood cx pending Studies/Results: Dg Chest Port 1 View  Result Date: 09/22/2017 CLINICAL DATA:  Acute respiratory failure EXAM: PORTABLE CHEST 1 VIEW COMPARISON:  09/21/2017 FINDINGS: Endotracheal tube in good position.  Gastric tube in the stomach. COPD. Improvement in diffuse bilateral airspace disease since the prior study. Airspace disease most prominent in the left base. No significant effusion IMPRESSION: COPD. Diffuse bilateral airspace disease with interval improvement. Possible clearing edema. Electronically Signed   By: Franchot Gallo M.D.   On: 09/22/2017 09:18   Dg Chest [facility]  1 View  Result Date: 09/21/2017 CLINICAL DATA:  Pneumonia EXAM: PORTABLE CHEST 1 VIEW COMPARISON:  09/20/2017 FINDINGS: Extensive bilateral airspace disease. Mild improvement in bilateral airspace disease especially the right upper lobe. Bibasilar consolidation due to atelectasis. Negative for pleural effusion Endotracheal tube in good position. NG tube enters the stomach with the tip not visualized IMPRESSION: Diffuse bilateral airspace disease with interval improvement. Endotracheal tube remains in good position. Electronically Signed   By: Franchot Gallo M.D.   On: 09/21/2017 07:30     Assessment/Plan:  MRSA pneumonia with secondary bacteremia = continue on  vancomycin. Kidney function is no worse. If it worsens, would change to ceftaroline  - minimum of 2 wk of IV treatment for pneumonia with secondary bacteremia - recommend to repeat blood cx and check TTE to evaluate for any vegetation  Clostridial bacteremia = unclear where the source maybe from. Recommend to start metronidazole 560m IV Q 8hr. Repeat blood cx. Also please get abd/pelvis CT to see if any GI source. Exam is unrevealing  NSTEMI vs. Demand ischemia = currently being anticoagulated given trop peaked at 8.77. Has hx of ICM with EF 25-30%.   Leukocytosis = improving  Dr CLinus Salmonsavailable this weekend/Dr HJohnnye Simato see on monday  Jia Dottavio, CPinnaclehealth Community Campusfor Infectious Diseases Cell: [phone]: [phone]  09/22/2017, 3:30 PM

## 2017-09-22 NOTE — Progress Notes (Signed)
  Echocardiogram 2D Echocardiogram has been performed.  Curtis Keller 09/22/2017, 4:27 PM

## 2017-09-23 DIAGNOSIS — J15212 Pneumonia due to Methicillin resistant Staphylococcus aureus: Secondary | ICD-10-CM

## 2017-09-23 DIAGNOSIS — B967 Clostridium perfringens [C. perfringens] as the cause of diseases classified elsewhere: Secondary | ICD-10-CM

## 2017-09-23 DIAGNOSIS — Z9911 Dependence on respirator [ventilator] status: Secondary | ICD-10-CM

## 2017-09-23 DIAGNOSIS — Z5181 Encounter for therapeutic drug level monitoring: Secondary | ICD-10-CM

## 2017-09-23 DIAGNOSIS — D72829 Elevated white blood cell count, unspecified: Secondary | ICD-10-CM

## 2017-09-23 LAB — BASIC METABOLIC PANEL
Anion gap: 6 (ref 5–15)
BUN: 67 mg/dL — AB (ref 6–20)
CALCIUM: 8.5 mg/dL — AB (ref 8.9–10.3)
CO2: 23 mmol/L (ref 22–32)
CREATININE: 2.32 mg/dL — AB (ref 0.61–1.24)
Chloride: 113 mmol/L — ABNORMAL HIGH (ref 101–111)
GFR calc non Af Amer: 28 mL/min — ABNORMAL LOW (ref >60)
GFR, EST AFRICAN AMERICAN: 33 mL/min — AB (ref >60)
Glucose, Bld: 127 mg/dL — ABNORMAL HIGH (ref 65–99)
Potassium: 4 mmol/L (ref 3.5–5.1)
SODIUM: 142 mmol/L (ref 135–145)

## 2017-09-23 LAB — GLUCOSE, CAPILLARY
GLUCOSE-CAPILLARY: 117 mg/dL — AB (ref 65–99)
GLUCOSE-CAPILLARY: 118 mg/dL — AB (ref 65–99)
GLUCOSE-CAPILLARY: 93 mg/dL (ref 65–99)
GLUCOSE-CAPILLARY: 98 mg/dL (ref 65–99)
Glucose-Capillary: 105 mg/dL — ABNORMAL HIGH (ref 65–99)
Glucose-Capillary: 108 mg/dL — ABNORMAL HIGH (ref 65–99)

## 2017-09-23 LAB — CBC
HCT: 29.7 % — ABNORMAL LOW (ref 39.0–52.0)
Hemoglobin: 9.3 g/dL — ABNORMAL LOW (ref 13.0–17.0)
MCH: 31.2 pg (ref 26.0–34.0)
MCHC: 31.3 g/dL (ref 30.0–36.0)
MCV: 99.7 fL (ref 78.0–100.0)
PLATELETS: 174 10*3/uL (ref 150–400)
RBC: 2.98 MIL/uL — ABNORMAL LOW (ref 4.22–5.81)
RDW: 14.9 % (ref 11.5–15.5)
WBC: 12 10*3/uL — AB (ref 4.0–10.5)

## 2017-09-23 LAB — HEPARIN LEVEL (UNFRACTIONATED): HEPARIN UNFRACTIONATED: 0.35 [IU]/mL (ref 0.30–0.70)

## 2017-09-23 LAB — TRIGLYCERIDES: TRIGLYCERIDES: 79 mg/dL (ref <150)

## 2017-09-23 LAB — HCV RNA QUANT: HCV Quantitative: NOT DETECTED IU/mL (ref >50)

## 2017-09-23 MED ORDER — FUROSEMIDE 10 MG/ML IJ SOLN
40.0000 mg | Freq: Once | INTRAMUSCULAR | Status: AC
  Administered 2017-09-23: 40 mg via INTRAVENOUS
  Filled 2017-09-23: qty 4

## 2017-09-23 NOTE — Progress Notes (Signed)
[facility]   Reason for visit: Follow up on MRSA bacteremia with pneumonia  Interval History: repeat blood cultures ngtd, Clostridia and Staph aureus in 1/2 blood cultures, respiratory culture with MRSA;    Physical Exam: Constitutional:  Vitals:   09/23/17 1224 09/23/17 1300  BP:  109/74  Pulse:  80  Resp:  16  Temp: 98.4 F (36.9 C)   SpO2:  100%   patient is intubated, sedated Eyes: anicteric HENT: +ET Respiratory: respiratory effort on vent Cardiovascular: RRR GI: soft, nt, nd  Review of Systems: Unable to be assessed due to patient factors  Lab Results  Component Value Date   WBC 12.0 (H) 09/23/2017   HGB 9.3 (L) 09/23/2017   HCT 29.7 (L) 09/23/2017   MCV 99.7 09/23/2017   PLT 174 09/23/2017    Lab Results  Component Value Date   CREATININE 2.32 (H) 09/23/2017   BUN 67 (H) 09/23/2017   NA 142 09/23/2017   K 4.0 09/23/2017   CL 113 (H) 09/23/2017   CO2 23 09/23/2017    Lab Results  Component Value Date   ALT 16 (L) 09/22/2017   AST 30 09/22/2017   ALKPHOS 66 09/22/2017     Microbiology: Recent Results (from the past 240 hour(s))  Culture, blood (routine x 2)     Status: Abnormal   Collection Time: 09/20/17  3:30 AM  Result Value Ref Range Status   Specimen Description BLOOD RIGHT ANTECUBITAL  Final   Special Requests IN PEDIATRIC BOTTLE Blood Culture adequate volume  Final   Culture  Setup Time   Final    GRAM POSITIVE COCCI IN CLUSTERS IN PEDIATRIC BOTTLE CRITICAL VALUE NOTED.  VALUE IS CONSISTENT WITH PREVIOUSLY REPORTED AND CALLED VALUE.    Culture (A)  Final    STAPHYLOCOCCUS SPECIES (COAGULASE NEGATIVE) THE SIGNIFICANCE OF ISOLATING THIS ORGANISM FROM A SINGLE SET OF BLOOD CULTURES WHEN MULTIPLE SETS ARE DRAWN IS UNCERTAIN. PLEASE NOTIFY THE MICROBIOLOGY DEPARTMENT WITHIN ONE WEEK IF SPECIATION AND SENSITIVITIES ARE REQUIRED.    Report Status 09/22/2017 FINAL  Final  Culture, blood (routine x 2)     Status:  Abnormal   Collection Time: 09/20/17  3:40 AM  Result Value Ref Range Status   Specimen Description BLOOD LEFT HAND  Final   Special Requests   Final    BOTTLES DRAWN AEROBIC AND ANAEROBIC Blood Culture adequate volume   Culture  Setup Time   Final    GRAM POSITIVE RODS ANAEROBIC BOTTLE ONLY CRITICAL RESULT CALLED TO, READ BACK BY AND VERIFIED WITH: AMEYER,PHARMD @1830  09/20/17 BY LHOWARD GRAM POSITIVE COCCI IN CLUSTERS AEROBIC BOTTLE ONLY CRITICAL RESULT CALLED TO, READ BACK BY AND VERIFIED WITH: AMEYER.PHARMD @2100  09/20/17 BY LHOWARD    Culture (A)  Final    CLOSTRIDIUM PERFRINGENS METHICILLIN RESISTANT STAPHYLOCOCCUS AUREUS    Report Status 09/22/2017 FINAL  Final   Organism ID, Bacteria METHICILLIN RESISTANT STAPHYLOCOCCUS AUREUS  Final      Susceptibility   Methicillin resistant staphylococcus aureus - MIC*    CIPROFLOXACIN >=8 RESISTANT Resistant     ERYTHROMYCIN >=8 RESISTANT Resistant     GENTAMICIN <=0.5 SENSITIVE Sensitive     OXACILLIN >=4 RESISTANT Resistant     TETRACYCLINE <=1 SENSITIVE Sensitive     VANCOMYCIN 1 SENSITIVE Sensitive     TRIMETH/SULFA <=10 SENSITIVE Sensitive     CLINDAMYCIN RESISTANT Resistant     RIFAMPIN <=0.5 SENSITIVE Sensitive     Inducible Clindamycin POSITIVE Resistant     *  METHICILLIN RESISTANT STAPHYLOCOCCUS AUREUS  Blood Culture ID Panel (Reflexed)     Status: None   Collection Time: 09/20/17  3:40 AM  Result Value Ref Range Status   Enterococcus species NOT DETECTED NOT DETECTED Final   Listeria monocytogenes NOT DETECTED NOT DETECTED Final   Staphylococcus species NOT DETECTED NOT DETECTED Final   Staphylococcus aureus NOT DETECTED NOT DETECTED Final   Streptococcus species NOT DETECTED NOT DETECTED Final   Streptococcus agalactiae NOT DETECTED NOT DETECTED Final   Streptococcus pneumoniae NOT DETECTED NOT DETECTED Final   Streptococcus pyogenes NOT DETECTED NOT DETECTED Final   Acinetobacter baumannii NOT DETECTED NOT DETECTED  Final   Enterobacteriaceae species NOT DETECTED NOT DETECTED Final   Enterobacter cloacae complex NOT DETECTED NOT DETECTED Final   Escherichia coli NOT DETECTED NOT DETECTED Final   Klebsiella oxytoca NOT DETECTED NOT DETECTED Final   Klebsiella pneumoniae NOT DETECTED NOT DETECTED Final   Proteus species NOT DETECTED NOT DETECTED Final   Serratia marcescens NOT DETECTED NOT DETECTED Final   Haemophilus influenzae NOT DETECTED NOT DETECTED Final   Neisseria meningitidis NOT DETECTED NOT DETECTED Final   Pseudomonas aeruginosa NOT DETECTED NOT DETECTED Final   Candida albicans NOT DETECTED NOT DETECTED Final   Candida glabrata NOT DETECTED NOT DETECTED Final   Candida krusei NOT DETECTED NOT DETECTED Final   Candida parapsilosis NOT DETECTED NOT DETECTED Final   Candida tropicalis NOT DETECTED NOT DETECTED Final  Blood Culture ID Panel (Reflexed)     Status: Abnormal   Collection Time: 09/20/17  3:40 AM  Result Value Ref Range Status   Enterococcus species NOT DETECTED NOT DETECTED Final   Listeria monocytogenes NOT DETECTED NOT DETECTED Final   Staphylococcus species DETECTED (A) NOT DETECTED Final    Comment: CRITICAL RESULT CALLED TO, READ BACK BY AND VERIFIED WITH: AMEYER,PHARMD @2100  09/20/17 BY LHOWARD    Staphylococcus aureus DETECTED (A) NOT DETECTED Final    Comment: Methicillin (oxacillin)-resistant Staphylococcus aureus (MRSA). MRSA is predictably resistant to beta-lactam antibiotics (except ceftaroline). Preferred therapy is vancomycin unless clinically contraindicated. Patient requires contact precautions if  hospitalized. CRITICAL RESULT CALLED TO, READ BACK BY AND VERIFIED WITH: AMEYER,PHARMD @2100  09/20/17 BY LHOWARD    Methicillin resistance DETECTED (A) NOT DETECTED Final    Comment: CRITICAL RESULT CALLED TO, READ BACK BY AND VERIFIED WITH: AMEYER,PHARMD @2100  09/20/17    Streptococcus species NOT DETECTED NOT DETECTED Final   Streptococcus agalactiae NOT  DETECTED NOT DETECTED Final   Streptococcus pneumoniae NOT DETECTED NOT DETECTED Final   Streptococcus pyogenes NOT DETECTED NOT DETECTED Final   Acinetobacter baumannii NOT DETECTED NOT DETECTED Final   Enterobacteriaceae species NOT DETECTED NOT DETECTED Final   Enterobacter cloacae complex NOT DETECTED NOT DETECTED Final   Escherichia coli NOT DETECTED NOT DETECTED Final   Klebsiella oxytoca NOT DETECTED NOT DETECTED Final   Klebsiella pneumoniae NOT DETECTED NOT DETECTED Final   Proteus species NOT DETECTED NOT DETECTED Final   Serratia marcescens NOT DETECTED NOT DETECTED Final   Haemophilus influenzae NOT DETECTED NOT DETECTED Final   Neisseria meningitidis NOT DETECTED NOT DETECTED Final   Pseudomonas aeruginosa NOT DETECTED NOT DETECTED Final   Candida albicans NOT DETECTED NOT DETECTED Final   Candida glabrata NOT DETECTED NOT DETECTED Final   Candida krusei NOT DETECTED NOT DETECTED Final   Candida parapsilosis NOT DETECTED NOT DETECTED Final   Candida tropicalis NOT DETECTED NOT DETECTED Final  Culture, respiratory (NON-Expectorated)     Status: None   Collection  Time: 09/20/17  8:20 AM  Result Value Ref Range Status   Specimen Description TRACHEAL ASPIRATE  Final   Special Requests NONE  Final   Gram Stain   Final    ABUNDANT WBC PRESENT, PREDOMINANTLY PMN RARE SQUAMOUS EPITHELIAL CELLS PRESENT MODERATE GRAM POSITIVE COCCI IN PAIRS    Culture   Final    MODERATE METHICILLIN RESISTANT STAPHYLOCOCCUS AUREUS   Report Status 09/22/2017 FINAL  Final   Organism ID, Bacteria METHICILLIN RESISTANT STAPHYLOCOCCUS AUREUS  Final      Susceptibility   Methicillin resistant staphylococcus aureus - MIC*    CIPROFLOXACIN >=8 RESISTANT Resistant     ERYTHROMYCIN >=8 RESISTANT Resistant     GENTAMICIN <=0.5 SENSITIVE Sensitive     OXACILLIN >=4 RESISTANT Resistant     TETRACYCLINE <=1 SENSITIVE Sensitive     VANCOMYCIN <=0.5 SENSITIVE Sensitive     TRIMETH/SULFA <=10 SENSITIVE  Sensitive     CLINDAMYCIN RESISTANT Resistant     RIFAMPIN <=0.5 SENSITIVE Sensitive     Inducible Clindamycin POSITIVE Resistant     * MODERATE METHICILLIN RESISTANT STAPHYLOCOCCUS AUREUS  MRSA PCR Screening     Status: Abnormal   Collection Time: 09/20/17  9:16 AM  Result Value Ref Range Status   MRSA by PCR POSITIVE (A) NEGATIVE Final    Comment:        The GeneXpert MRSA Assay (FDA approved for NASAL specimens only), is one component of a comprehensive MRSA colonization surveillance program. It is not intended to diagnose MRSA infection nor to guide or monitor treatment for MRSA infections. RESULT CALLED TO, READ BACK BY AND VERIFIED WITH: Patsy Lager RN 11:20 09/20/17 ([email or web address])   Urine Culture     Status: None   Collection Time: 09/21/17 12:57 PM  Result Value Ref Range Status   Specimen Description URINE, CLEAN CATCH  Final   Special Requests NONE  Final   Culture NO GROWTH  Final   Report Status 09/22/2017 FINAL  Final  Culture, blood (routine x 2)     Status: None (Preliminary result)   Collection Time: 09/22/17  8:20 AM  Result Value Ref Range Status   Specimen Description BLOOD RIGHT HAND  Final   Special Requests IN PEDIATRIC BOTTLE Blood Culture adequate volume  Final   Culture NO GROWTH 1 DAY  Final   Report Status PENDING  Incomplete  Culture, blood (routine x 2)     Status: None (Preliminary result)   Collection Time: 09/22/17  8:30 AM  Result Value Ref Range Status   Specimen Description BLOOD LEFT HAND  Final   Special Requests IN PEDIATRIC BOTTLE Blood Culture adequate volume  Final   Culture NO GROWTH 1 DAY  Final   Report Status PENDING  Incomplete    Impression/Plan:  1. Staph pneumonia - on vancomycin and will need two weeks.   2.  C perfringens - unclear source and has not regrown.  Will continue to monitor cultures  3.  Medication monitoring - creat stable on vancomycin today.  Will continue to monitor.  4.  Leukocytosis - stable. Will  continue to monitor.

## 2017-09-23 NOTE — Progress Notes (Signed)
ANTICOAGULATION CONSULT NOTE  Pharmacy Consult for heparin Indication: h/o apical thrombus  Labs: Recent Labs    09/21/17 0431 09/21/17 1257  09/22/17 0459 09/22/17 1300 09/22/17 2230 09/23/17 0400  HGB 10.0*  --   --  9.9*  --   --  9.3*  HCT 31.8*  --   --  31.3*  --   --  29.7*  PLT 170  --   --  153  --   --  174  LABPROT 23.6*  --   --  20.5*  --   --   --   INR 2.13  --   --  1.77  --   --   --   HEPARINUNFRC 0.25* <0.10*   < > 0.34 0.26* 0.36 0.35  CREATININE 2.51*  --   --  2.59*  --   --  2.32*  TROPONINI  --  8.70*  --  6.19*  --   --   --    < > = values in this interval not displayed.    Assessment:  61yo male admitted with SOB. Patient on warfarin PTA for h/o of apical mural thrombus. Pharmacy consulted to dose heparin while warfarin on hold.   Heparin level is therapeutic. hgb slightly down to 9.3, pt stable, plts wnl.  Plan Continue heparin gtt at 2450 units/hr Daily heparin level and CBC Monitor for s/s bleeding    [name]  09/23/2017 10:12 AM

## 2017-09-23 NOTE — Progress Notes (Signed)
PULMONARY / CRITICAL CARE MEDICINE   Name: Curtis Keller MRN: [id number] DOB: February 20, 1956    ADMISSION DATE:  09/20/2017 CONSULTATION DATE:  10/20/2017  REFERRING MD:  ED  CHIEF COMPLAINT:  Shortness of breath  HISTORY OF PRESENT ILLNESS:   61 yr old with multiple comorbidity including COPD, CHF EF 20%, CAD, apical mural thrombus resides in a nursing home was having altered mental status for 2 days which was thought to be due to UTI. He was altered as per his sister for few days and this morning  he started having severe shortness of breath saturating in the 50s with minimal improvemnet to the 70s on CPAP so a decision was made to intubate in the ED which was very difficult as per the ED physician. Please refer to their note.   CXR was showing bilateral infiltrates and is receiving cefepime and vancomycin.    Subjective: Remains stable. CT abd, head shows no acute abnormality.  VITAL SIGNS: BP 112/82   Pulse 72   Temp 98.7 F (37.1 C) (Axillary)   Resp 16   Ht 6' (1.829 m)   Wt 220 lb 10.9 oz (100.1 kg)   SpO2 100%   BMI 29.93 kg/m   HEMODYNAMICS:  no pressors stable   VENTILATOR SETTINGS: Vent Mode: PRVC FiO2 (%):  [30 %] 30 % Set Rate:  [24 bmp] 24 bmp Vt Set:  [470 mL] 470 mL PEEP:  [5 cmH20-8 cmH20] 5 cmH20 Plateau Pressure:  [15 cmH20-19 cmH20] 15 cmH20  INTAKE / OUTPUT: I/O last 3 completed shifts: In: 3692.9 [I.V.:1289.9; Other:200; NG/GT:1703; IV Piggyback:500] Out: 1881 [Urine:1881]  PHYSICAL EXAMINATION: Gen:      No acute distress,  chronically ill appearing HEENT:  EOMI, sclera anicteric Neck:     No masses; no thyromegaly Lungs:    Scattered rhonchi; normal respiratory effort CV:         Regular rate and rhythm; no murmurs Abd:      + bowel sounds; soft, non-tender; no palpable masses, no distension, PEG tube in place Ext:    1-2+ edema; adequate peripheral perfusion Skin:      Warm and dry; no rash Neuro: Sedated,  unresponsive  LABS:  BMET Recent Labs  Lab 09/21/17 0431 09/22/17 0459 09/23/17 0400  NA 144 143 142  K 4.0 4.0 4.0  CL 115* 114* 113*  CO2 22 23 23   BUN 58* 65* 67*  CREATININE 2.51* 2.59* 2.32*  GLUCOSE 128* 133* 127*    Electrolytes Recent Labs  Lab 09/21/17 0431 09/21/17 2127 09/22/17 0459 09/23/17 0400  CALCIUM 8.6*  --  8.6* 8.5*  MG 1.8 2.0 2.0  --   PHOS 3.0 3.4 3.3  --     CBC Recent Labs  Lab 09/21/17 0431 09/22/17 0459 09/23/17 0400  WBC 16.6* 12.6* 12.0*  HGB 10.0* 9.9* 9.3*  HCT 31.8* 31.3* 29.7*  PLT 170 153 174    Coag's Recent Labs  Lab 09/20/17 0321 09/21/17 0431 09/22/17 0459  INR 1.77 2.13 1.77    Sepsis Markers Recent Labs  Lab 09/20/17 0604 09/21/17 1257 09/22/17 0459  LATICACIDVEN 4.63* 1.1 0.9  PROCALCITON  --  4.14  --     ABG Recent Labs  Lab 09/20/17 1238 09/21/17 0418 09/22/17 0343  PHART 7.364 7.395 7.372  PCO2ART 37.4 37.9 40.0  PO2ART 274* 92.2 114*    Liver Enzymes Recent Labs  Lab 09/21/17 0431 09/22/17 0459  AST 55* 30  ALT 19 16*  ALKPHOS 67  66  BILITOT 0.9 0.5  ALBUMIN 2.2* 2.1*    Cardiac Enzymes Recent Labs  Lab 09/20/17 0559 09/21/17 1257 09/22/17 0459  TROPONINI 3.77* 8.70* 6.19*    Glucose Recent Labs  Lab 09/22/17 0850 09/22/17 1141 09/22/17 1534 09/22/17 2142 09/22/17 2338 09/23/17 0329  GLUCAP 139* 117* 118* 97 108* 118*    Imaging Ct Abdomen Pelvis Wo Contrast  Result Date: 09/22/2017 CLINICAL DATA:  Patient with clostridium perfringens in blood culture. Looking for abdominal/pelvic source to explain. Uncertain as to history of diarrhea as patient presented with AMS and currently intubated. Fever of unknown origin. EXAM: CT ABDOMEN AND PELVIS WITHOUT CONTRAST TECHNIQUE: Multidetector CT imaging of the abdomen and pelvis was performed following the standard protocol without IV contrast. COMPARISON:  CT 04/07/2017 FINDINGS: Lower chest: Small bilateral pleural  effusions. Bilateral lower lobe airspace disease, left greater than right. On the left this is out of proportion of what expected for compressive atelectasis. Very artery calcifications or stents. Mild cardiomegaly. Hepatobiliary: No evidence of focal hepatic lesion allowing for lack contrast. Gallbladder physiologically distended with layering stones or sludge. No pericholecystic inflammation. No biliary dilatation. Pancreas: No ductal dilatation or inflammation. Spleen: Post splenectomy. Soft tissue deposit in the left upper quadrant adjacent to surgical clips consistent with splenosis. Adrenals/Urinary Tract: No adrenal nodule. No hydronephrosis. Mild bilateral perinephric edema. Foley catheter decompresses the urinary bladder. Stomach/Bowel: Enteric tube with tip in the first portion of the duodenum. Gastrostomy tube balloon in the stomach. The stomach is decompressed. There is no bowel wall thickening or inflammatory change. No liquid stool. Normal appendix tentatively identified. No evidence of bowel inflammation. Vascular/Lymphatic: Moderate to advanced aortic atherosclerosis. No aneurysm. No bulky adenopathy. Reproductive: Prostate is unremarkable. Other: Streak artifact through the pelvis from left hip arthroplasty limits pelvic evaluation. No intra-abdominal ascites. No free air or abscess. Rectus abdominus diastasis of fat herniation. Musculoskeletal: Left hip arthroplasty. Degenerative change in the spine. No acute osseous abnormality. IMPRESSION: 1. No localizing intra-abdominal/pelvic source of infection or fever. No bowel inflammation. 2. Small bilateral pleural effusions with lower lobe opacities in the lung bases. On the left this is greater than expected for atelectasis, pneumonia or aspiration is favored. On the right this may be atelectasis, pneumonia, or aspiration. 3.  Aortic Atherosclerosis (ICD10-I70.0). Electronically Signed   By: Rubye Oaks M.D.   On: 09/22/2017 23:12   Ct Head Wo  Contrast  Result Date: 09/22/2017 CLINICAL DATA:  61 y/o M; severe sepsis, acute respiratory failure, pneumonia, altered level of consciousness. EXAM: CT HEAD WITHOUT CONTRAST TECHNIQUE: Contiguous axial images were obtained from the base of the skull through the vertex without intravenous contrast. COMPARISON:  10/20/2017 CT of the head. FINDINGS: Brain: No evidence of acute infarction, hemorrhage, hydrocephalus, extra-axial collection or mass lesion/mass effect. Multiple chronic infarcts are present within bilateral cerebellar hemispheres, right occipital lobe, left anterior temporal lobe, pons, and bilateral basal ganglia without interval change. Stable chronic microvascular ischemic changes and parenchymal volume loss of the brain. Vascular: Calcific atherosclerosis of carotid siphons. No hyperdense vessel. Skull: Normal. Negative for fracture or focal lesion. Sinuses/Orbits: Mild diffuse paranasal sinus mucosal thickening, maxillary sinus mucous retention cyst, and small fluid level in the sphenoid sinus. Other: None. IMPRESSION: 1. No acute intracranial abnormality identified. 2. Stable chronic microvascular ischemic changes, multiple chronic infarctions, and brain parenchymal volume loss. Electronically Signed   By: Mitzi Hansen M.D.   On: 09/22/2017 21:43   Culture data Sputum culture: 11/28>>> MRSA Blood culture 11/28>>> MRSA, C  perfringens  ANTIBIOTICS: Zosyn 11/28>> 11/29 Vanc 11/28>> Flagyl 11/30>>  ASSESSMENT / PLAN: 61 year old nursing home resident with multiple medical issues admitted with MRSA and Clostridium bacteremia, HCAP  Acute hypoxemic respiratory failure in the setting of diffuse pulmonary infiltrates .   MRSA  pneumonia with ALI  Plan Wean vent as tolerated by mental status Continue antibiotics for MRSA pneumonia Follow CXR, ABG  SIRS sepsis-septic shock-->volume responsive w/ Clostridium Perfringens and MRSA bacteremia  Systolic HFrEF (20%). Repeat echo  shows unchaged EF Elevated troponin, suspect demand ischemia he is not a candidate for intervention; this has trended down  Plan abx per ID (day # 4/x vanc; #2 flagyl) Start diuresis Tele montioring  History of left ventricular thrombus, on chronic warfarin therapy Plan Continue heparin drip  Acute on chronic kidney disease; it appears as though his baseline creatinine ranges from 1.7 to around 2.4; presents with creatinine of 2.66 on admission (stable) Mild hyperchloremia  Plan Follow lytes  Metabolic encephalopathy; with history of stroke and left-sided hemiplegia since June 2018 CT head was completed there was no acute abnormality Plan Minimize sedation. Follow   History of dysphasia; has chronic PEG, Plan Cont tube feeds  History of diabetes-->stable  Plan SSI coverage   Chronic anemia without evidence of bleeding Plan Follow CBC  History of hep C Plan Continue universal precautions   Resolved issues  Lactic acidosis  FAMILY  - Updates: sister bedside is a nurse and updated daily  The patient is critically ill with multiple organ system failure and requires high complexity decision making for assessment and support, frequent evaluation and titration of therapies, advanced monitoring, review of radiographic studies and interpretation of complex data.   Critical Care Time devoted to patient care services, exclusive of separately billable procedures, described in this note is 35 minutes.   Chilton GreathousePraveen Oletta Buehring MD [facility] Pager [phone] If no answer or after 3pm call: [phone] 09/23/2017, 7:13 AM

## 2017-09-24 ENCOUNTER — Inpatient Hospital Stay (HOSPITAL_COMMUNITY): Payer: Medicare Other

## 2017-09-24 LAB — CBC
HEMATOCRIT: 30.6 % — AB (ref 39.0–52.0)
HEMOGLOBIN: 9.7 g/dL — AB (ref 13.0–17.0)
MCH: 31.5 pg (ref 26.0–34.0)
MCHC: 31.7 g/dL (ref 30.0–36.0)
MCV: 99.4 fL (ref 78.0–100.0)
Platelets: 169 10*3/uL (ref 150–400)
RBC: 3.08 MIL/uL — ABNORMAL LOW (ref 4.22–5.81)
RDW: 14.8 % (ref 11.5–15.5)
WBC: 8.9 10*3/uL (ref 4.0–10.5)

## 2017-09-24 LAB — GLUCOSE, CAPILLARY
GLUCOSE-CAPILLARY: 100 mg/dL — AB (ref 65–99)
GLUCOSE-CAPILLARY: 101 mg/dL — AB (ref 65–99)
GLUCOSE-CAPILLARY: 113 mg/dL — AB (ref 65–99)
Glucose-Capillary: 110 mg/dL — ABNORMAL HIGH (ref 65–99)
Glucose-Capillary: 115 mg/dL — ABNORMAL HIGH (ref 65–99)
Glucose-Capillary: 105 mg/dL — ABNORMAL HIGH (ref 65–99)

## 2017-09-24 LAB — HEPARIN LEVEL (UNFRACTIONATED)
HEPARIN UNFRACTIONATED: 0.52 [IU]/mL (ref 0.30–0.70)
Heparin Unfractionated: 0.28 IU/mL — ABNORMAL LOW (ref 0.30–0.70)
Heparin Unfractionated: 0.25 IU/mL — ABNORMAL LOW (ref 0.30–0.70)

## 2017-09-24 LAB — BASIC METABOLIC PANEL
ANION GAP: 8 (ref 5–15)
BUN: 72 mg/dL — AB (ref 6–20)
CHLORIDE: 111 mmol/L (ref 101–111)
CO2: 23 mmol/L (ref 22–32)
Calcium: 8.6 mg/dL — ABNORMAL LOW (ref 8.9–10.3)
Creatinine, Ser: 2.09 mg/dL — ABNORMAL HIGH (ref 0.61–1.24)
GFR calc non Af Amer: 32 mL/min — ABNORMAL LOW (ref >60)
GFR, EST AFRICAN AMERICAN: 37 mL/min — AB (ref >60)
Glucose, Bld: 103 mg/dL — ABNORMAL HIGH (ref 65–99)
POTASSIUM: 3.8 mmol/L (ref 3.5–5.1)
SODIUM: 142 mmol/L (ref 135–145)

## 2017-09-24 LAB — PHOSPHORUS: PHOSPHORUS: 3.7 mg/dL (ref 2.5–4.6)

## 2017-09-24 LAB — MAGNESIUM: MAGNESIUM: 1.9 mg/dL (ref 1.7–2.4)

## 2017-09-24 LAB — TROPONIN I: TROPONIN I: 2.69 ng/mL — AB (ref <0.03)

## 2017-09-24 MED ORDER — FUROSEMIDE 10 MG/ML IJ SOLN
40.0000 mg | Freq: Two times a day (BID) | INTRAMUSCULAR | Status: DC
  Administered 2017-09-24 – 2017-09-25 (×3): 40 mg via INTRAVENOUS
  Filled 2017-09-24 (×2): qty 4

## 2017-09-24 MED ORDER — POTASSIUM CHLORIDE 20 MEQ/15ML (10%) PO SOLN
40.0000 meq | Freq: Two times a day (BID) | ORAL | Status: AC
  Administered 2017-09-24 (×2): 40 meq
  Filled 2017-09-24 (×3): qty 30

## 2017-09-24 MED ORDER — MAGNESIUM SULFATE 2 GM/50ML IV SOLN
2.0000 g | Freq: Once | INTRAVENOUS | Status: AC
  Administered 2017-09-24: 2 g via INTRAVENOUS
  Filled 2017-09-24: qty 50

## 2017-09-24 NOTE — Progress Notes (Signed)
ANTICOAGULATION CONSULT NOTE  Pharmacy Consult for heparin Indication: h/o apical thrombus  Labs: Recent Labs    09/22/17 0459  09/23/17 0400 09/24/17 0408 09/24/17 1344  HGB 9.9*  --  9.3* 9.7*  --   HCT 31.3*  --  29.7* 30.6*  --   PLT 153  --  174 169  --   LABPROT 20.5*  --   --   --   --   INR 1.77  --   --   --   --   HEPARINUNFRC 0.34   < > 0.35 0.28* 0.25*  CREATININE 2.59*  --  2.32* 2.09*  --   TROPONINI 6.19*  --   --  2.69*  --    < > = values in this interval not displayed.    Assessment:  61yo male admitted with SOB. Patient on warfarin PTA for h/o of apical mural thrombus. Pharmacy consulted to dose heparin while warfarin on hold.   Heparin level is subtherapeutic, after several days of being therapeutic. Slow decline in hgb from admission 12.2 > 9.7, plts wnl.  Plan Increase heparin to 2800 units/hr Daily heparin level and CBC Monitor for s/sx bleeding    Curtis Keller  09/24/2017 3:02 PM

## 2017-09-24 NOTE — Progress Notes (Signed)
ANTICOAGULATION CONSULT NOTE  Pharmacy Consult for heparin Indication: h/o apical thrombus  Labs: Recent Labs    09/22/17 0459  09/23/17 0400 09/24/17 0408 09/24/17 1344 09/24/17 2100  HGB 9.9*  --  9.3* 9.7*  --   --   HCT 31.3*  --  29.7* 30.6*  --   --   PLT 153  --  174 169  --   --   LABPROT 20.5*  --   --   --   --   --   INR 1.77  --   --   --   --   --   HEPARINUNFRC 0.34   < > 0.35 0.28* 0.25* 0.52  CREATININE 2.59*  --  2.32* 2.09*  --   --   TROPONINI 6.19*  --   --  2.69*  --   --    < > = values in this interval not displayed.    Assessment:  61yo male admitted with SOB. Patient on warfarin PTA for h/o of apical mural thrombus. Pharmacy consulted to dose heparin while warfarin on hold.   Heparin level is therapeutic after rate increase earlier today.   Plan Continue heparin infusion at 2800 units/hr AM heparin level to serve as confirmatory   Pollyann Samples, PharmD, BCPS 09/24/2017, 10:36 PM

## 2017-09-24 NOTE — Progress Notes (Signed)
PULMONARY / CRITICAL CARE MEDICINE   Name: Curtis Keller MRN: [id number] DOB: June 18, 1956    ADMISSION DATE:  09/20/2017 CONSULTATION DATE:  10/20/2017  REFERRING MD:  ED  CHIEF COMPLAINT:  Shortness of breath  HISTORY OF PRESENT ILLNESS:   61 yr old with multiple comorbidity including COPD, CHF EF 20%, CAD, apical mural thrombus resides in a nursing home was having altered mental status for 2 days which was thought to be due to UTI. He was altered as per his sister for few days and this morning  he started having severe shortness of breath saturating in the 50s with minimal improvemnet to the 70s on CPAP so a decision was made to intubate in the ED which was very difficult as per the ED physician. Please refer to their note.   CXR was showing bilateral infiltrates and is receiving cefepime and vancomycin.    Subjective: More awake today. Hemodynamically stable  VITAL SIGNS: BP 112/78   Pulse 79   Temp 99.2 F (37.3 C) (Oral)   Resp 15   Ht 6' (1.829 m)   Wt 217 lb 9.5 oz (98.7 kg)   SpO2 97%   BMI 29.51 kg/m   HEMODYNAMICS:  no pressors stable   VENTILATOR SETTINGS: Vent Mode: PRVC FiO2 (%):  [30 %] 30 % Set Rate:  [24 bmp] 24 bmp Vt Set:  [470 mL] 470 mL PEEP:  [5 cmH20] 5 cmH20 Pressure Support:  [10 cmH20] 10 cmH20 Plateau Pressure:  [14 cmH20-17 cmH20] 17 cmH20  INTAKE / OUTPUT: I/O last 3 completed shifts: In: 3830 [I.V.:1387; WU/XL:2440G/GT:1943; IV Piggyback:500] Out: 3725 [Urine:3725]  PHYSICAL EXAMINATION: Gen:      No acute distress, chronically ill appearing HEENT:  EOMI, sclera anicteric, ETT in place Neck:     No masses; no thyromegaly Lungs:    Clear to auscultation bilaterally; normal respiratory effort CV:         Regular rate and rhythm; no murmurs Abd:      + bowel sounds; soft, non-tender; no palpable masses, no distension Ext:    1-2+ edema; adequate peripheral perfusion Skin:      Warm and dry; no rash Neuro: Sedated, awaken to command and nods  to questions  LABS:  BMET Recent Labs  Lab 09/22/17 0459 09/23/17 0400 09/24/17 0408  NA 143 142 142  K 4.0 4.0 3.8  CL 114* 113* 111  CO2 23 23 23   BUN 65* 67* 72*  CREATININE 2.59* 2.32* 2.09*  GLUCOSE 133* 127* 103*    Electrolytes Recent Labs  Lab 09/21/17 2127 09/22/17 0459 09/23/17 0400 09/24/17 0408  CALCIUM  --  8.6* 8.5* 8.6*  MG 2.0 2.0  --  1.9  PHOS 3.4 3.3  --  3.7    CBC Recent Labs  Lab 09/22/17 0459 09/23/17 0400 09/24/17 0408  WBC 12.6* 12.0* 8.9  HGB 9.9* 9.3* 9.7*  HCT 31.3* 29.7* 30.6*  PLT 153 174 169    Coag's Recent Labs  Lab 09/20/17 0321 09/21/17 0431 09/22/17 0459  INR 1.77 2.13 1.77    Sepsis Markers Recent Labs  Lab 09/20/17 0604 09/21/17 1257 09/22/17 0459  LATICACIDVEN 4.63* 1.1 0.9  PROCALCITON  --  4.14  --     ABG Recent Labs  Lab 09/20/17 1238 09/21/17 0418 09/22/17 0343  PHART 7.364 7.395 7.372  PCO2ART 37.4 37.9 40.0  PO2ART 274* 92.2 114*    Liver Enzymes Recent Labs  Lab 09/21/17 0431 09/22/17 0459  AST 55* 30  ALT 19 16*  ALKPHOS 67 66  BILITOT 0.9 0.5  ALBUMIN 2.2* 2.1*    Cardiac Enzymes Recent Labs  Lab 09/21/17 1257 09/22/17 0459 09/24/17 0408  TROPONINI 8.70* 6.19* 2.69*    Glucose Recent Labs  Lab 09/23/17 0855 09/23/17 1226 09/23/17 1650 09/23/17 1935 09/23/17 2330 09/24/17 0329  GLUCAP 105* 117* 108* 93 98 100*    Imaging No results found. Culture data Sputum culture: 11/28>>> MRSA Blood culture 11/28>>> MRSA, C perfringens  ANTIBIOTICS: Zosyn 11/28>> 11/29 Vanc 11/28>> Flagyl 11/30>>  ASSESSMENT / PLAN: 61 year old nursing home resident with multiple medical issues admitted with MRSA and Clostridium bacteremia, HCAP  Acute hypoxemic respiratory failure in the setting of diffuse pulmonary infiltrates .   MRSA  pneumonia with ALI  Plan Continue pressure support weans Continue abx for HCAP Follow CXR  SIRS sepsis-septic shock-->volume responsive  w/ Clostridium Perfringens and MRSA bacteremia  Systolic HFrEF (20%). Repeat echo shows unchaged EF Elevated troponin, suspect demand ischemia he is not a candidate for intervention; this has trended down  Plan abx per ID (day # 5/x vanc; #3 flagyl) Start diuresis. Lasix 40 mg IV bid Tele monitoring  History of left ventricular thrombus, on chronic warfarin therapy Plan Continue heparin drip  Acute on chronic kidney disease; Improving Mild hyperchloremia  Plan Follow lytes, urine output and cr  Metabolic encephalopathy; with history of stroke and left-sided hemiplegia since June 2018 CT head was completed there was no acute abnormality Plan Minimize sedation.   History of dysphasia; has chronic PEG History of hep C Plan Continue tube feeds  History of diabetes-->stable  Plan SSI coverage  Chronic anemia without evidence of bleeding Plan Follow CBC  FAMILY  - Updates: sister bedside is a nurse and updated daily  The patient is critically ill with multiple organ system failure and requires high complexity decision making for assessment and support, frequent evaluation and titration of therapies, advanced monitoring, review of radiographic studies and interpretation of complex data.   Critical Care Time devoted to patient care services, exclusive of separately billable procedures, described in this note is 35 minutes.   Chilton Greathouse MD [facility] Pager [phone] If no answer or after 3pm call: [phone] 09/24/2017, 7:31 AM

## 2017-09-24 NOTE — Progress Notes (Signed)
ANTICOAGULATION CONSULT NOTE - Follow Up Consult  Pharmacy Consult for heparin Indication: h/o apical thrombus  Labs: Recent Labs    09/21/17 1257  09/22/17 0459  09/22/17 2230 09/23/17 0400 09/24/17 0408  HGB  --    < > 9.9*  --   --  9.3* 9.7*  HCT  --   --  31.3*  --   --  29.7* 30.6*  PLT  --   --  153  --   --  174 169  LABPROT  --   --  20.5*  --   --   --   --   INR  --   --  1.77  --   --   --   --   HEPARINUNFRC <0.10*   < > 0.34   < > 0.36 0.35 0.28*  CREATININE  --   --  2.59*  --   --  2.32*  --   TROPONINI 8.70*  --  6.19*  --   --   --   --    < > = values in this interval not displayed.    Assessment: 61yo male now subtherapeutic on heparin after two levels at low end of goal; no gtt issues or signs of bleeding per RN.  Goal of Therapy:  Heparin level 0.3-0.7 units/ml   Plan:  Will increase heparin gtt by ~5% to 2600 units/hr and check level in 8hr.  Vernard Gambles, PharmD, BCPS  09/24/2017,5:18 AM

## 2017-09-25 ENCOUNTER — Inpatient Hospital Stay (HOSPITAL_COMMUNITY): Payer: Medicare Other

## 2017-09-25 DIAGNOSIS — R7881 Bacteremia: Secondary | ICD-10-CM | POA: Diagnosis present

## 2017-09-25 LAB — BASIC METABOLIC PANEL
ANION GAP: 7 (ref 5–15)
BUN: 77 mg/dL — ABNORMAL HIGH (ref 6–20)
CALCIUM: 9 mg/dL (ref 8.9–10.3)
CO2: 25 mmol/L (ref 22–32)
CREATININE: 2.08 mg/dL — AB (ref 0.61–1.24)
Chloride: 112 mmol/L — ABNORMAL HIGH (ref 101–111)
GFR, EST AFRICAN AMERICAN: 38 mL/min — AB (ref >60)
GFR, EST NON AFRICAN AMERICAN: 32 mL/min — AB (ref >60)
Glucose, Bld: 119 mg/dL — ABNORMAL HIGH (ref 65–99)
Potassium: 4.3 mmol/L (ref 3.5–5.1)
SODIUM: 144 mmol/L (ref 135–145)

## 2017-09-25 LAB — GLUCOSE, CAPILLARY
GLUCOSE-CAPILLARY: 113 mg/dL — AB (ref 65–99)
GLUCOSE-CAPILLARY: 100 mg/dL — AB (ref 65–99)
GLUCOSE-CAPILLARY: 119 mg/dL — AB (ref 65–99)
GLUCOSE-CAPILLARY: 103 mg/dL — AB (ref 65–99)
Glucose-Capillary: 107 mg/dL — ABNORMAL HIGH (ref 65–99)

## 2017-09-25 LAB — CBC
HCT: 32.1 % — ABNORMAL LOW (ref 39.0–52.0)
Hemoglobin: 10.2 g/dL — ABNORMAL LOW (ref 13.0–17.0)
MCH: 31.2 pg (ref 26.0–34.0)
MCHC: 31.8 g/dL (ref 30.0–36.0)
MCV: 98.2 fL (ref 78.0–100.0)
PLATELETS: 198 10*3/uL (ref 150–400)
RBC: 3.27 MIL/uL — AB (ref 4.22–5.81)
RDW: 14.5 % (ref 11.5–15.5)
WBC: 10.7 10*3/uL — AB (ref 4.0–10.5)

## 2017-09-25 LAB — PHOSPHORUS: PHOSPHORUS: 3.5 mg/dL (ref 2.5–4.6)

## 2017-09-25 LAB — HEPARIN LEVEL (UNFRACTIONATED): HEPARIN UNFRACTIONATED: 0.57 [IU]/mL (ref 0.30–0.70)

## 2017-09-25 LAB — MAGNESIUM: MAGNESIUM: 2.4 mg/dL (ref 1.7–2.4)

## 2017-09-25 MED ORDER — FUROSEMIDE 10 MG/ML IJ SOLN
40.0000 mg | Freq: Every day | INTRAMUSCULAR | Status: DC
  Administered 2017-09-26: 40 mg via INTRAVENOUS
  Filled 2017-09-25: qty 4

## 2017-09-25 MED ORDER — SENNOSIDES-DOCUSATE SODIUM 8.6-50 MG PO TABS
1.0000 | ORAL_TABLET | Freq: Every day | ORAL | Status: DC
  Administered 2017-09-25 – 2017-10-03 (×8): 1 via ORAL
  Filled 2017-09-25 (×10): qty 1

## 2017-09-25 NOTE — Progress Notes (Addendum)
ANTICOAGULATION CONSULT NOTE  Pharmacy Consult for heparin Indication: h/o apical thrombus  Labs: Recent Labs    09/23/17 0400 09/24/17 0408 09/24/17 1344 09/24/17 2100 09/25/17 0327 09/25/17 0328  HGB 9.3* 9.7*  --   --  10.2*  --   HCT 29.7* 30.6*  --   --  32.1*  --   PLT 174 169  --   --  198  --   HEPARINUNFRC 0.35 0.28* 0.25* 0.52  --  0.57  CREATININE 2.32* 2.09*  --   --  2.08*  --   TROPONINI  --  2.69*  --   --   --   --     Assessment:  61yo male admitted with SOB. Patient on warfarin PTA for h/o of apical mural thrombus. Pharmacy consulted to dose heparin while warfarin on hold.   Heparin level is subtherapeutic, after several days of being therapeutic. Hgb improved to 10.2. Plt wnl. SCr down to 2.08   Plan Continue heparin to 2800 units/hr Daily heparin level and CBC Monitor for s/sx bleeding    Vinnie Level, PharmD., BCPS Clinical Pharmacist Pager [phone]  Addendum: Patient is also on D#5 of vancomycin and D#3 of Flagyl for MRSA and clostridium bacteremia. ID plans for 2 week course of both abx. WBC 10.7. Afebrile. SCr down to 2.08. Will recheck vanc trough tomorrow given improved renal fx.   Plan: Continue vancomycin 750 mg IV Q 24 hours Continue Flagyl 500 mg IV q 8 hours 0530 VT tomorrow  Vinnie Level, PharmD., BCPS Clinical Pharmacist Pager [phone]

## 2017-09-25 NOTE — Clinical Social Work Note (Signed)
Clinical Social Work Assessment  Patient Details  Name: Curtis Keller MRN: [id number] Date of Birth: 05/13/1956  Date of referral:  09/25/17               Reason for consult:  Facility Placement                Permission sought to share information with:  Facility Medical sales representative, Family Supports Permission granted to share information::  No  Name::     Programmer, systems::  SNFs  Relationship::  Sister  Contact Information:  [phone]  Housing/Transportation Living arrangements for the past 2 months:  Skilled Nursing Facility Source of Information:  Other (Comment Required) Patient Interpreter Needed:  None Criminal Activity/Legal Involvement Pertinent to Current Situation/Hospitalization:  No - Comment as needed Significant Relationships:  Siblings Lives with:  Facility Resident Do you feel safe going back to the place where you live?  Yes Need for family participation in patient care:  Yes (Comment)  Care giving concerns:  CSW received consult for possible SNF placement at time of discharge. CSW spoke with patient's sister regarding discharge plans. She stated that patient has been at [facility] since July. She reported that she was hoping to take patient home from the SNF, but then he had to come to hospital. She would like for patient to come home with her since she is a nurse and feels like she can handle him, however she understands that he is weaker than he has been. If patient requires SNF, she would like to explore whether there are any closer SNFs to her house in [place]. CSW to continue to follow and assist with discharge planning needs.   Social Worker assessment / plan:  CSW spoke with patient's sister concerning possibility of returning to rehab at [facility].   Employment status:  Disabled (Comment on whether or not currently receiving Disability) Insurance information:  Medicare, Medicaid In [place] PT Recommendations:  Not assessed at this time Information /  Referral to community resources:  Skilled Nursing Facility  Patient/Family's Response to care:  Patient's sister recognizes need for continued nursing stay but also would like to take the patient home with her. CSW confirmed with Pecola Lawless that patient has had a wellness period and can start over with Medicare. CSW sent referral to other facilities closer to patient's sister's home in [place] to see if anyone would be able to accept him.    Patient/Family's Understanding of and Emotional Response to Diagnosis, Current Treatment, and Prognosis:  Patient/family is realistic regarding therapy needs and expressed being hopeful for SNF placement. Patient's sister expressed understanding of CSW role and discharge process as well as medical condition. She appreciates CSW completing a search for closer facilities and understands that patient may return to [facility] if there are no closer facilities. No questions/concerns about plan or treatment.    Emotional Assessment Appearance:  Appears older than stated age Attitude/Demeanor/Rapport:  Unable to Assess Affect (typically observed):  Unable to Assess Orientation:  (Disoriented) Alcohol / Substance use:  Not Applicable Psych involvement (Current and /or in the community):  No (Comment)  Discharge Needs  Concerns to be addressed:  Care Coordination Readmission within the last 30 days:  No Current discharge risk:  Chronically ill, Cognitively Impaired Barriers to Discharge:  Continued Medical Work up   [employer], LCSWA 09/25/2017, 3:07 PM

## 2017-09-25 NOTE — Procedures (Signed)
Extubation Procedure Note  Patient Details:   Name: SERAFIM KIESEWETTER DOB: November 30, 1955 MRN: [id number]   Airway Documentation:  Airway 6.5 mm (Active)  Secured at (cm) 25 cm 09/25/2017  7:58 AM  Measured From Lips 09/25/2017  7:58 AM  Secured Location Center 09/25/2017  7:58 AM  Secured By [employer] 09/25/2017  7:58 AM  Tube Holder Repositioned Yes 09/25/2017  7:58 AM  Cuff Pressure (cm H2O) 22 cm H2O 09/25/2017  7:58 AM  Site Condition Other (Comment) 09/24/2017  4:00 PM   Positive cuff leak prior to extubation.  Evaluation  O2 sats: stable throughout Complications: No apparent complications Patient did tolerate procedure well. Bilateral Breath Sounds: Diminished, Clear   Yes   RN at bedside during extubation. No stridor or distress noted and VS stable throughout. Placed pt on 4L [place] post extubation.   Julieanne Manson 09/25/2017, 10:47 AM

## 2017-09-25 NOTE — Care Management Note (Addendum)
Case Management Note  Patient Details  Name: Curtis Keller MRN: [id number] Date of Birth: Jun 22, 1956  Subjective/Objective:    Pt admitted with SOB              Action/Plan:   PTA from [facility].  Pt has extensive hx including trach (decannulated), PEG (PTA PEG was not in use as pt was tolerating PO diet).  Pts sister is bed side and has requested that pt not return to [facility] -  Pt and sister would like CSW assistance with consideration for facilities closer to the [place] area.  Pt will need IV antibiotics for approximately 2 weeks - CSW informed.  CSW consulted and actively working on placement.     Expected Discharge Date:  09/27/17               Expected Discharge Plan:  Skilled Nursing Facility(From facility)  In-House Referral:  Clinical Social Work  Discharge planning Services  CM Consult  Post Acute Care Choice:    Choice offered to:     DME Arranged:    DME Agency:     HH Arranged:    HH Agency:     Status of Service:     If discussed at [employer], dates discussed:    Additional Comments: 09/29/2017 Pt received trach today post 2 failed extubations (pt has a hx of two previous trachs however was decannulated with each) . Plan is to begin weaning post trach.  CM will continue to follow for discharge needs to determine appropriate discharge venue.      Cherylann Parr, RN 09/25/2017, 3:07 PM

## 2017-09-25 NOTE — Consult Note (Addendum)
WOC Nurse wound consult note Reason for Consult: Consult requested for bilat heels and sacrum/buttocks. Bedisde nurse states pt had moisture-associated skin damage to bilat buttocks upon admission, but this has progressed to scattered areas of deep tissue injury to bilat buttocks and sacrum.  Affected area is approx 10X12cm, dark reddish-purple and dark red, areas beginning to peel and reveal patchy partial thickness skin loss which is red and moist.  Mod amt yellow drainage, no odor. Wound type: Left heel with dark reddish purple deep tissue injury; 3X3cm Right heel with stage 1 pressure injury; 5X4cm, red and nonblanchable.  Pressure Injury POA: No Dressing procedure/placement/frequency: Pt has been on a low airloss bed to reduce pressure and has Prevalon boots to bilat heels to reduce pressure, foam dressing to buttocks and sacrum.  No further topical treatment is indicated at this time.  Deep tissue injuries are high risk to evolve into full thickness skin loss. Pt is on the vent and critically ill with multiple systemic factors which can impair healing.  No family present to discuss plan of care. Please re-consult if further assistance is needed.  Thank-you,  Cammie Mcgee MSN, RN, CWOCN, [place], CNS [phone]

## 2017-09-25 NOTE — Progress Notes (Signed)
INFECTIOUS DISEASE PROGRESS NOTE  ID: Curtis Keller is a 60 y.o. male with  Active Problems:   Acute systolic (congestive) heart failure (HCC)   Acute respiratory failure with hypoxia (HCC)   Acute hypoxemic respiratory failure (HCC)  Subjective: On vent. Awakens to stim.   Abtx:  Anti-infectives (From admission, onward)   Start     Dose/Rate Route Frequency Ordered Stop   09/22/17 1000  metroNIDAZOLE (FLAGYL) IVPB 500 mg     500 mg 100 mL/hr over 60 Minutes Intravenous Every 8 hours 09/22/17 0949     09/21/17 0600  vancomycin (VANCOCIN) IVPB 750 mg/150 ml premix     750 mg 150 mL/hr over 60 Minutes Intravenous Every 24 hours 09/20/17 0730     09/20/17 1400  piperacillin-tazobactam (ZOSYN) IVPB 3.375 g  Status:  Discontinued     3.375 g 12.5 mL/hr over 240 Minutes Intravenous Every 8 hours 09/20/17 0730 09/21/17 1004   09/20/17 0800  vancomycin (VANCOCIN) IVPB 750 mg/150 ml premix     750 mg 150 mL/hr over 60 Minutes Intravenous  Once 09/20/17 0730 09/20/17 2012   09/20/17 0415  ceFEPIme (MAXIPIME) 2 g in dextrose 5 % 50 mL IVPB     2 g 100 mL/hr over 30 Minutes Intravenous  Once 09/20/17 0402 09/20/17 0512   09/20/17 0415  vancomycin (VANCOCIN) IVPB 1000 mg/200 mL premix     1,000 mg 200 mL/hr over 60 Minutes Intravenous  Once 09/20/17 0402 09/20/17 0548      Medications:  Scheduled: . chlorhexidine gluconate (MEDLINE KIT)  15 mL Mouth Rinse BID  . feeding supplement (PRO-STAT SUGAR FREE 64)  30 mL Per Tube BID  . free water  200 mL Per Tube Q8H  . [START ON 09/26/2017] furosemide  40 mg Intravenous Daily  . insulin aspart  0-15 Units Subcutaneous Q4H  . mouth rinse  15 mL Mouth Rinse QID  . senna-docusate  1 tablet Oral Daily    Objective: Vital signs in last 24 hours: Temp:  [98.6 F (37 C)-99.1 F (37.3 C)] 98.6 F (37 C) (12/03 0823) Pulse Rate:  [67-97] 67 (12/03 0758) Resp:  [12-32] 16 (12/03 0758) BP: (107-134)/(71-95) 112/79 (12/03 0758) SpO2:   [92 %-100 %] 97 % (12/03 0758) Arterial Line BP: (114-124)/(69-73) 124/73 (12/02 1200) FiO2 (%):  [30 %] 30 % (12/03 0758) Weight:  [96 kg (211 lb 10.3 oz)] 96 kg (211 lb 10.3 oz) (12/03 0338)   General appearance: no distress Resp: diminished breath sounds anterior - bilateral Cardio: regular rate and rhythm GI: normal findings: bowel sounds normal, soft, non-tender and g-tube Extremities: edema none LE  Lab Results Recent Labs    09/24/17 0408 09/25/17 0327  WBC 8.9 10.7*  HGB 9.7* 10.2*  HCT 30.6* 32.1*  NA 142 144  K 3.8 4.3  CL 111 112*  CO2 23 25  BUN 72* 77*  CREATININE 2.09* 2.08*   Liver Panel No results for input(s): PROT, ALBUMIN, AST, ALT, ALKPHOS, BILITOT, BILIDIR, IBILI in the last 72 hours. Sedimentation Rate No results for input(s): ESRSEDRATE in the last 72 hours. C-Reactive Protein No results for input(s): CRP in the last 72 hours.  Microbiology: Recent Results (from the past 240 hour(s))  Culture, blood (routine x 2)     Status: Abnormal   Collection Time: 09/20/17  3:30 AM  Result Value Ref Range Status   Specimen Description BLOOD RIGHT ANTECUBITAL  Final   Special Requests IN PEDIATRIC BOTTLE Blood Culture  adequate volume  Final   Culture  Setup Time   Final    GRAM POSITIVE COCCI IN CLUSTERS IN PEDIATRIC BOTTLE CRITICAL VALUE NOTED.  VALUE IS CONSISTENT WITH PREVIOUSLY REPORTED AND CALLED VALUE.    Culture (A)  Final    STAPHYLOCOCCUS SPECIES (COAGULASE NEGATIVE) THE SIGNIFICANCE OF ISOLATING THIS ORGANISM FROM A SINGLE SET OF BLOOD CULTURES WHEN MULTIPLE SETS ARE DRAWN IS UNCERTAIN. PLEASE NOTIFY THE MICROBIOLOGY DEPARTMENT WITHIN ONE WEEK IF SPECIATION AND SENSITIVITIES ARE REQUIRED.    Report Status 09/22/2017 FINAL  Final  Culture, blood (routine x 2)     Status: Abnormal   Collection Time: 09/20/17  3:40 AM  Result Value Ref Range Status   Specimen Description BLOOD LEFT HAND  Final   Special Requests   Final    BOTTLES DRAWN AEROBIC  AND ANAEROBIC Blood Culture adequate volume   Culture  Setup Time   Final    GRAM POSITIVE RODS ANAEROBIC BOTTLE ONLY CRITICAL RESULT CALLED TO, READ BACK BY AND VERIFIED WITH: AMEYER,PHARMD @1830  09/20/17 BY LHOWARD GRAM POSITIVE COCCI IN CLUSTERS AEROBIC BOTTLE ONLY CRITICAL RESULT CALLED TO, READ BACK BY AND VERIFIED WITH: AMEYER.PHARMD @2100  09/20/17 BY LHOWARD    Culture (A)  Final    CLOSTRIDIUM PERFRINGENS METHICILLIN RESISTANT STAPHYLOCOCCUS AUREUS    Report Status 09/22/2017 FINAL  Final   Organism ID, Bacteria METHICILLIN RESISTANT STAPHYLOCOCCUS AUREUS  Final      Susceptibility   Methicillin resistant staphylococcus aureus - MIC*    CIPROFLOXACIN >=8 RESISTANT Resistant     ERYTHROMYCIN >=8 RESISTANT Resistant     GENTAMICIN <=0.5 SENSITIVE Sensitive     OXACILLIN >=4 RESISTANT Resistant     TETRACYCLINE <=1 SENSITIVE Sensitive     VANCOMYCIN 1 SENSITIVE Sensitive     TRIMETH/SULFA <=10 SENSITIVE Sensitive     CLINDAMYCIN RESISTANT Resistant     RIFAMPIN <=0.5 SENSITIVE Sensitive     Inducible Clindamycin POSITIVE Resistant     * METHICILLIN RESISTANT STAPHYLOCOCCUS AUREUS  Blood Culture ID Panel (Reflexed)     Status: None   Collection Time: 09/20/17  3:40 AM  Result Value Ref Range Status   Enterococcus species NOT DETECTED NOT DETECTED Final   Listeria monocytogenes NOT DETECTED NOT DETECTED Final   Staphylococcus species NOT DETECTED NOT DETECTED Final   Staphylococcus aureus NOT DETECTED NOT DETECTED Final   Streptococcus species NOT DETECTED NOT DETECTED Final   Streptococcus agalactiae NOT DETECTED NOT DETECTED Final   Streptococcus pneumoniae NOT DETECTED NOT DETECTED Final   Streptococcus pyogenes NOT DETECTED NOT DETECTED Final   Acinetobacter baumannii NOT DETECTED NOT DETECTED Final   Enterobacteriaceae species NOT DETECTED NOT DETECTED Final   Enterobacter cloacae complex NOT DETECTED NOT DETECTED Final   Escherichia coli NOT DETECTED NOT DETECTED  Final   Klebsiella oxytoca NOT DETECTED NOT DETECTED Final   Klebsiella pneumoniae NOT DETECTED NOT DETECTED Final   Proteus species NOT DETECTED NOT DETECTED Final   Serratia marcescens NOT DETECTED NOT DETECTED Final   Haemophilus influenzae NOT DETECTED NOT DETECTED Final   Neisseria meningitidis NOT DETECTED NOT DETECTED Final   Pseudomonas aeruginosa NOT DETECTED NOT DETECTED Final   Candida albicans NOT DETECTED NOT DETECTED Final   Candida glabrata NOT DETECTED NOT DETECTED Final   Candida krusei NOT DETECTED NOT DETECTED Final   Candida parapsilosis NOT DETECTED NOT DETECTED Final   Candida tropicalis NOT DETECTED NOT DETECTED Final  Blood Culture ID Panel (Reflexed)     Status: Abnormal  Collection Time: 09/20/17  3:40 AM  Result Value Ref Range Status   Enterococcus species NOT DETECTED NOT DETECTED Final   Listeria monocytogenes NOT DETECTED NOT DETECTED Final   Staphylococcus species DETECTED (A) NOT DETECTED Final    Comment: CRITICAL RESULT CALLED TO, READ BACK BY AND VERIFIED WITH: AMEYER,PHARMD '@2100'$  09/20/17 BY LHOWARD    Staphylococcus aureus DETECTED (A) NOT DETECTED Final    Comment: Methicillin (oxacillin)-resistant Staphylococcus aureus (MRSA). MRSA is predictably resistant to beta-lactam antibiotics (except ceftaroline). Preferred therapy is vancomycin unless clinically contraindicated. Patient requires contact precautions if  hospitalized. CRITICAL RESULT CALLED TO, READ BACK BY AND VERIFIED WITH: AMEYER,PHARMD '@2100'$  09/20/17 BY LHOWARD    Methicillin resistance DETECTED (A) NOT DETECTED Final    Comment: CRITICAL RESULT CALLED TO, READ BACK BY AND VERIFIED WITH: AMEYER,PHARMD '@2100'$  09/20/17    Streptococcus species NOT DETECTED NOT DETECTED Final   Streptococcus agalactiae NOT DETECTED NOT DETECTED Final   Streptococcus pneumoniae NOT DETECTED NOT DETECTED Final   Streptococcus pyogenes NOT DETECTED NOT DETECTED Final   Acinetobacter baumannii NOT  DETECTED NOT DETECTED Final   Enterobacteriaceae species NOT DETECTED NOT DETECTED Final   Enterobacter cloacae complex NOT DETECTED NOT DETECTED Final   Escherichia coli NOT DETECTED NOT DETECTED Final   Klebsiella oxytoca NOT DETECTED NOT DETECTED Final   Klebsiella pneumoniae NOT DETECTED NOT DETECTED Final   Proteus species NOT DETECTED NOT DETECTED Final   Serratia marcescens NOT DETECTED NOT DETECTED Final   Haemophilus influenzae NOT DETECTED NOT DETECTED Final   Neisseria meningitidis NOT DETECTED NOT DETECTED Final   Pseudomonas aeruginosa NOT DETECTED NOT DETECTED Final   Candida albicans NOT DETECTED NOT DETECTED Final   Candida glabrata NOT DETECTED NOT DETECTED Final   Candida krusei NOT DETECTED NOT DETECTED Final   Candida parapsilosis NOT DETECTED NOT DETECTED Final   Candida tropicalis NOT DETECTED NOT DETECTED Final  Culture, respiratory (NON-Expectorated)     Status: None   Collection Time: 09/20/17  8:20 AM  Result Value Ref Range Status   Specimen Description TRACHEAL ASPIRATE  Final   Special Requests NONE  Final   Gram Stain   Final    ABUNDANT WBC PRESENT, PREDOMINANTLY PMN RARE SQUAMOUS EPITHELIAL CELLS PRESENT MODERATE GRAM POSITIVE COCCI IN PAIRS    Culture   Final    MODERATE METHICILLIN RESISTANT STAPHYLOCOCCUS AUREUS   Report Status 09/22/2017 FINAL  Final   Organism ID, Bacteria METHICILLIN RESISTANT STAPHYLOCOCCUS AUREUS  Final      Susceptibility   Methicillin resistant staphylococcus aureus - MIC*    CIPROFLOXACIN >=8 RESISTANT Resistant     ERYTHROMYCIN >=8 RESISTANT Resistant     GENTAMICIN <=0.5 SENSITIVE Sensitive     OXACILLIN >=4 RESISTANT Resistant     TETRACYCLINE <=1 SENSITIVE Sensitive     VANCOMYCIN <=0.5 SENSITIVE Sensitive     TRIMETH/SULFA <=10 SENSITIVE Sensitive     CLINDAMYCIN RESISTANT Resistant     RIFAMPIN <=0.5 SENSITIVE Sensitive     Inducible Clindamycin POSITIVE Resistant     * MODERATE METHICILLIN RESISTANT  STAPHYLOCOCCUS AUREUS  MRSA PCR Screening     Status: Abnormal   Collection Time: 09/20/17  9:16 AM  Result Value Ref Range Status   MRSA by PCR POSITIVE (A) NEGATIVE Final    Comment:        The GeneXpert MRSA Assay (FDA approved for NASAL specimens only), is one component of a comprehensive MRSA colonization surveillance program. It is not intended to diagnose MRSA infection nor  to guide or monitor treatment for MRSA infections. RESULT CALLED TO, READ BACK BY AND VERIFIED WITH: Jacolyn Reedy RN 11:20 09/20/17 ([email or web address])   Urine Culture     Status: None   Collection Time: 09/21/17 12:57 PM  Result Value Ref Range Status   Specimen Description URINE, CLEAN CATCH  Final   Special Requests NONE  Final   Culture NO GROWTH  Final   Report Status 09/22/2017 FINAL  Final  Culture, blood (routine x 2)     Status: None (Preliminary result)   Collection Time: 09/22/17  8:20 AM  Result Value Ref Range Status   Specimen Description BLOOD RIGHT HAND  Final   Special Requests IN PEDIATRIC BOTTLE Blood Culture adequate volume  Final   Culture NO GROWTH 3 DAYS  Final   Report Status PENDING  Incomplete  Culture, blood (routine x 2)     Status: None (Preliminary result)   Collection Time: 09/22/17  8:30 AM  Result Value Ref Range Status   Specimen Description BLOOD LEFT HAND  Final   Special Requests IN PEDIATRIC BOTTLE Blood Culture adequate volume  Final   Culture NO GROWTH 3 DAYS  Final   Report Status PENDING  Incomplete    Studies/Results: Dg Chest Port 1 View  Result Date: 09/25/2017 CLINICAL DATA:  Acute respiratory failure. EXAM: PORTABLE CHEST 1 VIEW COMPARISON:  09/24/2017. FINDINGS: Heart is enlarged. Moderately extensive BILATERAL airspace disease which could represent infection or edema is redemonstrated, and not significantly improved. Support tubes and lines are unchanged. There is no pneumothorax. No osseous findings. IMPRESSION: Cardiomegaly with moderately extensive  BILATERAL pulmonary opacities which could represent infection or edema, stable. Electronically Signed   By: Staci Righter M.D.   On: 09/25/2017 07:03   Dg Chest Port 1 View  Result Date: 09/24/2017 CLINICAL DATA:  Acute respiratory failure EXAM: PORTABLE CHEST 1 VIEW COMPARISON:  09/22/2017 FINDINGS: Endotracheal tube and NG tube are unchanged. Cardiomegaly. Diffuse bilateral airspace disease has worsened since prior study. No visible significant effusions or acute bony abnormality. IMPRESSION: Extensive bilateral airspace disease, worsening since prior study. This could represent edema or infection. Electronically Signed   By: Rolm Baptise M.D.   On: 09/24/2017 07:33     Assessment/Plan: Staph pneumonia VDRF C perfringens bacteremia Apical mural thrombus CKD3   Total days of antibiotics: 5 vanco, 3 flagyl Repeat BCx 11-30 are ngtd       Will aim for 2 weeks if vanco/flagyl He has defined source for his bacteremia (pneumonia) His CT does not show source for his Clostridium Consider TEE if possible for his bacteremia, pneumonia Cr improved; prev 1.65- 1.83 June 2018  Bobby Rumpf MD, FACP Infectious Diseases (pager) [phone] [email or web address] 09/25/2017, 9:45 AM  LOS: 5 days

## 2017-09-25 NOTE — Progress Notes (Signed)
PULMONARY / CRITICAL CARE MEDICINE   Name: Curtis Keller MRN: [id number] DOB: [birth year]    ADMISSION DATE:  09/20/2017 CONSULTATION DATE:  10/20/2017  REFERRING MD:  ED  CHIEF COMPLAINT:  Shortness of breath  HISTORY OF PRESENT ILLNESS:   61 yr old M, SNF resident,  with PMH of COPD, CHF EF 20%, CAD, apical mural thrombus admitted 11/28 with reports of altered mental status for 2 days which was thought to be due to UTI. He was altered as per his sister for few days and the morning of admit he started having severe shortness of breath saturating in the 50s with minimal improvemnet on CPAP.  He was intubated in the ED which was very difficult as per the ED physician. Please refer to their note. CXR concerning for bilateral infiltrates, treated with cefepime and vancomycin.    SUBJECTIVE:  Net neg 912 ml in last 24 hrs / remains 4L+ for admit.  RN reports no acute events overnight.  Pt weaning on PSV 5/5 (reported difficult intubation)  VITAL SIGNS: BP 112/79   Pulse 67   Temp 98.6 F (37 C) (Axillary)   Resp 16   Ht 6' (1.829 m)   Wt 211 lb 10.3 oz (96 kg)   SpO2 97%   BMI 28.70 kg/m   HEMODYNAMICS:    VENTILATOR SETTINGS: Vent Mode: PSV;CPAP FiO2 (%):  [30 %] 30 % Set Rate:  [24 bmp] 24 bmp Vt Set:  [470 mL] 470 mL PEEP:  [5 cmH20] 5 cmH20 Pressure Support:  [5 cmH20-10 cmH20] 5 cmH20 Plateau Pressure:  [15 cmH20-24 cmH20] 17 cmH20  INTAKE / OUTPUT: I/O last 3 completed shifts: In: 4673.3 [I.V.:1253.3; Other:120; NG/GT:2700; IV Piggyback:600] Out: 5325 [Urine:5325]  PHYSICAL EXAMINATION: General: adult male in NAD on vent HEENT: MM pink/moist, ETT, pupils 693mm/R PSY: calm Neuro: awake, nods appropriately, follows commands, hemiplegia on L CV: s1s2 irr irr, no m/r/g PULM: even/non-labored, lungs bilaterally clear GI: soft, non-tender, bsx4 active, PEG clean/dry/intact  Extremities: warm/dry, no edema  Skin: no rashes or lesions  LABS:  BMET Recent Labs   Lab 09/23/17 0400 09/24/17 0408 09/25/17 0327  NA 142 142 144  K 4.0 3.8 4.3  CL 113* 111 112*  CO2 23 23 25   BUN 67* 72* 77*  CREATININE 2.32* 2.09* 2.08*  GLUCOSE 127* 103* 119*    Electrolytes Recent Labs  Lab 09/22/17 0459 09/23/17 0400 09/24/17 0408 09/25/17 0327  CALCIUM 8.6* 8.5* 8.6* 9.0  MG 2.0  --  1.9 2.4  PHOS 3.3  --  3.7 3.5    CBC Recent Labs  Lab 09/23/17 0400 09/24/17 0408 09/25/17 0327  WBC 12.0* 8.9 10.7*  HGB 9.3* 9.7* 10.2*  HCT 29.7* 30.6* 32.1*  PLT 174 169 198    Coag's Recent Labs  Lab 09/20/17 0321 09/21/17 0431 09/22/17 0459  INR 1.77 2.13 1.77    Sepsis Markers Recent Labs  Lab 09/20/17 0604 09/21/17 1257 09/22/17 0459  LATICACIDVEN 4.63* 1.1 0.9  PROCALCITON  --  4.14  --     ABG Recent Labs  Lab 09/20/17 1238 09/21/17 0418 09/22/17 0343  PHART 7.364 7.395 7.372  PCO2ART 37.4 37.9 40.0  PO2ART 274* 92.2 114*    Liver Enzymes Recent Labs  Lab 09/21/17 0431 09/22/17 0459  AST 55* 30  ALT 19 16*  ALKPHOS 67 66  BILITOT 0.9 0.5  ALBUMIN 2.2* 2.1*    Cardiac Enzymes Recent Labs  Lab 09/21/17 1257 09/22/17 0459 09/24/17 0408  TROPONINI 8.70* 6.19* 2.69*    Glucose Recent Labs  Lab 09/24/17 1218 09/24/17 1643 09/24/17 1930 09/24/17 2321 09/25/17 0332 09/25/17 0826  GLUCAP 115* 105* 101* 113* 113* 100*    Imaging Dg Chest Port 1 View  Result Date: 09/25/2017 CLINICAL DATA:  Acute respiratory failure. EXAM: PORTABLE CHEST 1 VIEW COMPARISON:  09/24/2017. FINDINGS: Heart is enlarged. Moderately extensive BILATERAL airspace disease which could represent infection or edema is redemonstrated, and not significantly improved. Support tubes and lines are unchanged. There is no pneumothorax. No osseous findings. IMPRESSION: Cardiomegaly with moderately extensive BILATERAL pulmonary opacities which could represent infection or edema, stable. Electronically Signed   By: Elsie Stain M.D.   On:  09/25/2017 07:03   Culture data Sputum culture 11/28 >> MRSA BCx2 11/28 >> MRSA, C perfringens BCx2 11/30 >>   ANTIBIOTICS: Zosyn 11/28 >> 11/29 Vanc 11/28 >> Flagyl 11/30 >>  ASSESSMENT / PLAN: 60 year old SNF resident with multiple medical issues admitted with MRSA and Clostridium bacteremia, & MRSA HCAP.   Acute hypoxemic respiratory failure in the setting of diffuse pulmonary infiltrates .   MRSA  pneumonia with ALI  P: PRVC 8 cc/kg  Wean PEEP / FiO2 for sats > 90% Follow intermittent CXR  PSV wean as tolerated  ABX as above Consider extubation, emergency equipment to bedside as precaution   Septic Shock - volume responsive w/ Clostridium Perfringens and MRSA bacteremia  Systolic HFrEF (20%). Repeat echo shows unchaged EF Elevated troponin, suspect demand ischemia he is not a candidate for intervention; this has trended down  P: ICU monitoring  ABX as above Have asked Cards for TEE  Follow repeat cultures Lasix 40 mg IV QD  History of left ventricular thrombus, on chronic warfarin therapy Anemia  P: Continue heparin gtt per pharmacy  Trend CBC   Acute on chronic kidney disease  Mild hyperchloremia  P: Trend BMP / urinary output Replace electrolytes as indicated Avoid nephrotoxic agents, ensure adequate renal perfusion Free water 200 ml Q8  Metabolic encephalopathy; with history of stroke and left-sided hemiplegia since June 2018.  CT head neg. P: Minimize sedating medications Early PT efforts   Constipation Hx of dysphasia; has chronic PEG Hx of Hep C P: TF per Nutrition  PEG care per protocol  Pepcid Q24  Add senokot, hold for diarrhea  DM P: CBG with SSI    FAMILY  - Updates:  No family available at bedside am 12/3.     CC Time:  30 minutes.     Canary Brim, NP-C [place] [facility] Pgr: [phone] or if no answer [phone] 09/25/2017, 8:43 AM  Attending Note:  61 year old male with PMH as above who was found down in  the SNF hypoxemic and noted to have MSSA bacteremia and HCAP.  Patient was brought to [facility] and intubated.  This AM patient is weaning very well and his lungs are relatively clear.  I reviewed CXR myself, ETT is in good position.  Will plan to proceed with extubation today.  If fails then will reintubate with the understanding that we would proceed with tracheostomy.  Appreciate input from ID and cards.  SLP and ambulate post extubation.  The patient is critically ill with multiple organ systems failure and requires high complexity decision making for assessment and support, frequent evaluation and titration of therapies, application of advanced monitoring technologies and extensive interpretation of multiple databases.   Critical Care Time devoted to patient care services described in this note is  35  Minutes. This time reflects time of care of this signee Dr [name]. This critical care time does not reflect procedure time, or teaching time or supervisory time of PA/NP/Med student/Med Resident etc but could involve care discussion time.  Rush Farmer, M.D. [facility] Pulmonary/Critical Care Medicine. Pager: [phone]. After hours pager: [phone].

## 2017-09-25 NOTE — NC FL2 (Signed)
[place] LEVEL OF CARE SCREENING TOOL     IDENTIFICATION  Patient Name: Curtis Keller Birthdate: [birth year] Sex: male Admission Date (Current Location): 09/20/2017  [facility] and [place] Number:  Herbalist and Address:  [facility], [place] [address] [place], [place] [address]      Provider Number: [id number]  Attending Physician Name and Address:  Marshell Garfinkel, MD  Relative Name and Phone Number:  Lavell Islam [phone]    Current Level of Care: Hospital Recommended Level of Care: [place] Prior Approval Number:    Date Approved/Denied:   PASRR Number: [id number]  Discharge Plan: SNF    Current Diagnoses: Patient Active Problem List   Diagnosis Date Noted  . Bacteremia   . Acute hypoxemic respiratory failure ([place]) 09/20/2017  . Lactic acidosis   . Acute pulmonary edema (HCC)   . Recurrent aspiration pneumonia ([place]) 05/06/2017  . Tracheostomy complication ([place]) 10/62/6948  . Complication of tracheostomy tube ([place])   . Sepsis ([place]) 04/07/2017  . Acute respiratory failure with hypoxia ([place])   . SOB (shortness of breath)   . Hypoxia   . Pressure injury of skin 03/20/2017  . HCAP (healthcare-associated pneumonia) 03/18/2017  . Pneumonia 03/17/2017  . 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 Depression with anxiety 01/23/2017  . Encounter for hospice care discussion   . DNR (do not resuscitate)   . Ischemic cardiomyopathy   . Goals of care, counseling/discussion   . Palliative care encounter   . Acute systolic (congestive) heart failure ([place])   . Acute and chronic respiratory failure with hypoxia ([place]) 11/28/2016  . Hip fracture ([place]) 11/27/2016  . Closed nondisplaced intertrochanteric fracture of left femur ([place]) 11/27/2016  . Chronic respiratory failure ([place]) 11/27/2016  . Fall at nursing home 07/17/2016  . Diabetes mellitus, type II, insulin dependent ([place])  06/12/2016  . GERD (gastroesophageal reflux disease) 05/07/2016  . COPD (chronic obstructive pulmonary disease) ([place]) 04/18/2016  . Chronic anticoagulation 03/22/2016  . S/P splenectomy 03/13/2016  . PEG (percutaneous endoscopic gastrostomy) status ([place]) 03/13/2016  . MRSA (methicillin resistant Staphylococcus aureus) septicemia ([place]) 03/13/2016  . Aspiration pneumonia ([place]) 03/13/2016  . Dysphagia 03/13/2016  . Acute blood loss anemia 03/13/2016  . Hyperlipidemia 03/13/2016  . Heart failure, chronic systolic ([place])   . Hypertension   . Depression   . Cardiomyopathy ([place])   . Apical mural thrombus   . Protein calorie malnutrition ([place])   . Intraparenchymal hematoma of brain due to trauma [facility])     Orientation RESPIRATION BLADDER Height & Weight     (Disoriented x4)  O2(Nasal cannula 4L) Incontinent, Indwelling catheter Weight: 96 kg (211 lb 10.3 oz) Height:  6' (182.9 cm)  BEHAVIORAL SYMPTOMS/MOOD NEUROLOGICAL BOWEL NUTRITION STATUS      Incontinent(Gastrostomy tube) Diet(Please see DC Summary)  AMBULATORY STATUS COMMUNICATION OF NEEDS Skin   Extensive Assist Verbally PU Stage and Appropriate Care(deep tissue injury on coccyx and heel;Stage I on heel)                       Personal Care Assistance Level of Assistance  Bathing, Feeding, Dressing Bathing Assistance: Maximum assistance Feeding assistance: Limited assistance Dressing Assistance: Maximum assistance     Functional Limitations Info             SPECIAL CARE FACTORS FREQUENCY  PT (By licensed PT)     PT Frequency: 5x/week  Contractures      Additional Factors Info  Code Status, Allergies, Isolation Precautions, Insulin Sliding Scale Code Status Info: Full Allergies Info: Codeine, Lisinopril, Hydrocodone, Hydrocodone-acetaminophen, Tegaderm Ag Mesh Silver, Tylenol Acetaminophen   Insulin Sliding Scale Info: Every 4 hours Isolation Precautions Info: MRSA     Current Medications  (09/25/2017):  This is the current hospital active medication list Current Facility-Administered Medications  Medication Dose Route Frequency Provider Last Rate Last Dose  . 0.9 %  sodium chloride infusion  250 mL Intravenous PRN Aldean Jewett, MD 10 mL/hr at 09/25/17 1500 250 mL at 09/25/17 1500  . chlorhexidine gluconate (MEDLINE KIT) (PERIDEX) 0.12 % solution 15 mL  15 mL Mouth Rinse BID Mannam, Praveen, MD   15 mL at 09/25/17 0800  . famotidine (PEPCID) IVPB 20 mg premix  20 mg Intravenous Q24H Mannam, Praveen, MD   Stopped at 09/25/17 1100  . feeding supplement (PRO-STAT SUGAR FREE 64) liquid 30 mL  30 mL Per Tube BID Erick Colace, NP   30 mL at 09/25/17 1034  . feeding supplement (VITAL AF 1.2 CAL) liquid 1,000 mL  1,000 mL Per Tube Continuous Erick Colace, NP 60 mL/hr at 09/25/17 1500 1,000 mL at 09/25/17 1500  . fentaNYL (SUBLIMAZE) injection 100 mcg  100 mcg Intravenous Q2H PRN Aldean Jewett, MD   100 mcg at 09/25/17 0538  . free water 200 mL  200 mL Per Tube Q8H Erick Colace, NP   200 mL at 09/25/17 0538  . [START ON 09/26/2017] furosemide (LASIX) injection 40 mg  40 mg Intravenous Daily Ollis, Brandi L, NP      . heparin ADULT infusion 100 units/mL (25000 units/272m sodium chloride 0.45%)  2,800 Units/hr Intravenous Continuous MHarvel Quale RPH 28 mL/hr at 09/25/17 1500 2,800 Units/hr at 09/25/17 1500  . insulin aspart (novoLOG) injection 0-15 Units  0-15 Units Subcutaneous Q4H BErick Colace NP   2 Units at 09/24/17 2326  . MEDLINE mouth rinse  15 mL Mouth Rinse QID Mannam, Praveen, MD   15 mL at 09/25/17 0340  . metroNIDAZOLE (FLAGYL) IVPB 500 mg  500 mg Intravenous Q8H SCarlyle Basques MD   Stopped at 09/25/17 1140  . senna-docusate (Senokot-S) tablet 1 tablet  1 tablet Oral Daily Ollis, Brandi L, NP   1 tablet at 09/25/17 1035  . vancomycin (VANCOCIN) IVPB 750 mg/150 ml premix  750 mg Intravenous Q24H AAldean Jewett MD 150 mL/hr at 09/25/17 0529 750 mg at  09/25/17 09604    Discharge Medications: Please see discharge summary for a list of discharge medications.  Relevant Imaging Results:  Relevant Lab Results:   Additional Information SS#: [id number] NBenard Halsted LCSWA

## 2017-09-26 ENCOUNTER — Inpatient Hospital Stay (HOSPITAL_COMMUNITY): Payer: Medicare Other

## 2017-09-26 DIAGNOSIS — J9600 Acute respiratory failure, unspecified whether with hypoxia or hypercapnia: Secondary | ICD-10-CM

## 2017-09-26 DIAGNOSIS — G9340 Encephalopathy, unspecified: Secondary | ICD-10-CM | POA: Diagnosis present

## 2017-09-26 LAB — GLUCOSE, CAPILLARY
GLUCOSE-CAPILLARY: 119 mg/dL — AB (ref 65–99)
GLUCOSE-CAPILLARY: 105 mg/dL — AB (ref 65–99)
Glucose-Capillary: 112 mg/dL — ABNORMAL HIGH (ref 65–99)
Glucose-Capillary: 95 mg/dL (ref 65–99)
Glucose-Capillary: 118 mg/dL — ABNORMAL HIGH (ref 65–99)
Glucose-Capillary: 120 mg/dL — ABNORMAL HIGH (ref 65–99)

## 2017-09-26 LAB — MAGNESIUM: MAGNESIUM: 2.2 mg/dL (ref 1.7–2.4)

## 2017-09-26 LAB — BASIC METABOLIC PANEL
Anion gap: 11 (ref 5–15)
BUN: 80 mg/dL — ABNORMAL HIGH (ref 6–20)
CHLORIDE: 106 mmol/L (ref 101–111)
CO2: 25 mmol/L (ref 22–32)
CREATININE: 1.93 mg/dL — AB (ref 0.61–1.24)
Calcium: 8.8 mg/dL — ABNORMAL LOW (ref 8.9–10.3)
GFR, EST AFRICAN AMERICAN: 41 mL/min — AB (ref >60)
GFR, EST NON AFRICAN AMERICAN: 36 mL/min — AB (ref >60)
Glucose, Bld: 114 mg/dL — ABNORMAL HIGH (ref 65–99)
POTASSIUM: 3.8 mmol/L (ref 3.5–5.1)
SODIUM: 142 mmol/L (ref 135–145)

## 2017-09-26 LAB — CBC
HEMATOCRIT: 31.9 % — AB (ref 39.0–52.0)
HEMOGLOBIN: 10.1 g/dL — AB (ref 13.0–17.0)
MCH: 31 pg (ref 26.0–34.0)
MCHC: 31.7 g/dL (ref 30.0–36.0)
MCV: 97.9 fL (ref 78.0–100.0)
Platelets: 215 10*3/uL (ref 150–400)
RBC: 3.26 MIL/uL — ABNORMAL LOW (ref 4.22–5.81)
RDW: 14.5 % (ref 11.5–15.5)
WBC: 12.2 10*3/uL — AB (ref 4.0–10.5)

## 2017-09-26 LAB — VANCOMYCIN, TROUGH: VANCOMYCIN TR: 21 ug/mL — AB (ref 15–20)

## 2017-09-26 LAB — HEPARIN LEVEL (UNFRACTIONATED): HEPARIN UNFRACTIONATED: 0.44 [IU]/mL (ref 0.30–0.70)

## 2017-09-26 MED ORDER — ATORVASTATIN CALCIUM 40 MG PO TABS
40.0000 mg | ORAL_TABLET | Freq: Every day | ORAL | Status: DC
  Administered 2017-09-26 – 2017-10-09 (×14): 40 mg
  Filled 2017-09-26 (×16): qty 1

## 2017-09-26 MED ORDER — ATROPINE SULFATE 1 MG/10ML IJ SOSY
PREFILLED_SYRINGE | INTRAMUSCULAR | Status: AC
Start: 2017-09-26 — End: 2017-09-27
  Filled 2017-09-26: qty 10

## 2017-09-26 MED ORDER — FENTANYL CITRATE (PF) 100 MCG/2ML IJ SOLN
100.0000 ug | INTRAMUSCULAR | Status: DC | PRN
  Administered 2017-09-27: 100 ug via INTRAVENOUS

## 2017-09-26 MED ORDER — ETOMIDATE 2 MG/ML IV SOLN
20.0000 mg | Freq: Once | INTRAVENOUS | Status: AC
  Administered 2017-09-26: 20 mg via INTRAVENOUS

## 2017-09-26 MED ORDER — TIZANIDINE HCL 4 MG PO TABS
12.0000 mg | ORAL_TABLET | Freq: Three times a day (TID) | ORAL | Status: DC
  Administered 2017-09-26 (×2): 12 mg
  Filled 2017-09-26 (×3): qty 3

## 2017-09-26 MED ORDER — FENTANYL CITRATE (PF) 100 MCG/2ML IJ SOLN: 100.0000 ug | INTRAMUSCULAR | Status: DC | PRN

## 2017-09-26 MED ORDER — TIZANIDINE HCL 6 MG PO CAPS: 18.0000 mg | ORAL_CAPSULE | Freq: Three times a day (TID) | ORAL | Status: DC

## 2017-09-26 MED ORDER — ORAL CARE MOUTH RINSE
15.0000 mL | Freq: Four times a day (QID) | OROMUCOSAL | Status: DC
  Administered 2017-09-27 – 2017-10-10 (×44): 15 mL via OROMUCOSAL

## 2017-09-26 MED ORDER — MIDAZOLAM HCL 2 MG/2ML IJ SOLN
INTRAMUSCULAR | Status: AC
  Filled 2017-09-26: qty 4

## 2017-09-26 MED ORDER — EPINEPHRINE PF 1 MG/10ML IJ SOSY
PREFILLED_SYRINGE | INTRAMUSCULAR | Status: AC
  Filled 2017-09-26: qty 10

## 2017-09-26 MED ORDER — PRO-STAT SUGAR FREE PO LIQD
30.0000 mL | Freq: Two times a day (BID) | ORAL | Status: DC
  Administered 2017-09-26 – 2017-09-27 (×2): 30 mL
  Filled 2017-09-26 (×2): qty 30

## 2017-09-26 MED ORDER — SODIUM CHLORIDE 0.9% FLUSH
10.0000 mL | Freq: Two times a day (BID) | INTRAVENOUS | Status: DC
  Administered 2017-09-26: 20 mL
  Administered 2017-09-26 – 2017-10-10 (×18): 10 mL

## 2017-09-26 MED ORDER — PRO-STAT SUGAR FREE PO LIQD
30.0000 mL | Freq: Two times a day (BID) | ORAL | Status: DC
  Administered 2017-09-26: 30 mL via ORAL
  Filled 2017-09-26 (×2): qty 30

## 2017-09-26 MED ORDER — JEVITY 1.2 CAL PO LIQD
1000.0000 mL | ORAL | Status: DC
  Administered 2017-09-26: 17:00:00
  Filled 2017-09-26 (×5): qty 1000

## 2017-09-26 MED ORDER — DIAZEPAM 5 MG PO TABS
2.5000 mg | ORAL_TABLET | ORAL | Status: DC
  Administered 2017-09-26: 2.5 mg via ORAL
  Filled 2017-09-26: qty 1

## 2017-09-26 MED ORDER — MIDAZOLAM HCL 2 MG/2ML IJ SOLN
2.0000 mg | Freq: Once | INTRAMUSCULAR | Status: AC
  Administered 2017-09-26: 2 mg via INTRAVENOUS

## 2017-09-26 MED ORDER — FENTANYL CITRATE (PF) 100 MCG/2ML IJ SOLN
INTRAMUSCULAR | Status: AC
  Filled 2017-09-26: qty 2

## 2017-09-26 MED ORDER — CHLORHEXIDINE GLUCONATE CLOTH 2 % EX PADS
6.0000 | MEDICATED_PAD | Freq: Every day | CUTANEOUS | Status: DC
  Administered 2017-09-26 – 2017-10-10 (×14): 6 via TOPICAL

## 2017-09-26 MED ORDER — CHLORHEXIDINE GLUCONATE 0.12% ORAL RINSE (MEDLINE KIT)
15.0000 mL | Freq: Two times a day (BID) | OROMUCOSAL | Status: DC
  Administered 2017-09-27 – 2017-10-10 (×28): 15 mL via OROMUCOSAL

## 2017-09-26 MED ORDER — SODIUM CHLORIDE 0.9% FLUSH: 10.0000 mL | INTRAVENOUS | Status: DC | PRN

## 2017-09-26 NOTE — Progress Notes (Signed)
Pharmacist Heart Failure Core Measure Documentation  Assessment: Curtis Keller has an EF documented as 30-35% on 09/22/17 by ECHO.  Rationale: Heart failure patients with left ventricular systolic dysfunction (LVSD) and an EF < 40% should be prescribed an angiotensin converting enzyme inhibitor (ACEI) or angiotensin receptor blocker (ARB) at discharge unless a contraindication is documented in the medical record.  This patient is not currently on an ACEI or ARB for HF.  This note is being placed in the record in order to provide documentation that a contraindication to the use of these agents is present for this encounter.  ACE Inhibitor or Angiotensin Receptor Blocker is contraindicated (specify all that apply)  []   ACEI allergy AND ARB allergy []   Angioedema []   Moderate or severe aortic stenosis []   Hyperkalemia []   Hypotension []   Renal artery stenosis [x]   Worsening renal function, preexisting renal disease or dysfunction  Vinnie Level, PharmD., BCPS Clinical Pharmacist Pager [phone]

## 2017-09-26 NOTE — Progress Notes (Signed)
    [facility] has been requested to perform a transesophageal echocardiogram on Curtis Keller for thrombus.  After careful review of history and examination, the risks and benefits of transesophageal echocardiogram have been explained including risks of esophageal damage, perforation (1:10,000 risk), bleeding, pharyngeal hematoma as well as other potential complications associated with conscious sedation including aspiration, arrhythmia, respiratory failure and death. Alternatives to treatment were discussed, questions were answered. Patient is willing to proceed.   Pt is scheduled for TEE 1400 tomorrow with Dr. Shirlee Latch. NPO at [place].   I spoke with the patient and his sister.  [name], [place]  09/26/2017 3:49 PM

## 2017-09-26 NOTE — Progress Notes (Signed)
eLink Physician-Brief Progress Note Patient Name: Curtis Keller DOB: 04-09-1956 MRN: [id number]   Date of Service  09/26/2017  HPI/Events of Note  Notified by bedside nurse. Patient now hypoxic and unresponsive to sternal rub. Pupils symmetric and forward gaze. Currently on NRB mask. Zanaflex given this evening but no other sedatives. Patient currently on heparin drip.   eICU Interventions  1. Orders placed for Stat Port CXR, ABG, EKG, & Troponin I q6hr x3. 2. Contacted intensivist for bedside assessment & probable intubation 3. Plan for CT head w/o depending on clinical stability      Intervention Category Major Interventions: Change in mental status - evaluation and management  Lawanda Cousins 09/26/2017, 10:52 PM

## 2017-09-26 NOTE — Procedures (Signed)
Intubation Procedure Note Curtis Keller [id number] 12-16-1955  Procedure: Intubation Indications: Respiratory insufficiency  Procedure Details Consent: Unable to obtain consent because of emergent medical necessity. Time Out: Verified patient identification, verified procedure, site/side was marked, verified correct patient position, special equipment/implants available, medications/allergies/relevent history reviewed, required imaging and test results available.  Performed  Maximum sterile technique was used including gloves and mask.  MAC and 3 size 7.5 ETT at 24 cm used from first attempt no difficulty    Evaluation Hemodynamic Status: BP stable throughout; O2 sats: stable throughout Patient's Current Condition: stable Complications: No apparent complications Patient did tolerate procedure well. Chest X-ray ordered to verify placement.  CXR: pending.   Curtis Keller Curtis Keller 09/26/2017

## 2017-09-26 NOTE — Progress Notes (Signed)
Pharmacy Antibiotic Note  Curtis Keller is a 61 y.o. male admitted on 09/20/2017 with pneumonia and MRSA bacteremia.  Pharmacy has been consulted for vancomycin dosing.  Vanc trough slightly above goal.  Plan: This patient's current antibiotics will be continued without adjustments.  Height: 6' (182.9 cm) Weight: 212 lb 11.9 oz (96.5 kg) IBW/kg (Calculated) : 77.6  Temp (24hrs), Avg:98.4 F (36.9 C), Min:97.9 F (36.6 C), Max:98.6 F (37 C)  Recent Labs  Lab 09/20/17 0352  09/20/17 0604  09/21/17 1257 09/22/17 0459 09/23/17 0400 09/24/17 0408 09/25/17 0327 09/26/17 0421  WBC  --    < >  --    < >  --  12.6* 12.0* 8.9 10.7* 12.2*  CREATININE  --   --   --    < >  --  2.59* 2.32* 2.09* 2.08* 1.93*  LATICACIDVEN 6.66*  --  4.63*  --  1.1 0.9  --   --   --   --   VANCOTROUGH  --   --   --   --   --  17  --   --   --  21*   < > = values in this interval not displayed.    Estimated Creatinine Clearance: 47.8 mL/min (A) (by C-G formula based on SCr of 1.93 mg/dL (H)).    Allergies  Allergen Reactions  . Codeine Anaphylaxis and Nausea And Vomiting  . Lisinopril Itching and Rash  . Hydrocodone Other (See Comments)    On MAR  . Hydrocodone-Acetaminophen Nausea And Vomiting    Cannot take any acetaminophen due to liver issues per sister  . Tegaderm Ag Mesh [Silver] Other (See Comments)    Allergy to "silver compounds" listed on MAR  . Tylenol [Acetaminophen] Nausea Only and Other (See Comments)    Cannot take any acetaminophen due to liver issues per sister    Antimicrobials this admission: Vancomycin 11/29 >> Zosyn 11/29 >> 11/30 Flagyl 11/30 >>  Microbiology results: 11/28 BCx: MRSA, Clostridium perfringens 11/28 MRSA PCR positive 11/29 UCx: negative 11/29 BCx:   Thank you for allowing pharmacy to be a part of this patient's care.  Vernard Gambles, PharmD, BCPS  09/26/2017 5:30 AM

## 2017-09-26 NOTE — Progress Notes (Signed)
PICC placement telephone consent obtained. Unable to do the procedure right now, patient is up in chair and per RN patient will be back in bed after an hour.

## 2017-09-26 NOTE — Progress Notes (Signed)
INFECTIOUS DISEASE PROGRESS NOTE  ID: Curtis Keller is a 61 y.o. male with  Active Problems:   Acute systolic (congestive) heart failure (HCC)   Acute respiratory failure with hypoxia (HCC)   Acute hypoxemic respiratory failure (HCC)   Bacteremia  Subjective: Up in the chair. Wants to get up and go for a drive. No complaints.   Abtx:  Anti-infectives (From admission, onward)   Start     Dose/Rate Route Frequency Ordered Stop   09/22/17 1000  metroNIDAZOLE (FLAGYL) IVPB 500 mg     500 mg 100 mL/hr over 60 Minutes Intravenous Every 8 hours 09/22/17 0949     09/21/17 0600  vancomycin (VANCOCIN) IVPB 750 mg/150 ml premix     750 mg 150 mL/hr over 60 Minutes Intravenous Every 24 hours 09/20/17 0730     09/20/17 1400  piperacillin-tazobactam (ZOSYN) IVPB 3.375 g  Status:  Discontinued     3.375 g 12.5 mL/hr over 240 Minutes Intravenous Every 8 hours 09/20/17 0730 09/21/17 1004   09/20/17 0800  vancomycin (VANCOCIN) IVPB 750 mg/150 ml premix     750 mg 150 mL/hr over 60 Minutes Intravenous  Once 09/20/17 0730 09/20/17 2012   09/20/17 0415  ceFEPIme (MAXIPIME) 2 g in dextrose 5 % 50 mL IVPB     2 g 100 mL/hr over 30 Minutes Intravenous  Once 09/20/17 0402 09/20/17 0512   09/20/17 0415  vancomycin (VANCOCIN) IVPB 1000 mg/200 mL premix     1,000 mg 200 mL/hr over 60 Minutes Intravenous  Once 09/20/17 0402 09/20/17 0548      Medications:  Scheduled: . chlorhexidine gluconate (MEDLINE KIT)  15 mL Mouth Rinse BID  . feeding supplement (PRO-STAT SUGAR FREE 64)  30 mL Oral BID  . free water  200 mL Per Tube Q8H  . furosemide  40 mg Intravenous Daily  . insulin aspart  0-15 Units Subcutaneous Q4H  . mouth rinse  15 mL Mouth Rinse QID  . senna-docusate  1 tablet Oral Daily    Objective: Vital signs in last 24 hours: Temp:  [97.9 F (36.6 C)-98.6 F (37 C)] 98.4 F (36.9 C) (12/04 0400) Pulse Rate:  [31-81] 71 (12/04 0800) Resp:  [12-24] 17 (12/04 0800) BP:  (103-125)/(73-91) 118/88 (12/04 0800) SpO2:  [92 %-100 %] 97 % (12/04 0800) Weight:  [212 lb 11.9 oz (96.5 kg)] 212 lb 11.9 oz (96.5 kg) (12/04 0454)  General appearance: alert, no distress and confused  Cardio: regular rate and rhythm and S1, S2 normal GI: normal findings: bowel sounds normal, soft, non-tender and g-tube in place  Extremities: extremities normal, atraumatic, no cyanosis or edema Neurologic: Mental status: alertness: alert, orientation: place  Lab Results Recent Labs    09/25/17 0327 09/26/17 0421  WBC 10.7* 12.2*  HGB 10.2* 10.1*  HCT 32.1* 31.9*  NA 144 142  K 4.3 3.8  CL 112* 106  CO2 25 25  BUN 77* 80*  CREATININE 2.08* 1.93*   Liver Panel No results for input(s): PROT, ALBUMIN, AST, ALT, ALKPHOS, BILITOT, BILIDIR, IBILI in the last 72 hours. Sedimentation Rate No results for input(s): ESRSEDRATE in the last 72 hours. C-Reactive Protein No results for input(s): CRP in the last 72 hours.  Microbiology: Recent Results (from the past 240 hour(s))  Culture, blood (routine x 2)     Status: Abnormal   Collection Time: 09/20/17  3:30 AM  Result Value Ref Range Status   Specimen Description BLOOD RIGHT ANTECUBITAL  Final  Special Requests IN PEDIATRIC BOTTLE Blood Culture adequate volume  Final   Culture  Setup Time   Final    GRAM POSITIVE COCCI IN CLUSTERS IN PEDIATRIC BOTTLE CRITICAL VALUE NOTED.  VALUE IS CONSISTENT WITH PREVIOUSLY REPORTED AND CALLED VALUE.    Culture (A)  Final    STAPHYLOCOCCUS SPECIES (COAGULASE NEGATIVE) THE SIGNIFICANCE OF ISOLATING THIS ORGANISM FROM A SINGLE SET OF BLOOD CULTURES WHEN MULTIPLE SETS ARE DRAWN IS UNCERTAIN. PLEASE NOTIFY THE MICROBIOLOGY DEPARTMENT WITHIN ONE WEEK IF SPECIATION AND SENSITIVITIES ARE REQUIRED.    Report Status 09/22/2017 FINAL  Final  Culture, blood (routine x 2)     Status: Abnormal   Collection Time: 09/20/17  3:40 AM  Result Value Ref Range Status   Specimen Description BLOOD LEFT HAND   Final   Special Requests   Final    BOTTLES DRAWN AEROBIC AND ANAEROBIC Blood Culture adequate volume   Culture  Setup Time   Final    GRAM POSITIVE RODS ANAEROBIC BOTTLE ONLY CRITICAL RESULT CALLED TO, READ BACK BY AND VERIFIED WITH: AMEYER,PHARMD '@1830'$  09/20/17 BY LHOWARD GRAM POSITIVE COCCI IN CLUSTERS AEROBIC BOTTLE ONLY CRITICAL RESULT CALLED TO, READ BACK BY AND VERIFIED WITH: AMEYER.PHARMD '@2100'$  09/20/17 BY LHOWARD    Culture (A)  Final    CLOSTRIDIUM PERFRINGENS METHICILLIN RESISTANT STAPHYLOCOCCUS AUREUS    Report Status 09/22/2017 FINAL  Final   Organism ID, Bacteria METHICILLIN RESISTANT STAPHYLOCOCCUS AUREUS  Final      Susceptibility   Methicillin resistant staphylococcus aureus - MIC*    CIPROFLOXACIN >=8 RESISTANT Resistant     ERYTHROMYCIN >=8 RESISTANT Resistant     GENTAMICIN <=0.5 SENSITIVE Sensitive     OXACILLIN >=4 RESISTANT Resistant     TETRACYCLINE <=1 SENSITIVE Sensitive     VANCOMYCIN 1 SENSITIVE Sensitive     TRIMETH/SULFA <=10 SENSITIVE Sensitive     CLINDAMYCIN RESISTANT Resistant     RIFAMPIN <=0.5 SENSITIVE Sensitive     Inducible Clindamycin POSITIVE Resistant     * METHICILLIN RESISTANT STAPHYLOCOCCUS AUREUS  Blood Culture ID Panel (Reflexed)     Status: None   Collection Time: 09/20/17  3:40 AM  Result Value Ref Range Status   Enterococcus species NOT DETECTED NOT DETECTED Final   Listeria monocytogenes NOT DETECTED NOT DETECTED Final   Staphylococcus species NOT DETECTED NOT DETECTED Final   Staphylococcus aureus NOT DETECTED NOT DETECTED Final   Streptococcus species NOT DETECTED NOT DETECTED Final   Streptococcus agalactiae NOT DETECTED NOT DETECTED Final   Streptococcus pneumoniae NOT DETECTED NOT DETECTED Final   Streptococcus pyogenes NOT DETECTED NOT DETECTED Final   Acinetobacter baumannii NOT DETECTED NOT DETECTED Final   Enterobacteriaceae species NOT DETECTED NOT DETECTED Final   Enterobacter cloacae complex NOT DETECTED NOT  DETECTED Final   Escherichia coli NOT DETECTED NOT DETECTED Final   Klebsiella oxytoca NOT DETECTED NOT DETECTED Final   Klebsiella pneumoniae NOT DETECTED NOT DETECTED Final   Proteus species NOT DETECTED NOT DETECTED Final   Serratia marcescens NOT DETECTED NOT DETECTED Final   Haemophilus influenzae NOT DETECTED NOT DETECTED Final   Neisseria meningitidis NOT DETECTED NOT DETECTED Final   Pseudomonas aeruginosa NOT DETECTED NOT DETECTED Final   Candida albicans NOT DETECTED NOT DETECTED Final   Candida glabrata NOT DETECTED NOT DETECTED Final   Candida krusei NOT DETECTED NOT DETECTED Final   Candida parapsilosis NOT DETECTED NOT DETECTED Final   Candida tropicalis NOT DETECTED NOT DETECTED Final  Blood Culture ID Panel (Reflexed)  Status: Abnormal   Collection Time: 09/20/17  3:40 AM  Result Value Ref Range Status   Enterococcus species NOT DETECTED NOT DETECTED Final   Listeria monocytogenes NOT DETECTED NOT DETECTED Final   Staphylococcus species DETECTED (A) NOT DETECTED Final    Comment: CRITICAL RESULT CALLED TO, READ BACK BY AND VERIFIED WITH: AMEYER,PHARMD '@2100'$  09/20/17 BY LHOWARD    Staphylococcus aureus DETECTED (A) NOT DETECTED Final    Comment: Methicillin (oxacillin)-resistant Staphylococcus aureus (MRSA). MRSA is predictably resistant to beta-lactam antibiotics (except ceftaroline). Preferred therapy is vancomycin unless clinically contraindicated. Patient requires contact precautions if  hospitalized. CRITICAL RESULT CALLED TO, READ BACK BY AND VERIFIED WITH: AMEYER,PHARMD '@2100'$  09/20/17 BY LHOWARD    Methicillin resistance DETECTED (A) NOT DETECTED Final    Comment: CRITICAL RESULT CALLED TO, READ BACK BY AND VERIFIED WITH: AMEYER,PHARMD '@2100'$  09/20/17    Streptococcus species NOT DETECTED NOT DETECTED Final   Streptococcus agalactiae NOT DETECTED NOT DETECTED Final   Streptococcus pneumoniae NOT DETECTED NOT DETECTED Final   Streptococcus pyogenes NOT  DETECTED NOT DETECTED Final   Acinetobacter baumannii NOT DETECTED NOT DETECTED Final   Enterobacteriaceae species NOT DETECTED NOT DETECTED Final   Enterobacter cloacae complex NOT DETECTED NOT DETECTED Final   Escherichia coli NOT DETECTED NOT DETECTED Final   Klebsiella oxytoca NOT DETECTED NOT DETECTED Final   Klebsiella pneumoniae NOT DETECTED NOT DETECTED Final   Proteus species NOT DETECTED NOT DETECTED Final   Serratia marcescens NOT DETECTED NOT DETECTED Final   Haemophilus influenzae NOT DETECTED NOT DETECTED Final   Neisseria meningitidis NOT DETECTED NOT DETECTED Final   Pseudomonas aeruginosa NOT DETECTED NOT DETECTED Final   Candida albicans NOT DETECTED NOT DETECTED Final   Candida glabrata NOT DETECTED NOT DETECTED Final   Candida krusei NOT DETECTED NOT DETECTED Final   Candida parapsilosis NOT DETECTED NOT DETECTED Final   Candida tropicalis NOT DETECTED NOT DETECTED Final  Culture, respiratory (NON-Expectorated)     Status: None   Collection Time: 09/20/17  8:20 AM  Result Value Ref Range Status   Specimen Description TRACHEAL ASPIRATE  Final   Special Requests NONE  Final   Gram Stain   Final    ABUNDANT WBC PRESENT, PREDOMINANTLY PMN RARE SQUAMOUS EPITHELIAL CELLS PRESENT MODERATE GRAM POSITIVE COCCI IN PAIRS    Culture   Final    MODERATE METHICILLIN RESISTANT STAPHYLOCOCCUS AUREUS   Report Status 09/22/2017 FINAL  Final   Organism ID, Bacteria METHICILLIN RESISTANT STAPHYLOCOCCUS AUREUS  Final      Susceptibility   Methicillin resistant staphylococcus aureus - MIC*    CIPROFLOXACIN >=8 RESISTANT Resistant     ERYTHROMYCIN >=8 RESISTANT Resistant     GENTAMICIN <=0.5 SENSITIVE Sensitive     OXACILLIN >=4 RESISTANT Resistant     TETRACYCLINE <=1 SENSITIVE Sensitive     VANCOMYCIN <=0.5 SENSITIVE Sensitive     TRIMETH/SULFA <=10 SENSITIVE Sensitive     CLINDAMYCIN RESISTANT Resistant     RIFAMPIN <=0.5 SENSITIVE Sensitive     Inducible Clindamycin  POSITIVE Resistant     * MODERATE METHICILLIN RESISTANT STAPHYLOCOCCUS AUREUS  MRSA PCR Screening     Status: Abnormal   Collection Time: 09/20/17  9:16 AM  Result Value Ref Range Status   MRSA by PCR POSITIVE (A) NEGATIVE Final    Comment:        The GeneXpert MRSA Assay (FDA approved for NASAL specimens only), is one component of a comprehensive MRSA colonization surveillance program. It is not intended to  diagnose MRSA infection nor to guide or monitor treatment for MRSA infections. RESULT CALLED TO, READ BACK BY AND VERIFIED WITH: Jacolyn Reedy RN 11:20 09/20/17 ([email or web address])   Urine Culture     Status: None   Collection Time: 09/21/17 12:57 PM  Result Value Ref Range Status   Specimen Description URINE, CLEAN CATCH  Final   Special Requests NONE  Final   Culture NO GROWTH  Final   Report Status 09/22/2017 FINAL  Final  Culture, blood (routine x 2)     Status: None (Preliminary result)   Collection Time: 09/22/17  8:20 AM  Result Value Ref Range Status   Specimen Description BLOOD RIGHT HAND  Final   Special Requests IN PEDIATRIC BOTTLE Blood Culture adequate volume  Final   Culture NO GROWTH 3 DAYS  Final   Report Status PENDING  Incomplete  Culture, blood (routine x 2)     Status: None (Preliminary result)   Collection Time: 09/22/17  8:30 AM  Result Value Ref Range Status   Specimen Description BLOOD LEFT HAND  Final   Special Requests IN PEDIATRIC BOTTLE Blood Culture adequate volume  Final   Culture NO GROWTH 3 DAYS  Final   Report Status PENDING  Incomplete    Studies/Results: Dg Chest Port 1 View  Result Date: 09/26/2017 CLINICAL DATA:  Respiratory failure.  Status postextubation EXAM: PORTABLE CHEST 1 VIEW COMPARISON:  September 25, 2017 FINDINGS: Endotracheal tube and nasogastric tube are no longer evident. No pneumothorax. There are pleural effusions bilaterally with extensive interstitial and alveolar opacities, likely diffuse pulmonary edema. There is cardiomegaly  with pulmonary venous hypertension. No adenopathy is appreciable by radiography. There is an old healed fracture of the right clavicle. IMPRESSION: Endotracheal tube and nasogastric tube have been removed. No pneumothorax. Diffuse pulmonary edema with small pleural effusions noted. Underlying pulmonary venous hypertension and cardiomegaly. Suspect congestive heart failure. There may well be a degree of superimposed ARDS. Both entities may exist concurrently. Electronically Signed   By: Lowella Grip III M.D.   On: 09/26/2017 07:52   Dg Chest Port 1 View  Result Date: 09/25/2017 CLINICAL DATA:  Acute respiratory failure. EXAM: PORTABLE CHEST 1 VIEW COMPARISON:  09/24/2017. FINDINGS: Heart is enlarged. Moderately extensive BILATERAL airspace disease which could represent infection or edema is redemonstrated, and not significantly improved. Support tubes and lines are unchanged. There is no pneumothorax. No osseous findings. IMPRESSION: Cardiomegaly with moderately extensive BILATERAL pulmonary opacities which could represent infection or edema, stable. Electronically Signed   By: Staci Righter M.D.   On: 09/25/2017 07:03     Assessment/Plan: MRSA pneumonia VDRF --> extubated to nasal cannula  C perfringens bacteremia (CT A/P without identified source)  Apical mural thrombus CKD3   Total days of antibiotics: 6 vanco, 4 flagyl  Repeat BCx 11-30 are ngtd   OK to place PICC line      Plan for 2 weeks of vancomycin + flagyl pending TEE results He has defined source for his bacteremia (pneumonia) Cr improved; prev 1.65- 1.83 June 2018 Vanc trough 21 --> appreciate pharmacy assistance   Janene Madeira, MSN, NP-C [facility] Cell: [phone] Pager: [phone]  09/26/2017  10:01 AM

## 2017-09-26 NOTE — Evaluation (Signed)
Physical Therapy Evaluation Patient Details Name: Curtis Keller G Bagg MRN: [id number] DOB: Mar 08, 1956 Today's Date: 09/26/2017   History of Present Illness  61 yr old M, SNF resident,  with PMH of COPD, CHF EF 20%, CAD, apical mural thrombus, right CVA June 2018 admitted 11/28 with reports of altered mental status for 2 days which was thought to be due to UTI. He was altered as per his sister for few days and the morning of admit he started having severe shortness of breath saturating in the 50s with minimal improvemnet on CPAP.  He was intubated in the ED which was very difficult as per the ED physician. Please refer to their note. CXR concerning for bilateral infiltrates, treated with cefepime and vancomycin.   Clinical Impression  Pt admitted with above diagnosis. Pt currently with functional limitations due to the deficits listed below (see PT Problem List). Pt  Needed max assist to sit EOB and could not right himself even with cues and asssist initially.  Did progress to sitting with min guard assist for seconds but could not sustain.  Max assist of 2 persons to scoot to drop arm recliner with pad.  Pt with poor postural stability.  Positioned with pillows.     Pt will benefit from skilled PT to increase their independence and safety with mobility to allow discharge to the venue listed below.      Follow Up Recommendations SNF;Supervision/Assistance - 24 hour    Equipment Recommendations  Other (comment)(TBA)    Recommendations for Other Services       Precautions / Restrictions Precautions Precautions: Fall Restrictions Weight Bearing Restrictions: No      Mobility  Bed Mobility Overal bed mobility: Needs Assistance Bed Mobility: Rolling;Sidelying to Sit Rolling: Max assist Sidelying to sit: Max assist       General bed mobility comments: Pt required max assist to c ome to EOB with pt trying to assist but counter productive as he tries to use entire body to move instead of using  essential muscles.    Transfers Overall transfer level: Needs assistance Equipment used: 2 person hand held assist Transfers: Sit to/from [employer]Stand;Squat Pivot Transfers Sit to Stand: Max assist;+2 physical assistance;+2 safety/equipment;From elevated surface   Squat pivot transfers: Max assist;+2 physical assistance;From elevated surface     General transfer comment: Attempted to transfer pt with one person assist but pt could not initiate enough movement and also could not lean onto left hip enough to even move.  Once bed raised and 2 persons in place was able to perform squat pivot with pad to get pt to drop arm recliner.  Pt did weight bear.  Pt did initiate movement but had trouble sustaining movement.  Ambulation/Gait                Stairs            Wheelchair Mobility    Modified Rankin (Stroke Patients Only)       Balance Overall balance assessment: Needs assistance;History of Falls Sitting-balance support: Single extremity supported;Feet supported Sitting balance-Leahy Scale: Poor Sitting balance - Comments: Pt with right lateral lean and anterior needing constant min to mod assist to sit EOB.  LEans right and anterior and impulsive with movement at times.  Overall mod assist for sitting balance due to impulsivity and pooor postural stability.  Postural control: Right lateral lean     Standing balance comment: unable to stand  Pertinent Vitals/Pain Pain Assessment: Faces Faces Pain Scale: Hurts whole lot Pain Location: buttocks and bil heels Pain Descriptors / Indicators: Aching;Grimacing;Guarding Pain Intervention(s): Limited activity within patient's tolerance;Monitored during session  VSS with O2 94% on 4LO2.  120/84.    Home Living Family/patient expects to be discharged to:: Skilled nursing facility                 Additional Comments: [facility]    Prior Function Level of Independence: Needs  assistance   Gait / Transfers Assistance Needed: Per pt he is non ambulatory and stays in bed all day.  ADL's / Homemaking Assistance Needed: tA for ADLs, g tube fed, can wash his face, comb his hair        Hand Dominance   Dominant Hand: Right    Extremity/Trunk Assessment   Upper Extremity Assessment Upper Extremity Assessment: Defer to OT evaluation    Lower Extremity Assessment Lower Extremity Assessment: LLE deficits/detail;RLE deficits/detail RLE Deficits / Details: grossly 3-/5 LLE Deficits / Details: grossly 2/5    Cervical / Trunk Assessment Cervical / Trunk Assessment: Kyphotic(flexed trunk and laterally rotated left)  Communication   Communication: No difficulties  Cognition Arousal/Alertness: Awake/alert Behavior During Therapy: WFL for tasks assessed/performed Overall Cognitive Status: Impaired/Different from baseline Area of Impairment: Safety/judgement;Problem solving;Awareness;Attention;Following commands                   Current Attention Level: Focused   Following Commands: Follows one step commands with increased time Safety/Judgement: Decreased awareness of deficits;Decreased awareness of safety   Problem Solving: Slow processing;Decreased initiation;Difficulty sequencing;Requires verbal cues;Requires tactile cues        General Comments General comments (skin integrity, edema, etc.): Buttock wound per pt and nurse    Exercises General Exercises - Lower Extremity Ankle Circles/Pumps: AROM;5 reps;Both;Supine Heel Slides: AROM;Both;5 reps;Supine   Assessment/Plan    PT Assessment Patient needs continued PT services  PT Problem List Decreased strength;Decreased activity tolerance;Decreased balance;Decreased mobility;Decreased knowledge of use of DME;Decreased safety awareness;Decreased knowledge of precautions;Pain       PT Treatment Interventions DME instruction;Functional mobility training;Therapeutic activities;Therapeutic  exercise;Balance training;Patient/family education;Wheelchair mobility training    PT Goals (Current goals can be found in the Care Plan section)  Acute Rehab PT Goals Patient Stated Goal: to get better PT Goal Formulation: With patient Time For Goal Achievement: 10/10/17 Potential to Achieve Goals: Good    Frequency Min 2X/week   Barriers to discharge Decreased caregiver support      Co-evaluation               AM-PAC PT "6 Clicks" Daily Activity  Outcome Measure Difficulty turning over in bed (including adjusting bedclothes, sheets and blankets)?: Unable Difficulty moving from lying on back to sitting on the side of the bed? : Unable Difficulty sitting down on and standing up from a chair with arms (e.g., wheelchair, bedside commode, etc,.)?: Unable Help needed moving to and from a bed to chair (including a wheelchair)?: Total Help needed walking in hospital room?: Total Help needed climbing 3-5 steps with a railing? : Total 6 Click Score: 6    End of Session Equipment Utilized During Treatment: Gait belt;Oxygen Activity Tolerance: Patient limited by fatigue Patient left: in chair;with call bell/phone within reach;with chair alarm set;with nursing/sitter in room;with restraints reapplied Nurse Communication: Mobility status;Need for lift equipment PT Visit Diagnosis: Unsteadiness on feet (R26.81);Muscle weakness (generalized) (M62.81);Pain Pain - part of body: (buttocks)    Time: 4315-4008 PT Time Calculation (  min) (ACUTE ONLY): 26 min   Charges:   PT Evaluation $PT Eval Moderate Complexity: 1 Mod PT Treatments $Therapeutic Activity: 8-22 mins   PT G Codes:        Zhaniya Swallows,PT Acute Rehabilitation [phone] (pager)   Berline Lopes 09/26/2017, 10:28 AM

## 2017-09-26 NOTE — Progress Notes (Signed)
ANTICOAGULATION CONSULT NOTE  Pharmacy Consult for heparin Indication: h/o apical thrombus  Labs: Recent Labs    09/24/17 0408  09/24/17 2100 09/25/17 0327 09/25/17 0328 09/26/17 0421  HGB 9.7*  --   --  10.2*  --  10.1*  HCT 30.6*  --   --  32.1*  --  31.9*  PLT 169  --   --  198  --  215  HEPARINUNFRC 0.28*   < > 0.52  --  0.57 0.44  CREATININE 2.09*  --   --  2.08*  --  1.93*  TROPONINI 2.69*  --   --   --   --   --    < > = values in this interval not displayed.    Assessment:  61yo male admitted with SOB. Patient on warfarin PTA for h/o of apical mural thrombus. Pharmacy consulted to dose heparin while warfarin on hold.   Heparin level remains therapeutic. Hgb stable at 10.1. Plt wnl. SCr down to 1.93  Plan Continue heparin to 2800 units/hr Daily heparin level and CBC Monitor for s/sx bleeding    Vinnie Level, PharmD., BCPS Clinical Pharmacist Pager [phone]

## 2017-09-26 NOTE — Progress Notes (Signed)
PULMONARY / CRITICAL CARE MEDICINE   Name: Curtis Keller MRN: [id number] DOB: 1956-09-20    ADMISSION DATE:  09/20/2017 CONSULTATION DATE:  10/20/2017  REFERRING MD:  ED  CHIEF COMPLAINT:  Shortness of breath  HISTORY OF PRESENT ILLNESS:   61 yr old M, SNF resident,  with PMH of COPD, CHF EF 20%, CAD, apical mural thrombus admitted 11/28 with reports of altered mental status for 2 days which was thought to be due to UTI. He was altered as per his sister for few days and the morning of admit he started having severe shortness of breath saturating in the 50s with minimal improvemnet on CPAP.  He was intubated in the ED which was very difficult as per the ED physician. Please refer to their note. CXR concerning for bilateral infiltrates, treated with cefepime and vancomycin.    SUBJECTIVE:   RN reports pt asking to get up, needs PICC line for prolonged IV abx.  TEE pending.  No acute events overnight.  Pt remains on heparin gtt.  TF via PEG  VITAL SIGNS: BP (!) 125/91   Pulse 78   Temp 98.4 F (36.9 C) (Oral)   Resp 17   Ht 6' (1.829 m)   Wt 212 lb 11.9 oz (96.5 kg)   SpO2 96%   BMI 28.85 kg/m   HEMODYNAMICS:    VENTILATOR SETTINGS:    INTAKE / OUTPUT: I/O last 3 completed shifts: In: 5028 [I.V.:1348; Other:120; NG/GT:2560; IV Piggyback:1000] Out: 4775 [Urine:4775]  PHYSICAL EXAMINATION: General: chronically ill appearing male in NAD lying in bed HEENT: MM pink/moist, no jvd PSY: occasionally impulsive, tries to get out of bed, easily redirected Neuro: Awake, alert, speech clear, moves spontaneously, does move on L, contracture of LLE CV: s1s2 rrr, no m/r/g PULM: even/non-labored, lungs bilaterally clear ZH:YQMV, non-tender, bsx4 active  Extremities: warm/dry, no edema  Skin: no rashes or lesions, sacral decubitus dressing intact  LABS:  BMET Recent Labs  Lab 09/24/17 0408 09/25/17 0327 09/26/17 0421  NA 142 144 142  K 3.8 4.3 3.8  CL 111 112* 106  CO2 23  25 25   BUN 72* 77* 80*  CREATININE 2.09* 2.08* 1.93*  GLUCOSE 103* 119* 114*    Electrolytes Recent Labs  Lab 09/22/17 0459  09/24/17 0408 09/25/17 0327 09/26/17 0421  CALCIUM 8.6*   < > 8.6* 9.0 8.8*  MG 2.0  --  1.9 2.4 2.2  PHOS 3.3  --  3.7 3.5  --    < > = values in this interval not displayed.    CBC Recent Labs  Lab 09/24/17 0408 09/25/17 0327 09/26/17 0421  WBC 8.9 10.7* 12.2*  HGB 9.7* 10.2* 10.1*  HCT 30.6* 32.1* 31.9*  PLT 169 198 215    Coag's Recent Labs  Lab 09/20/17 0321 09/21/17 0431 09/22/17 0459  INR 1.77 2.13 1.77    Sepsis Markers Recent Labs  Lab 09/20/17 0604 09/21/17 1257 09/22/17 0459  LATICACIDVEN 4.63* 1.1 0.9  PROCALCITON  --  4.14  --     ABG Recent Labs  Lab 09/20/17 1238 09/21/17 0418 09/22/17 0343  PHART 7.364 7.395 7.372  PCO2ART 37.4 37.9 40.0  PO2ART 274* 92.2 114*    Liver Enzymes Recent Labs  Lab 09/21/17 0431 09/22/17 0459  AST 55* 30  ALT 19 16*  ALKPHOS 67 66  BILITOT 0.9 0.5  ALBUMIN 2.2* 2.1*    Cardiac Enzymes Recent Labs  Lab 09/21/17 1257 09/22/17 0459 09/24/17 0408  TROPONINI 8.70* 6.19*  2.69*    Glucose Recent Labs  Lab 09/25/17 0826 09/25/17 1200 09/25/17 1545 09/25/17 2101 09/26/17 0102 09/26/17 0558  GLUCAP 100* 119* 107* 103* 112* 95    Imaging Dg Chest Port 1 View  Result Date: 09/26/2017 CLINICAL DATA:  Respiratory failure.  Status postextubation EXAM: PORTABLE CHEST 1 VIEW COMPARISON:  September 25, 2017 FINDINGS: Endotracheal tube and nasogastric tube are no longer evident. No pneumothorax. There are pleural effusions bilaterally with extensive interstitial and alveolar opacities, likely diffuse pulmonary edema. There is cardiomegaly with pulmonary venous hypertension. No adenopathy is appreciable by radiography. There is an old healed fracture of the right clavicle. IMPRESSION: Endotracheal tube and nasogastric tube have been removed. No pneumothorax. Diffuse pulmonary  edema with small pleural effusions noted. Underlying pulmonary venous hypertension and cardiomegaly. Suspect congestive heart failure. There may well be a degree of superimposed ARDS. Both entities may exist concurrently. Electronically Signed   By: Bretta Bang III M.D.   On: 09/26/2017 07:52   Culture data Sputum culture 11/28 >> MRSA BCx2 11/28 >> MRSA, C perfringens BCx2 11/30 >>   ANTIBIOTICS: Zosyn 11/28 >> 11/29 Vanc 11/28 >> Flagyl 11/30 >>  ASSESSMENT / PLAN: 61 year old SNF resident with multiple medical issues admitted with MRSA and Clostridium bacteremia, & MRSA HCAP.   Acute hypoxemic respiratory failure in the setting of diffuse pulmonary infiltrates  MRSA  pneumonia with ALI  Difficult Intubation  P: Pulmonary hygiene - IS, mobilize O2 as needed to support sats > 90% ABX per ID, plan for ~ 2 weeks therapy Follow intermittent CXR  Septic Shock - volume responsive w/ Clostridium Perfringens and MRSA bacteremia  Systolic HFrEF (20%). Repeat echo shows unchaged EF Elevated troponin, suspect demand ischemia he is not a candidate for intervention; this has trended down  P: Transfer to tele  Cardiology consulted for TEE  Follow repeat cultures  Lasix 40 mg IV QD  History of left ventricular thrombus, on chronic warfarin therapy Anemia  P: Continue heparin gtt per pharmacy  Will convert back to coumadin once timing of TEE determined  Trend CBC   Acute on chronic kidney disease  Mild hyperchloremia  P: Free water 200 ml Q8 Trend BMP / urinary output Replace electrolytes as indicated Avoid nephrotoxic agents, ensure adequate renal perfusion  Metabolic encephalopathy; with history of stroke and left-sided hemiplegia since June 2018.  CT head neg. P: PT consult  Resume home zanaflex at reduced dose  Resume home valium  Constipation Hx of dysphasia; has chronic PEG Hx of Hep C P: TF per Nutrition  Consider SLP eval for swallowing ??  Not sure what  his baseline is > will discuss with daughter on arrival  PEG care per protocol  Pepcid  Senokot, hold for diarrhea   DM P: CBG with SSI   Sacral Decubitus - moisture associated  P: Turn frequently  Foam dressing to sacrum, heels  Space boots for heel protection  WOC following   Hx Mechanical Falls with Prior Hip Fracture P: Monitor closely  Fall precautions   FAMILY  - Updates:  No family available am 12/4 on NP rounds.  Will update on arrival.    - Global:  Transfer to med surg, low bed, fall precautions (will need to be close to nursing station).  To [facility] for am 12/5.  Canary Brim, NP-C  Pulmonary & Critical Care Pgr: [phone] or if no answer [phone] 09/26/2017, 8:23 AM  Attending Note:  62 year male with PMH as above who was  found down in the SNF hypoxemic and noted have MSSA bacteremia and HCAP.  Patient was extubated and is protecting his airway with clear lungs on exam.  I reviewed CXR myself, pulmonary edema noted.  Discussed with PCCM-NP and [facility]-MD.  Acute respiratory failure:  - Monitor for airway protection  - SLP  Dysphagia:  - SLP  - Diet per speech  Hypoxemia:  - Titrate O2 for sat of 88-92%.  - May need arranging home O2.  CHF:  - TEE pending  Acute pulmonary edema:  - Lasix  - Strict I and O  [facility]  Patient seen and examined, agree with above note.  I dictated the care and orders written for this patient under my direction.  Alyson ReedyYacoub, Mazy Culton G, MD [phone]

## 2017-09-26 NOTE — Progress Notes (Signed)
Peripherally Inserted Central Catheter/Midline Placement  The IV Nurse has discussed with the patient and/or persons authorized to consent for the patient, the purpose of this procedure and the potential benefits and risks involved with this procedure.  The benefits include less needle sticks, lab draws from the catheter, and the patient may be discharged home with the catheter. Risks include, but not limited to, infection, bleeding, blood clot (thrombus formation), and puncture of an artery; nerve damage and irregular heartbeat and possibility to perform a PICC exchange if needed/ordered by physician.  Alternatives to this procedure were also discussed.  Bard Power PICC patient education guide, fact sheet on infection prevention and patient information card has been provided to patient /or left at bedside.    PICC/Midline Placement Documentation  PICC Double Lumen 09/26/17 PICC Right Basilic 42 cm 1 cm (Active)  Indication for Insertion or Continuance of Line Prolonged intravenous therapies 09/26/2017  3:36 PM  Exposed Catheter (cm) 1 cm 09/26/2017  3:36 PM  Site Assessment Clean;Dry;Intact 09/26/2017  3:36 PM  Lumen #1 Status Flushed;Saline locked;Blood return noted 09/26/2017  3:36 PM  Lumen #2 Status Flushed;Saline locked;Blood return noted 09/26/2017  3:36 PM  Dressing Type Transparent;Securing device 09/26/2017  3:36 PM  Dressing Status Clean;Dry;Intact;Antimicrobial disc in place 09/26/2017  3:36 PM  Dressing Change Due 10/03/17 09/26/2017  3:36 PM       Romie Jumper 09/26/2017, 3:42 PM

## 2017-09-26 NOTE — Progress Notes (Signed)
Follow-up Nutrition Assessment  DOCUMENTATION CODES:   Not applicable  INTERVENTION:   Change TF formula/regimen:  Jevity 1.2 at 70 ml/h (1680 ml per day)  Pro-stat 30 ml BID  Provides 2216 kcal, 123 gm protein, 1361 ml free water daily  Add liquid MVI daily  NUTRITION DIAGNOSIS:   Inadequate oral intake related to inability to eat as evidenced by NPO status.  Ongoing   GOAL:   Patient will meet greater than or equal to 90% of their needs   Met with TF  MONITOR:   TF tolerance, Diet advancement, Labs, Skin, I & O's  ASSESSMENT:   61 yo male with PMH of COPD, HTN, malnutrition, dysphagia, PEG, dementia, L hemiplegia, CHF, ischemia cardiomyopathy, CAD, HLD, on home oxygen, DM-2, hepatitis C, stroke who was admitted on 11/28 from SNF with acute hypoxic respiratory failure, diffuse pulmonary infiltrates, requiring intubation in the ED.  Discussed patient in ICU rounds and with RN today. Extubated on 12/3. SLP consult has been ordered for swallow evaluation. Patient is currently receiving Vital AF 1.2 via PEG at 60 ml/h (1440 ml/day) with Prostat 30 ml BID to provide 1928 kcals, 138 gm protein, 1168 ml free water daily.  Free water flushes 200 ml every 8 hours Labs reviewed. CBG's: 95-119 Medications reviewed and include Lasix.  Diet Order:  Diet NPO time specified  EDUCATION NEEDS:   No education needs have been identified at this time  Skin:  Skin Assessment: Skin Integrity Issues: Skin Integrity Issues:: Stage I, DTI DTI: bilateral buttocks, sacrum, L heel Stage I: coccyx & heels  Last BM:  11/29  Height:   Ht Readings from Last 1 Encounters:  09/20/17 6' (1.829 m)    Weight:   Wt Readings from Last 1 Encounters:  09/26/17 212 lb 11.9 oz (96.5 kg)    Ideal Body Weight:  80.9 kg  BMI:  Body mass index is 28.85 kg/m.  Estimated Nutritional Needs:   Kcal:  2100-2300  Protein:  110-130 gm  Fluid:  2.1-2.3 L   Molli Barrows, RD, LDN,  CNSC Pager [phone] After Hours Pager [phone]

## 2017-09-27 ENCOUNTER — Encounter (HOSPITAL_COMMUNITY): Payer: Self-pay | Admitting: Certified Registered Nurse Anesthetist

## 2017-09-27 ENCOUNTER — Inpatient Hospital Stay (HOSPITAL_COMMUNITY): Payer: Medicare Other

## 2017-09-27 ENCOUNTER — Encounter (HOSPITAL_COMMUNITY)
Admission: EM | Disposition: A | Discharge status: Skilled Nursing Facility | Payer: Self-pay | Source: Home / Self Care | Attending: Pulmonary Disease

## 2017-09-27 DIAGNOSIS — I340 Nonrheumatic mitral (valve) insufficiency: Secondary | ICD-10-CM

## 2017-09-27 LAB — CBC WITH DIFFERENTIAL/PLATELET
BASOS ABS: 0 10*3/uL (ref 0.0–0.1)
Basophils Relative: 1 %
Eosinophils Absolute: 0.7 10*3/uL (ref 0.0–0.7)
Eosinophils Relative: 9 %
HEMATOCRIT: 29 % — AB (ref 39.0–52.0)
HEMOGLOBIN: 9.4 g/dL — AB (ref 13.0–17.0)
LYMPHS ABS: 2.5 10*3/uL (ref 0.7–4.0)
LYMPHS PCT: 29 %
MCH: 31.9 pg (ref 26.0–34.0)
MCHC: 32.4 g/dL (ref 30.0–36.0)
MCV: 98.3 fL (ref 78.0–100.0)
Monocytes Absolute: 1 10*3/uL (ref 0.1–1.0)
Monocytes Relative: 11 %
NEUTROS ABS: 4.4 10*3/uL (ref 1.7–7.7)
NEUTROS PCT: 50 %
Platelets: 219 10*3/uL (ref 150–400)
RBC: 2.95 MIL/uL — AB (ref 4.22–5.81)
RDW: 14.8 % (ref 11.5–15.5)
WBC: 8.7 10*3/uL (ref 4.0–10.5)

## 2017-09-27 LAB — BLOOD GAS, ARTERIAL
Acid-Base Excess: 1.6 mmol/L (ref 0.0–2.0)
Bicarbonate: 26.6 mmol/L (ref 20.0–28.0)
DRAWN BY: 347621
FIO2: 100
MECHVT: 470 mL
O2 Saturation: 99.4 %
PATIENT TEMPERATURE: 97.6
PEEP: 5 cmH2O
PO2 ART: 215 mmHg — AB (ref 83.0–108.0)
RATE: 14 resp/min
pCO2 arterial: 48.1 mmHg — ABNORMAL HIGH (ref 32.0–48.0)
pH, Arterial: 7.359 (ref 7.350–7.450)

## 2017-09-27 LAB — CULTURE, BLOOD (ROUTINE X 2)
CULTURE: NO GROWTH
CULTURE: NO GROWTH
SPECIAL REQUESTS: ADEQUATE
Special Requests: ADEQUATE

## 2017-09-27 LAB — RENAL FUNCTION PANEL
ANION GAP: 8 (ref 5–15)
Albumin: 2 g/dL — ABNORMAL LOW (ref 3.5–5.0)
BUN: 79 mg/dL — AB (ref 6–20)
CHLORIDE: 108 mmol/L (ref 101–111)
CO2: 26 mmol/L (ref 22–32)
Calcium: 8.6 mg/dL — ABNORMAL LOW (ref 8.9–10.3)
Creatinine, Ser: 1.99 mg/dL — ABNORMAL HIGH (ref 0.61–1.24)
GFR, EST AFRICAN AMERICAN: 40 mL/min — AB (ref >60)
GFR, EST NON AFRICAN AMERICAN: 34 mL/min — AB (ref >60)
Glucose, Bld: 107 mg/dL — ABNORMAL HIGH (ref 65–99)
PHOSPHORUS: 4.6 mg/dL (ref 2.5–4.6)
POTASSIUM: 4 mmol/L (ref 3.5–5.1)
Sodium: 142 mmol/L (ref 135–145)

## 2017-09-27 LAB — GLUCOSE, CAPILLARY
GLUCOSE-CAPILLARY: 112 mg/dL — AB (ref 65–99)
GLUCOSE-CAPILLARY: 125 mg/dL — AB (ref 65–99)
GLUCOSE-CAPILLARY: 134 mg/dL — AB (ref 65–99)
GLUCOSE-CAPILLARY: 154 mg/dL — AB (ref 65–99)
Glucose-Capillary: 110 mg/dL — ABNORMAL HIGH (ref 65–99)
Glucose-Capillary: 139 mg/dL — ABNORMAL HIGH (ref 65–99)
Glucose-Capillary: 154 mg/dL — ABNORMAL HIGH (ref 65–99)

## 2017-09-27 LAB — HEPARIN LEVEL (UNFRACTIONATED): HEPARIN UNFRACTIONATED: 0.44 [IU]/mL (ref 0.30–0.70)

## 2017-09-27 LAB — TROPONIN I
TROPONIN I: 0.48 ng/mL — AB (ref <0.03)
TROPONIN I: 0.29 ng/mL — AB (ref <0.03)
Troponin I: 0.46 ng/mL (ref <0.03)

## 2017-09-27 LAB — CORTISOL: Cortisol, Plasma: 6.8 ug/dL

## 2017-09-27 LAB — MAGNESIUM: Magnesium: 2.2 mg/dL (ref 1.7–2.4)

## 2017-09-27 SURGERY — CANCELLED PROCEDURE

## 2017-09-27 MED ORDER — VITAL AF 1.2 CAL PO LIQD
1000.0000 mL | ORAL | Status: DC
  Administered 2017-09-27 – 2017-09-28 (×2): 1000 mL

## 2017-09-27 MED ORDER — ADULT MULTIVITAMIN LIQUID CH
15.0000 mL | Freq: Every day | ORAL | Status: DC
  Administered 2017-09-27 – 2017-10-10 (×14): 15 mL
  Filled 2017-09-27 (×15): qty 15

## 2017-09-27 MED ORDER — MIDAZOLAM HCL 2 MG/2ML IJ SOLN
1.0000 mg | INTRAMUSCULAR | Status: DC | PRN
  Administered 2017-09-27 – 2017-10-09 (×5): 2 mg via INTRAVENOUS
  Filled 2017-09-27 (×6): qty 2

## 2017-09-27 MED ORDER — SODIUM CHLORIDE 0.9 % IV BOLUS (SEPSIS)
500.0000 mL | Freq: Once | INTRAVENOUS | Status: AC
  Administered 2017-09-27: 500 mL via INTRAVENOUS

## 2017-09-27 MED ORDER — TIZANIDINE HCL 4 MG PO TABS
8.0000 mg | ORAL_TABLET | Freq: Three times a day (TID) | ORAL | Status: DC
  Filled 2017-09-27: qty 2

## 2017-09-27 MED ORDER — SODIUM CHLORIDE 0.9 % IV BOLUS (SEPSIS)
1000.0000 mL | Freq: Once | INTRAVENOUS | Status: AC
  Administered 2017-09-27: 1000 mL via INTRAVENOUS

## 2017-09-27 MED ORDER — PRO-STAT SUGAR FREE PO LIQD
30.0000 mL | Freq: Every day | ORAL | Status: DC
  Administered 2017-09-28 – 2017-10-10 (×13): 30 mL
  Filled 2017-09-27 (×14): qty 30

## 2017-09-27 MED ORDER — SODIUM CHLORIDE 0.9 % IV SOLN
INTRAVENOUS | Status: DC
  Administered 2017-09-27 – 2017-09-29 (×3): via INTRAVENOUS

## 2017-09-27 MED ORDER — NOREPINEPHRINE BITARTRATE 1 MG/ML IV SOLN
0.0000 ug/min | INTRAVENOUS | Status: DC
  Administered 2017-09-27: 2 ug/min via INTRAVENOUS
  Filled 2017-09-27 (×2): qty 4

## 2017-09-27 MED ORDER — SODIUM CHLORIDE 0.9 % IV BOLUS (SEPSIS)
250.0000 mL | Freq: Once | INTRAVENOUS | Status: AC
  Administered 2017-09-26: 250 mL via INTRAVENOUS

## 2017-09-27 MED ORDER — HYDROCORTISONE NA SUCCINATE PF 100 MG IJ SOLR
50.0000 mg | Freq: Four times a day (QID) | INTRAMUSCULAR | Status: DC
  Administered 2017-09-27 – 2017-09-28 (×5): 50 mg via INTRAVENOUS
  Filled 2017-09-27: qty 1
  Filled 2017-09-27: qty 2
  Filled 2017-09-27 (×3): qty 1

## 2017-09-27 MED ORDER — FAMOTIDINE IN NACL 20-0.9 MG/50ML-% IV SOLN
20.0000 mg | Freq: Two times a day (BID) | INTRAVENOUS | Status: DC
  Administered 2017-09-27 – 2017-09-28 (×4): 20 mg via INTRAVENOUS
  Filled 2017-09-27 (×5): qty 50

## 2017-09-27 MED ORDER — DOPAMINE-DEXTROSE 3.2-5 MG/ML-% IV SOLN
0.0000 ug/kg/min | INTRAVENOUS | Status: DC
  Administered 2017-09-27 (×2): 5 ug/kg/min via INTRAVENOUS
  Filled 2017-09-27: qty 250

## 2017-09-27 NOTE — Progress Notes (Signed)
ANTICOAGULATION CONSULT NOTE  Pharmacy Consult for heparin Indication: h/o apical thrombus  Labs: Recent Labs    09/25/17 0327  09/26/17 0421 09/26/17 2325 09/27/17 0213 09/27/17 0508 09/27/17 0707  HGB 10.2*  --  10.1*  --  9.4*  --   --   HCT 32.1*  --  31.9*  --  29.0*  --   --   PLT 198  --  215  --  219  --   --   HEPARINUNFRC  --    < > 0.44  --   --  >2.20* 0.44  CREATININE 2.08*  --  1.93*  --  1.99*  --   --   TROPONINI  --   --   --  0.48* 0.46*  --   --    < > = values in this interval not displayed.    Assessment:  61yo male admitted with SOB. Patient on warfarin PTA for h/o of apical mural thrombus.   Warfarin currently on hold, so patient is being anticoagulated with heparin. Patient was extubated on 12/3, but had to be reintubated overnight.  Patient has been therapeutic at 2800 units/hr   The > 2.2 lvl at 0508 is likely d/t being drawn near infusion and redraw shows good level of 0.44  CBC stable  Plan Continue heparin to 2800 units/hr Daily heparin level and CBC Monitor for s/sx bleeding   Isaac Bliss, PharmD, BCPS, BCCCP Clinical Pharmacist Clinical phone for 09/27/2017 from 7a-3:30p: [phone] If after 3:30p, please call main pharmacy at: [phone] 09/27/2017 7:59 AM

## 2017-09-27 NOTE — Progress Notes (Signed)
eLink Physician-Brief Progress Note Patient Name: Curtis Keller DOB: 06/10/1956 MRN: [id number]   Date of Service  09/27/2017  HPI/Events of Note  Patient remains borderline hypotensive. No significant change in anemia. No leukocytosis. Patient remains afebrile. No significant response to bolus IV fluid. Plan for TEE today at 2 PM.   eICU Interventions  1. Continuing to trend troponin I 2. Discontinuing Lasix 3. Starting dopamine infusion 4. Awaiting serum cortisol      Intervention Category Major Interventions: Hypotension - evaluation and management  Lawanda Cousins 09/27/2017, 3:18 AM

## 2017-09-27 NOTE — H&P (View-Only) (Signed)
PULMONARY / CRITICAL CARE MEDICINE   Name: Curtis Keller MRN: [id number] DOB: 1956/03/27    ADMISSION DATE:  09/20/2017 CONSULTATION DATE:  10/20/2017  REFERRING MD:  ED  CHIEF COMPLAINT:  Shortness of breath  HISTORY OF PRESENT ILLNESS:   61 yr old M, SNF resident,  with PMH of COPD, CHF EF 20%, CAD, apical mural thrombus admitted 11/28 with reports of altered mental status for 2 days which was thought to be due to UTI. He was altered as per his sister for few days and the morning of admit he started having severe shortness of breath saturating in the 50s with minimal improvemnet on CPAP.  He was intubated in the ED which was very difficult as per the ED physician. Please refer to their note. CXR concerning for bilateral infiltrates, treated with cefepime and vancomycin.    SUBJECTIVE:  Pt had episode of unresponsiveness late PM 12/4 (had received evening zanaflex @ reduced dose from home med), heparin held > CT head completed and negative.  Pt intubated.  Heparin gtt restarted.  TEE pending.  RN reports pt medicated with versed for agitation.   VITAL SIGNS: BP 103/80   Pulse 74   Temp 97.9 F (36.6 C) (Oral)   Resp 19   Ht 6' (1.829 m)   Wt 213 lb 6.5 oz (96.8 kg)   SpO2 100%   BMI 28.94 kg/m   HEMODYNAMICS:    VENTILATOR SETTINGS: Vent Mode: PSV;CPAP FiO2 (%):  [30 %-60 %] 40 % Set Rate:  [14 bmp] 14 bmp Vt Set:  [470 mL-620 mL] 620 mL PEEP:  [5 cmH20] 5 cmH20 Pressure Support:  [5 cmH20] 5 cmH20 Plateau Pressure:  [18 cmH20-19 cmH20] 19 cmH20  INTAKE / OUTPUT: I/O last 3 completed shifts: In: 6122.9 [I.V.:1300.9; Other:160; NG/GT:2112; IV Piggyback:2550] Out: 3465 [Urine:3465]  PHYSICAL EXAMINATION: General: adult male in NAD lying in bed  HEENT: MM pink/moist, no jvd, ETT Neuro: sedate CV: s1s2 rrr, no m/r/g PULM: even/non-labored, lungs bilaterally coarse HO:ZYYQ, non-tender, bsx4 active  Extremities: warm/dry, no edema  Skin: no rashes or lesions on  exposed skin.  Stage 2 sacral ulcer.    LABS:  BMET Recent Labs  Lab 09/25/17 0327 09/26/17 0421 09/27/17 0213  NA 144 142 142  K 4.3 3.8 4.0  CL 112* 106 108  CO2 25 25 26   BUN 77* 80* 79*  CREATININE 2.08* 1.93* 1.99*  GLUCOSE 119* 114* 107*    Electrolytes Recent Labs  Lab 09/24/17 0408 09/25/17 0327 09/26/17 0421 09/27/17 0213  CALCIUM 8.6* 9.0 8.8* 8.6*  MG 1.9 2.4 2.2 2.2  PHOS 3.7 3.5  --  4.6    CBC Recent Labs  Lab 09/25/17 0327 09/26/17 0421 09/27/17 0213  WBC 10.7* 12.2* 8.7  HGB 10.2* 10.1* 9.4*  HCT 32.1* 31.9* 29.0*  PLT 198 215 219    Coag's Recent Labs  Lab 09/21/17 0431 09/22/17 0459  INR 2.13 1.77    Sepsis Markers Recent Labs  Lab 09/21/17 1257 09/22/17 0459  LATICACIDVEN 1.1 0.9  PROCALCITON 4.14  --     ABG Recent Labs  Lab 09/21/17 0418 09/22/17 0343 09/26/17 0028  PHART 7.395 7.372 7.359  PCO2ART 37.9 40.0 48.1*  PO2ART 92.2 114* 215*    Liver Enzymes Recent Labs  Lab 09/21/17 0431 09/22/17 0459 09/27/17 0213  AST 55* 30  --   ALT 19 16*  --   ALKPHOS 67 66  --   BILITOT 0.9 0.5  --  ALBUMIN 2.2* 2.1* 2.0*    Cardiac Enzymes Recent Labs  Lab 09/24/17 0408 09/26/17 2325 09/27/17 0213  TROPONINI 2.69* 0.48* 0.46*    Glucose Recent Labs  Lab 09/26/17 0838 09/26/17 1203 09/26/17 1555 09/26/17 2039 09/27/17 0042 09/27/17 0335  GLUCAP 119* 118* 120* 105* 112* 110*    Imaging Ct Head Wo Contrast  Result Date: 09/27/2017 CLINICAL DATA:  61 y/o M; altered mental status. Patient in respiratory distress and intubated. EXAM: CT HEAD WITHOUT CONTRAST TECHNIQUE: Contiguous axial images were obtained from the base of the skull through the vertex without intravenous contrast. COMPARISON:  09/22/2017 CT head. FINDINGS: Brain: No evidence of acute infarction, hemorrhage, hydrocephalus, extra-axial collection or mass lesion/mass effect. Stable chronic microvascular ischemic changes, multiple chronic  infarcts within bilateral cerebellar hemispheres, right occipital lobe, left anterior temporal lobe, pons, and bilateral basal ganglia. Stable brain parenchymal volume loss. Vascular: Calcific atherosclerosis of carotid siphons. No hyperdense vessel identified. Skull: Normal. Negative for fracture or focal lesion. Sinuses/Orbits: Stable right partial mastoid opacification. Small bilateral maxillary sinus mucous retention cyst. Otherwise negative. Other: None. IMPRESSION: 1. No acute intracranial abnormality. 2. Stable chronic microvascular ischemic changes, multiple chronic infarctions, and brain parenchymal volume loss. Electronically Signed   By: Mitzi Hansen M.D.   On: 09/27/2017 00:44   Dg Chest Port 1 View  Result Date: 09/27/2017 CLINICAL DATA:  Follow-up pneumonia EXAM: PORTABLE CHEST 1 VIEW COMPARISON:  09/26/2017 FINDINGS: Endotracheal tube is again noted in satisfactory position. Right-sided PICC line is stable in appearance. Cardiac shadow remains enlarged. Diffuse bilateral infiltrates left greater than right are noted. Left-sided pleural effusion is noted as well. No acute bony abnormality is seen. IMPRESSION: No significant interval change from the prior exam. Electronically Signed   By: Alcide Clever M.D.   On: 09/27/2017 07:15   Dg Chest Port 1 View  Result Date: 09/27/2017 CLINICAL DATA:  Acute respiratory failure. EXAM: PORTABLE CHEST 1 VIEW COMPARISON:  09/26/2017 FINDINGS: Post intubation. Endotracheal tube terminates 2.3 cm above the carina. Right PICC line terminates at the cavoatrial junction. The cardiac silhouette is partially obscured by dense bilateral alveolar and interstitial opacities with central predominance. More confluent airspace consolidation the left lower lobe cannot be excluded. There are probably bilateral pleural effusions. No pneumothorax. Osseous structures are without acute abnormality. Soft tissues are grossly normal. IMPRESSION: Post intubation.  Significant worsening of the aeration of the lungs with mixed pattern pulmonary edema and bilateral pleural effusions. More confluent airspace consolidation in the left lower lobe cannot be excluded. Electronically Signed   By: Ted Mcalpine M.D.   On: 09/27/2017 00:14   Culture data Sputum culture 11/28 >> MRSA BCx2 11/28 >> MRSA, C perfringens BCx2 11/30 >>   ANTIBIOTICS: Zosyn 11/28 >> 11/29 Vanc 11/28 >> Flagyl 11/30 >>   ASSESSMENT / PLAN: 61 year old SNF resident with multiple medical issues admitted with MRSA and Clostridium bacteremia, & MRSA HCAP.   Acute hypoxemic respiratory failure in the setting of diffuse pulmonary infiltrates  MRSA pneumonia with ALI  Difficult Intubation  Left Pleural Effusion  P: PRVC 8 cc/kg  Wean PEEP / FiO2 for sats > 90% PSV as tolerated  No extubation until TEE completed  ABX per ID, plan for ~ 2 wks therapy  Follow intermittent CXR Suspect re-intubation sedation related Consider [place] assessment of left chest vs CT   Septic Shock - volume responsive w/ Clostridium Perfringens and MRSA bacteremia  Systolic HFrEF (20%). Repeat echo shows unchaged EF Elevated troponin, suspect demand ischemia  he is not a candidate for intervention; this has trended down  P: ICU monitoring  Wean dopamine to off as tolerated, MAP > 65 Gentle IVF > NS @ 75ml/hr Trend troponin  Pending TEE per Cardiology  Hold further lasix  Follow cultures   History of l37eft ventricular thrombus, on chronic warfarin therapy Anemia  P: Heparin gtt per pharmacy  Convert back to coumadin once TEE completed  Trend CBC   Acute on chronic kidney disease  Mild hyperchloremia  P: Free water 200 ml Q8  Trend BMP / urinary output Replace electrolytes as indicated Avoid nephrotoxic agents, ensure adequate renal perfusion  Metabolic encephalopathy; with history of stroke and left-sided hemiplegia since June 2018.  CT head neg. P: PT consult  Minimize sedation as able   Stop home zanaflex > if restarted would reduce to lowest minimum dose  Hold home valium > low dose but monitor for withdrawal.  Is getting PRN benzos for sedation  Constipation Hx of dysphasia; has chronic PEG, tolerates soft diet at SNF Hx of Hep C P: TF per Nutrition  SLP once extubated  PEG care per protocol  Pepcid for SUP Senokot, hold for diarrhea   DM P: CBG with SSI   Sacral Decubitus - moisture associated  P: Frequent turning  Foam dressing to sacrum, heels  Space boots for heel protection  WOC following   Hx Mechanical Falls with Prior Hip Fracture P: Monitor  Fall precautions  PT efforts   FAMILY  - Updates:  Sister updated on am rounding 12/5.    - Global:  ICU care.  Await TEE.  Appreciate Cardiology assistance.    Canary BrimBrandi Ollis, NP-C [facility] Pgr: [phone] or if no answer [phone] 09/27/2017, 8:33 AM  Attending Note:  61 year old male with respiratory failure due to what I suspect is unintentional drug overdose that was noted to have MSSA bacteremia.  Patient was given his second dose of muscle relaxant and again became unresponsive and got intubated overnight.  He is awake and weaning well today on exam.  I reviewed CXR myself, ETT is in good position.  He is scheduled to get a TEE at 1 PM today then will consider extubating.  Failed SLP, will need another post intubation.  Spoke with sister, if patient is extubated and fails again then will need trach and she wishes for that.  D/C all sedating medications.  Hope is that post extubation the patient will be awake enough to d/c pressors.  The patient is critically ill with multiple organ systems failure and requires high complexity decision making for assessment and support, frequent evaluation and titration of therapies, application of advanced monitoring technologies and extensive interpretation of multiple databases.   Critical Care Time devoted to patient care services described  in this note is  35  Minutes. This time reflects time of care of this signee Dr Koren BoundWesam Izzak Fries. This critical care time does not reflect procedure time, or teaching time or supervisory time of PA/NP/Med student/Med Resident etc but could involve care discussion time.  Alyson ReedyWesam G. Ladarien Beeks, M.D. [facility]. Pager: [phone]. After hours pager: [phone].

## 2017-09-27 NOTE — CV Procedure (Signed)
Procedure: TEE  Sedation: Versed 2 mg IV, Fentanyl 50 mcg IV  Indication: Bacteremia  Findings: Please see echo section for full report.  Mildly dilated LV with diffuse hypokinesis, EF 20-25%.  There may be a small calcified LV thrombus at the apex.  The right ventricle was normal in size with mildly decreased systolic function.  Mild to moderate LA dilation, no LA appendage thrombus.  Mild RA dilation.  Trivial TR, no TV vegetation.  Peak RV-RA gradient 28 mmHg.  Mild mitral regurgitation, no MV vegetation.  No PV vegetation.  Trileaflet aortic valve, no stenosis or regurgitation.  No TV vegetation.  Normal caliber aorta with mild plaque in the descending thoracic aorta.   No evidence for endocarditis.   Marca Ancona 09/27/2017 1:13 PM

## 2017-09-27 NOTE — Progress Notes (Signed)
OT Cancellation Note  Patient Details Name: Curtis Keller MRN: [id number] DOB: [birth year]   Cancelled Treatment:    Reason Eval/Treat Not Completed: Medical issues which prohibited therapy. Pt intubated, spoke with RN, not medically ready for OT eval. Will continue to follow for medical appropriateness.   Evern Bio Yarah Fuente 09/27/2017, 9:59 AM  Sherryl Manges OTR/L [phone]

## 2017-09-27 NOTE — Progress Notes (Signed)
SLP Cancellation Note  Patient Details Name: ABDON MOELLER MRN: [id number] DOB: [birth year]   Cancelled treatment:       Reason Eval/Treat Not Completed: Medical issues which prohibited therapy - pt now intubated. Will sign off. Please reorder when ready.   Maxcine Ham 09/27/2017, 8:33 AM  Maxcine Ham, M.A. CCC-SLP [phone]

## 2017-09-27 NOTE — Progress Notes (Signed)
  Echocardiogram Echocardiogram Transesophageal has been performed.  Leta Jungling M 09/27/2017, 1:50 PM

## 2017-09-27 NOTE — Progress Notes (Signed)
2250: Pt with O2 sats 83% on 4L [place]. Placed on NRB with an increase to 90%. Pt unresponsive to sternal rub. Pupils 4+ and sluggish bilaterally. E-link notified of pt change in status. Ordered to stop heparin gtt and will have Md come see pt.   2310: Dr Minette Headland at bedside. 20mg  of Etomidate and 2mg  of versed given and pt intubated with no complications. bolus given for hypotension.   0030: Pt returned from Head CT. BP continues to be low, E-link notified. bolus NS ordered and PRN Versed for agitation. Will continue to monitor pt status.

## 2017-09-27 NOTE — Progress Notes (Signed)
eLink Physician-Brief Progress Note Patient Name: Curtis Keller DOB: 12/07/1955 MRN: [id number]   Date of Service  09/27/2017  HPI/Events of Note  Postintubation portable chest x-ray reveals endotracheal tube in good position. CT head without obvious bleed or new abnormality. Troponin appears still downward trending. Patient has had hypotension post-sedation for intubation and bolus IV fentanyl. After intubation patient was sitting up in bed moving all 4 extremities and agitated. Patient was not following commands.   eICU Interventions  1. Holding on further bolus fentanyl 2. Percent IV when necessary sedation or agitation 3. Normal saline 1 L IV bolus 4. Confirming blood pressure with manual cuff 5. Continuing to trend troponin I 6. Restarting heparin drip 7. Checking stat CBC as well as other morning labs 8. Checking serum cortisol      Intervention Category Major Interventions: Hypotension - evaluation and management;Change in mental status - evaluation and management  Lawanda Cousins 09/27/2017, 2:08 AM

## 2017-09-27 NOTE — Progress Notes (Signed)
Follow-up Nutrition Assessment  DOCUMENTATION CODES:   Not applicable  INTERVENTION:   Change TF formula/regimen:  Vital AF 1.2 at 65 ml/h (1560 ml per day)  Pro-stat 30 ml once daily  Provides 1972 kcal, 132 gm protein, 1265 ml free water daily  Liquid MVI daily  NUTRITION DIAGNOSIS:   Inadequate oral intake related to inability to eat as evidenced by NPO status.  Ongoing   GOAL:   Patient will meet greater than or equal to 90% of their needs   Met with TF  MONITOR:   Vent status, TF tolerance, Labs, I & O's, Skin  ASSESSMENT:   61 yo male with PMH of COPD, HTN, malnutrition, dysphagia, PEG, dementia, L hemiplegia, CHF, ischemia cardiomyopathy, CAD, HLD, on home oxygen, DM-2, hepatitis C, stroke who was admitted on 11/28 from SNF with acute hypoxic respiratory failure, diffuse pulmonary infiltrates, requiring intubation in the ED.  Discussed patient in ICU rounds and with RN today. Extubated on 12/3, required re-intubation 12/4. TF currently off for TEE. To be resumed after procedure. Free water flushes 200 ml every 8 hours Patient is currently intubated on ventilator support MV: 8.3 L/min Temp (24hrs), Avg:98.2 F (36.8 C), Min:97.6 F (36.4 C), Max:98.6 F (37 C)  Labs reviewed. CBG's: [phone] Medications reviewed.  Diet Order:  Diet NPO time specified  EDUCATION NEEDS:   No education needs have been identified at this time  Skin:  Skin Assessment: Skin Integrity Issues: Skin Integrity Issues:: Stage I, DTI DTI: bilateral buttocks, sacrum, L heel Stage I: coccyx & heels  Last BM:  12/5 type 1  Height:   Ht Readings from Last 1 Encounters:  09/27/17 6' (1.829 m)    Weight:   Wt Readings from Last 1 Encounters:  09/27/17 213 lb 6.5 oz (96.8 kg)    Ideal Body Weight:  80.9 kg  BMI:  Body mass index is 28.94 kg/m.  Estimated Nutritional Needs:   Kcal:  1960  Protein:  120-140 gm  Fluid:  2 L   Molli Barrows, RD, LDN,  [place] Pager [phone] After Hours Pager [phone]

## 2017-09-27 NOTE — Progress Notes (Signed)
PULMONARY / CRITICAL CARE MEDICINE   Name: Curtis Keller MRN: [id number] DOB: 1956/03/27    ADMISSION DATE:  09/20/2017 CONSULTATION DATE:  10/20/2017  REFERRING MD:  ED  CHIEF COMPLAINT:  Shortness of breath  HISTORY OF PRESENT ILLNESS:   61 yr old M, SNF resident,  with PMH of COPD, CHF EF 20%, CAD, apical mural thrombus admitted 11/28 with reports of altered mental status for 2 days which was thought to be due to UTI. He was altered as per his sister for few days and the morning of admit he started having severe shortness of breath saturating in the 50s with minimal improvemnet on CPAP.  He was intubated in the ED which was very difficult as per the ED physician. Please refer to their note. CXR concerning for bilateral infiltrates, treated with cefepime and vancomycin.    SUBJECTIVE:  Pt had episode of unresponsiveness late PM 12/4 (had received evening zanaflex @ reduced dose from home med), heparin held > CT head completed and negative.  Pt intubated.  Heparin gtt restarted.  TEE pending.  RN reports pt medicated with versed for agitation.   VITAL SIGNS: BP 103/80   Pulse 74   Temp 97.9 F (36.6 C) (Oral)   Resp 19   Ht 6' (1.829 m)   Wt 213 lb 6.5 oz (96.8 kg)   SpO2 100%   BMI 28.94 kg/m   HEMODYNAMICS:    VENTILATOR SETTINGS: Vent Mode: PSV;CPAP FiO2 (%):  [30 %-60 %] 40 % Set Rate:  [14 bmp] 14 bmp Vt Set:  [470 mL-620 mL] 620 mL PEEP:  [5 cmH20] 5 cmH20 Pressure Support:  [5 cmH20] 5 cmH20 Plateau Pressure:  [18 cmH20-19 cmH20] 19 cmH20  INTAKE / OUTPUT: I/O last 3 completed shifts: In: 6122.9 [I.V.:1300.9; Other:160; NG/GT:2112; IV Piggyback:2550] Out: 3465 [Urine:3465]  PHYSICAL EXAMINATION: General: adult male in NAD lying in bed  HEENT: MM pink/moist, no jvd, ETT Neuro: sedate CV: s1s2 rrr, no m/r/g PULM: even/non-labored, lungs bilaterally coarse HO:ZYYQ, non-tender, bsx4 active  Extremities: warm/dry, no edema  Skin: no rashes or lesions on  exposed skin.  Stage 2 sacral ulcer.    LABS:  BMET Recent Labs  Lab 09/25/17 0327 09/26/17 0421 09/27/17 0213  NA 144 142 142  K 4.3 3.8 4.0  CL 112* 106 108  CO2 25 25 26   BUN 77* 80* 79*  CREATININE 2.08* 1.93* 1.99*  GLUCOSE 119* 114* 107*    Electrolytes Recent Labs  Lab 09/24/17 0408 09/25/17 0327 09/26/17 0421 09/27/17 0213  CALCIUM 8.6* 9.0 8.8* 8.6*  MG 1.9 2.4 2.2 2.2  PHOS 3.7 3.5  --  4.6    CBC Recent Labs  Lab 09/25/17 0327 09/26/17 0421 09/27/17 0213  WBC 10.7* 12.2* 8.7  HGB 10.2* 10.1* 9.4*  HCT 32.1* 31.9* 29.0*  PLT 198 215 219    Coag's Recent Labs  Lab 09/21/17 0431 09/22/17 0459  INR 2.13 1.77    Sepsis Markers Recent Labs  Lab 09/21/17 1257 09/22/17 0459  LATICACIDVEN 1.1 0.9  PROCALCITON 4.14  --     ABG Recent Labs  Lab 09/21/17 0418 09/22/17 0343 09/26/17 0028  PHART 7.395 7.372 7.359  PCO2ART 37.9 40.0 48.1*  PO2ART 92.2 114* 215*    Liver Enzymes Recent Labs  Lab 09/21/17 0431 09/22/17 0459 09/27/17 0213  AST 55* 30  --   ALT 19 16*  --   ALKPHOS 67 66  --   BILITOT 0.9 0.5  --  ALBUMIN 2.2* 2.1* 2.0*    Cardiac Enzymes Recent Labs  Lab 09/24/17 0408 09/26/17 2325 09/27/17 0213  TROPONINI 2.69* 0.48* 0.46*    Glucose Recent Labs  Lab 09/26/17 0838 09/26/17 1203 09/26/17 1555 09/26/17 2039 09/27/17 0042 09/27/17 0335  GLUCAP 119* 118* 120* 105* 112* 110*    Imaging Ct Head Wo Contrast  Result Date: 09/27/2017 CLINICAL DATA:  61 y/o M; altered mental status. Patient in respiratory distress and intubated. EXAM: CT HEAD WITHOUT CONTRAST TECHNIQUE: Contiguous axial images were obtained from the base of the skull through the vertex without intravenous contrast. COMPARISON:  09/22/2017 CT head. FINDINGS: Brain: No evidence of acute infarction, hemorrhage, hydrocephalus, extra-axial collection or mass lesion/mass effect. Stable chronic microvascular ischemic changes, multiple chronic  infarcts within bilateral cerebellar hemispheres, right occipital lobe, left anterior temporal lobe, pons, and bilateral basal ganglia. Stable brain parenchymal volume loss. Vascular: Calcific atherosclerosis of carotid siphons. No hyperdense vessel identified. Skull: Normal. Negative for fracture or focal lesion. Sinuses/Orbits: Stable right partial mastoid opacification. Small bilateral maxillary sinus mucous retention cyst. Otherwise negative. Other: None. IMPRESSION: 1. No acute intracranial abnormality. 2. Stable chronic microvascular ischemic changes, multiple chronic infarctions, and brain parenchymal volume loss. Electronically Signed   By: Mitzi Hansen M.D.   On: 09/27/2017 00:44   Dg Chest Port 1 View  Result Date: 09/27/2017 CLINICAL DATA:  Follow-up pneumonia EXAM: PORTABLE CHEST 1 VIEW COMPARISON:  09/26/2017 FINDINGS: Endotracheal tube is again noted in satisfactory position. Right-sided PICC line is stable in appearance. Cardiac shadow remains enlarged. Diffuse bilateral infiltrates left greater than right are noted. Left-sided pleural effusion is noted as well. No acute bony abnormality is seen. IMPRESSION: No significant interval change from the prior exam. Electronically Signed   By: Alcide Clever M.D.   On: 09/27/2017 07:15   Dg Chest Port 1 View  Result Date: 09/27/2017 CLINICAL DATA:  Acute respiratory failure. EXAM: PORTABLE CHEST 1 VIEW COMPARISON:  09/26/2017 FINDINGS: Post intubation. Endotracheal tube terminates 2.3 cm above the carina. Right PICC line terminates at the cavoatrial junction. The cardiac silhouette is partially obscured by dense bilateral alveolar and interstitial opacities with central predominance. More confluent airspace consolidation the left lower lobe cannot be excluded. There are probably bilateral pleural effusions. No pneumothorax. Osseous structures are without acute abnormality. Soft tissues are grossly normal. IMPRESSION: Post intubation.  Significant worsening of the aeration of the lungs with mixed pattern pulmonary edema and bilateral pleural effusions. More confluent airspace consolidation in the left lower lobe cannot be excluded. Electronically Signed   By: Ted Mcalpine M.D.   On: 09/27/2017 00:14   Culture data Sputum culture 11/28 >> MRSA BCx2 11/28 >> MRSA, C perfringens BCx2 11/30 >>   ANTIBIOTICS: Zosyn 11/28 >> 11/29 Vanc 11/28 >> Flagyl 11/30 >>   ASSESSMENT / PLAN: 61 year old SNF resident with multiple medical issues admitted with MRSA and Clostridium bacteremia, & MRSA HCAP.   Acute hypoxemic respiratory failure in the setting of diffuse pulmonary infiltrates  MRSA pneumonia with ALI  Difficult Intubation  Left Pleural Effusion  P: PRVC 8 cc/kg  Wean PEEP / FiO2 for sats > 90% PSV as tolerated  No extubation until TEE completed  ABX per ID, plan for ~ 2 wks therapy  Follow intermittent CXR Suspect re-intubation sedation related Consider [place] assessment of left chest vs CT   Septic Shock - volume responsive w/ Clostridium Perfringens and MRSA bacteremia  Systolic HFrEF (20%). Repeat echo shows unchaged EF Elevated troponin, suspect demand ischemia  he is not a candidate for intervention; this has trended down  P: ICU monitoring  Wean dopamine to off as tolerated, MAP > 65 Gentle IVF > NS @ 75ml/hr Trend troponin  Pending TEE per Cardiology  Hold further lasix  Follow cultures   History of l37eft ventricular thrombus, on chronic warfarin therapy Anemia  P: Heparin gtt per pharmacy  Convert back to coumadin once TEE completed  Trend CBC   Acute on chronic kidney disease  Mild hyperchloremia  P: Free water 200 ml Q8  Trend BMP / urinary output Replace electrolytes as indicated Avoid nephrotoxic agents, ensure adequate renal perfusion  Metabolic encephalopathy; with history of stroke and left-sided hemiplegia since June 2018.  CT head neg. P: PT consult  Minimize sedation as able   Stop home zanaflex > if restarted would reduce to lowest minimum dose  Hold home valium > low dose but monitor for withdrawal.  Is getting PRN benzos for sedation  Constipation Hx of dysphasia; has chronic PEG, tolerates soft diet at SNF Hx of Hep C P: TF per Nutrition  SLP once extubated  PEG care per protocol  Pepcid for SUP Senokot, hold for diarrhea   DM P: CBG with SSI   Sacral Decubitus - moisture associated  P: Frequent turning  Foam dressing to sacrum, heels  Space boots for heel protection  WOC following   Hx Mechanical Falls with Prior Hip Fracture P: Monitor  Fall precautions  PT efforts   FAMILY  - Updates:  Sister updated on am rounding 12/5.    - Global:  ICU care.  Await TEE.  Appreciate Cardiology assistance.    Canary BrimBrandi Ollis, NP-C [facility] Pgr: [phone] or if no answer [phone] 09/27/2017, 8:33 AM  Attending Note:  61 year old male with respiratory failure due to what I suspect is unintentional drug overdose that was noted to have MSSA bacteremia.  Patient was given his second dose of muscle relaxant and again became unresponsive and got intubated overnight.  He is awake and weaning well today on exam.  I reviewed CXR myself, ETT is in good position.  He is scheduled to get a TEE at 1 PM today then will consider extubating.  Failed SLP, will need another post intubation.  Spoke with sister, if patient is extubated and fails again then will need trach and she wishes for that.  D/C all sedating medications.  Hope is that post extubation the patient will be awake enough to d/c pressors.  The patient is critically ill with multiple organ systems failure and requires high complexity decision making for assessment and support, frequent evaluation and titration of therapies, application of advanced monitoring technologies and extensive interpretation of multiple databases.   Critical Care Time devoted to patient care services described  in this note is  35  Minutes. This time reflects time of care of this signee Dr Koren BoundWesam Yacoub. This critical care time does not reflect procedure time, or teaching time or supervisory time of PA/NP/Med student/Med Resident etc but could involve care discussion time.  Alyson ReedyWesam G. Yacoub, M.D. [facility]. Pager: [phone]. After hours pager: [phone].

## 2017-09-27 NOTE — Interval H&P Note (Signed)
History and Physical Interval Note:  09/27/2017 12:54 PM  Curtis Keller  has presented today for surgery, with the diagnosis of LV THROMBUS  The various methods of treatment have been discussed with the patient and family. After consideration of risks, benefits and other options for treatment, the patient has consented to  Procedure(s): TRANSESOPHAGEAL ECHOCARDIOGRAM (TEE) (N/A) as a surgical intervention .  The patient's history has been reviewed, patient examined, no change in status, stable for surgery.  I have reviewed the patient's chart and labs.  Questions were answered to the patient's satisfaction.     Makell Drohan [name]

## 2017-09-27 NOTE — Progress Notes (Signed)
INFECTIOUS DISEASE PROGRESS NOTE  ID: Curtis Keller is a 61 y.o. male with  Active Problems:   Acute respiratory failure ([place])   Acute systolic (congestive) heart failure (H[facility])   Acute respiratory failure with hypoxia (H[facility])   Acute hypoxemic respiratory failure (H[facility])   Bacteremia   Acute encephalopathy  Subjective: On vent, dopamine  Abtx:  Anti-infectives (From admission, onward)   Start     Dose/Rate Route Frequency Ordered Stop   09/22/17 1000  metroNIDAZOLE (FLAGYL) IVPB 500 mg     500 mg 100 mL/hr over 60 Minutes Intravenous Every 8 hours 09/22/17 0949     09/21/17 0600  vancomycin (VANCOCIN) IVPB 750 mg/150 ml premix     750 mg 150 mL/hr over 60 Minutes Intravenous Every 24 hours 09/20/17 0730     09/20/17 1400  piperacillin-tazobactam (ZOSYN) IVPB 3.375 g  Status:  Discontinued     3.375 g 12.5 mL/hr over 240 Minutes Intravenous Every 8 hours 09/20/17 0730 09/21/17 1004   09/20/17 0800  vancomycin (VANCOCIN) IVPB 750 mg/150 ml premix     750 mg 150 mL/hr over 60 Minutes Intravenous  Once 09/20/17 0730 09/20/17 2012   09/20/17 0415  ceFEPIme (MAXIPIME) 2 g in dextrose 5 % 50 mL IVPB     2 g 100 mL/hr over 30 Minutes Intravenous  Once 09/20/17 0402 09/20/17 0512   09/20/17 0415  vancomycin (VANCOCIN) IVPB 1000 mg/200 mL premix     1,000 mg 200 mL/hr over 60 Minutes Intravenous  Once 09/20/17 0402 09/20/17 0548      Medications:  Scheduled: . atorvastatin  40 mg Per Tube QHS  . chlorhexidine gluconate (MEDLINE KIT)  15 mL Mouth Rinse BID  . Chlorhexidine Gluconate Cloth  6 each Topical Daily  . diazepam  2.5 mg Oral [id number]  . feeding supplement (PRO-STAT SUGAR FREE 64)  30 mL Per Tube BID  . free water  200 mL Per Tube Q8H  . hydrocortisone sod succinate (SOLU-CORTEF) inj  50 mg Intravenous Q6H  . insulin aspart  0-15 Units Subcutaneous Q4H  . mouth rinse  15 mL Mouth Rinse QID  . senna-docusate  1 tablet Oral Daily  . sodium chloride flush  10-40 mL  Intracatheter Q12H  . tiZANidine  12 mg Per Tube TID    Objective: Vital signs in last 24 hours: Temp:  [97.6 F (36.4 C)-98.4 F (36.9 C)] 97.9 F (36.6 C) (12/05 0000) Pulse Rate:  [37-99] 74 (12/05 0746) Resp:  [14-25] 19 (12/05 0746) BP: (59-131)/(31-90) 103/80 (12/05 0700) SpO2:  [86 %-100 %] 100 % (12/05 0746) Arterial Line BP: (73-127)/(45-73) 120/69 (12/05 0700) FiO2 (%):  [30 %-60 %] 40 % (12/05 0746) Weight:  [96.8 kg (213 lb 6.5 oz)] 96.8 kg (213 lb 6.5 oz) (12/05 0430)   General appearance: no distress Resp: rhonchi anterior - bilateral Cardio: irregularly irregular rhythm GI: normal findings: bowel sounds normal and abnormal findings:  distended Extremities: edema none BLE  Lab Results Recent Labs    09/26/17 0421 09/27/17 0213  WBC 12.2* 8.7  HGB 10.1* 9.4*  HCT 31.9* 29.0*  NA 142 142  K 3.8 4.0  CL 106 108  CO2 25 26  BUN 80* 79*  CREATININE 1.93* 1.99*   Liver Panel Recent Labs    09/27/17 0213  ALBUMIN 2.0*   Sedimentation Rate No results for input(s): ESRSEDRATE in the last 72 hours. C-Reactive Protein No results for input(s): CRP in the last 72 hours.  Microbiology:  Recent Results (from the past 240 hour(s))  Culture, blood (routine x 2)     Status: Abnormal   Collection Time: 09/20/17  3:30 AM  Result Value Ref Range Status   Specimen Description BLOOD RIGHT ANTECUBITAL  Final   Special Requests IN PEDIATRIC BOTTLE Blood Culture adequate volume  Final   Culture  Setup Time   Final    GRAM POSITIVE COCCI IN CLUSTERS IN PEDIATRIC BOTTLE CRITICAL VALUE NOTED.  VALUE IS CONSISTENT WITH PREVIOUSLY REPORTED AND CALLED VALUE.    Culture (A)  Final    STAPHYLOCOCCUS SPECIES (COAGULASE NEGATIVE) THE SIGNIFICANCE OF ISOLATING THIS ORGANISM FROM A SINGLE SET OF BLOOD CULTURES WHEN MULTIPLE SETS ARE DRAWN IS UNCERTAIN. PLEASE NOTIFY THE MICROBIOLOGY DEPARTMENT WITHIN ONE WEEK IF SPECIATION AND SENSITIVITIES ARE REQUIRED.    Report Status  09/22/2017 FINAL  Final  Culture, blood (routine x 2)     Status: Abnormal   Collection Time: 09/20/17  3:40 AM  Result Value Ref Range Status   Specimen Description BLOOD LEFT HAND  Final   Special Requests   Final    BOTTLES DRAWN AEROBIC AND ANAEROBIC Blood Culture adequate volume   Culture  Setup Time   Final    GRAM POSITIVE RODS ANAEROBIC BOTTLE ONLY CRITICAL RESULT CALLED TO, READ BACK BY AND VERIFIED WITH: AMEYER,PHARMD _0  09/20/17 BY LHOWARD GRAM POSITIVE COCCI IN CLUSTERS AEROBIC BOTTLE ONLY CRITICAL RESULT CALLED TO, READ BACK BY AND VERIFIED WITH: AMEYER.PHARMD _1  09/20/17 BY LHOWARD    Culture (A)  Final    CLOSTRIDIUM PERFRINGENS METHICILLIN RESISTANT STAPHYLOCOCCUS AUREUS    Report Status 09/22/2017 FINAL  Final   Organism ID, Bacteria METHICILLIN RESISTANT STAPHYLOCOCCUS AUREUS  Final      Susceptibility   Methicillin resistant staphylococcus aureus - MIC*    CIPROFLOXACIN >=8 RESISTANT Resistant     ERYTHROMYCIN >=8 RESISTANT Resistant     GENTAMICIN <=0.5 SENSITIVE Sensitive     OXACILLIN >=4 RESISTANT Resistant     TETRACYCLINE <=1 SENSITIVE Sensitive     VANCOMYCIN 1 SENSITIVE Sensitive     TRIMETH/SULFA <=10 SENSITIVE Sensitive     CLINDAMYCIN RESISTANT Resistant     RIFAMPIN <=0.5 SENSITIVE Sensitive     Inducible Clindamycin POSITIVE Resistant     * METHICILLIN RESISTANT STAPHYLOCOCCUS AUREUS  Blood Culture ID Panel (Reflexed)     Status: None   Collection Time: 09/20/17  3:40 AM  Result Value Ref Range Status   Enterococcus species NOT DETECTED NOT DETECTED Final   Listeria monocytogenes NOT DETECTED NOT DETECTED Final   Staphylococcus species NOT DETECTED NOT DETECTED Final   Staphylococcus aureus NOT DETECTED NOT DETECTED Final   Streptococcus species NOT DETECTED NOT DETECTED Final   Streptococcus agalactiae NOT DETECTED NOT DETECTED Final   Streptococcus pneumoniae NOT DETECTED NOT DETECTED Final   Streptococcus pyogenes NOT DETECTED NOT  DETECTED Final   Acinetobacter baumannii NOT DETECTED NOT DETECTED Final   Enterobacteriaceae species NOT DETECTED NOT DETECTED Final   Enterobacter cloacae complex NOT DETECTED NOT DETECTED Final   Escherichia coli NOT DETECTED NOT DETECTED Final   Klebsiella oxytoca NOT DETECTED NOT DETECTED Final   Klebsiella pneumoniae NOT DETECTED NOT DETECTED Final   Proteus species NOT DETECTED NOT DETECTED Final   Serratia marcescens NOT DETECTED NOT DETECTED Final   Haemophilus influenzae NOT DETECTED NOT DETECTED Final   Neisseria meningitidis NOT DETECTED NOT DETECTED Final   Pseudomonas aeruginosa NOT DETECTED NOT DETECTED Final   Candida albicans NOT DETECTED NOT DETECTED  Final   Candida glabrata NOT DETECTED NOT DETECTED Final   Candida krusei NOT DETECTED NOT DETECTED Final   Candida parapsilosis NOT DETECTED NOT DETECTED Final   Candida tropicalis NOT DETECTED NOT DETECTED Final  Blood Culture ID Panel (Reflexed)     Status: Abnormal   Collection Time: 09/20/17  3:40 AM  Result Value Ref Range Status   Enterococcus species NOT DETECTED NOT DETECTED Final   Listeria monocytogenes NOT DETECTED NOT DETECTED Final   Staphylococcus species DETECTED (A) NOT DETECTED Final    Comment: CRITICAL RESULT CALLED TO, READ BACK BY AND VERIFIED WITH: AMEYER,PHARMD _0  09/20/17 BY LHOWARD    Staphylococcus aureus DETECTED (A) NOT DETECTED Final    Comment: Methicillin (oxacillin)-resistant Staphylococcus aureus (MRSA). MRSA is predictably resistant to beta-lactam antibiotics (except ceftaroline). Preferred therapy is vancomycin unless clinically contraindicated. Patient requires contact precautions if  hospitalized. CRITICAL RESULT CALLED TO, READ BACK BY AND VERIFIED WITH: AMEYER,PHARMD _1  09/20/17 BY LHOWARD    Methicillin resistance DETECTED (A) NOT DETECTED Final    Comment: CRITICAL RESULT CALLED TO, READ BACK BY AND VERIFIED WITH: AMEYER,PHARMD _2  09/20/17    Streptococcus species  NOT DETECTED NOT DETECTED Final   Streptococcus agalactiae NOT DETECTED NOT DETECTED Final   Streptococcus pneumoniae NOT DETECTED NOT DETECTED Final   Streptococcus pyogenes NOT DETECTED NOT DETECTED Final   Acinetobacter baumannii NOT DETECTED NOT DETECTED Final   Enterobacteriaceae species NOT DETECTED NOT DETECTED Final   Enterobacter cloacae complex NOT DETECTED NOT DETECTED Final   Escherichia coli NOT DETECTED NOT DETECTED Final   Klebsiella oxytoca NOT DETECTED NOT DETECTED Final   Klebsiella pneumoniae NOT DETECTED NOT DETECTED Final   Proteus species NOT DETECTED NOT DETECTED Final   Serratia marcescens NOT DETECTED NOT DETECTED Final   Haemophilus influenzae NOT DETECTED NOT DETECTED Final   Neisseria meningitidis NOT DETECTED NOT DETECTED Final   Pseudomonas aeruginosa NOT DETECTED NOT DETECTED Final   Candida albicans NOT DETECTED NOT DETECTED Final   Candida glabrata NOT DETECTED NOT DETECTED Final   Candida krusei NOT DETECTED NOT DETECTED Final   Candida parapsilosis NOT DETECTED NOT DETECTED Final   Candida tropicalis NOT DETECTED NOT DETECTED Final  Culture, respiratory (NON-Expectorated)     Status: None   Collection Time: 09/20/17  8:20 AM  Result Value Ref Range Status   Specimen Description TRACHEAL ASPIRATE  Final   Special Requests NONE  Final   Gram Stain   Final    ABUNDANT WBC PRESENT, PREDOMINANTLY PMN RARE SQUAMOUS EPITHELIAL CELLS PRESENT MODERATE GRAM POSITIVE COCCI IN PAIRS    Culture   Final    MODERATE METHICILLIN RESISTANT STAPHYLOCOCCUS AUREUS   Report Status 09/22/2017 FINAL  Final   Organism ID, Bacteria METHICILLIN RESISTANT STAPHYLOCOCCUS AUREUS  Final      Susceptibility   Methicillin resistant staphylococcus aureus - MIC*    CIPROFLOXACIN >=8 RESISTANT Resistant     ERYTHROMYCIN >=8 RESISTANT Resistant     GENTAMICIN <=0.5 SENSITIVE Sensitive     OXACILLIN >=4 RESISTANT Resistant     TETRACYCLINE <=1 SENSITIVE Sensitive     VANCOMYCIN  <=0.5 SENSITIVE Sensitive     TRIMETH/SULFA <=10 SENSITIVE Sensitive     CLINDAMYCIN RESISTANT Resistant     RIFAMPIN <=0.5 SENSITIVE Sensitive     Inducible Clindamycin POSITIVE Resistant     * MODERATE METHICILLIN RESISTANT STAPHYLOCOCCUS AUREUS  MRSA PCR Screening     Status: Abnormal   Collection Time: 09/20/17  9:16 AM  Result Value Ref  Range Status   MRSA by PCR POSITIVE (A) NEGATIVE Final    Comment:        The GeneXpert MRSA Assay (FDA approved for NASAL specimens only), is one component of a comprehensive MRSA colonization surveillance program. It is not intended to diagnose MRSA infection nor to guide or monitor treatment for MRSA infections. RESULT CALLED TO, READ BACK BY AND VERIFIED WITH: Jacolyn Reedy RN 11:20 09/20/17 ([email or web address])   Urine Culture     Status: None   Collection Time: 09/21/17 12:57 PM  Result Value Ref Range Status   Specimen Description URINE, CLEAN CATCH  Final   Special Requests NONE  Final   Culture NO GROWTH  Final   Report Status 09/22/2017 FINAL  Final  Culture, blood (routine x 2)     Status: None (Preliminary result)   Collection Time: 09/22/17  8:20 AM  Result Value Ref Range Status   Specimen Description BLOOD RIGHT HAND  Final   Special Requests IN PEDIATRIC BOTTLE Blood Culture adequate volume  Final   Culture NO GROWTH 4 DAYS  Final   Report Status PENDING  Incomplete  Culture, blood (routine x 2)     Status: None (Preliminary result)   Collection Time: 09/22/17  8:30 AM  Result Value Ref Range Status   Specimen Description BLOOD LEFT HAND  Final   Special Requests IN PEDIATRIC BOTTLE Blood Culture adequate volume  Final   Culture NO GROWTH 4 DAYS  Final   Report Status PENDING  Incomplete    Studies/Results: Ct Head Wo Contrast  Result Date: 09/27/2017 CLINICAL DATA:  61 y/o M; altered mental status. Patient in respiratory distress and intubated. EXAM: CT HEAD WITHOUT CONTRAST TECHNIQUE: Contiguous axial images were obtained  from the base of the skull through the vertex without intravenous contrast. COMPARISON:  09/22/2017 CT head. FINDINGS: Brain: No evidence of acute infarction, hemorrhage, hydrocephalus, extra-axial collection or mass lesion/mass effect. Stable chronic microvascular ischemic changes, multiple chronic infarcts within bilateral cerebellar hemispheres, right occipital lobe, left anterior temporal lobe, pons, and bilateral basal ganglia. Stable brain parenchymal volume loss. Vascular: Calcific atherosclerosis of carotid siphons. No hyperdense vessel identified. Skull: Normal. Negative for fracture or focal lesion. Sinuses/Orbits: Stable right partial mastoid opacification. Small bilateral maxillary sinus mucous retention cyst. Otherwise negative. Other: None. IMPRESSION: 1. No acute intracranial abnormality. 2. Stable chronic microvascular ischemic changes, multiple chronic infarctions, and brain parenchymal volume loss. Electronically Signed   By: Kristine Garbe M.D.   On: 09/27/2017 00:44   Dg Chest Port 1 View  Result Date: 09/27/2017 CLINICAL DATA:  Follow-up pneumonia EXAM: PORTABLE CHEST 1 VIEW COMPARISON:  09/26/2017 FINDINGS: Endotracheal tube is again noted in satisfactory position. Right-sided PICC line is stable in appearance. Cardiac shadow remains enlarged. Diffuse bilateral infiltrates left greater than right are noted. Left-sided pleural effusion is noted as well. No acute bony abnormality is seen. IMPRESSION: No significant interval change from the prior exam. Electronically Signed   By: Inez Catalina M.D.   On: 09/27/2017 07:15   Dg Chest Port 1 View  Result Date: 09/27/2017 CLINICAL DATA:  Acute respiratory failure. EXAM: PORTABLE CHEST 1 VIEW COMPARISON:  09/26/2017 FINDINGS: Post intubation. Endotracheal tube terminates 2.3 cm above the carina. Right PICC line terminates at the cavoatrial junction. The cardiac silhouette is partially obscured by dense bilateral alveolar and  interstitial opacities with central predominance. More confluent airspace consolidation the left lower lobe cannot be excluded. There are probably bilateral pleural effusions. No pneumothorax. Osseous structures  are without acute abnormality. Soft tissues are grossly normal. IMPRESSION: Post intubation. Significant worsening of the aeration of the lungs with mixed pattern pulmonary edema and bilateral pleural effusions. More confluent airspace consolidation in the left lower lobe cannot be excluded. Electronically Signed   By: Fidela Salisbury M.D.   On: 09/27/2017 00:14   Dg Chest Port 1 View  Result Date: 09/26/2017 CLINICAL DATA:  Respiratory failure.  Status postextubation EXAM: PORTABLE CHEST 1 VIEW COMPARISON:  September 25, 2017 FINDINGS: Endotracheal tube and nasogastric tube are no longer evident. No pneumothorax. There are pleural effusions bilaterally with extensive interstitial and alveolar opacities, likely diffuse pulmonary edema. There is cardiomegaly with pulmonary venous hypertension. No adenopathy is appreciable by radiography. There is an old healed fracture of the right clavicle. IMPRESSION: Endotracheal tube and nasogastric tube have been removed. No pneumothorax. Diffuse pulmonary edema with small pleural effusions noted. Underlying pulmonary venous hypertension and cardiomegaly. Suspect congestive heart failure. There may well be a degree of superimposed ARDS. Both entities may exist concurrently. Electronically Signed   By: Lowella Grip III M.D.   On: 09/26/2017 07:52     Assessment/Plan: MRSA pneumonia VDRF  C perfringens bacteremia (CT A/P without identified source)  Apical mural thrombus CKD3  CHF (EF 30-35%)  Total days of antibiotics: 7 vanco, 5 flagyl  Await his TEE (hopefully today) Repeat BCx 11-30 No fever noted, no change in WBC. Not clear that "sepsis" caused his failure over night.  PIC placed yesterday         Bobby Rumpf MD, FACP Infectious  Diseases (pager) [phone] [email or web address] 09/27/2017, 8:23 AM  LOS: 7 days

## 2017-09-28 ENCOUNTER — Inpatient Hospital Stay (HOSPITAL_COMMUNITY): Payer: Medicare Other

## 2017-09-28 DIAGNOSIS — B9689 Other specified bacterial agents as the cause of diseases classified elsewhere: Secondary | ICD-10-CM

## 2017-09-28 DIAGNOSIS — R7881 Bacteremia: Secondary | ICD-10-CM

## 2017-09-28 DIAGNOSIS — I5022 Chronic systolic (congestive) heart failure: Secondary | ICD-10-CM

## 2017-09-28 LAB — BASIC METABOLIC PANEL
ANION GAP: 8 (ref 5–15)
BUN: 86 mg/dL — ABNORMAL HIGH (ref 6–20)
CALCIUM: 8.9 mg/dL (ref 8.9–10.3)
CO2: 23 mmol/L (ref 22–32)
Chloride: 111 mmol/L (ref 101–111)
Creatinine, Ser: 2.02 mg/dL — ABNORMAL HIGH (ref 0.61–1.24)
GFR, EST AFRICAN AMERICAN: 39 mL/min — AB (ref >60)
GFR, EST NON AFRICAN AMERICAN: 34 mL/min — AB (ref >60)
Glucose, Bld: 147 mg/dL — ABNORMAL HIGH (ref 65–99)
POTASSIUM: 4.1 mmol/L (ref 3.5–5.1)
Sodium: 142 mmol/L (ref 135–145)

## 2017-09-28 LAB — GLUCOSE, CAPILLARY
GLUCOSE-CAPILLARY: 150 mg/dL — AB (ref 65–99)
GLUCOSE-CAPILLARY: 133 mg/dL — AB (ref 65–99)
GLUCOSE-CAPILLARY: 163 mg/dL — AB (ref 65–99)
GLUCOSE-CAPILLARY: 115 mg/dL — AB (ref 65–99)
GLUCOSE-CAPILLARY: 108 mg/dL — AB (ref 65–99)
Glucose-Capillary: 118 mg/dL — ABNORMAL HIGH (ref 65–99)

## 2017-09-28 LAB — CBC
HCT: 31.5 % — ABNORMAL LOW (ref 39.0–52.0)
HEMOGLOBIN: 10 g/dL — AB (ref 13.0–17.0)
MCH: 31 pg (ref 26.0–34.0)
MCHC: 31.7 g/dL (ref 30.0–36.0)
MCV: 97.5 fL (ref 78.0–100.0)
PLATELETS: 259 10*3/uL (ref 150–400)
RBC: 3.23 MIL/uL — AB (ref 4.22–5.81)
RDW: 14.8 % (ref 11.5–15.5)
WBC: 12.6 10*3/uL — AB (ref 4.0–10.5)

## 2017-09-28 LAB — HEPARIN LEVEL (UNFRACTIONATED)
HEPARIN UNFRACTIONATED: 0.89 [IU]/mL — AB (ref 0.30–0.70)
Heparin Unfractionated: 0.75 IU/mL — ABNORMAL HIGH (ref 0.30–0.70)
Heparin Unfractionated: 0.63 IU/mL (ref 0.30–0.70)

## 2017-09-28 MED ORDER — HEPARIN (PORCINE) IN NACL 100-0.45 UNIT/ML-% IJ SOLN
2350.0000 [IU]/h | INTRAMUSCULAR | Status: DC
  Administered 2017-09-28: 2350 [IU]/h via INTRAVENOUS
  Filled 2017-09-28 (×4): qty 250

## 2017-09-28 MED ORDER — DEXMEDETOMIDINE HCL IN NACL 400 MCG/100ML IV SOLN
0.4000 ug/kg/h | INTRAVENOUS | Status: DC
  Administered 2017-09-28: 0.4 ug/kg/h via INTRAVENOUS
  Filled 2017-09-28: qty 100

## 2017-09-28 MED ORDER — CHLORHEXIDINE GLUCONATE 0.12 % MT SOLN: 15.0000 mL | Freq: Two times a day (BID) | OROMUCOSAL | Status: DC

## 2017-09-28 MED ORDER — ORAL CARE MOUTH RINSE: 15.0000 mL | Freq: Two times a day (BID) | OROMUCOSAL | Status: DC

## 2017-09-28 MED ORDER — DIAZEPAM 2 MG PO TABS
2.0000 mg | ORAL_TABLET | Freq: Every day | ORAL | Status: DC
  Administered 2017-09-28 – 2017-10-09 (×11): 2 mg via ORAL
  Filled 2017-09-28 (×11): qty 1

## 2017-09-28 MED ORDER — VITAL AF 1.2 CAL PO LIQD
1000.0000 mL | ORAL | Status: DC
  Administered 2017-09-29 – 2017-10-10 (×12): 1000 mL
  Filled 2017-09-28 (×12): qty 1000

## 2017-09-28 MED ORDER — SODIUM CHLORIDE 0.9 % IV SOLN: 0.4000 ug/kg/h | INTRAVENOUS | Status: DC

## 2017-09-28 NOTE — Procedures (Signed)
Extubation Procedure Note  Patient Details:   Name: Curtis Keller DOB: September 05, 1956 MRN: [id number]   Airway Documentation:     Evaluation  O2 sats: stable throughout Complications: No apparent complications Patient did tolerate procedure well. Bilateral Breath Sounds: Diminished, Clear   Yes   Pt extubated per MD order.  Pt placed on 4L [place] with sats of 98%.  RN at bedside.  RT will continue to monitor.  Ronny Flurry 09/28/2017, 3:31 PM

## 2017-09-28 NOTE — Progress Notes (Signed)
eLink Physician-Brief Progress Note Patient Name: VIRAT DISTAD DOB: [birth year] MRN: [id number]   Date of Service  09/28/2017  HPI/Events of Note  Patient waking up more. Endotracheally intubated.  eICU Interventions  1. Precedex drip ordered to avoid sedatives 2. Bilateral soft wrist restraints ordered to prevent self-extubation      Intervention Category Major Interventions: Change in mental status - evaluation and management  Lawanda Cousins 09/28/2017, 4:11 AM

## 2017-09-28 NOTE — Progress Notes (Signed)
ANTICOAGULATION CONSULT NOTE - Follow Up Consult  Pharmacy Consult:  Heparin Indication:  History of atrial thrombus  Allergies  Allergen Reactions  . Codeine Anaphylaxis and Nausea And Vomiting  . Lisinopril Itching and Rash  . Hydrocodone Other (See Comments)    On MAR  . Hydrocodone-Acetaminophen Nausea And Vomiting    Cannot take any acetaminophen due to liver issues per sister  . Tegaderm Ag Mesh [Silver] Other (See Comments)    Allergy to "silver compounds" listed on MAR  . Tylenol [Acetaminophen] Nausea Only and Other (See Comments)    Cannot take any acetaminophen due to liver issues per sister    Patient Measurements: Height: 6' (182.9 cm) Weight: 217 lb 9.5 oz (98.7 kg) IBW/kg (Calculated) : 77.6 Heparin Dosing Weight:  97 kg  Vital Signs: Temp: 98.6 F (37 C) (12/06 1112) Temp Source: Oral (12/06 1112) BP: 116/81 (12/06 1217) Pulse Rate: 92 (12/06 1217)  Labs: Recent Labs    09/26/17 0421 09/26/17 2325 09/27/17 0213  09/27/17 0707 09/27/17 1336 09/28/17 0317 09/28/17 1216  HGB 10.1*  --  9.4*  --   --   --  10.0*  --   HCT 31.9*  --  29.0*  --   --   --  31.5*  --   PLT 215  --  219  --   --   --  259  --   HEPARINUNFRC 0.44  --   --    < > 0.44  --  0.89* 0.75*  CREATININE 1.93*  --  1.99*  --   --   --  2.02*  --   TROPONINI  --  0.48* 0.46*  --   --  0.29*  --   --    < > = values in this interval not displayed.    Estimated Creatinine Clearance: 46.1 mL/min (A) (by C-G formula based on SCr of 2.02 mg/dL (H)).     Assessment: 9 YOM with history of atrial thrombus to continue on IV heparin while Coumadin is on hold.  Heparin is slightly supra-therapeutic post rate reduction this AM.  No bleeding reported.  RN reported that heparin is infusing through the central line and lab was also drawn through the central line.  Proper procedure was conducted for lab draw.    Goal of Therapy:  Heparin level 0.3-0.7 units/ml Monitor platelets by  anticoagulation protocol: Yes    Plan:  Reduce heparin gtt to 2350 units/hr Check 6 hr heparin level Daily heparin level, CBC   Cieara Stierwalt D. Laney Potash, PharmD, BCPS Pager:  [phone] 09/28/2017, 1:10 PM

## 2017-09-28 NOTE — Consult Note (Addendum)
Advanced Heart Failure Team Consult Note  Primary Physician: Primary Cardiologist:  New to [facility]. (Dr. Aundra Dubin)  Reason for Consultation: systolic CHF, frequent ectopy  HPI:    Curtis Keller is seen today for evaluation of systolic CHF at the request of Dr. Nelda Marseille.   Curtis Keller is a 61 y.o. male with history of COPD, Systolic CHF EF 76%, CAD, hx of CVA with left sided weakness, h/o of prolonged vent wean s/p trach, peg s/p decannulation, as well as known apical mural thrombus.   Pt presented from SNF with AMS 09/20/17 thought to be from UTI. Noted to have severe SOB and satting in 70s on CPAP in ED. Intubated and transferred to ICU.  He was initially on pressors for presumed septic shock, now off.   Pertinent labs on admission include creatinine 2.66, K 5.3, BNP 1771.9, Troponin 2.69 and trended down, PCT 4.14, WBC 19.0, Lactic Acid 6.66.  BCx + for MRSA bacteremia and C Perfringens. Chest CT with bilateral infiltrates. ID has been following and treated with Vanc and Flagyl.  Plan to continue for 7 more days (14 days total).   Pt remains intubated. History obtained from chart. He is awake and responds to command.   TTE 09/22/17 LVEF 30-35%, Mod LAE.   TEE 09/27/17 with LVEF 20-25%, small calcified LV thombus within the apex.   Repeat BCx 09/22/17 with NG (final)  PICC placed 09/26/17 for on-going ABX.    Review of Systems: [y] = yes, [ ]  = no   General: Weight gain [ ] ; Weight loss [ ] ; Anorexia [ ] ; Fatigue [ ] ; Fever [ ] ; Chills [ ] ; Weakness [ ]   Cardiac: Chest pain/pressure [ ] ; Resting SOB [ ] ; Exertional SOB [ ] ; Orthopnea [ ] ; Pedal Edema [ ] ; Palpitations [ ] ; Syncope [ ] ; Presyncope [ ] ; Paroxysmal nocturnal dyspnea[ ]   Pulmonary: Cough [ ] ; Wheezing[ ] ; Hemoptysis[ ] ; Sputum [ ] ; Snoring [ ]   GI: Vomiting[ ] ; Dysphagia[ ] ; Melena[ ] ; Hematochezia [ ] ; Heartburn[ ] ; Abdominal pain [ ] ; Constipation [ ] ; Diarrhea [ ] ; BRBPR [ ]   GU: Hematuria[ ] ; Dysuria [ ] ;  Nocturia[ ]   Vascular: Pain in legs with walking [ ] ; Pain in feet with lying flat [ ] ; Non-healing sores [ ] ; Stroke [ ] ; TIA [ ] ; Slurred speech [ ] ;  Neuro: Headaches[ ] ; Vertigo[ ] ; Seizures[ ] ; Paresthesias[ ] ;Blurred vision [ ] ; Diplopia [ ] ; Vision changes [ ]   Ortho/Skin: Arthritis [ ] ; Joint pain [ ] ; Muscle pain [ ] ; Joint swelling [ ] ; Back Pain [ ] ; Rash [ ]   Psych: Depression[ ] ; Anxiety[ ]   Heme: Bleeding problems [ ] ; Clotting disorders [ ] ; Anemia [ ]   Endocrine: Diabetes [ ] ; Thyroid dysfunction[ ]   Home Medications Prior to Admission medications   Medication Sig Start Date End Date Taking? Authorizing Provider  acetylcysteine (MUCOMYST) 20 % nebulizer solution Take 4 mLs by nebulization 2 (two) times daily. 04/13/17  Yes Rai, Ripudeep K, MD  albuterol (PROVENTIL) (2.5 MG/3ML) 0.083% nebulizer solution Take 3 mLs (2.5 mg total) by nebulization every 4 (four) hours as needed for wheezing or shortness of breath. 04/13/17  Yes Rai, Ripudeep K, MD  Amino Acids-Protein Hydrolys (FEEDING SUPPLEMENT, PRO-STAT SUGAR FREE 64,) LIQD Place 60 mLs into feeding tube 2 (two) times daily.    Yes [provider]  atorvastatin (LIPITOR) 40 MG tablet Place 40 mg into feeding tube at bedtime. for Lipids   Yes [provider]  bisacodyl (DULCOLAX) 10 MG suppository Place 1 suppository (10 mg total) rectally daily as needed for moderate constipation. 04/13/17  Yes Rai, Ripudeep K, MD  busPIRone (BUSPAR) 5 MG tablet Take 15 mg by mouth 3 (three) times daily.    Yes [provider]  diazepam (VALIUM) 5 MG tablet Take 2.5 mg by mouth every morning.   Yes [provider]  famotidine (PEPCID) 20 MG tablet Place 20 mg into feeding tube 2 (two) times daily. for GERD   Yes [provider]  folic acid (FOLVITE) 1 MG tablet Place 1 mg into feeding tube daily.    Yes [provider]  guaiFENesin 200 MG tablet Place 200 mg into feeding tube 3 (three) times  daily. for CHF   Yes [provider]  haloperidol decanoate (HALDOL DECANOATE) 100 MG/ML injection Inject 100 mg into the muscle once.   Yes [provider]  HYDROCORTISONE, TOPICAL, 1 % GEL Apply 1 application topically every 6 (six) hours as needed (itching).   Yes [provider]  ipratropium-albuterol (DUONEB) 0.5-2.5 (3) MG/3ML SOLN Take 3 mLs by nebulization 4 (four) times daily. 04/13/17  Yes Rai, Ripudeep K, MD  lidocaine (LIDODERM) 5 % Place 2 patches onto the skin See admin instructions. Apply 2 patches to left knee every morning -remove & discard patch within 12 hours or as directed by MD 04/13/17  Yes Rai, Ripudeep K, MD  metoprolol tartrate (LOPRESSOR) 25 MG tablet Place 25 mg into feeding tube 2 (two) times daily.   Yes [provider]  Multiple Vitamin (MULTIVITAMIN WITH MINERALS) TABS tablet Place 1 tablet into feeding tube daily.   Yes [provider]  Omega-3 Fatty Acids (FISH OIL) 1000 MG CAPS Place 1,000 mg into feeding tube daily.   Yes [provider]  polyethylene glycol (MIRALAX / GLYCOLAX) packet Place 17 g into feeding tube daily. for constipation   Yes [provider]  senna (SENOKOT) 8.6 MG TABS tablet Place 1 tablet (8.6 mg total) into feeding tube daily as needed for mild constipation. For constipation 04/13/17  Yes Rai, Ripudeep K, MD  tamsulosin (FLOMAX) 0.4 MG CAPS capsule 0.4 mg daily at 6 PM. Give 1 capsule (0.4 mg) via PEG-Tube one time daily   Yes [provider]  tizanidine (ZANAFLEX) 6 MG capsule Take 18 mg by mouth 3 (three) times daily.   Yes [provider]  vitamin B-12 1000 MCG tablet Place 1 tablet (1,000 mcg total) into feeding tube daily. 04/14/17  Yes Rai, Ripudeep K, MD  warfarin (COUMADIN) 5 MG tablet Take 5 mg by mouth daily.   Yes [provider]  Water For Irrigation, Sterile (FREE WATER) SOLN Place 150 mLs into feeding tube every 4 (four) hours. 03/30/17  Yes Reyne Dumas, MD    Past Medical History: Past Medical History:  Diagnosis Date  . Acute respiratory failure ([place])    a. Spring 2017 following fall/splenectomy -->admitted to [facility]-->trach/g tube.  . Anemia    a. 12/2015 ABL in setting of fall/hematomas/splenic laceration req splenectomy.  Marland Kitchen Anxiety   . Apical mural thrombus    a. 11/2015 Echo: EF 25-30%, moderate size apical thrombus-->coumadin;  b. 12/2015 f/u Echo: EF 25-30%, ant/septa HK, no obvious large thrombus but cannot exclude small mural thrombus.  Marland Kitchen CAD (coronary artery disease)    a. 12/2014 s/p BMS to the RCA.  Marland Kitchen Cerebral infarction ([place])   . Chronic bronchitis ([place])    "probably"/sister 04/07/2017  .  Chronic systolic CHF (congestive heart failure) ([place])    a. 12/2015 Echo: EF 25-30%.  . Dementia   . Depression   . Dysphagia   . Falls    a. 11/2015 traumatic fall with resultant trauma req splenectomy and prolonged hospitalization complicated by resp failure.  . Hepatitis C    "never treated" (04/07/2017)  . History of blood transfusion 12/2015; 04/07/2017   "related to spleen OR; related to low HgB"   . History of gout   . Hyperlipidemia   . Hypertension   . Ischemic cardiomyopathy    a. 12/2015 Echo: EF 25-30%, anterior and septal HK.  Marland Kitchen Left hemiplegia ([place])   . Myocardial infarction ([place]) 1999; ?2010  . On home oxygen therapy    "28% at the SNF" (6/15/2018Z)  . Pneumonia    "several times since 12/19/2015" (6/15/20180)  . Protein calorie malnutrition ([place])   . Respiratory failure ([place])   . Spleen injury   . Status post tracheostomy ([place])    a. 01/2016 in setting of ongoing resp failure and aspiration.  . Stroke ([place]) 12/19/2015   "left side paralyzed since" (04/07/2017)  . Type II diabetes mellitus ([place])     Past Surgical History: Past Surgical History:  Procedure Laterality Date  . ABDOMINAL HERNIA REPAIR    . CARDIAC CATHETERIZATION    . CORONARY ANGIOPLASTY WITH STENT PLACEMENT  1999    "[place]"  . HERNIA REPAIR    . HIP ARTHROPLASTY Left 02/10/2017   Procedure: LEFT HIP HEMIARTHROPLASTY;  Surgeon: Newt Minion, MD;  Location: [facility];  Service: Orthopedics;  Laterality: Left;  . IR GASTROSTOMY TUBE MOD SED  02/02/2017  . IR GENERIC HISTORICAL  07/22/2016   IR GASTROSTOMY TUBE REMOVAL 07/22/2016 Sandi Mariscal, MD [facility]-INTERV RAD  . PR LAP, SPLENECTOMY  01/07/2016  . TRACHEOSTOMY TUBE PLACEMENT N/A 02/22/2016   Procedure: TRACHEOSTOMY;  Surgeon: Rozetta Nunnery, MD;  Location: [facility];  Service: ENT;  Laterality: N/A;  . TRACHEOSTOMY TUBE PLACEMENT N/A 01/14/2017   Procedure: TRACHEOSTOMY;  Surgeon: Rozetta Nunnery, MD;  Location: [facility];  Service: ENT;  Laterality: N/A;  inserted #[id number]    Family History: Family History  Problem Relation Age of Onset  . Alcohol abuse Mother   . Hypertension Mother   . Coronary artery disease Mother   . Alcohol abuse Father   . Hypertension Father   . Coronary artery disease Father   . Alcohol abuse Maternal Uncle     Social History: Social History   Socioeconomic History  . Marital status: Widowed    Spouse name: None  . Number of children: None  . Years of education: None  . Highest education level: None  Social Needs  . Financial resource strain: None  . Food insecurity - worry: None  . Food insecurity - inability: None  . Transportation needs - medical: None  . Transportation needs - non-medical: None  Occupational History  . None  Tobacco Use  . Smoking status: Former Smoker    Packs/day: 2.00    Years: 38.00    Pack years: 76.00    Types: Cigarettes    Last attempt to quit: 11/25/2015    Years since quitting: 1.8  . Smokeless tobacco: Never Used  . Tobacco comment: quit spring of 2017.  Substance and Sexual Activity  . Alcohol use: No    Alcohol/week: 0.0 oz  . Drug use: No  . Sexual activity: None  Other Topics Concern  . None  Social History Narrative   Lives @ SNF since Spring 2017    Sedentary   Unsteady on his feet   Falls about 1x/wk   Widowed   Sister - Doran Clay, RN    Allergies:  Allergies  Allergen Reactions  . Codeine Anaphylaxis and Nausea And Vomiting  . Lisinopril Itching and Rash  . Hydrocodone Other (See Comments)    On MAR  . Hydrocodone-Acetaminophen Nausea And Vomiting    Cannot take any acetaminophen due to liver issues per sister  . Tegaderm Ag Mesh [Silver] Other (See Comments)    Allergy to "silver compounds" listed on MAR  . Tylenol [Acetaminophen] Nausea Only and Other (See Comments)    Cannot take any acetaminophen due to liver issues per sister    Objective:    Vital Signs:   Temp:  [98.3 F (36.8 C)-99 F (37.2 C)] 98.6 F (37 C) (12/06 1112) Pulse Rate:  [39-85] 57 (12/06 1000) Resp:  [12-22] 15 (12/06 1000) BP: (78-121)/(55-90) 104/68 (12/06 1000) SpO2:  [92 %-100 %] 95 % (12/06 1000) Arterial Line BP: (97-114)/(62-72) 105/64 (12/06 0000) FiO2 (%):  [40 %] 40 % (12/06 0800) Weight:  [217 lb 9.5 oz (98.7 kg)] 217 lb 9.5 oz (98.7 kg) (12/06 0500) Last BM Date: 09/27/17  Weight change: Filed Weights   09/26/17 0454 09/27/17 0430 09/28/17 0500  Weight: 212 lb 11.9 oz (96.5 kg) 213 lb 6.5 oz (96.8 kg) 217 lb 9.5 oz (98.7 kg)    Intake/Output:   Intake/Output Summary (Last 24 hours) at 09/28/2017 1116 Last data filed at 09/28/2017 1000 Gross per 24 hour  Intake 4560.04 ml  Output 1205 ml  Net 3355.04 ml      Physical Exam    General: Intubated/sedated.  HEENT: + ETT Neck: Supple. JVP ~8. Carotids 2+ bilat; no bruits. No thyromegaly or nodule noted. Cor: PMI nondisplaced. RRR, No M/G/R noted Lungs: Diminished basilar sounds Abdomen: Soft, non-tender, non-distended, no HSM. No bruits or masses. +BS  Extremities: No cyanosis, clubbing, or rash. Trace ankle edema.  Neuro: Intubated/sedated  Telemetry   NSR 90s with PVCs, Personally reviewed  EKG    Sinus Brady 50s on 09/26/17.  Labs   Basic  Metabolic Panel: Recent Labs  Lab 09/21/17 2127 09/22/17 0459  09/24/17 0408 09/25/17 0327 09/26/17 0421 09/27/17 0213 09/28/17 0317  NA  --  143   < > 142 144 142 142 142  K  --  4.0   < > 3.8 4.3 3.8 4.0 4.1  CL  --  114*   < > 111 112* 106 108 111  CO2  --  23   < > 23 25 25 26 23   GLUCOSE  --  133*   < > 103* 119* 114* 107* 147*  BUN  --  65*   < > 72* 77* 80* 79* 86*  CREATININE  --  2.59*   < > 2.09* 2.08* 1.93* 1.99* 2.02*  CALCIUM  --  8.6*   < > 8.6* 9.0 8.8* 8.6* 8.9  MG 2.0 2.0  --  1.9 2.4 2.2 2.2  --   PHOS 3.4 3.3  --  3.7 3.5  --  4.6  --    < > = values in this interval not displayed.    Liver Function Tests: Recent Labs  Lab 09/22/17 0459 09/27/17 0213  AST 30  --   ALT 16*  --   ALKPHOS 66  --   BILITOT 0.5  --  PROT 6.2*  --   ALBUMIN 2.1* 2.0*   No results for input(s): LIPASE, AMYLASE in the last 168 hours. No results for input(s): AMMONIA in the last 168 hours.  CBC: Recent Labs  Lab 09/24/17 0408 09/25/17 0327 09/26/17 0421 09/27/17 0213 09/28/17 0317  WBC 8.9 10.7* 12.2* 8.7 12.6*  NEUTROABS  --   --   --  4.4  --   HGB 9.7* 10.2* 10.1* 9.4* 10.0*  HCT 30.6* 32.1* 31.9* 29.0* 31.5*  MCV 99.4 98.2 97.9 98.3 97.5  PLT 169 198 215 219 259    Cardiac Enzymes: Recent Labs  Lab 09/22/17 0459 09/24/17 0408 09/26/17 2325 09/27/17 0213 09/27/17 1336  TROPONINI 6.19* 2.69* 0.48* 0.46* 0.29*    BNP: BNP (last 3 results) Recent Labs    03/17/17 1438 03/30/17 0629 09/20/17 0545  BNP 601.2* 199.5* 1,771.9*    ProBNP (last 3 results) No results for input(s): PROBNP in the last 8760 hours.   CBG: Recent Labs  Lab 09/27/17 1922 09/27/17 2342 09/28/17 0441 09/28/17 0815 09/28/17 1109  GLUCAP 154* 154* 150* 133* 163*    Coagulation Studies: No results for input(s): LABPROT, INR in the last 72 hours.   Imaging   Dg Chest Port 1 View  Result Date: 09/28/2017 CLINICAL DATA:  Acute respiratory failure with hypoxia.  Follow-up. Ventilator support. EXAM: PORTABLE CHEST 1 VIEW COMPARISON:  09/27/2017 FINDINGS: Endotracheal tube tip is 3 cm above the carina. Right arm PICC tip in the SVC above the right atrium. Persistent bilateral infiltrates most severe in the left lower lobe, similar to yesterday's study but improved compared to the study of 2 days ago. No worsening or new findings. IMPRESSION: Stable since yesterday. Improved since 2 days ago. Bilateral infiltrates left worse than right. Electronically Signed   By: Nelson Chimes M.D.   On: 09/28/2017 07:39      Medications:     Current Medications: . atorvastatin  40 mg Per Tube QHS  . chlorhexidine gluconate (MEDLINE KIT)  15 mL Mouth Rinse BID  . Chlorhexidine Gluconate Cloth  6 each Topical Daily  . diazepam  2 mg Oral Daily  . feeding supplement (PRO-STAT SUGAR FREE 64)  30 mL Per Tube Daily  . free water  200 mL Per Tube Q8H  . insulin aspart  0-15 Units Subcutaneous Q4H  . mouth rinse  15 mL Mouth Rinse QID  . multivitamin  15 mL Per Tube Daily  . senna-docusate  1 tablet Oral Daily  . sodium chloride flush  10-40 mL Intracatheter Q12H     Infusions: . sodium chloride 250 mL (09/26/17 1800)  . sodium chloride 40 mL/hr at 09/28/17 0832  . dexmedetomidine Stopped (09/28/17 0755)  . DOPamine Stopped (09/27/17 1054)  . famotidine (PEPCID) IV Stopped (09/28/17 1009)  . feeding supplement (VITAL AF 1.2 CAL) 1,000 mL (09/28/17 0831)  . heparin 2,500 Units/hr (09/28/17 0800)  . metronidazole 500 mg (09/28/17 1009)  . norepinephrine (LEVOPHED) Adult infusion Stopped (09/27/17 2122)  . vancomycin Stopped (09/28/17 0610)       Patient Profile   Curtis Keller is a 61 y.o. male with history of COPD, Systolic CHF EF 37%, CAD, hx of CVA with left sided weakness, h/o of prolonged vent wean s/p trach, peg s/p decannulation, h/o Hepatitis C, as well as known apical mural thrombus admitted with worsening SOB and AMS  Found to have MRSA PNA and C.  Perfringens + MRSA bacteremia.   Assessment/Plan   1. Septic  Shock  - Very complicated picture with MRSA PNA and MRSA + C. Perfringens bacteremia - On Vanc and flagyl, to finish total of 14 days.  (to 10/05/17) - PICC in place  - Previously on pressors, but now weaned.   2. Acute on chronic respiratory failure - Remains intubated but weaning. Per CCM  3. Chronic systolic CHF due to ICM - TTE this admission EF 30-35% - TEE 09/27/17 with LVEF 20-25%, small calcified LV thombus within the apex.  - No room for neurohormonal blockade at this time.  - Pt not candidate for advanced therapies with marked co-morbidities. - Pt not candidate for ICD at this time with bacteremia, or long term with prognosis.  - Could consider cath if creatinine continues to improved.   4. CAD - s/p BMS to RCA 12/2014 per chart. - Troponin up to 2.6 this admission in setting of bacteremia - Continue atorvastatin 40 mg daily.  - Not on ASA with stable CAD on coumadin. On heparin currently.  5. LV Thrombus - On coumadin chronically - TEE yesterday, but results not in chart yet.   6. H/o CVA  - With residual left-sided hemiplegia - Chronic PEG in place. Tolerates Soft diet per notes.   7. AKI on CKD III-IV - Creatinine 2.66 on admit, down to 2.2 today.  - Had been 1.8 as recently as July, so unclear what new baseline will be.     Medication concerns reviewed with patient and pharmacy team. Barriers identified: None at this time.  Length of Stay: [place], [place]  09/28/2017, 11:16 AM  Advanced Heart Failure Team Pager [phone] (M-F; 7a - 4p)  Please contact [facility] for night-coverage after hours (4p -7a ) and weekends on [email or web address]  Patient seen with PA, agree with the above note.  1. Septic shock: Hypotensive initially, MRSA PNA with MRSA and C perfringens in blood.  He is now off pressors. BP remains soft, no HF meds yet.  2. Chronic systolic CHF: History of ischemic  cardiomyopathy.  TEE this admission with EF 20-25%.  CVP 4-5, not volume overloaded on exam.  - No indication for diuresis at this point.   3. PVCs/short (3 beats) NSVT: For now, would replace electrolytes.  When BP stabilizes, low dose Coreg would be reasonable.  I do not think that he will be an ICD candidate given baseline debilitation (left hemiparesis, PEG, living in SNF).  4. ID: No endocarditis on TEE.  Has MRSA PNA with MRSA and C perfringens in blood. 5. CAD: Has history of BMS to RCA in 2016.  Troponin to 2.6 this admission, could be demand ischemia from septic shock.   - Continue statin.  - No ASA with full anticoagulation.  - Consider eventual cath but would need to see improved renal function.  6. AKI on CKD: Creatinine trending down.   7. LV thrombus: Chronic warfarin, TEE appeared to show a small calcified LV thrombus.   - Currently on heparin gtt.  8. Acute hypoxemic respiratory failure: MRSA PNA.   Loralie Champagne 09/28/2017 12:32 PM

## 2017-09-28 NOTE — Progress Notes (Signed)
CSW continues to follow for discharge needs at this time.    Claude Manges Cena Bruhn, MSW, LCSW-A Emergency Department Clinical Social Worker [phone]

## 2017-09-28 NOTE — Progress Notes (Signed)
PULMONARY / CRITICAL CARE MEDICINE   Name: Curtis Keller MRN: [id number] DOB: [birth year]    ADMISSION DATE:  09/20/2017 CONSULTATION DATE:  10/20/2017  REFERRING MD:  ED  CHIEF COMPLAINT:  Shortness of breath  HISTORY OF PRESENT ILLNESS:   61 yr old M, SNF resident,  with PMH of COPD, CHF EF 20%, CAD, apical mural thrombus admitted 11/28 with reports of altered mental status for 2 days which was thought to be due to UTI. He was altered as per his sister for few days and the morning of admit he started having severe shortness of breath saturating in the 50s with minimal improvemnet on CPAP.  He was intubated in the ED which was very difficult as per the ED physician. Please refer to their note. CXR concerning for bilateral infiltrates, treated with cefepime and vancomycin.    SUBJECTIVE:  RN reports pt on precedex, has been sensitive to sedation.  No acute events overnight.   VITAL SIGNS: BP 104/74 (BP Location: Left Arm)   Pulse 60   Temp 99 F (37.2 C) (Oral)   Resp 17   Ht 6' (1.829 m)   Wt 217 lb 9.5 oz (98.7 kg)   SpO2 93%   BMI 29.51 kg/m   HEMODYNAMICS:    VENTILATOR SETTINGS: Vent Mode: PRVC FiO2 (%):  [40 %] 40 % Set Rate:  [14 bmp-16 bmp] 16 bmp Vt Set:  [620 mL] 620 mL PEEP:  [5 cmH20] 5 cmH20 Pressure Support:  [5 cmH20-8 cmH20] 8 cmH20 Plateau Pressure:  [18 cmH20-19 cmH20] 19 cmH20  INTAKE / OUTPUT: I/O last 3 completed shifts: In: 6807.5 [I.V.:2613.1; NG/GT:2044.4; IV Piggyback:2150] Out: 1985 [Urine:1985]  PHYSICAL EXAMINATION: General: chronically ill appearing male in NAD on vent HEENT: MM pink/moist, ETT Neuro: sedate, arouses but drifts back to sleep, MAE  CV: s1s2 rrr, no m/r/g PULM: even/non-labored, lungs bilaterally clear  HF:SFSE, non-tender, bsx4 active  Extremities: warm/dry, no edema  Skin: no rashes or lesions, sacral dressing intact  LABS:  BMET Recent Labs  Lab 09/26/17 0421 09/27/17 0213 09/28/17 0317  NA 142 142 142  K  3.8 4.0 4.1  CL 106 108 111  CO2 25 26 23   BUN 80* 79* 86*  CREATININE 1.93* 1.99* 2.02*  GLUCOSE 114* 107* 147*    Electrolytes Recent Labs  Lab 09/24/17 0408 09/25/17 0327 09/26/17 0421 09/27/17 0213 09/28/17 0317  CALCIUM 8.6* 9.0 8.8* 8.6* 8.9  MG 1.9 2.4 2.2 2.2  --   PHOS 3.7 3.5  --  4.6  --     CBC Recent Labs  Lab 09/26/17 0421 09/27/17 0213 09/28/17 0317  WBC 12.2* 8.7 12.6*  HGB 10.1* 9.4* 10.0*  HCT 31.9* 29.0* 31.5*  PLT 215 219 259    Coag's Recent Labs  Lab 09/22/17 0459  INR 1.77    Sepsis Markers Recent Labs  Lab 09/21/17 1257 09/22/17 0459  LATICACIDVEN 1.1 0.9  PROCALCITON 4.14  --     ABG Recent Labs  Lab 09/22/17 0343 09/26/17 0028  PHART 7.372 7.359  PCO2ART 40.0 48.1*  PO2ART 114* 215*    Liver Enzymes Recent Labs  Lab 09/22/17 0459 09/27/17 0213  AST 30  --   ALT 16*  --   ALKPHOS 66  --   BILITOT 0.5  --   ALBUMIN 2.1* 2.0*    Cardiac Enzymes Recent Labs  Lab 09/26/17 2325 09/27/17 0213 09/27/17 1336  TROPONINI 0.48* 0.46* 0.29*    Glucose Recent Labs  Lab  09/27/17 0830 09/27/17 1206 09/27/17 1614 09/27/17 1922 09/27/17 2342 09/28/17 0441  GLUCAP 125* 134* 139* 154* 154* 150*    Imaging Dg Chest Port 1 View  Result Date: 09/28/2017 CLINICAL DATA:  Acute respiratory failure with hypoxia. Follow-up. Ventilator support. EXAM: PORTABLE CHEST 1 VIEW COMPARISON:  09/27/2017 FINDINGS: Endotracheal tube tip is 3 cm above the carina. Right arm PICC tip in the SVC above the right atrium. Persistent bilateral infiltrates most severe in the left lower lobe, similar to yesterday's study but improved compared to the study of 2 days ago. No worsening or new findings. IMPRESSION: Stable since yesterday. Improved since 2 days ago. Bilateral infiltrates left worse than right. Electronically Signed   By: Paulina Fusi M.D.   On: 09/28/2017 07:39   CULTURES Sputum culture 11/28 >> MRSA BCx2 11/28 >> MRSA, C  perfringens BCx2 11/30 >> negative  ANTIBIOTICS: Zosyn 11/28 >> 11/29 Vanc 11/28 >> Flagyl 11/30 >>   EVENTS 11/28  Admit 12/03  Extubated  12/04  Re-intubated (suspect medication related) 12/05  TEE   STUDIES TEE 12/6 >> diffuse hypokinesis, LVEF 20-25%, may be a small calcified LV thrombus at the apex, no vegetation  LINES RUE PICC 12/4 >> ETT 12/4 >>   ASSESSMENT / PLAN: 61 year old SNF resident with multiple medical issues admitted with MRSA and Clostridium bacteremia, & MRSA HCAP.   Acute hypoxemic respiratory failure in the setting of diffuse pulmonary infiltrates  MRSA pneumonia with ALI  Difficult Intubation  Left Pleural Effusion  P: PRVC 8cc/kg PSV wean as tolerated  SBT this am with hope for extubation  ABX per ID, plan for ~ 2 weeks therapy  Follow intermittent CXR   Septic Shock - volume responsive w/ Clostridium Perfringens and MRSA bacteremia  Systolic HFrEF (20%). Repeat echo shows unchaged EF Elevated troponin, suspect demand ischemia he is not a candidate for intervention; this has trended down  P: ICU monitoring  Reduce IVF to 32ml/hr  TEE as above Repeat cultures negative  Stop hydrocortisone  CHF consult   History of left ventricular thrombus, on chronic warfarin therapy Anemia  P: Heparin gtt per pharmacy  Convert back to coumadin when able (on flagyl at present) Trend CBC   Acute on chronic kidney disease  Mild hyperchloremia  P: Free water 200 ml Q8  Trend BMP / urinary output Replace electrolytes as indicated Avoid nephrotoxic agents, ensure adequate renal perfusion  Metabolic encephalopathy; with history of stroke and left-sided hemiplegia since June 2018.  CT head neg. P: PT efforts  Minimize sedation  Hold home zanaflex > if restarted would reduce to lowest minimum dosing  Valium  QD   Constipation Hx of dysphasia; has chronic PEG, tolerates soft diet at SNF Hx of Hep C P: TF per Nutrition  SLP once extubated   PEG care per protocol  Pepcid for SUP  Senokot, hold for diarrhea   DM P: CBG with SSI   Sacral Decubitus - moisture associated  P: Frequent turning  WOC following Space boots for heel protection  Foam dressing to sacrum, heels   Hx Mechanical Falls with Prior Hip Fracture P: Monitor  Fall precautions  PT efforts   FAMILY  - Updates:  Sister updated on am rounding 12/5.  No family available am 12/6.    - Global:  ICU disposition.    Canary Brim, NP-C [facility] Pgr: [phone] or if no answer [phone] 09/28/2017, 7:47 AM  Attending Note:  61 year old male with  respiratory failure due to intentional drug overdose that grew MSSA bacteremia.  Patient was extubated then reintubated after being given muscle relaxants.  TEE was negative for vegetation with EF of 20-25%.  On exam, diffuse crackles noted.  I reviewed CXR, ETT is in good position.  Will hold all sedation, hold lasix for today.  Replace electrolytes.  Attempt to extubate today, if fails or not arousable then will proceed with tracheostomy.    The patient is critically ill with multiple organ systems failure and requires high complexity decision making for assessment and support, frequent evaluation and titration of therapies, application of advanced monitoring technologies and extensive interpretation of multiple databases.   Critical Care Time devoted to patient care services described in this note is  35  Minutes. This time reflects time of care of this signee Dr Koren BoundWesam Yacoub. This critical care time does not reflect procedure time, or teaching time or supervisory time of PA/NP/Med student/Med Resident etc but could involve care discussion time.  Alyson ReedyWesam G. Yacoub, M.D. [facility]. Pager: [phone]. After hours pager: [phone].

## 2017-09-28 NOTE — Progress Notes (Signed)
OT Cancellation Note  Patient Details Name: Curtis Keller MRN: [id number] DOB: Jul 09, 1956   Cancelled Treatment:    Reason Eval/Treat Not Completed: Medical issues which prohibited therapy(Heparin Initiated) Per Therapy Protcol, will wait for heparin to have been on heparin for 24 hours. Will check back in for OT eval tomorrow, or as Pt is medically ready.   Evern Bio Idy Rawling 09/28/2017, 8:28 AM  Sherryl Manges OTR/L [phone]

## 2017-09-28 NOTE — Progress Notes (Signed)
ANTICOAGULATION CONSULT NOTE Pharmacy Consult for heparin Indication: history of atrial thrombus  Allergies  Allergen Reactions  . Codeine Anaphylaxis and Nausea And Vomiting  . Lisinopril Itching and Rash  . Hydrocodone Other (See Comments)    On MAR  . Hydrocodone-Acetaminophen Nausea And Vomiting    Cannot take any acetaminophen due to liver issues per sister  . Tegaderm Ag Mesh [Silver] Other (See Comments)    Allergy to "silver compounds" listed on MAR  . Tylenol [Acetaminophen] Nausea Only and Other (See Comments)    Cannot take any acetaminophen due to liver issues per sister    Patient Measurements: Height: 6' (182.9 cm) Weight: 213 lb 6.5 oz (96.8 kg) IBW/kg (Calculated) : 77.6 Heparin Dosing Weight: 97 kg  Vital Signs: Temp: 98.4 F (36.9 C) (12/05 2344) Temp Source: Oral (12/05 2344) BP: 118/80 (12/06 0400) Pulse Rate: 77 (12/06 0400)  Labs: Recent Labs    09/26/17 0421 09/26/17 2325 09/27/17 0213 09/27/17 0508 09/27/17 0707 09/27/17 1336 09/28/17 0317  HGB 10.1*  --  9.4*  --   --   --  10.0*  HCT 31.9*  --  29.0*  --   --   --  31.5*  PLT 215  --  219  --   --   --  259  HEPARINUNFRC 0.44  --   --  >2.20* 0.44  --  0.89*  CREATININE 1.93*  --  1.99*  --   --   --  2.02*  TROPONINI  --  0.48* 0.46*  --   --  0.29*  --     Assessment: 61 y.o. male with h/o mural thrombus, Coumadin on hold, for heparin  Goal of Therapy:  Heparin level 0.3-0.7 units/ml Monitor platelets by anticoagulation protocol: Yes    Plan:   Decrease Heparin 2500 units/hr Check heparin level in 8 hours.   Geannie Risen, PharmD, BCPS  09/28/2017 4:19 AM

## 2017-09-28 NOTE — Progress Notes (Signed)
INFECTIOUS DISEASE PROGRESS NOTE  ID: Curtis Keller is a 61 y.o. male with  Active Problems:   Acute respiratory failure (HCC)   Acute systolic (congestive) heart failure (HCC)   Acute respiratory failure with hypoxia (HCC)   Acute hypoxemic respiratory failure (HCC)   MRSA bacteremia   Acute encephalopathy   Clostridium perfringens bacteremia   Subjective: Remains intubated. Currently awake and following commands.  Repeat TEE with EF 20%   Abtx:  Anti-infectives (From admission, onward)   Start     Dose/Rate Route Frequency Ordered Stop   09/22/17 1000  metroNIDAZOLE (FLAGYL) IVPB 500 mg     500 mg 100 mL/hr over 60 Minutes Intravenous Every 8 hours 09/22/17 0949     09/21/17 0600  vancomycin (VANCOCIN) IVPB 750 mg/150 ml premix     750 mg 150 mL/hr over 60 Minutes Intravenous Every 24 hours 09/20/17 0730     09/20/17 1400  piperacillin-tazobactam (ZOSYN) IVPB 3.375 g  Status:  Discontinued     3.375 g 12.5 mL/hr over 240 Minutes Intravenous Every 8 hours 09/20/17 0730 09/21/17 1004   09/20/17 0800  vancomycin (VANCOCIN) IVPB 750 mg/150 ml premix     750 mg 150 mL/hr over 60 Minutes Intravenous  Once 09/20/17 0730 09/20/17 2012   09/20/17 0415  ceFEPIme (MAXIPIME) 2 g in dextrose 5 % 50 mL IVPB     2 g 100 mL/hr over 30 Minutes Intravenous  Once 09/20/17 0402 09/20/17 0512   09/20/17 0415  vancomycin (VANCOCIN) IVPB 1000 mg/200 mL premix     1,000 mg 200 mL/hr over 60 Minutes Intravenous  Once 09/20/17 0402 09/20/17 0548      Medications:  Scheduled: . atorvastatin  40 mg Per Tube QHS  . chlorhexidine gluconate (MEDLINE KIT)  15 mL Mouth Rinse BID  . Chlorhexidine Gluconate Cloth  6 each Topical Daily  . diazepam  2 mg Oral Daily  . feeding supplement (PRO-STAT SUGAR FREE 64)  30 mL Per Tube Daily  . free water  200 mL Per Tube Q8H  . insulin aspart  0-15 Units Subcutaneous Q4H  . mouth rinse  15 mL Mouth Rinse QID  . multivitamin  15 mL Per Tube Daily  .  senna-docusate  1 tablet Oral Daily  . sodium chloride flush  10-40 mL Intracatheter Q12H    Objective: Vital signs in last 24 hours: Temp:  [98.3 F (36.8 C)-99 F (37.2 C)] 99 F (37.2 C) (12/06 0442) Pulse Rate:  [48-85] 52 (12/06 0845) Resp:  [12-19] 17 (12/06 0845) BP: (78-121)/(55-90) 89/62 (12/06 0845) SpO2:  [93 %-100 %] 94 % (12/06 0845) Arterial Line BP: (97-114)/(62-72) 105/64 (12/06 0000) FiO2 (%):  [40 %] 40 % (12/06 0800) Weight:  [217 lb 9.5 oz (98.7 kg)] 217 lb 9.5 oz (98.7 kg) (12/06 0500)  General appearance: alert, no distress and intubated  Eyes: conjunctivae/corneas clear. PERRL, EOM's intact. Fundi benign. Resp: Clear, diminished. Normal effort and rate.  Cardio: regular rate and rhythm and S1, S2 normal GI: normal findings: bowel sounds normal, soft, non-tender and g-tube in place  Extremities: extremities normal, atraumatic, no cyanosis or edema Neurologic: Mental status: alertness: alert  Lab Results Recent Labs    09/27/17 0213 09/28/17 0317  WBC 8.7 12.6*  HGB 9.4* 10.0*  HCT 29.0* 31.5*  NA 142 142  K 4.0 4.1  CL 108 111  CO2 26 23  BUN 79* 86*  CREATININE 1.99* 2.02*   Liver Panel Recent Labs  09/27/17 0213  ALBUMIN 2.0*   Sedimentation Rate No results for input(s): ESRSEDRATE in the last 72 hours. C-Reactive Protein No results for input(s): CRP in the last 72 hours.  Microbiology: Recent Results (from the past 240 hour(s))  Culture, blood (routine x 2)     Status: Abnormal   Collection Time: 09/20/17  3:30 AM  Result Value Ref Range Status   Specimen Description BLOOD RIGHT ANTECUBITAL  Final   Special Requests IN PEDIATRIC BOTTLE Blood Culture adequate volume  Final   Culture  Setup Time   Final    GRAM POSITIVE COCCI IN CLUSTERS IN PEDIATRIC BOTTLE CRITICAL VALUE NOTED.  VALUE IS CONSISTENT WITH PREVIOUSLY REPORTED AND CALLED VALUE.    Culture (A)  Final    STAPHYLOCOCCUS SPECIES (COAGULASE NEGATIVE) THE SIGNIFICANCE  OF ISOLATING THIS ORGANISM FROM A SINGLE SET OF BLOOD CULTURES WHEN MULTIPLE SETS ARE DRAWN IS UNCERTAIN. PLEASE NOTIFY THE MICROBIOLOGY DEPARTMENT WITHIN ONE WEEK IF SPECIATION AND SENSITIVITIES ARE REQUIRED.    Report Status 09/22/2017 FINAL  Final  Culture, blood (routine x 2)     Status: Abnormal   Collection Time: 09/20/17  3:40 AM  Result Value Ref Range Status   Specimen Description BLOOD LEFT HAND  Final   Special Requests   Final    BOTTLES DRAWN AEROBIC AND ANAEROBIC Blood Culture adequate volume   Culture  Setup Time   Final    GRAM POSITIVE RODS ANAEROBIC BOTTLE ONLY CRITICAL RESULT CALLED TO, READ BACK BY AND VERIFIED WITH: AMEYER,PHARMD @1830  09/20/17 BY LHOWARD GRAM POSITIVE COCCI IN CLUSTERS AEROBIC BOTTLE ONLY CRITICAL RESULT CALLED TO, READ BACK BY AND VERIFIED WITH: AMEYER.PHARMD @2100  09/20/17 BY LHOWARD    Culture (A)  Final    CLOSTRIDIUM PERFRINGENS METHICILLIN RESISTANT STAPHYLOCOCCUS AUREUS    Report Status 09/22/2017 FINAL  Final   Organism ID, Bacteria METHICILLIN RESISTANT STAPHYLOCOCCUS AUREUS  Final      Susceptibility   Methicillin resistant staphylococcus aureus - MIC*    CIPROFLOXACIN >=8 RESISTANT Resistant     ERYTHROMYCIN >=8 RESISTANT Resistant     GENTAMICIN <=0.5 SENSITIVE Sensitive     OXACILLIN >=4 RESISTANT Resistant     TETRACYCLINE <=1 SENSITIVE Sensitive     VANCOMYCIN 1 SENSITIVE Sensitive     TRIMETH/SULFA <=10 SENSITIVE Sensitive     CLINDAMYCIN RESISTANT Resistant     RIFAMPIN <=0.5 SENSITIVE Sensitive     Inducible Clindamycin POSITIVE Resistant     * METHICILLIN RESISTANT STAPHYLOCOCCUS AUREUS  Blood Culture ID Panel (Reflexed)     Status: None   Collection Time: 09/20/17  3:40 AM  Result Value Ref Range Status   Enterococcus species NOT DETECTED NOT DETECTED Final   Listeria monocytogenes NOT DETECTED NOT DETECTED Final   Staphylococcus species NOT DETECTED NOT DETECTED Final   Staphylococcus aureus NOT DETECTED NOT DETECTED  Final   Streptococcus species NOT DETECTED NOT DETECTED Final   Streptococcus agalactiae NOT DETECTED NOT DETECTED Final   Streptococcus pneumoniae NOT DETECTED NOT DETECTED Final   Streptococcus pyogenes NOT DETECTED NOT DETECTED Final   Acinetobacter baumannii NOT DETECTED NOT DETECTED Final   Enterobacteriaceae species NOT DETECTED NOT DETECTED Final   Enterobacter cloacae complex NOT DETECTED NOT DETECTED Final   Escherichia coli NOT DETECTED NOT DETECTED Final   Klebsiella oxytoca NOT DETECTED NOT DETECTED Final   Klebsiella pneumoniae NOT DETECTED NOT DETECTED Final   Proteus species NOT DETECTED NOT DETECTED Final   Serratia marcescens NOT DETECTED NOT DETECTED Final  Haemophilus influenzae NOT DETECTED NOT DETECTED Final   Neisseria meningitidis NOT DETECTED NOT DETECTED Final   Pseudomonas aeruginosa NOT DETECTED NOT DETECTED Final   Candida albicans NOT DETECTED NOT DETECTED Final   Candida glabrata NOT DETECTED NOT DETECTED Final   Candida krusei NOT DETECTED NOT DETECTED Final   Candida parapsilosis NOT DETECTED NOT DETECTED Final   Candida tropicalis NOT DETECTED NOT DETECTED Final  Blood Culture ID Panel (Reflexed)     Status: Abnormal   Collection Time: 09/20/17  3:40 AM  Result Value Ref Range Status   Enterococcus species NOT DETECTED NOT DETECTED Final   Listeria monocytogenes NOT DETECTED NOT DETECTED Final   Staphylococcus species DETECTED (A) NOT DETECTED Final    Comment: CRITICAL RESULT CALLED TO, READ BACK BY AND VERIFIED WITH: AMEYER,PHARMD '@2100'$  09/20/17 BY LHOWARD    Staphylococcus aureus DETECTED (A) NOT DETECTED Final    Comment: Methicillin (oxacillin)-resistant Staphylococcus aureus (MRSA). MRSA is predictably resistant to beta-lactam antibiotics (except ceftaroline). Preferred therapy is vancomycin unless clinically contraindicated. Patient requires contact precautions if  hospitalized. CRITICAL RESULT CALLED TO, READ BACK BY AND VERIFIED  WITH: AMEYER,PHARMD '@2100'$  09/20/17 BY LHOWARD    Methicillin resistance DETECTED (A) NOT DETECTED Final    Comment: CRITICAL RESULT CALLED TO, READ BACK BY AND VERIFIED WITH: AMEYER,PHARMD '@2100'$  09/20/17    Streptococcus species NOT DETECTED NOT DETECTED Final   Streptococcus agalactiae NOT DETECTED NOT DETECTED Final   Streptococcus pneumoniae NOT DETECTED NOT DETECTED Final   Streptococcus pyogenes NOT DETECTED NOT DETECTED Final   Acinetobacter baumannii NOT DETECTED NOT DETECTED Final   Enterobacteriaceae species NOT DETECTED NOT DETECTED Final   Enterobacter cloacae complex NOT DETECTED NOT DETECTED Final   Escherichia coli NOT DETECTED NOT DETECTED Final   Klebsiella oxytoca NOT DETECTED NOT DETECTED Final   Klebsiella pneumoniae NOT DETECTED NOT DETECTED Final   Proteus species NOT DETECTED NOT DETECTED Final   Serratia marcescens NOT DETECTED NOT DETECTED Final   Haemophilus influenzae NOT DETECTED NOT DETECTED Final   Neisseria meningitidis NOT DETECTED NOT DETECTED Final   Pseudomonas aeruginosa NOT DETECTED NOT DETECTED Final   Candida albicans NOT DETECTED NOT DETECTED Final   Candida glabrata NOT DETECTED NOT DETECTED Final   Candida krusei NOT DETECTED NOT DETECTED Final   Candida parapsilosis NOT DETECTED NOT DETECTED Final   Candida tropicalis NOT DETECTED NOT DETECTED Final  Culture, respiratory (NON-Expectorated)     Status: None   Collection Time: 09/20/17  8:20 AM  Result Value Ref Range Status   Specimen Description TRACHEAL ASPIRATE  Final   Special Requests NONE  Final   Gram Stain   Final    ABUNDANT WBC PRESENT, PREDOMINANTLY PMN RARE SQUAMOUS EPITHELIAL CELLS PRESENT MODERATE GRAM POSITIVE COCCI IN PAIRS    Culture   Final    MODERATE METHICILLIN RESISTANT STAPHYLOCOCCUS AUREUS   Report Status 09/22/2017 FINAL  Final   Organism ID, Bacteria METHICILLIN RESISTANT STAPHYLOCOCCUS AUREUS  Final      Susceptibility   Methicillin resistant staphylococcus  aureus - MIC*    CIPROFLOXACIN >=8 RESISTANT Resistant     ERYTHROMYCIN >=8 RESISTANT Resistant     GENTAMICIN <=0.5 SENSITIVE Sensitive     OXACILLIN >=4 RESISTANT Resistant     TETRACYCLINE <=1 SENSITIVE Sensitive     VANCOMYCIN <=0.5 SENSITIVE Sensitive     TRIMETH/SULFA <=10 SENSITIVE Sensitive     CLINDAMYCIN RESISTANT Resistant     RIFAMPIN <=0.5 SENSITIVE Sensitive     Inducible Clindamycin POSITIVE  Resistant     * MODERATE METHICILLIN RESISTANT STAPHYLOCOCCUS AUREUS  MRSA PCR Screening     Status: Abnormal   Collection Time: 09/20/17  9:16 AM  Result Value Ref Range Status   MRSA by PCR POSITIVE (A) NEGATIVE Final    Comment:        The GeneXpert MRSA Assay (FDA approved for NASAL specimens only), is one component of a comprehensive MRSA colonization surveillance program. It is not intended to diagnose MRSA infection nor to guide or monitor treatment for MRSA infections. RESULT CALLED TO, READ BACK BY AND VERIFIED WITH: Jacolyn Reedy RN 11:20 09/20/17 ([email or web address])   Urine Culture     Status: None   Collection Time: 09/21/17 12:57 PM  Result Value Ref Range Status   Specimen Description URINE, CLEAN CATCH  Final   Special Requests NONE  Final   Culture NO GROWTH  Final   Report Status 09/22/2017 FINAL  Final  Culture, blood (routine x 2)     Status: None   Collection Time: 09/22/17  8:20 AM  Result Value Ref Range Status   Specimen Description BLOOD RIGHT HAND  Final   Special Requests IN PEDIATRIC BOTTLE Blood Culture adequate volume  Final   Culture NO GROWTH 5 DAYS  Final   Report Status 09/27/2017 FINAL  Final  Culture, blood (routine x 2)     Status: None   Collection Time: 09/22/17  8:30 AM  Result Value Ref Range Status   Specimen Description BLOOD LEFT HAND  Final   Special Requests IN PEDIATRIC BOTTLE Blood Culture adequate volume  Final   Culture NO GROWTH 5 DAYS  Final   Report Status 09/27/2017 FINAL  Final    Studies/Results: Ct Head Wo  Contrast  Result Date: 09/27/2017 CLINICAL DATA:  61 y/o M; altered mental status. Patient in respiratory distress and intubated. EXAM: CT HEAD WITHOUT CONTRAST TECHNIQUE: Contiguous axial images were obtained from the base of the skull through the vertex without intravenous contrast. COMPARISON:  09/22/2017 CT head. FINDINGS: Brain: No evidence of acute infarction, hemorrhage, hydrocephalus, extra-axial collection or mass lesion/mass effect. Stable chronic microvascular ischemic changes, multiple chronic infarcts within bilateral cerebellar hemispheres, right occipital lobe, left anterior temporal lobe, pons, and bilateral basal ganglia. Stable brain parenchymal volume loss. Vascular: Calcific atherosclerosis of carotid siphons. No hyperdense vessel identified. Skull: Normal. Negative for fracture or focal lesion. Sinuses/Orbits: Stable right partial mastoid opacification. Small bilateral maxillary sinus mucous retention cyst. Otherwise negative. Other: None. IMPRESSION: 1. No acute intracranial abnormality. 2. Stable chronic microvascular ischemic changes, multiple chronic infarctions, and brain parenchymal volume loss. Electronically Signed   By: Kristine Garbe M.D.   On: 09/27/2017 00:44   Dg Chest Port 1 View  Result Date: 09/28/2017 CLINICAL DATA:  Acute respiratory failure with hypoxia. Follow-up. Ventilator support. EXAM: PORTABLE CHEST 1 VIEW COMPARISON:  09/27/2017 FINDINGS: Endotracheal tube tip is 3 cm above the carina. Right arm PICC tip in the SVC above the right atrium. Persistent bilateral infiltrates most severe in the left lower lobe, similar to yesterday's study but improved compared to the study of 2 days ago. No worsening or new findings. IMPRESSION: Stable since yesterday. Improved since 2 days ago. Bilateral infiltrates left worse than right. Electronically Signed   By: Nelson Chimes M.D.   On: 09/28/2017 07:39   Dg Chest Port 1 View  Result Date: 09/27/2017 CLINICAL DATA:   Follow-up pneumonia EXAM: PORTABLE CHEST 1 VIEW COMPARISON:  09/26/2017 FINDINGS: Endotracheal tube is again noted  in satisfactory position. Right-sided PICC line is stable in appearance. Cardiac shadow remains enlarged. Diffuse bilateral infiltrates left greater than right are noted. Left-sided pleural effusion is noted as well. No acute bony abnormality is seen. IMPRESSION: No significant interval change from the prior exam. Electronically Signed   By: Inez Catalina M.D.   On: 09/27/2017 07:15   Dg Chest Port 1 View  Result Date: 09/27/2017 CLINICAL DATA:  Acute respiratory failure. EXAM: PORTABLE CHEST 1 VIEW COMPARISON:  09/26/2017 FINDINGS: Post intubation. Endotracheal tube terminates 2.3 cm above the carina. Right PICC line terminates at the cavoatrial junction. The cardiac silhouette is partially obscured by dense bilateral alveolar and interstitial opacities with central predominance. More confluent airspace consolidation the left lower lobe cannot be excluded. There are probably bilateral pleural effusions. No pneumothorax. Osseous structures are without acute abnormality. Soft tissues are grossly normal. IMPRESSION: Post intubation. Significant worsening of the aeration of the lungs with mixed pattern pulmonary edema and bilateral pleural effusions. More confluent airspace consolidation in the left lower lobe cannot be excluded. Electronically Signed   By: Fidela Salisbury M.D.   On: 09/27/2017 00:14     Assessment: MRSA pneumonia VDRF --> required reintubation HFrEF (20% with recent TEE) --> hypotensive requiring pressors again C perfringens bacteremia (CT A/P without identified source)  Apical mural thrombus, on heparin CKD3    Plan: Total days of antibiotics: 9 vanco, 7 flagyl  - Repeat BCx 11-30 NG final    - PICC in place 12/4    - Weaning now for possible extubation  - Defined source for MRSA bacteremia with PNA. No source on CT for C.Perfringens. TEE negative. - Continue  Vancomycin and Flagyl for bacteremia for 14 days with today as day 7.  - Cr improved; prev 1.65- 1.83 June 2018  Will sign off. Available as needed, thank you.   Janene Madeira, MSN, NP-C [facility] [place] Cell: [phone] Pager: [phone]  09/28/2017  9:26 AM

## 2017-09-28 NOTE — Progress Notes (Signed)
ANTICOAGULATION CONSULT NOTE - Follow Up Consult  Pharmacy Consult:  Heparin Indication:  History of atrial thrombus  Allergies  Allergen Reactions  . Codeine Anaphylaxis and Nausea And Vomiting  . Lisinopril Itching and Rash  . Hydrocodone Other (See Comments)    On MAR  . Hydrocodone-Acetaminophen Nausea And Vomiting    Cannot take any acetaminophen due to liver issues per sister  . Tegaderm Ag Mesh [Silver] Other (See Comments)    Allergy to "silver compounds" listed on MAR  . Tylenol [Acetaminophen] Nausea Only and Other (See Comments)    Cannot take any acetaminophen due to liver issues per sister    Patient Measurements: Height: 6' (182.9 cm) Weight: 217 lb 9.5 oz (98.7 kg) IBW/kg (Calculated) : 77.6 Heparin Dosing Weight:  97 kg  Vital Signs: Temp: 98 F (36.7 C) (12/06 1921) Temp Source: Oral (12/06 1921) BP: 118/80 (12/06 1800) Pulse Rate: 98 (12/06 1800)  Labs: Recent Labs    09/26/17 0421 09/26/17 2325 09/27/17 0213  09/27/17 1336 09/28/17 0317 09/28/17 1216 09/28/17 2000  HGB 10.1*  --  9.4*  --   --  10.0*  --   --   HCT 31.9*  --  29.0*  --   --  31.5*  --   --   PLT 215  --  219  --   --  259  --   --   HEPARINUNFRC 0.44  --   --    < >  --  0.89* 0.75* 0.63  CREATININE 1.93*  --  1.99*  --   --  2.02*  --   --   TROPONINI  --  0.48* 0.46*  --  0.29*  --   --   --    < > = values in this interval not displayed.    Estimated Creatinine Clearance: 46.1 mL/min (A) (by C-G formula based on SCr of 2.02 mg/dL (H)).  Assessment: 22 YOM with history of atrial thrombus to continue on IV heparin while Coumadin is on hold.  .  No bleeding reported.  Heparin level is at goal on 2350 units/hr. No issues noted.   Goal of Therapy:  Heparin level 0.3-0.7 units/ml Monitor platelets by anticoagulation protocol: Yes   Plan:  Continue heparin gtt at 2350 units/hr Daily heparin level, CBC  Sheppard Coil PharmD., BCPS Clinical Pharmacist Pager  [phone] 09/28/2017 9:31 PM

## 2017-09-29 ENCOUNTER — Inpatient Hospital Stay (HOSPITAL_COMMUNITY): Payer: Medicare Other

## 2017-09-29 ENCOUNTER — Other Ambulatory Visit: Payer: Self-pay

## 2017-09-29 LAB — BASIC METABOLIC PANEL
Anion gap: 6 (ref 5–15)
BUN: 77 mg/dL — AB (ref 6–20)
CHLORIDE: 114 mmol/L — AB (ref 101–111)
CO2: 25 mmol/L (ref 22–32)
CREATININE: 1.84 mg/dL — AB (ref 0.61–1.24)
Calcium: 8.7 mg/dL — ABNORMAL LOW (ref 8.9–10.3)
GFR calc Af Amer: 44 mL/min — ABNORMAL LOW (ref >60)
GFR calc non Af Amer: 38 mL/min — ABNORMAL LOW (ref >60)
GLUCOSE: 93 mg/dL (ref 65–99)
POTASSIUM: 3.2 mmol/L — AB (ref 3.5–5.1)
SODIUM: 145 mmol/L (ref 135–145)

## 2017-09-29 LAB — GLUCOSE, CAPILLARY
GLUCOSE-CAPILLARY: 94 mg/dL (ref 65–99)
GLUCOSE-CAPILLARY: 103 mg/dL — AB (ref 65–99)
GLUCOSE-CAPILLARY: 150 mg/dL — AB (ref 65–99)
GLUCOSE-CAPILLARY: 105 mg/dL — AB (ref 65–99)

## 2017-09-29 LAB — CBC
HEMATOCRIT: 31.2 % — AB (ref 39.0–52.0)
Hemoglobin: 9.9 g/dL — ABNORMAL LOW (ref 13.0–17.0)
MCH: 30.9 pg (ref 26.0–34.0)
MCHC: 31.7 g/dL (ref 30.0–36.0)
MCV: 97.5 fL (ref 78.0–100.0)
Platelets: 296 10*3/uL (ref 150–400)
RBC: 3.2 MIL/uL — ABNORMAL LOW (ref 4.22–5.81)
RDW: 15 % (ref 11.5–15.5)
WBC: 13.9 10*3/uL — ABNORMAL HIGH (ref 4.0–10.5)

## 2017-09-29 LAB — BLOOD GAS, ARTERIAL
Acid-Base Excess: 0.3 mmol/L (ref 0.0–2.0)
Bicarbonate: 24.2 mmol/L (ref 20.0–28.0)
DRAWN BY: 270271
FIO2: 36
O2 Saturation: 90.9 %
PATIENT TEMPERATURE: 98.6
PH ART: 7.428 (ref 7.350–7.450)
pCO2 arterial: 37.2 mmHg (ref 32.0–48.0)
pO2, Arterial: 60.6 mmHg — ABNORMAL LOW (ref 83.0–108.0)

## 2017-09-29 LAB — PHOSPHORUS: Phosphorus: 3.3 mg/dL (ref 2.5–4.6)

## 2017-09-29 LAB — POCT I-STAT 3, ART BLOOD GAS (G3+)
ACID-BASE DEFICIT: 4 mmol/L — AB (ref 0.0–2.0)
BICARBONATE: 22.8 mmol/L (ref 20.0–28.0)
O2 SAT: 99 %
PCO2 ART: 47.5 mmHg (ref 32.0–48.0)
PO2 ART: 136 mmHg — AB (ref 83.0–108.0)
Patient temperature: 98.6
TCO2: 24 mmol/L (ref 22–32)
pH, Arterial: 7.289 — ABNORMAL LOW (ref 7.350–7.450)

## 2017-09-29 LAB — LACTIC ACID, PLASMA: LACTIC ACID, VENOUS: 2 mmol/L — AB (ref 0.5–1.9)

## 2017-09-29 LAB — VANCOMYCIN, TROUGH: VANCOMYCIN TR: 21 ug/mL — AB (ref 15–20)

## 2017-09-29 LAB — COOXEMETRY PANEL
CARBOXYHEMOGLOBIN: 0.7 % (ref 0.5–1.5)
METHEMOGLOBIN: 1.2 % (ref 0.0–1.5)
O2 Saturation: 79.9 %
Total hemoglobin: 12 g/dL (ref 12.0–16.0)

## 2017-09-29 LAB — MAGNESIUM: MAGNESIUM: 2.1 mg/dL (ref 1.7–2.4)

## 2017-09-29 LAB — HEPARIN LEVEL (UNFRACTIONATED): Heparin Unfractionated: 0.49 IU/mL (ref 0.30–0.70)

## 2017-09-29 MED ORDER — FENTANYL CITRATE (PF) 100 MCG/2ML IJ SOLN
100.0000 ug | Freq: Once | INTRAMUSCULAR | Status: AC
  Administered 2017-09-29: 100 ug via INTRAVENOUS

## 2017-09-29 MED ORDER — VECURONIUM BROMIDE 10 MG IV SOLR
10.0000 mg | Freq: Once | INTRAVENOUS | Status: AC
  Administered 2017-09-29: 10 mg via INTRAVENOUS
  Filled 2017-09-29 (×2): qty 10

## 2017-09-29 MED ORDER — POTASSIUM CHLORIDE 20 MEQ/15ML (10%) PO SOLN
40.0000 meq | Freq: Once | ORAL | Status: AC
  Administered 2017-09-29: 40 meq
  Filled 2017-09-29: qty 30

## 2017-09-29 MED ORDER — ROCURONIUM BROMIDE 50 MG/5ML IV SOLN
100.0000 mg | Freq: Once | INTRAVENOUS | Status: DC
  Filled 2017-09-29: qty 10

## 2017-09-29 MED ORDER — MIDAZOLAM HCL 2 MG/2ML IJ SOLN
2.0000 mg | Freq: Once | INTRAMUSCULAR | Status: AC
  Administered 2017-09-29: 2 mg via INTRAVENOUS

## 2017-09-29 MED ORDER — ETOMIDATE 2 MG/ML IV SOLN
40.0000 mg | Freq: Once | INTRAVENOUS | Status: AC
  Administered 2017-09-29: 20 mg via INTRAVENOUS
  Filled 2017-09-29 (×2): qty 20

## 2017-09-29 MED ORDER — HEPARIN (PORCINE) IN NACL 100-0.45 UNIT/ML-% IJ SOLN
2450.0000 [IU]/h | INTRAMUSCULAR | Status: DC
  Administered 2017-09-29 (×2): 2350 [IU]/h via INTRAVENOUS
  Administered 2017-09-30 (×2): 2450 [IU]/h via INTRAVENOUS
  Filled 2017-09-29 (×8): qty 250

## 2017-09-29 MED ORDER — PROPOFOL 500 MG/50ML IV EMUL
5.0000 ug/kg/min | INTRAVENOUS | Status: DC
  Administered 2017-09-29: 10 ug/kg/min via INTRAVENOUS
  Filled 2017-09-29 (×2): qty 50

## 2017-09-29 MED ORDER — ROCURONIUM BROMIDE 50 MG/5ML IV SOLN
50.0000 mg | Freq: Once | INTRAVENOUS | Status: AC
  Administered 2017-09-29: 50 mg via INTRAVENOUS
  Filled 2017-09-29: qty 5

## 2017-09-29 MED ORDER — FUROSEMIDE 10 MG/ML IJ SOLN
40.0000 mg | Freq: Once | INTRAMUSCULAR | Status: AC
  Administered 2017-09-29: 40 mg via INTRAVENOUS

## 2017-09-29 MED ORDER — FENTANYL CITRATE (PF) 100 MCG/2ML IJ SOLN
200.0000 ug | Freq: Once | INTRAMUSCULAR | Status: AC
  Administered 2017-09-29: 100 ug via INTRAVENOUS
  Filled 2017-09-29: qty 4

## 2017-09-29 MED ORDER — FAMOTIDINE 40 MG/5ML PO SUSR
20.0000 mg | Freq: Two times a day (BID) | ORAL | Status: DC
  Administered 2017-09-29 – 2017-10-02 (×7): 20 mg
  Filled 2017-09-29 (×8): qty 2.5

## 2017-09-29 MED ORDER — VANCOMYCIN HCL 500 MG IV SOLR
500.0000 mg | INTRAVENOUS | Status: AC
  Administered 2017-09-29 – 2017-10-05 (×7): 500 mg via INTRAVENOUS
  Filled 2017-09-29 (×8): qty 500

## 2017-09-29 MED ORDER — ETOMIDATE 2 MG/ML IV SOLN
20.0000 mg | Freq: Once | INTRAVENOUS | Status: AC
  Administered 2017-09-29: 20 mg via INTRAVENOUS

## 2017-09-29 MED ORDER — MIDAZOLAM HCL 2 MG/2ML IJ SOLN
4.0000 mg | Freq: Once | INTRAMUSCULAR | Status: AC
  Administered 2017-09-29: 2 mg via INTRAVENOUS
  Filled 2017-09-29: qty 4

## 2017-09-29 MED ORDER — FUROSEMIDE 10 MG/ML IJ SOLN
INTRAMUSCULAR | Status: AC
  Filled 2017-09-29: qty 4

## 2017-09-29 NOTE — Progress Notes (Signed)
PULMONARY / CRITICAL CARE MEDICINE   Name: Curtis Keller MRN: [id number] DOB: 1956/01/01    ADMISSION DATE:  09/20/2017 CONSULTATION DATE:  10/20/2017  REFERRING MD:  ED  CHIEF COMPLAINT:  Shortness of breath  HISTORY OF PRESENT ILLNESS:   61 yr old M, SNF resident,  with PMH of COPD, CHF EF 20%, CAD, apical mural thrombus admitted 11/28 with reports of altered mental status for 2 days which was thought to be due to UTI. He was altered as per his sister for few days and the morning of admit he started having severe shortness of breath saturating in the 50s with minimal improvemnet on CPAP.  He was intubated in the ED which was very difficult as per the ED physician. Please refer to their note. CXR concerning for bilateral infiltrates, treated with cefepime and vancomycin.    SUBJECTIVE:  RN reports pt developed worsening respiratory distress this am, required re-intubation.  K replaced per Pola CornELINK.   VITAL SIGNS: BP 130/87   Pulse (!) 115   Temp 98.1 F (36.7 C) (Oral)   Resp (!) 25   Ht 6' (1.829 m)   Wt 219 lb 2.2 oz (99.4 kg)   SpO2 96%   BMI 29.72 kg/m   HEMODYNAMICS:    VENTILATOR SETTINGS: Vent Mode: CPAP;PSV FiO2 (%):  [40 %] 40 % PEEP:  [5 cmH20] 5 cmH20 Pressure Support:  [5 cmH20] 5 cmH20  INTAKE / OUTPUT: I/O last 3 completed shifts: In: 6458.8 [I.V.:3128.5; Other:130; NG/GT:2800.3; IV Piggyback:400] Out: 1945 [Urine:1945]  PHYSICAL EXAMINATION: General:  Chronically ill appearing male in NAD, lying in bed on vent  HEENT: MM pink/moist, ETT, old trach scar Neuro: sedate  CV: s1s2 rrr, tachy, no m/r/g PULM: even/non-labored, lungs bilaterally clear  NG:EXBMGI:soft, non-tender, bsx4 active  Extremities: warm/dry, no edema  Skin: no rashes or lesions, tattoo on left chest, sacral wound dressing intact  LABS:  BMET Recent Labs  Lab 09/27/17 0213 09/28/17 0317 09/29/17 0430  NA 142 142 145  K 4.0 4.1 3.2*  CL 108 111 114*  CO2 26 23 25   BUN 79* 86* 77*   CREATININE 1.99* 2.02* 1.84*  GLUCOSE 107* 147* 93    Electrolytes Recent Labs  Lab 09/25/17 0327 09/26/17 0421 09/27/17 0213 09/28/17 0317 09/29/17 0430  CALCIUM 9.0 8.8* 8.6* 8.9 8.7*  MG 2.4 2.2 2.2  --  2.1  PHOS 3.5  --  4.6  --  3.3    CBC Recent Labs  Lab 09/27/17 0213 09/28/17 0317 09/29/17 0430  WBC 8.7 12.6* 13.9*  HGB 9.4* 10.0* 9.9*  HCT 29.0* 31.5* 31.2*  PLT 219 259 296    Coag's No results for input(s): APTT, INR in the last 168 hours.  Sepsis Markers No results for input(s): LATICACIDVEN, PROCALCITON, O2SATVEN in the last 168 hours.  ABG Recent Labs  Lab 09/26/17 0028 09/29/17 0402  PHART 7.359 7.428  PCO2ART 48.1* 37.2  PO2ART 215* 60.6*    Liver Enzymes Recent Labs  Lab 09/27/17 0213  ALBUMIN 2.0*    Cardiac Enzymes Recent Labs  Lab 09/26/17 2325 09/27/17 0213 09/27/17 1336  TROPONINI 0.48* 0.46* 0.29*    Glucose Recent Labs  Lab 09/28/17 1109 09/28/17 1544 09/28/17 1916 09/28/17 2310 09/29/17 0343 09/29/17 0718  GLUCAP 163* 118* 115* 108* 94 103*    Imaging Dg Chest Port 1 View  Result Date: 09/29/2017 CLINICAL DATA:  Acute respiratory failure with hypoxia. EXAM: PORTABLE CHEST 1 VIEW COMPARISON:  Radiograph September 28, 2017.  FINDINGS: Stable cardiomegaly. Increased diffuse interstitial densities are noted throughout both lungs most consistent with worsening edema or pneumonia. Endotracheal tube has been removed. Right-sided PICC line is unchanged in position. No pneumothorax is noted. No large pleural effusion is noted. Bony thorax is unremarkable. IMPRESSION: Endotracheal tube has been removed. Increased bilateral diffuse interstitial densities are noted consistent with worsening edema or pneumonia. Electronically Signed   By: Lupita Raider, M.D.   On: 09/29/2017 07:42   CULTURES Sputum culture 11/28 >> MRSA BCx2 11/28 >> MRSA, C perfringens BCx2 11/30 >> negative  ANTIBIOTICS: Zosyn 11/28 >> 11/29 Vanc  11/28 >> Flagyl 11/30 >>   EVENTS 11/28  Admit 12/03  Extubated  12/04  Re-intubated (suspect medication related) 12/05  TEE   STUDIES TEE 12/6 >> diffuse hypokinesis, LVEF 20-25%, may be a small calcified LV thrombus at the apex, no vegetation  LINES RUE PICC 12/4 >> ETT 12/2 >> 12/3 ETT 12/4 >> 12/6 ETT 12/7 >>  ASSESSMENT / PLAN: 61 year old SNF resident with multiple medical issues admitted with MRSA and Clostridium bacteremia, & MRSA HCAP.   Acute hypoxemic respiratory failure in the setting of diffuse pulmonary infiltrates  MRSA pneumonia with ALI  Difficult Intubation  Left Pleural Effusion  Pulmonary Edema  P: PRVC 8 cc/kg  PSV as tolerated  Failed extubation x2 > discussed trach with sister, she wants to proceed ABX as outlined per ID, stop dates in place for two weeks Follow intermittent CXR  Lasix 40 mg IV x1 Assess ABG  Septic Shock - volume responsive w/ Clostridium Perfringens and MRSA bacteremia  Systolic HFrEF (20%). Repeat echo shows unchaged EF Elevated troponin, suspect demand ischemia he is not a candidate for intervention; this has trended down  P: ICU monitoring  KVO IVF TEE as above  Appreciate CHF Service Hold heparin for planned trach   Assess lactic acid, SVO2  History of left ventricular thrombus, on chronic warfarin therapy Anemia  P: Heparin gtt per pharmacy  Convert back to coumadin once off flagyl  Trend CBC    Acute on chronic kidney disease  Mild hyperchloremia  P: Free water 200 ml Q8  Trend BMP / urinary output Replace electrolytes as indicated, KCL 12/7 Avoid nephrotoxic agents, ensure adequate renal perfusion  Metabolic encephalopathy; with history of stroke and left-sided hemiplegia since June 2018.  CT head neg. P: PT efforts  Hold home zanaflex > if restarted would reduce to lowest dose (oversedate with restart)  Valium  QD (home med)  PRN fentanyl for pain  PRN versed for sedation   Constipation Hx of  dysphasia; has chronic PEG, tolerates soft diet at SNF Hx of Hep C P: Hold TF for trach  Resume once trach placed  TF per Nutrition  PEG care per protocol  Pepcid for SUP  Senokot, hold for diarrhea   DM P: CBG with SSI   Sacral Decubitus - moisture associated  P: Frequent turning  WOC following  Space boots for heel protection  Foam dressing to sacrum, heels  Hx Mechanical Falls with Prior Hip Fracture P: Monitor / fall precautions  PT efforts as able   FAMILY  - Updates:  Sister called and updated on status 12/7.  Plan to proceed with trach this am.    - Global:  ICU disposition.    Canary Brim, NP-C [place] [facility] Pgr: [phone] or if no answer [phone] 09/29/2017, 8:49 AM  Attending Note:  61 year old male with CHF who presents  with pulmonary edema and pneumonia.  Patient was extubated for the 2nd time on 12/6 and again developed respiratory failure this AM and was intubated.  On exam, patient is unresponsive and tachypnic.  I reviewed CXR myself, pulmonary edema noted.  Discussed with PCCM-NP.  Will proceed with tracheostomy today.  Continue weaning after trach.  Diureses as ordered.  Replace electrolytes.  This is the third tracheostomy, should never be decannulated again.  Hold in the ICU.  The patient is critically ill with multiple organ systems failure and requires high complexity decision making for assessment and support, frequent evaluation and titration of therapies, application of advanced monitoring technologies and extensive interpretation of multiple databases.   Critical Care Time devoted to patient care services described in this note is  35  Minutes. This time reflects time of care of this signee Dr Koren Bound. This critical care time does not reflect procedure time, or teaching time or supervisory time of PA/NP/Med student/Med Resident etc but could involve care discussion time.  Alyson Reedy, M.D. [facility] Pulmonary/Critical  Care Medicine. Pager: [phone]. After hours pager: [phone].

## 2017-09-29 NOTE — Procedures (Signed)
Bronchoscopy Procedure Note KAMYRON GEORGER [id number] 12-25-1955  Procedure: Bronchoscopy Indications: Diagnostic evaluation of the airways and Obtain specimens for culture and/or other diagnostic studies  Procedure Details Consent: Risks of procedure as well as the alternatives and risks of each were explained to the (patient/caregiver).  Consent for procedure obtained. Time Out: Verified patient identification, verified procedure, site/side was marked, verified correct patient position, special equipment/implants available, medications/allergies/relevent history reviewed, required imaging and test results available.  Performed  In preparation for procedure, patient was given 100% FiO2 and bronchoscope lubricated. Sedation: Benzodiazepines, Muscle relaxants, Etomidate and Fentanyl  Airway entered and the following bronchi were examined: RUL, RML, RLL, LUL, LLL and Bronchi.   Procedure done as a part of intubation due to difficult airway. Bronchoscope removed.    Evaluation Hemodynamic Status: BP stable throughout; O2 sats: stable throughout Patient's Current Condition: stable Specimens:  None Complications: No apparent complications Patient did tolerate procedure well.   Koren Bound 09/29/2017

## 2017-09-29 NOTE — Procedures (Signed)
Bronchoscopy Procedure Note SREEKAR MANNER [id number] [birth year]  Procedure: Bronchoscopy Indications: Placement of Tracheostomy   Procedure Details Consent: Risks of procedure as well as the alternatives and risks of each were explained to the (patient/caregiver).  Consent for procedure obtained. Time Out: Verified patient identification, verified procedure, site/side was marked, verified correct patient position, special equipment/implants available, medications/allergies/relevent history reviewed, required imaging and test results available.  Performed  In preparation for procedure, patient was given 100% FiO2. Sedation: Etomidate and Propofol, fentanyl  Airway entered and the following bronchi were examined: upper airway visualized to carina for direct visualization of tracheostomy placement   Procedures performed: #6 Tracheostomy placed per Dr. Molli Knock.   Evaluation Hemodynamic Status: BP stable throughout; O2 sats: stable throughout Patient's Current Condition: stable Specimens:  None Complications: No apparent complications Patient tolerated procedure well.   Procedure performed under direct supervision of Dr. Molli Knock.    Canary Brim, NP-C [place] [facility] Pgr: [phone] or if no answer [phone] 09/29/2017, 1:17 PM  I was present and supervised the entire procedure  Alyson Reedy, M.D. [facility] Pulmonary/Critical Care Medicine. Pager: [phone]. After hours pager: [phone].

## 2017-09-29 NOTE — Progress Notes (Signed)
OT Cancellation Note  Patient Details Name: Curtis Keller MRN: [id number] DOB: Jan 24, 1956   Cancelled Treatment:    Reason Eval/Treat Not Completed: Medical issues which prohibited therapy. Pt reintubated. Canceled for third consecutive day. Signing off, please reorder as appropriate.  Evern Bio 09/29/2017, 8:28 AM  09/29/2017 Martie Round, OTR/L Pager: [phone]

## 2017-09-29 NOTE — Progress Notes (Addendum)
ANTICOAGULATION CONSULT NOTE - Follow Up Consult  Pharmacy Consult:  Heparin Indication:  History of atrial thrombus  Allergies  Allergen Reactions  . Codeine Anaphylaxis and Nausea And Vomiting  . Lisinopril Itching and Rash  . Hydrocodone Other (See Comments)    On MAR  . Hydrocodone-Acetaminophen Nausea And Vomiting    Cannot take any acetaminophen due to liver issues per sister  . Tegaderm Ag Mesh [Silver] Other (See Comments)    Allergy to "silver compounds" listed on MAR  . Tylenol [Acetaminophen] Nausea Only and Other (See Comments)    Cannot take any acetaminophen due to liver issues per sister    Patient Measurements: Height: 6' (182.9 cm) Weight: 219 lb 2.2 oz (99.4 kg) IBW/kg (Calculated) : 77.6 Heparin Dosing Weight:  97 kg  Vital Signs: Temp: 98.1 F (36.7 C) (12/07 0721) Temp Source: Oral (12/07 0721) BP: 130/87 (12/07 0600) Pulse Rate: 115 (12/07 0600)  Labs: Recent Labs    09/26/17 2325  09/27/17 0213  09/27/17 1336 09/28/17 0317 09/28/17 1216 09/28/17 2000 09/29/17 0430 09/29/17 0500  HGB  --    < > 9.4*  --   --  10.0*  --   --  9.9*  --   HCT  --   --  29.0*  --   --  31.5*  --   --  31.2*  --   PLT  --   --  219  --   --  259  --   --  296  --   HEPARINUNFRC  --   --   --    < >  --  0.89* 0.75* 0.63  --  0.49  CREATININE  --   --  1.99*  --   --  2.02*  --   --  1.84*  --   TROPONINI 0.48*  --  0.46*  --  0.29*  --   --   --   --   --    < > = values in this interval not displayed.    Estimated Creatinine Clearance: 50.8 mL/min (A) (by C-G formula based on SCr of 1.84 mg/dL (H)).     Assessment: 23 YOM with history of atrial thrombus to continue on IV heparin while Coumadin is on hold.  Heparin is therapeutic; no bleeding reported.  RN reported that heparin is infusing through the central line and lab was also drawn through the central line.  Proper procedure was conducted for lab draw.     Goal of Therapy:  Heparin level 0.3-0.7  units/ml Monitor platelets by anticoagulation protocol: Yes    Plan:  Continue heparin gtt at 2350 units/hr Daily heparin level, CBC   Jahmar Mckelvy D. Laney Potash, PharmD, BCPS Pager:  [phone] 09/29/2017, 8:10 AM    ===========================  Addendum:  Patient required emergent intubation, then also got trached Heparin was off prior to procedure  Plan: Per MD, resume heparin gtt at 2000 today >> resume heparin gtt at previous rate of 2350 units/hr F/U AM labs Daily heparin level, CBC   Betul Brisky D. Laney Potash, PharmD, BCPS Pager:  [phone] 09/29/2017, 12:33 PM

## 2017-09-29 NOTE — Progress Notes (Signed)
Pt remains intubated and not yet ready for SNF at this time. CSW will continue to follow for any needs at this time.     Claude Manges Dasani Crear, MSW, LCSW-A Emergency Department Clinical Social Worker [phone]

## 2017-09-29 NOTE — Progress Notes (Signed)
eLink Physician-Brief Progress Note Patient Name: Curtis Keller DOB: July 04, 1956 MRN: [id number]   Date of Service  09/29/2017  HPI/Events of Note  K 3.2 & creatinine 1.8. Has PEG tube.  eICU Interventions  KCl 40 mEq via tube ordered      Intervention Category Intermediate Interventions: Electrolyte abnormality - evaluation and management  Lawanda Cousins 09/29/2017, 6:32 AM

## 2017-09-29 NOTE — Procedures (Signed)
Intubation Procedure Note Curtis Keller [id number] [birth year]  Procedure: Intubation Indications: Respiratory insufficiency  Procedure Details Consent: Unable to obtain consent because of emergent medical necessity. Time Out: Verified patient identification, verified procedure, site/side was marked, verified correct patient position, special equipment/implants available, medications/allergies/relevent history reviewed, required imaging and test results available.  Performed  Maximum sterile technique was used including antiseptics, gloves, hand hygiene and mask.  MAC    Evaluation Hemodynamic Status: BP stable throughout; O2 sats: stable throughout Patient's Current Condition: stable Complications: No apparent complications Patient did tolerate procedure well. Chest X-ray ordered to verify placement.  CXR: pending.   Curtis Keller 09/29/2017

## 2017-09-29 NOTE — Progress Notes (Signed)
PT Cancellation Note  Patient Details Name: NEMANJA EPP MRN: [id number] DOB: July 31, 1956   Cancelled Treatment:    Reason Eval/Treat Not Completed: Patient not medically ready. Pt being intubated. PT will continue to f/u as appropriate and available.    Alessandra Bevels Janina Trafton 09/29/2017, 8:28 AM

## 2017-09-29 NOTE — Progress Notes (Signed)
Patient ID: Curtis Keller, male   DOB: 07-30-1956, 61 y.o.   MRN: [id number]      Advanced Heart Failure Rounding Note   Subjective:    Patient was extubated yesterday. This morning, he is in respiratory distress on NRBM with oxygen saturation in 80s.  He is agitated and HR in 120s, rhythm difficult to discern with many PVCs and ?flutter versus sinus tachy.   CXR with increased bilateral interstitial infiltrates.    Objective:   Weight Range: 219 lb 2.2 oz (99.4 kg) Body mass index is 29.72 kg/m.   Vital Signs:   Temp:  [97.4 F (36.3 C)-98.6 F (37 C)] 98.1 F (36.7 C) (12/07 0721) Pulse Rate:  [39-117] 115 (12/07 0600) Resp:  [13-25] 25 (12/07 0600) BP: (80-131)/(56-93) 130/87 (12/07 0600) SpO2:  [89 %-98 %] 96 % (12/07 0600) FiO2 (%):  [40 %] 40 % (12/06 1600) Weight:  [219 lb 2.2 oz (99.4 kg)] 219 lb 2.2 oz (99.4 kg) (12/07 0413) Last BM Date: 09/28/17  Weight change: Filed Weights   09/27/17 0430 09/28/17 0500 09/29/17 0413  Weight: 213 lb 6.5 oz (96.8 kg) 217 lb 9.5 oz (98.7 kg) 219 lb 2.2 oz (99.4 kg)    Intake/Output:   Intake/Output Summary (Last 24 hours) at 09/29/2017 0826 Last data filed at 09/29/2017 0600 Gross per 24 hour  Intake 3693.17 ml  Output 1160 ml  Net 2533.17 ml      Physical Exam    General:  Tachypneic, agitated.  HEENT: Normal Neck: Supple. JVP difficult. No lymphadenopathy or thyromegaly appreciated. Cor: PMI nondisplaced. Tachy, irregular rate & rhythm. No rubs, gallops or murmurs. Lungs: Bilateral rhonchi Abdomen: Soft, nontender, nondistended. No hepatosplenomegaly. No bruits or masses. Good bowel sounds. Extremities: No cyanosis, clubbing, rash, edema Neuro: Agitated, struggling, not answering questions   Telemetry   Tachy with PVCs, ?SVT versus sinus tachy   Labs    CBC Recent Labs    09/27/17 0213 09/28/17 0317 09/29/17 0430  WBC 8.7 12.6* 13.9*  NEUTROABS 4.4  --   --   HGB 9.4* 10.0* 9.9*  HCT 29.0* 31.5*  31.2*  MCV 98.3 97.5 97.5  PLT 219 259 937   Basic Metabolic Panel Recent Labs    09/27/17 0213 09/28/17 0317 09/29/17 0430  NA 142 142 145  K 4.0 4.1 3.2*  CL 108 111 114*  CO2 26 23 25   GLUCOSE 107* 147* 93  BUN 79* 86* 77*  CREATININE 1.99* 2.02* 1.84*  CALCIUM 8.6* 8.9 8.7*  MG 2.2  --  2.1  PHOS 4.6  --  3.3   Liver Function Tests Recent Labs    09/27/17 0213  ALBUMIN 2.0*   No results for input(s): LIPASE, AMYLASE in the last 72 hours. Cardiac Enzymes Recent Labs    09/26/17 2325 09/27/17 0213 09/27/17 1336  TROPONINI 0.48* 0.46* 0.29*    BNP: BNP (last 3 results) Recent Labs    03/17/17 1438 03/30/17 0629 09/20/17 0545  BNP 601.2* 199.5* 1,771.9*    ProBNP (last 3 results) No results for input(s): PROBNP in the last 8760 hours.   D-Dimer No results for input(s): DDIMER in the last 72 hours. Hemoglobin A1C No results for input(s): HGBA1C in the last 72 hours. Fasting Lipid Panel No results for input(s): CHOL, HDL, LDLCALC, TRIG, CHOLHDL, LDLDIRECT in the last 72 hours. Thyroid Function Tests No results for input(s): TSH, T4TOTAL, T3FREE, THYROIDAB in the last 72 hours.  Invalid input(s): FREET3  Other results:  Imaging    Dg Chest Port 1 View  Result Date: 09/29/2017 CLINICAL DATA:  Acute respiratory failure with hypoxia. EXAM: PORTABLE CHEST 1 VIEW COMPARISON:  Radiograph September 28, 2017. FINDINGS: Stable cardiomegaly. Increased diffuse interstitial densities are noted throughout both lungs most consistent with worsening edema or pneumonia. Endotracheal tube has been removed. Right-sided PICC line is unchanged in position. No pneumothorax is noted. No large pleural effusion is noted. Bony thorax is unremarkable. IMPRESSION: Endotracheal tube has been removed. Increased bilateral diffuse interstitial densities are noted consistent with worsening edema or pneumonia. Electronically Signed   By: Marijo Conception, M.D.   On: 09/29/2017 07:42        Medications:     Scheduled Medications: . atorvastatin  40 mg Per Tube QHS  . chlorhexidine gluconate (MEDLINE KIT)  15 mL Mouth Rinse BID  . Chlorhexidine Gluconate Cloth  6 each Topical Daily  . diazepam  2 mg Oral Daily  . famotidine  20 mg Per Tube BID  . feeding supplement (PRO-STAT SUGAR FREE 64)  30 mL Per Tube Daily  . free water  200 mL Per Tube Q8H  . insulin aspart  0-15 Units Subcutaneous Q4H  . mouth rinse  15 mL Mouth Rinse QID  . multivitamin  15 mL Per Tube Daily  . potassium chloride  40 mEq Per Tube Once  . senna-docusate  1 tablet Oral Daily  . sodium chloride flush  10-40 mL Intracatheter Q12H     Infusions: . sodium chloride Stopped (09/28/17 1900)  . sodium chloride 40 mL/hr at 09/29/17 0437  . DOPamine Stopped (09/27/17 1054)  . feeding supplement (VITAL AF 1.2 CAL) 1,000 mL (09/29/17 0426)  . heparin 2,350 Units/hr (09/28/17 2303)  . metronidazole Stopped (09/29/17 0241)  . norepinephrine (LEVOPHED) Adult infusion Stopped (09/27/17 2122)  . vancomycin       PRN Medications:  sodium chloride, fentaNYL (SUBLIMAZE) injection, midazolam, sodium chloride flush    Patient Profile   Curtis Keller is a 61 y.o. male with history of COPD, Systolic CHF EF 94%, CAD, hx of CVA with left sided weakness, h/o of prolonged vent wean s/p trach, peg s/p decannulation, h/o Hepatitis C, as well as known apical mural thrombus admitted with worsening SOB and AMS  Found to have MRSA PNA and C. Perfringens + MRSA bacteremia.   Assessment/Plan   1. Septic shock: Hypotensive initially, MRSA PNA with MRSA and C perfringens in blood.  He is now off pressors. BP stable today.  2. Acute hypoxemic respiratory failure: MRSA PNA.  He was extubated yesterday, now dyspneic, hypoxemic and struggling. CXR worse. CCM at bedside, plan to re-intubate.  3. Acute on chronic systolic CHF: History of ischemic cardiomyopathy.  TEE this admission with EF 20-25%.   - Will give  Lasix 40 mg IV x 1 now with respiratory distress.  - Would follow CVPs.    3. PVCs/short (3 beats) NSVT: Frequent PVCs currently in respiratory distress.    - When he is intubated and more stable, needs ECG for rhythm.  - If BP remains stable, would add Coreg at low dose and titrate up as tolerated given PVCs.  - I do not think that he will be an ICD candidate given baseline debilitation (left hemiparesis, PEG, living in SNF).  4. ID: No endocarditis on TEE.  Has MRSA PNA with MRSA and C perfringens in blood. 5. CAD: Has history of BMS to RCA in 2016.  Troponin to 2.6 this admission, could  be demand ischemia from septic shock.   - Continue statin.  - No ASA with full anticoagulation.  - Consider eventual cath but would need to see improved renal function.  6. AKI on CKD: Creatinine trending down, 1.8 today.   7. LV thrombus: Chronic warfarin, TEE appeared to show a small calcified LV thrombus.   - Currently on heparin gtt.   Length of Stay: 9  Loralie Champagne, MD  09/29/2017, 8:26 AM  Advanced Heart Failure Team Pager [phone] (M-F; 7a - 4p)  Please contact [facility] for night-coverage after hours (4p -7a ) and weekends on [email or web address]

## 2017-09-29 NOTE — Procedures (Signed)
Bedside Tracheostomy Insertion Procedure Note   Patient Details:   Name: TEODORO LOTA DOB: [birth year] MRN: [id number]  Procedure: Tracheostomy  Pre Procedure Assessment: ET Tube Size:7.5 ET Tube secured at lip (cm):24 Bite block in place: No Breath Sounds: Clear  Post Procedure Assessment: BP 121/83   Pulse 86   Temp 98.5 F (36.9 C) (Oral)   Resp 19   Ht 6' (1.829 m)   Wt 219 lb 2.2 oz (99.4 kg)   SpO2 96%   BMI 29.72 kg/m  O2 sats: stable throughout Complications: No apparent complications Patient did tolerate procedure well Tracheostomy Brand:Shiley Tracheostomy Style:Cuffed Tracheostomy Size: 8.0 Tracheostomy Secured XJD:[id number] Tracheostomy Placement Confirmation:Trach cuff visualized and in place    Lysbeth Penner [facility] 09/29/2017, 2:23 PM

## 2017-09-29 NOTE — Progress Notes (Signed)
Pharmacy Antibiotic Note  Curtis Keller is a 61 y.o. male admitted on 09/20/2017 with pneumonia and bacteremia.  Pharmacy has been consulted for Vancomycin dosing.   Vancomycin trough this AM is elevated at 21, Scr still elevated but improved from last week  Plan: -Change vancomycin to 500 mg IV q24h -Re-check trough as indicated -Plan is for 2 weeks vancomycin   Height: 6' (182.9 cm) Weight: 219 lb 2.2 oz (99.4 kg) IBW/kg (Calculated) : 77.6  Temp (24hrs), Avg:98 F (36.7 C), Min:97.4 F (36.3 C), Max:98.6 F (37 C)  Recent Labs  Lab 09/25/17 0327 09/26/17 0421 09/27/17 0213 09/28/17 0317 09/29/17 0430  WBC 10.7* 12.2* 8.7 12.6* 13.9*  CREATININE 2.08* 1.93* 1.99* 2.02* 1.84*  VANCOTROUGH  --  21*  --   --  21*    Estimated Creatinine Clearance: 50.8 mL/min (A) (by C-G formula based on SCr of 1.84 mg/dL (H)).    Allergies  Allergen Reactions  . Codeine Anaphylaxis and Nausea And Vomiting  . Lisinopril Itching and Rash  . Hydrocodone Other (See Comments)    On MAR  . Hydrocodone-Acetaminophen Nausea And Vomiting    Cannot take any acetaminophen due to liver issues per sister  . Tegaderm Ag Mesh [Silver] Other (See Comments)    Allergy to "silver compounds" listed on MAR  . Tylenol [Acetaminophen] Nausea Only and Other (See Comments)    Cannot take any acetaminophen due to liver issues per sister      Abran Duke 09/29/2017 6:22 AM

## 2017-09-29 NOTE — Procedures (Signed)
Percutaneous Tracheostomy Placement  Consent from family.  Patient sedated, paralyzed and position.  Placed on 100% FiO2 and RR matched.  Area cleaned and draped.  Lidocaine/epi injected.  Skin incision done followed by blunt dissection.  Trachea palpated then punctured, catheter passed and visualized bronchoscopically.  Wire placed and visualized.  Catheter removed.  Airway then crushed and dilated.  Size 8 cuffed shiley trach placed and visualized bronchoscopically well above carina.  Good volume returns.  Patient tolerated the procedure well without complications.  Minimal blood loss.  CXR ordered and pending.  Kissy Cielo G. Trenna Kiely, M.D. [place] [facility]. Pager: [phone]. After hours pager: [phone].  

## 2017-09-29 NOTE — Progress Notes (Signed)
SLP Cancellation Note  Patient Details Name: TIPTON GLAZENER MRN: [id number] DOB: [birth year]   Cancelled treatment:       Reason Eval/Treat Not Completed: Medical issues which prohibited therapy - reintubated. Will sign off. Please reorder when ready.   Maxcine Ham 09/29/2017, 8:47 AM  Maxcine Ham, M.A. CCC-SLP [phone]

## 2017-09-30 ENCOUNTER — Inpatient Hospital Stay (HOSPITAL_COMMUNITY): Payer: Medicare Other

## 2017-09-30 LAB — BASIC METABOLIC PANEL
ANION GAP: 7 (ref 5–15)
BUN: 76 mg/dL — ABNORMAL HIGH (ref 6–20)
CO2: 24 mmol/L (ref 22–32)
Calcium: 8.9 mg/dL (ref 8.9–10.3)
Chloride: 116 mmol/L — ABNORMAL HIGH (ref 101–111)
Creatinine, Ser: 1.98 mg/dL — ABNORMAL HIGH (ref 0.61–1.24)
GFR, EST AFRICAN AMERICAN: 40 mL/min — AB (ref >60)
GFR, EST NON AFRICAN AMERICAN: 34 mL/min — AB (ref >60)
Glucose, Bld: 140 mg/dL — ABNORMAL HIGH (ref 65–99)
POTASSIUM: 4 mmol/L (ref 3.5–5.1)
Sodium: 147 mmol/L — ABNORMAL HIGH (ref 135–145)

## 2017-09-30 LAB — CBC
HCT: 32.7 % — ABNORMAL LOW (ref 39.0–52.0)
HEMOGLOBIN: 10.3 g/dL — AB (ref 13.0–17.0)
MCH: 30.9 pg (ref 26.0–34.0)
MCHC: 31.5 g/dL (ref 30.0–36.0)
MCV: 98.2 fL (ref 78.0–100.0)
Platelets: 261 10*3/uL (ref 150–400)
RBC: 3.33 MIL/uL — AB (ref 4.22–5.81)
RDW: 15.2 % (ref 11.5–15.5)
WBC: 12.7 10*3/uL — AB (ref 4.0–10.5)

## 2017-09-30 LAB — GLUCOSE, CAPILLARY
GLUCOSE-CAPILLARY: 128 mg/dL — AB (ref 65–99)
GLUCOSE-CAPILLARY: 130 mg/dL — AB (ref 65–99)
GLUCOSE-CAPILLARY: 132 mg/dL — AB (ref 65–99)
Glucose-Capillary: 122 mg/dL — ABNORMAL HIGH (ref 65–99)
Glucose-Capillary: 124 mg/dL — ABNORMAL HIGH (ref 65–99)
Glucose-Capillary: 125 mg/dL — ABNORMAL HIGH (ref 65–99)
Glucose-Capillary: 145 mg/dL — ABNORMAL HIGH (ref 65–99)

## 2017-09-30 LAB — HEPARIN LEVEL (UNFRACTIONATED)
HEPARIN UNFRACTIONATED: 0.28 [IU]/mL — AB (ref 0.30–0.70)
HEPARIN UNFRACTIONATED: 0.44 [IU]/mL (ref 0.30–0.70)
Heparin Unfractionated: >2.2 IU/mL — ABNORMAL HIGH (ref 0.30–0.70)

## 2017-09-30 MED ORDER — FUROSEMIDE 10 MG/ML IJ SOLN
40.0000 mg | Freq: Three times a day (TID) | INTRAMUSCULAR | Status: AC
  Administered 2017-09-30 (×2): 40 mg via INTRAVENOUS
  Filled 2017-09-30 (×3): qty 4

## 2017-09-30 MED ORDER — FREE WATER
250.0000 mL | Freq: Four times a day (QID) | Status: DC
  Administered 2017-09-30 – 2017-10-03 (×12): 250 mL

## 2017-09-30 NOTE — Progress Notes (Signed)
ANTICOAGULATION CONSULT NOTE - Follow Up Consult  Pharmacy Consult:  Heparin Indication:  History of atrial thrombus  Patient Measurements: Height: 6' (182.9 cm) Weight: 210 lb 15.7 oz (95.7 kg) IBW/kg (Calculated) : 77.6 Heparin Dosing Weight:  97 kg  Vital Signs: Temp: 98.7 F (37.1 C) (12/08 0412) Temp Source: Oral (12/08 0412) BP: 125/78 (12/07 2300) Pulse Rate: 98 (12/07 2317)  Labs: Recent Labs    09/27/17 1336  09/28/17 0317  09/28/17 2000 09/29/17 0430 09/29/17 0500 09/30/17 0423  HGB  --    < > 10.0*  --   --  9.9*  --  10.3*  HCT  --   --  31.5*  --   --  31.2*  --  32.7*  PLT  --   --  259  --   --  296  --  261  HEPARINUNFRC  --   --  0.89*   < > 0.63  --  0.49 0.28*  CREATININE  --   --  2.02*  --   --  1.84*  --  1.98*  TROPONINI 0.29*  --   --   --   --   --   --   --    < > = values in this interval not displayed.    Estimated Creatinine Clearance: 46.4 mL/min (A) (by C-G formula based on SCr of 1.98 mg/dL (H)).  Assessment: Curtis Keller with history of atrial thrombus to continue on IV heparin while Coumadin is on hold.   Heparin level sub-therapeutic this AM after re-start s/p holding heparin for trach yesterday evening. No issues per RN.    Goal of Therapy:  Heparin level 0.3-0.7 units/ml Monitor platelets by anticoagulation protocol: Yes    Plan:  Inc heparin to 2450 units/hr 1300 HL  Abran Duke, PharmD, BCPS Clinical Pharmacist Phone: [phone]

## 2017-09-30 NOTE — Progress Notes (Signed)
PULMONARY / CRITICAL CARE MEDICINE   Name: Curtis Keller MRN: [id number] DOB: April 09, 1956    ADMISSION DATE:  09/20/2017 CONSULTATION DATE:  10/20/2017  REFERRING MD:  ED  CHIEF COMPLAINT:  Shortness of breath  HISTORY OF PRESENT ILLNESS:   61 yr old M, SNF resident,  with PMH of COPD, CHF EF 20%, CAD, apical mural thrombus admitted 11/28 with reports of altered mental status for 2 days which was thought to be due to UTI. He was altered as per his sister for few days and the morning of admit he started having severe shortness of breath saturating in the 50s with minimal improvemnet on CPAP.  He was intubated in the ED which was very difficult as per the ED physician. Please refer to their note. CXR concerning for bilateral infiltrates, treated with cefepime and vancomycin.    SUBJECTIVE:  No events overnight, NAD  VITAL SIGNS: BP (!) 138/96   Pulse 94   Temp 98.9 F (37.2 C) (Oral)   Resp (!) 22   Ht 6' (1.829 m)   Wt 95.7 kg (210 lb 15.7 oz)   SpO2 92%   BMI 28.61 kg/m   HEMODYNAMICS: CVP:  [8 mmHg-10 mmHg] 9 mmHg  VENTILATOR SETTINGS: Vent Mode: PRVC FiO2 (%):  [50 %-100 %] 50 % Set Rate:  [18 bmp-20 bmp] 18 bmp Vt Set:  [620 mL] 620 mL PEEP:  [5 cmH20] 5 cmH20 Plateau Pressure:  [16 cmH20-21 cmH20] 16 cmH20  INTAKE / OUTPUT: I/O last 3 completed shifts: In: 4086 [I.V.:951; Other:60; WU/JW:1191G/GT:2575; IV Piggyback:500] Out: 2105 [Urine:2105]  PHYSICAL EXAMINATION: General:  Chronically ill appearing male, NAD HEENT: [place]/AT, PERRL, EOM-I and MMM Neuro: Awake and interactive, moving all ext to command CV: RRR, Nl S1/S2, -M/R/G PULM: Coarse BS diffusely GI: Soft, NT, ND and +BS Extremities: -Edema and -Tenderness Skin: Intact  LABS:  BMET Recent Labs  Lab 09/28/17 0317 09/29/17 0430 09/30/17 0423  NA 142 145 147*  K 4.1 3.2* 4.0  CL 111 114* 116*  CO2 23 25 24   BUN 86* 77* 76*  CREATININE 2.02* 1.84* 1.98*  GLUCOSE 147* 93 140*   Electrolytes Recent  Labs  Lab 09/25/17 0327 09/26/17 0421 09/27/17 0213 09/28/17 0317 09/29/17 0430 09/30/17 0423  CALCIUM 9.0 8.8* 8.6* 8.9 8.7* 8.9  MG 2.4 2.2 2.2  --  2.1  --   PHOS 3.5  --  4.6  --  3.3  --    CBC Recent Labs  Lab 09/28/17 0317 09/29/17 0430 09/30/17 0423  WBC 12.6* 13.9* 12.7*  HGB 10.0* 9.9* 10.3*  HCT 31.5* 31.2* 32.7*  PLT 259 296 261   Coag's No results for input(s): APTT, INR in the last 168 hours.  Sepsis Markers Recent Labs  Lab 09/29/17 1031  LATICACIDVEN 2.0*   ABG Recent Labs  Lab 09/26/17 0028 09/29/17 0402 09/29/17 0952  PHART 7.359 7.428 7.289*  PCO2ART 48.1* 37.2 47.5  PO2ART 215* 60.6* 136.0*   Liver Enzymes Recent Labs  Lab 09/27/17 0213  ALBUMIN 2.0*   Cardiac Enzymes Recent Labs  Lab 09/26/17 2325 09/27/17 0213 09/27/17 1336  TROPONINI 0.48* 0.46* 0.29*   Glucose Recent Labs  Lab 09/29/17 0718 09/29/17 1147 09/29/17 2027 09/30/17 0019 09/30/17 0411 09/30/17 0903  GLUCAP 103* 150* 105* 124* 132* 125*   Imaging Dg Chest Port 1 View  Result Date: 09/30/2017 CLINICAL DATA:  61 year old male with acute respiratory failure and hypoxia EXAM: PORTABLE CHEST 1 VIEW COMPARISON:  Prior chest x-ray  09/29/2017 FINDINGS: Interval tracheostomy. The tip of the tracheostomy tube is midline and at the level of the clavicles. Stable position of right upper extremity approach PICC. Catheter tip overlies the cavoatrial junction. Similar degree of cardiomegaly. Persistent pulmonary vascular congestion with diffuse bilateral interstitial and airspace opacities. Bibasilar opacities are also present in remain consistent with layering effusions and atelectasis. No evidence of acute osseous abnormality. IMPRESSION: 1. Interval tracheostomy. The tracheostomy tube he is in good position. 2. Stable cardiomegaly, pulmonary edema and bilateral layering pleural effusions and associated bibasilar atelectasis. Electronically Signed   By: Malachy Moan M.D.    On: 09/30/2017 09:10   CULTURES Sputum culture 11/28 >> MRSA BCx2 11/28 >> MRSA, C perfringens BCx2 11/30 >> negative  ANTIBIOTICS: Zosyn 11/28 >> 11/29 Vanc 11/28 >> Flagyl 11/30 >>   EVENTS 11/28  Admit 12/03  Extubated  12/04  Re-intubated (suspect medication related) 12/05  TEE   STUDIES TEE 12/6 >> diffuse hypokinesis, LVEF 20-25%, may be a small calcified LV thrombus at the apex, no vegetation  LINES RUE PICC 12/4 >> ETT 12/2 >> 12/3 ETT 12/4 >> 12/6 ETT 12/7 >>12/7 Trach 12/7>>>  ASSESSMENT / PLAN: 61 year old SNF resident with multiple medical issues admitted with MRSA and Clostridium bacteremia, & MRSA HCAP.  Acute hypoxemic respiratory failure in the setting of diffuse pulmonary infiltrates  MRSA pneumonia with ALI  Difficult Intubation  Left Pleural Effusion  Pulmonary Edema  P: Titrate O2 for sat of 88-92% PSV as tolerated  Trach, decannulation ABX as outlined per ID, stop dates in place for two weeks Follow intermittent CXR  Lasix 40 mg IV x2 Assess ABG  Septic Shock - volume responsive w/ Clostridium Perfringens and MRSA bacteremia  Systolic HFrEF (20%). Repeat echo shows unchaged EF Elevated troponin, suspect demand ischemia he is not a candidate for intervention; this has trended down  P: ICU monitoring  KVO IVF TEE noted, appreciate input Appreciate input form CHF service Hold heparin for planned trach   Assess lactic acid, SVO2  History of left ventricular thrombus, on chronic warfarin therapy Anemia  P: Heparin GTT per pharmacy Convert back to coumadin once off flagyl  Trend CBC    Acute on chronic kidney disease  Mild hyperchloremia  P: Increase free water to 250 ml water q6 Trend BMP/urinary output Replace electrolytes as indicated Avoid nephrotoxic agents, ensure adequate renal perfusion  Metabolic encephalopathy; with history of stroke and left-sided hemiplegia since June 2018.  CT head neg. P: PT efforts  Hold home  zanaflex > if restarted would reduce to lowest dose (oversedate with restart)  Valium 2mg  QD (home med)  PRN fentanyl for pain  PRN versed for sedation   Constipation Hx of dysphasia; has chronic PEG, tolerates soft diet at SNF Hx of Hep C P: Resume TF TF per Nutrition  PEG care per protocol  Pepcid for SUP  Senokot, hold for diarrhea   DM P: CBG with SSI   Sacral Decubitus - moisture associated  P: Frequent turning  WOC following  Space boots for heel protection  Foam dressing to sacrum, heels  Hx Mechanical Falls with Prior Hip Fracture P: Monitor / fall precautions  PT efforts as able   FAMILY  - Updates:  Patient updated bedside, will proceed with PEG on Monday and placement.  - Global:  ICU disposition.    The patient is critically ill with multiple organ systems failure and requires high complexity decision making for assessment and support, frequent evaluation and titration  of therapies, application of advanced monitoring technologies and extensive interpretation of multiple databases.   Critical Care Time devoted to patient care services described in this note is  35  Minutes. This time reflects time of care of this signee Dr [name]. This critical care time does not reflect procedure time, or teaching time or supervisory time of PA/NP/Med student/Med Resident etc but could involve care discussion time.  Rush Farmer, M.D. [facility]. Pager: [phone]. After hours pager: [phone].

## 2017-09-30 NOTE — Progress Notes (Signed)
Pt temp abnormal. RN notified.

## 2017-09-30 NOTE — Plan of Care (Signed)
  Not Progressing Safety: Ability to remain free from injury will improve 09/30/2017 0733 - Not Progressing by Noe Gens, RN Note Patient is restless, requiring fentanyl and versed during shift to allow patient to rest.  Skin Integrity: Risk for impaired skin integrity will decrease 09/30/2017 0733 - Not Progressing by Castiel Lauricella A, RN Note Patient with stage 2 to sacrum and DTI to heels. Patient frequently trying to slide self down in bed and kicking heels against bed. Frequent reminders to patient, but no evidence of understanding. Foam changed on bottom.

## 2017-09-30 NOTE — Progress Notes (Signed)
ANTICOAGULATION CONSULT NOTE - Follow Up Consult  Pharmacy Consult:  Heparin Indication:  History of atrial thrombus  Patient Measurements: Height: 6' (182.9 cm) Weight: 210 lb 15.7 oz (95.7 kg) IBW/kg (Calculated) : 77.6 Heparin Dosing Weight:  97 kg  Vital Signs: Temp: 101.4 F (38.6 C) (12/08 1244) Temp Source: Oral (12/08 1244) BP: 159/98 (12/08 1400) Pulse Rate: 104 (12/08 1400)  Labs: Recent Labs    09/28/17 0317  09/29/17 0430  09/30/17 0423 09/30/17 1303 09/30/17 1441  HGB 10.0*  --  9.9*  --  10.3*  --   --   HCT 31.5*  --  31.2*  --  32.7*  --   --   PLT 259  --  296  --  261  --   --   HEPARINUNFRC 0.89*   < >  --    < > 0.28* >2.20* 0.44  CREATININE 2.02*  --  1.84*  --  1.98*  --   --    < > = values in this interval not displayed.    Estimated Creatinine Clearance: 46.4 mL/min (A) (by C-G formula based on SCr of 1.98 mg/dL (H)).  Assessment: 39 YOM with history of atrial thrombus to continue on IV heparin while Coumadin is on hold.   Heparin level now within goal on 2450 units/hr at 0.44 (level of > 2.2 was drawn with infusion of drug, so not accurate)  Goal of Therapy:  Heparin level 0.3-0.7 units/ml Monitor platelets by anticoagulation protocol: Yes  Plan:  Continue heparin 2450 units/hr Daily HL CBC  Isaac Bliss, PharmD, BCPS, BCCCP Clinical Pharmacist Clinical phone for 09/30/2017 from 7a-3:30p: [phone] If after 3:30p, please call main pharmacy at: [phone] 09/30/2017 3:18 PM

## 2017-10-01 DIAGNOSIS — Z930 Tracheostomy status: Secondary | ICD-10-CM

## 2017-10-01 LAB — CBC
HEMATOCRIT: 31.2 % — AB (ref 39.0–52.0)
Hemoglobin: 9.9 g/dL — ABNORMAL LOW (ref 13.0–17.0)
MCH: 30.7 pg (ref 26.0–34.0)
MCHC: 31.7 g/dL (ref 30.0–36.0)
MCV: 96.9 fL (ref 78.0–100.0)
Platelets: 257 10*3/uL (ref 150–400)
RBC: 3.22 MIL/uL — ABNORMAL LOW (ref 4.22–5.81)
RDW: 14.9 % (ref 11.5–15.5)
WBC: 21.6 10*3/uL — ABNORMAL HIGH (ref 4.0–10.5)

## 2017-10-01 LAB — BASIC METABOLIC PANEL
Anion gap: 10 (ref 5–15)
Anion gap: 7 (ref 5–15)
BUN: 68 mg/dL — ABNORMAL HIGH (ref 6–20)
BUN: 66 mg/dL — ABNORMAL HIGH (ref 6–20)
CALCIUM: 8.8 mg/dL — AB (ref 8.9–10.3)
CALCIUM: 8.6 mg/dL — AB (ref 8.9–10.3)
CHLORIDE: 114 mmol/L — AB (ref 101–111)
CO2: 24 mmol/L (ref 22–32)
CO2: 26 mmol/L (ref 22–32)
CREATININE: 2.01 mg/dL — AB (ref 0.61–1.24)
CREATININE: 1.92 mg/dL — AB (ref 0.61–1.24)
Chloride: 113 mmol/L — ABNORMAL HIGH (ref 101–111)
GFR calc Af Amer: 39 mL/min — ABNORMAL LOW (ref >60)
GFR calc non Af Amer: 34 mL/min — ABNORMAL LOW (ref >60)
GFR calc non Af Amer: 36 mL/min — ABNORMAL LOW (ref >60)
GFR, EST AFRICAN AMERICAN: 41 mL/min — AB (ref >60)
GLUCOSE: 113 mg/dL — AB (ref 65–99)
GLUCOSE: 139 mg/dL — AB (ref 65–99)
Potassium: 3.7 mmol/L (ref 3.5–5.1)
Potassium: 3.8 mmol/L (ref 3.5–5.1)
Sodium: 147 mmol/L — ABNORMAL HIGH (ref 135–145)
Sodium: 147 mmol/L — ABNORMAL HIGH (ref 135–145)

## 2017-10-01 LAB — GLUCOSE, CAPILLARY
GLUCOSE-CAPILLARY: 117 mg/dL — AB (ref 65–99)
GLUCOSE-CAPILLARY: 140 mg/dL — AB (ref 65–99)
Glucose-Capillary: 123 mg/dL — ABNORMAL HIGH (ref 65–99)
Glucose-Capillary: 134 mg/dL — ABNORMAL HIGH (ref 65–99)

## 2017-10-01 LAB — HEPARIN LEVEL (UNFRACTIONATED)
HEPARIN UNFRACTIONATED: 0.18 [IU]/mL — AB (ref 0.30–0.70)
Heparin Unfractionated: 0.23 IU/mL — ABNORMAL LOW (ref 0.30–0.70)
Heparin Unfractionated: 0.27 IU/mL — ABNORMAL LOW (ref 0.30–0.70)

## 2017-10-01 LAB — PHOSPHORUS: Phosphorus: 3.2 mg/dL (ref 2.5–4.6)

## 2017-10-01 LAB — MAGNESIUM: Magnesium: 2 mg/dL (ref 1.7–2.4)

## 2017-10-01 MED ORDER — ALBUTEROL SULFATE (2.5 MG/3ML) 0.083% IN NEBU
2.5000 mg | INHALATION_SOLUTION | Freq: Four times a day (QID) | RESPIRATORY_TRACT | Status: AC
  Administered 2017-10-02 – 2017-10-04 (×12): 2.5 mg via RESPIRATORY_TRACT
  Filled 2017-10-01 (×12): qty 3

## 2017-10-01 MED ORDER — BISACODYL 10 MG RE SUPP: 10.0000 mg | Freq: Every day | RECTAL | Status: DC | PRN

## 2017-10-01 MED ORDER — METOPROLOL TARTRATE 5 MG/5ML IV SOLN
2.5000 mg | Freq: Once | INTRAVENOUS | Status: AC
  Administered 2017-10-01: 2.5 mg via INTRAVENOUS

## 2017-10-01 MED ORDER — METOPROLOL TARTRATE 5 MG/5ML IV SOLN
INTRAVENOUS | Status: AC
  Administered 2017-10-01: 2.5 mg via INTRAVENOUS
  Filled 2017-10-01: qty 5

## 2017-10-01 MED ORDER — POLYETHYLENE GLYCOL 3350 17 G PO PACK
17.0000 g | PACK | Freq: Every day | ORAL | Status: DC
  Administered 2017-10-01 – 2017-10-04 (×4): 17 g via ORAL
  Filled 2017-10-01 (×5): qty 1

## 2017-10-01 MED ORDER — POTASSIUM CHLORIDE 20 MEQ/15ML (10%) PO SOLN
20.0000 meq | Freq: Once | ORAL | Status: AC
  Administered 2017-10-01: 20 meq via ORAL
  Filled 2017-10-01: qty 15

## 2017-10-01 MED ORDER — HEPARIN (PORCINE) IN NACL 100-0.45 UNIT/ML-% IJ SOLN
2950.0000 [IU]/h | INTRAMUSCULAR | Status: DC
  Administered 2017-10-01: 2550 [IU]/h via INTRAVENOUS
  Administered 2017-10-02: 2950 [IU]/h via INTRAVENOUS
  Filled 2017-10-01 (×9): qty 250

## 2017-10-01 MED ORDER — METOPROLOL TARTRATE 5 MG/5ML IV SOLN
5.0000 mg | Freq: Four times a day (QID) | INTRAVENOUS | Status: DC | PRN
  Administered 2017-10-02 (×2): 5 mg via INTRAVENOUS
  Filled 2017-10-01 (×2): qty 5

## 2017-10-01 MED ORDER — ACETYLCYSTEINE 20 % IN SOLN
2.0000 mL | Freq: Four times a day (QID) | RESPIRATORY_TRACT | Status: DC
  Administered 2017-10-02 – 2017-10-03 (×7): 2 mL via RESPIRATORY_TRACT
  Filled 2017-10-01 (×8): qty 4

## 2017-10-01 MED ORDER — ACETYLCYSTEINE 20 % IN SOLN
2.0000 mL | Freq: Four times a day (QID) | RESPIRATORY_TRACT | Status: DC
  Filled 2017-10-01 (×2): qty 4

## 2017-10-01 MED ORDER — POTASSIUM CHLORIDE 20 MEQ/15ML (10%) PO SOLN
40.0000 meq | Freq: Once | ORAL | Status: DC
  Administered 2017-10-01: 40 meq via ORAL

## 2017-10-01 MED ORDER — FUROSEMIDE 10 MG/ML IJ SOLN
40.0000 mg | Freq: Once | INTRAMUSCULAR | Status: AC
  Administered 2017-10-01: 40 mg via INTRAVENOUS
  Filled 2017-10-01: qty 4

## 2017-10-01 NOTE — Progress Notes (Signed)
Pharmacy Antibiotic Note  AUGUSTA PROPSON is a 61 y.o. male admitted on 09/20/2017 with bacteremia  Plan: Continue vanc 500 q24h - likely no more tr needed unless renal fx changes significantly  Continue metronidazole 500 mg q8h  Height: 6' (182.9 cm) Weight: 213 lb 6.5 oz (96.8 kg) IBW/kg (Calculated) : 77.6  Temp (24hrs), Avg:100.2 F (37.9 C), Min:98.4 F (36.9 C), Max:102.2 F (39 C)  Recent Labs  Lab 09/26/17 0421 09/27/17 0213 09/28/17 0317 09/29/17 0430 09/29/17 1031 09/30/17 0423 10/01/17 0500  WBC 12.2* 8.7 12.6* 13.9*  --  12.7* 21.6*  CREATININE 1.93* 1.99* 2.02* 1.84*  --  1.98* 2.01*  LATICACIDVEN  --   --   --   --  2.0*  --   --   VANCOTROUGH 21*  --   --  21*  --   --   --     Estimated Creatinine Clearance: 46 mL/min (A) (by C-G formula based on SCr of 2.01 mg/dL (H)).    Allergies  Allergen Reactions  . Codeine Anaphylaxis and Nausea And Vomiting  . Lisinopril Itching and Rash  . Hydrocodone Other (See Comments)    On MAR  . Hydrocodone-Acetaminophen Nausea And Vomiting    Cannot take any acetaminophen due to liver issues per sister  . Tegaderm Ag Mesh [Silver] Other (See Comments)    Allergy to "silver compounds" listed on MAR  . Tylenol [Acetaminophen] Nausea Only and Other (See Comments)    Cannot take any acetaminophen due to liver issues per sister   Isaac Bliss, PharmD, BCPS, BCCCP Clinical Pharmacist Clinical phone for 10/01/2017 from 7a-3:30p: [phone] If after 3:30p, please call main pharmacy at: [phone] 10/01/2017 11:51 AM

## 2017-10-01 NOTE — Progress Notes (Signed)
PCCM Interval Note.   Patient with hypoxia and thick copious secretions and Sinus Tachycardia.   PRN Metoprolol 5 mg q6h for HR >120.  Mucomyst/Albuterol q6h for 72 hours with Chest PT   Jovita Kussmaul, AGACNP-BC [facility]  Pgr: [phone]  PCCM Pgr: [phone]

## 2017-10-01 NOTE — Progress Notes (Signed)
PULMONARY / CRITICAL CARE MEDICINE   Name: Curtis Keller MRN: [id number] DOB: [birth year]    ADMISSION DATE:  09/20/2017 CONSULTATION DATE:  10/20/2017  REFERRING MD:  ED  CHIEF COMPLAINT:  Shortness of breath  Brief:   61 yr old M, SNF resident,  with PMH of COPD, CHF EF 20%, CAD, apical mural thrombus   Admitted 11/28 with reports of altered mental status for 2 days. Found to have MRSA PNA. Intubated x 3 and ultimately trached on 12/7.   SUBJECTIVE:   Tmax 102.2 overnight. Attempted trach collar this morning. Only lasted for few minutes due to hypoxia.   VITAL SIGNS: BP (!) 136/93   Pulse (!) 122   Temp 98.6 F (37 C) (Rectal)   Resp 20   Ht 6' (1.829 m)   Wt 96.8 kg (213 lb 6.5 oz)   SpO2 91%   BMI 28.94 kg/m   HEMODYNAMICS: CVP:  [6 mmHg-8 mmHg] 8 mmHg  VENTILATOR SETTINGS: Vent Mode: PRVC FiO2 (%):  [45 %-50 %] 50 % Set Rate:  [18 bmp] 18 bmp Vt Set:  [620 mL] 620 mL PEEP:  [5 cmH20] 5 cmH20 Pressure Support:  [10 cmH20] 10 cmH20 Plateau Pressure:  [12 cmH20-19 cmH20] 19 cmH20  INTAKE / OUTPUT: I/O last 3 completed shifts: In: 5112.1 [I.V.:1482.1; Other:60; NG/GT:3070; IV Piggyback:500] Out: 5205 [Urine:5205]  PHYSICAL EXAMINATION: General: Chronically ill elderly male, on vent  HEENT: MMM, Trach > clean, dry, intact  Neuro: Awake, alert, follows commands  CV: RRR, no MRG  PULM: Coarse breath sounds, crackles to bases, no wheeze GI: Soft, non-tender, active bowel sounds  Extremities: -Edema Skin: Warm, dry, decub on sacral   LABS:  BMET Recent Labs  Lab 09/29/17 0430 09/30/17 0423 10/01/17 0500  NA 145 147* 147*  K 3.2* 4.0 3.7  CL 114* 116* 113*  CO2 25 24 24   BUN 77* 76* 68*  CREATININE 1.84* 1.98* 2.01*  GLUCOSE 93 140* 113*   Electrolytes Recent Labs  Lab 09/27/17 0213  09/29/17 0430 09/30/17 0423 10/01/17 0500  CALCIUM 8.6*   < > 8.7* 8.9 8.8*  MG 2.2  --  2.1  --  2.0  PHOS 4.6  --  3.3  --  3.2   < > = values in this  interval not displayed.   CBC Recent Labs  Lab 09/29/17 0430 09/30/17 0423 10/01/17 0500  WBC 13.9* 12.7* 21.6*  HGB 9.9* 10.3* 9.9*  HCT 31.2* 32.7* 31.2*  PLT 296 261 257   Coag's No results for input(s): APTT, INR in the last 168 hours.  Sepsis Markers Recent Labs  Lab 09/29/17 1031  LATICACIDVEN 2.0*   ABG Recent Labs  Lab 09/26/17 0028 09/29/17 0402 09/29/17 0952  PHART 7.359 7.428 7.289*  PCO2ART 48.1* 37.2 47.5  PO2ART 215* 60.6* 136.0*   Liver Enzymes Recent Labs  Lab 09/27/17 0213  ALBUMIN 2.0*   Cardiac Enzymes Recent Labs  Lab 09/26/17 2325 09/27/17 0213 09/27/17 1336  TROPONINI 0.48* 0.46* 0.29*   Glucose Recent Labs  Lab 09/30/17 1245 09/30/17 1625 09/30/17 2038 09/30/17 2336 10/01/17 0448 10/01/17 1115  GLUCAP 122* 128* 145* 130* 117* 140*   Imaging No results found.  CULTURES Sputum culture 11/28 > MRSA BCx2 11/28 > MRSA, C perfringens BCx2 11/30 > negative  ANTIBIOTICS: Zosyn 11/28 > 11/29 Vanc 11/28 >> Flagyl 11/30 >>   EVENTS 11/28  Admit 12/03  Extubated  12/04  Re-intubated (suspect medication related) 12/05  TEE   STUDIES  TEE 12/6 >> diffuse hypokinesis, LVEF 20-25%, may be a small calcified LV thrombus at the apex, no vegetation  LINES RUE PICC 12/4 >> ETT 12/2 > 12/3 ETT 12/4 > 12/6 ETT 12/7 >12/7 Trach 12/7>>  ASSESSMENT / PLAN: 61 year old SNF resident with multiple medical issues admitted with MRSA and Clostridium bacteremia, & MRSA HCAP.  Acute hypoxemic respiratory failure in the setting of diffuse pulmonary infiltrates  MRSA pneumonia with ALI  Difficult Intubation  Left Pleural Effusion  Pulmonary Edema  P: Titrate O2 for sat of 88-92% PSV as tolerated  Trach care per protocol  Lasix 40 meq x 1  ABX as outlined per ID, stop dates in place for 12/14  Follow intermittent CXR > Repeat in AM   Septic Shock - volume responsive w/ Clostridium Perfringens and MRSA bacteremia  Systolic HFrEF  (60%20%). Repeat echo shows unchaged EF Elevated troponin, suspect demand ischemia he is not a candidate for intervention; this has trended down  P: Cardiology/Heart Following Following  Cardiac monitoring  KVO IVF  History of left ventricular thrombus, on chronic warfarin therapy Anemia  P: Heparin GTT per pharmacy Convert back to coumadin once off flagyl  Trend CBC    Acute on chronic kidney disease  Mild hyperchloremia  P: Free water to 250 ml water q6 Trend BMP/urinary output Replace electrolytes as indicated Avoid nephrotoxic agents, ensure adequate renal perfusion  Metabolic encephalopathy; with history of stroke and left-sided hemiplegia since June 2018.  CT head neg. P: PT efforts  Hold home zanaflex > if restarted would reduce to lowest dose (oversedate with restart)  Valium 2mg  QD (home med)  PRN fentanyl and versed     Constipation  Hx of dysphasia; has chronic PEG, tolerates soft diet at SNF Hx of Hep C P: Resume TF TF per Nutrition  Add Miralax daily  PEG care per protocol  Pepcid for SUP   DM P: CBG with SSI   Sacral Decubitus - moisture associated  P: Frequent turning  WOC following  Space boots for heel protection  Foam dressing to sacrum, heels  Hx Mechanical Falls with Prior Hip Fracture P: Monitor / fall precautions  PT efforts as able   FAMILY  - Updates:  Patient updated on plan of care 12/9.  - Global:  ICU disposition.    CC Time: 32 minutes   Jovita KussmaulKatalina Eubanks, AGACNP-BC [place] [facility]  Pgr: [phone]  PCCM Pgr: [phone]  Attending Note:  61 year old male with multiple episode of respiratory failure related to pulmonary edema from heart failure.  On exam, coarse BS diffusely.  I reviewed CXR myself, trach is in good position and pulmonary edema noted.  Will give a dose of lasix.  Strict I/o.  Failed TC trials today, will attempt high PS trials.  Will need a PEG early next week to facilitate with  placement.  I would not decannulate at this point as this is trach number 3.  Continue PT efforts.  The patient is critically ill with multiple organ systems failure and requires high complexity decision making for assessment and support, frequent evaluation and titration of therapies, application of advanced monitoring technologies and extensive interpretation of multiple databases.   Critical Care Time devoted to patient care services described in this note is  35  Minutes. This time reflects time of care of this signee Dr Koren BoundWesam Yacoub. This critical care time does not reflect procedure time, or teaching time or supervisory time of PA/NP/Med student/Med Resident etc  but could involve care discussion time.  Rush Farmer, M.D. [facility] Pulmonary/Critical Care Medicine. Pager: [phone]. After hours pager: [phone].

## 2017-10-01 NOTE — Progress Notes (Signed)
Progress Note  Patient Name: Curtis Keller Date of Encounter: 10/01/2017  Primary Cardiologist: Dr. Aundra Dubin  Subjective   Patient status post tracheostomy appears comfortable this morning, no increased work of breathing  Inpatient Medications    Scheduled Meds: . atorvastatin  40 mg Per Tube QHS  . chlorhexidine gluconate (MEDLINE KIT)  15 mL Mouth Rinse BID  . Chlorhexidine Gluconate Cloth  6 each Topical Daily  . diazepam  2 mg Oral Daily  . famotidine  20 mg Per Tube BID  . feeding supplement (PRO-STAT SUGAR FREE 64)  30 mL Per Tube Daily  . free water  250 mL Per Tube Q6H  . insulin aspart  0-15 Units Subcutaneous Q4H  . mouth rinse  15 mL Mouth Rinse QID  . multivitamin  15 mL Per Tube Daily  . senna-docusate  1 tablet Oral Daily  . sodium chloride flush  10-40 mL Intracatheter Q12H   Continuous Infusions: . sodium chloride 250 mL (10/01/17 0700)  . feeding supplement (VITAL AF 1.2 CAL) 1,000 mL (10/01/17 0208)  . heparin 2,550 Units/hr (10/01/17 0816)  . metronidazole Stopped (10/01/17 0510)  . norepinephrine (LEVOPHED) Adult infusion Stopped (09/27/17 2122)  . propofol Stopped (09/30/17 0002)  . vancomycin Stopped (10/01/17 0921)   PRN Meds: sodium chloride, fentaNYL (SUBLIMAZE) injection, midazolam, sodium chloride flush   Vital Signs    Vitals:   10/01/17 0451 10/01/17 0500 10/01/17 0600 10/01/17 0805  BP:  (!) 141/93 (!) 136/93   Pulse:  (!) 119 (!) 122   Resp:  18 20   Temp: 98.4 F (36.9 C) 98.6 F (37 C)    TempSrc: Oral Rectal    SpO2:  90% 90% 91%  Weight:  213 lb 6.5 oz (96.8 kg)    Height:        Intake/Output Summary (Last 24 hours) at 10/01/2017 1029 Last data filed at 10/01/2017 1000 Gross per 24 hour  Intake 3963.83 ml  Output 3885 ml  Net 78.83 ml   Filed Weights   09/29/17 0413 09/30/17 0454 10/01/17 0500  Weight: 219 lb 2.2 oz (99.4 kg) 210 lb 15.7 oz (95.7 kg) 213 lb 6.5 oz (96.8 kg)    Telemetry    Probable sinus tach  with frequent and consecutive PVCs- Personally Reviewed  ECG    None- Personally Reviewed  Physical Exam   GEN:  Chronically ill-appearing but no acute distress Neck:  7 cm JVD Cardiac:  Regular tachycardia, no murmurs, rubs, or gallops.  Heart sounds distant Respiratory:  Scattered rales and rhonchi throughout GI: Soft, nontender, non-distended  MS: No edema; No deformity. Neuro:   Somnolent  Labs    Chemistry Recent Labs  Lab 09/27/17 0213  09/29/17 0430 09/30/17 0423 10/01/17 0500  NA 142   < > 145 147* 147*  K 4.0   < > 3.2* 4.0 3.7  CL 108   < > 114* 116* 113*  CO2 26   < > 25 24 24   GLUCOSE 107*   < > 93 140* 113*  BUN 79*   < > 77* 76* 68*  CREATININE 1.99*   < > 1.84* 1.98* 2.01*  CALCIUM 8.6*   < > 8.7* 8.9 8.8*  ALBUMIN 2.0*  --   --   --   --   GFRNONAA 34*   < > 38* 34* 34*  GFRAA 40*   < > 44* 40* 39*  ANIONGAP 8   < > 6 7 10    < > =  values in this interval not displayed.     Hematology Recent Labs  Lab 09/29/17 0430 09/30/17 0423 10/01/17 0500  WBC 13.9* 12.7* 21.6*  RBC 3.20* 3.33* 3.22*  HGB 9.9* 10.3* 9.9*  HCT 31.2* 32.7* 31.2*  MCV 97.5 98.2 96.9  MCH 30.9 30.9 30.7  MCHC 31.7 31.5 31.7  RDW 15.0 15.2 14.9  PLT 296 261 257    Cardiac Enzymes Recent Labs  Lab 09/26/17 2325 09/27/17 0213 09/27/17 1336  TROPONINI 0.48* 0.46* 0.29*   No results for input(s): TROPIPOC in the last 168 hours.   BNPNo results for input(s): BNP, PROBNP in the last 168 hours.   DDimer No results for input(s): DDIMER in the last 168 hours.   Radiology    Dg Chest Port 1 View  Result Date: 09/30/2017 CLINICAL DATA:  61 year old male with acute respiratory failure and hypoxia EXAM: PORTABLE CHEST 1 VIEW COMPARISON:  Prior chest x-ray 09/29/2017 FINDINGS: Interval tracheostomy. The tip of the tracheostomy tube is midline and at the level of the clavicles. Stable position of right upper extremity approach PICC. Catheter tip overlies the cavoatrial  junction. Similar degree of cardiomegaly. Persistent pulmonary vascular congestion with diffuse bilateral interstitial and airspace opacities. Bibasilar opacities are also present in remain consistent with layering effusions and atelectasis. No evidence of acute osseous abnormality. IMPRESSION: 1. Interval tracheostomy. The tracheostomy tube he is in good position. 2. Stable cardiomegaly, pulmonary edema and bilateral layering pleural effusions and associated bibasilar atelectasis. Electronically Signed   By: Jacqulynn Cadet M.D.   On: 09/30/2017 09:10    Cardiac Studies   None  Patient Profile     61 y.o. male looking older than stated age, admitted with respiratory failure, now with trach tube in place   Assessment & Plan    1.  Shock likely septic, with MRSA pneumonia, continue antibiotic therapy and supportive care 2.  Acute on chronic systolic heart failure -he has known severe left ventricular dysfunction.  He appears to be relatively euvolemic.  We will follow central venous pressures as able. 3.  PVCs and nonsustained VT -he has had no sustained arrhythmias.  Once his blood pressure is more stable, could consider low-dose beta-blocker therapy. 4.  Coronary artery disease-no obvious evidence of ongoing ischemia.  His troponin was elevated, almost certainly due to demand from sepsis.  Overall prognosis is guarded.  For questions or updates, please contact [place] Please consult [email or web address] for contact info under Cardiology/STEMI.      Signed, Cristopher Peru, MD  10/01/2017, 10:29 AM  Patient ID: Curtis Keller, male   DOB: [birth year], 61 y.o.   MRN: [id number]

## 2017-10-01 NOTE — Progress Notes (Signed)
ANTICOAGULATION CONSULT NOTE - Follow Up Consult  Pharmacy Consult:  Heparin Indication:  History of atrial thrombus  Patient Measurements: Height: 6' (182.9 cm) Weight: 213 lb 6.5 oz (96.8 kg) IBW/kg (Calculated) : 77.6 Heparin Dosing Weight:  97 kg  Vital Signs: Temp: 98.6 F (37 C) (12/09 0500) Temp Source: Rectal (12/09 0500) BP: 136/93 (12/09 0600) Pulse Rate: 122 (12/09 0600)  Labs: Recent Labs    09/29/17 0430  09/30/17 0423 09/30/17 1303 09/30/17 1441 10/01/17 0500  HGB 9.9*  --  10.3*  --   --  9.9*  HCT 31.2*  --  32.7*  --   --  31.2*  PLT 296  --  261  --   --  257  HEPARINUNFRC  --    < > 0.28* >2.20* 0.44 0.23*  CREATININE 1.84*  --  1.98*  --   --  2.01*   < > = values in this interval not displayed.    Estimated Creatinine Clearance: 46 mL/min (A) (by C-G formula based on SCr of 2.01 mg/dL (H)).  Assessment: 34 YOM with history of atrial thrombus to continue on IV heparin while Coumadin is on hold.   Heparin level now slightly below goal on 2450 units/hr at 0.23.  Goal of Therapy:  Heparin level 0.3-0.7 units/ml Monitor platelets by anticoagulation protocol: Yes  Plan:  Increase IV heparin to 2550 units/hr. Recheck heparin level in 6 hrs. Daily HL, CBC  Tad Moore, BCPS  Clinical Pharmacist Pager [phone]  10/01/2017 7:32 AM

## 2017-10-01 NOTE — Progress Notes (Signed)
ANTICOAGULATION CONSULT NOTE - Follow Up Consult  Pharmacy Consult:  Heparin Indication:  History of atrial thrombus  Patient Measurements: Height: 6' (182.9 cm) Weight: 213 lb 6.5 oz (96.8 kg) IBW/kg (Calculated) : 77.6 Heparin Dosing Weight:  97 kg  Vital Signs: Temp: 98.9 F (37.2 C) (12/09 2024) Temp Source: Oral (12/09 2024) BP: 120/88 (12/09 2300) Pulse Rate: 122 (12/09 2300)  Labs: Recent Labs    09/29/17 0430  09/30/17 0423  10/01/17 0500 10/01/17 1403 10/01/17 1745 10/01/17 2208  HGB 9.9*  --  10.3*  --  9.9*  --   --   --   HCT 31.2*  --  32.7*  --  31.2*  --   --   --   PLT 296  --  261  --  257  --   --   --   HEPARINUNFRC  --    < > 0.28*   < > 0.23* 0.18*  --  0.27*  CREATININE 1.84*  --  1.98*  --  2.01*  --  1.92*  --    < > = values in this interval not displayed.    Estimated Creatinine Clearance: 48.1 mL/min (A) (by C-G formula based on SCr of 1.92 mg/dL (H)).  Assessment: 51 YOM with history of atrial thrombus to continue on IV heparin while Coumadin is on hold.   Heparin level sub-therapeutic tonight   Goal of Therapy:  Heparin level 0.3-0.7 units/ml Monitor platelets by anticoagulation protocol: Yes    Plan:  Inc heparin to 2750 units/hr AM HL  Abran Duke, PharmD, BCPS Clinical Pharmacist Phone: [phone]

## 2017-10-01 NOTE — Progress Notes (Signed)
ANTICOAGULATION CONSULT NOTE - Follow Up Consult  Pharmacy Consult:  Heparin Indication:  History of atrial thrombus  Patient Measurements: Height: 6' (182.9 cm) Weight: 213 lb 6.5 oz (96.8 kg) IBW/kg (Calculated) : 77.6 Heparin Dosing Weight:  97 kg  Vital Signs: Temp: 99.7 F (37.6 C) (12/09 1156) Temp Source: Oral (12/09 1156) BP: 119/96 (12/09 1213) Pulse Rate: 117 (12/09 1213)  Labs: Recent Labs    09/29/17 0430  09/30/17 0423  09/30/17 1441 10/01/17 0500 10/01/17 1403  HGB 9.9*  --  10.3*  --   --  9.9*  --   HCT 31.2*  --  32.7*  --   --  31.2*  --   PLT 296  --  261  --   --  257  --   HEPARINUNFRC  --    < > 0.28*   < > 0.44 0.23* 0.18*  CREATININE 1.84*  --  1.98*  --   --  2.01*  --    < > = values in this interval not displayed.    Estimated Creatinine Clearance: 46 mL/min (A) (by C-G formula based on SCr of 2.01 mg/dL (H)).  Assessment: 28 YOM with history of atrial thrombus to continue on IV heparin while Coumadin is on hold.   HL low 0.18 at 2450 units/hr  Goal of Therapy:  Heparin level 0.3-0.7 units/ml Monitor platelets by anticoagulation protocol: Yes  Plan:  Continue heparin 2600 units/hr 2200 HL Daily HL CBC  Isaac Bliss, PharmD, BCPS, BCCCP Clinical Pharmacist Clinical phone for 10/01/2017 from 7a-3:30p: [phone] If after 3:30p, please call main pharmacy at: [phone] 10/01/2017 3:14 PM

## 2017-10-02 ENCOUNTER — Inpatient Hospital Stay (HOSPITAL_COMMUNITY): Payer: Medicare Other

## 2017-10-02 LAB — BASIC METABOLIC PANEL
ANION GAP: 7 (ref 5–15)
BUN: 65 mg/dL — AB (ref 6–20)
CALCIUM: 8.7 mg/dL — AB (ref 8.9–10.3)
CO2: 28 mmol/L (ref 22–32)
Chloride: 113 mmol/L — ABNORMAL HIGH (ref 101–111)
Creatinine, Ser: 1.82 mg/dL — ABNORMAL HIGH (ref 0.61–1.24)
GFR calc Af Amer: 44 mL/min — ABNORMAL LOW (ref >60)
GFR calc non Af Amer: 38 mL/min — ABNORMAL LOW (ref >60)
GLUCOSE: 118 mg/dL — AB (ref 65–99)
Potassium: 3.5 mmol/L (ref 3.5–5.1)
Sodium: 148 mmol/L — ABNORMAL HIGH (ref 135–145)

## 2017-10-02 LAB — CBC
HEMATOCRIT: 31.5 % — AB (ref 39.0–52.0)
HEMOGLOBIN: 10 g/dL — AB (ref 13.0–17.0)
MCH: 30.9 pg (ref 26.0–34.0)
MCHC: 31.7 g/dL (ref 30.0–36.0)
MCV: 97.2 fL (ref 78.0–100.0)
Platelets: 280 10*3/uL (ref 150–400)
RBC: 3.24 MIL/uL — ABNORMAL LOW (ref 4.22–5.81)
RDW: 15.2 % (ref 11.5–15.5)
WBC: 16.6 10*3/uL — ABNORMAL HIGH (ref 4.0–10.5)

## 2017-10-02 LAB — GLUCOSE, CAPILLARY
GLUCOSE-CAPILLARY: 132 mg/dL — AB (ref 65–99)
GLUCOSE-CAPILLARY: 125 mg/dL — AB (ref 65–99)
GLUCOSE-CAPILLARY: 111 mg/dL — AB (ref 65–99)
Glucose-Capillary: 104 mg/dL — ABNORMAL HIGH (ref 65–99)
Glucose-Capillary: 107 mg/dL — ABNORMAL HIGH (ref 65–99)
Glucose-Capillary: 118 mg/dL — ABNORMAL HIGH (ref 65–99)
Glucose-Capillary: 119 mg/dL — ABNORMAL HIGH (ref 65–99)

## 2017-10-02 LAB — HEPARIN LEVEL (UNFRACTIONATED)
HEPARIN UNFRACTIONATED: 0.23 [IU]/mL — AB (ref 0.30–0.70)
HEPARIN UNFRACTIONATED: 0.17 [IU]/mL — AB (ref 0.30–0.70)

## 2017-10-02 LAB — MAGNESIUM: Magnesium: 1.9 mg/dL (ref 1.7–2.4)

## 2017-10-02 LAB — PHOSPHORUS: PHOSPHORUS: 3.4 mg/dL (ref 2.5–4.6)

## 2017-10-02 MED ORDER — POTASSIUM CHLORIDE 10 MEQ/50ML IV SOLN
10.0000 meq | INTRAVENOUS | Status: DC
  Filled 2017-10-02 (×2): qty 50

## 2017-10-02 MED ORDER — HEPARIN BOLUS VIA INFUSION
2000.0000 [IU] | Freq: Once | INTRAVENOUS | Status: AC
  Administered 2017-10-02: 2000 [IU] via INTRAVENOUS
  Filled 2017-10-02: qty 2000

## 2017-10-02 MED ORDER — CARVEDILOL 3.125 MG PO TABS
3.1250 mg | ORAL_TABLET | Freq: Two times a day (BID) | ORAL | Status: DC
  Administered 2017-10-02 – 2017-10-03 (×3): 3.125 mg
  Filled 2017-10-02 (×3): qty 1

## 2017-10-02 MED ORDER — HEPARIN (PORCINE) IN NACL 100-0.45 UNIT/ML-% IJ SOLN
3150.0000 [IU]/h | INTRAMUSCULAR | Status: DC
  Administered 2017-10-02 – 2017-10-03 (×2): 3250 [IU]/h via INTRAVENOUS
  Filled 2017-10-02 (×5): qty 250

## 2017-10-02 MED ORDER — POTASSIUM CHLORIDE 20 MEQ/15ML (10%) PO SOLN
40.0000 meq | Freq: Once | ORAL | Status: AC
  Administered 2017-10-02: 40 meq
  Filled 2017-10-02: qty 30

## 2017-10-02 MED ORDER — FAMOTIDINE 40 MG/5ML PO SUSR
20.0000 mg | Freq: Every day | ORAL | Status: DC
  Administered 2017-10-03 – 2017-10-10 (×8): 20 mg
  Filled 2017-10-02 (×8): qty 2.5

## 2017-10-02 MED ORDER — POTASSIUM CHLORIDE 10 MEQ/50ML IV SOLN: 10.0000 meq | INTRAVENOUS | Status: DC

## 2017-10-02 MED ORDER — FUROSEMIDE 10 MG/ML IJ SOLN
40.0000 mg | Freq: Once | INTRAMUSCULAR | Status: AC
  Administered 2017-10-02: 40 mg via INTRAVENOUS
  Filled 2017-10-02: qty 4

## 2017-10-02 NOTE — Progress Notes (Signed)
Early am : Pt did not tolerate weaning from Vent. Still has # 8 Shiley trach and tries to talk around trach - encouraged pt to not to try to talk while trached on vent. . Alert and will follow commands. Pulled up frequently and turned q 2 hrs today. Agitated at times and swung at Nurse and lab tech with lab draw. Explained all care to pt and reoriented to day time and date. Hd Lasix and potassium replacement continues on heparin gtt as ordered per Pharmacy. Received Fentanyl x 3 today for CPOT eval of 3 to 4 with good results.

## 2017-10-02 NOTE — Progress Notes (Signed)
eLink Physician-Brief Progress Note Patient Name: Curtis Keller DOB: [birth year] MRN: [id number]   Date of Service  10/02/2017  HPI/Events of Note  K+ = 3.5 and Creatinine = 1.82.  eICU Interventions  Will cautiously replace K+.      Intervention Category Major Interventions: Electrolyte abnormality - evaluation and management  Kelee Cunningham Eugene 10/02/2017, 6:30 AM

## 2017-10-02 NOTE — Progress Notes (Signed)
PULMONARY / CRITICAL CARE MEDICINE   Name: Curtis Keller MRN: [id number] DOB: 1955-11-19    ADMISSION DATE:  09/20/2017 CONSULTATION DATE:  10/20/2017  REFERRING MD:  ED  CHIEF COMPLAINT:  Shortness of breath  Brief:   61 yr old M, SNF resident, with PMH of COPD, CHF EF 20%, CAD, apical mural thrombus   Admitted 11/28 with reports of altered mental status for 2 days. Found to have MRSA PNA. Intubated x 3 and ultimately trached on 12/7.   SUBJECTIVE:   Fevers improved. Failed wean quickly today. Talking around trach but getting volumes fine.   VITAL SIGNS: BP 102/69   Pulse (!) 122   Temp 98.5 F (36.9 C) (Oral)   Resp 16   Ht 6' (1.829 m)   Wt 91.8 kg (202 lb 6.1 oz)   SpO2 91%   BMI 27.45 kg/m   HEMODYNAMICS: CVP:  [9 mmHg-10 mmHg] 10 mmHg  VENTILATOR SETTINGS: Vent Mode: PRVC FiO2 (%):  [50 %] 50 % Set Rate:  [16 bmp-18 bmp] 16 bmp Vt Set:  [620 mL] 620 mL PEEP:  [5 cmH20] 5 cmH20 Plateau Pressure:  [11 cmH20-20 cmH20] 15 cmH20  INTAKE / OUTPUT: I/O last 3 completed shifts: In: 3887.4 [I.V.:1317.4; NG/GT:2170; IV Piggyback:400] Out: 4725 [Urine:4725]  PHYSICAL EXAMINATION: General: Chronically ill elderly male, on vent  HEENT: [place]/AT, PERRL, no JVD Neuro: Awake, alert, follows commands. Intermittent agitation when not directed. CV: Tachy, regular, no MRG  PULM: Coarse bilateral breath sounds GI: Soft, non-tender, active bowel sounds  Extremities: No acute deformity, no edema.  Skin: Warm, dry, decub on sacral   LABS:  BMET Recent Labs  Lab 10/01/17 0500 10/01/17 1745 10/02/17 0426  NA 147* 147* 148*  K 3.7 3.8 3.5  CL 113* 114* 113*  CO2 24 26 28   BUN 68* 66* 65*  CREATININE 2.01* 1.92* 1.82*  GLUCOSE 113* 139* 118*   Electrolytes Recent Labs  Lab 09/29/17 0430  10/01/17 0500 10/01/17 1745 10/02/17 0426  CALCIUM 8.7*   < > 8.8* 8.6* 8.7*  MG 2.1  --  2.0  --  1.9  PHOS 3.3  --  3.2  --  3.4   < > = values in this interval not  displayed.   CBC Recent Labs  Lab 09/30/17 0423 10/01/17 0500 10/02/17 0426  WBC 12.7* 21.6* 16.6*  HGB 10.3* 9.9* 10.0*  HCT 32.7* 31.2* 31.5*  PLT 261 257 280   Coag's No results for input(s): APTT, INR in the last 168 hours.  Sepsis Markers Recent Labs  Lab 09/29/17 1031  LATICACIDVEN 2.0*   ABG Recent Labs  Lab 09/26/17 0028 09/29/17 0402 09/29/17 0952  PHART 7.359 7.428 7.289*  PCO2ART 48.1* 37.2 47.5  PO2ART 215* 60.6* 136.0*   Liver Enzymes Recent Labs  Lab 09/27/17 0213  ALBUMIN 2.0*   Cardiac Enzymes Recent Labs  Lab 09/26/17 2325 09/27/17 0213 09/27/17 1336  TROPONINI 0.48* 0.46* 0.29*   Glucose Recent Labs  Lab 10/01/17 1115 10/01/17 1538 10/01/17 2022 10/02/17 0014 10/02/17 0400 10/02/17 1025  GLUCAP 140* 123* 134* 104* 118* 125*   Imaging Dg Chest Port 1 View  Result Date: 10/02/2017 CLINICAL DATA:  61 year old male with a history of repositioned PICC EXAM: PORTABLE CHEST 1 VIEW COMPARISON:  10/02/2017 FINDINGS: Cardiomediastinal silhouette unchanged. Fullness in the central vasculature. Mixed airspace interstitial opacities in the bilateral mid lungs and lower lungs with partial obscuration of the heart borders and hemidiaphragm. Mild improvement in aeration compared to  the prior plain film Unchanged tracheostomy. Interval reposition of right upper extremity PICC, now with the tip appearing to terminate superior vena cava. No displaced fractures. IMPRESSION: Interval repositioning of right upper extremity PICC, which now appears to terminate in the superior vena cava. Slight improvement in lung aeration, with persisting mixed interstitial and airspace opacities at the bilateral mid and lower lungs, likely with small pleural effusions. Unchanged tracheostomy. Electronically Signed   By: Gilmer Mor D.O.   On: 10/02/2017 09:38   Dg Chest Port 1 View  Addendum Date: 10/02/2017   ADDENDUM REPORT: 10/02/2017 07:39 ADDENDUM: Study discussed  by telephone with RN April Lewis on 10/02/2017 at 0724 hours. We discussed that if the right PICC line is necessary for any continued period of time it should be repositioned or removed and replaced to avoid complications to the right IJ. Electronically Signed   By: Odessa Fleming M.D.   On: 10/02/2017 07:39   Result Date: 10/02/2017 CLINICAL DATA:  61 year old male with respiratory failure. MRSA + C. Perfringens Bacteremia. EXAM: PORTABLE CHEST 1 VIEW COMPARISON:  09/30/2017 and earlier. FINDINGS: Portable AP semi upright view at 0427 hours. The patient is more rotated to the right. Stable tracheostomy. The right side PICC line has flipped cephalad into the right internal jugular vein, tip in the mid right neck. Bilateral veiling and coarse pulmonary opacity persists. Overall ventilation stable since 09/30/2017. Dense retrocardiac opacity. No pneumothorax. Stable cardiac size and mediastinal contours. IMPRESSION: 1. Right upper extremity approach PICC line has flipped cephalad into the right IJ, tip in the right mid to upper neck. 2. Continued coarse and confluent bilateral pulmonary opacity. Ventilation stable since 09/30/2017. Electronically Signed: By: Odessa Fleming M.D. On: 10/02/2017 07:05    CULTURES Sputum culture 11/28 > MRSA BCx2 11/28 > MRSA, C perfringens BCx2 11/30 > negative  ANTIBIOTICS: Zosyn 11/28 > 11/29 Vanc 11/28 >> Flagyl 11/30 >>   EVENTS 11/28  Admit 12/03  Extubated  12/04  Re-intubated (suspect medication related) 12/05  TEE   STUDIES TEE 12/6 >> diffuse hypokinesis, LVEF 20-25%, may be a small calcified LV thrombus at the apex, no vegetation  LINES RUE PICC 12/4 >> ETT 12/2 > 12/3 ETT 12/4 > 12/6 ETT 12/7 > 12/7 Trach 12/7 Shiley 8 cuffed >>  ASSESSMENT / PLAN: 61 year old SNF resident with multiple medical issues admitted with MRSA and Clostridium bacteremia, & MRSA HCAP.  Acute hypoxemic respiratory failure in the setting of diffuse pulmonary infiltrates initially  was PNA related now seems more volume related. MRSA pneumonia with ALI Left Pleural Effusion  Pulmonary Edema  Air leak trach P: Titrate O2 for sat of 88-92% PSV as tolerated (did not tolerate today) Trach care per protocol  Diuresis per heart failure team.  ABX as outlined per ID, stop dates in place for 12/14  Follow intermittent CXR > Repeat in AM  Will need trach changed to XLT once track more mature and established. (roughly 12/14)  Septic Shock - volume responsive w/ Clostridium Perfringens and MRSA bacteremia  Systolic HFrEF (20%). Repeat echo shows unchaged EF Elevated troponin, suspect demand ischemia he is not a candidate for intervention; this has trended down  P: Cardiology/Heart failure Following  Cardiac monitoring  KVO IVF Coreg started today  History of left ventricular thrombus, on chronic warfarin therapy Anemia  P: Heparin infusion per pharmacy Convert back to coumadin once off flagyl Trend CBC  Acute on chronic kidney disease (improving) Mild hyperchloremia P: Continue free water 250 ml water  q6 Trend BMP/urinary output Replace electrolytes as indicated  Metabolic encephalopathy (improving); with history of stroke and left-sided hemiplegia since June 2018.  P: PT efforts  Hold home zanaflex > if restarted would reduce to lowest dose (oversedate with restart)  Valium  QD (home med)  PRN fentanyl and versed     Constipation  Hx of dysphasia; has chronic PEG, tolerates soft diet at SNF Hx of Hep C P: TF per Nutrition  Miralax daily  PEG care per protocol  Pepcid for SUP   DM P: CBG with SSI   Sacral Decubitus - moisture associated  P: Frequent turning  WOC directing care Space boots for heel protection  Foam dressing to sacrum, heels  Hx Mechanical Falls with Prior Hip Fracture P: Monitor / fall precautions  PT efforts as able   FAMILY  - Updates:  Patient updated on plan of care 12/10. No family available  - Global:  ICU  disposition.  Nearing SDU transfer.  Joneen Roach, AGACNP-BC [facility] Pulmonology/Critical Care Pager [phone] or [phone]  10/02/2017 11:21 AM

## 2017-10-02 NOTE — Progress Notes (Signed)
ANTICOAGULATION CONSULT NOTE - Follow Up Consult  Pharmacy Consult:  Heparin Indication:  History of atrial thrombus  Patient Measurements: Height: 6' (182.9 cm) Weight: 202 lb 6.1 oz (91.8 kg) IBW/kg (Calculated) : 77.6 Heparin Dosing Weight:  97 kg  Vital Signs: Temp: 98 F (36.7 C) (12/10 0357) Temp Source: Oral (12/10 0357) BP: 114/81 (12/10 0400) Pulse Rate: 111 (12/10 0400)  Labs: Recent Labs    09/30/17 0423  10/01/17 0500 10/01/17 1403 10/01/17 1745 10/01/17 2208 10/02/17 0426  HGB 10.3*  --  9.9*  --   --   --  10.0*  HCT 32.7*  --  31.2*  --   --   --  31.5*  PLT 261  --  257  --   --   --  280  HEPARINUNFRC 0.28*   < > 0.23* 0.18*  --  0.27* 0.23*  CREATININE 1.98*  --  2.01*  --  1.92*  --   --    < > = values in this interval not displayed.    Estimated Creatinine Clearance: 43.8 mL/min (A) (by C-G formula based on SCr of 1.92 mg/dL (H)).  Assessment: 23 YOM with history of atrial thrombus to continue on IV heparin while Coumadin is on hold.   Heparin level sub-therapeutic this AM despite rate increase, no issues with infusion per RN.   Goal of Therapy:  Heparin level 0.3-0.7 units/ml Monitor platelets by anticoagulation protocol: Yes   Plan:  Inc heparin to 2950 units/hr 1300 HL  Abran Duke, PharmD, BCPS Clinical Pharmacist Phone: [phone]

## 2017-10-02 NOTE — Progress Notes (Signed)
CPT not done at this time due to line placement. The pt must remain in upright position per RN. Will continue to monitor

## 2017-10-02 NOTE — Progress Notes (Signed)
PT Cancellation Note  Patient Details Name: Curtis Keller MRN: [id number] DOB: Nov 28, 1955   Cancelled Treatment:    Reason Eval/Treat Not Completed: Medical issues which prohibited therapy(issues with Central line, will hold at this time)   Fabio Asa 10/02/2017, 9:11 AM Charlotte Crumb, PT [name]  Board Certified Neurologic Specialist [phone]

## 2017-10-02 NOTE — Progress Notes (Signed)
PICC line being flushed by IV team to get it in better position - pt placed in high fowlers - IV therapy here ordered port 1 View CXR for 0915 to clarify position of PICC line DR  Jearld Pies here - aware of IV status - orders received for potassium and Lasix

## 2017-10-02 NOTE — Progress Notes (Signed)
Patient ID: Curtis Keller, male   DOB: 1956-05-19, 61 y.o.   MRN: [id number]      Advanced Heart Failure Rounding Note   Subjective:    Patient was extubated 12/6. Respiratory distress on 12/7 and re-intubated.  He then had tracheostomy placed.    This morning, he is awake and talking with trach in place. CVP 9-10.  Creatinine 1.8.    Objective:   Weight Range: 202 lb 6.1 oz (91.8 kg) Body mass index is 27.45 kg/m.   Vital Signs:   Temp:  [98 F (36.7 C)-99.7 F (37.6 C)] 98 F (36.7 C) (12/10 0357) Pulse Rate:  [77-122] 111 (12/10 0700) Resp:  [16-26] 16 (12/10 0700) BP: (99-128)/(75-96) 110/83 (12/10 0700) SpO2:  [89 %-98 %] 94 % (12/10 0700) FiO2 (%):  [50 %] 50 % (12/10 0400) Weight:  [202 lb 6.1 oz (91.8 kg)] 202 lb 6.1 oz (91.8 kg) (12/10 0159) Last BM Date: 09/30/17  Weight change: Filed Weights   09/30/17 0454 10/01/17 0500 10/02/17 0159  Weight: 210 lb 15.7 oz (95.7 kg) 213 lb 6.5 oz (96.8 kg) 202 lb 6.1 oz (91.8 kg)    Intake/Output:   Intake/Output Summary (Last 24 hours) at 10/02/2017 0846 Last data filed at 10/02/2017 0600 Gross per 24 hour  Intake 2343.35 ml  Output 2525 ml  Net -181.65 ml      Physical Exam    General: NAD, trach Neck: JVP difficult (trach in place), no thyromegaly or thyroid nodule.  Lungs: Rhonchi bilaterally. CV: Nondisplaced PMI.  Heart mildly tachy regular S1/S2, no S3/S4, no murmur.  No peripheral edema.   Abdomen: Soft, nontender, no hepatosplenomegaly, no distention.  Skin: Intact without lesions or rashes.  Neurologic: Alert and oriented x 3.  Psych: Normal affect. Extremities: No clubbing or cyanosis.  HEENT: Normal.    Telemetry   Sinus tachy with PVCs  Labs    CBC Recent Labs    10/01/17 0500 10/02/17 0426  WBC 21.6* 16.6*  HGB 9.9* 10.0*  HCT 31.2* 31.5*  MCV 96.9 97.2  PLT 257 527   Basic Metabolic Panel Recent Labs    10/01/17 0500 10/01/17 1745 10/02/17 0426  NA 147* 147* 148*  K  3.7 3.8 3.5  CL 113* 114* 113*  CO2 24 26 28   GLUCOSE 113* 139* 118*  BUN 68* 66* 65*  CREATININE 2.01* 1.92* 1.82*  CALCIUM 8.8* 8.6* 8.7*  MG 2.0  --  1.9  PHOS 3.2  --  3.4   Liver Function Tests No results for input(s): AST, ALT, ALKPHOS, BILITOT, PROT, ALBUMIN in the last 72 hours. No results for input(s): LIPASE, AMYLASE in the last 72 hours. Cardiac Enzymes No results for input(s): CKTOTAL, CKMB, CKMBINDEX, TROPONINI in the last 72 hours.  BNP: BNP (last 3 results) Recent Labs    03/17/17 1438 03/30/17 0629 09/20/17 0545  BNP 601.2* 199.5* 1,771.9*    ProBNP (last 3 results) No results for input(s): PROBNP in the last 8760 hours.   D-Dimer No results for input(s): DDIMER in the last 72 hours. Hemoglobin A1C No results for input(s): HGBA1C in the last 72 hours. Fasting Lipid Panel No results for input(s): CHOL, HDL, LDLCALC, TRIG, CHOLHDL, LDLDIRECT in the last 72 hours. Thyroid Function Tests No results for input(s): TSH, T4TOTAL, T3FREE, THYROIDAB in the last 72 hours.  Invalid input(s): FREET3  Other results:   Imaging    Dg Chest Port 1 View  Addendum Date: 10/02/2017   ADDENDUM REPORT: 10/02/2017  07:39 ADDENDUM: Study discussed by telephone with RN April Lewis on 10/02/2017 at 0724 hours. We discussed that if the right PICC line is necessary for any continued period of time it should be repositioned or removed and replaced to avoid complications to the right IJ. Electronically Signed   By: Genevie Ann M.D.   On: 10/02/2017 07:39   Result Date: 10/02/2017 CLINICAL DATA:  61 year old male with respiratory failure. MRSA + C. Perfringens Bacteremia. EXAM: PORTABLE CHEST 1 VIEW COMPARISON:  09/30/2017 and earlier. FINDINGS: Portable AP semi upright view at 0427 hours. The patient is more rotated to the right. Stable tracheostomy. The right side PICC line has flipped cephalad into the right internal jugular vein, tip in the mid right neck. Bilateral veiling and  coarse pulmonary opacity persists. Overall ventilation stable since 09/30/2017. Dense retrocardiac opacity. No pneumothorax. Stable cardiac size and mediastinal contours. IMPRESSION: 1. Right upper extremity approach PICC line has flipped cephalad into the right IJ, tip in the right mid to upper neck. 2. Continued coarse and confluent bilateral pulmonary opacity. Ventilation stable since 09/30/2017. Electronically Signed: By: Genevie Ann M.D. On: 10/02/2017 07:05     Medications:     Scheduled Medications: . acetylcysteine  2 mL Nebulization Q6H  . albuterol  2.5 mg Nebulization Q6H  . atorvastatin  40 mg Per Tube QHS  . carvedilol  3.125 mg Per Tube BID WC  . chlorhexidine gluconate (MEDLINE KIT)  15 mL Mouth Rinse BID  . Chlorhexidine Gluconate Cloth  6 each Topical Daily  . diazepam  2 mg Oral Daily  . famotidine  20 mg Per Tube BID  . feeding supplement (PRO-STAT SUGAR FREE 64)  30 mL Per Tube Daily  . free water  250 mL Per Tube Q6H  . furosemide  40 mg Intravenous Once  . insulin aspart  0-15 Units Subcutaneous Q4H  . mouth rinse  15 mL Mouth Rinse QID  . multivitamin  15 mL Per Tube Daily  . polyethylene glycol  17 g Oral Daily  . senna-docusate  1 tablet Oral Daily  . sodium chloride flush  10-40 mL Intracatheter Q12H    Infusions: . sodium chloride 250 mL (10/01/17 0700)  . feeding supplement (VITAL AF 1.2 CAL) 1,000 mL (10/02/17 0200)  . heparin 2,950 Units/hr (10/02/17 0520)  . metronidazole Stopped (10/02/17 1761)  . potassium chloride    . vancomycin Stopped (10/01/17 0921)    PRN Medications: sodium chloride, bisacodyl, fentaNYL (SUBLIMAZE) injection, metoprolol tartrate, midazolam, sodium chloride flush    Patient Profile   Curtis Keller is a 61 y.o. male with history of COPD, Systolic CHF EF 60%, CAD, hx of CVA with left sided weakness, h/o of prolonged vent wean s/p trach, peg s/p decannulation, h/o Hepatitis C, as well as known apical mural thrombus  admitted with worsening SOB and AMS  Found to have MRSA PNA and C. Perfringens + MRSA bacteremia.   Assessment/Plan   1. Septic shock: Hypotensive initially, MRSA PNA with MRSA and C perfringens in blood.  He is now off pressors. BP stable today.  2. Acute hypoxemic respiratory failure: MRSA PNA.  He now has a tracheostomy. Slow wean planned.   3. Acute on chronic systolic CHF: History of ischemic cardiomyopathy.  TEE this admission with EF 20-25%.  CVP 9-10 today. - Lasix 40 mg IV x 1 today.  - Would continue to follow CVPs.    - Check co-ox in am.  - Add Coreg 3.125 mg bid  with frequent PVCs.  3. PVCs/short (3 beats) NSVT:   - Add Coreg today.  - I do not think that he will be an ICD candidate given baseline debilitation (left hemiparesis, PEG, living in SNF).  4. ID: No endocarditis on TEE.  Had MRSA PNA with MRSA and C perfringens in blood. On vancomycin and Flagyl.  5. CAD: Has history of BMS to RCA in 2016.  Troponin to 2.6 this admission, could be demand ischemia from septic shock.   - Continue statin.  - No ASA with full anticoagulation.  - Consider eventual cath but would need to see improved renal function.  6. AKI on CKD: Creatinine trending down, 1.8 today.   7. LV thrombus: Chronic warfarin, TEE appeared to show a small calcified LV thrombus.   - Currently on heparin gtt, will need eventual conversion back to warfarin.   Length of Stay: 53  Loralie Champagne, MD  10/02/2017, 8:46 AM  Advanced Heart Failure Team Pager [phone] (M-F; 7a - 4p)  Please contact [facility] for night-coverage after hours (4p -7a ) and weekends on [email or web address]

## 2017-10-02 NOTE — Progress Notes (Signed)
ANTICOAGULATION CONSULT NOTE - Follow Up Consult  Pharmacy Consult:  Heparin Indication:  History of atrial thrombus  Allergies  Allergen Reactions  . Codeine Anaphylaxis and Nausea And Vomiting  . Lisinopril Itching and Rash  . Hydrocodone Other (See Comments)    On MAR  . Hydrocodone-Acetaminophen Nausea And Vomiting    Cannot take any acetaminophen due to liver issues per sister  . Tegaderm Ag Mesh [Silver] Other (See Comments)    Allergy to "silver compounds" listed on MAR  . Tylenol [Acetaminophen] Nausea Only and Other (See Comments)    Cannot take any acetaminophen due to liver issues per sister    Patient Measurements: Height: 6' (182.9 cm) Weight: 202 lb 6.1 oz (91.8 kg) IBW/kg (Calculated) : 77.6 Heparin Dosing Weight:  changed to 92 kg with decreasing weight  Vital Signs: Temp: 98.6 F (37 C) (12/10 1757) Temp Source: Oral (12/10 1757) BP: 102/79 (12/10 1700) Pulse Rate: 125 (12/10 1700)  Labs: Recent Labs    09/30/17 0423  10/01/17 0500  10/01/17 1745 10/01/17 2208 10/02/17 0426 10/02/17 1642  HGB 10.3*  --  9.9*  --   --   --  10.0*  --   HCT 32.7*  --  31.2*  --   --   --  31.5*  --   PLT 261  --  257  --   --   --  280  --   HEPARINUNFRC 0.28*   < > 0.23*   < >  --  0.27* 0.23* 0.17*  CREATININE 1.98*  --  2.01*  --  1.92*  --  1.82*  --    < > = values in this interval not displayed.    Estimated Creatinine Clearance: 46.2 mL/min (A) (by C-G formula based on SCr of 1.82 mg/dL (H)).     Assessment: 8 YOM with history of atrial thrombus to continue on IV heparin while Coumadin is on hold.  PICC migrated into the IJ this AM and issue has resolved.  Heparin level is sub-therapeutic and trended down.  Lab obtained this evening is from a peripheral stick and is accurate.  No issue with heparin infusion per RN.  Concerned that heparin infusion has to be increased continually due to sub-therapeutic levels.  Currently at 32 units/kg/hr, which is quite  significant.   Goal of Therapy:  Heparin level 0.3-0.7 units/ml Monitor platelets by anticoagulation protocol: Yes    Plan:  Heparin 2000 units IV bolus x 1, then Increase heparin gtt to 3250 units/hr (35 units/kg/hr)  Check 6 hr heparin level   Raechell Singleton D. Laney Potash, PharmD, BCPS Pager:  [phone] 10/02/2017, 6:14 PM

## 2017-10-03 ENCOUNTER — Inpatient Hospital Stay (HOSPITAL_COMMUNITY): Payer: Medicare Other

## 2017-10-03 LAB — BASIC METABOLIC PANEL
Anion gap: 6 (ref 5–15)
BUN: 60 mg/dL — ABNORMAL HIGH (ref 6–20)
CALCIUM: 8.7 mg/dL — AB (ref 8.9–10.3)
CO2: 27 mmol/L (ref 22–32)
CREATININE: 1.8 mg/dL — AB (ref 0.61–1.24)
Chloride: 111 mmol/L (ref 101–111)
GFR, EST AFRICAN AMERICAN: 45 mL/min — AB (ref >60)
GFR, EST NON AFRICAN AMERICAN: 39 mL/min — AB (ref >60)
Glucose, Bld: 119 mg/dL — ABNORMAL HIGH (ref 65–99)
Potassium: 3.7 mmol/L (ref 3.5–5.1)
SODIUM: 144 mmol/L (ref 135–145)

## 2017-10-03 LAB — GLUCOSE, CAPILLARY
GLUCOSE-CAPILLARY: 103 mg/dL — AB (ref 65–99)
GLUCOSE-CAPILLARY: 115 mg/dL — AB (ref 65–99)
GLUCOSE-CAPILLARY: 118 mg/dL — AB (ref 65–99)
Glucose-Capillary: 120 mg/dL — ABNORMAL HIGH (ref 65–99)
Glucose-Capillary: 124 mg/dL — ABNORMAL HIGH (ref 65–99)

## 2017-10-03 LAB — CBC
HCT: 30.6 % — ABNORMAL LOW (ref 39.0–52.0)
Hemoglobin: 9.5 g/dL — ABNORMAL LOW (ref 13.0–17.0)
MCH: 30.2 pg (ref 26.0–34.0)
MCHC: 31 g/dL (ref 30.0–36.0)
MCV: 97.1 fL (ref 78.0–100.0)
PLATELETS: 315 10*3/uL (ref 150–400)
RBC: 3.15 MIL/uL — ABNORMAL LOW (ref 4.22–5.81)
RDW: 15.4 % (ref 11.5–15.5)
WBC: 10.9 10*3/uL — ABNORMAL HIGH (ref 4.0–10.5)

## 2017-10-03 LAB — HEPARIN LEVEL (UNFRACTIONATED)
HEPARIN UNFRACTIONATED: 0.7 [IU]/mL (ref 0.30–0.70)
Heparin Unfractionated: 0.6 IU/mL (ref 0.30–0.70)
Heparin Unfractionated: 0.67 IU/mL (ref 0.30–0.70)
Heparin Unfractionated: 0.81 IU/mL — ABNORMAL HIGH (ref 0.30–0.70)

## 2017-10-03 LAB — COOXEMETRY PANEL
CARBOXYHEMOGLOBIN: 1.5 % (ref 0.5–1.5)
METHEMOGLOBIN: 0.7 % (ref 0.0–1.5)
O2 Saturation: 74.9 %
TOTAL HEMOGLOBIN: 9.5 g/dL — AB (ref 12.0–16.0)

## 2017-10-03 LAB — MAGNESIUM: MAGNESIUM: 2 mg/dL (ref 1.7–2.4)

## 2017-10-03 LAB — PHOSPHORUS: PHOSPHORUS: 3.8 mg/dL (ref 2.5–4.6)

## 2017-10-03 MED ORDER — COLLAGENASE 250 UNIT/GM EX OINT
TOPICAL_OINTMENT | Freq: Every day | CUTANEOUS | Status: DC
  Administered 2017-10-03 – 2017-10-10 (×7): via TOPICAL
  Filled 2017-10-03 (×2): qty 30

## 2017-10-03 MED ORDER — FUROSEMIDE 40 MG PO TABS
40.0000 mg | ORAL_TABLET | Freq: Every day | ORAL | Status: DC
Start: 2017-10-03 — End: 2017-10-04
  Administered 2017-10-03: 40 mg via ORAL
  Filled 2017-10-03 (×2): qty 1

## 2017-10-03 MED ORDER — HEPARIN (PORCINE) IN NACL 100-0.45 UNIT/ML-% IJ SOLN
2400.0000 [IU]/h | INTRAMUSCULAR | Status: DC
  Administered 2017-10-03 – 2017-10-06 (×6): 2950 [IU]/h via INTRAVENOUS
  Administered 2017-10-06 – 2017-10-07 (×4): 2850 [IU]/h via INTRAVENOUS
  Administered 2017-10-08 – 2017-10-09 (×2): 2600 [IU]/h via INTRAVENOUS
  Administered 2017-10-10: 2400 [IU]/h via INTRAVENOUS
  Filled 2017-10-03 (×21): qty 250

## 2017-10-03 MED ORDER — POTASSIUM CHLORIDE 20 MEQ PO PACK: 40.0000 meq | PACK | Freq: Once | ORAL | Status: DC

## 2017-10-03 MED ORDER — POTASSIUM CHLORIDE 20 MEQ/15ML (10%) PO SOLN
40.0000 meq | Freq: Once | ORAL | Status: AC
  Administered 2017-10-03: 40 meq via ORAL
  Filled 2017-10-03: qty 30

## 2017-10-03 MED ORDER — ACETYLCYSTEINE 20 % IN SOLN
2.0000 mL | Freq: Two times a day (BID) | RESPIRATORY_TRACT | Status: DC
  Administered 2017-10-03 – 2017-10-04 (×2): 2 mL via RESPIRATORY_TRACT
  Filled 2017-10-03 (×2): qty 4

## 2017-10-03 MED ORDER — CARVEDILOL 6.25 MG PO TABS
6.2500 mg | ORAL_TABLET | Freq: Two times a day (BID) | ORAL | Status: DC
  Administered 2017-10-03 – 2017-10-04 (×2): 6.25 mg
  Filled 2017-10-03 (×3): qty 1

## 2017-10-03 NOTE — Progress Notes (Signed)
ANTICOAGULATION CONSULT NOTE Pharmacy Consult:  Heparin Indication:  History of atrial thrombus  Allergies  Allergen Reactions  . Codeine Anaphylaxis and Nausea And Vomiting  . Lisinopril Itching and Rash  . Hydrocodone Other (See Comments)    On MAR  . Hydrocodone-Acetaminophen Nausea And Vomiting    Cannot take any acetaminophen due to liver issues per sister  . Tegaderm Ag Mesh [Silver] Other (See Comments)    Allergy to "silver compounds" listed on MAR  . Tylenol [Acetaminophen] Nausea Only and Other (See Comments)    Cannot take any acetaminophen due to liver issues per sister    Patient Measurements: Height: 6' (182.9 cm) Weight: 202 lb 6.1 oz (91.8 kg) IBW/kg (Calculated) : 77.6  Vital Signs: Temp: 98.4 F (36.9 C) (12/10 2351) Temp Source: Oral (12/10 2351) BP: 96/81 (12/10 2100) Pulse Rate: 116 (12/10 2300)  Labs: Recent Labs    09/30/17 0423  10/01/17 0500  10/01/17 1745  10/02/17 0426 10/02/17 1642 10/03/17 0013  HGB 10.3*  --  9.9*  --   --   --  10.0*  --   --   HCT 32.7*  --  31.2*  --   --   --  31.5*  --   --   PLT 261  --  257  --   --   --  280  --   --   HEPARINUNFRC 0.28*   < > 0.23*   < >  --    < > 0.23* 0.17* 0.60  CREATININE 1.98*  --  2.01*  --  1.92*  --  1.82*  --   --    < > = values in this interval not displayed.    Estimated Creatinine Clearance: 46.2 mL/min (A) (by C-G formula based on SCr of 1.82 mg/dL (H)).  Assessment: 61 y.o. male with h/o atrial thrombus, Coumadin on hold, for heparin Goal of Therapy:  Heparin level 0.3-0.7 units/ml Monitor platelets by anticoagulation protocol: Yes  Plan:  Continue Heparin at current rate   Geannie Risen, PharmD, BCPS  10/03/2017, 1:08 AM

## 2017-10-03 NOTE — Consult Note (Addendum)
WOC Nurse wound follow-up consult note Reason for Consult: Assessed bilat heels and sacrum/buttocks. Previous deep tissue injury has progressed to an unstageable pressure injury to bilat buttocks and sacrum; affected area is 9X6cm, 100% tightly adhered yellow slough, small amt yellow drainage, no odor.   Wound type: Left heel with previous dark reddish purple deep tissue injury has evolved into a stage 2 pressure injury; 6X5X.1cm, 90% dark red and moist, 10% deep tissue injury in the center (.2X.2cm) Right heel with previous stage 1 pressure injury, has evolved into a deep tissue injury, dark red 1.5X.1.5cm  Pressure Injury POA: No Dressing procedure/placement/frequency: Pt has been on a low airloss bed to reduce pressure and has Prevalon boots to bilat heels to reduce pressure, foam dressing to heels to protect from further injury.  Physical therapy will begin hydrotherapy to assist with removal of nonviable tissue to sacrum/buttocks and Santyl for enzymatic debridement.  Pt is on the vent and critically ill with multiple systemic factors which can impair healing.  No family present to discuss plan of care. Please re-consult if further assistance is needed.  Thank-you,  Cammie Mcgee MSN, RN, CWOCN, [place], CNS [phone]

## 2017-10-03 NOTE — Progress Notes (Signed)
Physical Therapy Treatment Patient Details Name: Curtis Keller MRN: [id number] DOB: [birth year] Today's Date: 10/03/2017    History of Present Illness 61 yr old M, SNF resident,  with PMH of COPD, CHF EF 20%, CAD, apical mural thrombus, right CVA June 2018 admitted 11/28 with reports of altered mental status for 2 days which was thought to be due to UTI. He was altered as per his sister for few days and the morning of admit he started having severe shortness of breath saturating in the 50s with minimal improvemnet on CPAP.  He was intubated in the ED which was very difficult as per the ED physician. Please refer to their note. CXR concerning for bilateral infiltrates, treated with cefepime and vancomycin.     PT Comments    Session limited to bed level and EOB activity by pain and elevated HR. Performed supine therapeutic exercises and progressed to EOB cervical and trunk ROM activities with UE PROM. Patient grimacing throughout with pain in buttocks. Current POC remains appropriate.  Follow Up Recommendations  SNF;Supervision/Assistance - 24 hour     Equipment Recommendations  Other (comment)(TBA)    Recommendations for Other Services       Precautions / Restrictions Precautions Precautions: Fall Restrictions Weight Bearing Restrictions: No    Mobility  Bed Mobility Overal bed mobility: Needs Assistance Bed Mobility: Rolling;Sidelying to Sit Rolling: Mod assist;+2 for safety/equipment Sidelying to sit: Mod assist;+2 for physical assistance;+2 for safety/equipment       General bed mobility comments: Moderate assist to come to upright at EOB, 2nd person for safety and line management as patient impulsive at times  Transfers                 General transfer comment: per nursing, limit to EOB level activities  Ambulation/Gait                 Stairs            Wheelchair Mobility    Modified Rankin (Stroke Patients Only)       Balance Overall  balance assessment: Needs assistance;History of Falls Sitting-balance support: Single extremity supported;Feet supported Sitting balance-Leahy Scale: Poor Sitting balance - Comments: right lateral lean initially, hands on assist provided improved with EOB activity                                    Cognition Arousal/Alertness: Awake/alert Behavior During Therapy: WFL for tasks assessed/performed;Impulsive Overall Cognitive Status: Impaired/Different from baseline Area of Impairment: Safety/judgement;Problem solving;Awareness;Attention;Following commands                   Current Attention Level: Focused   Following Commands: Follows one step commands with increased time Safety/Judgement: Decreased awareness of deficits;Decreased awareness of safety   Problem Solving: Slow processing;Decreased initiation;Difficulty sequencing;Requires verbal cues;Requires tactile cues        Exercises General Exercises - Lower Extremity Ankle Circles/Pumps: AROM;Both;Supine;10 reps Long Arc Quad: AROM;Both;5 reps Heel Slides: Both;5 reps;Supine;PROM Hip Flexion/Marching: PROM;Supine    General Comments        Pertinent Vitals/Pain Pain Assessment: Faces Faces Pain Scale: Hurts whole lot Pain Location: buttocks and bil heels Pain Descriptors / Indicators: Aching;Grimacing;Guarding Pain Intervention(s): Monitored during session    Home Living                      Prior Function  PT Goals (current goals can now be found in the care plan section) Acute Rehab PT Goals Patient Stated Goal: to get better PT Goal Formulation: With patient Time For Goal Achievement: 10/10/17 Potential to Achieve Goals: Good Progress towards PT goals: Progressing toward goals    Frequency    Min 2X/week      PT Plan Current plan remains appropriate    Co-evaluation              AM-PAC PT "6 Clicks" Daily Activity  Outcome Measure  Difficulty  turning over in bed (including adjusting bedclothes, sheets and blankets)?: Unable Difficulty moving from lying on back to sitting on the side of the bed? : Unable Difficulty sitting down on and standing up from a chair with arms (e.g., wheelchair, bedside commode, etc,.)?: Unable Help needed moving to and from a bed to chair (including a wheelchair)?: Total Help needed walking in hospital room?: Total Help needed climbing 3-5 steps with a railing? : Total 6 Click Score: 6    End of Session Equipment Utilized During Treatment: Gait belt;Oxygen Activity Tolerance: Patient limited by fatigue Patient left: in bed;with call bell/phone within reach;with bed alarm set(mitts applied) Nurse Communication: Mobility status;Need for lift equipment PT Visit Diagnosis: Unsteadiness on feet (R26.81);Muscle weakness (generalized) (M62.81);Pain Pain - part of body: (buttocks)     Time: [id number] PT Time Calculation (min) (ACUTE ONLY): 21 min  Charges:  $Therapeutic Exercise: 8-22 mins                    G Codes:       Charlotte Crumbevon Tyton Abdallah, PT [name]  Board Certified Neurologic Specialist [phone]    Fabio AsaDevon J Samarra Ridgely 10/03/2017, 4:37 PM

## 2017-10-03 NOTE — Progress Notes (Signed)
ANTICOAGULATION CONSULT NOTE Pharmacy Consult:  Heparin Indication:  History of atrial thrombus  Allergies  Allergen Reactions  . Codeine Anaphylaxis and Nausea And Vomiting  . Lisinopril Itching and Rash  . Hydrocodone Other (See Comments)    On MAR  . Hydrocodone-Acetaminophen Nausea And Vomiting    Cannot take any acetaminophen due to liver issues per sister  . Tegaderm Ag Mesh [Silver] Other (See Comments)    Allergy to "silver compounds" listed on MAR  . Tylenol [Acetaminophen] Nausea Only and Other (See Comments)    Cannot take any acetaminophen due to liver issues per sister    Patient Measurements: Height: 6' (182.9 cm) Weight: 202 lb 6.1 oz (91.8 kg) IBW/kg (Calculated) : 77.6  Heparin Dosing weight: 91.8 kg  Vital Signs: Temp: 98.6 F (37 C) (12/11 0431) Temp Source: Oral (12/11 0431) BP: 121/91 (12/11 0806) Pulse Rate: 124 (12/11 0806)  Labs: Recent Labs    10/01/17 0500  10/01/17 1745  10/02/17 0426 10/02/17 1642 10/03/17 0013 10/03/17 0512  HGB 9.9*  --   --   --  10.0*  --   --  9.5*  HCT 31.2*  --   --   --  31.5*  --   --  30.6*  PLT 257  --   --   --  280  --   --  315  HEPARINUNFRC 0.23*   < >  --    < > 0.23* 0.17* 0.60 0.70  CREATININE 2.01*  --  1.92*  --  1.82*  --   --  1.80*   < > = values in this interval not displayed.    Estimated Creatinine Clearance: 46.7 mL/min (A) (by C-G formula based on SCr of 1.8 mg/dL (H)).  Assessment: 61 y.o. male with h/o atrial thrombus, Coumadin on hold, for heparin.   Heparin level 0.7 on 3250 units/hr.  Hgb 9.5, Platelets 315.  No bleeding reported.   Goal of Therapy:  Heparin level 0.3-0.7 units/ml Monitor platelets by anticoagulation protocol: Yes  Plan:  Decrease Heparin to 3150 units/hr Recheck Heparin level in 6 hours to confirm Daily Heparin level and CBC while on therapy.   Link Snuffer, PharmD, BCPS, BCCCP Clinical Pharmacist Clinical phone 10/03/2017 until 3:30PM [phone] After  hours, please call #[phone] 10/03/2017, 8:09 AM

## 2017-10-03 NOTE — Progress Notes (Signed)
Patient ID: Curtis Keller, male   DOB: [birth year], 61 y.o.   MRN: [id number]      Advanced Heart Failure Rounding Note   Subjective:    Patient was extubated 12/6. Respiratory distress on 12/7 and re-intubated.  He then had tracheostomy placed.    Yesterday diuresed with IV lasix and low dose carvedilol started. CVP 4 today.   Follows commands. Complaining of buttock pain from pressure ulcer.    Objective:   Weight Range: 202 lb 6.1 oz (91.8 kg) Body mass index is 27.45 kg/m.   Vital Signs:   Temp:  [98.4 F (36.9 C)-98.9 F (37.2 C)] 98.4 F (36.9 C) (12/11 0843) Pulse Rate:  [113-125] 124 (12/11 0806) Resp:  [13-33] 19 (12/11 0800) BP: (96-123)/(69-94) 121/91 (12/11 0806) SpO2:  [87 %-99 %] 91 % (12/11 0800) FiO2 (%):  [40 %-50 %] 40 % (12/11 0800) Weight:  [202 lb 6.1 oz (91.8 kg)] 202 lb 6.1 oz (91.8 kg) (12/11 0500) Last BM Date: 09/30/17  Weight change: Filed Weights   10/01/17 0500 10/02/17 0159 10/03/17 0500  Weight: 213 lb 6.5 oz (96.8 kg) 202 lb 6.1 oz (91.8 kg) 202 lb 6.1 oz (91.8 kg)    Intake/Output:   Intake/Output Summary (Last 24 hours) at 10/03/2017 0847 Last data filed at 10/03/2017 0811 Gross per 24 hour  Intake 3305.43 ml  Output 2810 ml  Net 495.43 ml      Physical Exam  CVP 4 General:  NAD  HEENT: Trach Neck: supple. no JVD. Carotids 2+ bilat; no bruits. No lymphadenopathy or thryomegaly appreciated. Cor: PMI nondisplaced. Regular rate & rhythm. No rubs, gallops or murmurs. Lungs: coarse throughout Abdomen: soft, nontender, nondistended. No hepatosplenomegaly. No bruits or masses. Good bowel sounds. Extremities: no cyanosis, clubbing, rash, edema. RUE PICC  Neuro: Trach follows commands. MAE x4.   Telemetry   Sinus Tach with PVCs and NSVT 120s.   Labs    CBC Recent Labs    10/02/17 0426 10/03/17 0512  WBC 16.6* 10.9*  HGB 10.0* 9.5*  HCT 31.5* 30.6*  MCV 97.2 97.1  PLT 280 277   Basic Metabolic Panel Recent Labs      10/02/17 0426 10/03/17 0512  NA 148* 144  K 3.5 3.7  CL 113* 111  CO2 28 27  GLUCOSE 118* 119*  BUN 65* 60*  CREATININE 1.82* 1.80*  CALCIUM 8.7* 8.7*  MG 1.9 2.0  PHOS 3.4 3.8   Liver Function Tests No results for input(s): AST, ALT, ALKPHOS, BILITOT, PROT, ALBUMIN in the last 72 hours. No results for input(s): LIPASE, AMYLASE in the last 72 hours. Cardiac Enzymes No results for input(s): CKTOTAL, CKMB, CKMBINDEX, TROPONINI in the last 72 hours.  BNP: BNP (last 3 results) Recent Labs    03/17/17 1438 03/30/17 0629 09/20/17 0545  BNP 601.2* 199.5* 1,771.9*    ProBNP (last 3 results) No results for input(s): PROBNP in the last 8760 hours.   D-Dimer No results for input(s): DDIMER in the last 72 hours. Hemoglobin A1C No results for input(s): HGBA1C in the last 72 hours. Fasting Lipid Panel No results for input(s): CHOL, HDL, LDLCALC, TRIG, CHOLHDL, LDLDIRECT in the last 72 hours. Thyroid Function Tests No results for input(s): TSH, T4TOTAL, T3FREE, THYROIDAB in the last 72 hours.  Invalid input(s): FREET3  Other results:   Imaging    Dg Chest Port 1 View  Result Date: 10/03/2017 CLINICAL DATA:  Shortness of Breath EXAM: PORTABLE CHEST 1 VIEW COMPARISON:  October 02, 2017 FINDINGS: Tracheostomy catheter tip is 6.2 cm above the carina. Central catheter tip is at the cavoatrial junction. No pneumothorax. There are moderate pleural effusions bilaterally with interstitial edema bilaterally. There is bibasilar atelectasis. There is cardiomegaly with pulmonary venous hypertension. No adenopathy. There is an old healed fracture of the right clavicle. IMPRESSION: Tube and catheter positions as described without evident pneumothorax. Pleural effusions with edema persist. Cardiomegaly with pulmonary venous hypertension persists. Bibasilar atelectasis is stable. No new opacity evident. Electronically Signed   By: Lowella Grip III M.D.   On: 10/03/2017 07:04   Dg  Chest Port 1 View  Result Date: 10/02/2017 CLINICAL DATA:  61 year old male with a history of repositioned PICC EXAM: PORTABLE CHEST 1 VIEW COMPARISON:  10/02/2017 FINDINGS: Cardiomediastinal silhouette unchanged. Fullness in the central vasculature. Mixed airspace interstitial opacities in the bilateral mid lungs and lower lungs with partial obscuration of the heart borders and hemidiaphragm. Mild improvement in aeration compared to the prior plain film Unchanged tracheostomy. Interval reposition of right upper extremity PICC, now with the tip appearing to terminate superior vena cava. No displaced fractures. IMPRESSION: Interval repositioning of right upper extremity PICC, which now appears to terminate in the superior vena cava. Slight improvement in lung aeration, with persisting mixed interstitial and airspace opacities at the bilateral mid and lower lungs, likely with small pleural effusions. Unchanged tracheostomy. Electronically Signed   By: Corrie Mckusick D.O.   On: 10/02/2017 09:38     Medications:     Scheduled Medications: . acetylcysteine  2 mL Nebulization Q6H  . albuterol  2.5 mg Nebulization Q6H  . atorvastatin  40 mg Per Tube QHS  . carvedilol  3.125 mg Per Tube BID WC  . chlorhexidine gluconate (MEDLINE KIT)  15 mL Mouth Rinse BID  . Chlorhexidine Gluconate Cloth  6 each Topical Daily  . diazepam  2 mg Oral Daily  . famotidine  20 mg Per Tube Daily  . feeding supplement (PRO-STAT SUGAR FREE 64)  30 mL Per Tube Daily  . free water  250 mL Per Tube Q6H  . insulin aspart  0-15 Units Subcutaneous Q4H  . mouth rinse  15 mL Mouth Rinse QID  . multivitamin  15 mL Per Tube Daily  . polyethylene glycol  17 g Oral Daily  . senna-docusate  1 tablet Oral Daily  . sodium chloride flush  10-40 mL Intracatheter Q12H    Infusions: . sodium chloride 250 mL (10/01/17 0700)  . feeding supplement (VITAL AF 1.2 CAL) 1,000 mL (10/02/17 1846)  . heparin 3,150 Units/hr (10/03/17 0817)  .  metronidazole Stopped (10/03/17 0340)  . vancomycin Stopped (10/02/17 1049)    PRN Medications: sodium chloride, bisacodyl, fentaNYL (SUBLIMAZE) injection, metoprolol tartrate, midazolam, sodium chloride flush    Patient Profile   Curtis Keller is a 61 y.o. male with history of COPD, Systolic CHF EF 16%, CAD, hx of CVA with left sided weakness, h/o of prolonged vent wean s/p trach, peg s/p decannulation, h/o Hepatitis C, as well as known apical mural thrombus admitted with worsening SOB and AMS  Found to have MRSA PNA and C. Perfringens + MRSA bacteremia.   Assessment/Plan   1. Septic shock: Hypotensive initially, MRSA PNA with MRSA and C perfringens in blood.  He is now off pressors. Stable today.   2. Acute hypoxemic respiratory failure: MRSA PNA.  He now has a tracheostomy. Slow wean planned.   3. Acute on chronic systolic CHF: History of ischemic cardiomyopathy.  TEE  this admission with EF 20-25%.  CVP 3-4. CO-OX 75%  - Stop IV lasix. Start lasix 40 mg po daily.  - Increase coreg 6.25 mg twice a day.   3. PVCs/short episodes of NSVT.  - (3 beats) NSVT:   - Increase coreg as above.  - Not thought to be ICD candidate given baseline debilitation (left hemiparesis, PEG, living in SNF).  4. ID: No endocarditis on TEE.  Had MRSA PNA with MRSA and C perfringens in blood. On vancomycin and Flagyl.  5. CAD: Has history of BMS to RCA in 2016.  Troponin to 2.6 this admission, could be demand ischemia from septic shock.   - Continue statin.  - No ASA with full anticoagulation.  - Consider eventual cath but would need to see improved renal function.  6. AKI on CKD: Creatinine unchanged 1.8.    7. LV thrombus: Chronic warfarin, TEE appeared to show a small calcified LV thrombus.   - Currently on heparin gtt, will need eventual conversion back to warfarin.   Length of Stay: [place], NP  10/03/2017, 8:47 AM  Advanced Heart Failure Team Pager [phone] (M-F; 7a - 4p)  Please  contact [facility] for night-coverage after hours (4p -7a ) and weekends on [email or web address]  Patient seen with NP, agree with the above note.  Still requiring full support via tracheostomy.  CVP 4, co-ox 75%.  Had a 20 beat runs NSVT.  Renal function stable.  - Increase Coreg to 6.25 mg bid and replace K.  - Will need to transition from heparin gtt to warfarin eventually.   Loralie Champagne 10/03/2017 4:00 PM

## 2017-10-03 NOTE — Progress Notes (Signed)
ANTICOAGULATION CONSULT NOTE Pharmacy Consult:  Heparin Indication:  History of atrial thrombus  Allergies  Allergen Reactions  . Codeine Anaphylaxis and Nausea And Vomiting  . Lisinopril Itching and Rash  . Hydrocodone Other (See Comments)    On MAR  . Hydrocodone-Acetaminophen Nausea And Vomiting    Cannot take any acetaminophen due to liver issues per sister  . Tegaderm Ag Mesh [Silver] Other (See Comments)    Allergy to "silver compounds" listed on MAR  . Tylenol [Acetaminophen] Nausea Only and Other (See Comments)    Cannot take any acetaminophen due to liver issues per sister    Patient Measurements: Height: 6' (182.9 cm) Weight: 202 lb 6.1 oz (91.8 kg) IBW/kg (Calculated) : 77.6  Vital Signs: Temp: 98.4 F (36.9 C) (12/11 2052) Temp Source: Oral (12/11 2052) BP: 113/81 (12/11 2304) Pulse Rate: 72 (12/11 2304)  Labs: Recent Labs    10/01/17 0500  10/01/17 1745  10/02/17 0426  10/03/17 0512 10/03/17 1410 10/03/17 2247  HGB 9.9*  --   --   --  10.0*  --  9.5*  --   --   HCT 31.2*  --   --   --  31.5*  --  30.6*  --   --   PLT 257  --   --   --  280  --  315  --   --   HEPARINUNFRC 0.23*   < >  --    < > 0.23*   < > 0.70 0.81* 0.67  CREATININE 2.01*  --  1.92*  --  1.82*  --  1.80*  --   --    < > = values in this interval not displayed.    Estimated Creatinine Clearance: 46.7 mL/min (A) (by C-G formula based on SCr of 1.8 mg/dL (H)).  Assessment: 61 y.o. male with h/o atrial thrombus, Coumadin on hold, for heparin Goal of Therapy:  Heparin level 0.3-0.7 units/ml Monitor platelets by anticoagulation protocol: Yes  Plan:  Continue Heparin at current rate   Geannie Risen, PharmD, BCPS  10/03/2017, 11:48 PM

## 2017-10-03 NOTE — Progress Notes (Signed)
ANTICOAGULATION CONSULT NOTE - Follow Up Consult  Pharmacy Consult:  Heparin Indication:  History of atrial thrombus  Allergies  Allergen Reactions  . Codeine Anaphylaxis and Nausea And Vomiting  . Lisinopril Itching and Rash  . Hydrocodone Other (See Comments)    On MAR  . Hydrocodone-Acetaminophen Nausea And Vomiting    Cannot take any acetaminophen due to liver issues per sister  . Tegaderm Ag Mesh [Silver] Other (See Comments)    Allergy to "silver compounds" listed on MAR  . Tylenol [Acetaminophen] Nausea Only and Other (See Comments)    Cannot take any acetaminophen due to liver issues per sister    Patient Measurements: Height: 6' (182.9 cm) Weight: 202 lb 6.1 oz (91.8 kg) IBW/kg (Calculated) : 77.6 Heparin Dosing Weight:  changed to 92 kg with decreasing weight  Vital Signs: Temp: 98.2 F (36.8 C) (12/11 1151) Temp Source: Oral (12/11 1151) BP: 133/87 (12/11 1200) Pulse Rate: 112 (12/11 1200)  Labs: Recent Labs    10/01/17 0500  10/01/17 1745  10/02/17 0426  10/03/17 0013 10/03/17 0512 10/03/17 1410  HGB 9.9*  --   --   --  10.0*  --   --  9.5*  --   HCT 31.2*  --   --   --  31.5*  --   --  30.6*  --   PLT 257  --   --   --  280  --   --  315  --   HEPARINUNFRC 0.23*   < >  --    < > 0.23*   < > 0.60 0.70 0.81*  CREATININE 2.01*  --  1.92*  --  1.82*  --   --  1.80*  --    < > = values in this interval not displayed.    Estimated Creatinine Clearance: 46.7 mL/min (A) (by C-G formula based on SCr of 1.8 mg/dL (H)).     Assessment: 64 YOM with history of atrial thrombus to continue on IV heparin while Coumadin is on hold.  Has been difficult to keep patient therapeutic.  Heparin level is currently elevated; no bleeding reported.  Confirmed with RN that lab is drawn appropriately (peripheral stick) and patient is not bleeding.   Goal of Therapy:  Heparin level 0.3-0.7 units/ml Monitor platelets by anticoagulation protocol: Yes    Plan:  Reduce  heparin gtt to 2950 units/hr Check 6 hr heparin level   Mahogany Torrance D. Laney Potash, PharmD, BCPS Pager:  [phone] 10/03/2017, 3:41 PM

## 2017-10-03 NOTE — Progress Notes (Signed)
RT note: 2 minutes into 1200 CPT, patient kept requesting that bed stop shaking.  When treatment was stopped noticed that patient had had a large bowel movement.  RN is aware.  Suctioned a small amount of thick, yellow secretions.  Will continue to monitor.

## 2017-10-03 NOTE — Progress Notes (Addendum)
PULMONARY / CRITICAL CARE MEDICINE   Name: Curtis Keller MRN: [id number] DOB: 11/17/1955    ADMISSION DATE:  09/20/2017 CONSULTATION DATE:  10/20/2017  REFERRING MD:  ED  CHIEF COMPLAINT:  Shortness of breath  Brief:   61 yr old M, SNF resident, with PMH of COPD, CHF EF 20%, CAD, apical mural LV thrombus   Admitted 11/28 with reports of altered mental status for 2 days. Found to have MRSA PNA. Intubated x 3 and ultimately trached on 12/7.   SUBJECTIVE:   Afebrile.  Was unable to tolerate pressure support ventilation yesterday.  Diuresed though remained net positive by I&O's.  Calm, no complaints today.  VITAL SIGNS: BP (!) 121/91   Pulse (!) 124   Temp 98.4 F (36.9 C) (Oral)   Resp 19   Ht 6' (1.829 m)   Wt 202 lb 6.1 oz (91.8 kg)   SpO2 91%   BMI 27.45 kg/m   HEMODYNAMICS: CVP:  [0 mmHg-7 mmHg] 0 mmHg  VENTILATOR SETTINGS: Vent Mode: PRVC FiO2 (%):  [40 %-50 %] 40 % Set Rate:  [16 bmp] 16 bmp Vt Set:  [409[620 mL] 620 mL PEEP:  [5 cmH20] 5 cmH20 Plateau Pressure:  [10 cmH20-20 cmH20] 20 cmH20  INTAKE / OUTPUT: I/O last 3 completed shifts: In: 4528.9 [I.V.:1528.9; NG/GT:2600; IV Piggyback:400] Out: 3525 [Urine:3525]  PHYSICAL EXAMINATION: General: Chronically ill elderly male, on vent  HEENT: [place]/AT, no JVD Neuro: Awake, alert, follows commands. CV: Tachy, regular, no MRG  PULM: Coarse bilateral breath sounds GI: Soft, non-tender, active bowel sounds  Extremities: No acute deformity, no edema.  Skin: Warm, dry, decub on sacral   LABS:  BMET Recent Labs  Lab 10/01/17 1745 10/02/17 0426 10/03/17 0512  NA 147* 148* 144  K 3.8 3.5 3.7  CL 114* 113* 111  CO2 26 28 27   BUN 66* 65* 60*  CREATININE 1.92* 1.82* 1.80*  GLUCOSE 139* 118* 119*   Electrolytes Recent Labs  Lab 10/01/17 0500 10/01/17 1745 10/02/17 0426 10/03/17 0512  CALCIUM 8.8* 8.6* 8.7* 8.7*  MG 2.0  --  1.9 2.0  PHOS 3.2  --  3.4 3.8   CBC Recent Labs  Lab 10/01/17 0500  10/02/17 0426 10/03/17 0512  WBC 21.6* 16.6* 10.9*  HGB 9.9* 10.0* 9.5*  HCT 31.2* 31.5* 30.6*  PLT 257 280 315   Coag's No results for input(s): APTT, INR in the last 168 hours.  Sepsis Markers Recent Labs  Lab 09/29/17 1031  LATICACIDVEN 2.0*   ABG Recent Labs  Lab 09/29/17 0402 09/29/17 0952  PHART 7.428 7.289*  PCO2ART 37.2 47.5  PO2ART 60.6* 136.0*   Liver Enzymes Recent Labs  Lab 09/27/17 0213  ALBUMIN 2.0*   Cardiac Enzymes Recent Labs  Lab 09/26/17 2325 09/27/17 0213 09/27/17 1336  TROPONINI 0.48* 0.46* 0.29*   Glucose Recent Labs  Lab 10/02/17 0400 10/02/17 1025 10/02/17 1412 10/02/17 1755 10/02/17 2033 10/03/17 0839  GLUCAP 118* 125* 107* 119* 111* 120*   Imaging Dg Chest Port 1 View  Result Date: 10/03/2017 CLINICAL DATA:  Shortness of Breath EXAM: PORTABLE CHEST 1 VIEW COMPARISON:  October 02, 2017 FINDINGS: Tracheostomy catheter tip is 6.2 cm above the carina. Central catheter tip is at the cavoatrial junction. No pneumothorax. There are moderate pleural effusions bilaterally with interstitial edema bilaterally. There is bibasilar atelectasis. There is cardiomegaly with pulmonary venous hypertension. No adenopathy. There is an old healed fracture of the right clavicle. IMPRESSION: Tube and catheter positions as described without  evident pneumothorax. Pleural effusions with edema persist. Cardiomegaly with pulmonary venous hypertension persists. Bibasilar atelectasis is stable. No new opacity evident. Electronically Signed   By: Bretta Bang III M.D.   On: 10/03/2017 07:04   Dg Chest Port 1 View  Result Date: 10/02/2017 CLINICAL DATA:  61 year old male with a history of repositioned PICC EXAM: PORTABLE CHEST 1 VIEW COMPARISON:  10/02/2017 FINDINGS: Cardiomediastinal silhouette unchanged. Fullness in the central vasculature. Mixed airspace interstitial opacities in the bilateral mid lungs and lower lungs with partial obscuration of the  heart borders and hemidiaphragm. Mild improvement in aeration compared to the prior plain film Unchanged tracheostomy. Interval reposition of right upper extremity PICC, now with the tip appearing to terminate superior vena cava. No displaced fractures. IMPRESSION: Interval repositioning of right upper extremity PICC, which now appears to terminate in the superior vena cava. Slight improvement in lung aeration, with persisting mixed interstitial and airspace opacities at the bilateral mid and lower lungs, likely with small pleural effusions. Unchanged tracheostomy. Electronically Signed   By: Gilmer Mor D.O.   On: 10/02/2017 09:38    CULTURES Sputum culture 11/28 > MRSA BCx2 11/28 > MRSA, C perfringens BCx2 11/30 > negative, final  ANTIBIOTICS: Zosyn 11/28 > 11/29 Vanc 11/28 >> Flagyl 11/30 >>   EVENTS 11/28  Admit 12/03  Extubated  12/04  Re-intubated (suspect medication related) 12/05  TEE negative for vegetation  STUDIES TEE 12/6 >> diffuse hypokinesis, LVEF 20-25%, may be a small calcified LV thrombus at the apex, no vegetation  LINES RUE PICC 12/4 >> ETT 12/2 > 12/3 ETT 12/4 > 12/6 ETT 12/7 > 12/7 Trach 12/7 Shiley 8 cuffed >>  ASSESSMENT / PLAN: 61 year old SNF resident with multiple medical issues admitted with MRSA and Clostridium bacteremia, & MRSA HCAP.  Acute hypoxemic respiratory failure in the setting of diffuse pulmonary infiltrates initially was PNA related now seems more volume related. MRSA pneumonia with ALI Left Pleural Effusion  Pulmonary Edema  Air leak trach P: Titrate O2 for sat of 88-92% PSV as tolerated (not tolerating thus far) Trach care per protocol  Diuresis per heart failure team.  ABX as outlined per ID, stop dates in place for 12/14  Follow intermittent CXR > Repeat in AM  Will need trach changed to XLT once track more mature and established. (roughly 12/14)   Septic Shock - volume responsive w/ Clostridium Perfringens and MRSA  bacteremia  Systolic HFrEF (20%). Repeat echo shows unchaged EF Elevated troponin, suspect demand ischemia he is not a candidate for intervention; this has trended down Tachycardia: multiple EKG's show frequent PVC, has had episodes of SVT and even non-sustained VT though less frequent on BB  P: Cardiology/Heart failure Following  Cardiac monitoring  KVO IVF Coreg increased to 6.25 mg BID 10/03/17  History of left ventricular thrombus, on chronic warfarin therapy Anemia  P: Heparin infusion per pharmacy Convert back to coumadin once off flagyl (12/14) Trend CBC  Acute on chronic kidney disease (improving) Mild hyperchloremia P: Sodium within normal limits, free water discontinued 12/11 Trend BMP/urinary output Replace electrolytes as indicated  Metabolic encephalopathy (improving); with history of stroke and left-sided hemiplegia since June 2018.  P: PT efforts  Hold home zanaflex > if restarted would reduce to lowest dose (oversedate with restart)  Valium 2mg  QD (home med) - mentating well on this no evidence of oversedation PRN fentanyl and versed     Constipation  Hx of dysphasia; has chronic PEG, tolerates soft diet at SNF Hx of  Hep C P: TF per Nutrition  Miralax daily  PEG care per protocol  Pepcid for SUP   DM P: CBG with SSI   Sacral Decubitus Ulcer - moisture associated  P: Frequent turning  WOC directing care Space boots for heel protection  Foam dressing to sacrum, heels  Hx Mechanical Falls with Prior Hip Fracture P: Monitor / fall precautions  PT efforts as able   FAMILY  - Updates:  Patient updated on plan of care 12/10. No family available  - Global:  ICU disposition.  Nearing SDU transfer.  [place] Jayron Maqueda, MD [facility] Pulmonology/Critical Care Pager [phone]   10/03/2017 9:10 AM

## 2017-10-03 NOTE — Progress Notes (Addendum)
CSW reached out to pt's sister Massie Bougie to speak with her about care options for pt. CSW left VM asking that she call CSW back. At this time, CSW continues to follow for discharge needs at this time.     Claude Manges Jamy Cleckler, MSW, LCSW-A Emergency Department Clinical Social Worker [phone]

## 2017-10-04 LAB — BASIC METABOLIC PANEL
Anion gap: 6 (ref 5–15)
BUN: 57 mg/dL — AB (ref 6–20)
CALCIUM: 8.7 mg/dL — AB (ref 8.9–10.3)
CHLORIDE: 112 mmol/L — AB (ref 101–111)
CO2: 26 mmol/L (ref 22–32)
CREATININE: 1.83 mg/dL — AB (ref 0.61–1.24)
GFR calc Af Amer: 44 mL/min — ABNORMAL LOW (ref >60)
GFR calc non Af Amer: 38 mL/min — ABNORMAL LOW (ref >60)
GLUCOSE: 104 mg/dL — AB (ref 65–99)
Potassium: 4.3 mmol/L (ref 3.5–5.1)
Sodium: 144 mmol/L (ref 135–145)

## 2017-10-04 LAB — GLUCOSE, CAPILLARY
GLUCOSE-CAPILLARY: 109 mg/dL — AB (ref 65–99)
Glucose-Capillary: 108 mg/dL — ABNORMAL HIGH (ref 65–99)
Glucose-Capillary: 101 mg/dL — ABNORMAL HIGH (ref 65–99)
Glucose-Capillary: 109 mg/dL — ABNORMAL HIGH (ref 65–99)
Glucose-Capillary: 118 mg/dL — ABNORMAL HIGH (ref 65–99)
Glucose-Capillary: 102 mg/dL — ABNORMAL HIGH (ref 65–99)

## 2017-10-04 LAB — CBC
HEMATOCRIT: 31.1 % — AB (ref 39.0–52.0)
Hemoglobin: 9.7 g/dL — ABNORMAL LOW (ref 13.0–17.0)
MCH: 30.5 pg (ref 26.0–34.0)
MCHC: 31.2 g/dL (ref 30.0–36.0)
MCV: 97.8 fL (ref 78.0–100.0)
PLATELETS: 321 10*3/uL (ref 150–400)
RBC: 3.18 MIL/uL — ABNORMAL LOW (ref 4.22–5.81)
RDW: 15.5 % (ref 11.5–15.5)
WBC: 9 10*3/uL (ref 4.0–10.5)

## 2017-10-04 LAB — MAGNESIUM: Magnesium: 1.9 mg/dL (ref 1.7–2.4)

## 2017-10-04 LAB — HEPARIN LEVEL (UNFRACTIONATED): HEPARIN UNFRACTIONATED: 0.67 [IU]/mL (ref 0.30–0.70)

## 2017-10-04 MED ORDER — CARVEDILOL 6.25 MG PO TABS
9.3750 mg | ORAL_TABLET | Freq: Two times a day (BID) | ORAL | Status: DC
  Administered 2017-10-04 – 2017-10-07 (×7): 9.375 mg
  Filled 2017-10-04 (×9): qty 1

## 2017-10-04 MED ORDER — FUROSEMIDE 80 MG PO TABS: 80.0000 mg | ORAL_TABLET | Freq: Every day | ORAL | Status: DC

## 2017-10-04 MED ORDER — SENNOSIDES 8.8 MG/5ML PO SYRP
5.0000 mL | ORAL_SOLUTION | Freq: Two times a day (BID) | ORAL | Status: DC
  Filled 2017-10-04 (×3): qty 5

## 2017-10-04 MED ORDER — DOCUSATE SODIUM 50 MG/5ML PO LIQD
100.0000 mg | Freq: Two times a day (BID) | ORAL | Status: DC
  Filled 2017-10-04 (×2): qty 10

## 2017-10-04 MED ORDER — FUROSEMIDE 80 MG PO TABS
80.0000 mg | ORAL_TABLET | Freq: Every day | ORAL | Status: DC
  Administered 2017-10-04: 80 mg via ORAL
  Filled 2017-10-04 (×2): qty 1

## 2017-10-04 NOTE — Progress Notes (Signed)
Physical Therapy Wound Treatment Patient Details  Name: Curtis Keller MRN: [id number] Date of Birth: 06/26/1956  Today's Date: 10/04/2017 Time: 5176-1607 Time Calculation (min): 33 min  Subjective  Subjective: trached, but alert Patient and Family Stated Goals: pt agreed to the hydrotherapy  Pain Score:  by nonverbal cuing, pain 2-4 with debridement, premedicated  Wound Assessment  Pressure Injury 09/20/17 Unstageable - Full thickness tissue loss in which the base of the ulcer is covered by slough (yellow, tan, gray, green or brown) and/or eschar (tan, brown or black) in the wound bed. previous DTI to bilat buttocks and sacrum has  (Active)  Dressing Type ABD;Barrier Film (skin prep);Gauze (Comment);Moist to dry 10/04/2017  4:13 PM  Dressing Clean;Dry;Intact 10/04/2017  4:13 PM  Dressing Change Frequency Daily 10/04/2017  4:13 PM  State of Healing Early/partial granulation 10/04/2017  4:13 PM  Site / Wound Assessment Yellow;Pink 10/04/2017  4:13 PM  % Wound base Red or Granulating 25% 10/04/2017  4:13 PM  % Wound base Yellow/Fibrinous Exudate 75% 10/04/2017  4:13 PM  % Wound base Black/Eschar 0% 10/04/2017  4:13 PM  % Wound base Other/Granulation Tissue (Comment) 0% 10/04/2017  4:13 PM  Peri-wound Assessment Erythema (blanchable) 10/04/2017  4:13 PM  Wound Length (cm) 9 cm 10/04/2017  4:13 PM  Wound Width (cm) 7 cm 10/04/2017  4:13 PM  Wound Depth (cm) 0 cm 10/04/2017  4:13 PM  Wound Surface Area (cm^2) 63 cm^2 10/04/2017  4:13 PM  Wound Volume (cm^3) 0 cm^3 10/04/2017  4:13 PM  Margins Unattached edges (unapproximated) 10/04/2017  4:13 PM  Drainage Amount Minimal 10/04/2017  4:13 PM  Drainage Description Serous 10/04/2017  4:13 PM  Treatment Cleansed;Debridement (Selective);Hydrotherapy (Pulse lavage);Packing (Saline gauze) 10/04/2017  4:13 PM   Santyl applied to wound bed prior to applying dressing.    Hydrotherapy Pulsed lavage therapy - wound location: sacral Pulsed  Lavage with Suction (psi): 8 psi Pulsed Lavage with Suction - Normal Saline Used: 1000 mL Pulsed Lavage Tip: Tip with splash shield Selective Debridement Selective Debridement - Location: sacral Selective Debridement - Tools Used: Forceps;Scalpel Selective Debridement - Tissue Removed: lightly adhered slough/yellow eschar   Wound Assessment and Plan  Wound Therapy - Assess/Plan/Recommendations Wound Therapy - Clinical Statement: pt's wound will benefit from hydro therapy for PLS to decrease bio-burdin and soften eschar for debridement.  At this point expect a large portion to be superficial, but the middle is unstageable. Wound Therapy - Functional Problem List: lack of mobility on trach at present Factors Delaying/Impairing Wound Healing: Immobility;Multiple medical problems Hydrotherapy Plan: Debridement;Dressing change;Pulsatile lavage with suction Wound Therapy - Frequency: 6X / week Wound Therapy - Current Recommendations: PT Wound Therapy - Follow Up Recommendations: Skilled nursing facility Wound Plan: see above  Wound Therapy Goals- Improve the function of patient's integumentary system by progressing the wound(s) through the phases of wound healing (inflammation - proliferation - remodeling) by: Decrease Necrotic Tissue to: 20% Decrease Necrotic Tissue - Progress: Goal set today Increase Granulation Tissue to: 80% Increase Granulation Tissue - Progress: Goal set today Goals/treatment plan/discharge plan were made with and agreed upon by patient/family: Yes Time For Goal Achievement: 7 days Wound Therapy - Potential for Goals: Good  Goals will be updated until maximal potential achieved or discharge criteria met.  Discharge criteria: when goals achieved, discharge from hospital, MD decision/surgical intervention, no progress towards goals, refusal/missing three consecutive treatments without notification or medical reason.  GP     Tessie Fass Allaya Abbasi 10/04/2017, 4:30  PM 10/04/2017  Donnella Sham, PT [phone]  (pager)

## 2017-10-04 NOTE — Progress Notes (Addendum)
ANTICOAGULATION CONSULT NOTE Pharmacy Consult:  Heparin Indication:  History of atrial thrombus  Allergies  Allergen Reactions  . Codeine Anaphylaxis and Nausea And Vomiting  . Lisinopril Itching and Rash  . Hydrocodone Other (See Comments)    On MAR  . Hydrocodone-Acetaminophen Nausea And Vomiting    Cannot take any acetaminophen due to liver issues per sister  . Tegaderm Ag Mesh [Silver] Other (See Comments)    Allergy to "silver compounds" listed on MAR  . Tylenol [Acetaminophen] Nausea Only and Other (See Comments)    Cannot take any acetaminophen due to liver issues per sister    Patient Measurements: Height: 6' (182.9 cm) Weight: 209 lb 14.1 oz (95.2 kg) IBW/kg (Calculated) : 77.6  Heparin Dosing weight: 91.8 kg  Vital Signs: Temp: 98.6 F (37 C) (12/12 0700) Temp Source: Oral (12/12 0700) BP: 106/77 (12/12 0919) Pulse Rate: 71 (12/12 0919)  Labs: Recent Labs    10/01/17 1745  10/02/17 0426  10/03/17 0512 10/03/17 1410 10/03/17 2247 10/04/17 0429  HGB  --    < > 10.0*  --  9.5*  --   --  9.7*  HCT  --   --  31.5*  --  30.6*  --   --  31.1*  PLT  --   --  280  --  315  --   --  321  HEPARINUNFRC  --    < > 0.23*   < > 0.70 0.81* 0.67 0.67  CREATININE 1.92*  --  1.82*  --  1.80*  --   --   --    < > = values in this interval not displayed.    Estimated Creatinine Clearance: 50.9 mL/min (A) (by C-G formula based on SCr of 1.8 mg/dL (H)).  Assessment: 61 y.o. male with h/o atrial thrombus, Coumadin on hold, for heparin.  Heparin level 0.67  on 2950 units/hr.  Hgb 9.7, Platelets 321.  No bleeding reported.   Has C. Perfringens and MRSA bacteremia on Vanocmycin until 12/14 and Flagyl until 12/13. WBC is down to normal. Patient remains afebrile. TEE was negative for vegetation. SCr 1.8 -stable. CrCl ~ 45-50 mL/min.   Goal of Therapy:  Heparin level 0.3-0.7 units/ml Monitor platelets by anticoagulation protocol: Yes  Plan:  Continue Heparin to 2950  units/hr Daily Heparin level and CBC while on therapy.   Continue Vancomycin 500 mg IV every 24 hours.  Continue Metronidazole 500 mg IV every 8 hours.  Stop dates on orders.   Link Snuffer, PharmD, BCPS, BCCCP Clinical Pharmacist Clinical phone 10/04/2017 until 3:30PM [phone] After hours, please call [phone] 10/04/2017, 10:58 AM

## 2017-10-04 NOTE — Progress Notes (Signed)
Nutrition Follow-up  DOCUMENTATION CODES:   Not applicable  INTERVENTION:   Continue:   Vital AF 1.2 at 65 ml/h (1560 ml per day)  Pro-stat 30 ml once daily  Provides 1972 kcal, 132 gm protein, 1265 ml free water daily  Liquid MVI daily  NUTRITION DIAGNOSIS:   Inadequate oral intake related to inability to eat as evidenced by NPO status.  Ongoing   GOAL:   Patient will meet greater than or equal to 90% of their needs   Met with TF  MONITOR:   Vent status, TF tolerance, Labs, I & O's, Skin  ASSESSMENT:   61 yo male with PMH of COPD, HTN, malnutrition, dysphagia, PEG, dementia, L hemiplegia, CHF, ischemia cardiomyopathy, CAD, HLD, on home oxygen, DM-2, hepatitis C, stroke who was admitted on 11/28 from SNF with acute hypoxic respiratory failure, diffuse pulmonary infiltrates, requiring intubation in the ED.  Discussed patient in ICU rounds and with RN today. S/P trach 12/7. Patient is currently receiving Vital AF 1.2 via PEG at 65 ml/h (1560 ml/day) with Prostat 30 ml once daily to provide 1972 kcals, 132 gm protein, 1265 ml free water daily.  Patient remains on ventilator support MV: 9.1 L/min Temp (24hrs), Avg:98.8 F (37.1 C), Min:98.4 F (36.9 C), Max:99.2 F (37.3 C)  Labs reviewed. CBG's: 101-109-118 Medications reviewed and include Colace, Lasix, MVI, Miralax, and Senokot.  Diet Order:  Diet NPO time specified  EDUCATION NEEDS:   No education needs have been identified at this time  Skin:  Skin Assessment: Skin Integrity Issues: Skin Integrity Issues:: Unstageable, DTI, Stage II DTI: heels Stage I: N/A Stage II: L heel Unstageable: coccyx  Last BM:  12/8  Height:   Ht Readings from Last 1 Encounters:  09/27/17 6' (1.829 m)    Weight:   Wt Readings from Last 1 Encounters:  10/04/17 209 lb 14.1 oz (95.2 kg)    Ideal Body Weight:  80.9 kg  BMI:  Body mass index is 28.46 kg/m.  Estimated Nutritional Needs:   Kcal:   1960  Protein:  120-140 gm  Fluid:  2 L    , RD, LDN, CNSC Pager [phone] After Hours Pager [phone]  

## 2017-10-04 NOTE — Progress Notes (Signed)
PULMONARY / CRITICAL CARE MEDICINE   Name: Curtis Keller MRN: [id number] DOB: 1956-07-13    ADMISSION DATE:  09/20/2017 CONSULTATION DATE:  10/20/2017  REFERRING MD:  ED  CHIEF COMPLAINT:  Shortness of breath  Brief:   61 yr old M, SNF resident, with PMH of COPD, CHF EF 20%, CAD, apical mural thrombus   Admitted 11/28 with reports of altered mental status for 2 days. Found to have MRSA PNA. Intubated x 3 and ultimately trached on 12/7.   SUBJECTIVE:   No events overnight, no new complaints, diarrhea  VITAL SIGNS: BP 103/75   Pulse 65   Temp 99.2 F (37.3 C) (Oral)   Resp 16   Ht 6' (1.829 m)   Wt 95.2 kg (209 lb 14.1 oz)   SpO2 93%   BMI 28.46 kg/m   HEMODYNAMICS: CVP:  [6 mmHg-18 mmHg] 12 mmHg  VENTILATOR SETTINGS: Vent Mode: PRVC FiO2 (%):  [40 %] 40 % Set Rate:  [16 bmp] 16 bmp Vt Set:  [620 mL] 620 mL PEEP:  [5 cmH20] 5 cmH20 Plateau Pressure:  [15 cmH20-21 cmH20] 19 cmH20  INTAKE / OUTPUT: I/O last 3 completed shifts: In: 5179.7 [I.V.:1674.7; Other:465; NG/GT:2040; IV Piggyback:1000] Out: 2750 [Urine:2750]  PHYSICAL EXAMINATION: General: Chronically ill appearing male on the vent, NAD HEENT: [place]/AT, PERRL, EOM-I and MMM Neuro: Awake and interactive, moving all ext to command PULM: Coarse BS diffusely GI: Soft, NT, ND and +BS Extremities: -edema and -tenderness Skin: Decub noted  LABS:  BMET Recent Labs  Lab 10/01/17 1745 10/02/17 0426 10/03/17 0512  NA 147* 148* 144  K 3.8 3.5 3.7  CL 114* 113* 111  CO2 26 28 27   BUN 66* 65* 60*  CREATININE 1.92* 1.82* 1.80*  GLUCOSE 139* 118* 119*   Electrolytes Recent Labs  Lab 10/01/17 0500 10/01/17 1745 10/02/17 0426 10/03/17 0512  CALCIUM 8.8* 8.6* 8.7* 8.7*  MG 2.0  --  1.9 2.0  PHOS 3.2  --  3.4 3.8   CBC Recent Labs  Lab 10/02/17 0426 10/03/17 0512 10/04/17 0429  WBC 16.6* 10.9* 9.0  HGB 10.0* 9.5* 9.7*  HCT 31.5* 30.6* 31.1*  PLT 280 315 321   Coag's No results for  input(s): APTT, INR in the last 168 hours.  Sepsis Markers Recent Labs  Lab 09/29/17 1031  LATICACIDVEN 2.0*   ABG Recent Labs  Lab 09/29/17 0402 09/29/17 0952  PHART 7.428 7.289*  PCO2ART 37.2 47.5  PO2ART 60.6* 136.0*   Liver Enzymes No results for input(s): AST, ALT, ALKPHOS, BILITOT, ALBUMIN in the last 168 hours. Cardiac Enzymes Recent Labs  Lab 09/27/17 1336  TROPONINI 0.29*   Glucose Recent Labs  Lab 10/03/17 0839 10/03/17 1208 10/03/17 1552 10/03/17 2054 10/04/17 0001 10/04/17 0413  GLUCAP 120* 103* 115* 124* 118* 101*   Imaging No results found.  CULTURES Sputum culture 11/28 > MRSA BCx2 11/28 > MRSA, C perfringens BCx2 11/30 > negative  ANTIBIOTICS: Zosyn 11/28 > 11/29 Vanc 11/28 >> Flagyl 11/30 >>   EVENTS 11/28  Admit 12/03  Extubated  12/04  Re-intubated (suspect medication related) 12/05  TEE   STUDIES TEE 12/6 >> diffuse hypokinesis, LVEF 20-25%, may be a small calcified LV thrombus at the apex, no vegetation  LINES RUE PICC 12/4 >> ETT 12/2 > 12/3 ETT 12/4 > 12/6 ETT 12/7 > 12/7 Trach 12/7 Shiley 8 cuffed >>  I reviewed CXR myself, trach is in good position  ASSESSMENT / PLAN: 61 year old SNF resident with multiple  medical issues admitted with MRSA and Clostridium bacteremia, & MRSA HCAP.  Acute hypoxemic respiratory failure in the setting of diffuse pulmonary infiltrates initially was PNA related now seems more volume related. MRSA pneumonia with ALI Left Pleural Effusion  Pulmonary Edema  Air leak trach P: Titrate O2 for sat of 88-92% Failed PS trials earlier, attempt again today Trach care per protocol  Diureses per heart failure team.  ABX as outlined per ID, stop dates in place for 12/14  Follow intermittent CXR > Repeat in AM  Will need trach changed to XLT once track more mature and established. (change in AM)  Septic Shock - volume responsive w/ Clostridium Perfringens and MRSA bacteremia  Systolic HFrEF  (16%20%). Repeat echo shows unchaged EF Elevated troponin, suspect demand ischemia he is not a candidate for intervention; this has trended down  P: Cardiology/Heart failure Following  Cardiac monitoring  KVO IVF Continue coreg  History of left ventricular thrombus, on chronic warfarin therapy Anemia  P: Heparin infusion per pharmacy Convert back to coumadin once off flagyl Trend CBC  Acute on chronic kidney disease (improving) Mild hyperchloremia P: Free water 250 ml q6 Trend BMP/urinary output Replace electrolytes as indicated  Metabolic encephalopathy (improving); with history of stroke and left-sided hemiplegia since June 2018.  P: PT efforts  Hold home zanaflex > if restarted would reduce to lowest dose (oversedate with restart)  Valium 2mg  QD (home med)  PRN fentanyl and versed     Constipation  Hx of dysphasia; has chronic PEG, tolerates soft diet at SNF Hx of Hep C P: TF per Nutrition  Miralax daily  PEG care per protocol Pepcid for SUP   DM P: CBG with SSI   Sacral Decubitus - moisture associated  P: Frequent turning  WOC directing care Space boots for heel protection  Foam dressing to sacrum, heels  Hx Mechanical Falls with Prior Hip Fracture P: Monitor / fall precautions  PT efforts as able   FAMILY  - Updates:  Patient updated 12/12, needs placement  - Global:  [facility] with [facility] following for trach/vent management  Discussed with PCCM-NP and bedside RN.  Alyson ReedyWesam G. Yacoub, M.D. [facility] Pulmonary/Critical Care Medicine. Pager: [phone]. After hours pager: [phone]  10/04/2017 9:18 AM

## 2017-10-04 NOTE — Care Management Note (Signed)
Case Management Note  Patient Details  Name: Curtis Keller MRN: [id number] Date of Birth: 30-Dec-1955  Subjective/Objective:    Pt admitted with SOB              Action/Plan:   PTA from [facility].  Pt has extensive hx including trach (decannulated), PEG (PTA PEG was not in use as pt was tolerating PO diet).  Pts sister is bed side and has requested that pt not return to [facility] -  Pt and sister would like CSW assistance with consideration for facilities closer to the [place] area.  Pt will need IV antibiotics for approximately 2 weeks - CSW informed.  CSW consulted and actively working on placement.     Expected Discharge Date:  09/27/17               Expected Discharge Plan:  Skilled Nursing Facility(From facility)  In-House Referral:  Clinical Social Work  Discharge planning Services  CM Consult  Post Acute Care Choice:    Choice offered to:     DME Arranged:    DME Agency:     HH Arranged:    HH Agency:     Status of Service:     If discussed at [employer], dates discussed:    Additional Comments: 10/04/2017  Discussed in LOS 12/11 - pt remains appropriate for continued stay.  Pt remains on ventilator via trach. CM confirmed that pt does not have any LTACH days for this calendar year.  Discharge plan continues to be SNF with a slow wean (probable vent SNF).  Pt currently on Heparin drip requiring increase in PO lasix dose  09/29/17 Pt received trach today post 2 failed extubations (pt has a hx of two previous trachs however was decannulated with each) . Plan is to begin weaning post trach.  CM will continue to follow for discharge needs to determine appropriate discharge venue.      Cherylann Parr, RN 10/04/2017, 1:56 PM

## 2017-10-04 NOTE — Progress Notes (Signed)
Patient ID: Curtis Keller, male   DOB: [birth year], 61 y.o.   MRN: [id number]      Advanced Heart Failure Rounding Note   Subjective:    Patient was extubated 12/6. Respiratory distress on 12/7 and re-intubated.  He then had tracheostomy placed.    Yesterday coreg increased to 6.25 mg twice a day and switched to po lasix 40 mg daily. Had > 30 beats NSVT.    Denies SOB.    Objective:   Weight Range: 209 lb 14.1 oz (95.2 kg) Body mass index is 28.46 kg/m.   Vital Signs:   Temp:  [98.2 F (36.8 C)-99.2 F (37.3 C)] 98.6 F (37 C) (12/12 0700) Pulse Rate:  [62-118] 71 (12/12 0919) Resp:  [12-21] 16 (12/12 0919) BP: (97-133)/(68-93) 106/77 (12/12 0919) SpO2:  [90 %-99 %] 95 % (12/12 0919) FiO2 (%):  [40 %] 40 % (12/12 0919) Weight:  [209 lb 14.1 oz (95.2 kg)] 209 lb 14.1 oz (95.2 kg) (12/12 0431) Last BM Date: 09/30/17  Weight change: Filed Weights   10/02/17 0159 10/03/17 0500 10/04/17 0431  Weight: 202 lb 6.1 oz (91.8 kg) 202 lb 6.1 oz (91.8 kg) 209 lb 14.1 oz (95.2 kg)    Intake/Output:   Intake/Output Summary (Last 24 hours) at 10/04/2017 0944 Last data filed at 10/04/2017 0500 Gross per 24 hour  Intake 3197.17 ml  Output 1615 ml  Net 1582.17 ml      Physical Exam  CVp 8-9  General: No resp difficulty HEENT: normal except Trach.  Neck: supple. JVP 10-11.  Carotids 2+ bilat; no bruits. No lymphadenopathy or thryomegaly appreciated. Cor: PMI nondisplaced. Regular rate & rhythm. No rubs, gallops or murmurs. Lungs: coarse throughout.  Abdomen: soft, nontender, nondistended. No hepatosplenomegaly. No bruits or masses. Good bowel sounds. Extremities: no cyanosis, clubbing, rash, R and LLE SCDs. No edema.  Neuro: Trach MAE Follows commands.   Telemetry   NSR with PVCs.  80s. On 12/11 had > 30 beats NSVT.   Labs    CBC Recent Labs    10/03/17 0512 10/04/17 0429  WBC 10.9* 9.0  HGB 9.5* 9.7*  HCT 30.6* 31.1*  MCV 97.1 97.8  PLT 315 563   Basic  Metabolic Panel Recent Labs    10/02/17 0426 10/03/17 0512  NA 148* 144  K 3.5 3.7  CL 113* 111  CO2 28 27  GLUCOSE 118* 119*  BUN 65* 60*  CREATININE 1.82* 1.80*  CALCIUM 8.7* 8.7*  MG 1.9 2.0  PHOS 3.4 3.8   Liver Function Tests No results for input(s): AST, ALT, ALKPHOS, BILITOT, PROT, ALBUMIN in the last 72 hours. No results for input(s): LIPASE, AMYLASE in the last 72 hours. Cardiac Enzymes No results for input(s): CKTOTAL, CKMB, CKMBINDEX, TROPONINI in the last 72 hours.  BNP: BNP (last 3 results) Recent Labs    03/17/17 1438 03/30/17 0629 09/20/17 0545  BNP 601.2* 199.5* 1,771.9*    ProBNP (last 3 results) No results for input(s): PROBNP in the last 8760 hours.   D-Dimer No results for input(s): DDIMER in the last 72 hours. Hemoglobin A1C No results for input(s): HGBA1C in the last 72 hours. Fasting Lipid Panel No results for input(s): CHOL, HDL, LDLCALC, TRIG, CHOLHDL, LDLDIRECT in the last 72 hours. Thyroid Function Tests No results for input(s): TSH, T4TOTAL, T3FREE, THYROIDAB in the last 72 hours.  Invalid input(s): FREET3  Other results:   Imaging    No results found.   Medications:  Scheduled Medications: . acetylcysteine  2 mL Nebulization BID  . albuterol  2.5 mg Nebulization Q6H  . atorvastatin  40 mg Per Tube QHS  . carvedilol  6.25 mg Per Tube BID WC  . chlorhexidine gluconate (MEDLINE KIT)  15 mL Mouth Rinse BID  . Chlorhexidine Gluconate Cloth  6 each Topical Daily  . collagenase   Topical Daily  . diazepam  2 mg Oral Daily  . famotidine  20 mg Per Tube Daily  . feeding supplement (PRO-STAT SUGAR FREE 64)  30 mL Per Tube Daily  . furosemide  40 mg Oral Daily  . insulin aspart  0-15 Units Subcutaneous Q4H  . mouth rinse  15 mL Mouth Rinse QID  . multivitamin  15 mL Per Tube Daily  . polyethylene glycol  17 g Oral Daily  . sodium chloride flush  10-40 mL Intracatheter Q12H    Infusions: . sodium chloride 250 mL  (10/03/17 2000)  . feeding supplement (VITAL AF 1.2 CAL) 1,000 mL (10/03/17 1246)  . heparin 2,950 Units/hr (10/04/17 0940)  . metronidazole Stopped (10/04/17 0332)  . vancomycin Stopped (10/04/17 0755)    PRN Medications: sodium chloride, bisacodyl, fentaNYL (SUBLIMAZE) injection, metoprolol tartrate, midazolam, sodium chloride flush    Patient Profile   Curtis Keller is a 61 y.o. male with history of COPD, Systolic CHF EF 01%, CAD, hx of CVA with left sided weakness, h/o of prolonged vent wean s/p trach, peg s/p decannulation, h/o Hepatitis C, as well as known apical mural thrombus admitted with worsening SOB and AMS  Found to have MRSA PNA and C. Perfringens + MRSA bacteremia.   Assessment/Plan   1. Septic shock: Hypotensive initially, MRSA PNA with MRSA and C perfringens in blood.  He is now off pressors. Stable today.   2. Acute hypoxemic respiratory failure: MRSA PNA.  He now has a tracheostomy. Slow wean planned.   3. Acute on chronic systolic CHF: History of ischemic cardiomyopathy.  TEE this admission with EF 20-25%. Check CO-OX now. - Volume status trending up. CVP up 4>8-9 - Increase lasix to 80 mg daily.Consider spiro tomorrow.  -Increase coreg to 9.375 mg twice a day.   - No spiro/arb with elevated creatinine.  3. PVCs/short episodes of NSVT.  - 12/11 30 beats NSVT   - Check BMET and mag now.   - Not thought to be ICD candidate given baseline debilitation (left hemiparesis, PEG, living in SNF).  4. ID: No endocarditis on TEE.  Had MRSA PNA with MRSA and C perfringens in blood. On vancomycin and Flagyl.  5. CAD: Has history of BMS to RCA in 2016.  Troponin to 2.6 this admission, could be demand ischemia from septic shock.   - Continue statin.  - No ASA with full anticoagulation.  - Consider eventual cath but would need to see improved renal function.  6. AKI on CKD: Creatinine unchanged 1.8.    7. LV thrombus: Chronic warfarin, TEE appeared to show a small  calcified LV thrombus.   - Currently on heparin gtt, will need eventual conversion back to warfarin.   Length of Stay: [place], NP  10/04/2017, 9:44 AM  Advanced Heart Failure Team Pager [phone] (M-F; 7a - 4p)  Please contact [facility] for night-coverage after hours (4p -7a ) and weekends on [email or web address]  Patient seen with NP, agree with the above note. Run of NSVT yesterday, none so far today.  CVP 8-9.    Would increase Lasix to 80  mg po daily. If creatinine stable tomorrow, will add spironolactone 12.5 daily.   Increase Coreg to 9.375 mg bid.   Eventually will need to transition from heparin gtt to warfarin.   Loralie Champagne 10/04/2017 12:25 PM

## 2017-10-04 NOTE — Progress Notes (Signed)
CSW received permission to fax pt out to out of state facilities. CSW has faxed pt out to vent/snf facilities at this time. CSW will continue to follow fr discharge needs.     Claude Manges Kareen Jefferys, MSW, LCSW-A Emergency Department Clinical Social Worker [phone]

## 2017-10-04 NOTE — Progress Notes (Signed)
Patient's 0800 Chest PT held at this time. Pt unavailable for treatment. Being seen by SLP for swallow evaluation and now being seen by nurse. Will perform treatment when patient is available

## 2017-10-04 NOTE — NC FL2 (Signed)
[place] LEVEL OF CARE SCREENING TOOL     IDENTIFICATION  Patient Name: Curtis Keller Birthdate: 1956/09/24 Sex: male Admission Date (Current Location): 09/20/2017  [facility] and [place] Number:  Herbalist and Address:  The [place]. [facility], [place] [address], [place],  [address]      Provider Number: [id number]  Attending Physician Name and Address:  Allie Bossier, MD  Relative Name and Phone Number:  Lavell Islam [phone]    Current Level of Care: Hospital Recommended Level of Care: Vent SNF Prior Approval Number:    Date Approved/Denied:   PASRR Number: [id number]  Discharge Plan: Other (Comment)(vent snf.)    Current Diagnoses: Patient Active Problem List   Diagnosis Date Noted  . Tracheostomy status ([place])   . Clostridium perfringens bacteremia  09/28/2017  . Acute encephalopathy   . MRSA bacteremia   . Acute hypoxemic respiratory failure ([place]) 09/20/2017  . Lactic acidosis   . Acute pulmonary edema (HCC)   . Recurrent aspiration pneumonia ([place]) 05/06/2017  . Tracheostomy complication ([place]) 20/25/4270  . Complication of tracheostomy tube ([place])   . Sepsis ([place]) 04/07/2017  . Acute respiratory failure with hypoxia ([place])   . SOB (shortness of breath)   . Hypoxia   . Pressure ulcer 03/20/2017  . HCAP (healthcare-associated pneumonia) 03/18/2017  . Pneumonia 03/17/2017  . 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 Depression with anxiety 01/23/2017  . Encounter for hospice care discussion   . DNR (do not resuscitate)   . Ischemic cardiomyopathy   . Goals of care, counseling/discussion   . Palliative care encounter   . Acute systolic (congestive) heart failure ([place])   . Acute and chronic respiratory failure with hypoxia ([place]) 11/28/2016  . Hip fracture ([place]) 11/27/2016  . Closed nondisplaced intertrochanteric fracture of left femur ([place]) 11/27/2016  . Chronic respiratory failure  ([place]) 11/27/2016  . Fall at nursing home 07/17/2016  . Diabetes mellitus, type II, insulin dependent ([place]) 06/12/2016  . GERD (gastroesophageal reflux disease) 05/07/2016  . COPD (chronic obstructive pulmonary disease) ([place]) 04/18/2016  . Chronic anticoagulation 03/22/2016  . S/P splenectomy 03/13/2016  . PEG (percutaneous endoscopic gastrostomy) status ([place]) 03/13/2016  . MRSA (methicillin resistant Staphylococcus aureus) septicemia ([place]) 03/13/2016  . Aspiration pneumonia ([place]) 03/13/2016  . Dysphagia 03/13/2016  . Acute blood loss anemia 03/13/2016  . Hyperlipidemia 03/13/2016  . Chronic systolic CHF (congestive heart failure) ([place])   . Hypertension   . Depression   . Acute respiratory failure ([place])   . Cardiomyopathy ([place])   . Apical mural thrombus   . Protein calorie malnutrition ([place])   . Intraparenchymal hematoma of brain due to trauma [facility])     Orientation RESPIRATION BLADDER Height & Weight     (pt intubated at this time. )  Vent Continent Weight: 209 lb 14.1 oz (95.2 kg) Height:  6' (182.9 cm)  BEHAVIORAL SYMPTOMS/MOOD NEUROLOGICAL BOWEL NUTRITION STATUS      Incontinent Diet(please see discharge summary. )  AMBULATORY STATUS COMMUNICATION OF NEEDS Skin   Total Care Non-Verbally Skin abrasions(pressure ulcer. )                       Personal Care Assistance Level of Assistance  Total care Bathing Assistance: Maximum assistance Feeding assistance: Maximum assistance Dressing Assistance: Maximum assistance Total Care Assistance: Maximum assistance   Functional Limitations Info  Hearing, Sight, Speech Sight Info: Adequate Hearing Info: Adequate Speech  Info: Impaired(pt is intubated. )    SPECIAL CARE FACTORS FREQUENCY  PT (By licensed PT)     PT Frequency: 5 times a week  OT Frequency: 5 times a week             Contractures Contractures Info: Not present    Additional Factors Info  Code Status, Allergies Code Status Info: Full  Allergies  Info: Codeine, Lisinopril, Hydrocodone, Hydrocodone-acetaminophen, Tegaderm Ag Mesh Silver, Tylenol Acetaminophen   Insulin Sliding Scale Info: Every 4 hours Isolation Precautions Info: MRSA     Current Medications (10/04/2017):  This is the current hospital active medication list Current Facility-Administered Medications  Medication Dose Route Frequency Provider Last Rate Last Dose  . 0.9 %  sodium chloride infusion  250 mL Intravenous PRN Aldean Jewett, MD 10 mL/hr at 10/03/17 2000 250 mL at 10/03/17 2000  . acetylcysteine (MUCOMYST) 20 % nebulizer / oral solution 2 mL  2 mL Nebulization BID Kloefkorn, [place], MD   2 mL at 10/04/17 0853  . albuterol (PROVENTIL) (2.5 MG/3ML) 0.083% nebulizer solution 2.5 mg  2.5 mg Nebulization Q6H Omar Person, NP   2.5 mg at 10/04/17 0855  . atorvastatin (LIPITOR) tablet 40 mg  40 mg Per Tube QHS Ollis, Brandi L, NP   40 mg at 10/03/17 2245  . bisacodyl (DULCOLAX) suppository 10 mg  10 mg Rectal Daily PRN Omar Person, NP      . carvedilol (COREG) tablet 9.375 mg  9.375 mg Per Tube BID WC Clegg, Amy D, NP      . chlorhexidine gluconate (MEDLINE KIT) (PERIDEX) 0.12 % solution 15 mL  15 mL Mouth Rinse BID Mannam, Praveen, MD   15 mL at 10/04/17 0754  . Chlorhexidine Gluconate Cloth 2 % PADS 6 each  6 each Topical Daily Mannam, Praveen, MD   6 each at 10/04/17 1044  . collagenase (SANTYL) ointment   Topical Daily Rush Farmer, MD      . diazepam (VALIUM) tablet 2 mg  2 mg Oral Daily Ollis, Brandi L, NP   2 mg at 10/04/17 1040  . famotidine (PEPCID) 40 MG/5ML suspension 20 mg  20 mg Per Tube Daily Sloan Leiter B, RPH   20 mg at 10/04/17 1041  . feeding supplement (PRO-STAT SUGAR FREE 64) liquid 30 mL  30 mL Per Tube Daily Rush Farmer, MD   30 mL at 10/04/17 1041  . feeding supplement (VITAL AF 1.2 CAL) liquid 1,000 mL  1,000 mL Per Tube Continuous Rush Farmer, MD 60 mL/hr at 10/04/17 1046 1,000 mL at 10/04/17 1046  . fentaNYL  (SUBLIMAZE) injection 100 mcg  100 mcg Intravenous Q2H PRN Aldean Jewett, MD   100 mcg at 10/04/17 0501  . furosemide (LASIX) tablet 80 mg  80 mg Oral Daily Larey Dresser, MD      . heparin ADULT infusion 100 units/mL (25000 units/267m sodium chloride 0.45%)  2,950 Units/hr Intravenous Continuous DJohnnette GourdD, RPH 29.5 mL/hr at 10/04/17 0940 2,950 Units/hr at 10/04/17 0940  . insulin aspart (novoLOG) injection 0-15 Units  0-15 Units Subcutaneous Q4H BErick Colace NP   2 Units at 10/03/17 2100  . MEDLINE mouth rinse  15 mL Mouth Rinse QID Mannam, Praveen, MD   15 mL at 10/04/17 0431  . metoprolol tartrate (LOPRESSOR) injection 5 mg  5 mg Intravenous Q6H PRN EOmar Person NP   5 mg at 10/02/17 2039  . metroNIDAZOLE (FLAGYL) IVPB 500 mg  500 mg Intravenous Q8H Campbell Riches, MD 100 mL/hr at 10/04/17 1041 500 mg at 10/04/17 1041  . midazolam (VERSED) injection 1-4 mg  1-4 mg Intravenous Q1H PRN Javier Glazier, MD   2 mg at 10/01/17 2214  . multivitamin liquid 15 mL  15 mL Per Tube Daily Rush Farmer, MD   15 mL at 10/04/17 1041  . polyethylene glycol (MIRALAX / GLYCOLAX) packet 17 g  17 g Oral Daily Omar Person, NP   17 g at 10/04/17 1040  . sodium chloride flush (NS) 0.9 % injection 10-40 mL  10-40 mL Intracatheter Q12H Mannam, Praveen, MD   10 mL at 10/04/17 1042  . sodium chloride flush (NS) 0.9 % injection 10-40 mL  10-40 mL Intracatheter PRN Mannam, Praveen, MD      . vancomycin (VANCOCIN) 500 mg in sodium chloride 0.9 % 100 mL IVPB  500 mg Intravenous Q24H Erenest Blank, [facility]   Stopped at 10/04/17 2182     Discharge Medications: Please see discharge summary for a list of discharge medications.  Relevant Imaging Results:  Relevant Lab Results:   Additional Information SSN- [id number]  Wetzel Bjornstad, LCSWA

## 2017-10-05 DIAGNOSIS — I236 Thrombosis of atrium, auricular appendage, and ventricle as current complications following acute myocardial infarction: Secondary | ICD-10-CM

## 2017-10-05 DIAGNOSIS — N179 Acute kidney failure, unspecified: Secondary | ICD-10-CM

## 2017-10-05 DIAGNOSIS — R6521 Severe sepsis with septic shock: Secondary | ICD-10-CM

## 2017-10-05 DIAGNOSIS — L89152 Pressure ulcer of sacral region, stage 2: Secondary | ICD-10-CM

## 2017-10-05 DIAGNOSIS — A419 Sepsis, unspecified organism: Principal | ICD-10-CM

## 2017-10-05 DIAGNOSIS — R0689 Other abnormalities of breathing: Secondary | ICD-10-CM | POA: Diagnosis present

## 2017-10-05 DIAGNOSIS — J90 Pleural effusion, not elsewhere classified: Secondary | ICD-10-CM

## 2017-10-05 DIAGNOSIS — J9621 Acute and chronic respiratory failure with hypoxia: Secondary | ICD-10-CM | POA: Diagnosis present

## 2017-10-05 DIAGNOSIS — G9341 Metabolic encephalopathy: Secondary | ICD-10-CM

## 2017-10-05 LAB — GLUCOSE, CAPILLARY
GLUCOSE-CAPILLARY: 111 mg/dL — AB (ref 65–99)
GLUCOSE-CAPILLARY: 113 mg/dL — AB (ref 65–99)
GLUCOSE-CAPILLARY: 114 mg/dL — AB (ref 65–99)
Glucose-Capillary: 106 mg/dL — ABNORMAL HIGH (ref 65–99)
Glucose-Capillary: 111 mg/dL — ABNORMAL HIGH (ref 65–99)
Glucose-Capillary: 108 mg/dL — ABNORMAL HIGH (ref 65–99)
Glucose-Capillary: 95 mg/dL (ref 65–99)
Glucose-Capillary: 110 mg/dL — ABNORMAL HIGH (ref 65–99)

## 2017-10-05 LAB — CBC
HCT: 31.8 % — ABNORMAL LOW (ref 39.0–52.0)
HEMOGLOBIN: 10.1 g/dL — AB (ref 13.0–17.0)
MCH: 30.6 pg (ref 26.0–34.0)
MCHC: 31.8 g/dL (ref 30.0–36.0)
MCV: 96.4 fL (ref 78.0–100.0)
PLATELETS: 323 10*3/uL (ref 150–400)
RBC: 3.3 MIL/uL — AB (ref 4.22–5.81)
RDW: 15.3 % (ref 11.5–15.5)
WBC: 10.4 10*3/uL (ref 4.0–10.5)

## 2017-10-05 LAB — BASIC METABOLIC PANEL
Anion gap: 7 (ref 5–15)
BUN: 59 mg/dL — ABNORMAL HIGH (ref 6–20)
CALCIUM: 8.9 mg/dL (ref 8.9–10.3)
CO2: 26 mmol/L (ref 22–32)
Chloride: 109 mmol/L (ref 101–111)
Creatinine, Ser: 1.87 mg/dL — ABNORMAL HIGH (ref 0.61–1.24)
GFR calc non Af Amer: 37 mL/min — ABNORMAL LOW (ref >60)
GFR, EST AFRICAN AMERICAN: 43 mL/min — AB (ref >60)
GLUCOSE: 99 mg/dL (ref 65–99)
Potassium: 3.9 mmol/L (ref 3.5–5.1)
Sodium: 142 mmol/L (ref 135–145)

## 2017-10-05 LAB — HEPARIN LEVEL (UNFRACTIONATED): Heparin Unfractionated: 0.67 IU/mL (ref 0.30–0.70)

## 2017-10-05 MED ORDER — WARFARIN SODIUM 5 MG PO TABS
5.0000 mg | ORAL_TABLET | Freq: Once | ORAL | Status: DC
  Filled 2017-10-05: qty 1

## 2017-10-05 MED ORDER — WARFARIN - PHARMACIST DOSING INPATIENT: Freq: Every day | Status: DC

## 2017-10-05 MED ORDER — POLYETHYLENE GLYCOL 3350 17 G PO PACK
17.0000 g | PACK | Freq: Every day | ORAL | Status: DC | PRN
  Filled 2017-10-05: qty 1

## 2017-10-05 MED ORDER — FUROSEMIDE 10 MG/ML IJ SOLN
40.0000 mg | Freq: Two times a day (BID) | INTRAMUSCULAR | Status: AC
  Administered 2017-10-05 (×2): 40 mg via INTRAVENOUS
  Filled 2017-10-05 (×3): qty 4

## 2017-10-05 NOTE — Progress Notes (Signed)
PROGRESS NOTE    Curtis Keller  VQM:[id number] DOB: [birth year] DOA: 09/20/2017 [facility]: [facility]   Brief Narrative:  61 yr old M, SNF resident, with PMHx  COPD(On home oxygen therapy) , MI ,Ischemic cardiomyopathy (EF 93%),OIZTIWP systolic CHF , CAD, Apical mural LV thrombus, HTN, Anemia, Stroke  (12/19/2015) with residual Left hemiplegia, Depression,  Dementia, Anxiety, Cerebral infarction Chronic bronchitis, ,Dysphagia, Falls, Hepatitis C, gout, HLD, Protein calorie malnutrition, Spleen injury Status post tracheostomy ([place]), Diabetes Type II .    Admitted 11/28 with reports of altered mental status for 2 days. Found to have MRSA PNA. Intubated x 3. Patient has also been trached 3, last trach on 12/7.        Subjective: 12/13     Assessment & Plan:   Active Problems:   Chronic systolic CHF (congestive heart failure) (HCC)   Acute respiratory failure (HCC)   Acute systolic (congestive) heart failure (HCC)   Pressure ulcer   Acute respiratory failure with hypoxia (HCC)   Acute hypoxemic respiratory failure (HCC)   MRSA bacteremia   Acute encephalopathy   Clostridium perfringens bacteremia    Tracheostomy status (HCC)     Acute respiratory failure with hypoxia  -in the setting of diffuse pulmonary infiltrates initially was PNA related now seems more volume related.   MRSA pneumonia/Acute Lung Injury   Left Pleural Effusion   Pulmonary Edema   Air leak trach Titrate O2 for sat of 88-92% PSV as tolerated (not tolerating thus far) Trach care per protocol  Diuresis per heart failure team.  ABX as outlined per ID, stop dates in place for 12/14  Follow intermittent CXR > Repeat in AM  Will need trach changed to XLT once track more mature and established. (roughly 12/14)    Septic Shock - volume responsive w/ Clostridium Perfringens and MRSA bacteremia   Chronic Systolic CHF (EF 80%)  -Repeat echocardiogram unchanged EF  Elevated troponin, suspect  demand ischemia he is not a candidate for intervention; this has trended down Tachycardia:  multiple EKG's show frequent PVC, has had episodes of SVT and even non-sustained VT though less frequent on BB  Cardiology/Heart failure Following  Cardiac monitoring  KVO IVF Coreg increased to 6.25 mg BID 10/03/17   LEFT  ventricular thrombus,  -Heparin drip --> warfarin therapy per pharmacy 12/14   Anemia   Heparin infusion per pharmacy Convert back to coumadin once off flagyl (12/14) Trend CBC   Acute on CKD stage III (baseline Cr~1.87)  Recent Labs  Lab 10/01/17 0500 10/01/17 1745 10/02/17 0426 10/03/17 0512 10/04/17 0900 10/05/17 0800  CREATININE 2.01* 1.92* 1.82* 1.80* 1.83* 1.87*  -at baseline   Metabolic encephalopathy  (improving); with history of stroke and left-sided hemiplegia since June 2018.   PT efforts  Hold home zanaflex > if restarted would reduce to lowest dose (oversedate with restart)  Valium 84m QD (home med) - mentating well on this no evidence of oversedation PRN fentanyl and versed      Dysphasia;  has chronic PEG, tolerates soft diet at SNF  Hx of Hep C    Diabetes type 2 uncontrolled with complications  -199/83Hemoglobin A1c = 10.1     Sacral Decubitus Ulcer  - moisture associated  Frequent turning  WOC directing care Space boots for heel protection  Foam dressing to sacrum, heels   Mechanical Falls with Prior Hip Fracture Monitor / fall precautions  PT efforts as able      DVT prophylaxis: Heparin  drip Code Status: Full Family Communication: None Disposition Plan: LTAC [facility]?   Consultants:  [facility] M Heart failure ID   Procedures/Significant Events:  11/28  Admit 12/03  Extubated  12/04  Re-intubated (suspect medication related) 12/05  TEE negative for vegetation   TEE 12/6 >> diffuse hypokinesis, LVEF 20-25%, may be a small calcified LV thrombus at the apex, no vegetation     I have personally reviewed and  interpreted all radiology studies and my findings are as above.  VENTILATOR SETTINGS: PRVC FiO2: 40% Set rate;16 Vt set 620 ml PEEP: 5 cm H2O Pressure-support: 5 cm H2O   Cultures Sputum culture 11/28 > MRSA BCx2 11/28 > MRSA, C perfringens BCx2 11/30 > negative, final   Antimicrobials: Anti-infectives (From admission, onward)   Start     Dose/Rate Stop   09/29/17 0800  vancomycin (VANCOCIN) 500 mg in sodium chloride 0.9 % 100 mL IVPB     500 mg 100 mL/hr over 60 Minutes 10/05/17 0850   09/22/17 1000  metroNIDAZOLE (FLAGYL) IVPB 500 mg     500 mg 100 mL/hr over 60 Minutes 10/05/17 2359   09/21/17 0600  vancomycin (VANCOCIN) IVPB 750 mg/150 ml premix  Status:  Discontinued     750 mg 150 mL/hr over 60 Minutes 09/29/17 0620   09/20/17 1400  piperacillin-tazobactam (ZOSYN) IVPB 3.375 g  Status:  Discontinued     3.375 g 12.5 mL/hr over 240 Minutes 09/21/17 1004   09/20/17 0800  vancomycin (VANCOCIN) IVPB 750 mg/150 ml premix     750 mg 150 mL/hr over 60 Minutes 09/20/17 2012   09/20/17 0415  ceFEPIme (MAXIPIME) 2 g in dextrose 5 % 50 mL IVPB     2 g 100 mL/hr over 30 Minutes 09/20/17 0512   09/20/17 0415  vancomycin (VANCOCIN) IVPB 1000 mg/200 mL premix     1,000 mg 200 mL/hr over 60 Minutes 09/20/17 0548       Devices    LINES / TUBES:  RUE PICC 12/4 >> ETT 12/2 > 12/3 ETT 12/4 > 12/6 ETT 12/7 > 12/7 Trach [place] 8 cuffed 12/7 >>     Continuous Infusions: . sodium chloride 250 mL (10/04/17 1900)  . feeding supplement (VITAL AF 1.2 CAL) 1,000 mL (10/04/17 2000)  . heparin 2,950 Units/hr (10/04/17 2000)  . metronidazole Stopped (10/05/17 0237)  . vancomycin 500 mg (10/05/17 0750)     Objective: Vitals:   10/05/17 0500 10/05/17 0600 10/05/17 0725 10/05/17 0823  BP: (!) 120/98 104/76  102/78  Pulse: 67 67  66  Resp: 15 16    Temp:   98.9 F (37.2 C)   TempSrc:   Oral   SpO2: 97% 93%  94%  Weight: 207 lb 7.3 oz (94.1 kg)     Height:         Intake/Output Summary (Last 24 hours) at 10/05/2017 0837 Last data filed at 10/05/2017 0505 Gross per 24 hour  Intake 3602 ml  Output 2200 ml  Net 1402 ml   Filed Weights   10/03/17 0500 10/04/17 0431 10/05/17 0500  Weight: 202 lb 6.1 oz (91.8 kg) 209 lb 14.1 oz (95.2 kg) 207 lb 7.3 oz (94.1 kg)    Examination:  General: Alert, follows all commands, positive acute respiratory distress Neck:  Negative scars, masses, torticollis, lymphadenopathy, JVD, #8 cuffed trach in place (patient talking over cuff) Lungs: diffuse coarse breath sounds, negative wheezes, negative crackles  Cardiovascular: Regular rate and rhythm without murmur gallop or rub normal  S1 and S2 Abdomen: negative abdominal pain, nondistended, positive soft, bowel sounds, no rebound, no ascites, no appreciable mass Extremities: No significant cyanosis, clubbing, or edema bilateral lower extremities Skin: Negative rashes, lesions, ulcers Psychiatric:  Unable to assess secondary to patient being on trach   Central nervous system:  Cranial nerves II through XII intact, tongue/uvula midline, all extremities muscle strength 5/5, sensation intact throughout, negative dysarthria, negative expressive aphasia, negative receptive aphasia.  .     Data Reviewed: Care during the described time interval was provided by me .  I have reviewed this patient's available data, including medical history, events of note, physical examination, and all test results as part of my evaluation.   CBC: Recent Labs  Lab 10/01/17 0500 10/02/17 0426 10/03/17 0512 10/04/17 0429 10/05/17 0500  WBC 21.6* 16.6* 10.9* 9.0 10.4  HGB 9.9* 10.0* 9.5* 9.7* 10.1*  HCT 31.2* 31.5* 30.6* 31.1* 31.8*  MCV 96.9 97.2 97.1 97.8 96.4  PLT 257 280 315 321 366   Basic Metabolic Panel: Recent Labs  Lab 09/29/17 0430  10/01/17 0500 10/01/17 1745 10/02/17 0426 10/03/17 0512 10/04/17 0900  NA 145   < > 147* 147* 148* 144 144  K 3.2*   < > 3.7 3.8  3.5 3.7 4.3  CL 114*   < > 113* 114* 113* 111 112*  CO2 25   < > _0 GLUCOSE 93   < > 113* 139* 118* 119* 104*  BUN 77*   < > 68* 66* 65* 60* 57*  CREATININE 1.84*   < > 2.01* 1.92* 1.82* 1.80* 1.83*  CALCIUM 8.7*   < > 8.8* 8.6* 8.7* 8.7* 8.7*  MG 2.1  --  2.0  --  1.9 2.0 1.9  PHOS 3.3  --  3.2  --  3.4 3.8  --    < > = values in this interval not displayed.   GFR: Estimated Creatinine Clearance: 49.8 mL/min (A) (by C-G formula based on SCr of 1.83 mg/dL (H)). Liver Function Tests: No results for input(s): AST, ALT, ALKPHOS, BILITOT, PROT, ALBUMIN in the last 168 hours. No results for input(s): LIPASE, AMYLASE in the last 168 hours. No results for input(s): AMMONIA in the last 168 hours. Coagulation Profile: No results for input(s): INR, PROTIME in the last 168 hours. Cardiac Enzymes: No results for input(s): CKTOTAL, CKMB, CKMBINDEX, TROPONINI in the last 168 hours. BNP (last 3 results) No results for input(s): PROBNP in the last 8760 hours. HbA1C: No results for input(s): HGBA1C in the last 72 hours. CBG: Recent Labs  Lab 10/04/17 1517 10/04/17 2023 10/05/17 0056 10/05/17 0438 10/05/17 0722  GLUCAP 102* 111* 106* 114* 113*   Lipid Profile: No results for input(s): CHOL, HDL, LDLCALC, TRIG, CHOLHDL, LDLDIRECT in the last 72 hours. Thyroid Function Tests: No results for input(s): TSH, T4TOTAL, FREET4, T3FREE, THYROIDAB in the last 72 hours. Anemia Panel: No results for input(s): VITAMINB12, FOLATE, FERRITIN, TIBC, IRON, RETICCTPCT in the last 72 hours. Urine analysis:    Component Value Date/Time   COLORURINE YELLOW 09/21/2017 1257   APPEARANCEUR HAZY (A) 09/21/2017 1257   LABSPEC 1.024 09/21/2017 1257   PHURINE 5.0 09/21/2017 1257   GLUCOSEU NEGATIVE 09/21/2017 1257   HGBUR LARGE (A) 09/21/2017 1257   BILIRUBINUR NEGATIVE 09/21/2017 1257   KETONESUR NEGATIVE 09/21/2017 1257   PROTEINUR 100 (A) 09/21/2017 1257   NITRITE NEGATIVE 09/21/2017 1257    LEUKOCYTESUR NEGATIVE 09/21/2017 1257   Sepsis Labs: _1 (procalcitonin:4,lacticidven:4)  )No  results found for this or any previous visit (from the past 240 hour(s)).       Radiology Studies: No results found.      Scheduled Meds: . atorvastatin  40 mg Per Tube QHS  . carvedilol  9.375 mg Per Tube BID WC  . chlorhexidine gluconate (MEDLINE KIT)  15 mL Mouth Rinse BID  . Chlorhexidine Gluconate Cloth  6 each Topical Daily  . collagenase   Topical Daily  . diazepam  2 mg Oral Daily  . docusate  100 mg Oral BID  . famotidine  20 mg Per Tube Daily  . feeding supplement (PRO-STAT SUGAR FREE 64)  30 mL Per Tube Daily  . furosemide  40 mg Intravenous BID  . insulin aspart  0-15 Units Subcutaneous Q4H  . mouth rinse  15 mL Mouth Rinse QID  . multivitamin  15 mL Per Tube Daily  . polyethylene glycol  17 g Oral Daily  . sennosides  5 mL Per Tube BID  . sodium chloride flush  10-40 mL Intracatheter Q12H   Continuous Infusions: . sodium chloride 250 mL (10/04/17 1900)  . feeding supplement (VITAL AF 1.2 CAL) 1,000 mL (10/04/17 2000)  . heparin 2,950 Units/hr (10/04/17 2000)  . metronidazole Stopped (10/05/17 0237)  . vancomycin 500 mg (10/05/17 0750)     LOS: 15 days    Time spent: 40 minutes    , Geraldo Docker, MD [facility] Pager [phone]   If 7PM-7AM, please contact night-coverage [email or web address] Password [facility] 10/05/2017, 8:37 AM

## 2017-10-05 NOTE — Progress Notes (Signed)
Patient ID: Curtis Keller, male   DOB: [birth year], 61 y.o.   MRN: [id number]      Advanced Heart Failure Rounding Note   Subjective:    Patient was extubated 12/6. Respiratory distress on 12/7 and re-intubated.  He then had tracheostomy placed.    No further NSVT yesterday.  He is awake on vent this morning.  Failed vent wean yesterday.  CVP 12 this morning.  No BMET yet.   Denies SOB.    Objective:   Weight Range: 207 lb 7.3 oz (94.1 kg) Body mass index is 28.14 kg/m.   Vital Signs:   Temp:  [98.4 F (36.9 C)-99.3 F (37.4 C)] 98.9 F (37.2 C) (12/13 0725) Pulse Rate:  [37-87] 67 (12/13 0600) Resp:  [14-27] 16 (12/13 0600) BP: (84-120)/(60-98) 104/76 (12/13 0600) SpO2:  [88 %-100 %] 93 % (12/13 0600) FiO2 (%):  [40 %] 40 % (12/13 0400) Weight:  [207 lb 7.3 oz (94.1 kg)] 207 lb 7.3 oz (94.1 kg) (12/13 0500) Last BM Date: 10/04/17  Weight change: Filed Weights   10/03/17 0500 10/04/17 0431 10/05/17 0500  Weight: 202 lb 6.1 oz (91.8 kg) 209 lb 14.1 oz (95.2 kg) 207 lb 7.3 oz (94.1 kg)    Intake/Output:   Intake/Output Summary (Last 24 hours) at 10/05/2017 0758 Last data filed at 10/05/2017 0505 Gross per 24 hour  Intake 3602 ml  Output 2200 ml  Net 1402 ml      Physical Exam  CVP 12  General: NAD Neck: JVP difficult, trach in place, no thyromegaly or thyroid nodule.  Lungs: Coarse BS bilaterally CV: Nondisplaced PMI.  Heart regular S1/S2, no S3/S4, no murmur.  No peripheral edema.   Abdomen: Soft, nontender, no hepatosplenomegaly, no distention.  Skin: Intact without lesions or rashes.  Neurologic: Alert, follows commands.  Extremities: No clubbing or cyanosis.  HEENT: Normal.    Telemetry   NSR with PVCs (personally reviewed).   Labs    CBC Recent Labs    10/04/17 0429 10/05/17 0500  WBC 9.0 10.4  HGB 9.7* 10.1*  HCT 31.1* 31.8*  MCV 97.8 96.4  PLT 321 654   Basic Metabolic Panel Recent Labs    10/03/17 0512 10/04/17 0900  NA 144  144  K 3.7 4.3  CL 111 112*  CO2 27 26  GLUCOSE 119* 104*  BUN 60* 57*  CREATININE 1.80* 1.83*  CALCIUM 8.7* 8.7*  MG 2.0 1.9  PHOS 3.8  --    Liver Function Tests No results for input(s): AST, ALT, ALKPHOS, BILITOT, PROT, ALBUMIN in the last 72 hours. No results for input(s): LIPASE, AMYLASE in the last 72 hours. Cardiac Enzymes No results for input(s): CKTOTAL, CKMB, CKMBINDEX, TROPONINI in the last 72 hours.  BNP: BNP (last 3 results) Recent Labs    03/17/17 1438 03/30/17 0629 09/20/17 0545  BNP 601.2* 199.5* 1,771.9*    ProBNP (last 3 results) No results for input(s): PROBNP in the last 8760 hours.   D-Dimer No results for input(s): DDIMER in the last 72 hours. Hemoglobin A1C No results for input(s): HGBA1C in the last 72 hours. Fasting Lipid Panel No results for input(s): CHOL, HDL, LDLCALC, TRIG, CHOLHDL, LDLDIRECT in the last 72 hours. Thyroid Function Tests No results for input(s): TSH, T4TOTAL, T3FREE, THYROIDAB in the last 72 hours.  Invalid input(s): FREET3  Other results:   Imaging    No results found.   Medications:     Scheduled Medications: . atorvastatin  40 mg Per  Tube QHS  . carvedilol  9.375 mg Per Tube BID WC  . chlorhexidine gluconate (MEDLINE KIT)  15 mL Mouth Rinse BID  . Chlorhexidine Gluconate Cloth  6 each Topical Daily  . collagenase   Topical Daily  . diazepam  2 mg Oral Daily  . docusate  100 mg Oral BID  . famotidine  20 mg Per Tube Daily  . feeding supplement (PRO-STAT SUGAR FREE 64)  30 mL Per Tube Daily  . furosemide  40 mg Intravenous BID  . insulin aspart  0-15 Units Subcutaneous Q4H  . mouth rinse  15 mL Mouth Rinse QID  . multivitamin  15 mL Per Tube Daily  . polyethylene glycol  17 g Oral Daily  . sennosides  5 mL Per Tube BID  . sodium chloride flush  10-40 mL Intracatheter Q12H    Infusions: . sodium chloride 250 mL (10/04/17 1900)  . feeding supplement (VITAL AF 1.2 CAL) 1,000 mL (10/04/17 2000)  .  heparin 2,950 Units/hr (10/04/17 2000)  . metronidazole Stopped (10/05/17 0237)  . vancomycin 500 mg (10/05/17 0750)    PRN Medications: sodium chloride, bisacodyl, fentaNYL (SUBLIMAZE) injection, metoprolol tartrate, midazolam, sodium chloride flush    Patient Profile   Curtis Keller is a 61 y.o. male with history of COPD, Systolic CHF EF 30%, CAD, hx of CVA with left sided weakness, h/o of prolonged vent wean s/p trach, peg s/p decannulation, h/o Hepatitis C, as well as known apical mural thrombus admitted with worsening SOB and AMS  Found to have MRSA PNA and C. Perfringens + MRSA bacteremia.   Assessment/Plan   1. Septic shock: Hypotensive initially, MRSA PNA with MRSA and C perfringens in blood.  He is now off pressors. Stable today.   2. Acute hypoxemic respiratory failure: MRSA PNA.  He now has a tracheostomy.  Failed weaning yesterday.  3. Acute on chronic systolic CHF: History of ischemic cardiomyopathy.  TEE this admission with EF 20-25%. Co-ox has been adequate.  CVP 12 this morning. - Lasix 40 mg IV bid today, follow response.  - Pending BMET, will add spironolactone if K/creatinine stable.  - Continue Coreg 9.375 mg bid.    - No ARB/ACEI/ARNI with elevated creatinine.  3. PVCs/short episodes of NSVT. 12/11 30 beats NSVT   - Not ICD candidate given baseline debilitation (left hemiparesis, PEG, living in SNF).  - Continue Coreg.  4. ID: No endocarditis on TEE.  Had MRSA PNA with MRSA and C perfringens in blood. On vancomycin and Flagyl.  5. CAD: Has history of BMS to RCA in 2016.  Troponin to 2.6 this admission, could be demand ischemia from septic shock.   - Continue statin.  - No ASA with full anticoagulation.  - Consider eventual cath but would need to see improved renal function.  6. AKI on CKD: Pending BMET today. 7. LV thrombus: Chronic warfarin, TEE appeared to show a small calcified LV thrombus.   - Currently on heparin gtt, will start on warfarin today as  long as CCM agrees.   Length of Stay: [place], MD  10/05/2017, 7:58 AM  Advanced Heart Failure Team Pager [phone] (M-F; 7a - 4p)  Please contact [facility] for night-coverage after hours (4p -7a ) and weekends on [email or web address]

## 2017-10-05 NOTE — Progress Notes (Addendum)
ANTICOAGULATION CONSULT NOTE Pharmacy Consult:  Heparin/warfarin  Indication:  History of atrial thrombus  Allergies  Allergen Reactions  . Codeine Anaphylaxis and Nausea And Vomiting  . Lisinopril Itching and Rash  . Hydrocodone Other (See Comments)    On MAR  . Hydrocodone-Acetaminophen Nausea And Vomiting    Cannot take any acetaminophen due to liver issues per sister  . Tegaderm Ag Mesh [Silver] Other (See Comments)    Allergy to "silver compounds" listed on MAR  . Tylenol [Acetaminophen] Nausea Only and Other (See Comments)    Cannot take any acetaminophen due to liver issues per sister    Patient Measurements: Height: 6' (182.9 cm) Weight: 207 lb 7.3 oz (94.1 kg) IBW/kg (Calculated) : 77.6  Heparin Dosing weight: 91.8 kg  Vital Signs: Temp: 98.9 F (37.2 C) (12/13 0725) Temp Source: Oral (12/13 0725) BP: 102/78 (12/13 0823) Pulse Rate: Curtis (12/13 0823)  Labs: Recent Labs    10/03/17 0512  10/03/17 2247 10/04/17 0429 10/04/17 0900 10/05/17 0500 10/05/17 0800  HGB 9.5*  --   --  9.7*  --  10.1*  --   HCT 30.6*  --   --  31.1*  --  31.8*  --   PLT 315  --   --  321  --  323  --   HEPARINUNFRC 0.70   < > 0.67 0.67  --  0.67  --   CREATININE 1.80*  --   --   --  1.83*  --  1.87*   < > = values in this interval not displayed.    Estimated Creatinine Clearance: 48.8 mL/min (A) (by C-G formula based on SCr of 1.87 mg/dL (H)).  Assessment: 61 y.o. Keller with h/o atrial thrombus on IV heparin at 2950 units/hr. HL this AM is therapeutic. Pharmacy consulted to resume home warfarin therapy.  Hgb 10.1, Platelets 323. Last INR was 1.77 on 11/30 No bleeding reported.   Home warfarin dose: 5 mg daily   Interacting medications include: metronidazole 500 mg IV Q 8 hours     Goal of Therapy:  Heparin level 0.3-0.7 units/ml Monitor platelets by anticoagulation protocol: Yes  Plan:  Continue Heparin to 2950 units/hr Warfarin 5 mg once. If INR trends up fast, then  consider alternative to metronidazole  Daily Heparin level, INR, and CBC while on therapy.  Stop heparin when INR > 2    Vinnie Level, PharmD., BCPS Clinical Pharmacist Pager [phone]  Addendum: Per CCM, will wait to start warfarin until off IV flagyl which will be tomorrow. Will continue heparin and d/c warfarin for now. Will order first dose of warfarin tomorrow.   Vinnie Level, PharmD., BCPS Clinical Pharmacist Pager [phone]

## 2017-10-05 NOTE — Progress Notes (Signed)
Physical Therapy Wound Treatment Patient Details  Name: Curtis Keller MRN: [id number] Date of Birth: May 10, 1956  Today's Date: 10/05/2017 Time: 1013-1049 Time Calculation (min): 36 min  Subjective  Subjective: trached, but alert Patient and Family Stated Goals: pt agreed to the hydrotherapy  Pain Score: Pain Score:  Yells out to pain and cold.  Premedicated prior to hydro.   Wound Assessment  Pressure Injury 09/20/17 Unstageable - Full thickness tissue loss in which the base of the ulcer is covered by slough (yellow, tan, gray, green or brown) and/or eschar (tan, brown or black) in the wound bed. previous DTI to bilat buttocks and sacrum has  (Active)  Dressing Type ABD;Barrier Film (skin prep);Gauze (Comment);Moist to dry 10/05/2017 12:46 PM  Dressing Clean;Dry;Intact 10/05/2017 12:46 PM  Dressing Change Frequency Daily 10/05/2017 12:46 PM  State of Healing Early/partial granulation 10/05/2017 12:46 PM  Site / Wound Assessment Yellow;Pink 10/05/2017 12:46 PM  % Wound base Red or Granulating 25% 10/05/2017 12:46 PM  % Wound base Yellow/Fibrinous Exudate 75% 10/05/2017 12:46 PM  % Wound base Black/Eschar 0% 10/05/2017 12:46 PM  % Wound base Other/Granulation Tissue (Comment) 0% 10/05/2017 12:46 PM  Peri-wound Assessment Erythema (blanchable) 10/05/2017 12:46 PM  Wound Length (cm) 9 cm 10/05/2017 12:46 PM  Wound Width (cm) 8 cm 10/05/2017 12:46 PM  Wound Depth (cm) 0 cm 10/05/2017 12:46 PM  Wound Surface Area (cm^2) 72 cm^2 10/05/2017 12:46 PM  Wound Volume (cm^3) 0 cm^3 10/05/2017 12:46 PM  Margins Unattached edges (unapproximated) 10/05/2017 12:46 PM  Drainage Amount Minimal 10/05/2017 12:46 PM  Drainage Description Serous 10/05/2017 12:46 PM  Treatment Cleansed;Debridement (Selective);Hydrotherapy (Pulse lavage);Packing (Saline gauze) 10/05/2017 12:46 PM   Santyl applied to wound bed prior to applying dressing.    Hydrotherapy Pulsed lavage therapy - wound location:  sacral Pulsed Lavage with Suction (psi): 8 psi Pulsed Lavage with Suction - Normal Saline Used: 1000 mL Pulsed Lavage Tip: Tip with splash shield Selective Debridement Selective Debridement - Location: sacral Selective Debridement - Tools Used: Forceps;Scalpel Selective Debridement - Tissue Removed: lightly adhered slough/yellow eschar   Wound Assessment and Plan  Wound Therapy - Assess/Plan/Recommendations Wound Therapy - Clinical Statement: pt's wound will benefit from hydro therapy for PLS to decrease bio-burdin and soften eschar for debridement.  At this point expect a large portion to be superficial, but the middle is unstageable. Wound Therapy - Functional Problem List: lack of mobility on trach at present Factors Delaying/Impairing Wound Healing: Immobility;Multiple medical problems Hydrotherapy Plan: Debridement;Dressing change;Pulsatile lavage with suction Wound Therapy - Frequency: 6X / week Wound Therapy - Current Recommendations: PT Wound Therapy - Follow Up Recommendations: Skilled nursing facility Wound Plan: see above  Wound Therapy Goals- Improve the function of patient's integumentary system by progressing the wound(s) through the phases of wound healing (inflammation - proliferation - remodeling) by: Decrease Necrotic Tissue to: 20% Decrease Necrotic Tissue - Progress: Progressing toward goal Increase Granulation Tissue to: 80% Increase Granulation Tissue - Progress: Progressing toward goal Goals/treatment plan/discharge plan were made with and agreed upon by patient/family: Yes Time For Goal Achievement: 7 days Wound Therapy - Potential for Goals: Good  Goals will be updated until maximal potential achieved or discharge criteria met.  Discharge criteria: when goals achieved, discharge from hospital, MD decision/surgical intervention, no progress towards goals, refusal/missing three consecutive treatments without notification or medical reason.  Curtis Keller     Curtis Keller  Curtis Keller 10/05/2017, 12:53 PM  10/05/2017  Donnella Sham, PT [phone]  (pager)

## 2017-10-05 NOTE — Progress Notes (Signed)
CSW received message from [place] from [facility]. Antony Contras informed that her team is looking at the referral sent by Social Work department for SNF placement. Antony Contras stated that the team will have an answer on whether or not they will make a bed offer by tomorrow, 10/06/17. Jenni asked SW to let her know when the pt is close to or ready to be discharged from the hospital to allow them time to reach out to the family and get a [place]. Antony Contras stated she started a Medicaid application.   Montine Circle, [facility]  [phone]

## 2017-10-05 NOTE — Progress Notes (Signed)
PULMONARY / CRITICAL CARE MEDICINE   Name: Curtis Keller MRN: [id number] DOB: 02-23-1956    ADMISSION DATE:  09/20/2017 CONSULTATION DATE:  10/20/2017  REFERRING MD:  ED  CHIEF COMPLAINT:  Shortness of breath  Brief:   61 yr old M, SNF resident, with PMH of COPD, CHF EF 20%, CAD, apical mural thrombus   Admitted 11/28 with reports of altered mental status for 2 days. Found to have MRSA PNA. Intubated x 3 and ultimately trached on 12/7.   SUBJECTIVE:   No events overnight, no new complaints, diarrhea  VITAL SIGNS: BP 102/78   Pulse 66   Temp 98.9 F (37.2 C) (Oral)   Resp 16   Ht 6' (1.829 m)   Wt 94.1 kg (207 lb 7.3 oz)   SpO2 94%   BMI 28.14 kg/m   HEMODYNAMICS: CVP:  [8 mmHg-13 mmHg] 13 mmHg  VENTILATOR SETTINGS: Vent Mode: PRVC FiO2 (%):  [40 %] 40 % Set Rate:  [16 bmp] 16 bmp Vt Set:  [620 mL] 620 mL PEEP:  [5 cmH20] 5 cmH20 Pressure Support:  [5 cmH20] 5 cmH20 Plateau Pressure:  [18 cmH20-22 cmH20] 19 cmH20  INTAKE / OUTPUT: I/O last 3 completed shifts: In: 4875.5 [I.V.:1855.5; NG/GT:2820; IV Piggyback:200] Out: 2800 [Urine:2800]  PHYSICAL EXAMINATION: General: Chronically ill appearing male on the vent, NAD HEENT: [place]/AT, PERRL, EOM-I and MMM Neuro: Awake and interactive, moving all ext to command PULM: Coarse BS diffusely GI: Soft, NT, ND and +BS Extremities: -edema and -tenderness Skin: Decub noted  LABS:  BMET Recent Labs  Lab 10/03/17 0512 10/04/17 0900 10/05/17 0800  NA 144 144 142  K 3.7 4.3 3.9  CL 111 112* 109  CO2 27 26 26   BUN 60* 57* 59*  CREATININE 1.80* 1.83* 1.87*  GLUCOSE 119* 104* 99   Electrolytes Recent Labs  Lab 10/01/17 0500  10/02/17 0426 10/03/17 0512 10/04/17 0900 10/05/17 0800  CALCIUM 8.8*   < > 8.7* 8.7* 8.7* 8.9  MG 2.0  --  1.9 2.0 1.9  --   PHOS 3.2  --  3.4 3.8  --   --    < > = values in this interval not displayed.   CBC Recent Labs  Lab 10/03/17 0512 10/04/17 0429 10/05/17 0500  WBC  10.9* 9.0 10.4  HGB 9.5* 9.7* 10.1*  HCT 30.6* 31.1* 31.8*  PLT 315 321 323   Coag's No results for input(s): APTT, INR in the last 168 hours.  Sepsis Markers Recent Labs  Lab 09/29/17 1031  LATICACIDVEN 2.0*   ABG Recent Labs  Lab 09/29/17 0402 09/29/17 0952  PHART 7.428 7.289*  PCO2ART 37.2 47.5  PO2ART 60.6* 136.0*   Liver Enzymes No results for input(s): AST, ALT, ALKPHOS, BILITOT, ALBUMIN in the last 168 hours. Cardiac Enzymes No results for input(s): TROPONINI, PROBNP in the last 168 hours. Glucose Recent Labs  Lab 10/04/17 1140 10/04/17 1517 10/04/17 2023 10/05/17 0056 10/05/17 0438 10/05/17 0722  GLUCAP 118* 102* 111* 106* 114* 113*   Imaging No results found.  CULTURES Sputum culture 11/28 > MRSA BCx2 11/28 > MRSA, C perfringens BCx2 11/30 > negative Urine 11/29>>> neg   ANTIBIOTICS: Zosyn 11/28 > 11/29 Vanc 11/28 >> Flagyl 11/30 >>   EVENTS 11/28  Admit 12/03  Extubated  12/04  Re-intubated (suspect medication related) 12/05  TEE   STUDIES TEE 12/6 >> diffuse hypokinesis, LVEF 20-25%, may be a small calcified LV thrombus at the apex, no vegetation  LINES RUE PICC  12/4 >> ETT 12/2 > 12/3 ETT 12/4 > 12/6 ETT 12/7 > 12/7 Trach 12/7 Shiley 8 cuffed >>   ASSESSMENT / PLAN: 60 year old SNF resident with multiple medical issues admitted with MRSA and Clostridium bacteremia, & MRSA HCAP.  Acute hypoxemic respiratory failure in the setting of diffuse pulmonary infiltrates initially was PNA related now seems more volume related.  MRSA pneumonia with ALI Left Pleural Effusion  Pulmonary Edema  Air leak trach P: Titrate O2 for sat of 88-92% Daily attempts at pressure support wean-failed yesterday and this morning Diuresis as blood pressure and renal function tolerate per heart failure team Antibiotics as above-stop date in place for 12/14 Intermittent chest x-ray Will need trach changed to XLT once track more mature and  established  Septic Shock - volume responsive w/ Clostridium Perfringens and MRSA bacteremia  Systolic HFrEF (20%). Repeat echo shows unchaged EF Elevated troponin, suspect demand ischemia he is not a candidate for intervention; this has trended down  P: Cardiology/Heart failure Following Diuresis as above Cardiac monitoring  KVO IVF Continue coreg  History of left ventricular thrombus, on chronic warfarin therapy Anemia  P: Heparin infusion per pharmacy Convert back to coumadin once off flagyl Trend CBC  Acute on chronic kidney disease (improving) Mild hyperchloremia P: Continue Free water 250 ml q6-chemistry from this a.m. still pending Trend BMP/urinary output Replace electrolytes as indicated  Metabolic encephalopathy (improving); with history of stroke and left-sided hemiplegia since June 2018.  P: PT efforts  Hold home zanaflex  Valium 2mg  QD (home med)  PRN fentanyl and versed     Constipation-resolved Hx of dysphasia; has chronic PEG, tolerates soft diet at SNF Hx of Hep C P: TF per Nutrition  Change MiraLAX to as needed DC Colace and scheduled Senokot PEG care per protocol Pepcid for SUP   DM P: CBG with SSI   Sacral Decubitus - moisture associated  P: Frequent turning  WOC directing care Space boots for heel protection  Foam dressing to sacrum, heels  Hx Mechanical Falls with Prior Hip Fracture P: Monitor / fall precautions  PT efforts as able   FAMILY  - Updates: No family available 12/13, patient updated, needs placement  - [facility]: [facility] now primary, PCCM following for vent and trach needs.  Awaiting vent SDU bed   Dirk Dress, NP 10/05/2017  9:00 AM Pager: [phone] or [phone]  Attending Note:  61 year old male with chronic respiratory failure that is now trached.  Failed weaning this AM due to sedation.  On exam, he is more alert now and is following commands with coarse BS diffusely.  I reviewed CXR myself,  infiltrate noted and trach is in good position.  Discussed with bedside RN and PCCM-NP.  Respiratory failure:  - Reattempt weaning today  - No TC, PS trials as able  Hypoxemia:  - Titrate O2 for sat of 88-92%  Trach status:  - Maintain current trach size and type, no decannulation, this is the patient's 3rd trach.  - Will need a vent SNF, do not believe patient will fully liberate from the vent  PCCM will f/u twice a week.  See again on Monday.  Patient seen and examined, agree with above note.  I dictated the care and orders written for this patient under my direction.  Curtis Reedy, MD [phone]

## 2017-10-06 DIAGNOSIS — E1165 Type 2 diabetes mellitus with hyperglycemia: Secondary | ICD-10-CM

## 2017-10-06 DIAGNOSIS — L89153 Pressure ulcer of sacral region, stage 3: Secondary | ICD-10-CM

## 2017-10-06 DIAGNOSIS — S27309A Unspecified injury of lung, unspecified, initial encounter: Secondary | ICD-10-CM

## 2017-10-06 DIAGNOSIS — R748 Abnormal levels of other serum enzymes: Secondary | ICD-10-CM

## 2017-10-06 DIAGNOSIS — E118 Type 2 diabetes mellitus with unspecified complications: Secondary | ICD-10-CM

## 2017-10-06 LAB — GLUCOSE, CAPILLARY
GLUCOSE-CAPILLARY: 106 mg/dL — AB (ref 65–99)
GLUCOSE-CAPILLARY: 102 mg/dL — AB (ref 65–99)
Glucose-Capillary: 111 mg/dL — ABNORMAL HIGH (ref 65–99)
Glucose-Capillary: 135 mg/dL — ABNORMAL HIGH (ref 65–99)
Glucose-Capillary: 83 mg/dL (ref 65–99)

## 2017-10-06 LAB — VITAMIN B12: Vitamin B-12: 641 pg/mL (ref 180–914)

## 2017-10-06 LAB — IRON AND TIBC
Iron: 19 ug/dL — ABNORMAL LOW (ref 45–182)
SATURATION RATIOS: 9 % — AB (ref 17.9–39.5)
TIBC: 216 ug/dL — AB (ref 250–450)
UIBC: 197 ug/dL

## 2017-10-06 LAB — PROTIME-INR
INR: 1.35
PROTHROMBIN TIME: 16.6 s — AB (ref 11.4–15.2)

## 2017-10-06 LAB — BASIC METABOLIC PANEL
ANION GAP: 6 (ref 5–15)
BUN: 60 mg/dL — ABNORMAL HIGH (ref 6–20)
CHLORIDE: 110 mmol/L (ref 101–111)
CO2: 27 mmol/L (ref 22–32)
CREATININE: 2.18 mg/dL — AB (ref 0.61–1.24)
Calcium: 9 mg/dL (ref 8.9–10.3)
GFR calc non Af Amer: 31 mL/min — ABNORMAL LOW (ref >60)
GFR, EST AFRICAN AMERICAN: 36 mL/min — AB (ref >60)
Glucose, Bld: 115 mg/dL — ABNORMAL HIGH (ref 65–99)
POTASSIUM: 4.1 mmol/L (ref 3.5–5.1)
SODIUM: 143 mmol/L (ref 135–145)

## 2017-10-06 LAB — CBC
HEMATOCRIT: 30.8 % — AB (ref 39.0–52.0)
HEMOGLOBIN: 9.8 g/dL — AB (ref 13.0–17.0)
MCH: 30.6 pg (ref 26.0–34.0)
MCHC: 31.8 g/dL (ref 30.0–36.0)
MCV: 96.3 fL (ref 78.0–100.0)
Platelets: 294 10*3/uL (ref 150–400)
RBC: 3.2 MIL/uL — AB (ref 4.22–5.81)
RDW: 15.1 % (ref 11.5–15.5)
WBC: 9.7 10*3/uL (ref 4.0–10.5)

## 2017-10-06 LAB — HEPARIN LEVEL (UNFRACTIONATED): HEPARIN UNFRACTIONATED: 0.69 [IU]/mL (ref 0.30–0.70)

## 2017-10-06 LAB — RETICULOCYTES
RBC.: 3.09 MIL/uL — AB (ref 4.22–5.81)
Retic Count, Absolute: 58.7 10*3/uL (ref 19.0–186.0)
Retic Ct Pct: 1.9 % (ref 0.4–3.1)

## 2017-10-06 LAB — FERRITIN: Ferritin: 25 ng/mL (ref 24–336)

## 2017-10-06 LAB — MAGNESIUM: MAGNESIUM: 2.1 mg/dL (ref 1.7–2.4)

## 2017-10-06 LAB — FOLATE: FOLATE: 45.6 ng/mL (ref >5.9)

## 2017-10-06 MED ORDER — WARFARIN - PHARMACIST DOSING INPATIENT: Freq: Every day | Status: DC

## 2017-10-06 MED ORDER — IPRATROPIUM-ALBUTEROL 0.5-2.5 (3) MG/3ML IN SOLN
3.0000 mL | Freq: Four times a day (QID) | RESPIRATORY_TRACT | Status: DC | PRN
  Filled 2017-10-06: qty 3

## 2017-10-06 MED ORDER — WARFARIN SODIUM 5 MG PO TABS
5.0000 mg | ORAL_TABLET | Freq: Once | ORAL | Status: AC
  Administered 2017-10-06: 5 mg via ORAL
  Filled 2017-10-06: qty 1

## 2017-10-06 NOTE — Progress Notes (Signed)
Patient ID: Curtis Keller, male   DOB: October 12, 1956, 61 y.o.   MRN: [id number]      Advanced Heart Failure Rounding Note   Subjective:    Patient was extubated 12/6. Respiratory distress on 12/7 and re-intubated.  He then had tracheostomy placed.    Failed vent wean 09/24/17.    CVP 6-7. On trach collar this am.   SOB on my arrival, but has taken O2 off of his trach collar. Improves with cough and replacing O2  Creatinine 1.87 -> 2.18  Coumadin resume 10/05/17. INR 1.35  Objective:   Weight Range: 208 lb 12.4 oz (94.7 kg) Body mass index is 28.32 kg/m.   Vital Signs:   Temp:  [98 F (36.7 C)-98.9 F (37.2 C)] 98.7 F (37.1 C) (12/14 0754) Pulse Rate:  [36-70] 70 (12/14 0824) Resp:  [16-19] 16 (12/14 0824) BP: (97-126)/(66-88) 115/81 (12/14 0754) SpO2:  [91 %-100 %] 96 % (12/14 0824) FiO2 (%):  [40 %] 40 % (12/14 0824) Weight:  [208 lb 12.4 oz (94.7 kg)] 208 lb 12.4 oz (94.7 kg) (12/14 0418) Last BM Date: 10/05/17  Weight change: Filed Weights   10/04/17 0431 10/05/17 0500 10/06/17 0418  Weight: 209 lb 14.1 oz (95.2 kg) 207 lb 7.3 oz (94.1 kg) 208 lb 12.4 oz (94.7 kg)   Intake/Output:   Intake/Output Summary (Last 24 hours) at 10/06/2017 0836 Last data filed at 10/06/2017 0653 Gross per 24 hour  Intake 2089.5 ml  Output 1800 ml  Net 289.5 ml   Physical Exam   CVP 6-7  General: NAD. Slightly agitated. HEENT: Normal Neck: Supple. JVP difficult, Trach in place, O2 via trach. Carotids 2+ bilat; no bruits. No thyromegaly or nodule noted. Cor: PMI nondisplaced. RRR, No M/G/R noted Lungs: CTAB, normal effort. Abdomen: Soft, non-tender, non-distended, no HSM. No bruits or masses. +BS  Extremities: No cyanosis, clubbing, or rash. No peripheral edema.  Neuro: Alert to person. Follows commands. Cranial nerves grossly intact. moves all 4 extremities w/o difficulty. Affect flat.    Telemetry   NSR 70-80s with occasional PVCs, personally reviewed.   Labs      CBC Recent Labs    10/05/17 0500 10/06/17 0414  WBC 10.4 9.7  HGB 10.1* 9.8*  HCT 31.8* 30.8*  MCV 96.4 96.3  PLT 323 100   Basic Metabolic Panel Recent Labs    10/04/17 0900 10/05/17 0800 10/06/17 0414  NA 144 142 143  K 4.3 3.9 4.1  CL 112* 109 110  CO2 26 26 27   GLUCOSE 104* 99 115*  BUN 57* 59* 60*  CREATININE 1.83* 1.87* 2.18*  CALCIUM 8.7* 8.9 9.0  MG 1.9  --  2.1   Liver Function Tests No results for input(s): AST, ALT, ALKPHOS, BILITOT, PROT, ALBUMIN in the last 72 hours. No results for input(s): LIPASE, AMYLASE in the last 72 hours. Cardiac Enzymes No results for input(s): CKTOTAL, CKMB, CKMBINDEX, TROPONINI in the last 72 hours.  BNP: BNP (last 3 results) Recent Labs    03/17/17 1438 03/30/17 0629 09/20/17 0545  BNP 601.2* 199.5* 1,771.9*    ProBNP (last 3 results) No results for input(s): PROBNP in the last 8760 hours.   D-Dimer No results for input(s): DDIMER in the last 72 hours. Hemoglobin A1C No results for input(s): HGBA1C in the last 72 hours. Fasting Lipid Panel No results for input(s): CHOL, HDL, LDLCALC, TRIG, CHOLHDL, LDLDIRECT in the last 72 hours. Thyroid Function Tests No results for input(s): TSH, T4TOTAL, T3FREE, THYROIDAB in  the last 72 hours.  Invalid input(s): FREET3  Other results:   Imaging   No results found.  Medications:    Scheduled Medications: . atorvastatin  40 mg Per Tube QHS  . carvedilol  9.375 mg Per Tube BID WC  . chlorhexidine gluconate (MEDLINE KIT)  15 mL Mouth Rinse BID  . Chlorhexidine Gluconate Cloth  6 each Topical Daily  . collagenase   Topical Daily  . diazepam  2 mg Oral Daily  . famotidine  20 mg Per Tube Daily  . feeding supplement (PRO-STAT SUGAR FREE 64)  30 mL Per Tube Daily  . insulin aspart  0-15 Units Subcutaneous Q4H  . mouth rinse  15 mL Mouth Rinse QID  . multivitamin  15 mL Per Tube Daily  . sodium chloride flush  10-40 mL Intracatheter Q12H  . warfarin  5 mg Oral  ONCE-1800  . Warfarin - Pharmacist Dosing Inpatient   Does not apply q1800    Infusions: . sodium chloride 250 mL (10/05/17 2039)  . feeding supplement (VITAL AF 1.2 CAL) 1,000 mL (10/05/17 1600)  . heparin 2,950 Units/hr (10/06/17 0539)    PRN Medications: sodium chloride, bisacodyl, fentaNYL (SUBLIMAZE) injection, metoprolol tartrate, midazolam, polyethylene glycol, sodium chloride flush  Patient Profile   Curtis Keller is a 61 y.o. male with history of COPD, Systolic CHF EF 77%, CAD, hx of CVA with left sided weakness, h/o of prolonged vent wean s/p trach, peg s/p decannulation, h/o Hepatitis C, as well as known apical mural thrombus admitted with worsening SOB and AMS  Found to have MRSA PNA and C. Perfringens + MRSA bacteremia.   Assessment/Plan   1. Septic shock: Hypotensive initially, MRSA PNA with MRSA and C perfringens in blood.  He is now off pressors.  - Stable today.  2. Acute hypoxemic respiratory failure: MRSA PNA.  He now has a tracheostomy.   - Failed wean 10/04/17 3. Acute on chronic systolic CHF: History of ischemic cardiomyopathy.  TEE this admission with EF 20-25%. Co-ox has been adequate.   - CVP 6-7 - Now off lasix. Hold with mild AKI.  - No spiro for now with AKI.  - Continue Coreg 9.375 mg bid.    - No ARB/ACEI/ARNI with elevated creatinine.  3. PVCs/short episodes of NSVT. 12/11 30 beats NSVT   - Not ICD candidate given baseline debilitation (left hemiparesis, PEG, living in SNF).  - Continue Coreg as above.  4. ID: No endocarditis on TEE.  Had MRSA PNA with MRSA and C perfringens in blood.  - Per primary.  - Afebrile.  5. CAD: Has history of BMS to RCA in 2016.  Troponin to 2.6 this admission, could be demand ischemia from septic shock.   - Continue statin.  - No ASA with full anticoagulation.  - Consider eventual cath but would need to see improved renal function.  6. AKI on CKD III - Creatinine 1.8 -> 2.1. Continue to follow closely.  7. LV  thrombus: Chronic warfarin, TEE appeared to show a small calcified LV thrombus.   - Started back on coumadin 10/05/17.   Length of Stay: [address]  Annamaria Helling  10/06/2017, 8:36 AM  Advanced Heart Failure Team Pager [phone] (M-F; 7a - 4p)  Please contact [facility] for night-coverage after hours (4p -7a ) and weekends on [email or web address]  Patient seen with PA, agree with the above note.    Creatinine up today, CVP 6-7.  Will hold off on Lasix today.  Continue  current Coreg.   Continue warfarin/heparin overlap.   We will see again Monday unless called.   Loralie Champagne 10/06/2017 9:02 AM

## 2017-10-06 NOTE — Progress Notes (Signed)
PROGRESS NOTE    Curtis Keller  JFH:[id number] DOB: December 17, 1955 DOA: 09/20/2017 [facility]CP: [facility]   Brief Narrative:  61 yr old WM, SNF resident, with PMHx  COPD(On home oxygen therapy) , MI ,Ischemic cardiomyopathy (EF 93%),[id number] systolic CHF , CAD, Apical mural LV thrombus, HTN, Anemia, Stroke  (12/19/2015) with residual Left hemiplegia, Depression,  Dementia, Anxiety, Cerebral infarction Chronic bronchitis, ,Dysphagia, Falls, Hepatitis C, gout, HLD, Protein calorie malnutrition, Spleen injury Status post tracheostomy ([place]), Diabetes Type II .    Admitted 11/28 with reports of altered mental status for 2 days. Found to have MRSA PNA. Intubated x 3. Patient has also been trached 3, last trach on 12/7.        Subjective: 12/14 patient on full vent support, however nods yes and no appropriately to questions. Negative CP, negative SOB, negative abdominal pain.     Assessment & Plan:   Active Problems:   Chronic systolic CHF (congestive heart failure) (HCC)   Acute respiratory failure (HCC)   Acute systolic (congestive) heart failure (HCC)   Pressure ulcer   Acute respiratory failure with hypoxia (HCC)   Acute hypoxemic respiratory failure (HCC)   MRSA bacteremia   Acute encephalopathy   Clostridium perfringens bacteremia    Tracheostomy status (HCC)   Septic Shock  - volume responsive w/ Clostridium Perfringens and MRSA bacteremia  -Complete course antibiotics  MRSA pneumonia/Acute Lung Injury  -Complete course antibiotics -Bed to percussion TID For minimum of 15 minutes  -DuoNeb PRN   Left Pleural Effusion  -see CHF   Acute Pulmonary Edema  -See CHF   Acute respiratory failure with hypoxia/Ventilator Dependent  -i respiratory failure initially secondary to pneumonia however PCXR from 12/11 appears to be secondary to pulmonary vascular congestion/pleural effusion. -Continue daily weaning trials -Titrate to maintain SPO2 89-93% -PCXR 12/15 -Per [facility]  M note patient will require trach changed to XLT once track more mature (established roughly 81/15)  Chronic Systolic CHF (EF 72%)  -Repeat echocardiogram unchanged EF -Strict in and out since admission +12.4 L -Daily weight Filed Weights   10/04/17 0431 10/05/17 0500 10/06/17 0418  Weight: 209 lb 14.1 oz (95.2 kg) 207 lb 7.3 oz (94.1 kg) 208 lb 12.4 oz (94.7 kg)  -Coreg 9.375 mg BID (per cardiology) -Although patient fluid status still +12 L creatinine trending up with diuresis. Hold on further diuresis, triggering creatinine  -Transfuse for hemoglobin<8  Elevated troponin,  -Most likely demand ischemia. Unfortunately if tissue indicator of cardiac damage patient not candidate for intervention   Tachycardia:  -multiple EKG's show frequent PVC, has had episodes of SVT and even non-sustained VT though less frequent on BB  -Resolved  -see CHF   LEFT  ventricular thrombus,  -Heparin drip--> warfarin therapy per pharmacy 12/14  Anemia (baseline HgB~12-13)  -Occult blood pending -Anemia panel pending Recent Labs  Lab 10/01/17 0500 10/02/17 0426 10/03/17 0512 10/04/17 0429 10/05/17 0500 10/06/17 0414  HGB 9.9* 10.0* 9.5* 9.7* 10.1* 9.8*     Acute on CKD stage III (baseline Cr~1.87)  Recent Labs  Lab 10/01/17 1745 10/02/17 0426 10/03/17 0512 10/04/17 0900 10/05/17 0800 10/06/17 0414  CREATININE 1.92* 1.82* 1.80* 1.83* 1.87* 2.18*  -at baseline  Metabolic encephalopathy  -patient appears to be at baseline: Nods yes and no appropriately to questions. Unknown baseline.   -hold all sedating medication   Dysphasia;  -has chronic trach  Hx of Hep C    Diabetes type 2 uncontrolled with complications  -62/03 Hemoglobin A1c =  10.1     Sacral Decubitus Ulcer  -Treatment per WOC recommendations  -Ensure bed on rotation at all times   -Frequent turning  -Space boots for heel protection  -Foam dressing to sacrum, heels   Mechanical Falls with Prior Hip  Fracture -Monitor / fall precautions  -PT efforts as able      DVT prophylaxis: Heparin drip Code Status: Full Family Communication: None Disposition Plan: LTAC [facility]?   Consultants:  [facility] M Heart failure ID   Procedures/Significant Events:  11/28  Admit 12/03  Extubated  12/04  Re-intubated (suspect medication related) 12/05 Eechocardiogram:-  negative for vegetation -LVEF 20-25% -Left atrium: mildly to moderately dilated.     I have personally reviewed and interpreted all radiology studies and my findings are as above.  VENTILATOR SETTINGS: PRVC FiO2: 40% Set rate: 16 Vt Set 620 ml  PEEP: 5 cm H2O    Cultures 11/28 Sputum culture > MRSA  11/28 BCx2  positive MRSA, C perfringens  11/30 BCx2 negative, final   Antimicrobials: Anti-infectives (From admission, onward)   Start     Dose/Rate Stop   09/29/17 0800  vancomycin (VANCOCIN) 500 mg in sodium chloride 0.9 % 100 mL IVPB     500 mg 100 mL/hr over 60 Minutes 10/05/17 0850   09/22/17 1000  metroNIDAZOLE (FLAGYL) IVPB 500 mg     500 mg 100 mL/hr over 60 Minutes 10/05/17 1815   09/21/17 0600  vancomycin (VANCOCIN) IVPB 750 mg/150 ml premix  Status:  Discontinued     750 mg 150 mL/hr over 60 Minutes 09/29/17 0620   09/20/17 1400  piperacillin-tazobactam (ZOSYN) IVPB 3.375 g  Status:  Discontinued     3.375 g 12.5 mL/hr over 240 Minutes 09/21/17 1004   09/20/17 0800  vancomycin (VANCOCIN) IVPB 750 mg/150 ml premix     750 mg 150 mL/hr over 60 Minutes 09/20/17 2012   09/20/17 0415  ceFEPIme (MAXIPIME) 2 g in dextrose 5 % 50 mL IVPB     2 g 100 mL/hr over 30 Minutes 09/20/17 0512   09/20/17 0415  vancomycin (VANCOCIN) IVPB 1000 mg/200 mL premix     1,000 mg 200 mL/hr over 60 Minutes 09/20/17 0548       Devices    LINES / TUBES:  RUE PICC 12/4 >> ETT 12/2 > 12/3 ETT 12/4 > 12/6 ETT 12/7 > 12/7 Trach [place] 8 cuffed 12/7 >>     Continuous Infusions: . sodium chloride 250 mL (10/05/17  2039)  . feeding supplement (VITAL AF 1.2 CAL) 1,000 mL (10/05/17 1600)  . heparin 2,950 Units/hr (10/06/17 0539)     Objective: Vitals:   10/06/17 0112 10/06/17 0346 10/06/17 0418 10/06/17 0456  BP: 100/68 117/85  103/76  Pulse: 64 70  70  Resp: 16 16  16   Temp:  98.4 F (36.9 C)    TempSrc:  Oral    SpO2: 96% 91%  93%  Weight:   208 lb 12.4 oz (94.7 kg)   Height:        Intake/Output Summary (Last 24 hours) at 10/06/2017 0725 Last data filed at 10/06/2017 0653 Gross per 24 hour  Intake 2577.5 ml  Output 1800 ml  Net 777.5 ml   Filed Weights   10/04/17 0431 10/05/17 0500 10/06/17 0418  Weight: 209 lb 14.1 oz (95.2 kg) 207 lb 7.3 oz (94.1 kg) 208 lb 12.4 oz (94.7 kg)    Physical Exam:  General:  alert, follows all commands, positive acute respiratory distress  Neck:  Negative scars, masses, torticollis, lymphadenopathy, JVD, #8 cuffed trach in place  Lungs:  diffuse coarse breath sounds, negative wheezes, negative crackles  Cardiovascular: Regular rate and rhythm without murmur gallop or rub normal S1 and S2 Abdomen: negative abdominal pain, nondistended, positive soft, bowel sounds, no rebound, no ascites, no appreciable mass Extremities: No significant cyanosis, clubbing, or edema bilateral lower extremities Skin: Negative rashes, lesions, ulcers Psychiatric:   unable to assess secondary to patient being trached  Central nervous system:  Cranial nerves II through XII intact, tongue/uvula midline, all extremities muscle strength 5/5, sensation intact throughout, negative receptive aphasia.   .     Data Reviewed: Care during the described time interval was provided by me .  I have reviewed this patient's available data, including medical history, events of note, physical examination, and all test results as part of my evaluation.   CBC: Recent Labs  Lab 10/02/17 0426 10/03/17 0512 10/04/17 0429 10/05/17 0500 10/06/17 0414  WBC 16.6* 10.9* 9.0 10.4 9.7  HGB  10.0* 9.5* 9.7* 10.1* 9.8*  HCT 31.5* 30.6* 31.1* 31.8* 30.8*  MCV 97.2 97.1 97.8 96.4 96.3  PLT 280 315 321 323 492   Basic Metabolic Panel: Recent Labs  Lab 10/01/17 0500  10/02/17 0426 10/03/17 0512 10/04/17 0900 10/05/17 0800 10/06/17 0414  NA 147*   < > 148* 144 144 142 143  K 3.7   < > 3.5 3.7 4.3 3.9 4.1  CL 113*   < > 113* 111 112* 109 110  CO2 24   < > 28 27 26 26 27   GLUCOSE 113*   < > 118* 119* 104* 99 115*  BUN 68*   < > 65* 60* 57* 59* 60*  CREATININE 2.01*   < > 1.82* 1.80* 1.83* 1.87* 2.18*  CALCIUM 8.8*   < > 8.7* 8.7* 8.7* 8.9 9.0  MG 2.0  --  1.9 2.0 1.9  --  2.1  PHOS 3.2  --  3.4 3.8  --   --   --    < > = values in this interval not displayed.   GFR: Estimated Creatinine Clearance: 41.9 mL/min (A) (by C-G formula based on SCr of 2.18 mg/dL (H)). Liver Function Tests: No results for input(s): AST, ALT, ALKPHOS, BILITOT, PROT, ALBUMIN in the last 168 hours. No results for input(s): LIPASE, AMYLASE in the last 168 hours. No results for input(s): AMMONIA in the last 168 hours. Coagulation Profile: Recent Labs  Lab 10/06/17 0414  INR 1.35   Cardiac Enzymes: No results for input(s): CKTOTAL, CKMB, CKMBINDEX, TROPONINI in the last 168 hours. BNP (last 3 results) No results for input(s): PROBNP in the last 8760 hours. HbA1C: No results for input(s): HGBA1C in the last 72 hours. CBG: Recent Labs  Lab 10/05/17 1337 10/05/17 1700 10/05/17 2002 10/05/17 2335 10/06/17 0343  GLUCAP 108* 95 110* 111* 106*   Lipid Profile: No results for input(s): CHOL, HDL, LDLCALC, TRIG, CHOLHDL, LDLDIRECT in the last 72 hours. Thyroid Function Tests: No results for input(s): TSH, T4TOTAL, FREET4, T3FREE, THYROIDAB in the last 72 hours. Anemia Panel: No results for input(s): VITAMINB12, FOLATE, FERRITIN, TIBC, IRON, RETICCTPCT in the last 72 hours. Urine analysis:    Component Value Date/Time   COLORURINE YELLOW 09/21/2017 1257   APPEARANCEUR HAZY (A) 09/21/2017  1257   LABSPEC 1.024 09/21/2017 1257   PHURINE 5.0 09/21/2017 1257   GLUCOSEU NEGATIVE 09/21/2017 1257   HGBUR LARGE (A) 09/21/2017 1257   BILIRUBINUR NEGATIVE 09/21/2017  [place] 09/21/2017 1257   PROTEINUR 100 (A) 09/21/2017 1257   NITRITE NEGATIVE 09/21/2017 1257   LEUKOCYTESUR NEGATIVE 09/21/2017 1257   Sepsis Labs: @LABRCNTIP (procalcitonin:4,lacticidven:4)  )No results found for this or any previous visit (from the past 240 hour(s)).       Radiology Studies: No results found.      Scheduled Meds: . atorvastatin  40 mg Per Tube QHS  . carvedilol  9.375 mg Per Tube BID WC  . chlorhexidine gluconate (MEDLINE KIT)  15 mL Mouth Rinse BID  . Chlorhexidine Gluconate Cloth  6 each Topical Daily  . collagenase   Topical Daily  . diazepam  2 mg Oral Daily  . famotidine  20 mg Per Tube Daily  . feeding supplement (PRO-STAT SUGAR FREE 64)  30 mL Per Tube Daily  . insulin aspart  0-15 Units Subcutaneous Q4H  . mouth rinse  15 mL Mouth Rinse QID  . multivitamin  15 mL Per Tube Daily  . sodium chloride flush  10-40 mL Intracatheter Q12H   Continuous Infusions: . sodium chloride 250 mL (10/05/17 2039)  . feeding supplement (VITAL AF 1.2 CAL) 1,000 mL (10/05/17 1600)  . heparin 2,950 Units/hr (10/06/17 0539)     LOS: 16 days    Time spent: 40 minutes    Marlys Stegmaier, Geraldo Docker, MD [facility] Pager [phone]   If 7PM-7AM, please contact night-coverage [email or web address] Password [email or web address] 10/06/2017, 7:25 AM

## 2017-10-06 NOTE — Progress Notes (Signed)
Physical Therapy Wound Treatment Patient Details  Name: Curtis Keller MRN: [id number] Date of Birth: Feb 07, 1956  Today's Date: 10/06/2017 Time: 1030-1109 Time Calculation (min): 39 min  Subjective  Subjective: trached, but alert Patient and Family Stated Goals: pt agreed to the hydrotherapy  Pain Score:  2/10, premedicated  Healthy tissue still mostly covered by yellow slough, but a large portion of the yellow eschar/slough has been slowly removed.  Wound Assessment  Pressure Injury 09/20/17 Unstageable - Full thickness tissue loss in which the base of the ulcer is covered by slough (yellow, tan, gray, green or brown) and/or eschar (tan, brown or black) in the wound bed. previous DTI to bilat buttocks and sacrum has  (Active)  Dressing Type ABD;Barrier Film (skin prep);Gauze (Comment);Moist to dry 10/06/2017 11:08 AM  Dressing Clean;Dry;Intact 10/06/2017 11:08 AM  Dressing Change Frequency Daily 10/06/2017 11:08 AM  State of Healing Early/partial granulation 10/06/2017 11:08 AM  Site / Wound Assessment Yellow;Pink 10/06/2017 11:08 AM  % Wound base Red or Granulating 25% 10/06/2017 11:08 AM  % Wound base Yellow/Fibrinous Exudate 75% 10/06/2017 11:08 AM  % Wound base Black/Eschar 0% 10/06/2017 11:08 AM  % Wound base Other/Granulation Tissue (Comment) 0% 10/06/2017 11:08 AM  Peri-wound Assessment Erythema (blanchable) 10/06/2017 11:08 AM  Wound Length (cm) 9 cm 10/05/2017 12:46 PM  Wound Width (cm) 8 cm 10/05/2017 12:46 PM  Wound Depth (cm) 0 cm 10/05/2017 12:46 PM  Wound Surface Area (cm^2) 72 cm^2 10/05/2017 12:46 PM  Wound Volume (cm^3) 0 cm^3 10/05/2017 12:46 PM  Margins Unattached edges (unapproximated) 10/06/2017 11:08 AM  Drainage Amount Minimal 10/06/2017 11:08 AM  Drainage Description Serous 10/06/2017 11:08 AM  Treatment Cleansed;Debridement (Selective);Hydrotherapy (Pulse lavage);Packing (Saline gauze) 10/06/2017 11:08 AM      Hydrotherapy Pulsed lavage therapy -  wound location: sacral Pulsed Lavage with Suction (psi): 8 psi Pulsed Lavage with Suction - Normal Saline Used: 1000 mL Pulsed Lavage Tip: Tip with splash shield Selective Debridement Selective Debridement - Location: sacral Selective Debridement - Tools Used: Forceps;Scalpel Selective Debridement - Tissue Removed: lightly adhered slough/yellow eschar   Wound Assessment and Plan  Wound Therapy - Assess/Plan/Recommendations Wound Therapy - Clinical Statement: pt's wound will benefit from hydro therapy for PLS to decrease bio-burdin and soften eschar for debridement.  At this point expect a large portion to be superficial, but the middle is unstageable. Wound Therapy - Functional Problem List: lack of mobility on trach at present Factors Delaying/Impairing Wound Healing: Immobility;Multiple medical problems Hydrotherapy Plan: Debridement;Dressing change;Pulsatile lavage with suction Wound Therapy - Frequency: 6X / week Wound Therapy - Current Recommendations: PT Wound Therapy - Follow Up Recommendations: Skilled nursing facility Wound Plan: see above  Wound Therapy Goals- Improve the function of patient's integumentary system by progressing the wound(s) through the phases of wound healing (inflammation - proliferation - remodeling) by: Decrease Necrotic Tissue to: 20% Decrease Necrotic Tissue - Progress: Progressing toward goal Increase Granulation Tissue to: 80% Increase Granulation Tissue - Progress: Progressing toward goal Goals/treatment plan/discharge plan were made with and agreed upon by patient/family: Yes Time For Goal Achievement: 7 days Wound Therapy - Potential for Goals: Good  Goals will be updated until maximal potential achieved or discharge criteria met.  Discharge criteria: when goals achieved, discharge from hospital, MD decision/surgical intervention, no progress towards goals, refusal/missing three consecutive treatments without notification or medical reason.  GP      Tessie Fass Kataya Guimont 10/06/2017, 11:09 AM  10/06/2017  Donnella Sham, [place] [phone]  (pager)

## 2017-10-06 NOTE — Progress Notes (Signed)
ANTICOAGULATION CONSULT NOTE Pharmacy Consult:  Heparin/warfarin  Indication:  History of atrial thrombus  Allergies  Allergen Reactions  . Codeine Anaphylaxis and Nausea And Vomiting  . Lisinopril Itching and Rash  . Hydrocodone Other (See Comments)    On MAR  . Hydrocodone-Acetaminophen Nausea And Vomiting    Cannot take any acetaminophen due to liver issues per sister  . Tegaderm Ag Mesh [Silver] Other (See Comments)    Allergy to "silver compounds" listed on MAR  . Tylenol [Acetaminophen] Nausea Only and Other (See Comments)    Cannot take any acetaminophen due to liver issues per sister    Patient Measurements: Height: 6' (182.9 cm) Weight: 208 lb 12.4 oz (94.7 kg) IBW/kg (Calculated) : 77.6  Heparin Dosing weight: 91.8 kg  Vital Signs: Temp: 98.7 F (37.1 C) (12/14 0754) Temp Source: Oral (12/14 0346) BP: 115/81 (12/14 0754) Pulse Rate: 70 (12/14 0824)  Labs: Recent Labs    10/04/17 0429 10/04/17 0900 10/05/17 0500 10/05/17 0800 10/06/17 0414  HGB 9.7*  --  10.1*  --  9.8*  HCT 31.1*  --  31.8*  --  30.8*  PLT 321  --  323  --  294  LABPROT  --   --   --   --  16.6*  INR  --   --   --   --  1.35  HEPARINUNFRC 0.67  --  0.67  --  0.69  CREATININE  --  1.83*  --  1.87* 2.18*     Assessment: 61 y.o. male with h/o atrial thrombus on IV heparin gtt. PTA warfarin restarted 12/14.   Heparin level therapeutic at 0.69 on heparin infusion at 2950 units/hr. CBC stable. Baseline INR 1.35. Pt completed Flagyl therapy on 12/13.  Home warfarin dose: 5 mg daily   Goal of Therapy:  INR 2-3 Heparin level 0.3-0.7 units/ml Monitor platelets by anticoagulation protocol: Yes  Plan:  1. Decrease Heparin gtt slightly to 2850 units/hr 2. Warfarin 5 mg x 1 this evening  3. Daily Heparin level, INR, and CBC while on therapy 4. Stop heparin when INR > 2    Pollyann Samples, PharmD, BCPS 10/06/2017, 8:38 AM Phone # [phone] until 3:30 pm, [phone] afterwards

## 2017-10-06 NOTE — Progress Notes (Signed)
Patient seen for trach team follow up.  All needed equipment at the bedside.  No education needed at this time.  Will continue to follow for progression.  

## 2017-10-06 NOTE — Progress Notes (Signed)
Physical Therapy Treatment Patient Details Name: Curtis Keller MRN: [id number] DOB: [birth year] Today's Date: 10/06/2017    History of Present Illness 61 yr old M, SNF resident,  with PMH of COPD, CHF EF 20%, CAD, apical mural thrombus, right CVA June 2018 admitted 11/28 with reports of altered mental status for 2 days which was thought to be due to UTI. He was altered as per his sister for few days and the morning of admit he started having severe shortness of breath saturating in the 50s with minimal improvemnet on CPAP.  He was intubated in the ED which was very difficult as per the ED physician. Please refer to their note. CXR concerning for bilateral infiltrates, treated with cefepime and vancomycin.     PT Comments    Pt admitted with above diagnosis. Pt currently with functional limitations due to balance and endurance deficits. Pt was able to sit EOB with min guard assist to mod assist at times due to sore buttocks.  Pt impulsive at times.  Hydro came to see pt therefore left pt in bed and they came in to treat pt.   Pt will benefit from skilled PT to increase their independence and safety with mobility to allow discharge to the venue listed below.     Follow Up Recommendations  SNF;Supervision/Assistance - 24 hour     Equipment Recommendations  Other (comment)(TBA)    Recommendations for Other Services       Precautions / Restrictions Precautions Precautions: Fall Restrictions Weight Bearing Restrictions: No    Mobility  Bed Mobility Overal bed mobility: Needs Assistance Bed Mobility: Rolling;Sidelying to Sit Rolling: Mod assist;+2 for safety/equipment Sidelying to sit: Mod assist;+2 for physical assistance;+2 for safety/equipment       General bed mobility comments: Pt with BM on arrival and had soiled the sacral dressing therefore PT had to remove as hydro was coming up shortly anyway.  Hydro arrived at end of PT session.  Moderate assist to come to upright at EOB,  2nd person for safety and line management as patient impulsive at times  Transfers                 General transfer comment: Limited to EOB reaching and sitting balance.    Ambulation/Gait                 Stairs            Wheelchair Mobility    Modified Rankin (Stroke Patients Only)       Balance Overall balance assessment: Needs assistance;History of Falls Sitting-balance support: Single extremity supported;Feet supported Sitting balance-Leahy Scale: Poor Sitting balance - Comments: right lateral lean initially, hands on assist provided improved with EOB activity.  Pt sat a total of 10 mnutes and needed min guard assist at times but at times mod assist as he is impulsive when buttocks hurts and he is trying to [employer].  Postural control: Right lateral lean     Standing balance comment: unable to stand                            Cognition Arousal/Alertness: Awake/alert Behavior During Therapy: [facility] for tasks assessed/performed;Impulsive Overall Cognitive Status: Impaired/Different from baseline Area of Impairment: Safety/judgement;Problem solving;Awareness;Attention;Following commands                   Current Attention Level: Focused   Following Commands: Follows one step commands with increased time Safety/Judgement: Decreased  awareness of deficits;Decreased awareness of safety   Problem Solving: Slow processing;Decreased initiation;Difficulty sequencing;Requires verbal cues;Requires tactile cues        Exercises General Exercises - Lower Extremity Ankle Circles/Pumps: AROM;Both;Supine;10 reps Long Arc Quad: AROM;Both;5 reps;Seated    General Comments General comments (skin integrity, edema, etc.): buttock wound      Pertinent Vitals/Pain Pain Assessment: Faces Faces Pain Scale: Hurts whole lot Pain Location: buttocks and bil heels Pain Descriptors / Indicators: Aching;Grimacing;Guarding Pain Intervention(s): Limited  activity within patient's tolerance;Monitored during session;Premedicated before session;Repositioned   VSS Home Living                      Prior Function            PT Goals (current goals can now be found in the care plan section) Acute Rehab PT Goals Patient Stated Goal: to get better Progress towards PT goals: Progressing toward goals    Frequency    Min 2X/week      PT Plan Current plan remains appropriate    Co-evaluation              AM-PAC PT "6 Clicks" Daily Activity  Outcome Measure  Difficulty turning over in bed (including adjusting bedclothes, sheets and blankets)?: Unable Difficulty moving from lying on back to sitting on the side of the bed? : Unable Difficulty sitting down on and standing up from a chair with arms (e.g., wheelchair, bedside commode, etc,.)?: Unable Help needed moving to and from a bed to chair (including a wheelchair)?: Total Help needed walking in hospital room?: Total Help needed climbing 3-5 steps with a railing? : Total 6 Click Score: 6    End of Session Equipment Utilized During Treatment: Gait belt;Oxygen Activity Tolerance: Patient limited by fatigue Patient left: in bed;with call bell/phone within reach;with bed alarm set;with restraints reapplied(mitts applied) Nurse Communication: Mobility status;Need for lift equipment PT Visit Diagnosis: Unsteadiness on feet (R26.81);Muscle weakness (generalized) (M62.81);Pain Pain - part of body: (buttocks)     Time: [id number] PT Time Calculation (min) (ACUTE ONLY): 25 min  Charges:  $Therapeutic Activity: 23-37 mins                    G Codes:       Lewie Deman,PT Acute Rehabilitation [phone] (pager)    Berline Lopesawn F Jensine Luz 10/06/2017, 1:13 PM

## 2017-10-06 NOTE — Progress Notes (Signed)
Late Entry: Tammy from [facility] came by to speak with staff/ family on 19M unit 10/05/17. CSW spoke with LCSW to hand off on pt. At this time this CSW is signing off.     Claude Manges [place], MSW, LCSW-A Emergency Department Clinical Social Worker [phone]

## 2017-10-07 ENCOUNTER — Inpatient Hospital Stay (HOSPITAL_COMMUNITY): Payer: Medicare Other

## 2017-10-07 DIAGNOSIS — J9819 Other pulmonary collapse: Secondary | ICD-10-CM

## 2017-10-07 DIAGNOSIS — D638 Anemia in other chronic diseases classified elsewhere: Secondary | ICD-10-CM

## 2017-10-07 LAB — MAGNESIUM: MAGNESIUM: 2.1 mg/dL (ref 1.7–2.4)

## 2017-10-07 LAB — GLUCOSE, CAPILLARY
GLUCOSE-CAPILLARY: 102 mg/dL — AB (ref 65–99)
GLUCOSE-CAPILLARY: 114 mg/dL — AB (ref 65–99)
Glucose-Capillary: 113 mg/dL — ABNORMAL HIGH (ref 65–99)
Glucose-Capillary: 101 mg/dL — ABNORMAL HIGH (ref 65–99)
Glucose-Capillary: 107 mg/dL — ABNORMAL HIGH (ref 65–99)
Glucose-Capillary: 113 mg/dL — ABNORMAL HIGH (ref 65–99)

## 2017-10-07 LAB — CBC
HEMATOCRIT: 30.6 % — AB (ref 39.0–52.0)
HEMOGLOBIN: 9.6 g/dL — AB (ref 13.0–17.0)
MCH: 30.1 pg (ref 26.0–34.0)
MCHC: 31.4 g/dL (ref 30.0–36.0)
MCV: 95.9 fL (ref 78.0–100.0)
PLATELETS: 285 10*3/uL (ref 150–400)
RBC: 3.19 MIL/uL — AB (ref 4.22–5.81)
RDW: 15.4 % (ref 11.5–15.5)
WBC: 10.9 10*3/uL — ABNORMAL HIGH (ref 4.0–10.5)

## 2017-10-07 LAB — BASIC METABOLIC PANEL
ANION GAP: 7 (ref 5–15)
BUN: 65 mg/dL — ABNORMAL HIGH (ref 6–20)
CALCIUM: 8.8 mg/dL — AB (ref 8.9–10.3)
CO2: 26 mmol/L (ref 22–32)
Chloride: 108 mmol/L (ref 101–111)
Creatinine, Ser: 2.36 mg/dL — ABNORMAL HIGH (ref 0.61–1.24)
GFR, EST AFRICAN AMERICAN: 32 mL/min — AB (ref >60)
GFR, EST NON AFRICAN AMERICAN: 28 mL/min — AB (ref >60)
Glucose, Bld: 105 mg/dL — ABNORMAL HIGH (ref 65–99)
Potassium: 3.9 mmol/L (ref 3.5–5.1)
Sodium: 141 mmol/L (ref 135–145)

## 2017-10-07 LAB — PROTIME-INR
INR: 1.32
Prothrombin Time: 16.3 seconds — ABNORMAL HIGH (ref 11.4–15.2)

## 2017-10-07 LAB — HEPARIN LEVEL (UNFRACTIONATED): Heparin Unfractionated: 0.69 IU/mL (ref 0.30–0.70)

## 2017-10-07 MED ORDER — HEPARIN (PORCINE) IN NACL 100-0.45 UNIT/ML-% IJ SOLN
INTRAMUSCULAR | Status: AC
  Filled 2017-10-07: qty 250

## 2017-10-07 MED ORDER — WARFARIN SODIUM 7.5 MG PO TABS
7.5000 mg | ORAL_TABLET | Freq: Once | ORAL | Status: AC
  Administered 2017-10-07: 7.5 mg via ORAL
  Filled 2017-10-07: qty 1

## 2017-10-07 MED ORDER — FUROSEMIDE 10 MG/ML IJ SOLN
80.0000 mg | Freq: Once | INTRAMUSCULAR | Status: AC
  Administered 2017-10-07: 80 mg via INTRAVENOUS
  Filled 2017-10-07: qty 8

## 2017-10-07 NOTE — Progress Notes (Signed)
Pt heparin stopped at 1230 by IV team due to radiographs showing picc line displaced. Restarted at 1330 after picc readjusted by IV team

## 2017-10-07 NOTE — Progress Notes (Signed)
RT placed patient back on ventilator on full support as he was desating in the mid 80's on ATC. Vitals are stable and sats are now 92%. RT will continue to monitor.

## 2017-10-07 NOTE — Progress Notes (Signed)
ANTICOAGULATION CONSULT NOTE Pharmacy Consult:  Heparin/warfarin  Indication:  History of atrial thrombus  Allergies  Allergen Reactions  . Codeine Anaphylaxis and Nausea And Vomiting  . Lisinopril Itching and Rash  . Hydrocodone Other (See Comments)    On MAR  . Hydrocodone-Acetaminophen Nausea And Vomiting    Cannot take any acetaminophen due to liver issues per sister  . Tegaderm Ag Mesh [Silver] Other (See Comments)    Allergy to "silver compounds" listed on MAR  . Tylenol [Acetaminophen] Nausea Only and Other (See Comments)    Cannot take any acetaminophen due to liver issues per sister    Patient Measurements: Height: 6' (182.9 cm) Weight: 209 lb 3.5 oz (94.9 kg) IBW/kg (Calculated) : 77.6  Heparin Dosing weight: 91.8 kg  Vital Signs: Temp: 98.2 F (36.8 C) (12/15 0807) Temp Source: Oral (12/15 0807) BP: 104/74 (12/15 1100) Pulse Rate: 92 (12/15 1100)  Labs: Recent Labs    10/05/17 0500 10/05/17 0800 10/06/17 0414 10/07/17 0522  HGB 10.1*  --  9.8* 9.6*  HCT 31.8*  --  30.8* 30.6*  PLT 323  --  294 285  LABPROT  --   --  16.6* 16.3*  INR  --   --  1.35 1.32  HEPARINUNFRC 0.67  --  0.69 0.69  CREATININE  --  1.87* 2.18* 2.36*    Assessment: 61 y.o. male with h/o atrial thrombus on IV heparin gtt. PTA warfarin restarted 12/14.   Heparin level remains therapeutic at 0.69 on heparin infusion at 2850 units/hr. CBC stable. Today INR 1.32.  Coumadin just resumed on 12/14.  Pt completed Flagyl therapy on 12/13. Hgb on admit was 12.1 , declined to 10s >>9.6 (12/15) Ptlc wnl  No bleeding noted  Home warfarin dose: 5 mg daily ;   (INR on admit 11/28 was 1.77)   Goal of Therapy:  INR 2-3 Heparin level 0.3-0.7 units/ml Monitor platelets by anticoagulation protocol: Yes  Plan:  1. Continue Heparin gtt  2850 units/h 2. Warfarin 7.5 mg x 1 this evening  3. Daily Heparin level, INR, and CBC while on therapy 4. Stop heparin when INR > 2    Noah Delaine,  [place] Clinical Pharmacist [id number]-[id number] [phone] After 4PM call Main Pharmacy [phone] 10/07/2017, 11:32 AM

## 2017-10-07 NOTE — Progress Notes (Signed)
Physical Therapy Wound Treatment Patient Details  Name: Curtis Keller MRN: [id number] Date of Birth: 10/25/1955  Today's Date: 10/07/2017 Time: 1124-1202 Time Calculation (min): 38 min  Subjective  Subjective: trached, but alert Patient and Family Stated Goals: pt agreed to the hydrotherapy  Pain Score:  Unable to state.  Moved away during PLS.  Wound Assessment  Pressure Injury 09/20/17 Unstageable - Full thickness tissue loss in which the base of the ulcer is covered by slough (yellow, tan, gray, green or brown) and/or eschar (tan, brown or black) in the wound bed. previous DTI to bilat buttocks and sacrum has  (Active)  Dressing Type ABD;Barrier Film (skin prep);Gauze (Comment);Moist to dry 10/07/2017  6:03 PM  Dressing Clean;Dry;Intact 10/07/2017  6:03 PM  Dressing Change Frequency Daily 10/07/2017  6:03 PM  State of Healing Early/partial granulation 10/07/2017  6:03 PM  Site / Wound Assessment Yellow;Pink 10/07/2017  6:03 PM  % Wound base Red or Granulating 25% 10/07/2017  6:03 PM  % Wound base Yellow/Fibrinous Exudate 75% 10/07/2017  6:03 PM  % Wound base Black/Eschar 0% 10/07/2017  6:03 PM  % Wound base Other/Granulation Tissue (Comment) 0% 10/07/2017  6:03 PM  Peri-wound Assessment Erythema (blanchable) 10/07/2017  6:03 PM  Wound Length (cm) 9 cm 10/05/2017 12:46 PM  Wound Width (cm) 8 cm 10/05/2017 12:46 PM  Wound Depth (cm) 0 cm 10/05/2017 12:46 PM  Wound Surface Area (cm^2) 72 cm^2 10/05/2017 12:46 PM  Wound Volume (cm^3) 0 cm^3 10/05/2017 12:46 PM  Margins Unattached edges (unapproximated) 10/07/2017  6:03 PM  Drainage Amount Minimal 10/07/2017  6:03 PM  Drainage Description Serous 10/07/2017  6:03 PM  Treatment Cleansed;Debridement (Selective);Hydrotherapy (Pulse lavage) 10/07/2017  6:03 PM      Hydrotherapy Pulsed lavage therapy - wound location: sacral Pulsed Lavage with Suction (psi): 8 psi Pulsed Lavage with Suction - Normal Saline Used: 1000 mL Pulsed  Lavage Tip: Tip with splash shield Selective Debridement Selective Debridement - Location: sacral Selective Debridement - Tools Used: Forceps;Scalpel Selective Debridement - Tissue Removed: lightly adhered slough/yellow eschar   Wound Assessment and Plan  Wound Therapy - Assess/Plan/Recommendations Wound Therapy - Clinical Statement: Wound will benefit from hydrotherapy/debridement to facilitate healing. Wound Therapy - Functional Problem List: lack of mobility on trach at present Factors Delaying/Impairing Wound Healing: Immobility;Multiple medical problems Hydrotherapy Plan: Debridement;Dressing change;Pulsatile lavage with suction Wound Therapy - Frequency: 6X / week Wound Therapy - Follow Up Recommendations: Skilled nursing facility Wound Plan: see above  Wound Therapy Goals- Improve the function of patient's integumentary system by progressing the wound(s) through the phases of wound healing (inflammation - proliferation - remodeling) by: Decrease Necrotic Tissue to: 20% Decrease Necrotic Tissue - Progress: Progressing toward goal Increase Granulation Tissue to: 80% Increase Granulation Tissue - Progress: Progressing toward goal Goals/treatment plan/discharge plan were made with and agreed upon by patient/family: Yes Time For Goal Achievement: 7 days Wound Therapy - Potential for Goals: Good  Goals will be updated until maximal potential achieved or discharge criteria met.  Discharge criteria: when goals achieved, discharge from hospital, MD decision/surgical intervention, no progress towards goals, refusal/missing three consecutive treatments without notification or medical reason.  GP     Despina Pole 10/07/2017, 6:11 PM  Carita Pian. Sanjuana Kava, [place] Pager [phone]

## 2017-10-07 NOTE — Plan of Care (Signed)
Discussed with pt importance of not removing his vent, pt is being cooperative

## 2017-10-07 NOTE — Progress Notes (Addendum)
Pt stats are 79-88 called RT to put pt back on vent

## 2017-10-07 NOTE — Progress Notes (Addendum)
PROGRESS NOTE    JWAN HORNBAKER  HYQ:[id number] DOB: 07-26-1956 DOA: 09/20/2017 PCP: [facility]   Brief Narrative:  61 yr old WM, SNF resident, with PMHx  COPD(On home oxygen therapy) , MI ,Ischemic cardiomyopathy (EF 95%),MWUXLKG systolic CHF , CAD, Apical mural LV thrombus, HTN, Anemia, Stroke  (12/19/2015) with residual Left hemiplegia, Depression,  Dementia, Anxiety, Cerebral infarction Chronic bronchitis, ,Dysphagia, Falls, Hepatitis C, gout, HLD, Protein calorie malnutrition, Spleen injury Status post tracheostomy ([place]), Diabetes Type II .    Admitted 11/28 with reports of altered mental status for 2 days. Found to have MRSA PNA. Intubated x 3. Patient has also been trached 3, last trach on 12/7.        Subjective: 12/15 patient alert, nods yes and no appropriately to questions. Mouths over vent having back pain (just completed wound therapy). Negative abdominal pain, negative N/V.     Assessment & Plan:   Active Problems:   Chronic systolic CHF (congestive heart failure) (HCC)   Acute respiratory failure (HCC)   Acute systolic (congestive) heart failure (HCC)   Pressure ulcer   Acute respiratory failure with hypoxia (HCC)   Acute hypoxemic respiratory failure (HCC)   MRSA bacteremia   Acute encephalopathy   Clostridium perfringens bacteremia    Tracheostomy status (HCC)   Septic Shock  - volume responsive w/ Clostridium Perfringens and MRSA bacteremia  -Complete course antibiotics  MRSA pneumonia/Acute Lung Injury  -Complete course antibiotics -Bed to percussion TID For minimum of 15 minutes  -DuoNeb PRN  Bilateral Lower Lobe Collapse/Consolidation -Obstruction? Obtain CT chest 12/16   Bilateral Pleural Effusion LEFT>> RIGHT -see CHF   Acute Pulmonary Edema  -See CHF   Acute respiratory failure with hypoxia/Ventilator Dependent  -i respiratory failure initially secondary to pneumonia however PCXR from 12/11 appears to be secondary to  pulmonary vascular congestion/pleural effusion. -Continue daily weaning trials -Titrate O2 to maintain SPO2 89-93% -12/15 tolerated trach collar for~90 minutes -PCXR 12/15 -Per [facility] note patient will require trach changed to XLT once track more mature (established roughly 12/14)  PICC line Malpositioning -On 12/16 contact PICC line team PICC line will have to be removed or repositioned. Currently has flipped into the RIGHT IJ  Chronic Systolic CHF (EF 40%)  -Repeat echocardiogram unchanged EF -Strict in and out since admission +13.3 L -Daily weight Filed Weights   10/05/17 0500 10/06/17 0418 10/07/17 0423  Weight: 207 lb 7.3 oz (94.1 kg) 208 lb 12.4 oz (94.7 kg) 209 lb 3.5 oz (94.9 kg)  -Coreg 9.375 mg BID (per cardiology) -Fluid status > 13 L positive since admission. Try Lasix 80 mg one-time dose if creatinine begins to improve will continue judicious diuresis.  -Transfuse for hemoglobin<8  Elevated troponin,  -Most likely demand ischemia. Unfortunately if tissue indicator of cardiac damage patient not candidate for intervention   Tachycardia:  -multiple EKG's show frequent PVC, has had episodes of SVT and even non-sustained VT though less frequent on BB  -Resolved  -See CHF   LEFT  ventricular thrombus,  -Heparin drip--> warfarin therapy per pharmacy 12/14 Recent Labs  Lab 10/06/17 0414 10/07/17 0522 10/08/17 0344  INR 1.35 1.32 1.35  -still subtherapeutic  Anemia of Chronic Disease (baseline HgB~12-13)  -Occult blood pending -Anemia panel most consistent with anemia of chronic disease  Recent Labs  Lab 10/02/17 0426 10/03/17 0512 10/04/17 0429 10/05/17 0500 10/06/17 0414 10/07/17 0522  HGB 10.0* 9.5* 9.7* 10.1* 9.8* 9.6*     Acute on CKD stage  III (baseline Cr~1.87)  Recent Labs  Lab 10/02/17 0426 10/03/17 0512 10/04/17 0900 10/05/17 0800 10/06/17 0414 10/07/17 0522  CREATININE 1.82* 1.80* 1.83* 1.87* 2.18* 2.36*  -at baseline  Metabolic  encephalopathy  -patient appears to be at baseline: Nods yes and no appropriately to questions. Unknown baseline.   -hold all sedating medication   Dysphasia;  -has chronic trach  Hx of Hep C -HCV quantitative negative   Diabetes type 2 uncontrolled with complications  -37/48 Hemoglobin A1c = 10.1   Sacral Decubitus Ulcer  -Treatment per WOC recommendations  -Ensure been on rotation at all times   -Frequent turning  - Endotracheal protection -Foam dressing to sacrum/heels   Mechanical Falls with Prior Hip Fracture -Monitor / fall precautions  -PT efforts as able      DVT prophylaxis: Heparin drip--> warfarin per pharmacy Code Status: Full Family Communication: None Disposition Plan: LTAC [facility]?   Consultants:  [facility] Heart failure ID   Procedures/Significant Events:  11/28  Admit 12/03  Extubated  12/04  Re-intubated (suspect medication related) 12/05 Eechocardiogram:-  negative for vegetation -LVEF 20-25% -Left atrium: mildly to moderately dilated.  12/15 PCXR-Recurrent malpositioning of the right upper extremity PICC line, flipped into the right IJ.  - Pulmonary edema appears mildly progressed since 10/03/2017. -Continued pleural effusions, LEFT >> RIGHT  -bilateral lower lobe collapse or consolidation.    I have personally reviewed and interpreted all radiology studies and my findings are as above.  VENTILATOR SETTINGS: PRVC FiO2: 40% Set rate: 16 Vt Set 620 ml  PEEP: 5 cm H2O    Cultures 11/28 Sputum culture positive MRSA  11/28 BCx2  positive MRSA, C perfringens  11/30 BCx2 negative, final   Antimicrobials: Anti-infectives (From admission, onward)   Start     Dose/Rate Stop   09/29/17 0800  vancomycin (VANCOCIN) 500 mg in sodium chloride 0.9 % 100 mL IVPB     500 mg 100 mL/hr over 60 Minutes 10/05/17 0850   09/22/17 1000  metroNIDAZOLE (FLAGYL) IVPB 500 mg     500 mg 100 mL/hr over 60 Minutes 10/05/17 1815   09/21/17 0600  vancomycin  (VANCOCIN) IVPB 750 mg/150 ml premix  Status:  Discontinued     750 mg 150 mL/hr over 60 Minutes 09/29/17 0620   09/20/17 1400  piperacillin-tazobactam (ZOSYN) IVPB 3.375 g  Status:  Discontinued     3.375 g 12.5 mL/hr over 240 Minutes 09/21/17 1004   09/20/17 0800  vancomycin (VANCOCIN) IVPB 750 mg/150 ml premix     750 mg 150 mL/hr over 60 Minutes 09/20/17 2012   09/20/17 0415  ceFEPIme (MAXIPIME) 2 g in dextrose 5 % 50 mL IVPB     2 g 100 mL/hr over 30 Minutes 09/20/17 0512   09/20/17 0415  vancomycin (VANCOCIN) IVPB 1000 mg/200 mL premix     1,000 mg 200 mL/hr over 60 Minutes 09/20/17 0548       Devices    LINES / TUBES:  RUE PICC 12/4 >> ETT 12/2 > 12/3 ETT 12/4 > 12/6 ETT 12/7 > 12/7 Trach [place]iley 8 cuffed 12/7 >>     Continuous Infusions: . sodium chloride 250 mL (10/05/17 2039)  . feeding supplement (VITAL AF 1.2 CAL) 1,000 mL (10/07/17 0810)  . heparin    . heparin 2,850 Units/hr (10/07/17 1100)     Objective: Vitals:   10/07/17 1019 10/07/17 1100 10/07/17 1132 10/07/17 1133  BP: 108/73 104/74 104/74   Pulse: 80 92 92   Resp:  20 (!) 21 (!) 21   Temp:      TempSrc:      SpO2: 92% 90% 91% 92%  Weight:      Height:        Intake/Output Summary (Last 24 hours) at 10/07/2017 1156 Last data filed at 10/07/2017 1100 Gross per 24 hour  Intake 2861.9 ml  Output 775 ml  Net 2086.9 ml   Filed Weights   10/05/17 0500 10/06/17 0418 10/07/17 0423  Weight: 207 lb 7.3 oz (94.1 kg) 208 lb 12.4 oz (94.7 kg) 209 lb 3.5 oz (94.9 kg)    Physical Exam:  General: Alert, follows all commands, positive acute respiratory distress Neck:  Negative scars, masses, torticollis, lymphadenopathy, JVD, #8 cuffed trach in place (patient talking over cuff) Lungs: diffuse coarse breath sounds, negative wheezes, negative crackles  Cardiovascular: Regular rate and rhythm without murmur gallop or rub normal S1 and S2 Abdomen: negative abdominal pain, nondistended, positive  soft, bowel sounds, no rebound, no ascites, no appreciable mass Extremities: No significant cyanosis, clubbing, or edema bilateral lower extremities Skin: Negative rashes, lesions, ulcers Psychiatric:  Unable to assess secondary to patient being on trach   Central nervous system:  Cranial nerves II through XII intact, tongue/uvula midline, all extremities muscle strength 5/5, sensation intact throughout, negative dysarthria, negative expressive aphasia, negative receptive aphasia.     .     Data Reviewed: Care during the described time interval was provided by me .  I have reviewed this patient's available data, including medical history, events of note, physical examination, and all test results as part of my evaluation.   CBC: Recent Labs  Lab 10/03/17 0512 10/04/17 0429 10/05/17 0500 10/06/17 0414 10/07/17 0522  WBC 10.9* 9.0 10.4 9.7 10.9*  HGB 9.5* 9.7* 10.1* 9.8* 9.6*  HCT 30.6* 31.1* 31.8* 30.8* 30.6*  MCV 97.1 97.8 96.4 96.3 95.9  PLT 315 321 323 294 117   Basic Metabolic Panel: Recent Labs  Lab 10/01/17 0500  10/02/17 0426 10/03/17 0512 10/04/17 0900 10/05/17 0800 10/06/17 0414 10/07/17 0522  NA 147*   < > 148* 144 144 142 143 141  K 3.7   < > 3.5 3.7 4.3 3.9 4.1 3.9  CL 113*   < > 113* 111 112* 109 110 108  CO2 24   < > 28 27 26 26 27 26   GLUCOSE 113*   < > 118* 119* 104* 99 115* 105*  BUN 68*   < > 65* 60* 57* 59* 60* 65*  CREATININE 2.01*   < > 1.82* 1.80* 1.83* 1.87* 2.18* 2.36*  CALCIUM 8.8*   < > 8.7* 8.7* 8.7* 8.9 9.0 8.8*  MG 2.0  --  1.9 2.0 1.9  --  2.1 2.1  PHOS 3.2  --  3.4 3.8  --   --   --   --    < > = values in this interval not displayed.   GFR: Estimated Creatinine Clearance: 38.8 mL/min (A) (by C-G formula based on SCr of 2.36 mg/dL (H)). Liver Function Tests: No results for input(s): AST, ALT, ALKPHOS, BILITOT, PROT, ALBUMIN in the last 168 hours. No results for input(s): LIPASE, AMYLASE in the last 168 hours. No results for  input(s): AMMONIA in the last 168 hours. Coagulation Profile: Recent Labs  Lab 10/06/17 0414 10/07/17 0522  INR 1.35 1.32   Cardiac Enzymes: No results for input(s): CKTOTAL, CKMB, CKMBINDEX, TROPONINI in the last 168 hours. BNP (last 3 results) No results for input(s):  PROBNP in the last 8760 hours. HbA1C: No results for input(s): HGBA1C in the last 72 hours. CBG: Recent Labs  Lab 10/06/17 1600 10/06/17 2038 10/07/17 0115 10/07/17 0422 10/07/17 0826  GLUCAP 135* 83 113* 101* 102*   Lipid Profile: No results for input(s): CHOL, HDL, LDLCALC, TRIG, CHOLHDL, LDLDIRECT in the last 72 hours. Thyroid Function Tests: No results for input(s): TSH, T4TOTAL, FREET4, T3FREE, THYROIDAB in the last 72 hours. Anemia Panel: Recent Labs    10/06/17 1954  VITAMINB12 641  FOLATE 45.6  FERRITIN 25  TIBC 216*  IRON 19*  RETICCTPCT 1.9   Urine analysis:    Component Value Date/Time   COLORURINE YELLOW 09/21/2017 1257   APPEARANCEUR HAZY (A) 09/21/2017 1257   LABSPEC 1.024 09/21/2017 1257   PHURINE 5.0 09/21/2017 1257   GLUCOSEU NEGATIVE 09/21/2017 1257   HGBUR LARGE (A) 09/21/2017 1257   BILIRUBINUR NEGATIVE 09/21/2017 1257   KETONESUR NEGATIVE 09/21/2017 1257   PROTEINUR 100 (A) 09/21/2017 1257   NITRITE NEGATIVE 09/21/2017 1257   LEUKOCYTESUR NEGATIVE 09/21/2017 1257   Sepsis Labs: '@LABRCNTIP'$ (procalcitonin:4,lacticidven:4)  )No results found for this or any previous visit (from the past 240 hour(s)).       Radiology Studies: Dg Chest Port 1 View  Result Date: 10/07/2017 CLINICAL DATA:  61 year old male with fluid overload, CHF. EXAM: PORTABLE CHEST 1 VIEW COMPARISON:  10/03/2017 and earlier. FINDINGS: Portable AP semi upright view at 0752 hours. Stable tracheostomy tube. However, the right upper extremity approach PICC line catheter has flipped into the right IJ. Stable cardiomegaly and mediastinal contours. Continued dense retrocardiac opacity. Increased indistinct  appearance of bilateral pulmonary vascularity and mildly increased ground-glass type bilateral pulmonary opacity. No pneumothorax. Small pleural effusions suspected, greater on the left. IMPRESSION: 1. Recurrent malpositioning of the right upper extremity PICC line, flipped into the right IJ. This should be repositioned or replaced to avoid right internal jugular vein complications. 2. Pulmonary edema appears mildly progressed since 10/03/2017. Continued pleural effusions, greater on the left, and bilateral lower lobe collapse or consolidation. These results will be called to the ordering clinician or representative by the Radiologist Assistant, and communication documented in the PACS or zVision Dashboard. Electronically Signed   By: Genevie Ann M.D.   On: 10/07/2017 08:21        Scheduled Meds: . atorvastatin  40 mg Per Tube QHS  . carvedilol  9.375 mg Per Tube BID WC  . chlorhexidine gluconate (MEDLINE KIT)  15 mL Mouth Rinse BID  . Chlorhexidine Gluconate Cloth  6 each Topical Daily  . collagenase   Topical Daily  . diazepam  2 mg Oral Daily  . famotidine  20 mg Per Tube Daily  . feeding supplement (PRO-STAT SUGAR FREE 64)  30 mL Per Tube Daily  . insulin aspart  0-15 Units Subcutaneous Q4H  . mouth rinse  15 mL Mouth Rinse QID  . multivitamin  15 mL Per Tube Daily  . sodium chloride flush  10-40 mL Intracatheter Q12H  . warfarin  7.5 mg Oral ONCE-1800  . Warfarin - Pharmacist Dosing Inpatient   Does not apply q1800   Continuous Infusions: . sodium chloride 250 mL (10/05/17 2039)  . feeding supplement (VITAL AF 1.2 CAL) 1,000 mL (10/07/17 0810)  . heparin    . heparin 2,850 Units/hr (10/07/17 1100)     LOS: 17 days    Time spent: 40 minutes    WOODS, Geraldo Docker, MD [facility] Pager [phone]   If 7PM-7AM, please contact  night-coverage [email or web address] Password [email or web address] 10/07/2017, 11:56 AM

## 2017-10-08 ENCOUNTER — Inpatient Hospital Stay (HOSPITAL_COMMUNITY): Payer: Medicare Other

## 2017-10-08 DIAGNOSIS — J9811 Atelectasis: Secondary | ICD-10-CM

## 2017-10-08 LAB — GLUCOSE, CAPILLARY
GLUCOSE-CAPILLARY: 106 mg/dL — AB (ref 65–99)
GLUCOSE-CAPILLARY: 109 mg/dL — AB (ref 65–99)
GLUCOSE-CAPILLARY: 114 mg/dL — AB (ref 65–99)
GLUCOSE-CAPILLARY: 115 mg/dL — AB (ref 65–99)
GLUCOSE-CAPILLARY: 122 mg/dL — AB (ref 65–99)
GLUCOSE-CAPILLARY: 126 mg/dL — AB (ref 65–99)
Glucose-Capillary: 117 mg/dL — ABNORMAL HIGH (ref 65–99)

## 2017-10-08 LAB — BASIC METABOLIC PANEL
Anion gap: 8 (ref 5–15)
BUN: 65 mg/dL — AB (ref 6–20)
CO2: 27 mmol/L (ref 22–32)
Calcium: 9.1 mg/dL (ref 8.9–10.3)
Chloride: 108 mmol/L (ref 101–111)
Creatinine, Ser: 2.3 mg/dL — ABNORMAL HIGH (ref 0.61–1.24)
GFR calc Af Amer: 33 mL/min — ABNORMAL LOW (ref >60)
GFR, EST NON AFRICAN AMERICAN: 29 mL/min — AB (ref >60)
GLUCOSE: 104 mg/dL — AB (ref 65–99)
POTASSIUM: 4 mmol/L (ref 3.5–5.1)
Sodium: 143 mmol/L (ref 135–145)

## 2017-10-08 LAB — PROTIME-INR
INR: 1.35
Prothrombin Time: 16.5 seconds — ABNORMAL HIGH (ref 11.4–15.2)

## 2017-10-08 LAB — HEPARIN LEVEL (UNFRACTIONATED)
Heparin Unfractionated: 0.78 IU/mL — ABNORMAL HIGH (ref 0.30–0.70)
Heparin Unfractionated: 0.33 IU/mL (ref 0.30–0.70)

## 2017-10-08 LAB — CBC
HEMATOCRIT: 31.5 % — AB (ref 39.0–52.0)
Hemoglobin: 10 g/dL — ABNORMAL LOW (ref 13.0–17.0)
MCH: 30.6 pg (ref 26.0–34.0)
MCHC: 31.7 g/dL (ref 30.0–36.0)
MCV: 96.3 fL (ref 78.0–100.0)
PLATELETS: 300 10*3/uL (ref 150–400)
RBC: 3.27 MIL/uL — AB (ref 4.22–5.81)
RDW: 15.3 % (ref 11.5–15.5)
WBC: 9.6 10*3/uL (ref 4.0–10.5)

## 2017-10-08 LAB — MAGNESIUM: MAGNESIUM: 2.2 mg/dL (ref 1.7–2.4)

## 2017-10-08 MED ORDER — ACETYLCYSTEINE 20 % IN SOLN
4.0000 mL | Freq: Three times a day (TID) | RESPIRATORY_TRACT | Status: DC
  Administered 2017-10-08 – 2017-10-10 (×6): 4 mL via RESPIRATORY_TRACT
  Filled 2017-10-08 (×6): qty 4

## 2017-10-08 MED ORDER — ACETYLCYSTEINE 20 % IN SOLN: 4.0000 mL | Freq: Three times a day (TID) | RESPIRATORY_TRACT | Status: DC

## 2017-10-08 MED ORDER — FUROSEMIDE 10 MG/ML IJ SOLN
60.0000 mg | Freq: Two times a day (BID) | INTRAMUSCULAR | Status: DC
  Administered 2017-10-08 – 2017-10-09 (×2): 60 mg via INTRAVENOUS
  Filled 2017-10-08 (×2): qty 6

## 2017-10-08 MED ORDER — ALBUTEROL SULFATE (2.5 MG/3ML) 0.083% IN NEBU
2.5000 mg | INHALATION_SOLUTION | Freq: Three times a day (TID) | RESPIRATORY_TRACT | Status: DC
  Administered 2017-10-08 – 2017-10-09 (×3): 2.5 mg via RESPIRATORY_TRACT
  Filled 2017-10-08 (×3): qty 3

## 2017-10-08 MED ORDER — WARFARIN SODIUM 7.5 MG PO TABS
7.5000 mg | ORAL_TABLET | Freq: Once | ORAL | Status: AC
  Administered 2017-10-08: 7.5 mg via ORAL
  Filled 2017-10-08: qty 1

## 2017-10-08 NOTE — Progress Notes (Signed)
Pt refused multiple times to have blood drawn, became combative with RN

## 2017-10-08 NOTE — Progress Notes (Addendum)
ANTICOAGULATION CONSULT NOTE Pharmacy Consult:  Heparin/warfarin  Indication:  History of atrial thrombus  Allergies  Allergen Reactions  . Codeine Anaphylaxis and Nausea And Vomiting  . Lisinopril Itching and Rash  . Hydrocodone Other (See Comments)    On MAR  . Hydrocodone-Acetaminophen Nausea And Vomiting    Cannot take any acetaminophen due to liver issues per sister  . Tegaderm Ag Mesh [Silver] Other (See Comments)    Allergy to "silver compounds" listed on MAR  . Tylenol [Acetaminophen] Nausea Only and Other (See Comments)    Cannot take any acetaminophen due to liver issues per sister    Patient Measurements: Height: 6' (182.9 cm) Weight: 201 lb 4.5 oz (91.3 kg) IBW/kg (Calculated) : 77.6  Heparin Dosing weight: 91.8 kg  Vital Signs: BP: 86/60 (12/16 1800) Pulse Rate: 57 (12/16 2031)  Labs: Recent Labs    10/06/17 0414 10/07/17 0522 10/08/17 0344 10/08/17 1521 10/08/17 1941  HGB 9.8* 9.6* 10.0*  --   --   HCT 30.8* 30.6* 31.5*  --   --   PLT 294 285 300  --   --   LABPROT 16.6* 16.3* 16.5*  --   --   INR 1.35 1.32 1.35  --   --   HEPARINUNFRC 0.69 0.69 0.78* >2.20* 0.33  CREATININE 2.18* 2.36* 2.30*  --   --     Assessment: 61 y.o. male with h/o atrial thrombus on IV heparin gtt. PTA warfarin restarted 12/14.  -heparin level now at goal on 2600 units/hr (previous level > 2.2 was as error) -INR= 1.35  Home warfarin dose: 5 mg daily ;   (INR on admit 11/28 was 1.77)   Goal of Therapy:  INR 2-3 Heparin level 0.3-0.7 units/ml Monitor platelets by anticoagulation protocol: Yes  Plan:  -Continue Heparin gtt  2600 units/h -Warfarin 7.5 mg x 1 this evening  -Daily Heparin level, INR, and CBC while on therapy  Harland German, Pharm D 10/08/2017 9:17 PM

## 2017-10-08 NOTE — Progress Notes (Signed)
PROGRESS NOTE    Curtis Keller  DGL:[id number] DOB: 11-14-1955 DOA: 09/20/2017 PCP: [facility]   Brief Narrative:  61 yr old WM, SNF resident, with PMHx  COPD(On home oxygen therapy) , MI ,Ischemic cardiomyopathy (EF 51%),OACZYSA systolic CHF , CAD, Apical mural LV thrombus, HTN, Anemia, Stroke  (12/19/2015) with residual Left hemiplegia, Depression,  Dementia, Anxiety, Cerebral infarction Chronic bronchitis, ,Dysphagia, Falls, Hepatitis C, gout, HLD, Protein calorie malnutrition, Spleen injury Status post tracheostomy ([place]), Diabetes Type II .    Admitted 11/28 with reports of altered mental status for 2 days. Found to have MRSA PNA. Intubated x 3. Patient has also been trached 3, last trach on 12/7.        Subjective: 12/16 patient's sleep but arousable. Initially very agitated but calm down and allowed exam. Negative CP, positive SOB (resting on full vent support), negative abdominal pain, negative N/V      Assessment & Plan:   Active Problems:   Chronic systolic CHF (congestive heart failure) (HCC)   Acute respiratory failure (HCC)   Acute systolic (congestive) heart failure (HCC)   Pressure ulcer   Acute respiratory failure with hypoxia (HCC)   Acute hypoxemic respiratory failure (HCC)   MRSA bacteremia   Acute encephalopathy   Clostridium perfringens bacteremia    Tracheostomy status (HCC)   Septic Shock  - volume responsive w/ Clostridium Perfringens and MRSA bacteremia  -Completed course of antibiotics  -Sepsis physiology resolved.  MRSA pneumonia/Acute Lung Injury  -Complete course antibiotics -Chest physiotherapy vest TID -Mucomyst nebulizer TID 3 days   -DuoNeb QID -Patient continues to be vent dependent  Bilateral Lower Lobe Collapse/Consolidation -CT chest 12/16: Consistent with bilateral lower extremity mucous plugging resulting in bilateral lower lobe collapse/consolidation. High risk for postobstructive pneumonia -see  MRSA  pneumonia   Bilateral Pleural Effusion LEFT>> RIGHT -see CHF   Acute Pulmonary Edema  -See CHF   Acute respiratory failure with hypoxia/Ventilator Dependent  -Respiratory failure initially secondary to pneumonia however. Now patient has bilateral lower lobe collapse/consolidation see above  -Continue daily weaning trials.  -Titrate O2 to maintain SPO2 89-93% -12/15 tolerated trach collar for~90 minutes -Per PCCM note patient will require trach changed to XLT once track more mature (established roughly 12/14)  PICC line Malpositioning -PICC line team PICC line will have to be removed or repositioned. Currently has flipped into the RIGHT IJ -12/16 per note are in [place] line was repositioned on 12/15 @ ~6301  Chronic Systolic CHF (EF 60%)  -Repeat echocardiogram unchanged EF -Strict in and out since admission +14.7 L -Daily weight Filed Weights   10/06/17 0418 10/07/17 0423 10/08/17 0419  Weight: 208 lb 12.4 oz (94.7 kg) 209 lb 3.5 oz (94.9 kg) 201 lb 4.5 oz (91.3 kg)  -Coreg 9.375 mg BID (per cardiology) -Fluid status > 14 L positive since admission. (Secondary to tube feeds and heparin drip) -Creatinine improved with one-time dose Lasix 80 mg overnight. -12/16 Lasix 60 mg BID: Monitor renal status closely  -Transfuse for hemoglobin<8  Elevated troponin,  -Most likely demand ischemia.   Tachycardia:  -multiple EKG's show frequent PVC, has had episodes of SVT and even non-sustained VT though less frequent on BB  -Resolved  -See CHF   LEFT  ventricular thrombus,  -Heparin drip--> warfarin therapy per pharmacy 12/14 Recent Labs  Lab 10/06/17 0414 10/07/17 0522 10/08/17 0344  INR 1.35 1.32 1.35  -Still subtherapeutic   Anemia of Chronic Disease (baseline HgB~12-13)  -Occult blood pending -Anemia panel  most consistent with anemia of chronic disease  Recent Labs  Lab 10/03/17 0512 10/04/17 0429 10/05/17 0500 10/06/17 0414 10/07/17 0522 10/08/17 0344  HGB 9.5*  9.7* 10.1* 9.8* 9.6* 10.0*     Acute on CKD stage III (baseline Cr~1.87)  Recent Labs  Lab 10/03/17 0512 10/04/17 0900 10/05/17 0800 10/06/17 0414 10/07/17 0522 10/08/17 0344  CREATININE 1.80* 1.83* 1.87* 2.18* 2.36* 2.30*  -at baseline  Metabolic encephalopathy  -patient appears to be at baseline: Nods yes and no appropriately to questions. Unknown baseline.   -hold all sedating medication   Dysphasia;  -has chronic trach  Hx of Hep C -HCV quantitative negative   Diabetes type 2 uncontrolled with complications  -99/37 Hemoglobin A1c = 10.1   Sacral Decubitus Ulcer  -Treatment per WOC recommendations: Hydrotherapy daily   -Ensure been on rotation at all times   -Frequent turning  - Endotracheal protection -Foam dressing to sacrum/heels   Mechanical Falls with Prior Hip Fracture -Monitor / fall precautions  -PT efforts as able      DVT prophylaxis: Heparin drip--> warfarin per pharmacy Code Status: Full Family Communication: None Disposition Plan: LTAC [facility]?   Consultants:  [facility] M Heart failure ID   Procedures/Significant Events:  11/28  Admit 12/03  Extubated  12/04  Re-intubated (suspect medication related) 12/05 Eechocardiogram:-  negative for vegetation -LVEF 20-25% -Left atrium: mildly to moderately dilated.  12/15 PCXR-Recurrent malpositioning of the right upper extremity PICC line, flipped into the right IJ.  - Pulmonary edema appears mildly progressed since 10/03/2017. -Continued pleural effusions, LEFT >> RIGHT  -bilateral lower lobe collapse or consolidation.    I have personally reviewed and interpreted all radiology studies and my findings are as above.  VENTILATOR SETTINGS: PRVC FiO2: 40% Set rate: 16 Vt Set 620 ml  PEEP: 5 cm H2O    Cultures 11/28 Sputum culture positive MRSA  11/28 BCx2  positive MRSA, C perfringens  11/30 BCx2 negative, final   Antimicrobials: Anti-infectives (From admission, onward)   Start      Dose/Rate Stop   09/29/17 0800  vancomycin (VANCOCIN) 500 mg in sodium chloride 0.9 % 100 mL IVPB     500 mg 100 mL/hr over 60 Minutes 10/05/17 0850   09/22/17 1000  metroNIDAZOLE (FLAGYL) IVPB 500 mg     500 mg 100 mL/hr over 60 Minutes 10/05/17 1815   09/21/17 0600  vancomycin (VANCOCIN) IVPB 750 mg/150 ml premix  Status:  Discontinued     750 mg 150 mL/hr over 60 Minutes 09/29/17 0620   09/20/17 1400  piperacillin-tazobactam (ZOSYN) IVPB 3.375 g  Status:  Discontinued     3.375 g 12.5 mL/hr over 240 Minutes 09/21/17 1004   09/20/17 0800  vancomycin (VANCOCIN) IVPB 750 mg/150 ml premix     750 mg 150 mL/hr over 60 Minutes 09/20/17 2012   09/20/17 0415  ceFEPIme (MAXIPIME) 2 g in dextrose 5 % 50 mL IVPB     2 g 100 mL/hr over 30 Minutes 09/20/17 0512   09/20/17 0415  vancomycin (VANCOCIN) IVPB 1000 mg/200 mL premix     1,000 mg 200 mL/hr over 60 Minutes 09/20/17 0548       Devices    LINES / TUBES:  RUE PICC 12/4 >> ETT 12/2 > 12/3 ETT 12/4 > 12/6 ETT 12/7 > 12/7 Trach Shiley 8 cuffed 12/7 >>     Continuous Infusions: . sodium chloride 250 mL (10/07/17 2120)  . feeding supplement (VITAL AF 1.2 CAL) 1,000 mL (10/08/17  0600)  . heparin 2,600 Units/hr (10/08/17 0505)     Objective: Vitals:   10/08/17 0400 10/08/17 0419 10/08/17 0800 10/08/17 0823  BP: 121/81 121/81 95/73   Pulse: 69  61   Resp: (!) 21 17 16    Temp:  (!) 97.5 F (36.4 C)    TempSrc:  Axillary    SpO2: 100%  94% 90%  Weight:  201 lb 4.5 oz (91.3 kg)    Height:        Intake/Output Summary (Last 24 hours) at 10/08/2017 3734 Last data filed at 10/08/2017 0600 Gross per 24 hour  Intake 2584.71 ml  Output 1800 ml  Net 784.71 ml   Filed Weights   10/06/17 0418 10/07/17 0423 10/08/17 0419  Weight: 208 lb 12.4 oz (94.7 kg) 209 lb 3.5 oz (94.9 kg) 201 lb 4.5 oz (91.3 kg)    Physical Exam:  General:   sleepy but arousable, once awake  follows all commands, positive acute respiratory  distress Neck:  Negative scars, masses, torticollis, lymphadenopathy, JVD, #8 cuffed trach in place  Lungs:  diffuse coarse breath sounds, negative wheezes, positive by basilar rhonchi LEFT>> RIGHT  Cardiovascular: Regular rate and rhythm without murmur gallop or rub normal S1 and S2 Abdomen: negative abdominal pain, nondistended, positive soft, bowel sounds, no rebound, no ascites, no appreciable mass Extremities: No significant cyanosis, clubbing, or edema bilateral lower extremities Skin: Negative rashes, lesions, ulcers Psychiatric:  Negative depression, negative anxiety, negative fatigue, negative mania, agitated Central nervous system:  Cranial nerves II through XII intact, tongue/uvula midline, all extremities muscle strength 5/5, sensation intact throughout, negative expressive aphasia, negative receptive aphasia.     .     Data Reviewed: Care during the described time interval was provided by me .  I have reviewed this patient's available data, including medical history, events of note, physical examination, and all test results as part of my evaluation.   CBC: Recent Labs  Lab 10/04/17 0429 10/05/17 0500 10/06/17 0414 10/07/17 0522 10/08/17 0344  WBC 9.0 10.4 9.7 10.9* 9.6  HGB 9.7* 10.1* 9.8* 9.6* 10.0*  HCT 31.1* 31.8* 30.8* 30.6* 31.5*  MCV 97.8 96.4 96.3 95.9 96.3  PLT 321 323 294 285 287   Basic Metabolic Panel: Recent Labs  Lab 10/02/17 0426 10/03/17 0512 10/04/17 0900 10/05/17 0800 10/06/17 0414 10/07/17 0522 10/08/17 0344  NA 148* 144 144 142 143 141 143  K 3.5 3.7 4.3 3.9 4.1 3.9 4.0  CL 113* 111 112* 109 110 108 108  CO2 28 27 26 26 27 26 27   GLUCOSE 118* 119* 104* 99 115* 105* 104*  BUN 65* 60* 57* 59* 60* 65* 65*  CREATININE 1.82* 1.80* 1.83* 1.87* 2.18* 2.36* 2.30*  CALCIUM 8.7* 8.7* 8.7* 8.9 9.0 8.8* 9.1  MG 1.9 2.0 1.9  --  2.1 2.1 2.2  PHOS 3.4 3.8  --   --   --   --   --    GFR: Estimated Creatinine Clearance: 36.6 mL/min (A) (by C-G  formula based on SCr of 2.3 mg/dL (H)). Liver Function Tests: No results for input(s): AST, ALT, ALKPHOS, BILITOT, PROT, ALBUMIN in the last 168 hours. No results for input(s): LIPASE, AMYLASE in the last 168 hours. No results for input(s): AMMONIA in the last 168 hours. Coagulation Profile: Recent Labs  Lab 10/06/17 0414 10/07/17 0522 10/08/17 0344  INR 1.35 1.32 1.35   Cardiac Enzymes: No results for input(s): CKTOTAL, CKMB, CKMBINDEX, TROPONINI in the last 168 hours. BNP (last  3 results) No results for input(s): PROBNP in the last 8760 hours. HbA1C: No results for input(s): HGBA1C in the last 72 hours. CBG: Recent Labs  Lab 10/07/17 1523 10/07/17 1951 10/08/17 0012 10/08/17 0433 10/08/17 0722  GLUCAP 107* 113* 115* 106* 109*   Lipid Profile: No results for input(s): CHOL, HDL, LDLCALC, TRIG, CHOLHDL, LDLDIRECT in the last 72 hours. Thyroid Function Tests: No results for input(s): TSH, T4TOTAL, FREET4, T3FREE, THYROIDAB in the last 72 hours. Anemia Panel: Recent Labs    10/06/17 1954  VITAMINB12 641  FOLATE 45.6  FERRITIN 25  TIBC 216*  IRON 19*  RETICCTPCT 1.9   Urine analysis:    Component Value Date/Time   COLORURINE YELLOW 09/21/2017 1257   APPEARANCEUR HAZY (A) 09/21/2017 1257   LABSPEC 1.024 09/21/2017 1257   PHURINE 5.0 09/21/2017 1257   GLUCOSEU NEGATIVE 09/21/2017 1257   HGBUR LARGE (A) 09/21/2017 1257   BILIRUBINUR NEGATIVE 09/21/2017 1257   KETONESUR NEGATIVE 09/21/2017 1257   PROTEINUR 100 (A) 09/21/2017 1257   NITRITE NEGATIVE 09/21/2017 1257   LEUKOCYTESUR NEGATIVE 09/21/2017 1257   Sepsis Labs: '@LABRCNTIP'$ (procalcitonin:4,lacticidven:4)  )No results found for this or any previous visit (from the past 240 hour(s)).       Radiology Studies: Dg Chest Port 1 View  Result Date: 10/07/2017 CLINICAL DATA:  Check PICC line placement EXAM: PORTABLE CHEST 1 VIEW COMPARISON:  10/07/2017 FINDINGS: Previously placed PICC line has now been  repositioned and lies at the cavoatrial junction in satisfactory position. Remainder of the exam is stable in appearance. IMPRESSION: PICC line in satisfactory position. The remainder of the exam is stable. Electronically Signed   By: Inez Catalina M.D.   On: 10/07/2017 13:33   Dg Chest Port 1 View  Result Date: 10/07/2017 CLINICAL DATA:  62 year old male with fluid overload, CHF. EXAM: PORTABLE CHEST 1 VIEW COMPARISON:  10/03/2017 and earlier. FINDINGS: Portable AP semi upright view at 0752 hours. Stable tracheostomy tube. However, the right upper extremity approach PICC line catheter has flipped into the right IJ. Stable cardiomegaly and mediastinal contours. Continued dense retrocardiac opacity. Increased indistinct appearance of bilateral pulmonary vascularity and mildly increased ground-glass type bilateral pulmonary opacity. No pneumothorax. Small pleural effusions suspected, greater on the left. IMPRESSION: 1. Recurrent malpositioning of the right upper extremity PICC line, flipped into the right IJ. This should be repositioned or replaced to avoid right internal jugular vein complications. 2. Pulmonary edema appears mildly progressed since 10/03/2017. Continued pleural effusions, greater on the left, and bilateral lower lobe collapse or consolidation. These results will be called to the ordering clinician or representative by the Radiologist Assistant, and communication documented in the PACS or zVision Dashboard. Electronically Signed   By: Genevie Ann M.D.   On: 10/07/2017 08:21        Scheduled Meds: . atorvastatin  40 mg Per Tube QHS  . carvedilol  9.375 mg Per Tube BID WC  . chlorhexidine gluconate (MEDLINE KIT)  15 mL Mouth Rinse BID  . Chlorhexidine Gluconate Cloth  6 each Topical Daily  . collagenase   Topical Daily  . diazepam  2 mg Oral Daily  . famotidine  20 mg Per Tube Daily  . feeding supplement (PRO-STAT SUGAR FREE 64)  30 mL Per Tube Daily  . insulin aspart  0-15 Units  Subcutaneous Q4H  . mouth rinse  15 mL Mouth Rinse QID  . multivitamin  15 mL Per Tube Daily  . sodium chloride flush  10-40 mL Intracatheter Q12H  .  Warfarin - Pharmacist Dosing Inpatient   Does not apply q1800   Continuous Infusions: . sodium chloride 250 mL (10/07/17 2120)  . feeding supplement (VITAL AF 1.2 CAL) 1,000 mL (10/08/17 0600)  . heparin 2,600 Units/hr (10/08/17 0505)     LOS: 18 days    Time spent: 40 minutes    Schylar Wuebker, Geraldo Docker, MD [facility] Pager [phone]   If 7PM-7AM, please contact night-coverage [email or web address] Password [facility] 10/08/2017, 8:33 AM

## 2017-10-08 NOTE — Progress Notes (Signed)
ANTICOAGULATION CONSULT NOTE - Follow Up Consult  Pharmacy Consult for heparin Indication: h/o apical thrombus  Labs: Recent Labs    10/05/17 0800 10/06/17 0414 10/07/17 0522 10/08/17 0344  HGB  --  9.8* 9.6* 10.0*  HCT  --  30.8* 30.6* 31.5*  PLT  --  294 285 300  LABPROT  --  16.6* 16.3* 16.5*  INR  --  1.35 1.32 1.35  HEPARINUNFRC  --  0.69 0.69 0.78*  CREATININE 1.87* 2.18* 2.36*  --     Assessment: 61yo male now above goal on heparin after several levels at upper end of goal.  Goal of Therapy:  Heparin level 0.3-0.7 units/ml   Plan:  Will decrease heparin gtt by 10% to 2600 units/hr and check level in 8hr.  Vernard Gambles, PharmD, BCPS  10/08/2017,4:58 AM

## 2017-10-08 NOTE — Progress Notes (Signed)
Patient assessed per protocol.  Tolerated ventilator well.  No signs or symptoms of distress noted.  Vital AF at 60 mls/hr via g tube.  Patient denies pain.  HOB elevated to prevent aspiration.  Will continue to monitor.

## 2017-10-08 NOTE — Progress Notes (Signed)
Pt agreed after 4th attempt, to let RN obtain blood draw.  Sent to lab via tube.

## 2017-10-09 DIAGNOSIS — R0902 Hypoxemia: Secondary | ICD-10-CM | POA: Diagnosis present

## 2017-10-09 LAB — CBC
HEMATOCRIT: 33.3 % — AB (ref 39.0–52.0)
Hemoglobin: 10.6 g/dL — ABNORMAL LOW (ref 13.0–17.0)
MCH: 30.7 pg (ref 26.0–34.0)
MCHC: 31.8 g/dL (ref 30.0–36.0)
MCV: 96.5 fL (ref 78.0–100.0)
Platelets: 285 10*3/uL (ref 150–400)
RBC: 3.45 MIL/uL — ABNORMAL LOW (ref 4.22–5.81)
RDW: 15.3 % (ref 11.5–15.5)
WBC: 11.4 10*3/uL — ABNORMAL HIGH (ref 4.0–10.5)

## 2017-10-09 LAB — GLUCOSE, CAPILLARY
GLUCOSE-CAPILLARY: 94 mg/dL (ref 65–99)
GLUCOSE-CAPILLARY: 103 mg/dL — AB (ref 65–99)
GLUCOSE-CAPILLARY: 106 mg/dL — AB (ref 65–99)
GLUCOSE-CAPILLARY: 115 mg/dL — AB (ref 65–99)
GLUCOSE-CAPILLARY: 119 mg/dL — AB (ref 65–99)

## 2017-10-09 LAB — HEPARIN LEVEL (UNFRACTIONATED)
HEPARIN UNFRACTIONATED: 0.82 [IU]/mL — AB (ref 0.30–0.70)
HEPARIN UNFRACTIONATED: 1.98 [IU]/mL — AB (ref 0.30–0.70)
HEPARIN UNFRACTIONATED: 0.55 [IU]/mL (ref 0.30–0.70)

## 2017-10-09 LAB — BASIC METABOLIC PANEL
Anion gap: 9 (ref 5–15)
BUN: 68 mg/dL — AB (ref 6–20)
CALCIUM: 9.5 mg/dL (ref 8.9–10.3)
CO2: 27 mmol/L (ref 22–32)
CREATININE: 2.31 mg/dL — AB (ref 0.61–1.24)
Chloride: 107 mmol/L (ref 101–111)
GFR calc non Af Amer: 29 mL/min — ABNORMAL LOW (ref >60)
GFR, EST AFRICAN AMERICAN: 33 mL/min — AB (ref >60)
Glucose, Bld: 97 mg/dL (ref 65–99)
Potassium: 4.1 mmol/L (ref 3.5–5.1)
Sodium: 143 mmol/L (ref 135–145)

## 2017-10-09 LAB — PROTIME-INR
INR: 1.75
Prothrombin Time: 20.3 seconds — ABNORMAL HIGH (ref 11.4–15.2)

## 2017-10-09 LAB — MAGNESIUM: Magnesium: 2.3 mg/dL (ref 1.7–2.4)

## 2017-10-09 MED ORDER — WARFARIN SODIUM 5 MG PO TABS
5.0000 mg | ORAL_TABLET | Freq: Once | ORAL | Status: AC
  Administered 2017-10-09: 5 mg via ORAL
  Filled 2017-10-09: qty 1

## 2017-10-09 MED ORDER — AMIODARONE HCL 200 MG PO TABS
200.0000 mg | ORAL_TABLET | Freq: Two times a day (BID) | ORAL | Status: DC
  Administered 2017-10-09 – 2017-10-10 (×2): 200 mg
  Filled 2017-10-09 (×2): qty 1

## 2017-10-09 MED ORDER — LEVALBUTEROL HCL 0.63 MG/3ML IN NEBU
0.6300 mg | INHALATION_SOLUTION | Freq: Three times a day (TID) | RESPIRATORY_TRACT | Status: DC
  Administered 2017-10-09: 0.63 mg via RESPIRATORY_TRACT
  Filled 2017-10-09: qty 3

## 2017-10-09 MED ORDER — AMIODARONE HCL 200 MG PO TABS
200.0000 mg | ORAL_TABLET | Freq: Two times a day (BID) | ORAL | Status: DC
  Administered 2017-10-09: 200 mg via ORAL
  Filled 2017-10-09: qty 1

## 2017-10-09 MED ORDER — CARVEDILOL 12.5 MG PO TABS
12.5000 mg | ORAL_TABLET | Freq: Two times a day (BID) | ORAL | Status: DC
  Administered 2017-10-09 – 2017-10-10 (×3): 12.5 mg
  Filled 2017-10-09 (×3): qty 1

## 2017-10-09 MED ORDER — LEVALBUTEROL HCL 0.63 MG/3ML IN NEBU: 0.6300 mg | INHALATION_SOLUTION | RESPIRATORY_TRACT | Status: DC | PRN

## 2017-10-09 MED ORDER — DIAZEPAM 2 MG PO TABS
2.0000 mg | ORAL_TABLET | Freq: Every day | ORAL | Status: DC
  Administered 2017-10-10: 2 mg
  Filled 2017-10-09: qty 1

## 2017-10-09 NOTE — Progress Notes (Signed)
PULMONARY / CRITICAL CARE MEDICINE   Name: Curtis Keller MRN: [id number] DOB: April 06, 1956    ADMISSION DATE:  09/20/2017 CONSULTATION DATE:  10/20/2017  REFERRING MD:  ED  CHIEF COMPLAINT:  Shortness of breath  Brief:   61 yr old M, SNF resident, with PMH of COPD, CHF EF 20%, CAD, apical mural thrombus   Admitted 11/28 with reports of altered mental status for 2 days. Found to have MRSA PNA. Intubated x 3 and ultimately trached on 12/7.   SUBJECTIVE:   No events overnight, no new complaints  VITAL SIGNS: BP 95/71   Pulse 87   Temp 98.6 F (37 C) (Oral)   Resp 13   Ht 6' (1.829 m)   Wt 88.8 kg (195 lb 12.3 oz)   SpO2 100%   BMI 26.55 kg/m   HEMODYNAMICS: CVP:  [14 mmHg] 14 mmHg  VENTILATOR SETTINGS: Vent Mode: CPAP;PSV FiO2 (%):  [40 %] 40 % Set Rate:  [16 bmp] 16 bmp Vt Set:  [620 mL] 620 mL PEEP:  [5 cmH20] 5 cmH20 Pressure Support:  [5 cmH20] 5 cmH20 Plateau Pressure:  [9 cmH20-19 cmH20] 12 cmH20  INTAKE / OUTPUT: I/O last 3 completed shifts: In: 3649 [I.V.:1189; NG/GT:2460] Out: 2900 [Urine:2900]  PHYSICAL EXAMINATION: General: Chronically ill appearing male, NAD HEENT: [place]/AT, PERRL, EOM-I and MMM.  Trach midline Neuro: Awake and interactive, moving ext to command PULM: CTA bilaterally GI: Soft, NT, ND and +BS Extremities: -edema and -tenderness Skin: Decub noted  LABS:  BMET Recent Labs  Lab 10/07/17 0522 10/08/17 0344 10/09/17 0451  NA 141 143 143  K 3.9 4.0 4.1  CL 108 108 107  CO2 26 27 27   BUN 65* 65* 68*  CREATININE 2.36* 2.30* 2.31*  GLUCOSE 105* 104* 97   Electrolytes Recent Labs  Lab 10/03/17 0512  10/07/17 0522 10/08/17 0344 10/09/17 0451  CALCIUM 8.7*   < > 8.8* 9.1 9.5  MG 2.0   < > 2.1 2.2 2.3  PHOS 3.8  --   --   --   --    < > = values in this interval not displayed.   CBC Recent Labs  Lab 10/07/17 0522 10/08/17 0344 10/09/17 0451  WBC 10.9* 9.6 11.4*  HGB 9.6* 10.0* 10.6*  HCT 30.6* 31.5* 33.3*  PLT  285 300 285   Coag's Recent Labs  Lab 10/07/17 0522 10/08/17 0344 10/09/17 0451  INR 1.32 1.35 1.75    Sepsis Markers No results for input(s): LATICACIDVEN, PROCALCITON, O2SATVEN in the last 168 hours. ABG No results for input(s): PHART, PCO2ART, PO2ART in the last 168 hours. Liver Enzymes No results for input(s): AST, ALT, ALKPHOS, BILITOT, ALBUMIN in the last 168 hours. Cardiac Enzymes No results for input(s): TROPONINI, PROBNP in the last 168 hours. Glucose Recent Labs  Lab 10/08/17 1530 10/08/17 2054 10/08/17 2342 10/09/17 0442 10/09/17 0726 10/09/17 1122  GLUCAP 126* 114* 122* 94 103* 106*   Imaging No results found.  CULTURES Sputum culture 11/28 > MRSA BCx2 11/28 > MRSA, C perfringens BCx2 11/30 > negative Urine 11/29>>> neg   ANTIBIOTICS: Zosyn 11/28 > 11/29 Vanc 11/28 >> Flagyl 11/30 >>   EVENTS 11/28  Admit 12/03  Extubated  12/04  Re-intubated (suspect medication related) 12/05  TEE   STUDIES TEE 12/6 >> diffuse hypokinesis, LVEF 20-25%, may be a small calcified LV thrombus at the apex, no vegetation  LINES RUE PICC 12/4 >> ETT 12/2 > 12/3 ETT 12/4 > 12/6 ETT 12/7 >  12/7 Trach 12/7 Shiley 8 cuffed >>  ASSESSMENT / PLAN: 61 year old male with chronic respiratory failure that is now trached.  Tolerating PS trials this AM.  On exam, more alert and following some command with coarse BS diffusely.  I reviewed CXR myself, trach is in good position.  Discussed with PCCM-NP.  Respiratory failure:  - Proceed with PS trials as able  - Hold of TC  - PS trials as able  Hypoxemia:  - Titrate O2 for sat of 88-92%  Trach status:  - Maintain current trach size and type, no decannulation as this is the patient's 3rd trach.  - Will need a vent SNF, do not believe patient will fully liberate from the vent  PCCM will see twice per week.  Alyson Reedy, M.D. [facility] Pulmonary/Critical Care Medicine. Pager: [phone]. After hours pager:  [phone].

## 2017-10-09 NOTE — Progress Notes (Signed)
PT was id using name, birth date, and MRN

## 2017-10-09 NOTE — Progress Notes (Signed)
Pt w/ 11 beat run of vtach @955 .  Spoke w/ Advanced Heart Failure Team to relay who is following pt. Mag and K wnl. Will continue to monitor.

## 2017-10-09 NOTE — Progress Notes (Signed)
ANTICOAGULATION CONSULT NOTE Pharmacy Consult:  Heparin/warfarin  Indication:  History of atrial thrombus  Allergies  Allergen Reactions  . Codeine Anaphylaxis and Nausea And Vomiting  . Lisinopril Itching and Rash  . Hydrocodone Other (See Comments)    On MAR  . Hydrocodone-Acetaminophen Nausea And Vomiting    Cannot take any acetaminophen due to liver issues per sister  . Tegaderm Ag Mesh [Silver] Other (See Comments)    Allergy to "silver compounds" listed on MAR  . Tylenol [Acetaminophen] Nausea Only and Other (See Comments)    Cannot take any acetaminophen due to liver issues per sister   Patient Measurements: Height: 6' (182.9 cm) Weight: 195 lb 12.3 oz (88.8 kg) IBW/kg (Calculated) : 77.6  Heparin Dosing weight: 91.8 kg  Vital Signs: Temp: 98.6 F (37 C) (12/17 0049) Temp Source: Oral (12/17 0049) BP: 88/67 (12/17 0400) Pulse Rate: 57 (12/17 0400)  Labs: Recent Labs    10/07/17 0522 10/08/17 0344 10/08/17 1521 10/08/17 1941 10/09/17 0451  HGB 9.6* 10.0*  --   --  10.6*  HCT 30.6* 31.5*  --   --  33.3*  PLT 285 300  --   --  285  LABPROT 16.3* 16.5*  --   --  20.3*  INR 1.32 1.35  --   --  1.75  HEPARINUNFRC 0.69 0.78* >2.20* 0.33 0.82*  CREATININE 2.36* 2.30*  --   --  2.31*   Assessment: 61 y.o. male with h/o atrial thrombus on IV heparin gtt. PTA warfarin restarted 12/14.   Heparin level supratherapeutic at 0.82 and drawn in opposite arm from heparin infusion per RN. CBC stable and no s/s bleeding reported.   Goal of Therapy:  INR 2-3 Heparin level 0.3-0.7 units/ml Monitor platelets by anticoagulation protocol: Yes  Plan:  Decrease heparin gtt to 2400 units/hr Heparin level in 6 hrs Daily heparin level, INR, and CBC Monitor for s/s bleeding  Einar Crow, PharmD Clinical Pharmacist 10/09/17 6:14 AM

## 2017-10-09 NOTE — Progress Notes (Signed)
TEAM 1 - Stepdown/ICU TEAM  Curtis Keller  QIW:[id number] DOB: 1956-07-14 DOA: 09/20/2017 PCP: [facility]    Brief Narrative:  61 yr old M SNF resident with a Hxof COPD(on home oxygen therapy), MI, Ischemic cardiomyopathy(EF 20%), CAD,Apical muralLVthrombus, HTN,Anemia, Stroke  (12/19/2015)with residual L hemiplegia, Depression, Dementia,Anxiety, Dysphagia, Hepatitis C, gout, HLD, Protein calorie malnutrition, and DM2 who was admitted 11/28 with altered mental status for 2 days.  He was found to have MRSA PNA, was intubated, and ultimately trached.    Significant Events: 11/28 admit  12/3 extubated  12/4 re-intubated 12/5 TTE - no vegetation - EF 20-25% 12/6 extubated 12/7 re-intubated > trach  Subjective: Pt is awake.  He is somewhat interactive.  He does not appear to be uncomfortable or in respiratory distress.    Assessment & Plan:  Septic Shock - Clostridium perfringens and MRSA bacteremia  Completed course of antibiotics - sepsis physiology resolved  MRSA pneumonia / Acute Lung Injury - Acute hypoxic resp failure  Completed course antibiotics - continues to be vent dependent - ongoing care per PCCM   Bilateral Lower Lobe Collapse / Consolidation CT chest 12/16 consistent with BLL mucous plugging resulting in bilateral lower lobe collapse/consolidation   Bilateral Pleural Effusion LEFT > RIGHT  Chronic Systolic CHF (EF 41%)  Weight on a downward trend - remains net + ~15L since admit  [facility] Weights   10/07/17 0423 10/08/17 0419 10/09/17 0519  Weight: 94.9 kg (209 lb 3.5 oz) 91.3 kg (201 lb 4.5 oz) 88.8 kg (195 lb 12.3 oz)    Elevated troponin Most c/w demand ischemia  Frequent runs of NSVT amio to be initiated per CHF Team - on BB    LEFT ventricular apical thrombus Heparin drip > warfarin therapy per Pharmacy  Anemia of Chronic Disease   Hgb stable at this time   Acute on CKD stage III (baseline Cr~1.87)  crt  stable but not at his baseline - lasix being held - follow trend  Recent Labs  Lab 10/05/17 0800 10/06/17 0414 10/07/17 0522 10/08/17 0344 10/09/17 0451  CREATININE 1.87* 2.18* 2.36* 2.30* 2.31*    Hx of Hep C HCV quantitative negative  DM2  12/13 A1c 10.1 - CBG currently well controlled   Sacral DecubitusUlcer Treatment per WOC recommendations  Mechanical Falls with Prior Hip Fracture  DVT prophylaxis: heparin gtt  Code Status: FULL CODE Family Communication: no family present at time of exam  Disposition Plan: needs placement in a vent SNF   Consultants:  PCCM CHF Team  ID   Antimicrobials:  Flagyl 11/30 > 12/13 Vanc 11/27 > 12/13  Objective: Blood pressure 95/71, pulse 87, temperature 98.6 F (37 C), temperature source Oral, resp. rate 13, height 6' (1.829 m), weight 88.8 kg (195 lb 12.3 oz), SpO2 100 %.  Intake/Output Summary (Last 24 hours) at 10/09/2017 1502 Last data filed at 10/09/2017 1000 Gross per 24 hour  Intake 864 ml  Output 1000 ml  Net -136 ml   Filed Weights   10/07/17 0423 10/08/17 0419 10/09/17 0519  Weight: 94.9 kg (209 lb 3.5 oz) 91.3 kg (201 lb 4.5 oz) 88.8 kg (195 lb 12.3 oz)    Examination: General: No acute respiratory distress on vent  Lungs: poor air movement in B bases - no wheezing  Cardiovascular: Regular rate w/o gallup or rub  Abdomen: Nontender, nondistended, soft, bowel sounds positive, no rebound, no ascites, no appreciable mass Extremities: No significant cyanosis, clubbing, or edema bilateral  lower extremities  CBC: Recent Labs  Lab 10/05/17 0500 10/06/17 0414 10/07/17 0522 10/08/17 0344 10/09/17 0451  WBC 10.4 9.7 10.9* 9.6 11.4*  HGB 10.1* 9.8* 9.6* 10.0* 10.6*  HCT 31.8* 30.8* 30.6* 31.5* 33.3*  MCV 96.4 96.3 95.9 96.3 96.5  PLT 323 294 285 300 588   Basic Metabolic Panel: Recent Labs  Lab 10/03/17 0512 10/04/17 0900 10/05/17 0800 10/06/17 0414 10/07/17 0522 10/08/17 0344 10/09/17 0451  NA  144 144 142 143 141 143 143  K 3.7 4.3 3.9 4.1 3.9 4.0 4.1  CL 111 112* 109 110 108 108 107  CO2 27 26 26 27 26 27 27   GLUCOSE 119* 104* 99 115* 105* 104* 97  BUN 60* 57* 59* 60* 65* 65* 68*  CREATININE 1.80* 1.83* 1.87* 2.18* 2.36* 2.30* 2.31*  CALCIUM 8.7* 8.7* 8.9 9.0 8.8* 9.1 9.5  MG 2.0 1.9  --  2.1 2.1 2.2 2.3  PHOS 3.8  --   --   --   --   --   --    GFR: Estimated Creatinine Clearance: 36.4 mL/min (A) (by C-G formula based on SCr of 2.31 mg/dL (H)).  Liver Function Tests: No results for input(s): AST, ALT, ALKPHOS, BILITOT, PROT, ALBUMIN in the last 168 hours. No results for input(s): LIPASE, AMYLASE in the last 168 hours. No results for input(s): AMMONIA in the last 168 hours.  Coagulation Profile: Recent Labs  Lab 10/06/17 0414 10/07/17 0522 10/08/17 0344 10/09/17 0451  INR 1.35 1.32 1.35 1.75   HbA1C: Hgb A1c MFr Bld  Date/Time Value Ref Range Status  05/06/2017 06:22 AM 6.0 (H) 4.8 - 5.6 % Final    Comment:    (NOTE)         Pre-diabetes: 5.7 - 6.4         Diabetes: >6.4         Glycemic control for adults with diabetes: <7.0   03/22/2017 01:48 AM 5.3 4.8 - 5.6 % Final    Comment:    (NOTE)         Pre-diabetes: 5.7 - 6.4         Diabetes: >6.4         Glycemic control for adults with diabetes: <7.0     CBG: Recent Labs  Lab 10/08/17 2054 10/08/17 2342 10/09/17 0442 10/09/17 0726 10/09/17 1122  GLUCAP 114* 122* 94 103* 106*    Scheduled Meds: . acetylcysteine  4 mL Nebulization TID  . albuterol  2.5 mg Nebulization TID  . amiodarone  200 mg Oral BID  . atorvastatin  40 mg Per Tube QHS  . carvedilol  12.5 mg Per Tube BID WC  . chlorhexidine gluconate (MEDLINE KIT)  15 mL Mouth Rinse BID  . Chlorhexidine Gluconate Cloth  6 each Topical Daily  . collagenase   Topical Daily  . diazepam  2 mg Oral Daily  . famotidine  20 mg Per Tube Daily  . feeding supplement (PRO-STAT SUGAR FREE 64)  30 mL Per Tube Daily  . furosemide  60 mg Intravenous  BID  . insulin aspart  0-15 Units Subcutaneous Q4H  . mouth rinse  15 mL Mouth Rinse QID  . multivitamin  15 mL Per Tube Daily  . sodium chloride flush  10-40 mL Intracatheter Q12H  . warfarin  5 mg Oral ONCE-1800  . Warfarin - Pharmacist Dosing Inpatient   Does not apply q1800    LOS: 19 days   Cherene Altes, MD [facility] Office  [phone] Pager - Text Page per Shea Evans as per below:  On-Call/Text Page:      [email or web address]      password [email or web address]  If 7PM-7AM, please contact night-coverage [email or web address] Password [email or web address] 10/09/2017, 3:02 PM

## 2017-10-09 NOTE — Progress Notes (Signed)
Physical Therapy Wound Treatment Patient Details  Name: Curtis Keller MRN: [id number] Date of Birth: December 21, 1955  Today's Date: 10/09/2017 Time: 1028-1107 Time Calculation (min): 39 min  Subjective  Subjective: Pt trached but shouting profanities the best he can.   Patient and Family Stated Goals: pt agreed to the hydrotherapy  Pain Score: Pain Score: 5   Wound Assessment  Pressure Injury 09/20/17 Unstageable - Full thickness tissue loss in which the base of the ulcer is covered by slough (yellow, tan, gray, green or brown) and/or eschar (tan, brown or black) in the wound bed. previous DTI to bilat buttocks and sacrum has  (Active)  Dressing Type ABD;Moist to dry;Other (Comment) 10/09/2017 11:13 AM  Dressing Changed 10/09/2017 11:13 AM  Dressing Change Frequency Daily 10/09/2017 11:13 AM  State of Healing Early/partial granulation 10/09/2017 11:13 AM  Site / Wound Assessment Yellow;Pink 10/09/2017 11:13 AM  % Wound base Red or Granulating 25% 10/09/2017 11:13 AM  % Wound base Yellow/Fibrinous Exudate 75% 10/09/2017 11:13 AM  % Wound base Black/Eschar 0% 10/09/2017 11:13 AM  % Wound base Other/Granulation Tissue (Comment) 0% 10/09/2017 11:13 AM  Peri-wound Assessment Erythema (blanchable) 10/09/2017 11:13 AM  Wound Length (cm) 9 cm 10/05/2017 12:46 PM  Wound Width (cm) 8 cm 10/05/2017 12:46 PM  Wound Depth (cm) 0 cm 10/05/2017 12:46 PM  Wound Surface Area (cm^2) 72 cm^2 10/05/2017 12:46 PM  Wound Volume (cm^3) 0 cm^3 10/05/2017 12:46 PM  Margins Unattached edges (unapproximated) 10/09/2017 11:13 AM  Drainage Amount Minimal 10/09/2017 11:13 AM  Drainage Description Serous;Purulent 10/09/2017 11:13 AM  Treatment Cleansed;Hydrotherapy (Pulse lavage);Packing (Saline gauze);Tape changed 10/09/2017 11:13 AM     Santyl applied to wound bed prior to applying dressing.  Hydrotherapy Pulsed lavage therapy - wound location: sacral Pulsed Lavage with Suction (psi): 12 psi Pulsed Lavage  with Suction - Normal Saline Used: 1000 mL Pulsed Lavage Tip: Tip with splash shield Selective Debridement Selective Debridement - Location: sacral Selective Debridement - Tools Used: Forceps;Scalpel Selective Debridement - Tissue Removed: lightly adhered slough/yellow eschar, performed scrapping with scalpel and scoring before applying santyl.     Wound Assessment and Plan  Wound Therapy - Assess/Plan/Recommendations Wound Therapy - Clinical Statement: Pt continues to benefit from hydrotherapy and debridement to facilitate healing and decrease bioburden to promote granular tissue formation.   Wound Therapy - Functional Problem List: lack of mobility on trach at present Factors Delaying/Impairing Wound Healing: Immobility;Multiple medical problems Hydrotherapy Plan: Debridement;Dressing change;Pulsatile lavage with suction Wound Therapy - Frequency: 6X / week Wound Therapy - Current Recommendations: PT Wound Therapy - Follow Up Recommendations: Skilled nursing facility Wound Plan: see above  Wound Therapy Goals- Improve the function of patient's integumentary system by progressing the wound(s) through the phases of wound healing (inflammation - proliferation - remodeling) by: Decrease Necrotic Tissue to: 20% Decrease Necrotic Tissue - Progress: Progressing toward goal Increase Granulation Tissue to: 80% Increase Granulation Tissue - Progress: Progressing toward goal Goals/treatment plan/discharge plan were made with and agreed upon by patient/family: Yes  Goals will be updated until maximal potential achieved or discharge criteria met.  Discharge criteria: when goals achieved, discharge from hospital, MD decision/surgical intervention, no progress towards goals, refusal/missing three consecutive treatments without notification or medical reason.  GP     Djeneba Barsch Eli Hose 10/09/2017, 11:22 AM Governor Rooks, PTA pager [phone]

## 2017-10-09 NOTE — Progress Notes (Signed)
Patient ID: Curtis Keller, male   DOB: 11/06/1955, 61 y.o.   MRN: [id number]      Advanced Heart Failure Rounding Note   Subjective:    Patient was extubated 12/6. Respiratory distress on 12/7 and re-intubated.  He then had tracheostomy placed.    Yesterday IV lasix restarted. Creatinine unchanged over the weekend 2.3. Failed vent wean. Remains on 40% trach collar.   Denies SOB.    Objective:   Weight Range: 195 lb 12.3 oz (88.8 kg) Body mass index is 26.55 kg/m.   Vital Signs:   Temp:  [98.6 F (37 C)] 98.6 F (37 C) (12/17 0727) Pulse Rate:  [34-68] 57 (12/17 0400) Resp:  [7-23] 23 (12/17 0400) BP: (86-114)/(56-83) 88/67 (12/17 0400) SpO2:  [90 %-100 %] 96 % (12/17 0400) FiO2 (%):  [40 %] 40 % (12/17 0400) Weight:  [195 lb 12.3 oz (88.8 kg)] 195 lb 12.3 oz (88.8 kg) (12/17 0519) Last BM Date: 10/08/17  Weight change: Filed Weights   10/07/17 0423 10/08/17 0419 10/09/17 0519  Weight: 209 lb 3.5 oz (94.9 kg) 201 lb 4.5 oz (91.3 kg) 195 lb 12.3 oz (88.8 kg)   Intake/Output:   Intake/Output Summary (Last 24 hours) at 10/09/2017 0745 Last data filed at 10/09/2017 0050 Gross per 24 hour  Intake 1738 ml  Output 1400 ml  Net 338 ml   Physical Exam    General:  Appears chronically ill.  HEENT: normal Neck: supple. JVP 7-8.  Trach in place. Carotids 2+ bilat; no bruits. No lymphadenopathy or thryomegaly appreciated. Cor: PMI nondisplaced. Regular rate & rhythm. No rubs, gallops or murmurs. Lungs: clear Abdomen: soft, nontender, nondistended. No hepatosplenomegaly. No bruits or masses. Good bowel sounds. Extremities: no cyanosis, clubbing, rash, edema. RUE PICC double lumen.  Neuro: alert & orientedx3, cranial nerves grossly intact. moves all 4 extremities w/o difficulty. Trach.     Telemetry   NSR with frequent PVCs 60s. Having ~20-30 PVCs per hour.   Labs    CBC Recent Labs    10/08/17 0344 10/09/17 0451  WBC 9.6 11.4*  HGB 10.0* 10.6*  HCT 31.5*  33.3*  MCV 96.3 96.5  PLT 300 285   Basic Metabolic Panel Recent Labs    10/08/17 0344 10/09/17 0451  NA 143 143  K 4.0 4.1  CL 108 107  CO2 27 27  GLUCOSE 104* 97  BUN 65* 68*  CREATININE 2.30* 2.31*  CALCIUM 9.1 9.5  MG 2.2 2.3   Liver Function Tests No results for input(s): AST, ALT, ALKPHOS, BILITOT, PROT, ALBUMIN in the last 72 hours. No results for input(s): LIPASE, AMYLASE in the last 72 hours. Cardiac Enzymes No results for input(s): CKTOTAL, CKMB, CKMBINDEX, TROPONINI in the last 72 hours.  BNP: BNP (last 3 results) Recent Labs    03/17/17 1438 03/30/17 0629 09/20/17 0545  BNP 601.2* 199.5* 1,771.9*    ProBNP (last 3 results) No results for input(s): PROBNP in the last 8760 hours.   D-Dimer No results for input(s): DDIMER in the last 72 hours. Hemoglobin A1C No results for input(s): HGBA1C in the last 72 hours. Fasting Lipid Panel No results for input(s): CHOL, HDL, LDLCALC, TRIG, CHOLHDL, LDLDIRECT in the last 72 hours. Thyroid Function Tests No results for input(s): TSH, T4TOTAL, T3FREE, THYROIDAB in the last 72 hours.  Invalid input(s): FREET3  Other results:   Imaging     Medications:    Scheduled Medications: . acetylcysteine  4 mL Nebulization TID  . albuterol    2.5 mg Nebulization TID  . atorvastatin  40 mg Per Tube QHS  . carvedilol  9.375 mg Per Tube BID WC  . chlorhexidine gluconate (MEDLINE KIT)  15 mL Mouth Rinse BID  . Chlorhexidine Gluconate Cloth  6 each Topical Daily  . collagenase   Topical Daily  . diazepam  2 mg Oral Daily  . famotidine  20 mg Per Tube Daily  . feeding supplement (PRO-STAT SUGAR FREE 64)  30 mL Per Tube Daily  . furosemide  60 mg Intravenous BID  . insulin aspart  0-15 Units Subcutaneous Q4H  . mouth rinse  15 mL Mouth Rinse QID  . multivitamin  15 mL Per Tube Daily  . sodium chloride flush  10-40 mL Intracatheter Q12H  . Warfarin - Pharmacist Dosing Inpatient   Does not apply q1800     Infusions: . sodium chloride 250 mL (10/07/17 2120)  . feeding supplement (VITAL AF 1.2 CAL) 1,000 mL (10/08/17 1634)  . heparin 2,400 Units/hr (10/09/17 0636)    PRN Medications: sodium chloride, bisacodyl, fentaNYL (SUBLIMAZE) injection, ipratropium-albuterol, metoprolol tartrate, midazolam, polyethylene glycol, sodium chloride flush  Patient Profile   Curtis Keller is a 61 y.o. male with history of COPD, Systolic CHF EF 20%, CAD, hx of CVA with left sided weakness, h/o of prolonged vent wean s/p trach, peg s/p decannulation, h/o Hepatitis C, as well as known apical mural thrombus admitted with worsening SOB and AMS  Found to have MRSA PNA and C. Perfringens + MRSA bacteremia.   Assessment/Plan   1. Septic shock: Hypotensive initially, MRSA PNA with MRSA and C perfringens in blood.  He is now off pressors.  - Stable today.  2. Acute hypoxemic respiratory failure: MRSA PNA.  He now has a tracheostomy.   - Failed wean 10/09/2017 - Trying to wean.   3. Acute on chronic systolic CHF: History of ischemic cardiomyopathy.  TEE this admission with EF 20-25%. - Set up CVP.  - Continue IV lasix for now.  - No spiro for now with AKI.  - Increase coreg 12.5 mg twice a day.     - No ARB/ACEI/ARNI with elevated creatinine.  3. PVCs/short episodes of NSVT. 12/11 30 beats NSVT   - Not ICD candidate given baseline debilitation (left hemiparesis, PEG, living in SNF).  - Having 20-30 PVCs per hour.  - Increase coreg as above.  - May need amio.   4. ID: No endocarditis on TEE.  Had MRSA PNA with MRSA and C perfringens in blood.  - Per primary.  - Afebrile.  5. CAD: Has history of BMS to RCA in 2016.  Troponin to 2.6 this admission, could be demand ischemia from septic shock.   - Continue statin.  - No ASA with full anticoagulation.  - Consider eventual cath but would need to see improved renal function.  6. AKI on CKD III - Creatinine 2.3 over the weekend.  .BMET in am.  7. LV  thrombus: Chronic warfarin, TEE appeared to show a small calcified LV thrombus.   - Started back on coumadin 10/05/17. INR 1.75. Pharmacy dosing coumadin.   Length of Stay: 19  Amy Clegg, NP  10/09/2017, 7:45 AM  Advanced Heart Failure Team Pager [phone] (M-F; 7a - 4p)  Please contact [facility] for night-coverage after hours (4p -7a ) and weekends on [email or web address]  Patient seen with NP, agree with the above note.  He is stable this morning, on pressure support trial.  Has had ongoing runs of   NSVT.  He is getting IV Lasix.   On exam, he does not look volume overloaded.  Creatinine 2.3 this morning.   I suspect that he does not need to be on IV Lasix at this point especially with higher BUN/creatinine.  Will check CVP this morning to confirm volume status.   Frequent PVCs and ongoing runs of NSVT.  Increase Coreg as above. At this point, think we should start amiodarone.  Will start at 200 mg bid.   He is on heparin gtt bridging to therapeutic warfarin.   Loralie Champagne 10/09/2017 10:26 AM

## 2017-10-09 NOTE — Progress Notes (Signed)
ANTICOAGULATION CONSULT NOTE Pharmacy Consult:  Heparin/warfarin  Indication:  History of atrial thrombus  Patient Measurements: Height: 6' (182.9 cm) Weight: 195 lb 12.3 oz (88.8 kg) IBW/kg (Calculated) : 77.6  Heparin Dosing weight: 91.8 kg  Vital Signs: Temp: 98.6 F (37 C) (12/17 0727) Temp Source: Oral (12/17 0727) BP: 79/55 (12/17 0801) Pulse Rate: 70 (12/17 0801)  Labs: Recent Labs    10/07/17 0522 10/08/17 0344 10/08/17 1521 10/08/17 1941 10/09/17 0451  HGB 9.6* 10.0*  --   --  10.6*  HCT 30.6* 31.5*  --   --  33.3*  PLT 285 300  --   --  285  LABPROT 16.3* 16.5*  --   --  20.3*  INR 1.32 1.35  --   --  1.75  HEPARINUNFRC 0.69 0.78* >2.20* 0.33 0.82*  CREATININE 2.36* 2.30*  --   --  2.31*   Assessment: 61 y.o. male with h/o atrial thrombus on IV heparin gtt. PTA warfarin restarted 12/14. The patient's home dose was noted to be 5 mg daily.  INR today remains SUBtherapeutic however is trending up nicely (INR 1.75 << 1.35, goal of 2-3). The patient's initial Heparin level this afternoon was SUPRAtherapeutic however was falsely drawn from the patient's PICC - a re-drawn from a peripheral stick showed a therapeutic level. (HL 0.55 after a rate decrease earlier today). Hgb/Hct increasing - no overt s/sx of bleeding noted.   Goal of Therapy:  INR 2-3 Heparin level 0.3-0.7 units/ml Monitor platelets by anticoagulation protocol: Yes  Plan:  - Continue Heparin at 2400 units/hr (24 ml/hr) - Warfarin 5 mg x 1 dose at 1800 today - Will continue to monitor for any signs/symptoms of bleeding and will follow up with heparin level in 6 hours and PT/INR in the AM  Thank you for allowing pharmacy to be a part of this patient's care.  Georgina Pillion, PharmD, BCPS Clinical Pharmacist Pager: [phone] Clinical phone for 10/09/2017 from 7a-3:30p: [phone] If after 3:30p, please call main pharmacy at: [phone] 10/09/2017 9:58 AM

## 2017-10-10 DIAGNOSIS — I469 Cardiac arrest, cause unspecified: Secondary | ICD-10-CM | POA: Diagnosis not present

## 2017-10-10 DIAGNOSIS — D62 Acute posthemorrhagic anemia: Secondary | ICD-10-CM | POA: Diagnosis not present

## 2017-10-10 DIAGNOSIS — I5022 Chronic systolic (congestive) heart failure: Secondary | ICD-10-CM | POA: Diagnosis not present

## 2017-10-10 DIAGNOSIS — E119 Type 2 diabetes mellitus without complications: Secondary | ICD-10-CM | POA: Diagnosis not present

## 2017-10-10 DIAGNOSIS — E872 Acidosis: Secondary | ICD-10-CM | POA: Diagnosis not present

## 2017-10-10 DIAGNOSIS — M869 Osteomyelitis, unspecified: Secondary | ICD-10-CM | POA: Diagnosis not present

## 2017-10-10 DIAGNOSIS — J9690 Respiratory failure, unspecified, unspecified whether with hypoxia or hypercapnia: Secondary | ICD-10-CM | POA: Diagnosis not present

## 2017-10-10 DIAGNOSIS — J155 Pneumonia due to Escherichia coli: Secondary | ICD-10-CM | POA: Diagnosis present

## 2017-10-10 DIAGNOSIS — I5021 Acute systolic (congestive) heart failure: Secondary | ICD-10-CM | POA: Diagnosis not present

## 2017-10-10 DIAGNOSIS — G9341 Metabolic encephalopathy: Secondary | ICD-10-CM | POA: Diagnosis not present

## 2017-10-10 DIAGNOSIS — R7881 Bacteremia: Secondary | ICD-10-CM | POA: Diagnosis not present

## 2017-10-10 DIAGNOSIS — Z96642 Presence of left artificial hip joint: Secondary | ICD-10-CM | POA: Diagnosis not present

## 2017-10-10 DIAGNOSIS — I493 Ventricular premature depolarization: Secondary | ICD-10-CM | POA: Diagnosis not present

## 2017-10-10 DIAGNOSIS — N189 Chronic kidney disease, unspecified: Secondary | ICD-10-CM | POA: Diagnosis not present

## 2017-10-10 DIAGNOSIS — E875 Hyperkalemia: Secondary | ICD-10-CM | POA: Diagnosis not present

## 2017-10-10 DIAGNOSIS — J181 Lobar pneumonia, unspecified organism: Secondary | ICD-10-CM | POA: Diagnosis not present

## 2017-10-10 DIAGNOSIS — E873 Alkalosis: Secondary | ICD-10-CM | POA: Diagnosis present

## 2017-10-10 DIAGNOSIS — L89613 Pressure ulcer of right heel, stage 3: Secondary | ICD-10-CM | POA: Diagnosis present

## 2017-10-10 DIAGNOSIS — J9382 Other air leak: Secondary | ICD-10-CM | POA: Diagnosis not present

## 2017-10-10 DIAGNOSIS — Z930 Tracheostomy status: Secondary | ICD-10-CM | POA: Diagnosis not present

## 2017-10-10 DIAGNOSIS — I2721 Secondary pulmonary arterial hypertension: Secondary | ICD-10-CM | POA: Diagnosis not present

## 2017-10-10 DIAGNOSIS — Z431 Encounter for attention to gastrostomy: Secondary | ICD-10-CM | POA: Diagnosis not present

## 2017-10-10 DIAGNOSIS — L89150 Pressure ulcer of sacral region, unstageable: Secondary | ICD-10-CM | POA: Diagnosis present

## 2017-10-10 DIAGNOSIS — A419 Sepsis, unspecified organism: Secondary | ICD-10-CM | POA: Diagnosis not present

## 2017-10-10 DIAGNOSIS — I2510 Atherosclerotic heart disease of native coronary artery without angina pectoris: Secondary | ICD-10-CM | POA: Diagnosis not present

## 2017-10-10 DIAGNOSIS — K6812 Psoas muscle abscess: Secondary | ICD-10-CM | POA: Diagnosis not present

## 2017-10-10 DIAGNOSIS — I472 Ventricular tachycardia: Secondary | ICD-10-CM | POA: Diagnosis not present

## 2017-10-10 DIAGNOSIS — Y95 Nosocomial condition: Secondary | ICD-10-CM | POA: Diagnosis not present

## 2017-10-10 DIAGNOSIS — B967 Clostridium perfringens [C. perfringens] as the cause of diseases classified elsewhere: Secondary | ICD-10-CM | POA: Diagnosis not present

## 2017-10-10 DIAGNOSIS — R1312 Dysphagia, oropharyngeal phase: Secondary | ICD-10-CM | POA: Diagnosis not present

## 2017-10-10 DIAGNOSIS — F05 Delirium due to known physiological condition: Secondary | ICD-10-CM | POA: Diagnosis not present

## 2017-10-10 DIAGNOSIS — N184 Chronic kidney disease, stage 4 (severe): Secondary | ICD-10-CM | POA: Diagnosis not present

## 2017-10-10 DIAGNOSIS — E1165 Type 2 diabetes mellitus with hyperglycemia: Secondary | ICD-10-CM | POA: Diagnosis not present

## 2017-10-10 DIAGNOSIS — R0902 Hypoxemia: Secondary | ICD-10-CM | POA: Diagnosis not present

## 2017-10-10 DIAGNOSIS — R1011 Right upper quadrant pain: Secondary | ICD-10-CM | POA: Diagnosis not present

## 2017-10-10 DIAGNOSIS — J440 Chronic obstructive pulmonary disease with acute lower respiratory infection: Secondary | ICD-10-CM | POA: Diagnosis present

## 2017-10-10 DIAGNOSIS — J441 Chronic obstructive pulmonary disease with (acute) exacerbation: Secondary | ICD-10-CM | POA: Diagnosis not present

## 2017-10-10 DIAGNOSIS — R1906 Epigastric swelling, mass or lump: Secondary | ICD-10-CM | POA: Diagnosis not present

## 2017-10-10 DIAGNOSIS — R6521 Severe sepsis with septic shock: Secondary | ICD-10-CM | POA: Diagnosis not present

## 2017-10-10 DIAGNOSIS — I130 Hypertensive heart and chronic kidney disease with heart failure and stage 1 through stage 4 chronic kidney disease, or unspecified chronic kidney disease: Secondary | ICD-10-CM | POA: Diagnosis present

## 2017-10-10 DIAGNOSIS — J90 Pleural effusion, not elsewhere classified: Secondary | ICD-10-CM | POA: Diagnosis not present

## 2017-10-10 DIAGNOSIS — N390 Urinary tract infection, site not specified: Secondary | ICD-10-CM | POA: Diagnosis not present

## 2017-10-10 DIAGNOSIS — L89623 Pressure ulcer of left heel, stage 3: Secondary | ICD-10-CM | POA: Diagnosis present

## 2017-10-10 DIAGNOSIS — I69354 Hemiplegia and hemiparesis following cerebral infarction affecting left non-dominant side: Secondary | ICD-10-CM | POA: Diagnosis not present

## 2017-10-10 DIAGNOSIS — I248 Other forms of acute ischemic heart disease: Secondary | ICD-10-CM | POA: Diagnosis not present

## 2017-10-10 DIAGNOSIS — J9811 Atelectasis: Secondary | ICD-10-CM | POA: Diagnosis not present

## 2017-10-10 DIAGNOSIS — R001 Bradycardia, unspecified: Secondary | ICD-10-CM | POA: Diagnosis not present

## 2017-10-10 DIAGNOSIS — E43 Unspecified severe protein-calorie malnutrition: Secondary | ICD-10-CM | POA: Diagnosis not present

## 2017-10-10 DIAGNOSIS — B1920 Unspecified viral hepatitis C without hepatic coma: Secondary | ICD-10-CM | POA: Diagnosis not present

## 2017-10-10 DIAGNOSIS — Z452 Encounter for adjustment and management of vascular access device: Secondary | ICD-10-CM | POA: Diagnosis not present

## 2017-10-10 DIAGNOSIS — E1122 Type 2 diabetes mellitus with diabetic chronic kidney disease: Secondary | ICD-10-CM | POA: Diagnosis not present

## 2017-10-10 DIAGNOSIS — J811 Chronic pulmonary edema: Secondary | ICD-10-CM | POA: Diagnosis not present

## 2017-10-10 DIAGNOSIS — G9340 Encephalopathy, unspecified: Secondary | ICD-10-CM | POA: Diagnosis not present

## 2017-10-10 DIAGNOSIS — I5023 Acute on chronic systolic (congestive) heart failure: Secondary | ICD-10-CM | POA: Diagnosis not present

## 2017-10-10 DIAGNOSIS — R6520 Severe sepsis without septic shock: Secondary | ICD-10-CM | POA: Diagnosis present

## 2017-10-10 DIAGNOSIS — J156 Pneumonia due to other aerobic Gram-negative bacteria: Secondary | ICD-10-CM | POA: Diagnosis present

## 2017-10-10 DIAGNOSIS — E785 Hyperlipidemia, unspecified: Secondary | ICD-10-CM | POA: Diagnosis not present

## 2017-10-10 DIAGNOSIS — R4702 Dysphasia: Secondary | ICD-10-CM | POA: Diagnosis not present

## 2017-10-10 DIAGNOSIS — J189 Pneumonia, unspecified organism: Secondary | ICD-10-CM | POA: Diagnosis not present

## 2017-10-10 DIAGNOSIS — K5900 Constipation, unspecified: Secondary | ICD-10-CM | POA: Diagnosis not present

## 2017-10-10 DIAGNOSIS — I255 Ischemic cardiomyopathy: Secondary | ICD-10-CM | POA: Diagnosis not present

## 2017-10-10 DIAGNOSIS — M6281 Muscle weakness (generalized): Secondary | ICD-10-CM | POA: Diagnosis not present

## 2017-10-10 DIAGNOSIS — I10 Essential (primary) hypertension: Secondary | ICD-10-CM | POA: Diagnosis not present

## 2017-10-10 DIAGNOSIS — R4189 Other symptoms and signs involving cognitive functions and awareness: Secondary | ICD-10-CM | POA: Diagnosis not present

## 2017-10-10 DIAGNOSIS — I6789 Other cerebrovascular disease: Secondary | ICD-10-CM | POA: Diagnosis not present

## 2017-10-10 DIAGNOSIS — J9620 Acute and chronic respiratory failure, unspecified whether with hypoxia or hypercapnia: Secondary | ICD-10-CM | POA: Diagnosis not present

## 2017-10-10 DIAGNOSIS — Z430 Encounter for attention to tracheostomy: Secondary | ICD-10-CM | POA: Diagnosis not present

## 2017-10-10 DIAGNOSIS — I513 Intracardiac thrombosis, not elsewhere classified: Secondary | ICD-10-CM | POA: Diagnosis not present

## 2017-10-10 DIAGNOSIS — F0390 Unspecified dementia without behavioral disturbance: Secondary | ICD-10-CM | POA: Diagnosis not present

## 2017-10-10 DIAGNOSIS — N179 Acute kidney failure, unspecified: Secondary | ICD-10-CM | POA: Diagnosis not present

## 2017-10-10 DIAGNOSIS — J810 Acute pulmonary edema: Secondary | ICD-10-CM | POA: Diagnosis not present

## 2017-10-10 DIAGNOSIS — R509 Fever, unspecified: Secondary | ICD-10-CM | POA: Diagnosis not present

## 2017-10-10 DIAGNOSIS — L03312 Cellulitis of back [any part except buttock]: Secondary | ICD-10-CM | POA: Diagnosis not present

## 2017-10-10 DIAGNOSIS — J9621 Acute and chronic respiratory failure with hypoxia: Secondary | ICD-10-CM | POA: Diagnosis not present

## 2017-10-10 DIAGNOSIS — I509 Heart failure, unspecified: Secondary | ICD-10-CM | POA: Diagnosis not present

## 2017-10-10 DIAGNOSIS — A411 Sepsis due to other specified staphylococcus: Secondary | ICD-10-CM | POA: Diagnosis present

## 2017-10-10 DIAGNOSIS — R7981 Abnormal blood-gas level: Secondary | ICD-10-CM | POA: Diagnosis not present

## 2017-10-10 DIAGNOSIS — R1310 Dysphagia, unspecified: Secondary | ICD-10-CM | POA: Diagnosis not present

## 2017-10-10 DIAGNOSIS — J918 Pleural effusion in other conditions classified elsewhere: Secondary | ICD-10-CM | POA: Diagnosis not present

## 2017-10-10 DIAGNOSIS — R2689 Other abnormalities of gait and mobility: Secondary | ICD-10-CM | POA: Diagnosis not present

## 2017-10-10 DIAGNOSIS — J15212 Pneumonia due to Methicillin resistant Staphylococcus aureus: Secondary | ICD-10-CM | POA: Diagnosis not present

## 2017-10-10 DIAGNOSIS — Z9911 Dependence on respirator [ventilator] status: Secondary | ICD-10-CM | POA: Diagnosis not present

## 2017-10-10 DIAGNOSIS — J9601 Acute respiratory failure with hypoxia: Secondary | ICD-10-CM | POA: Diagnosis not present

## 2017-10-10 DIAGNOSIS — F419 Anxiety disorder, unspecified: Secondary | ICD-10-CM | POA: Diagnosis not present

## 2017-10-10 DIAGNOSIS — I517 Cardiomegaly: Secondary | ICD-10-CM | POA: Diagnosis not present

## 2017-10-10 DIAGNOSIS — I69398 Other sequelae of cerebral infarction: Secondary | ICD-10-CM | POA: Diagnosis not present

## 2017-10-10 DIAGNOSIS — R410 Disorientation, unspecified: Secondary | ICD-10-CM | POA: Diagnosis not present

## 2017-10-10 DIAGNOSIS — J387 Other diseases of larynx: Secondary | ICD-10-CM | POA: Diagnosis not present

## 2017-10-10 DIAGNOSIS — R918 Other nonspecific abnormal finding of lung field: Secondary | ICD-10-CM | POA: Diagnosis not present

## 2017-10-10 DIAGNOSIS — J449 Chronic obstructive pulmonary disease, unspecified: Secondary | ICD-10-CM | POA: Diagnosis not present

## 2017-10-10 DIAGNOSIS — Z09 Encounter for follow-up examination after completed treatment for conditions other than malignant neoplasm: Secondary | ICD-10-CM | POA: Diagnosis not present

## 2017-10-10 DIAGNOSIS — L8990 Pressure ulcer of unspecified site, unspecified stage: Secondary | ICD-10-CM | POA: Diagnosis not present

## 2017-10-10 DIAGNOSIS — R0989 Other specified symptoms and signs involving the circulatory and respiratory systems: Secondary | ICD-10-CM | POA: Diagnosis not present

## 2017-10-10 DIAGNOSIS — J80 Acute respiratory distress syndrome: Secondary | ICD-10-CM | POA: Diagnosis not present

## 2017-10-10 LAB — BASIC METABOLIC PANEL
ANION GAP: 9 (ref 5–15)
BUN: 68 mg/dL — ABNORMAL HIGH (ref 6–20)
CHLORIDE: 105 mmol/L (ref 101–111)
CO2: 27 mmol/L (ref 22–32)
Calcium: 9.4 mg/dL (ref 8.9–10.3)
Creatinine, Ser: 2.34 mg/dL — ABNORMAL HIGH (ref 0.61–1.24)
GFR, EST AFRICAN AMERICAN: 33 mL/min — AB (ref >60)
GFR, EST NON AFRICAN AMERICAN: 28 mL/min — AB (ref >60)
Glucose, Bld: 100 mg/dL — ABNORMAL HIGH (ref 65–99)
POTASSIUM: 4.1 mmol/L (ref 3.5–5.1)
SODIUM: 141 mmol/L (ref 135–145)

## 2017-10-10 LAB — GLUCOSE, CAPILLARY
GLUCOSE-CAPILLARY: 94 mg/dL (ref 65–99)
GLUCOSE-CAPILLARY: 105 mg/dL — AB (ref 65–99)
Glucose-Capillary: 100 mg/dL — ABNORMAL HIGH (ref 65–99)
Glucose-Capillary: 87 mg/dL (ref 65–99)
Glucose-Capillary: 91 mg/dL (ref 65–99)

## 2017-10-10 LAB — CBC
HCT: 29.2 % — ABNORMAL LOW (ref 39.0–52.0)
HEMOGLOBIN: 9.2 g/dL — AB (ref 13.0–17.0)
MCH: 30.1 pg (ref 26.0–34.0)
MCHC: 31.5 g/dL (ref 30.0–36.0)
MCV: 95.4 fL (ref 78.0–100.0)
PLATELETS: 262 10*3/uL (ref 150–400)
RBC: 3.06 MIL/uL — AB (ref 4.22–5.81)
RDW: 14.9 % (ref 11.5–15.5)
WBC: 10.2 10*3/uL (ref 4.0–10.5)

## 2017-10-10 LAB — HEPATIC FUNCTION PANEL
ALBUMIN: 2.1 g/dL — AB (ref 3.5–5.0)
ALK PHOS: 47 U/L (ref 38–126)
ALT: 10 U/L — AB (ref 17–63)
AST: 17 U/L (ref 15–41)
Bilirubin, Direct: <0.1 mg/dL — ABNORMAL LOW (ref 0.1–0.5)
TOTAL PROTEIN: 6.6 g/dL (ref 6.5–8.1)
Total Bilirubin: 0.6 mg/dL (ref 0.3–1.2)

## 2017-10-10 LAB — HEPARIN LEVEL (UNFRACTIONATED): Heparin Unfractionated: 0.38 IU/mL (ref 0.30–0.70)

## 2017-10-10 LAB — PROTIME-INR
INR: 2.09
PROTHROMBIN TIME: 23.3 s — AB (ref 11.4–15.2)

## 2017-10-10 MED ORDER — FAMOTIDINE 40 MG/5ML PO SUSR: 20.0000 mg | Freq: Every day | ORAL | 0 refills | Status: AC

## 2017-10-10 MED ORDER — ATORVASTATIN CALCIUM 40 MG PO TABS: 40.0000 mg | ORAL_TABLET | Freq: Every day | ORAL | Status: AC

## 2017-10-10 MED ORDER — WARFARIN SODIUM 4 MG PO TABS
4.0000 mg | ORAL_TABLET | Freq: Once | ORAL | Status: AC
  Administered 2017-10-10: 4 mg via ORAL
  Filled 2017-10-10: qty 1

## 2017-10-10 MED ORDER — LEVALBUTEROL HCL 0.63 MG/3ML IN NEBU
0.6300 mg | INHALATION_SOLUTION | Freq: Three times a day (TID) | RESPIRATORY_TRACT | Status: DC
  Administered 2017-10-10 (×2): 0.63 mg via RESPIRATORY_TRACT
  Filled 2017-10-10 (×2): qty 3

## 2017-10-10 MED ORDER — AMIODARONE HCL 200 MG PO TABS: 200.0000 mg | ORAL_TABLET | Freq: Two times a day (BID) | ORAL | Status: AC

## 2017-10-10 MED ORDER — LEVALBUTEROL HCL 0.63 MG/3ML IN NEBU: 0.6300 mg | INHALATION_SOLUTION | Freq: Three times a day (TID) | RESPIRATORY_TRACT | 12 refills | Status: AC

## 2017-10-10 MED ORDER — CARVEDILOL 12.5 MG PO TABS: 12.5000 mg | ORAL_TABLET | Freq: Two times a day (BID) | ORAL | Status: AC

## 2017-10-10 MED ORDER — ADULT MULTIVITAMIN LIQUID CH: 15.0000 mL | Freq: Every day | ORAL | Status: AC

## 2017-10-10 MED ORDER — INSULIN ASPART 100 UNIT/ML ~~LOC~~ SOLN: 0.0000 [IU] | SUBCUTANEOUS | 11 refills | Status: AC

## 2017-10-10 MED ORDER — DIAZEPAM 2 MG PO TABS: 2.0000 mg | ORAL_TABLET | Freq: Every day | ORAL | 0 refills | Status: AC

## 2017-10-10 MED ORDER — FUROSEMIDE 80 MG PO TABS
80.0000 mg | ORAL_TABLET | Freq: Every day | ORAL | Status: DC
  Administered 2017-10-10: 80 mg via ORAL
  Filled 2017-10-10: qty 1

## 2017-10-10 MED ORDER — FENTANYL CITRATE (PF) 100 MCG/2ML IJ SOLN: 100.0000 ug | INTRAMUSCULAR | 0 refills | Status: AC | PRN

## 2017-10-10 MED ORDER — PRO-STAT SUGAR FREE PO LIQD: 30.0000 mL | Freq: Every day | ORAL | 0 refills | Status: AC

## 2017-10-10 MED ORDER — VITAL AF 1.2 CAL PO LIQD: 1000.0000 mL | ORAL | Status: AC

## 2017-10-10 MED ORDER — LEVALBUTEROL HCL 0.63 MG/3ML IN NEBU: 0.6300 mg | INHALATION_SOLUTION | RESPIRATORY_TRACT | 12 refills | Status: AC | PRN

## 2017-10-10 MED ORDER — MIDAZOLAM HCL 2 MG/2ML IJ SOLN
1.0000 mg | INTRAMUSCULAR | 0 refills | Status: AC | PRN
Start: 2017-10-10 — End: ?

## 2017-10-10 MED ORDER — BISACODYL 10 MG RE SUPP: 10.0000 mg | Freq: Every day | RECTAL | 0 refills | Status: AC | PRN

## 2017-10-10 MED ORDER — POLYETHYLENE GLYCOL 3350 17 G PO PACK: 17.0000 g | PACK | Freq: Every day | ORAL | 0 refills | Status: AC | PRN

## 2017-10-10 MED ORDER — FUROSEMIDE 80 MG PO TABS: 80.0000 mg | ORAL_TABLET | Freq: Every day | ORAL | Status: AC

## 2017-10-10 MED ORDER — COLLAGENASE 250 UNIT/GM EX OINT: TOPICAL_OINTMENT | Freq: Every day | CUTANEOUS | 0 refills | Status: AC

## 2017-10-10 NOTE — Progress Notes (Signed)
NCM discussed patient in LOS, per medical director try LTACH,  NCM spoke with sister , Massie Bougie, she states she would like [facility] if he can be  Approved, because he could also go to the SNF there after LTACH.  She will be here at 1 pm today to speak with liason with [facility].

## 2017-10-10 NOTE — Progress Notes (Addendum)
Patient ID: Curtis Keller, male   DOB: 31-May-1956, 61 y.o.   MRN: [id number]      Advanced Heart Failure Rounding Note   Subjective:    Patient was extubated 12/6. Respiratory distress on 12/7 and re-intubated.  He then had tracheostomy placed.    Yesterday diuresed with IV lasix. Started on amio due to PVC burden.   Denies pain.     Objective:   Weight Range: 199 lb 4.7 oz (90.4 kg) Body mass index is 27.03 kg/m.   Vital Signs:   Temp:  [98 F (36.7 C)-99 F (37.2 C)] 99 F (37.2 C) (12/18 0742) Pulse Rate:  [34-87] 63 (12/18 0742) Resp:  [13-25] 16 (12/18 0742) BP: (79-113)/(55-85) 93/66 (12/18 0742) SpO2:  [90 %-100 %] 96 % (12/18 0742) FiO2 (%):  [40 %] 40 % (12/18 0400) Weight:  [199 lb 4.7 oz (90.4 kg)] 199 lb 4.7 oz (90.4 kg) (12/18 0638) Last BM Date: 10/08/17  Weight change: Filed Weights   10/08/17 0419 10/09/17 0519 10/10/17 7654  Weight: 201 lb 4.5 oz (91.3 kg) 195 lb 12.3 oz (88.8 kg) 199 lb 4.7 oz (90.4 kg)   Intake/Output:   Intake/Output Summary (Last 24 hours) at 10/10/2017 0752 Last data filed at 10/10/2017 0658 Gross per 24 hour  Intake 2269.2 ml  Output 2475 ml  Net -205.8 ml   Physical Exam   CVP 6 General:  Appears chronically ill. No resp difficulty HEENT: normal except trach Neck: supple. JVP 6-7. Carotids 2+ bilat; no bruits. No lymphadenopathy or thryomegaly appreciated. Cor: PMI nondisplaced. Regular rate & rhythm. No rubs, gallops or murmurs. Lungs: Rhonchi throughout.  Abdomen: soft, nontender, nondistended. No hepatosplenomegaly. No bruits or masses. Good bowel sounds. Extremities: no cyanosis, clubbing, rash, R and LLE heel boots. RUE PICC Neuro: trach alert   cranial nerves grossly intact. moves all 4 extremities w/o difficulty. t    Telemetry   NSR 60s with PVCs ~20 per hour.    Labs    CBC Recent Labs    10/09/17 0451 10/10/17 0445  WBC 11.4* 10.2  HGB 10.6* 9.2*  HCT 33.3* 29.2*  MCV 96.5 95.4  PLT 285  650   Basic Metabolic Panel Recent Labs    10/08/17 0344 10/09/17 0451 10/10/17 0445  NA 143 143 141  K 4.0 4.1 4.1  CL 108 107 105  CO2 27 27 27   GLUCOSE 104* 97 100*  BUN 65* 68* 68*  CREATININE 2.30* 2.31* 2.34*  CALCIUM 9.1 9.5 9.4  MG 2.2 2.3  --    Liver Function Tests Recent Labs    10/10/17 0445  AST 17  ALT 10*  ALKPHOS 47  BILITOT 0.6  PROT 6.6  ALBUMIN 2.1*   No results for input(s): LIPASE, AMYLASE in the last 72 hours. Cardiac Enzymes No results for input(s): CKTOTAL, CKMB, CKMBINDEX, TROPONINI in the last 72 hours.  BNP: BNP (last 3 results) Recent Labs    03/17/17 1438 03/30/17 0629 09/20/17 0545  BNP 601.2* 199.5* 1,771.9*    ProBNP (last 3 results) No results for input(s): PROBNP in the last 8760 hours.   D-Dimer No results for input(s): DDIMER in the last 72 hours. Hemoglobin A1C No results for input(s): HGBA1C in the last 72 hours. Fasting Lipid Panel No results for input(s): CHOL, HDL, LDLCALC, TRIG, CHOLHDL, LDLDIRECT in the last 72 hours. Thyroid Function Tests No results for input(s): TSH, T4TOTAL, T3FREE, THYROIDAB in the last 72 hours.  Invalid input(s): FREET3  Other  results:   Imaging     Medications:    Scheduled Medications: . acetylcysteine  4 mL Nebulization TID  . amiodarone  200 mg Per Tube BID  . atorvastatin  40 mg Per Tube QHS  . carvedilol  12.5 mg Per Tube BID WC  . chlorhexidine gluconate (MEDLINE KIT)  15 mL Mouth Rinse BID  . Chlorhexidine Gluconate Cloth  6 each Topical Daily  . collagenase   Topical Daily  . diazepam  2 mg Per Tube Daily  . famotidine  20 mg Per Tube Daily  . feeding supplement (PRO-STAT SUGAR FREE 64)  30 mL Per Tube Daily  . insulin aspart  0-15 Units Subcutaneous Q4H  . levalbuterol  0.63 mg Nebulization TID  . mouth rinse  15 mL Mouth Rinse QID  . multivitamin  15 mL Per Tube Daily  . sodium chloride flush  10-40 mL Intracatheter Q12H  . Warfarin - Pharmacist Dosing  Inpatient   Does not apply q1800    Infusions: . sodium chloride 250 mL (10/07/17 2120)  . feeding supplement (VITAL AF 1.2 CAL) 1,000 mL (10/10/17 0453)    PRN Medications: sodium chloride, bisacodyl, fentaNYL (SUBLIMAZE) injection, levalbuterol, metoprolol tartrate, midazolam, polyethylene glycol, sodium chloride flush  Patient Profile   Curtis Keller is a 61 y.o. male with history of COPD, Systolic CHF EF 22%, CAD, hx of CVA with left sided weakness, h/o of prolonged vent wean s/p trach, peg s/p decannulation, h/o Hepatitis C, as well as known apical mural thrombus admitted with worsening SOB and AMS  Found to have MRSA PNA and C. Perfringens + MRSA bacteremia.   Assessment/Plan   1. Septic shock: Hypotensive initially, MRSA PNA with MRSA and C perfringens in blood.  He is now off pressors.  - Stable today.  2. Acute hypoxemic respiratory failure: MRSA PNA.  He now has a tracheostomy.   - Failed wean 10/09/2017 - Trying to wean.   3. Acute on chronic systolic CHF: History of ischemic cardiomyopathy.  TEE this admission with EF 20-25%. CVP 6. Stop IV lasix. Start lasix 80 mg po daily.  - No spiro for now with AKI.  - Continue coreg 12.5 mg twice a day. SBP soft will not up titrate.     - No ARB/ACEI/ARNI with elevated creatinine.  3. PVCs/short episodes of NSVT. 12/11 30 beats NSVT   - Not ICD candidate given baseline debilitation (left hemiparesis, PEG, living in SNF).  - K 4.1. Check Mag in am.  -yesterday started amio 200 mg twice a day. ~20 PVCs per hour.  Continue current dose of amiodarone.  -Continue current dose of carvedilol. .   4. ID: No endocarditis on TEE.  Had MRSA PNA with MRSA and C perfringens in blood.  - Per primary.  - Afebrile.  5. CAD: Has history of BMS to RCA in 2016.  Troponin to 2.6 this admission, could be demand ischemia from septic shock.   - Continue statin.  - No ASA with full anticoagulation.  - Consider eventual cath but would need to see  improved renal function.  6. AKI on CKD III - Creatinine unchanged 2.3. .BMET in am.  7. LV thrombus: Chronic warfarin, TEE appeared to show a small calcified LV thrombus.   - Started back on coumadin 10/05/17. INR therapeutic 2.1.  Pharmacy dosing coumadin.   Length of Stay: [place], NP  10/10/2017, 7:52 AM  Advanced Heart Failure Team Pager [phone] (M-F; 7a - 4p)  Please contact [facility] for night-coverage after hours (4p -7a ) and weekends on [email or web address]  Patient seen with NP, agree with the above note.    Started on amiodarone yesterday, still with PVCs.  Continue 200 mg bid.   CVP 5-6 today, can transition to po Lasix 80 mg daily.   INR therapeutic, can stop heparin gtt.   Weaning vent per pulmonary.   Loralie Champagne 10/10/2017 8:47 AM

## 2017-10-10 NOTE — Procedures (Signed)
Sutures removed and trach changed at bedside to #6XLT by MD without difficulty. Equal BBS, +Etco2 color change.  ATC trial attempted post change but pt did not tolerate due to increased WOB & RR.  Pt placed back on PS 10/5 and is tolerating well, no distress noted.  RT will monitor.

## 2017-10-10 NOTE — Progress Notes (Signed)
Physical Therapy Wound Treatment Patient Details  Name: Curtis Keller MRN: [id number] Date of Birth: 12-Jan-1956  Today's Date: 10/10/2017 Time: 1010-1052 Time Calculation (min): 42 min  Subjective  Subjective: trached, but alert Patient and Family Stated Goals: pt agreed to the hydrotherapy  Pain Score:  premedicated, pt reacting at times to debridement   Wound Assessment  Pressure Injury 09/20/17 Unstageable - Full thickness tissue loss in which the base of the ulcer is covered by slough (yellow, tan, gray, green or brown) and/or eschar (tan, brown or black) in the wound bed. previous DTI to bilat buttocks and sacrum has  (Active)  Dressing Type ABD;Barrier Film (skin prep);Gauze (Comment);Moist to dry 10/10/2017 10:59 AM  Dressing Clean;Dry;Intact 10/10/2017 10:59 AM  Dressing Change Frequency Daily 10/10/2017 10:59 AM  State of Healing Early/partial granulation 10/10/2017 10:59 AM  Site / Wound Assessment Yellow;Pink 10/10/2017 10:59 AM  % Wound base Red or Granulating 25% 10/10/2017 10:59 AM  % Wound base Yellow/Fibrinous Exudate 75% 10/10/2017 10:59 AM  % Wound base Black/Eschar 0% 10/10/2017 10:59 AM  % Wound base Other/Granulation Tissue (Comment) 0% 10/10/2017 10:59 AM  Peri-wound Assessment Erythema (blanchable) 10/10/2017 10:59 AM  Wound Length (cm) 9 cm 10/05/2017 12:46 PM  Wound Width (cm) 8 cm 10/05/2017 12:46 PM  Wound Depth (cm) 0 cm 10/05/2017 12:46 PM  Wound Surface Area (cm^2) 72 cm^2 10/05/2017 12:46 PM  Wound Volume (cm^3) 0 cm^3 10/05/2017 12:46 PM  Margins Unattached edges (unapproximated) 10/10/2017 10:59 AM  Drainage Amount Minimal 10/10/2017 10:59 AM  Drainage Description Serous 10/10/2017 10:59 AM  Treatment Cleansed;Debridement (Selective);Hydrotherapy (Pulse lavage);Packing (Saline gauze) 10/10/2017 10:59 AM  Santyl applied to wound bed prior to applying dressing.    Hydrotherapy Pulsed lavage therapy - wound location: sacral Pulsed Lavage with  Suction (psi): 8 psi Pulsed Lavage with Suction - Normal Saline Used: 1000 mL Pulsed Lavage Tip: Tip with splash shield Selective Debridement Selective Debridement - Location: sacral Selective Debridement - Tools Used: Forceps;Scalpel Selective Debridement - Tissue Removed: lightly adhered slough/yellow eschar   Wound Assessment and Plan  Wound Therapy - Assess/Plan/Recommendations Wound Therapy - Clinical Statement: pt's wound will benefit from hydro therapy for PLS to decrease bio-burdin and soften eschar for debridement.  At this point expect a large portion to be superficial, but the middle is unstageable. Wound Therapy - Functional Problem List: lack of mobility on trach at present Factors Delaying/Impairing Wound Healing: Immobility;Multiple medical problems Hydrotherapy Plan: Debridement;Dressing change;Pulsatile lavage with suction Wound Therapy - Frequency: 6X / week Wound Therapy - Current Recommendations: PT Wound Therapy - Follow Up Recommendations: Skilled nursing facility Wound Plan: see above  Wound Therapy Goals- Improve the function of patient's integumentary system by progressing the wound(s) through the phases of wound healing (inflammation - proliferation - remodeling) by: Decrease Necrotic Tissue to: 20% Decrease Necrotic Tissue - Progress: Progressing toward goal Increase Granulation Tissue to: 80% Increase Granulation Tissue - Progress: Progressing toward goal(slowly uncovering healthy tissue, eschar layer tenacious) Goals/treatment plan/discharge plan were made with and agreed upon by patient/family: Yes Time For Goal Achievement: 7 days Wound Therapy - Potential for Goals: Good  Goals will be updated until maximal potential achieved or discharge criteria met.  Discharge criteria: when goals achieved, discharge from hospital, MD decision/surgical intervention, no progress towards goals, refusal/missing three consecutive treatments without notification or medical  reason.  GP     Tessie Fass Myrikal Messmer 10/10/2017, 11:01 AM 10/10/2017  Donnella Sham, [place] [phone]  (pager)

## 2017-10-10 NOTE — Plan of Care (Signed)
Pt has pressure injury on buttocks/sacrum. Pt receives hydrotherapy mon-sat, and daily wound care changes (santyl>wet to dry gauze>abd pad). Wound appears to be healing and is yellow and pink

## 2017-10-10 NOTE — Clinical Social Work Note (Signed)
CSW continues to follow for discharge needs. Per RNCM note, patient's daughter will discuss possible LTACH admission with hospital liaison for [facility] today.  Charlynn Court, CSW [phone]

## 2017-10-10 NOTE — Progress Notes (Addendum)
PULMONARY / CRITICAL CARE MEDICINE   Name: Curtis Keller MRN: [id number] DOB: 17-Sep-1956    ADMISSION DATE:  09/20/2017 CONSULTATION DATE:  10/20/2017  REFERRING MD:  ED  CHIEF COMPLAINT:  Shortness of breath  Brief:   61 yr old M, SNF resident, with PMH of COPD, CHF EF 20%, CAD, apical mural thrombus   Admitted 11/28 with reports of altered mental status for 2 days. Found to have MRSA PNA. Intubated x 3 and ultimately trached on 12/7.   SUBJECTIVE:   Trach leaking overnight.  VITAL SIGNS: BP 99/68   Pulse 62   Temp 99 F (37.2 C) (Oral)   Resp 12   Ht 6' (1.829 m)   Wt 90.4 kg (199 lb 4.7 oz)   SpO2 100%   BMI 27.03 kg/m   HEMODYNAMICS: CVP:  [5 mmHg-14 mmHg] 6 mmHg  VENTILATOR SETTINGS: Vent Mode: PSV;CPAP FiO2 (%):  [40 %] 40 % Set Rate:  [16 bmp] 16 bmp Vt Set:  [620 mL] 620 mL PEEP:  [5 cmH20] 5 cmH20 Pressure Support:  [5 cmH20-12 cmH20] 12 cmH20 Plateau Pressure:  [19 cmH20] 19 cmH20  INTAKE / OUTPUT: I/O last 3 completed shifts: In: 3133.2 [I.V.:1153.2; NG/GT:1980] Out: 4125 [Urine:4125]  PHYSICAL EXAMINATION: General: Chronically ill appearing male, NAD, audible trach leak HEENT: [place]/AT, PERRL, EOM-I and MMM Neuro: Awake and interactive, moving all ext to command PULM: CTA bilaterally GI: Soft, NT, ND and +BS Extremities: -edema and -tenderness Skin: Decub noted  LABS:  BMET Recent Labs  Lab 10/08/17 0344 10/09/17 0451 10/10/17 0445  NA 143 143 141  K 4.0 4.1 4.1  CL 108 107 105  CO2 27 27 27   BUN 65* 68* 68*  CREATININE 2.30* 2.31* 2.34*  GLUCOSE 104* 97 100*   Electrolytes Recent Labs  Lab 10/07/17 0522 10/08/17 0344 10/09/17 0451 10/10/17 0445  CALCIUM 8.8* 9.1 9.5 9.4  MG 2.1 2.2 2.3  --    CBC Recent Labs  Lab 10/08/17 0344 10/09/17 0451 10/10/17 0445  WBC 9.6 11.4* 10.2  HGB 10.0* 10.6* 9.2*  HCT 31.5* 33.3* 29.2*  PLT 300 285 262   Coag's Recent Labs  Lab 10/08/17 0344 10/09/17 0451 10/10/17 0445   INR 1.35 1.75 2.09    Sepsis Markers No results for input(s): LATICACIDVEN, PROCALCITON, O2SATVEN in the last 168 hours. ABG No results for input(s): PHART, PCO2ART, PO2ART in the last 168 hours. Liver Enzymes Recent Labs  Lab 10/10/17 0445  AST 17  ALT 10*  ALKPHOS 47  BILITOT 0.6  ALBUMIN 2.1*   Cardiac Enzymes No results for input(s): TROPONINI, PROBNP in the last 168 hours. Glucose Recent Labs  Lab 10/09/17 1122 10/09/17 1600 10/09/17 2018 10/10/17 0019 10/10/17 0344 10/10/17 0735  GLUCAP 106* 115* 119* 100* 91 94   Imaging No results found.  CULTURES Sputum culture 11/28 > MRSA BCx2 11/28 > MRSA, C perfringens BCx2 11/30 > negative Urine 11/29>>> neg   ANTIBIOTICS: Zosyn 11/28 > 11/29 Vanc 11/28 >> Flagyl 11/30 >>   EVENTS 11/28  Admit 12/03  Extubated  12/04  Re-intubated (suspect medication related) 12/05  TEE   STUDIES TEE 12/6 >> diffuse hypokinesis, LVEF 20-25%, may be a small calcified LV thrombus at the apex, no vegetation  LINES RUE PICC 12/4 >> ETT 12/2 > 12/3 ETT 12/4 > 12/6 ETT 12/7 > 12/7 Trach 12/7 Shiley 8 cuffed >>  ASSESSMENT / PLAN: 61 year old male with chronic respiratory failure that is now trached.  Tolerating  PS trials this AM.  On exam, more alert and interactive, following command with coarse BS diffusely.  I reviewed CXR myself, trach is in good position.  Dicussed with PCCM-NP and TRH-MD.  Respiratory failure:  - Continue PS trials as able  - Done well, advance to TC as tolerated  Hypoxemia:  - Titrate O2 for sat of 88-92%.  Trach status:  - Change trach to a distal XLT cuffed  - No decannulation as this is the patient's 3rd trach  - May be discharged to a weaning SNF  Patient being discharged, PCCM will sign off and will arrange f/u in the trach clinic.  Alyson ReedyWesam G. Nolene Rocks, M.D. [facility]. Pager: [phone]. After hours pager: [phone].

## 2017-10-10 NOTE — Consult Note (Addendum)
WOC follow-up: Sacrum wound remains unstageable; 100% tightly adhered yellow slough with mod amt yellow drainage, no odor.  Continue present plan of care with PT for hydrotherapy and sharp debridement to assist with removal of nonviable tissue, and Santyl ointment for enzymatic debridement.  Optimal plan of care would be to continue hydrotherapy at a facility when the patient is discharged. Refer to PT notes for measurements.  Pt is on a low airloss bed to reduce pressure. No family present ot discuss plan of care. Cammie Mcgee MSN, RN, CWOCN, [place], CNS [phone]

## 2017-10-10 NOTE — Discharge Instructions (Signed)

## 2017-10-10 NOTE — Care Management Important Message (Signed)
Important Message  Patient Details  Name: AMRIT NEVELLS MRN: [id number] Date of Birth: [birth year]   Medicare Important Message Given:  Yes    [name], RN 10/10/2017, 2:46 PM

## 2017-10-10 NOTE — Discharge Summary (Signed)
DISCHARGE SUMMARY  Curtis Keller  MR#: [id number]  DOB:Jun 04, 1956  Date of Admission: 09/20/2017 Date of Discharge: 10/10/2017  Attending Physician:Jeffrey T Sharon SellerMcClung  Patient's VOZ:DGUYQIPCP:[facility]  Consults: PCCM CHF Team  ID   [facility]  Discharge Diagnoses: Septic Shock- Clostridium perfringens and MRSA bacteremia  MRSA pneumonia / Acute Lung Injury - Acute hypoxic resp failure  Bilateral Lower Lobe Collapse / Consolidation Bilateral Pleural Effusion LEFT > RIGHT Chronic Systolic CHF (EF 34%20%) Elevated troponin Frequent runs of NSVT LEFT ventricular apical thrombus Anemia of Chronic Disease  Acute on CKD stage III (baseline Cr~1.87) Hx of Hep C DM2  Sacral DecubitusUlcer Mechanical Falls with Prior Hip Fracture  Initial presentation: 61 yr old M SNF resident with a Hxof COPD (on home oxygen therapy), Ischemic cardiomyopathy(EF 20%), CAD w/ MI,Apical muralLVthrombus, HTN,Anemia, Stroke (12/19/2015)with residual L hemiplegia, Depression, Dementia,Anxiety, Dysphagia, Hepatitis C, gout, HLD, Protein calorie malnutrition, and DM2 who was admitted 11/28 with altered mental status for 2 days.  He was found to have MRSA PNA, was intubated, and ultimately trached.    Hospital Course: 11/28 admit  12/3 extubated  12/4 re-intubated 12/5 TTE - no vegetation - EF 20-25% 12/6 extubated 12/7 re-intubated > trach  Septic Shock- Clostridium perfringens and MRSA bacteremia  Completedcourse of antibiotics - sepsis physiology resolved - no evidence of active infection at time of d/c   MRSA pneumonia / Acute Lung Injury - Acute hypoxic resp failure  Completed course antibiotics - continues to be vent dependent - PCCM recommends ongoing PS weaning trials - suggestion is that he NOT be decannulated - [facility] trach changed to distal #6 XLT cuffed on day of d/c by PCCM  Bilateral Lower Lobe  Collapse / Consolidation CT chest 12/16 consistent with BLL mucous plugging resulting in bilateral lower lobe collapse/consolidation - ongoing chest PT and suctioning will be required  Bilateral Pleural Effusion LEFT > RIGHT Monitor - clinically insignif at this time - due to above + CHF - follow w/ diuresis   Chronic Systolic CHF (EF 74%20%) Transitioned to oral lasix at time of d/c - monitor daly weights and Is/Os  Elevated troponin Most c/w demand ischemia - no further w/u planned per Cards   Frequent runs of NSVT amio initiated per CHF Team - on BB    LEFT ventricular apical thrombus Heparin drip > warfarin therapy per Pharmacy - INR at goal at time of d/c   Anemia of Chronic Disease  Hgb stable at time of d/c   Acute on CKD stage III (baseline Cr~1.87) crt stable th/o this hospital stay but not at his baseline - follow trend as an outpt   Hx of Hep C HCV quantitative negative  DM2  12/13 A1c 10.1 - CBG well controlled   Sacral DecubitusUlcer Treatment per WOC recommendations to include hydrotherapy/pulsatile lavage 6x a week - santyl to wound on sacrum/buttocks after hydro then cover w/ moist fluffed gauze and ABD pad and tape   Mechanical Falls with Prior Hip Fracture  Allergies as of 10/10/2017      Reactions   Codeine Anaphylaxis, Nausea And Vomiting   Lisinopril Itching, Rash   Hydrocodone Other (See Comments)   On MAR   Hydrocodone-acetaminophen Nausea And Vomiting   Cannot take any acetaminophen due to liver issues per sister   Tegaderm Ag Mesh [silver] Other (See Comments)   Allergy to "silver compounds" listed on MAR   Tylenol [  acetaminophen] Nausea Only, Other (See Comments)   Cannot take any acetaminophen due to liver issues per sister      Medication List    STOP taking these medications   acetylcysteine 20 % nebulizer solution Commonly known as:  MUCOMYST   albuterol (2.5 MG/3ML) 0.083% nebulizer solution Commonly known as:   PROVENTIL   busPIRone 5 MG tablet Commonly known as:  BUSPAR   cyanocobalamin 1000 MCG tablet   famotidine 20 MG tablet Commonly known as:  PEPCID Replaced by:  famotidine 40 MG/5ML suspension   Fish Oil 1000 MG Caps   folic acid 1 MG tablet Commonly known as:  FOLVITE   free water Soln   guaiFENesin 200 MG tablet   haloperidol decanoate 100 MG/ML injection Commonly known as:  HALDOL DECANOATE   HYDROCORTISONE (TOPICAL) 1 % Gel   ipratropium-albuterol 0.5-2.5 (3) MG/3ML Soln Commonly known as:  DUONEB   lidocaine 5 % Commonly known as:  LIDODERM   metoprolol tartrate 25 MG tablet Commonly known as:  LOPRESSOR   multivitamin with minerals Tabs tablet   senna 8.6 MG Tabs tablet Commonly known as:  SENOKOT   tamsulosin 0.4 MG Caps capsule Commonly known as:  FLOMAX   tizanidine 6 MG capsule Commonly known as:  ZANAFLEX     TAKE these medications   amiodarone 200 MG tablet Commonly known as:  PACERONE Place 1 tablet (200 mg total) into feeding tube 2 (two) times daily.   atorvastatin 40 MG tablet Commonly known as:  LIPITOR Place 1 tablet (40 mg total) into feeding tube at bedtime. What changed:  additional instructions   bisacodyl 10 MG suppository Commonly known as:  DULCOLAX Place 1 suppository (10 mg total) rectally daily as needed for moderate constipation.   carvedilol 12.5 MG tablet Commonly known as:  COREG Place 1 tablet (12.5 mg total) into feeding tube 2 (two) times daily with a meal.   collagenase ointment Commonly known as:  SANTYL Apply topically daily. Start taking on:  10/11/2017   diazepam 2 MG tablet Commonly known as:  VALIUM Place 1 tablet (2 mg total) into feeding tube daily. Start taking on:  10/11/2017 What changed:    medication strength  how much to take  how to take this  when to take this   famotidine 40 MG/5ML suspension Commonly known as:  PEPCID Place 2.5 mLs (20 mg total) into feeding tube daily. Start  taking on:  10/11/2017 Replaces:  famotidine 20 MG tablet   feeding supplement (PRO-STAT SUGAR FREE 64) Liqd Place 30 mLs into feeding tube daily. Start taking on:  10/11/2017 What changed:    how much to take  when to take this   feeding supplement (VITAL AF 1.2 CAL) Liqd Place 1,000 mLs into feeding tube continuous.   fentaNYL 100 MCG/2ML injection Commonly known as:  SUBLIMAZE Inject 2 mLs (100 mcg total) into the vein every 2 (two) hours as needed for severe pain.   furosemide 80 MG tablet Commonly known as:  LASIX Take 1 tablet (80 mg total) by mouth daily. Start taking on:  10/11/2017   insulin aspart 100 UNIT/ML injection Commonly known as:  novoLOG Inject 0-15 Units into the skin every 4 (four) hours.   levalbuterol 0.63 MG/3ML nebulizer solution Commonly known as:  XOPENEX Take 3 mLs (0.63 mg total) by nebulization every 3 (three) hours as needed for wheezing.   levalbuterol 0.63 MG/3ML nebulizer solution Commonly known as:  XOPENEX Take 3 mLs (0.63 mg total) by  nebulization 3 (three) times daily.   midazolam 2 MG/2ML Soln injection Commonly known as:  VERSED Inject 1-4 mLs (1-4 mg total) into the vein every hour as needed for agitation or sedation.   multivitamin Liqd Place 15 mLs into feeding tube daily. Start taking on:  10/11/2017   polyethylene glycol packet Commonly known as:  MIRALAX / GLYCOLAX Take 17 g by mouth daily as needed for mild constipation. What changed:    how to take this  when to take this  reasons to take this  additional instructions   warfarin 5 MG tablet Commonly known as:  COUMADIN Take 5 mg by mouth daily.       Day of Discharge BP 114/82 (BP Location: Left Arm)   Pulse 61   Temp (!) 97.4 F (36.3 C) (Axillary)   Resp 19   Ht 6' (1.829 m)   Wt 90.4 kg (199 lb 4.7 oz)   SpO2 95%   BMI 27.03 kg/m   Physical Exam: General: No acute respiratory distress on vent - minimal amount of bleeding from trach site   Lungs: Clear to auscultation bilaterally without wheezes or crackles - poor air movement B bases Cardiovascular: Regular rate without murmur gallop or rub normal S1 and S2 Abdomen: Nontender, nondistended, soft, bowel sounds positive, no rebound, no ascites, no appreciable mass Extremities: 1+ B LE edema   Basic Metabolic Panel: Recent Labs  Lab 10/04/17 0900  10/06/17 0414 10/07/17 0522 10/08/17 0344 10/09/17 0451 10/10/17 0445  NA 144   < > 143 141 143 143 141  K 4.3   < > 4.1 3.9 4.0 4.1 4.1  CL 112*   < > 110 108 108 107 105  CO2 26   < > 27 26 27 27 27   GLUCOSE 104*   < > 115* 105* 104* 97 100*  BUN 57*   < > 60* 65* 65* 68* 68*  CREATININE 1.83*   < > 2.18* 2.36* 2.30* 2.31* 2.34*  CALCIUM 8.7*   < > 9.0 8.8* 9.1 9.5 9.4  MG 1.9  --  2.1 2.1 2.2 2.3  --    < > = values in this interval not displayed.    Liver Function Tests: Recent Labs  Lab 10/10/17 0445  AST 17  ALT 10*  ALKPHOS 47  BILITOT 0.6  PROT 6.6  ALBUMIN 2.1*    Coags: Recent Labs  Lab 10/06/17 0414 10/07/17 0522 10/08/17 0344 10/09/17 0451 10/10/17 0445  INR 1.35 1.32 1.35 1.75 2.09    CBC: Recent Labs  Lab 10/06/17 0414 10/07/17 0522 10/08/17 0344 10/09/17 0451 10/10/17 0445  WBC 9.7 10.9* 9.6 11.4* 10.2  HGB 9.8* 9.6* 10.0* 10.6* 9.2*  HCT 30.8* 30.6* 31.5* 33.3* 29.2*  MCV 96.3 95.9 96.3 96.5 95.4  PLT 294 285 300 285 262    CBG: Recent Labs  Lab 10/09/17 2018 10/10/17 0019 10/10/17 0344 10/10/17 0735 10/10/17 1209  GLUCAP 119* 100* 91 94 87    Time spent in discharge (includes decision making & examination of pt): 35 minutes  10/10/2017, 1:52 PM   Lonia Blood, MD [facility] Office  [phone] Pager [phone]  On-Call/Text Page:      [email or web address]      password [facility]

## 2017-10-10 NOTE — Procedures (Signed)
Per MD note will hold ATC trials but proceed with CPAP/PS as tolerated.  Pt placed on PS trial at this time.  Increased PS to 12 due to decreased VT's. Pt remains on 12/5 PS tolerating well, RT will monitor.

## 2017-10-10 NOTE — Progress Notes (Signed)
Patient discharged to [facility] via [facility]. VSS. Sister was in the room when patient left. Report called to Primitivo Gauze, RN at [facility].

## 2017-10-10 NOTE — Procedures (Signed)
First Trach Change  Sutures removed, size 6 distal cuffed XLT trach placed after removal of the shiley without difficulty.  Bilateral BS heard and good color change.  Alyson Reedy, M.D. [facility] Pulmonary/Critical Care Medicine. Pager: [phone]. After hours pager: [phone].

## 2017-10-10 NOTE — Progress Notes (Signed)
ANTICOAGULATION CONSULT NOTE Pharmacy Consult:  Heparin/warfarin  Indication:  History of atrial thrombus  Patient Measurements: Height: 6' (182.9 cm) Weight: 199 lb 4.7 oz (90.4 kg) IBW/kg (Calculated) : 77.6  Heparin Dosing weight: 91.8 kg  Vital Signs: Temp: 99 F (37.2 C) (12/18 0742) Temp Source: Oral (12/18 0742) BP: 93/66 (12/18 0742) Pulse Rate: 63 (12/18 0742)  Labs: Recent Labs    10/08/17 0344  10/09/17 0451 10/09/17 1300 10/09/17 1459 10/10/17 0445  HGB 10.0*  --  10.6*  --   --  9.2*  HCT 31.5*  --  33.3*  --   --  29.2*  PLT 300  --  285  --   --  262  LABPROT 16.5*  --  20.3*  --   --  23.3*  INR 1.35  --  1.75  --   --  2.09  HEPARINUNFRC 0.78*   < > 0.82* 1.98* 0.55 0.38  CREATININE 2.30*  --  2.31*  --   --  2.34*   < > = values in this interval not displayed.   Assessment: 61 y.o. male with h/o atrial thrombus on IV heparin gtt. PTA warfarin restarted 12/14. The patient's home dose was noted to be 5 mg daily.  INR today is therapeutic (INR 2.09 << 1.75, goal of 2-3). The patient's heparin level this morning is also therapeutic (HL 0.38, goal of 0.3-0.7). Hgb/Hct slight drop, plts wnl - no overt s/sx of bleeding noted. Since INR>2 today, will discontinue the heparin bridge.   Amiodarone was noted to start on 12/17 which is a new medication for this patient and is known to increase warfarin sensitivity. The recommendation is to make an empiric dose reduction of 30-50%. Will start lowering the dose and monitor INR trends closely. Will attempt to dose warfarin safely while not allowing the INR to go <2 requiring restart of the heparin bridge.   Goal of Therapy:  INR 2-3 Heparin level 0.3-0.7 units/ml Monitor platelets by anticoagulation protocol: Yes  Plan:  - Discontinue Heparin - Warfarin 4 mg x 1 dose at 1800 today - Will continue to monitor for any signs/symptoms of bleeding and will follow up with PT/INR in the AM  Thank you for allowing pharmacy  to be a part of this patient's care.  Georgina Pillion, PharmD, BCPS Clinical Pharmacist Pager: [phone] Clinical phone for 10/10/2017 from 7a-3:30p: [phone] If after 3:30p, please call main pharmacy at: [phone] 10/10/2017 7:49 AM

## 2017-10-10 NOTE — Progress Notes (Signed)
Physical Therapy Discharge Patient Details Name: Curtis Keller MRN: [id number] DOB: [birth year] Today's Date: 10/10/2017 Time:  -     Patient discharged from PT services secondary to per old charts pt has been dependent with all mobility since February of 2017. Do not expect pt to progress with PT. For any OOB (doubt will tolerate with sacral wound) pt would need mechanical lift...  Please see latest therapy progress note for current level of functioning and progress toward goals.    [facility] PT [phone]   GP     Angelina Ok [facility] 10/10/2017, 10:42 AM

## 2017-10-18 DIAGNOSIS — R7981 Abnormal blood-gas level: Secondary | ICD-10-CM | POA: Diagnosis not present

## 2017-10-18 DIAGNOSIS — J810 Acute pulmonary edema: Secondary | ICD-10-CM | POA: Diagnosis not present

## 2017-12-25 DIAGNOSIS — D62 Acute posthemorrhagic anemia: Secondary | ICD-10-CM | POA: Diagnosis not present

## 2017-12-25 DIAGNOSIS — I509 Heart failure, unspecified: Secondary | ICD-10-CM | POA: Diagnosis not present

## 2017-12-25 DIAGNOSIS — I10 Essential (primary) hypertension: Secondary | ICD-10-CM | POA: Diagnosis not present

## 2017-12-25 DIAGNOSIS — M869 Osteomyelitis, unspecified: Secondary | ICD-10-CM | POA: Diagnosis not present

## 2018-01-22 DEATH — deceased

## 2018-03-02 IMAGING — CR DG CHEST 1V PORT
1 series · 1 of 1 positions shown · non-contrast
Comparison: Radiograph dated 01/12/2016

CLINICAL DATA: 59-year-old male with respiratory failure and
shortness of breath

EXAM:
PORTABLE CHEST 1 VIEW

[AP]
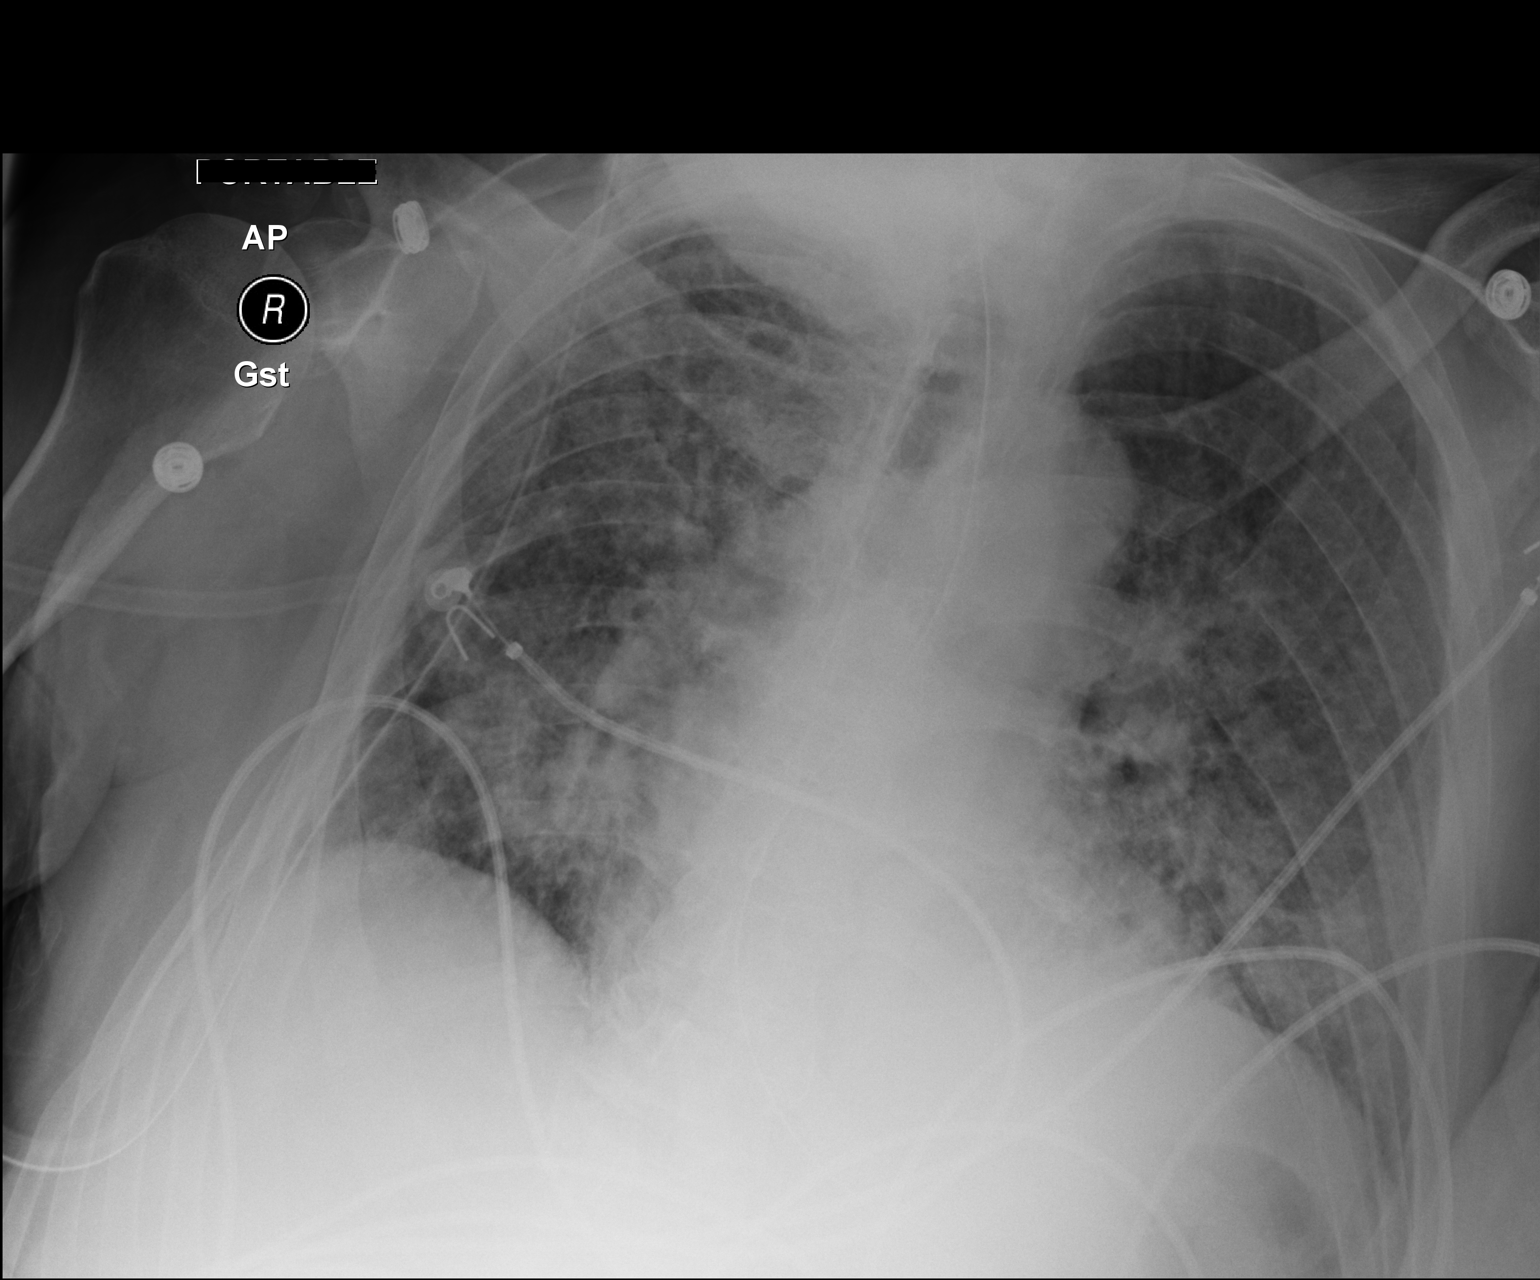

[1 of 1 positions shown; findings below may reference images not displayed]

FINDINGS: An enteric tube is partially visualized coursing towards the left
upper abdomen. There has been interval improvement of the previously
seen bilateral airspace opacities with improved aeration of the
lungs. There is no significant pleural effusion. No pneumothorax.
The cardiac silhouette is within normal limits. No acute osseous
pathology.
IMPRESSION: Interval improvement of the bilateral airspace opacities. Follow-up
recommended.

## 2018-03-02 IMAGING — CR DG ABD PORTABLE 1V
1 series · 1 of 1 positions shown · non-contrast
Comparison: Radiograph dated 01/13/2016

CLINICAL DATA: 59-year-old male with enteric tube placement.

EXAM:
PORTABLE ABDOMEN - 1 VIEW

[AP]
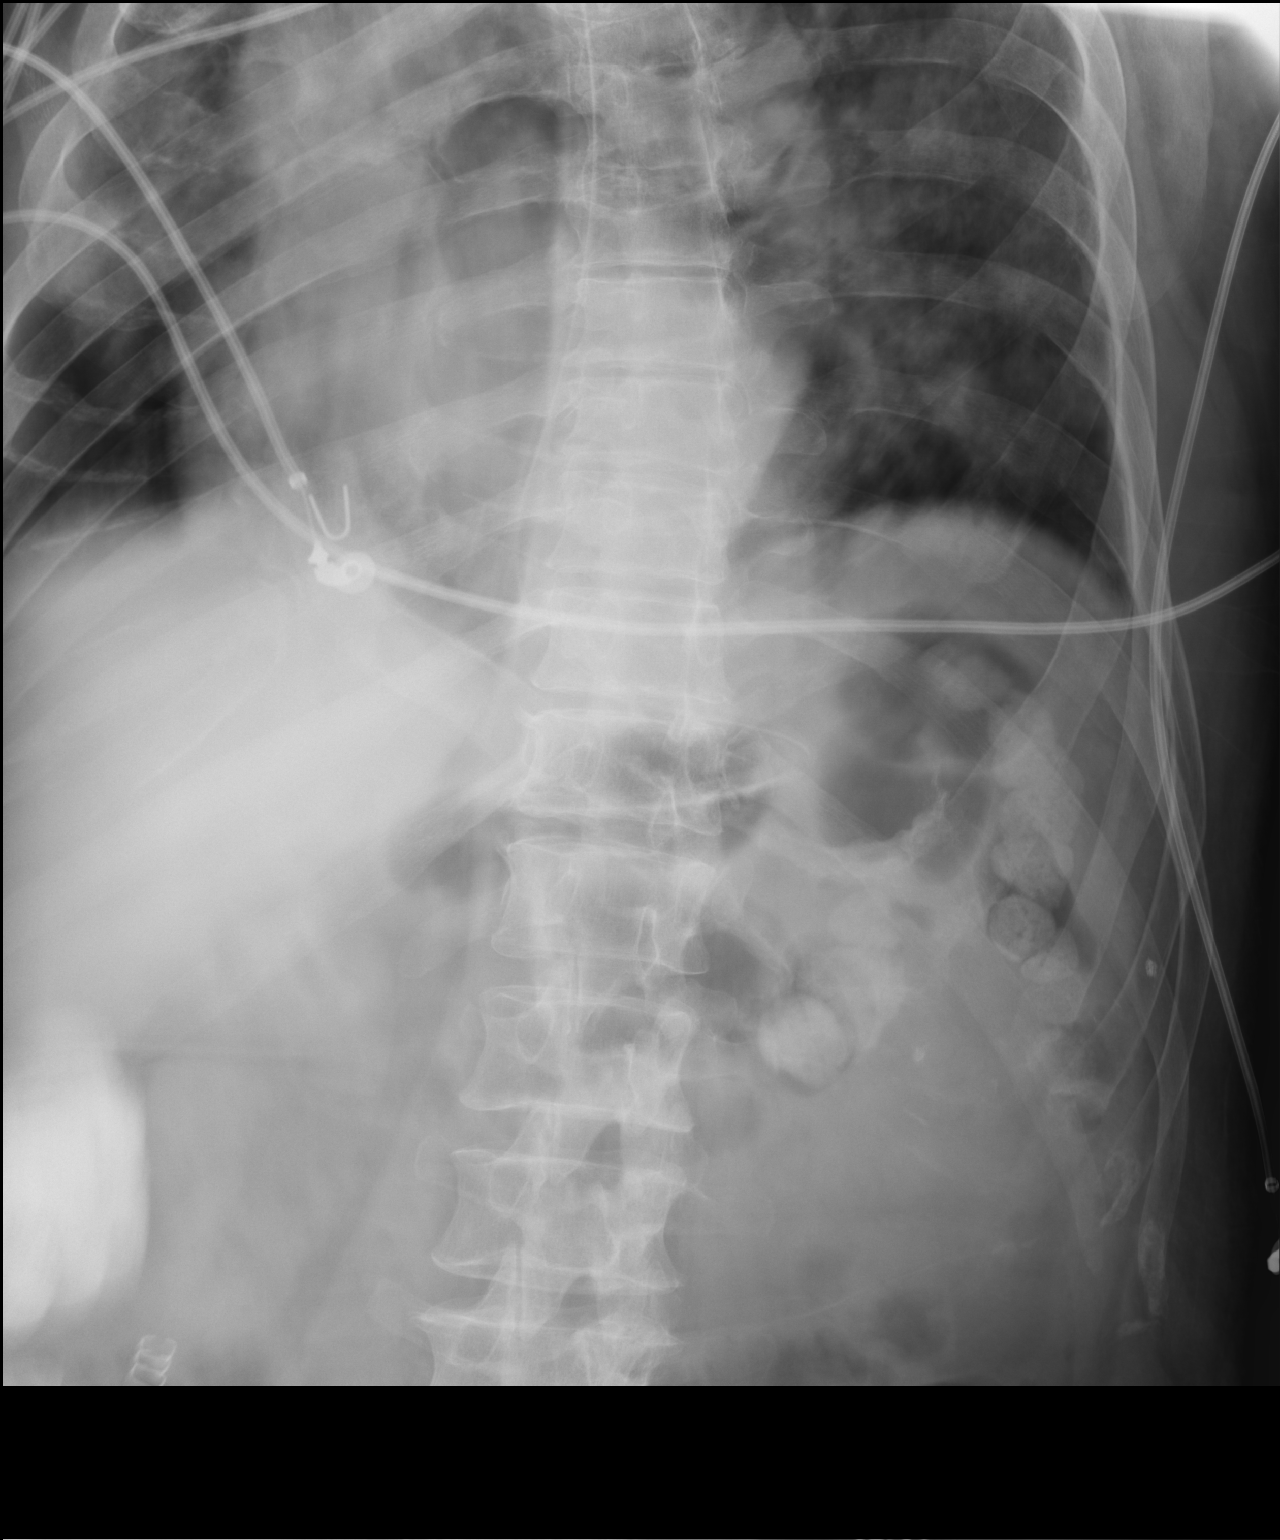

[1 of 1 positions shown; findings below may reference images not displayed]

FINDINGS: An enteric tube is noted with tip positioned over the inferior
aspect of the T12 vertebra likely within the stomach. There is no
evidence of bowel dilatation or free air. Patchy airspace densities
noted at the lung bases. Cutaneous surgical clips in the lower
abdomen.
IMPRESSION: Enteric tube in the epigastric area likely within the stomach.

## 2018-03-10 IMAGING — CR DG CHEST 1V PORT
1 series · 1 of 1 positions shown · non-contrast
Comparison: Portable exam 5463 hours compared to 01/14/2016

CLINICAL DATA: Respiratory failure

EXAM:
PORTABLE CHEST 1 VIEW

[AP]
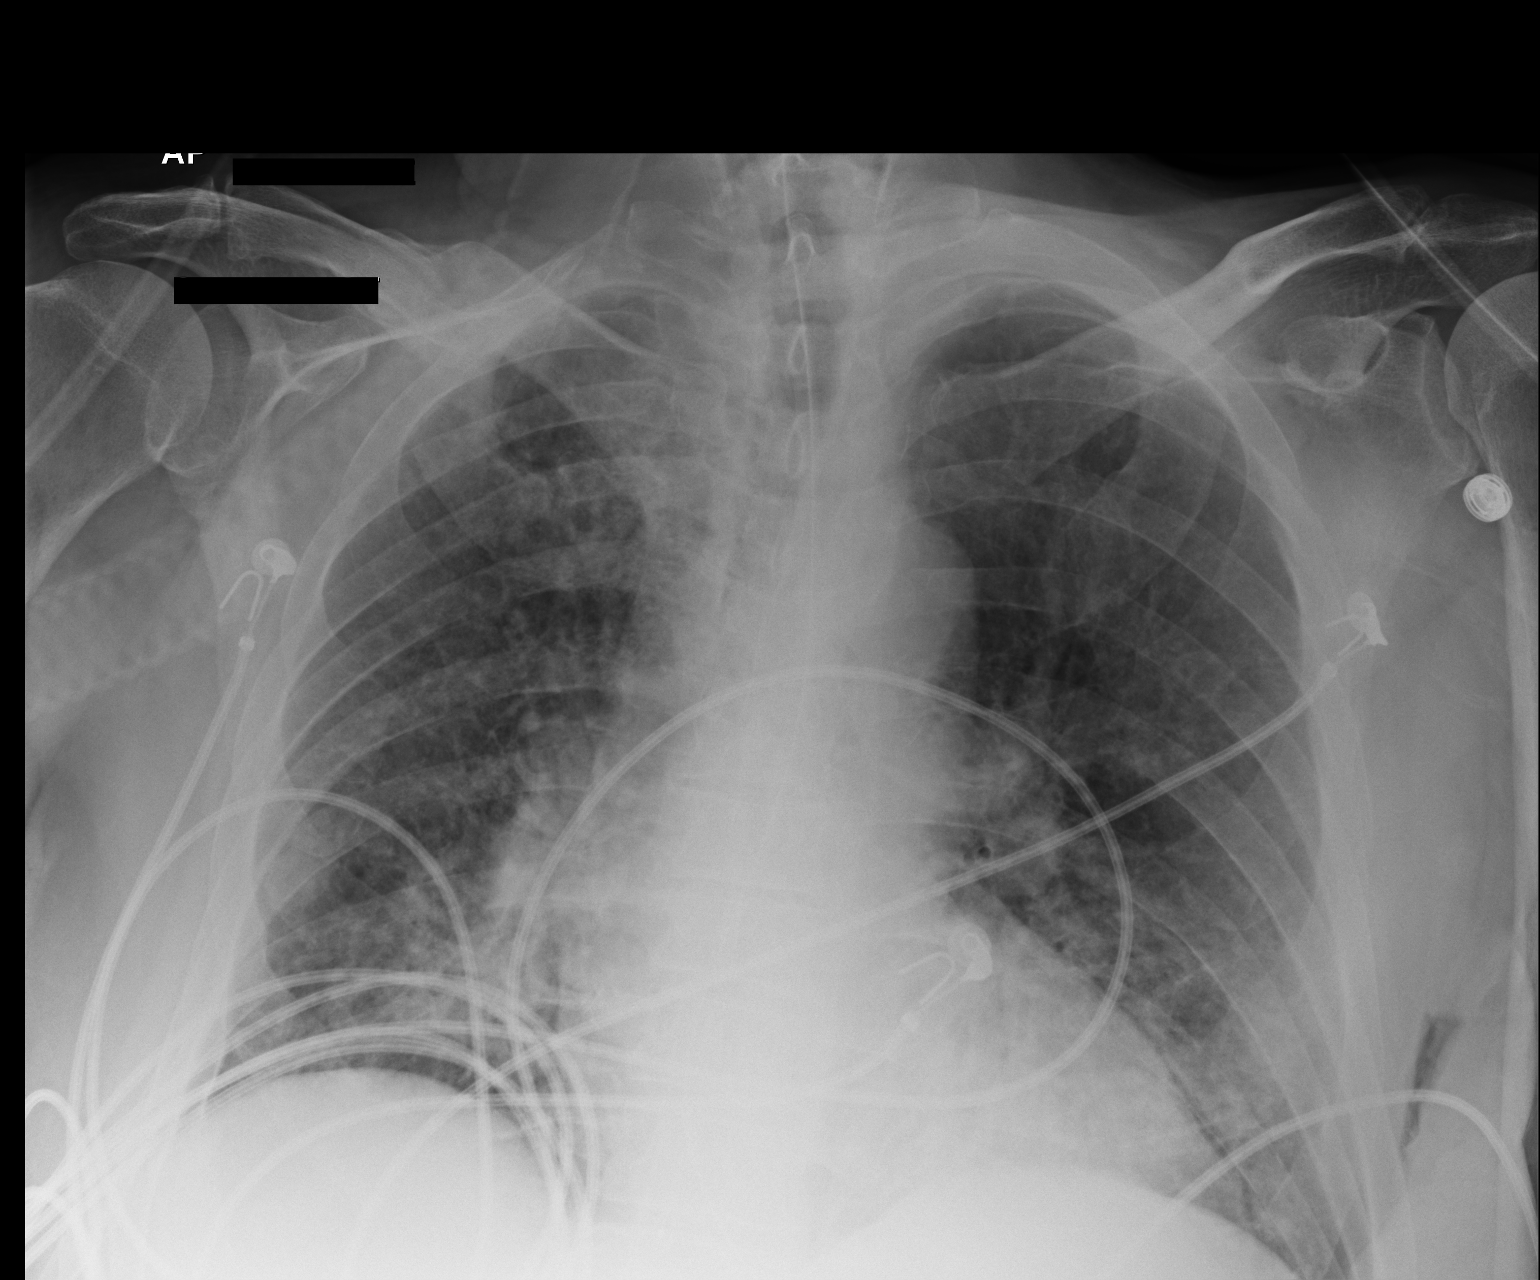

[1 of 1 positions shown; findings below may reference images not displayed]

FINDINGS: Nasogastric tube traverses chest.

Upper normal heart size.

Mediastinal contours and pulmonary vascularity normal.

Persistent bibasilar infiltrates.

No pleural effusion or pneumothorax.

No acute osseous findings.
IMPRESSION: Persistent bibasilar infiltrates.

## 2018-03-13 IMAGING — MR MR NECK WO/W CM
14 of 23 series · 28 of 48 positions shown · IV contrast (multihance)
Comparison: CT head and cervical spine January 04, 2016

CLINICAL DATA: Difficulty breathing, neck swelling.

EXAM:
MRI HEAD WITHOUT AND WITH CONTRAST
MRI NECK WITHOUT AND WITH CONTRAST
TECHNIQUE: Multiplanar, multiecho pulse sequences of the brain and neck were
obtained with and without intravenous contrast.
CONTRAST:  19mL MULTIHANCE GADOBENATE DIMEGLUMINE 529 MG/ML IV SOLN

[Series 3: T1 · sagittal · 5.0mm · 0.47mm/px · 2 of 27 slices shown (1 of 5)]
[im 1/27]
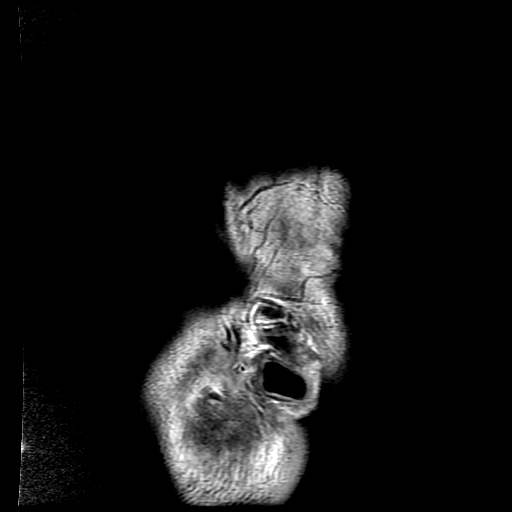
[im 27/27]
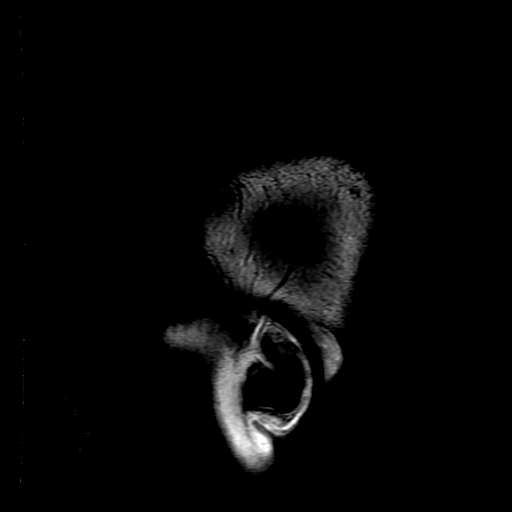

[Series 4: DWI · axial · 3.0mm · 1.09mm/px · z∈[-55,+107]mm · 6 of 110 slices shown (1 of 4)]
[im 1/110]
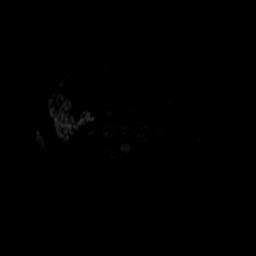
[im 22/110]
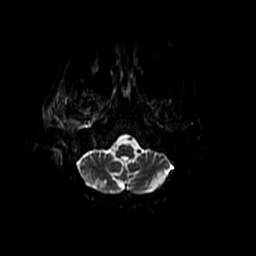
[im 44/110]
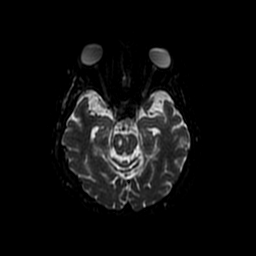
[im 66/110]
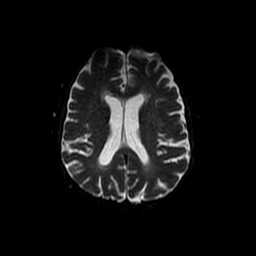
[im 88/110]
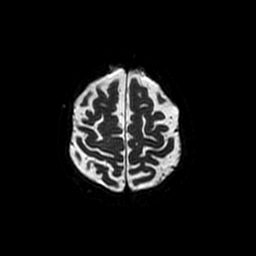
[im 110/110]
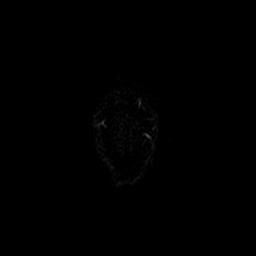

[Series 5: DWI · coronal · 5.0mm · 1.09mm/px · 4 of 70 slices shown (2 of 4)]
[im 1/70]
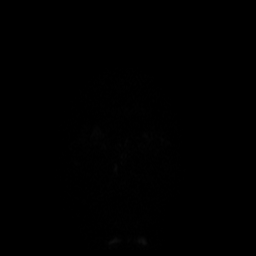
[im 24/70]
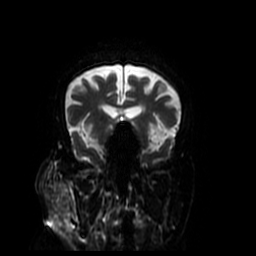
[im 47/70]
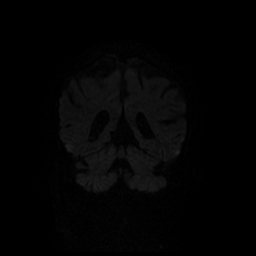
[im 70/70]
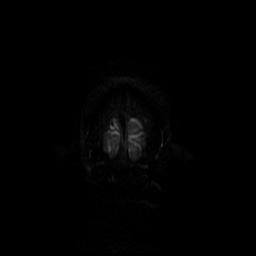

[Series 6: T2 · axial · 5.0mm · 0.43mm/px · z∈[-55,+107]mm · 2 of 28 slices shown (1 of 2)]
[im 1/28]
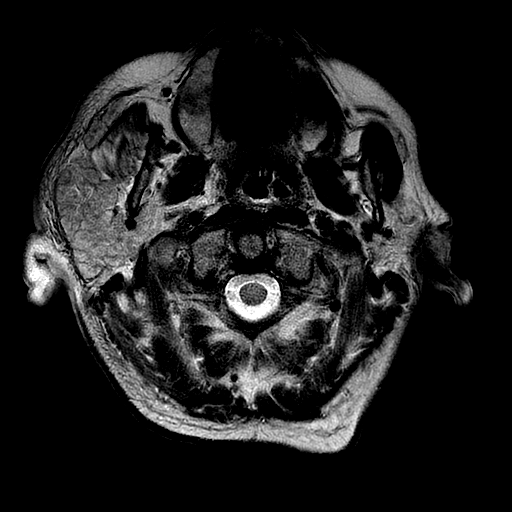
[im 28/28]
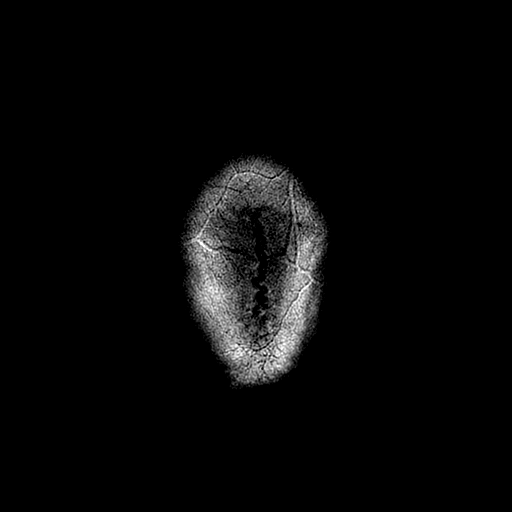

[Series 7: FLAIR · axial · 5.0mm · 0.43mm/px · 1 of 28 slices shown]
[im 1/28]
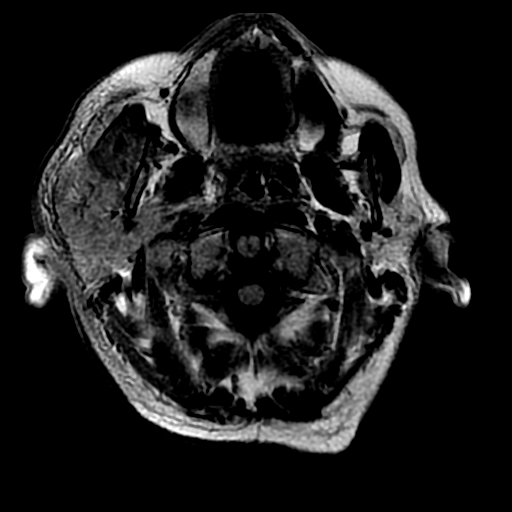

[Series 10: T2 · coronal · 5.0mm · 0.43mm/px · 1 of 30 slices shown (2 of 2)]
[im 1/30]
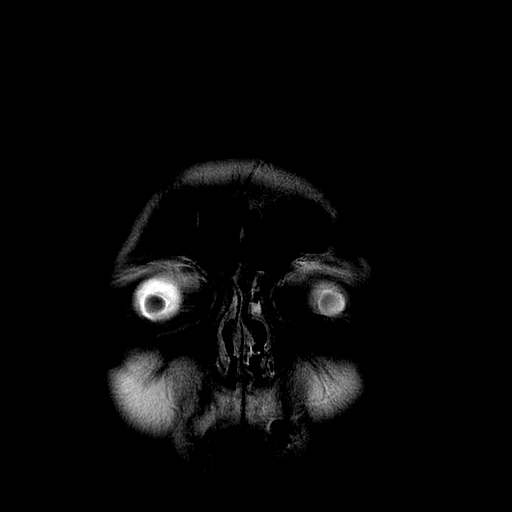

[Series 12: T1 · sagittal · 5.0mm · 0.51mm/px · 1 of 25 slices shown (2 of 5)]
[im 1/25]
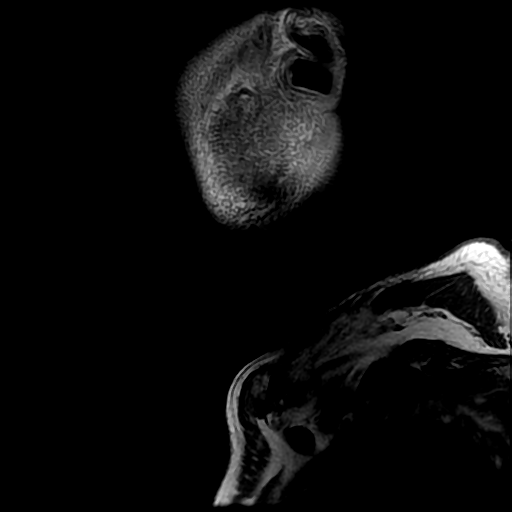

[Series 16: T1 · coronal · 5.0mm · 0.51mm/px · 1 of 27 slices shown (3 of 5)]
[im 1/27]
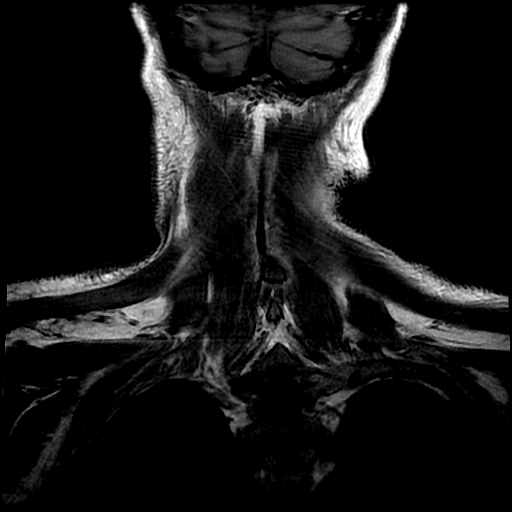

[Series 18: T1 · axial · 5.0mm · 0.43mm/px · z∈[-263,-11]mm · 2 of 43 slices shown (4 of 5)]
[im 1/43]
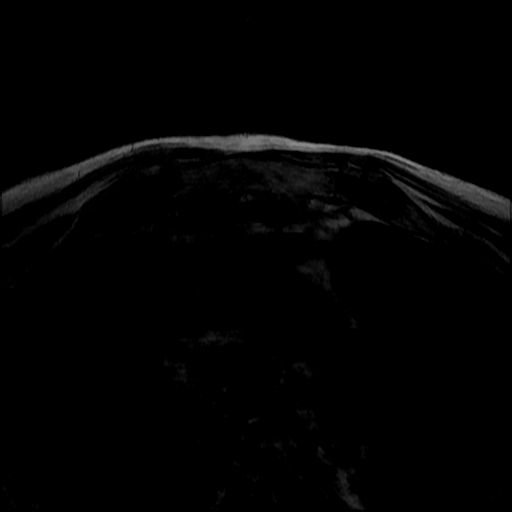
[im 43/43]
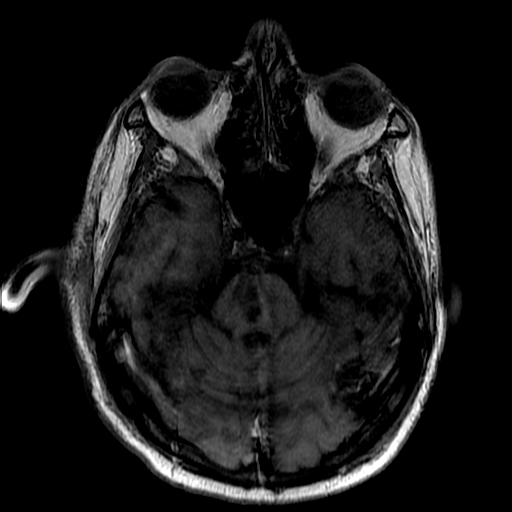

[Series 20: T1 · axial · 5.0mm · 0.43mm/px · 1 of 43 slices shown (5 of 5)]
[im 1/43]
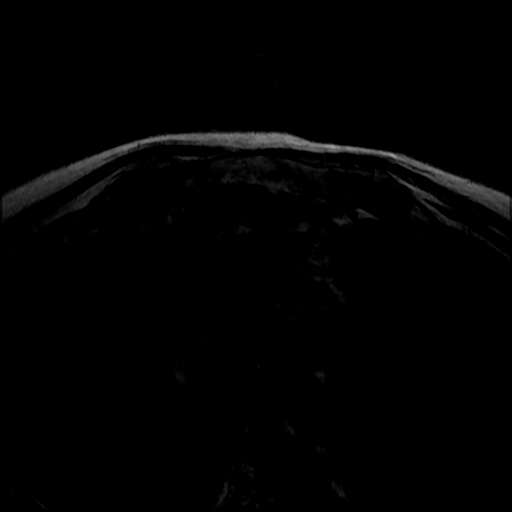

[Series 27: T1 post-contrast · coronal · 5.0mm · 0.43mm/px · 1 of 30 slices shown (1 of 2)]
[im 1/30]
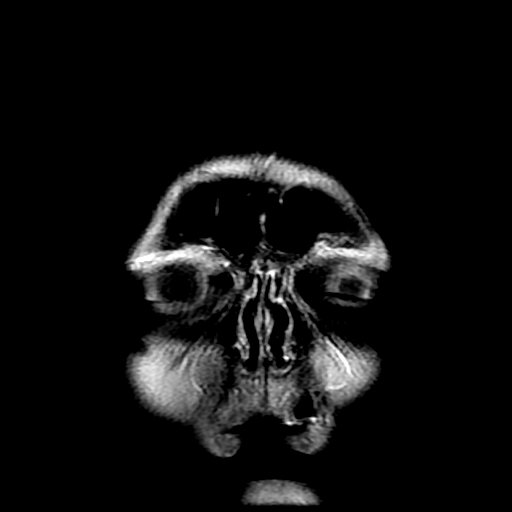

[Series 28: T1 post-contrast · sagittal · 5.0mm · 0.47mm/px · 1 of 27 slices shown (2 of 2)]
[im 1/27]
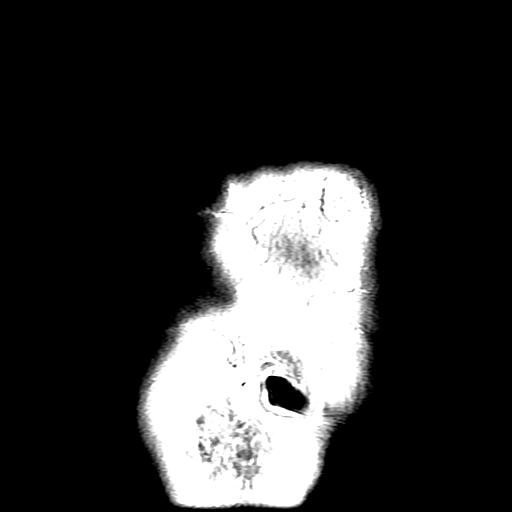

[Series 400: DWI · axial · 3.0mm · 1.09mm/px · z∈[-55,+107]mm · 3 of 55 slices shown (3 of 4)]
[im 1/55]
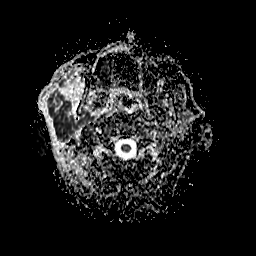
[im 28/55]
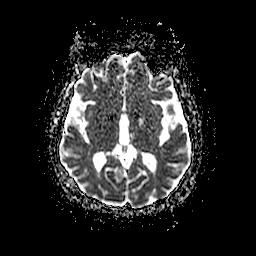
[im 55/55]
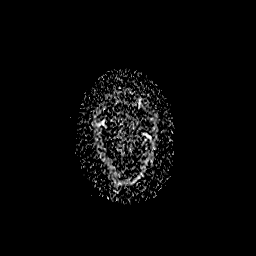

[Series 500: DWI · coronal · 5.0mm · 1.09mm/px · 2 of 35 slices shown (4 of 4)]
[im 1/35]
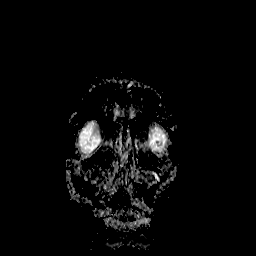
[im 35/35]
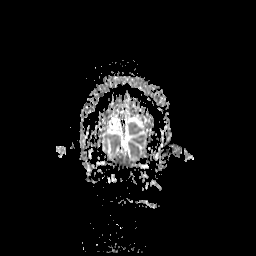

[28 of 48 positions shown; findings below may reference images not displayed]

FINDINGS: MRI HEAD FINDINGS

Round 11 mm focus of reduced diffusion LEFT centrum semiovale.
Subcentimeter focus of reduced diffusion RIGHT parietal cortex,
areas demonstrate low ADC values. No susceptibility artifact to
suggest hemorrhage. Old RIGHT greater than LEFT cerebellar infarcts.
Old RIGHT greater than LEFT pontine infarcts. Old bilateral basal
ganglia and LEFT thalamus lacunar infarcts. Patchy supratentorial
white matter FLAIR T2 hyperintensities compatible with mild chronic
small vessel ischemic disease. LEFT inferior temporal lobe
encephalomalacia. Postcontrast images are moderately motion degraded
without convincing evidence of abnormal intracranial enhancement.

No abnormal extra-axial fluid collections. Loss of RIGHT vertebral
artery flow void. Decreased intensity of distal basilar artery flow
void.

Ocular globes and orbital contents are normal. Scattered small
paranasal sinus mucosal retention cysts without air-fluid levels.
Mild bilateral mastoid effusions. No abnormal sellar expansion. No
cerebellar tonsillar ectopia. No suspicious calvarial bone marrow
signal. Patient is edentulous.

MRI NECK FINDINGS

Moderate to severely motion degraded examination.

Pharynx/larynx: Asymmetric fullness of the RIGHT pharyngeal soft
tissues without discrete mass. Extremely limited assessment of the
larynx due to motion. Nasogastric tube in place.

Major salivary glands: RIGHT parotid gland is enlarged, T2 bright
edema. Severely motion degraded axial postcontrast imaging.
Heterogeneous enhancement noted on the coronal T1 post gadolinium.

Thyroid: Not distinctly identified due to patient motion.

Lymph nodes: Nondiagnostic for assessment of lymphadenopathy due to
motion.

Soft tissues and osseous structures: Edema and enhancement of the
RIGHT paraspinal soft tissues extending to the supraclavicular
fossa. No suspicious osseous enhancement.
IMPRESSION: MRI HEAD: Motion degraded examination. Acute small LEFT centrum
semiovale and RIGHT parietal cortex infarcts.

Old bilateral cerebellar infarcts and old pontine infarcts. Old
bilateral basal ganglia and LEFT thalamus lacunar infarcts.

LEFT inferior temporal lobe encephalomalacia is likely
posttraumatic.

Slow flow versus occluded RIGHT vertebral artery and distal basilar
artery.

MRI NECK:  Moderate to severely motion degraded examination.

Acute RIGHT parotiditis with RIGHT paraspinal and neck edema which
is likely reactive, less likely myositis.

Asymmetric fullness the RIGHT pharyngeal soft tissues.

Patient may better tolerate CT neck with contrast for further
characterization, alternatively repeat examination with sedation.

## 2018-03-13 IMAGING — DX DG CHEST 1V PORT
1 series · 1 of 1 positions shown · non-contrast
Comparison: 01/22/2016

CLINICAL DATA: Pneumonia

EXAM:
PORTABLE CHEST 1 VIEW

[chest ap]
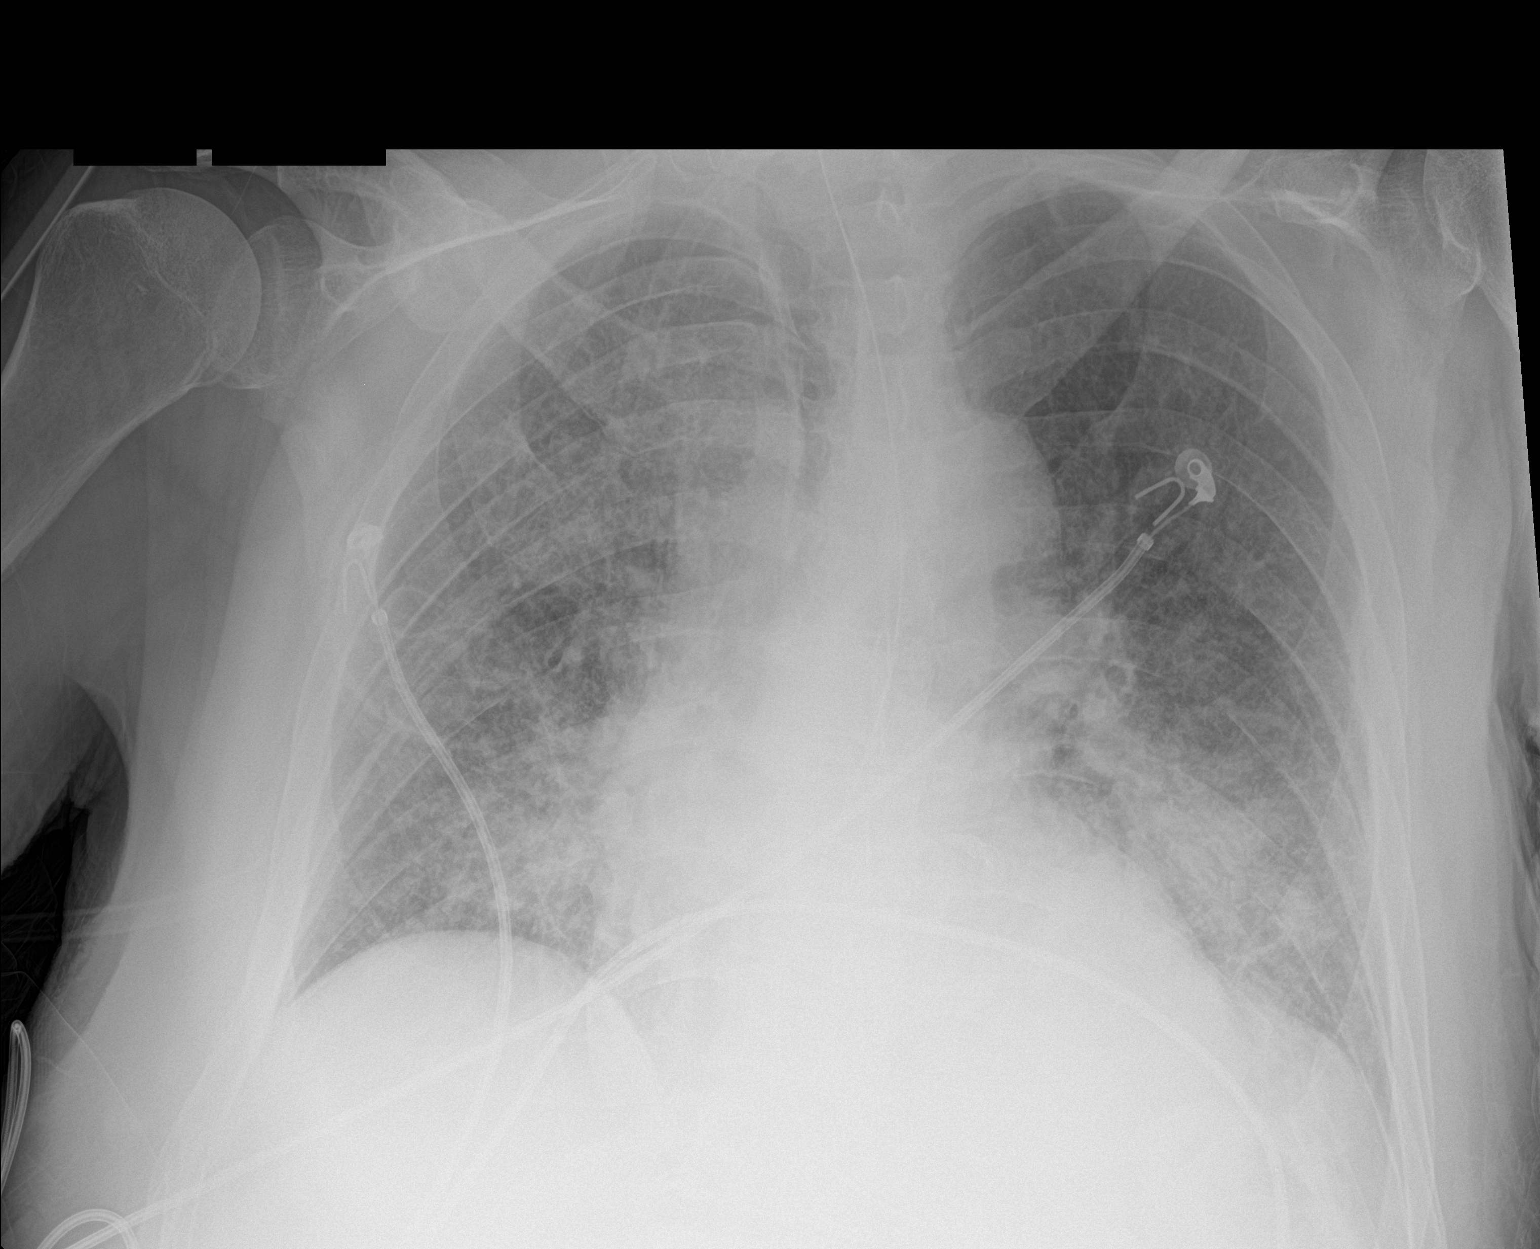

[1 of 1 positions shown; findings below may reference images not displayed]

FINDINGS: Progression of bilateral airspace disease, probably in the bases but
also in the right upper lobe. No significant pleural effusion.

NG tube enters the stomach with the tip not visualized. No central
venous catheter present.
IMPRESSION: Progression of bilateral airspace disease. This is consistent with
the clinical diagnosis of pneumonia. Pulmonary edema could have a
similar appearance.

## 2018-03-14 IMAGING — CT CT NECK W/ CM
3 of 10 series · 11 of 34 positions shown, 13 images · IV contrast (APPLIED)
Comparison: 01/25/2016 MR head and neck. 01/04/2016 head CT and
cervical spine CT.

CLINICAL DATA: 59-year-old male with right facial swelling.
Subsequent encounter.

EXAM:
CT HEAD WITHOUT AND WITH CONTRAST
CT NECK WITH CONTRAST
TECHNIQUE: Contiguous axial images were obtained from the base of the skull
through the vertex without and with intravenous contrast
Multidetector CT imaging of the and neck was performed using the
standard protocol following the bolus administration of intravenous
contrast.
CONTRAST:  75mL 4ABFLZ-M22 IOPAMIDOL ([id number]-M22) INJECTION 61%

[Series 13: neck 2.0 i31s 3 · axial · 0.45mm/px · z∈[-449,-325]mm · 3 of 126 slices shown, 4 images]
[im 32/126  soft-tissue]
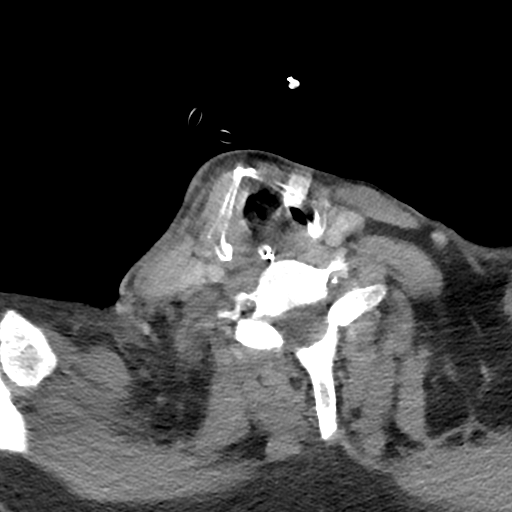
[im 32/126  bone]
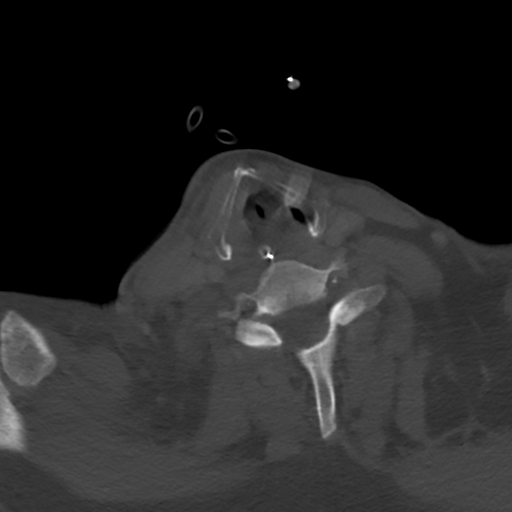
[im 63/126  bone]
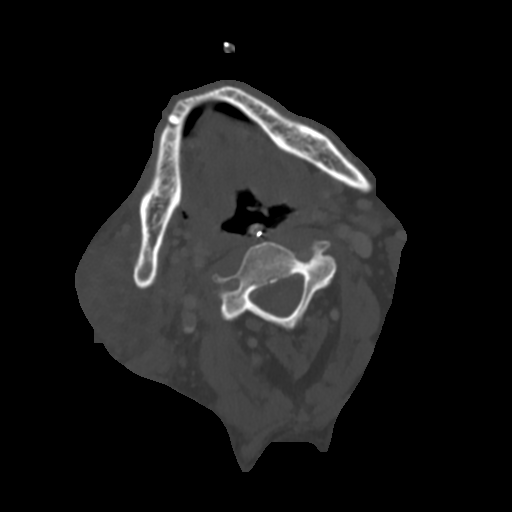
[im 94/126  bone]
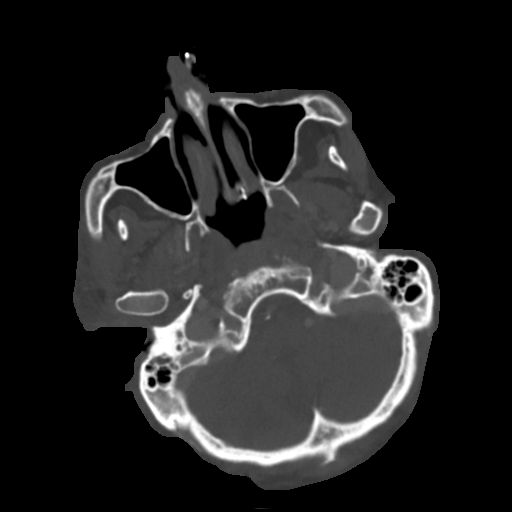

[Series 603: coronal · coronal · 0.49mm/px · 3 of 71 slices shown]
[im 15/71  bone]
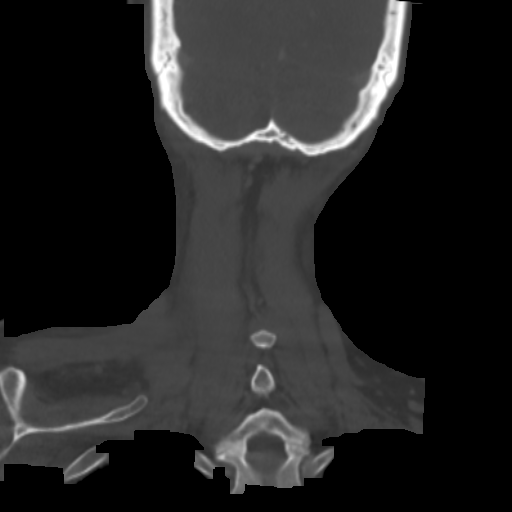
[im 29/71  bone]
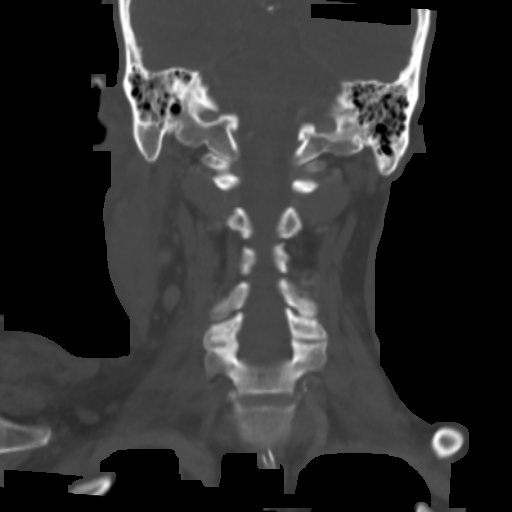
[im 43/71  bone]
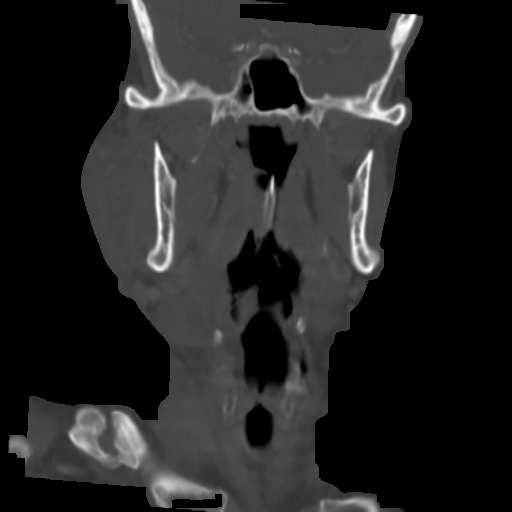

[Series 604: sagittal · sagittal · 0.49mm/px · 5 of 56 slices shown, 6 images]
[im 19/56  bone]
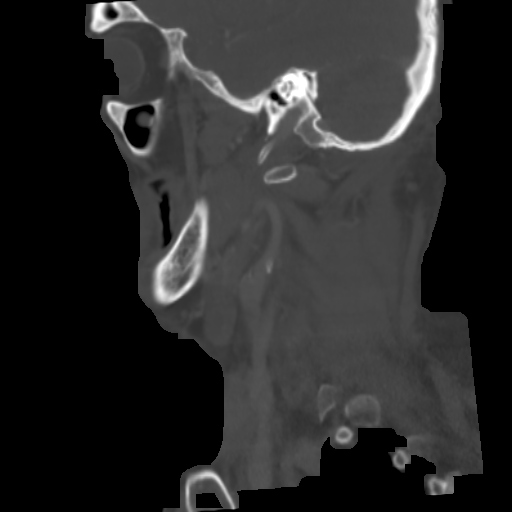
[im 23/56  bone]
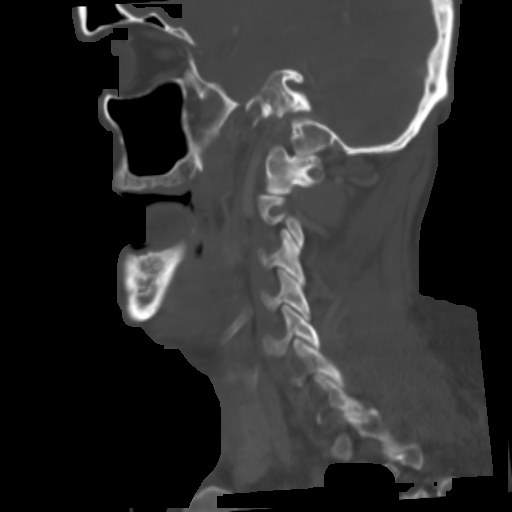
[im 28/56  soft-tissue]
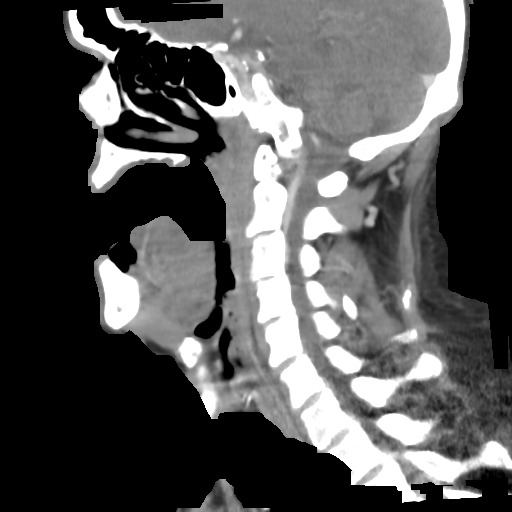
[im 28/56  bone]
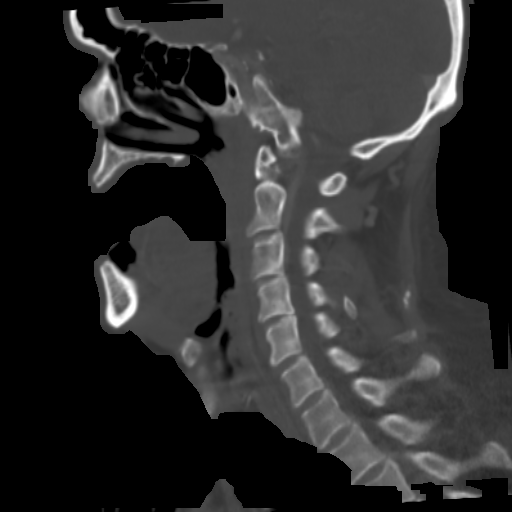
[im 33/56  bone]
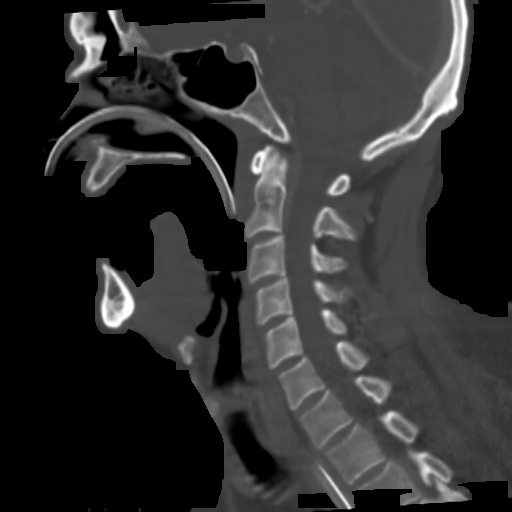
[im 37/56  bone]
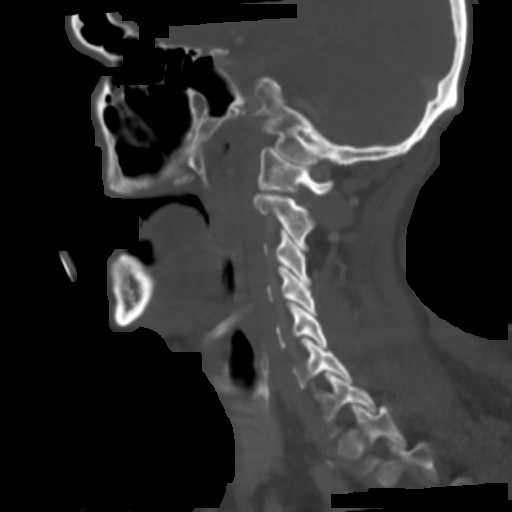

[11 of 34 positions shown; findings below may reference images not displayed]

FINDINGS: CT HEAD FINDINGS

No intracranial hemorrhage.

Remote infarcts right pons, right cerebellum, basal ganglia
bilaterally and left thalamus.

CT detected regions of restricted motion within the left frontal
lobe and right parietal lobe not well delineated on present exam.
Given the patient's surrounding infarcts, these areas of restricted
motion may represent acute infarcts however, with the below
described neck infection, small abscesses could also cause a similar
appearance. This can be addressed by close follow-up MR imaging if
the patient has progressive mental status changes.

Global atrophy without hydrocephalus.

No intracranial enhancing lesion.

Prominent atherosclerotic changes right vertebral artery with
significant narrowing. Occluded mid aspect of the basilar artery.
Internal carotid artery narrowing cavernous sinus level.

Partial opacification right mastoid air cells.

CT NECK FINDINGS

Diffuse inflammation of the right parotid gland with inflammatory
process extending into the right masseter muscle and medial
pterygoid muscle. Inflammatory process borders the crossing
digastric muscle. Inflammation of overlying platysmas muscle and
impresses upon the anterior border of the sternocleidomastoid
muscle. Inflammation extends into the right parapharyngeal space/
carotid space, right temporal fossa as well subcutaneous region. No
stone seen within the region of the parotid duct. No mass detected
within the inflamed parotid gland.

Bilateral adenopathy greater on the right probably reactive in
origin.

Feeding tube is in place.

Bilateral lung parenchymal changes some of which may represent
scarring with superimposed infection/ edema considerations.
Consolidation posterior aspect left upper lobe. Clearing of lung
parenchymal changes on follow-up imaging recommended to exclude
underlying mass.

Carotid bifurcation calcifications. Evaluating the degree of
stenosis limited by motion.

Polypoid opacification maxillary sinuses.

No prevertebral/ retropharyngeal abnormality.

No osseous destructive lesion.
IMPRESSION: CT HEAD

Remote infarcts right pons, right cerebellum, basal ganglia
bilaterally and left thalamus.

CT detected regions of restricted motion within the left frontal
lobe and right parietal lobe not well delineated on present exam.
Given the patient's surrounding infarcts, these areas of restricted
motion may represent acute infarcts however, with the below
described neck infection, small abscesses could also cause a similar
appearance. This can be addressed by close follow-up MR imaging if
the patient has progressive mental status changes.

Global atrophy without hydrocephalus.

No intracranial enhancing lesion.

Prominent atherosclerotic changes right vertebral artery with
significant narrowing. Occluded mid aspect of the basilar artery.
Internal carotid artery narrowing cavernous sinus level.

Partial opacification right mastoid air cells.

CT NECK

Diffuse inflammation of the right parotid gland with extension of
inflammatory process as described above. No deep drainable abscess.
No calcified parotid duct obstructing stone noted.

Bilateral adenopathy greater on the right probably reactive in
origin.

Bilateral lung parenchymal changes some of which may represent
scarring with superimposed infection/ edema considerations.
Consolidation posterior aspect left upper lobe. Clearing of lung
parenchymal changes on follow-up imaging recommended to exclude
underlying mass.

## 2018-03-15 IMAGING — CR DG CHEST 1V PORT
1 series · 1 of 1 positions shown · non-contrast
Comparison: 01/25/2016

CLINICAL DATA: PICC line placement

EXAM:
PORTABLE CHEST 1 VIEW

[AP]
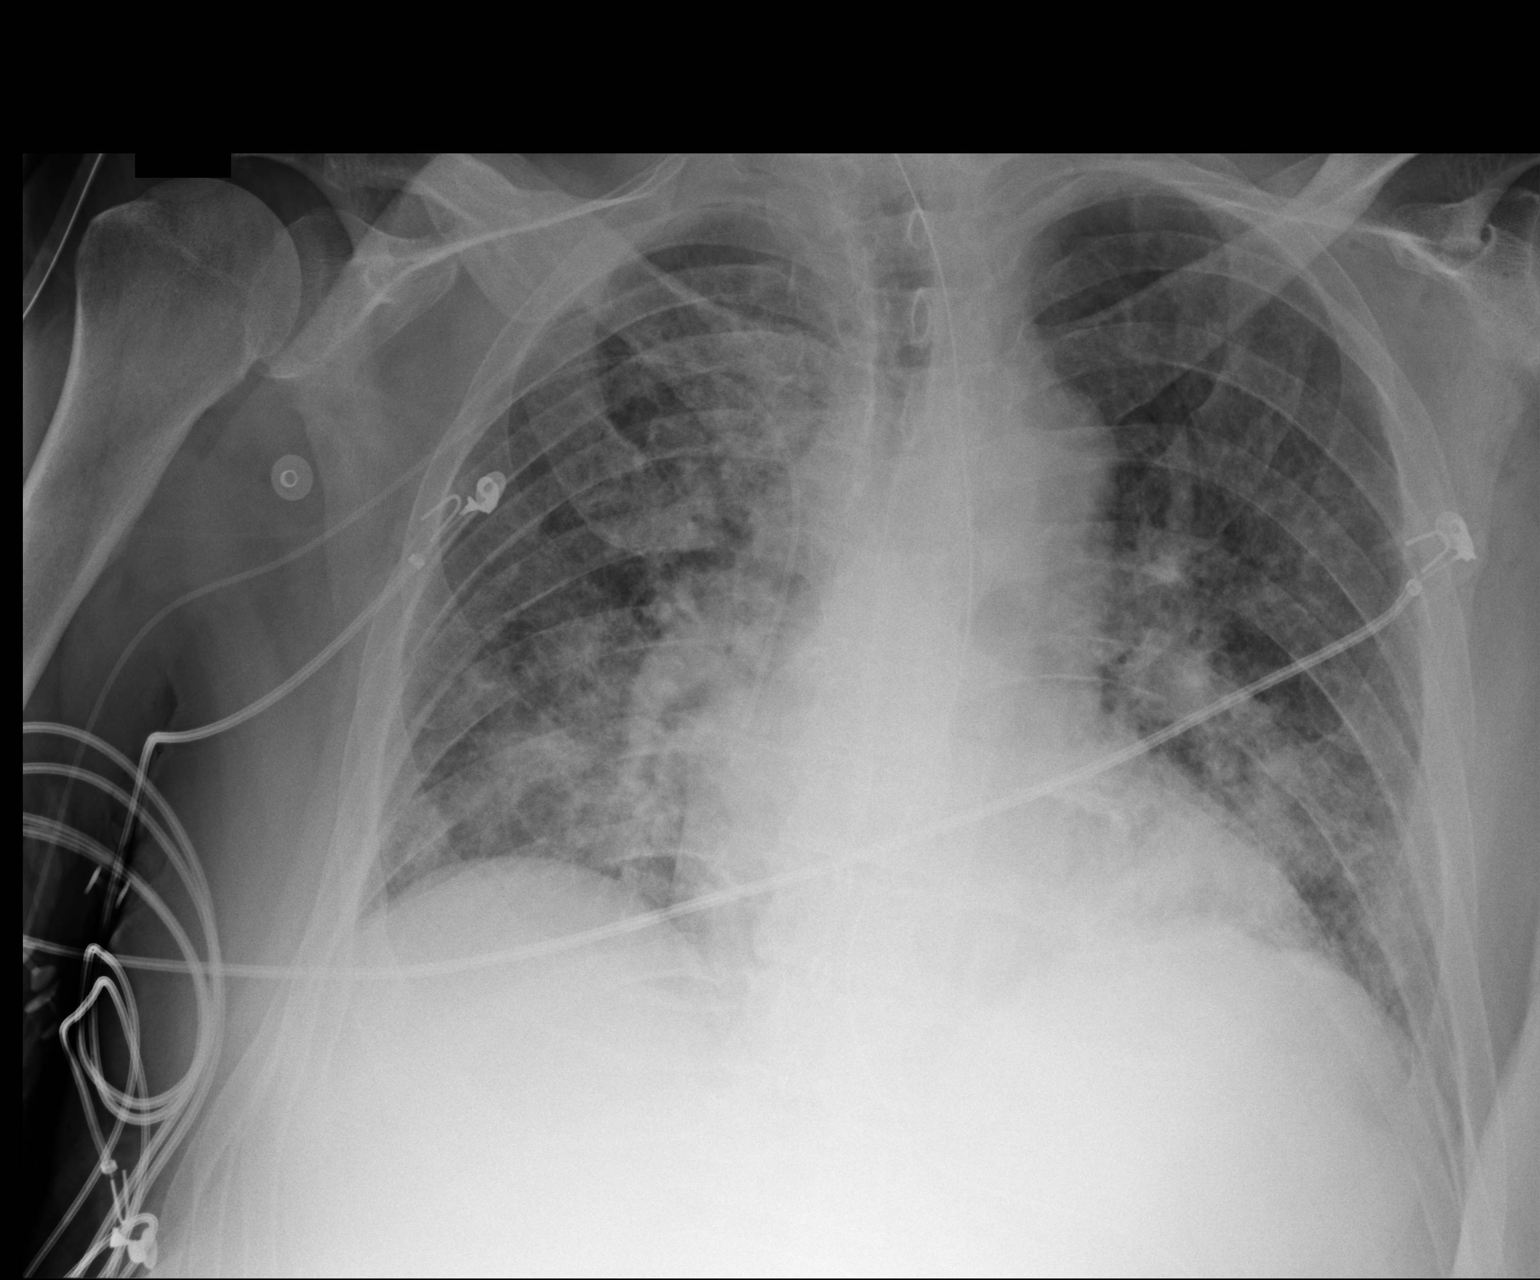

[1 of 1 positions shown; findings below may reference images not displayed]

FINDINGS: Right PICC line has been placed with tip 2 cm beyond the cavoatrial
junction. Stable NG tube. Stable bilateral mid to lower lung zone
opacities. No pneumothorax.
IMPRESSION: PICC line as described

## 2018-03-16 IMAGING — DX DG CHEST 1V PORT
1 series · 1 of 1 positions shown · non-contrast
Comparison: 01/27/2016.

CLINICAL DATA: Respiratory failure.  Pneumonia.

EXAM:
PORTABLE CHEST 1 VIEW

[chest ap]
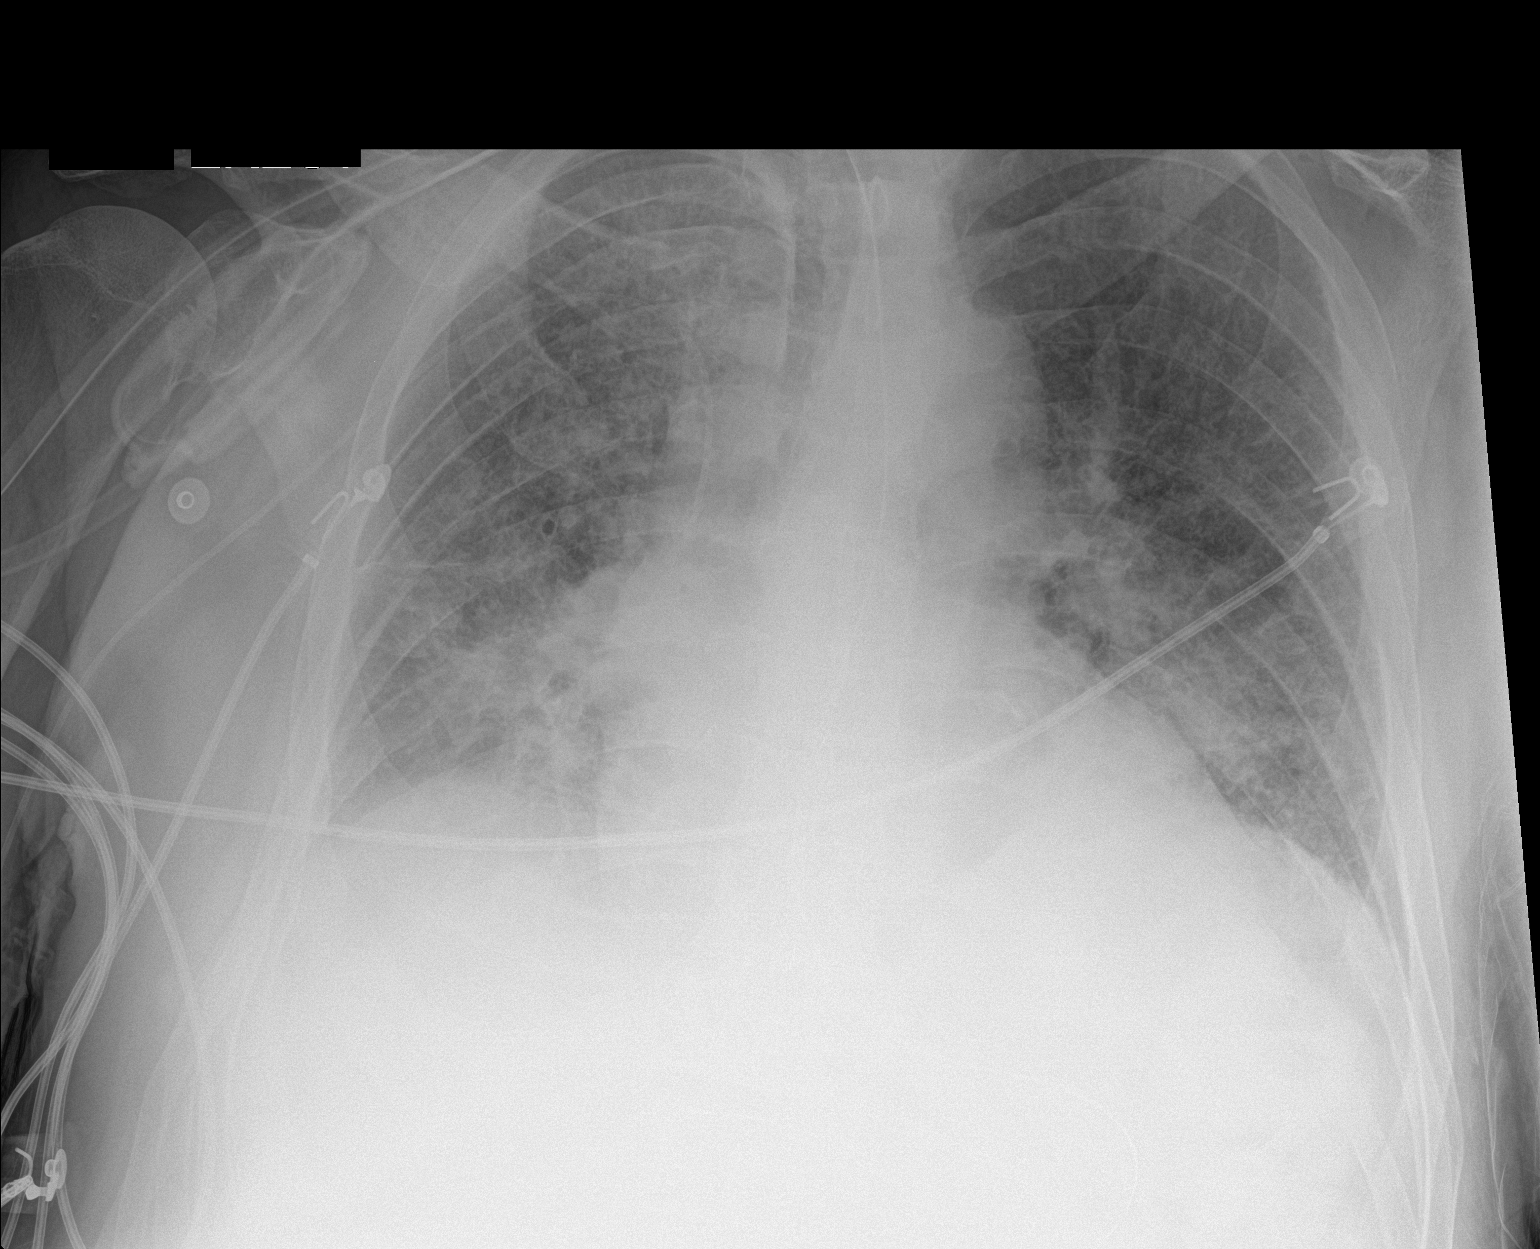

[1 of 1 positions shown; findings below may reference images not displayed]

FINDINGS: NG tube noted with tip below left hemidiaphragm. Right PICC line
stable position. Stable cardiomegaly. Persistent diffuse bilateral
pulmonary interstitial prominence consistent with interstitial edema
and/or pneumonitis. Persistent low lung volumes with basilar
atelectasis and/or infiltrates. Mild improvement from prior exam. No
pleural effusion or pneumothorax.
IMPRESSION: 1. Lines and tubes in stable position .
2. Persistent bilateral pulmonary interstitial prominence consistent
with interstitial edema and/or pneumonitis. Persistent bibasilar
atelectasis and/or infiltrates. Mild improvement from prior exam.
3. Stable cardiomegaly.

## 2018-03-19 IMAGING — CR DG CHEST 1V PORT
1 series · 1 of 1 positions shown · non-contrast
Comparison: 01/28/2016 and earlier.

CLINICAL DATA: Followup pneumonia.

EXAM:
PORTABLE CHEST 1 VIEW

[AP]
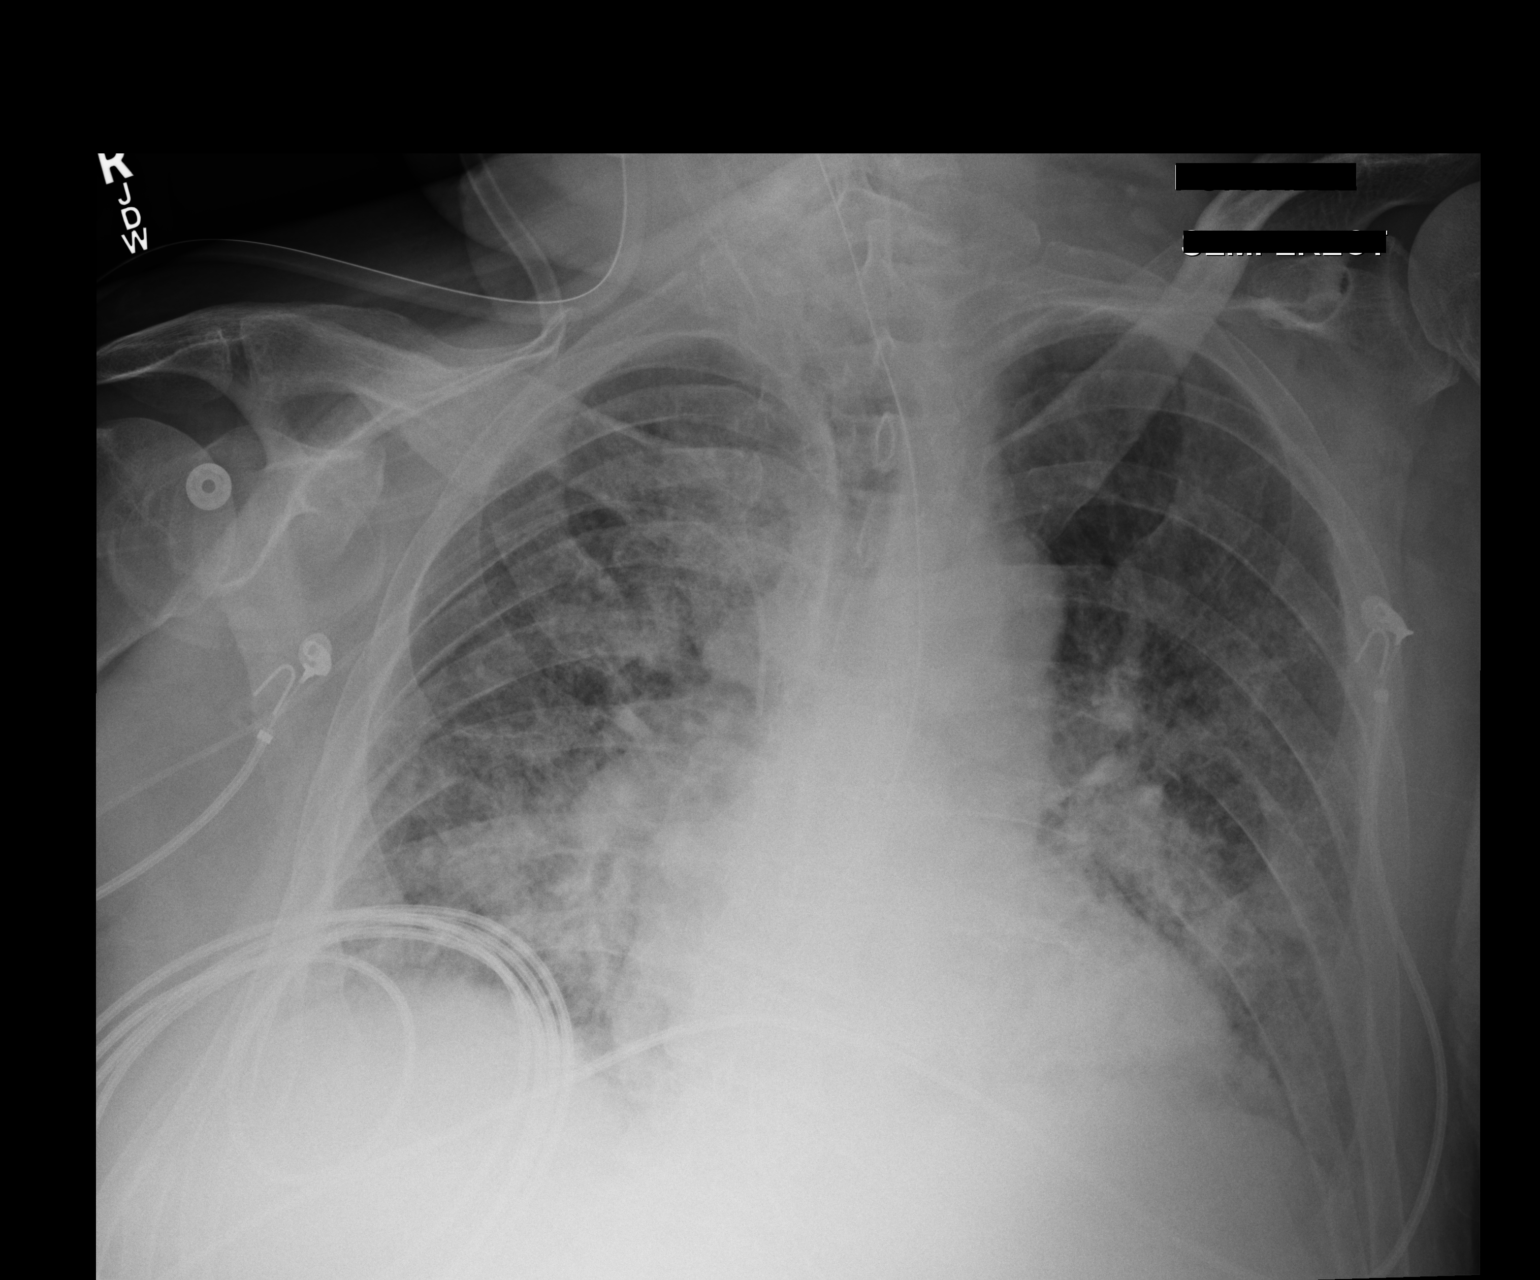

[1 of 1 positions shown; findings below may reference images not displayed]

FINDINGS: Nasogastric tube courses below the diaphragm into the stomach. Right
arm PICC tip projects over the upper SVC, unchanged. Airspace
opacities throughout both lungs, most confluent in the lung bases,
slightly worse than on the examination 3 days ago. No new focal
areas of airspace consolidation. Small bilateral pleural effusions,
unchanged.
IMPRESSION: 1.  Support apparatus satisfactory.
2. Since the examination 3 days ago, worsening of pneumonia
throughout both lungs, most confluent in the lung bases.
3. Stable small bilateral pleural effusions.

## 2018-03-21 IMAGING — CT CT ABDOMEN W/O CM
2 of 4 series · 11 of 46 positions shown, 12 images · non-contrast
Comparison: None.

CLINICAL DATA: Evaluate anatomy for possible it gastrostomy tube

EXAM:
CT ABDOMEN WITHOUT CONTRAST
TECHNIQUE: Multidetector CT imaging of the abdomen was performed following the
standard protocol without IV contrast.

[Series 201: routine, idose (2) · axial · 0.92mm/px · z∈[+212,+497]mm · 8 of 71 slices shown, 9 images]
[im 7/71  soft-tissue]
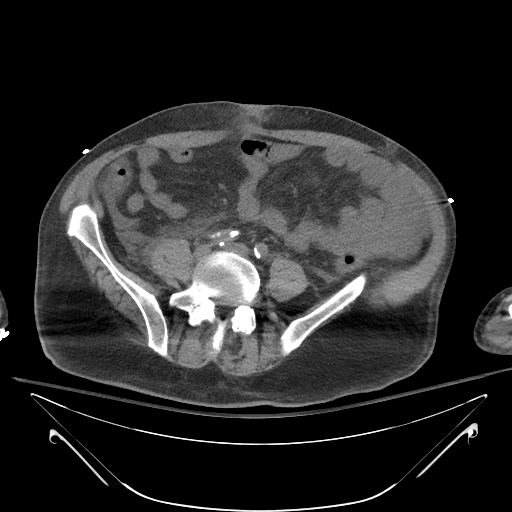
[im 7/71  bone]
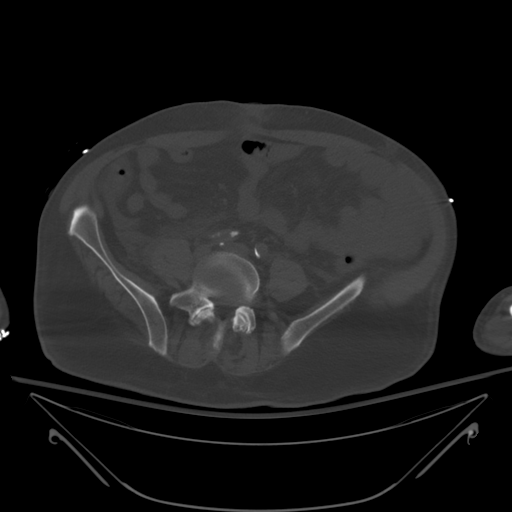
[im 14/71  soft-tissue]
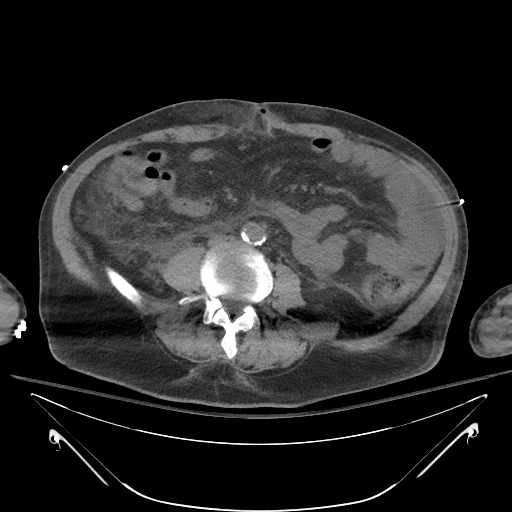
[im 24/71  soft-tissue]
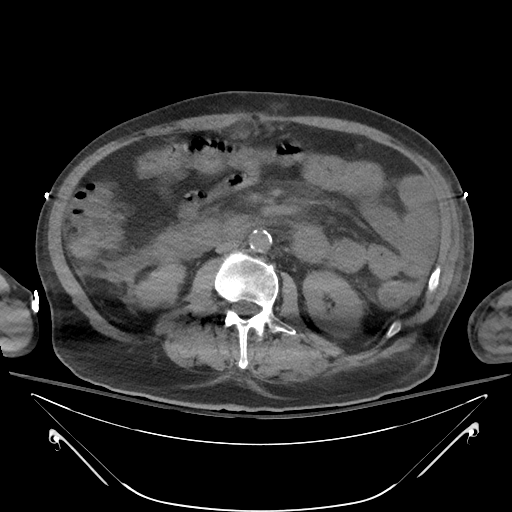
[im 31/71  soft-tissue]
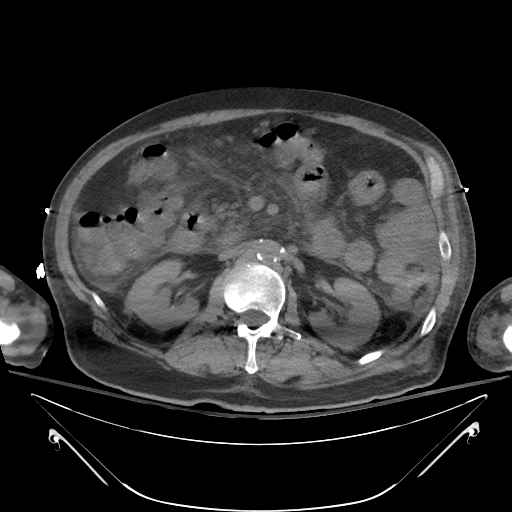
[im 41/71  soft-tissue]
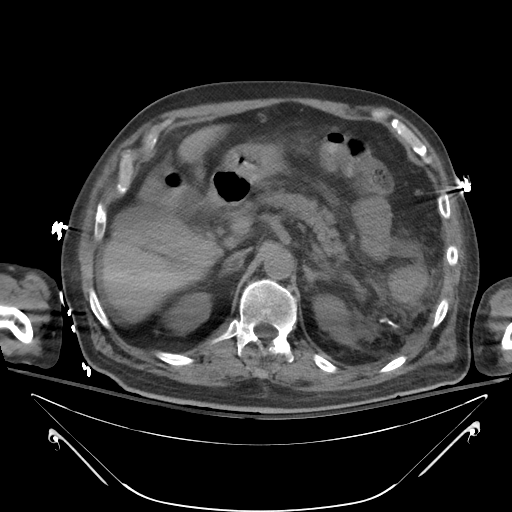
[im 47/71  soft-tissue]
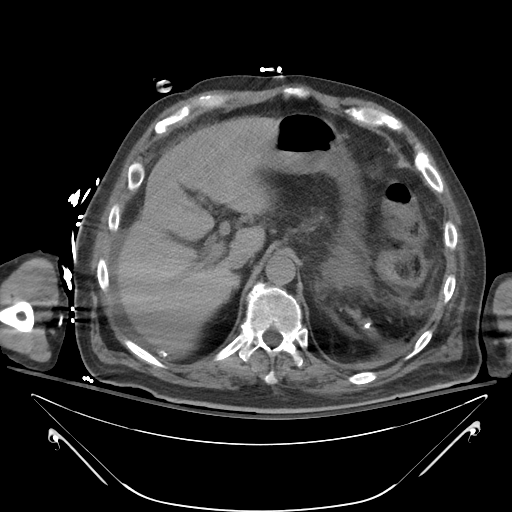
[im 57/71  soft-tissue]
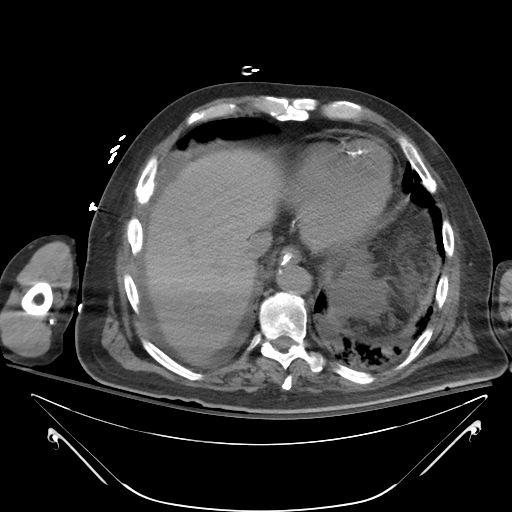
[im 64/71  soft-tissue]
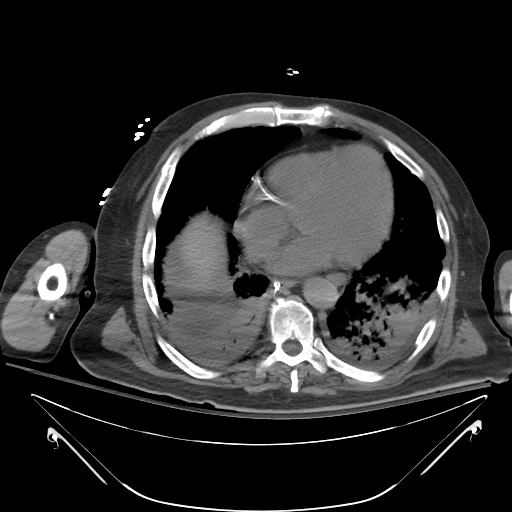

[Series 203: coronals, idose (2) · coronal · 0.45mm/px · 3 of 171 slices shown]
[im 57/171  soft-tissue]
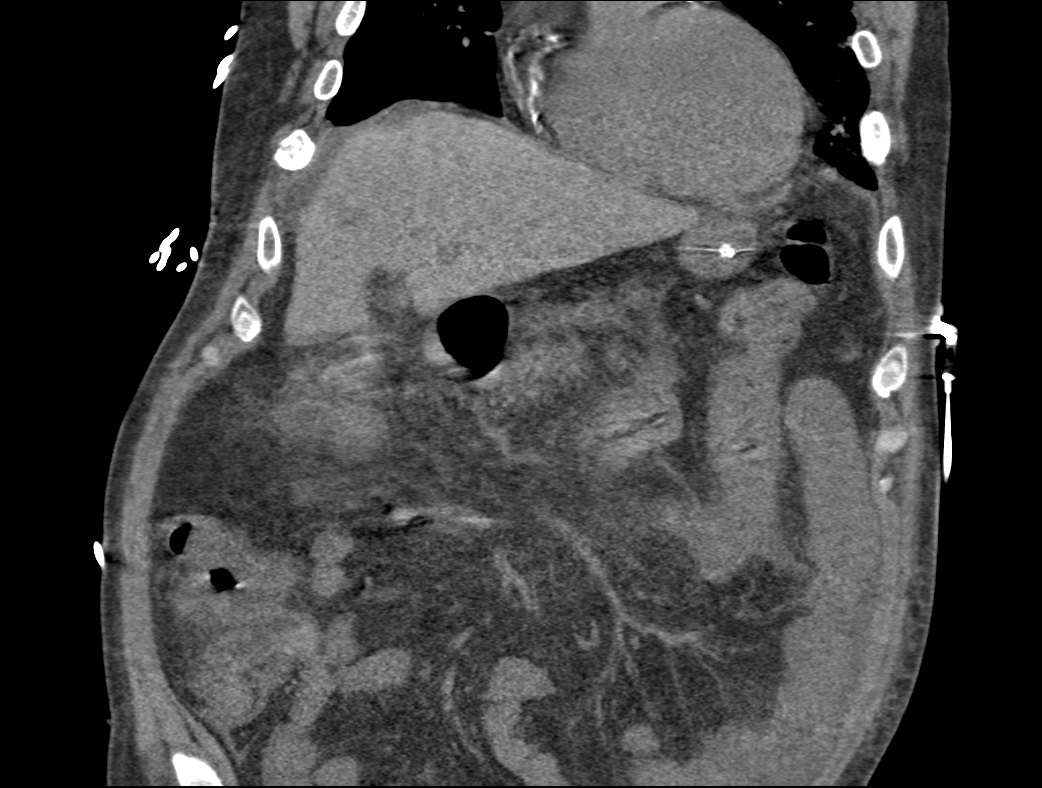
[im 76/171  soft-tissue]
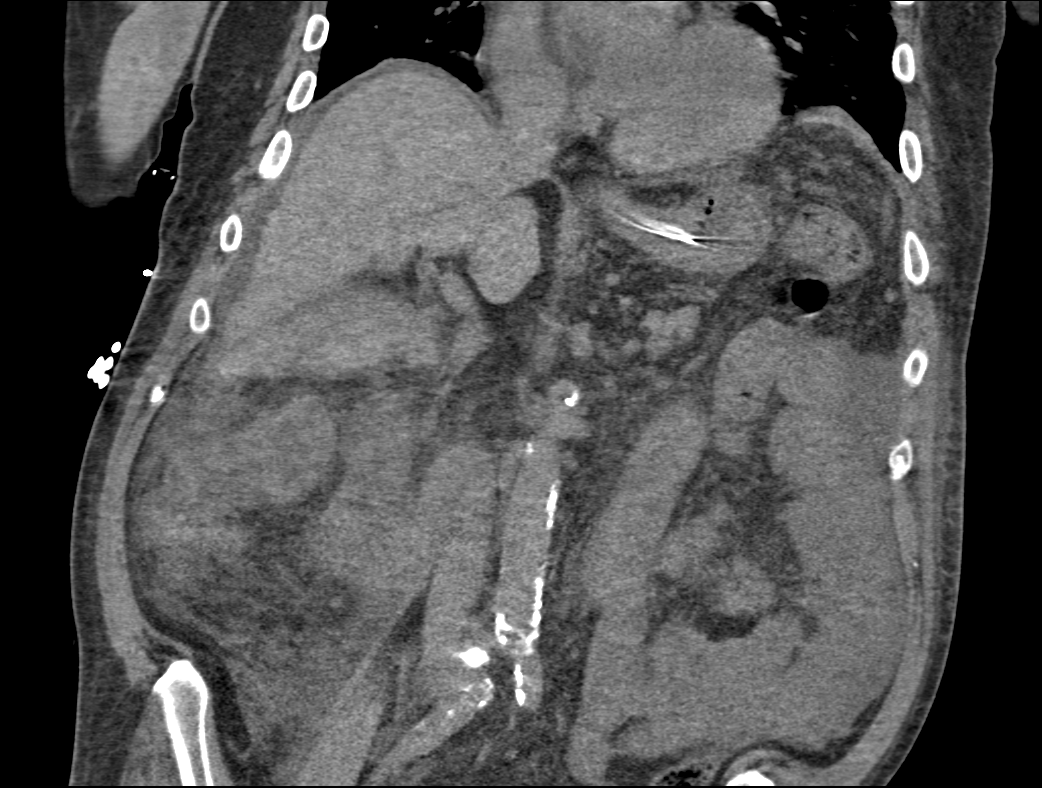
[im 95/171  soft-tissue]
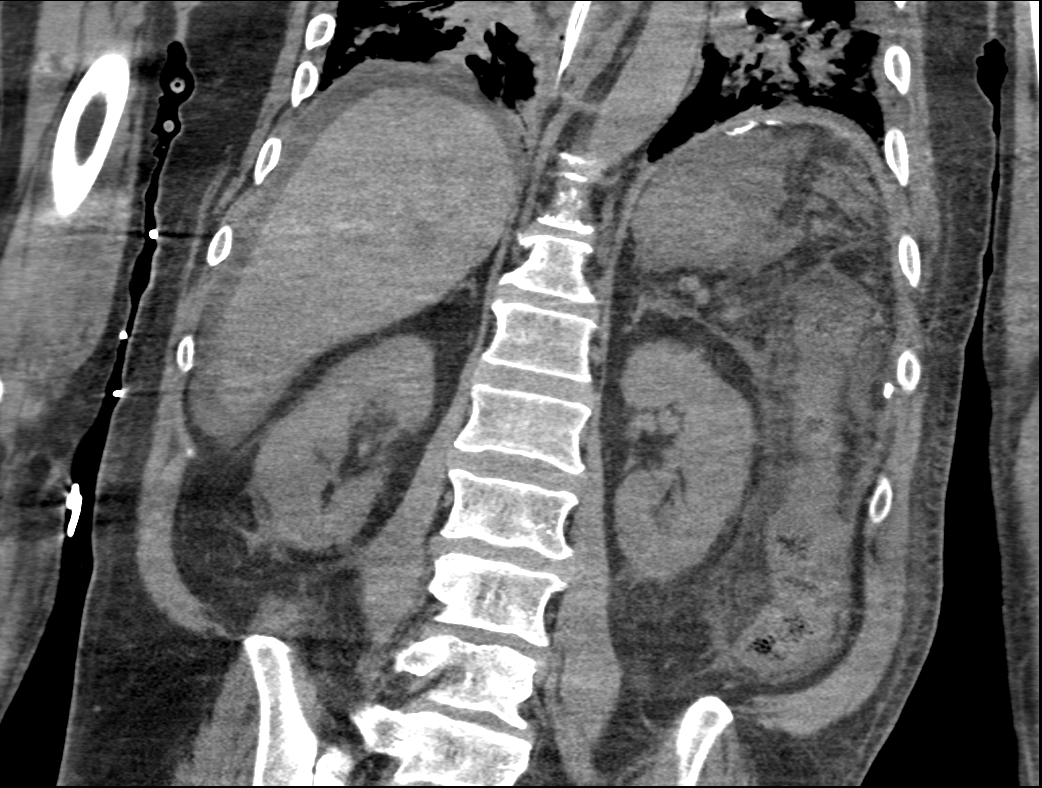

[11 of 46 positions shown; findings below may reference images not displayed]

FINDINGS: Dense consolidation noted in both lower lobes concerning for
pneumonia. Small bilateral pleural effusions. Mild cardiomegaly.
Densely calcified coronary arteries.

Enteric tube tip is in the mid stomach. Liver, gallbladder,
pancreas, adrenals and kidneys have an unremarkable unenhanced
appearance. Presumed prior splenectomy. Suture line noted in the
left upper quadrant with adjacent small fluid collection measuring
3.1 x 2.6 cm on image 21. Other small of the ********* head other
scattered fluid collections in the left upper quadrant.

Visualized large and small bowel decompressed, grossly unremarkable.
Stomach decompressed and also grossly unremarkable. Small amount of
free fluid adjacent to the liver. Aorta and iliac vessels are
calcified, non aneurysmal.

No acute bony abnormality or focal bone lesion.
IMPRESSION: Dense consolidation in the lower lobes bilaterally with small
effusions. Findings concerning for pneumonia.

Presumed prior splenectomy. Small fluid collections in the left
upper quadrant. Free fluid noted around the liver.

Enteric tube in the mid stomach.

## 2018-03-27 IMAGING — XA IR PERC PLACEMENT GASTROSTOMY
7 series · 8 of 8 positions shown · non-contrast
Comparison: none

INDICATION: Dysphagia, poor nutritional status

[Series 2: fl (-) angio · 2 of 2 slices shown (1 of 7)]
[im 1/2]
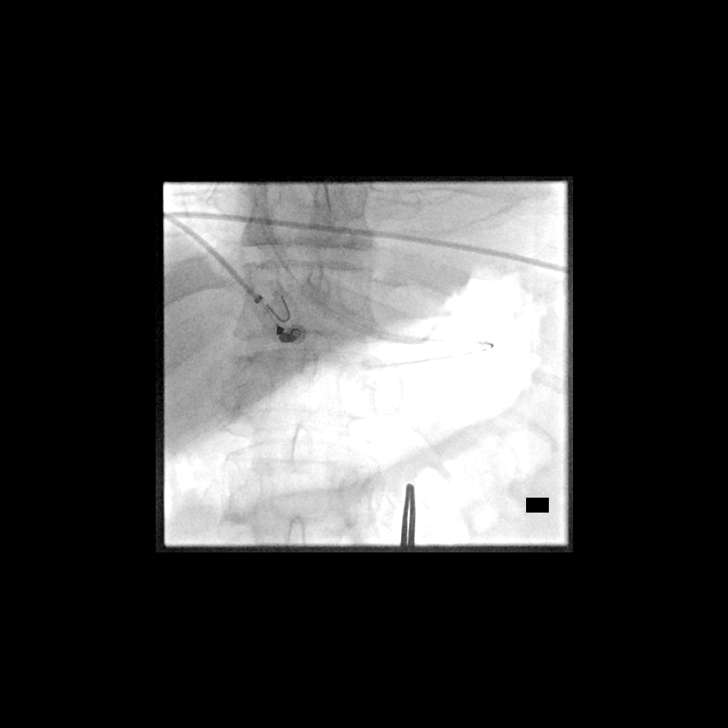
[im 2/2]
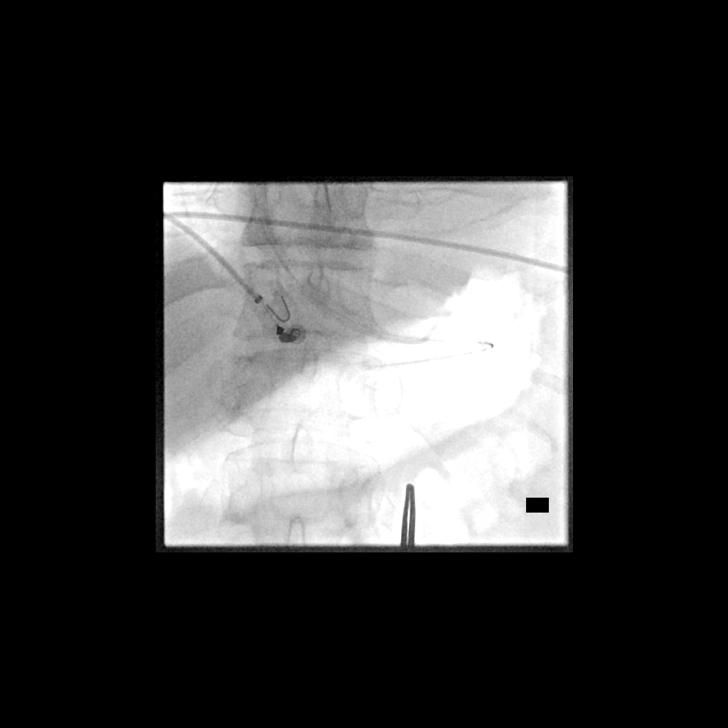

[Series 3: fl (-) angio · 1 of 1 slices shown (2 of 7)]
[im 1/1]
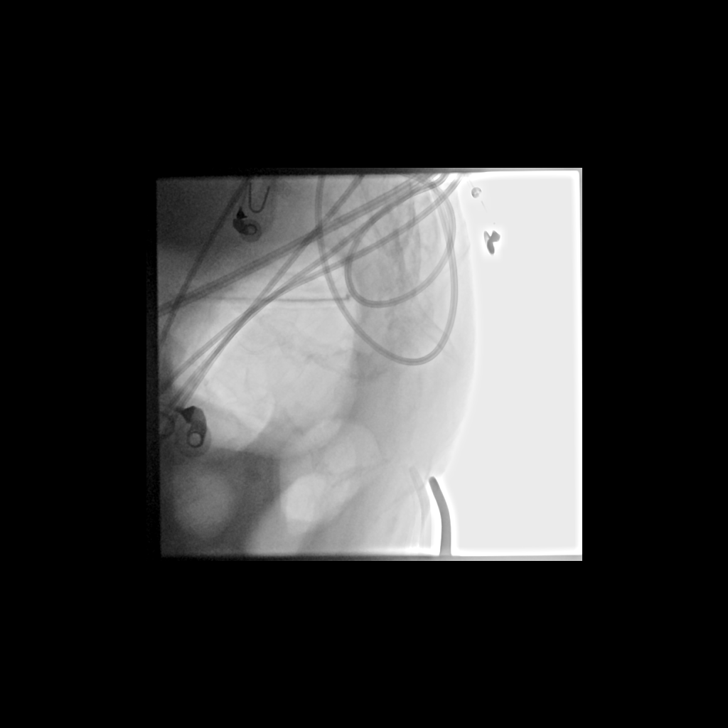

[Series 4: fl (-) angio · 1 of 1 slices shown (3 of 7)]
[im 1/1]
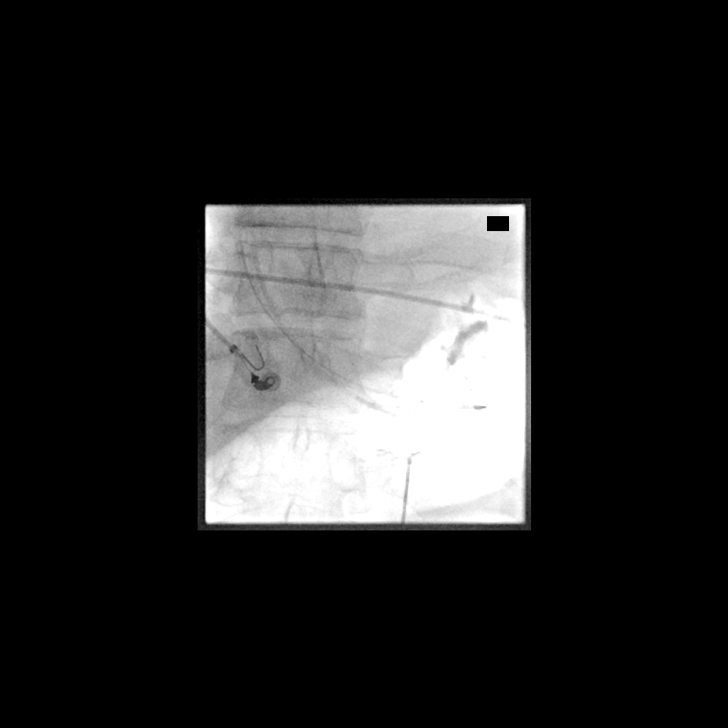

[Series 5: fl (-) angio · 1 of 1 slices shown (4 of 7)]
[im 1/1]
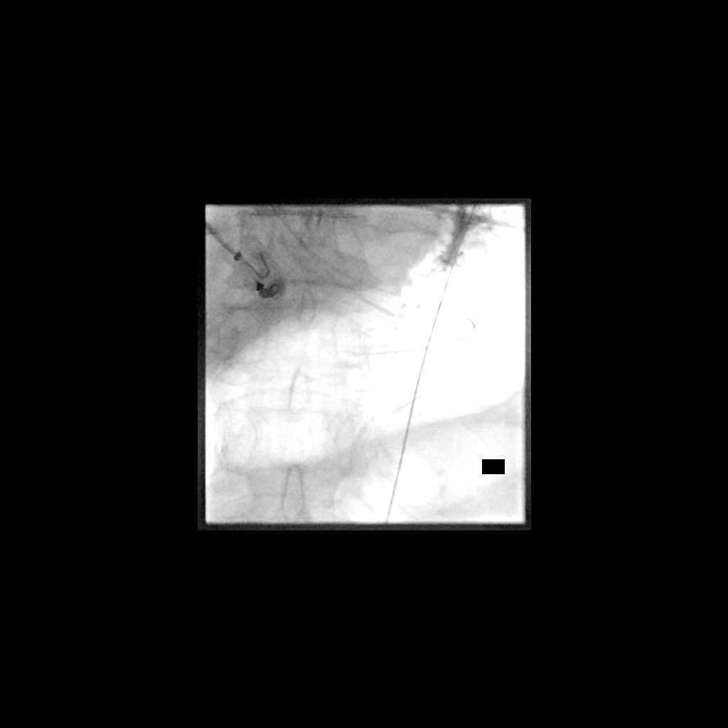

[Series 6: fl (-) angio · 1 of 1 slices shown (5 of 7)]
[im 1/1]
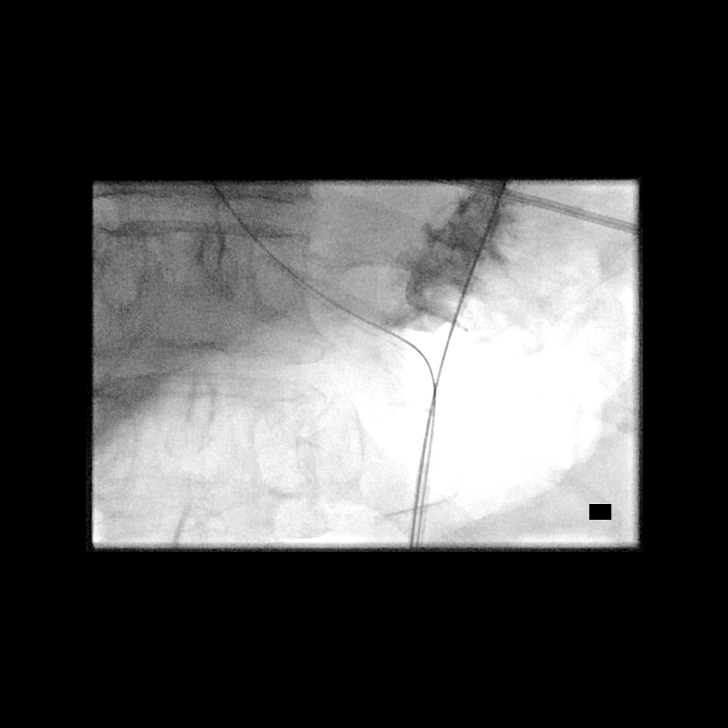

[Series 7: fl (-) angio · 1 of 1 slices shown (6 of 7)]
[im 1/1]
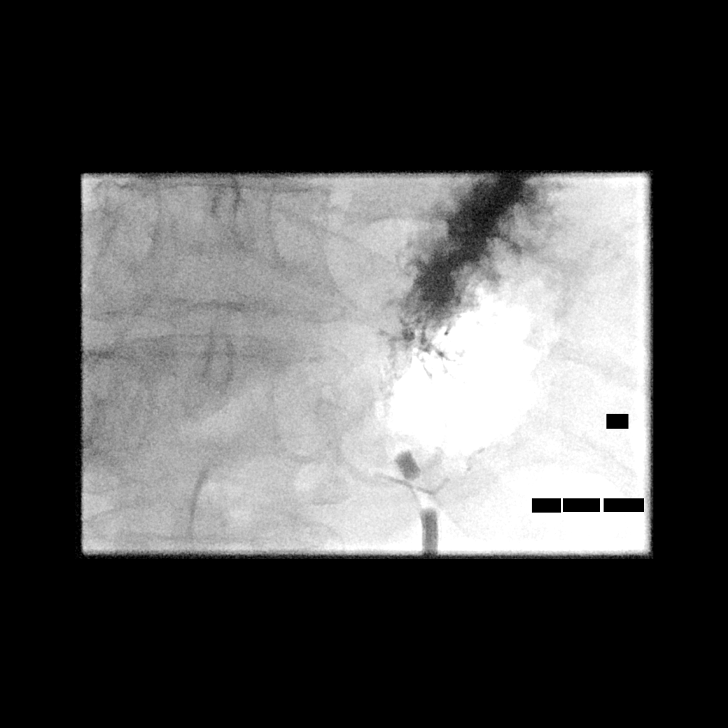

[Series 8: fl (-) angio · 1 of 1 slices shown (7 of 7)]
[im 1/1]
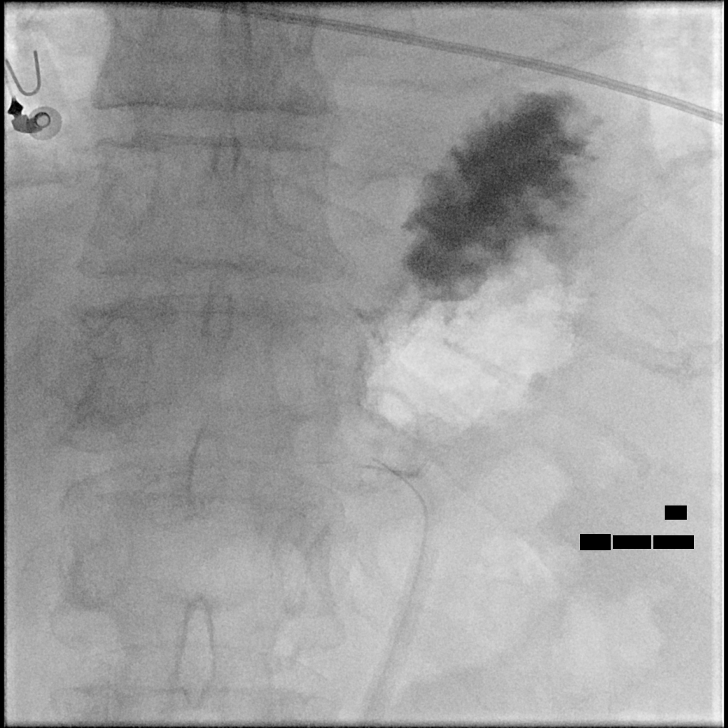

[8 of 8 positions shown; findings below may reference images not displayed]

EXAM:
FLUOROSCOPIC 20 FRENCH PULL-THROUGH GASTROSTOMY

Radiologist:  Sba, Jarly

Guidance:  Fluoroscopic

MEDICATIONS:
Patient is already receiving vancomycin daily  0.5 mg glucagon IV

ANESTHESIA/SEDATION:
Moderate Sedation Time:  None.

The patient was continuously monitored during the procedure by the
interventional radiology nurse under my direct supervision.

CONTRAST:  10 cc Omnipaque 300 - administered into the gastric
lumen.

FLUOROSCOPY TIME:  Fluoroscopy Time: 8 minutes 30 seconds (111.7
mGy).

COMPLICATIONS:
None immediate.

PROCEDURE:
Informed consent was obtained from the patient following explanation
of the procedure, risks, benefits and alternatives. The patient
understands, agrees and consents for the procedure. All questions
were addressed. A time out was performed.

Maximal barrier sterile technique utilized including caps, mask,
sterile gowns, sterile gloves, large sterile drape, hand hygiene,
and betadine prep.

The left upper quadrant was sterilely prepped and draped. An oral
gastric catheter was inserted into the stomach under fluoroscopy.
The existing nasogastric feeding tube was removed. Air was injected
into the stomach for insufflation and visualization under
fluoroscopy. The air distended stomach was confirmed beneath the
anterior abdominal wall in the frontal and lateral projections.
Under sterile conditions and local anesthesia, a 17 gauge trocar
needle was utilized to access the stomach percutaneously beneath the
left subcostal margin. Needle position was confirmed within the
stomach under biplane fluoroscopy. Contrast injection confirmed
position also. A single T tack was deployed for gastropexy. Over an
Amplatz guide wire, a 9-French sheath was inserted into the stomach.
A snare device was utilized to capture the oral gastric catheter.
The snare device was pulled retrograde from the stomach up the
esophagus and out the oropharynx. The 20-French pull-through
gastrostomy was connected to the snare device and pulled antegrade
through the oropharynx down the esophagus into the stomach and then
through the percutaneous tract external to the patient. The
gastrostomy was assembled externally. Contrast injection confirms
position in the stomach. Images were obtained for documentation. The
patient tolerated procedure well. No immediate complication.
IMPRESSION: Fluoroscopic insertion of a 20-French "pull-through" gastrostomy.

## 2018-04-10 IMAGING — DX DG CHEST 1V PORT
1 series · 1 of 1 positions shown · non-contrast
Comparison: 01/31/2016

CLINICAL DATA: Respiratory failure

EXAM:
PORTABLE CHEST 1 VIEW

[chest ap]
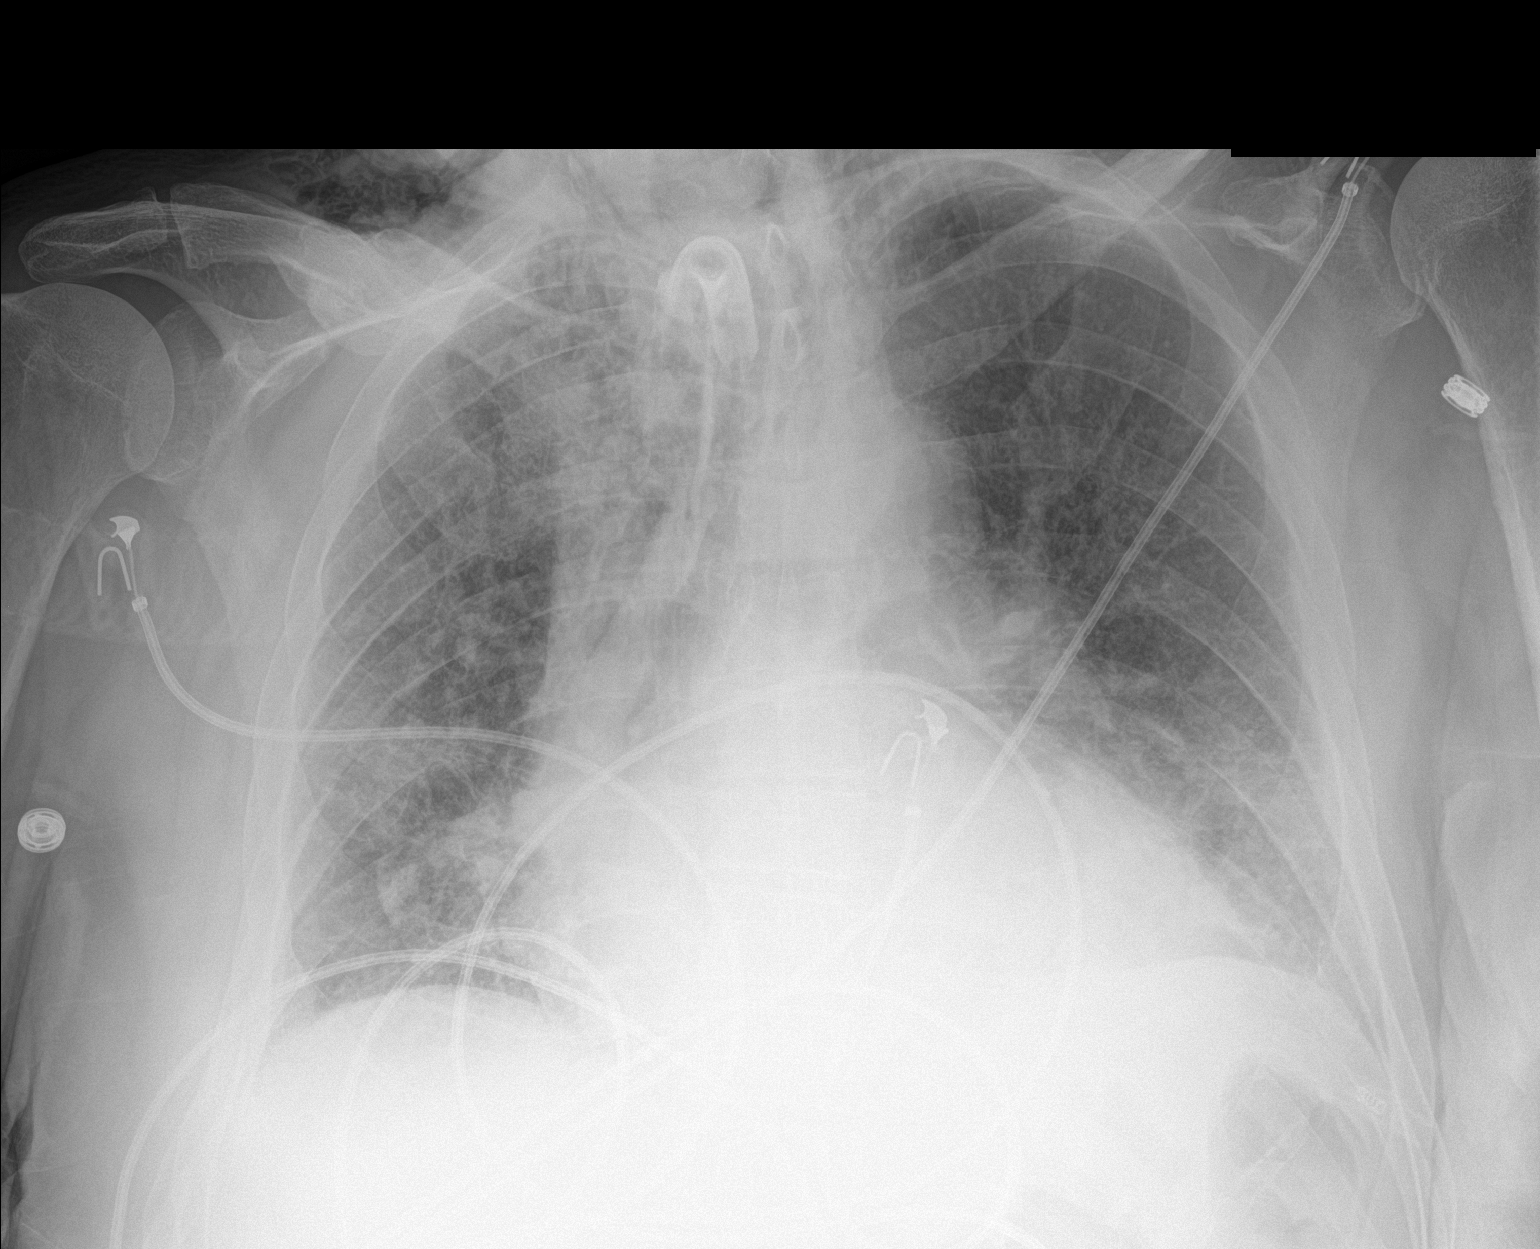

[1 of 1 positions shown; findings below may reference images not displayed]

FINDINGS: Tracheostomy tube is now seen. The nasogastric catheter is been
removed. There is a considerable amount of subcutaneous emphysema in
the neck and extending down into the mediastinum. This may be
related to the recent tracheostomy placement no definitive
pneumothorax is noted at this time. The cardiac shadow is stable.
The lungs are well aerated. Old right clavicular fracture is seen.
IMPRESSION: Tracheostomy tube in satisfactory position. There are changes
consistent with subcutaneous emphysema and mild pneumomediastinum
consistent with the recent procedure. No true pneumothorax is seen.

## 2018-04-12 IMAGING — DX DG CHEST 1V PORT
1 series · 1 of 1 positions shown · non-contrast
Comparison: 02/25/2016 and earlier.

CLINICAL DATA: Chronic ventilator dependent respiratory failure.
Shortness of breath and chest congestion. Evaluate tracheostomy tube
position. Followup pneumomediastinum and basilar atelectasis versus
pneumonia.

EXAM:
PORTABLE CHEST 1 VIEW

[chest ap]
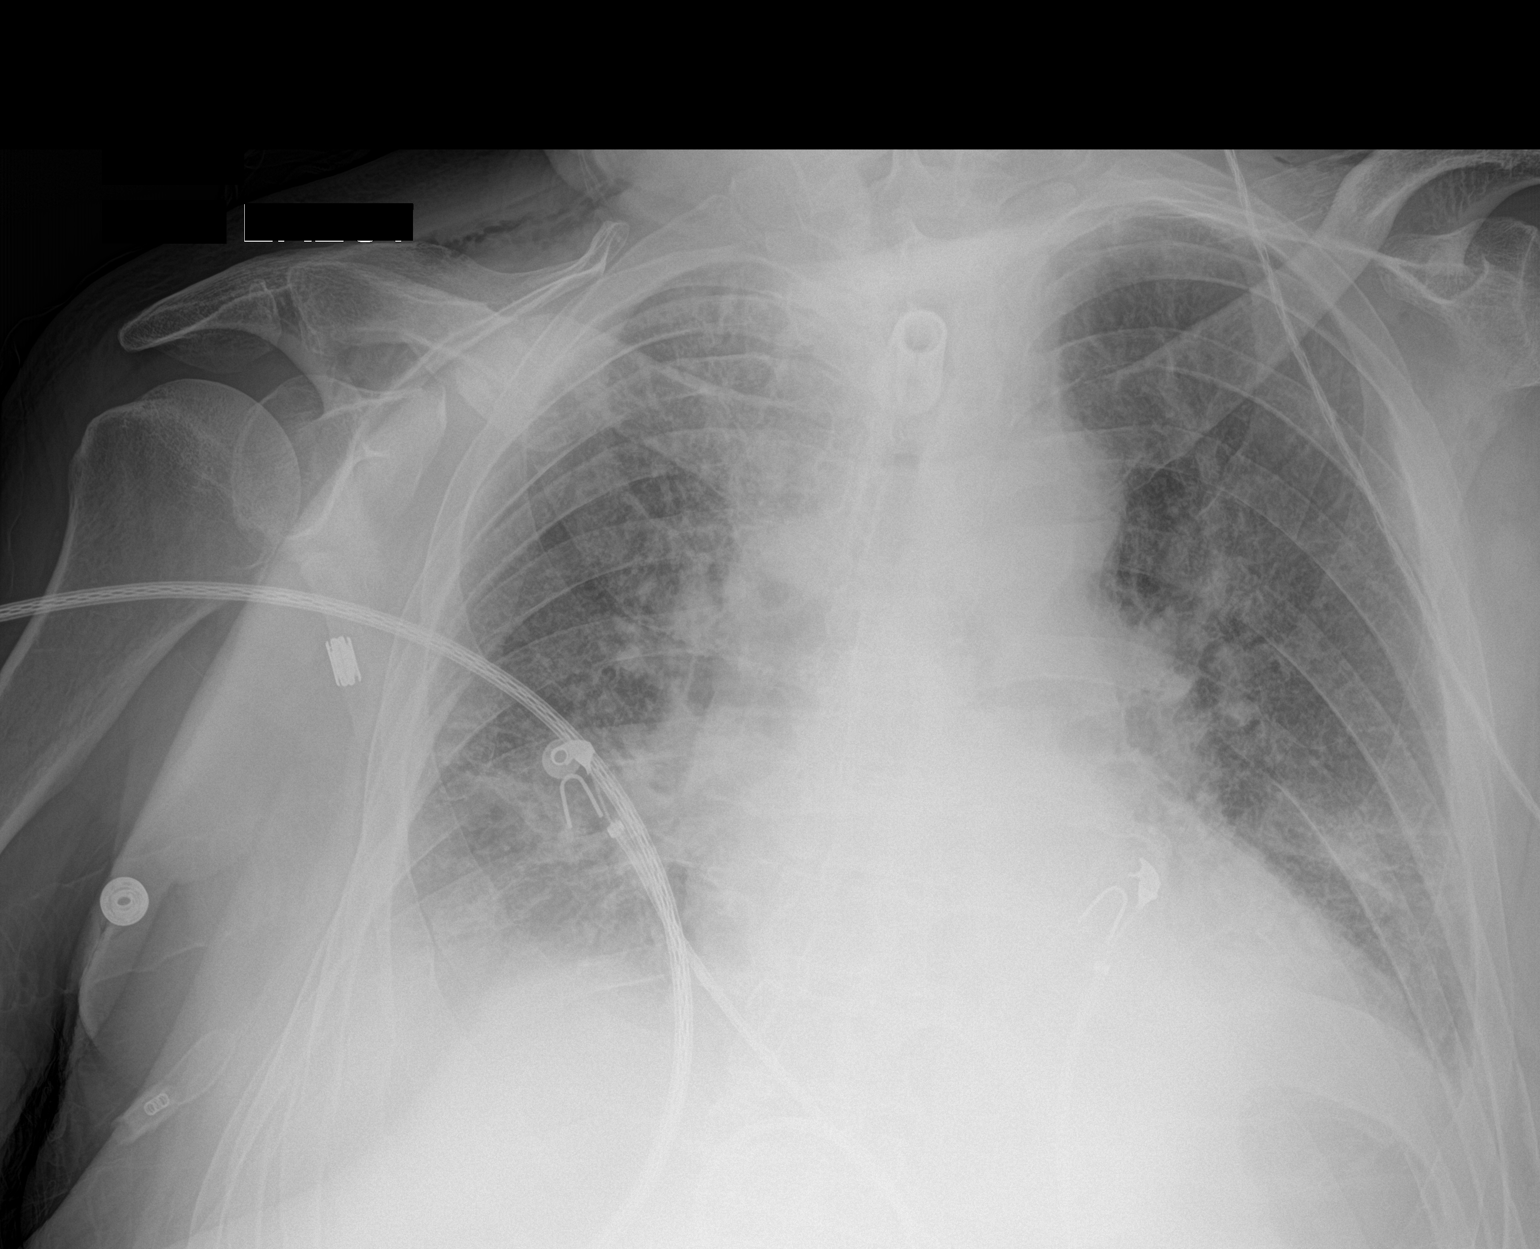

[1 of 1 positions shown; findings below may reference images not displayed]

FINDINGS: Tracheostomy tube tip in satisfactory position projecting
approximately 5 cm above carina. Significant reduction in
pneumomediastinum and subcutaneous emphysema since the examination 2
days ago. Improved interstitial opacities throughout both lungs,
though mild interstitial opacities persist. New consolidation in the
right lower lobe silhouetting the right hemidiaphragm. New linear
atelectasis in the lung bases. Cardiac silhouette moderately
enlarged, unchanged.
IMPRESSION: 1. Tracheostomy tube tip in satisfactory position approximately 5 cm
above the carina.
2. Significant reduction in pneumomediastinum and subcutaneous
emphysema since the examination 2 days ago.
3. Improved interstitial pulmonary edema, though mild interstitial
edema persists.
4. New bibasilar atelectasis and new dense atelectasis and/or
pneumonia in the right lower lobe.

## 2018-05-02 ENCOUNTER — Telehealth: Payer: Self-pay

## 2018-05-02 NOTE — Telephone Encounter (Signed)
I spoke with the patient's sister to confirm his PCP.  She stated that he is deceased. VDM (DD)

## 2019-01-14 IMAGING — DX DG FEMUR 2+V*L*
4 series · 4 of 4 positions shown · non-contrast
Comparison: None.

CLINICAL DATA: Fell while attempting to get out of bed unassisted
today.

EXAM:
LEFT FEMUR 2 VIEWS

[femur ap (1 of 2)]
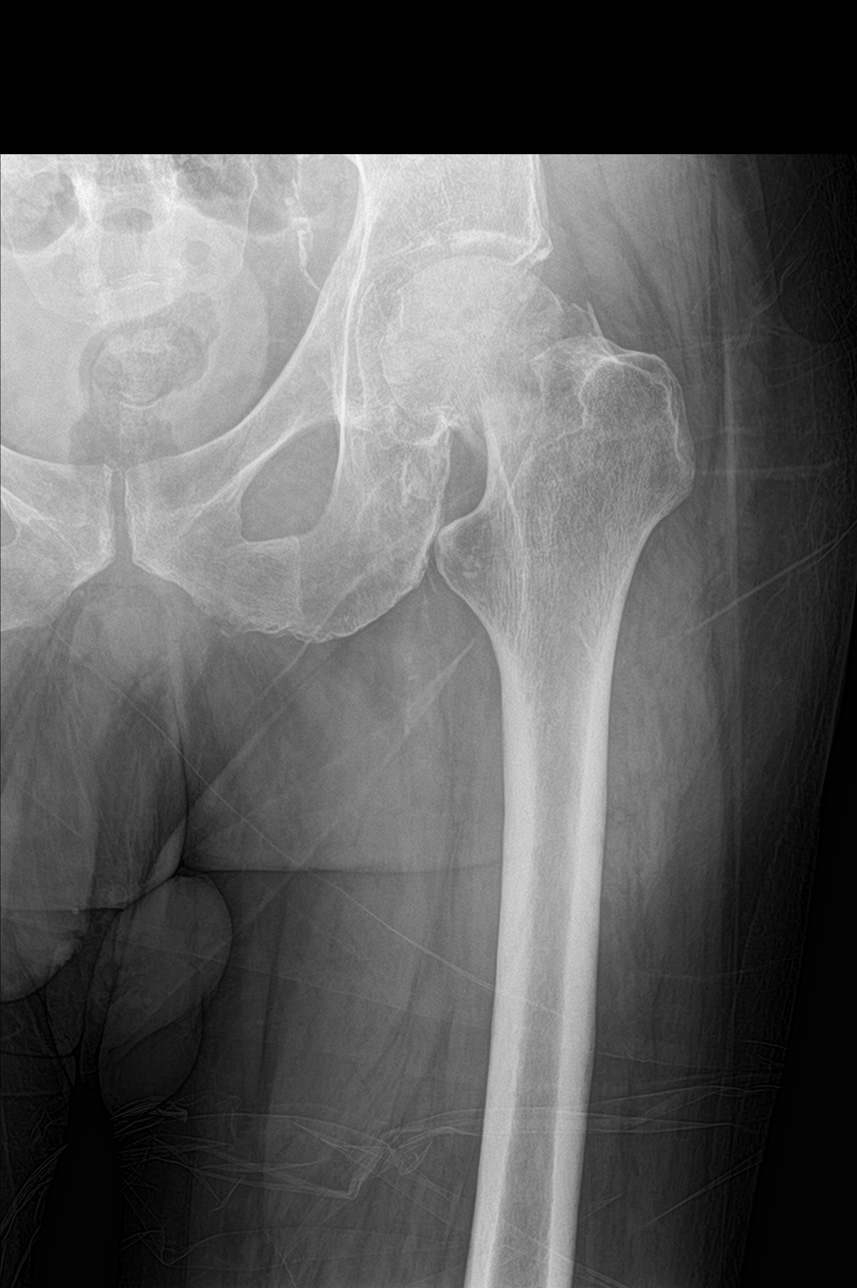

[femur lat (1 of 2)]
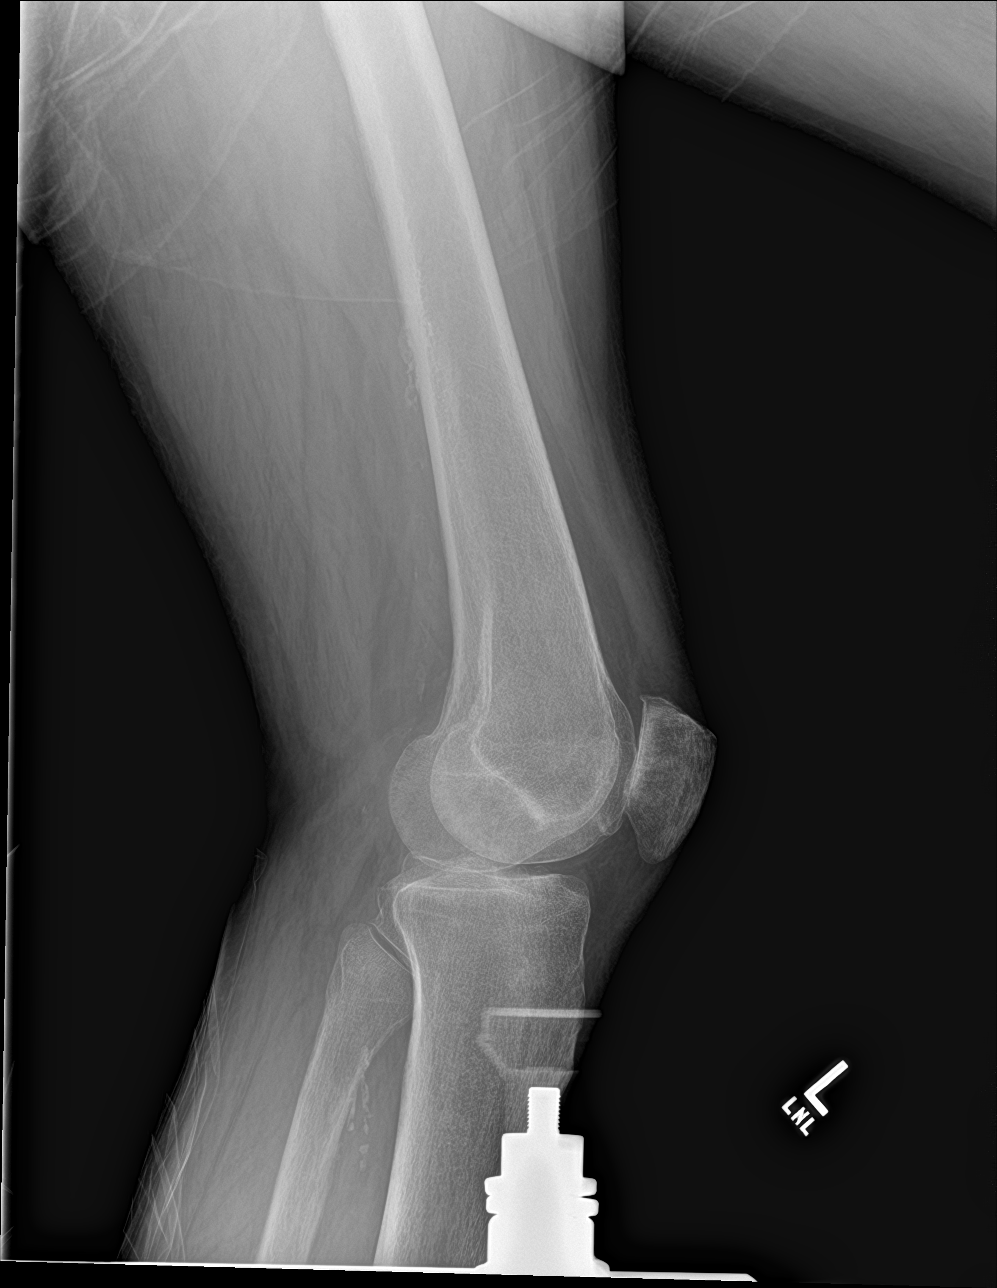

[femur lat (2 of 2)]
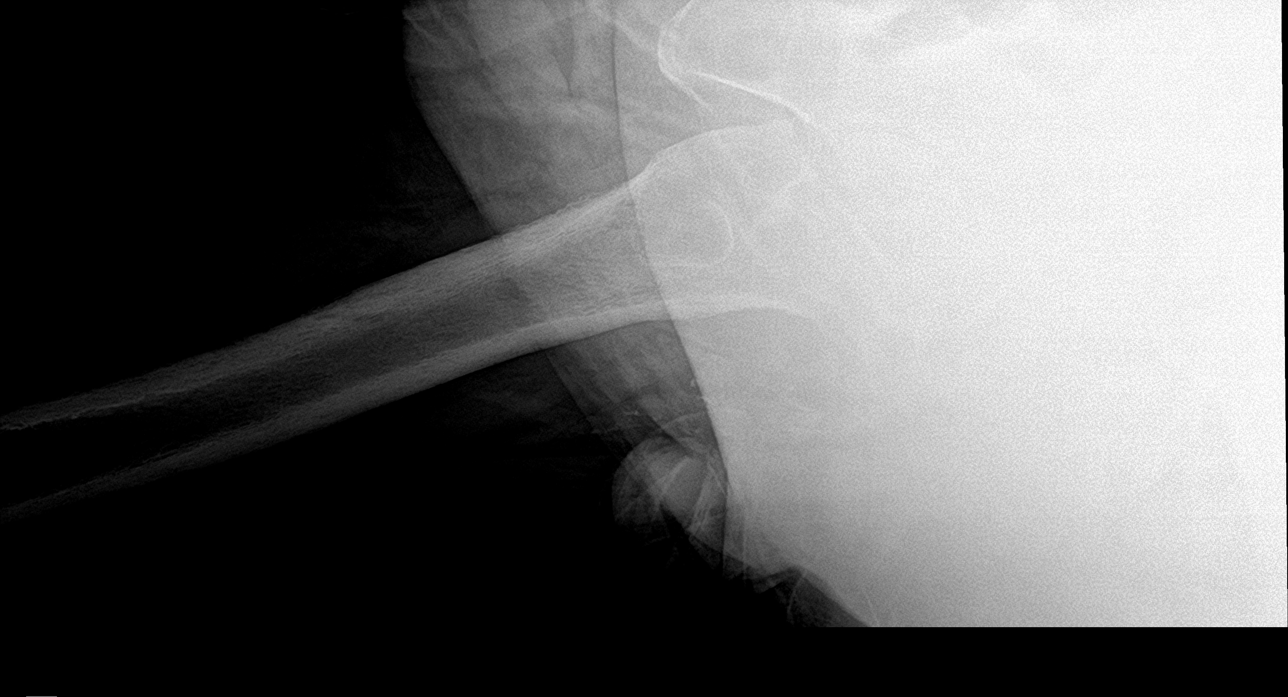

[femur ap (2 of 2)]
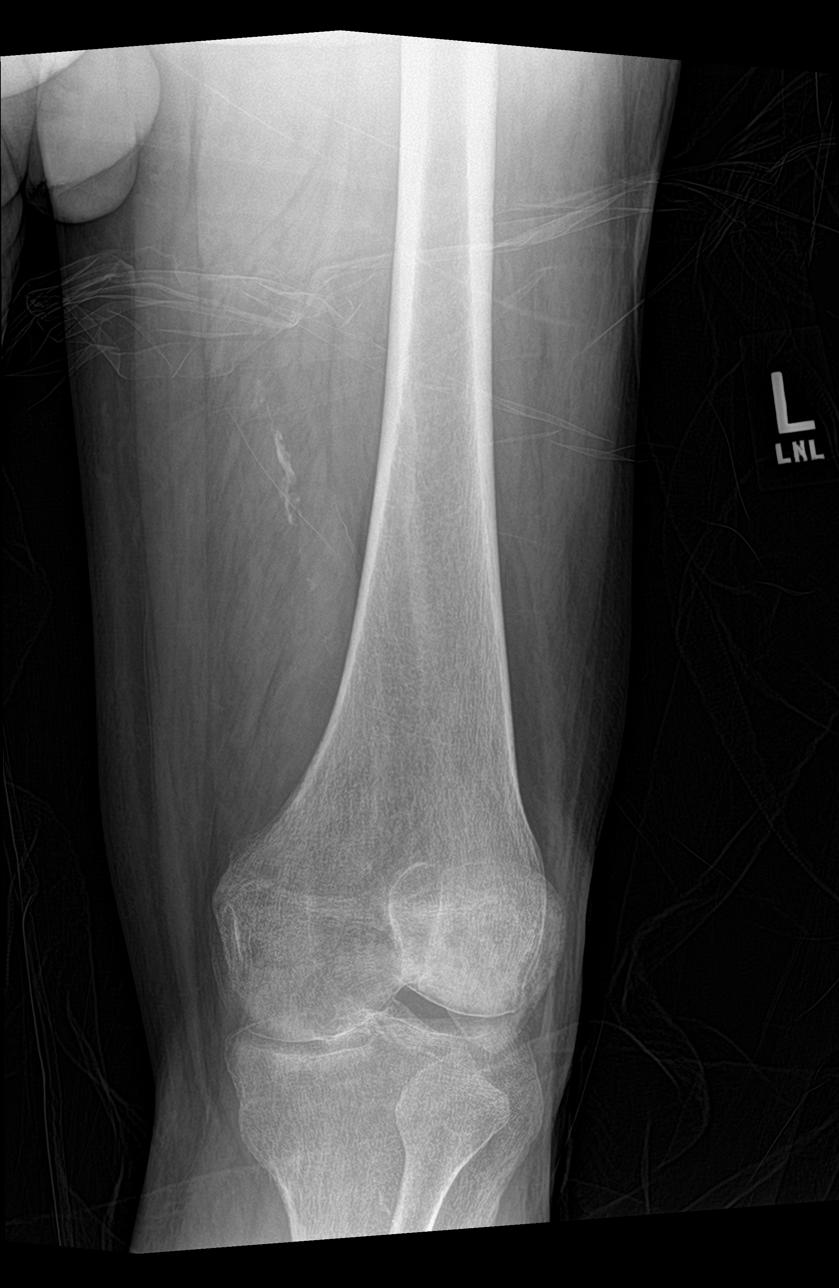

[4 of 4 positions shown; findings below may reference images not displayed]

FINDINGS: There is a subcapital left hip fracture. Remainder of the femur is
intact. No dislocation. No bone lesion or bony destruction.
IMPRESSION: Subcapital left hip fracture

## 2019-01-14 IMAGING — CR DG CHEST 1V PORT
2 series · 2 of 2 positions shown · non-contrast
Comparison: 11/27/2016

CLINICAL DATA: sob

EXAM:
PORTABLE CHEST 1 VIEW

[AP (1 of 2)]
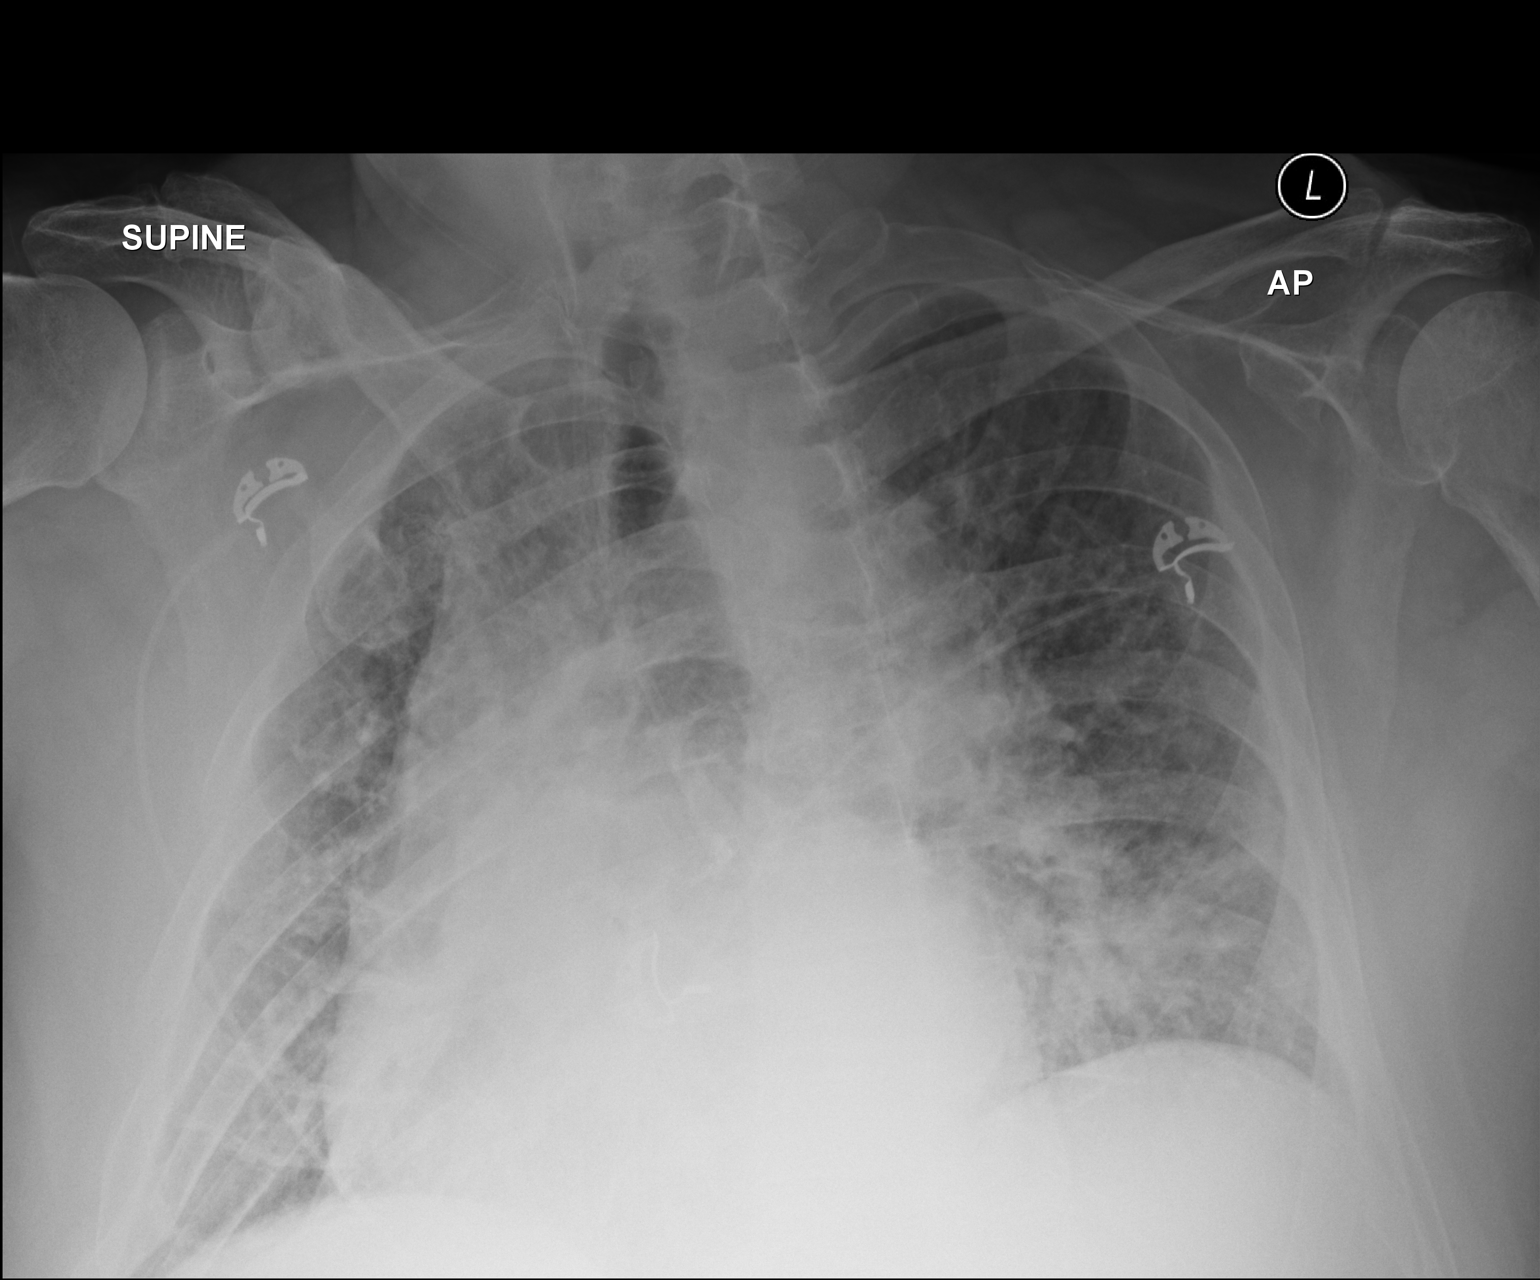

[AP (2 of 2)]
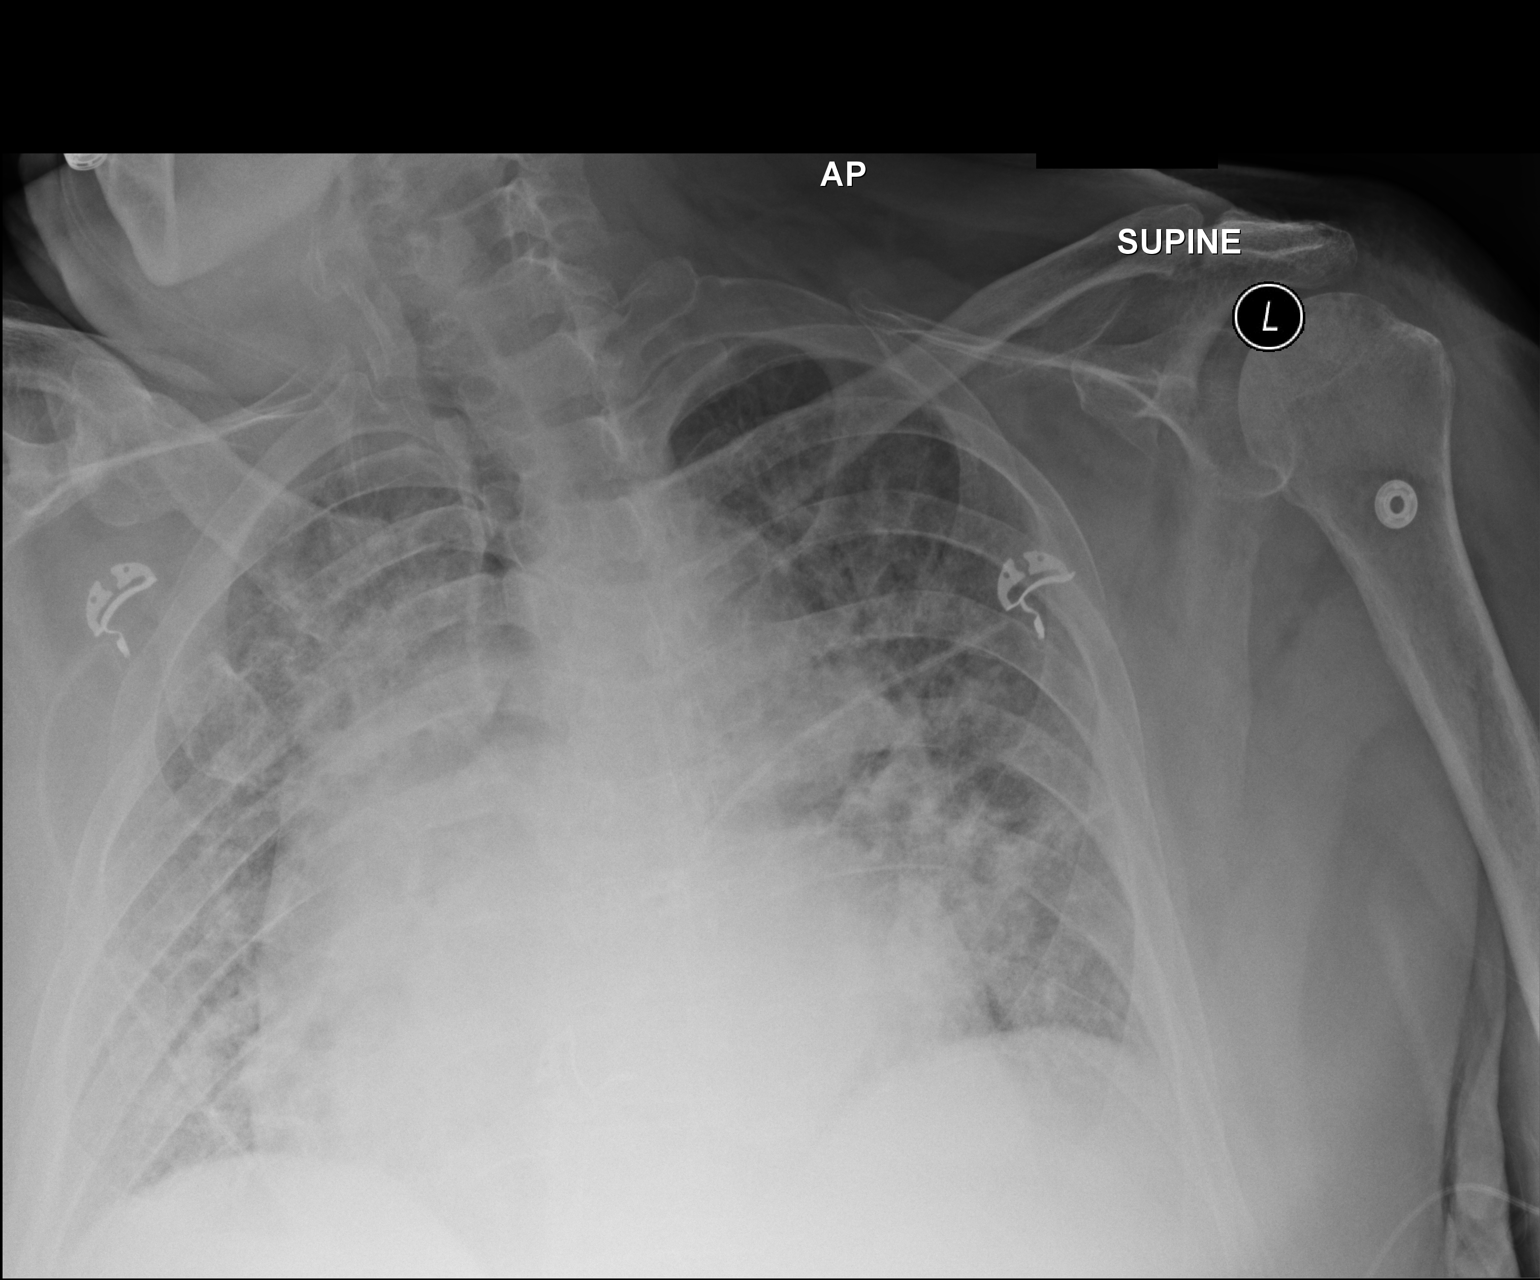

[2 of 2 positions shown; findings below may reference images not displayed]

FINDINGS: The heart is enlarged. There are airspace filling opacities
bilaterally, particularly well seen at the left lung base. Patient
is rotated.
IMPRESSION: Bilateral airspace filling opacities consistent with pulmonary edema
and/or infectious process.

## 2019-01-14 IMAGING — DX DG CHEST 1V
1 series · 1 of 1 positions shown · non-contrast
Comparison: 02/24/2016

CLINICAL DATA: Fell today.  Left hip fracture.

EXAM:
CHEST 1 VIEW

[chest ap]
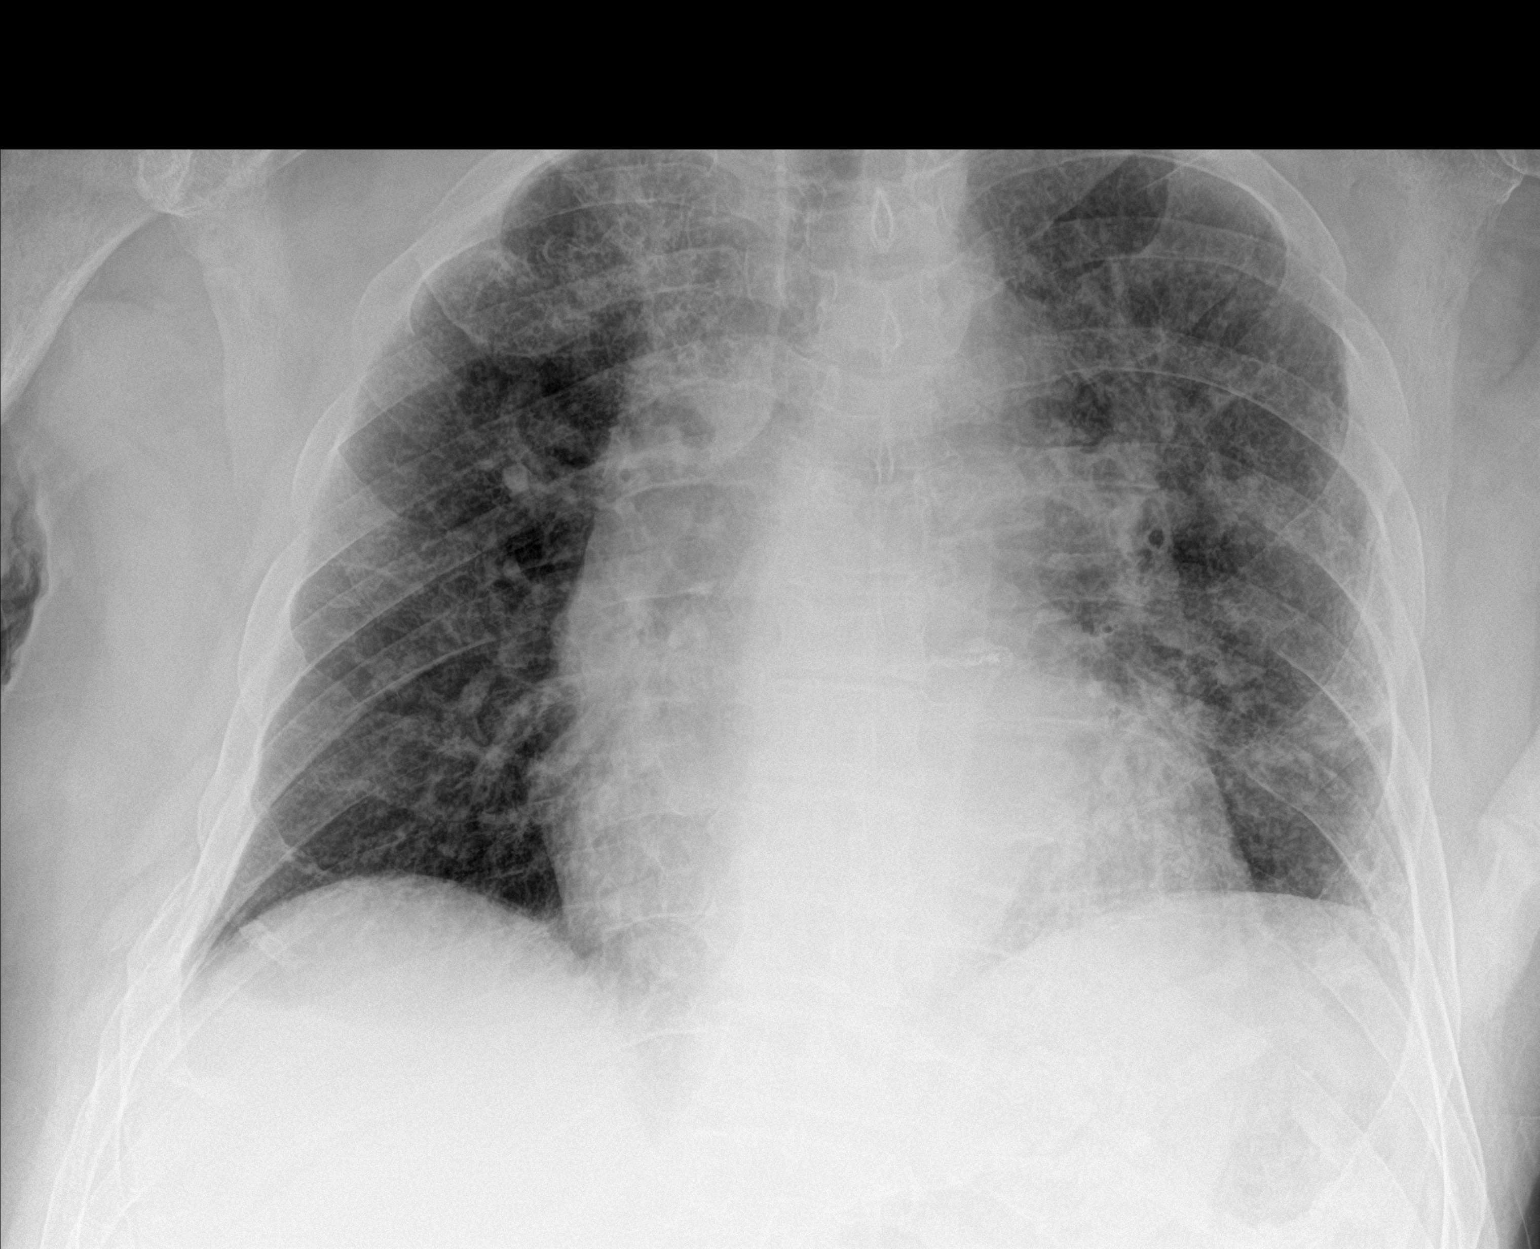

[1 of 1 positions shown; findings below may reference images not displayed]

FINDINGS: Diffuse interstitial coarsening is probably chronic. No
consolidation. No effusion. No pneumothorax. New unchanged hilar,
mediastinal and cardiac contours.
IMPRESSION: Chronic appearing interstitial coarsening. No consolidation or
effusion.

## 2019-01-14 IMAGING — DX DG PELVIS 1-2V
2 series · 2 of 2 positions shown · non-contrast
Comparison: None.

CLINICAL DATA: Fell today while trying to get out of bed
unassisted.

EXAM:
PELVIS - 1-2 VIEW

[pelvis ap (1 of 2)]
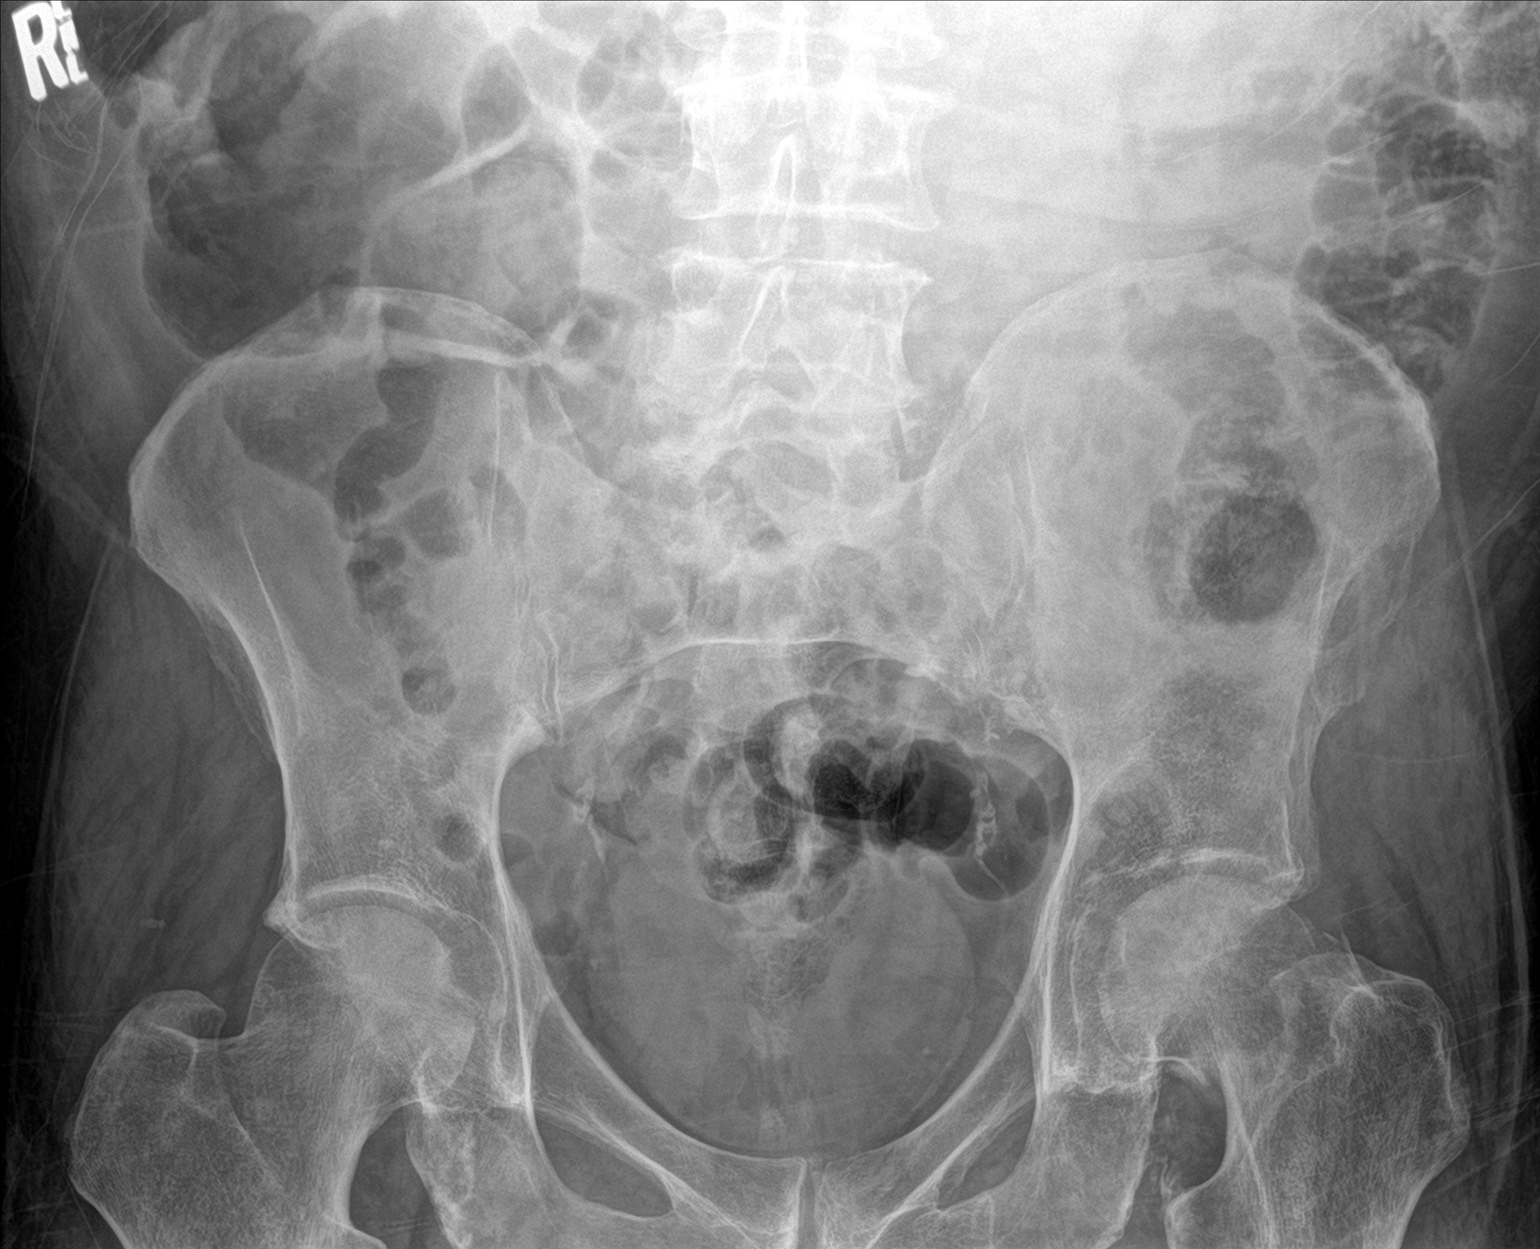

[pelvis ap (2 of 2)]
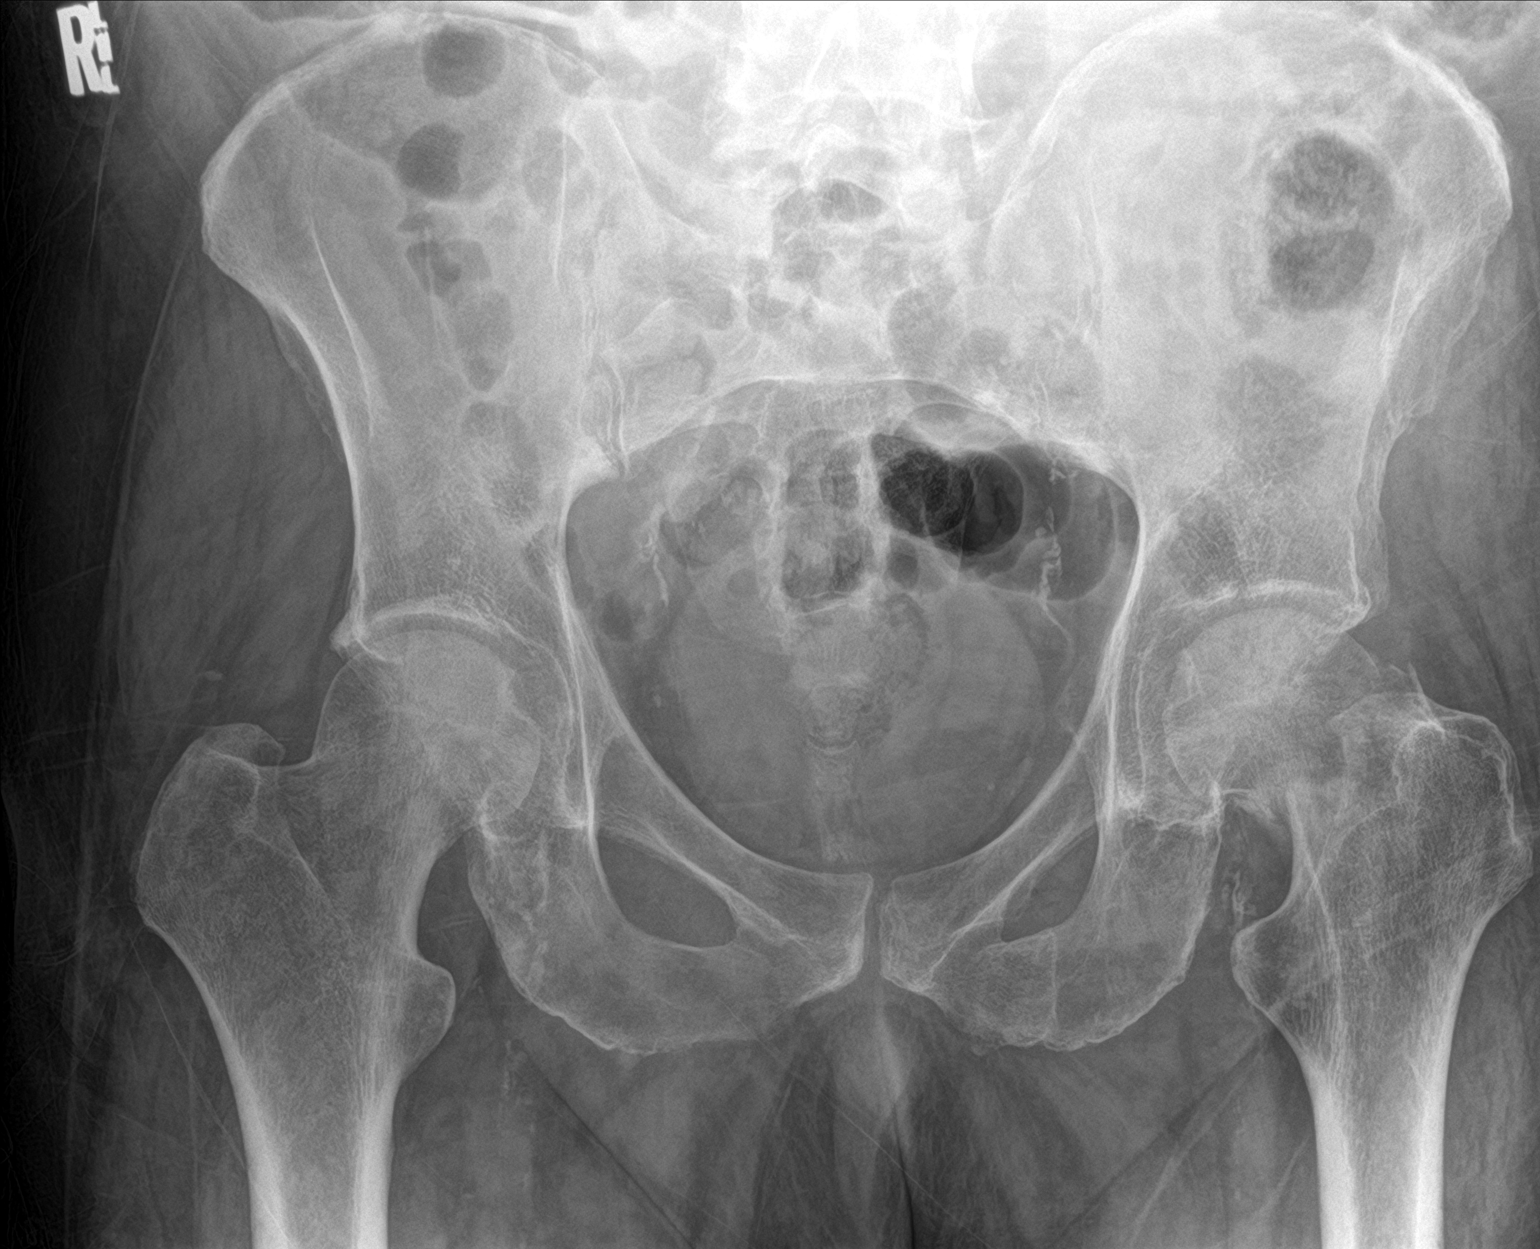

[2 of 2 positions shown; findings below may reference images not displayed]

FINDINGS: There is an acute subcapital left hip fracture. No dislocation. No
bone lesion or bony destruction. Pelvis appears intact.
IMPRESSION: Subcapital left hip fracture

## 2019-01-15 IMAGING — CR DG CHEST 1V PORT
1 series · 1 of 1 positions shown · non-contrast
Comparison: 11/27/2016 and 02/24/2016

CLINICAL DATA: Shortness of breath.

EXAM:
PORTABLE CHEST 1 VIEW

[AP]
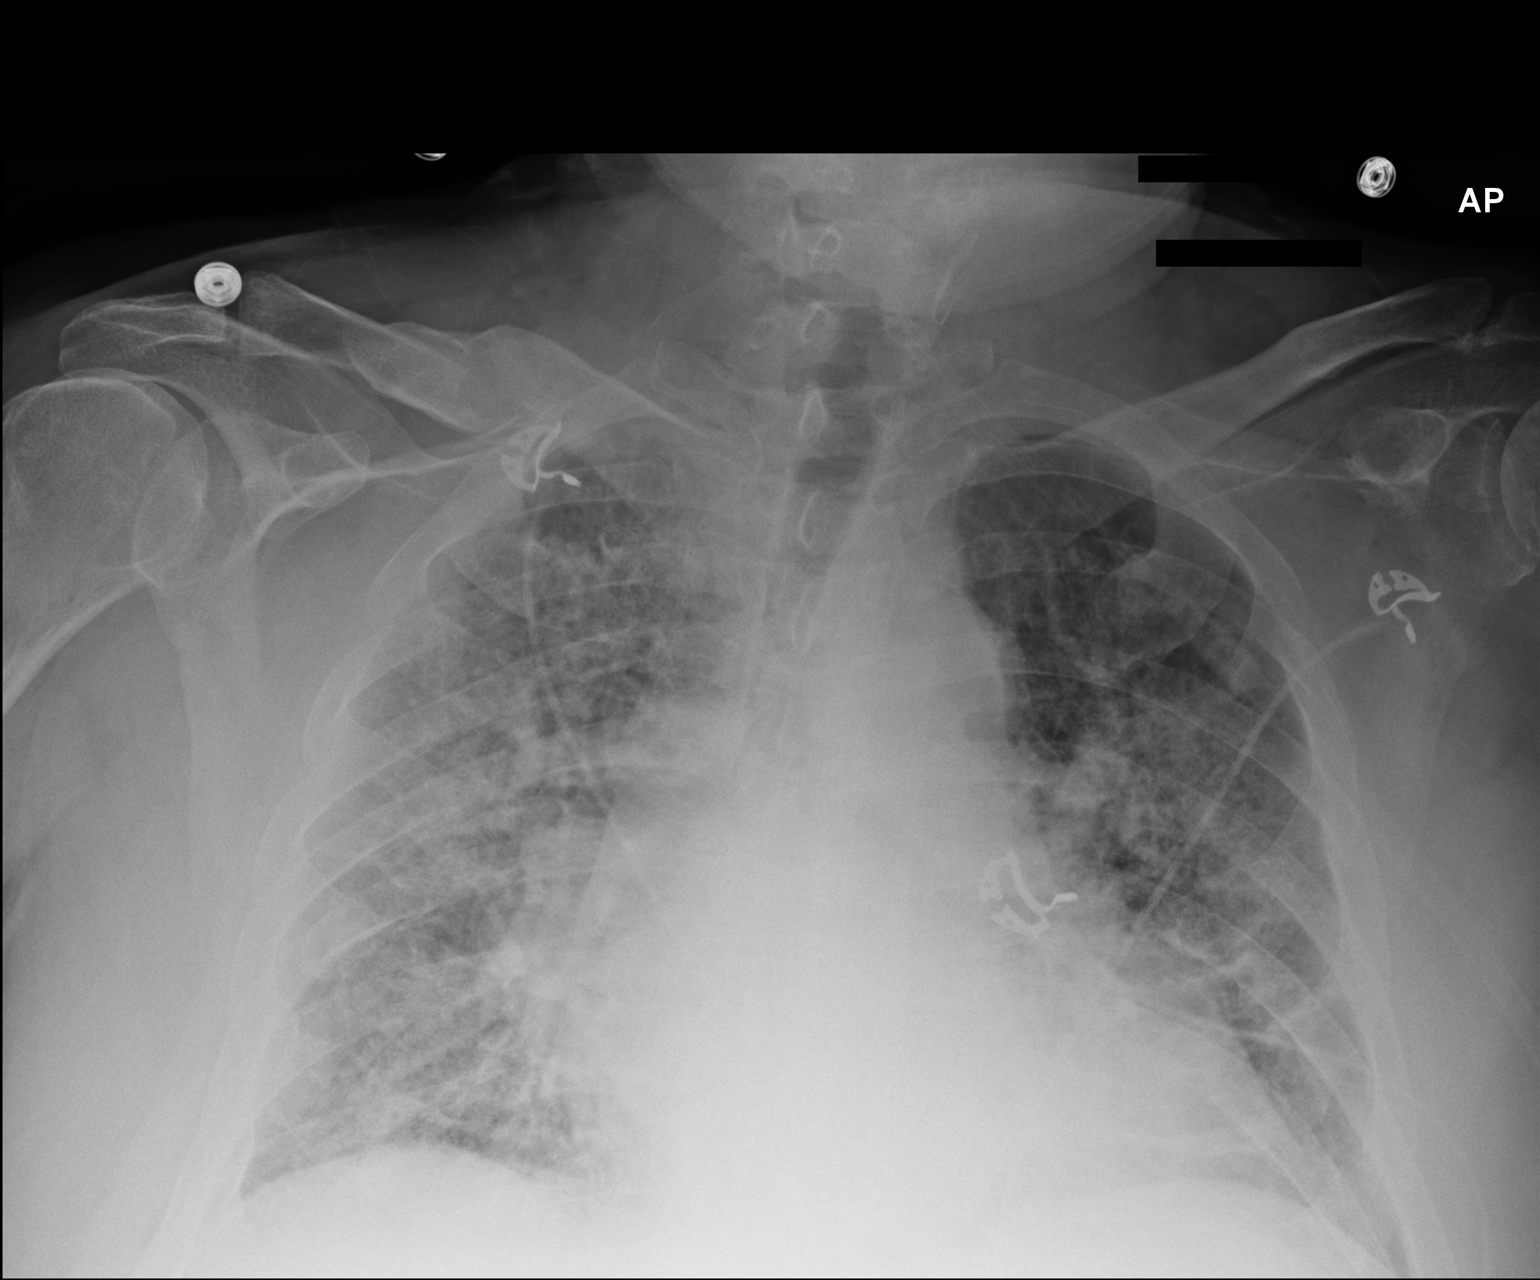

[1 of 1 positions shown; findings below may reference images not displayed]

FINDINGS: The patient has progressive bilateral pulmonary infiltrates which
may represent pulmonary edema or progressive pneumonia. Pulmonary
vascularity appears normal in the overall heart size is normal.
IMPRESSION: Probable progressive bilateral pneumonia.

## 2019-01-18 IMAGING — CR DG CHEST 1V PORT
1 series · 1 of 1 positions shown · non-contrast
Comparison: November 30, 2016

ADDENDUM:
There is an old healed right clavicle fracture, stable.
CLINICAL DATA: Shortness of Breath

EXAM:
PORTABLE CHEST 1 VIEW

[AP]
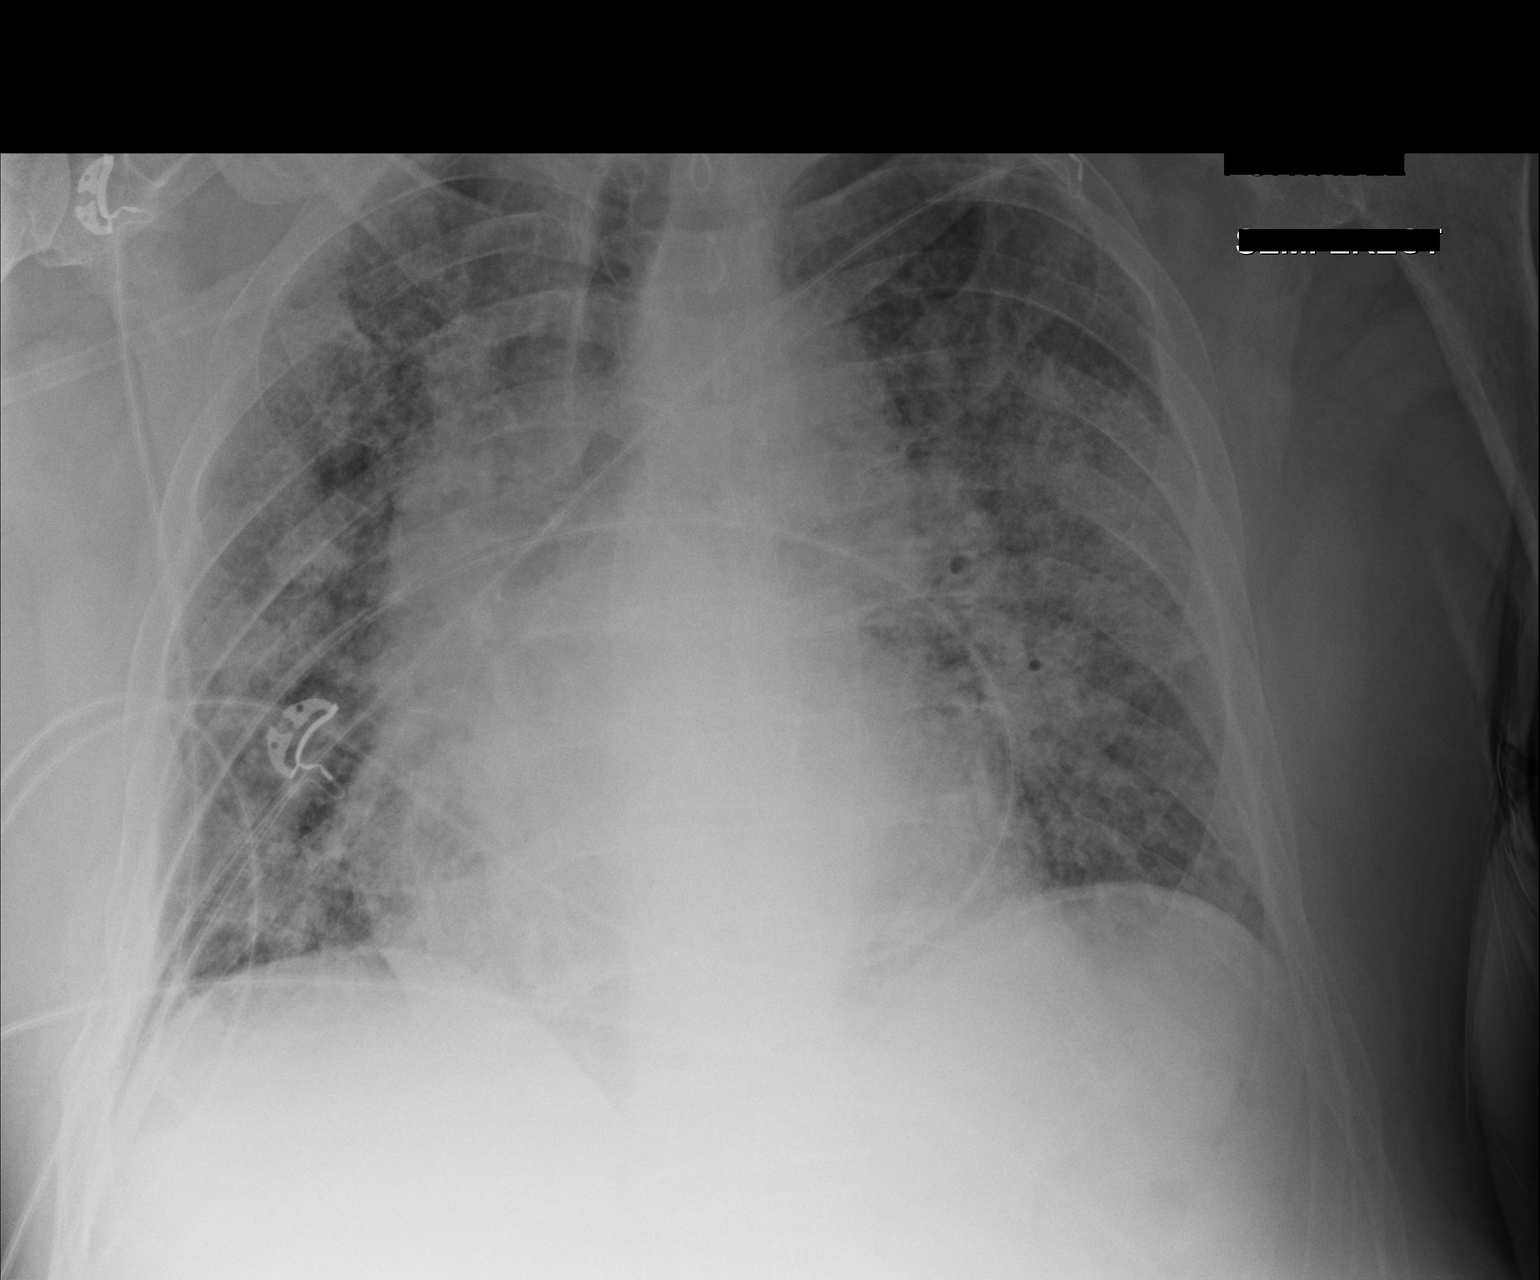

[1 of 1 positions shown; findings below may reference images not displayed]

FINDINGS: Extensive airspace consolidation bilaterally, most severe in the
left upper to mid lung zones, up remains without significant change.
There is cardiomegaly with pulmonary venous hypertension. No
adenopathy evident. No bone lesions.
IMPRESSION: Widespread airspace consolidation bilaterally, somewhat more on the
left than on the right. Evidence a degree of underlying pulmonary
vascular congestion. Question multifocal pneumonia versus pulmonary
edema; both entities may well exist concurrently. The overall
appearance of the lungs and cardiomediastinal silhouette is stable
compared to 1 day prior.

## 2019-01-19 IMAGING — CR DG CHEST 1V PORT
1 series · 1 of 1 positions shown · non-contrast
Comparison: Chest radiograph from one day prior.

CLINICAL DATA: Dyspnea

EXAM:
PORTABLE CHEST 1 VIEW

[AP]
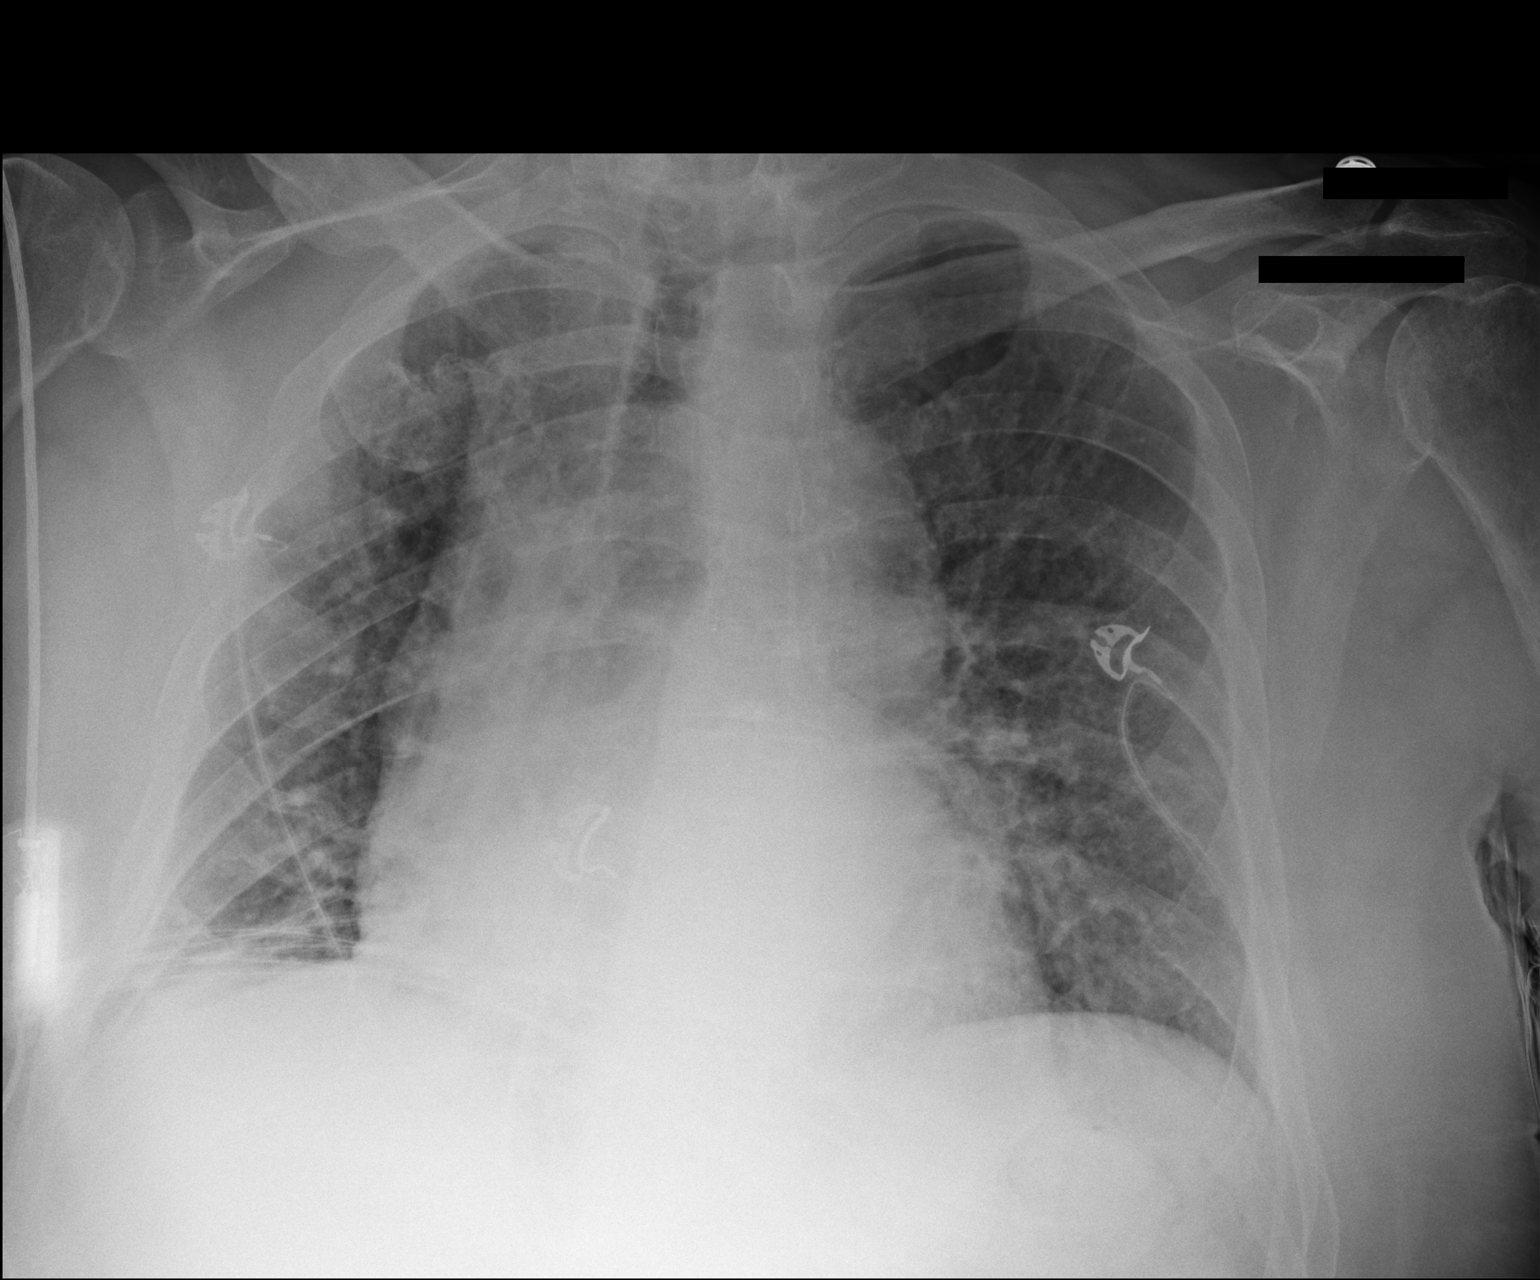

[1 of 1 positions shown; findings below may reference images not displayed]

FINDINGS: Slightly right rotated chest radiograph. Stable cardiomediastinal
silhouette with mild cardiomegaly. No pneumothorax. No pleural
effusion. Mild pulmonary edema, decreased.
IMPRESSION: Mild congestive heart failure, improved.

## 2019-01-20 IMAGING — CR DG CHEST 1V PORT
1 series · 1 of 1 positions shown · non-contrast
Comparison: 12/02/2016

CLINICAL DATA: Shortness of Breath

EXAM:
PORTABLE CHEST 1 VIEW

[AP]
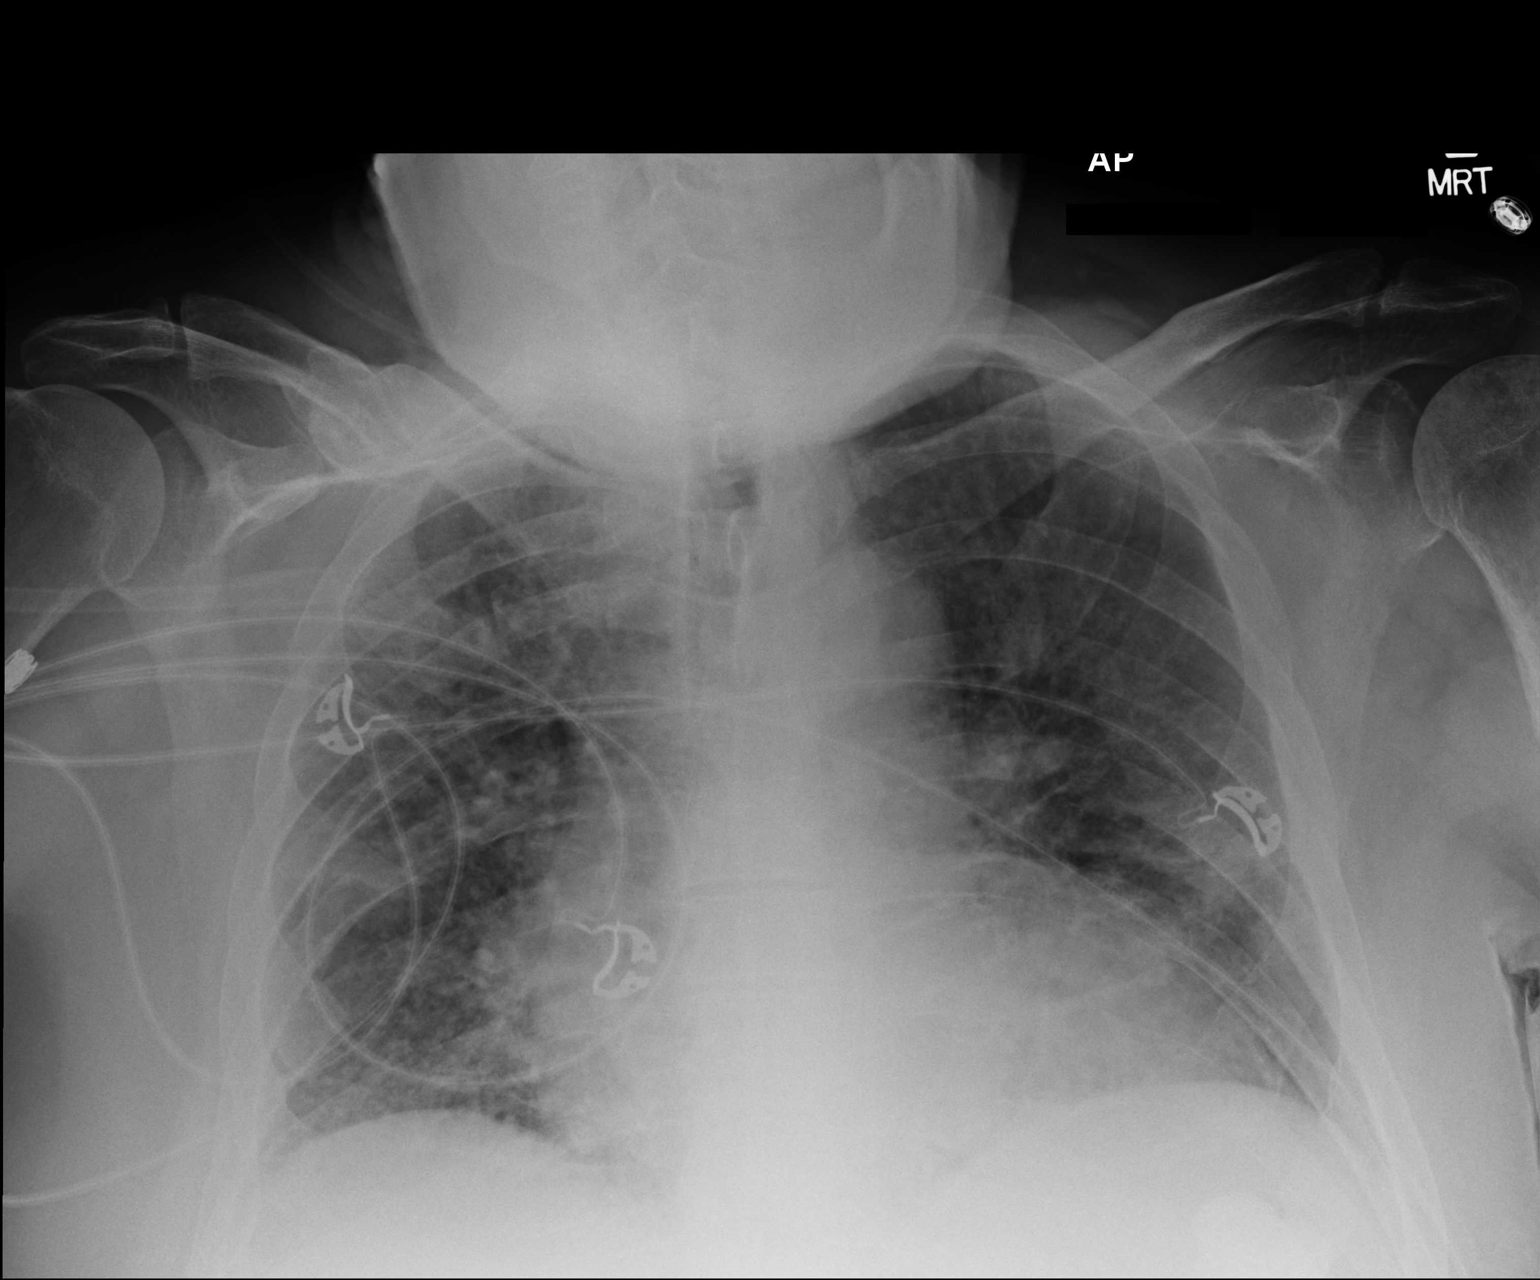

[1 of 1 positions shown; findings below may reference images not displayed]

FINDINGS: Cardiac shadow is enlarged. Lungs are well aerated bilaterally.
Vascular congestion is noted without significant interstitial edema.
No sizable effusion is noted. No bony abnormality is seen.
IMPRESSION: Stable vascular congestion.

## 2019-01-21 IMAGING — CR DG CHEST 1V PORT
1 series · 1 of 1 positions shown · non-contrast
Comparison: 12/03/2016

CLINICAL DATA: Shortness of Breath

EXAM:
PORTABLE CHEST 1 VIEW

[AP]
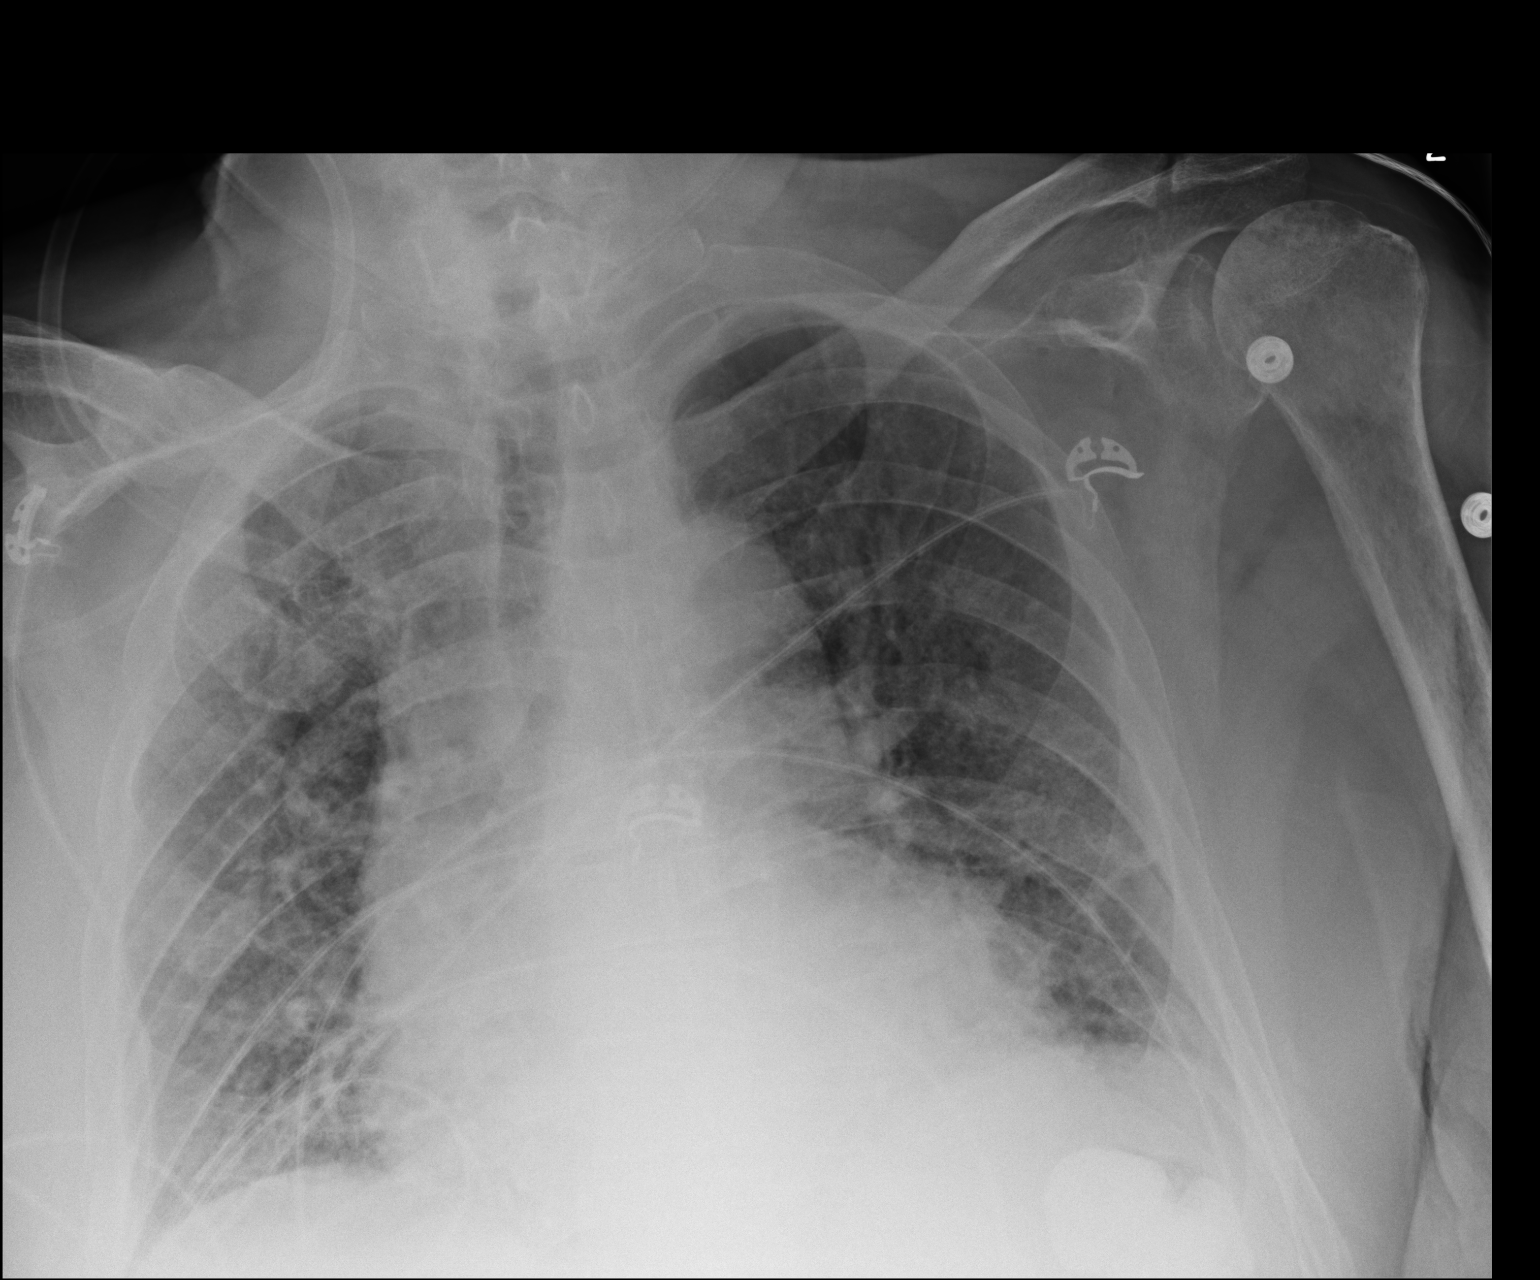

[1 of 1 positions shown; findings below may reference images not displayed]

FINDINGS: Cardiomegaly. Mild interstitial prominence and vascular congestion.
Question mild interstitial edema. No effusions or acute bony
abnormality.
IMPRESSION: Vascular congestion with interstitial prominence, question mild
interstitial edema.

## 2019-01-22 IMAGING — CR DG CHEST 1V PORT
1 series · 1 of 1 positions shown · non-contrast
Comparison: 12/04/2016 .

CLINICAL DATA: Shortness of breath .

EXAM:
PORTABLE CHEST 1 VIEW

[AP]
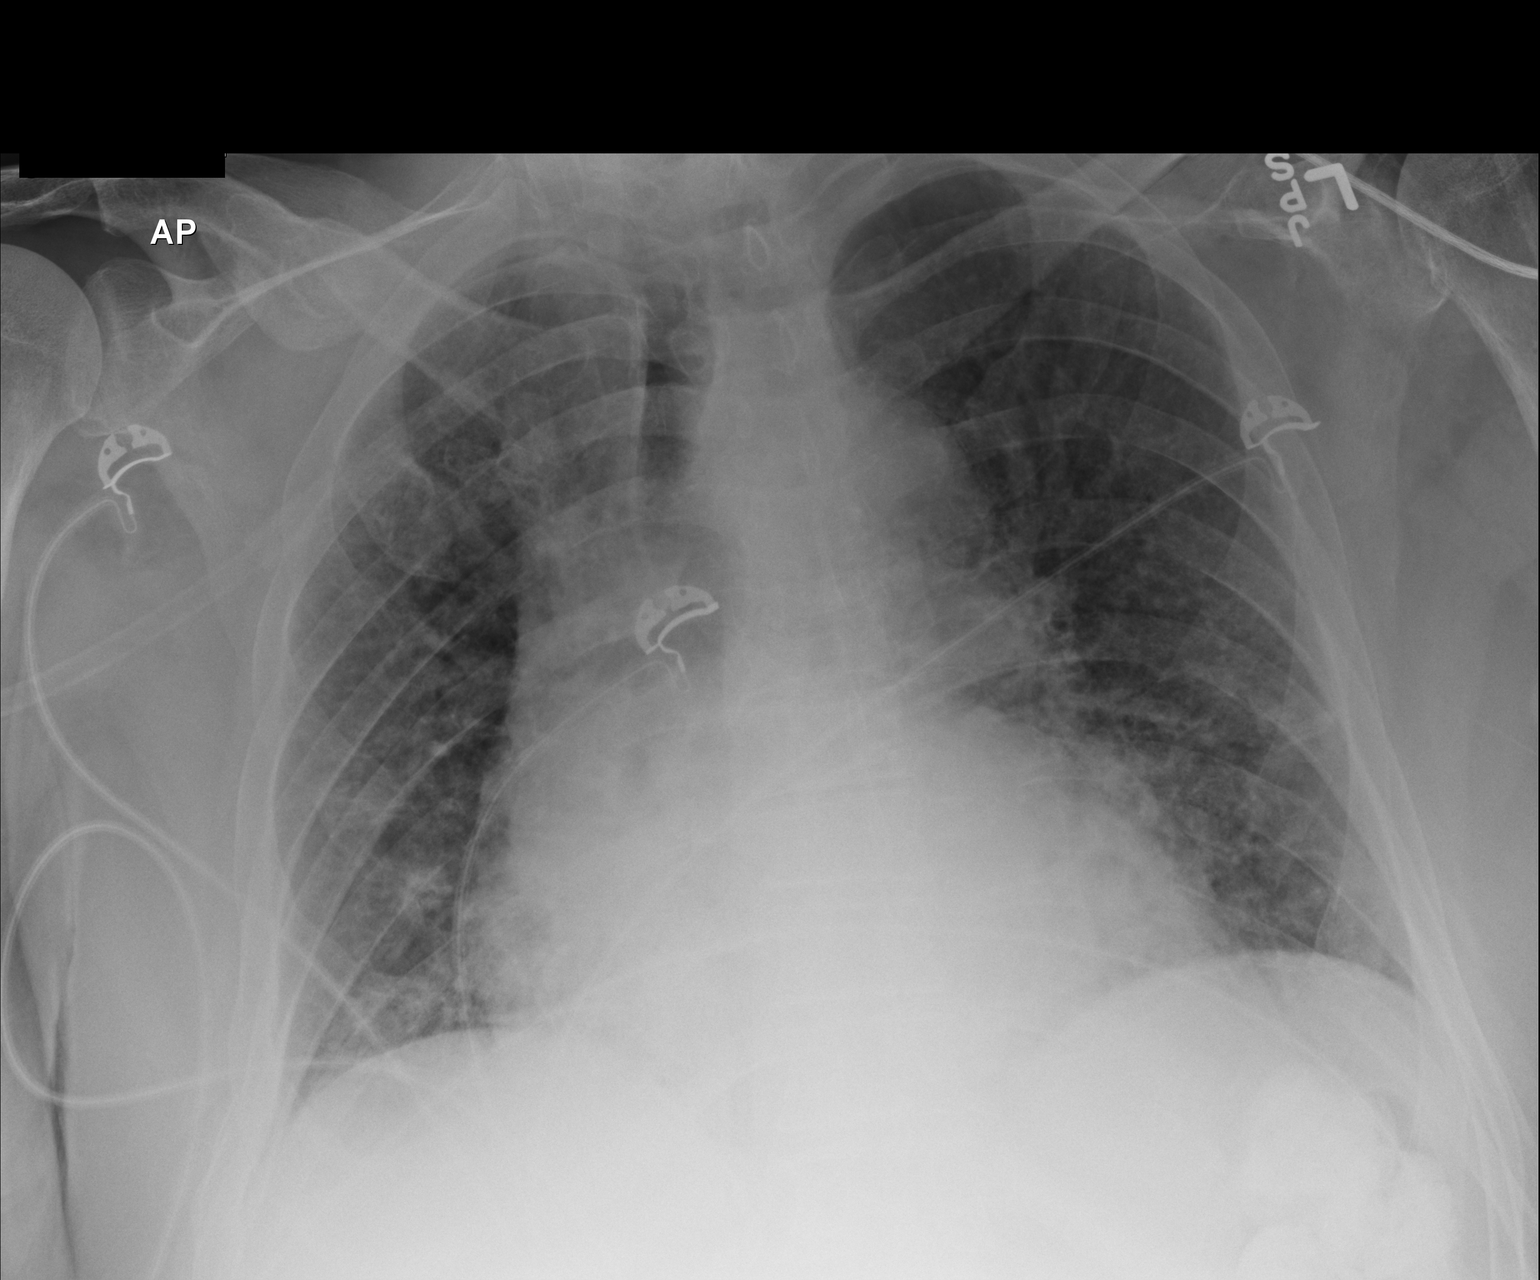

[1 of 1 positions shown; findings below may reference images not displayed]

FINDINGS: Cardiomegaly with bilateral from interstitial prominence. No change
from prior exam. No pleural effusion or pneumothorax P
IMPRESSION: Cardiomegaly with bilateral mild interstitial prominence consistent
interstitial edema again noted. No interim change from prior exam.

## 2019-01-23 IMAGING — CR DG CHEST 1V PORT
1 series · 1 of 1 positions shown · non-contrast
Comparison: 12/05/2016.

CLINICAL DATA: Shortness of breath .

EXAM:
PORTABLE CHEST 1 VIEW

[AP]
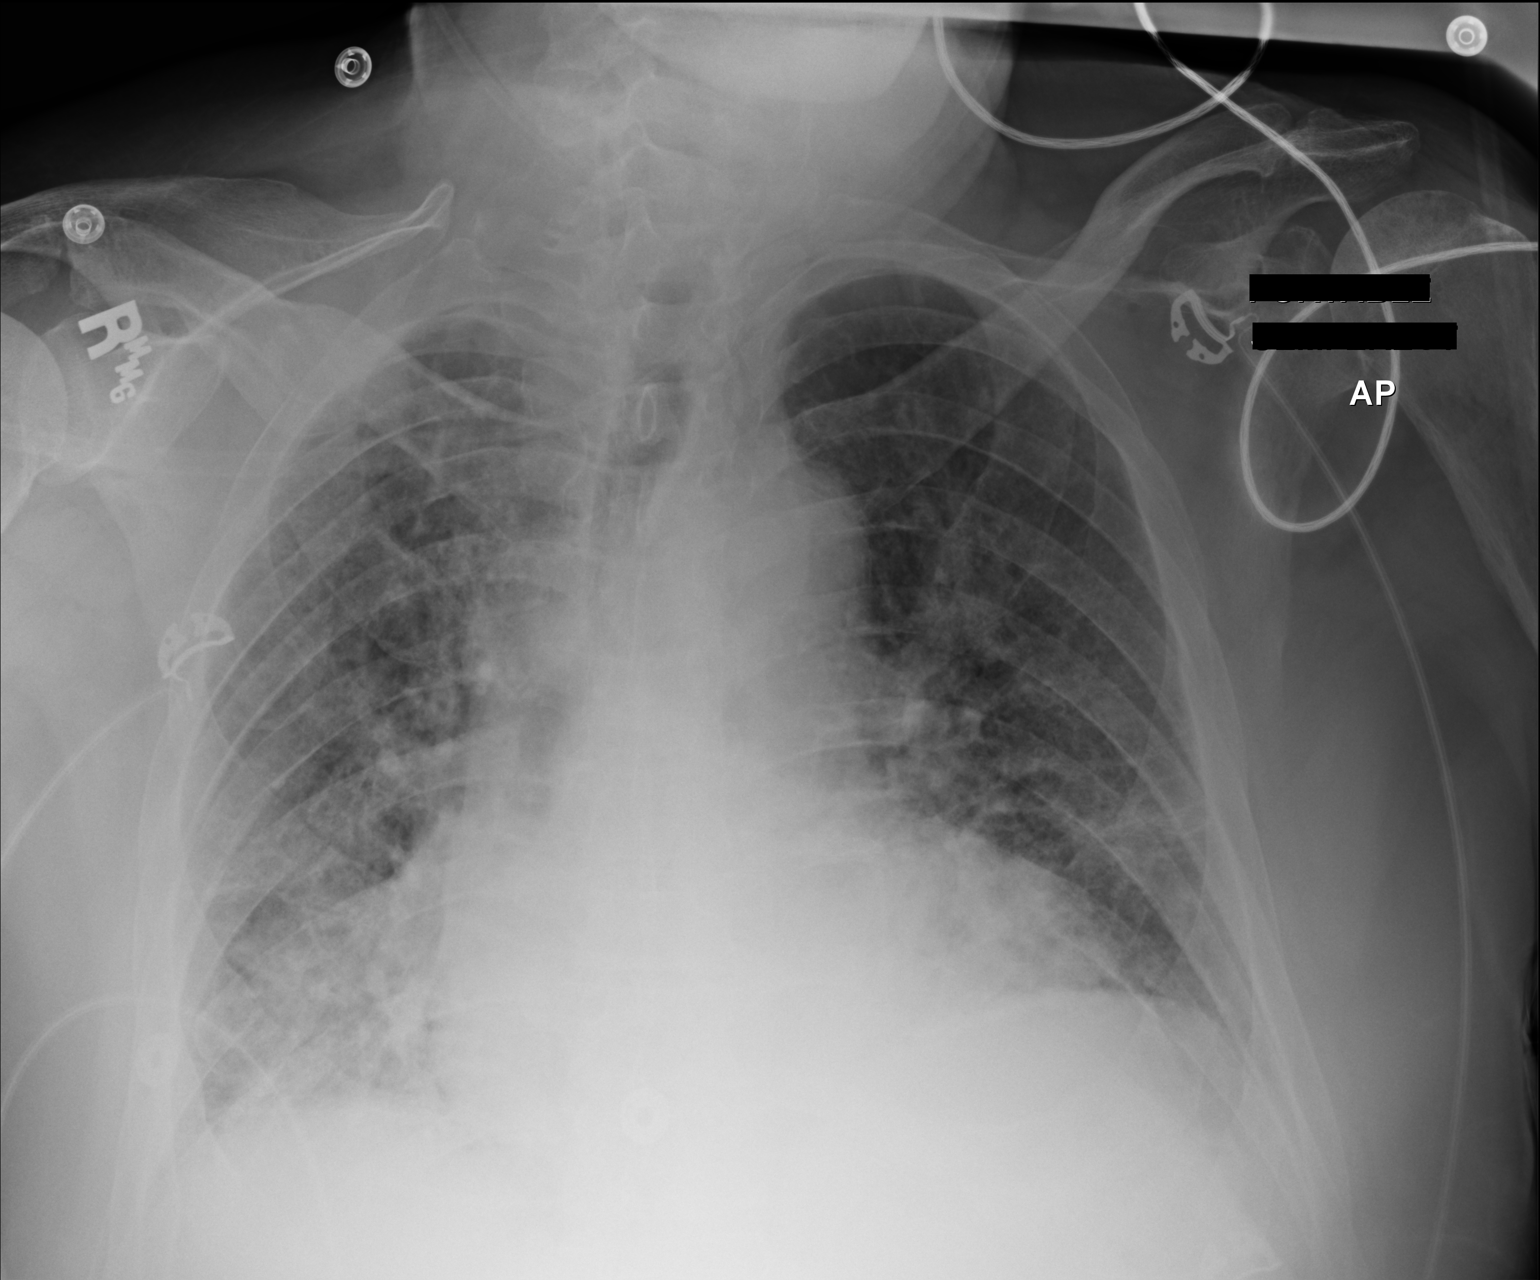

[1 of 1 positions shown; findings below may reference images not displayed]

FINDINGS: Cardiomegaly with progressive bilateral pulmonary infiltrates
consistent with congestive heart failure and pulmonary edema.
Bilateral pneumonia cannot be excluded . Small right pleural
effusion cannot be excluded. No pneumothorax . Healed right
clavicular fracture.
IMPRESSION: 1. Cardiomegaly with progressive bilateral pulmonary infiltrates/
pulmonary edema. Findings consistent with congestive heart failure.
Bilateral pneumonia cannot be excluded. Small right pleural effusion
cannot be excluded.

2.  Low lung volumes.

## 2019-01-24 IMAGING — CR DG CHEST 1V PORT
1 series · 1 of 1 positions shown · non-contrast
Comparison: 12/06/2016 and earlier.

CLINICAL DATA: 60-year-old male with shortness of breath. Recent
fall. Initial encounter.

EXAM:
PORTABLE CHEST 1 VIEW

[AP]
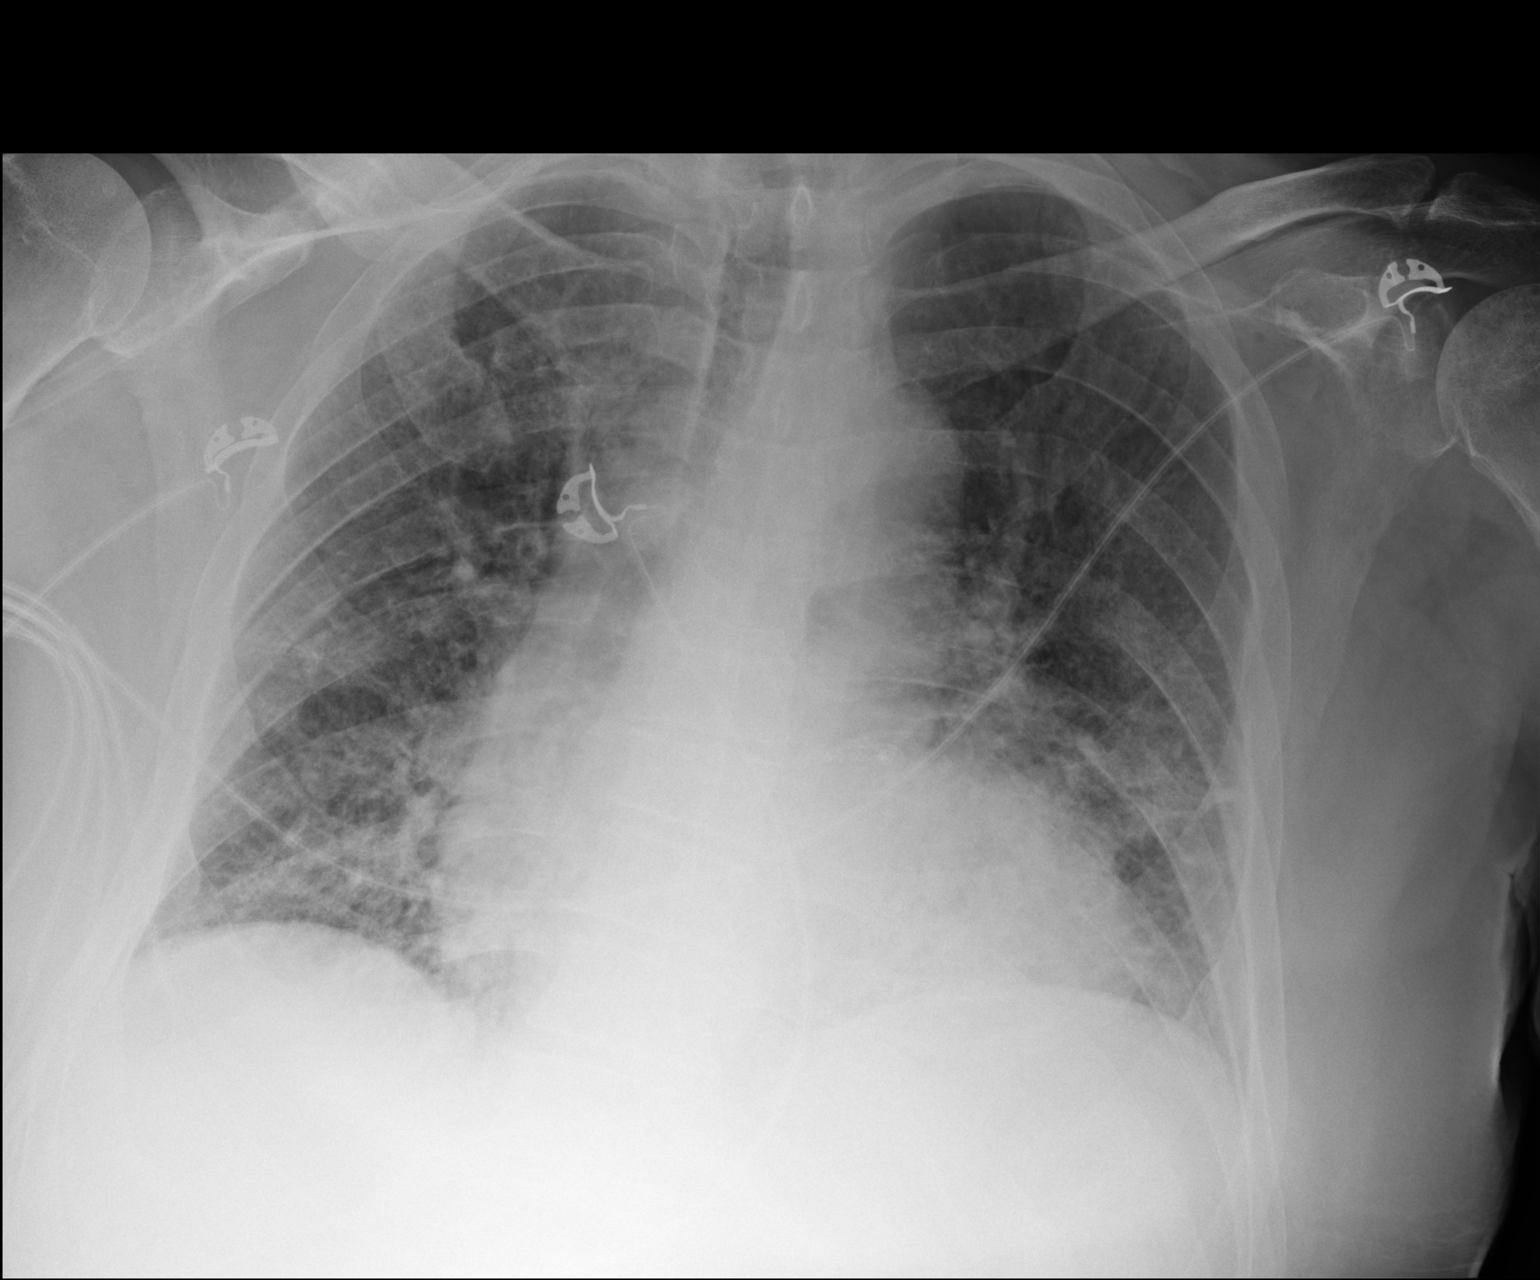

[1 of 1 positions shown; findings below may reference images not displayed]

FINDINGS: Portable AP semi upright view at [id number] hours. Stable cardiomegaly and
mediastinal contours. Widespread increased pulmonary interstitial
markings are chronic to a degree. No superimposed pneumothorax or
pulmonary edema. No pleural effusion is evident. Increased patchy
retrocardiac opacity since 11/27/2016.

Chronic right clavicle deformity.
IMPRESSION: Cardiomegaly and chronic lung disease. Left lung base opacity has
increased since 11/27/2016, suspicious for left lung base pneumonia.
No pleural effusion identified.

## 2019-01-25 IMAGING — CR DG CHEST 1V PORT
1 series · 1 of 1 positions shown · non-contrast
Comparison: Portable chest x-ray December 07, 2016

CLINICAL DATA: Chronic CHF, aspiration pneumonia, underlying COPD.

EXAM:
PORTABLE CHEST 1 VIEW

[AP]
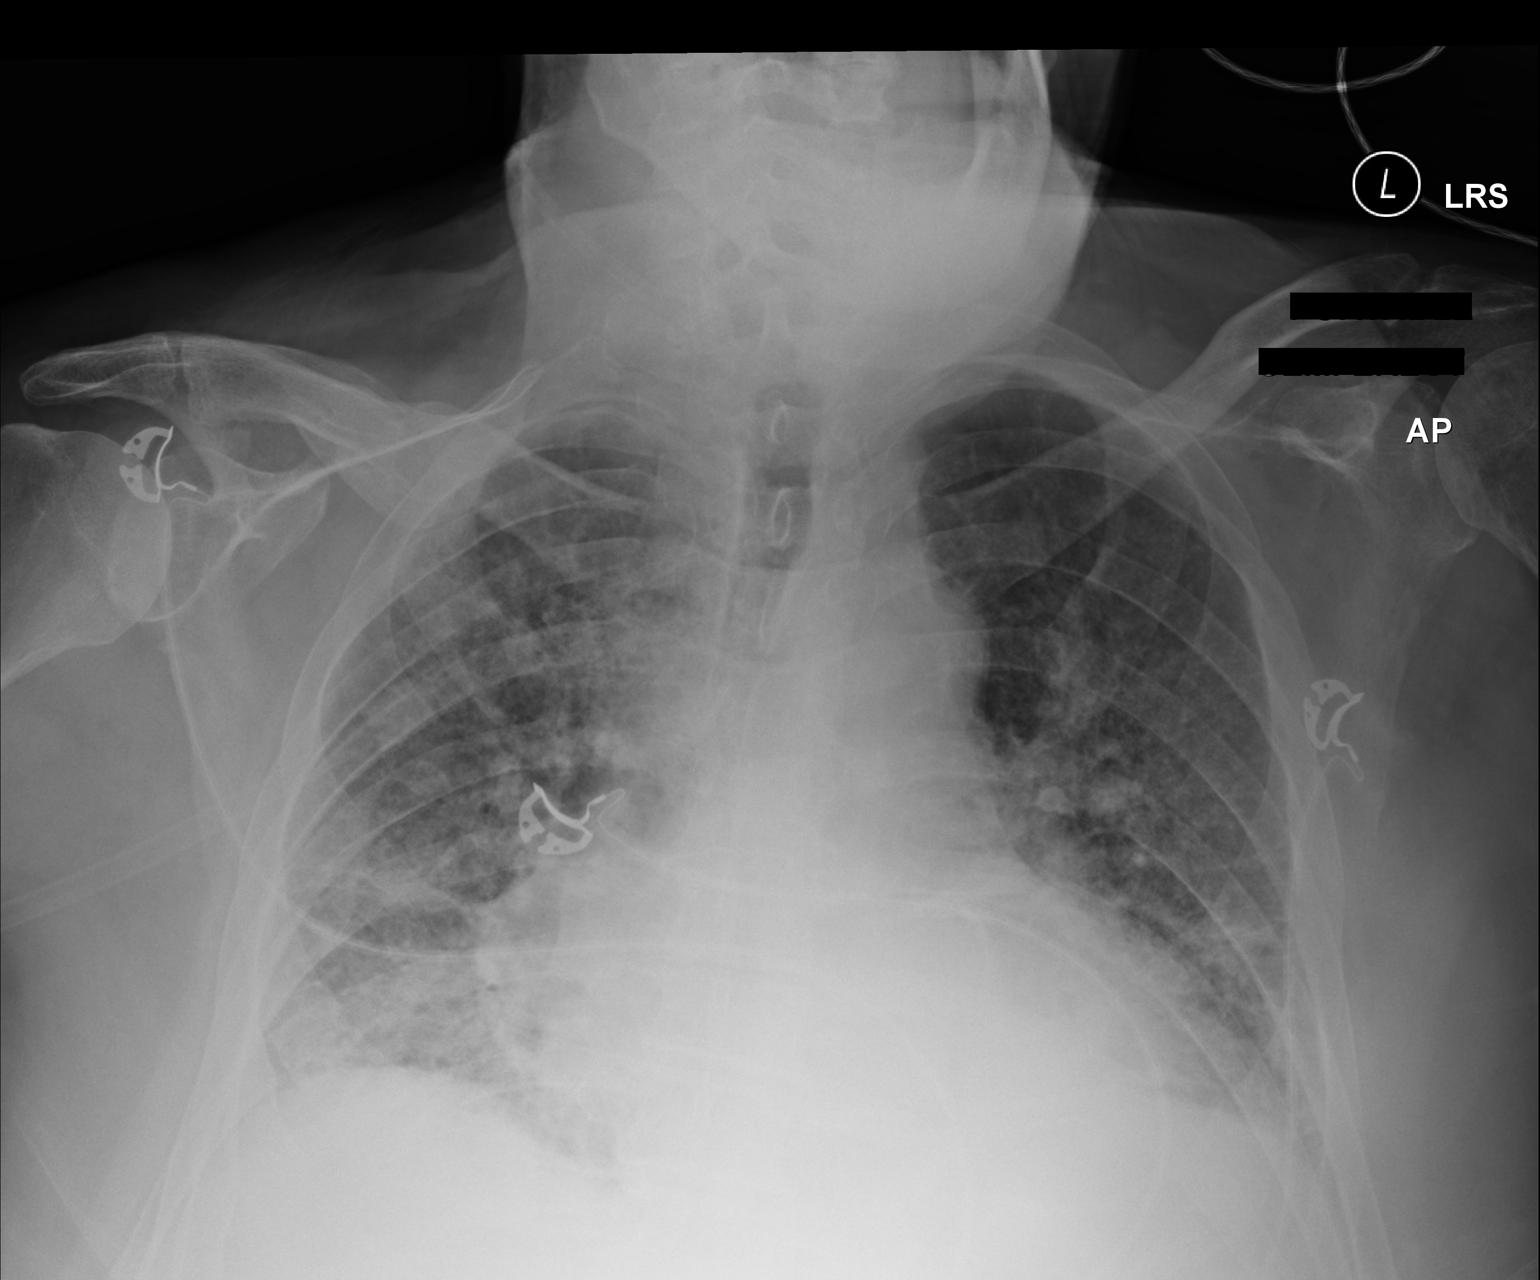

[1 of 1 positions shown; findings below may reference images not displayed]

FINDINGS: The lungs are slightly less well inflated today. The pulmonary
interstitial markings remain increased with areas patchy confluence
noted. There is no large pleural effusion. The cardiac silhouette
remains enlarged. The pulmonary vascularity remains prominent
centrally. There is calcification in the wall of the aortic arch.
There is old deformity of the midshaft of the right clavicle.
IMPRESSION: Decreased aeration of both lungs today which accentuates the
interstitial markings. Persistent bilateral interstitial and
alveolar opacities likely reflect pneumonia though mild CHF is
suspected as well.

## 2019-01-27 IMAGING — CR DG CHEST 1V PORT
1 series · 1 of 1 positions shown · non-contrast
Comparison: December 08, 2016

CLINICAL DATA: Fever and shortness of breath

EXAM:
PORTABLE CHEST 1 VIEW

[AP]
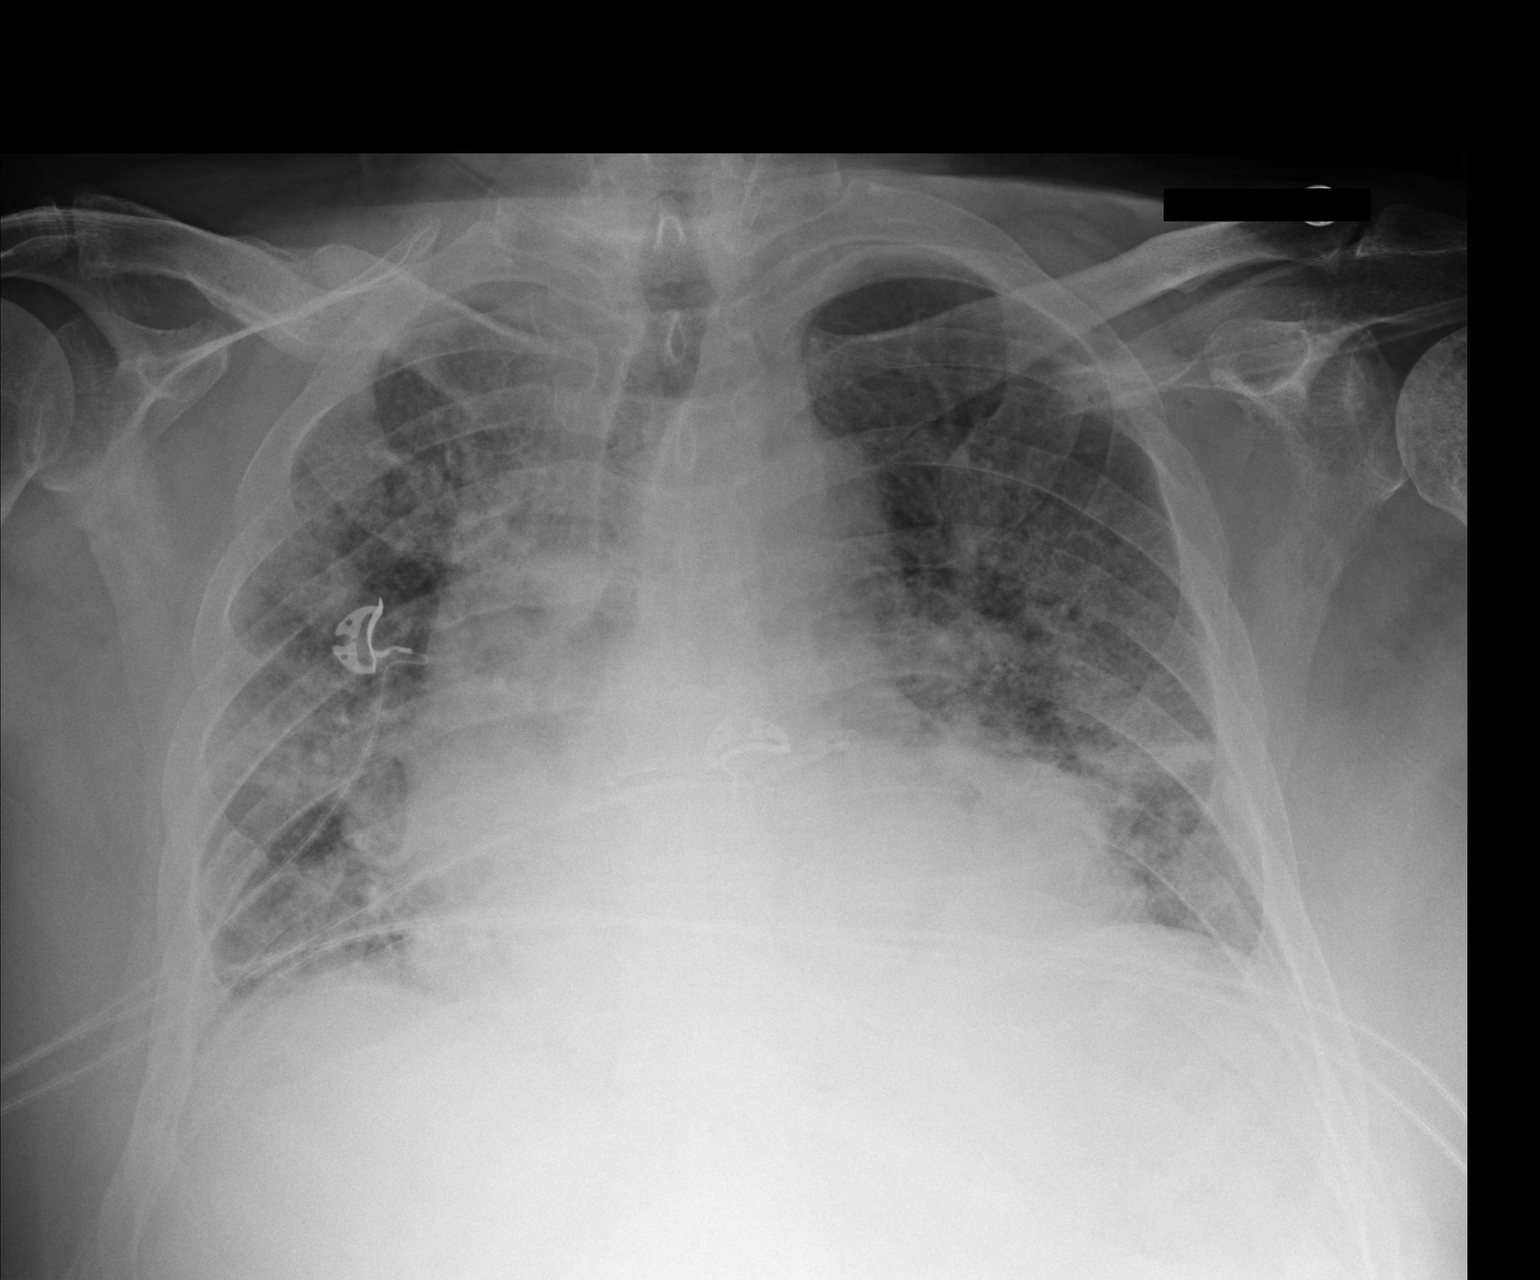

[1 of 1 positions shown; findings below may reference images not displayed]

FINDINGS: There remains interstitial and patchy alveolar edema. There is focal
airspace consolidation in the left lower lobe. There is cardiomegaly
with mild pulmonary venous hypertension. There is a a rather minimal
right pleural effusion. No adenopathy. No pneumothorax. There is an
old healed fracture of the right clavicle.
IMPRESSION: Findings felt to be most indicative of a degree of congestive heart
failure. Superimposed consolidation in the left base may represent
pneumonia or aspiration pneumonitis. Pneumonitis and congestive
heart failure may present concurrently. Cardiomegaly is stable. Old
healed fracture right clavicle. No evident pneumothorax.

## 2019-01-28 IMAGING — CR DG CHEST 1V PORT
1 series · 1 of 1 positions shown · non-contrast
Comparison: December 10, 2016

CLINICAL DATA: Pneumonia

EXAM:
PORTABLE CHEST 1 VIEW

[AP]
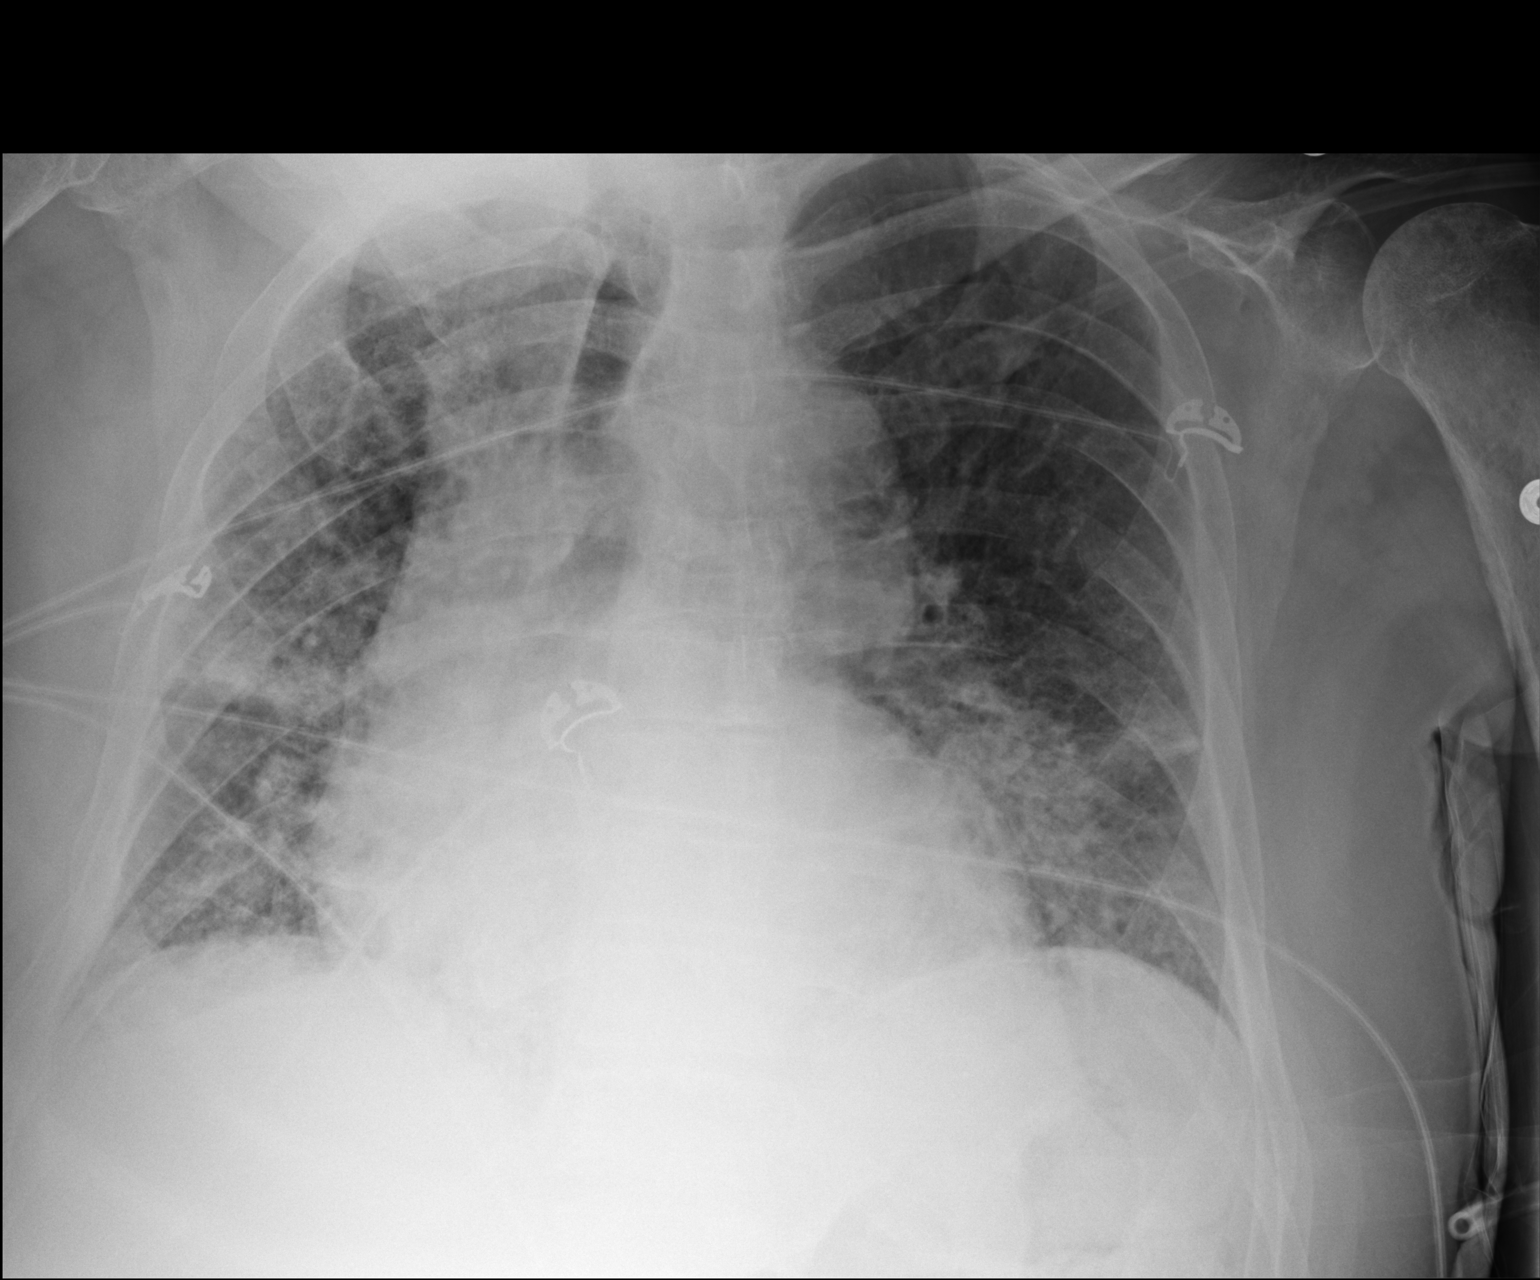

[1 of 1 positions shown; findings below may reference images not displayed]

FINDINGS: Interstitial and patchy alveolar edema are again noted. Focal
airspace consolidation in the left lower lobe is slightly less
apparent compared to 1 day prior. There is new atelectatic change in
the right mid lung. There is cardiomegaly. There is a mild degree of
pulmonary venous hypertension. There is equivocal right pleural
effusion. No adenopathy. No pneumothorax. Old healed fracture right
clavicle.
IMPRESSION: Persistent changes of congestive heart failure. Less airspace
consolidation left base compared to 1 day prior. The right midlung
atelectasis. Stable cardiac silhouette.

## 2019-02-02 IMAGING — CT CT HEAD W/O CM
4 series · 16 of 47 positions shown, 18 images · non-contrast
Comparison: Head CT dated 11/27/2016

CLINICAL DATA: 60-year-old male with fall.

EXAM:
CT HEAD WITHOUT CONTRAST
TECHNIQUE: Contiguous axial images were obtained from the base of the skull
through the vertex without intravenous contrast.

[Series 3: head bone · axial · 0.45mm/px · z∈[+1263,+1295]mm · 3 of 83 slices shown]
[im 9/83  bone]
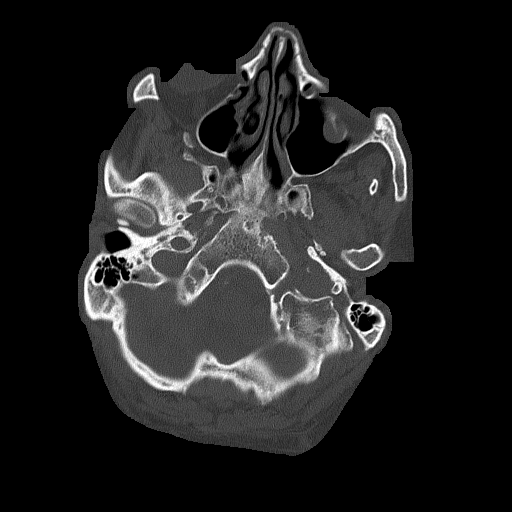
[im 17/83  bone]
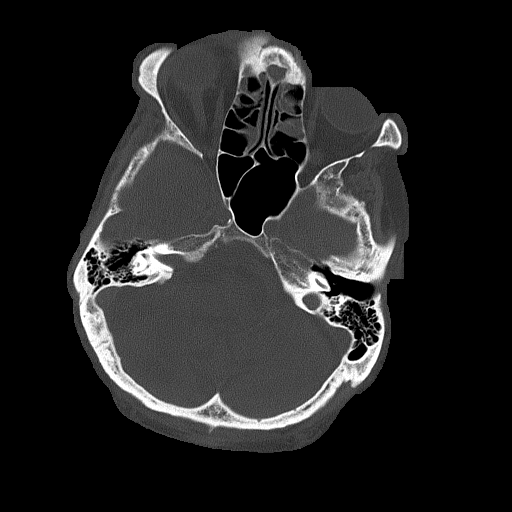
[im 25/83  bone]
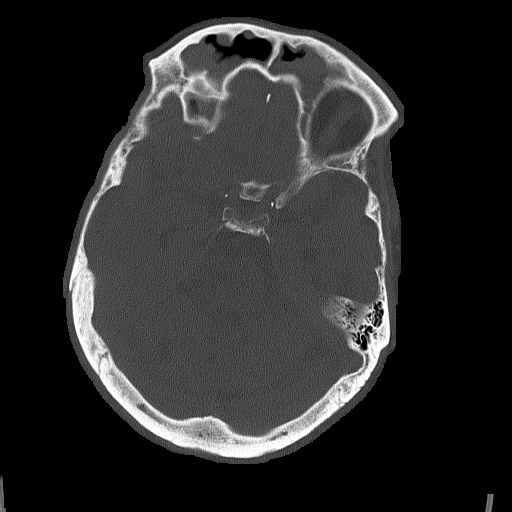

[Series 4: head without · axial · non-contrast · 0.45mm/px · z∈[+1267,+1387]mm · 7 of 34 slices shown, 9 images]
[im 5/34  brain]
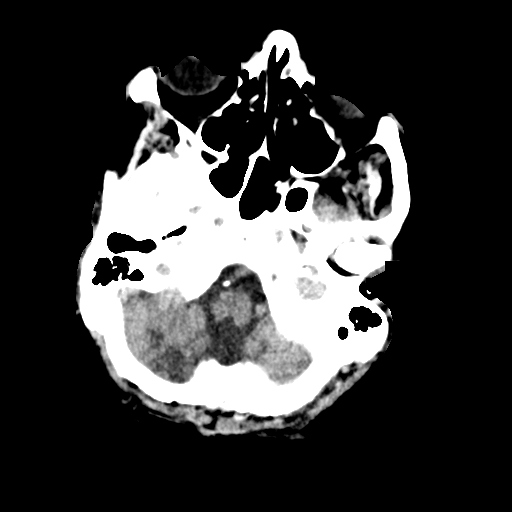
[im 5/34  bone]
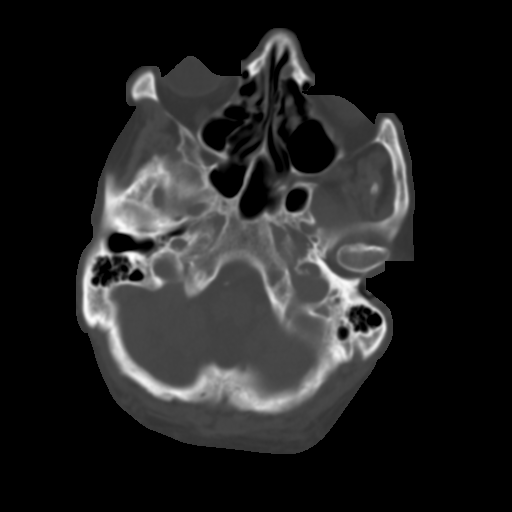
[im 9/34  brain]
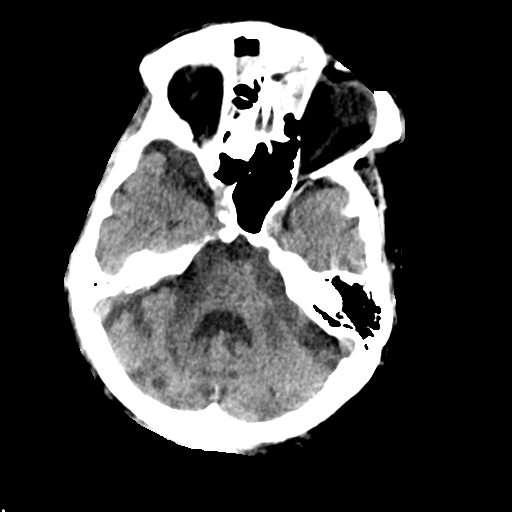
[im 13/34  brain]
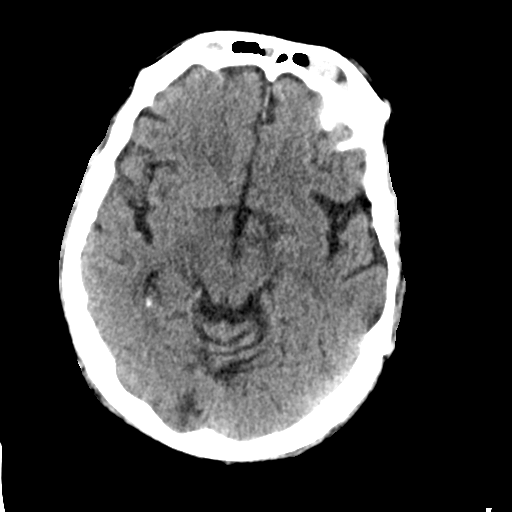
[im 17/34  brain]
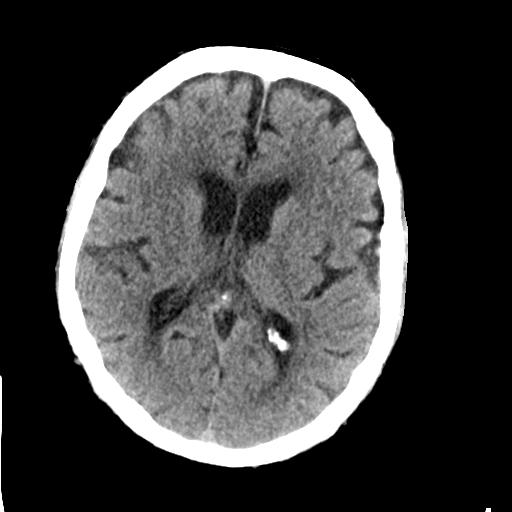
[im 21/34  brain]
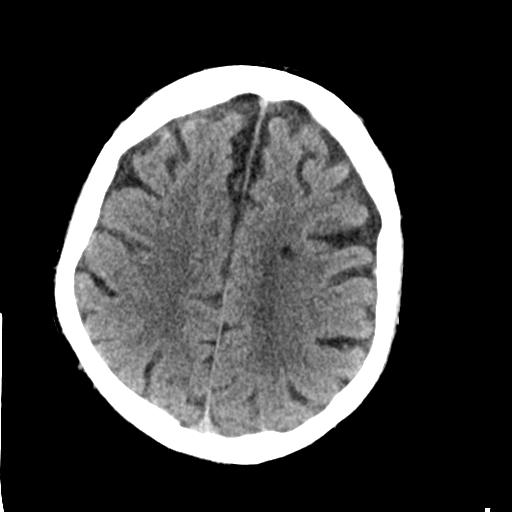
[im 21/34  bone]
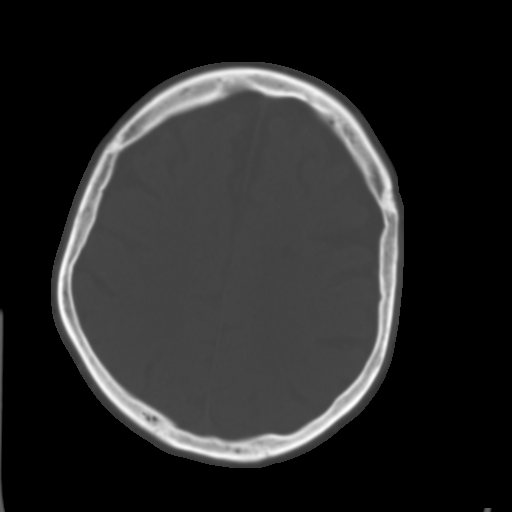
[im 25/34  brain]
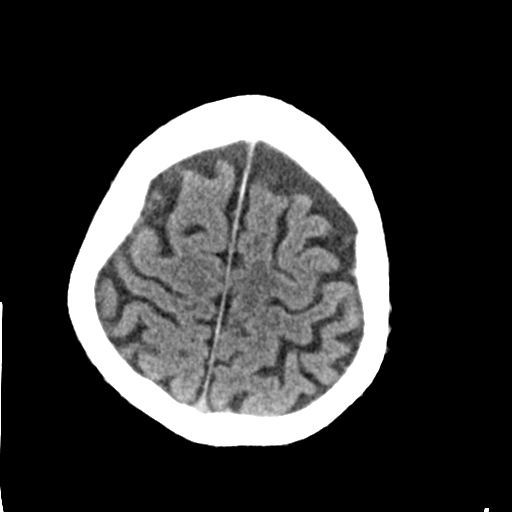
[im 29/34  brain]
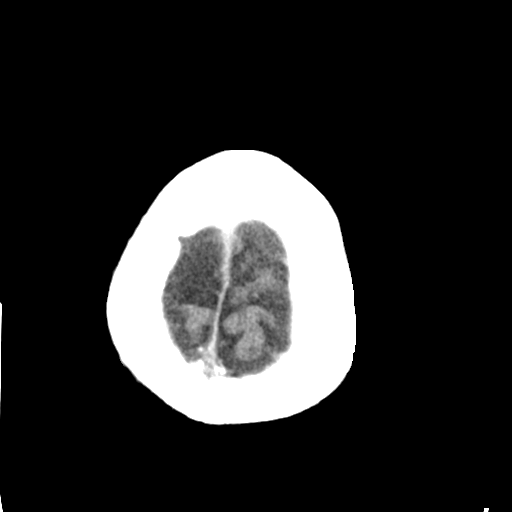

[Series 5: head without cor · coronal · non-contrast · 0.34mm/px · 3 of 71 slices shown]
[im 24/71  brain]
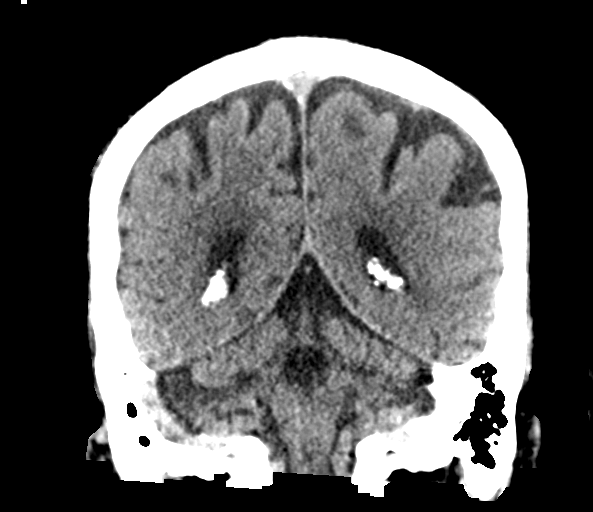
[im 32/71  brain]
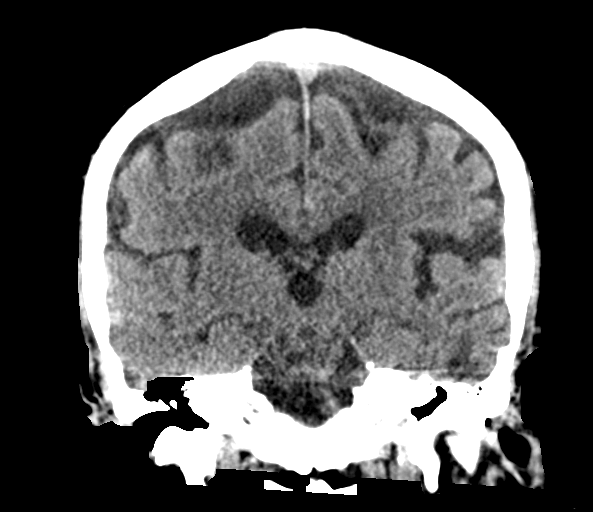
[im 39/71  brain]
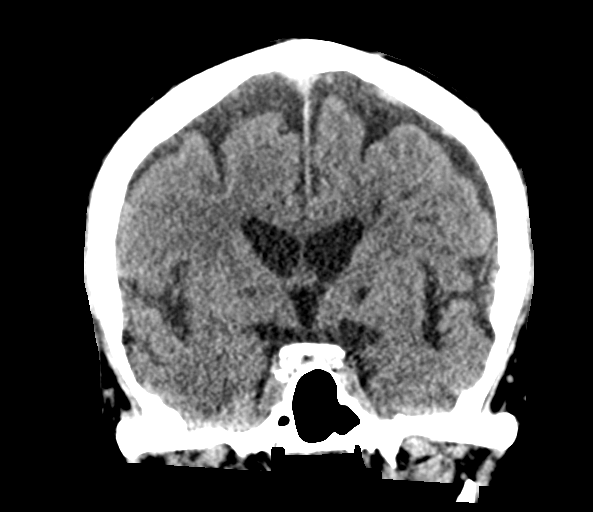

[Series 6: head without sag · sagittal · non-contrast · 0.32mm/px · 3 of 67 slices shown]
[im 23/67  brain]
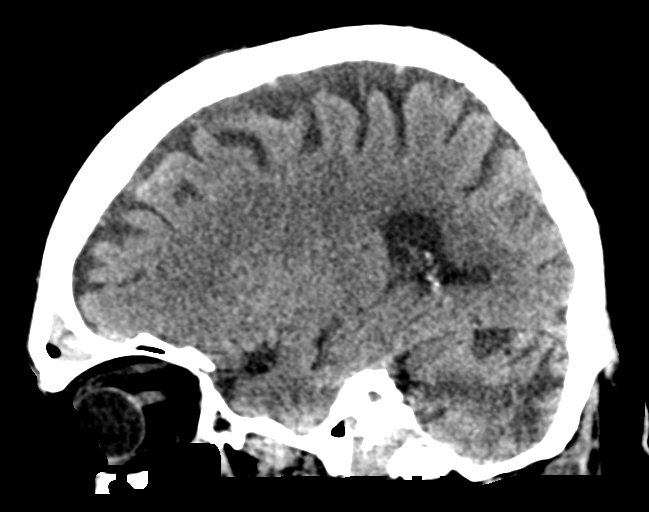
[im 34/67  brain]
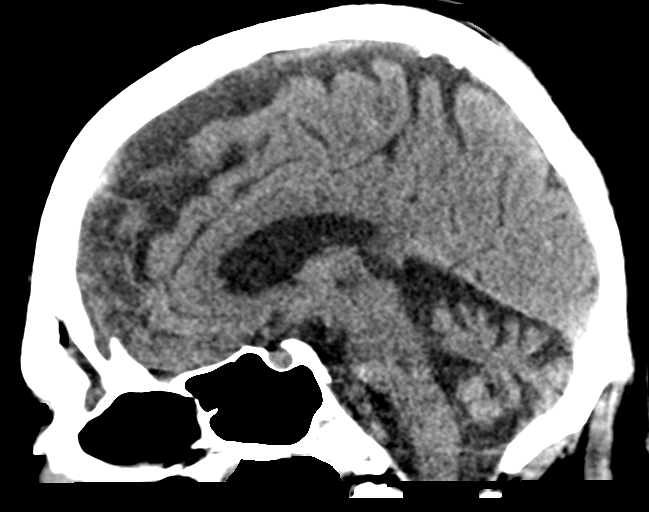
[im 45/67  brain]
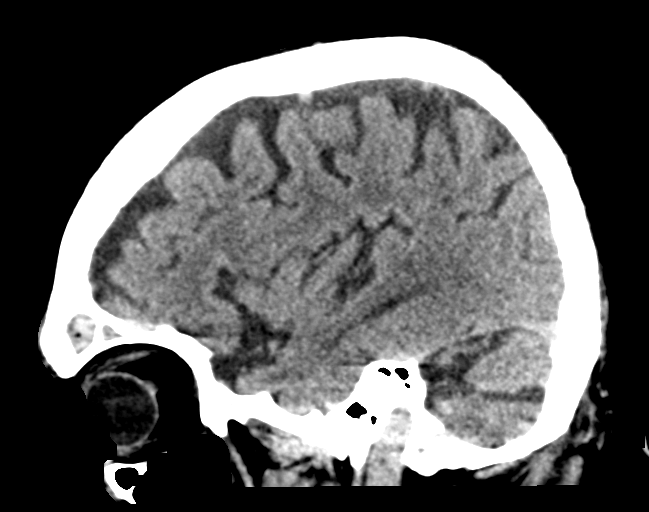

[16 of 47 positions shown; findings below may reference images not displayed]

FINDINGS: Brain: There is moderate age-related atrophy and chronic
microvascular ischemic changes. Small low attenuating foci in the
left centrum semiovale and left internal capsule likely old lacunar
infarcts. Cerebellar atrophy with multiple old cerebellar
hemispheric infarcts, right there are left. There is no acute
intracranial hemorrhage. No mass effect or midline shift noted. No
extra-axial fluid collection.

Vascular: No hyperdense vessel or unexpected calcification.

Skull: Normal. Negative for fracture or focal lesion.

Sinuses/Orbits: There is diffuse mucoperiosteal thickening of
paranasal sinuses primarily involving the frontal sinuses and
ethmoid air cells. Bilateral maxillary sinus retention cysts and
polyps noted. No air-fluid level. The mastoid air cells are clear.
The globes are intact. Retro-orbital fat are preserved.

Other: None
IMPRESSION: 1. No acute intracranial hemorrhage.
2. Moderate age-related atrophy and chronic microvascular ischemic
changes. Old bilateral cerebellar infarcts, right greater than left.
3. Paranasal sinus disease.

## 2019-02-02 IMAGING — CR DG HIP (WITH OR WITHOUT PELVIS) 2-3V*L*
3 series · 4 of 4 positions shown · non-contrast
Comparison: None.

CLINICAL DATA: 60-year-old male with left hip fracture.

EXAM:
DG HIP (WITH OR WITHOUT PELVIS) 2-3V LEFT

[pelvis ap]
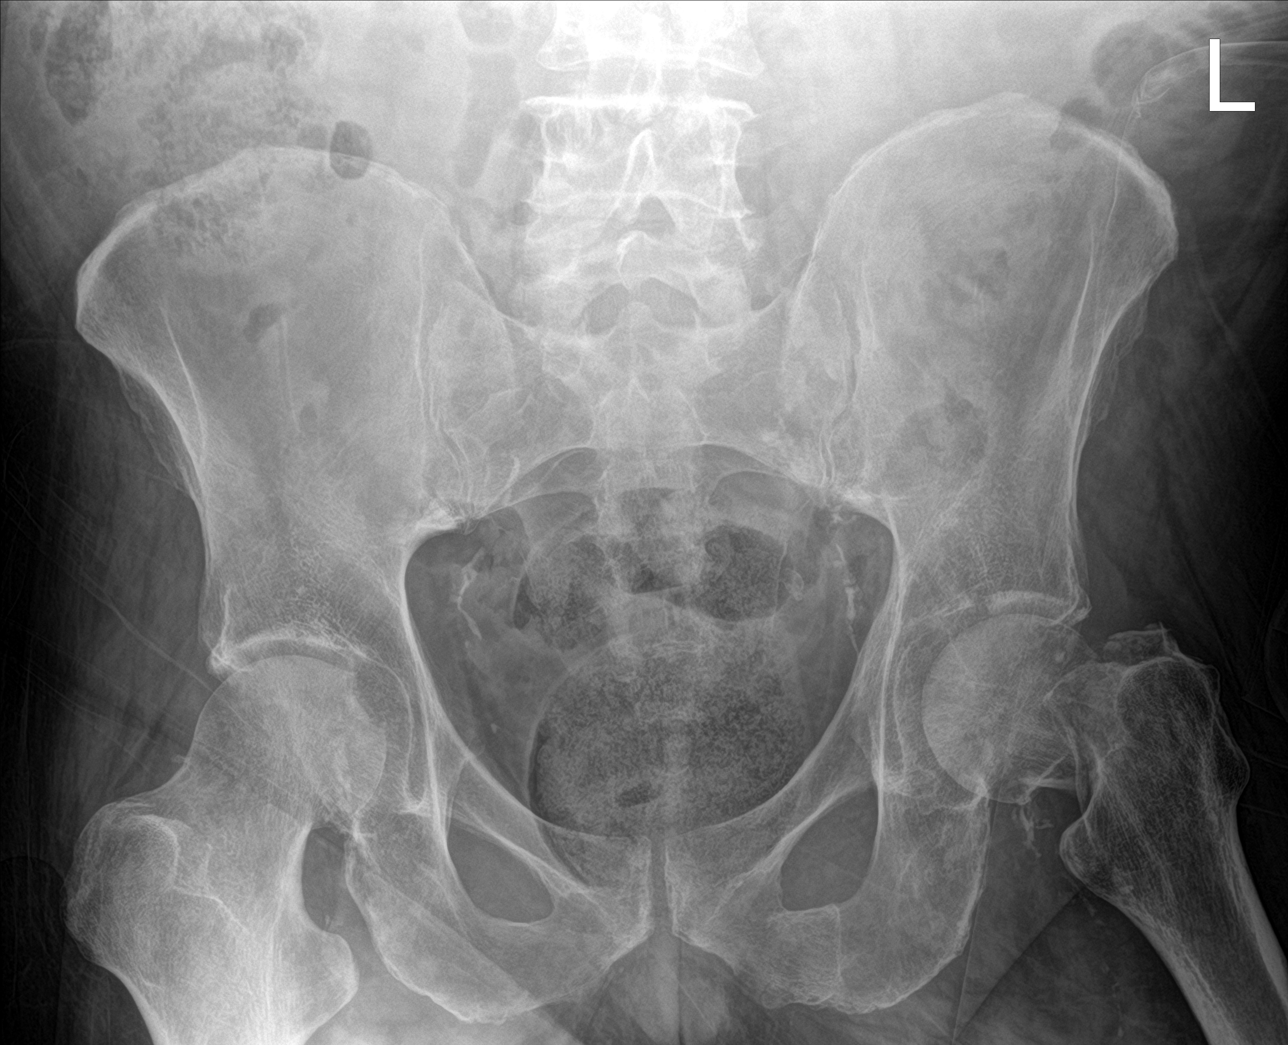

[hip ap]
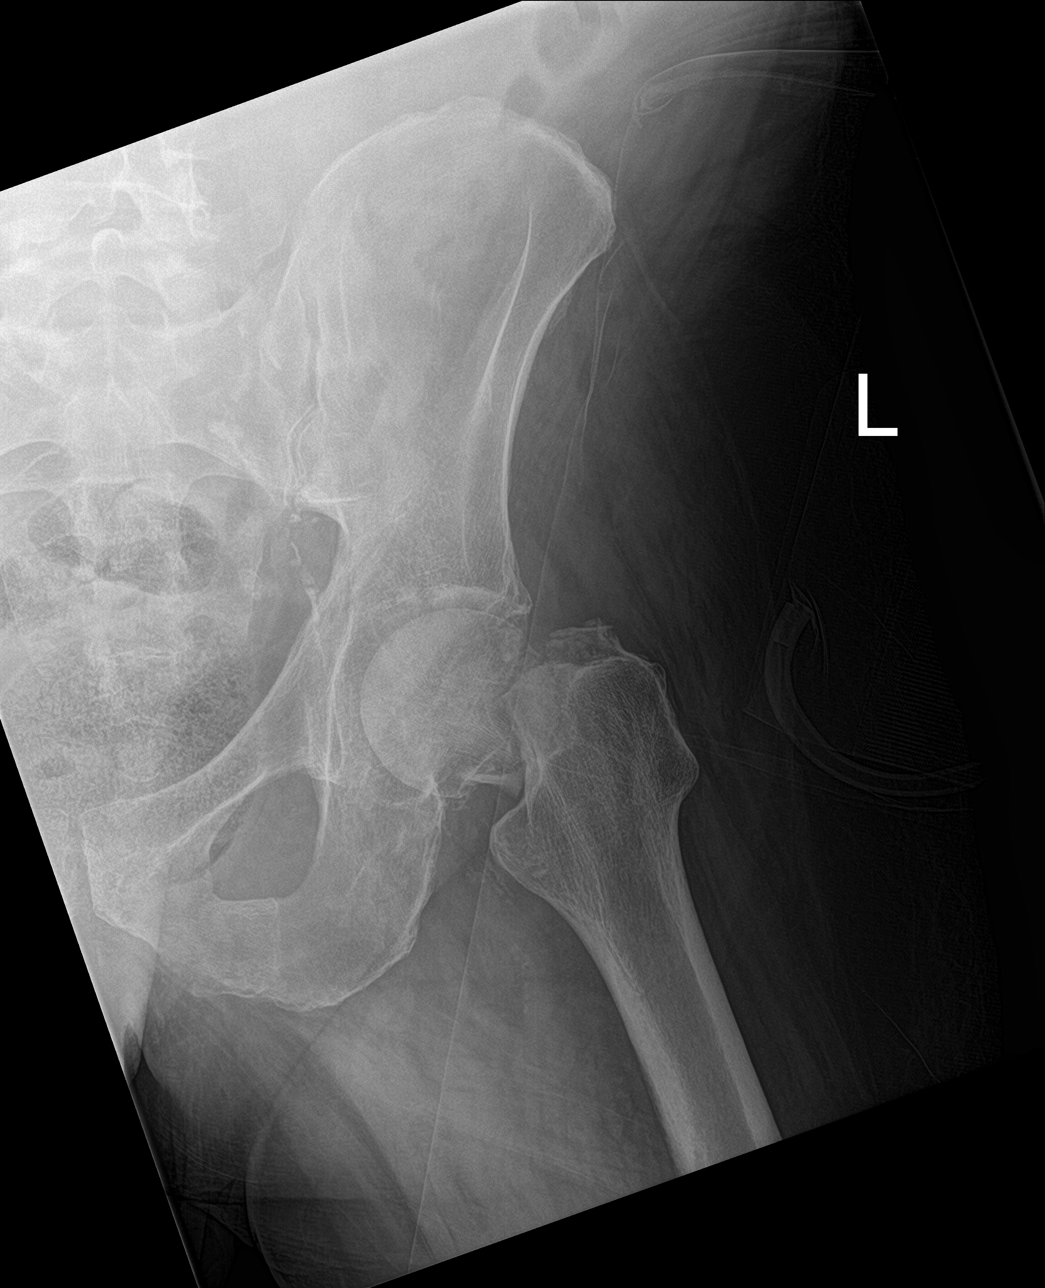

[Series 4: hip x-table · 0.14mm/px · 2 of 2 slices shown]
[im 1/2]
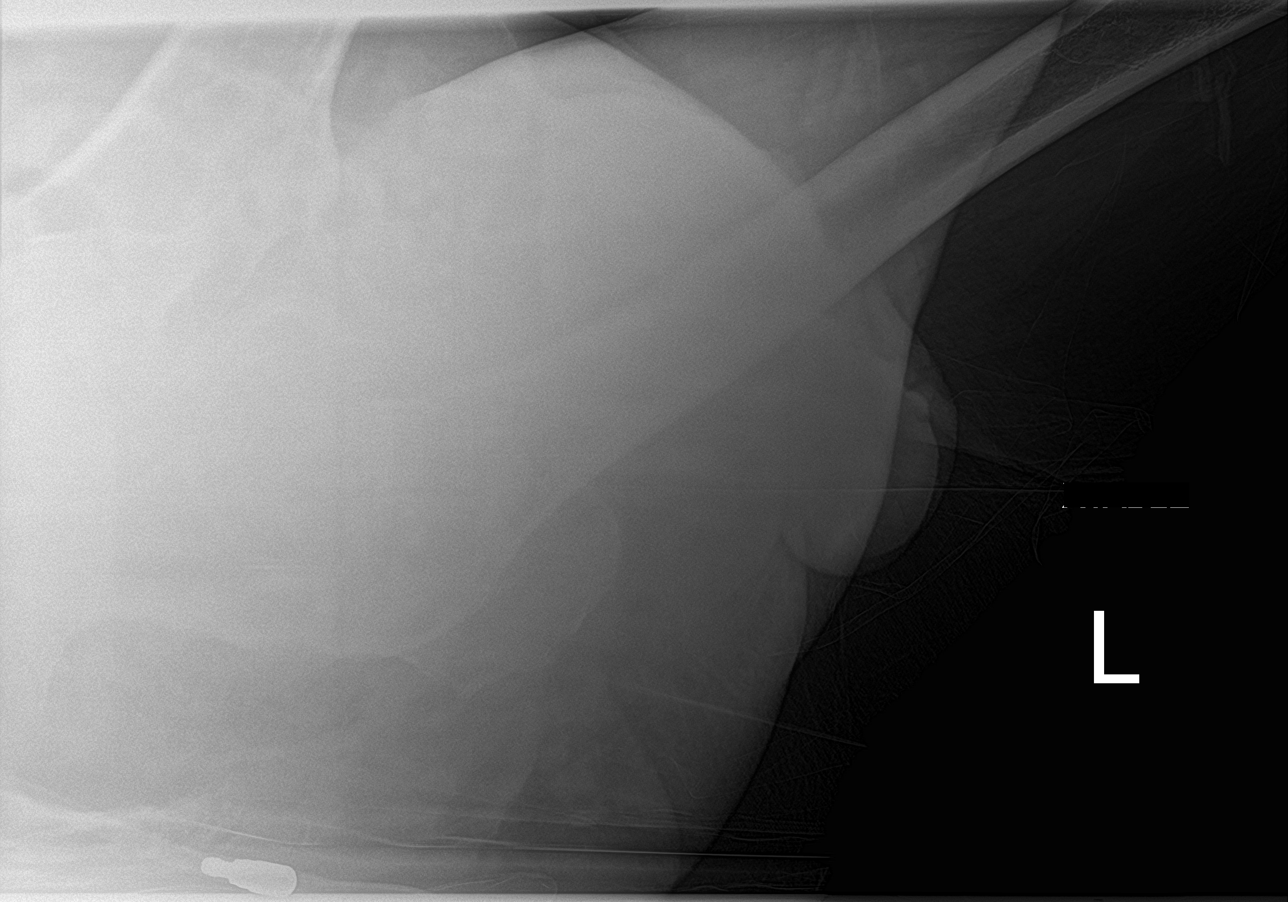
[im 2/2]
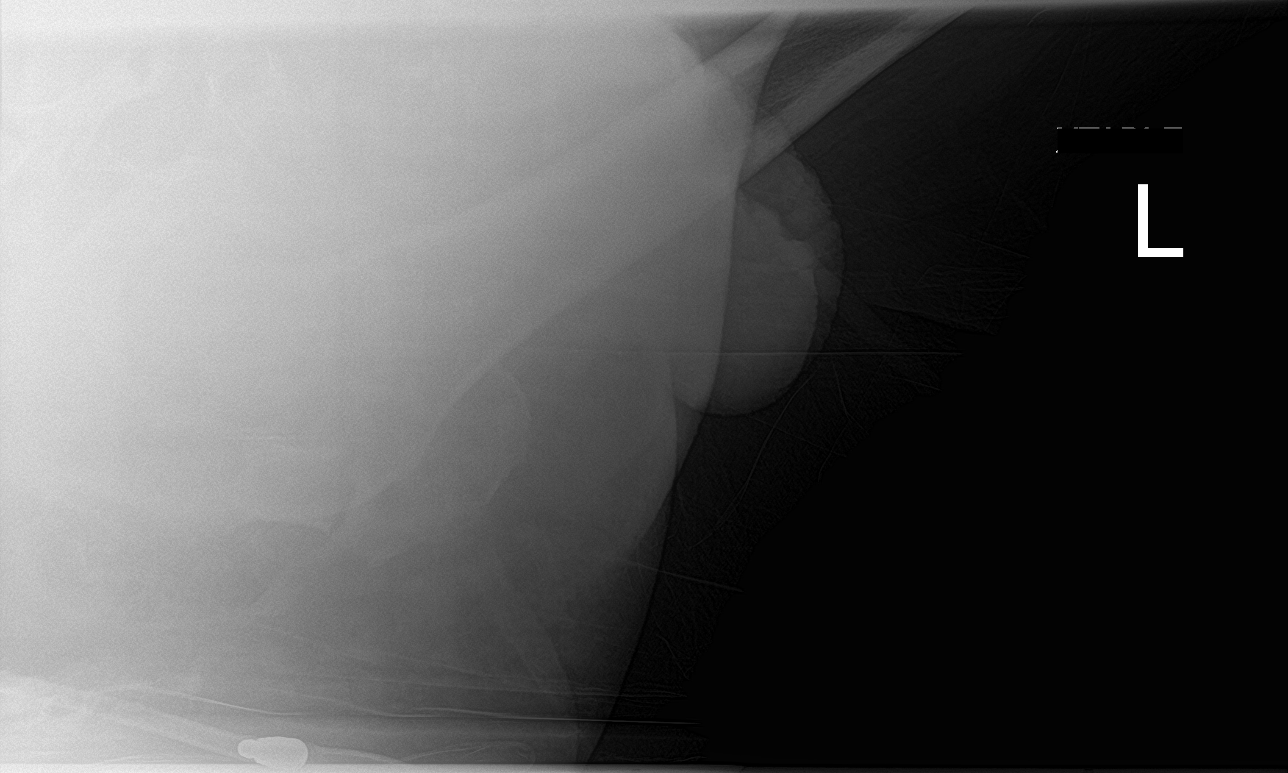

[4 of 4 positions shown; findings below may reference images not displayed]

FINDINGS: There is a mildly displaced fracture of the left femoral neck with
mild proximal migration of the femoral shaft in relation to the
femoral head. The femoral head remains in anatomic alignment with
the acetabulum. No other acute fracture identified. There is no
dislocation. The bones are osteopenic. The soft tissues appear
unremarkable.
IMPRESSION: Mildly displaced left femoral neck fracture.

## 2019-02-07 IMAGING — CR DG CHEST 1V PORT
1 series · 1 of 1 positions shown · non-contrast
Comparison: 12/14/2016

CLINICAL DATA: Respiratory failure

EXAM:
PORTABLE CHEST 1 VIEW

[AP]
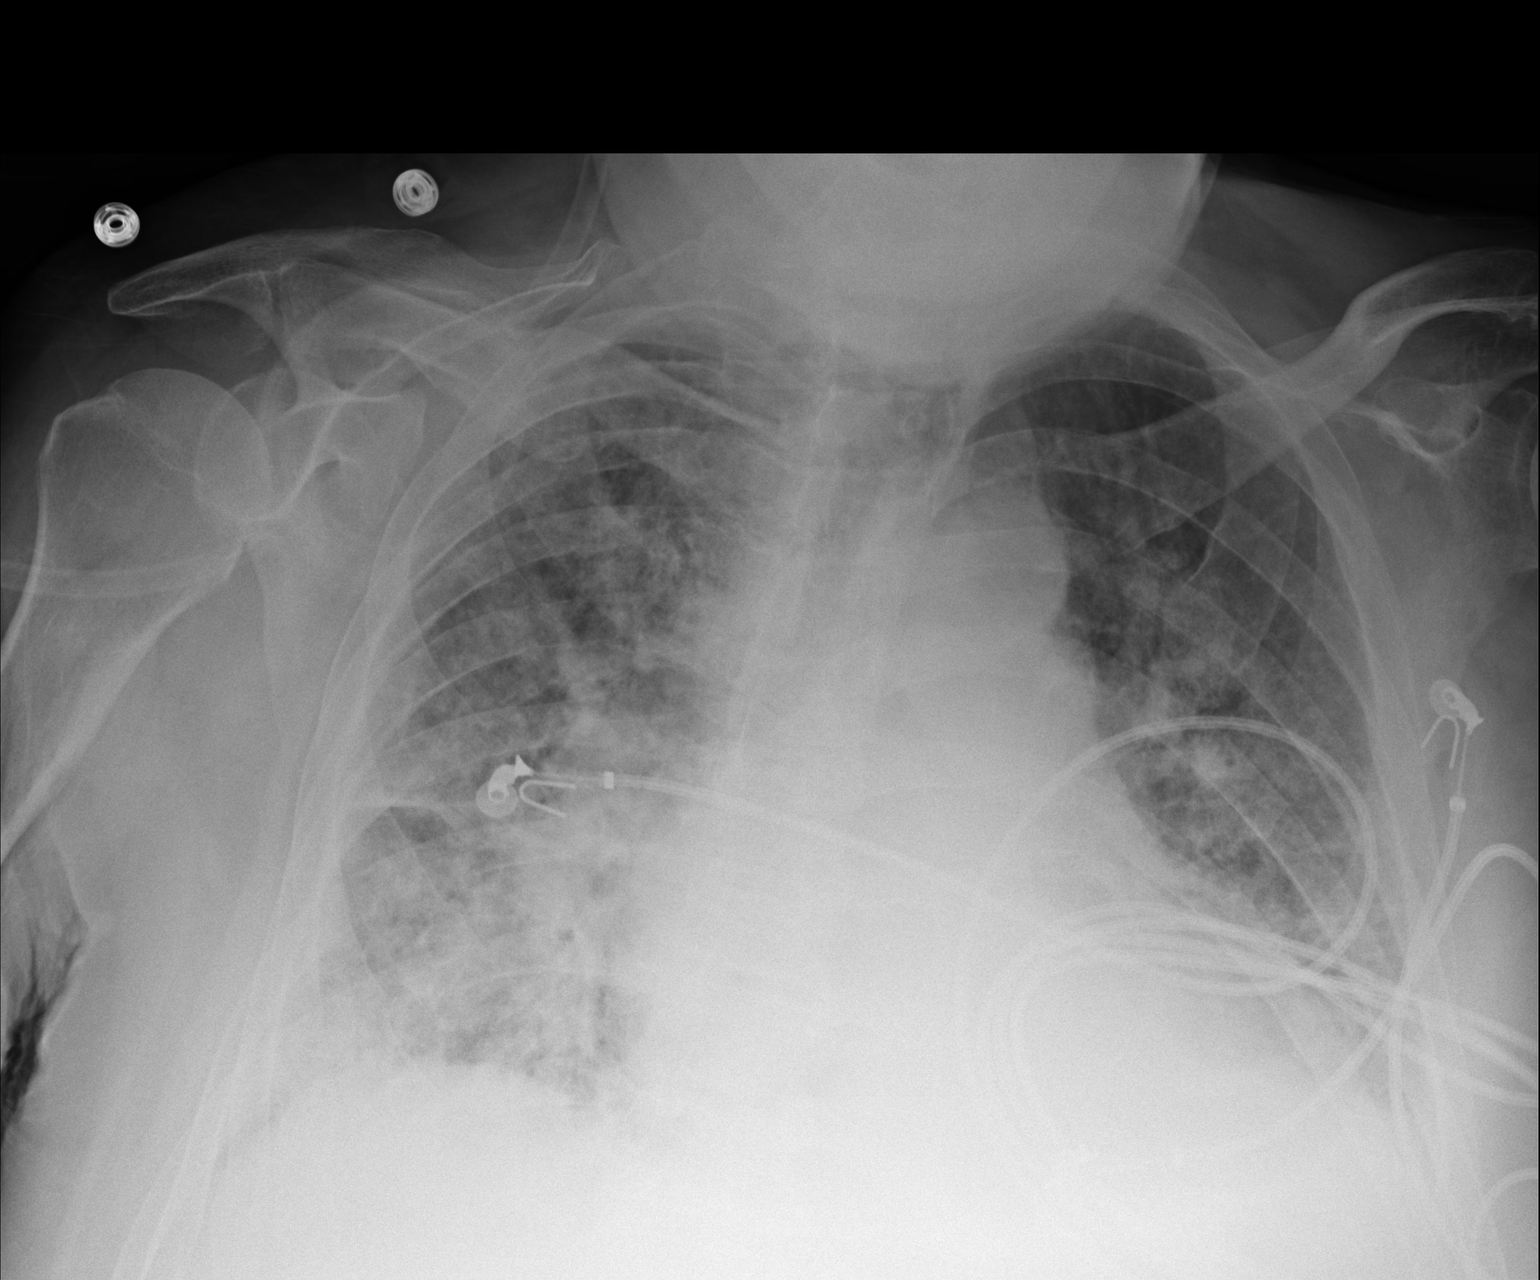

[1 of 1 positions shown; findings below may reference images not displayed]

FINDINGS: Cardiopericardial enlargement. Diffuse, bilateral interstitial and
airspace opacity. Stable mediastinal contours when allowing for
differences in rotation. No effusion or noted pneumothorax.

Remote right clavicle fracture.  No acute osseous finding.

Artifact from EKG leads.
IMPRESSION: 1. Extensive lung opacity which could reflect edema or multifocal
pneumonia.
2. Chronic cardiomegaly.

## 2019-02-07 IMAGING — DX DG CHEST 1V PORT
1 series · 2 of 2 positions shown · non-contrast
Comparison: 12/21/2016

CLINICAL DATA: PICC line placement

EXAM:
PORTABLE CHEST 1 VIEW

[Series 2: chest ap · 0.14mm/px · 2 of 2 slices shown]
[im 1/2]
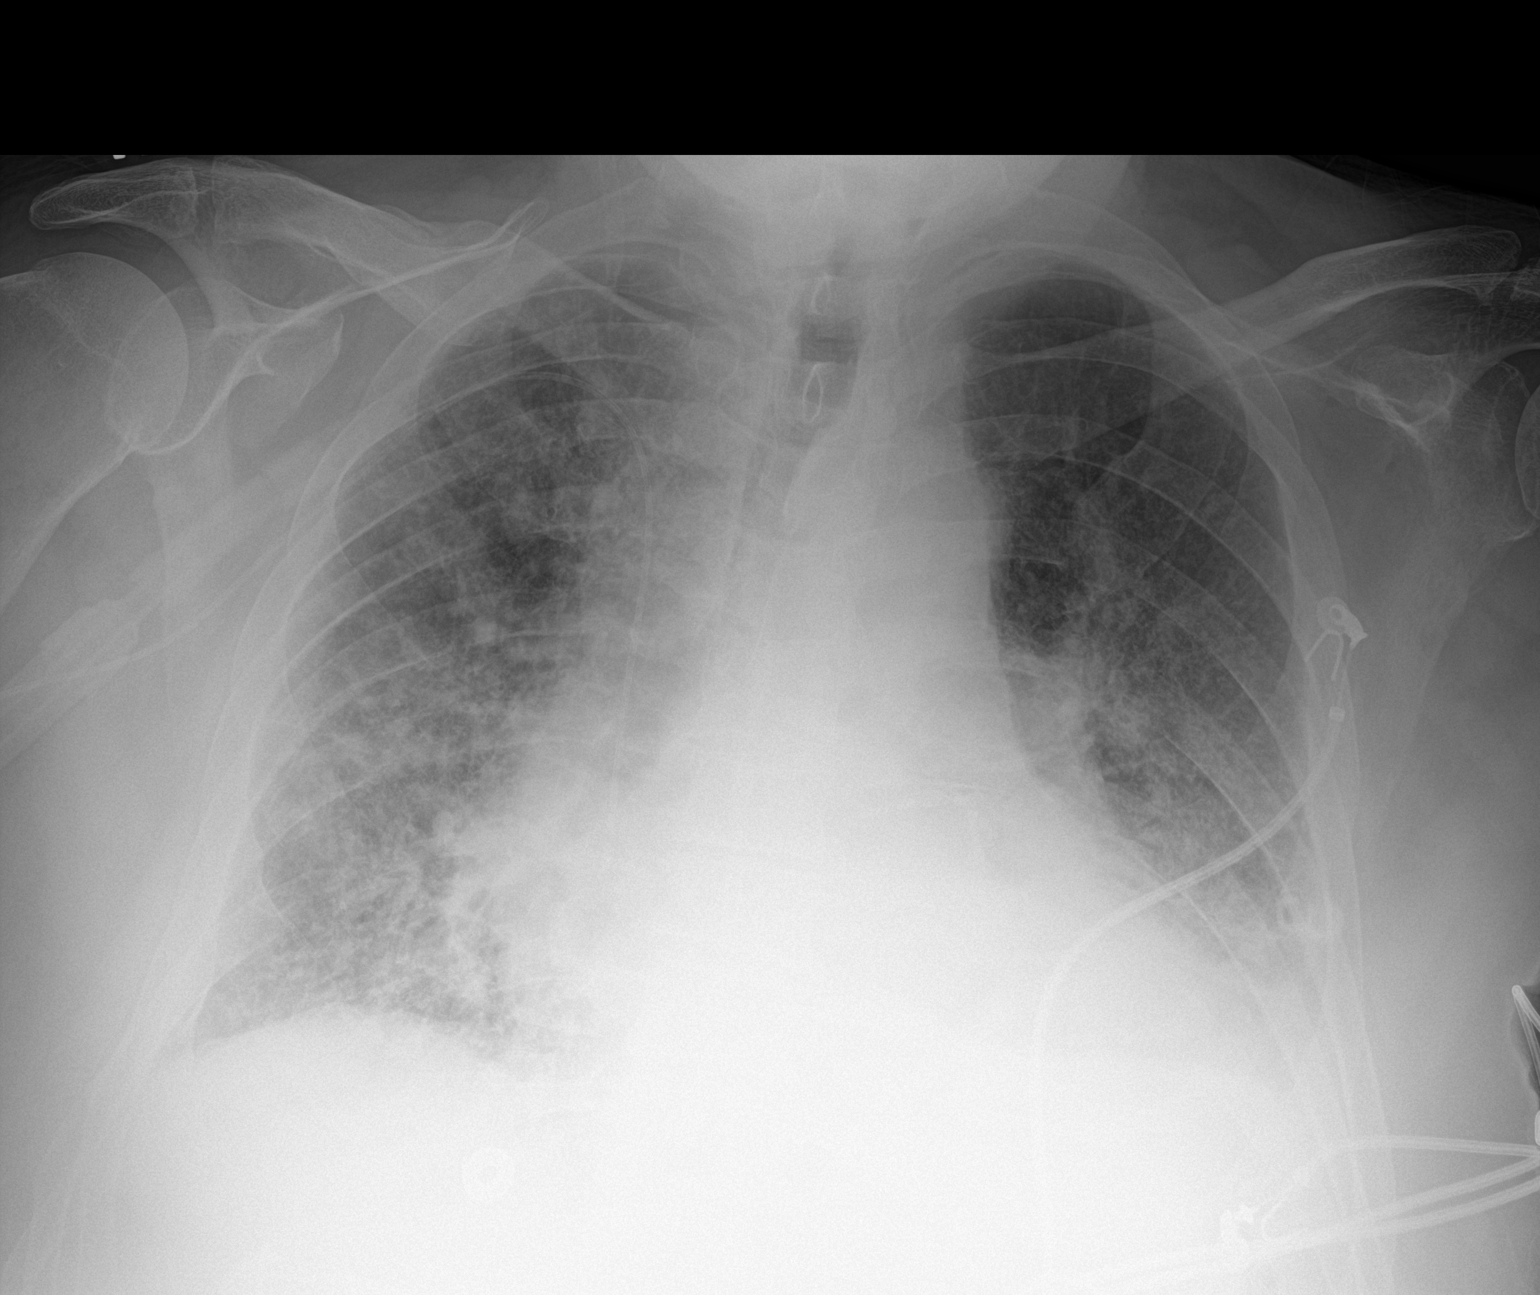
[im 2/2]
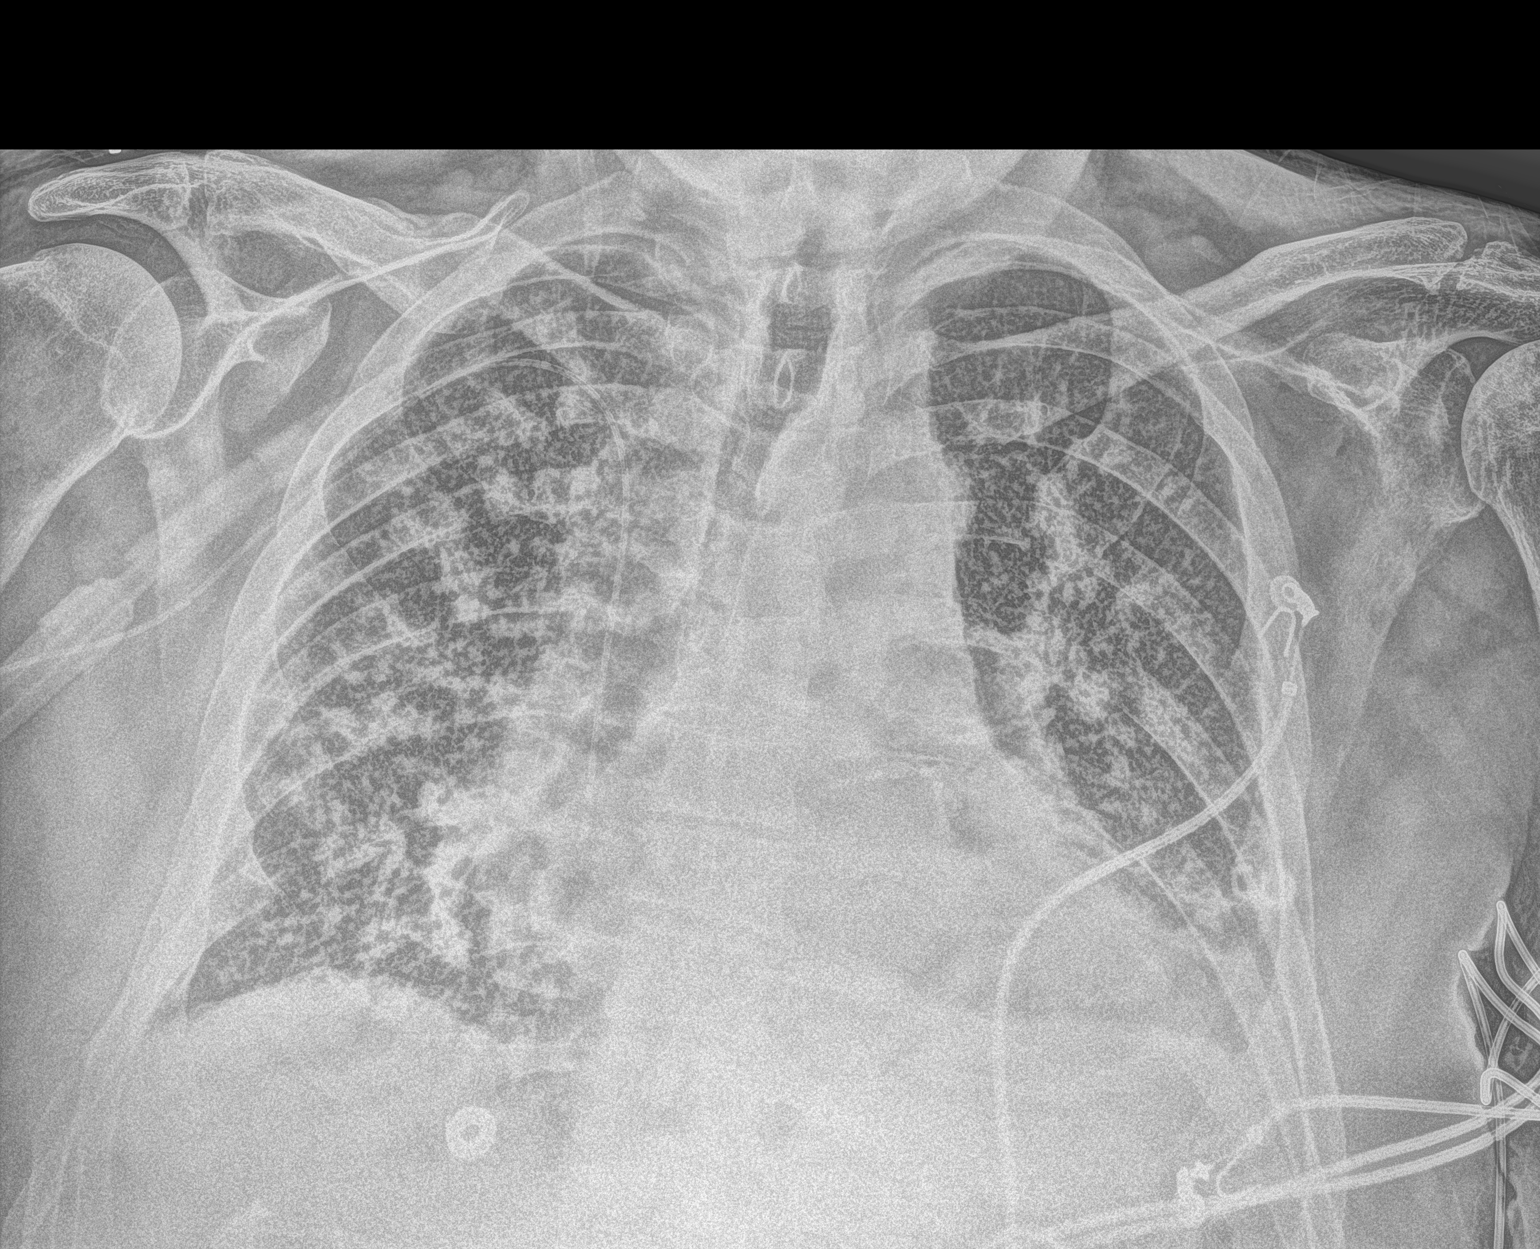

[2 of 2 positions shown; findings below may reference images not displayed]

FINDINGS: 5333 hours. Right PICC line tip is positioned at the SVC/ RA
junction. The cardio pericardial silhouette is enlarged. Asymmetric
bilateral airspace disease, right greater than left persists with
slight improvement in aeration at the right lung base. The
visualized bony structures of the thorax are intact. Telemetry leads
overlie the chest.
IMPRESSION: Right PICC line tip projects at the SVC/ RA junction, potentially
just into the upper right atrium.

Cardiomegaly with asymmetric airspace disease right greater than
left. Asymmetric pulmonary edema or diffuse infection could have
this appearance.

## 2019-02-09 IMAGING — CT CT CHEST HIGH RESOLUTION W/O CM
2 of 5 series · 14 of 36 positions shown, 17 images · non-contrast
Comparison: CT the chest, abdomen and pelvis 11/21/2014.

CLINICAL DATA: 60-year-old male with history of pneumonia. Followup
study.

EXAM:
CT CHEST WITHOUT CONTRAST
TECHNIQUE: Multidetector CT imaging of the chest was performed following the
standard protocol without intravenous contrast. High resolution
imaging of the lungs, as well as inspiratory and expiratory imaging,
was performed.

[Series 4: chest w/o 5mm st · axial · non-contrast · 0.76mm/px · z∈[-287,-7]mm · 11 of 156 slices shown, 14 images]
[im 8/156  mediastinal]
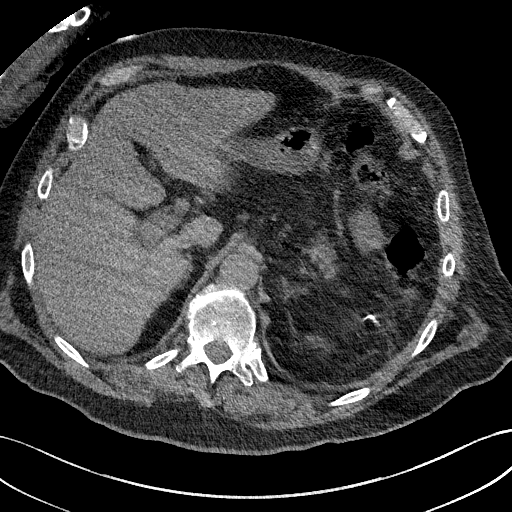
[im 8/156  lung]
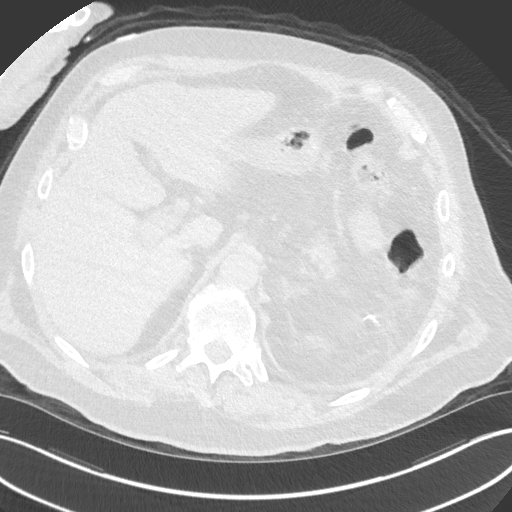
[im 23/156  lung]
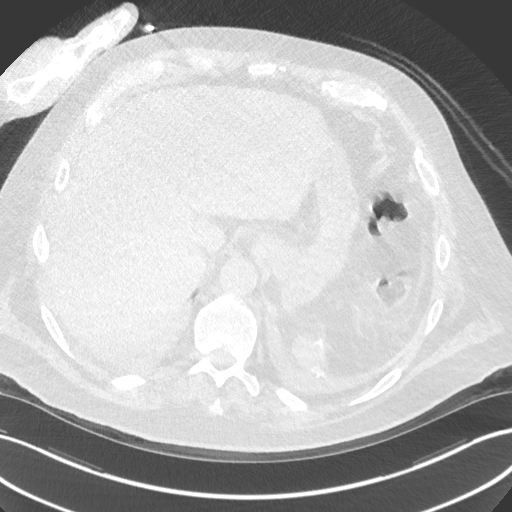
[im 37/156  lung]
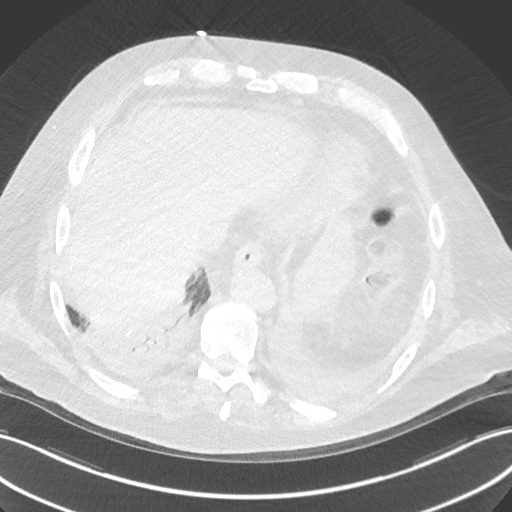
[im 52/156  lung]
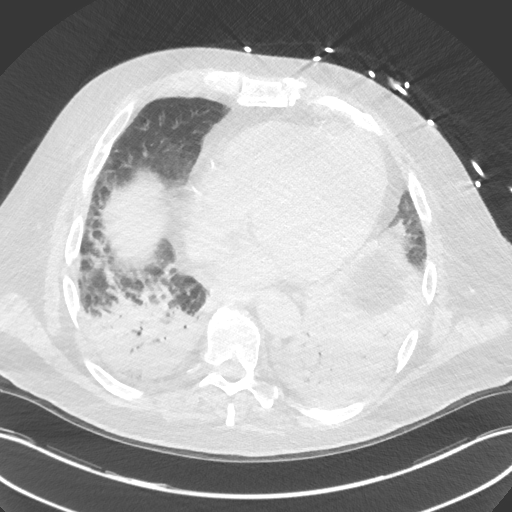
[im 67/156  mediastinal]
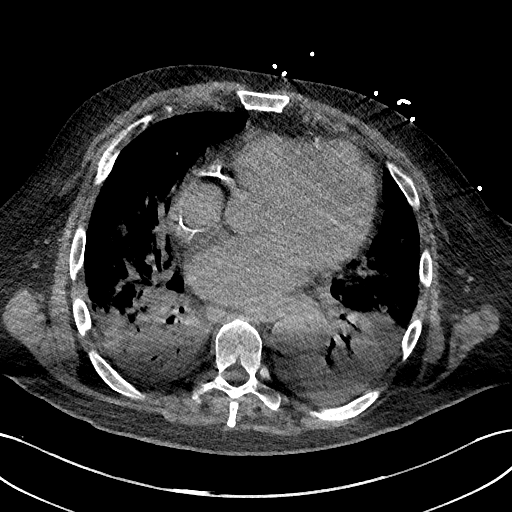
[im 67/156  lung]
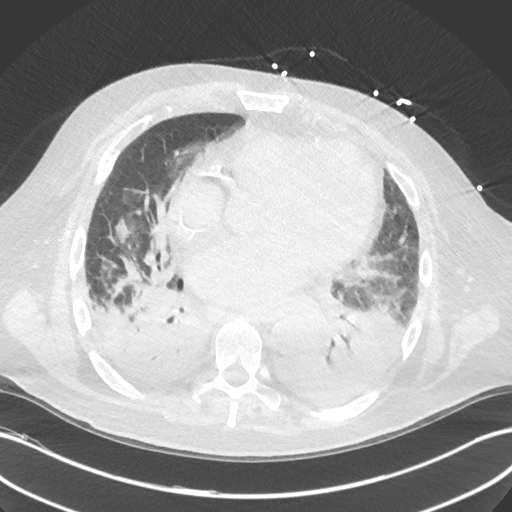
[im 82/156  lung]
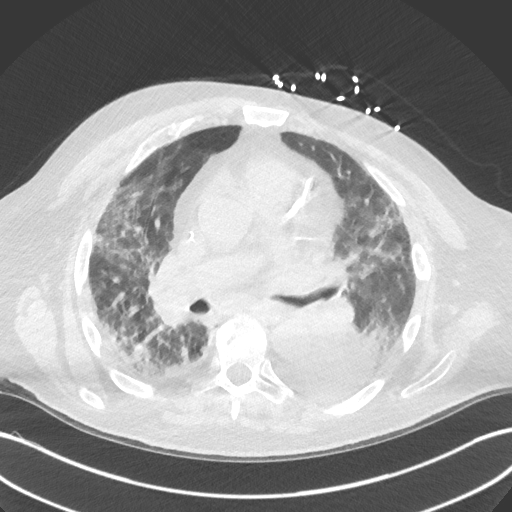
[im 89/156  lung]
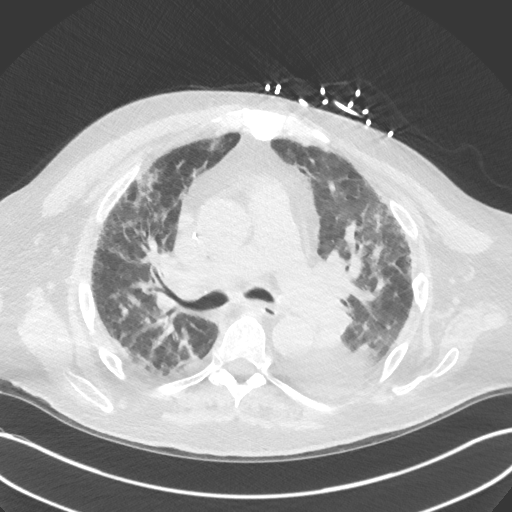
[im 104/156  lung]
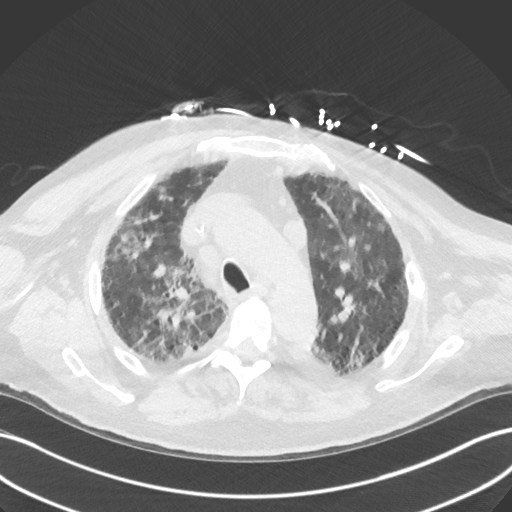
[im 119/156  mediastinal]
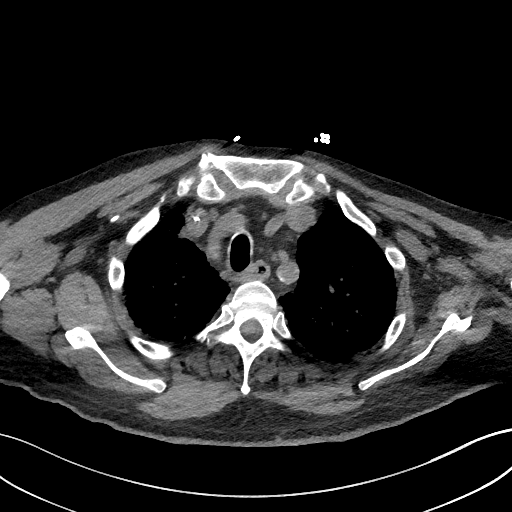
[im 119/156  lung]
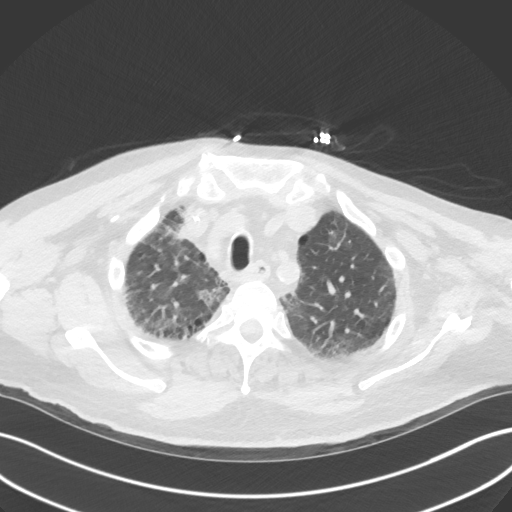
[im 133/156  lung]
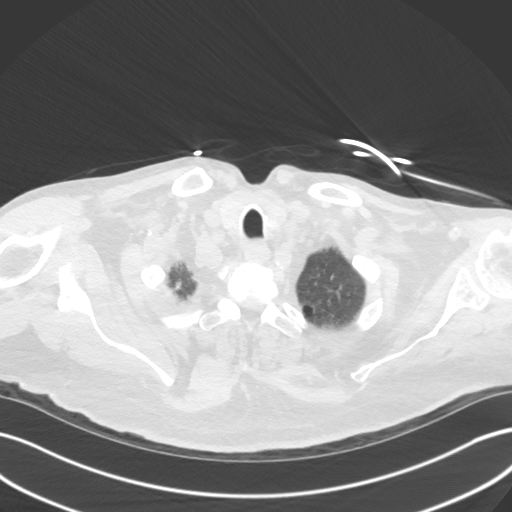
[im 148/156  lung]
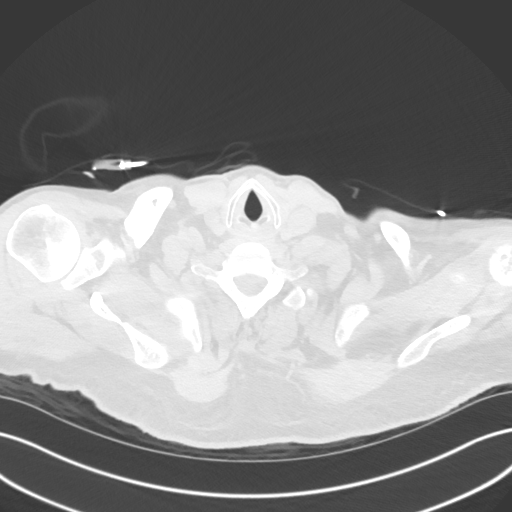

[Series 6: chest w/o 3mm st cor · coronal · non-contrast · 0.61mm/px · 3 of 116 slices shown]
[im 24/116  lung]
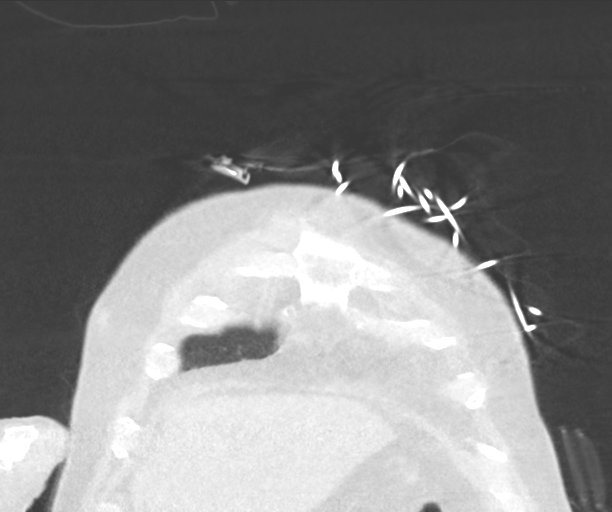
[im 47/116  lung]
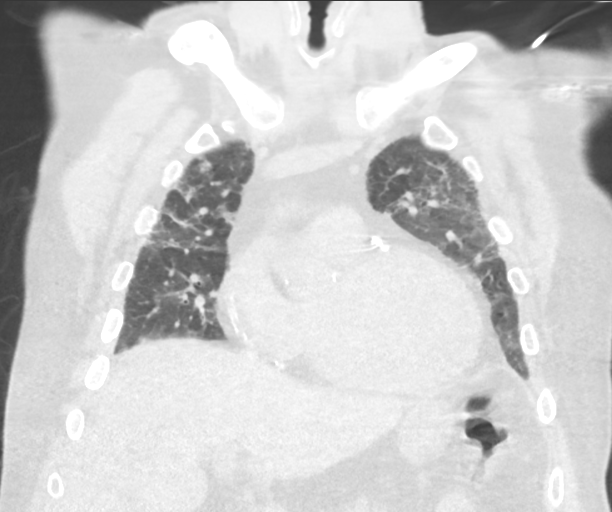
[im 70/116  lung]
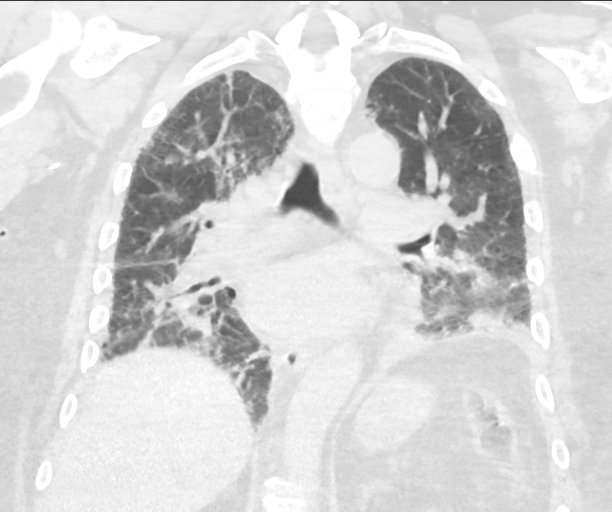

[14 of 36 positions shown; findings below may reference images not displayed]

FINDINGS: Cardiovascular: Heart size is mildly enlarged. There is no
significant pericardial fluid, thickening or pericardial
calcification. There is aortic atherosclerosis, as well as
atherosclerosis of the great vessels of the mediastinum and the
coronary arteries, including calcified atherosclerotic plaque in the
left main, left anterior descending, left circumflex and right
coronary arteries. Curvilinear calcifications in the apex of the
left ventricle which appears mildly rounded, suggesting the presence
of chronic left ventricular apical thrombus. Right upper extremity
PICC with tip terminating in the right atrium.

Mediastinum/Nodes: Numerous enlarged mediastinal and bilateral hilar
lymph nodes measuring up to 17 mm in short axis in the high right
paratracheal nodal station and 16 mm in short axis in the
prevascular nodal station. Esophagus is unremarkable in appearance.
No axillary lymphadenopathy.

Lungs/Pleura: There is a combination of atelectasis and airspace
consolidation in the lower lobes of the lungs bilaterally. Patchy
multifocal ground-glass attenuation and septal thickening noted
through other portions of the aerated lungs bilaterally. Diffuse
bronchial wall thickening with mild paraseptal emphysema. Trace
bilateral pleural effusions (left greater than right) lying
dependently. Inspiratory and expiratory phase imaging is
unremarkable.

Upper Abdomen: Status post splenectomy. Small splenule in the left
upper quadrant.

Musculoskeletal: There are no aggressive appearing lytic or blastic
lesions noted in the visualized portions of the skeleton. Old healed
fracture of the right midclavicle. Multiple old healed bilateral rib
fractures.
IMPRESSION: 1. Extensive areas of consolidation and atelectasis in the lower
lobes of the lungs bilaterally. The dependent nature of these
findings suggest sequela of aspiration with associated aspiration
pneumonia.
2. In the better aerated regions of the lungs there are findings
suggestive of a background of mild pulmonary edema.
3. Cardiomegaly with mild rounding of the left ventricular apex,
likely related to a mild left ventricular apical aneurysm. There are
some calcifications in the apex of the left ventricle which strongly
suggests the presence of chronic left ventricular apical thrombus.
This could be further evaluated with echocardiography.
4. Aortic atherosclerosis, in addition to left main and 3 vessel
coronary artery disease. Please note that although the presence of
coronary artery calcium documents the presence of coronary artery
disease, the severity of this disease and any potential stenosis
cannot be assessed on this non-gated CT examination. Assessment for
potential risk factor modification, dietary therapy or pharmacologic
therapy may be warranted, if clinically indicated.
5. Trace bilateral pleural effusions lying dependently.
6. Status post splenectomy.

## 2019-02-09 IMAGING — DX DG ABD PORTABLE 1V
1 series · 1 of 1 positions shown · non-contrast
Comparison: None.

CLINICAL DATA: Nasogastric tube placement

EXAM:
PORTABLE ABDOMEN - 1 VIEW

[abdomen kub]
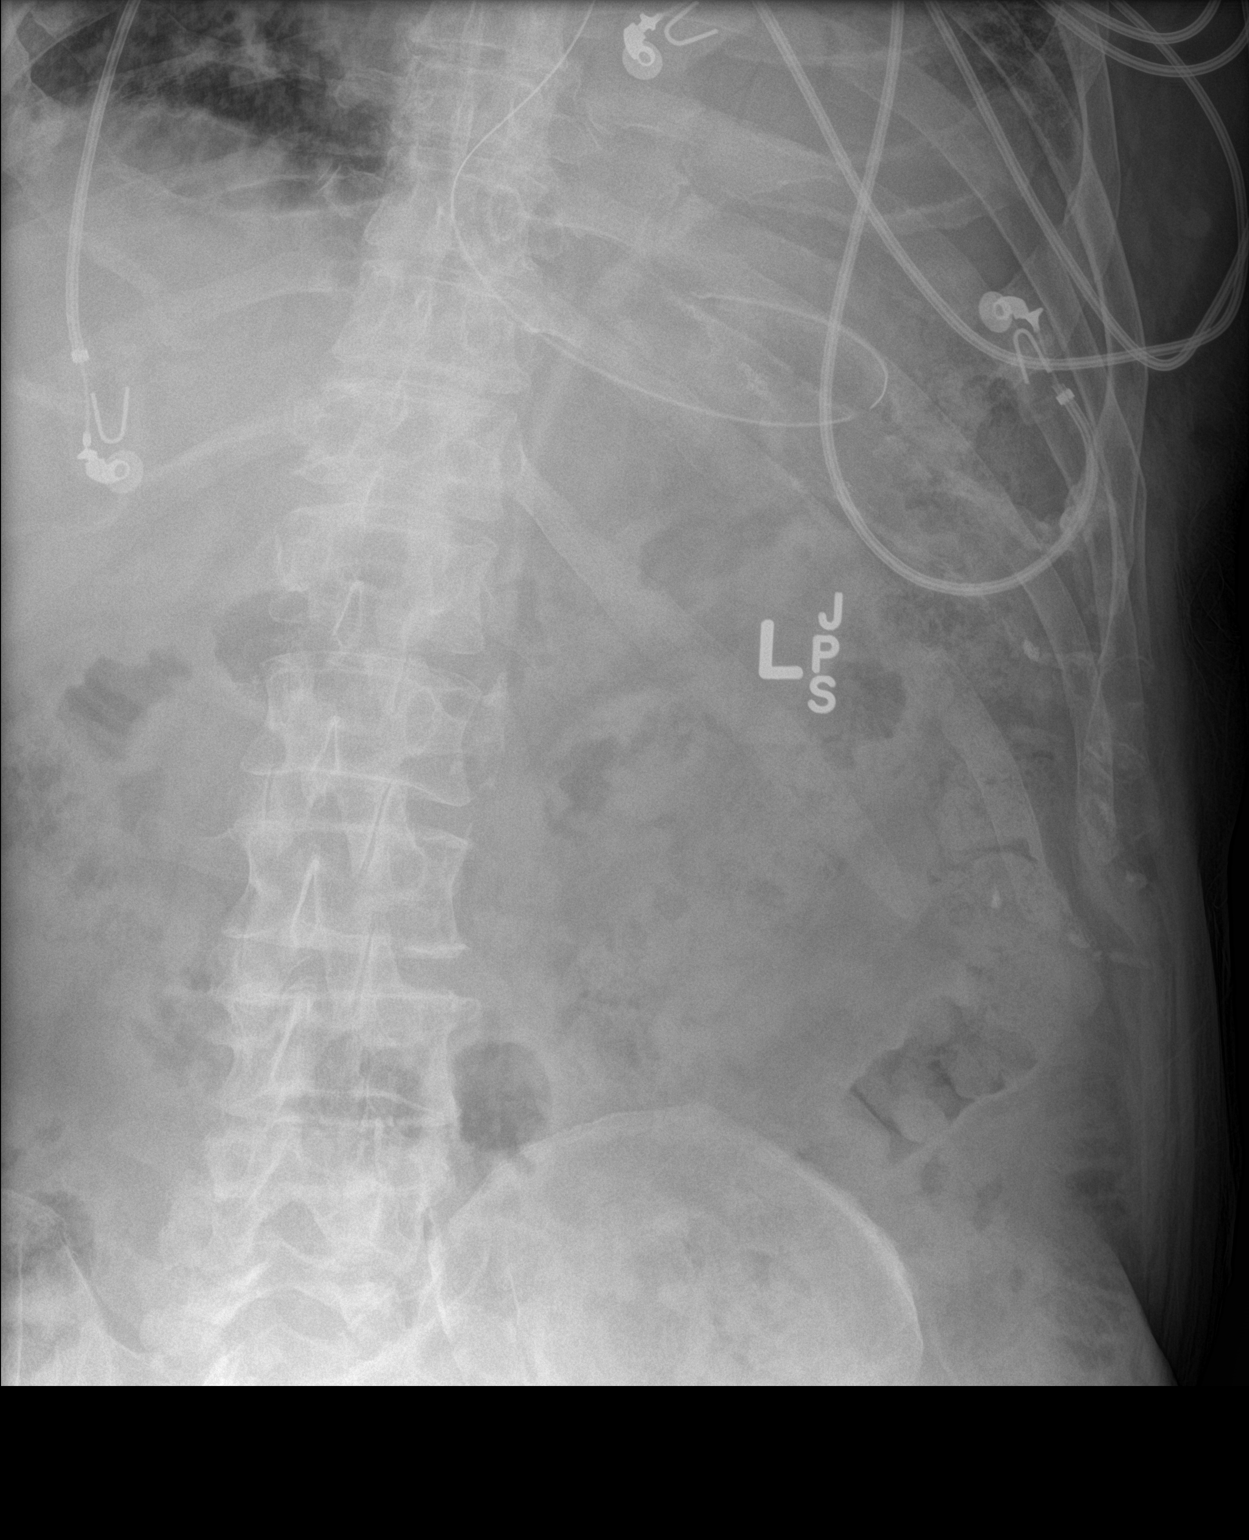

[1 of 1 positions shown; findings below may reference images not displayed]

FINDINGS: Nasogastric tube enters the stomach in has its tip in the fundus.
IMPRESSION: Nasogastric tip in the fundus.

## 2019-02-10 IMAGING — CR DG ABD PORTABLE 1V
1 series · 1 of 1 positions shown · non-contrast
Comparison: Radiograph December 23, 2016.

CLINICAL DATA: Nasogastric tube placement.

EXAM:
PORTABLE ABDOMEN - 1 VIEW

[AP]
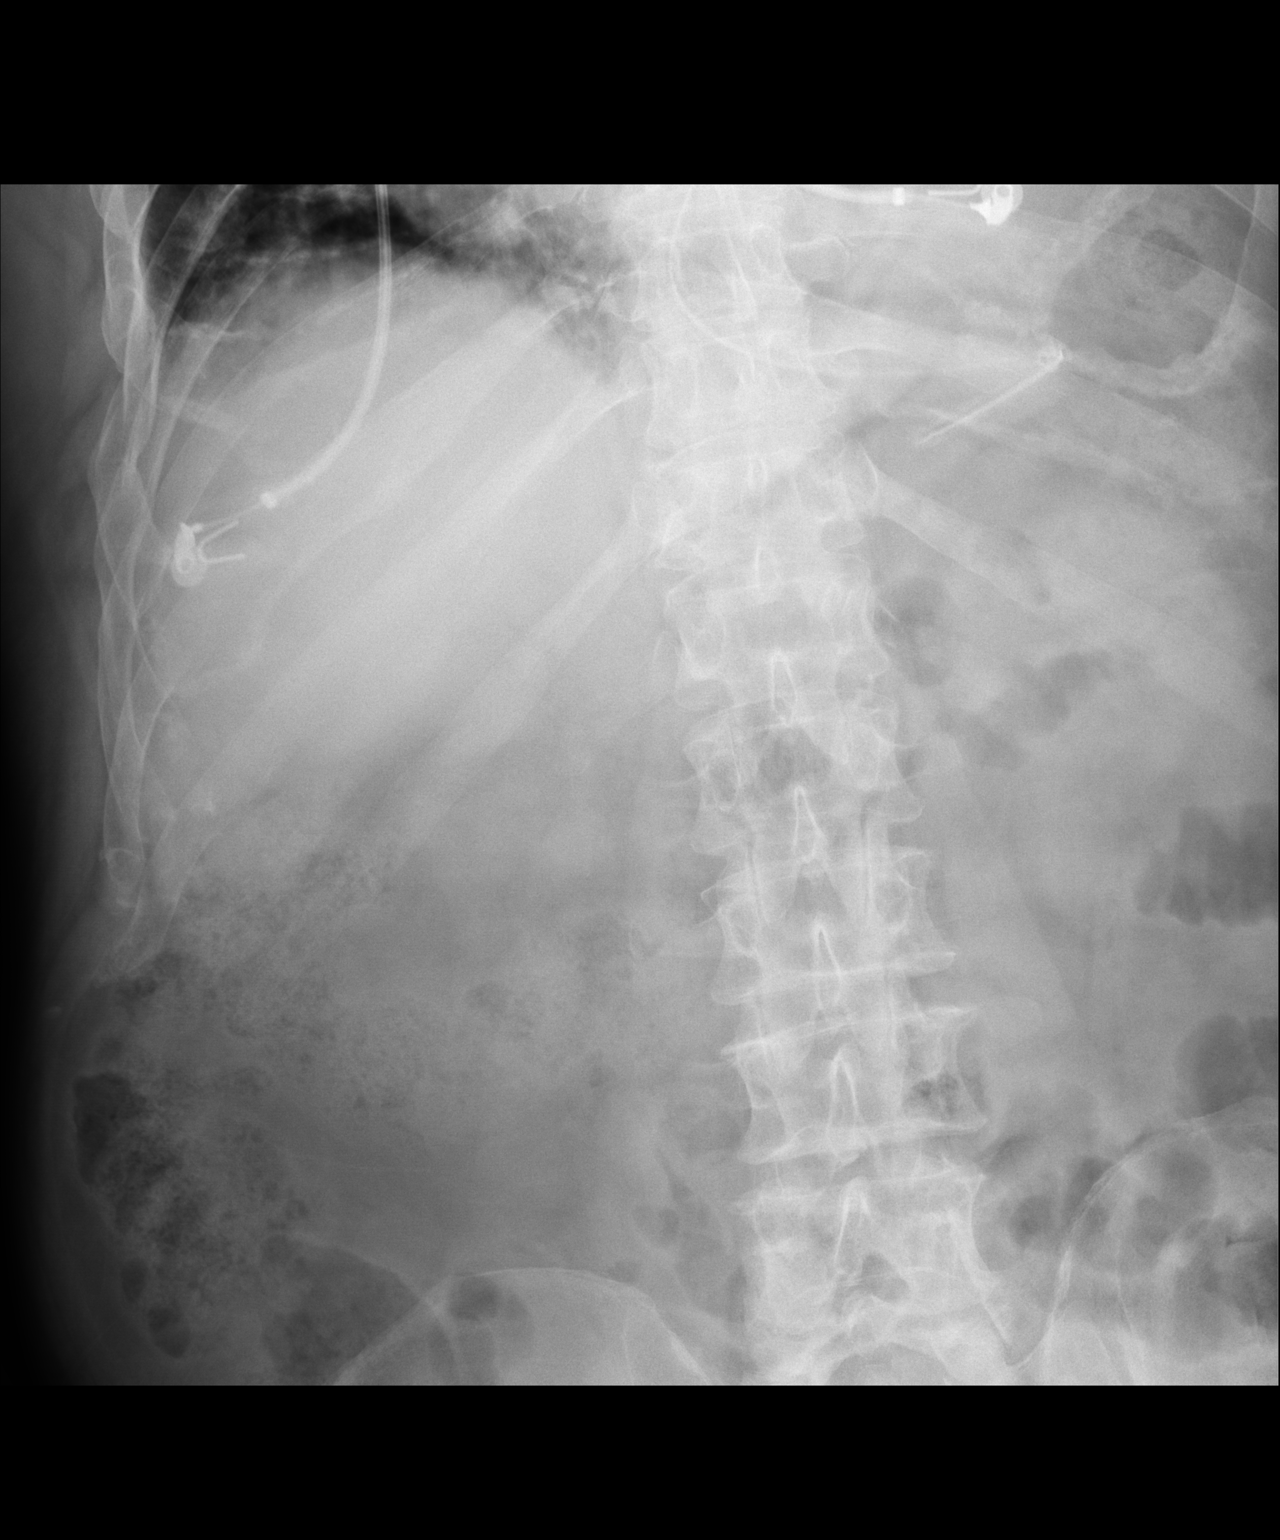

[1 of 1 positions shown; findings below may reference images not displayed]

FINDINGS: The bowel gas pattern is normal. Nasogastric tube tip is seen in
proximal stomach.
IMPRESSION: No evidence of bowel obstruction or ileus. Nasogastric tube tip seen
in proximal stomach.

## 2019-02-11 IMAGING — DX DG CHEST 1V PORT
1 series · 1 of 1 positions shown · non-contrast
Comparison: Chest x-rays dated 12/21/2016 and 12/14/2016. Chest CT
dated 12/23/2016.

CLINICAL DATA: Pneumonia. History of acute respiratory failure,
CHF, coronary artery disease, hypertension.

EXAM:
PORTABLE CHEST 1 VIEW

[chest ap]
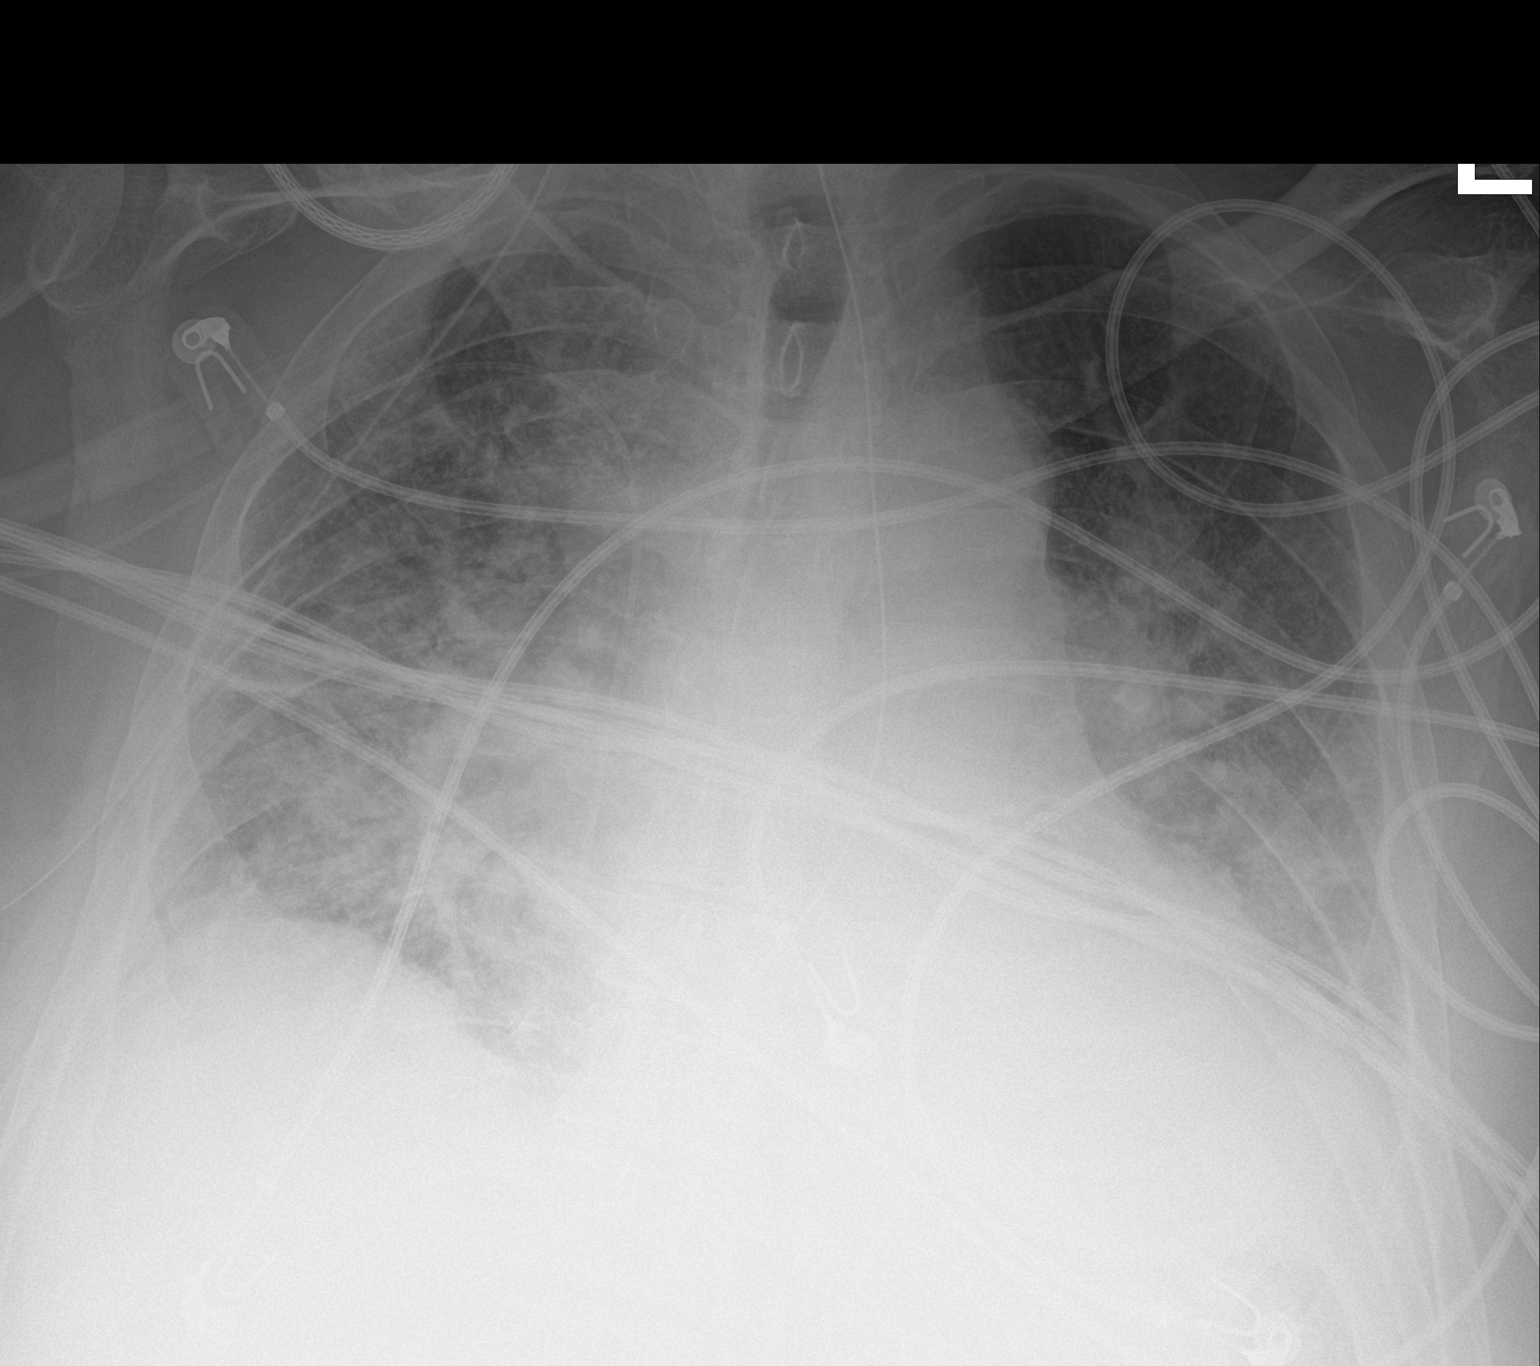

[1 of 1 positions shown; findings below may reference images not displayed]

FINDINGS: Cardiomegaly appears grossly stable. Overall cardiomediastinal
silhouette appears stable. Enteric tube passes below the diaphragm.
Right-sided PICC line in place with tip at the level of the lower
SVC.

Bilateral pulmonary edema pattern appears stable. Denser opacity at
the left lung base is stable, compatible with the atelectasis and/or
pneumonia described on recent chest CT report. No new lung findings.
No pneumothorax seen.
IMPRESSION: No significant interval change. Bilateral pulmonary edema pattern
appears stable. Left lower lobe atelectasis and/or pneumonia appears
stable, better demonstrated on recent chest CT. Stable cardiomegaly.

## 2019-02-13 IMAGING — DX DG CHEST 1V PORT
1 series · 1 of 1 positions shown · non-contrast
Comparison: Portable chest x-ray of 12/26/2016

CLINICAL DATA: Respiratory failure, endotracheal tube placement

EXAM:
PORTABLE CHEST 1 VIEW

[chest ap]
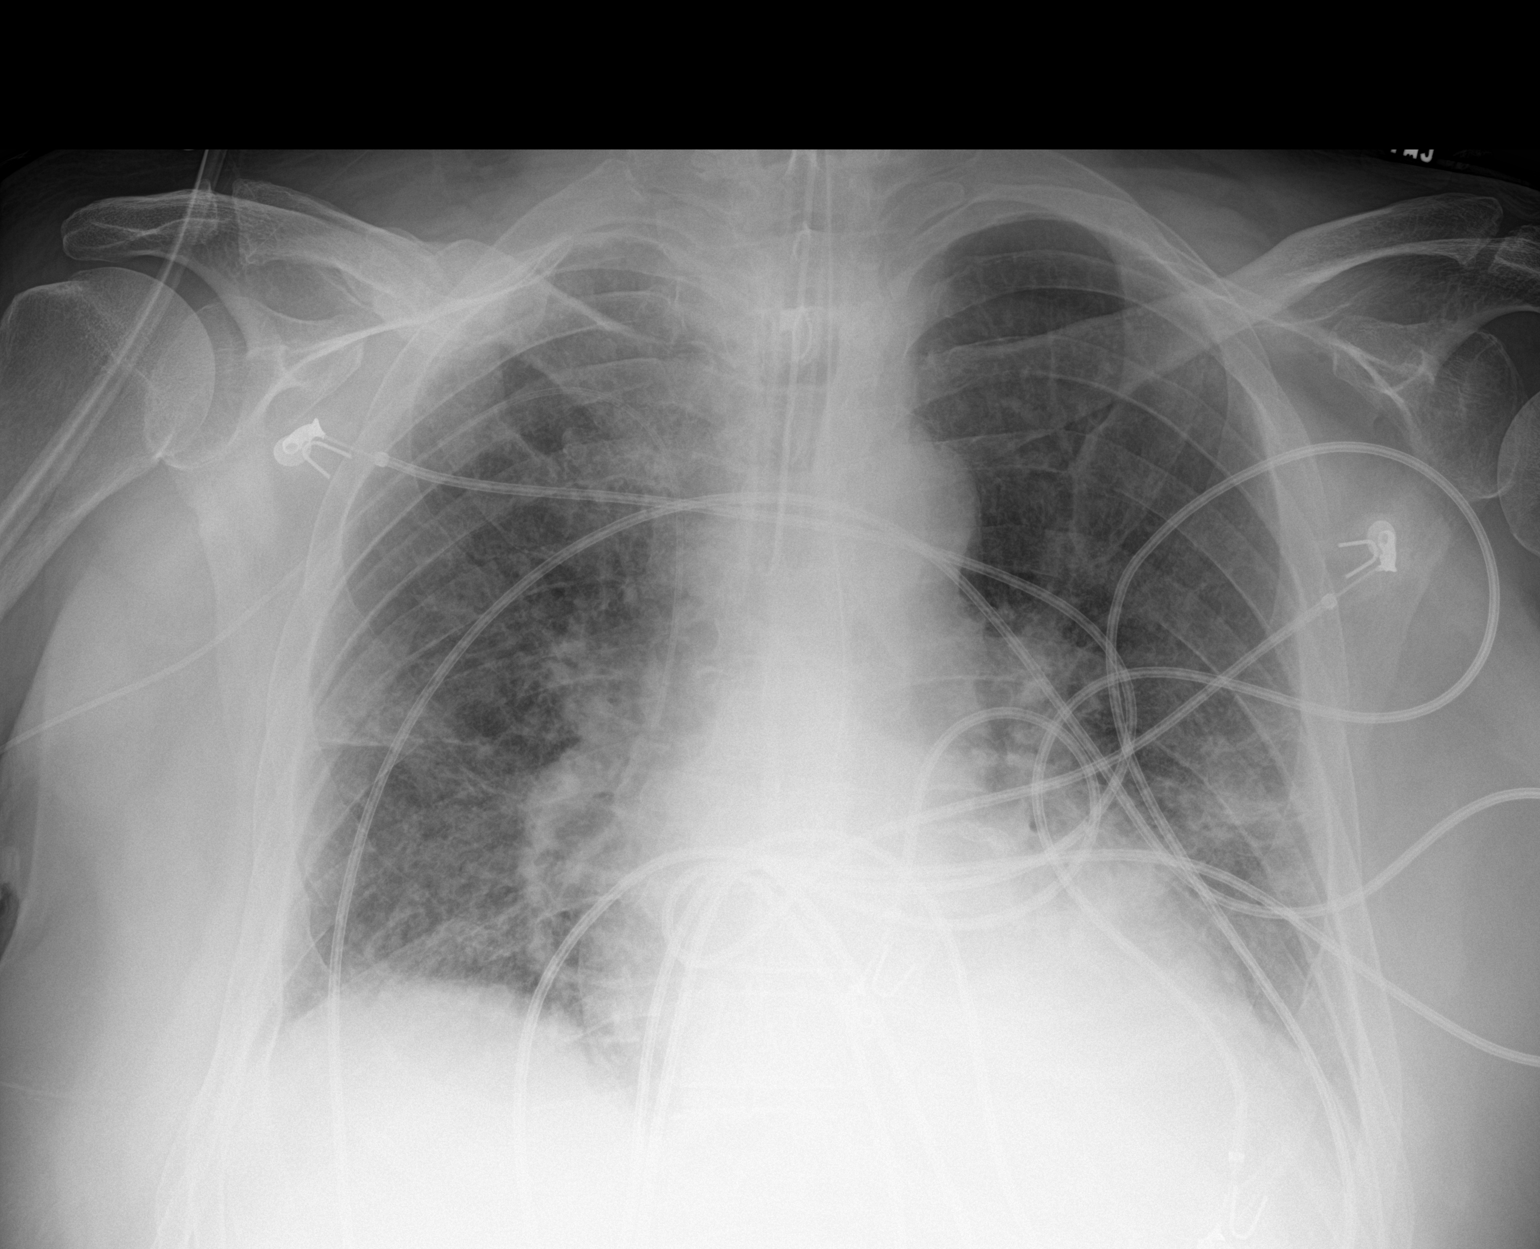

[1 of 1 positions shown; findings below may reference images not displayed]

FINDINGS: The tip of the endotracheal tube is somewhat low in position only
1.3 cm above the carina. The lungs do appear to be slightly better
aerated with patchy airspace disease somewhat improved. Cardiomegaly
is stable. Right PICC line tip overlies the mid lower SVC.
IMPRESSION: 1. Tip of endotracheal tube is somewhat low in position, only 1.3 cm
above the carina.
2. Improvement in diffuse airspace disease.
3. Stable cardiomegaly.

## 2019-02-18 IMAGING — CR DG CHEST 1V PORT
1 series · 1 of 1 positions shown · non-contrast
Comparison: 12/27/2016 and prior exam

CLINICAL DATA: Follow-up pneumonia.

EXAM:
PORTABLE CHEST 1 VIEW

[AP]
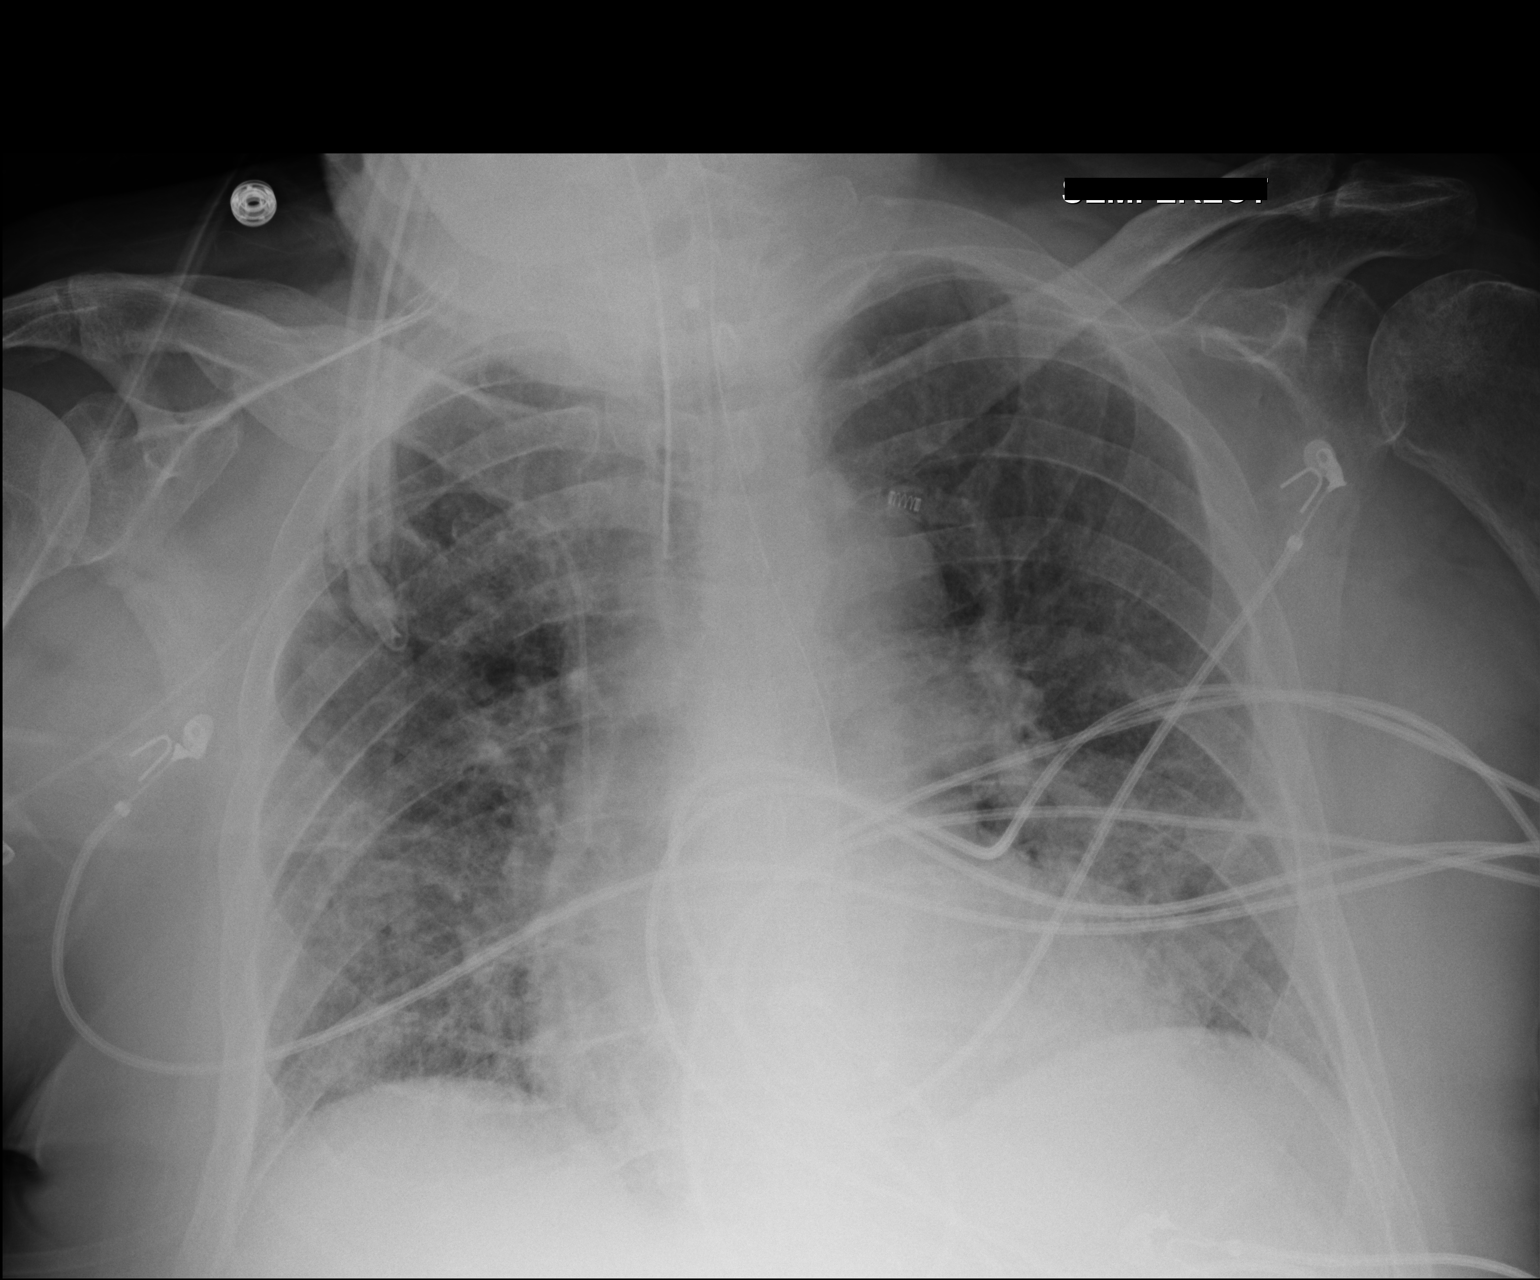

[1 of 1 positions shown; findings below may reference images not displayed]

FINDINGS: An endotracheal tube with tip 3 cm above the carina, and NG tube
with tip difficult to visualize noted.

A right PICC line with tip overlying the superior cavoatrial
junction again noted.

Cardiomegaly, and bilateral interstitial and airspace opacities are
unchanged. There is no evidence of pneumothorax.
IMPRESSION: Little significant change. Bilateral interstitial and airspace
opacities again noted.

## 2019-02-23 IMAGING — DX DG ABD PORTABLE 1V
1 series · 1 of 1 positions shown · non-contrast
Comparison: 12/24/2016

CLINICAL DATA: Check nasogastric catheter placement

EXAM:
PORTABLE ABDOMEN - 1 VIEW

[abdomen kub]
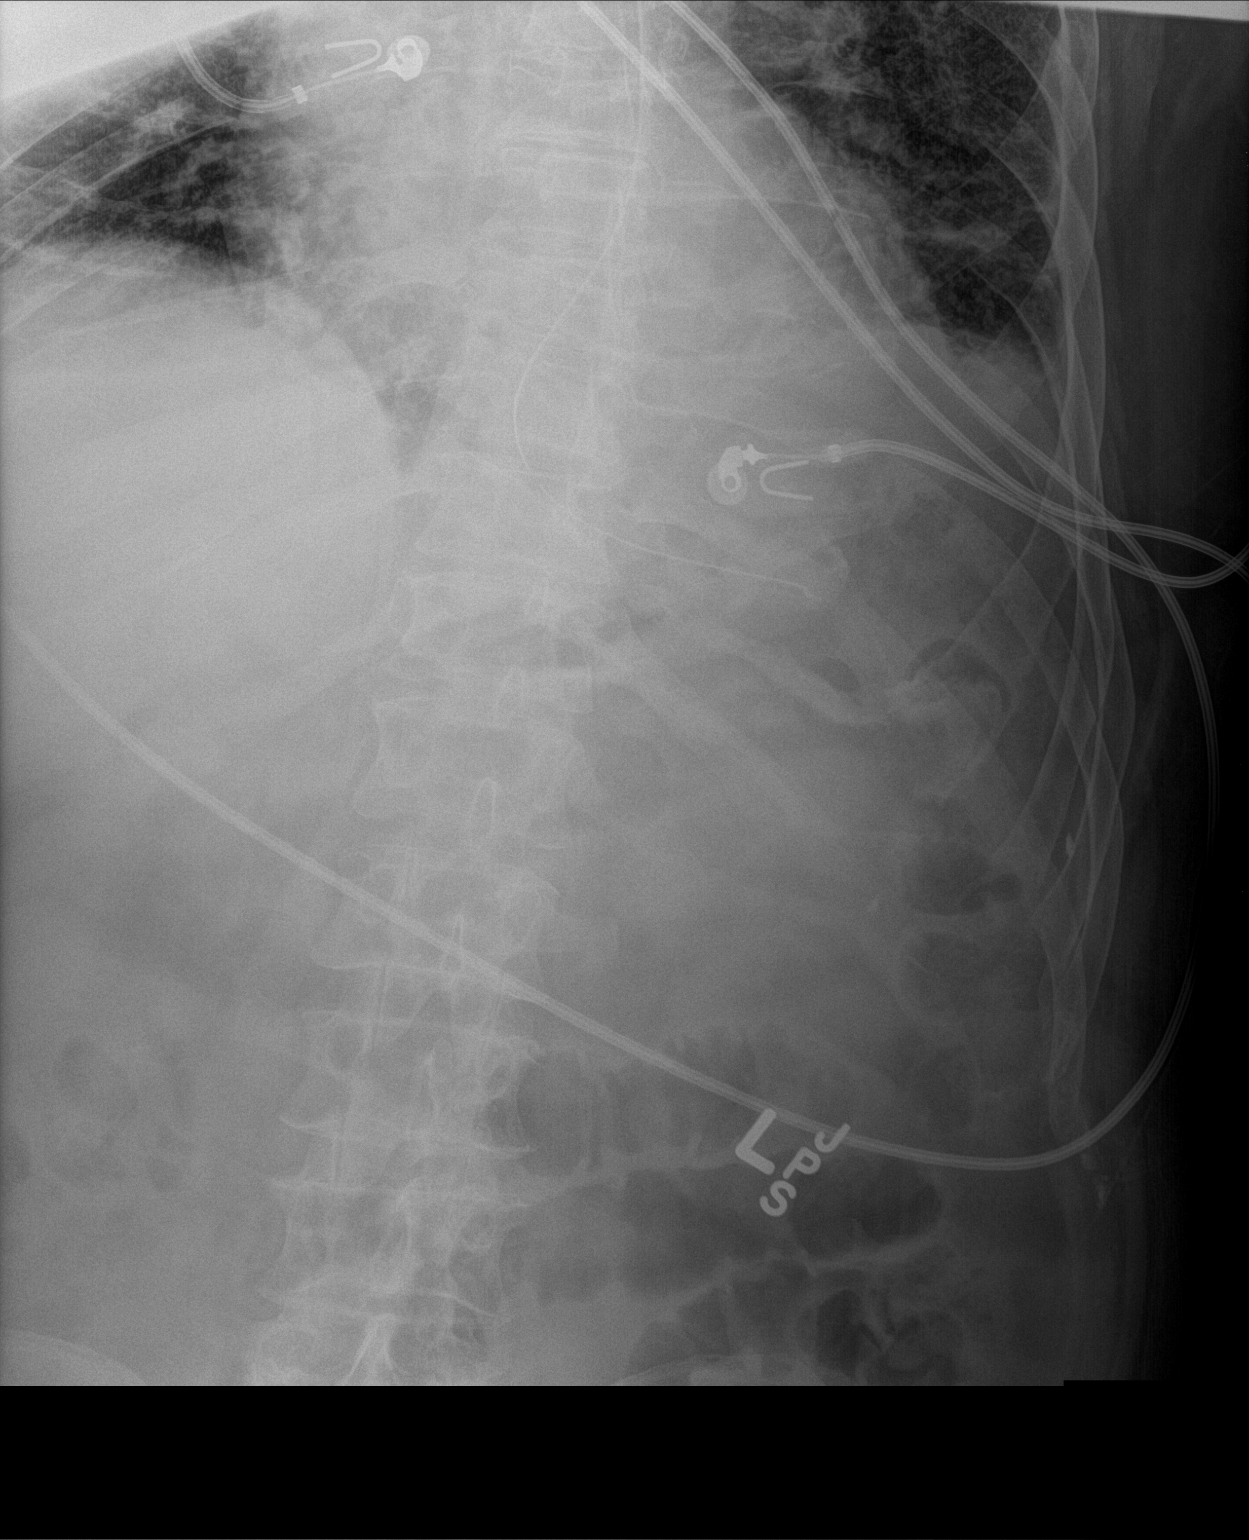

[1 of 1 positions shown; findings below may reference images not displayed]

FINDINGS: Nasogastric catheter is noted with the tip in the stomach. The
stomach is decompressed. The proximal side port lies within the
distal esophagus. This should be advanced further into the stomach.
No obstructive changes are noted.
IMPRESSION: Nasogastric catheter as described.

## 2019-02-23 IMAGING — CR DG ABD PORTABLE 1V
1 series · 1 of 1 positions shown · non-contrast
Comparison: 01/06/2017

CLINICAL DATA: NG tube placement

EXAM:
PORTABLE ABDOMEN - 1 VIEW

[AP]
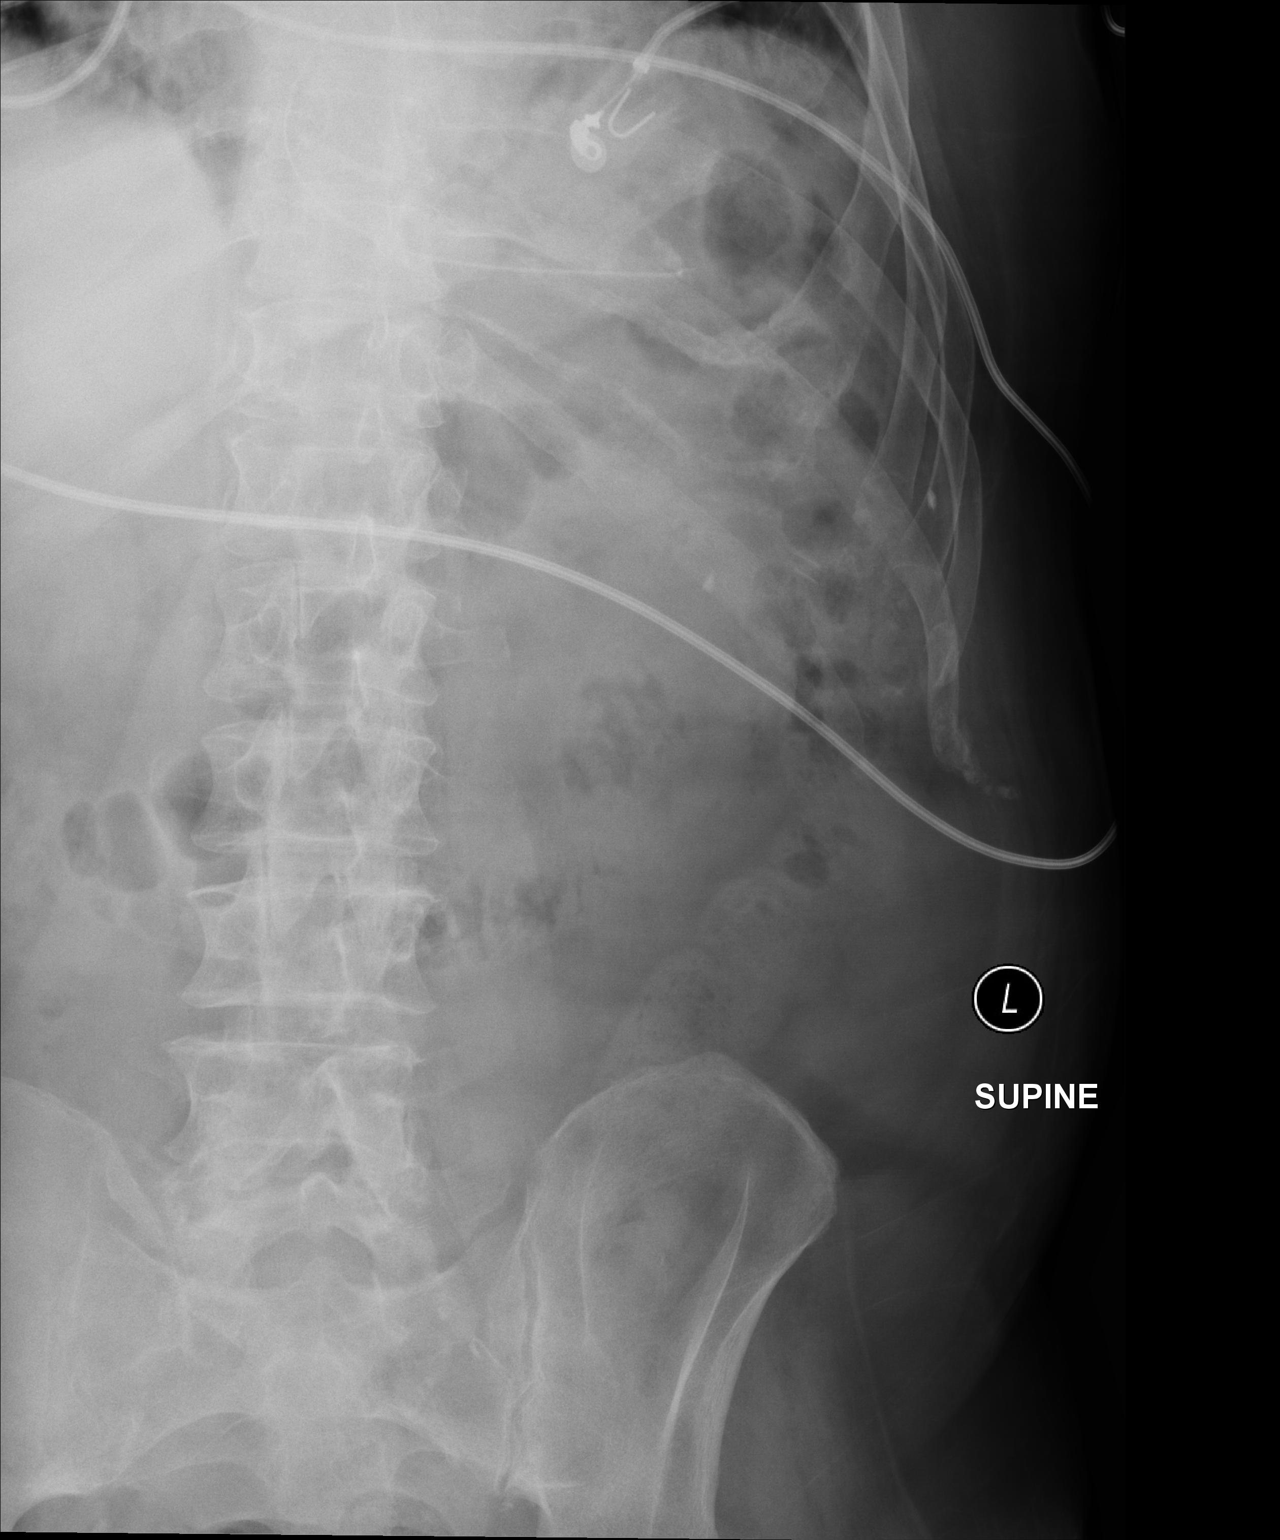

[1 of 1 positions shown; findings below may reference images not displayed]

FINDINGS: Esophageal tube tip overlies the proximal stomach. Upper gas pattern
is unremarkable.
IMPRESSION: Esophageal tube tip overlies proximal stomach, side-port probably in
the region of GE junction, further advancement by at least 5 cm for
more optimal positioning is suggested.

## 2019-02-24 IMAGING — CR DG ABD PORTABLE 1V
1 series · 1 of 1 positions shown · non-contrast
Comparison: 01/06/2017

CLINICAL DATA: Nasogastric tube placement.

EXAM:
PORTABLE ABDOMEN - 1 VIEW

[AP]
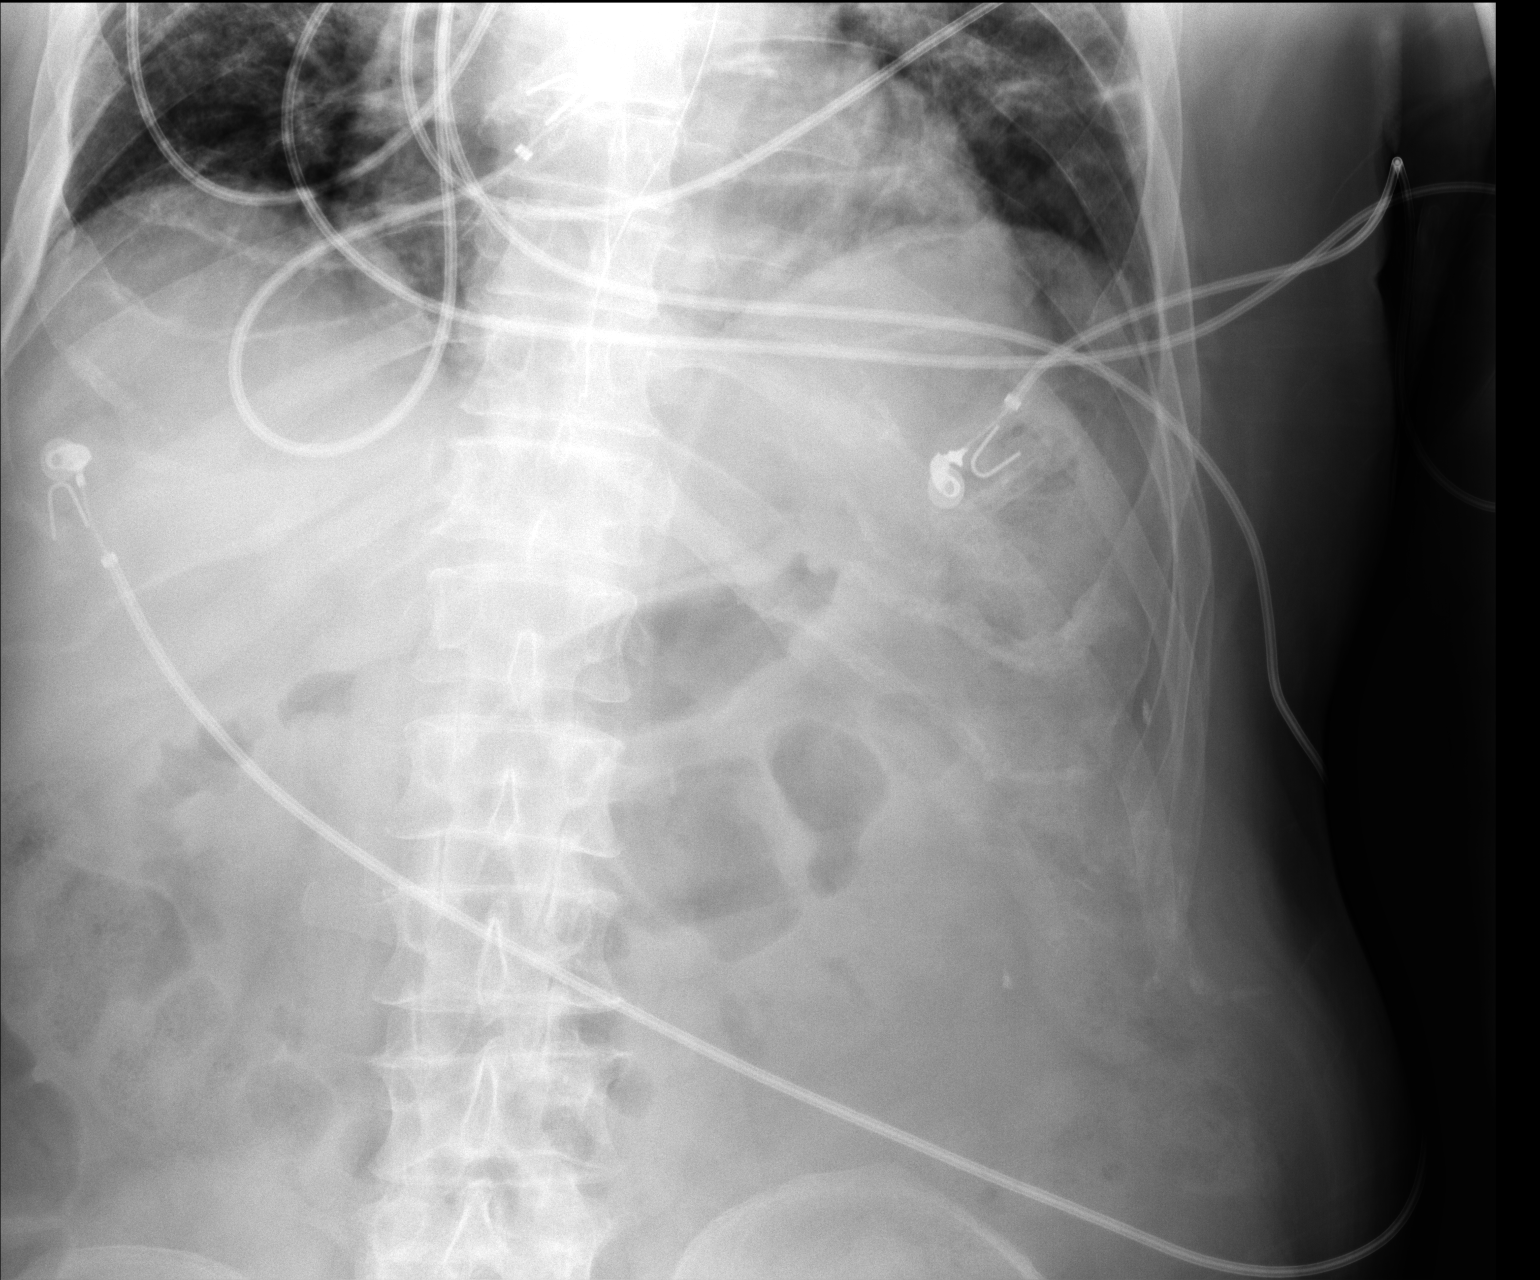

[1 of 1 positions shown; findings below may reference images not displayed]

FINDINGS: An enteric tube is present with tip projected over the abdominal
hiatus consistent with location in the EG junction. Proximal side
hole is over the distal esophagus. Advancement is suggested.
IMPRESSION: Enteric tube tip is projected over the lower chest consistent with
location in the distal esophagus.

## 2019-02-24 IMAGING — CR DG ABD PORTABLE 1V
1 series · 1 of 1 positions shown · non-contrast
Comparison: January 07, 2017

CLINICAL DATA: Evaluate NG tube.

EXAM:
PORTABLE ABDOMEN - 1 VIEW

[AP]
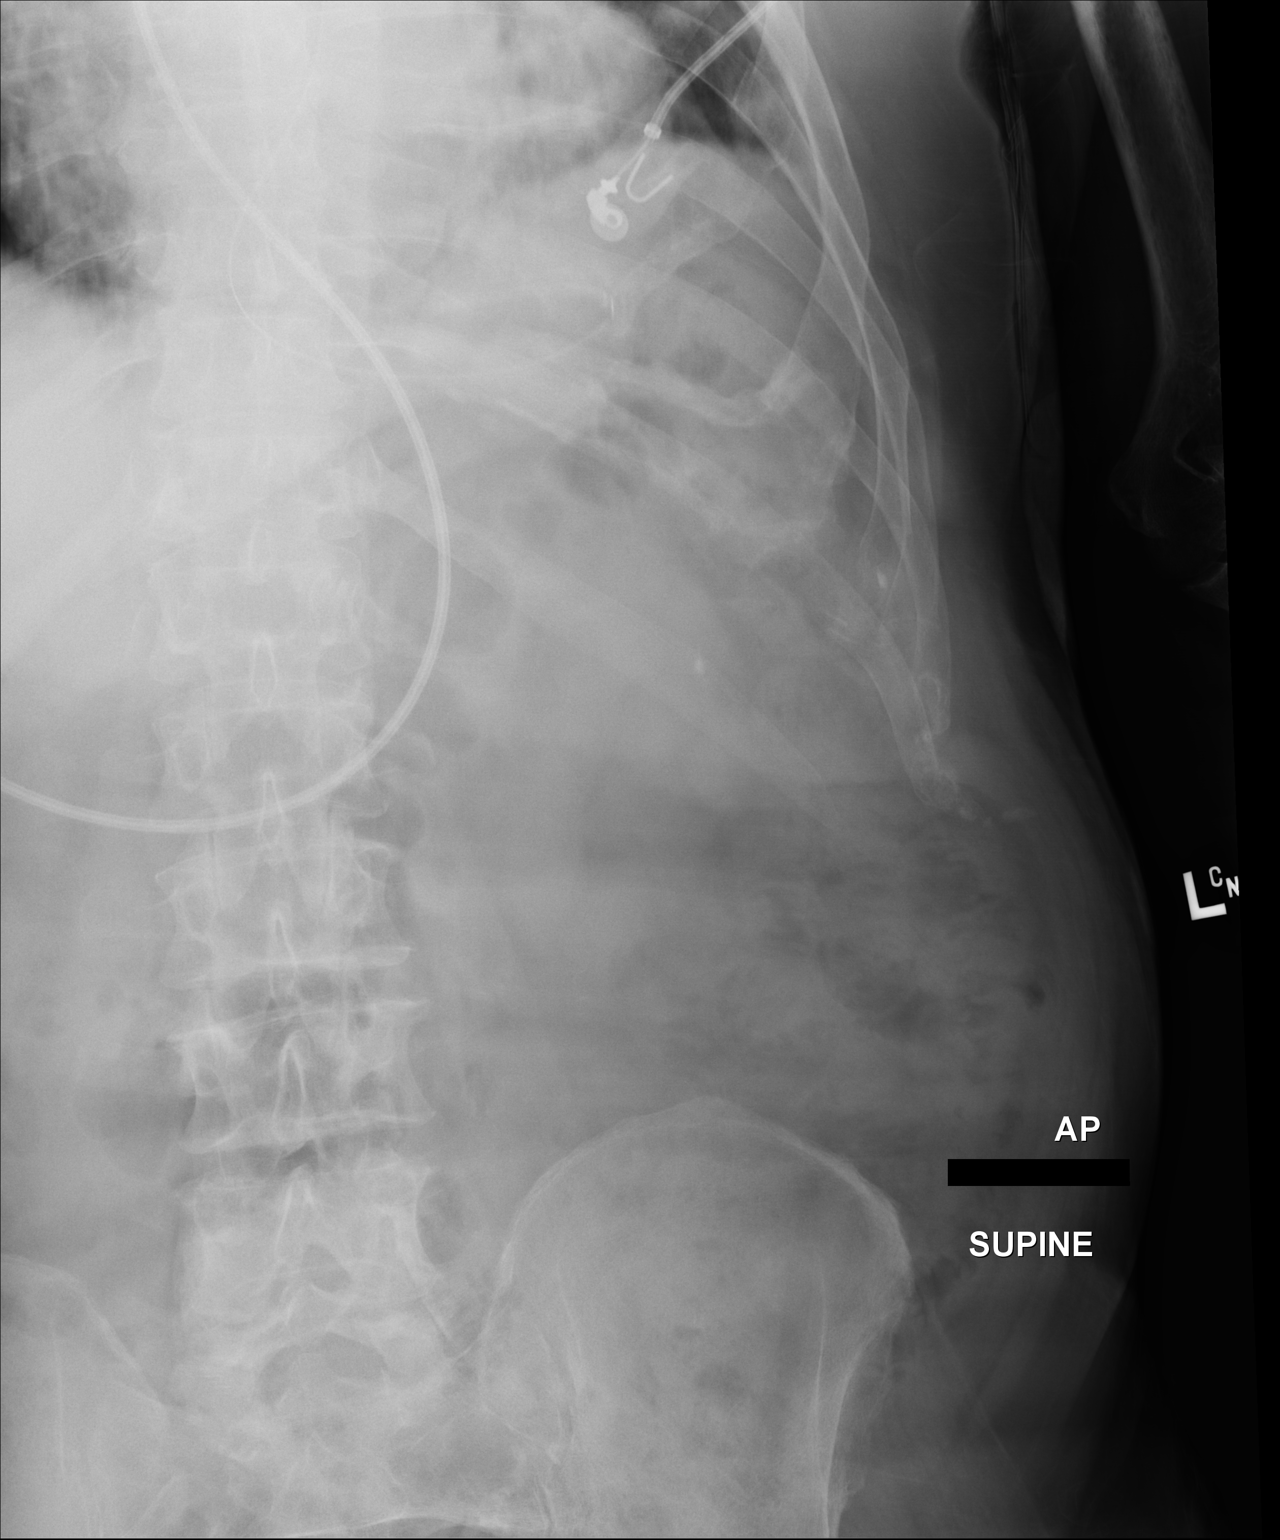

[1 of 1 positions shown; findings below may reference images not displayed]

FINDINGS: The NG tube terminates just below the left hemidiaphragm with both
the distal tip and side ports in the stomach.
IMPRESSION: NG tube placement as above.

## 2019-02-24 IMAGING — CR DG CHEST 1V PORT
1 series · 1 of 1 positions shown · non-contrast
Comparison: 01/01/2017.

CLINICAL DATA: Intubated.  Acute respiratory failure.  Ex-smoker.

EXAM:
PORTABLE CHEST 1 VIEW

[AP]
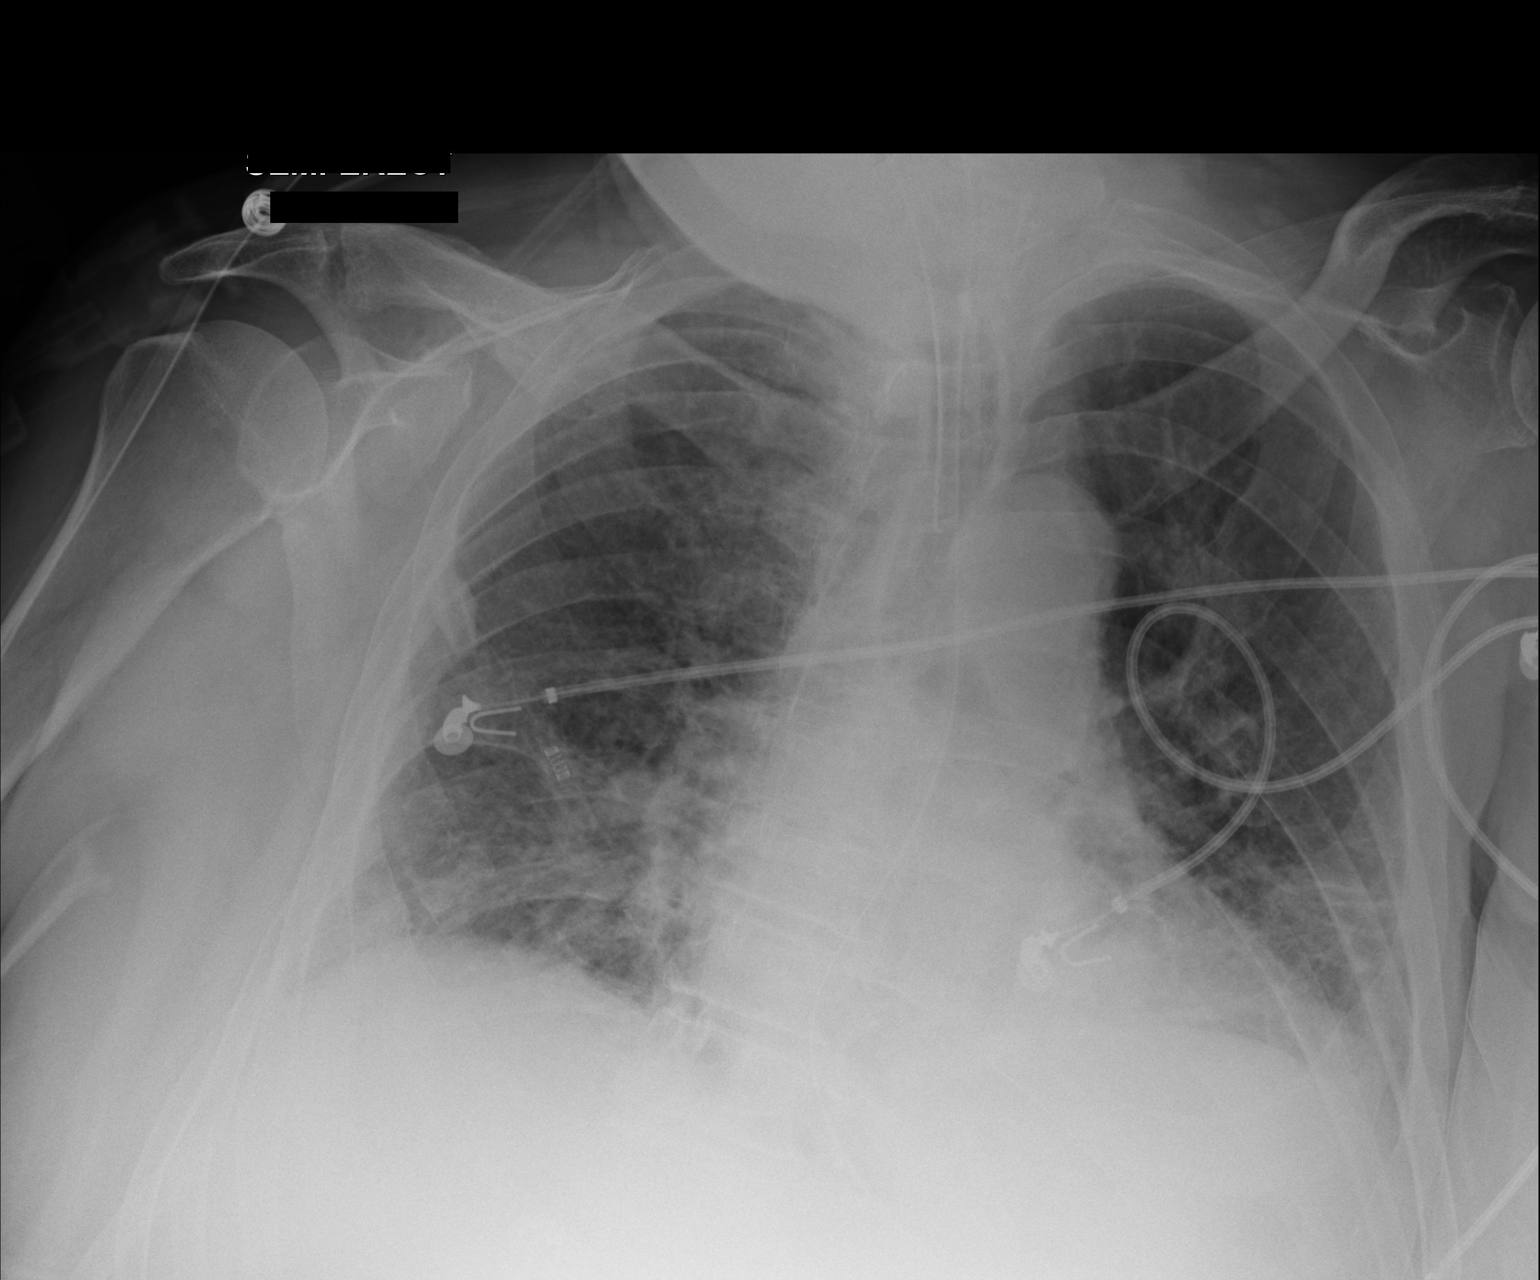

[1 of 1 positions shown; findings below may reference images not displayed]

FINDINGS: Stable mildly enlarged cardiac silhouette. Endotracheal tube in
satisfactory position. Nasogastric tube extending into the stomach.
Mildly decreased prominence of the interstitial markings. Increased
linear density in the left lower lung zone. Unremarkable bones.
IMPRESSION: 1. Improving interstitial pulmonary edema.
2. Mildly increased linear atelectasis at the left lung base.

## 2019-03-06 IMAGING — DX DG CHEST 1V PORT
1 series · 1 of 1 positions shown · non-contrast
Comparison: 01/07/2017.

CLINICAL DATA: Respiratory failure.

EXAM:
PORTABLE CHEST 1 VIEW

[chest ap]
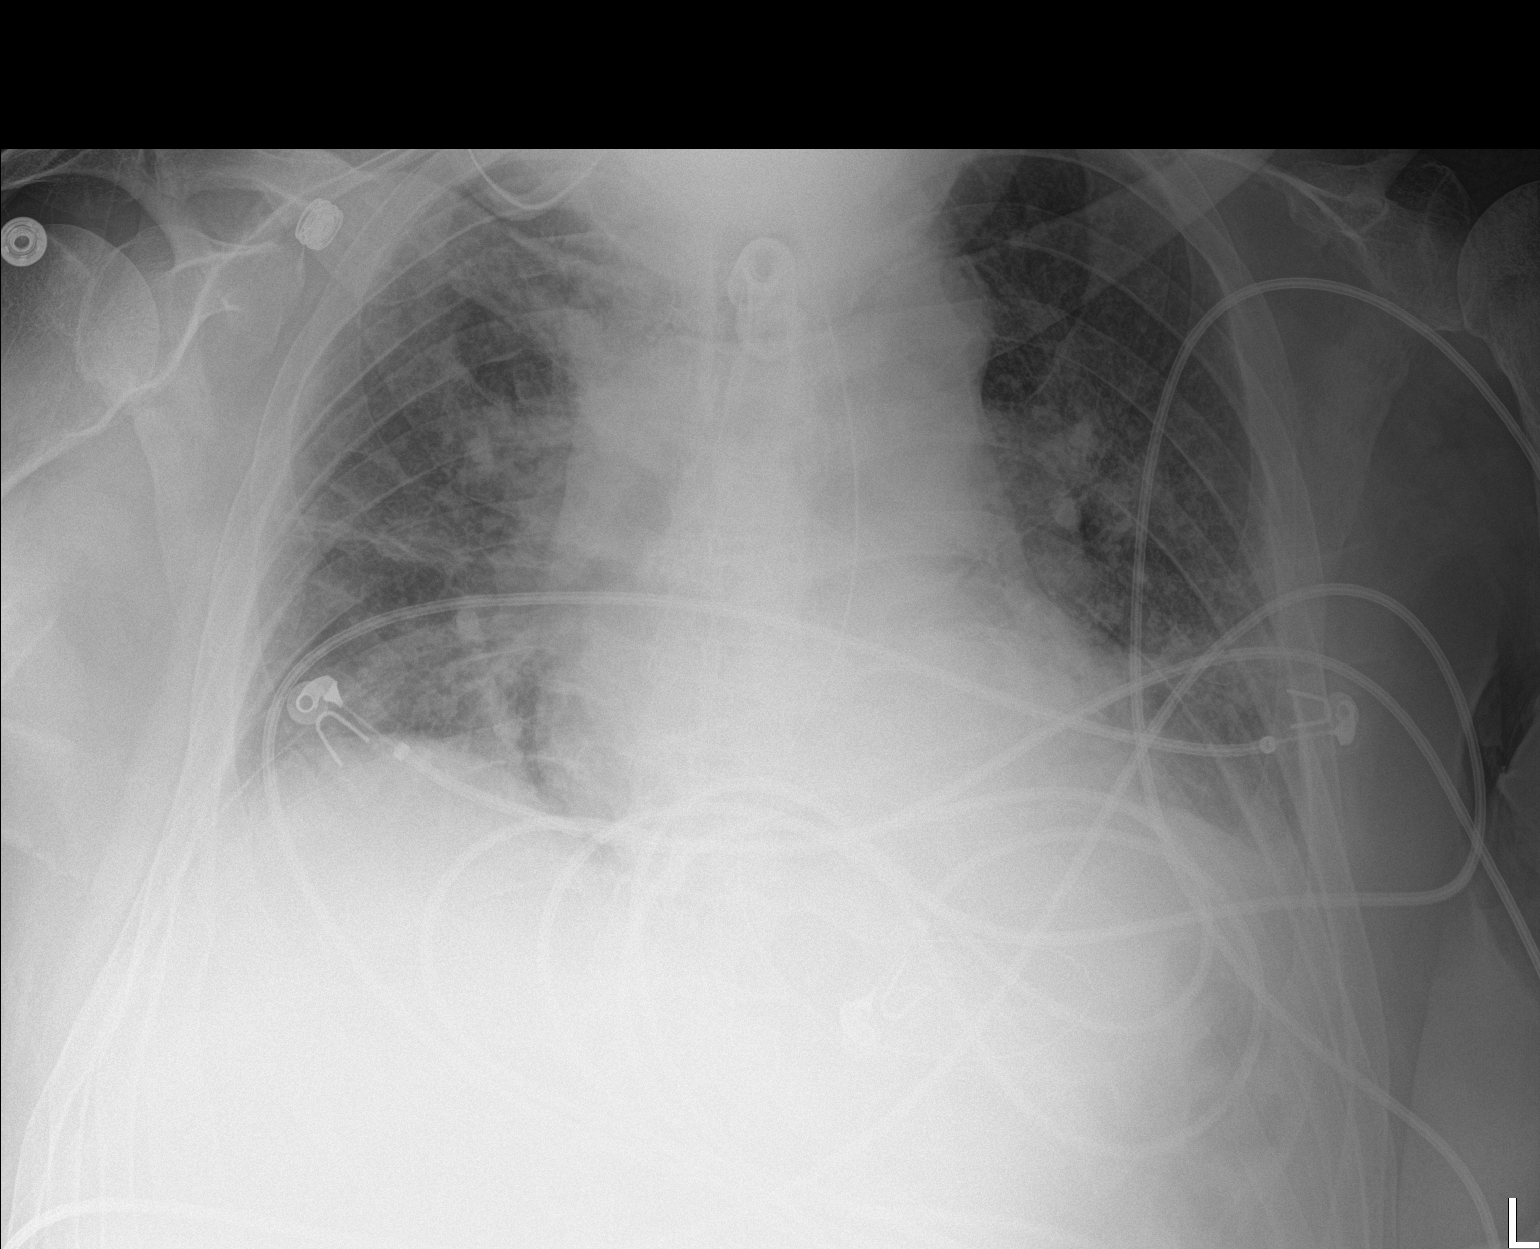

[1 of 1 positions shown; findings below may reference images not displayed]

FINDINGS: Interim placement of tracheostomy tube, its tip is in good anatomic
position. NG tube in stable position. Cardiomegaly with pulmonary
vascular prominence and bilateral interstitial prominence consistent
CHF. Similar findings noted on prior exam. Tiny bilateral pleural
effusions cannot be excluded. No pneumothorax P
IMPRESSION: 1. Interim placement of tracheostomy tube, its tip is in good
anatomic position. NG tube noted in good anatomic position.

2. Cardiomegaly with pulmonary vascular prominence and bilateral
interstitial prominence consistent with CHF. Similar findings on
prior exam .

## 2019-03-08 IMAGING — CR DG ABD PORTABLE 1V
1 series · 1 of 1 positions shown · non-contrast
Comparison: Abdominal radiograph performed earlier today at [DATE]
p.m.

CLINICAL DATA: Nasogastric tube placement.  Initial encounter.

EXAM:
PORTABLE ABDOMEN - 1 VIEW

[AP]
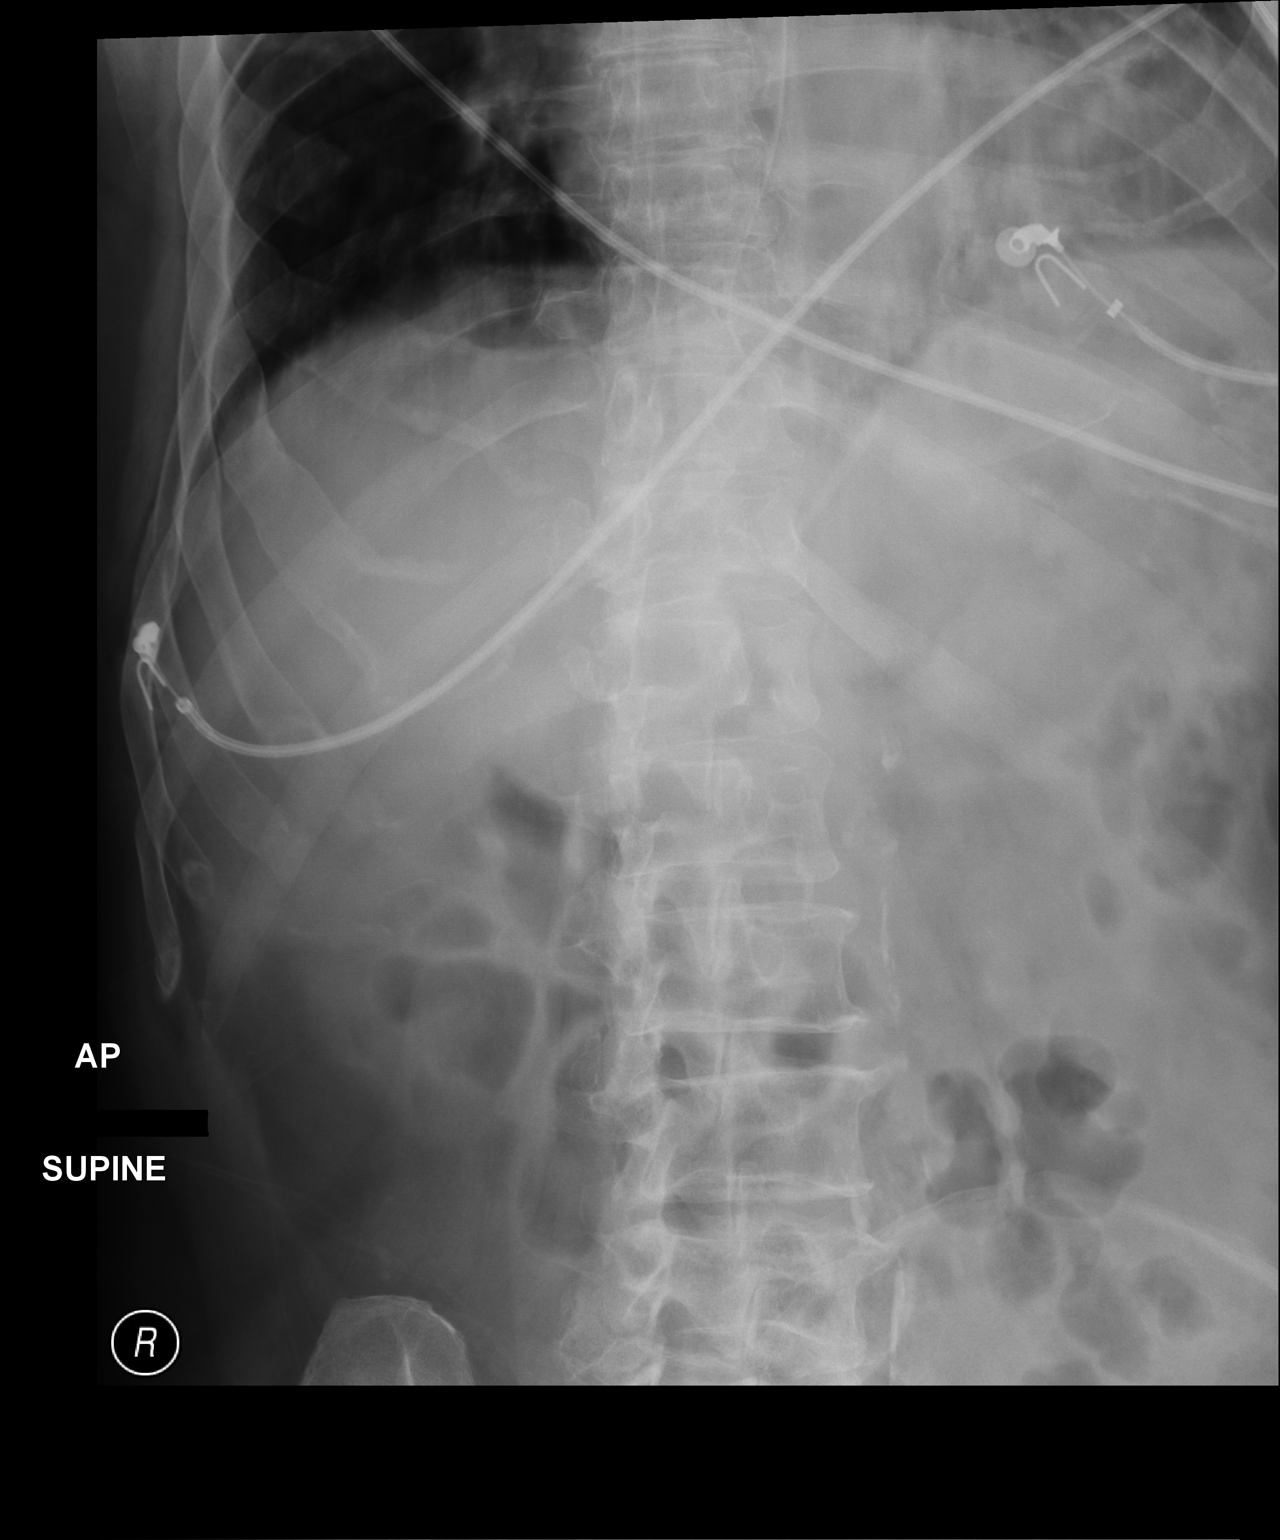

[1 of 1 positions shown; findings below may reference images not displayed]

FINDINGS: The patient's enteric tube is noted ending overlying the fundus of
the stomach.

The visualized bowel gas pattern is grossly unremarkable. No free
intra-abdominal air is seen, though evaluation for free air is
limited on a single supine view. No acute osseous abnormalities are
identified.
IMPRESSION: Enteric tube noted ending overlying the fundus of the stomach.

## 2019-03-08 IMAGING — CR DG ABD PORTABLE 1V
1 series · 1 of 1 positions shown · non-contrast
Comparison: Abdominal radiograph performed earlier today at [DATE]
p.m.

CLINICAL DATA: Nasogastric tube placement.  Initial encounter.

EXAM:
PORTABLE ABDOMEN - 1 VIEW

[AP]
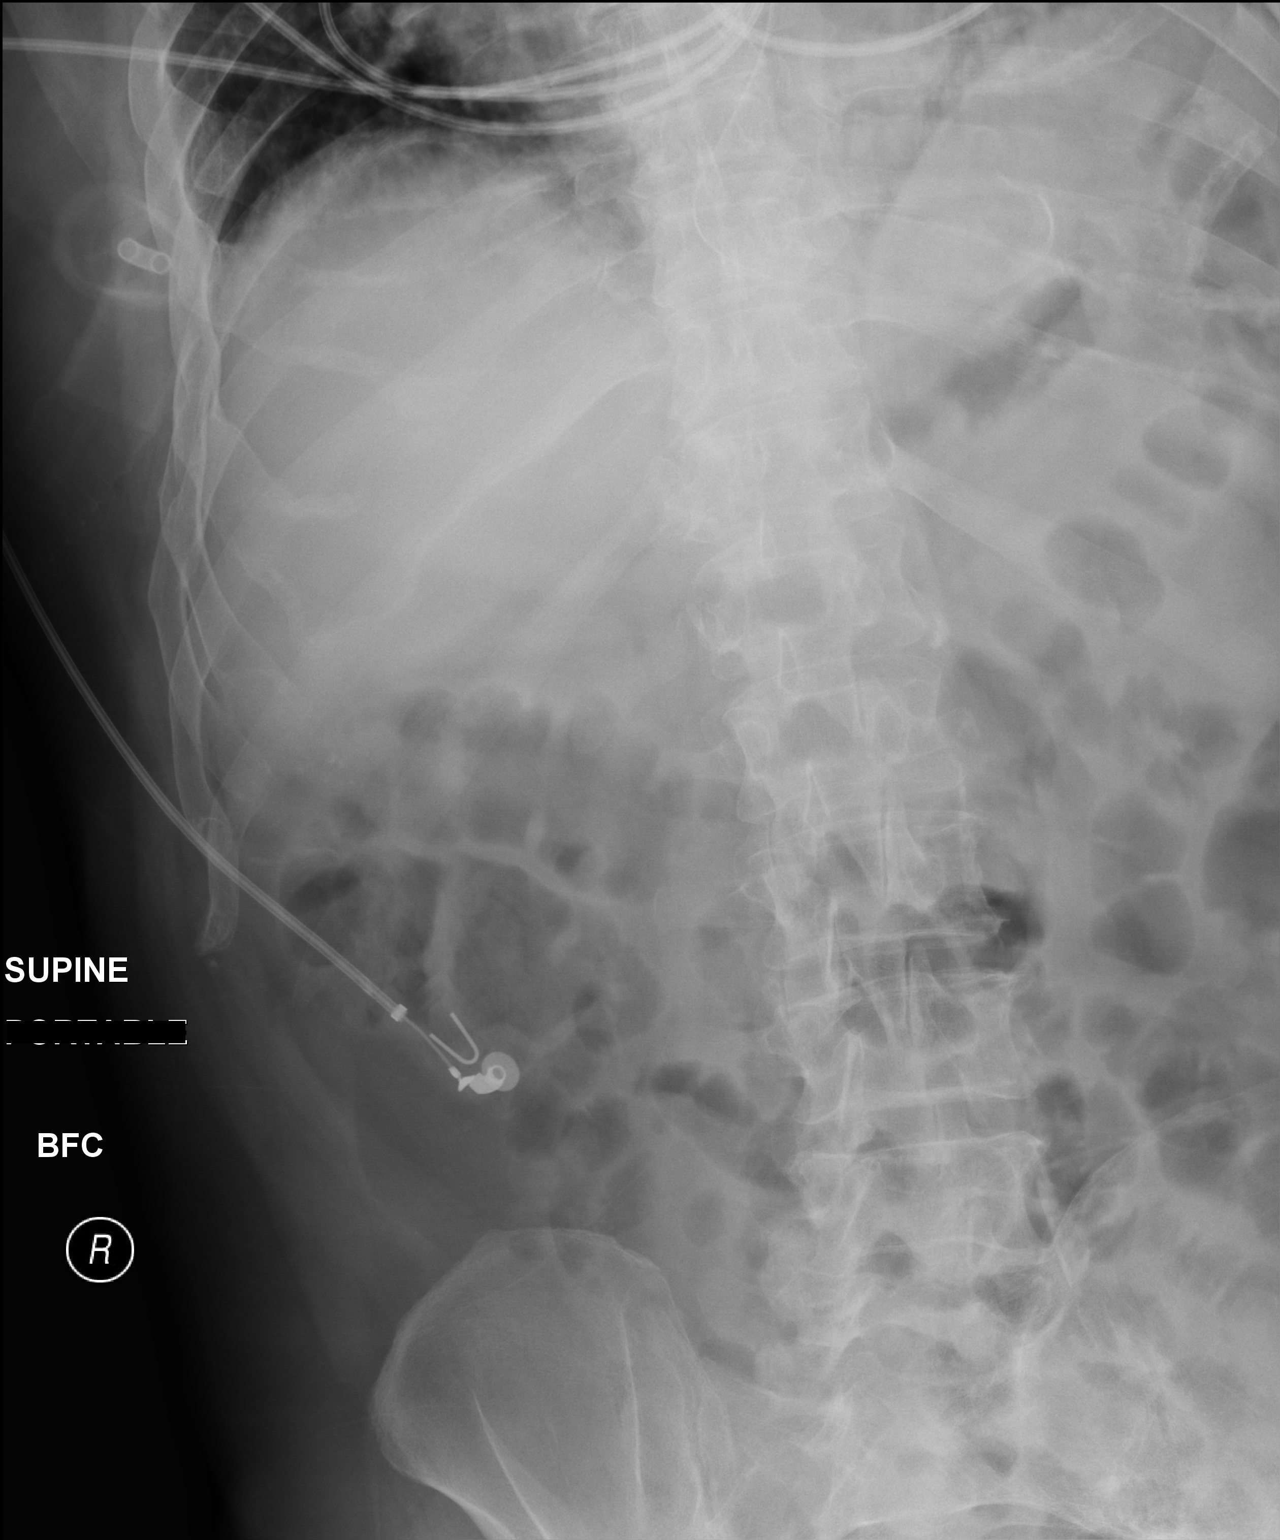

[1 of 1 positions shown; findings below may reference images not displayed]

FINDINGS: The patient's enteric tube is noted ending overlying the fundus of
the stomach.

The visualized bowel gas pattern is grossly unremarkable. No free
intra-abdominal air is seen, though evaluation for free air is
limited on a single supine view. No acute osseous abnormalities are
identified.
IMPRESSION: Enteric tube noted ending overlying the fundus of the stomach.

## 2019-03-08 IMAGING — CR DG ABD PORTABLE 1V
2 series · 2 of 2 positions shown · non-contrast
Comparison: Abdominal radiograph performed 01/07/2017

CLINICAL DATA: Nasogastric tube placement.  Initial encounter.

EXAM:
PORTABLE ABDOMEN - 1 VIEW

[AP (1 of 2)]
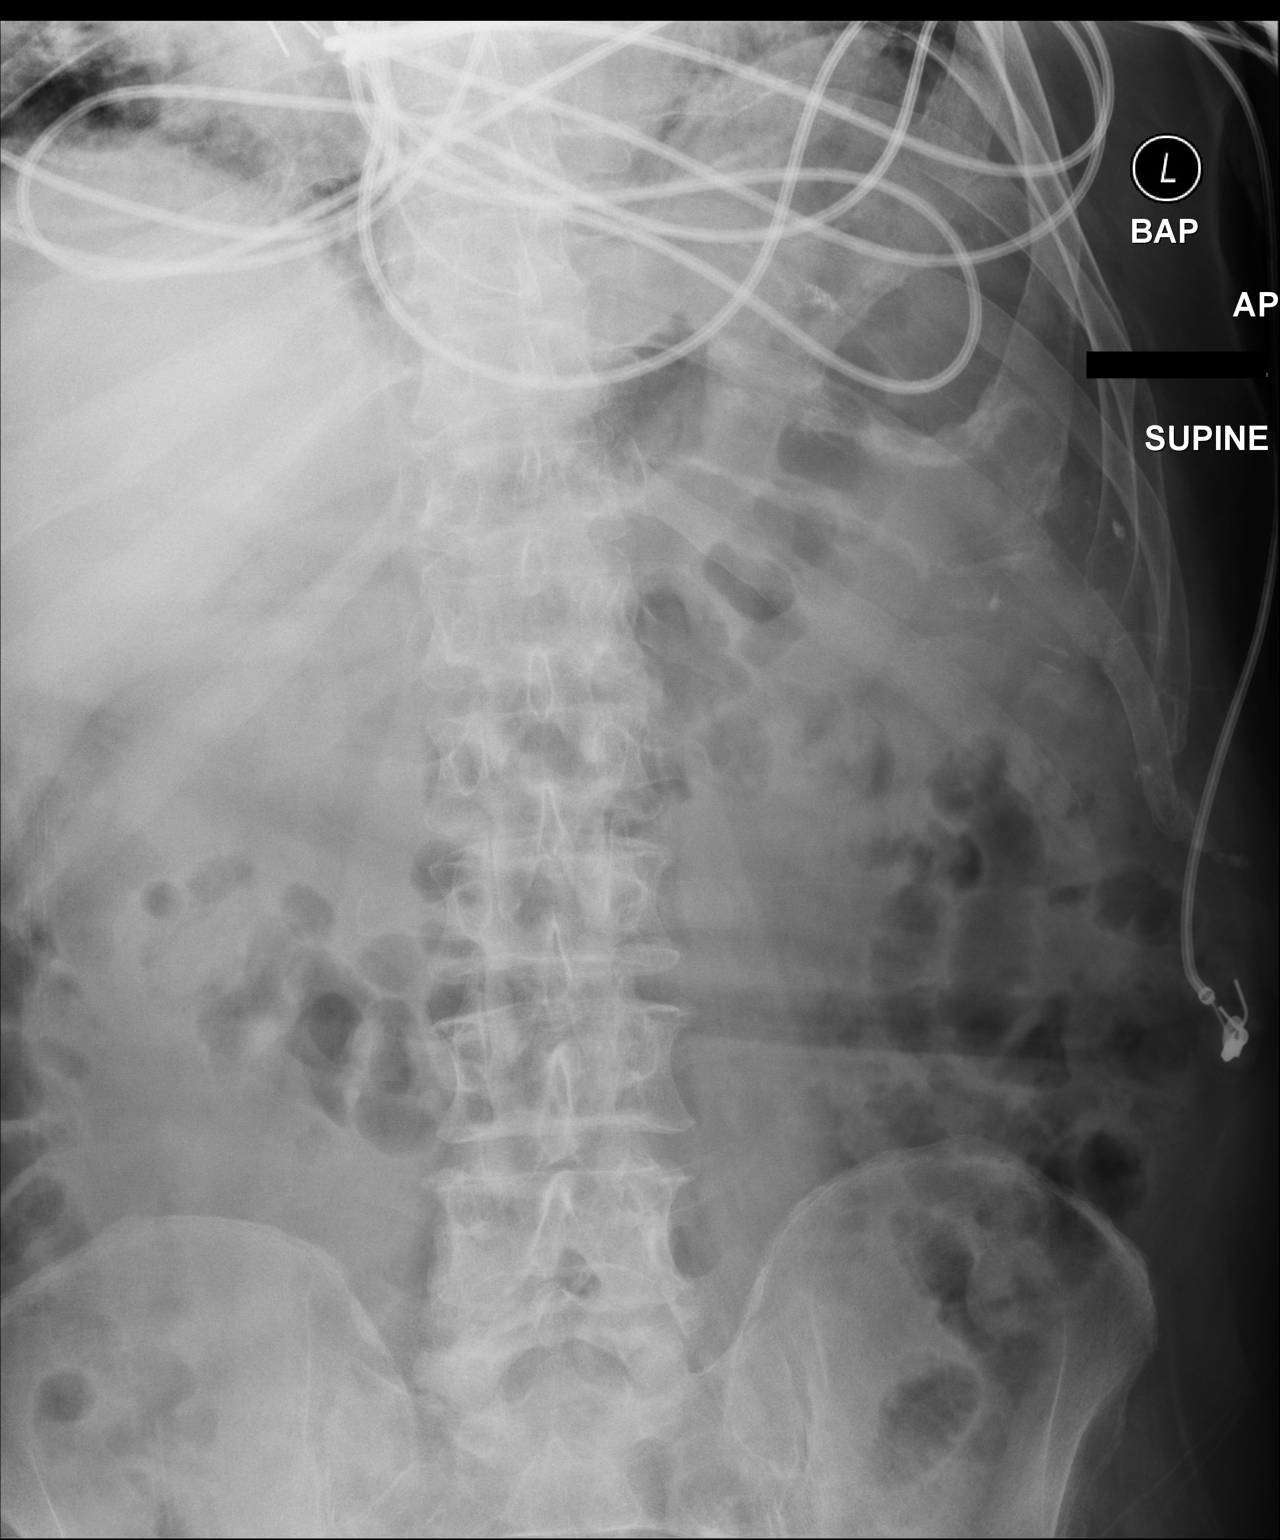

[AP (2 of 2)]
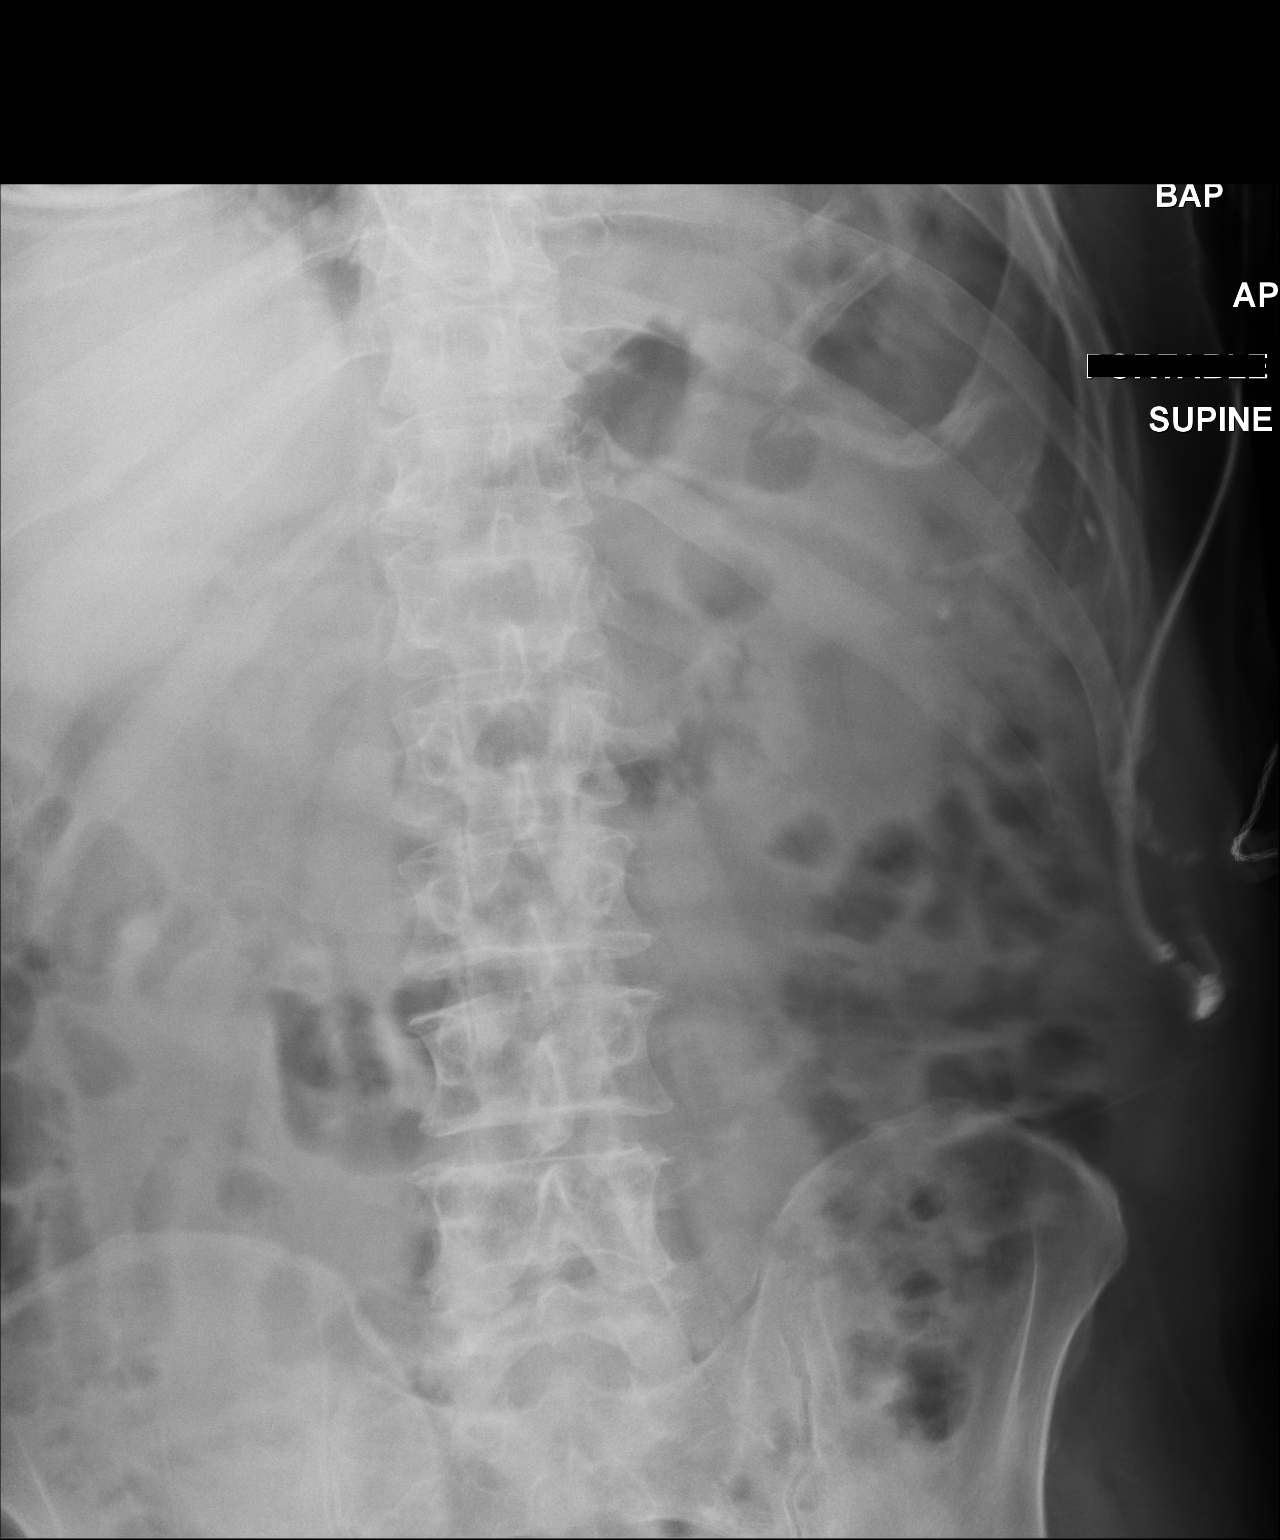

[2 of 2 positions shown; findings below may reference images not displayed]

FINDINGS: The patient's enteric tube is noted ending overlying the distal
esophagus. This could be advanced at least 10 cm.

The visualized bowel gas pattern is grossly unremarkable. No free
intra-abdominal air is seen, though evaluation for free air is
limited on a single supine view. No acute osseous abnormalities are
identified.
IMPRESSION: Enteric tube noted ending overlying the distal esophagus. This could
be advanced at least 10 cm.

## 2019-03-10 IMAGING — CR DG CHEST 1V PORT
1 series · 1 of 1 positions shown · non-contrast
Comparison: Chest radiograph performed 01/17/2017

CLINICAL DATA: Assess tracheostomy tube position. Initial
encounter.

EXAM:
PORTABLE CHEST 1 VIEW

[AP]
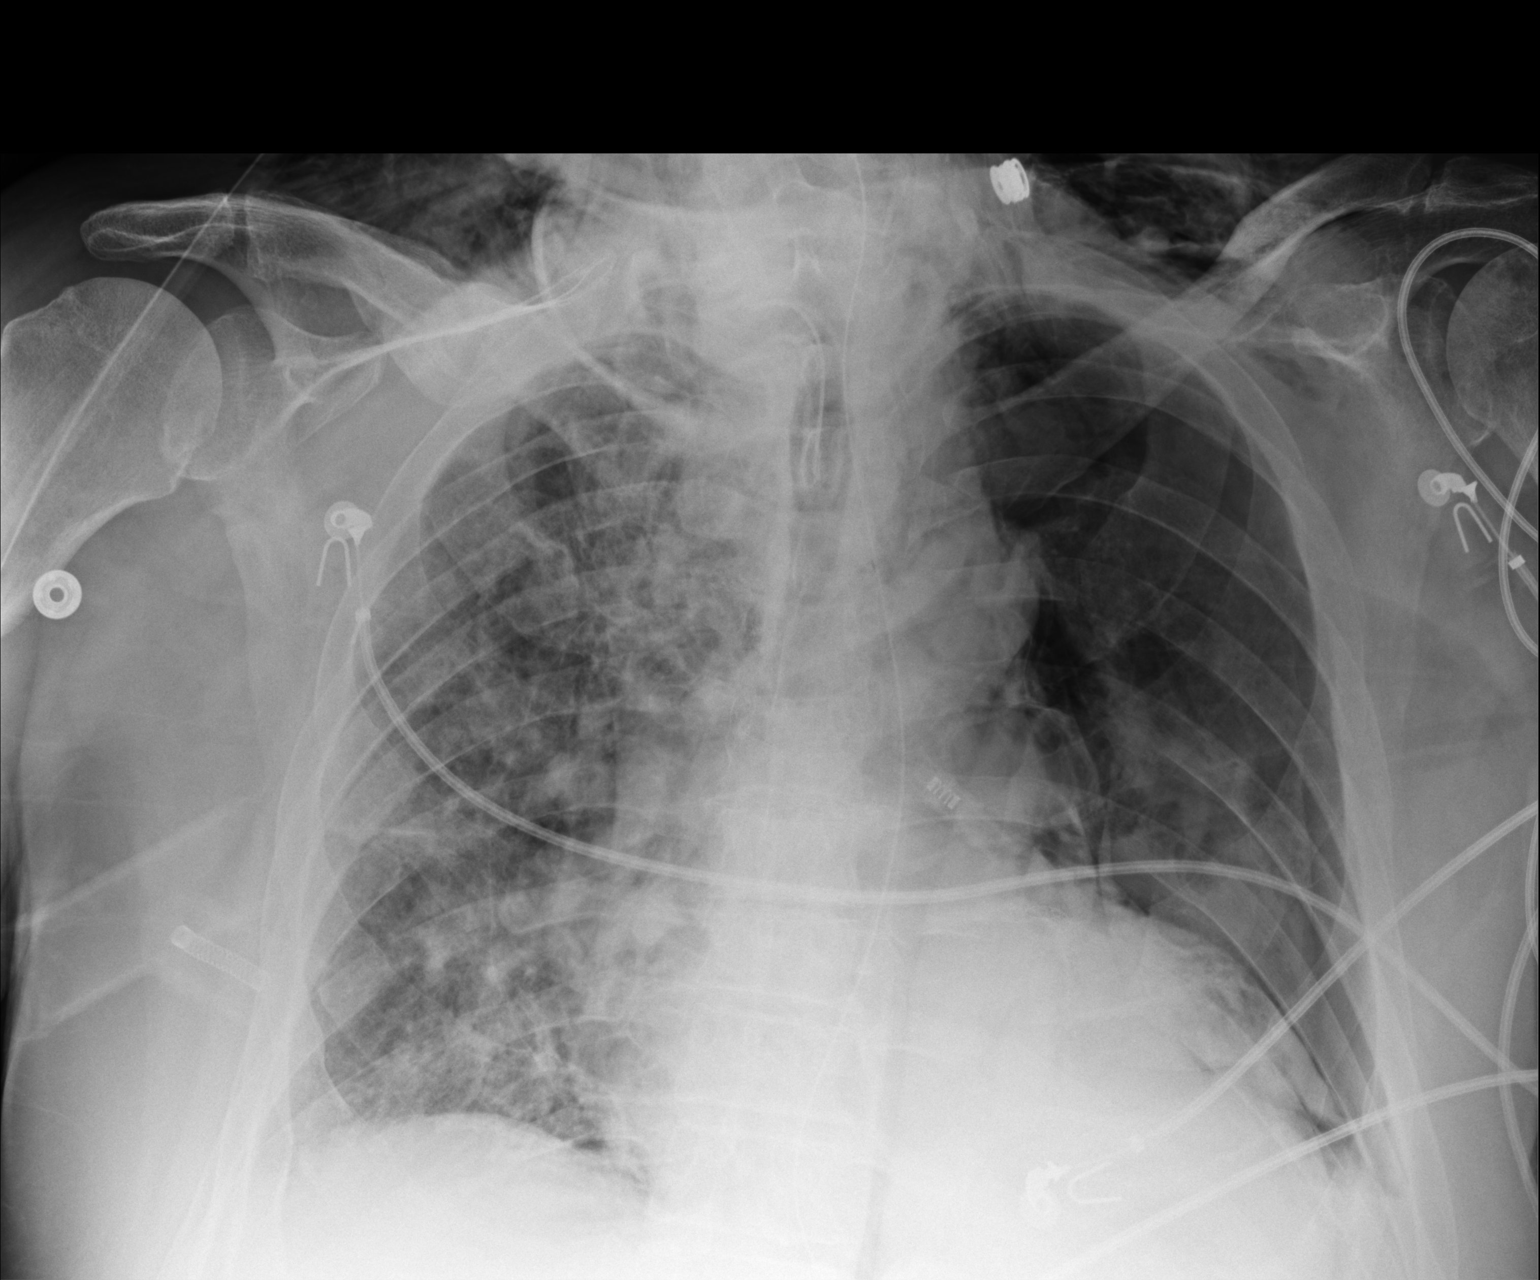

[1 of 1 positions shown; findings below may reference images not displayed]

FINDINGS: The tracheostomy tube is noted ending 5-6 cm above the carina.

There is a large amount of new soft tissue air tracking about the
neck and left chest wall.

An associated moderate to large left-sided pneumothorax is seen,
likely measuring at least 40-50% of left lung volume.

New patchy right-sided airspace opacity may reflect interstitial
edema or possibly infection. No pleural effusion is seen.

The cardiomediastinal silhouette is borderline normal in size. No
acute osseous abnormalities are identified. An enteric tube is noted
extending below the diaphragm.
IMPRESSION: 1. Moderate to large left-sided pneumothorax, likely measuring at
least 40-50% of left lung volume, new from the prior study.
2. Large amount of new soft tissue air tracking about the neck and
left chest wall. Given recent replacement of tracheostomy tube, the
pneumothorax and soft tissue air likely reflects extension of air
from the level of the tracheostomy.
3. New patchy right-sided airspace opacity may reflect interstitial
edema or possibly infection.

Critical Value/emergent results were called by telephone at the time
of interpretation on 01/22/2017 at [DATE] to Alak[place] Mnm at [[id number]], who verbally acknowledged these results.

## 2019-03-11 IMAGING — CR DG CHEST 1V PORT
1 series · 1 of 1 positions shown · non-contrast
Comparison: Chest radiograph performed 01/21/2017

CLINICAL DATA: Left-sided chest tube placement.  Initial encounter.

EXAM:
PORTABLE CHEST 1 VIEW

[AP]
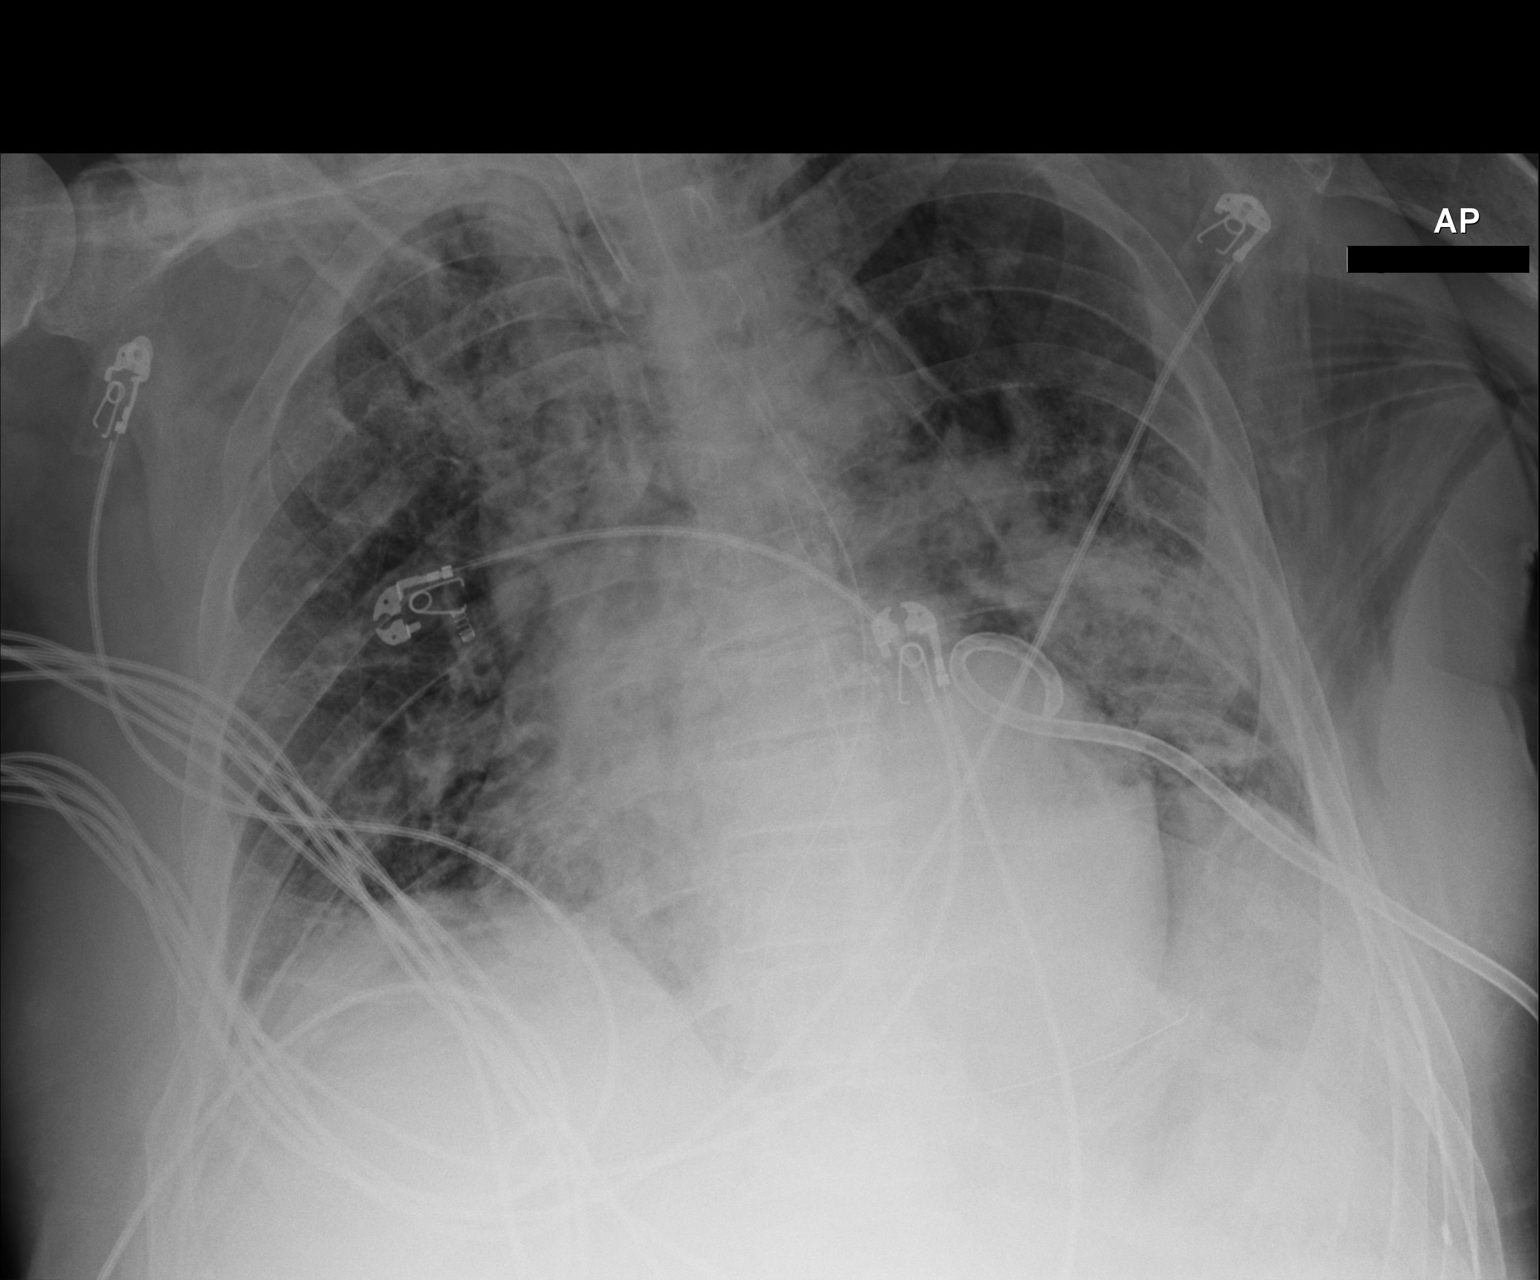

[1 of 1 positions shown; findings below may reference images not displayed]

FINDINGS: There has been interval placement of a left basilar chest tube, with
apparent mild interval improvement in the patient's moderate
left-sided pneumothorax. However, there appears to be increased
prominence of air surrounding the mediastinum. This is thought most
likely to reflect worsening pneumomediastinum due to air tracking
from the patient's tracheostomy tube.

Worsening soft tissue air is seen along the left chest wall, and
diffuse soft tissue air is again seen tracking about the neck.

Patchy bilateral airspace opacity, more prominent at the left lung
base, raises concern for multifocal pneumonia. No pleural effusion
is seen.

The cardiomediastinal silhouette is borderline enlarged. No acute
osseous abnormalities are identified.
IMPRESSION: 1. Interval placement of left basilar chest tube, with apparent mild
interval improvement in the patient's moderate left-sided
pneumothorax.
2. Increased prominence of air surrounding the mediastinum. This is
thought most likely to reflect worsening pneumomediastinum due to
air dissecting from the patient's tracheostomy tube.
3. Worsening soft tissue air along the left chest wall. Diffuse soft
tissue air again seen tracking about the neck.
4. Patchy bilateral airspace opacity, particularly at the left lung
base, raises concern for multifocal pneumonia, more prominent than
on the prior studies.
5. Borderline cardiomegaly.

Critical Value/emergent results were called by telephone at the time
of interpretation on 01/22/2017 at [DATE] to Dr. SANDRIUX GAHER, who
verbally acknowledged these results.

## 2019-03-12 IMAGING — CR DG CHEST 1V PORT
1 series · 1 of 1 positions shown · non-contrast
Comparison: January 22, 2017

CLINICAL DATA: Respiratory failure with chest tube present

EXAM:
PORTABLE CHEST 1 VIEW

[AP]
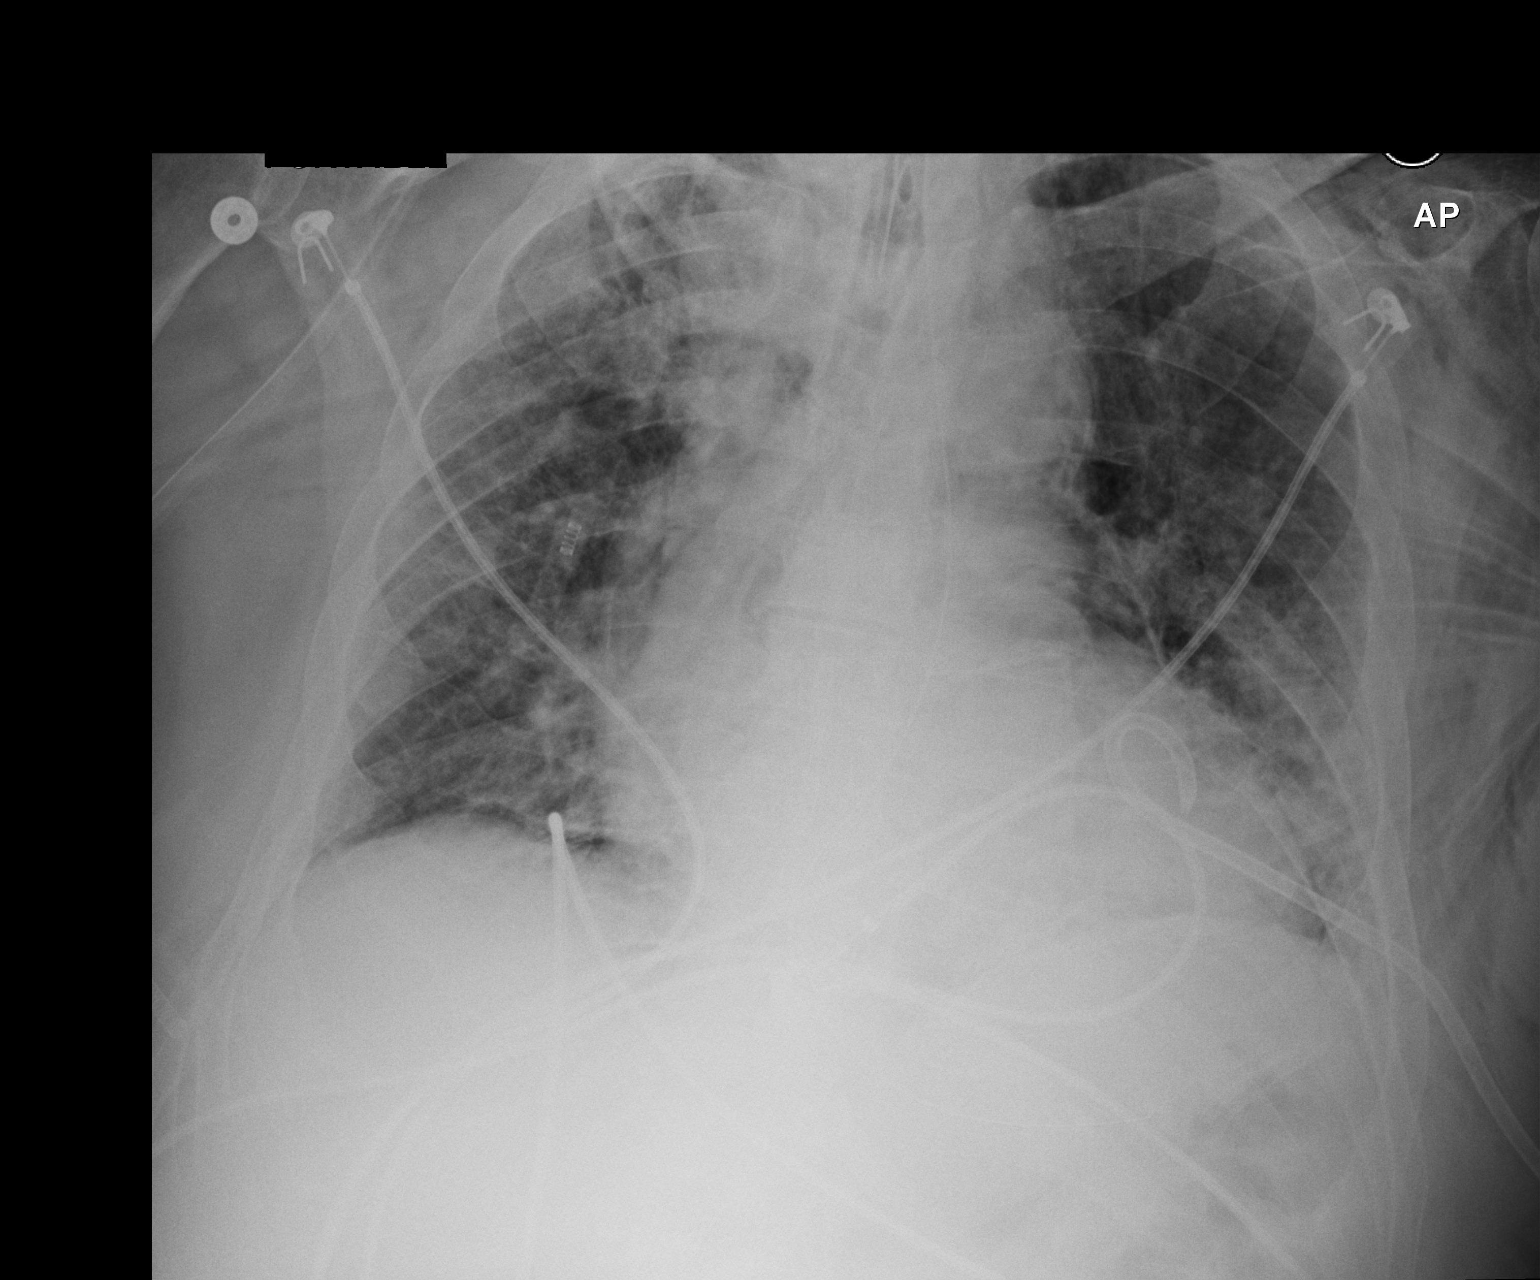

[1 of 1 positions shown; findings below may reference images not displayed]

FINDINGS: Tracheostomy catheter tip is 5.5 cm above the carina. Nasogastric
tube tip and side port are in the stomach. There is a chest tube on
the left. Left-sided pneumothorax is slightly smaller. No tension
component. There is extensive subcutaneous air. There is also fairly
extensive pneumomediastinum.

There is underlying interstitial edema. There has been partial
clearing of airspace opacity in the left mid lower lung zones
compared to 1 day prior. No new opacity. Heart is mildly enlarged
with pulmonary vascularity within normal limits. No adenopathy.
There is an old healed fracture of the right clavicle.
IMPRESSION: Left chest tube remains in place with left sided pneumothorax
slightly smaller. Extensive pneumomediastinum remains. Other tube
positions are unchanged. Partial but incomplete clearing of airspace
consolidation the left mid and lower lung zones. There are areas of
patchy atelectasis bilaterally as well as mild interstitial edema.
Stable cardiomegaly.

## 2019-03-13 IMAGING — DX DG ABD PORTABLE 1V
1 series · 1 of 1 positions shown · non-contrast
Comparison: 01/24/2017

CLINICAL DATA: NG tube placement

EXAM:
PORTABLE ABDOMEN - 1 VIEW

[abdomen kub]
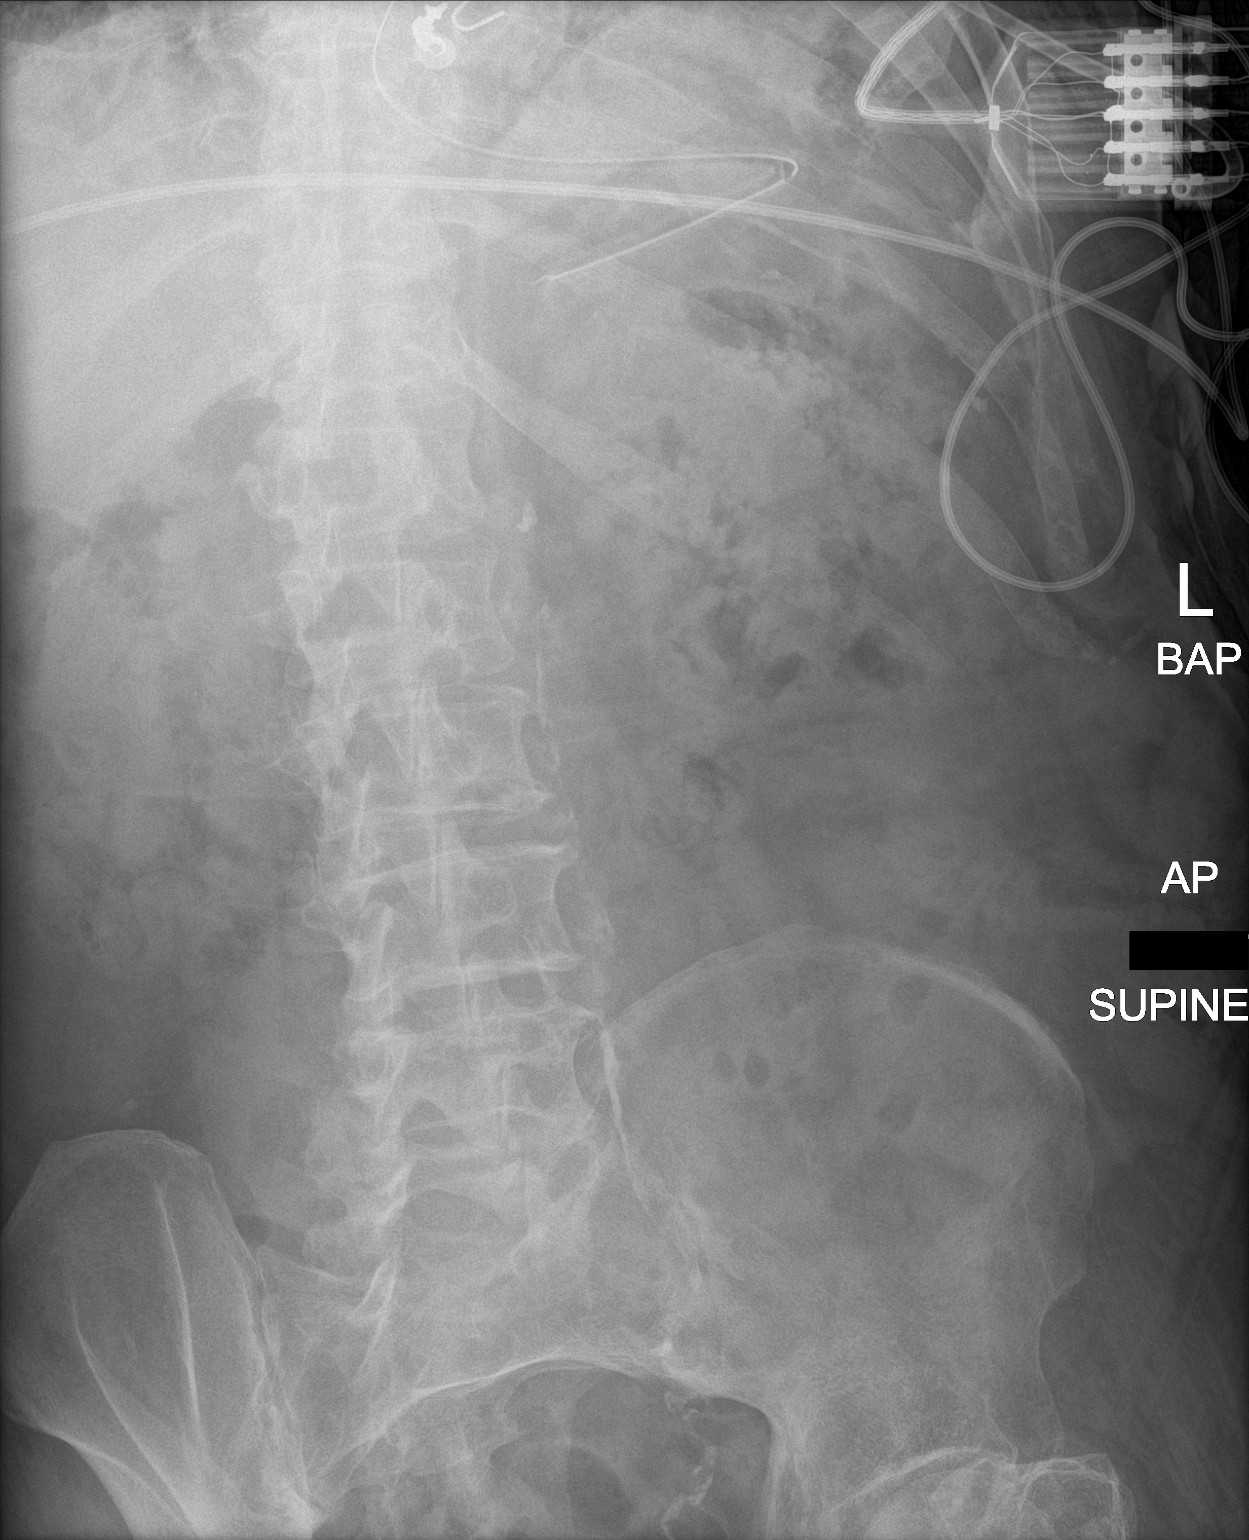

[1 of 1 positions shown; findings below may reference images not displayed]

FINDINGS: Esophageal tube tip and side-port project over the proximal stomach.
Gas pattern nonobstructed. Aortic atherosclerosis
IMPRESSION: Esophageal tube tip and side-port project over the proximal stomach

## 2019-03-13 IMAGING — CR DG ABD PORTABLE 1V
1 series · 1 of 1 positions shown · non-contrast
Comparison: CXR from earlier on the same day. Abdomen radiographs
from [DATE] 8 seen.

CLINICAL DATA: NG tube placement

EXAM:
PORTABLE ABDOMEN - 1 VIEW

[AP]
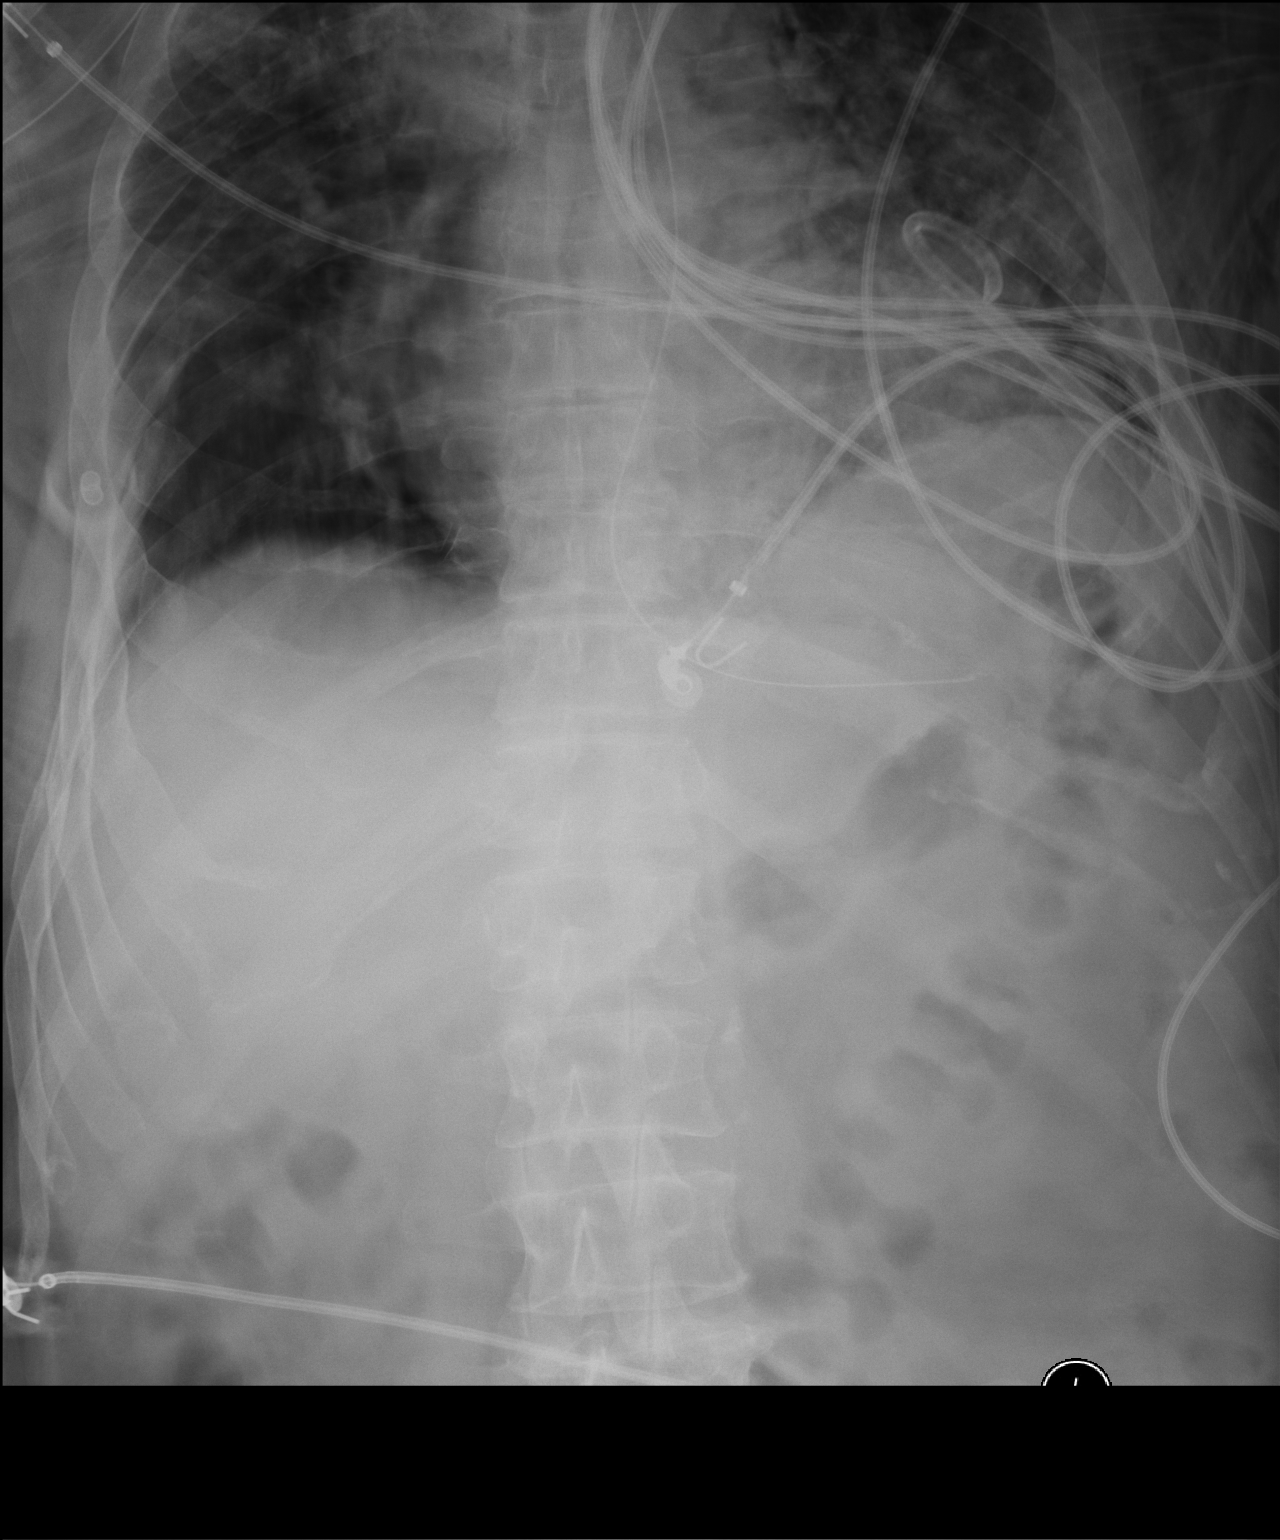

[1 of 1 positions shown; findings below may reference images not displayed]

FINDINGS: Stable cardiomegaly with patchy airspace opacities in the right
upper lobe, left mid lung and left lung base. Gastric tube extends
below the left hemidiaphragm with tip and side-port in the region of
the gastric fundus and body. Left-sided chest tube projects over the
left lung base with subcutaneous emphysema noted bilaterally.
Degenerative change noted of the mid thoracic spine.
IMPRESSION: Gastric tube extends into the fundus and body of the stomach. Stable
cardiomegaly with left-sided chest tube in place. Patchy airspace
opacities in the visualized right upper lobe and left lung base.
Subcutaneous emphysema is seen bilaterally.

## 2019-03-13 IMAGING — DX DG ABD PORTABLE 1V
1 series · 1 of 1 positions shown · non-contrast
Comparison: Earlier today.

CLINICAL DATA: Nasogastric tube placement.

EXAM:
PORTABLE ABDOMEN - 1 VIEW

[abdomen kub]
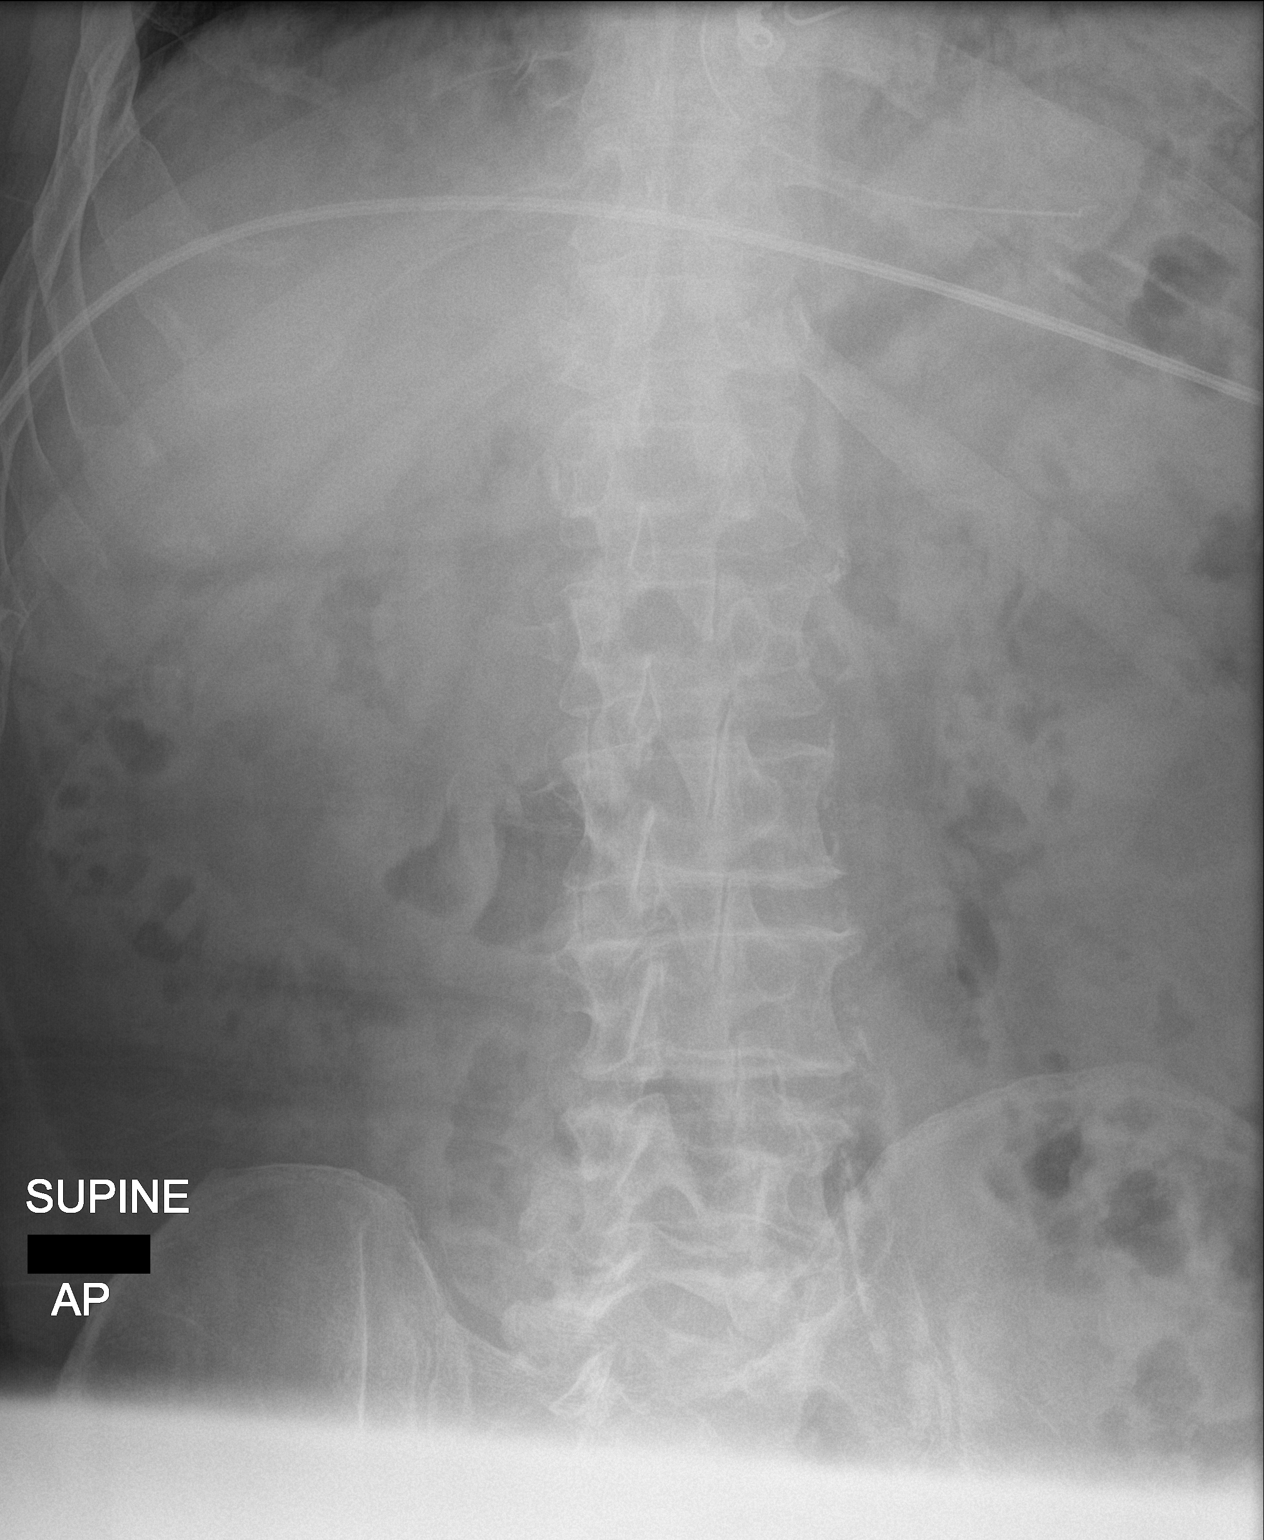

[1 of 1 positions shown; findings below may reference images not displayed]

FINDINGS: Normal bowel gas pattern. Nasogastric tube tip in the proximal
stomach and side hole in the region of the gastroesophageal
junction. Atheromatous arterial calcifications, including the
abdominal aorta. Mild lumbar spine degenerative changes. Increased
patchy density at the left lung base.
IMPRESSION: 1. Nasogastric tube tip in the proximal stomach and side hole in the
region of the gastroesophageal junction. It is recommended that this
be advanced farther into the stomach.
2. Aortic atherosclerosis.
3. Increased patchy atelectasis or pneumonia at the left lung base.

## 2019-03-15 IMAGING — CR DG ABD PORTABLE 1V
1 series · 1 of 1 positions shown · non-contrast
Comparison: Portable exam 7326 hours compared to 01/24/2017

CLINICAL DATA: Nasogastric tube placement

EXAM:
PORTABLE ABDOMEN - 1 VIEW

[AP]
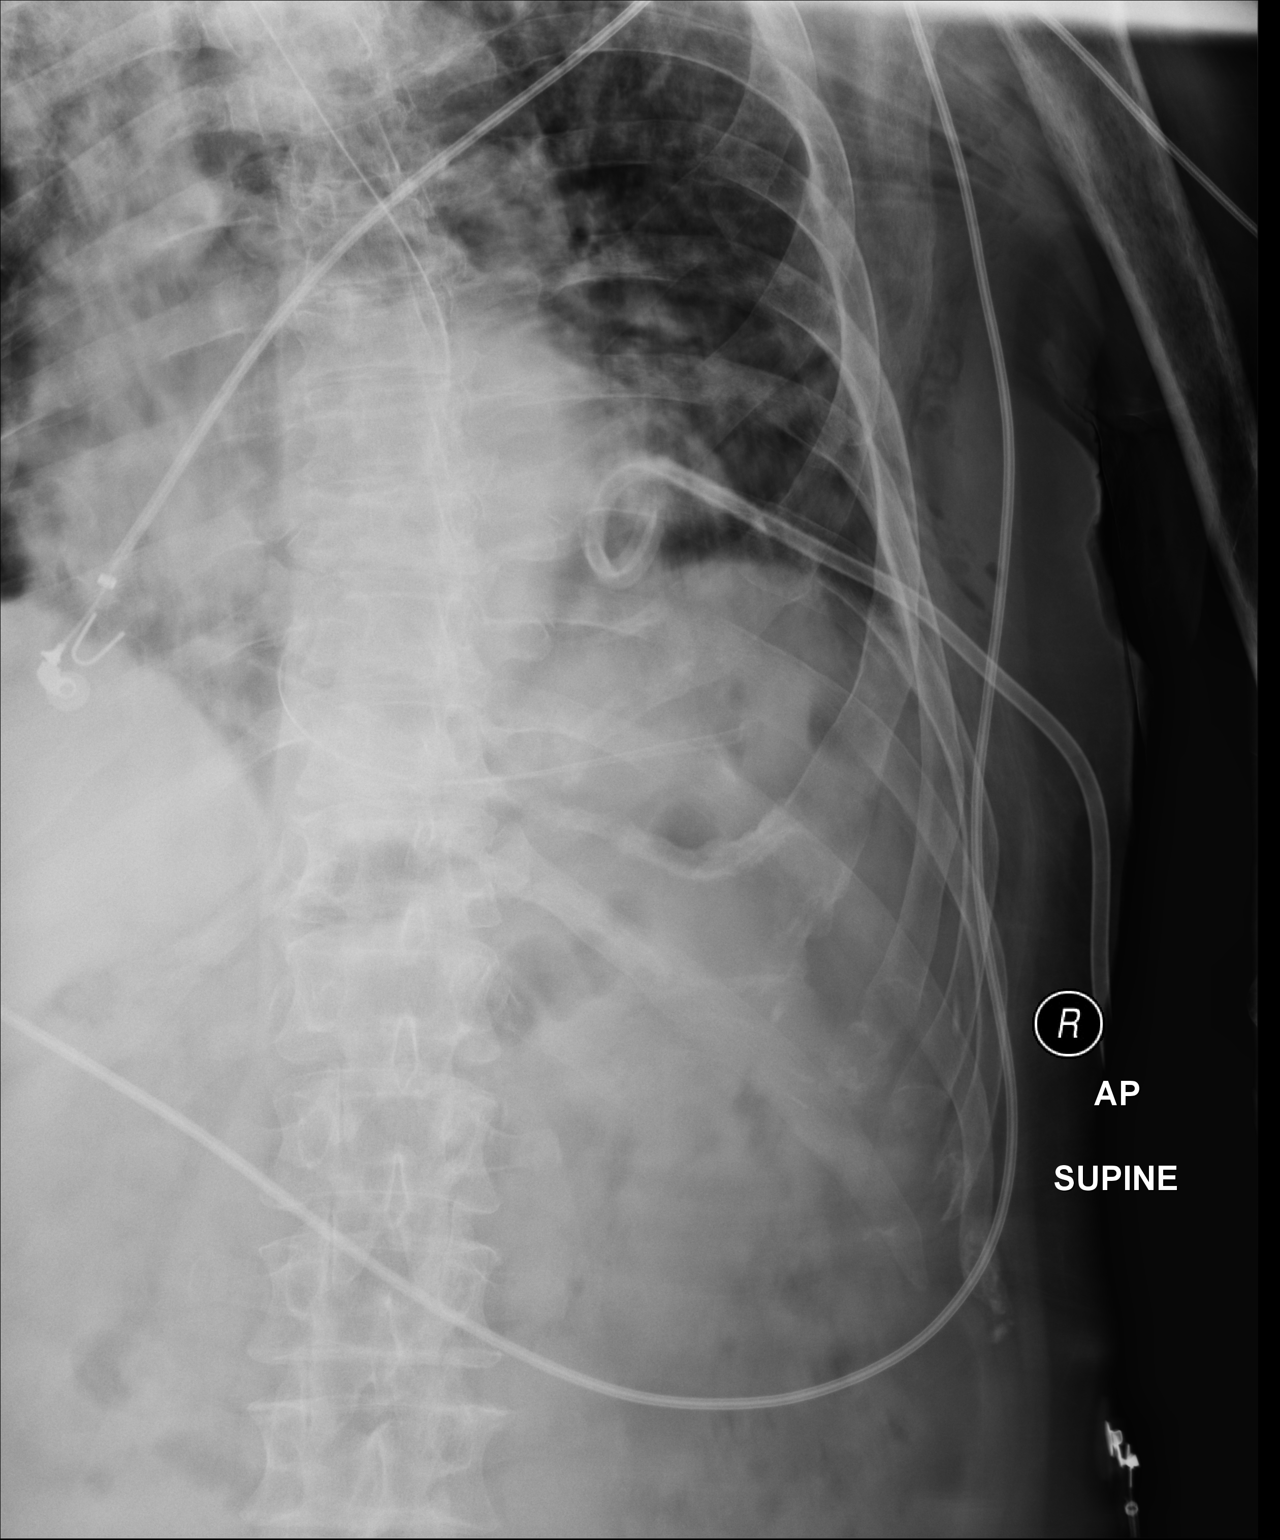

[1 of 1 positions shown; findings below may reference images not displayed]

FINDINGS: Pigtail drainage catheter at inferior LEFT hemithorax.

Nasogastric tube extends into proximal stomach.

Visualized bowel gas pattern normal.

Bones demineralized.
IMPRESSION: Nasogastric tube tip projects over proximal stomach.

## 2019-03-15 IMAGING — CR DG ABD PORTABLE 1V
1 series · 1 of 1 positions shown · non-contrast
Comparison: 01/26/2017

CLINICAL DATA: NG tube placement

EXAM:
PORTABLE ABDOMEN - 1 VIEW

[AP]
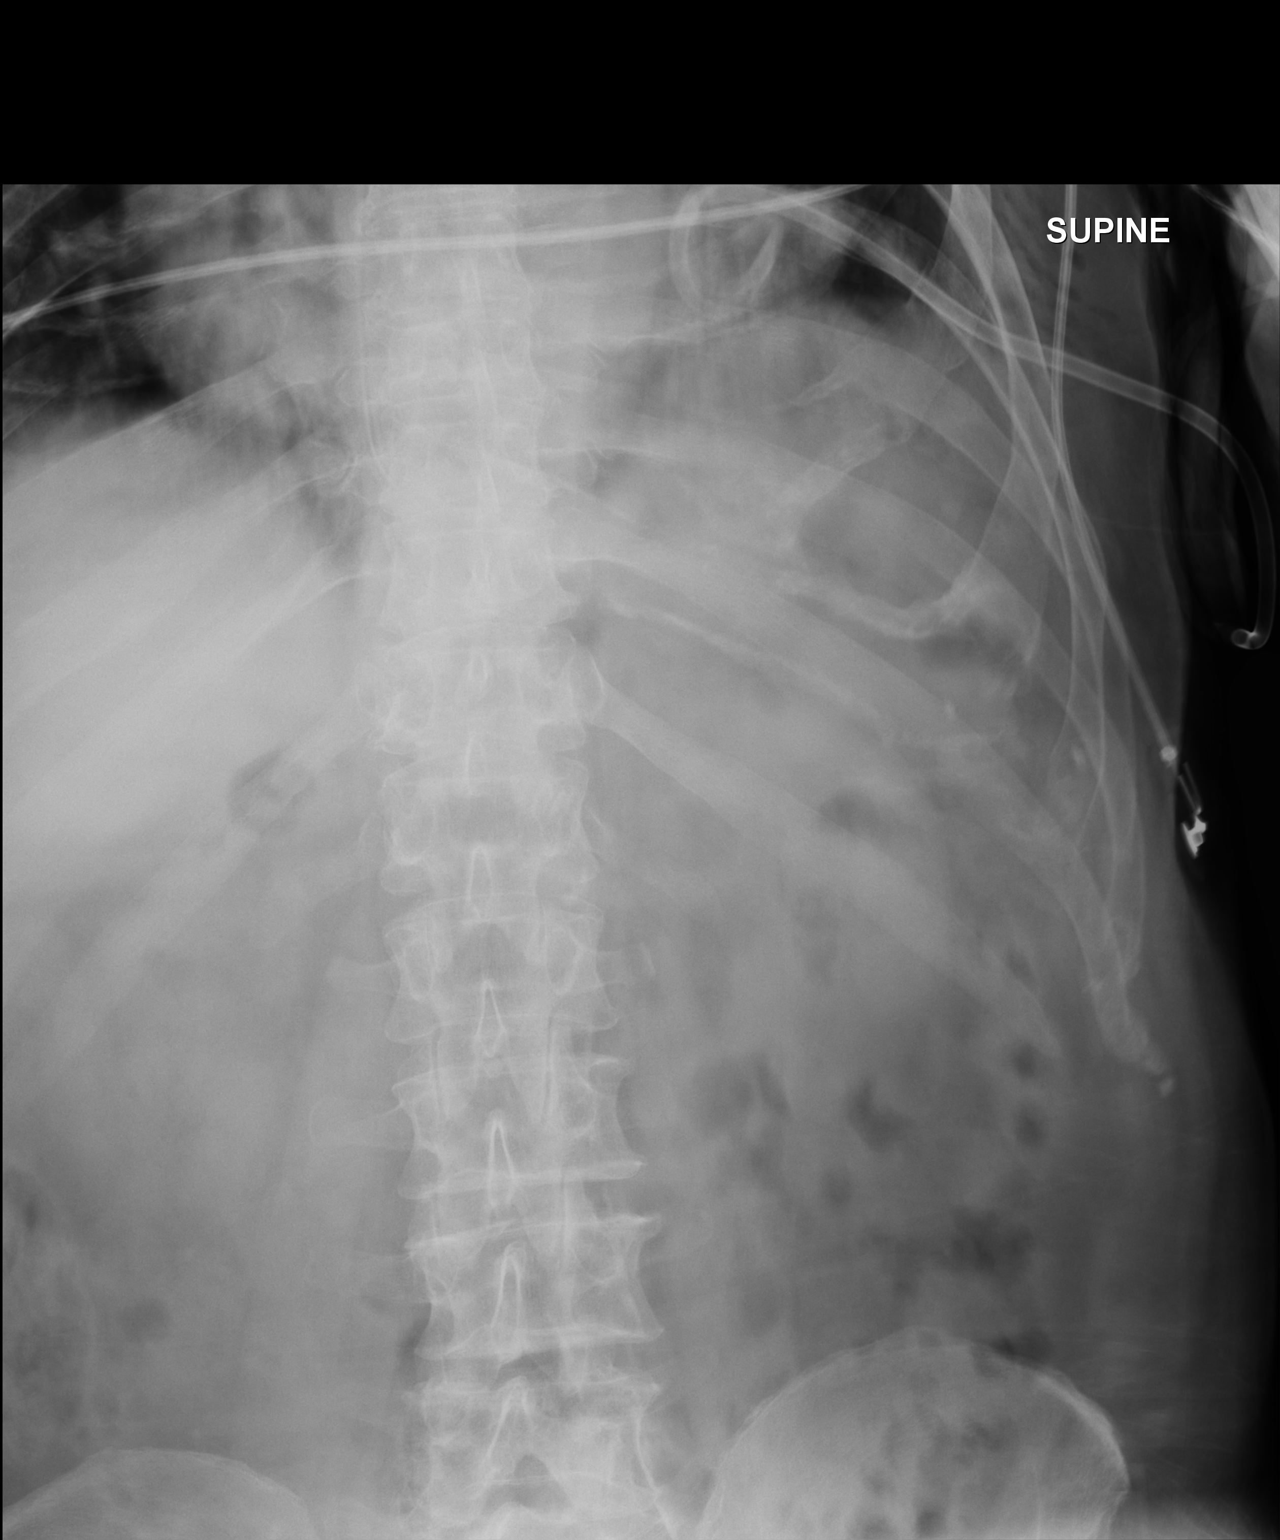

[1 of 1 positions shown; findings below may reference images not displayed]

FINDINGS: A left lower pigtail catheter is in place with adjacent small
subcutaneous emphysema.

The tip of the esophageal tube is poorly visualized, due to patient
motion. It is suspected to be in the region of distal esophagus.
Upper gas pattern grossly unremarkable.
IMPRESSION: Difficult visualization of esophageal tube due to patient motion,
suspect that the tube tip overlies the distal esophagus or GE
junction, consider further advancement.

## 2019-03-15 IMAGING — CR DG ABD PORTABLE 1V
1 series · 1 of 1 positions shown · non-contrast
Comparison: Abdominal radiograph 01/26/2017 at [DATE] p.m.

CLINICAL DATA: NG tube placement

EXAM:
PORTABLE ABDOMEN - 1 VIEW

[AP]
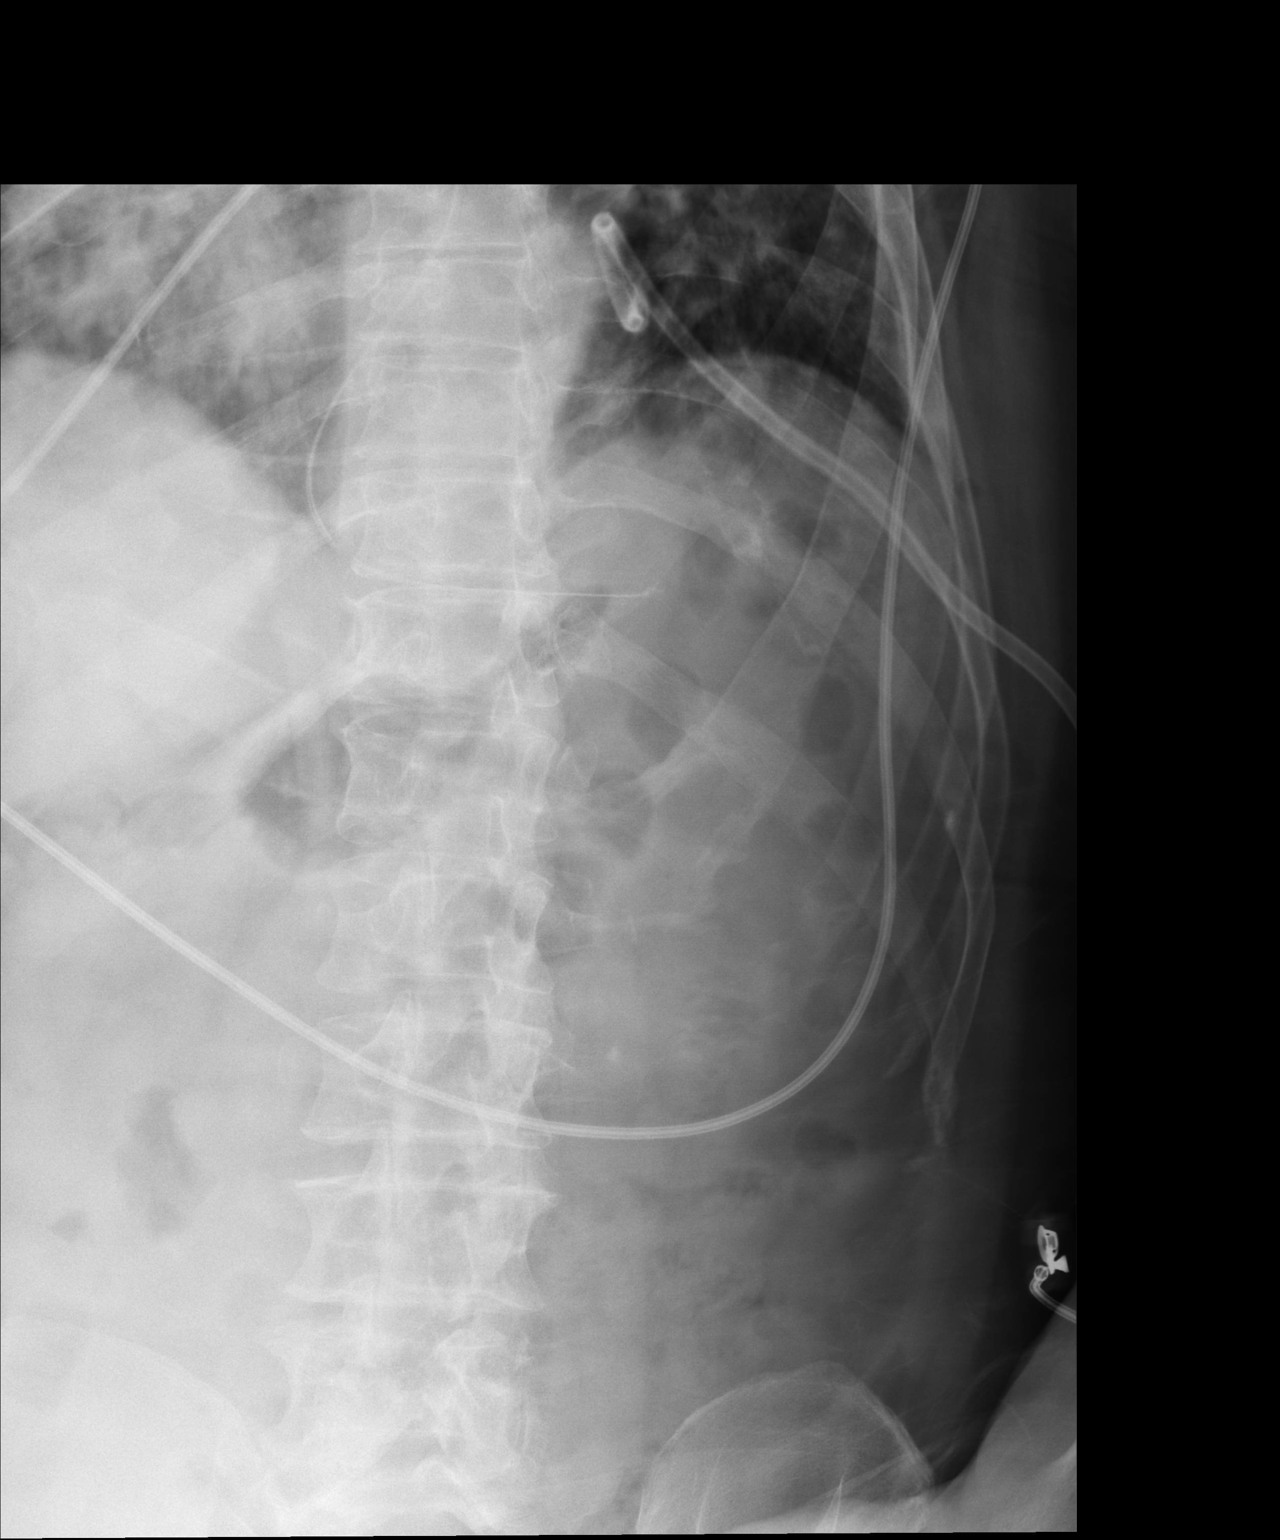

[1 of 1 positions shown; findings below may reference images not displayed]

FINDINGS: Basilar left-sided chest tube is in unchanged position. The
nasogastric tube is been advanced slightly with tip and side port
overlying the gastric body.
IMPRESSION: NG tube tip and side port overlie the gastric body.

## 2019-03-16 IMAGING — DX DG CHEST 1V PORT
1 series · 1 of 1 positions shown · non-contrast
Comparison: Chest x-rays dated 01/23/2017 and 01/22/2017.

CLINICAL DATA: Pneumothorax, chest tube.

EXAM:
PORTABLE CHEST 1 VIEW

[chest ap]
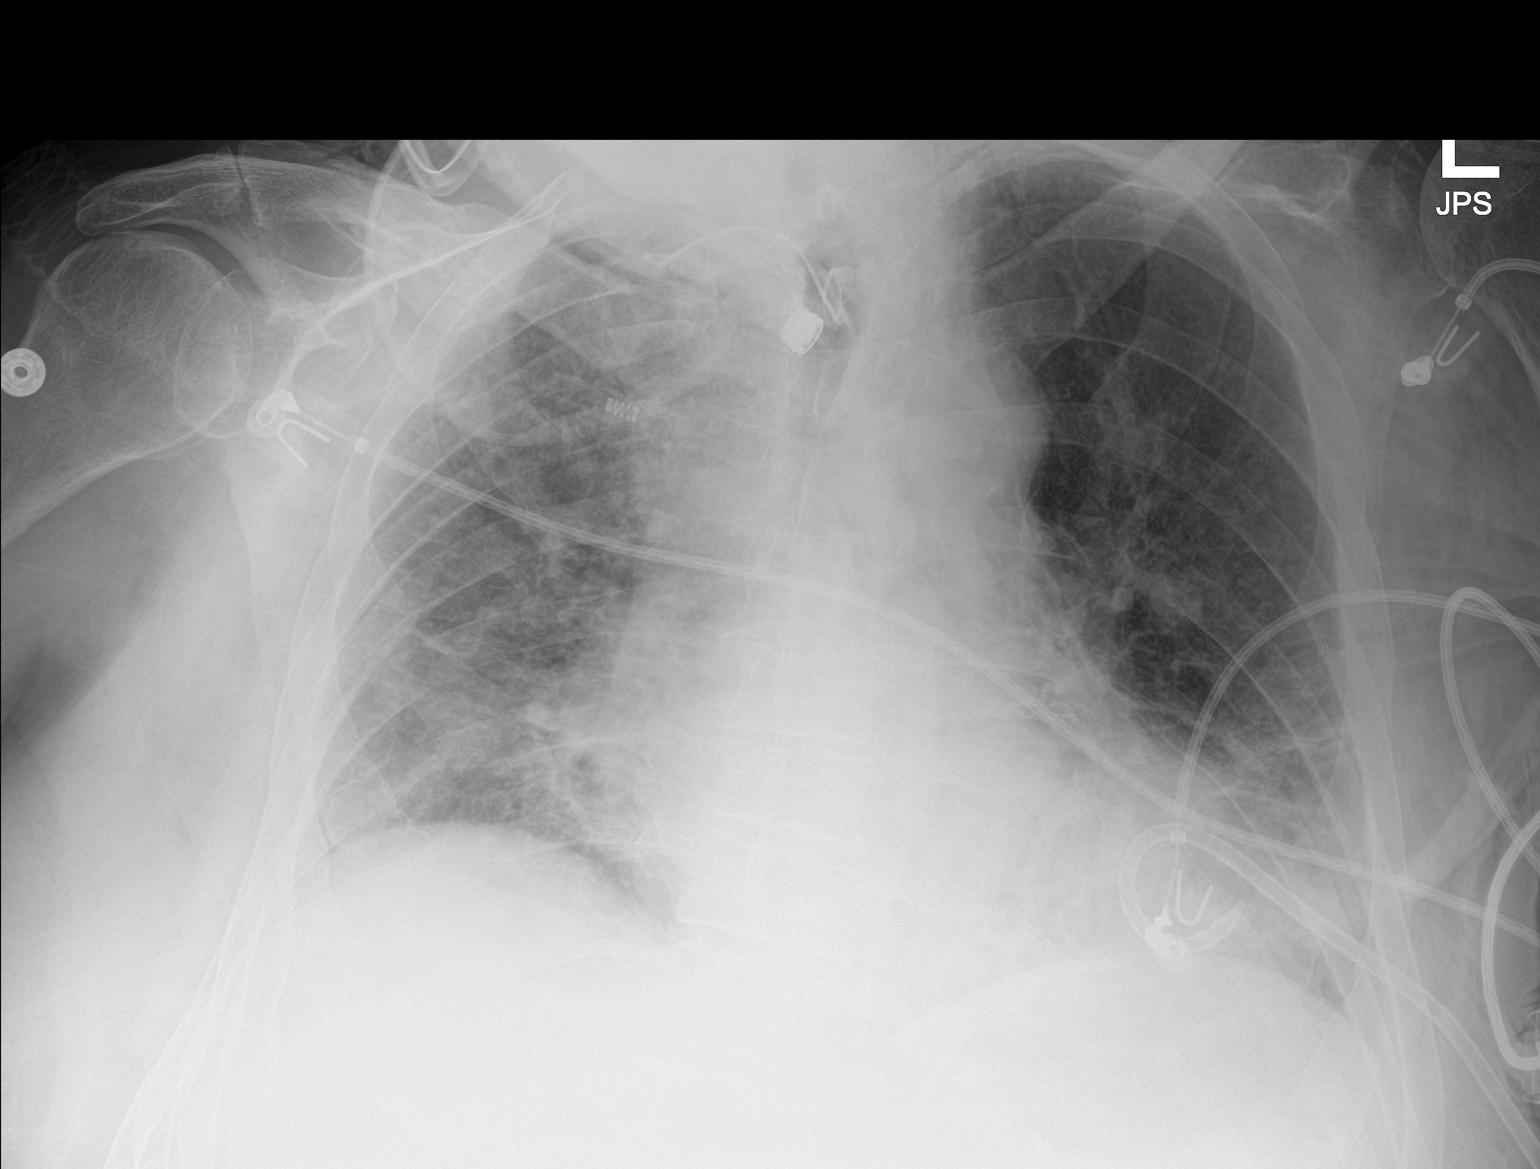

[1 of 1 positions shown; findings below may reference images not displayed]

FINDINGS: Tracheostomy tube appears grossly stable in position with tip again
approximately 5 cm above the level of the carina. Cardiomediastinal
silhouette appears stable.

The left-sided pneumothorax described on previous exams is not seen
on today's study. Chest tube again noted at the left lung base
appear Interstitial edema is stable or slightly decreased compared
to the previous exam. No new lung findings.
IMPRESSION: 1. No left-sided pneumothorax is appreciated on today's study,
apparently resolved since the previous study. Chest tube at the left
lung base appears stable in position.
2. Bilateral interstitial edema, right greater than left, is stable
or slightly decreased compared to the previous chest x-ray of
01/23/2017, suggesting improved fluid status.

## 2019-03-17 IMAGING — DX DG ABD PORTABLE 1V
2 series · 2 of 2 positions shown · non-contrast
Comparison: None.

CLINICAL DATA: 60-year-old male with a history of nasogastric tube
placement

EXAM:
PORTABLE ABDOMEN - 1 VIEW

[abdomen kub (1 of 2)]
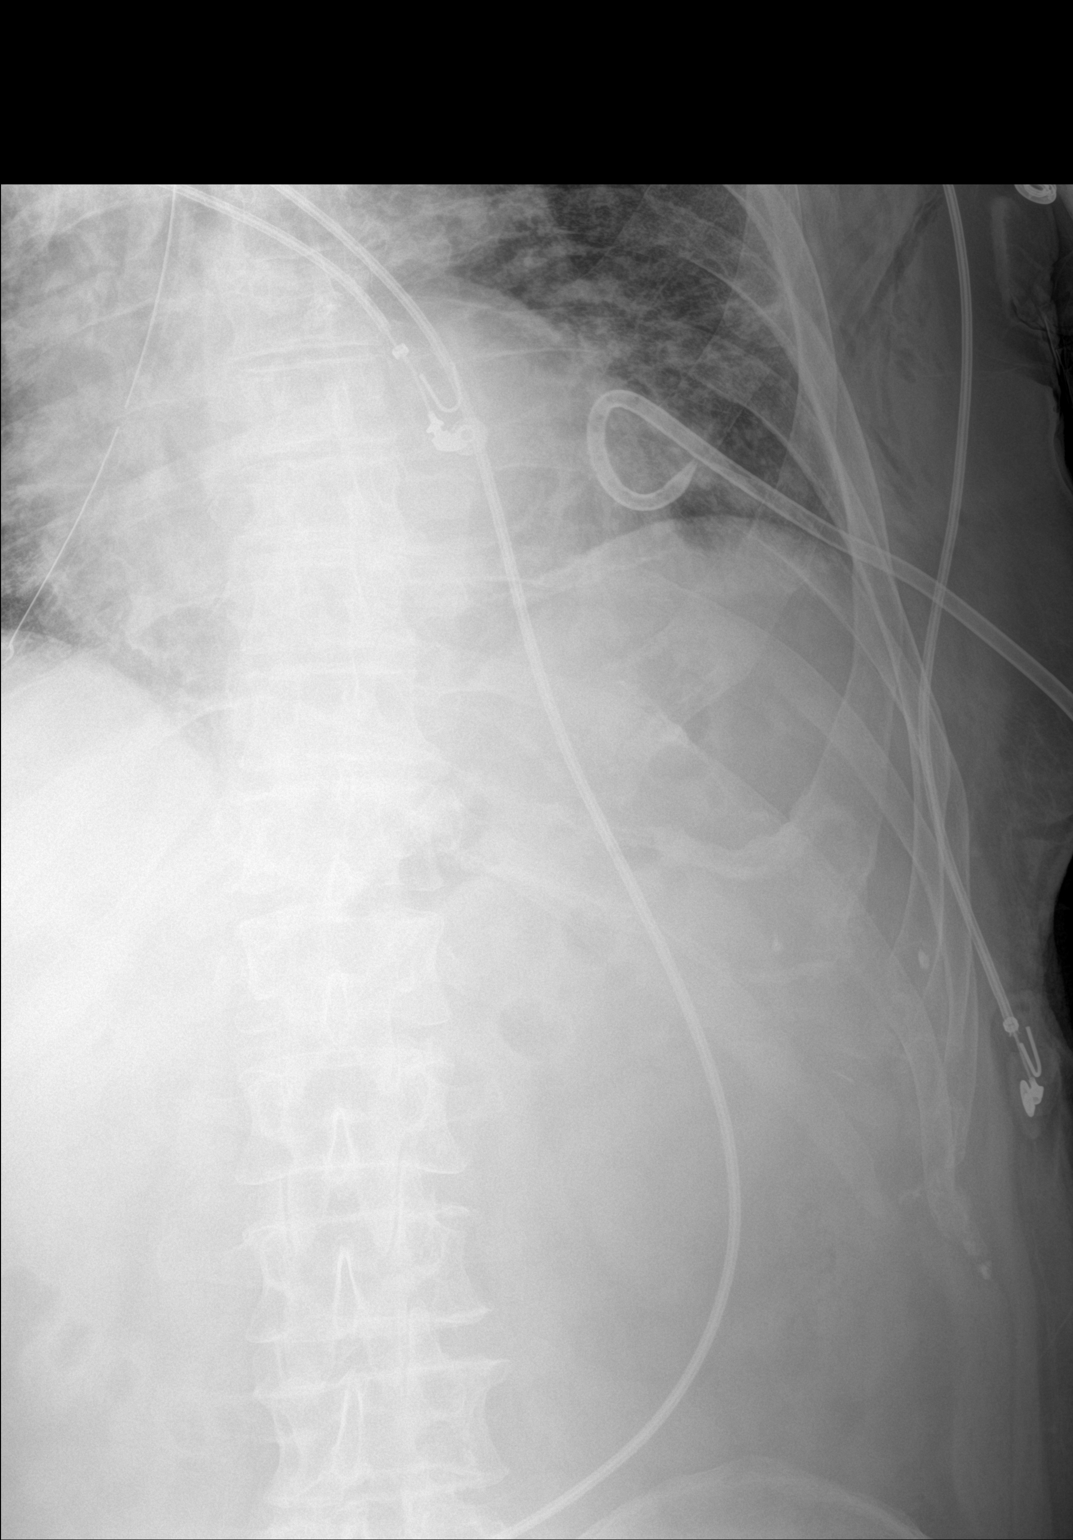

[abdomen kub (2 of 2)]
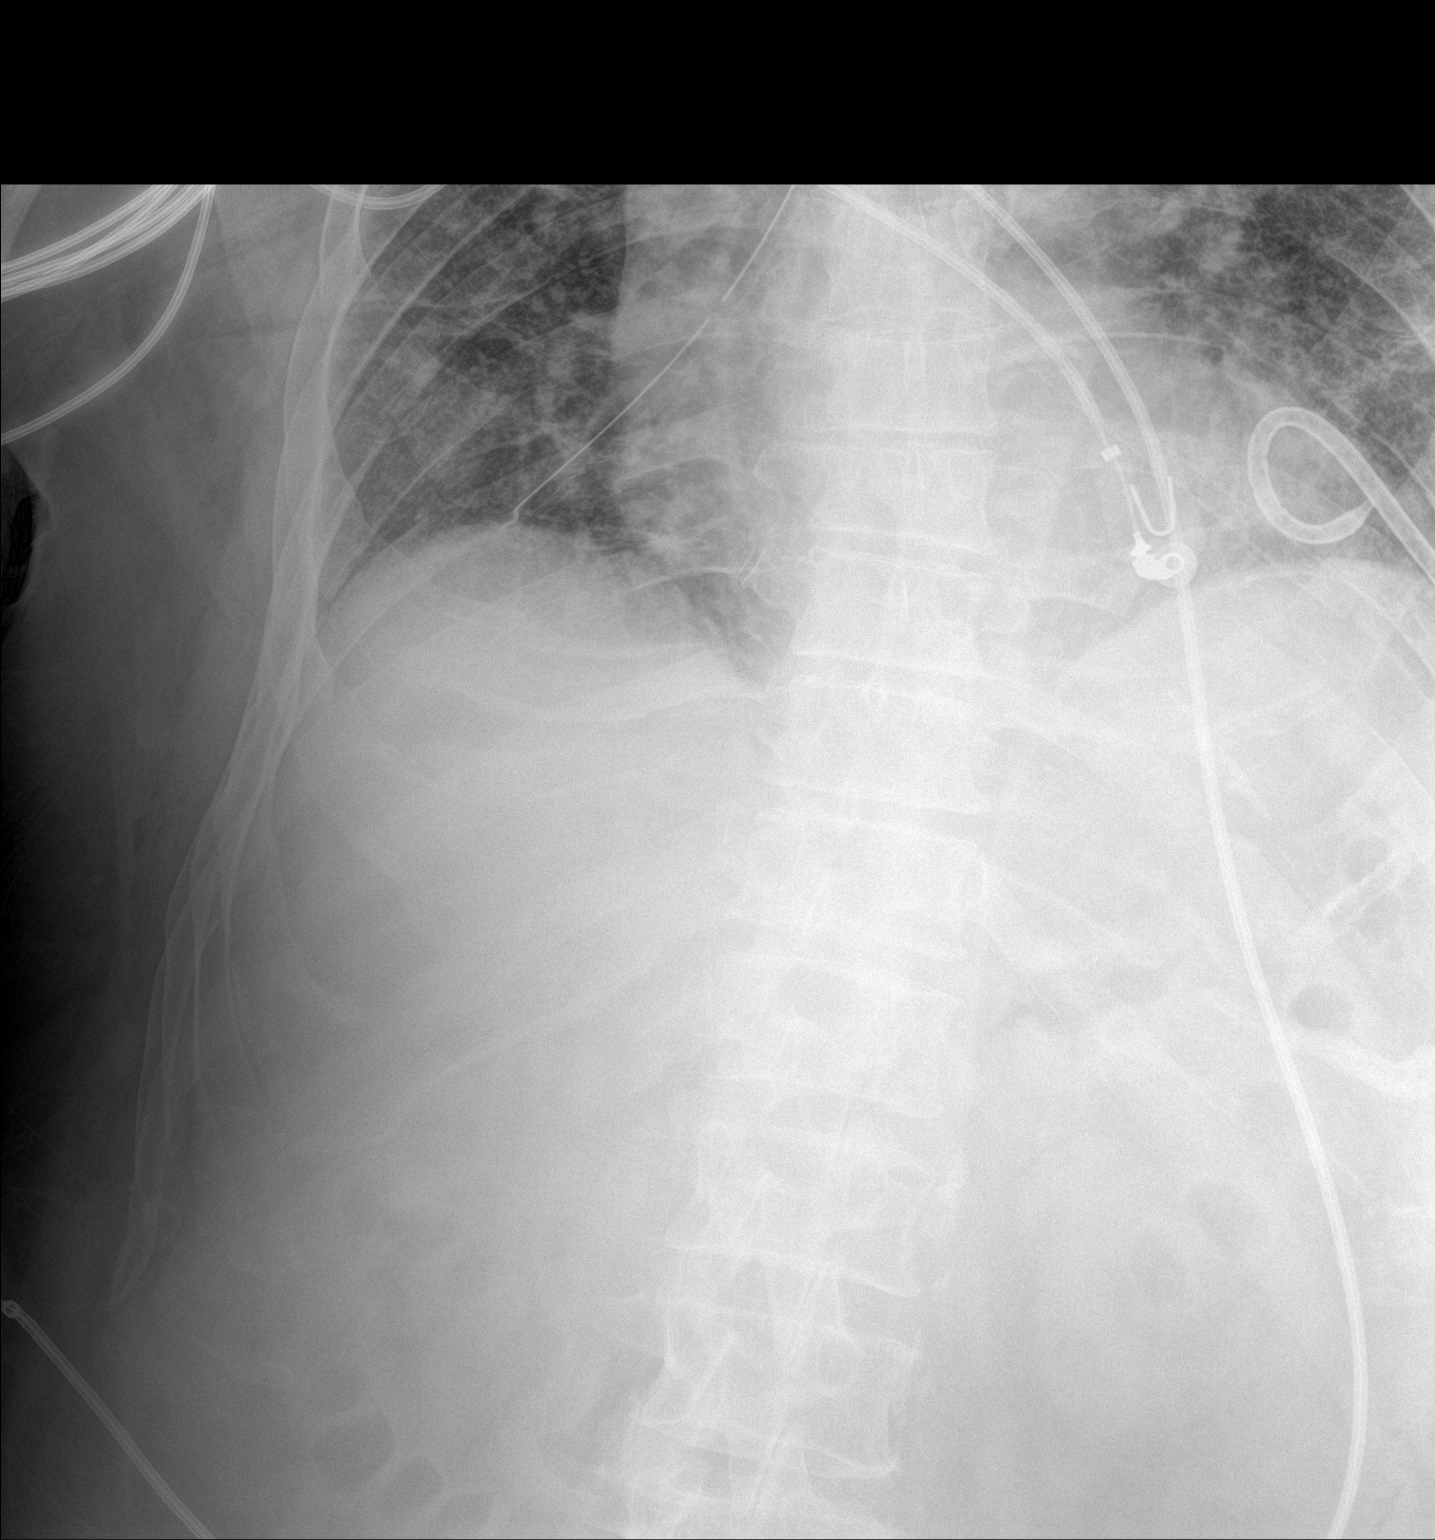

[2 of 2 positions shown; findings below may reference images not displayed]

FINDINGS: Limited plain film of the upper abdomen.

Nasogastric tube terminates above the diaphragm to the right of the
mediastinum.

Pigtail drainage catheter projects over the lower left chest,
unchanged.
IMPRESSION: Limited plain film demonstrates nasogastric tube terminating to the
right of the mediastinum above the diaphragm, likely within a
bronchus/airway.

These results were called by telephone at the time of interpretation
on 01/28/2017 at [DATE] to nurse caring for the patient, Ms Felicitas
Laddaga, who verbally acknowledged these results. They have confirmed
removal before the phone call.

## 2019-03-17 IMAGING — DX DG CHEST 1V PORT
2 series · 2 of 2 positions shown · non-contrast
Comparison: Chest x-rays dated 01/27/2017, 01/23/2017 and
01/22/2017.

CLINICAL DATA: Pneumothorax.

EXAM:
PORTABLE CHEST 1 VIEW

[chest ap (1 of 2)]
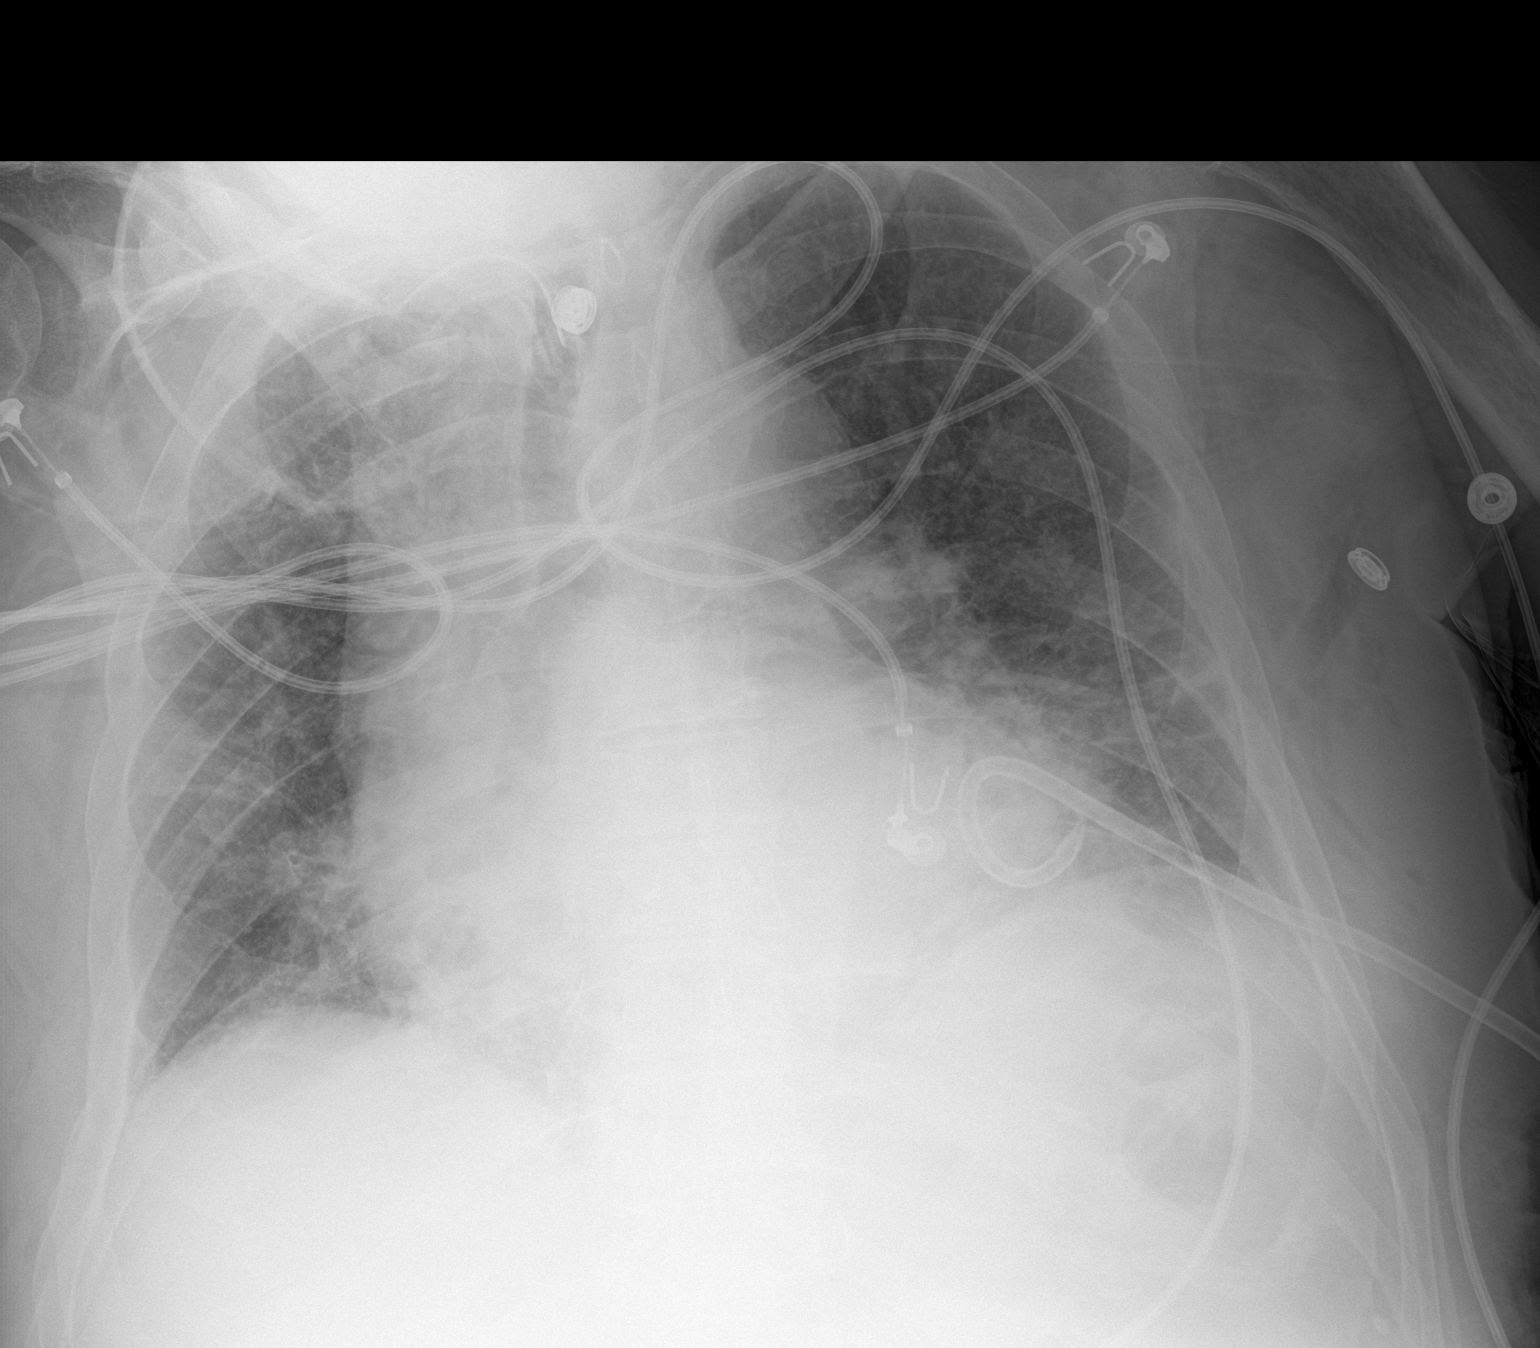

[chest ap (2 of 2)]
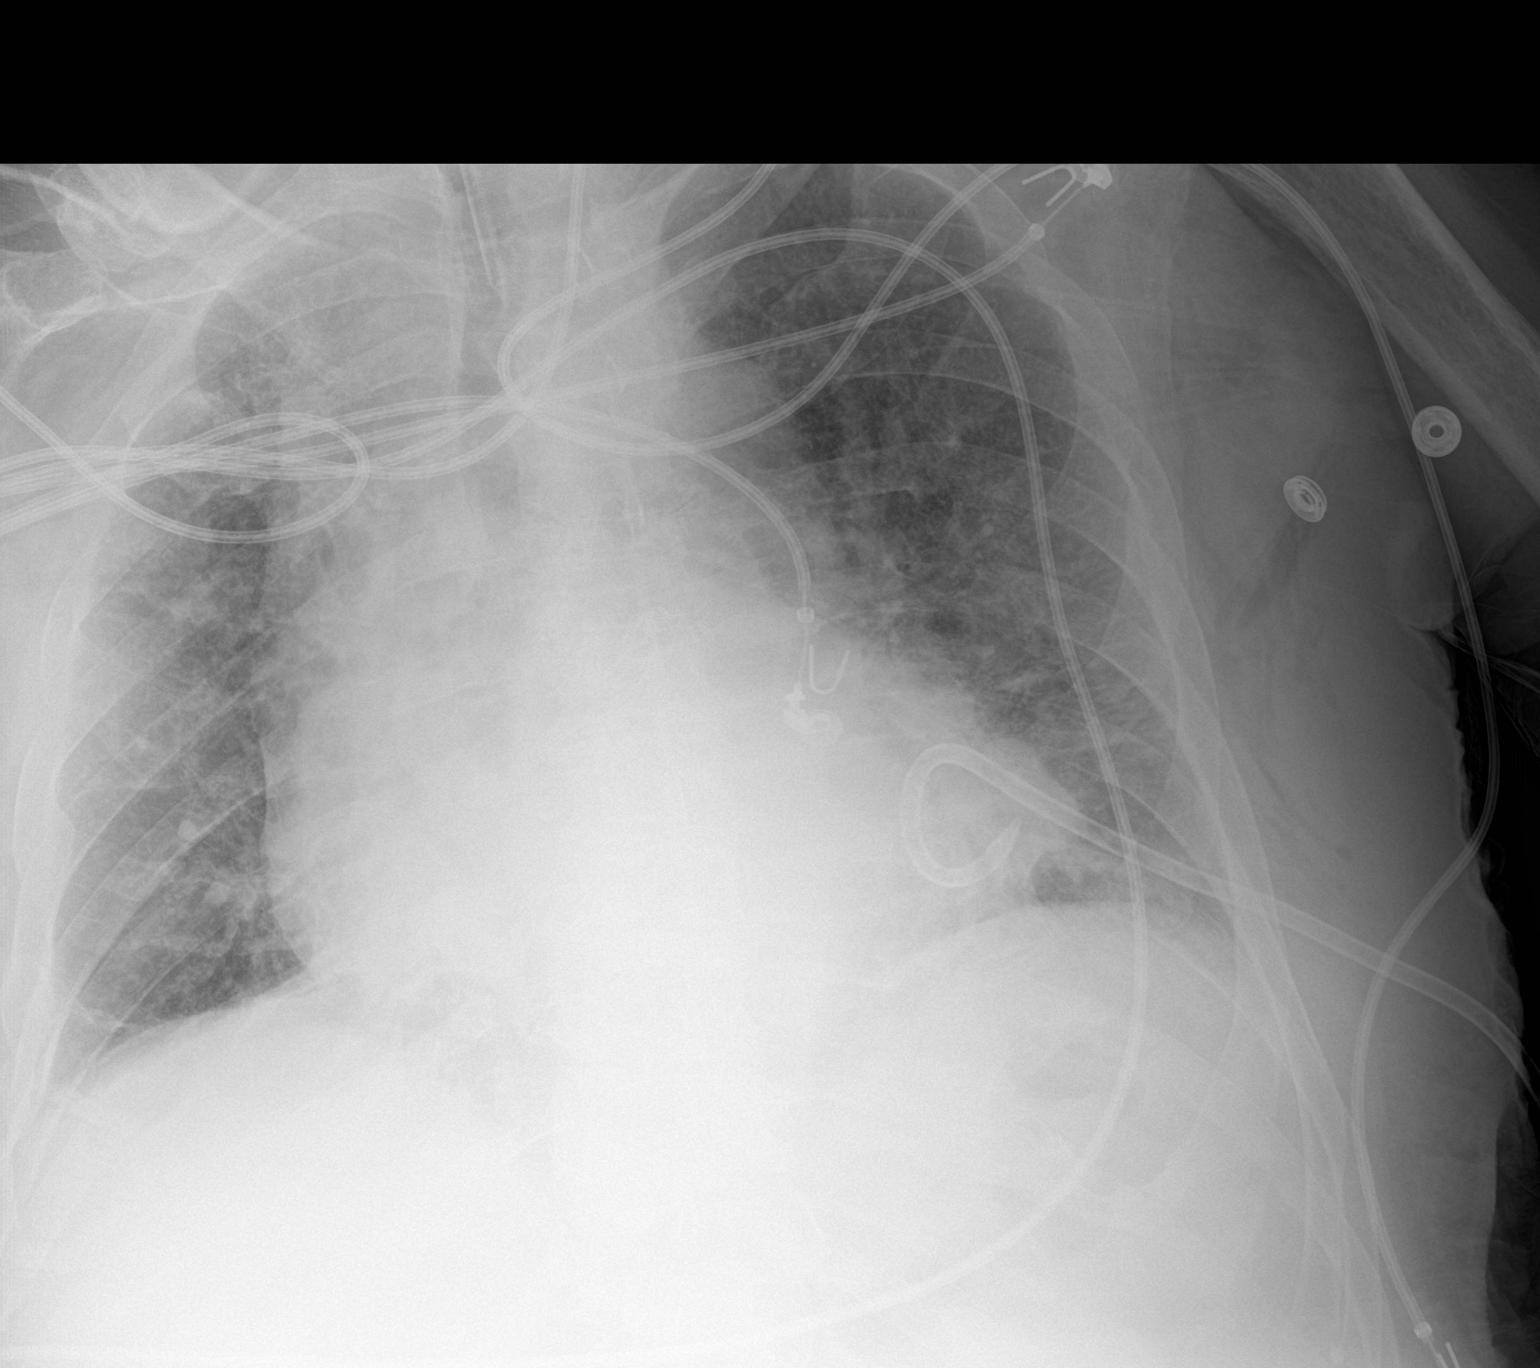

[2 of 2 positions shown; findings below may reference images not displayed]

FINDINGS: Tracheostomy appears stable in position. Overall cardiomediastinal
silhouette is grossly stable given slight differences in patient
positioning. Improved aeration of the right lung on today's exam
suggesting continued improvement in fluid status. Left lung appears
stable compared to yesterday's exam, improved compared to earlier
chest x-rays.

No pneumothorax seen. Chest tube is stable in position at the left
lung base.
IMPRESSION: 1. Chest tube is stable in position at the left lung base. No
pneumothorax seen.
2. Both lungs demonstrate improved aeration compared to earlier
exams indicating improved fluid status.

## 2019-03-19 IMAGING — CT CT ABDOMEN W/O CM
2 of 4 series · 16 of 46 positions shown, 18 images · non-contrast
Comparison: Abdominal CT 02/02/2016

CLINICAL DATA: Evaluate anatomy for potential gastrostomy tube
placement. History of left pneumothorax and left chest tube
placement.

EXAM:
CT ABDOMEN WITHOUT CONTRAST
TECHNIQUE: Multidetector CT imaging of the abdomen was performed following the
standard protocol without IV contrast.

[Series 3: abd/ pelvis 5.0 i30f 2 · axial · 0.79mm/px · z∈[-1097,-787]mm · 13 of 68 slices shown, 15 images]
[im 3/68  soft-tissue]
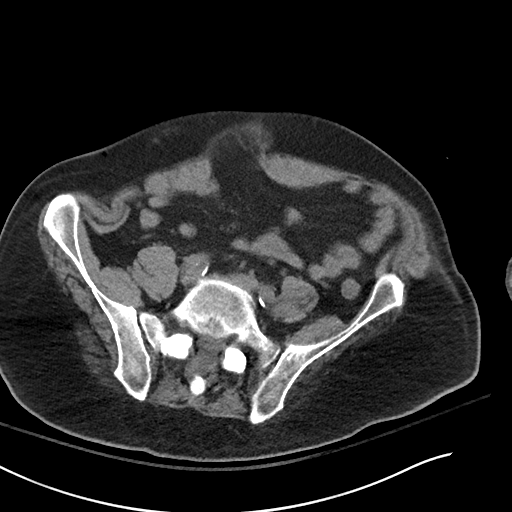
[im 3/68  bone]
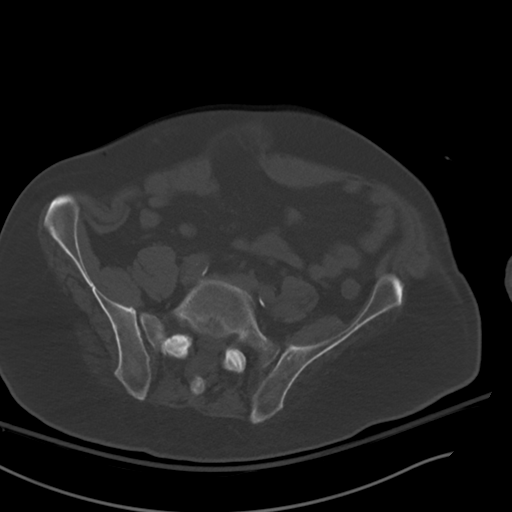
[im 9/68  soft-tissue]
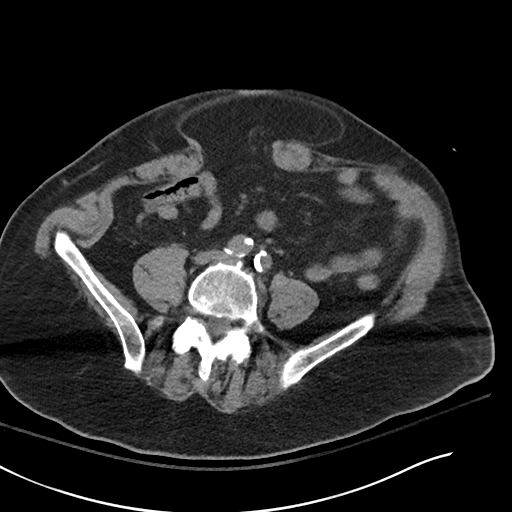
[im 14/68  soft-tissue]
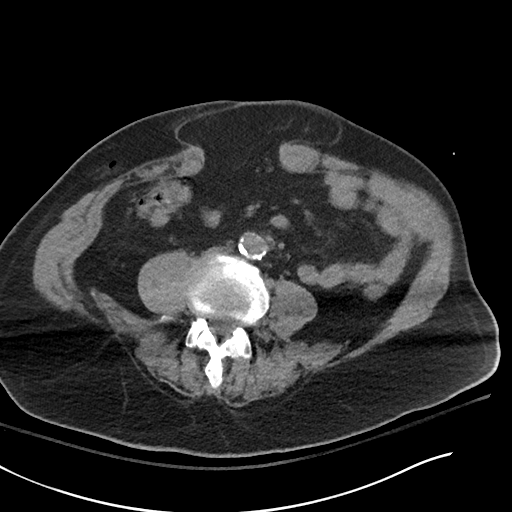
[im 20/68  soft-tissue]
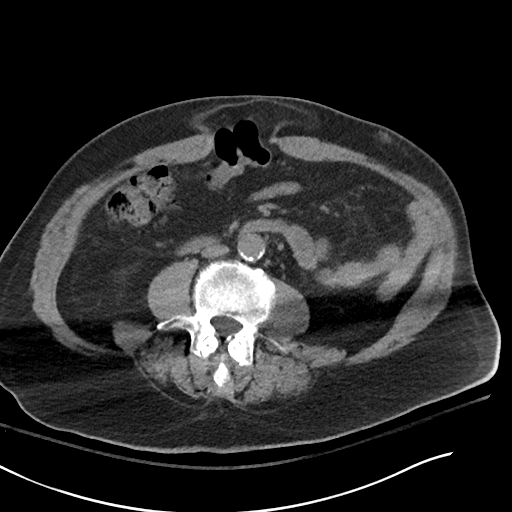
[im 23/68  soft-tissue]
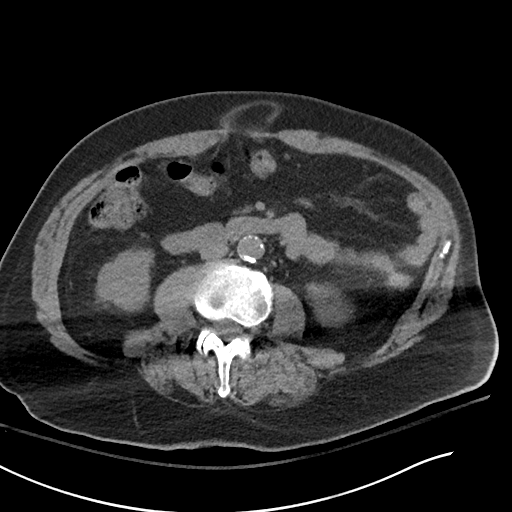
[im 28/68  soft-tissue]
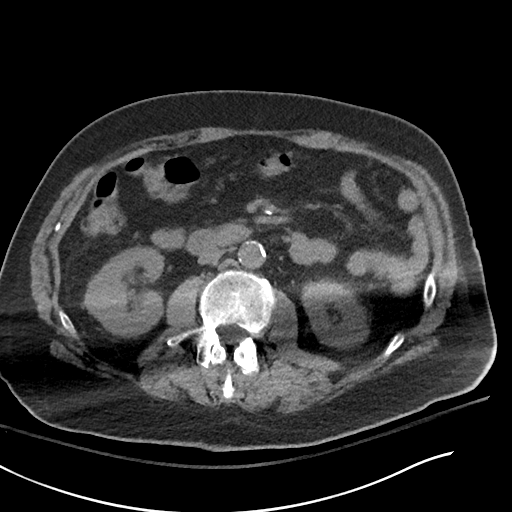
[im 34/68  soft-tissue]
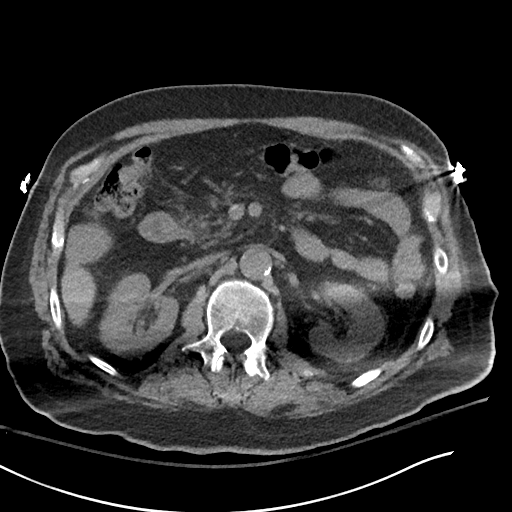
[im 40/68  soft-tissue]
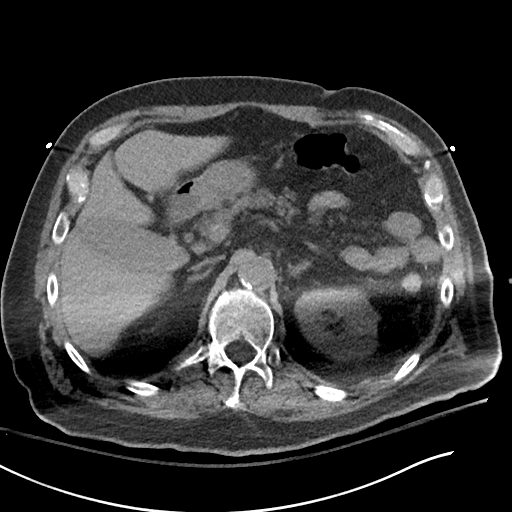
[im 45/68  soft-tissue]
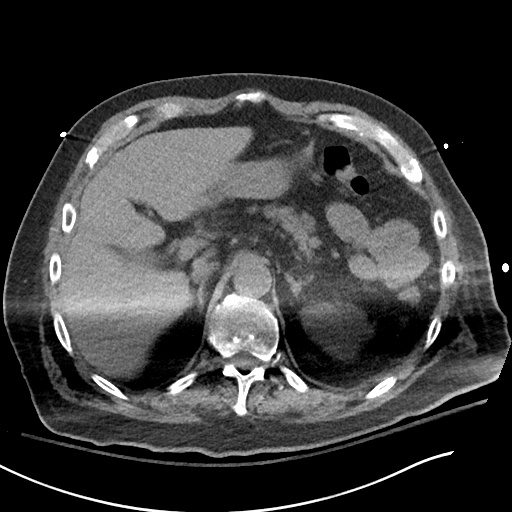
[im 45/68  bone]
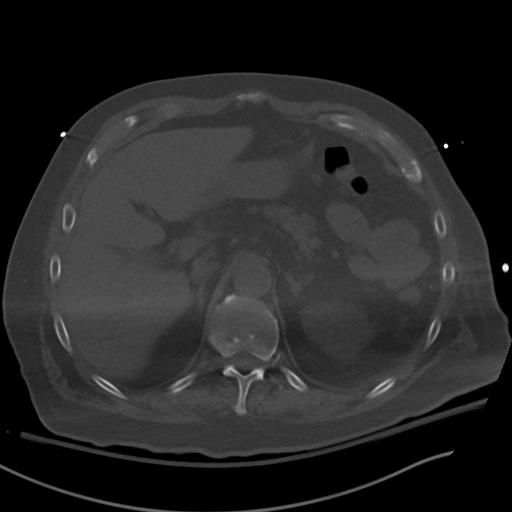
[im 48/68  soft-tissue]
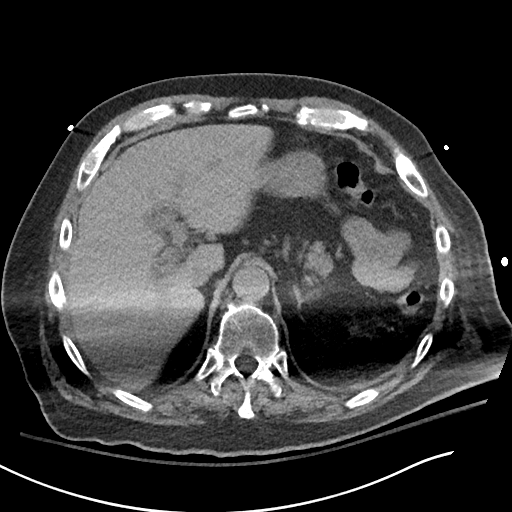
[im 54/68  soft-tissue]
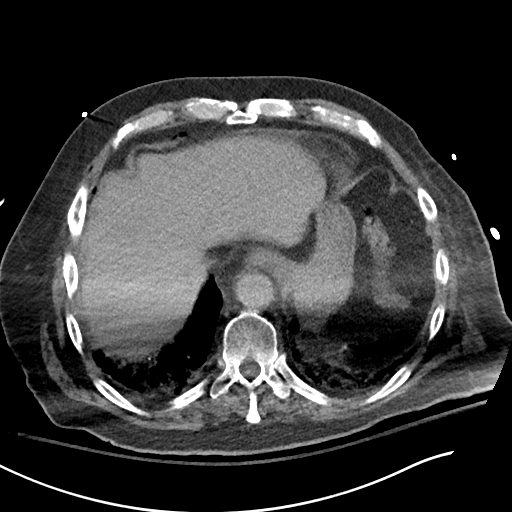
[im 59/68  soft-tissue]
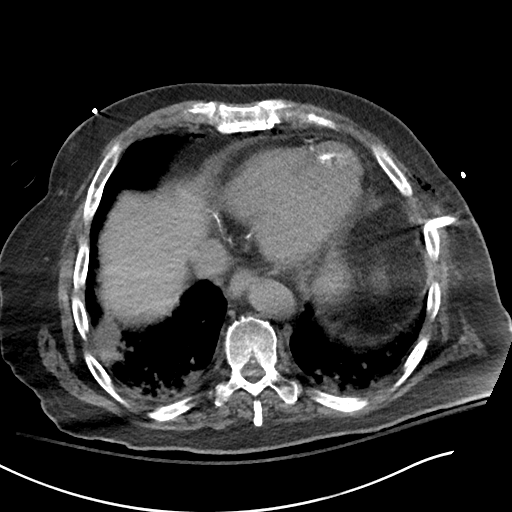
[im 65/68  soft-tissue]
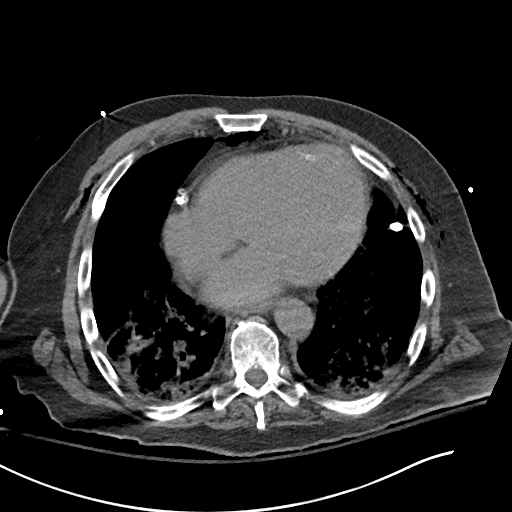

[Series 6: cor st · coronal · 0.66mm/px · 3 of 101 slices shown]
[im 34/101  soft-tissue]
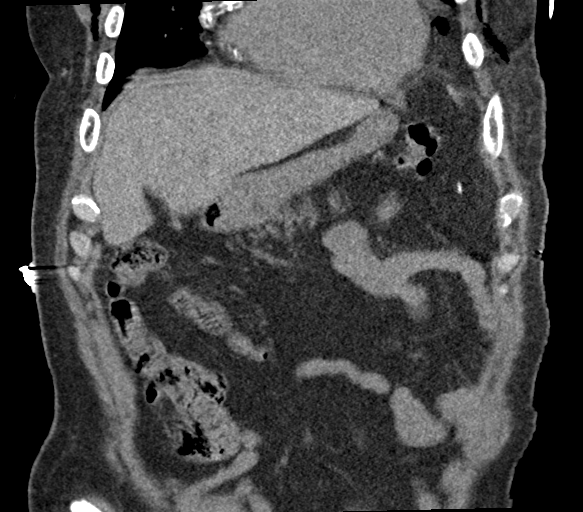
[im 45/101  soft-tissue]
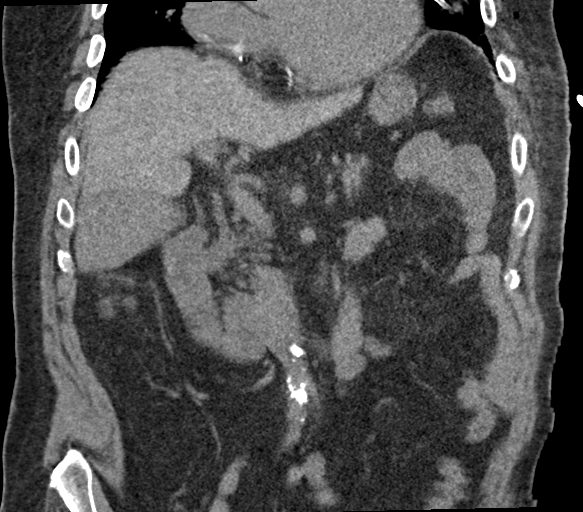
[im 56/101  soft-tissue]
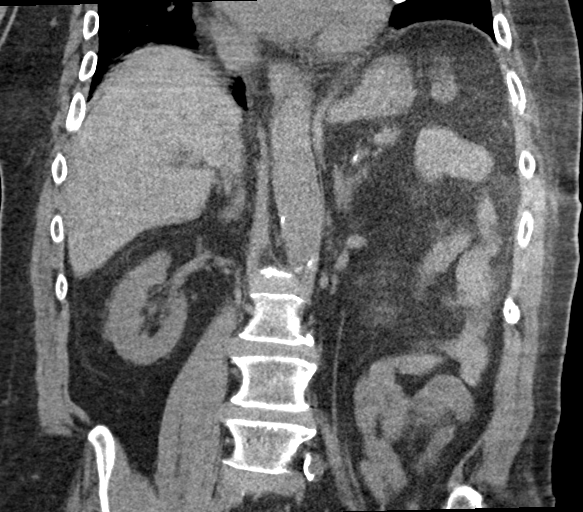

[16 of 46 positions shown; findings below may reference images not displayed]

FINDINGS: Lower chest: Left chest tube is partially visualized. Patchy
interstitial densities in the dependent aspect of both lungs. There
appears to be a small amount of anterior mediastinal gas. There is
anterior subcutaneous gas bilaterally. Heart size appears to be
enlarged. There may be minimal right pleural fluid.

Hepatobiliary: Normal appearance of the liver. Gallbladder is mildly
distended.

Pancreas: Normal appearance of the pancreas without inflammation or
duct dilatation.

Spleen: Changes related to splenectomy.

Adrenals/Urinary Tract: Normal adrenal glands. Normal appearance of
both kidneys without hydronephrosis.

Stomach/Bowel: Normal configuration of the stomach. Left transverse
colon is close to the gastric body region. Mid transverse colon
extends into the ventral hernia. There is no significant small or
large bowel dilatation. Normal appearance of the duodenum.

Vascular/Lymphatic: Atherosclerotic calcifications involving the
aorta and visualized iliac arteries. No evidence for an aortic
aneurysm.

Other: There is a large ventral hernia containing mostly fat but a
small portion of the transverse colon. This ventral hernia is caudal
to where an expected gastrostomy tube placement would be. There is a
small indentation in the left upper abdomen which probably
represents a prior gastrostomy tube site.

Musculoskeletal: Old right rib fractures. Old compression deformity
involving the T8 vertebral body.
IMPRESSION: The anatomy is suitable for percutaneous gastrostomy tube placement.
However, visualization of the left transverse colon should be
considered during gastrostomy tube placement due to the close
proximity of the stomach.

Question previous gastrostomy tube.

Patchy disease at both lung bases with a left chest tube. No
evidence for a large pneumothorax.

Subcutaneous and mediastinal gas.

Large ventral hernia.

## 2019-03-20 IMAGING — CR DG CHEST 1V PORT
1 series · 1 of 1 positions shown · non-contrast
Comparison: January 28, 2017

CLINICAL DATA: Atelectasis.  Tracheostomy present.

EXAM:
PORTABLE CHEST 1 VIEW

[AP]
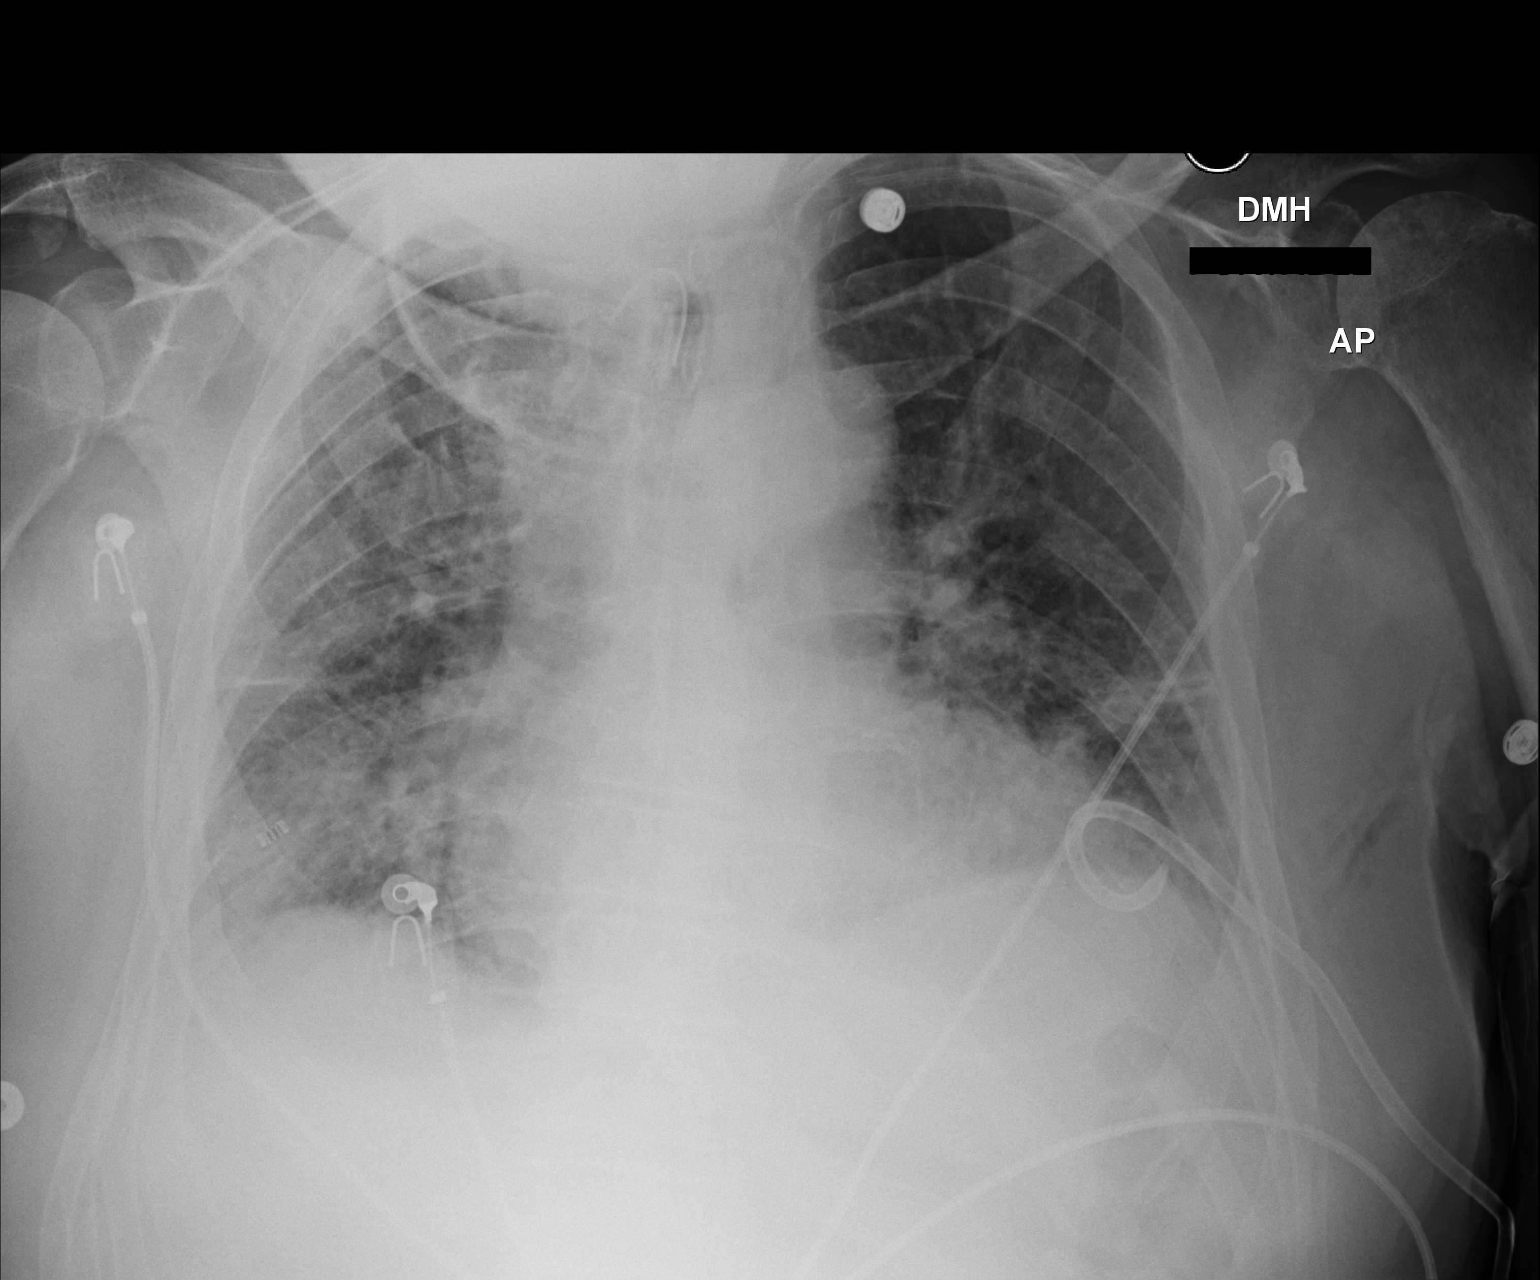

[1 of 1 positions shown; findings below may reference images not displayed]

FINDINGS: Tracheostomy the catheter tip is 4.3 cm above the carina. Chest
catheter present. No evident pneumothorax. There is atelectatic
change in both mid and lower lung zones, stable. There is no
consolidation. Heart is mildly enlarged with pulmonary vascularity
within normal limits. No bone lesions.
IMPRESSION: Tube positions as described without evident pneumothorax. Patchy
atelectasis bilaterally without frank consolidation. Stable cardiac
prominence.

## 2019-03-22 IMAGING — XA IR PERC PLACEMENT GASTROSTOMY
1 series · 1 of 1 positions shown · non-contrast
Comparison: none

CLINICAL DATA: CAD, stroke, CHF, dementia, and respiratory failure
s/p trach placement who now presents with dysphagia. Patient in need
of long-term alternative means of nutrition support.

EXAM:
PERC PLACEMENT GASTROSTOMY
FLUOROSCOPY TIME:  1mGy, 2 minutes
TECHNIQUE: The procedure, risks, benefits, and alternatives were explained to
the patient. Questions regarding the procedure were encouraged and
answered. The patient understands and consents to the procedure. A
safe percutaneous approach to the stomach was confirmed on recent CT
imaging.

[Series 300: dsa body · 1 of 1 slices shown]
[im 1/1]
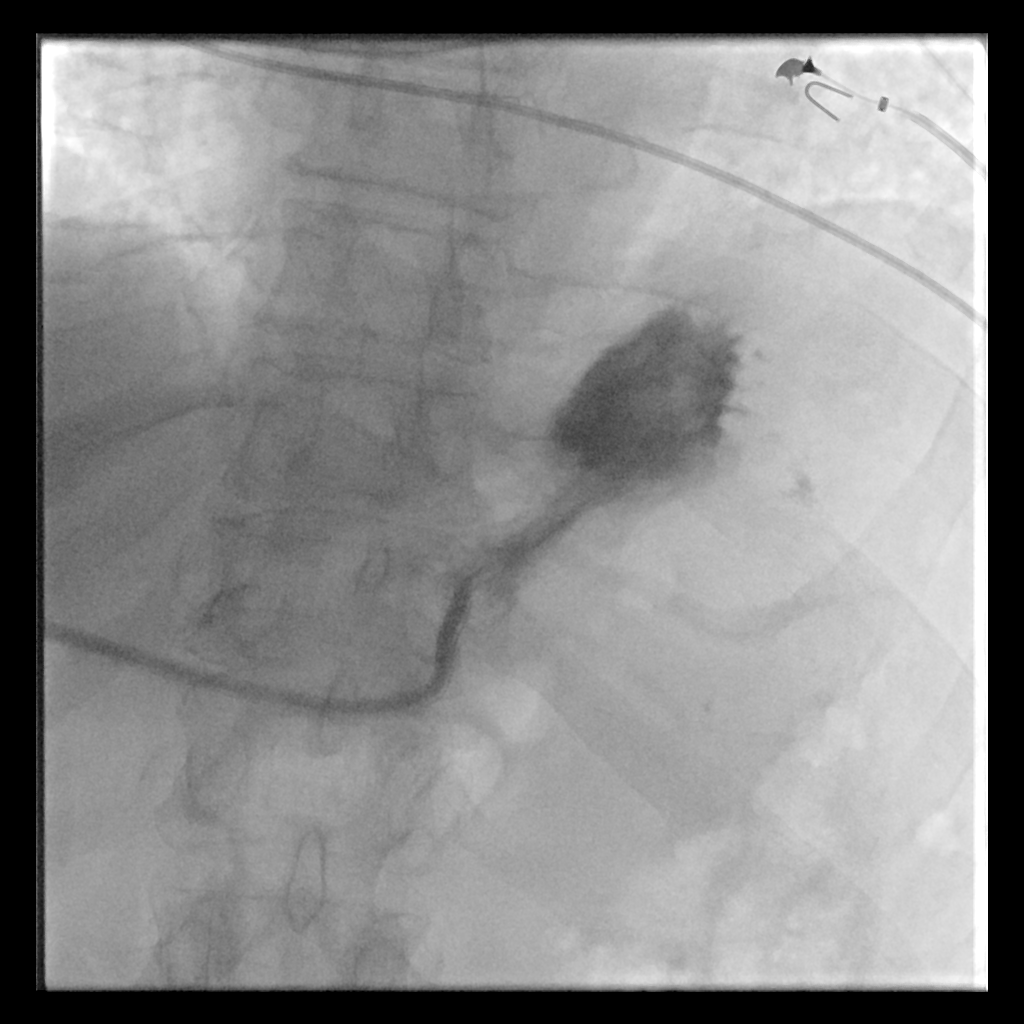

[1 of 1 positions shown; findings below may reference images not displayed]

As antibiotic prophylaxis, cefazolin 2 g was ordered pre-procedure
and administered intravenously within one hour of incision. A 5
French angiographic catheter was placed as orogastric tube. The
upper abdomen was prepped with Betadine, draped in usual sterile
fashion, and infiltrated locally with 1% lidocaine.

Intravenous Fentanyl and Versed were administered as conscious
sedation during continuous monitoring of the patient's level of
consciousness and physiological / cardiorespiratory status by the
radiology RN, with a total moderate sedation time of 10 minutes.
Stomach was insufflated using air through the orogastric tube. An 18
French sheath needle was advanced percutaneously into the gastric
lumen under fluoroscopy. Gas could be aspirated and a small contrast
injection confirmed intraluminal spread. The sheath was exchanged
over a guidewire for a 9 French vascular sheath, through which the
snare device was advanced and used to snare a guidewire passed
through the orogastric tube. This was withdrawn, and the snare
attached to the 20 French pull-through gastrostomy tube, which was
advanced antegrade, positioned with the internal bumper securing the
anterior gastric wall to the anterior abdominal wall. Small contrast
injection confirms appropriate positioning. The external bumper was
applied and the catheter was flushed.

COMPLICATIONS:
COMPLICATIONS
none
IMPRESSION: 1. Technically successful 20 French pull-through gastrostomy
placement under fluoroscopy.

## 2019-03-26 IMAGING — DX DG CHEST 1V PORT
1 series · 1 of 1 positions shown · non-contrast
Comparison: Chest x-rays dated 01/31/2017 and 01/28/2017.

CLINICAL DATA: Congestion, check trach tube placement and
pneumothorax according to chart.

EXAM:
PORTABLE CHEST 1 VIEW

[chest ap]
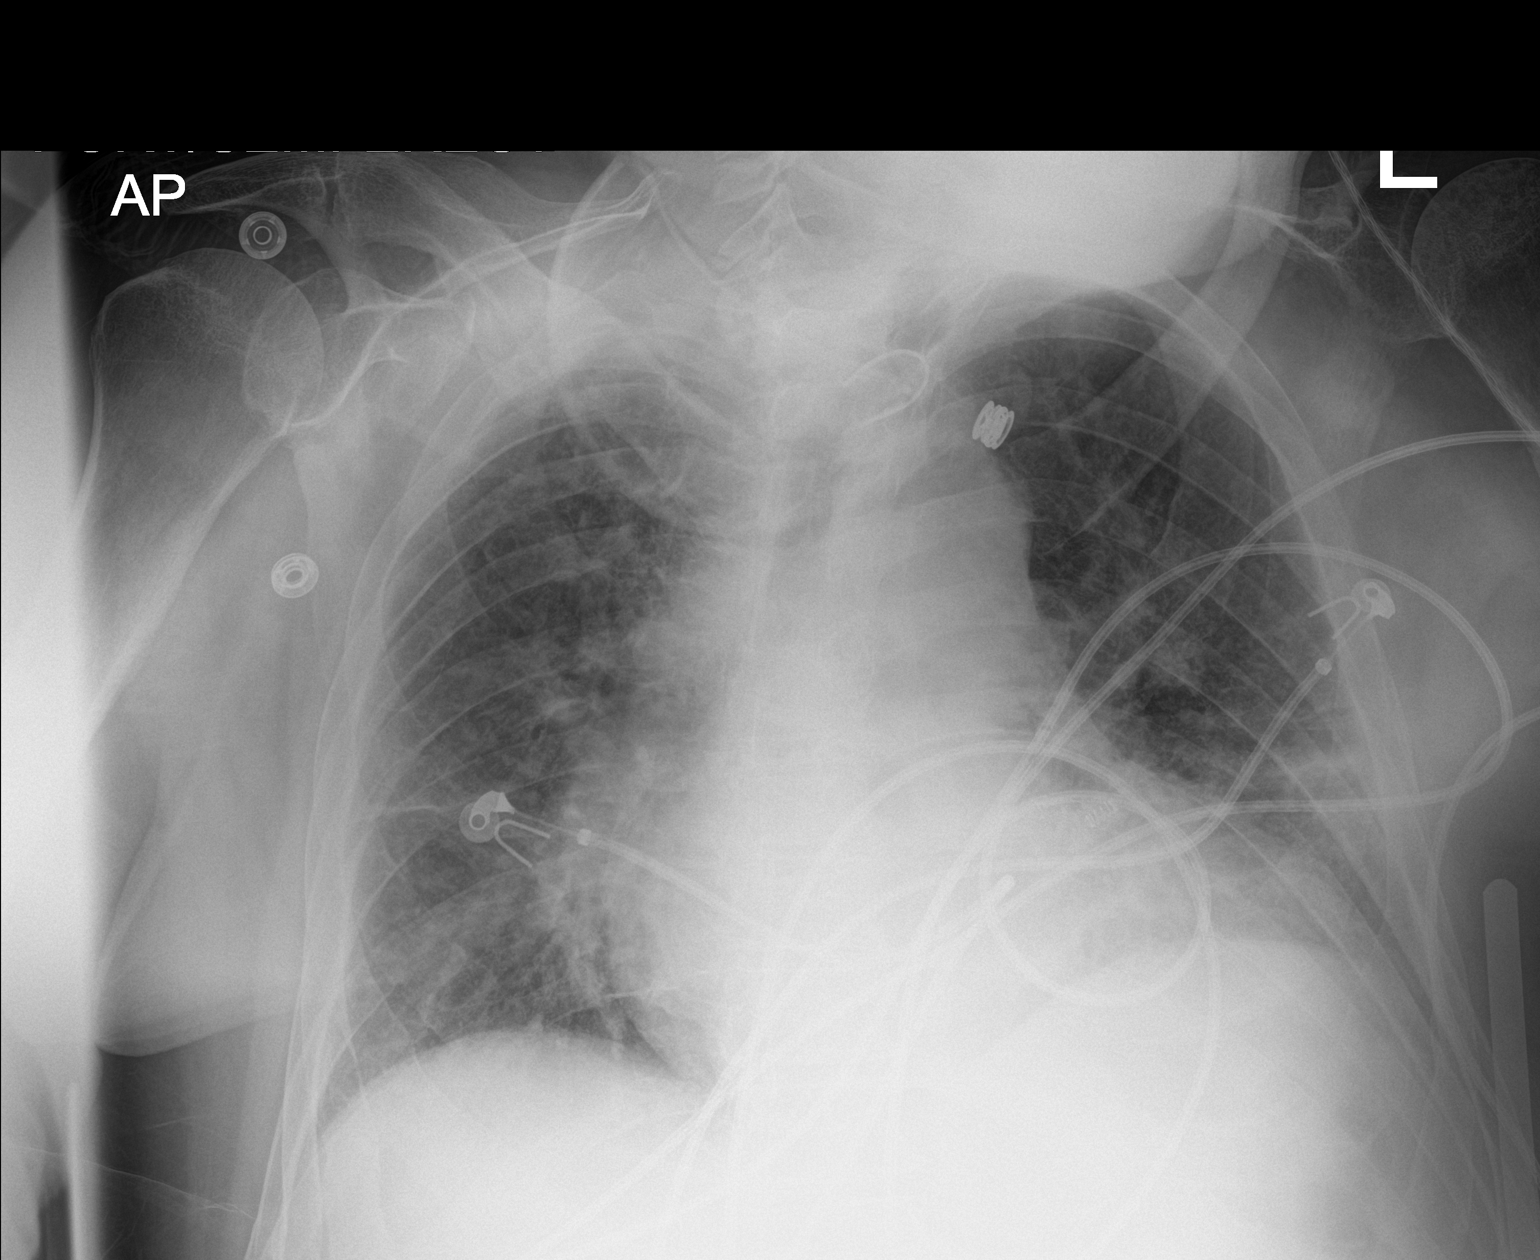

[1 of 1 positions shown; findings below may reference images not displayed]

FINDINGS: Tracheostomy with a slightly more acute angle than on the previous
exam, perhaps accentuated by patient positioning, but with tip again
appropriately positioned over the tracheal air column.

Cardiomegaly is stable. Central pulmonary vascular congestion and
mild bilateral interstitial edema. No pleural effusion or
pneumothorax seen.
IMPRESSION: 1. Tracheostomy tube with a slightly more acute angle/bend than on
the previous exam, likely accentuated by patient's head positioning,
with tip again appropriately positioned over the tracheal air
column.
2. Cardiomegaly with central pulmonary vascular congestion and mild
bilateral interstitial edema, stable, suggesting mild CHF/volume
overload.
3. No pneumothorax seen.

## 2019-03-28 IMAGING — CR DG CHEST 1V PORT
1 series · 1 of 1 positions shown · non-contrast
Comparison: 02/06/2017 and CT chest 12/23/2016.

CLINICAL DATA: Respiratory failure.

EXAM:
PORTABLE CHEST 1 VIEW

[AP]
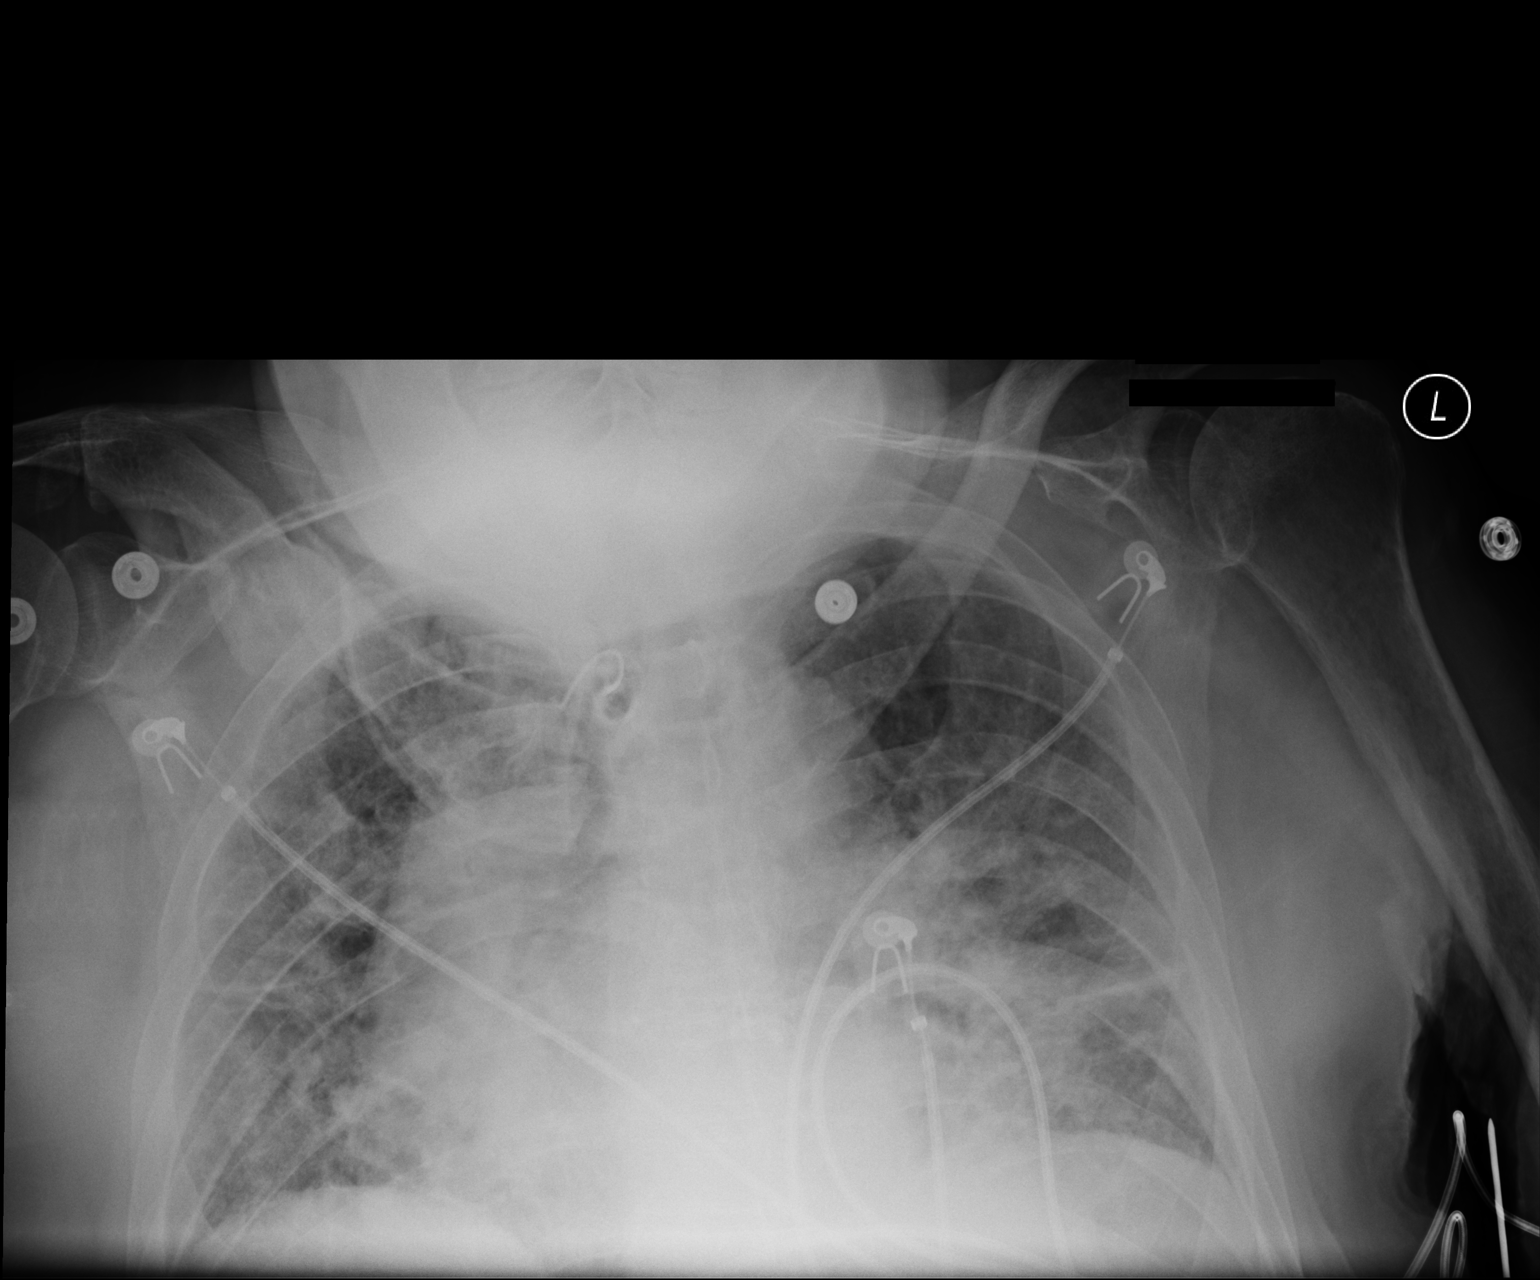

[1 of 1 positions shown; findings below may reference images not displayed]

FINDINGS: Tracheostomy is poorly evaluated due to the patient's head position.
Heart size is grossly stable. Lungs are low in volume with mixed
interstitial and airspace opacification, likely accentuated by low
lung volumes. No definite pleural fluid.
IMPRESSION: Favor mild pulmonary edema. Difficult to exclude left perihilar/
infrahilar pneumonia.

## 2019-04-09 IMAGING — CR DG CHEST 1V PORT
1 series · 1 of 1 positions shown · non-contrast
Comparison: 02/08/2017

CLINICAL DATA: Trach

EXAM:
PORTABLE CHEST 1 VIEW

[AP]
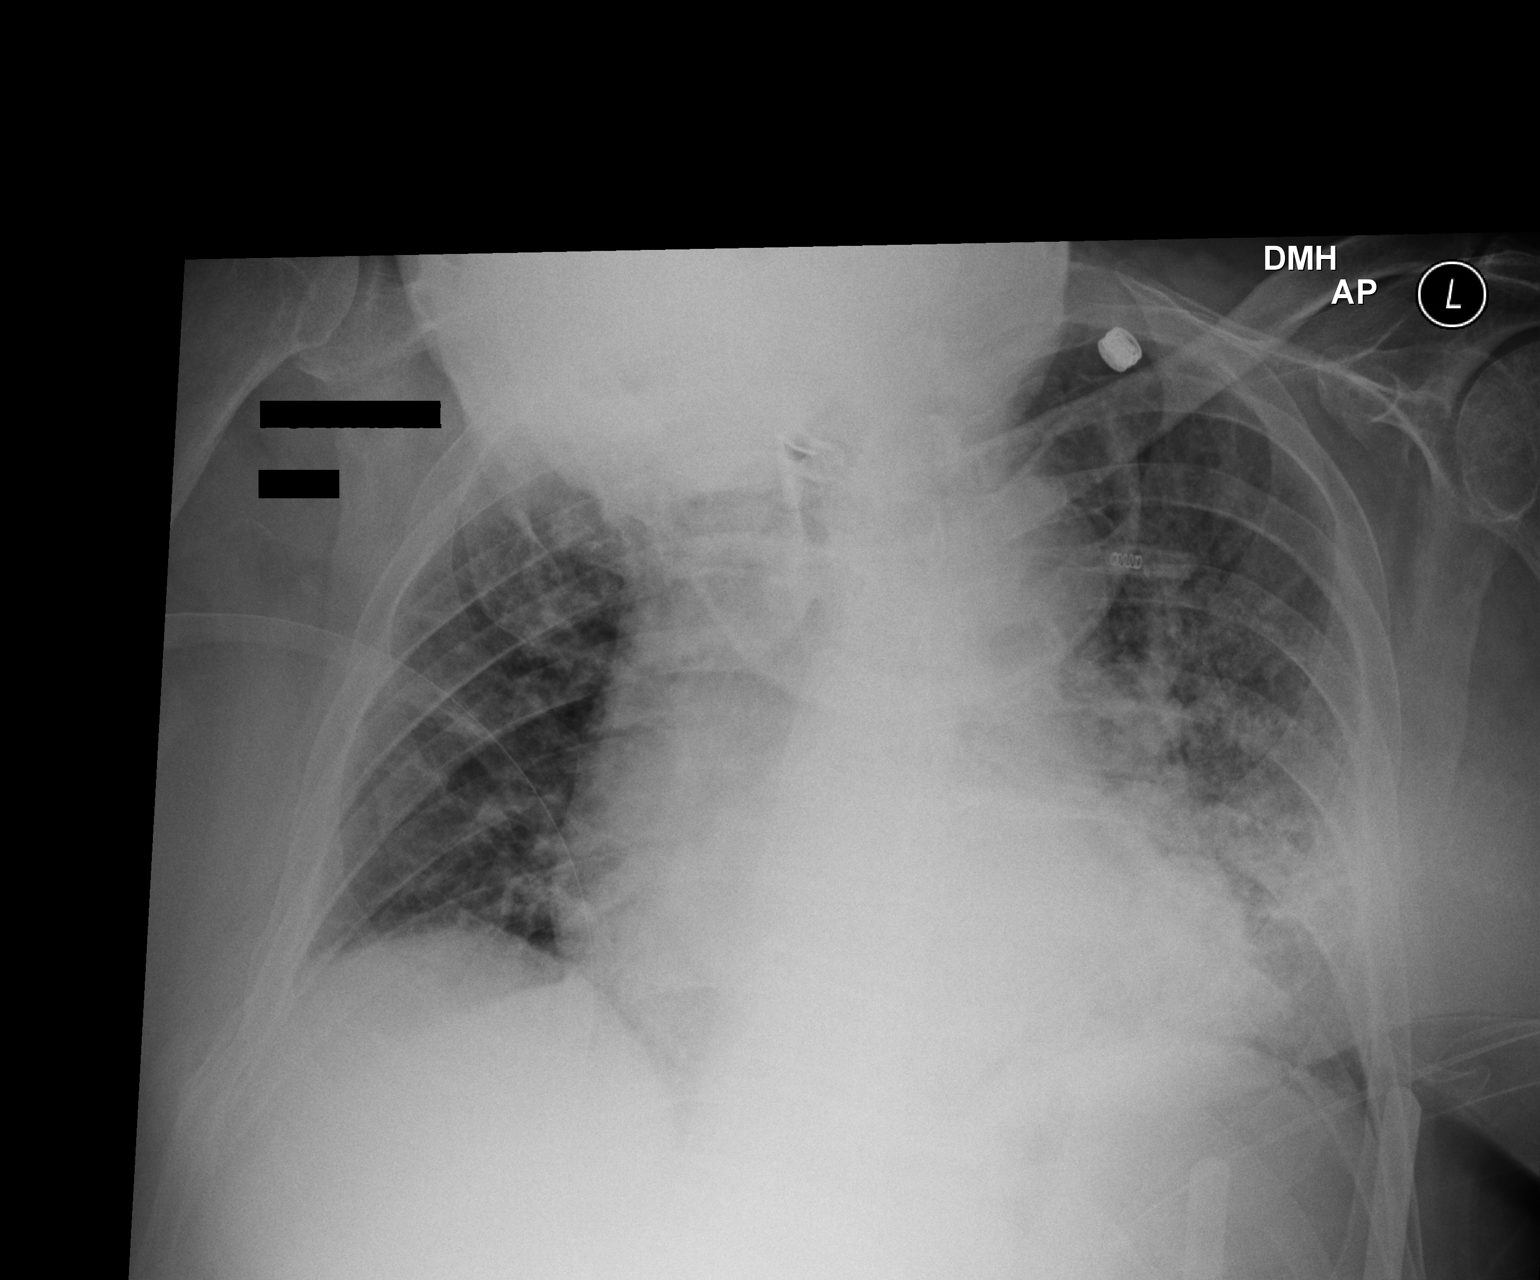

[1 of 1 positions shown; findings below may reference images not displayed]

FINDINGS: Patient's chin obscures the right apex.

Tracheostomy is poorly visualized but likely terminates at the
thoracic inlet.

Low lung volumes with mild perihilar opacities, left greater than
right. When correlating with multiple prior radiographs, the
waxing/waning appearance of the opacities favors mild edema over
multifocal pneumonia.

No definite pleural effusions.  No pneumothorax.

Cardiomegaly.
IMPRESSION: Limited evaluation.

Tracheostomy likely terminates at the thoracic inlet.

Multifocal pulmonary opacities, left perihilar predominant, favoring
mild edema over multifocal pneumonia.

## 2019-04-19 IMAGING — RF DG SWALLOWING FUNCTION - NRPT [facility]
1 series · 18 of 24 positions shown · non-contrast
Comparison: none

[Series 1: run · 12 acquisitions, 18 frames shown]
[im 1/12]
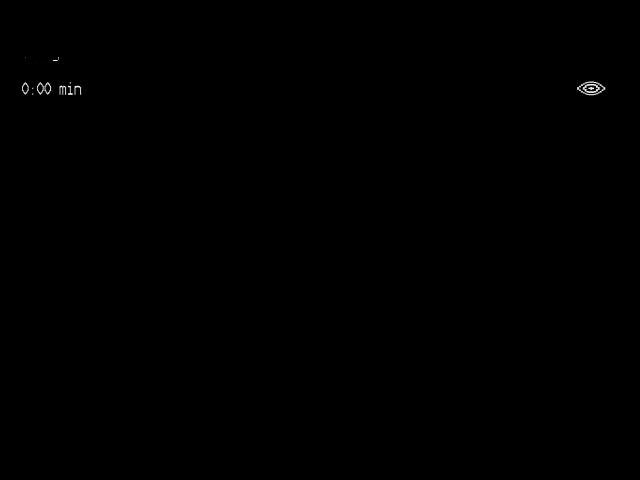
[im 2/12]
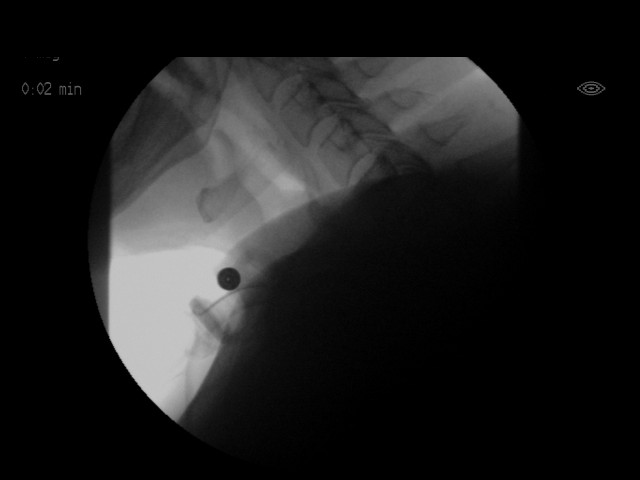
[im 2/12]
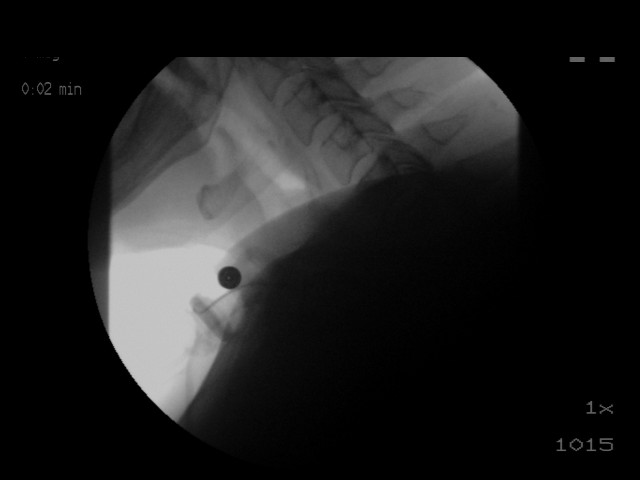
[im 3/12]
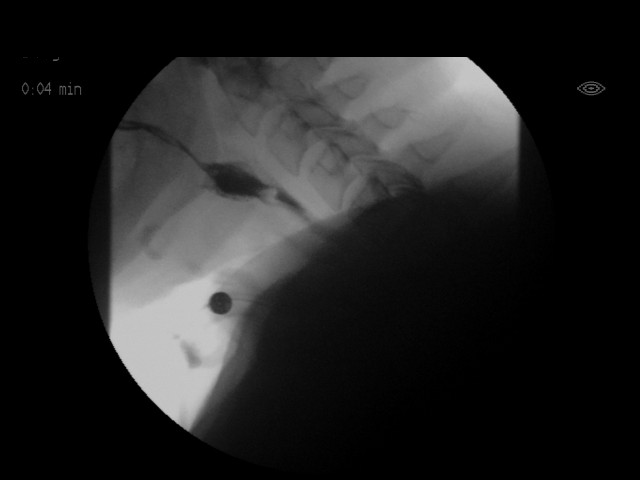
[im 4/12]
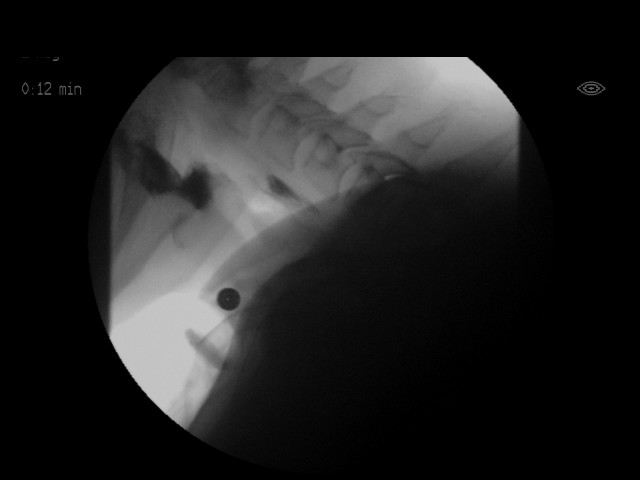
[im 4/12]
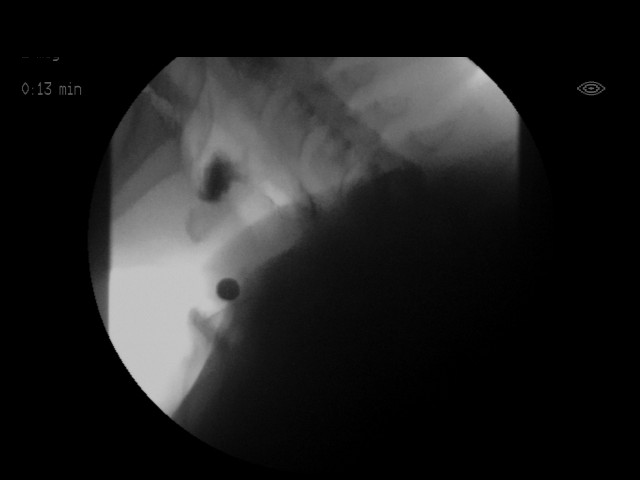
[im 5/12]
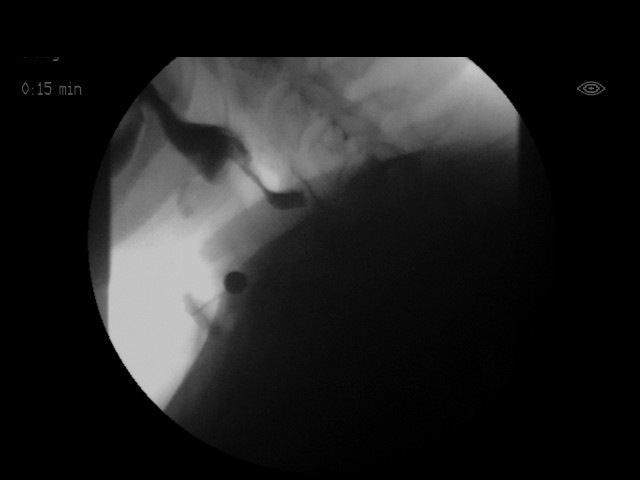
[im 6/12]
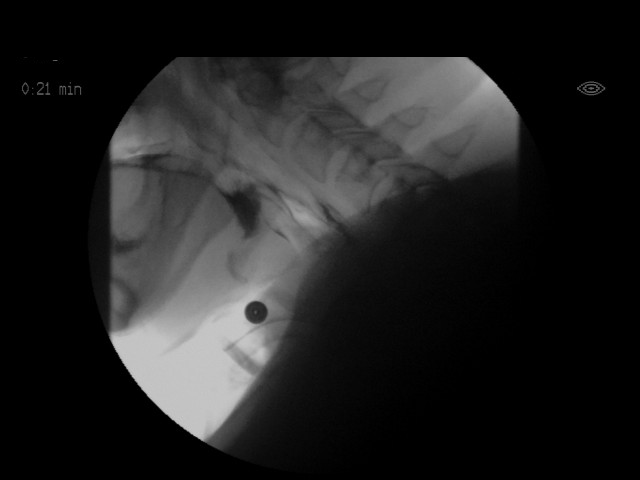
[im 6/12]
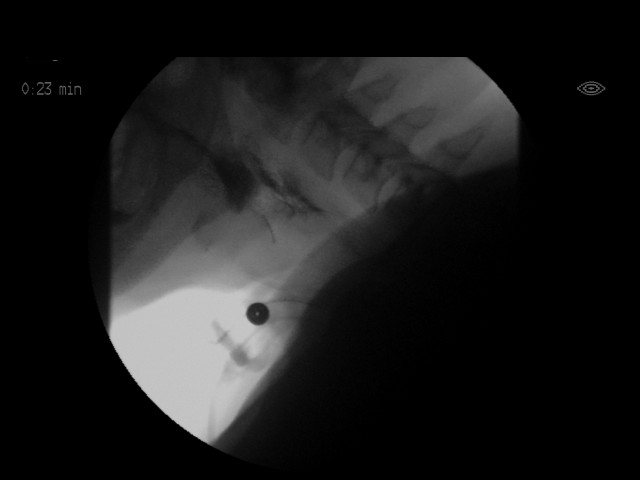
[im 7/12]
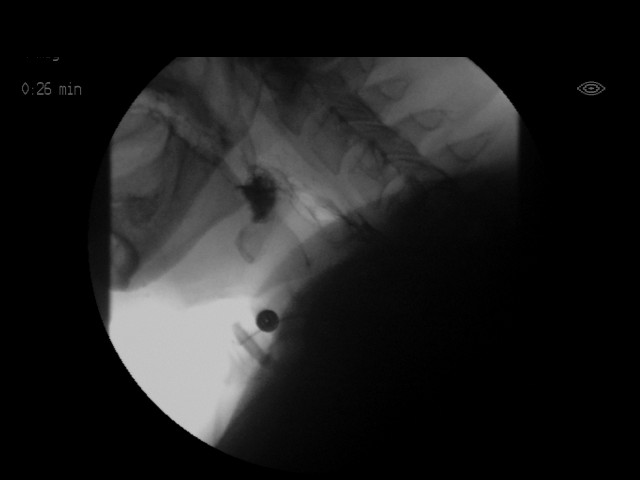
[im 8/12]
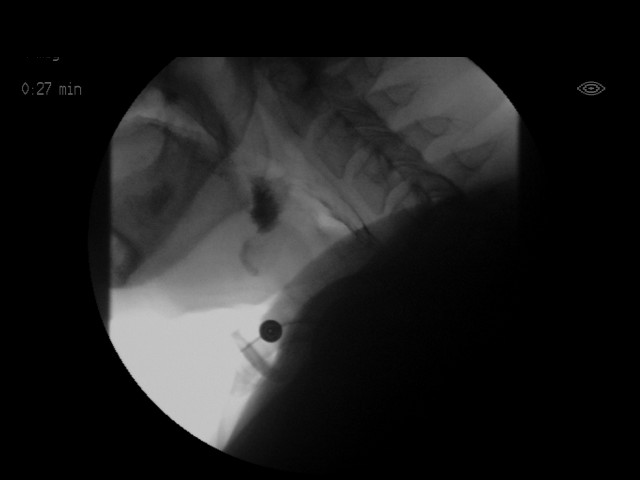
[im 8/12]
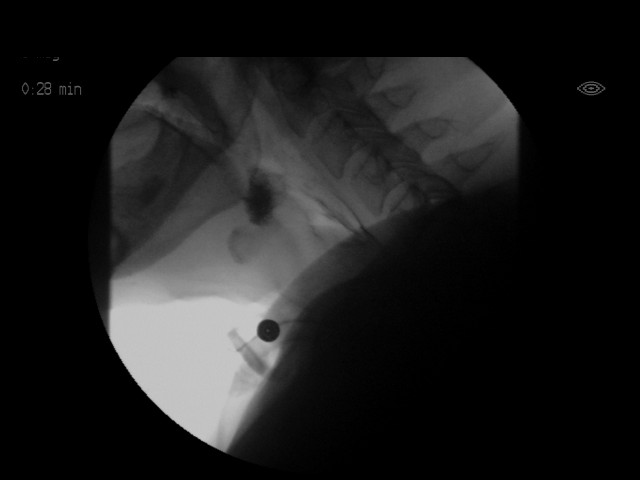
[im 9/12]
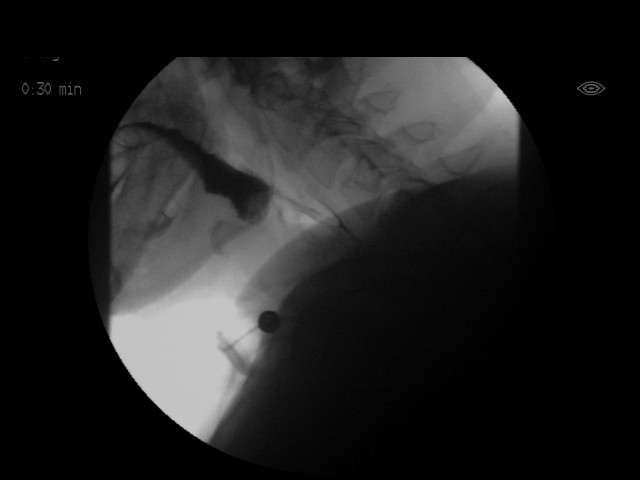
[im 10/12]
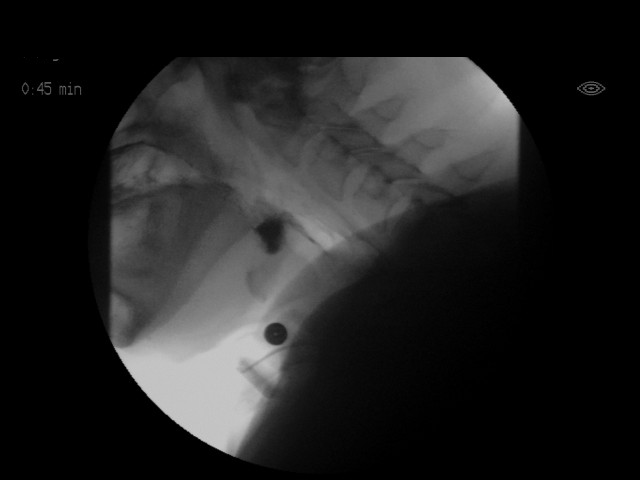
[im 10/12]
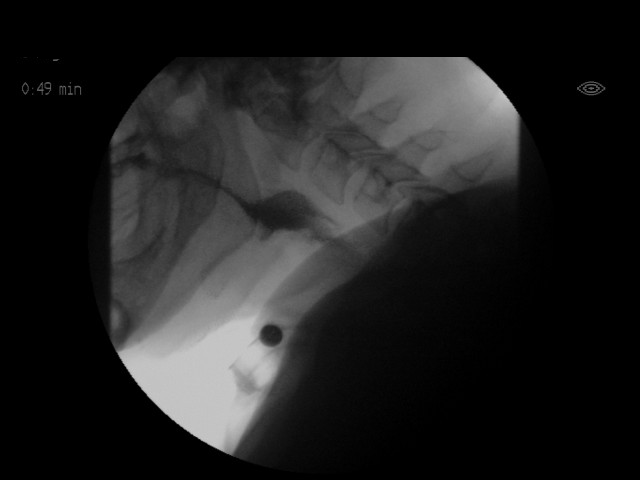
[im 11/12]
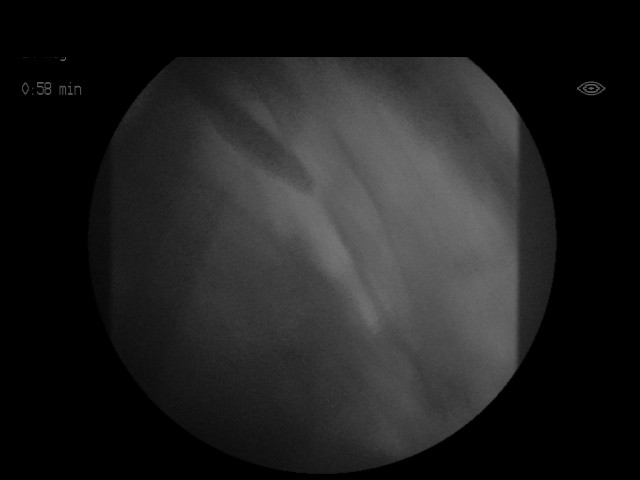
[im 12/12]
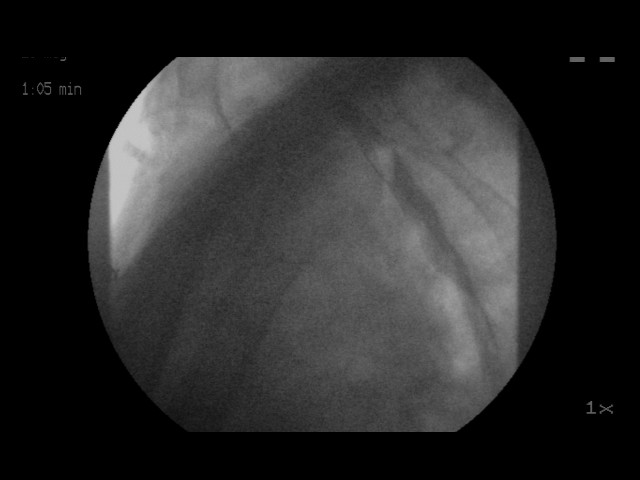
[im 12/12]
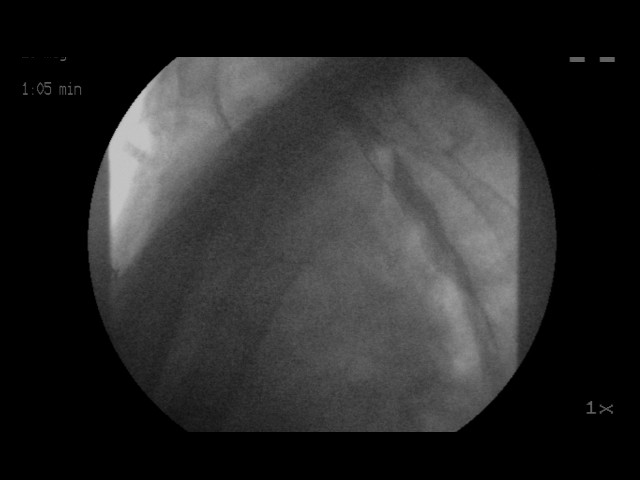

[18 of 24 positions shown; findings below may reference images not displayed]

FLUOROSCOPY FOR SWALLOWING FUNCTION STUDY:
Fluoroscopy was provided for swallowing function study, which was administered by a speech pathologist.  Final results and recommendations from this study are contained within the speech pathology report.

## 2019-04-23 IMAGING — DX DG CHEST 1V PORT
1 series · 1 of 1 positions shown · non-contrast
Comparison: 02/20/2017

CLINICAL DATA: RESP FAILURE,,HX ASPIRATION PNA

EXAM:
PORTABLE CHEST 1 VIEW

[chest ap]
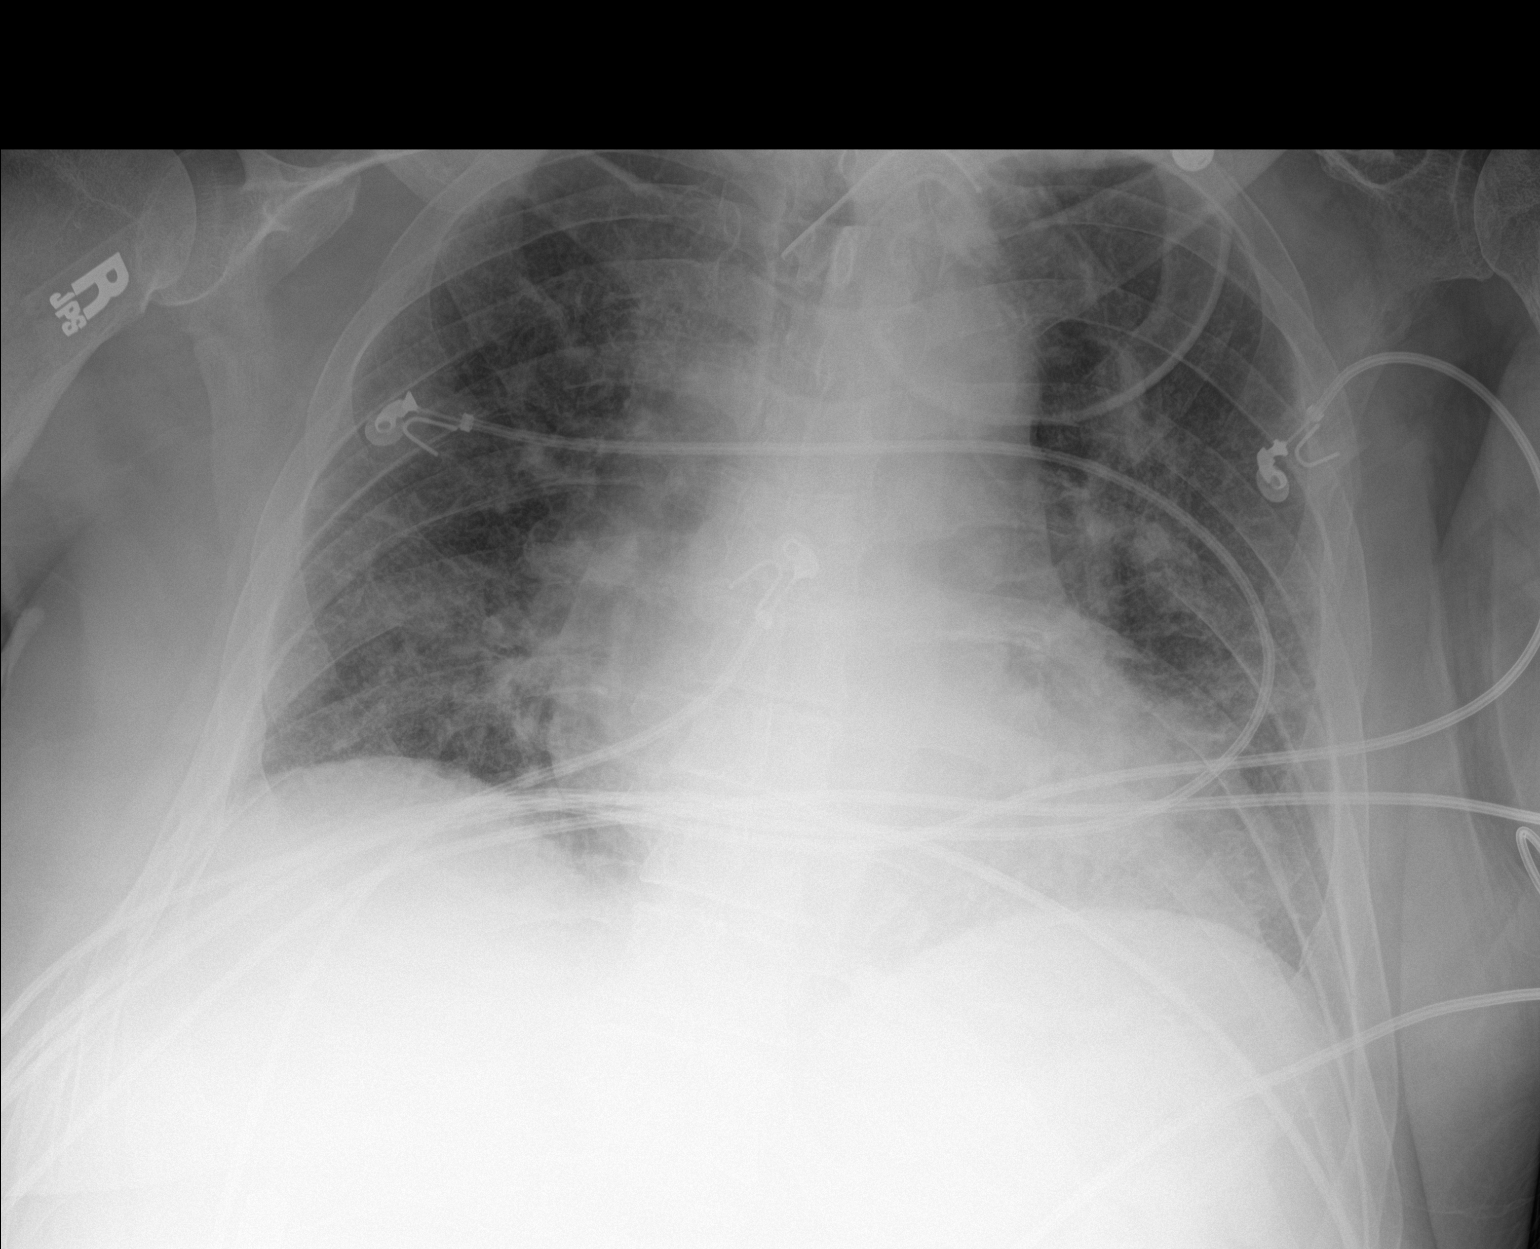

[1 of 1 positions shown; findings below may reference images not displayed]

FINDINGS: Tracheostomy tube tip projects over the tracheal air shadow with a
mildly exaggerated angulation some of which could possibly be due to
thoracic kyphosis, and similar to prior exams.

Mild enlargement of the cardiopericardial silhouette with indistinct
pulmonary vasculature and interstitial accentuation. The indistinct
left perihilar airspace opacities improved. No visible pleural
effusion. Evidence of prior bilateral rib fractures. I do not see a
current pneumothorax.

Deformity from old healed right clavicular fracture.
IMPRESSION: 1. Improved airspace opacity in the left mid lung.
2. Cardiomegaly with indistinct pulmonary vasculature and
interstitial accentuation which could reflect mild interstitial
edema.
3. Old healed fractures bilaterally.
4. Tracheostomy tube noted, slightly exaggerated angulation between
the tube in the trachea although this could be caused by kyphosis.

## 2019-05-04 IMAGING — DX DG CHEST 2V
2 series · 2 of 2 positions shown · non-contrast
Comparison: Portable chest x-ray earlier today [DATE] p.m.,
03/06/2017 and earlier.

CLINICAL DATA: 61-year-old with chronic ventilator dependent
respiratory failure presenting with cough. Current history of
chronic systolic congestive heart failure, hypertension,
cardiomyopathy and COPD.

EXAM:
CHEST  2 VIEW [DATE] p.m.:

[chest lat]
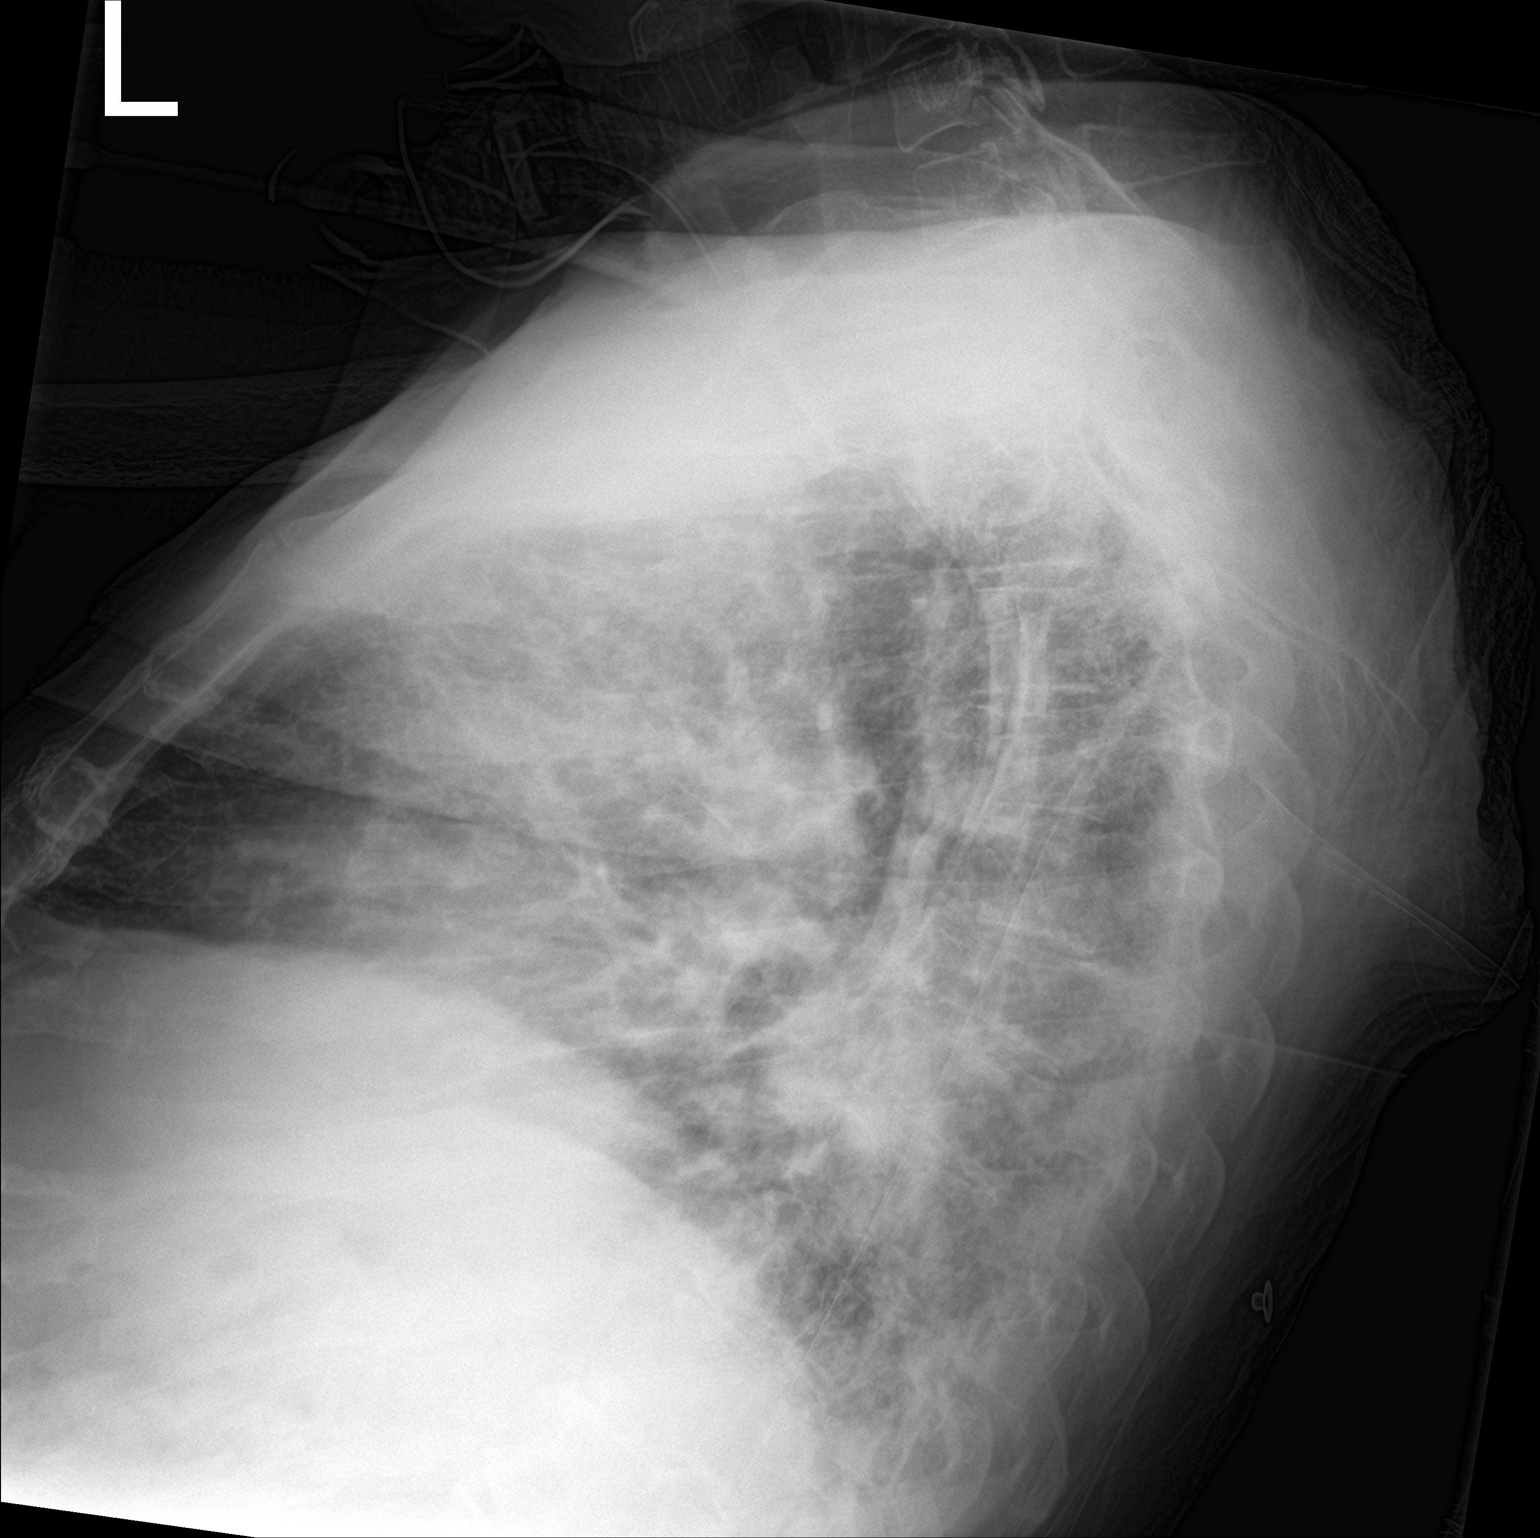

[chest ap]
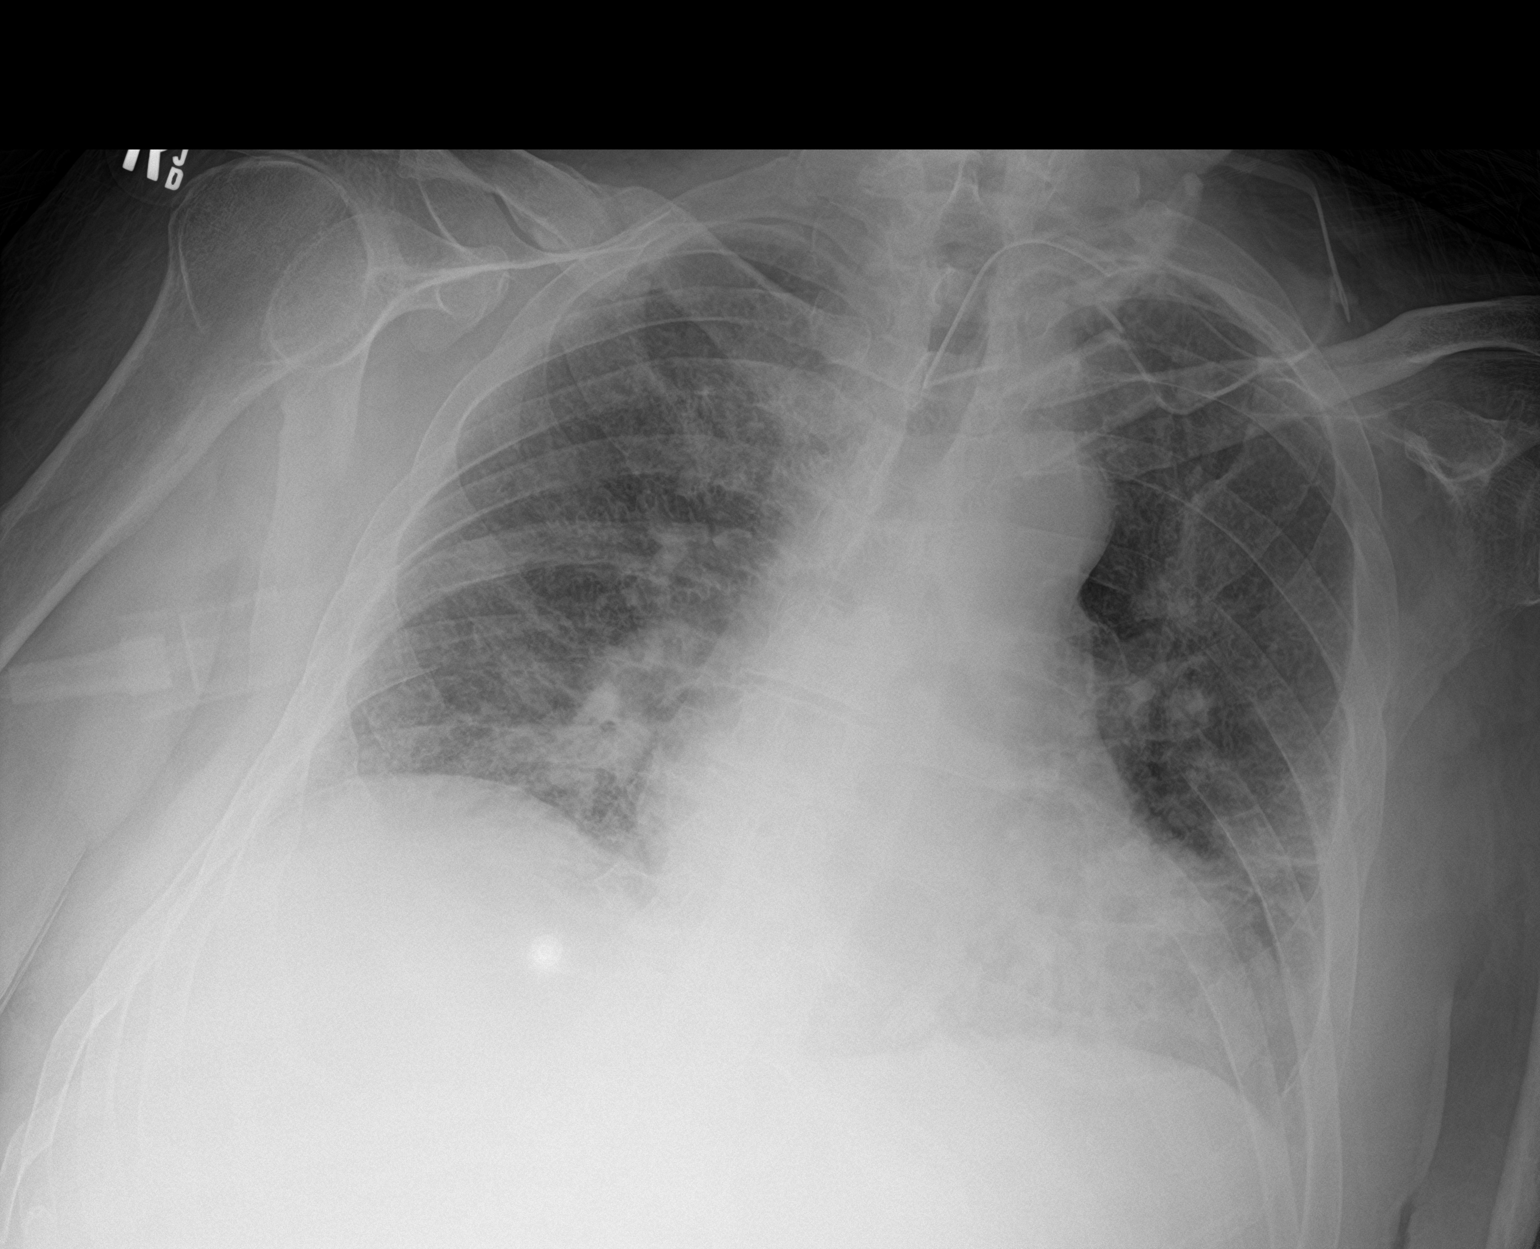

[2 of 2 positions shown; findings below may reference images not displayed]

FINDINGS: AP erect and lateral images were obtained. Tracheostomy tube tip in
satisfactory position below the thoracic inlet projecting
approximately 5 cm above carina. Patchy airspace opacities in the
lung bases, left greater than right, similar to the examination
earlier today, new since 03/06/2017. Slight improvement in the
interstitial pulmonary edema since earlier today. No new pulmonary
parenchymal abnormalities since earlier today.
IMPRESSION: 1. Tracheostomy tube tip in satisfactory position approximately 5 cm
above carina.
2. Pneumonia involving the lung bases, left greater than right.
3. Improving interstitial pulmonary edema.

## 2019-05-04 IMAGING — DX DG CHEST 1V PORT
1 series · 2 of 2 positions shown · non-contrast
Comparison: 03/06/2017, 02/20/2017, 02/08/2017

CLINICAL DATA: Shortness of breath

EXAM:
PORTABLE CHEST 1 VIEW

[Series 1: chest ap · 0.14mm/px · 2 of 2 slices shown]
[im 1/2]
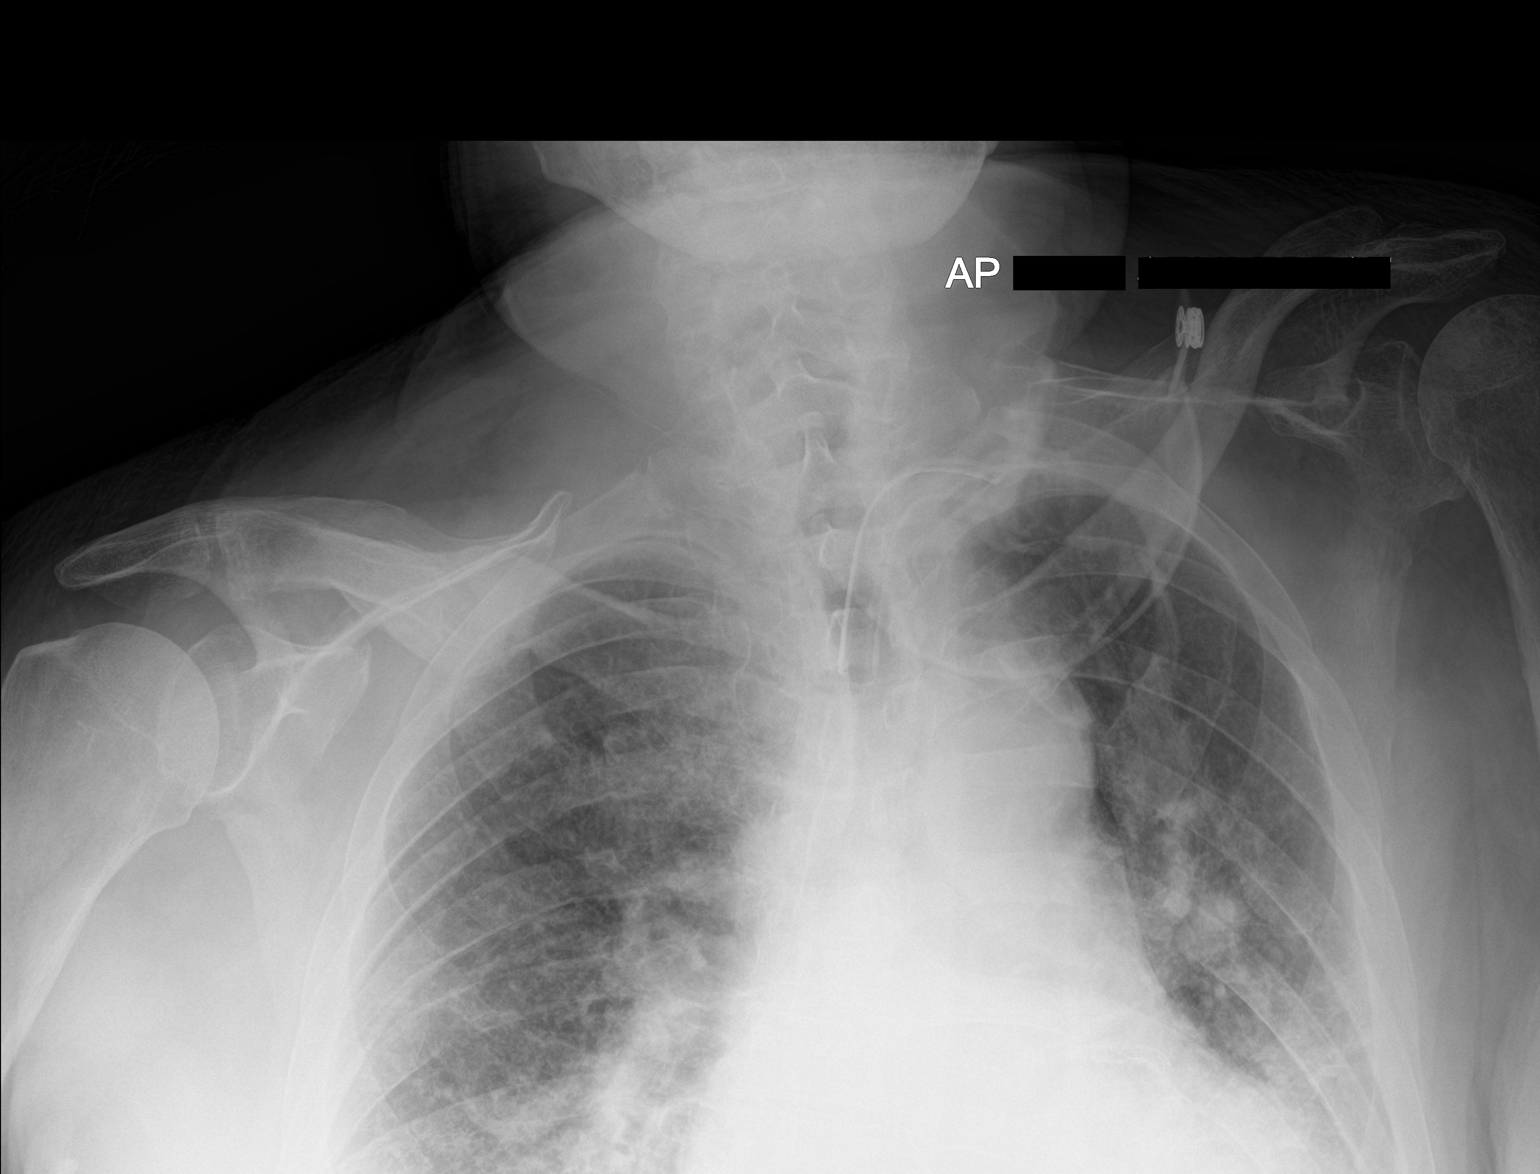
[im 2/2]
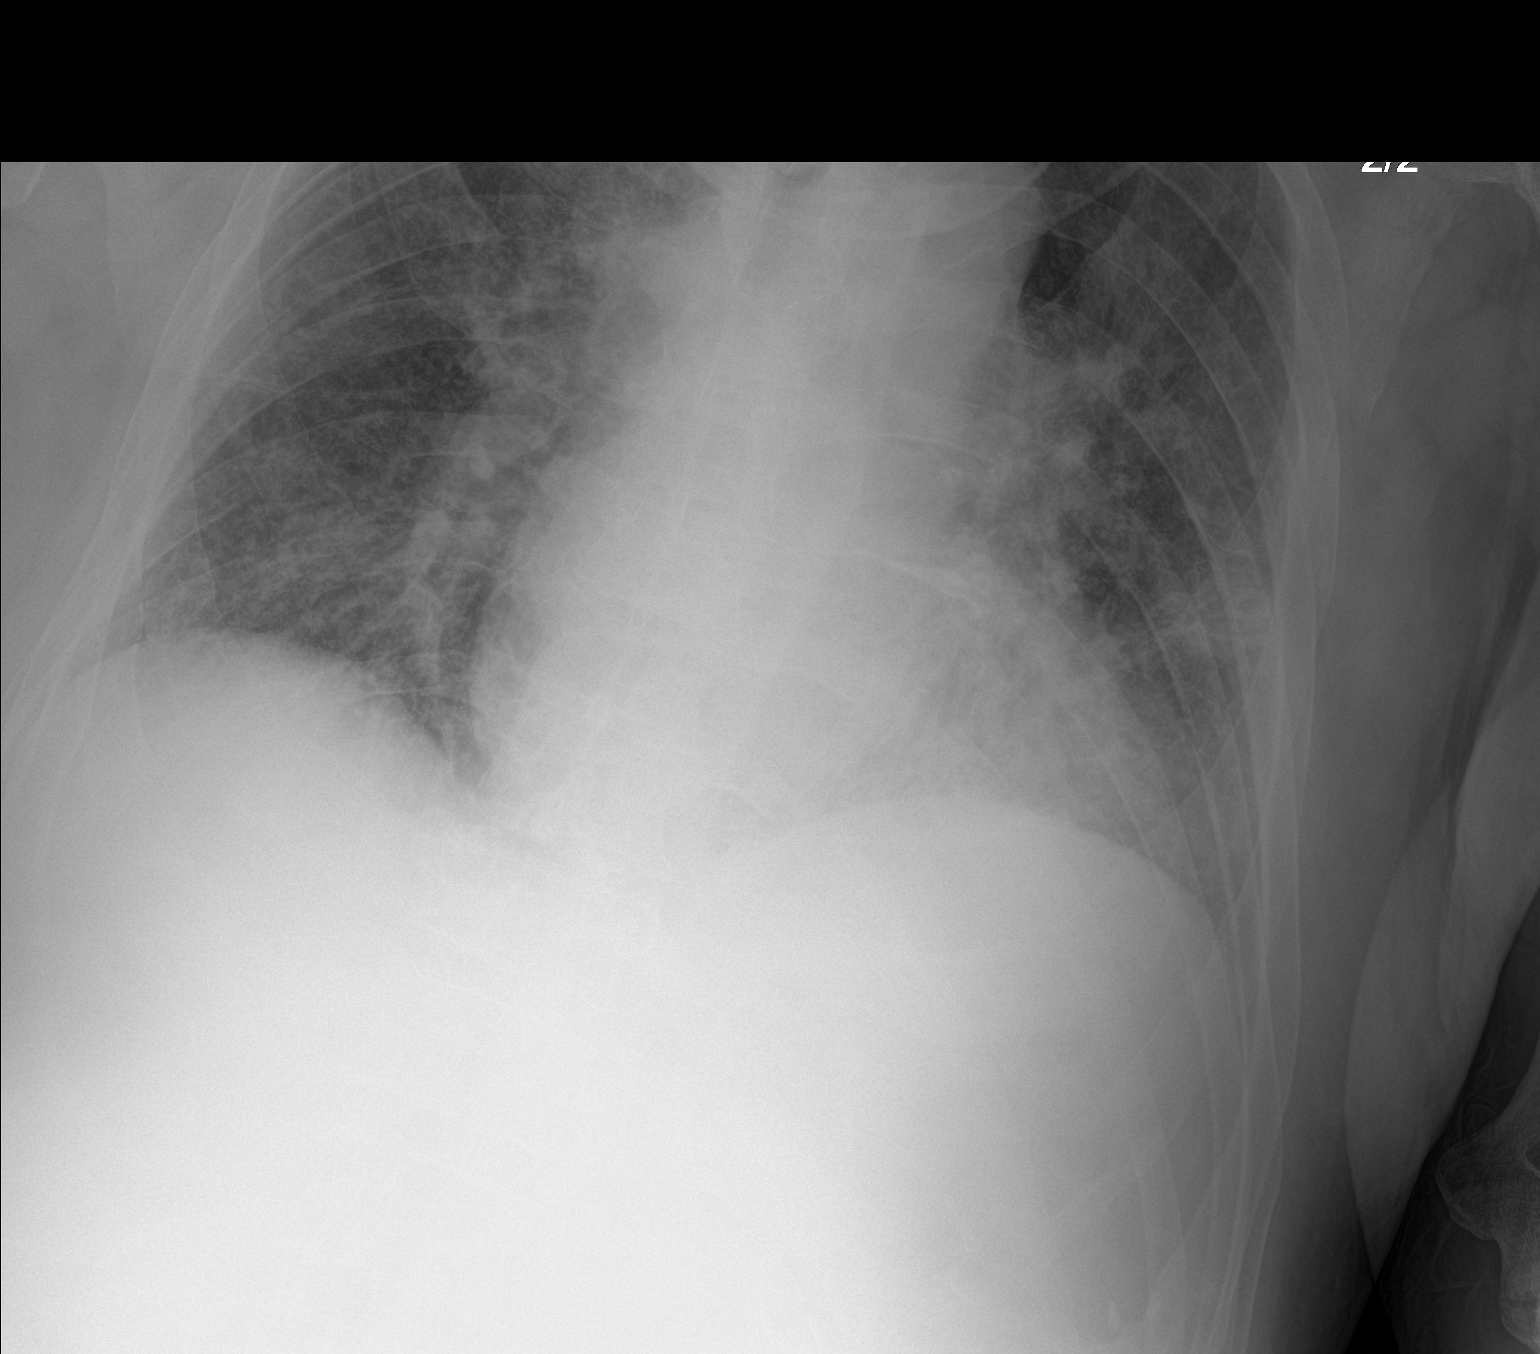

[2 of 2 positions shown; findings below may reference images not displayed]

FINDINGS: Tracheostomy tube tip about 2.4 cm superior to the carina and
projects over the tracheal air column. Cardiomegaly with central
vascular congestion and mild interstitial edema. Streaky opacity at
the left base may reflect atelectasis or an infiltrate. Old right
clavicular fracture and old rib fractures.
IMPRESSION: Tracheostomy tube tip projects over tracheal air column and tip is
about 2.4 cm superior to carina

Cardiomegaly with central vascular congestion and mild diffuse
edema. Streaky atelectasis or pneumonia at the left base.

## 2019-05-06 IMAGING — DX DG CHEST 1V PORT
1 series · 1 of 1 positions shown · non-contrast
Comparison: 03/17/2017; 03/06/2017; 01/27/2017

CLINICAL DATA: Shortness of breath

EXAM:
PORTABLE CHEST 1 VIEW

[chest ap]
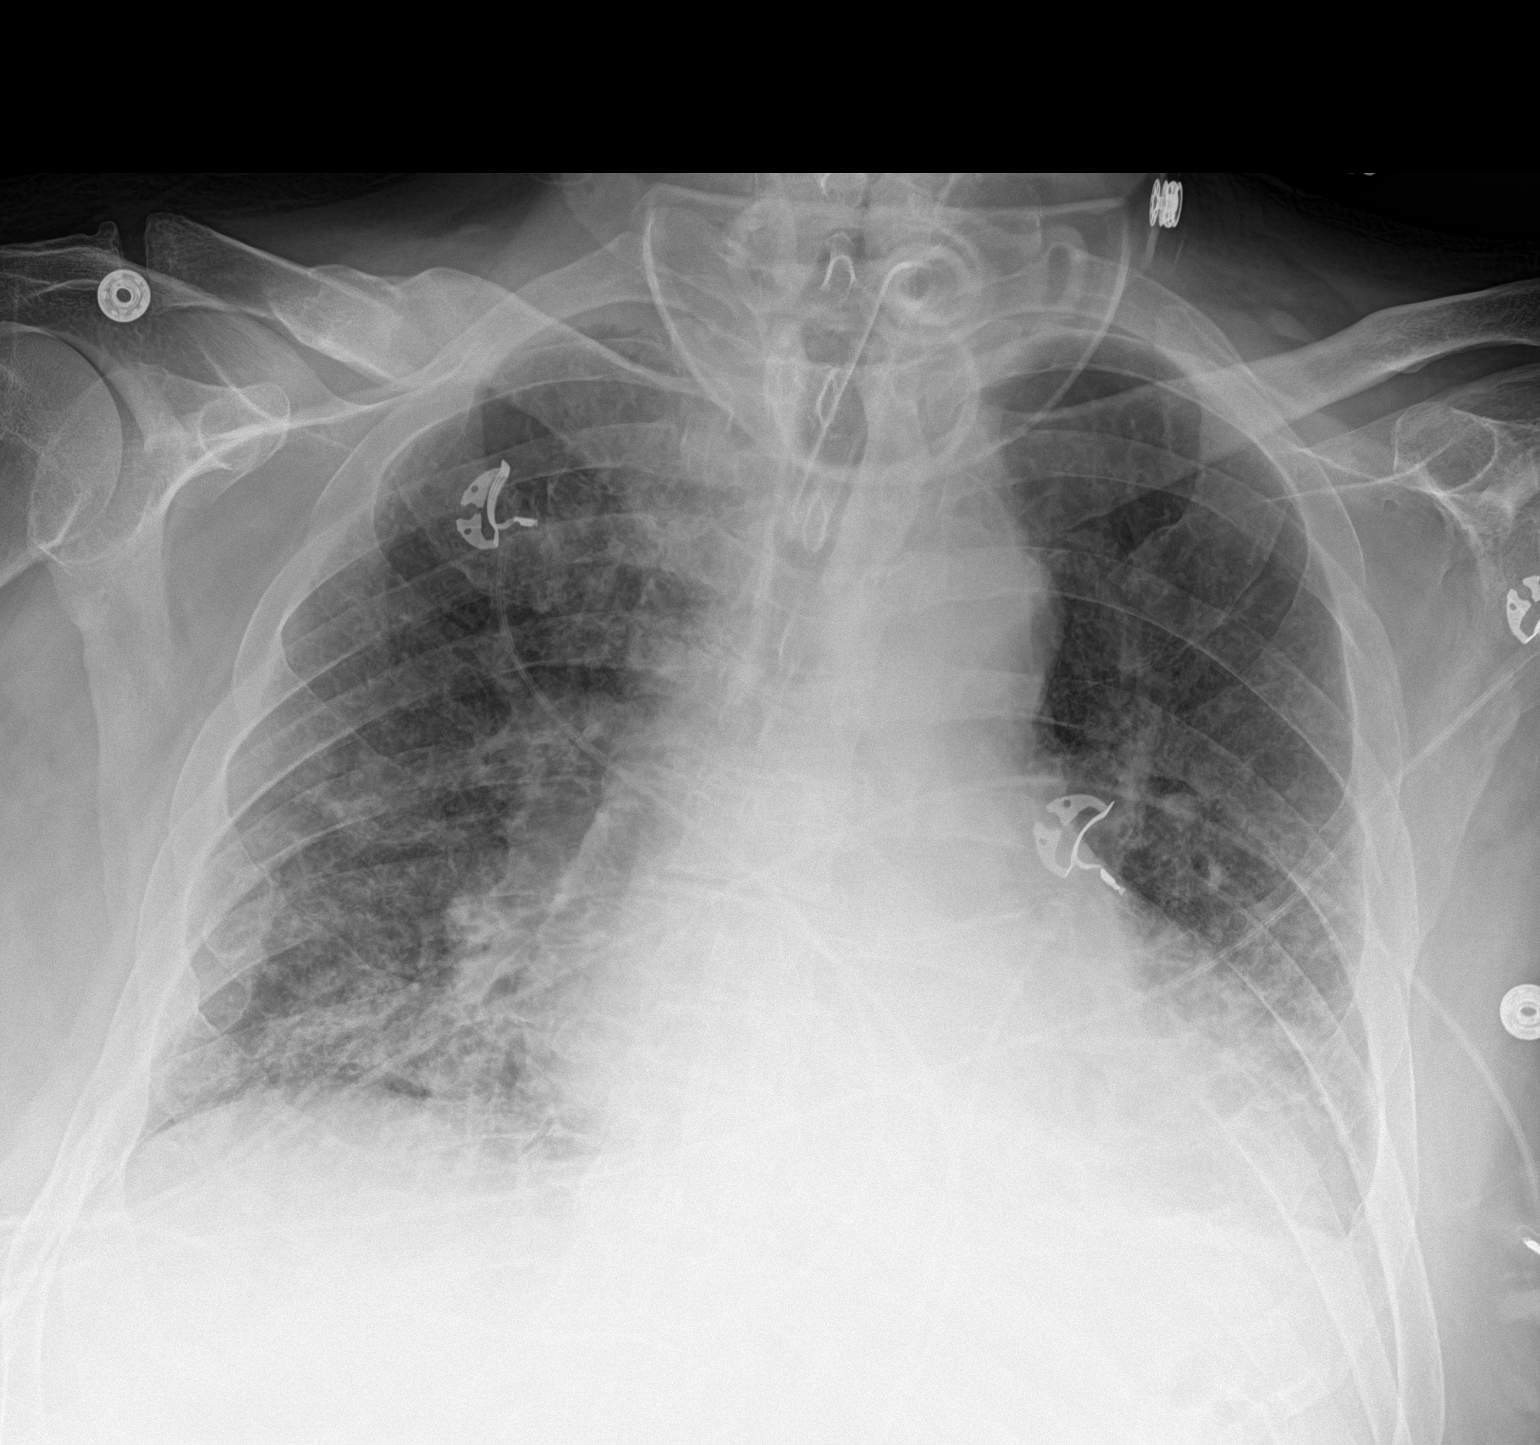

[1 of 1 positions shown; findings below may reference images not displayed]

FINDINGS: Unchanged enlarged cardiac silhouette and mediastinal contours.
Stable position of support apparatus. Pulmonary vasculature appears
less distinct with cephalization of flow. Interval development of
trace right and small left-sided effusion with associated worsening
bibasilar opacities, left greater than right. No pneumothorax. No
acute osseus abnormalities. Old right anterior rib fractures.
IMPRESSION: 1.  No acute cardiopulmonary disease.
2. Suspected worsening pulmonary edema and bibasilar atelectasis,
left greater than right.

## 2019-05-09 IMAGING — DX DG CHEST 1V PORT
1 series · 1 of 1 positions shown · non-contrast
Comparison: 03/19/2017; 03/17/2017; 03/06/2017

CLINICAL DATA: Hypoxia

EXAM:
PORTABLE CHEST 1 VIEW

[chest ap]
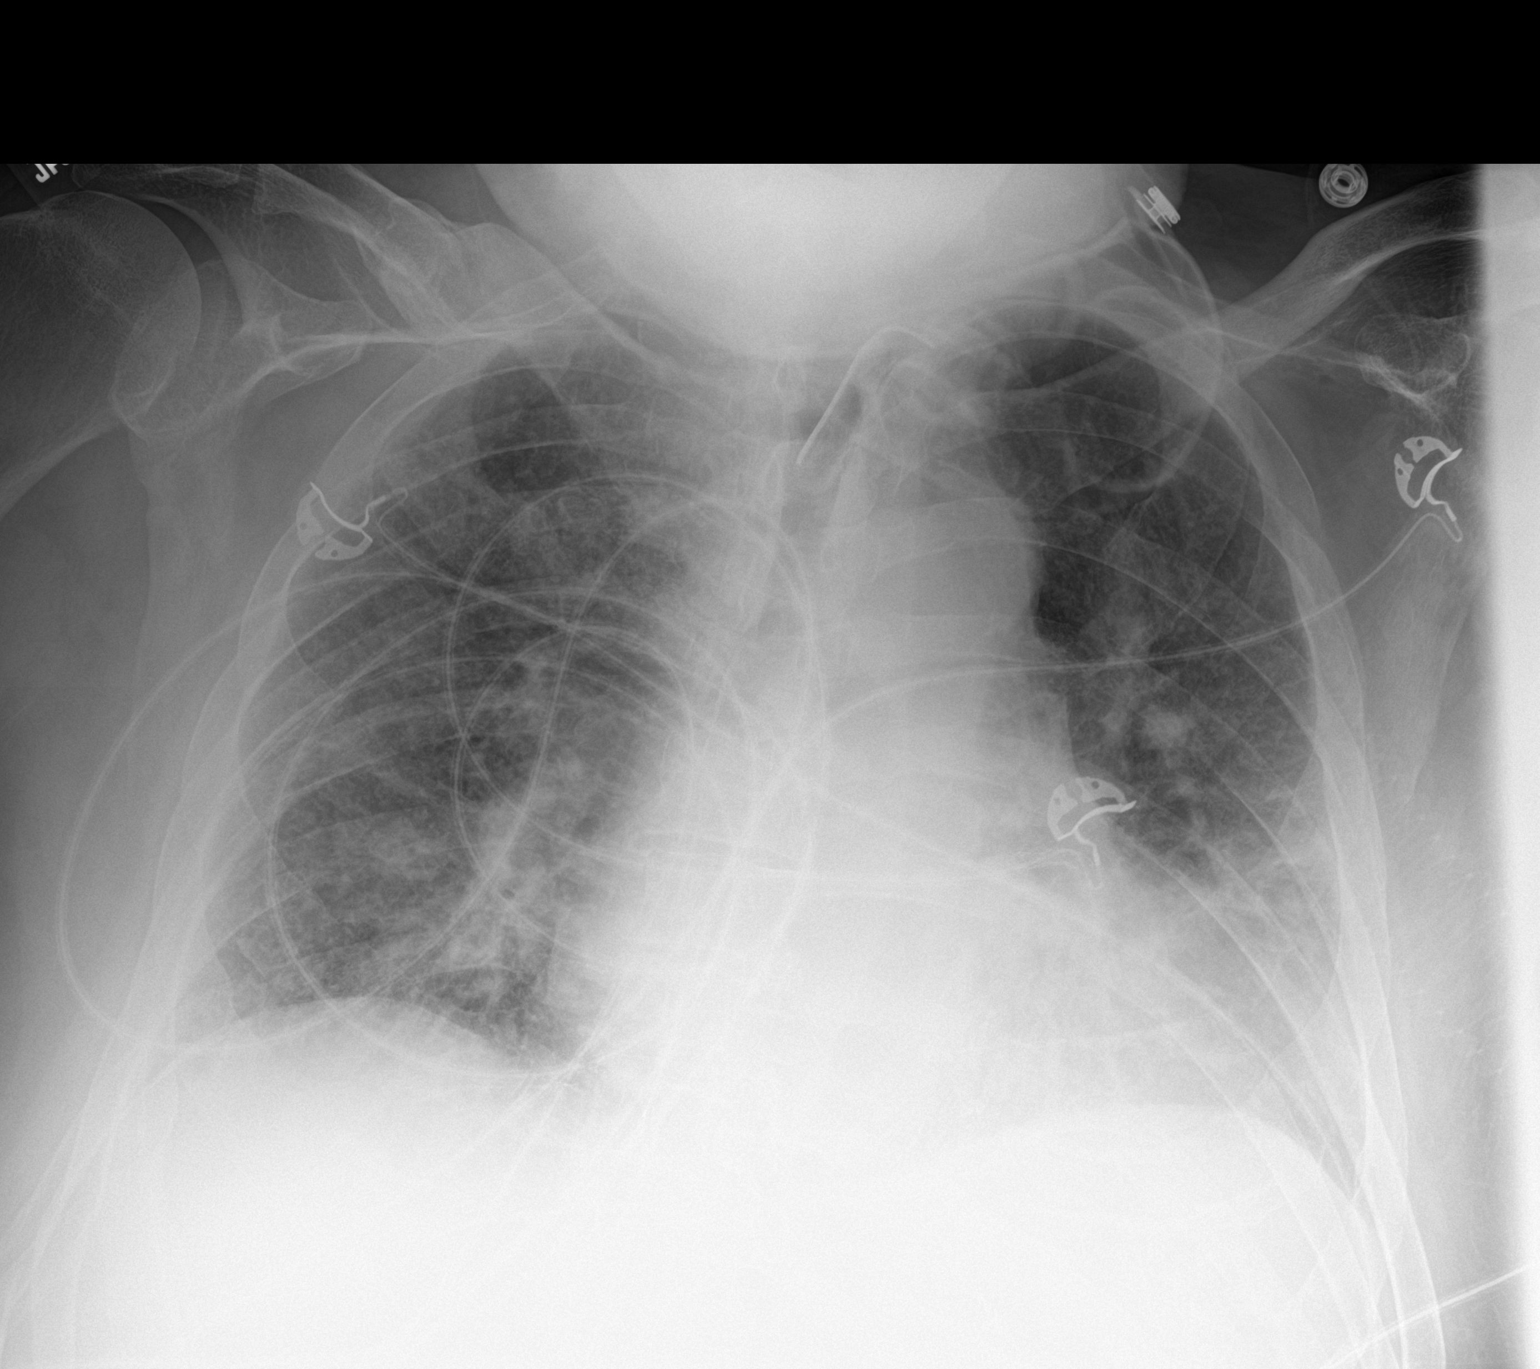

[1 of 1 positions shown; findings below may reference images not displayed]

FINDINGS: Grossly unchanged enlarged cardiac silhouette and mediastinal
contours given AP projection. Stable position of support apparatus.
Pulmonary vasculature remains indistinct with cephalization of flow.
Minimally improved aeration of lungs with persistent bibasilar
opacities, left greater than right. No new focal airspace opacities.
No definite pleural effusion or pneumothorax. No acute osseus
abnormalities.
IMPRESSION: 1.  Stable positioning of support apparatus.  No pneumothorax.
2. Minimally improved bibasilar atelectasis with otherwise similar
findings of mild pulmonary edema.

## 2019-05-11 IMAGING — DX DG CHEST 1V PORT
1 series · 1 of 1 positions shown · non-contrast
Comparison: Two days ago

CLINICAL DATA: Hypoxia

EXAM:
PORTABLE CHEST 1 VIEW

[chest ap]
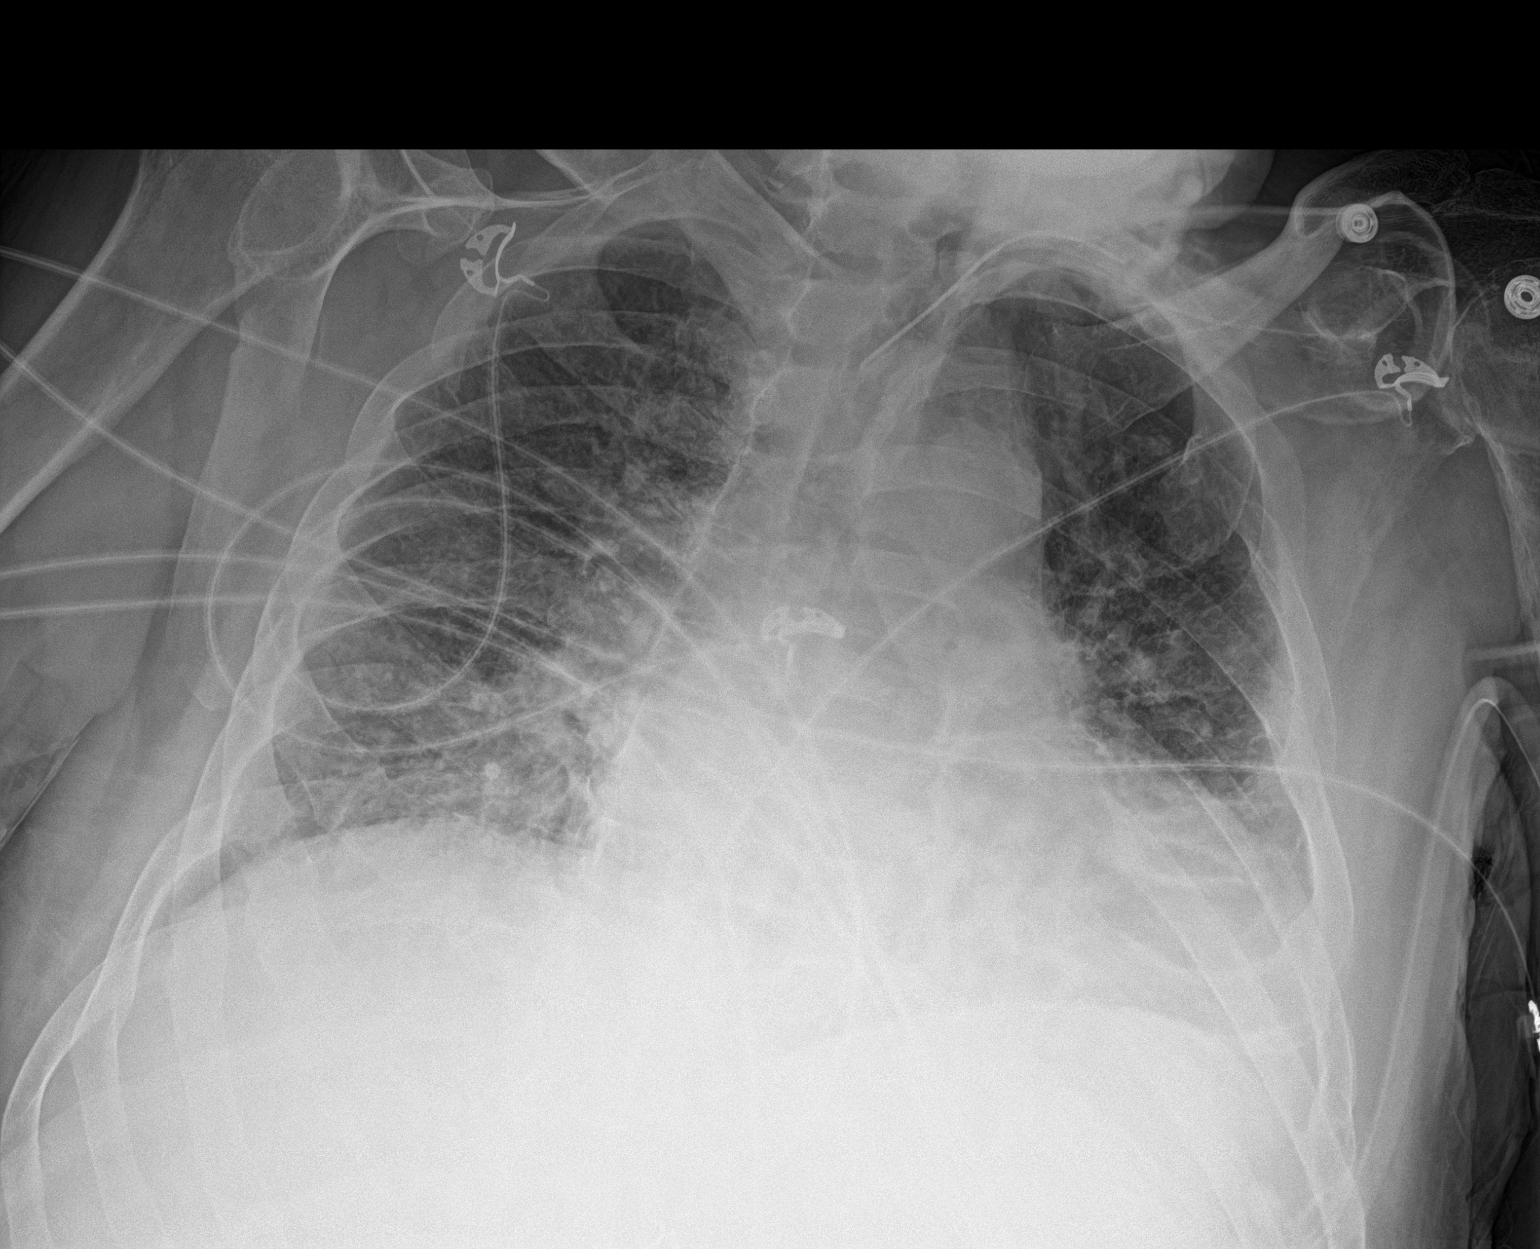

[1 of 1 positions shown; findings below may reference images not displayed]

FINDINGS: Tracheostomy tube in place.

Cardiomegaly and diffuse interstitial opacity. Low lung volumes.
Small layering pleural effusions are not excluded. No evidence of
pneumothorax.
IMPRESSION: 1. Cardiomegaly and mild pulmonary edema.
2. Low volume chest with atelectasis.
3. No change from study 2 days prior.

## 2019-05-14 IMAGING — DX DG CHEST 1V PORT
1 series · 1 of 1 positions shown · non-contrast
Comparison: March 24, 2017

CLINICAL DATA: Shortness of Breath

EXAM:
PORTABLE CHEST 1 VIEW

[chest ap]
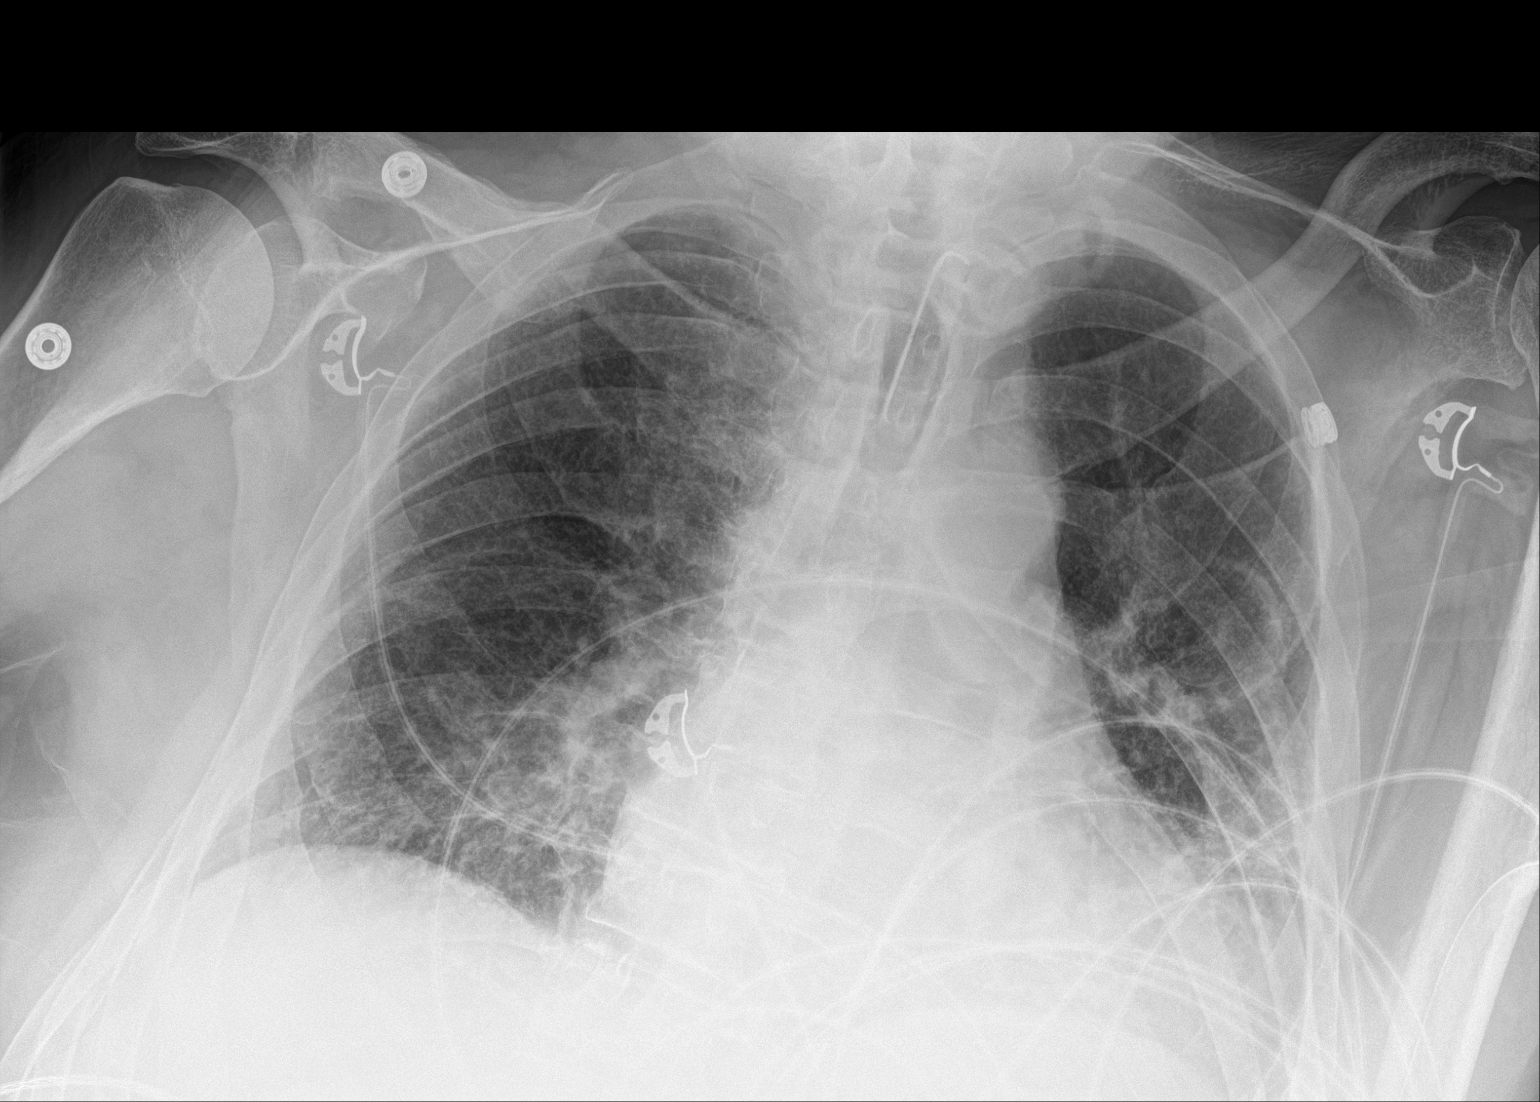

[1 of 1 positions shown; findings below may reference images not displayed]

FINDINGS: Tracheostomy catheter tip is 4.5 cm above the carina. No
pneumothorax. There is patchy airspace consolidation in the left
base. There is mild bibasilar atelectasis as well. Heart is
borderline enlarged with pulmonary vascularity within normal limits.
No adenopathy. No bone lesions.
IMPRESSION: Tracheostomy as described without pneumothorax. Patchy airspace
opacity left base, suspect a degree of pneumonia. Patchy atelectasis
in the bases as well. Stable cardiac silhouette.

## 2019-05-16 IMAGING — CT CT CHEST W/O CM
2 of 4 series · 14 of 36 positions shown, 17 images · non-contrast
Comparison: 12/23/2016

CLINICAL DATA: Shortness of breath, history respiratory failure
requiring increased oxygen usage today, tracheostomy dependent,
denies chest pain

EXAM:
CT CHEST WITHOUT CONTRAST
TECHNIQUE: Multidetector CT imaging of the chest was performed following the
standard protocol without IV contrast. Sagittal and coronal MPR
images reconstructed from axial data set.

[Series 6: chest w/o 2mm st · axial · non-contrast · 0.74mm/px · z∈[-529,-309]mm · 11 of 132 slices shown, 14 images]
[im 11/132  mediastinal]
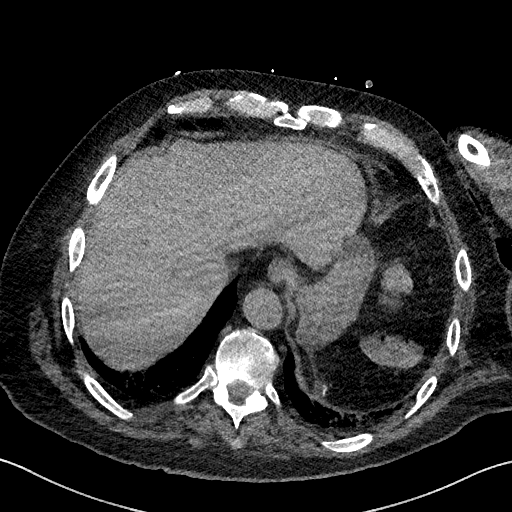
[im 11/132  lung]
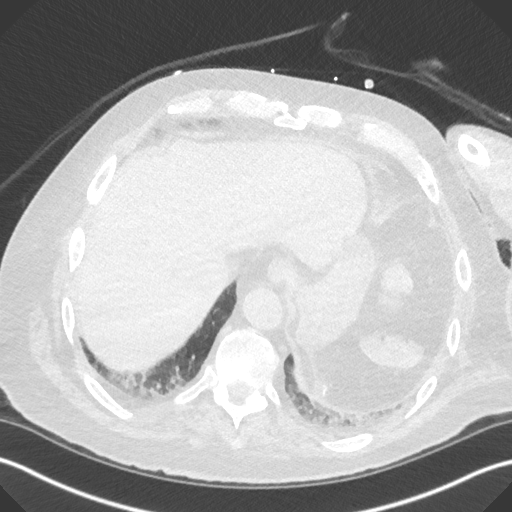
[im 22/132  lung]
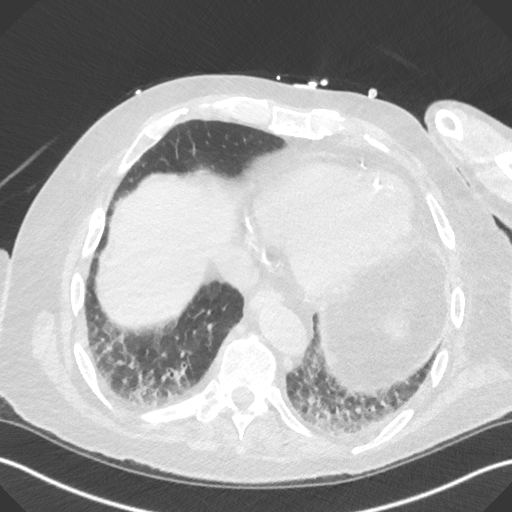
[im 33/132  lung]
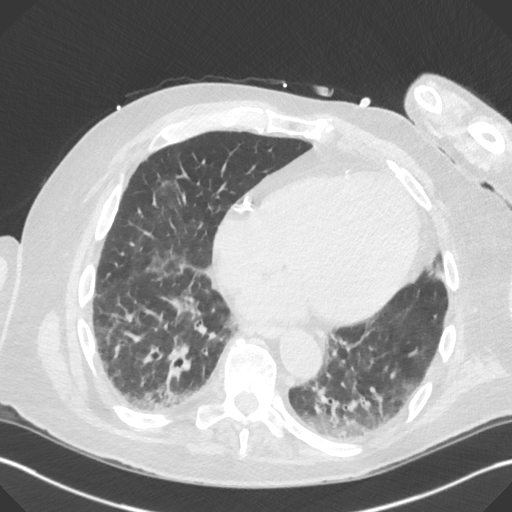
[im 44/132  lung]
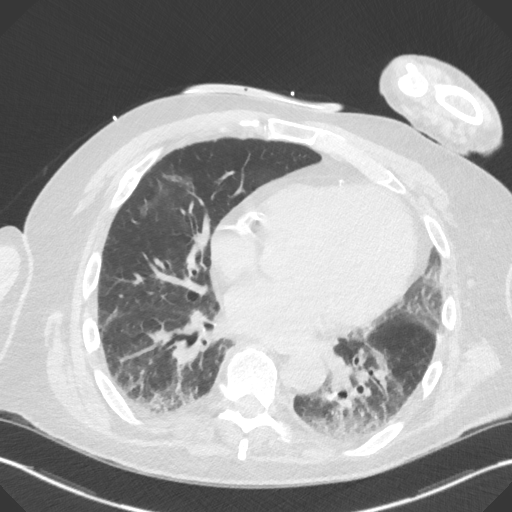
[im 55/132  mediastinal]
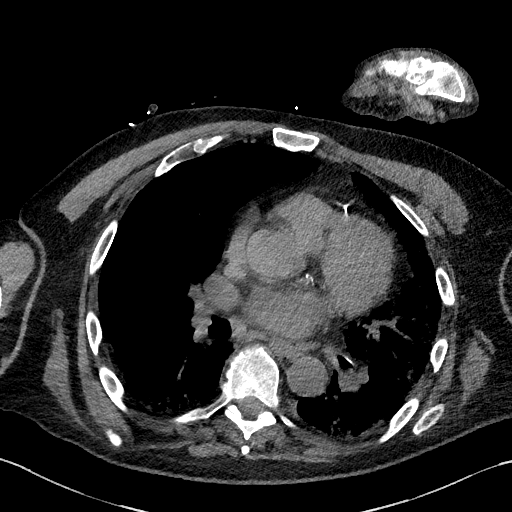
[im 55/132  lung]
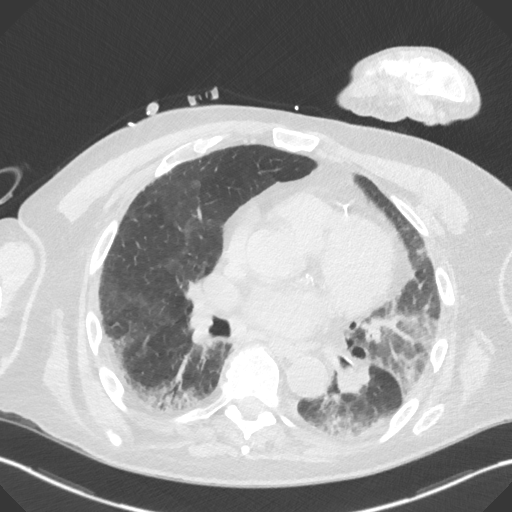
[im 66/132  lung]
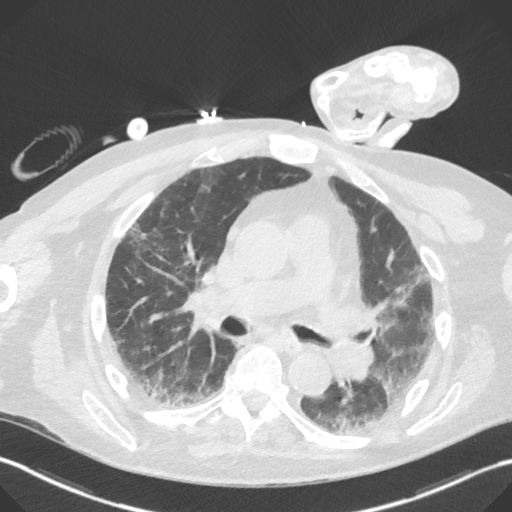
[im 77/132  lung]
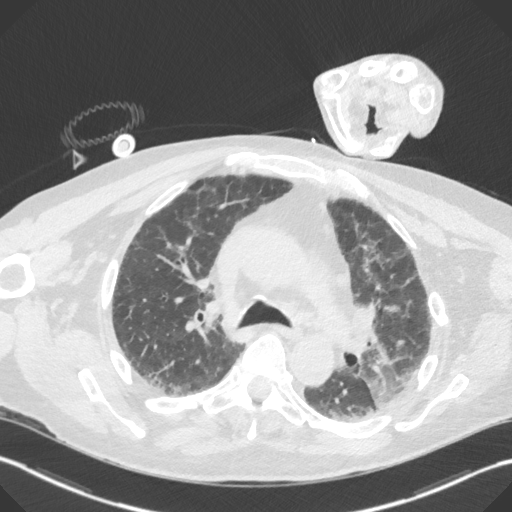
[im 88/132  lung]
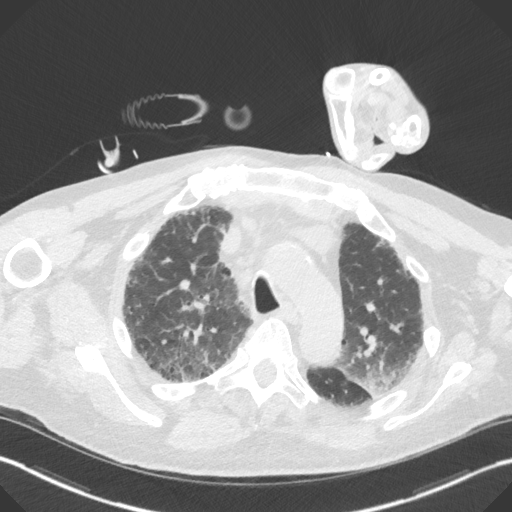
[im 99/132  mediastinal]
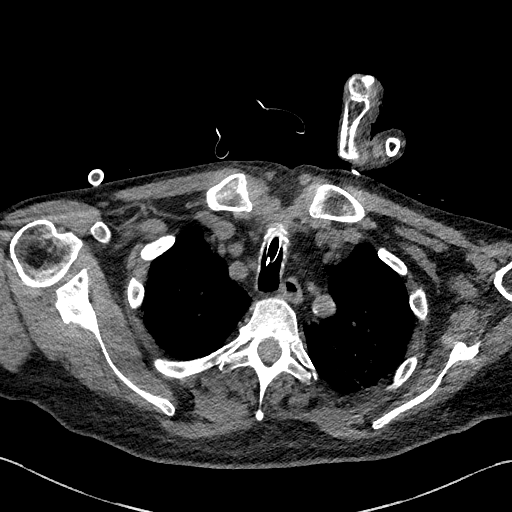
[im 99/132  lung]
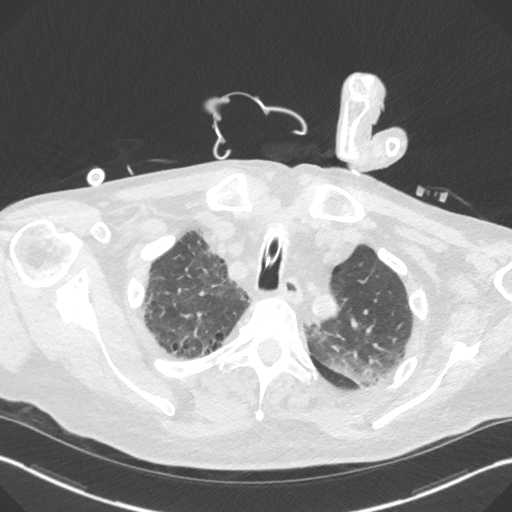
[im 110/132  lung]
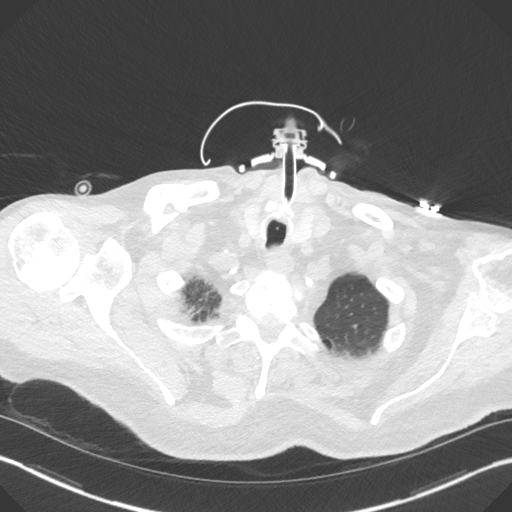
[im 121/132  lung]
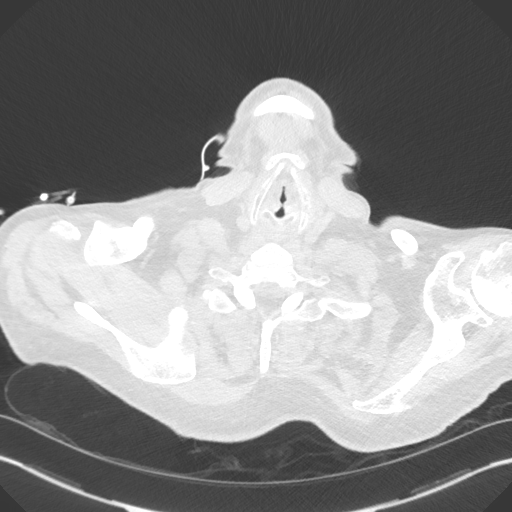

[Series 9: chest w/o 3mm st cor · coronal · non-contrast · 0.57mm/px · 3 of 101 slices shown]
[im 21/101  lung]
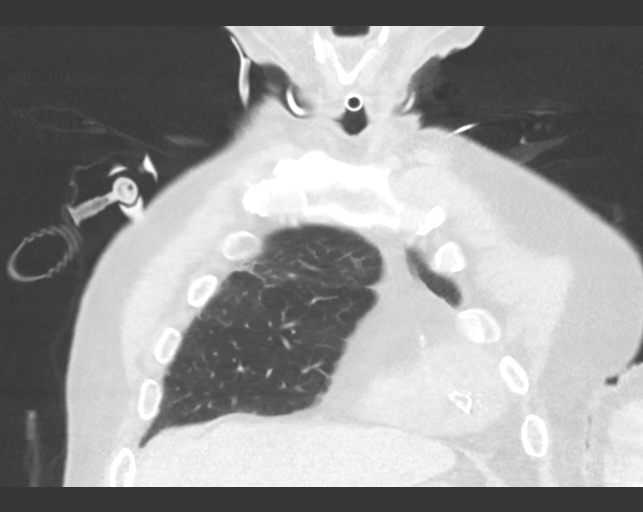
[im 41/101  lung]
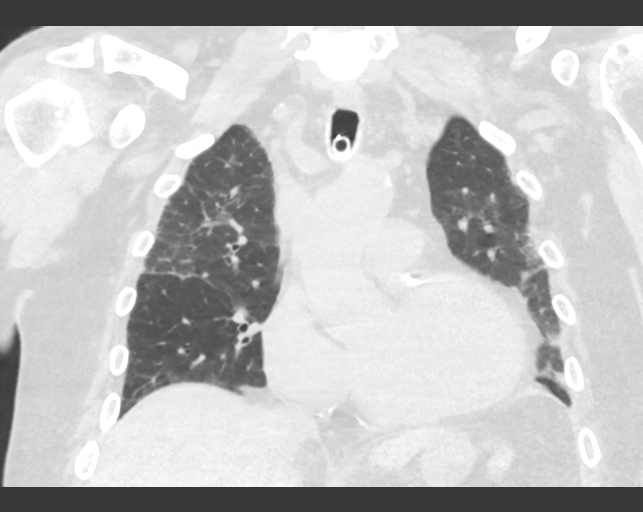
[im 61/101  lung]
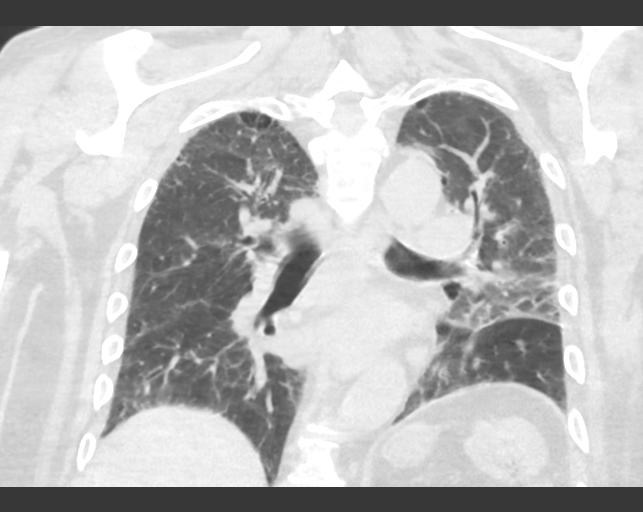

[14 of 36 positions shown; findings below may reference images not displayed]

FINDINGS: Cardiovascular: Atherosclerotic calcifications aorta, proximal great
vessels, and coronary arteries ascending aorta aneurysmally dilated
4.5 cm transverse image 62. No pericardial effusion. Calcifications
at cardiac apex question prior MI.

Mediastinum/Nodes: Esophagus unremarkable. Tracheostomy tube within
trachea. Scattered normal sized mediastinal lymph nodes largest 10
mm RIGHT paratracheal node image 47 and 10 mm para-aortic node image
53. Base of cervical region unremarkable.

Lungs/Pleura: Scattered peripheral chronic interstitial infiltrates
in both lungs. Dependent atelectasis in both lower lobes improved
since previous exam. Central peribronchial thickening greatest in
lower lobes. Scattered subpleural blebs at the apices greater on
RIGHT. RIGHT upper lobe nodular density 9 mm diameter image 42
unchanged. No pleural effusion or pneumothorax.

Upper Abdomen: Gastrostomy tube within collapsed stomach. Post
splenectomy with question splenule at splenectomy bed. Visualized
upper abdomen otherwise unremarkable.

Musculoskeletal: Old appearing compression deformity of an upper
thoracic vertebra approximately T6. Bones demineralized. No other
focal bony abnormalities.
IMPRESSION: Aortic atherosclerosis and coronary arterial calcifications
aneurysmal dilatation of the ascending thoracic aorta 4.5 cm
transverse, recommendation below.

Ascending thoracic aortic aneurysm. Recommend semi-annual imaging
followup by CTA or MRA and referral to cardiothoracic surgery if not
already obtained. This recommendation follows 6090
ACCF/AHA/AATS/ACR/ASA/SCA/JACOBO/JANOVICS/VASU/ARGURIS Guidelines for the
Diagnosis and Management of Patients With Thoracic Aortic Disease.
Circulation. 6090; 121: [id number]

Mural calcification at cardiac apex question prior MI.

Bronchitic changes with scattered chronic peripheral interstitial
infiltrates in both lungs and improved bibasilar atelectasis.

No definite acute infiltrate.

Stable 9 mm RIGHT upper lobe nodule.

Upper normal sized mediastinal lymph nodes without definite thoracic
adenopathy.

## 2019-05-25 IMAGING — CT CT ABD-PELV W/O CM
2 of 4 series · 16 of 46 positions shown, 18 images · non-contrast
Comparison: 01/30/2017

CLINICAL DATA: Anemia history of aortic aneurysm

EXAM:
CT ABDOMEN AND PELVIS WITHOUT CONTRAST
TECHNIQUE: Multidetector CT imaging of the abdomen and pelvis was performed
following the standard protocol without IV contrast.

[Series 3: a/p w/o 5mm · axial · non-contrast · 0.83mm/px · z∈[+895,+1365]mm · 13 of 102 slices shown, 15 images]
[im 4/102  soft-tissue]
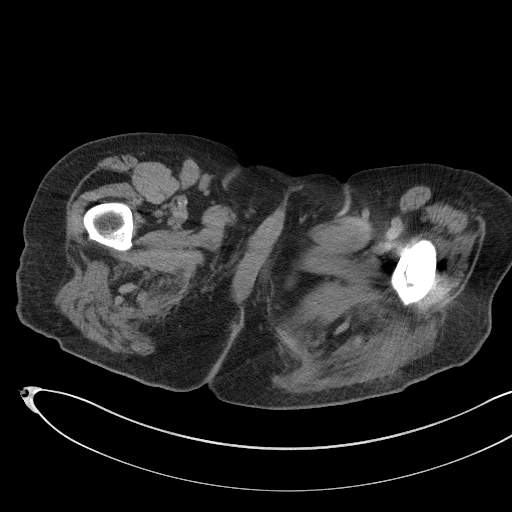
[im 4/102  bone]
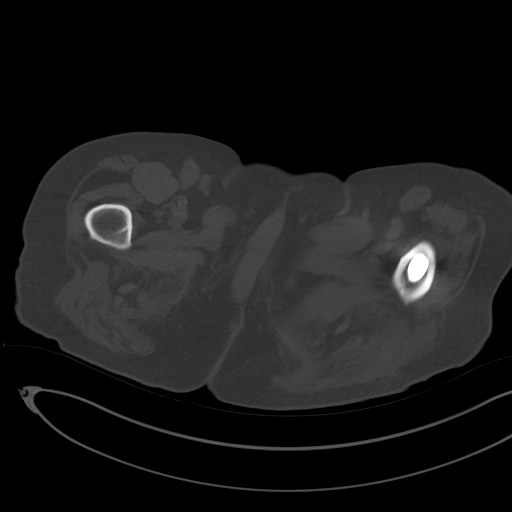
[im 12/102  soft-tissue]
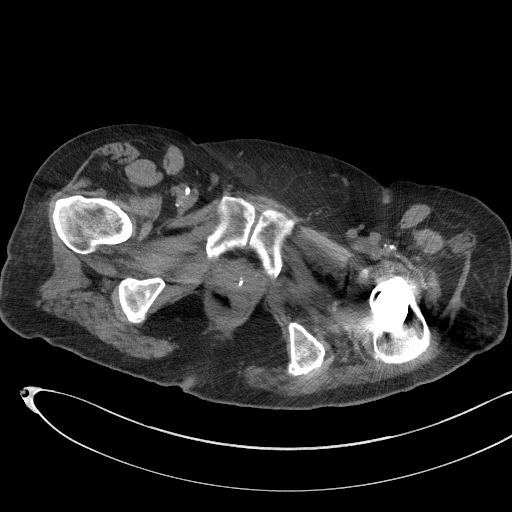
[im 20/102  soft-tissue]
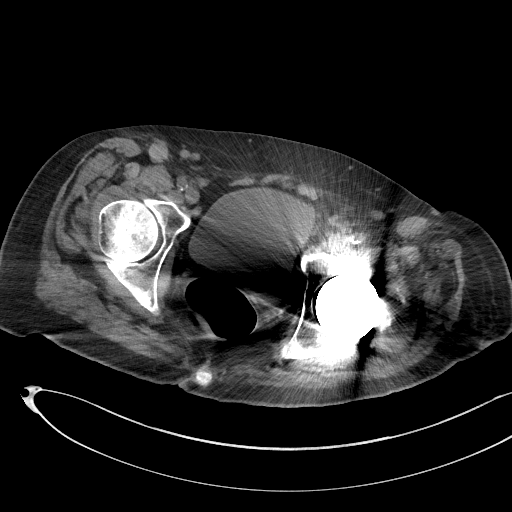
[im 28/102  soft-tissue]
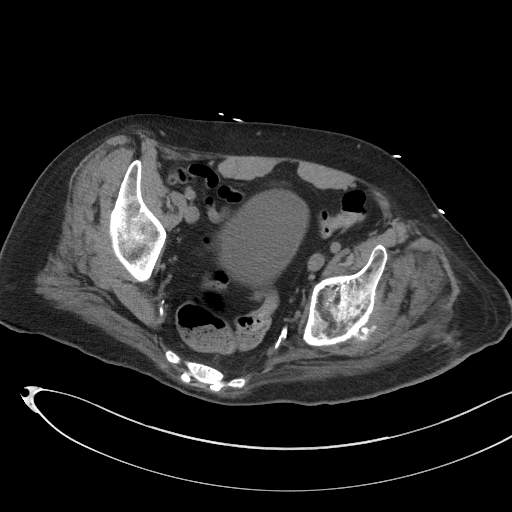
[im 35/102  soft-tissue]
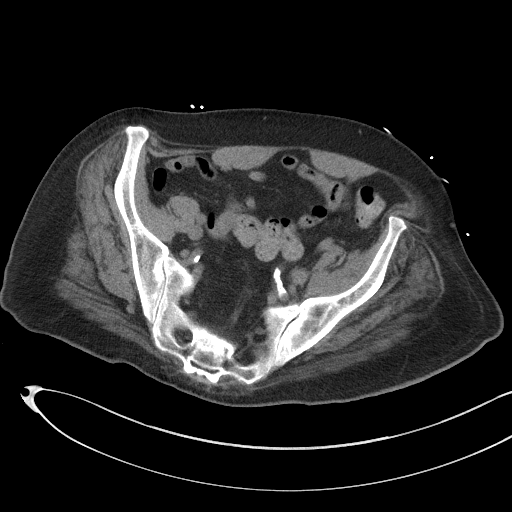
[im 43/102  soft-tissue]
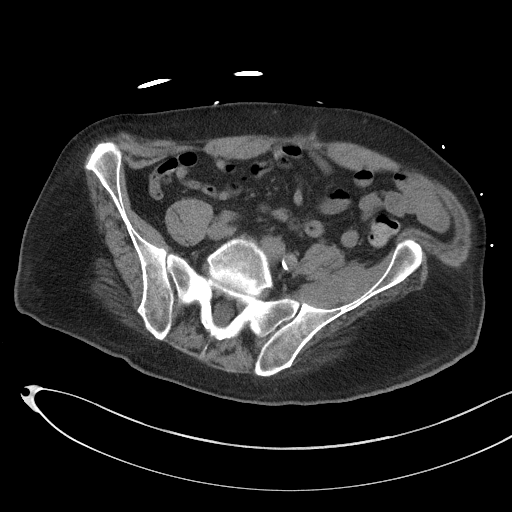
[im 51/102  soft-tissue]
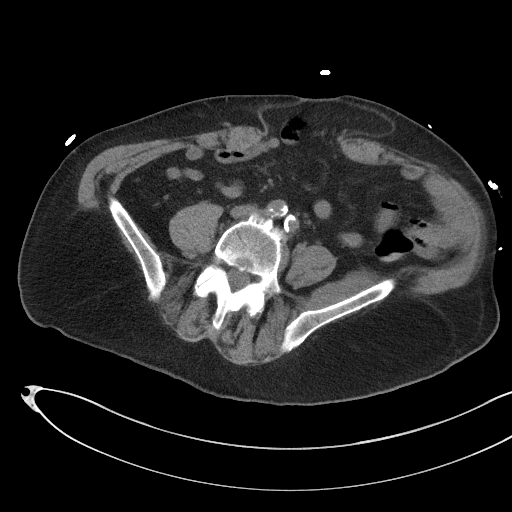
[im 59/102  soft-tissue]
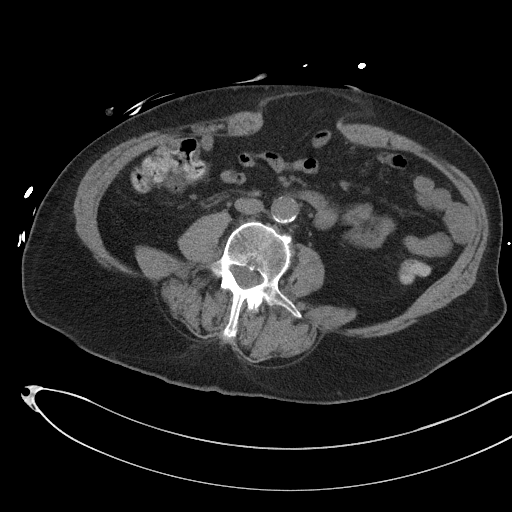
[im 67/102  soft-tissue]
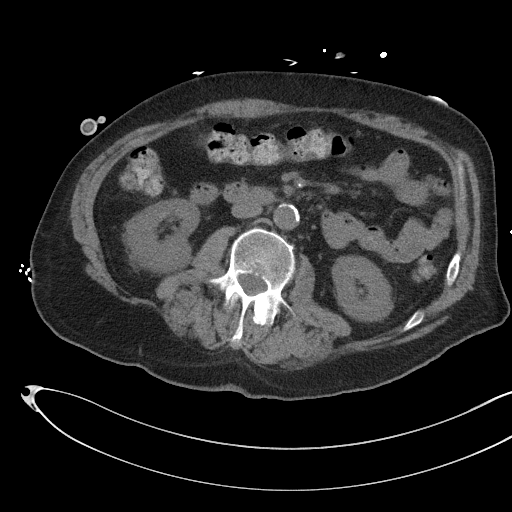
[im 67/102  bone]
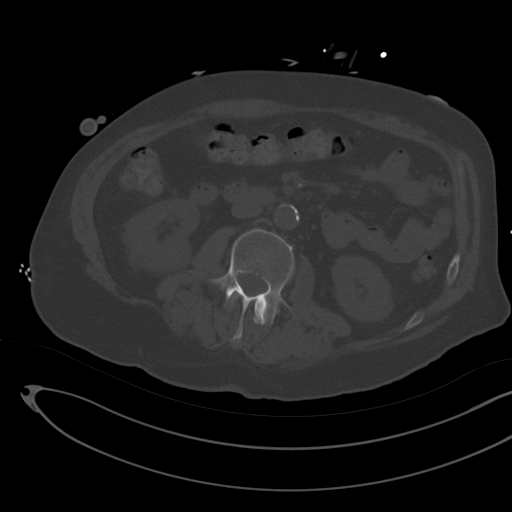
[im 74/102  soft-tissue]
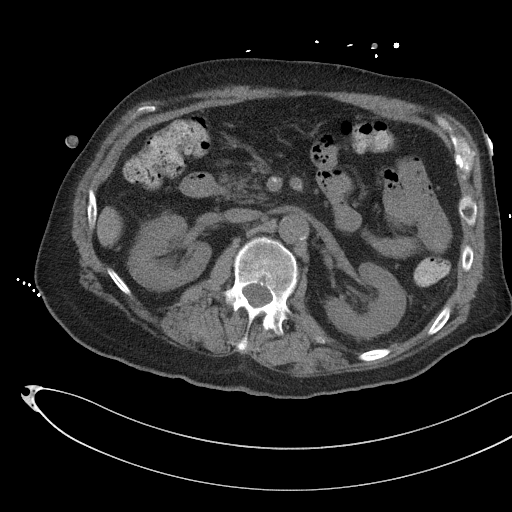
[im 82/102  soft-tissue]
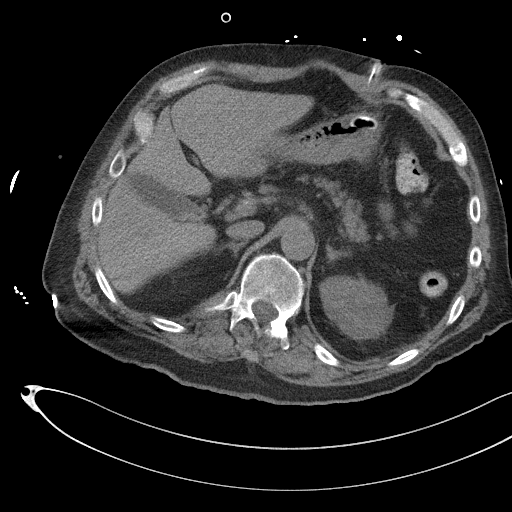
[im 90/102  soft-tissue]
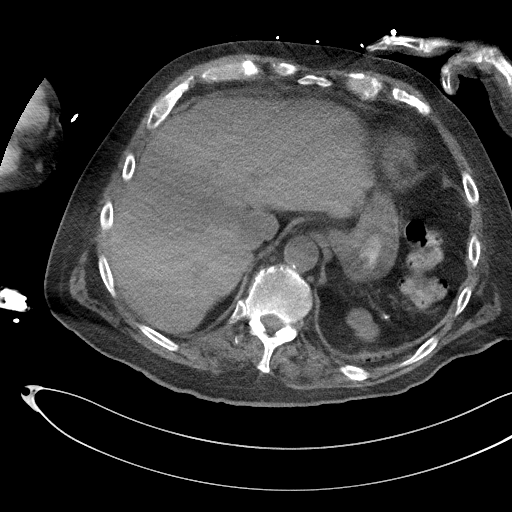
[im 98/102  soft-tissue]
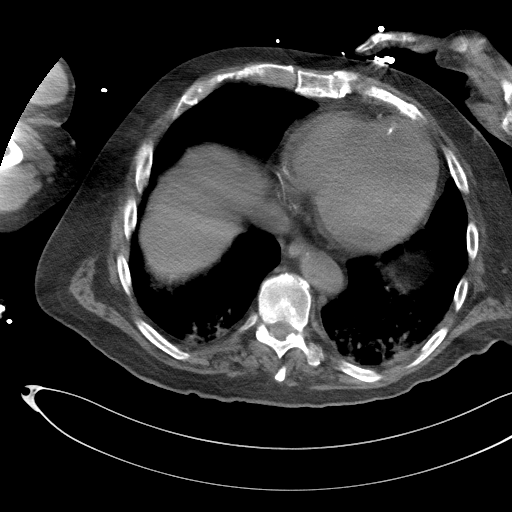

[Series 6: a/p w/o cor · coronal · non-contrast · 0.95mm/px · 3 of 165 slices shown]
[im 55/165  soft-tissue]
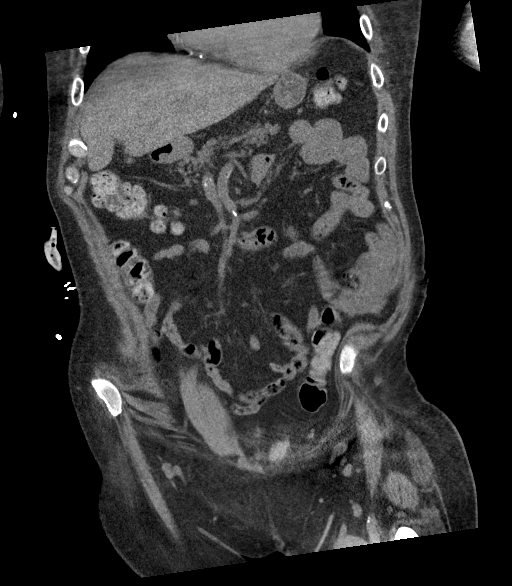
[im 73/165  soft-tissue]
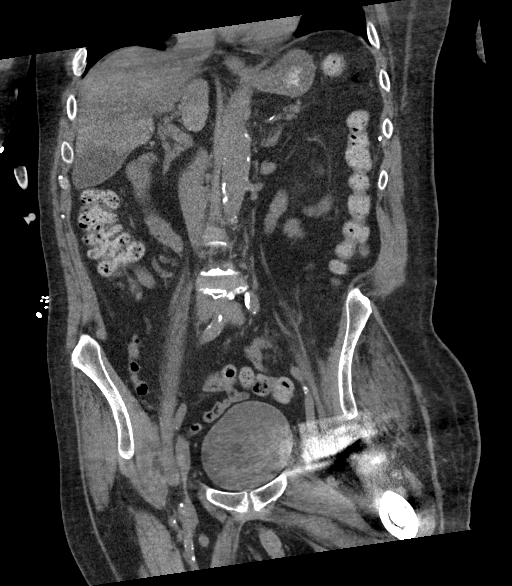
[im 92/165  soft-tissue]
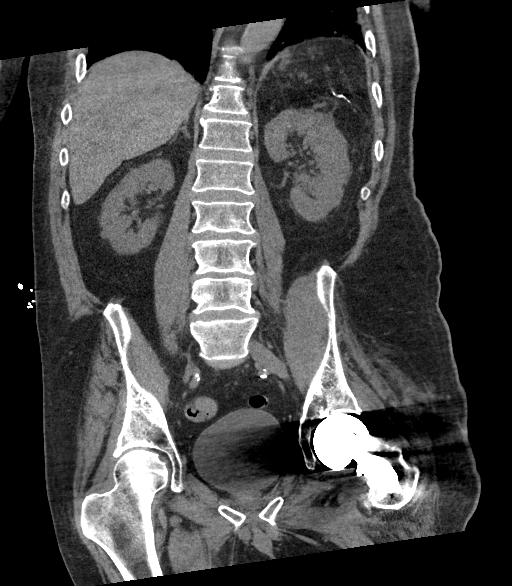

[16 of 46 positions shown; findings below may reference images not displayed]

FINDINGS: Lower chest: Patchy bibasilar opacities. No pleural effusion.
Cardiomegaly with coronary artery calcification. Calcifications at
the ventricular apex may relate to old infarct.

Hepatobiliary: No focal hepatic abnormality. Possible small stones
or sludge in the gallbladder. No biliary dilatation

Pancreas: Unremarkable. No pancreatic ductal dilatation or
surrounding inflammatory changes.

Spleen: Small splenic tissue in the left upper quadrant with
adjacent surgical change or calcification.

Adrenals/Urinary Tract: Adrenal glands are within normal limits.
Punctate nonobstructing stone in the mid right kidney. No
hydronephrosis. The bladder is unremarkable

Stomach/Bowel: Stomach is nonenlarged. There is a gastrostomy tube.
No dilated small bowel. No colon wall thickening.

Vascular/Lymphatic: Atherosclerotic vascular disease. No aneurysmal
dilatation. No significantly enlarged lymph nodes.

Reproductive: Prostate glands calcifications.

Other: Ventral hernia containing mesenteric fat and small amount of
bowel. No free air or free fluid

Musculoskeletal: Status post left hip replacement with associated
artifact. Probable old fracture left inferior pubic ramus. Old
right-sided rib fractures.
IMPRESSION: 1. Aortic atherosclerotic vascular disease but no aneurysmal
dilatation. No evidence for retroperitoneal mass or hematoma.
2. Patchy bibasilar lung opacities, not significantly changed
3. Punctate nonobstructing stone in the mid right kidney
4. Possible small stones or sludge in the gallbladder
5. Ventral hernia containing mesenteric fat and small amount of
bowel but no evidence for an obstruction

## 2019-05-25 IMAGING — DX DG CHEST 1V PORT
2 series · 2 of 2 positions shown · non-contrast
Comparison: 03/27/2017

CLINICAL DATA: Short of breath fever

EXAM:
PORTABLE CHEST 1 VIEW

[chest ap]
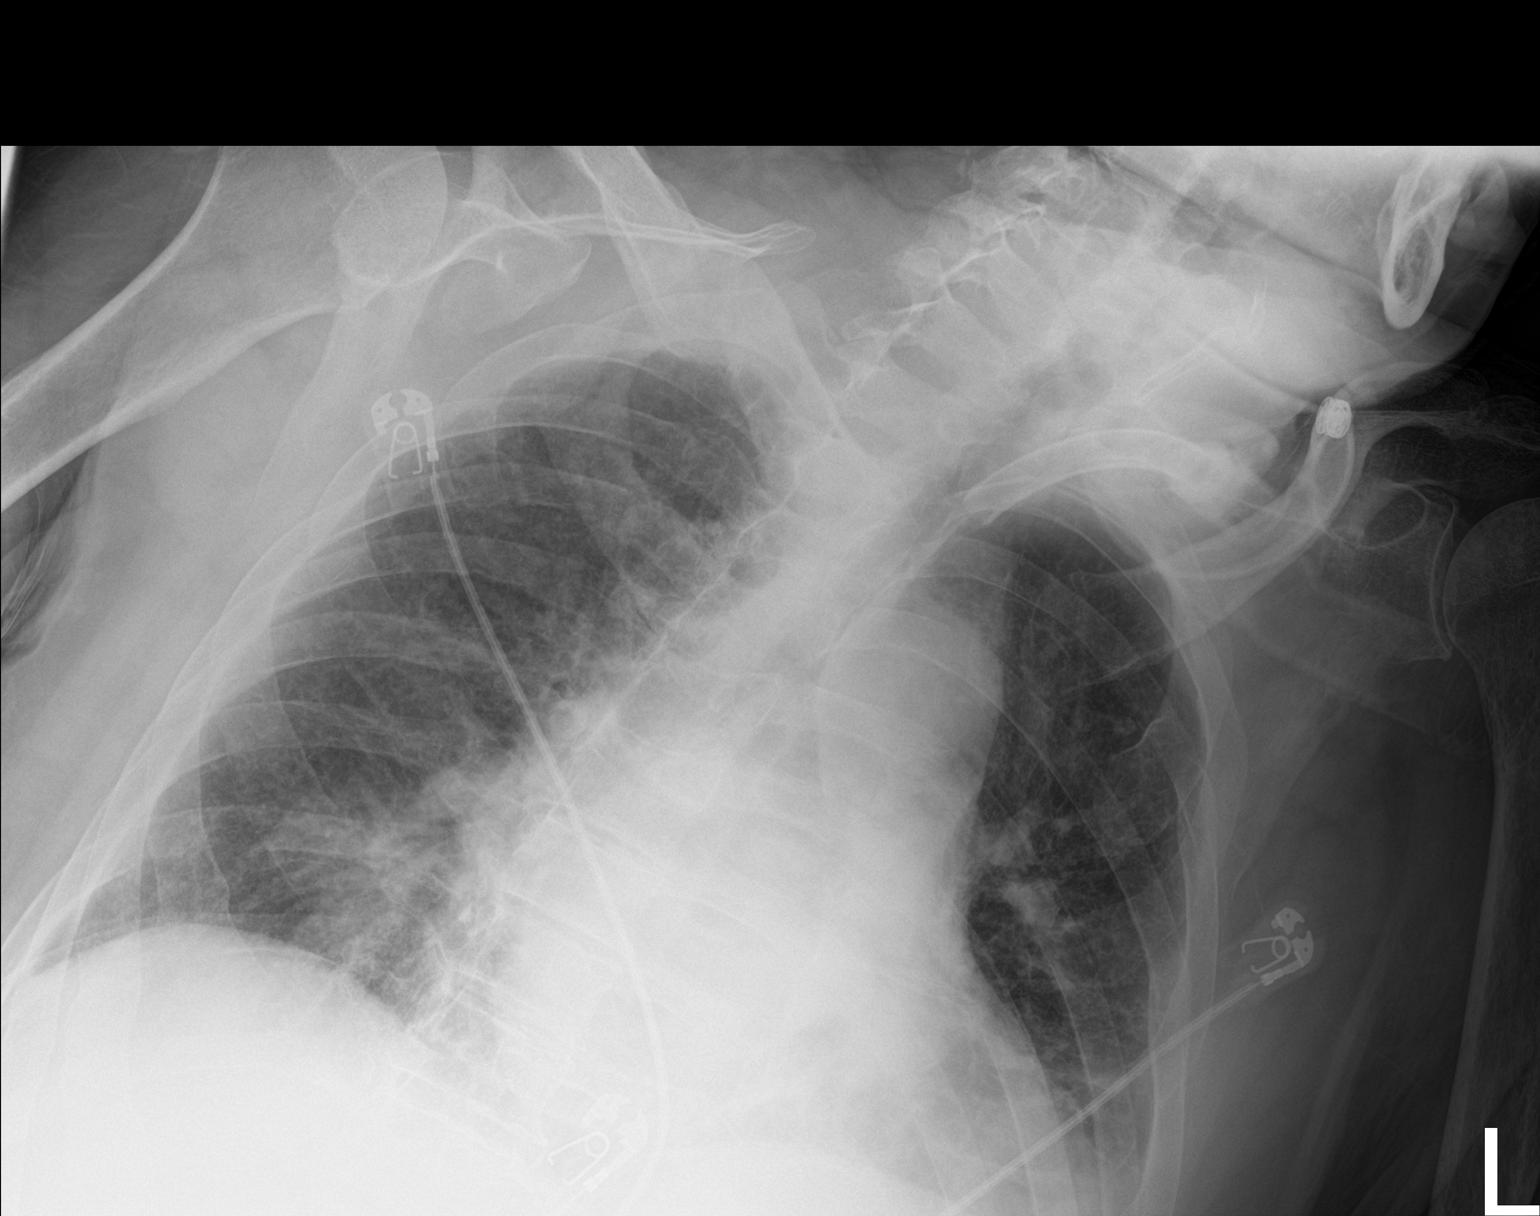

[chest pa]
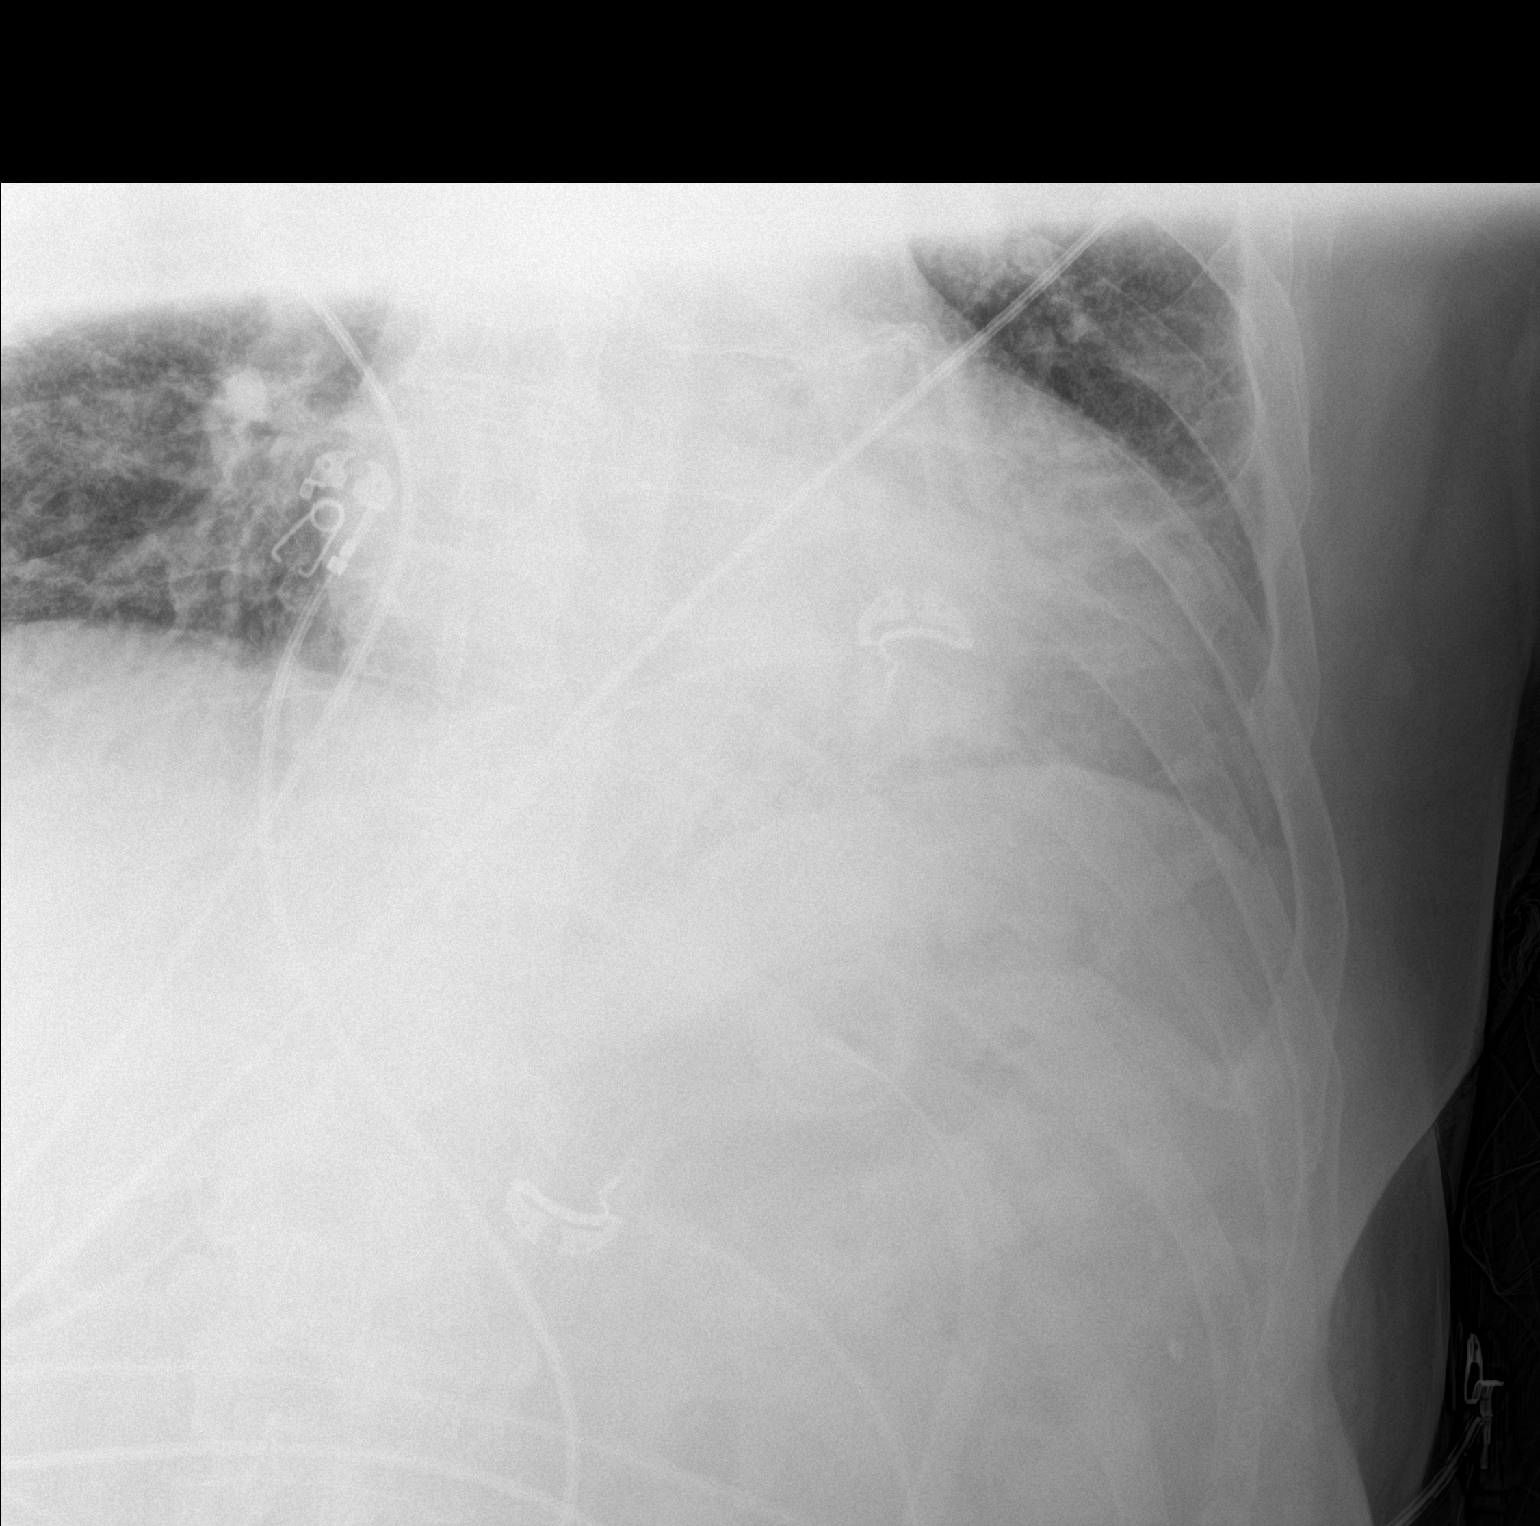

[2 of 2 positions shown; findings below may reference images not displayed]

FINDINGS: Tracheostomy remains in good position. Bibasilar airspace disease is
mildly improved. Negative for heart failure or effusion.
IMPRESSION: Partial clearing of bibasilar atelectasis/infiltrate.

## 2019-05-26 IMAGING — DX DG CHEST 1V PORT
1 series · 1 of 1 positions shown · non-contrast
Comparison: Chest radiograph performed earlier today at [DATE] a.m.

CLINICAL DATA: Tracheostomy tube malfunctioning. Initial encounter.

EXAM:
PORTABLE CHEST 1 VIEW

[chest ap]
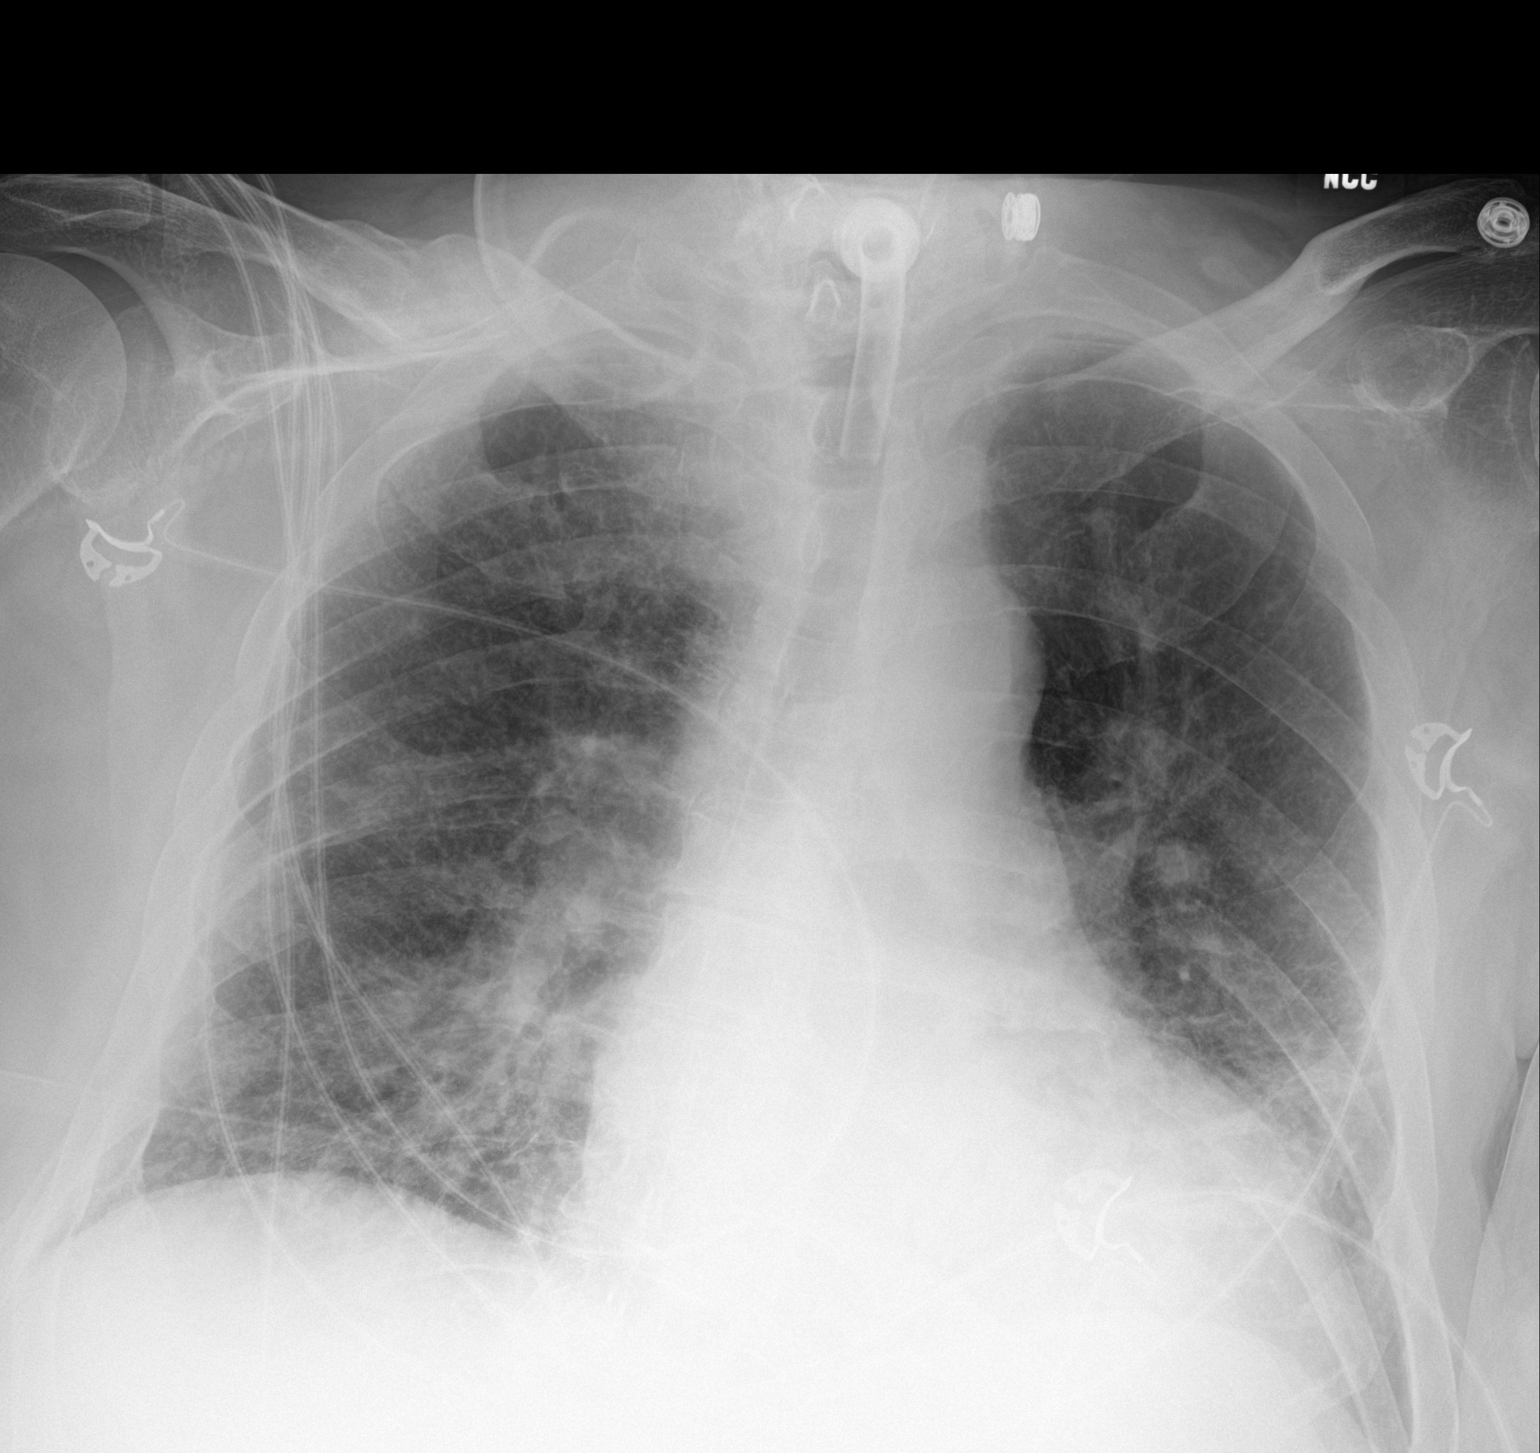

[1 of 1 positions shown; findings below may reference images not displayed]

FINDINGS: The patient's tracheostomy tube is seen ending 7-8 cm above the
carina. It is grossly stable in appearance.

Patchy bibasilar airspace opacities raise concern for multifocal
pneumonia, redistributed from the prior study, though interstitial
edema might have a similar appearance. Underlying vascular
congestion is noted. No pleural effusion or pneumothorax is seen,
though the left costophrenic angle is incompletely imaged on this
study.

The cardiomediastinal silhouette is borderline enlarged. No acute
osseous abnormalities are seen. There is chronic deformity of the
right mid clavicle.
IMPRESSION: 1. Tracheostomy tube seen ending 7-8 cm above the carina. It is
grossly stable in appearance.
2. Patchy bibasilar airspace opacities raise concern for multifocal
pneumonia, redistributed from the recent prior study, though
interstitial edema might have a similar appearance.
3. Underlying vascular congestion and borderline cardiomegaly.

## 2019-05-26 IMAGING — DX DG CHEST 1V PORT
1 series · 2 of 2 positions shown · non-contrast
Comparison: 04/07/2017

CLINICAL DATA: Sepsis

EXAM:
PORTABLE CHEST 1 VIEW

[Series 1: chest ap · 0.14mm/px · 2 of 2 slices shown]
[im 1/2]
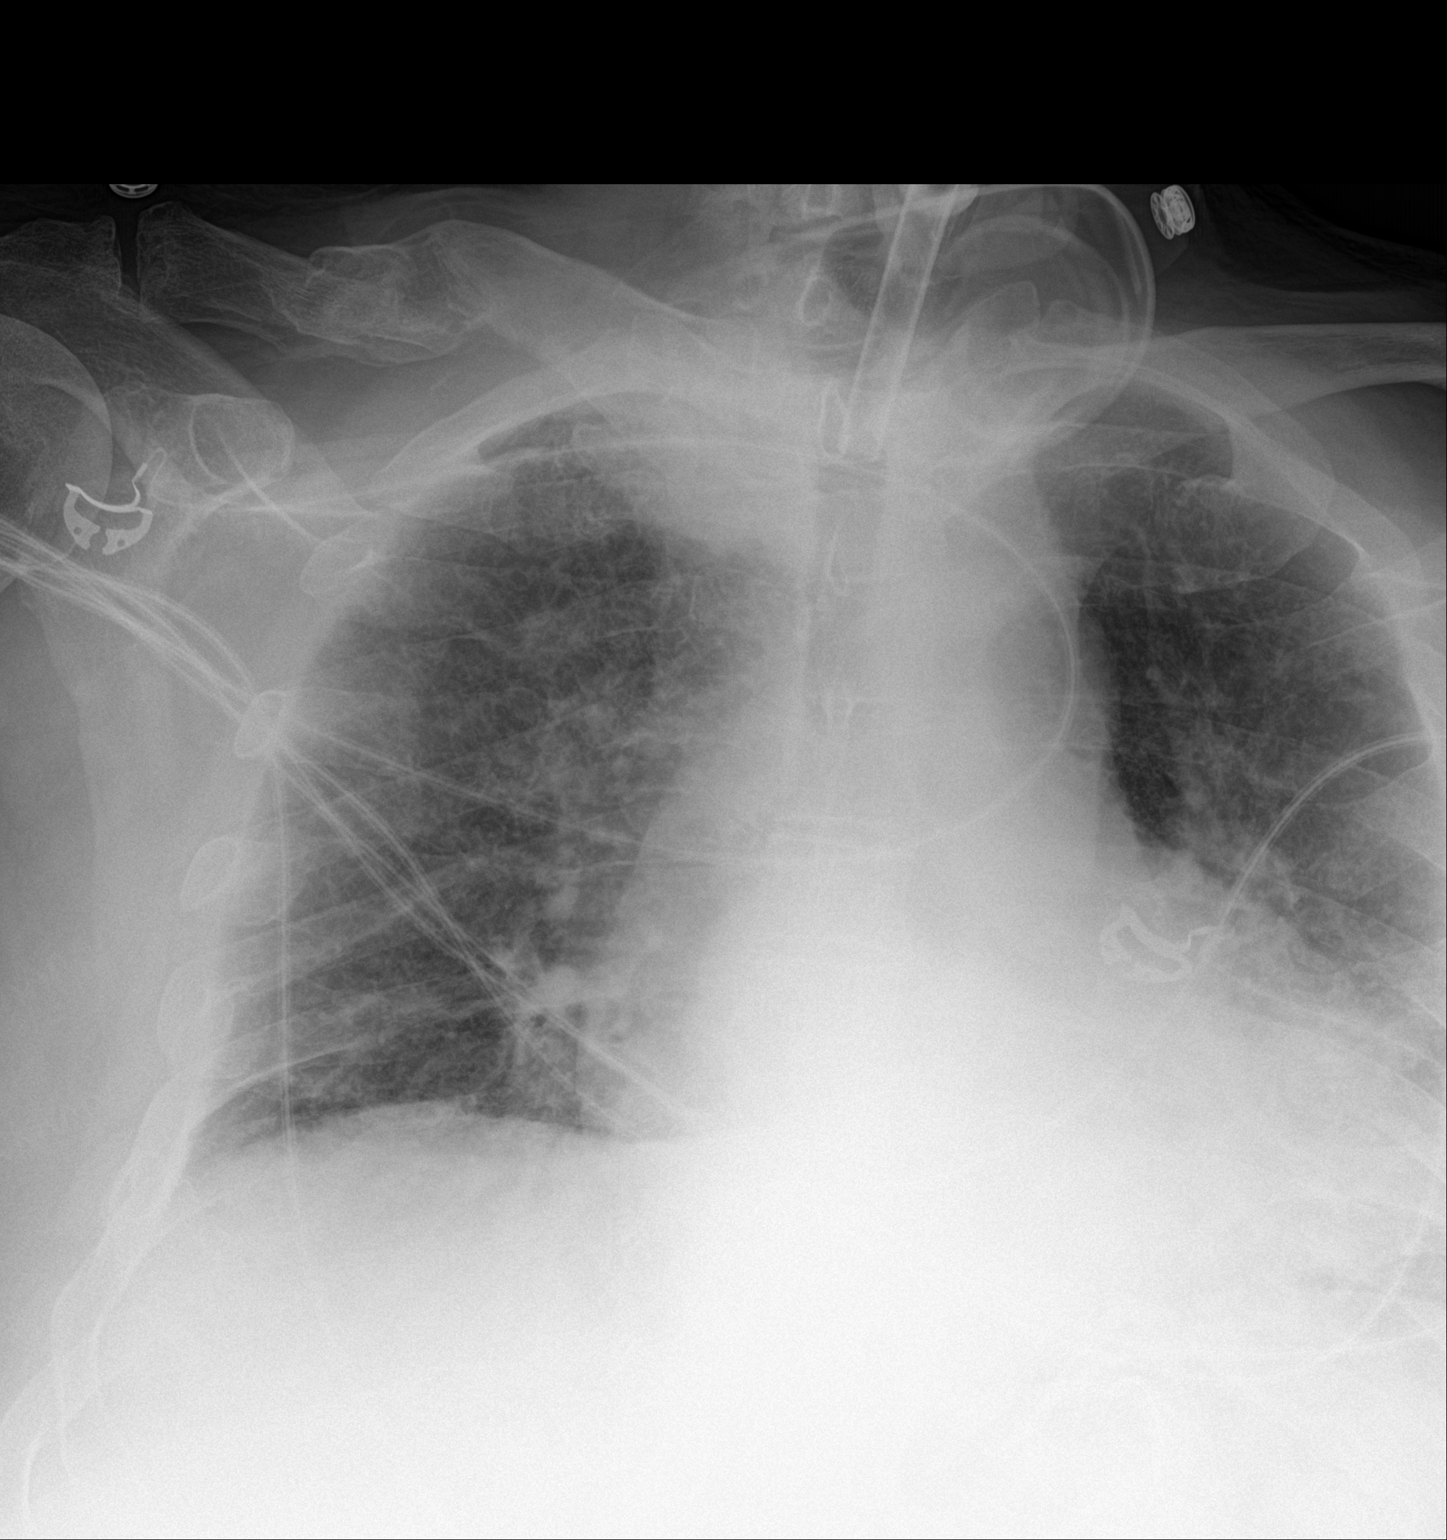
[im 2/2]
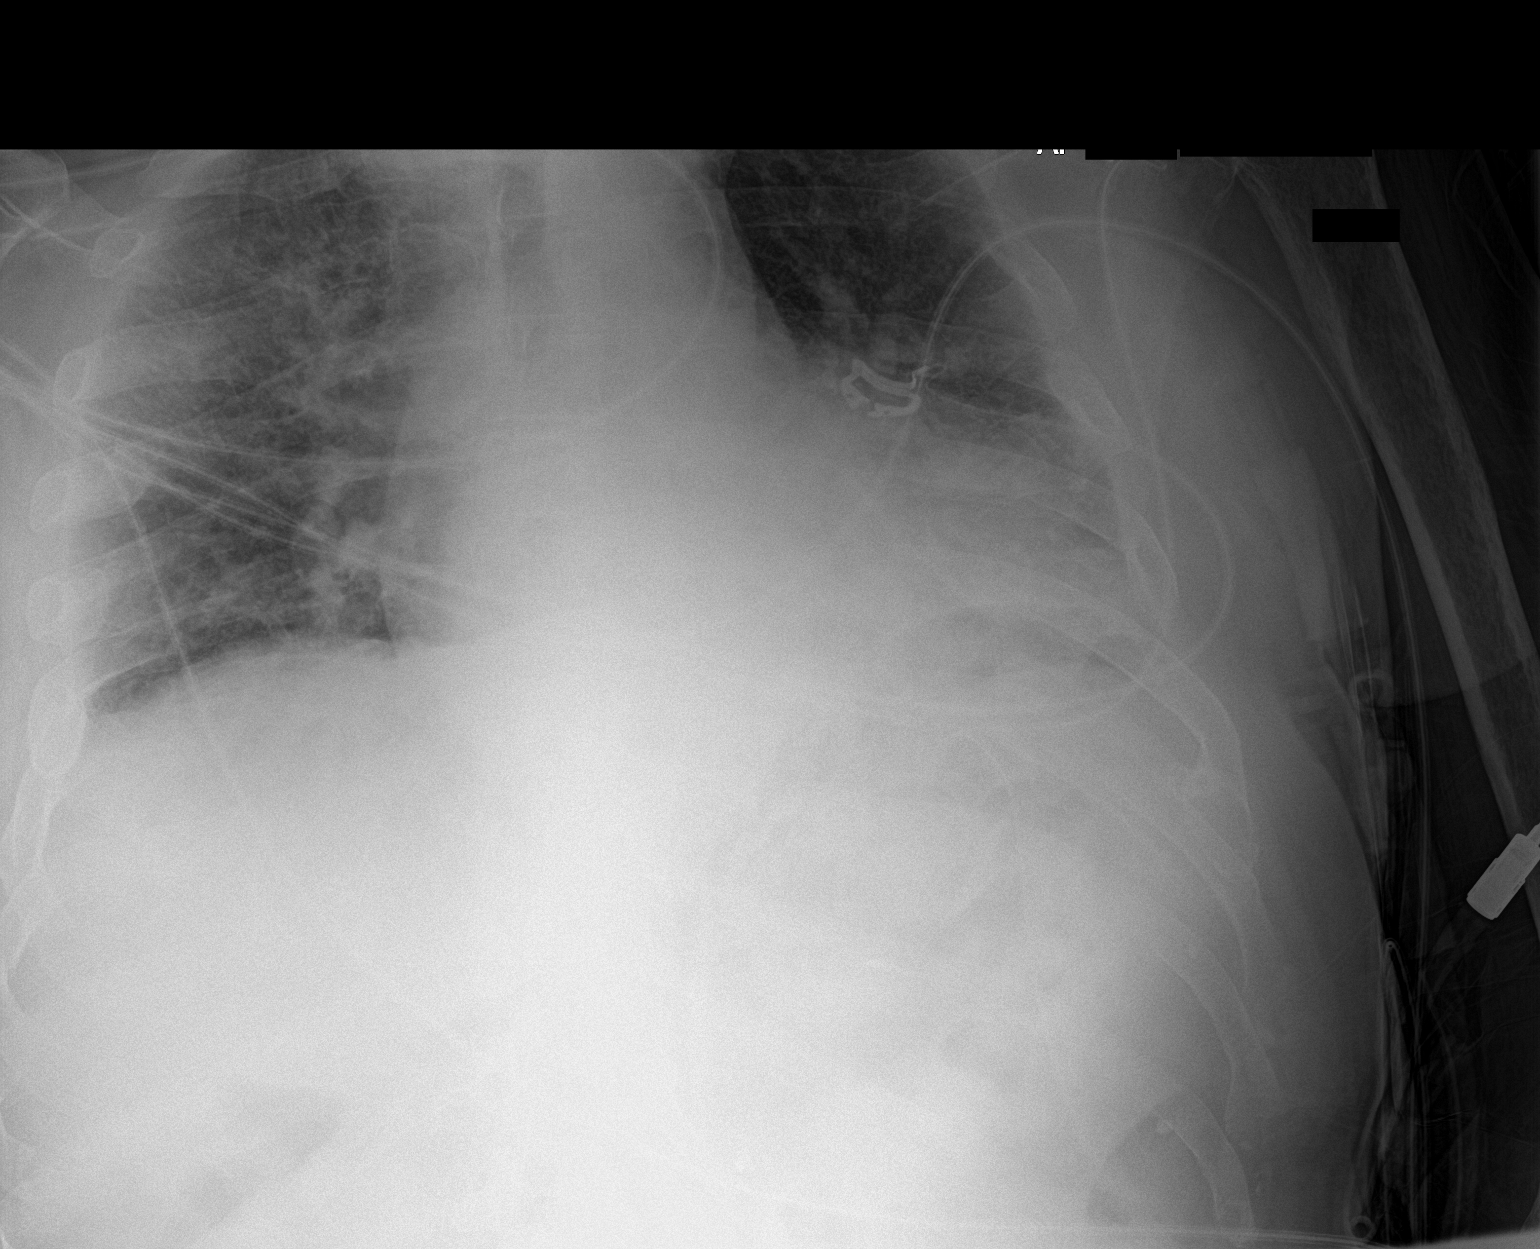

[2 of 2 positions shown; findings below may reference images not displayed]

FINDINGS: Left lower lobe airspace disease unchanged. Mild right upper lobe
airspace disease has progressed. Negative for effusion.

Tracheostomy remains in good position.
IMPRESSION: Left lower lobe airspace disease unchanged.

Progression of mild right upper lobe airspace disease. Possible
pneumonia.

## 2019-05-28 IMAGING — DX DG CHEST 1V PORT
1 series · 1 of 1 positions shown · non-contrast
Comparison: 04/08/2017

CLINICAL DATA: Short of breath

EXAM:
PORTABLE CHEST 1 VIEW

[chest ap]
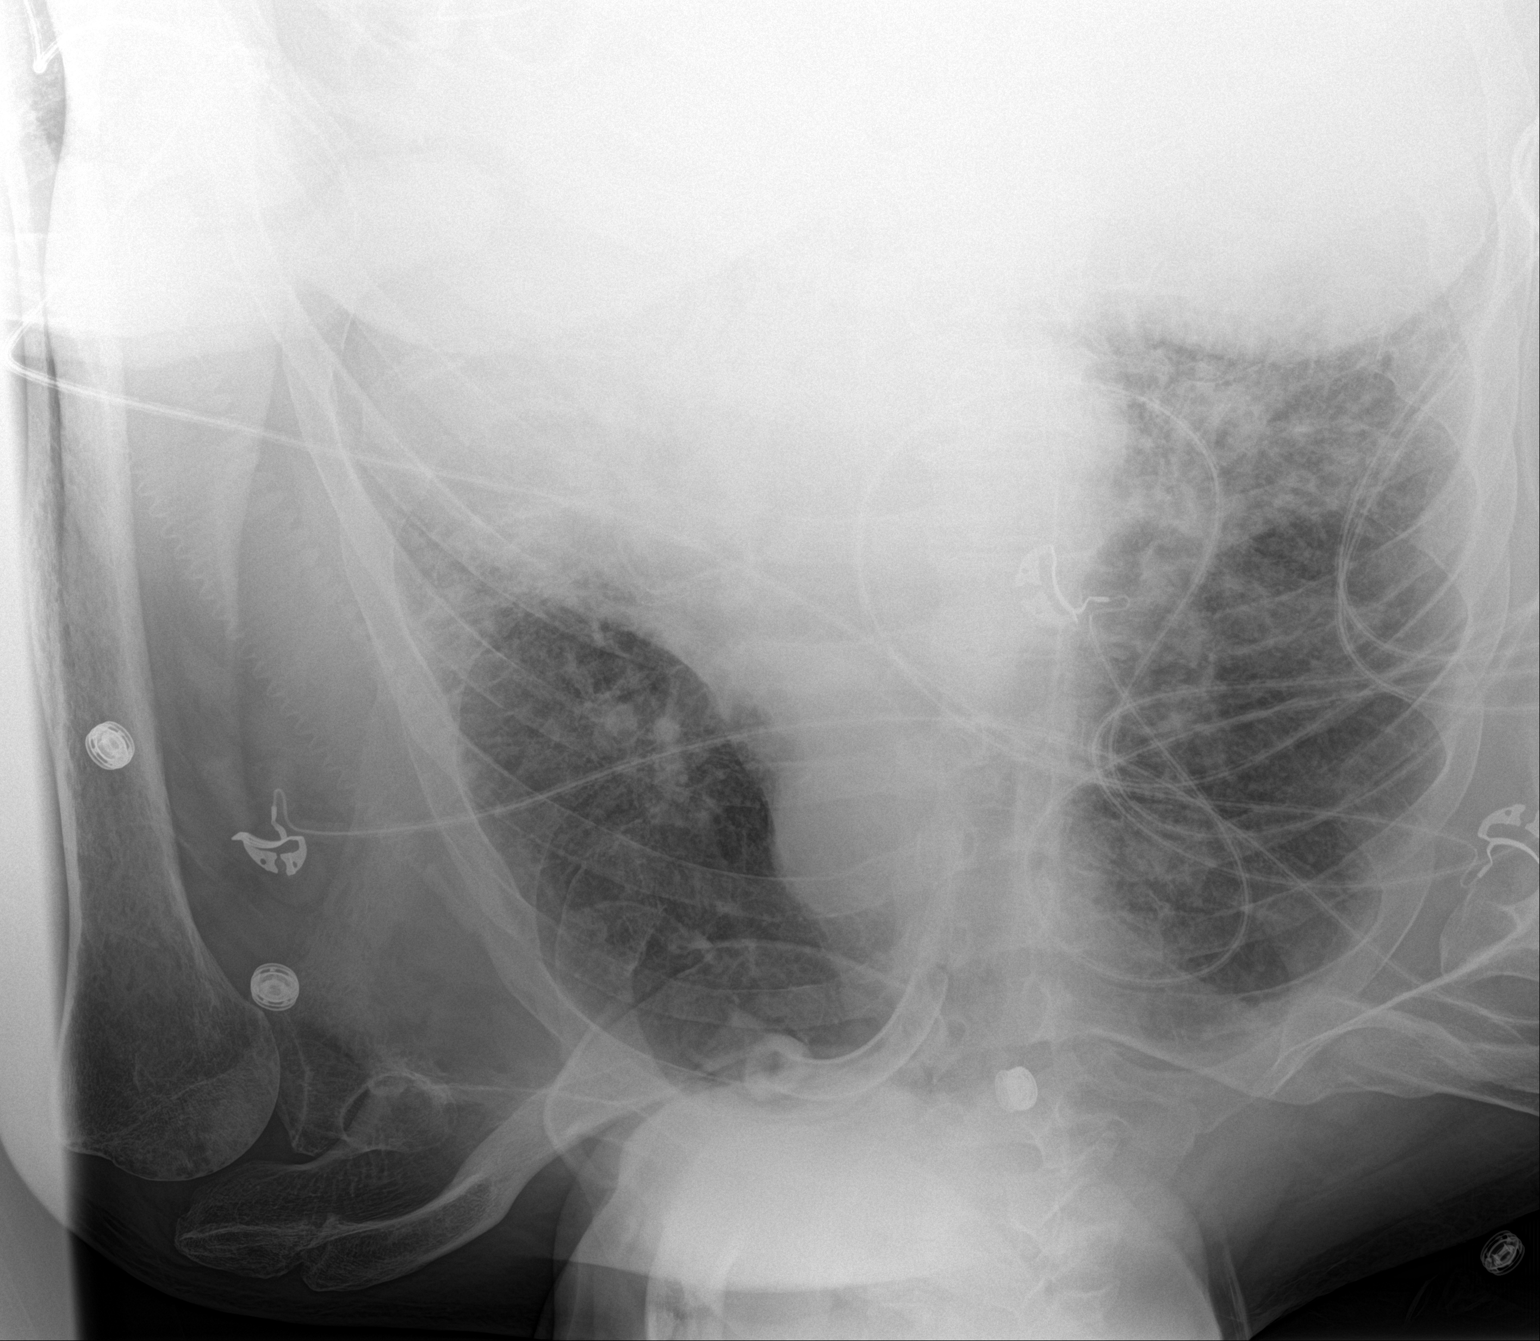

[1 of 1 positions shown; findings below may reference images not displayed]

FINDINGS: Cardiac enlargement. Progression of bilateral airspace disease most
likely due to pulmonary edema. Bibasilar airspace disease with some
progression on the right.
IMPRESSION: Mild progression of bilateral airspace disease.  Probable edema.

Progression of right lower lobe airspace disease. Possible pneumonia

## 2019-05-28 IMAGING — DX DG CHEST 1V PORT
1 series · 1 of 1 positions shown · non-contrast
Comparison: 04/10/2017

CLINICAL DATA: Shortness of breath

EXAM:
PORTABLE CHEST 1 VIEW

[chest ap]
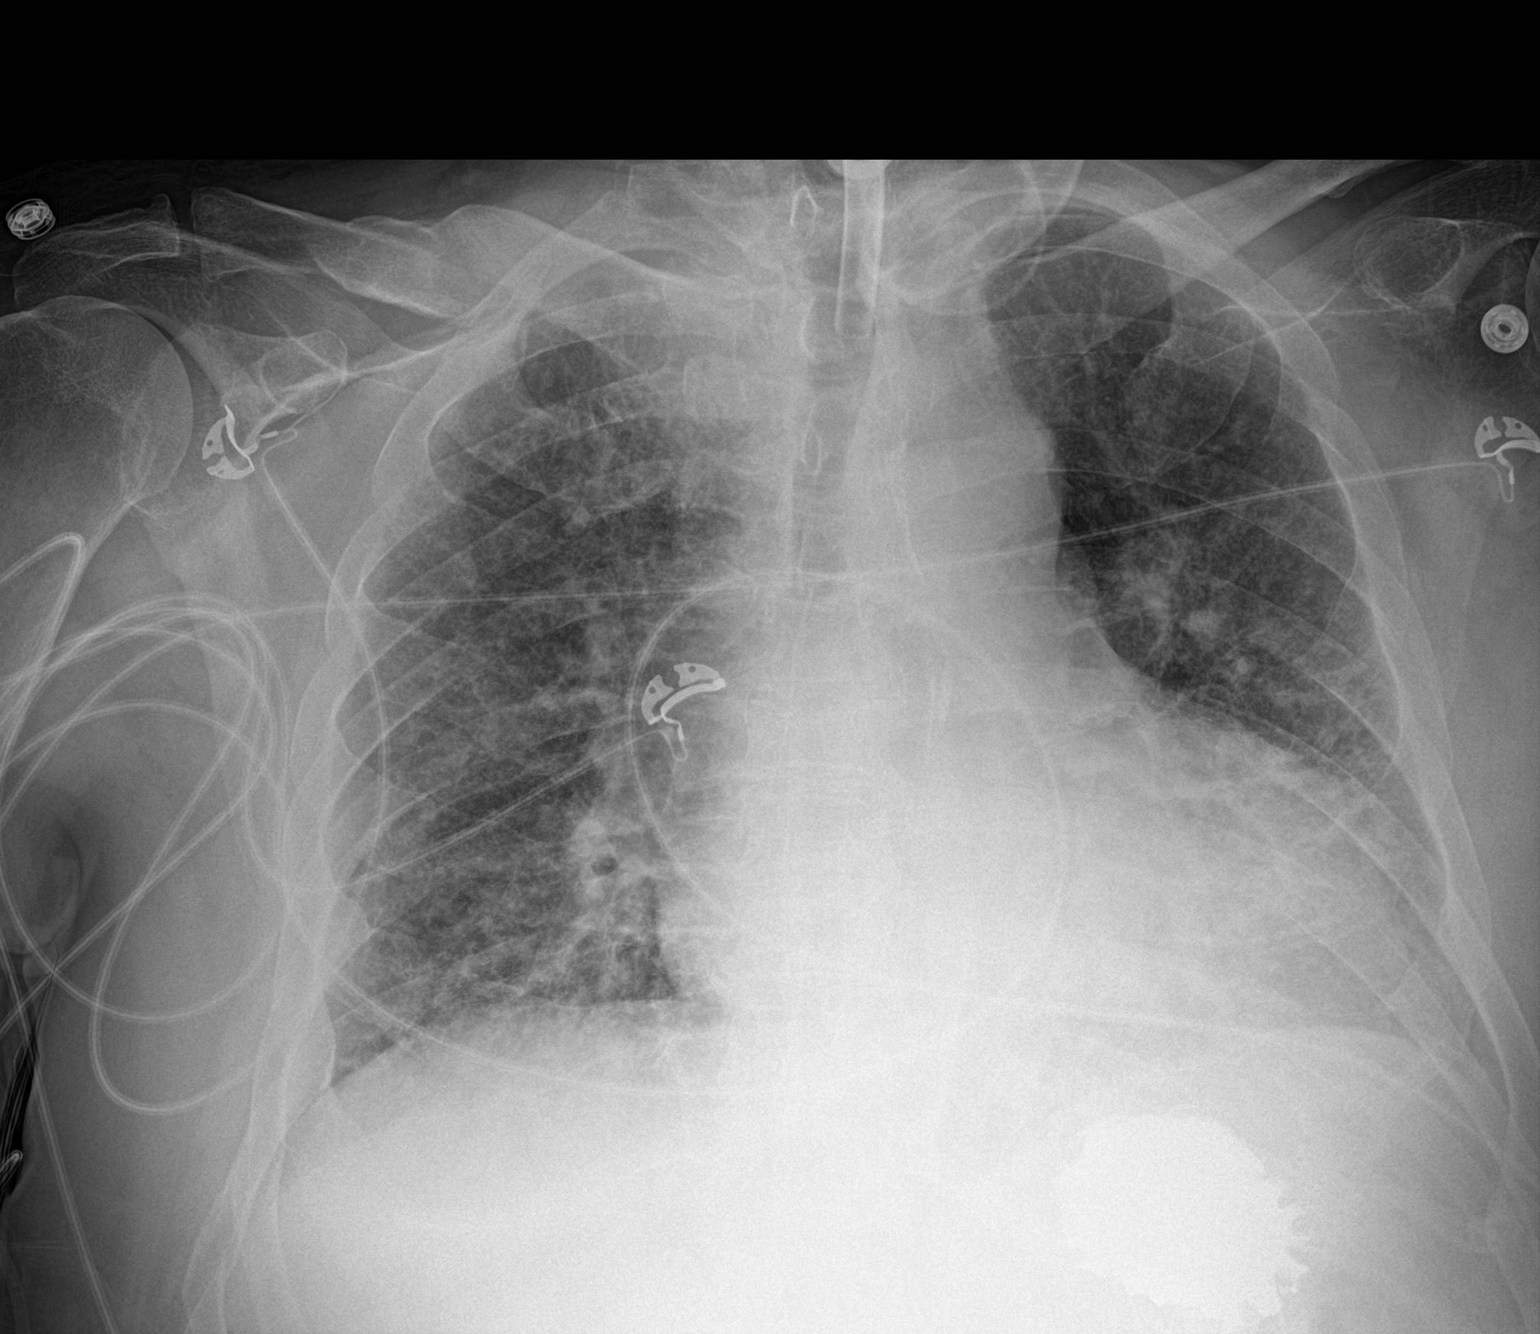

[1 of 1 positions shown; findings below may reference images not displayed]

FINDINGS: Tracheostomy tube in place.

Bilateral interstitial and alveolar airspace opacities. No pleural
effusion or pneumothorax. Stable cardiomegaly. No acute osseous
abnormality.
IMPRESSION: Mild pulmonary edema.

## 2019-05-31 IMAGING — DX DG CHEST 1V PORT
1 series · 1 of 1 positions shown · non-contrast
Comparison: Chest radiograph 04/10/2017

CLINICAL DATA: Cough and self removal of tracheostomy

EXAM:
PORTABLE CHEST 1 VIEW

[chest]
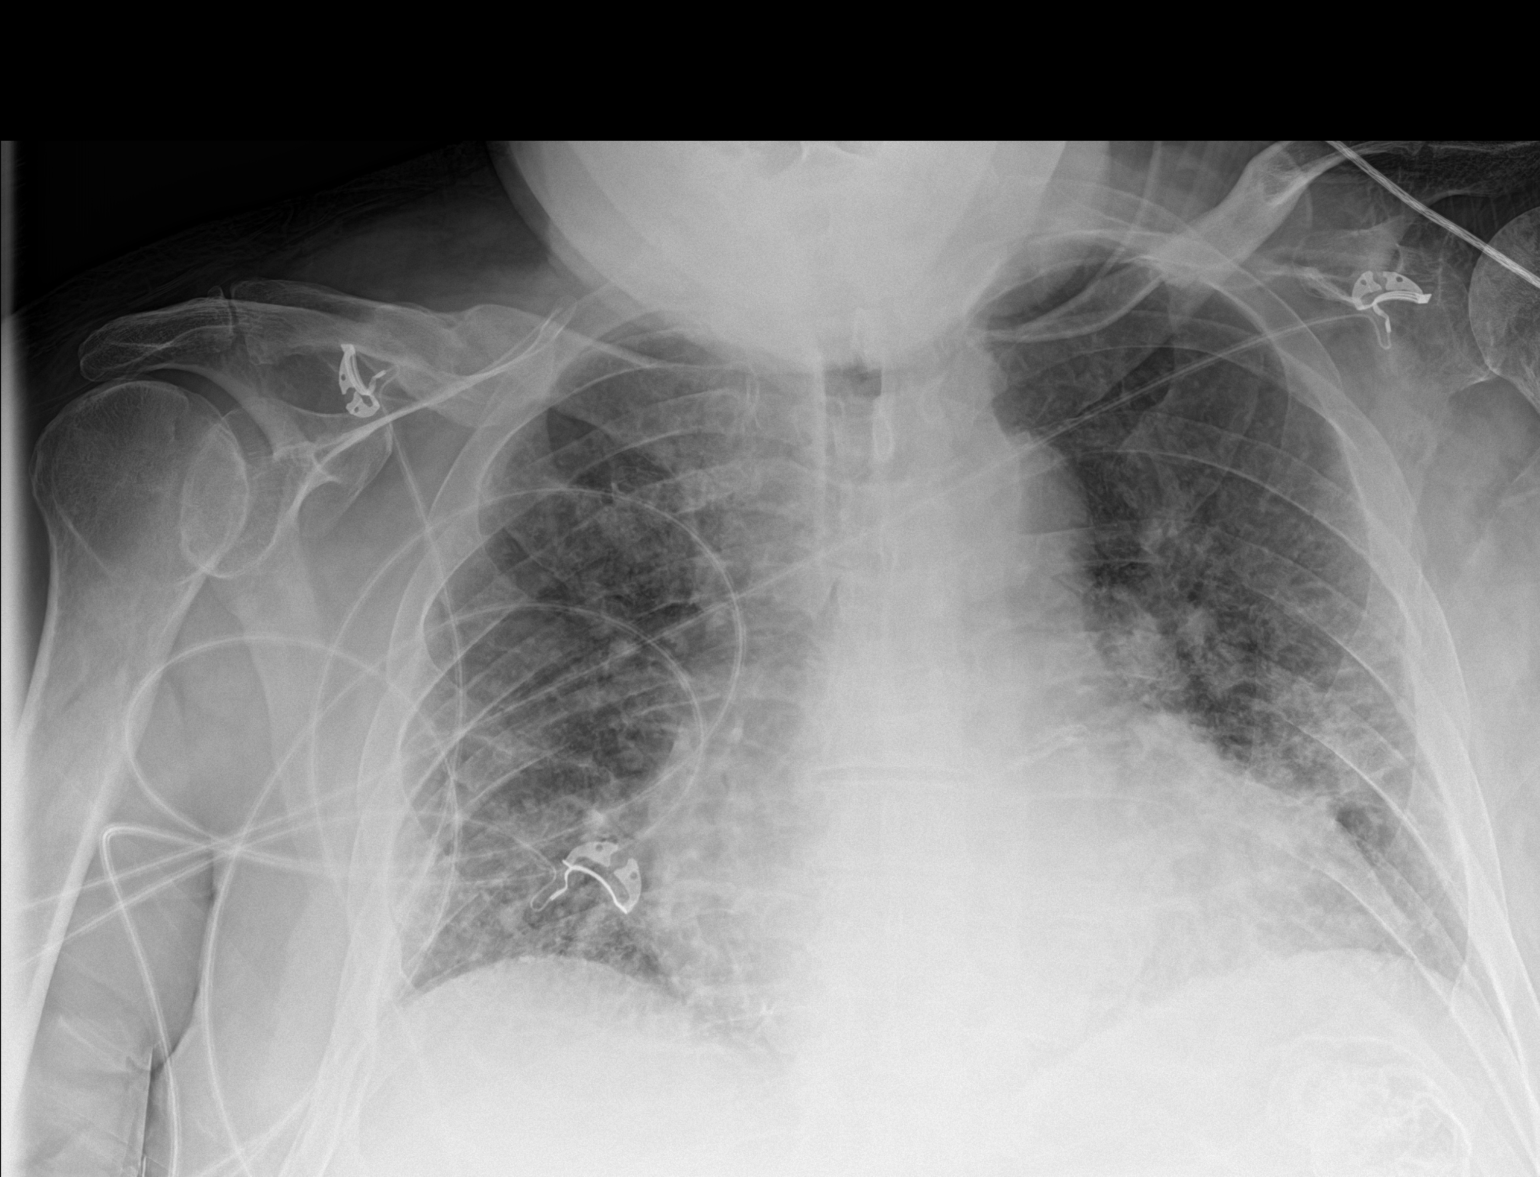

[1 of 1 positions shown; findings below may reference images not displayed]

FINDINGS: Tracheostomy tube is no longer present. Cardiomegaly is unchanged.
There is mild pulmonary edema, unchanged from the 04/10/2017
radiograph. No pneumothorax or pleural effusion.
IMPRESSION: 1. Removal of tracheostomy tube.
2. Cardiomegaly and unchanged mild pulmonary edema.

## 2019-06-23 IMAGING — DX DG CHEST 1V PORT
1 series · 2 of 2 positions shown · non-contrast
Comparison: 04/13/2017

CLINICAL DATA: Hypoxia

EXAM:
PORTABLE CHEST 1 VIEW

[Series 1: chest · 0.14mm/px · 2 of 2 slices shown]
[im 1/2]
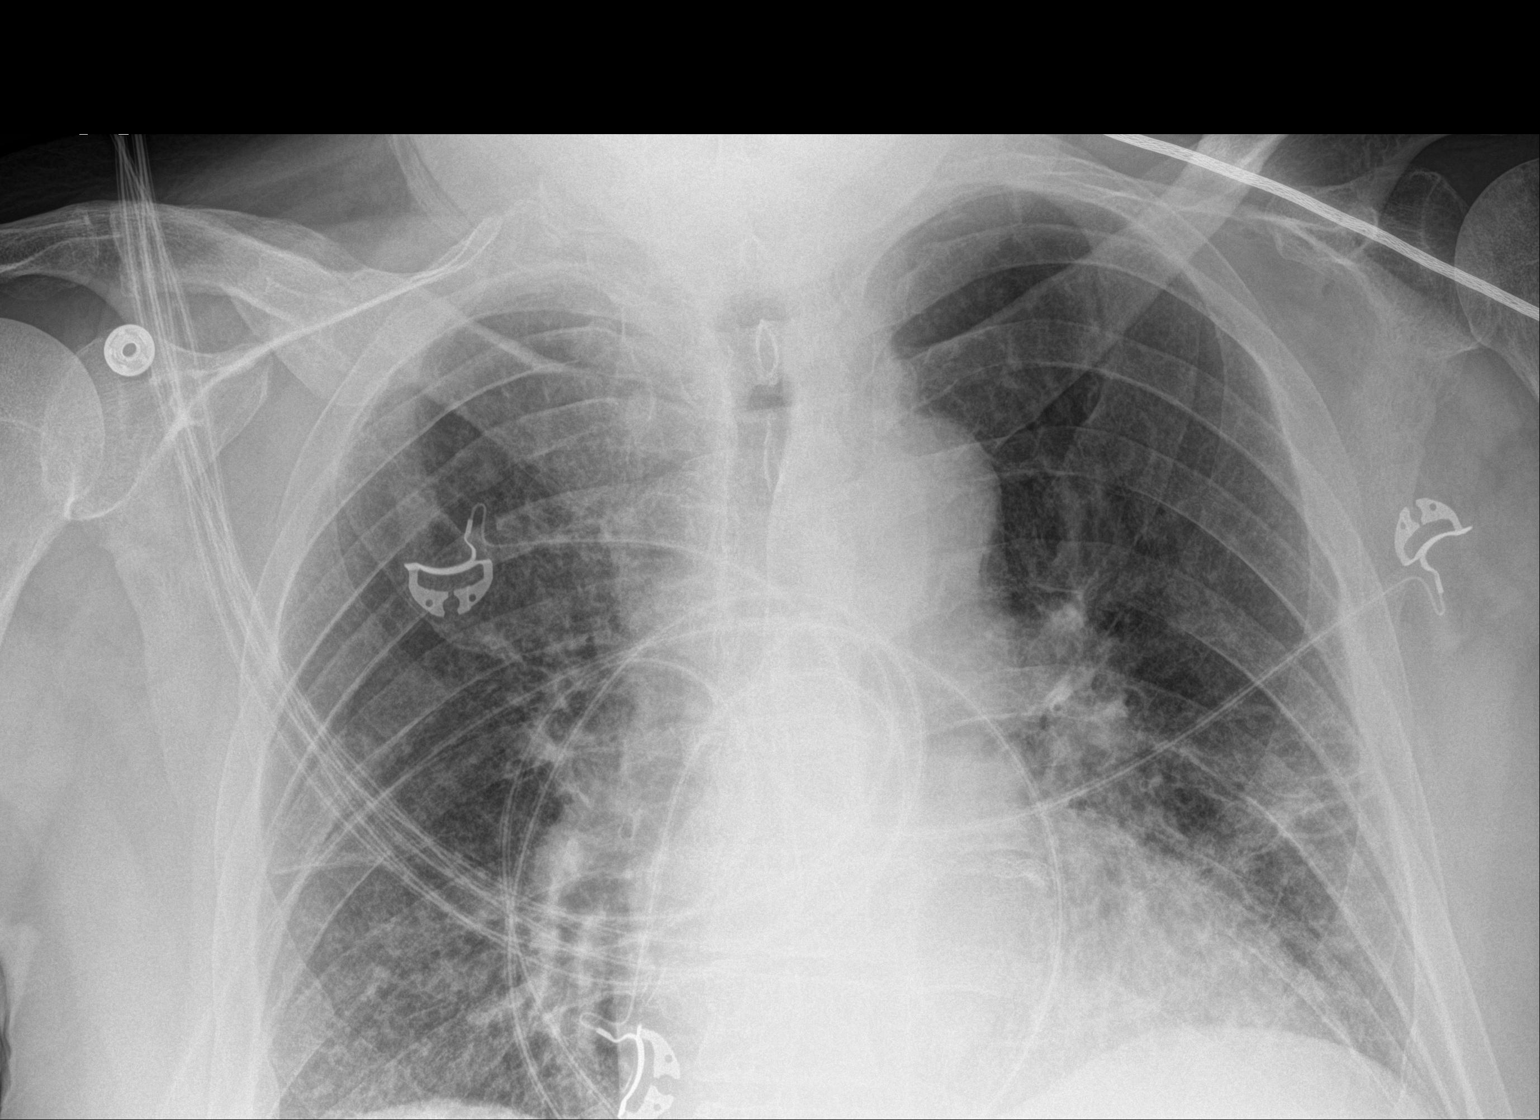
[im 2/2]
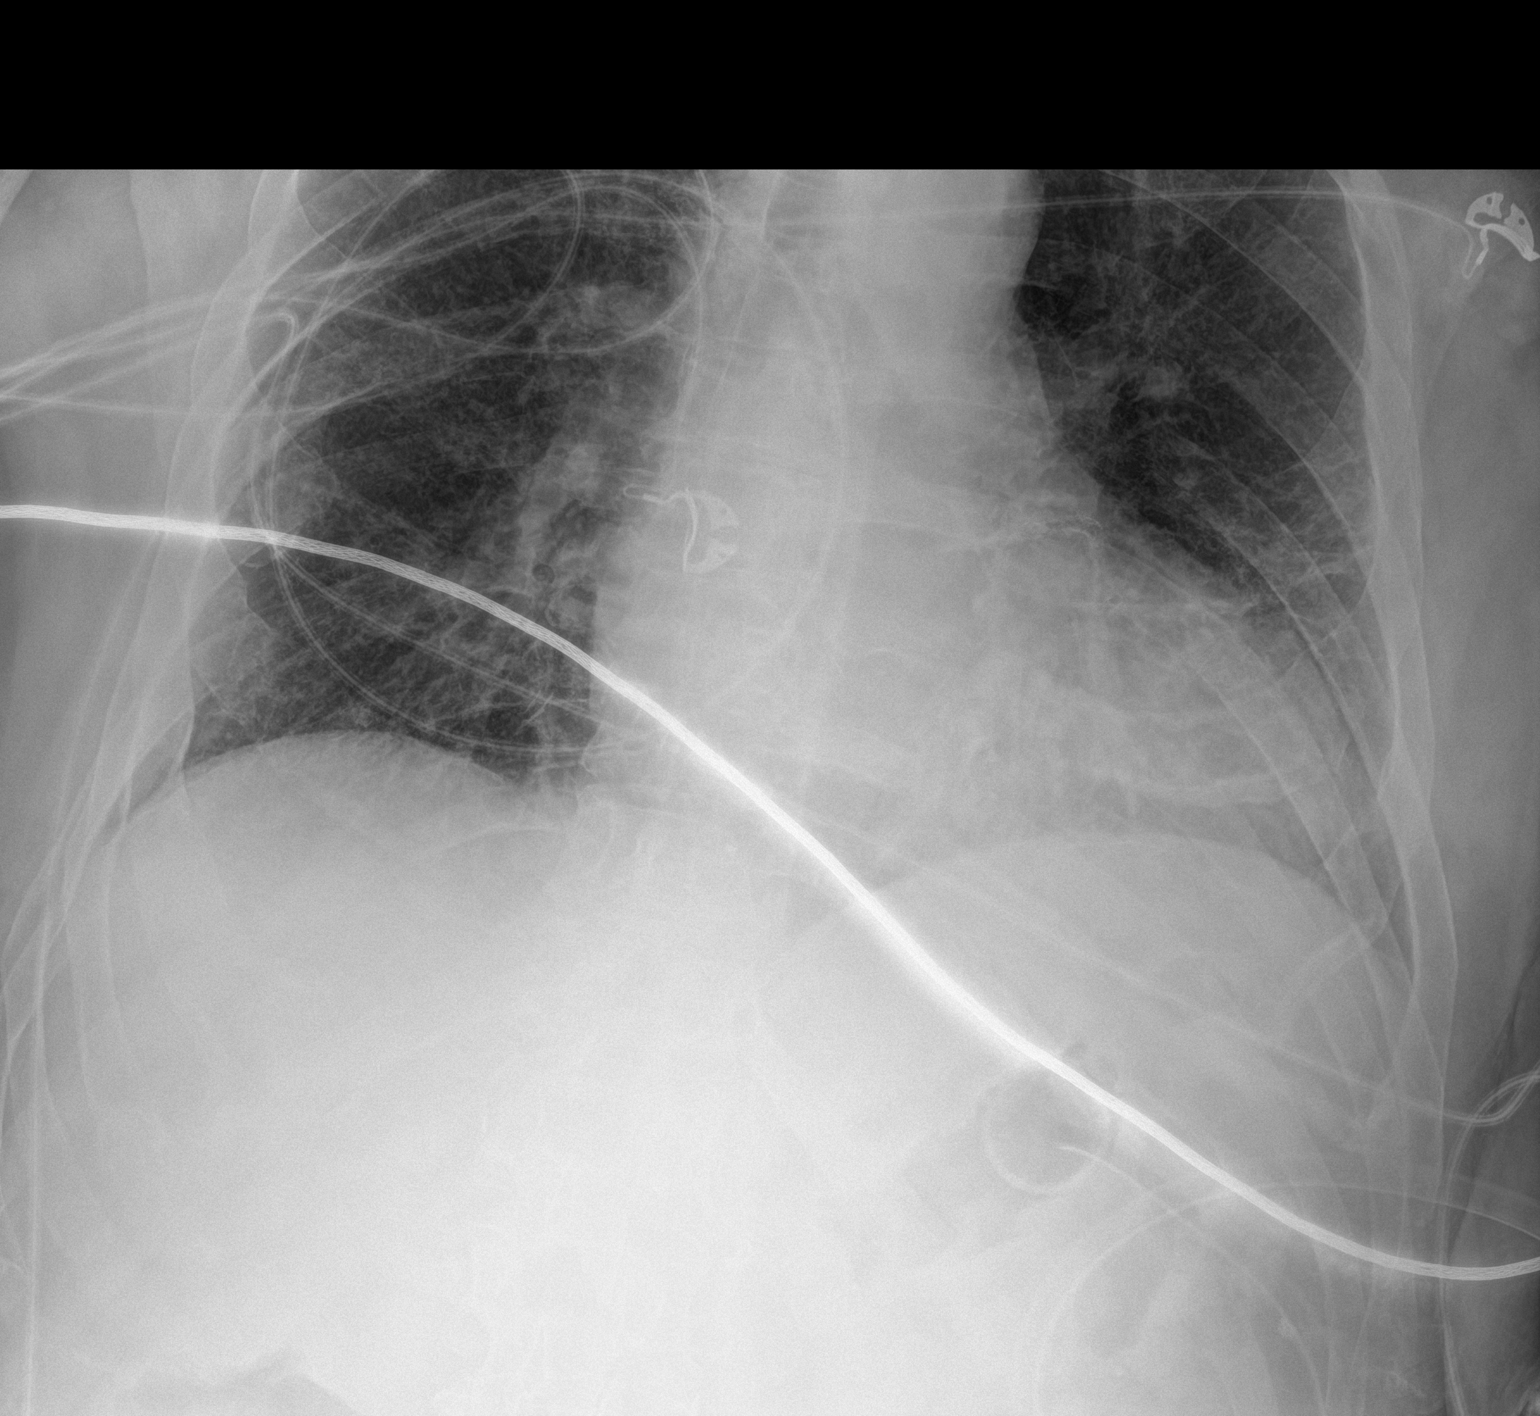

[2 of 2 positions shown; findings below may reference images not displayed]

FINDINGS: Mild cardiomegaly. Patchy opacity at the left lung base, similar to
prior study may reflect scarring. No visible effusions or acute bony
abnormality appear
IMPRESSION: Cardiomegaly. Left basilar opacity is stable since prior study,
likely scarring.

## 2019-07-09 IMAGING — RF DG SWALLOWING FUNCTION
1 series · 17 of 24 positions shown · non-contrast
Comparison: 04/10/2017 swallowing function study

CLINICAL DATA: 61-year-old male with dysphagia, history of
respiratory failure and chronic illness. Previous tracheostomy tube
and PEG tube. Prior episodes of aspiration.

EXAM:
MODIFIED BARIUM SWALLOW
TECHNIQUE: Different consistencies of barium were administered orally to the
patient by the Speech Pathologist. Imaging of the pharynx was
performed in the lateral projection.
FLUOROSCOPY TIME:  Fluoroscopy Time:  3 minutes 10 seconds
Number of Acquired Spot Images: Multiple cine images

[Series 1: run · 26 acquisitions, 17 frames shown]
[im 1/26]
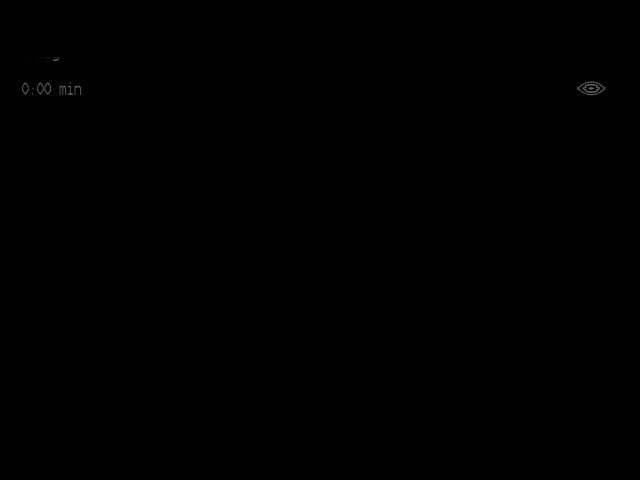
[im 3/26]
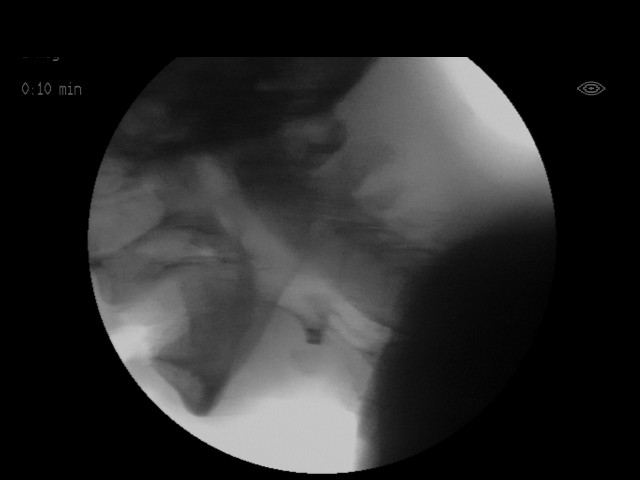
[im 4/26]
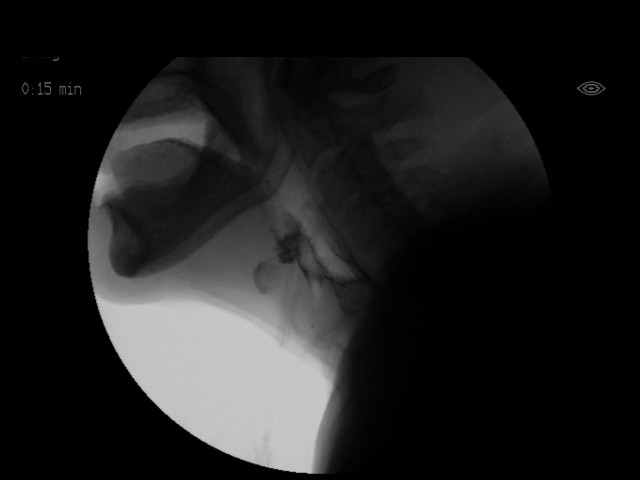
[im 5/26]
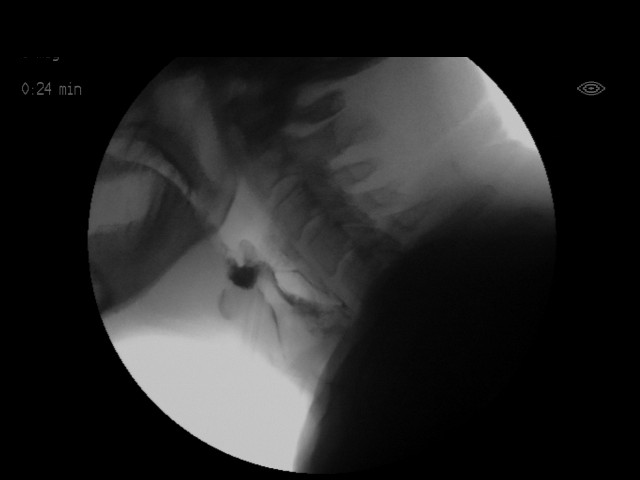
[im 7/26]
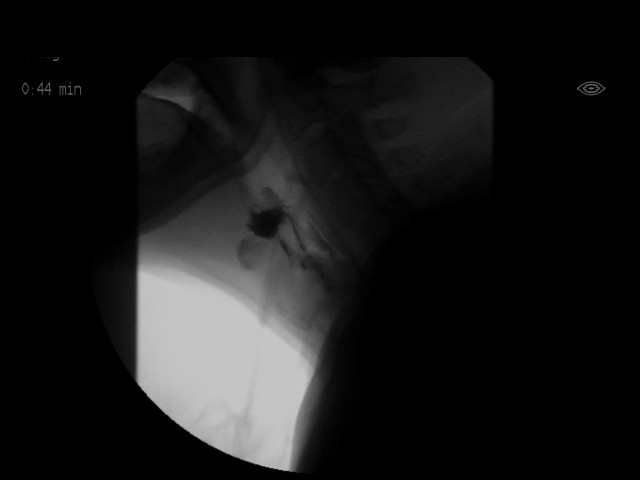
[im 8/26]
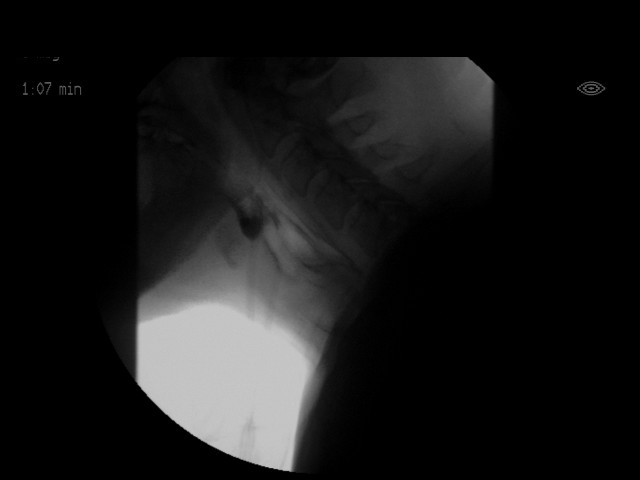
[im 10/26]
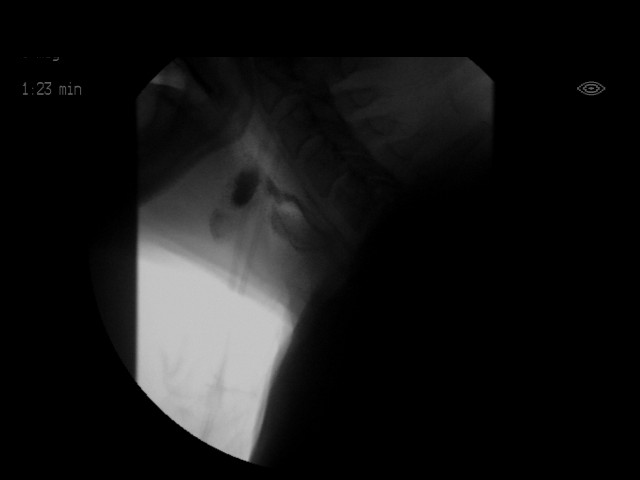
[im 11/26]
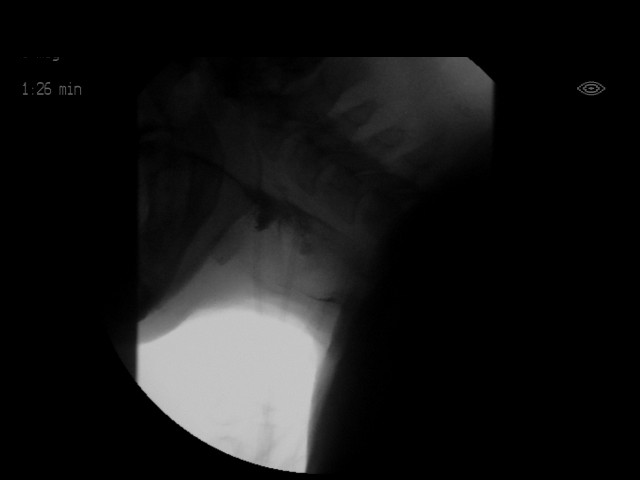
[im 14/26]
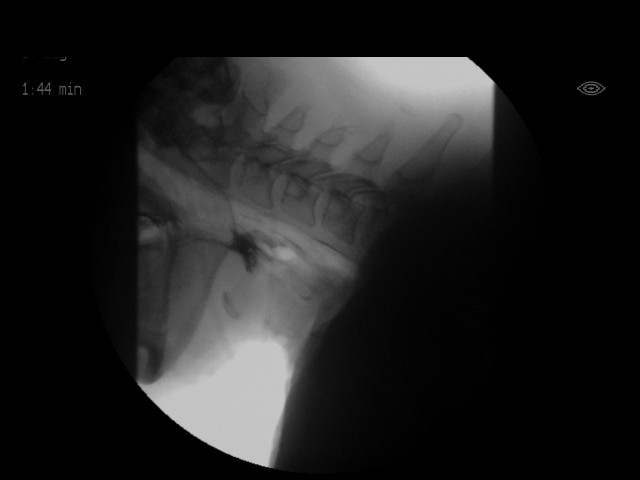
[im 15/26]
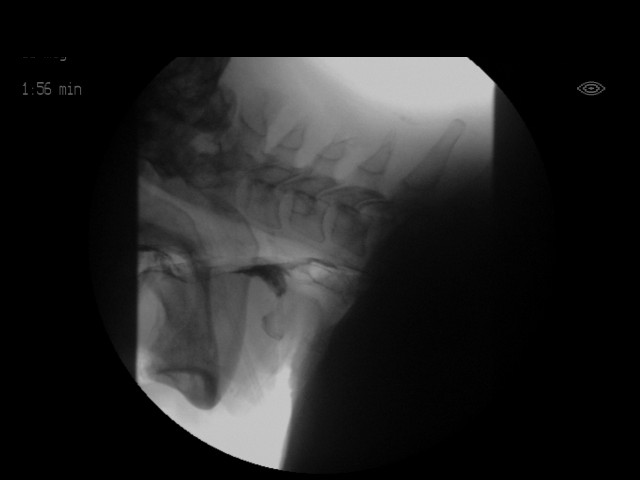
[im 16/26]
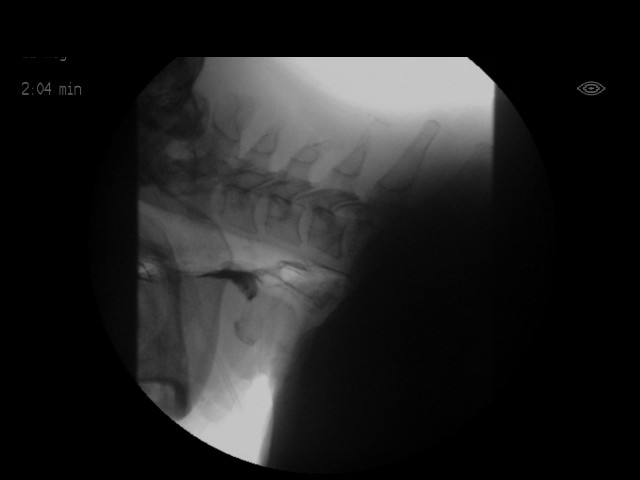
[im 18/26]
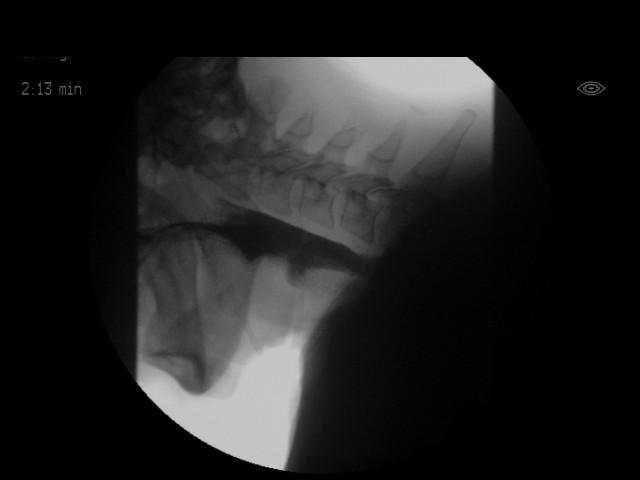
[im 19/26]
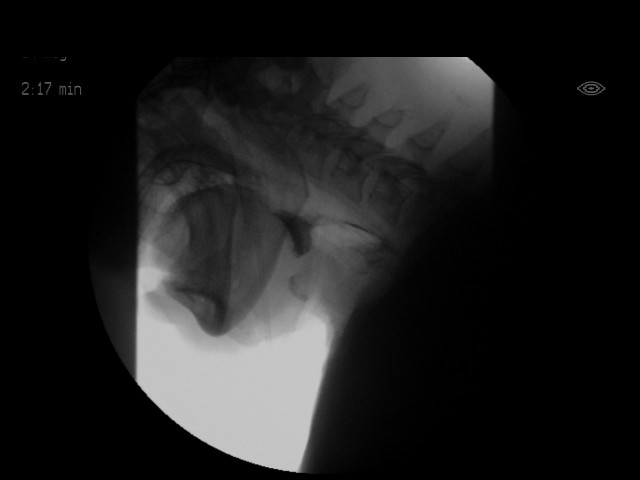
[im 21/26]
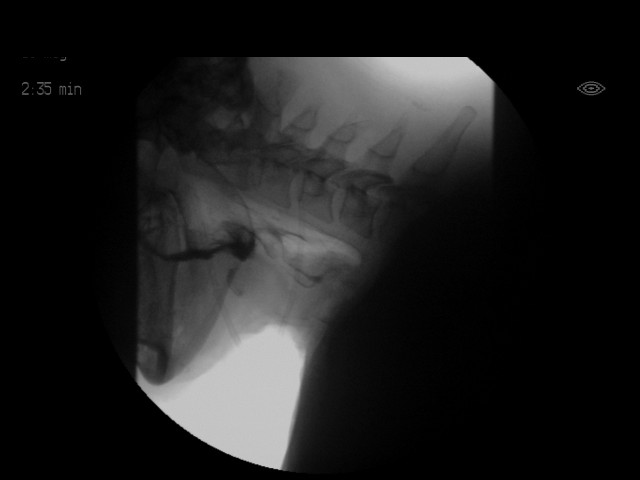
[im 22/26]
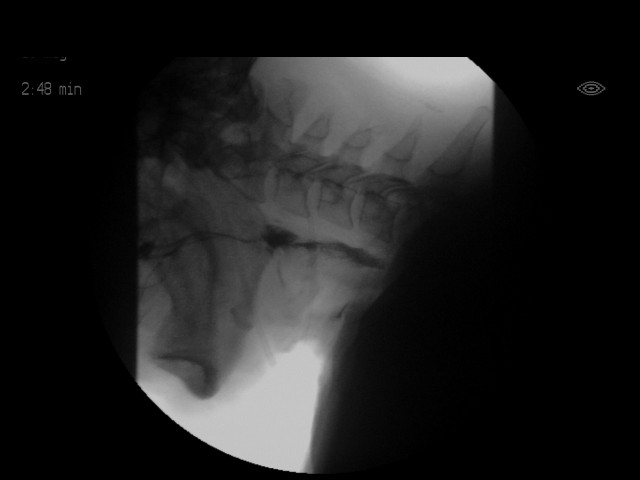
[im 23/26]
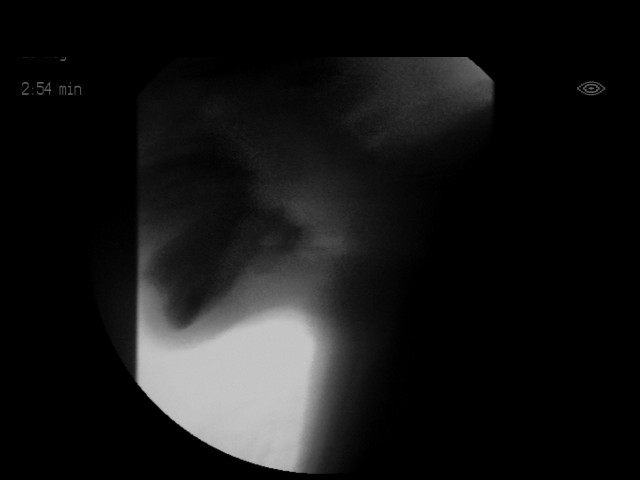
[im 26/26]
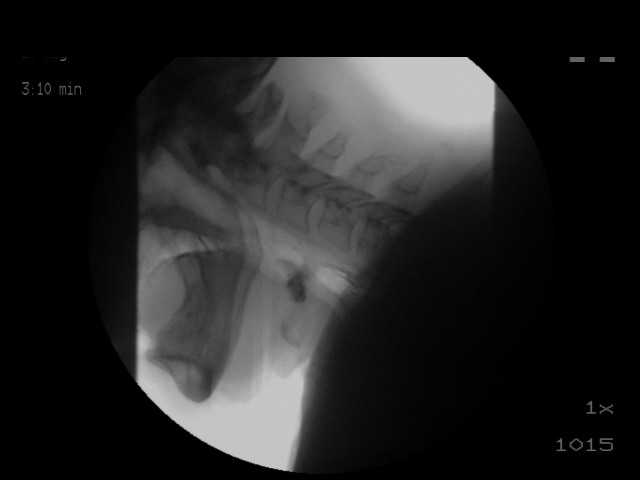

[17 of 24 positions shown; findings below may reference images not displayed]

FINDINGS: Thin liquid- early spill over, delayed swallowing aspiration and
moderate residual noted. On chin tuck, laryngeal penetration noted.

Nectar thick liquid- early spill over, delayed swallowing, laryngeal
penetration and moderate residual noted. On chin tuck, decrease
residual with silent penetration noted.

Honey- delayed swallowing and mild to moderate residual noted. No
aspiration or penetration.

Cookie -delayed swallowing and mild to moderate residual noted. No
aspiration or penetration.
IMPRESSION: Aspiration with thin liquid. Early spill over, delayed swallowing
and residual with all consistencies. Improved findings with chin
tuck.

Please refer to the Speech Pathologists report for complete details
and recommendations.

## 2019-11-07 IMAGING — DX DG ABD PORTABLE 1V
1 series · 1 of 1 positions shown · non-contrast
Comparison: None.

CLINICAL DATA: Orogastric tube placement.

EXAM:
PORTABLE ABDOMEN - 1 VIEW

[abdomen kub]
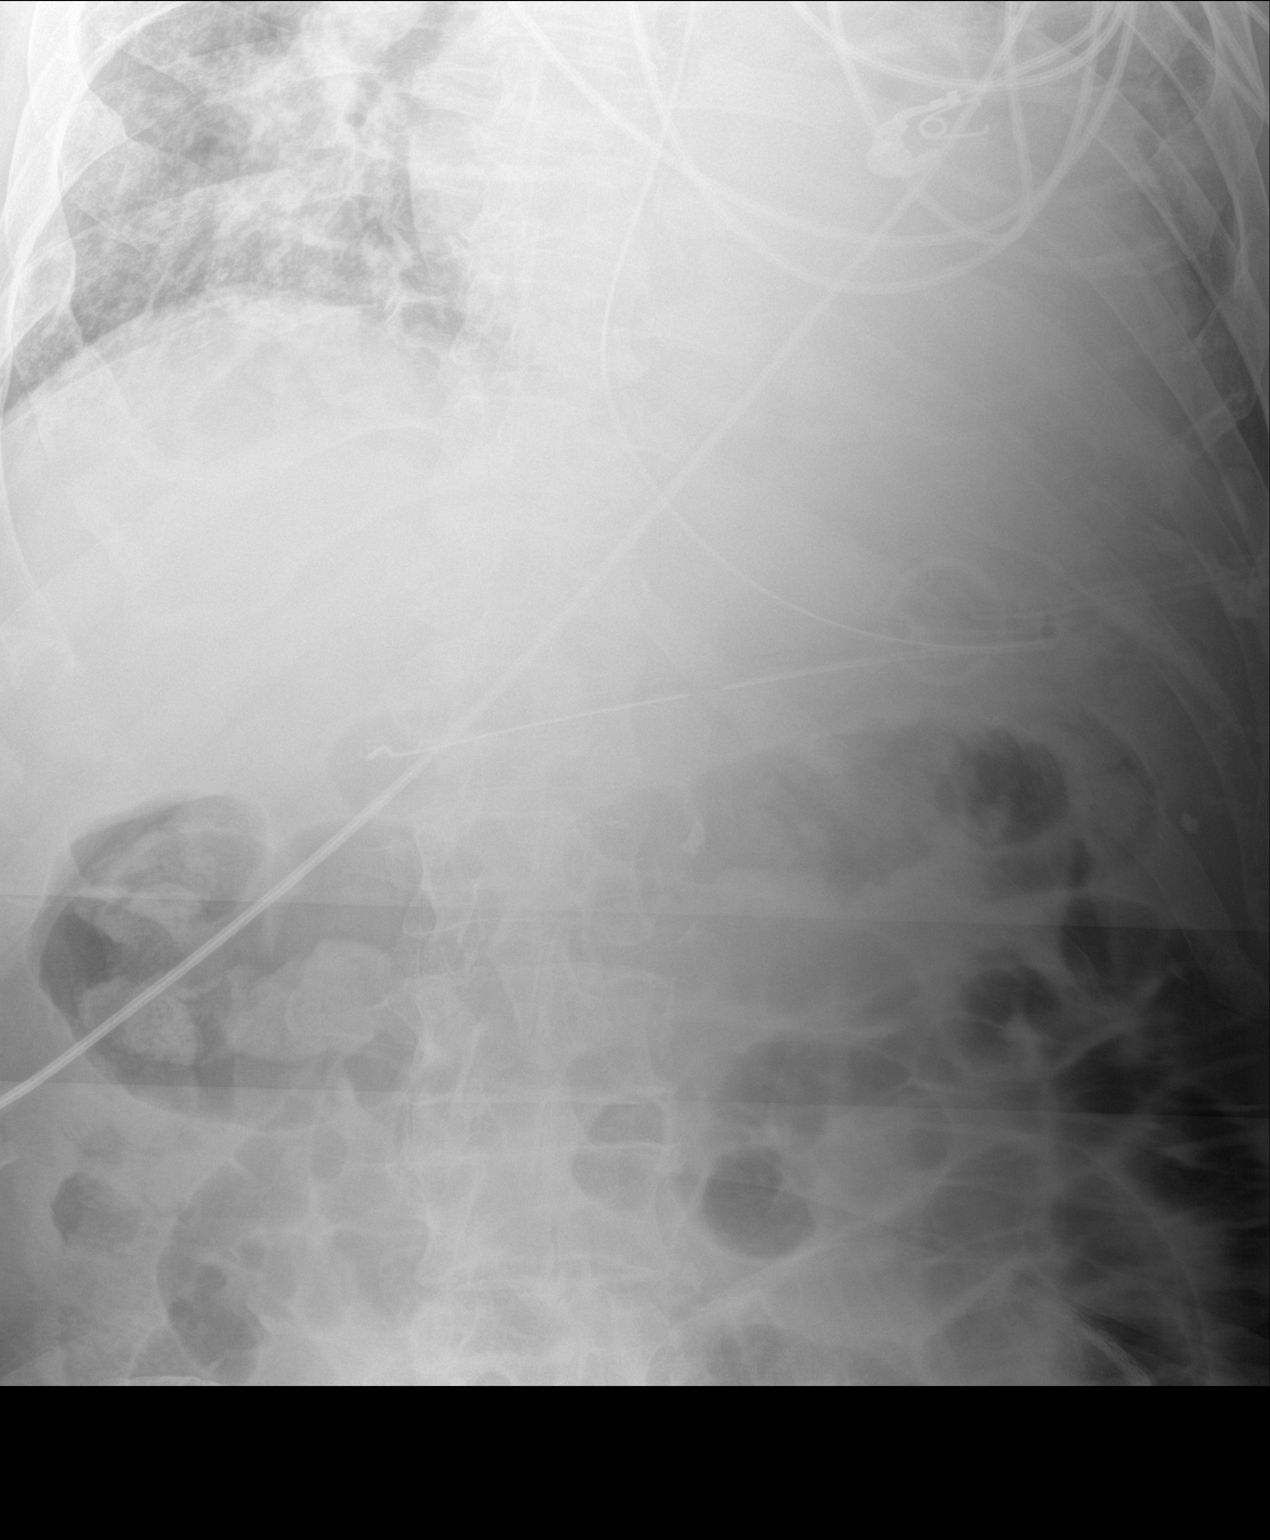

[1 of 1 positions shown; findings below may reference images not displayed]

FINDINGS: Enteric tube in place with tip and side-port below the diaphragm.
Gastrostomy tube in the left upper quadrant. Increased air within
small bowel in the central abdomen. No evidence of free air.
IMPRESSION: Tip and side port of the enteric tube below the diaphragm in the
stomach.

## 2019-11-07 IMAGING — DX DG CHEST 1V PORT
1 series · 1 of 1 positions shown · non-contrast
Comparison: New earlier this day at 4208 hour

CLINICAL DATA: Intubation.

EXAM:
PORTABLE CHEST 1 VIEW

[chest ap]
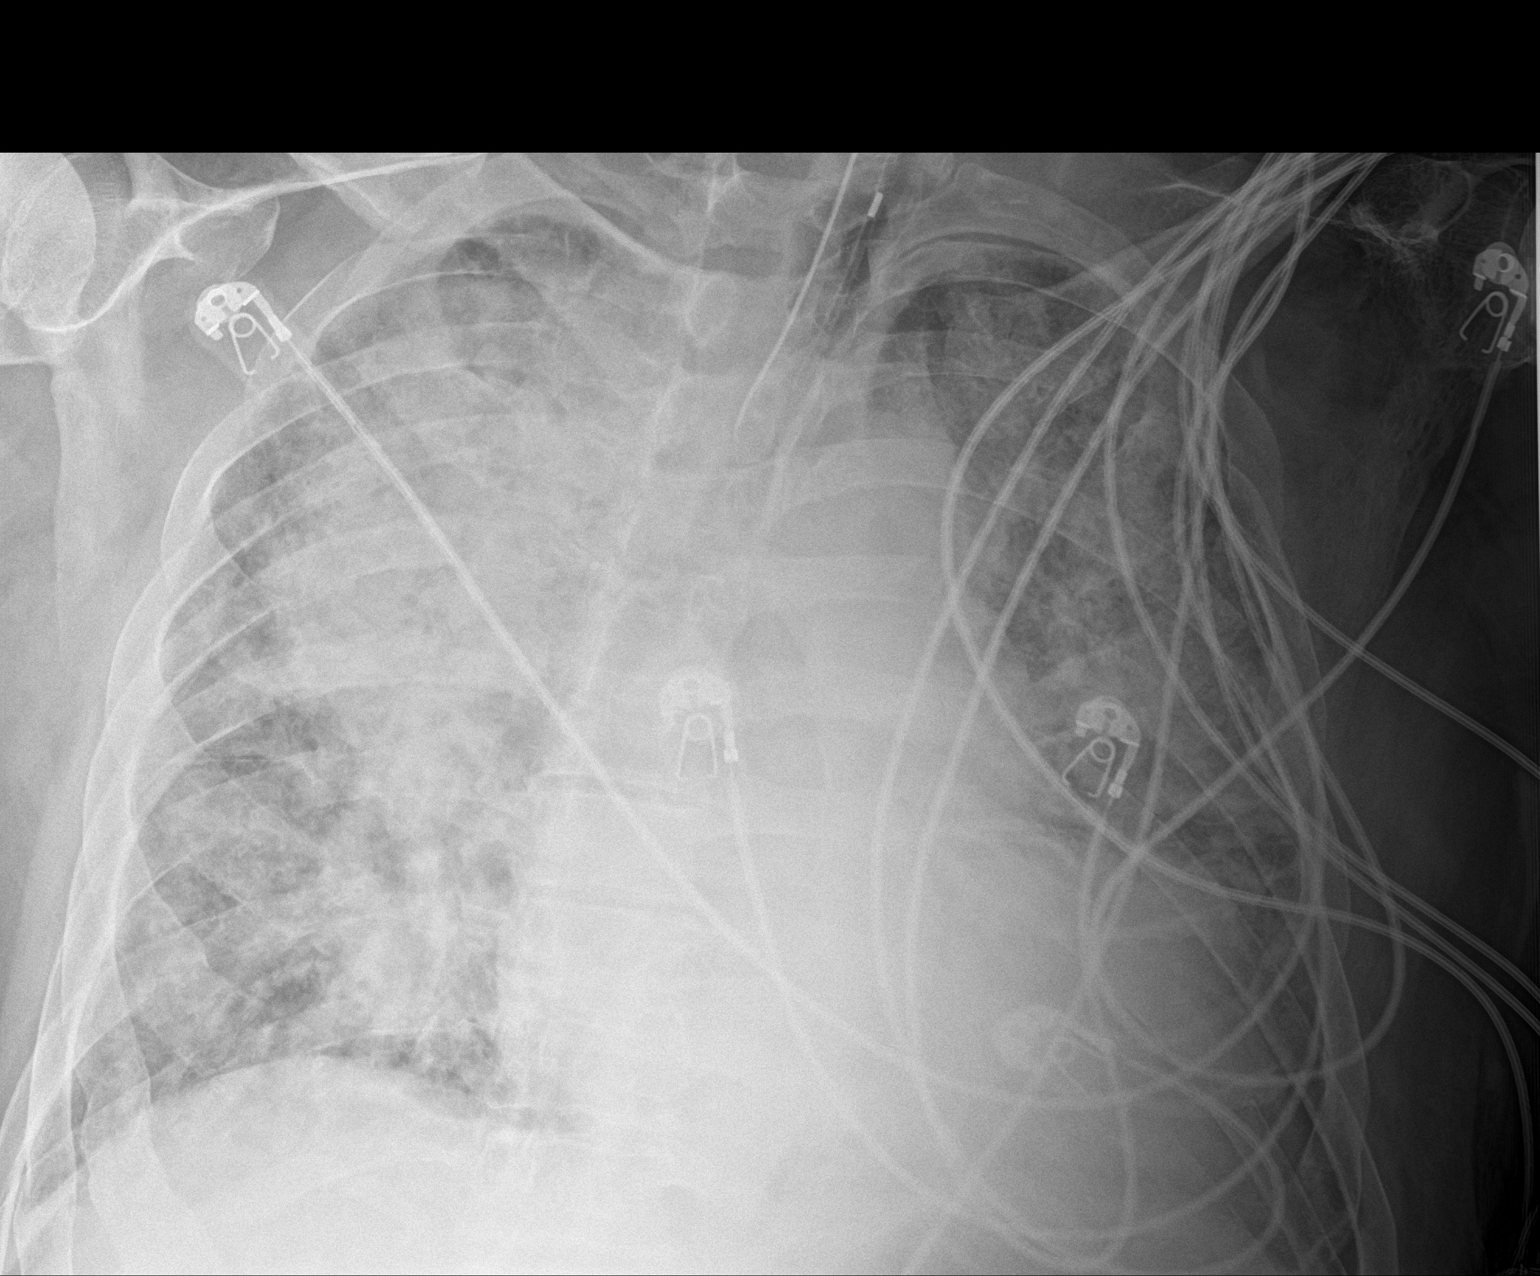

[1 of 1 positions shown; findings below may reference images not displayed]

FINDINGS: Endotracheal tube 4.3 cm from the carina. Patient is rotated to the
right. Cardiomegaly is similar. Progression of bilateral perihilar
opacities since prior exam. No pneumothorax or definite pleural
fluid.
IMPRESSION: 1. Endotracheal tube 4.3 cm from the carina.
2. Progression of diffuse bilateral lung opacities over the past 2
hours, favoring pulmonary edema.

## 2019-11-07 IMAGING — CT CT HEAD W/O CM
3 series · 15 of 47 positions shown, 18 images · non-contrast
Comparison: 12/16/2016

CLINICAL DATA: Altered level of consciousness

EXAM:
CT HEAD WITHOUT CONTRAST
TECHNIQUE: Contiguous axial images were obtained from the base of the skull
through the vertex without intravenous contrast.

[Series 3: head 5.0 h30s · axial · 0.52mm/px · z∈[-120,+25]mm · 9 of 35 slices shown, 12 images]
[im 3/35  brain]
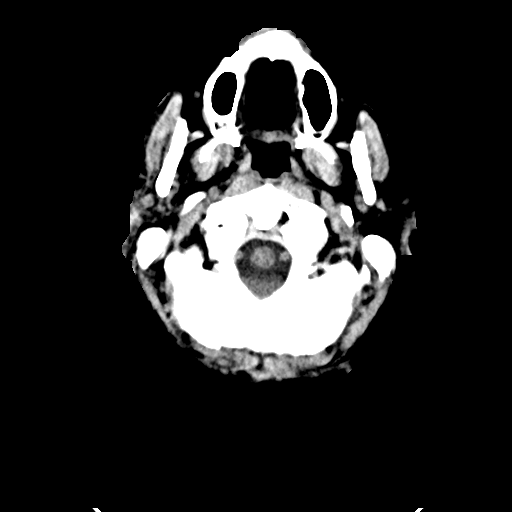
[im 3/35  bone]
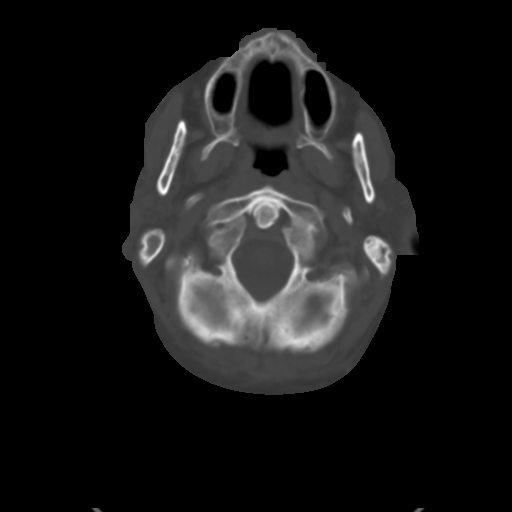
[im 6/35  brain]
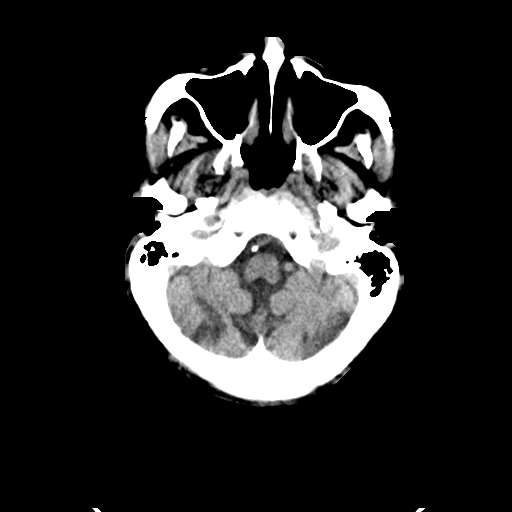
[im 10/35  brain]
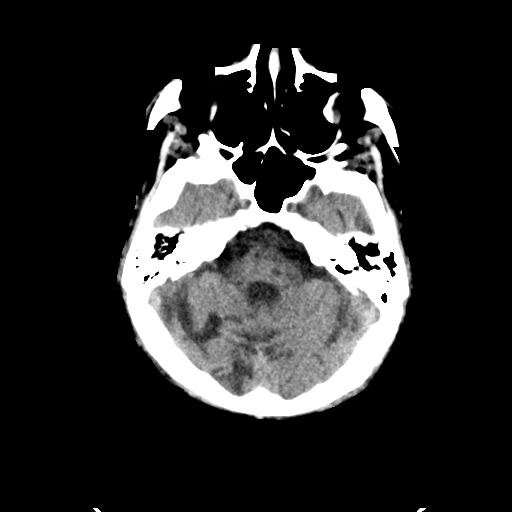
[im 13/35  brain]
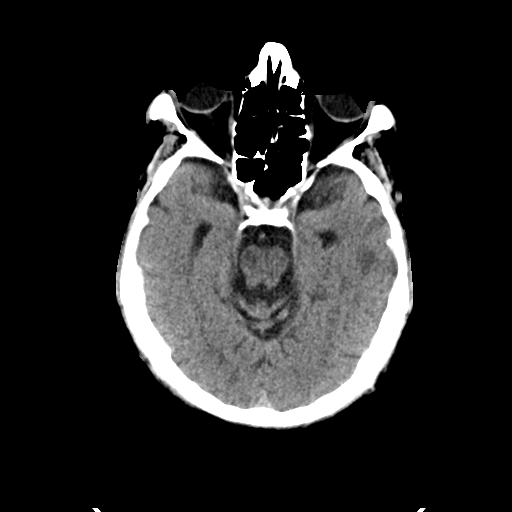
[im 18/35  brain]
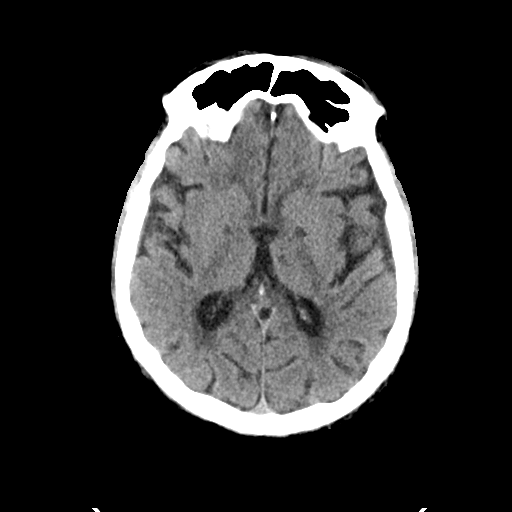
[im 18/35  bone]
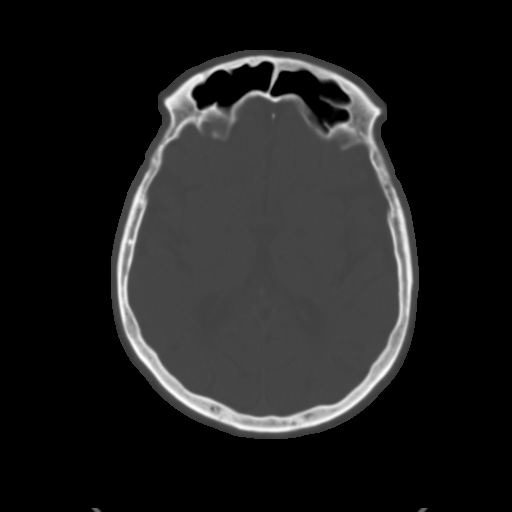
[im 22/35  brain]
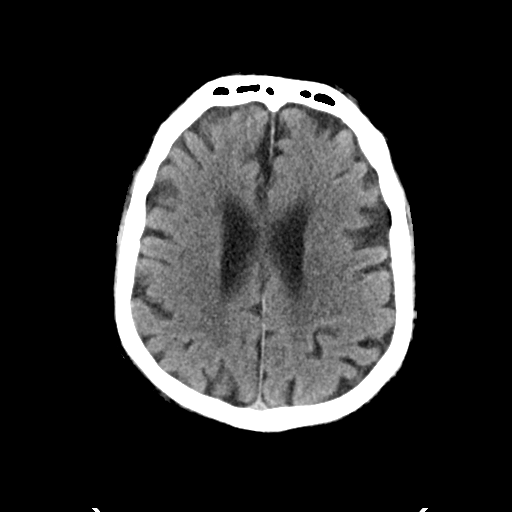
[im 25/35  brain]
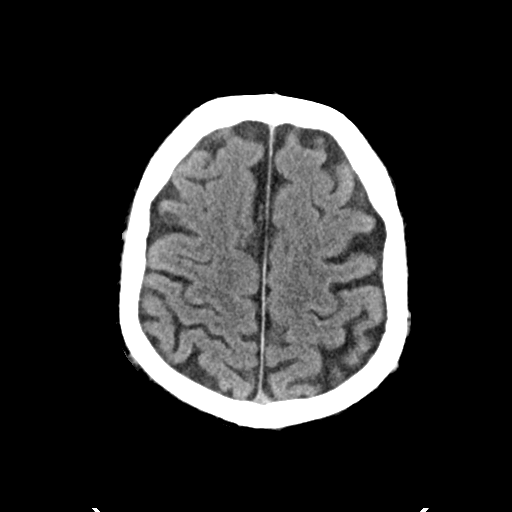
[im 29/35  brain]
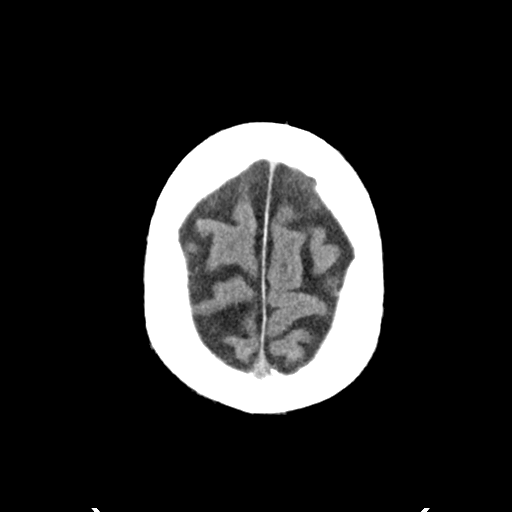
[im 32/35  brain]
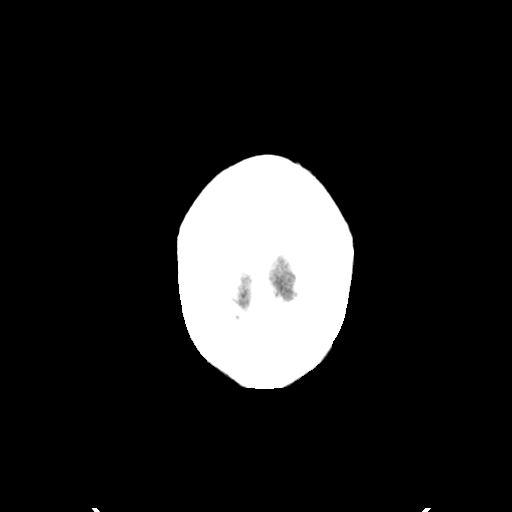
[im 32/35  bone]
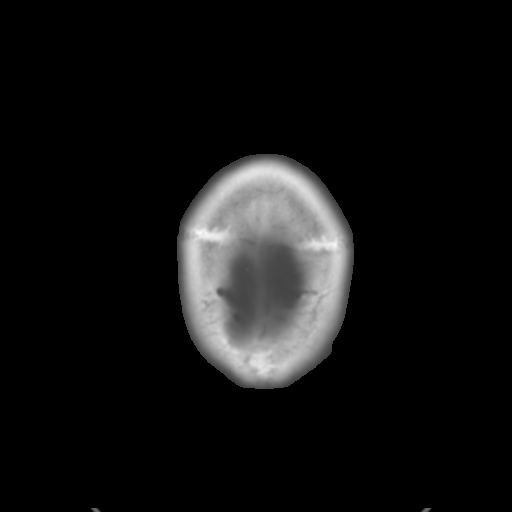

[Series 5: head 3.0 mpr cor · coronal · 0.34mm/px · 3 of 70 slices shown]
[im 24/70  brain]
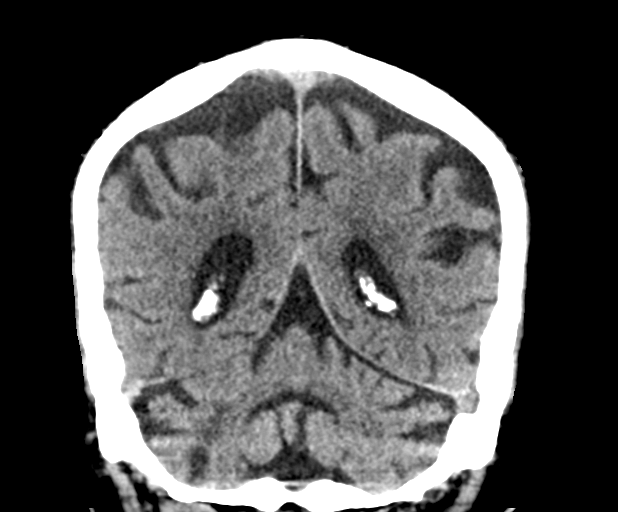
[im 31/70  brain]
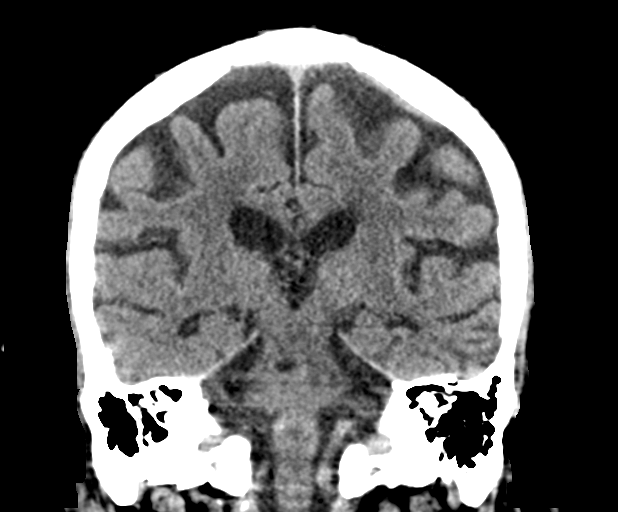
[im 39/70  brain]
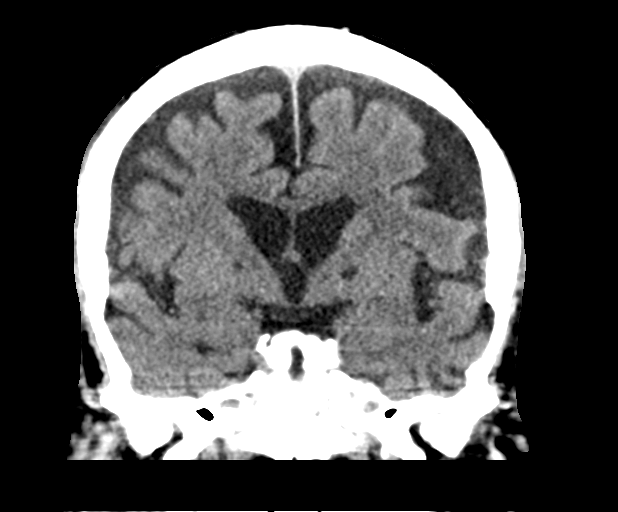

[Series 6: head 3.0 mpr sag · sagittal · 0.37mm/px · 3 of 64 slices shown]
[im 22/64  brain]
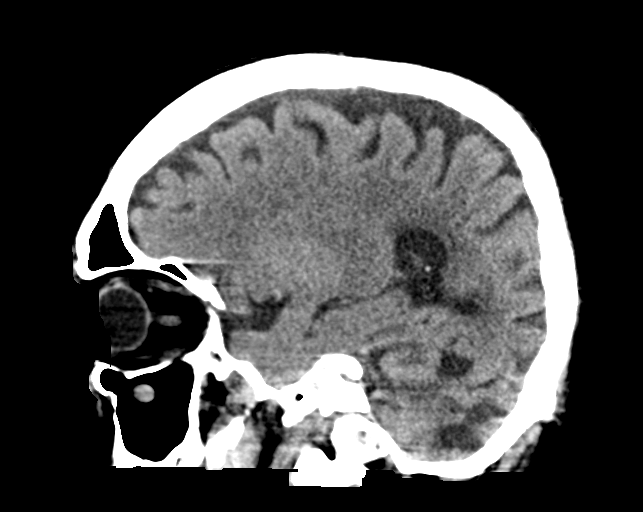
[im 32/64  brain]
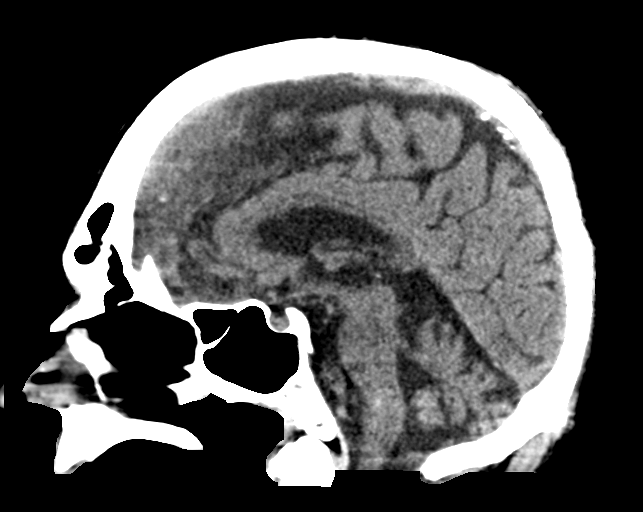
[im 43/64  brain]
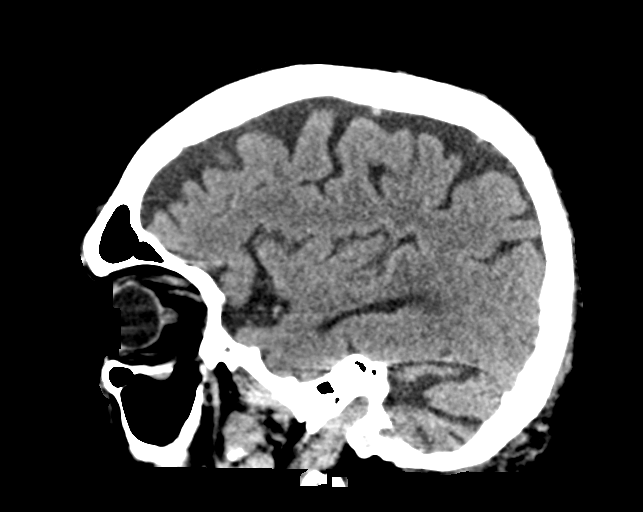

[15 of 47 positions shown; findings below may reference images not displayed]

FINDINGS: Brain: Bilateral basal ganglia lacunar infarcts. Old infarct in the
right brainstem. There is atrophy and chronic small vessel disease
changes. No acute intracranial abnormality. Specifically, no
hemorrhage, hydrocephalus, mass lesion, acute infarction, or
significant intracranial injury.

Vascular: No hyperdense vessel or unexpected calcification.

Skull: No acute calvarial abnormality.

Sinuses/Orbits: No acute finding.

Other: None
IMPRESSION: Old bilateral basal ganglia lacunar infarcts and infarct in the
right brainstem. No acute intracranial abnormality.

Atrophy, chronic small vessel disease.

## 2019-11-07 IMAGING — DX DG CHEST 1V PORT
1 series · 1 of 1 positions shown · non-contrast
Comparison: Most recent comparison 05/06/2017

CLINICAL DATA: Respiratory distress. Chest discomfort and shortness
of breath.

EXAM:
PORTABLE CHEST 1 VIEW

[chest ap]
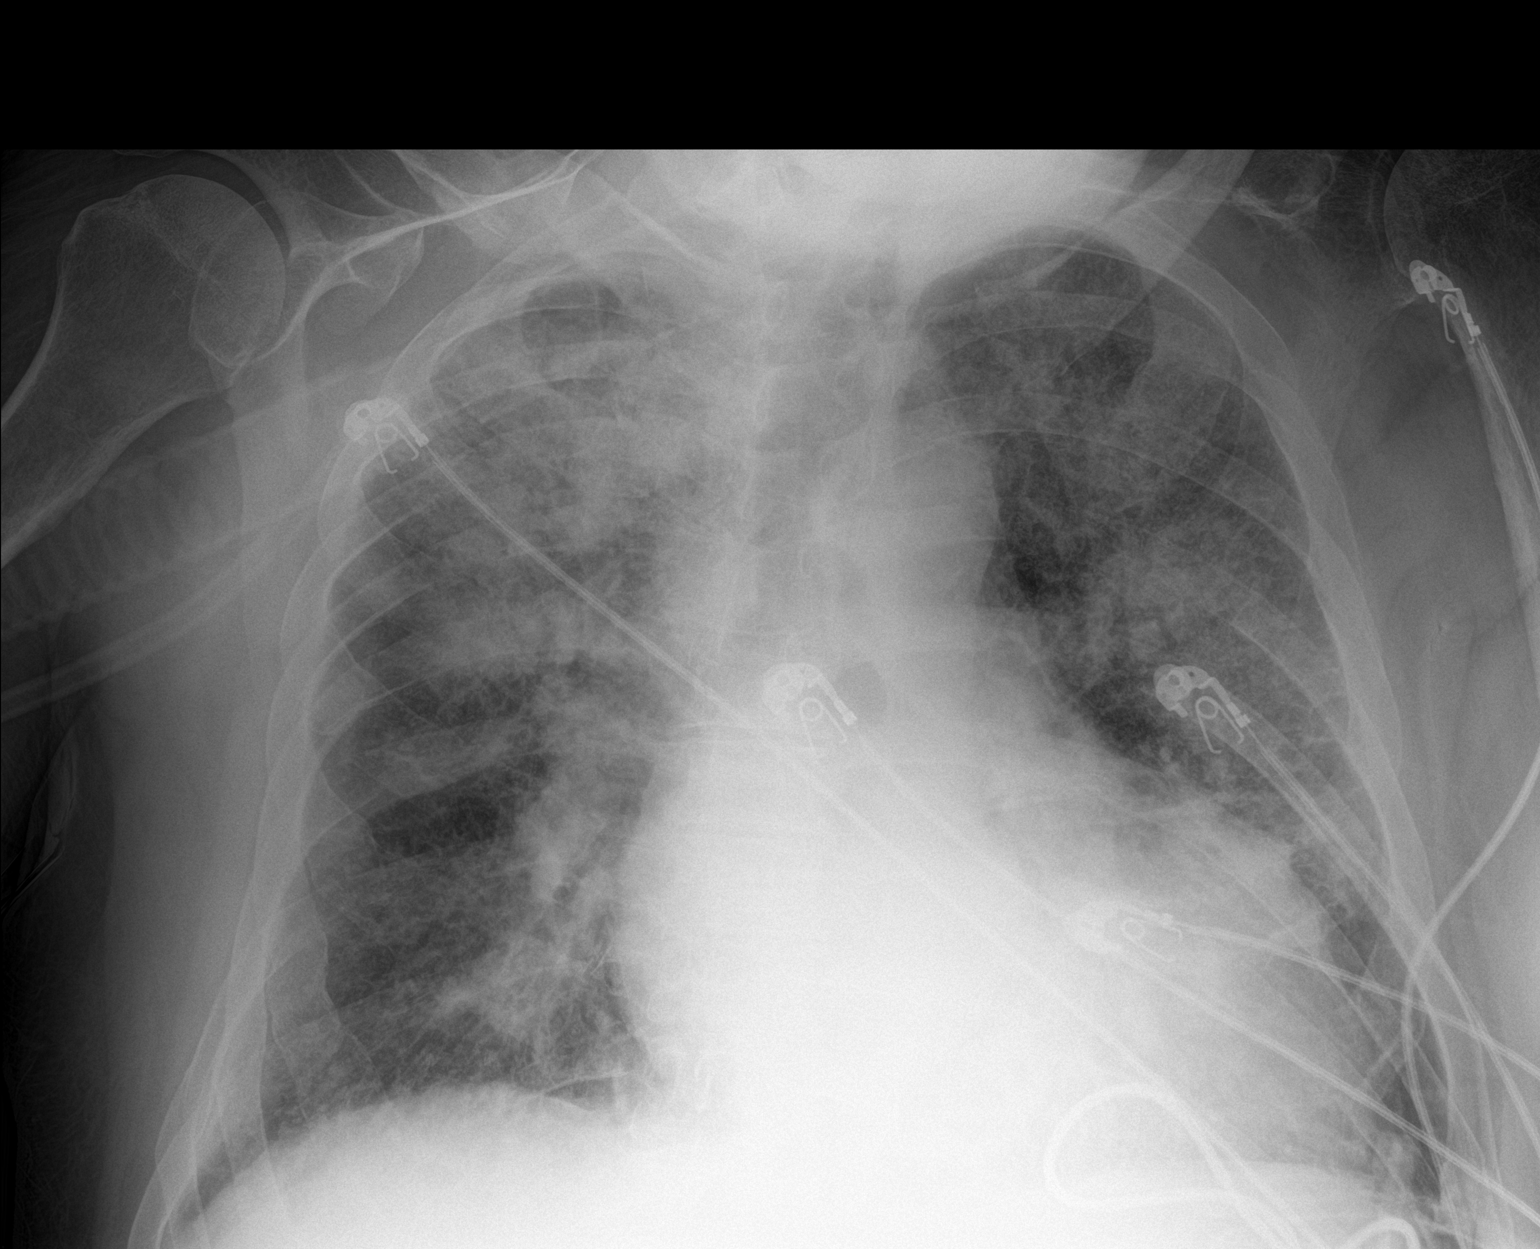

[1 of 1 positions shown; findings below may reference images not displayed]

FINDINGS: Cardiomegaly, similar to increased from prior. Diffuse patchy and
confluent opacities, most prominent in the perihilar and suprahilar
regions. Background peribronchial cuffing. No large pleural effusion
or pneumothorax.
IMPRESSION: Diffuse bilateral perihilar opacities and peribronchial thickening.
Considerations include pulmonary edema versus multifocal pneumonia.
Cardiomegaly is similar to mildly progressed from prior.

## 2019-11-08 IMAGING — CR DG CHEST 1V PORT
2 series · 2 of 2 positions shown · non-contrast
Comparison: 09/20/2017

CLINICAL DATA: Pneumonia

EXAM:
PORTABLE CHEST 1 VIEW

[AP (1 of 2)]
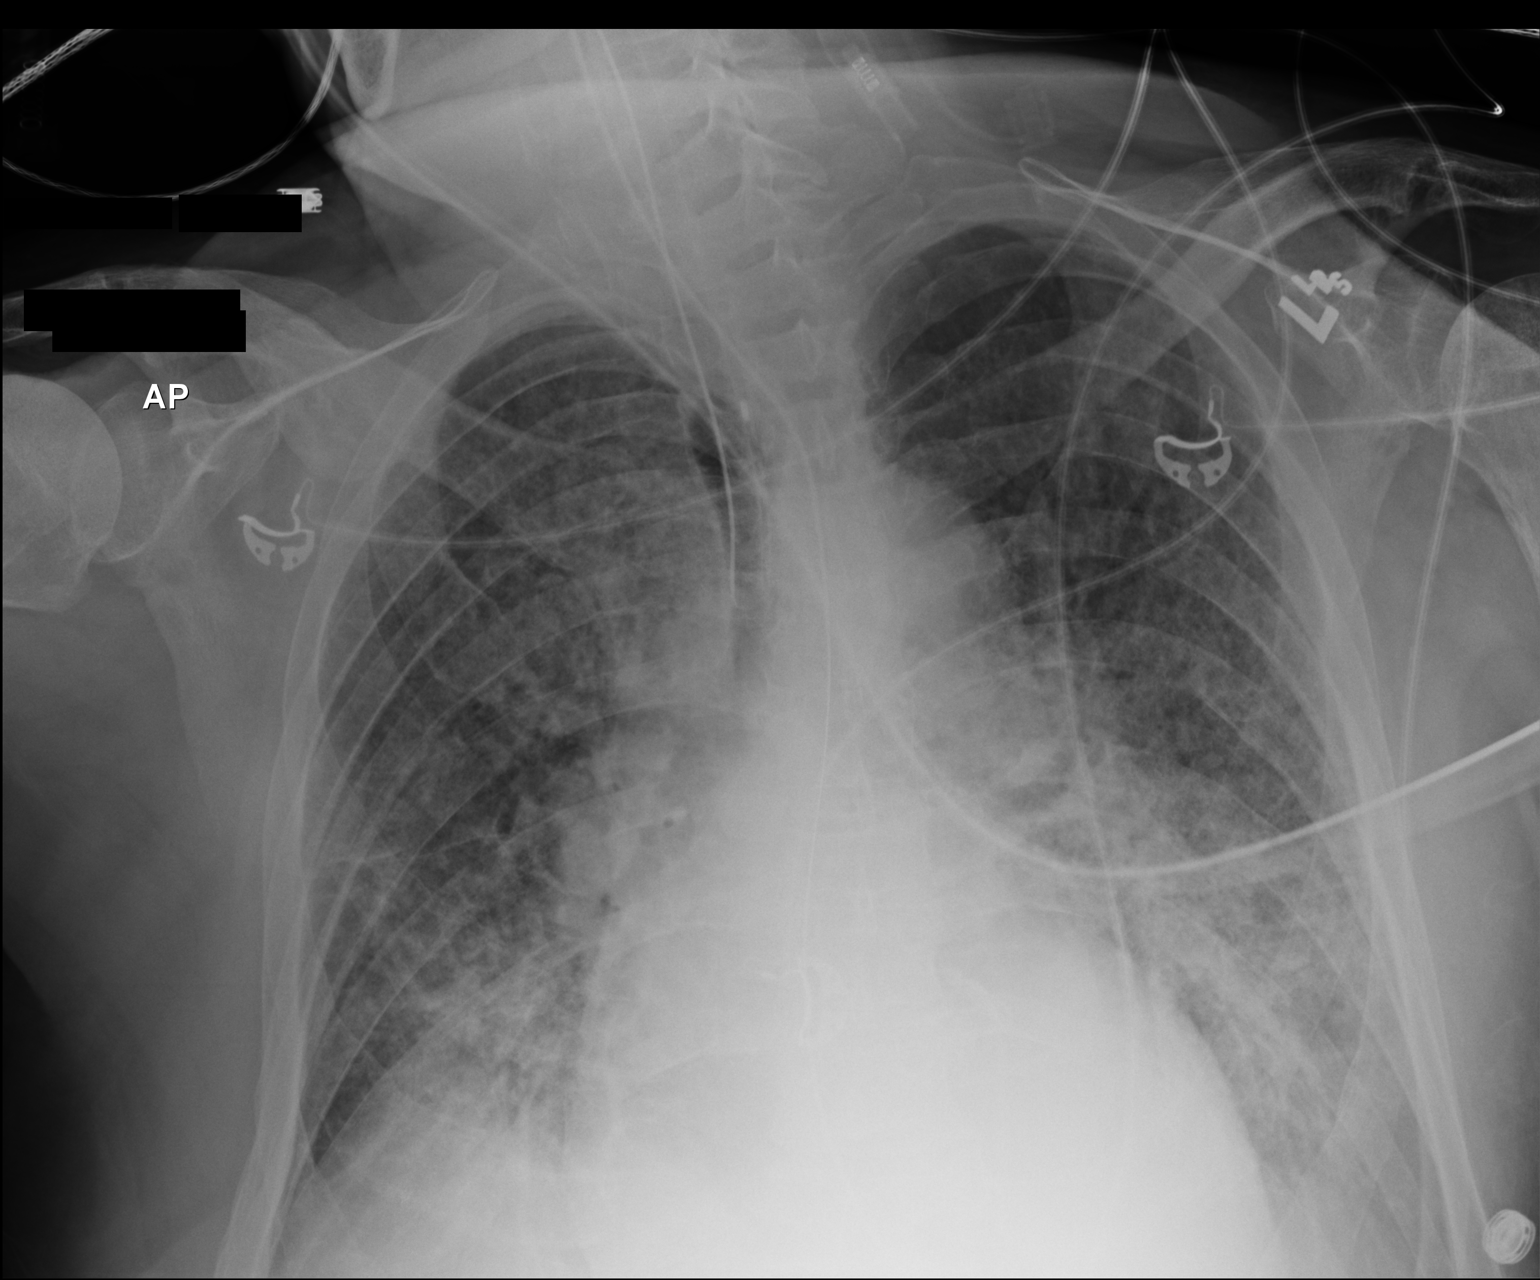

[AP (2 of 2)]
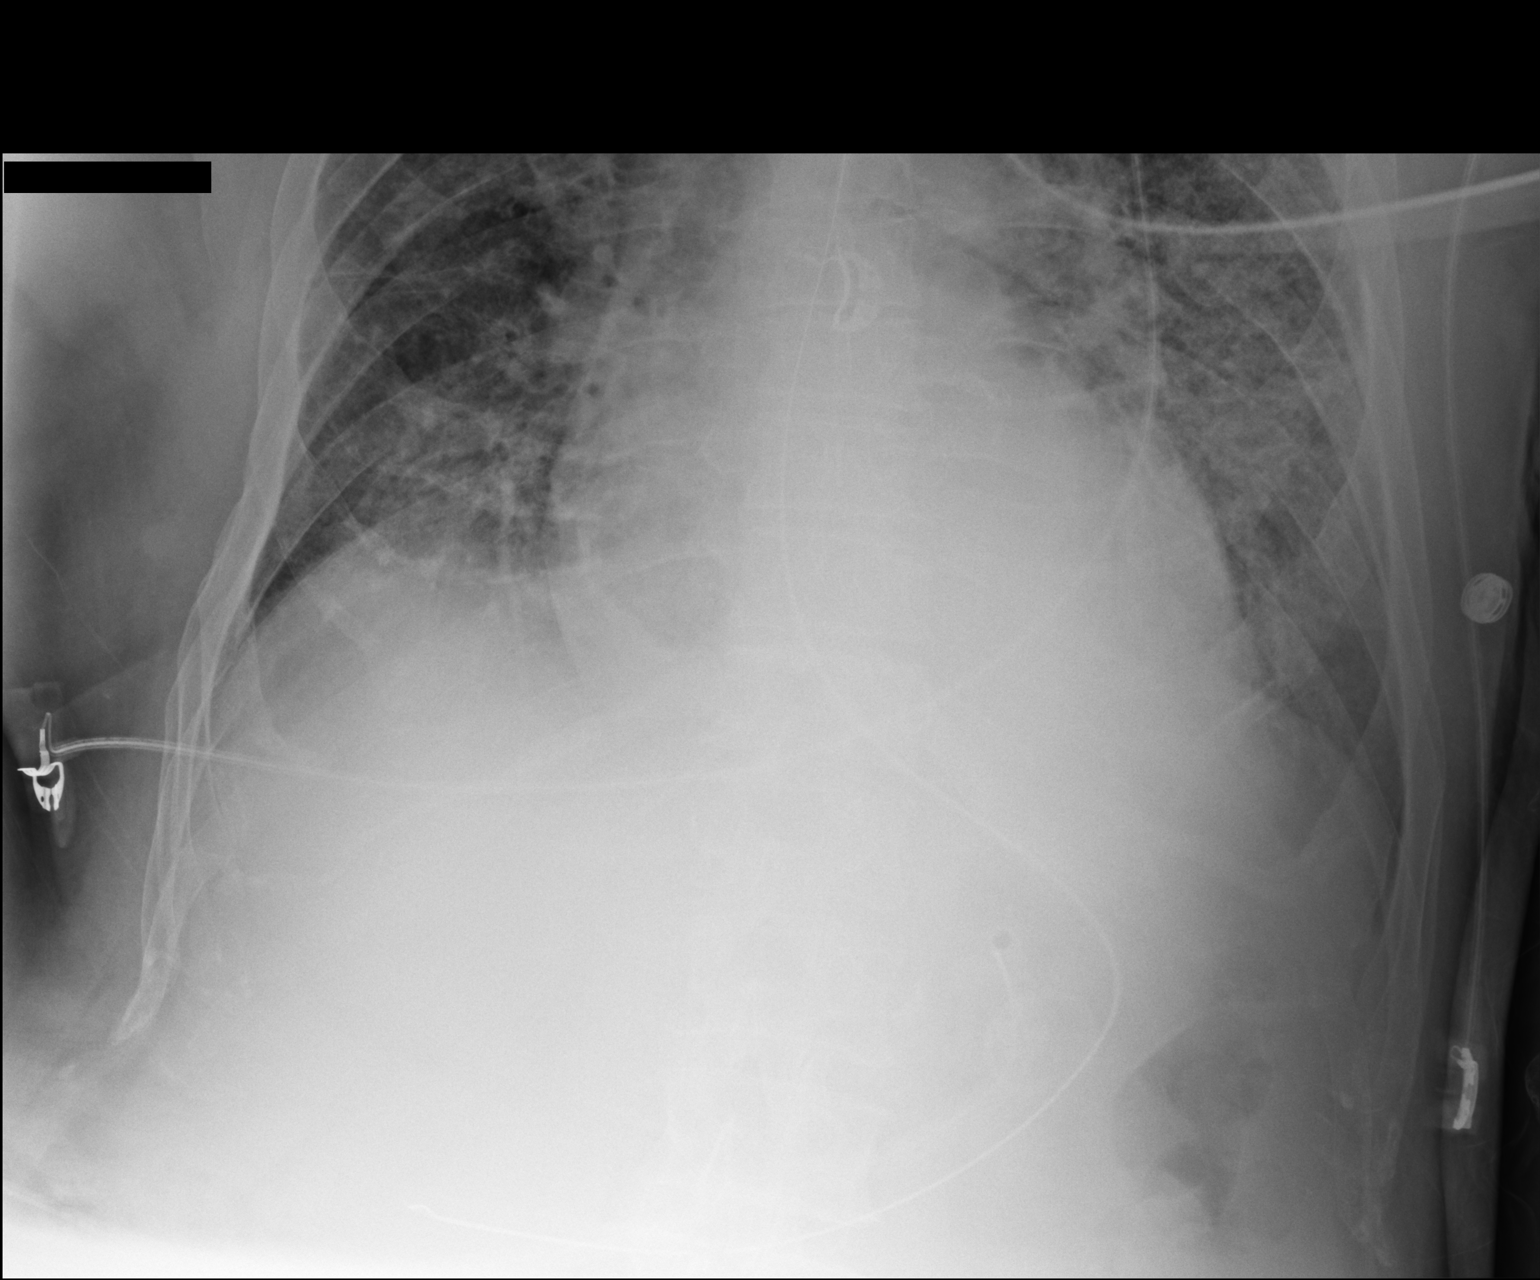

[2 of 2 positions shown; findings below may reference images not displayed]

FINDINGS: Extensive bilateral airspace disease. Mild improvement in bilateral
airspace disease especially the right upper lobe. Bibasilar
consolidation due to atelectasis. Negative for pleural effusion

Endotracheal tube in good position. NG tube enters the stomach with
the tip not visualized
IMPRESSION: Diffuse bilateral airspace disease with interval improvement.
Endotracheal tube remains in good position.

## 2019-11-09 IMAGING — DX DG CHEST 1V PORT
1 series · 1 of 1 positions shown · non-contrast
Comparison: 09/21/2017

CLINICAL DATA: Acute respiratory failure

EXAM:
PORTABLE CHEST 1 VIEW

[chest]
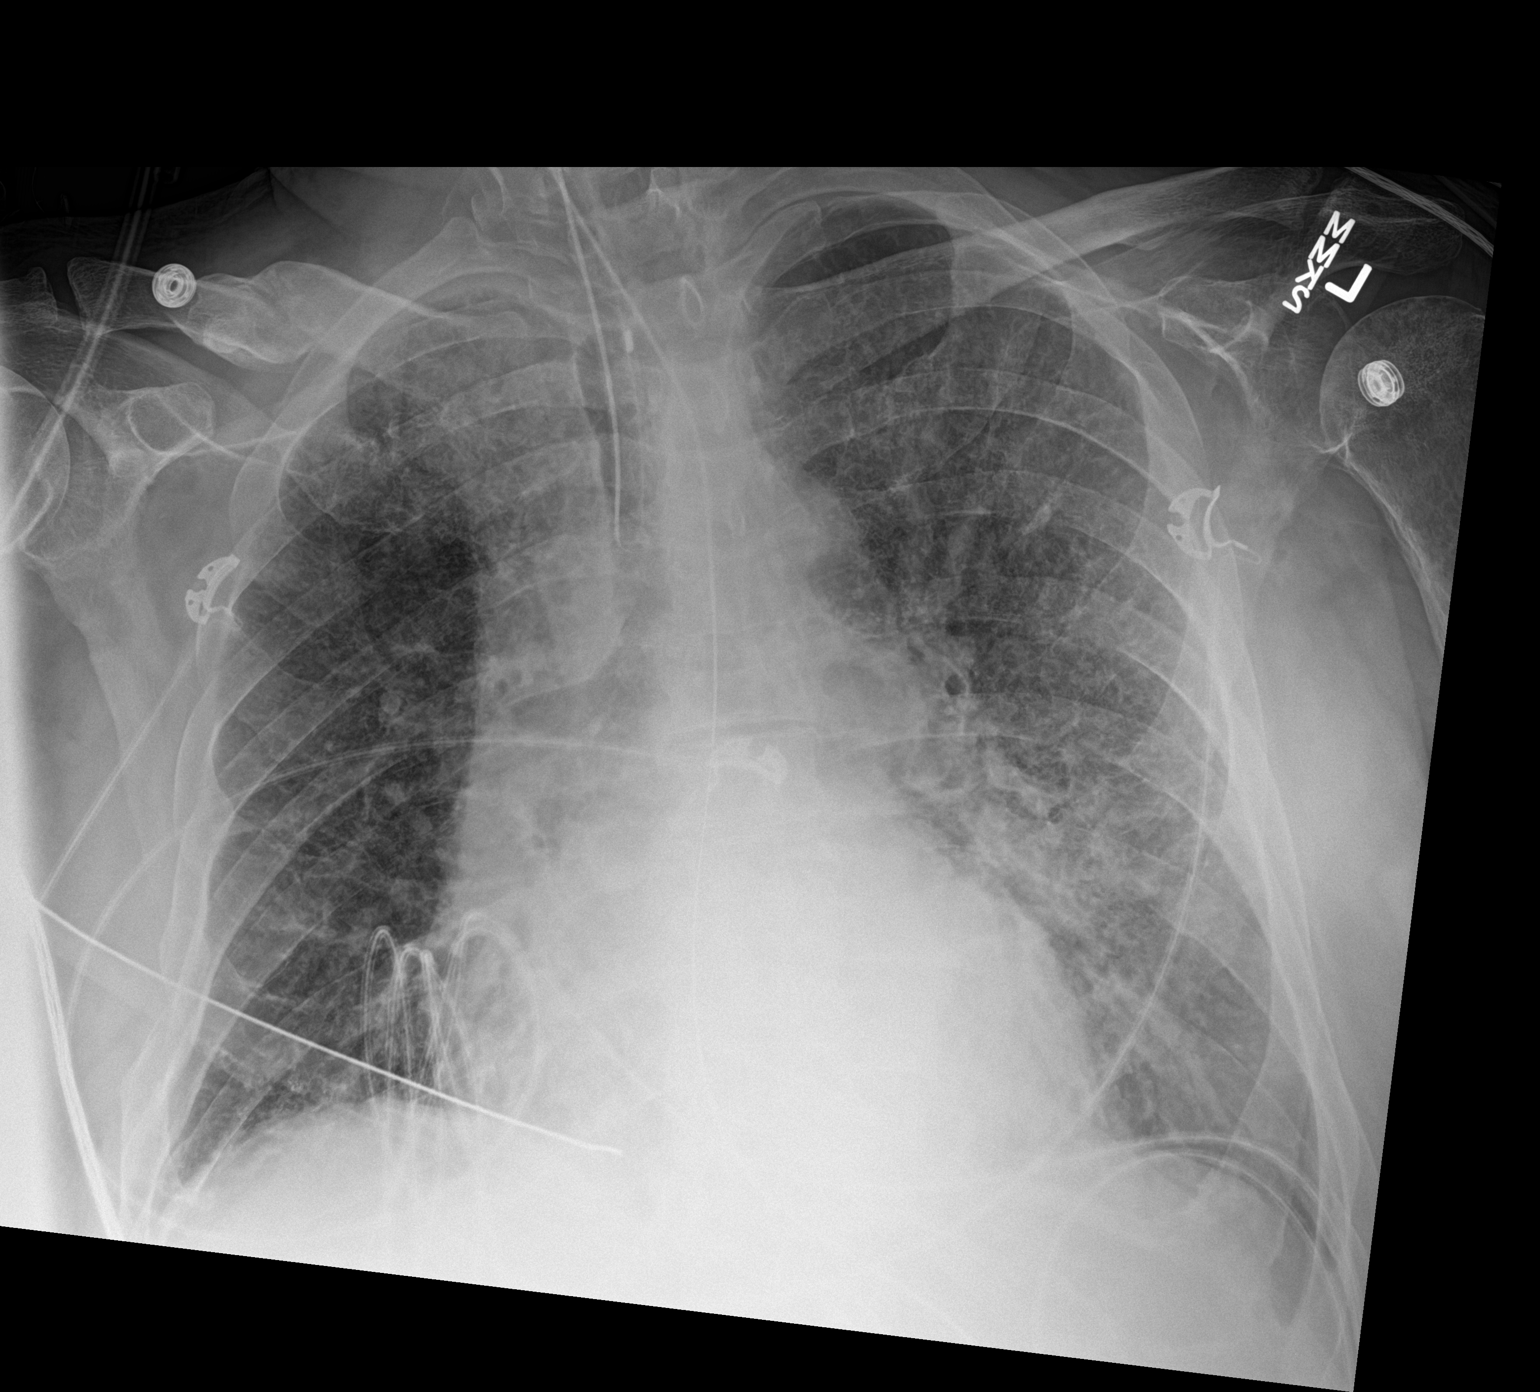

[1 of 1 positions shown; findings below may reference images not displayed]

FINDINGS: Endotracheal tube in good position.  Gastric tube in the stomach.

COPD. Improvement in diffuse bilateral airspace disease since the
prior study. Airspace disease most prominent in the left base. No
significant effusion
IMPRESSION: COPD. Diffuse bilateral airspace disease with interval improvement.
Possible clearing edema.

## 2019-11-09 IMAGING — CT CT ABD-PELV W/O CM
2 of 4 series · 15 of 46 positions shown, 17 images · non-contrast
Comparison: CT 04/07/2017

CLINICAL DATA: Patient with clostridium perfringens in blood
culture. Looking for abdominal/pelvic source to explain. Uncertain
as to history of diarrhea as patient presented with AMS and
currently intubated. Fever of unknown origin.

EXAM:
CT ABDOMEN AND PELVIS WITHOUT CONTRAST
TECHNIQUE: Multidetector CT imaging of the abdomen and pelvis was performed
following the standard protocol without IV contrast.

[Series 3: abd/ pelvis 5.0 i30f 2 · axial · 0.83mm/px · z∈[-881,-411]mm · 12 of 104 slices shown, 14 images]
[im 5/104  soft-tissue]
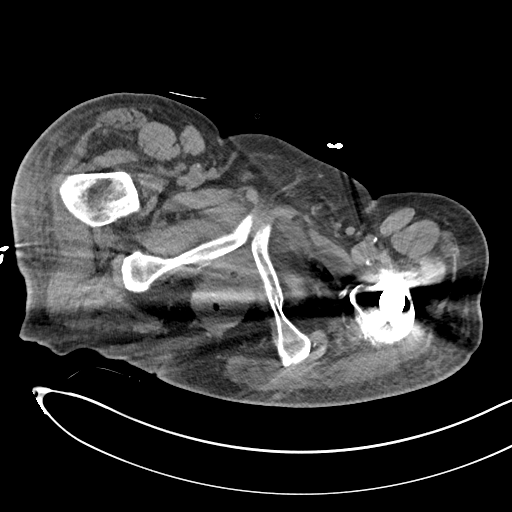
[im 5/104  bone]
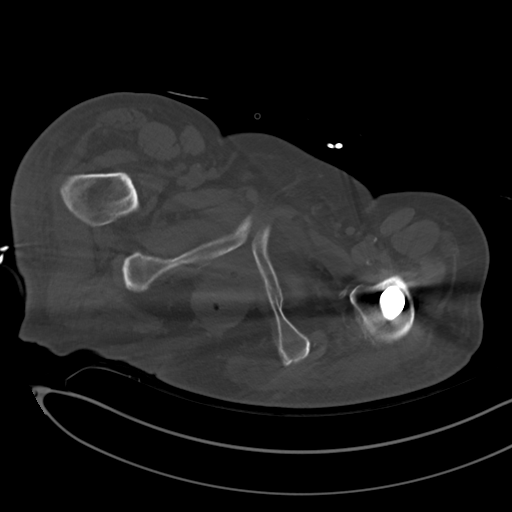
[im 15/104  soft-tissue]
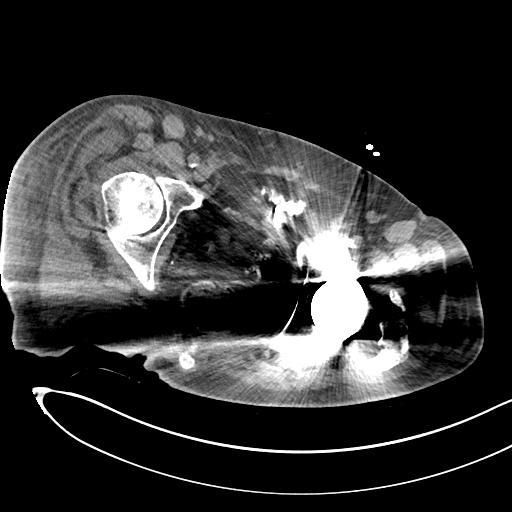
[im 24/104  soft-tissue]
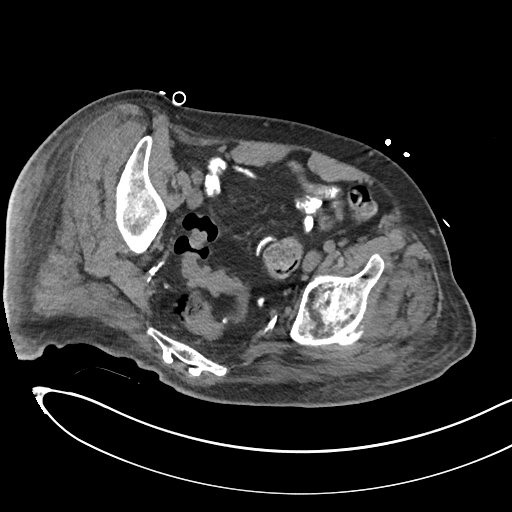
[im 33/104  soft-tissue]
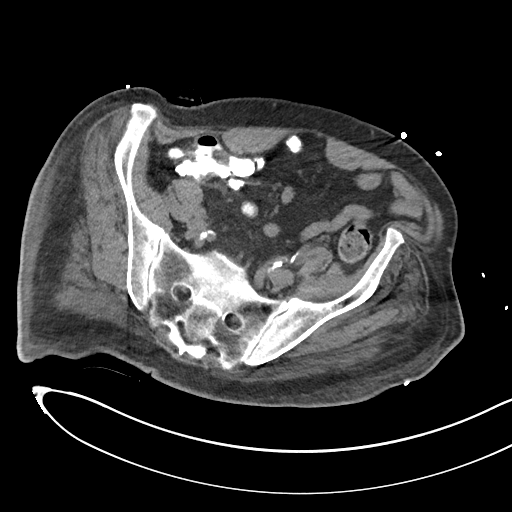
[im 38/104  soft-tissue]
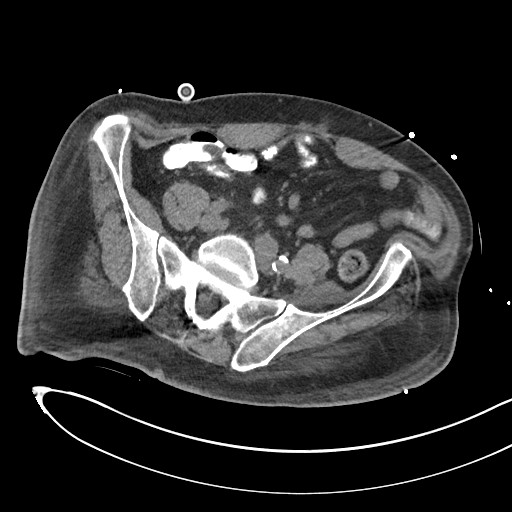
[im 47/104  soft-tissue]
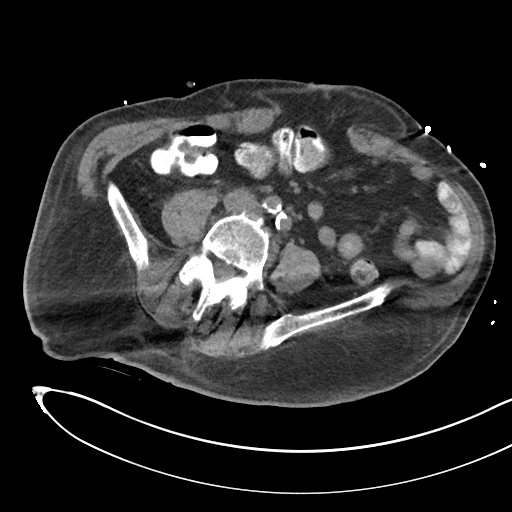
[im 57/104  soft-tissue]
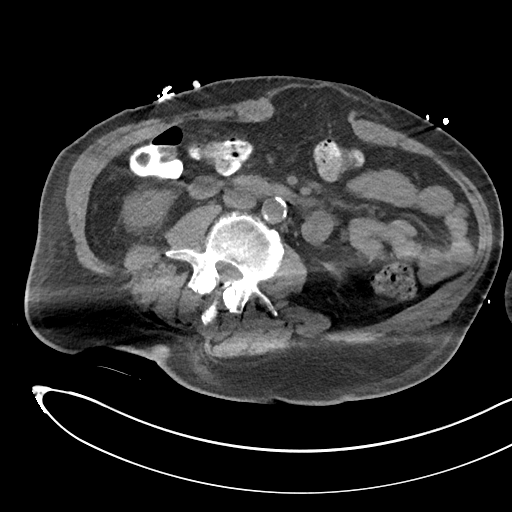
[im 66/104  soft-tissue]
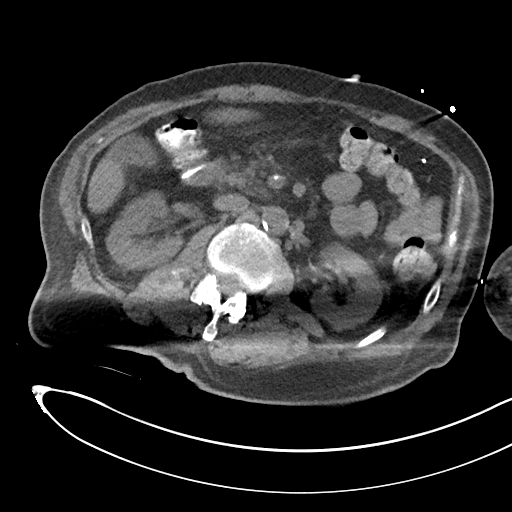
[im 71/104  soft-tissue]
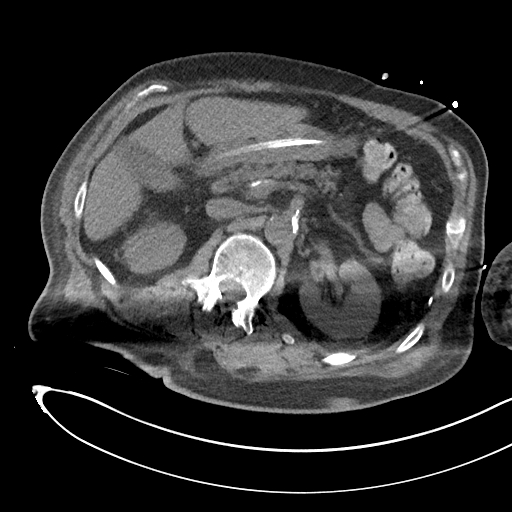
[im 71/104  bone]
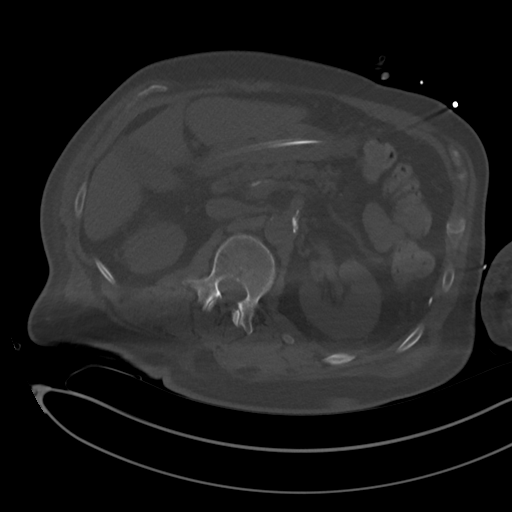
[im 80/104  soft-tissue]
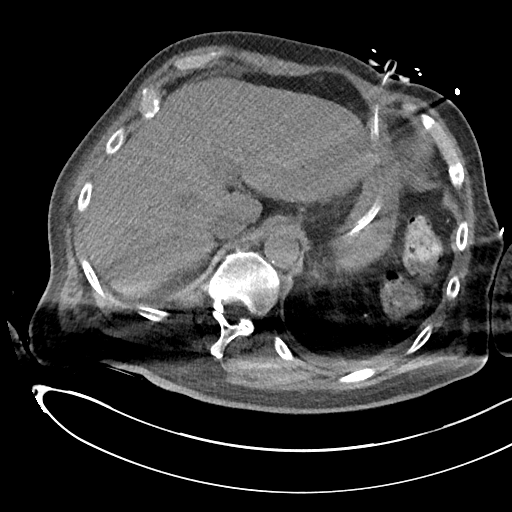
[im 89/104  soft-tissue]
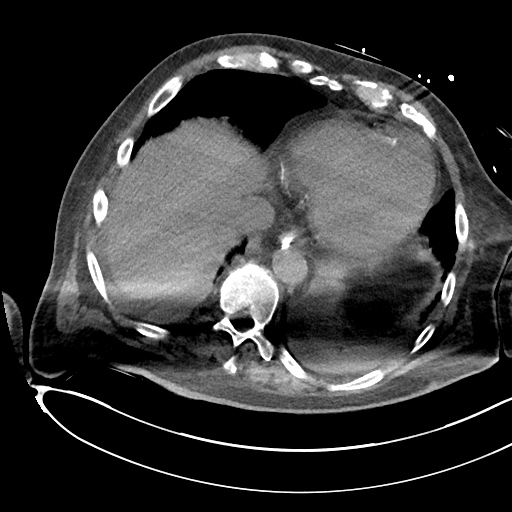
[im 99/104  soft-tissue]
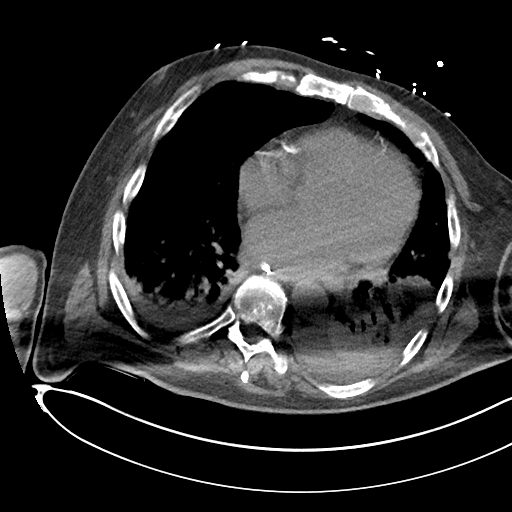

[Series 6: cor st · coronal · 0.77mm/px · 3 of 103 slices shown]
[im 35/103  soft-tissue]
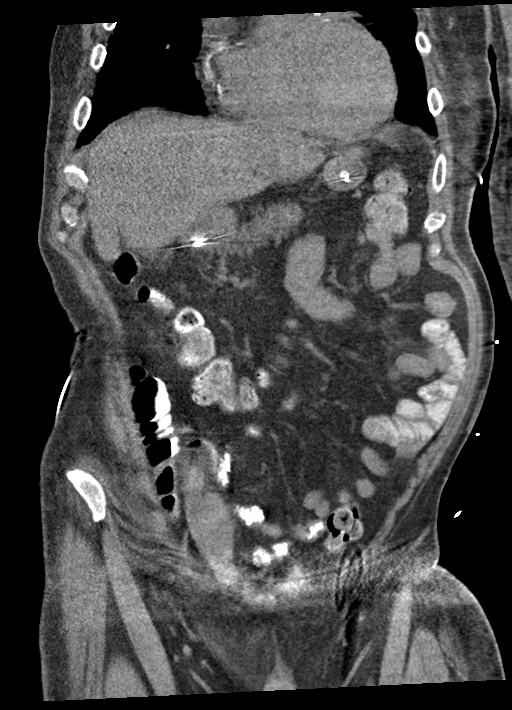
[im 46/103  soft-tissue]
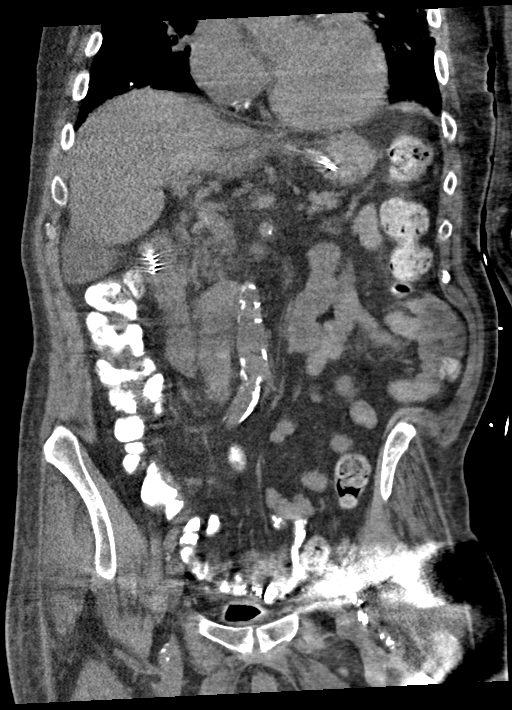
[im 57/103  soft-tissue]
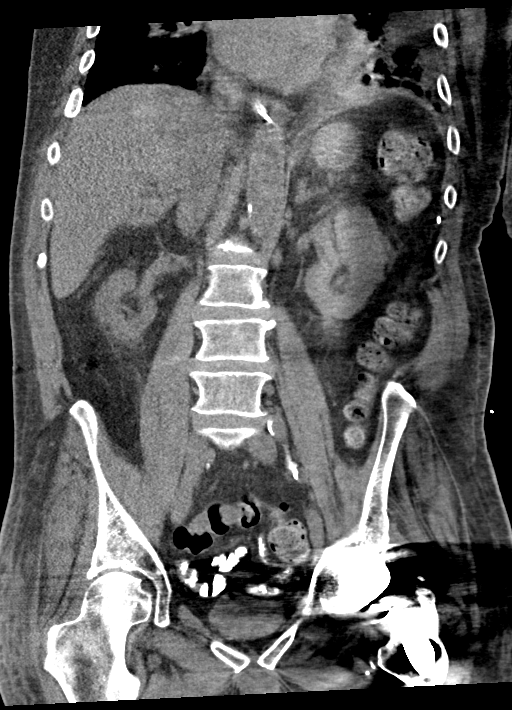

[15 of 46 positions shown; findings below may reference images not displayed]

FINDINGS: Lower chest: Small bilateral pleural effusions. Bilateral lower lobe
airspace disease, left greater than right. On the left this is out
of proportion of what expected for compressive atelectasis. Very
artery calcifications or stents. Mild cardiomegaly.

Hepatobiliary: No evidence of focal hepatic lesion allowing for lack
contrast. Gallbladder physiologically distended with layering stones
or sludge. No pericholecystic inflammation. No biliary dilatation.

Pancreas: No ductal dilatation or inflammation.

Spleen: Post splenectomy. Soft tissue deposit in the left upper
quadrant adjacent to surgical clips consistent with splenosis.

Adrenals/Urinary Tract: No adrenal nodule. No hydronephrosis. Mild
bilateral perinephric edema. Foley catheter decompresses the urinary
bladder.

Stomach/Bowel: Enteric tube with tip in the first portion of the
duodenum. Gastrostomy tube balloon in the stomach. The stomach is
decompressed. There is no bowel wall thickening or inflammatory
change. No liquid stool. Normal appendix tentatively identified. No
evidence of bowel inflammation.

Vascular/Lymphatic: Moderate to advanced aortic atherosclerosis. No
aneurysm. No bulky adenopathy.

Reproductive: Prostate is unremarkable.

Other: Streak artifact through the pelvis from left hip arthroplasty
limits pelvic evaluation. No intra-abdominal ascites. No free air or
abscess. Rectus abdominus diastasis of fat herniation.

Musculoskeletal: Left hip arthroplasty. Degenerative change in the
spine. No acute osseous abnormality.
IMPRESSION: 1. No localizing intra-abdominal/pelvic source of infection or
fever. No bowel inflammation.
2. Small bilateral pleural effusions with lower lobe opacities in
the lung bases. On the left this is greater than expected for
atelectasis, pneumonia or aspiration is favored. On the right this
may be atelectasis, pneumonia, or aspiration.
3.  Aortic Atherosclerosis ([id number]).

## 2019-11-11 IMAGING — CR DG CHEST 1V PORT
1 series · 1 of 1 positions shown · non-contrast
Comparison: 09/22/2017

CLINICAL DATA: Acute respiratory failure

EXAM:
PORTABLE CHEST 1 VIEW

[AP]
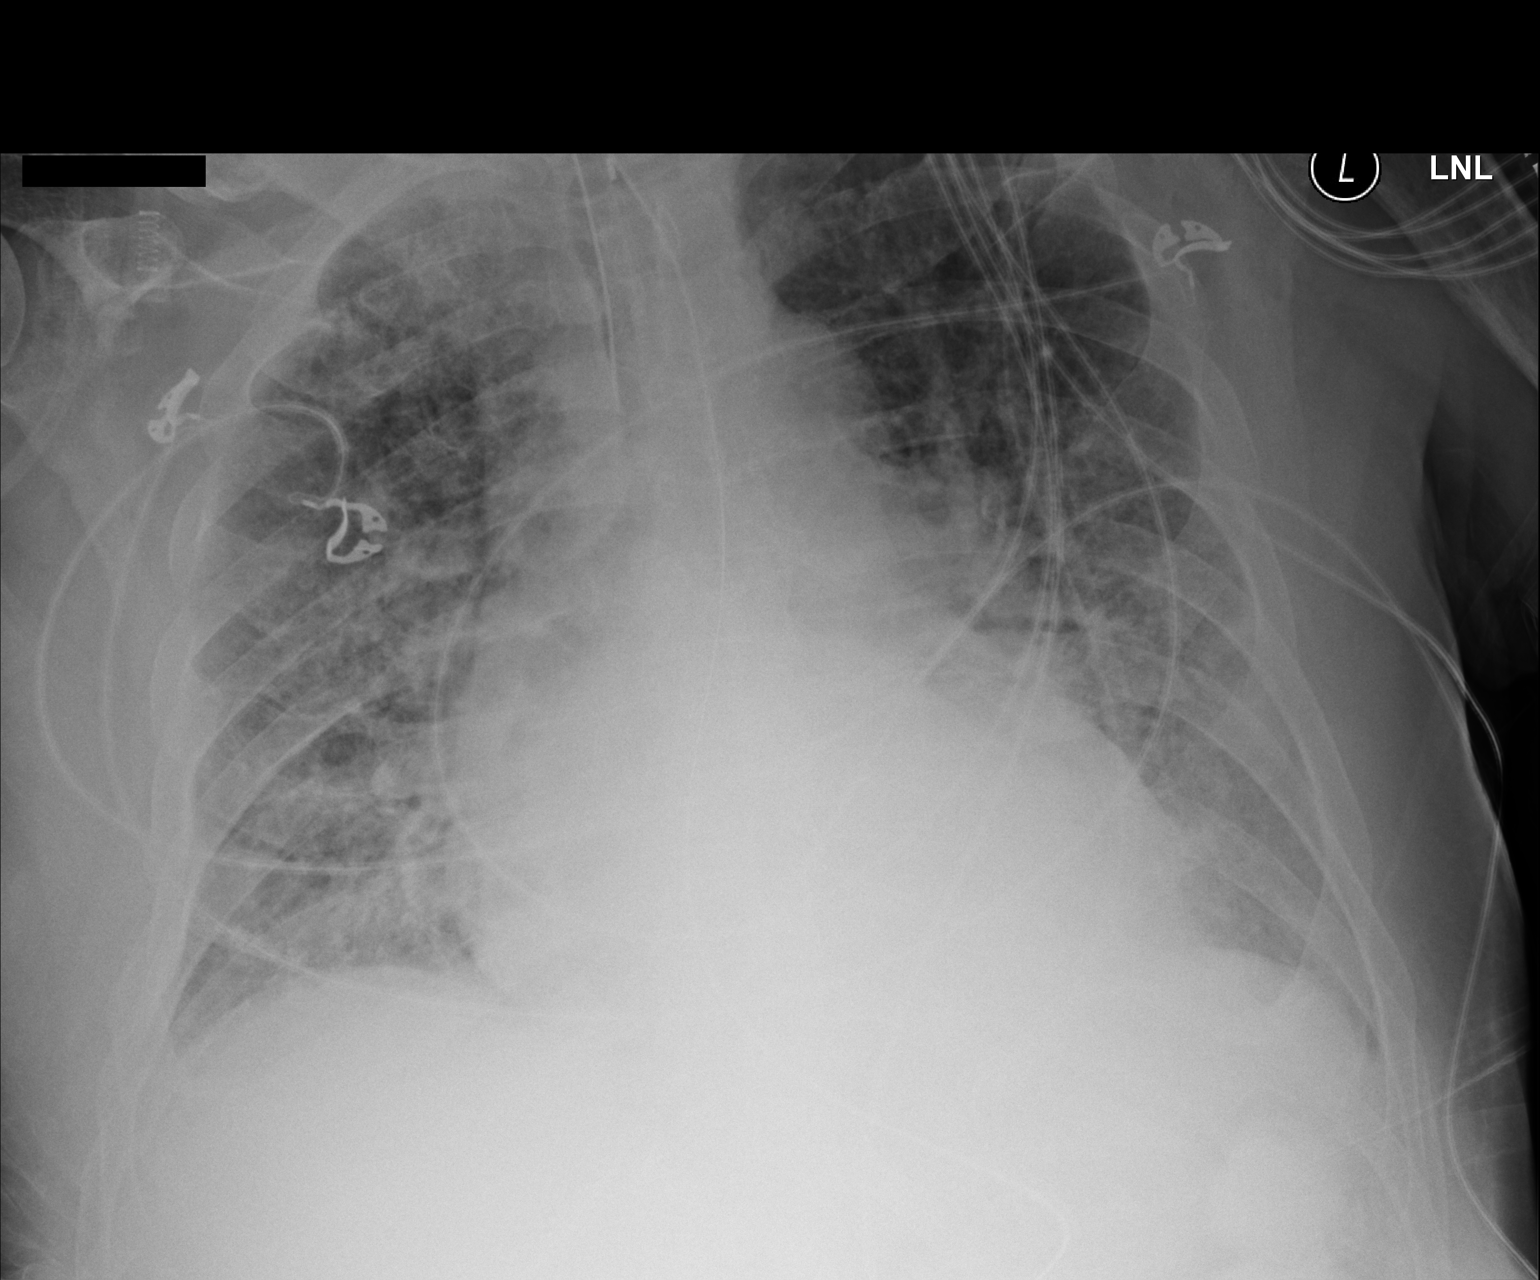

[1 of 1 positions shown; findings below may reference images not displayed]

FINDINGS: Endotracheal tube and NG tube are unchanged. Cardiomegaly. Diffuse
bilateral airspace disease has worsened since prior study. No
visible significant effusions or acute bony abnormality.
IMPRESSION: Extensive bilateral airspace disease, worsening since prior study.
This could represent edema or infection.

## 2019-11-13 IMAGING — DX DG CHEST 1V PORT
1 series · 1 of 1 positions shown · non-contrast
Comparison: September 25, 2017

CLINICAL DATA: Respiratory failure.  Status postextubation

EXAM:
PORTABLE CHEST 1 VIEW

[chest ap]
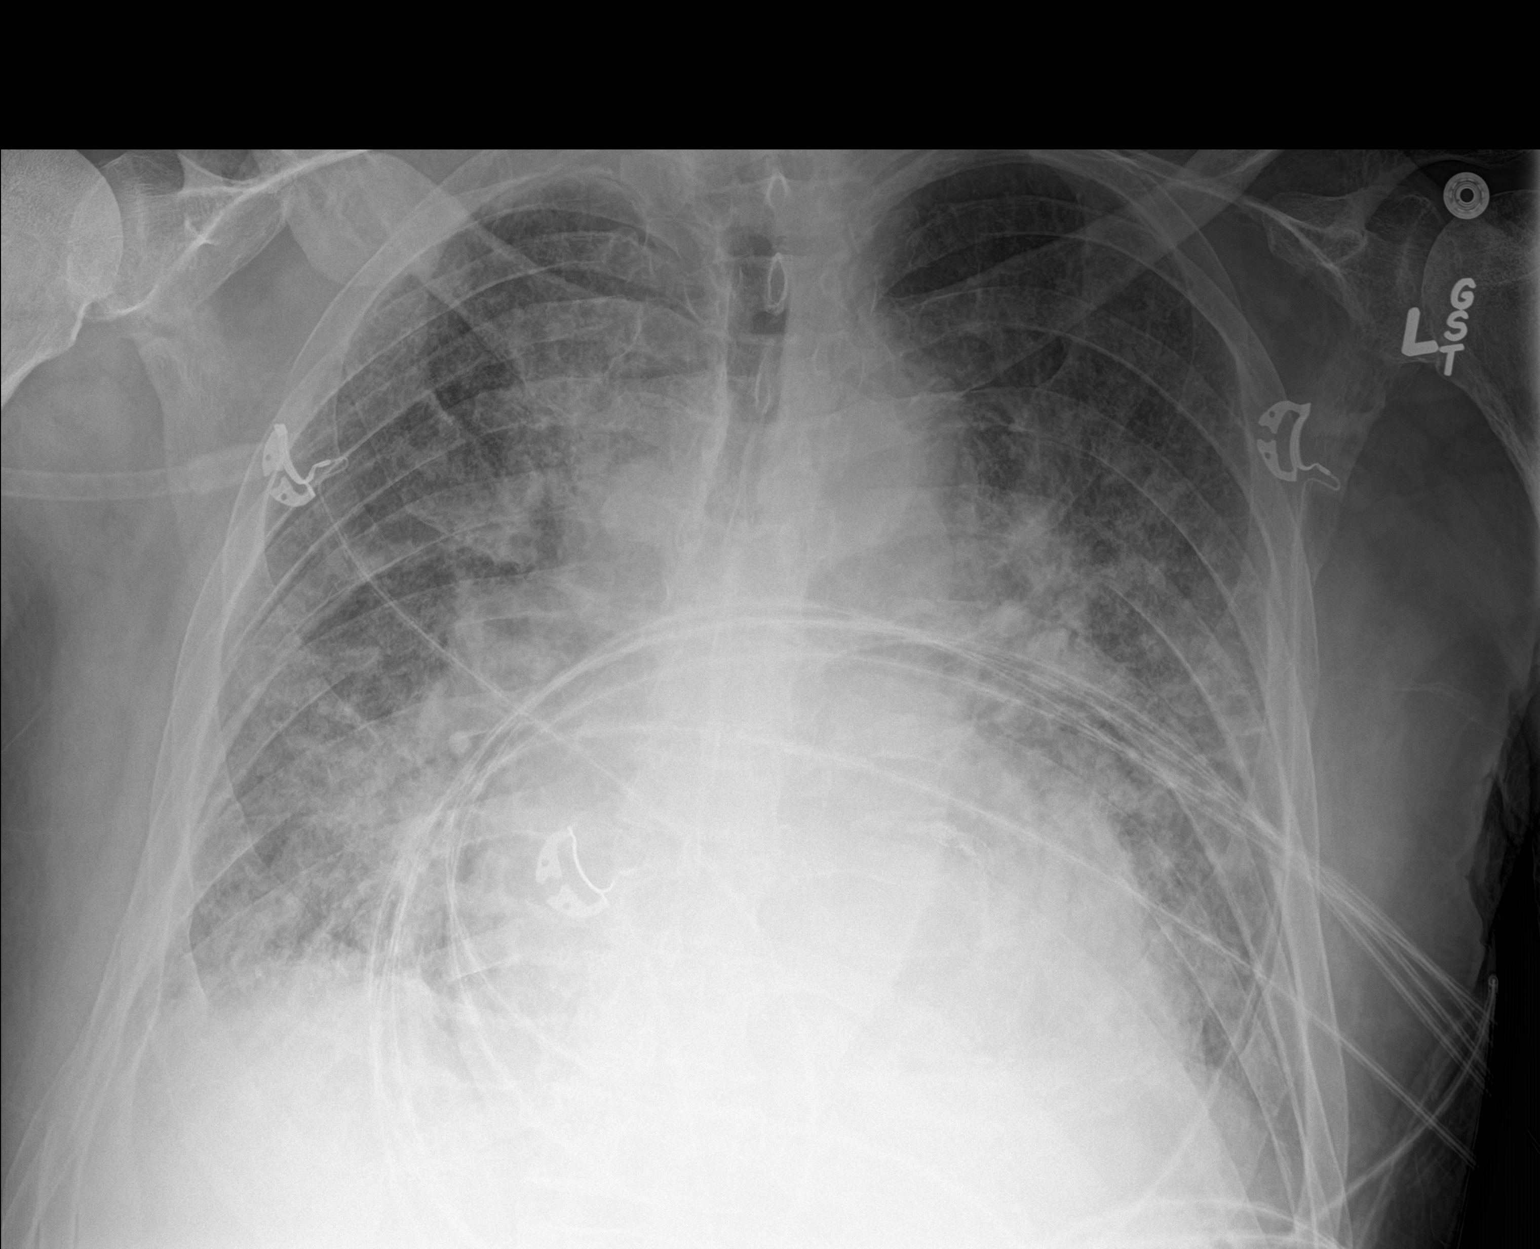

[1 of 1 positions shown; findings below may reference images not displayed]

FINDINGS: Endotracheal tube and nasogastric tube are no longer evident. No
pneumothorax. There are pleural effusions bilaterally with extensive
interstitial and alveolar opacities, likely diffuse pulmonary edema.
There is cardiomegaly with pulmonary venous hypertension. No
adenopathy is appreciable by radiography. There is an old healed
fracture of the right clavicle.
IMPRESSION: Endotracheal tube and nasogastric tube have been removed. No
pneumothorax. Diffuse pulmonary edema with small pleural effusions
noted. Underlying pulmonary venous hypertension and cardiomegaly.
Suspect congestive heart failure. There may well be a degree of
superimposed ARDS. Both entities may exist concurrently.

## 2019-11-14 IMAGING — DX DG CHEST 1V PORT
1 series · 1 of 1 positions shown · non-contrast
Comparison: 09/26/2017

CLINICAL DATA: Follow-up pneumonia

EXAM:
PORTABLE CHEST 1 VIEW

[chest ap]
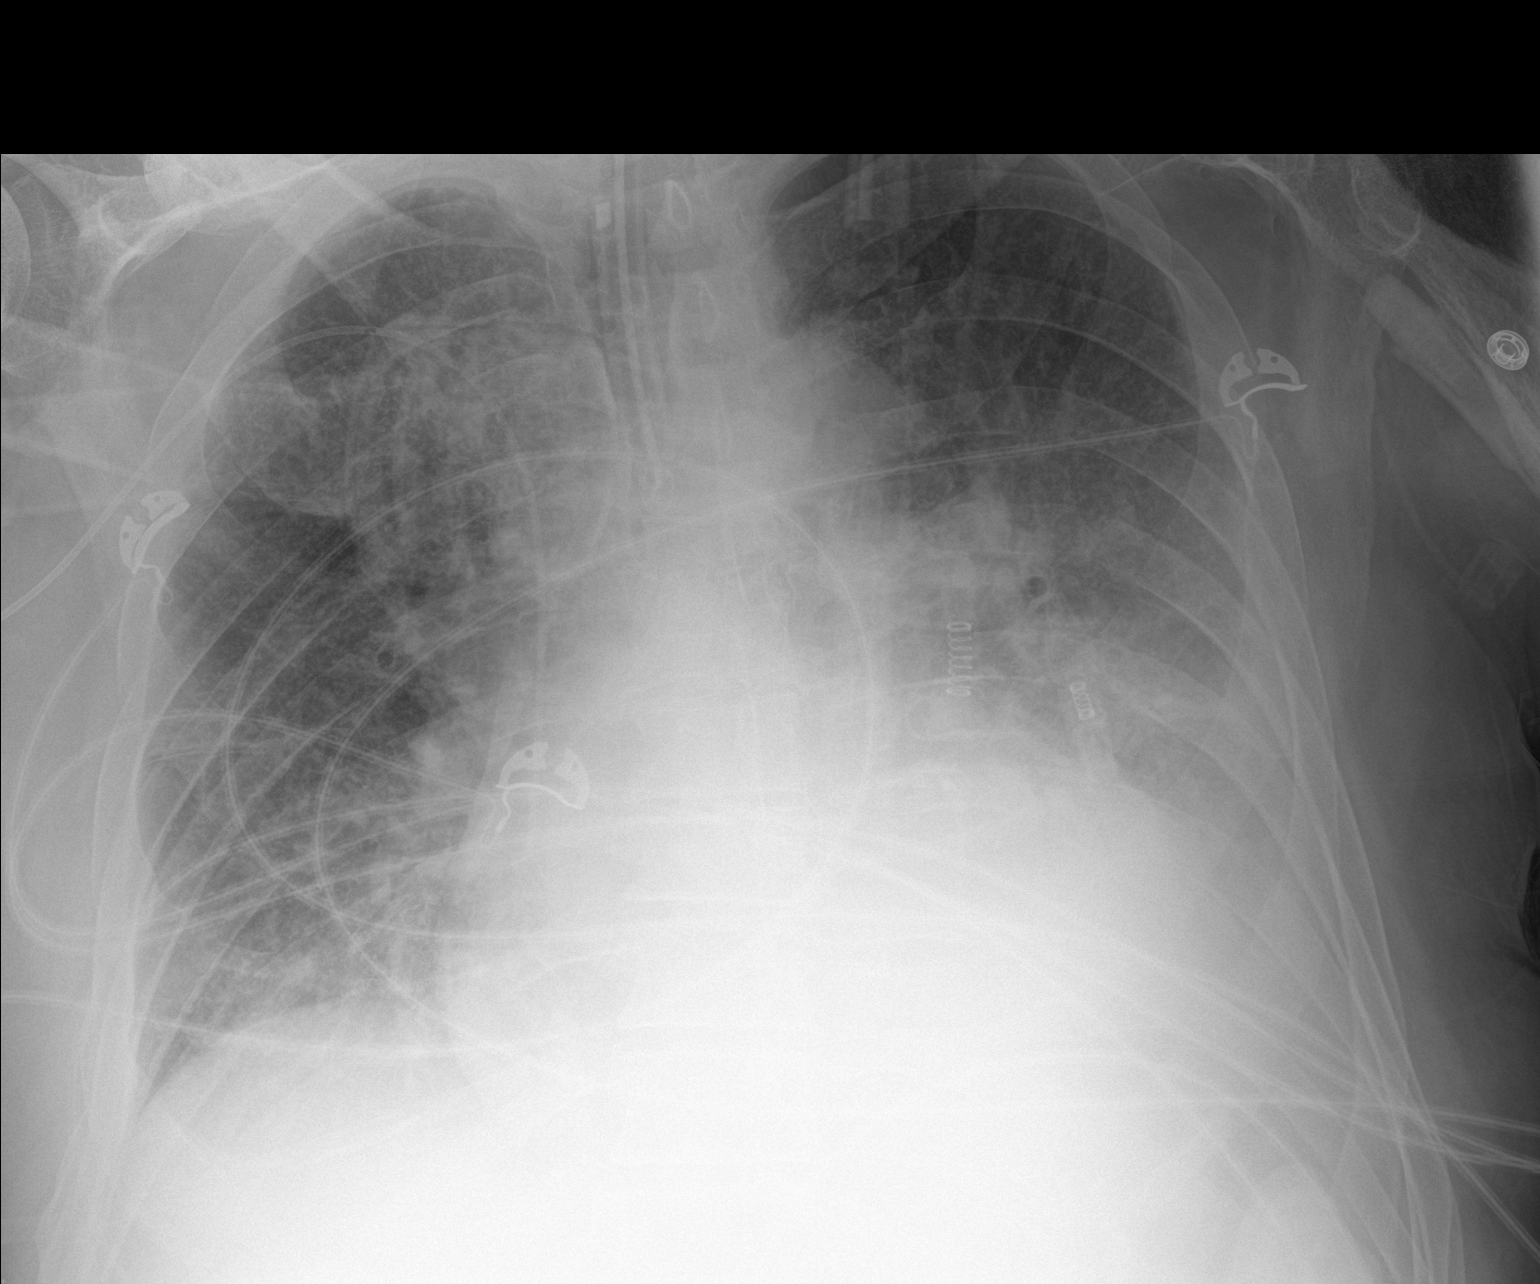

[1 of 1 positions shown; findings below may reference images not displayed]

FINDINGS: Endotracheal tube is again noted in satisfactory position.
Right-sided PICC line is stable in appearance. Cardiac shadow
remains enlarged. Diffuse bilateral infiltrates left greater than
right are noted. Left-sided pleural effusion is noted as well. No
acute bony abnormality is seen.
IMPRESSION: No significant interval change from the prior exam.

## 2019-11-14 IMAGING — CT CT HEAD W/O CM
3 series · 16 of 47 positions shown, 19 images · non-contrast
Comparison: 09/22/2017 CT head.

CLINICAL DATA: 61 y/o M; altered mental status. Patient in
respiratory distress and intubated.

EXAM:
CT HEAD WITHOUT CONTRAST
TECHNIQUE: Contiguous axial images were obtained from the base of the skull
through the vertex without intravenous contrast.

[Series 3: head 5.0 h30s · axial · 0.44mm/px · z∈[-134,+26]mm · 10 of 38 slices shown, 13 images]
[im 3/38  brain]
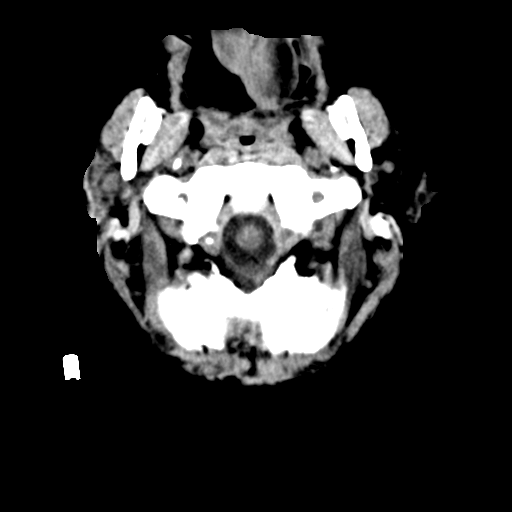
[im 3/38  bone]
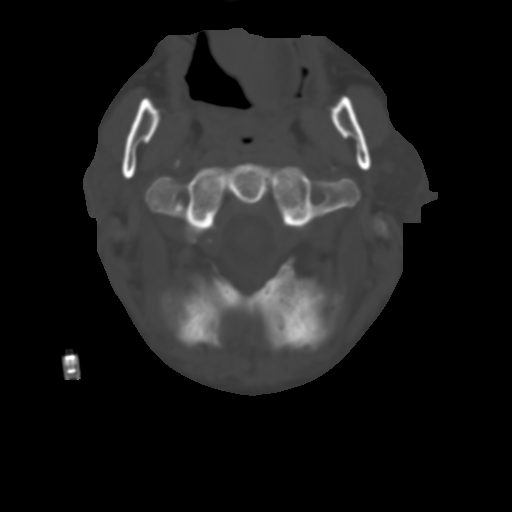
[im 7/38  brain]
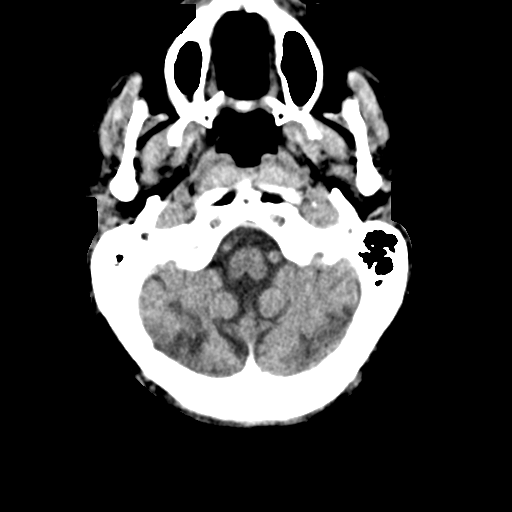
[im 11/38  brain]
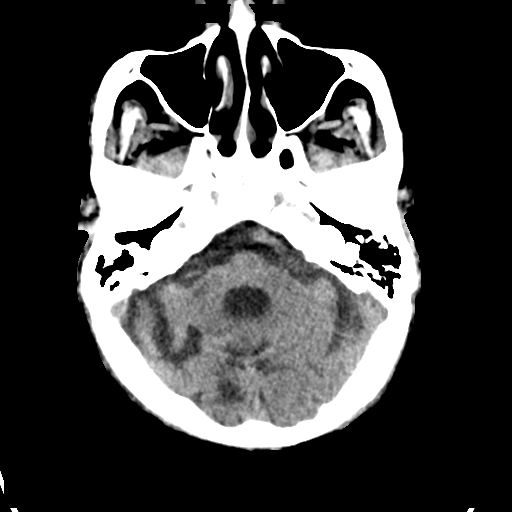
[im 13/38  brain]
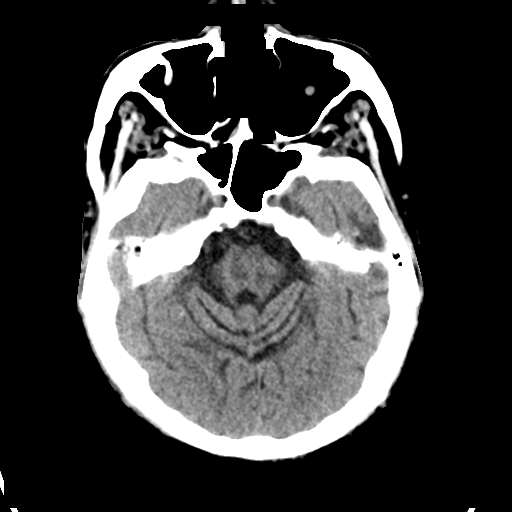
[im 17/38  brain]
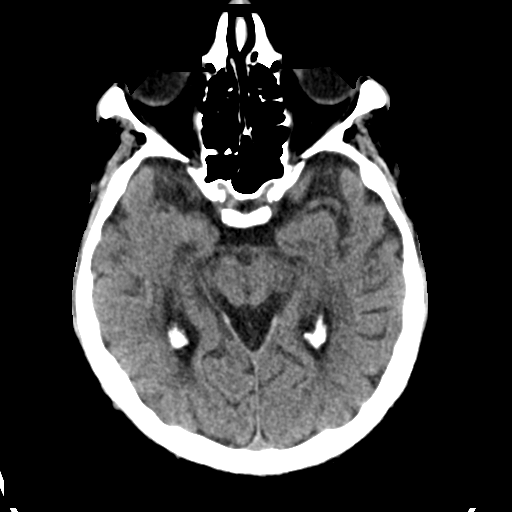
[im 17/38  bone]
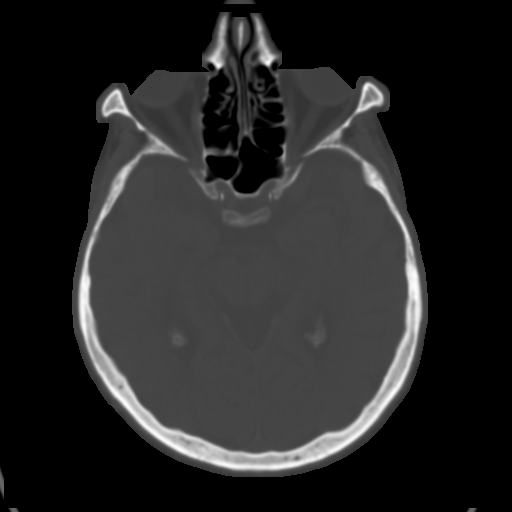
[im 21/38  brain]
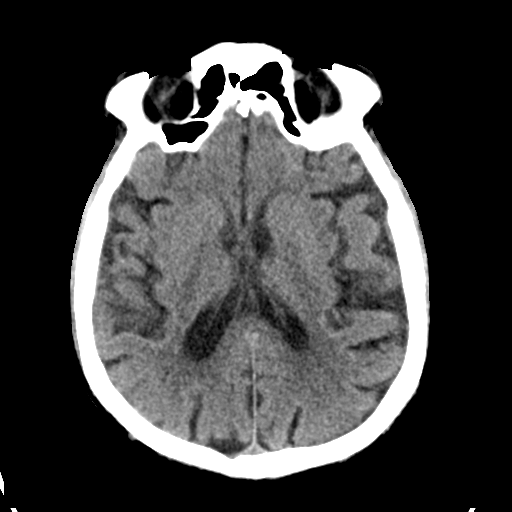
[im 25/38  brain]
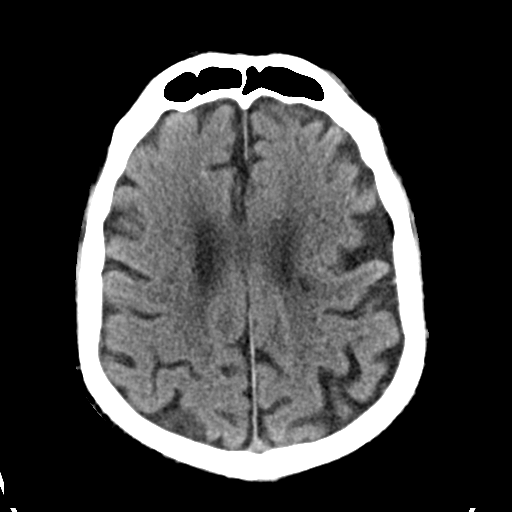
[im 29/38  brain]
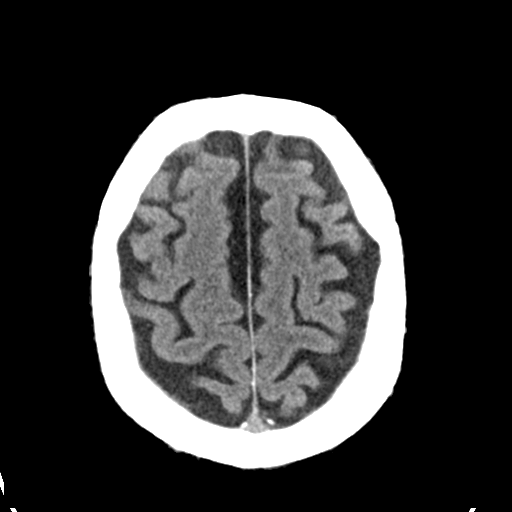
[im 31/38  brain]
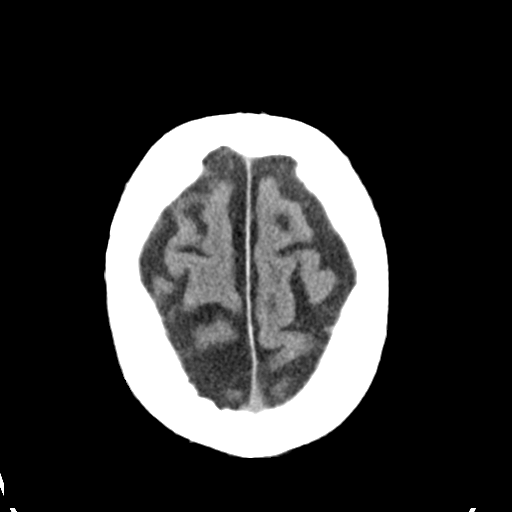
[im 31/38  bone]
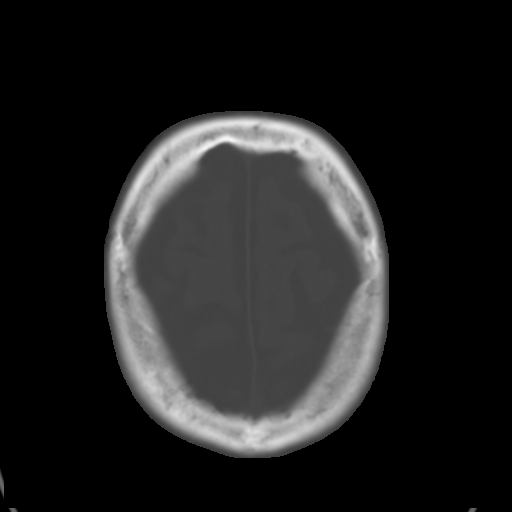
[im 35/38  brain]
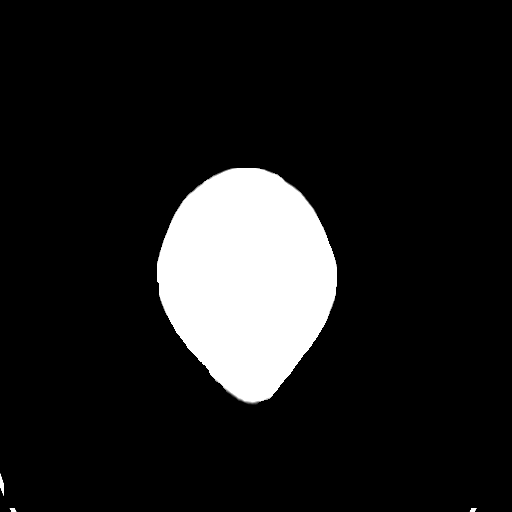

[Series 5: head 3.0 mpr cor · coronal · 0.36mm/px · 3 of 72 slices shown]
[im 24/72  brain]
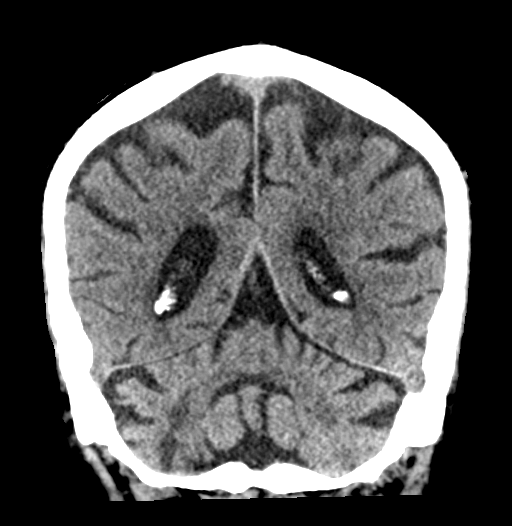
[im 32/72  brain]
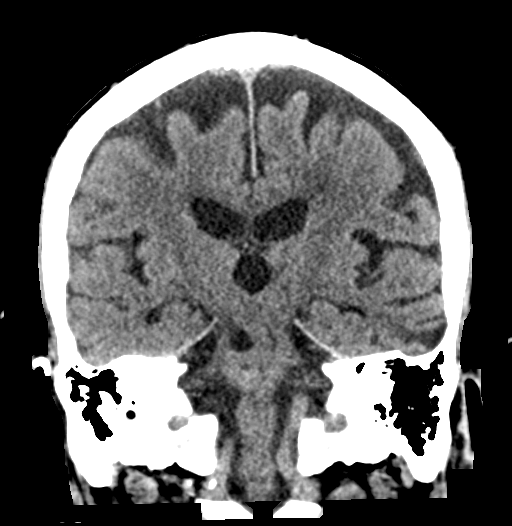
[im 40/72  brain]
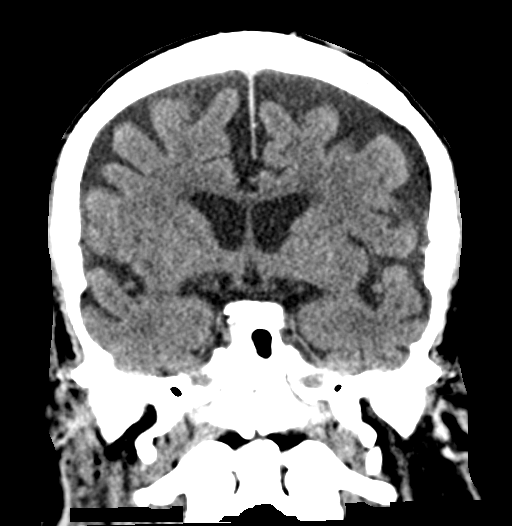

[Series 6: head 3.0 mpr sag · sagittal · 0.37mm/px · 3 of 63 slices shown]
[im 21/63  brain]
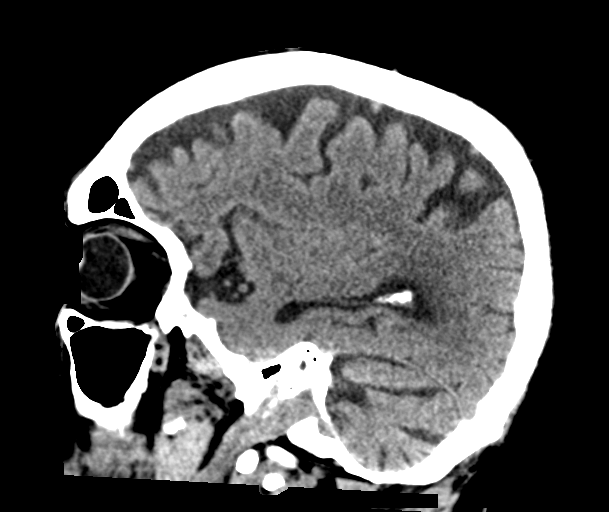
[im 32/63  brain]
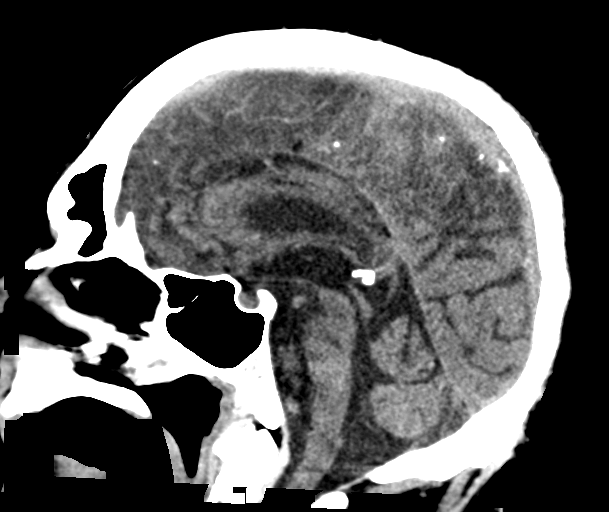
[im 42/63  brain]
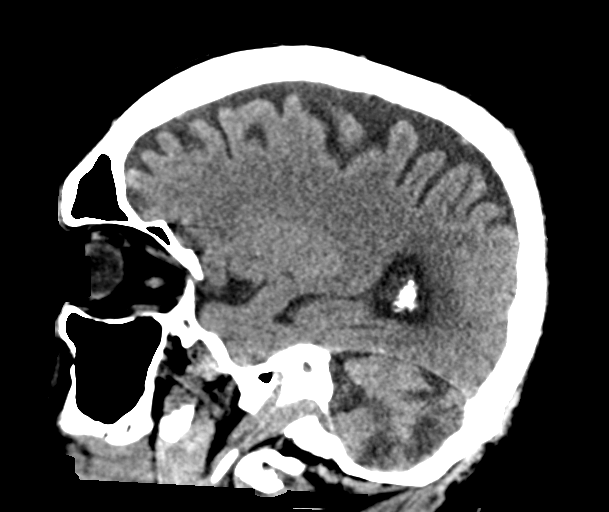

[16 of 47 positions shown; findings below may reference images not displayed]

FINDINGS: Brain: No evidence of acute infarction, hemorrhage, hydrocephalus,
extra-axial collection or mass lesion/mass effect. Stable chronic
microvascular ischemic changes, multiple chronic infarcts within
bilateral cerebellar hemispheres, right occipital lobe, left
anterior temporal lobe, pons, and bilateral basal ganglia. Stable
brain parenchymal volume loss.

Vascular: Calcific atherosclerosis of carotid siphons. No hyperdense
vessel identified.

Skull: Normal. Negative for fracture or focal lesion.

Sinuses/Orbits: Stable right partial mastoid opacification. Small
bilateral maxillary sinus mucous retention cyst. Otherwise negative.

Other: None.
IMPRESSION: 1. No acute intracranial abnormality.
2. Stable chronic microvascular ischemic changes, multiple chronic
infarctions, and brain parenchymal volume loss.

By: Fladimy Suitt M.D.

## 2019-11-15 IMAGING — DX DG CHEST 1V PORT
1 series · 1 of 1 positions shown · non-contrast
Comparison: 09/27/2017

CLINICAL DATA: Acute respiratory failure with hypoxia. Follow-up.
Ventilator support.

EXAM:
PORTABLE CHEST 1 VIEW

[chest]
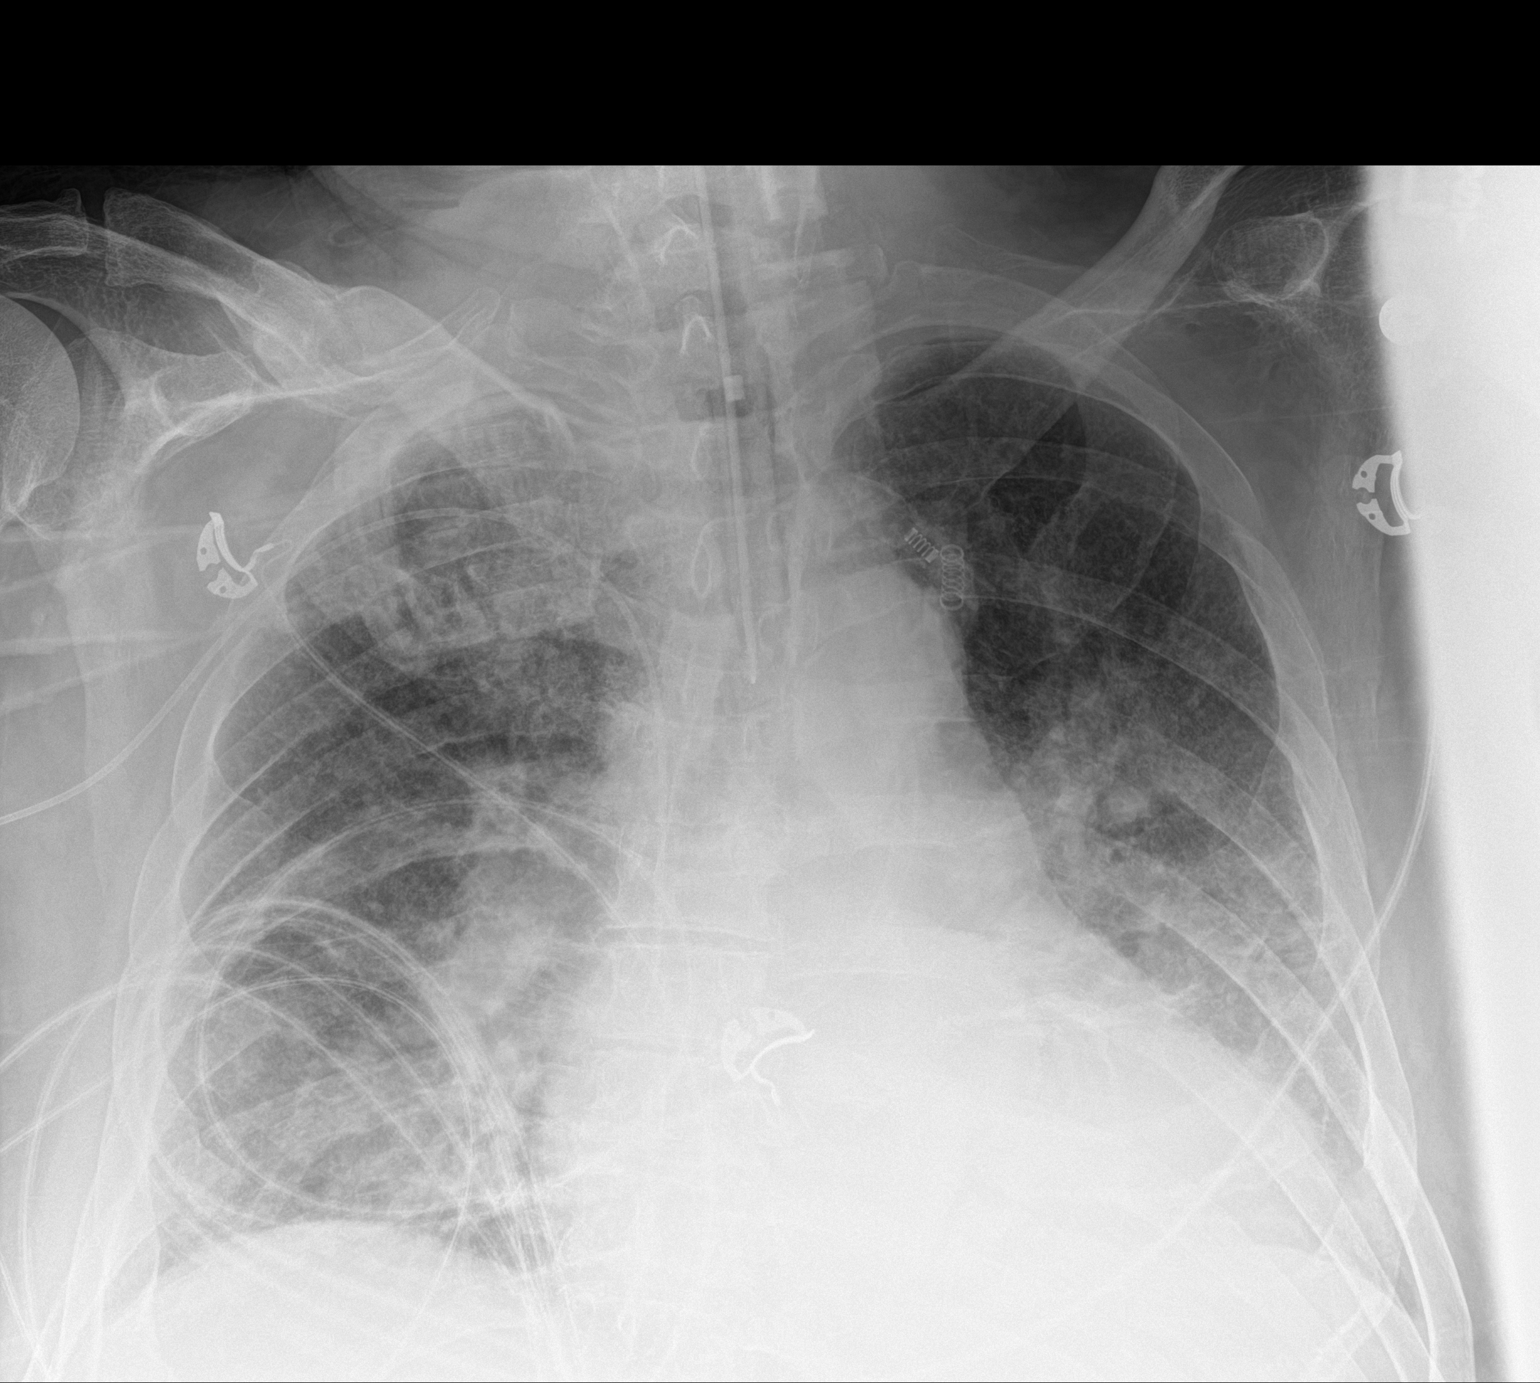

[1 of 1 positions shown; findings below may reference images not displayed]

FINDINGS: Endotracheal tube tip is 3 cm above the carina. Right arm PICC tip
in the SVC above the right atrium. Persistent bilateral infiltrates
most severe in the left lower lobe, similar to yesterday's study but
improved compared to the study of 2 days ago. No worsening or new
findings.
IMPRESSION: Stable since yesterday. Improved since 2 days ago. Bilateral
infiltrates left worse than right.

## 2019-11-16 IMAGING — DX DG CHEST 1V PORT
1 series · 1 of 1 positions shown · non-contrast
Comparison: 09/29/2017

CLINICAL DATA: Encounter for intubation

EXAM:
PORTABLE CHEST 1 VIEW

[chest ap]
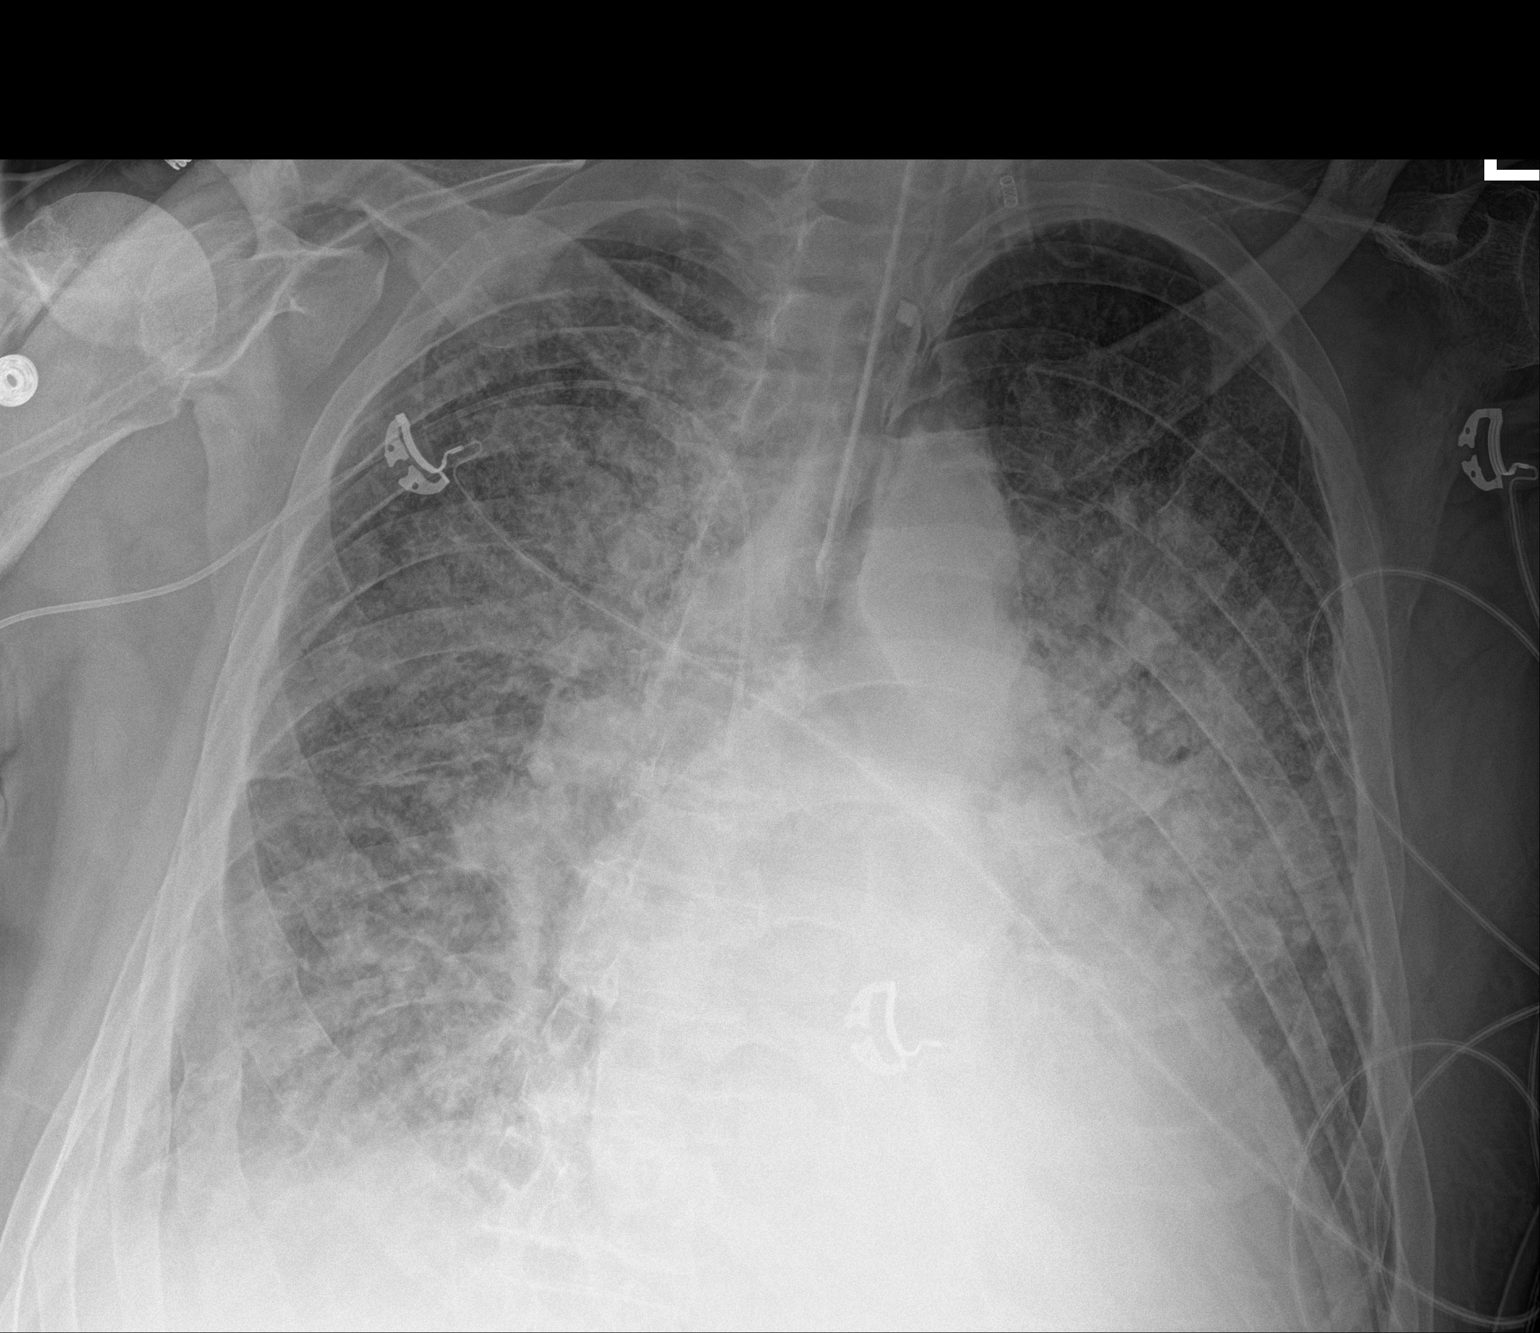

[1 of 1 positions shown; findings below may reference images not displayed]

FINDINGS: Endotracheal tube 15 mm above the carina. Recommend withdrawal 2-3
cm.

Right arm PICC tip in the SVC unchanged

Severe bilateral airspace disease similar to the prior study.
Improved lung volume following intubation. Small bilateral effusions
and bibasilar atelectasis.
IMPRESSION: Endotracheal tube 15 mm above the carina, recommend withdrawal 2-3
cm.

Severe bilateral airspace disease similar to earlier today. Improved
lung volume following intubation.

## 2019-11-16 IMAGING — DX DG CHEST 1V PORT
1 series · 1 of 1 positions shown · non-contrast
Comparison: Radiograph September 28, 2017.

CLINICAL DATA: Acute respiratory failure with hypoxia.

EXAM:
PORTABLE CHEST 1 VIEW

[chest ap]
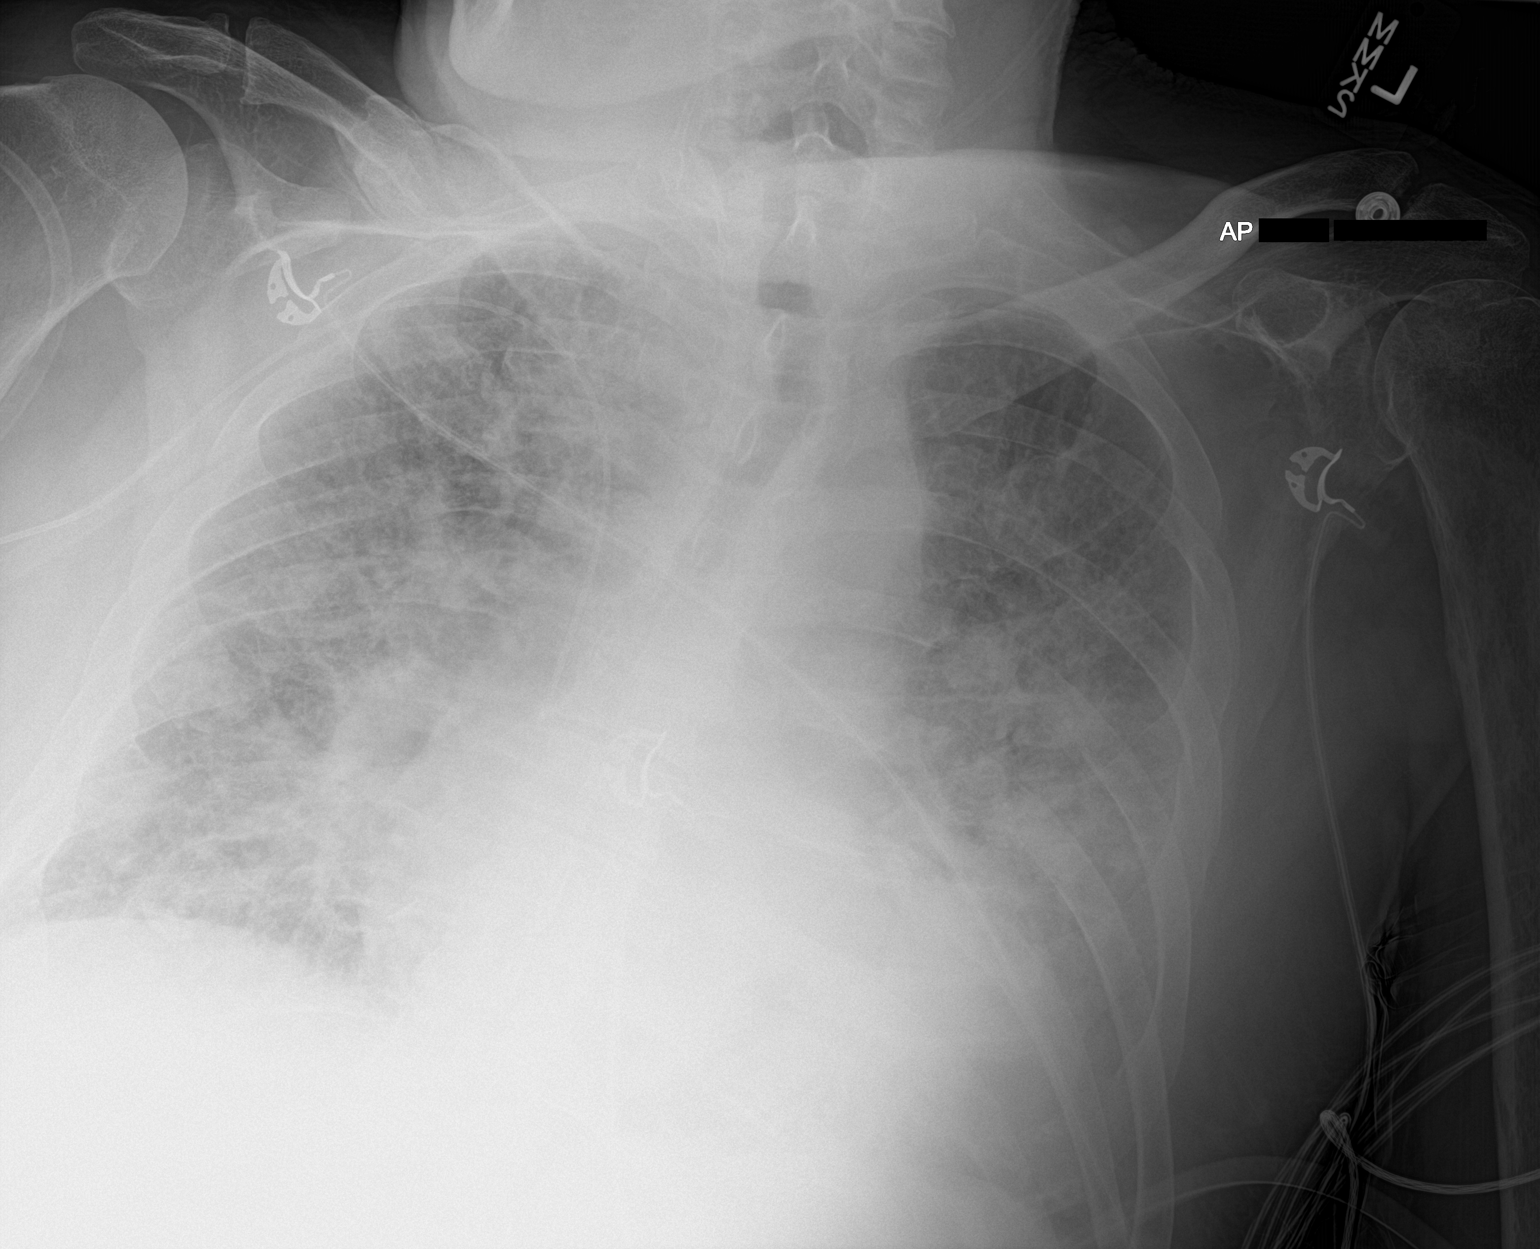

[1 of 1 positions shown; findings below may reference images not displayed]

FINDINGS: Stable cardiomegaly. Increased diffuse interstitial densities are
noted throughout both lungs most consistent with worsening edema or
pneumonia. Endotracheal tube has been removed. Right-sided PICC line
is unchanged in position. No pneumothorax is noted. No large pleural
effusion is noted. Bony thorax is unremarkable.
IMPRESSION: Endotracheal tube has been removed. Increased bilateral diffuse
interstitial densities are noted consistent with worsening edema or
pneumonia.

## 2019-11-17 IMAGING — DX DG CHEST 1V PORT
1 series · 1 of 1 positions shown · non-contrast
Comparison: Prior chest x-ray 09/29/2017

CLINICAL DATA: 61-year-old male with acute respiratory failure and
hypoxia

EXAM:
PORTABLE CHEST 1 VIEW

[chest ap]
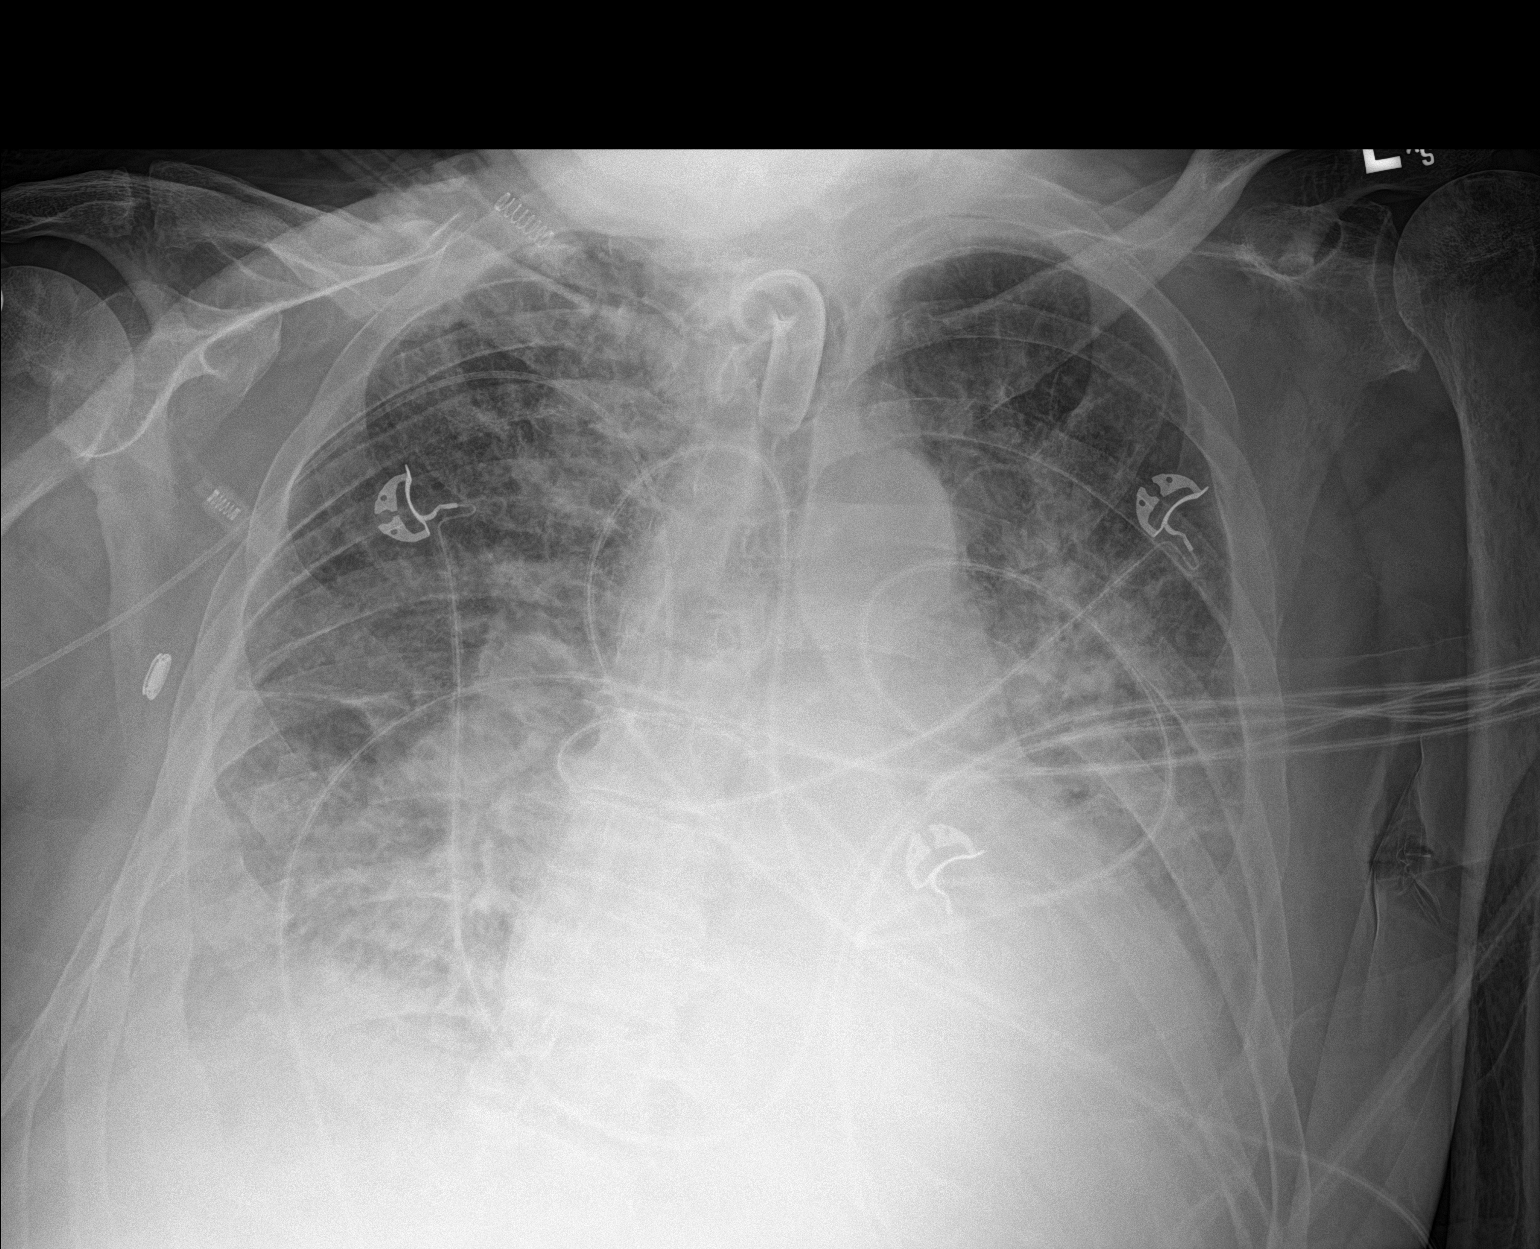

[1 of 1 positions shown; findings below may reference images not displayed]

FINDINGS: Interval tracheostomy. The tip of the tracheostomy tube is midline
and at the level of the clavicles. Stable position of right upper
extremity approach PICC. Catheter tip overlies the cavoatrial
junction. Similar degree of cardiomegaly. Persistent pulmonary
vascular congestion with diffuse bilateral interstitial and airspace
opacities. Bibasilar opacities are also present in remain consistent
with layering effusions and atelectasis. No evidence of acute
osseous abnormality.
IMPRESSION: 1. Interval tracheostomy. The tracheostomy tube he is in good
position.
2. Stable cardiomegaly, pulmonary edema and bilateral layering
pleural effusions and associated bibasilar atelectasis.

## 2019-11-19 IMAGING — DX DG CHEST 1V PORT
1 series · 1 of 1 positions shown · non-contrast
Comparison: 10/02/2017

CLINICAL DATA: 61-year-old male with a history of repositioned PICC

EXAM:
PORTABLE CHEST 1 VIEW

[chest ap]
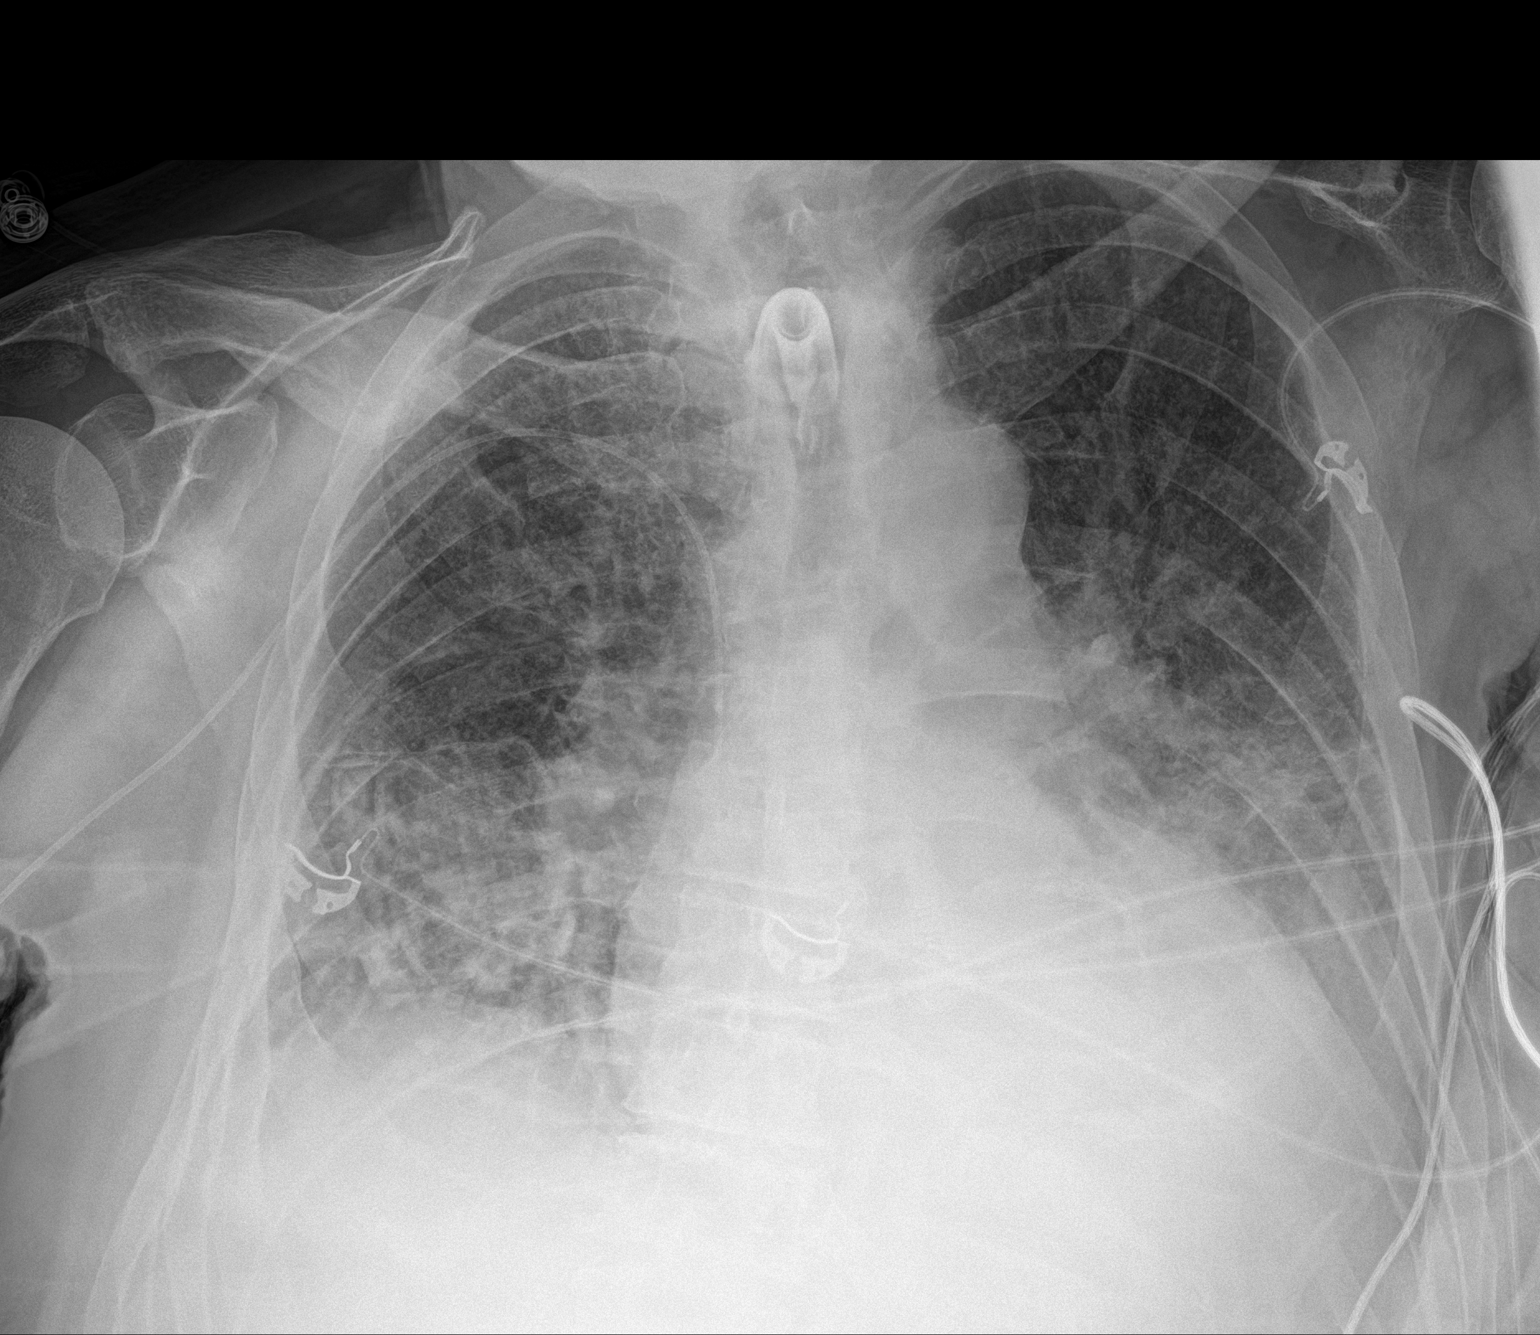

[1 of 1 positions shown; findings below may reference images not displayed]

FINDINGS: Cardiomediastinal silhouette unchanged. Fullness in the central
vasculature. Mixed airspace interstitial opacities in the bilateral
mid lungs and lower lungs with partial obscuration of the heart
borders and hemidiaphragm. Mild improvement in aeration compared to
the prior plain film

Unchanged tracheostomy.

Interval reposition of right upper extremity PICC, now with the tip
appearing to terminate superior vena cava.

No displaced fractures.
IMPRESSION: Interval repositioning of right upper extremity PICC, which now
appears to terminate in the superior vena cava.

Slight improvement in lung aeration, with persisting mixed
interstitial and airspace opacities at the bilateral mid and lower
lungs, likely with small pleural effusions.

Unchanged tracheostomy.

## 2019-11-19 IMAGING — DX DG CHEST 1V PORT
1 series · 1 of 1 positions shown · non-contrast
Comparison: 09/30/2017 and earlier.

ADDENDUM:
Study discussed by telephone with RN Naoko Szeto on 10/02/2017 at
4134 hours.

We discussed that if the right PICC line is necessary for any
continued period of time it should be repositioned or removed and
replaced to avoid complications to the right IJ.
CLINICAL DATA: 61-year-old male with respiratory failure. MRSA + C.
Perfringens Bacteremia.
EXAM:
PORTABLE CHEST 1 VIEW

[chest]
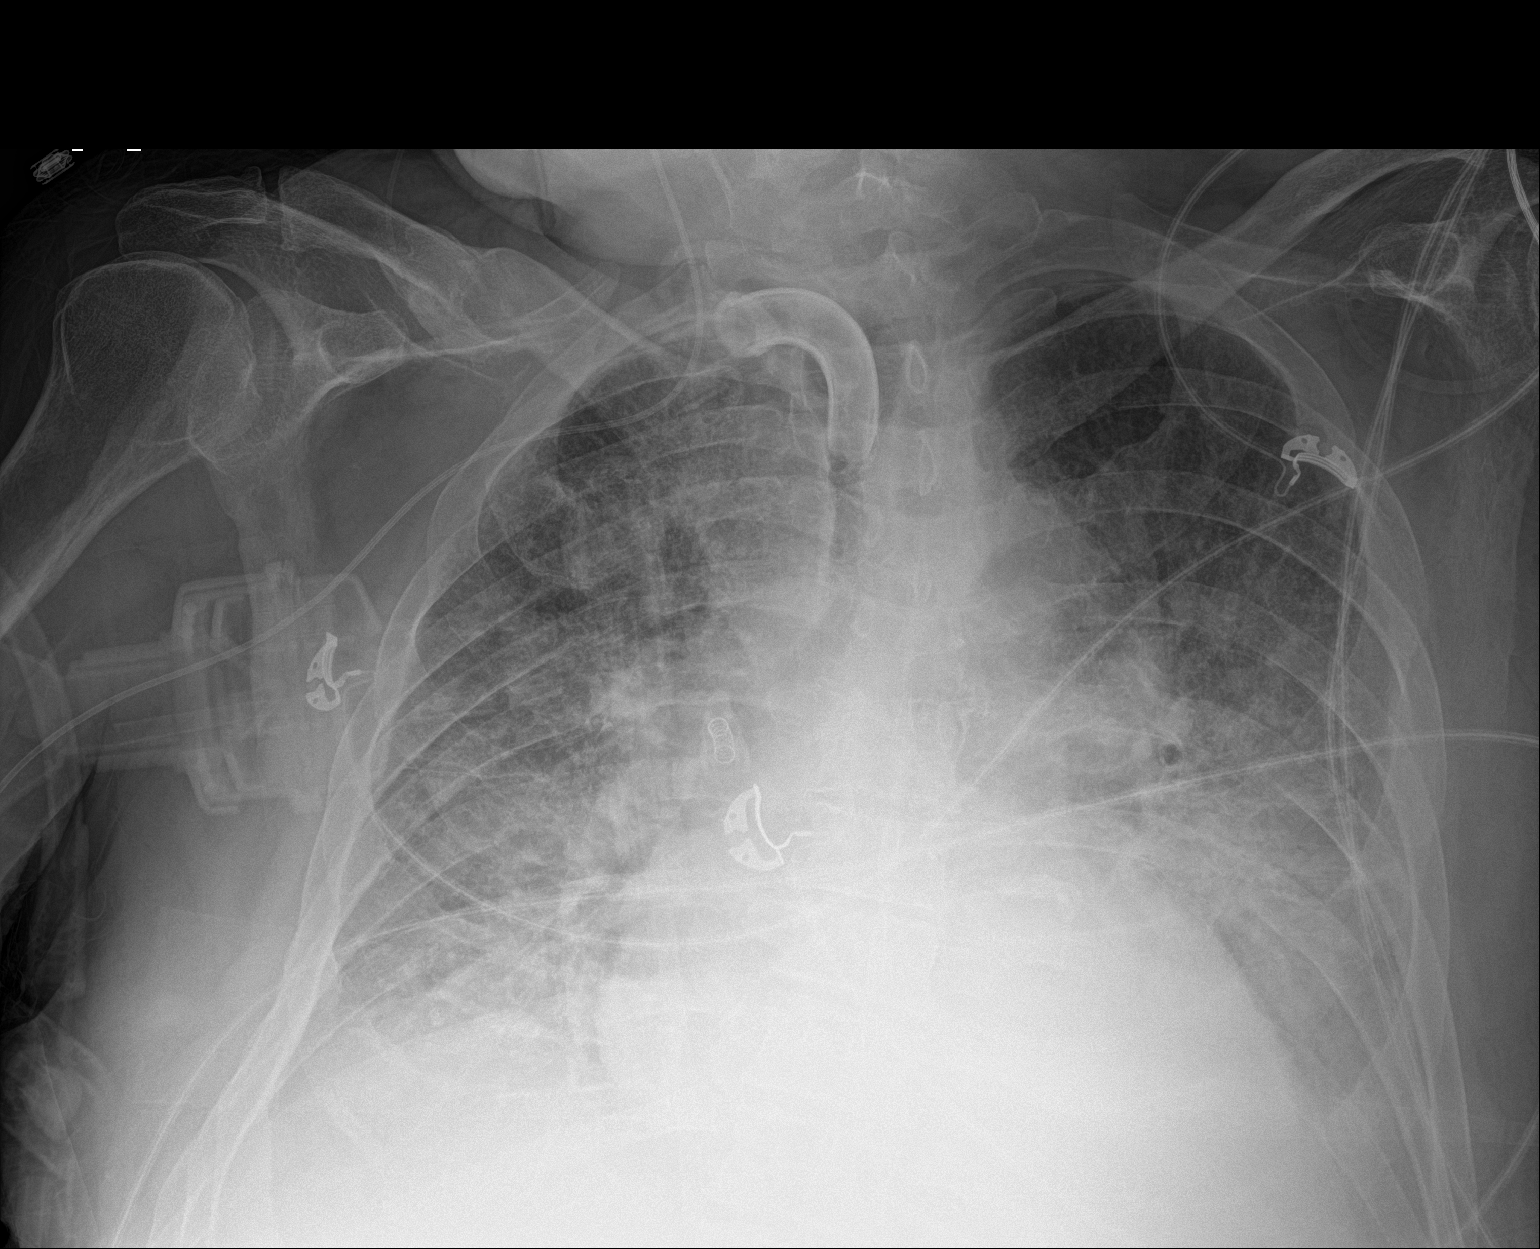

[1 of 1 positions shown; findings below may reference images not displayed]

FINDINGS: Portable AP semi upright view at 9910 hours. The patient is more
rotated to the right. Stable tracheostomy.

The right side PICC line has flipped cephalad into the right
internal jugular vein, tip in the mid right neck.

Bilateral veiling and coarse pulmonary opacity persists. Overall
ventilation stable since 09/30/2017. Dense retrocardiac opacity. No
pneumothorax.

Stable cardiac size and mediastinal contours.
IMPRESSION: 1. Right upper extremity approach PICC line has flipped cephalad
into the right IJ, tip in the right mid to upper neck.
2. Continued coarse and confluent bilateral pulmonary opacity.
Ventilation stable since 09/30/2017.

## 2019-11-20 IMAGING — DX DG CHEST 1V PORT
1 series · 1 of 1 positions shown · non-contrast
Comparison: October 02, 2017

CLINICAL DATA: Shortness of Breath

EXAM:
PORTABLE CHEST 1 VIEW

[chest ap]
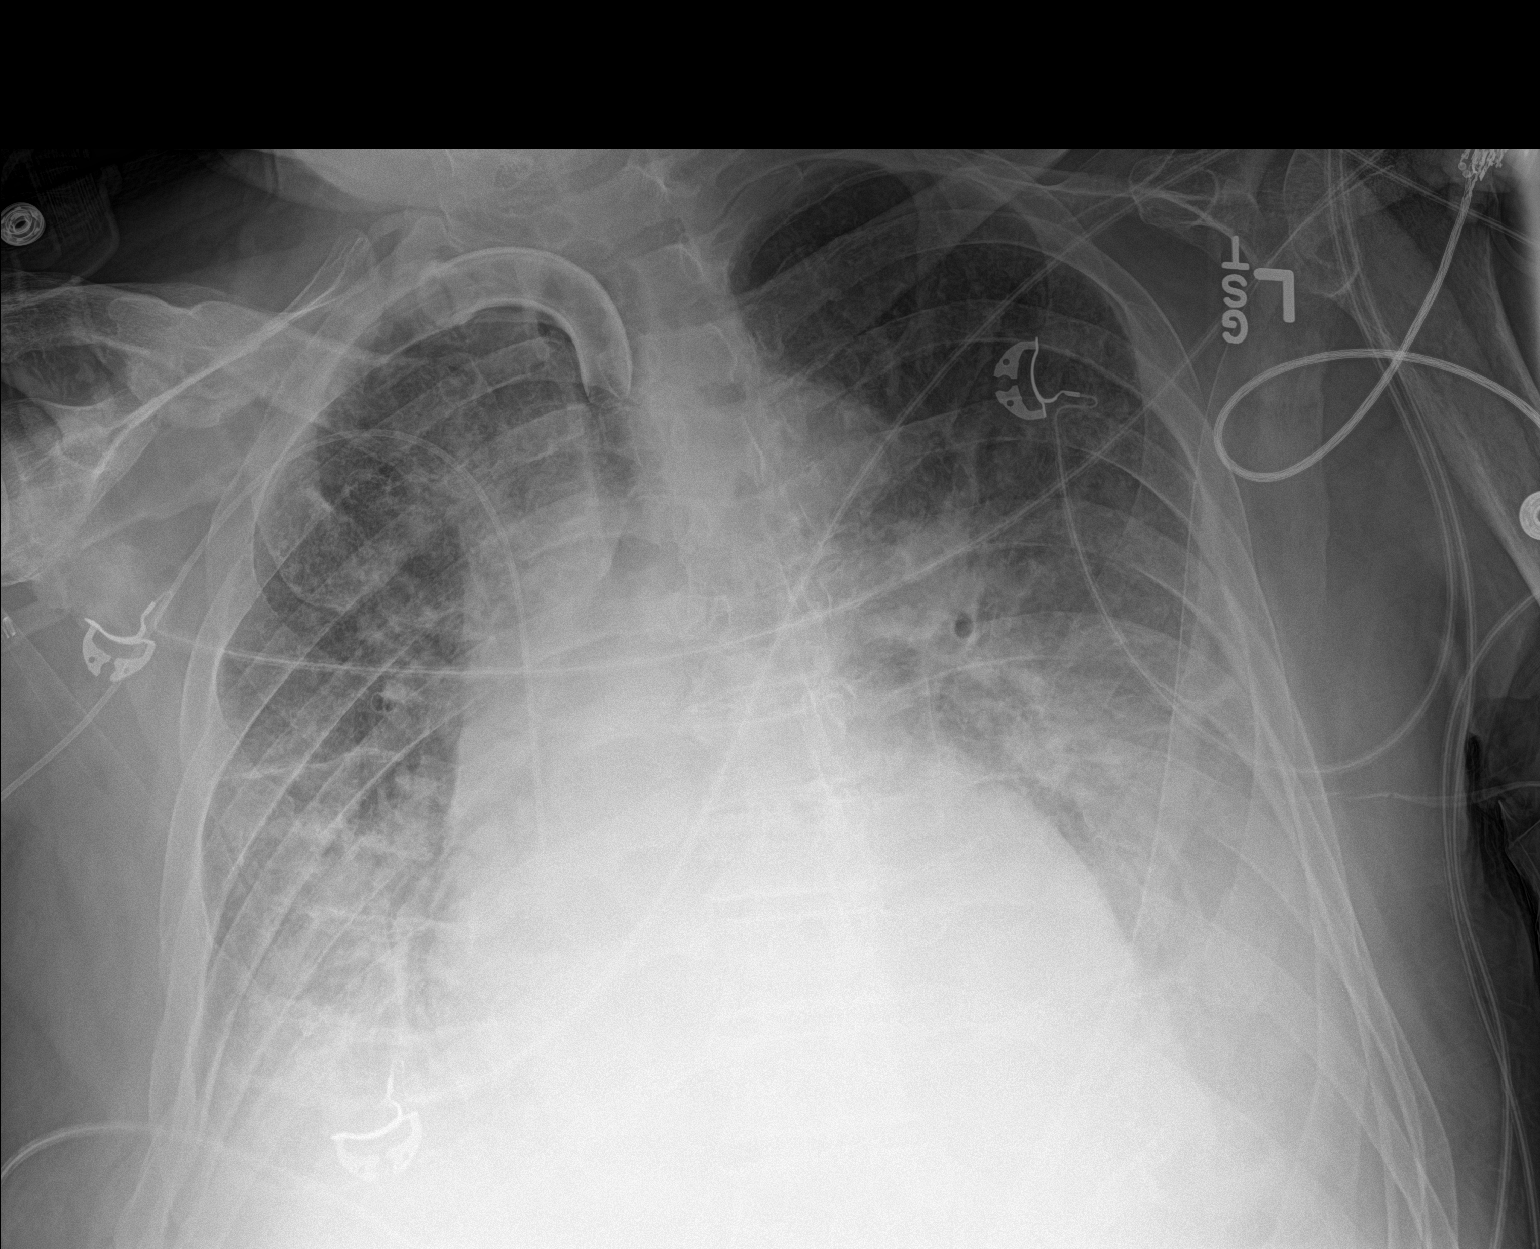

[1 of 1 positions shown; findings below may reference images not displayed]

FINDINGS: Tracheostomy catheter tip is 6.2 cm above the carina. Central
catheter tip is at the cavoatrial junction. No pneumothorax. There
are moderate pleural effusions bilaterally with interstitial edema
bilaterally. There is bibasilar atelectasis. There is cardiomegaly
with pulmonary venous hypertension. No adenopathy. There is an old
healed fracture of the right clavicle.
IMPRESSION: Tube and catheter positions as described without evident
pneumothorax. Pleural effusions with edema persist. Cardiomegaly
with pulmonary venous hypertension persists. Bibasilar atelectasis
is stable. No new opacity evident.

## 2019-11-24 IMAGING — DX DG CHEST 1V PORT
1 series · 1 of 1 positions shown · non-contrast
Comparison: 10/03/2017 and earlier.

CLINICAL DATA: 61-year-old male with fluid overload, CHF.

EXAM:
PORTABLE CHEST 1 VIEW

[chest ap]
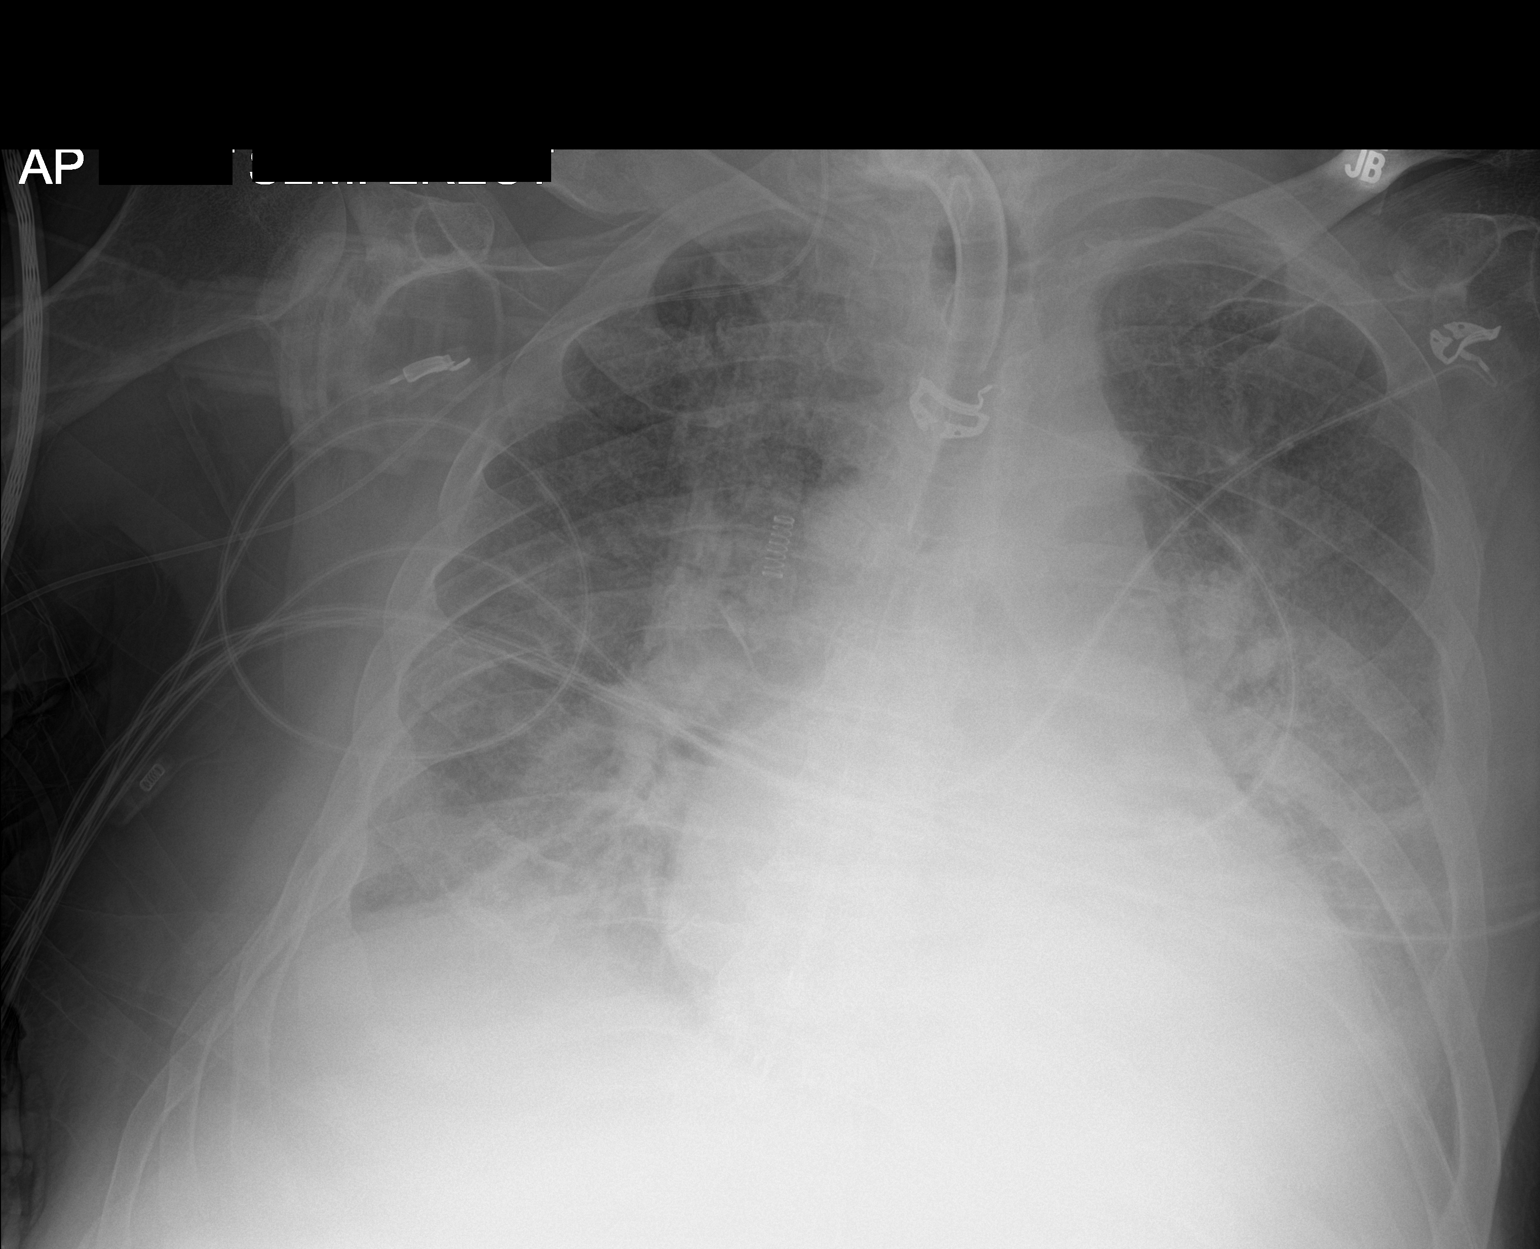

[1 of 1 positions shown; findings below may reference images not displayed]

FINDINGS: Portable AP semi upright view at 0976 hours. Stable tracheostomy
tube. However, the right upper extremity approach PICC line catheter
has flipped into the right IJ.

Stable cardiomegaly and mediastinal contours. Continued dense
retrocardiac opacity. Increased indistinct appearance of bilateral
pulmonary vascularity and mildly increased ground-glass type
bilateral pulmonary opacity. No pneumothorax. Small pleural
effusions suspected, greater on the left.
IMPRESSION: 1. Recurrent malpositioning of the right upper extremity PICC line,
flipped into the right IJ. This should be repositioned or replaced
to avoid right internal jugular vein complications.

2. Pulmonary edema appears mildly progressed since 10/03/2017.
Continued pleural effusions, greater on the left, and bilateral
lower lobe collapse or consolidation.
These results will be called to the ordering clinician or
representative by the Radiologist Assistant, and communication
documented in the PACS or zVision Dashboard.

## 2019-11-24 IMAGING — DX DG CHEST 1V PORT
1 series · 2 of 2 positions shown · non-contrast
Comparison: 10/07/2017

CLINICAL DATA: Check PICC line placement

EXAM:
PORTABLE CHEST 1 VIEW

[Series 1: chest · 0.14mm/px · 2 of 2 slices shown]
[im 1/2]
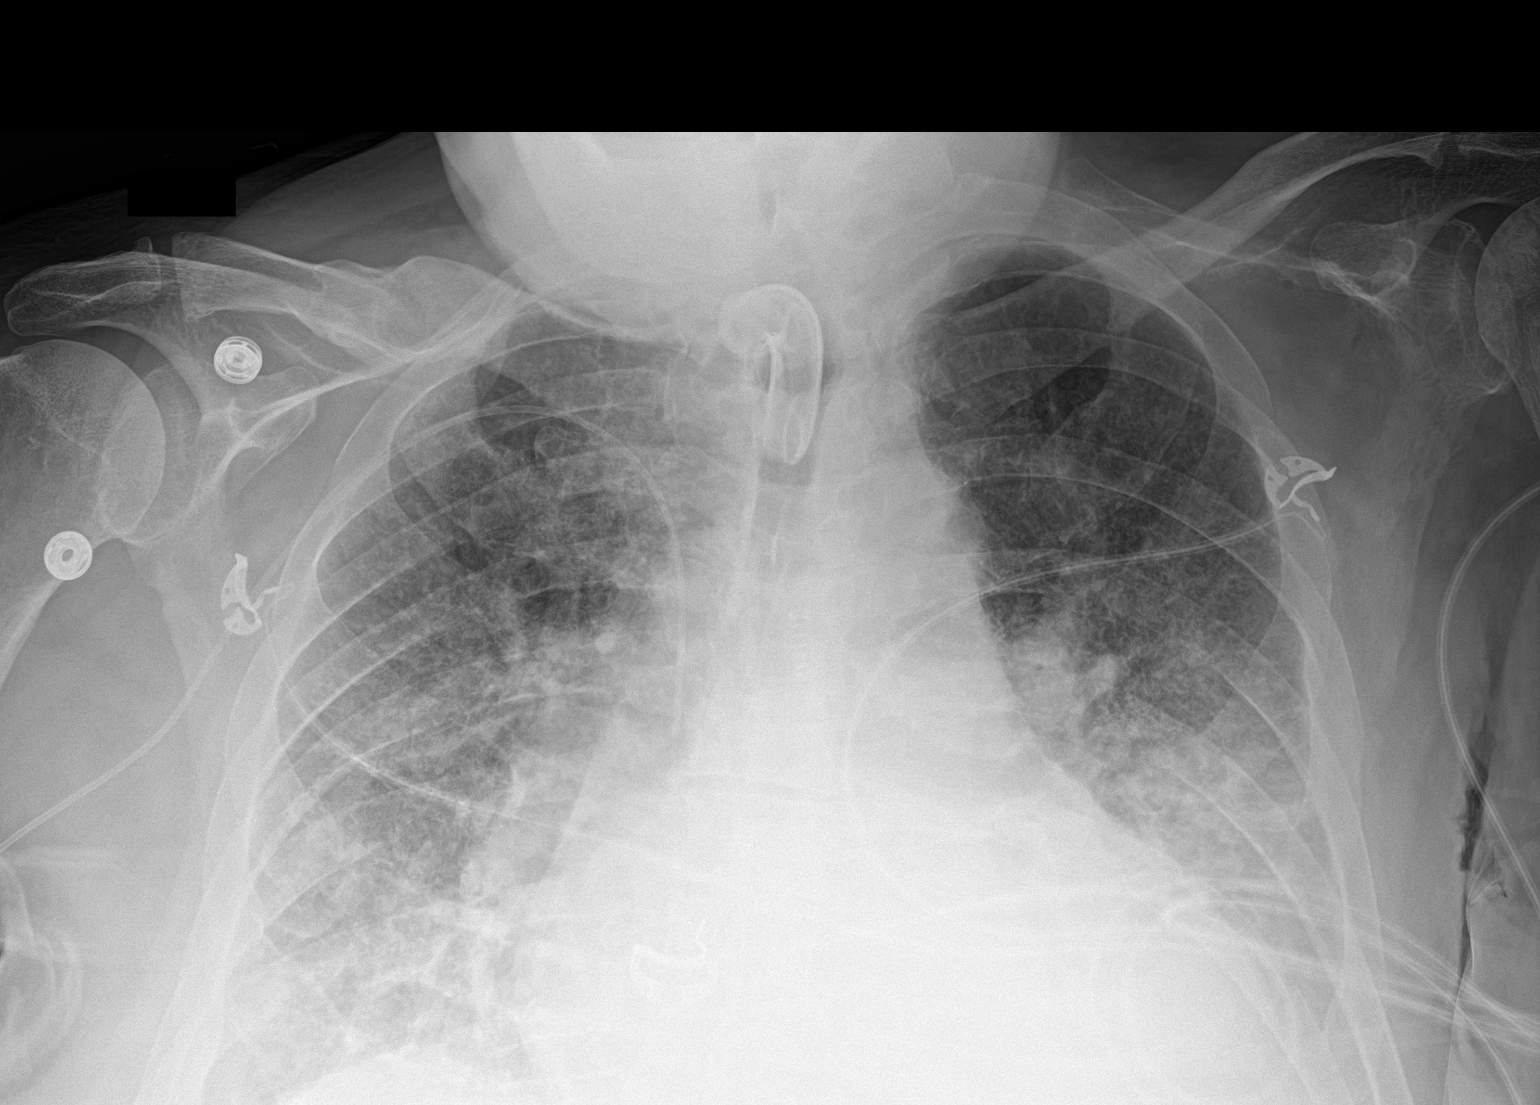
[im 2/2]
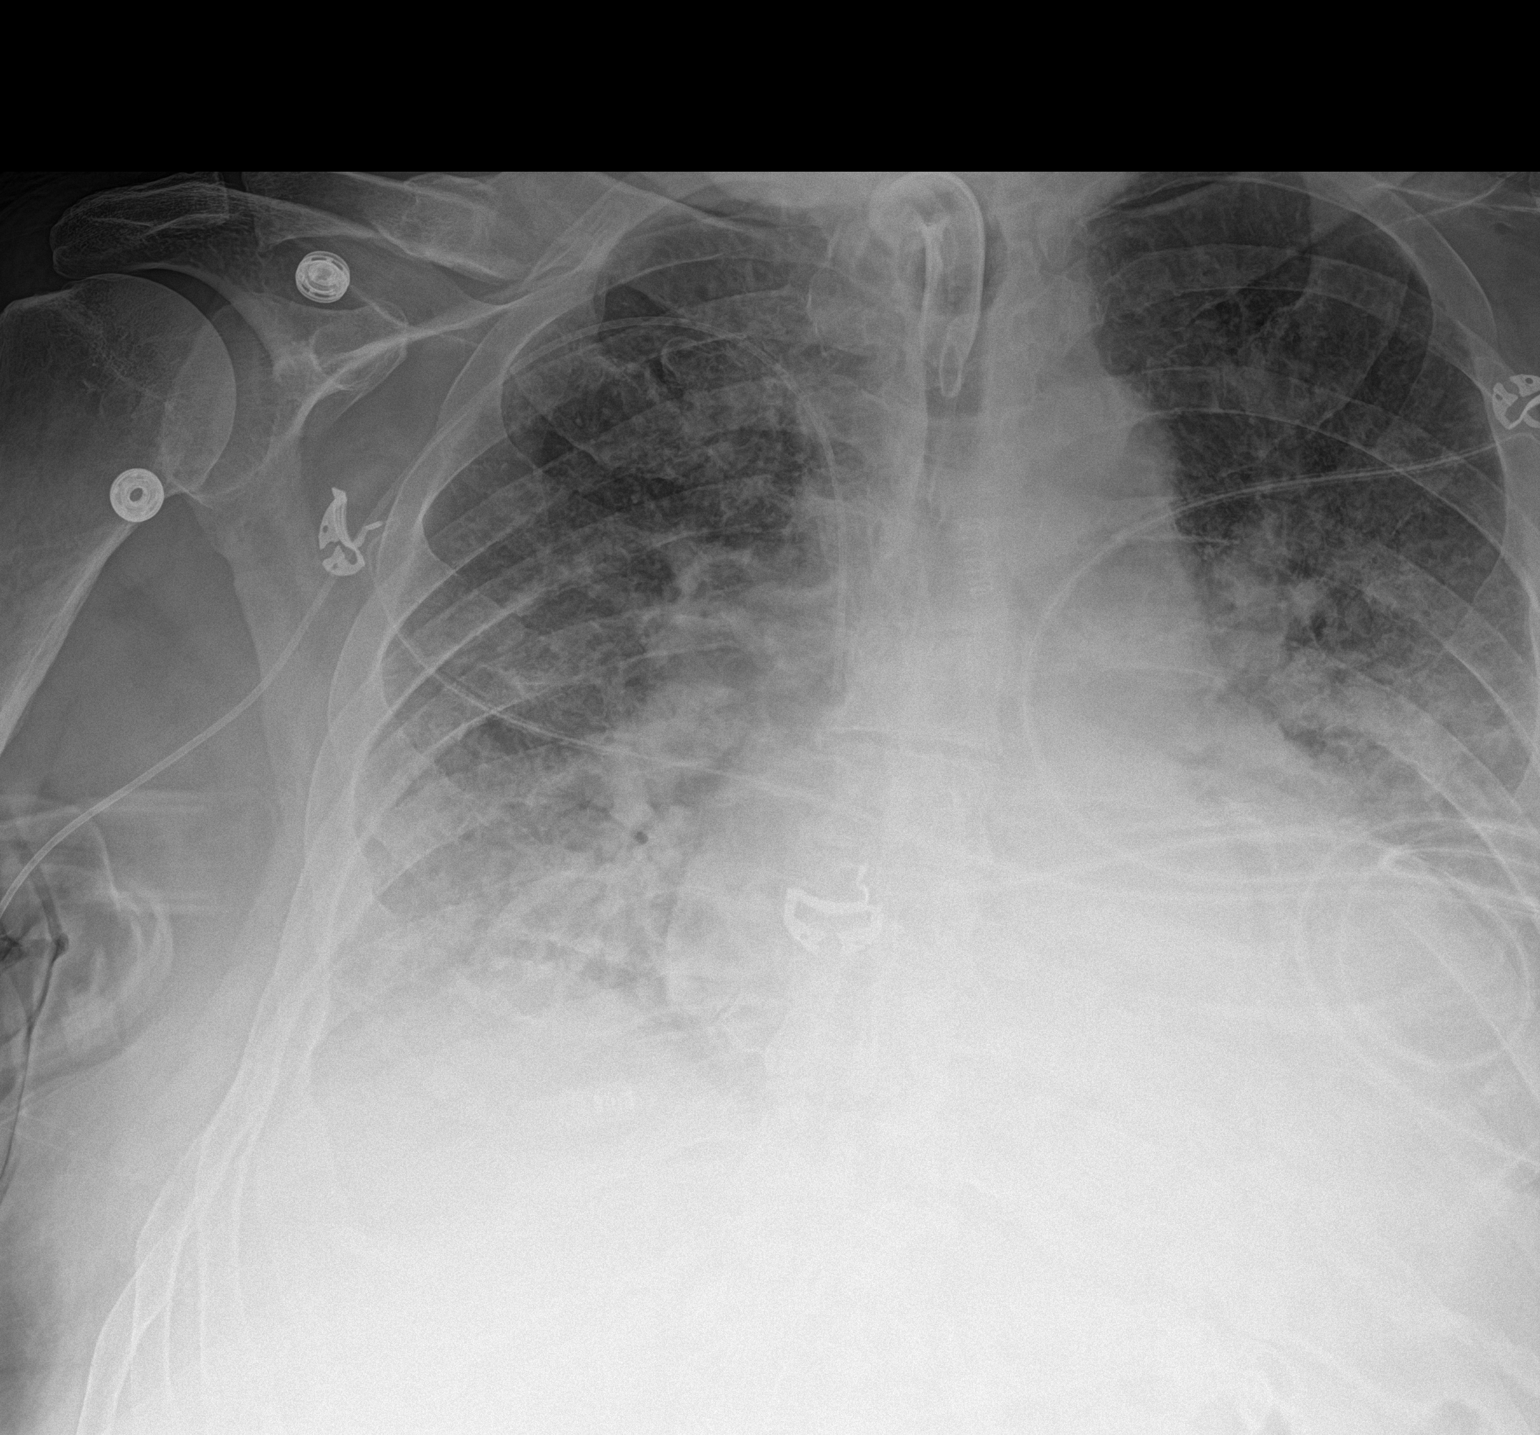

[2 of 2 positions shown; findings below may reference images not displayed]

FINDINGS: Previously placed PICC line has now been repositioned and lies at
the cavoatrial junction in satisfactory position. Remainder of the
exam is stable in appearance.
IMPRESSION: PICC line in satisfactory position. The remainder of the exam is
stable.

## 2019-11-25 IMAGING — CT CT CHEST W/O CM
2 of 4 series · 15 of 36 positions shown, 18 images · non-contrast
Comparison: Chest CT 03/29/2017.  Chest x-ray 10/07/2017.

CLINICAL DATA: Acute respiratory illness. Evaluate effusions and
for postobstructive process.

EXAM:
CT CHEST WITHOUT CONTRAST
TECHNIQUE: Multidetector CT imaging of the chest was performed following the
standard protocol without IV contrast.

[Series 3: chest wo · axial · 0.69mm/px · z∈[-161,+107]mm · 12 of 160 slices shown, 15 images]
[im 13/160  mediastinal]
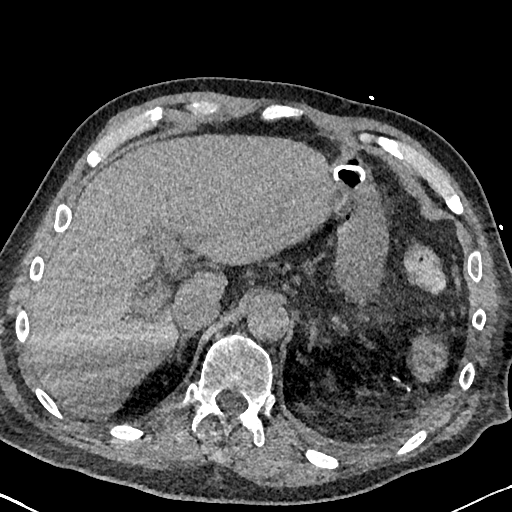
[im 13/160  lung]
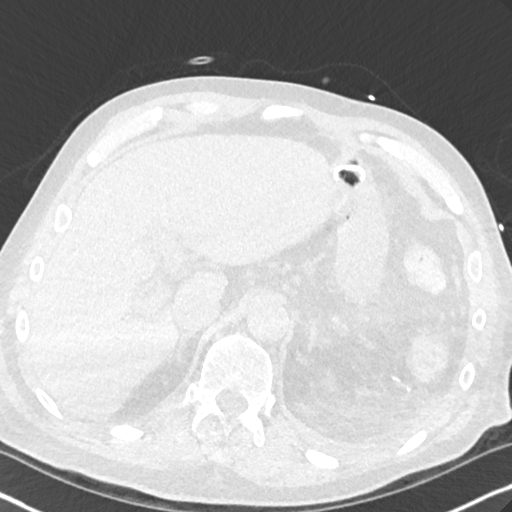
[im 25/160  lung]
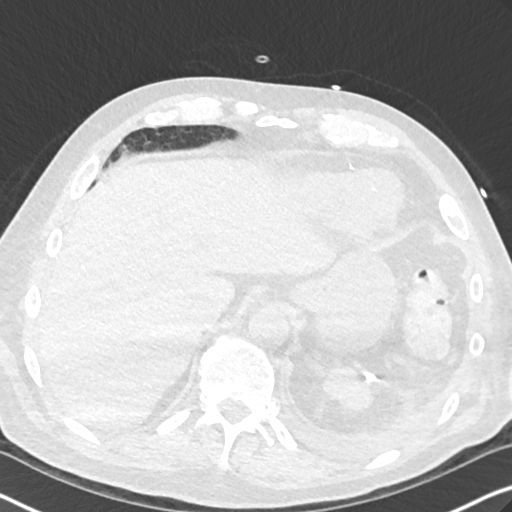
[im 37/160  lung]
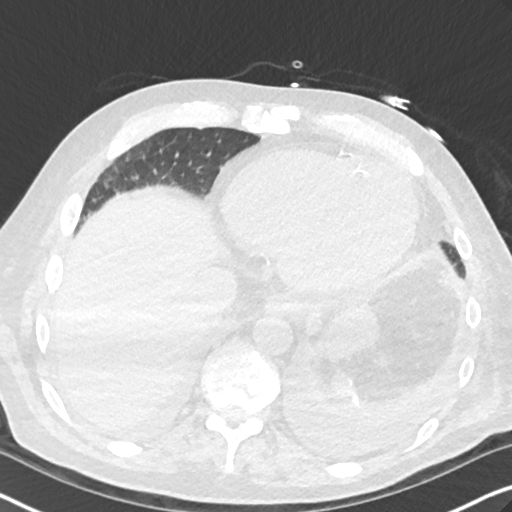
[im 49/160  lung]
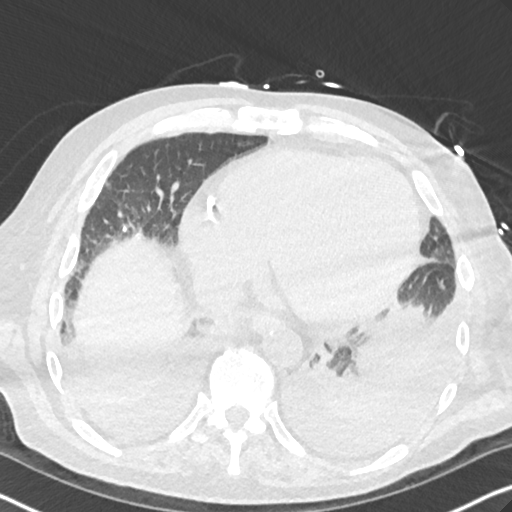
[im 62/160  mediastinal]
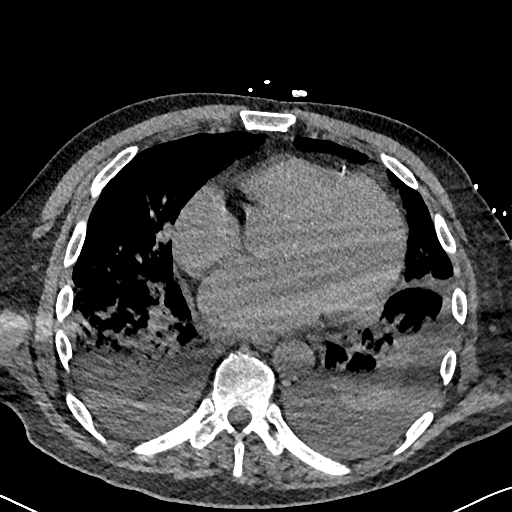
[im 62/160  lung]
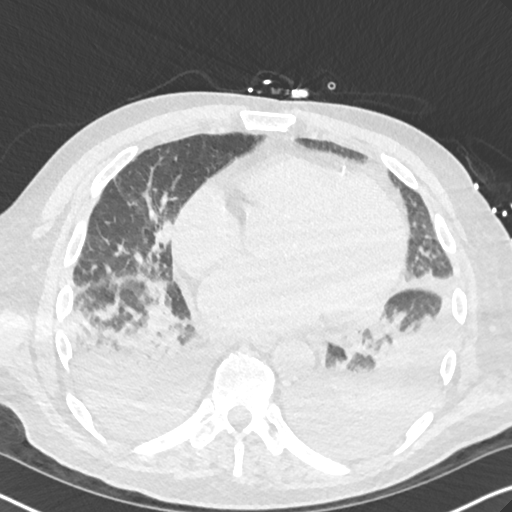
[im 74/160  lung]
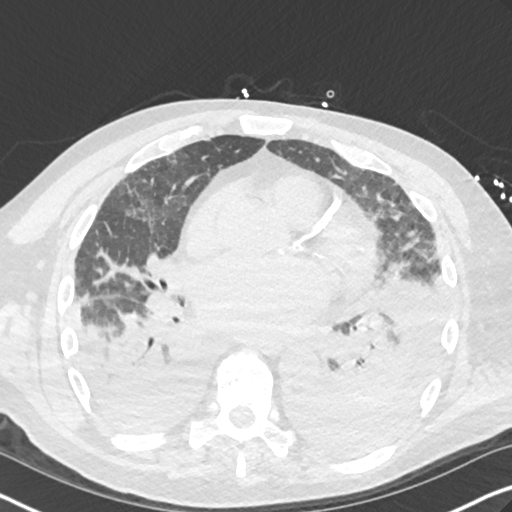
[im 86/160  lung]
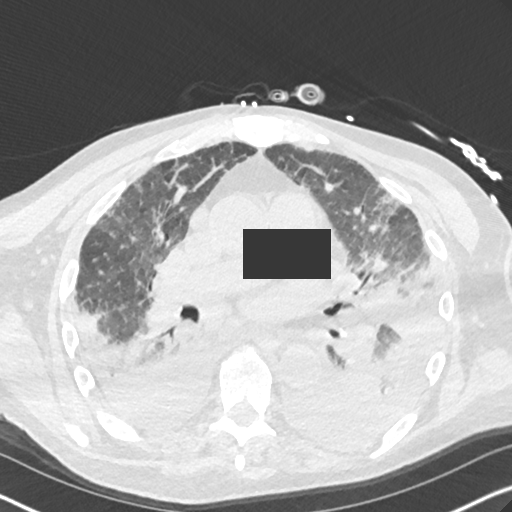
[im 98/160  lung]
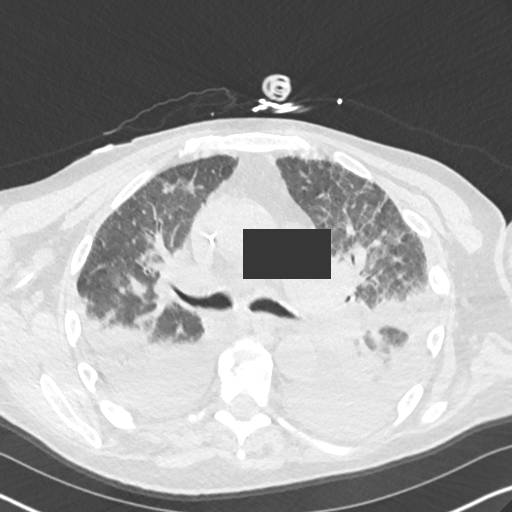
[im 111/160  mediastinal]
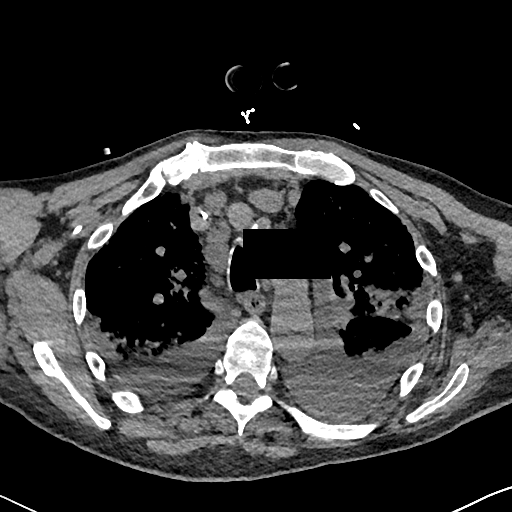
[im 111/160  lung]
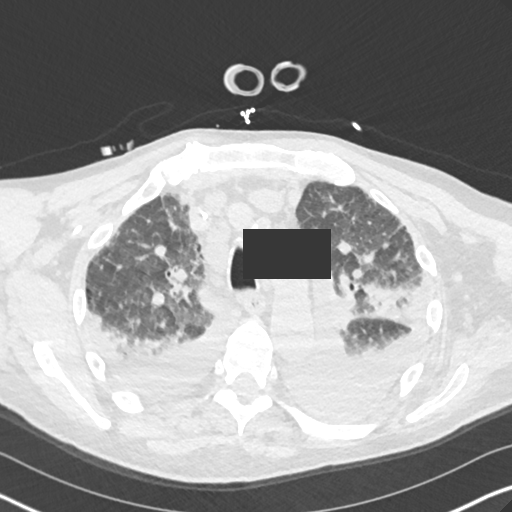
[im 123/160  lung]
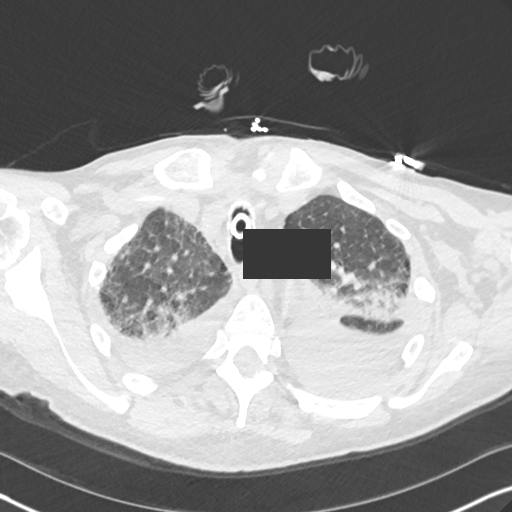
[im 135/160  lung]
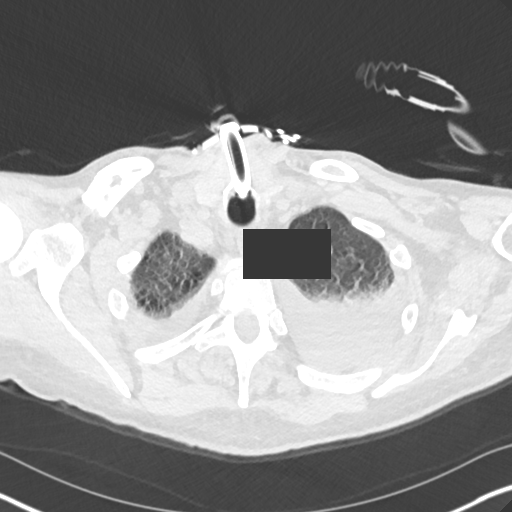
[im 147/160  lung]
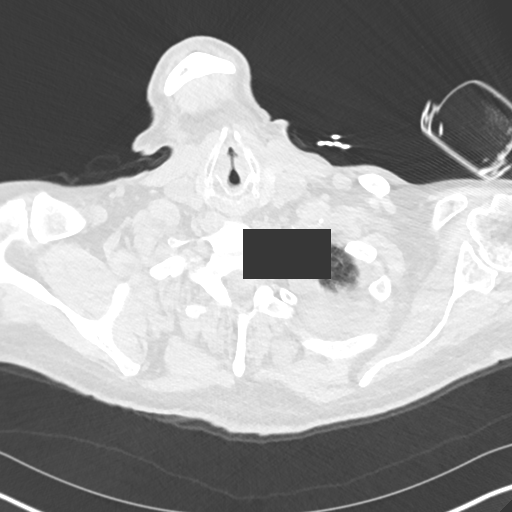

[Series 6: cor · coronal · 0.62mm/px · 3 of 151 slices shown]
[im 31/151  lung]
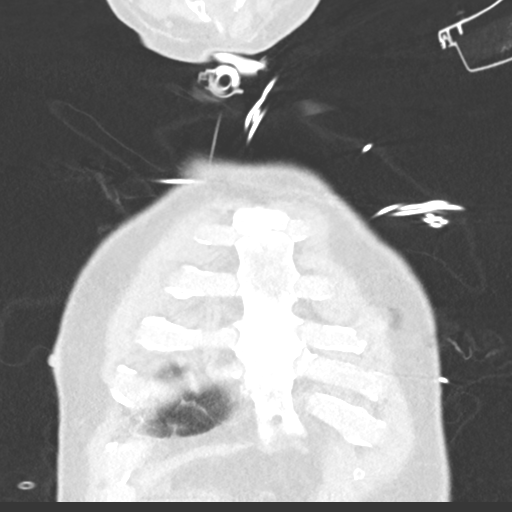
[im 61/151  lung]
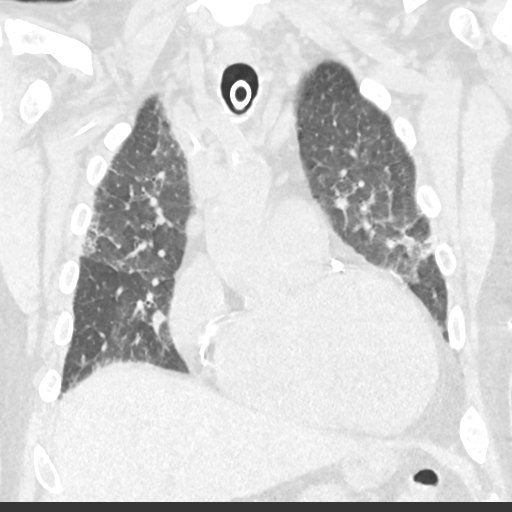
[im 91/151  lung]
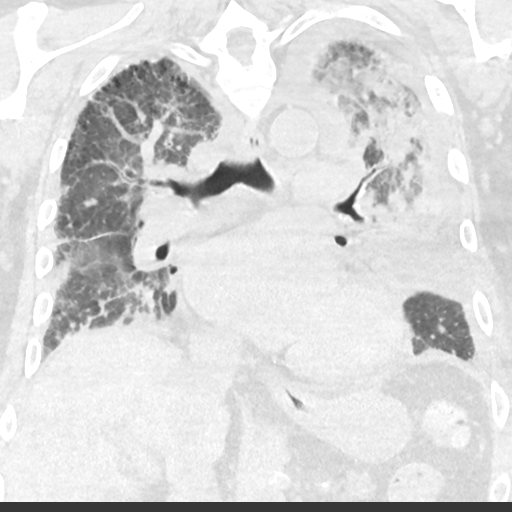

[15 of 36 positions shown; findings below may reference images not displayed]

FINDINGS: Cardiovascular: There is cardiomegaly. Diffuse coronary artery
calcifications. Scattered aortic calcifications. No evidence of
aortic aneurysm. Calcification in the left ventricular wall at the
apex compatible with old infarct.

Mediastinum/Nodes: Mediastinal adenopathy, increased since prior
chest CT. Right paratracheal lymph node measures 16 mm in short axis
diameter compared to 10 mm previously. Prevascular lymph node has a
short axis diameter of 14 mm compared to 10 mm previously. Increases
subcarinal adenopathy and AP window adenopathy. Cannot visualize the
********* sec cannot evaluate the hila without intravenous contrast.
No axillary adenopathy.

Lungs/Pleura: Large bilateral pleural effusions. Extensive bilateral
airspace disease in all lobes of both lungs, most confluent in the
lower lobes. Findings could reflect edema or infection. Tracheostomy
in place.

Upper Abdomen: Gastrostomy tube in the stomach.  No acute findings.

Musculoskeletal: Chest wall soft tissues are unremarkable. No acute
bony abnormality.
IMPRESSION: Large bilateral pleural effusions. Severe bilateral airspace disease
throughout all lobes of both lungs, most pronounced in the lower
lobes. Findings could reflect CHF or infection/ pneumonia.

Diffuse coronary artery disease.

Cardiomegaly.

Mediastinal adenopathy, worsening since prior CT 03/29/2017.
